# Patient Record
Sex: Male | Born: 1955 | Race: White | Marital: Married | Smoking: Former smoker
Health system: Southern US, Community
[De-identification: ages and names within clinical notes are randomized; demographics above are authoritative.]

## PROBLEM LIST (undated history)

## (undated) DIAGNOSIS — K219 Gastro-esophageal reflux disease without esophagitis: Secondary | ICD-10-CM

## (undated) DIAGNOSIS — F419 Anxiety disorder, unspecified: Secondary | ICD-10-CM

## (undated) DIAGNOSIS — I471 Supraventricular tachycardia, unspecified: Secondary | ICD-10-CM

## (undated) DIAGNOSIS — R0600 Dyspnea, unspecified: Secondary | ICD-10-CM

## (undated) DIAGNOSIS — D649 Anemia, unspecified: Secondary | ICD-10-CM

## (undated) DIAGNOSIS — I739 Peripheral vascular disease, unspecified: Secondary | ICD-10-CM

## (undated) DIAGNOSIS — I10 Essential (primary) hypertension: Secondary | ICD-10-CM

## (undated) DIAGNOSIS — D62 Acute posthemorrhagic anemia: Secondary | ICD-10-CM

## (undated) DIAGNOSIS — I509 Heart failure, unspecified: Secondary | ICD-10-CM

## (undated) DIAGNOSIS — T8611 Kidney transplant rejection: Secondary | ICD-10-CM

## (undated) DIAGNOSIS — G4730 Sleep apnea, unspecified: Secondary | ICD-10-CM

## (undated) DIAGNOSIS — Z951 Presence of aortocoronary bypass graft: Secondary | ICD-10-CM

## (undated) DIAGNOSIS — I779 Disorder of arteries and arterioles, unspecified: Secondary | ICD-10-CM

## (undated) DIAGNOSIS — N186 End stage renal disease: Secondary | ICD-10-CM

## (undated) DIAGNOSIS — G35 Multiple sclerosis: Secondary | ICD-10-CM

## (undated) DIAGNOSIS — J9610 Chronic respiratory failure, unspecified whether with hypoxia or hypercapnia: Secondary | ICD-10-CM

## (undated) DIAGNOSIS — I480 Paroxysmal atrial fibrillation: Secondary | ICD-10-CM

## (undated) DIAGNOSIS — I358 Other nonrheumatic aortic valve disorders: Secondary | ICD-10-CM

## (undated) DIAGNOSIS — I452 Bifascicular block: Secondary | ICD-10-CM

## (undated) DIAGNOSIS — C801 Malignant (primary) neoplasm, unspecified: Secondary | ICD-10-CM

## (undated) DIAGNOSIS — J449 Chronic obstructive pulmonary disease, unspecified: Secondary | ICD-10-CM

## (undated) DIAGNOSIS — J189 Pneumonia, unspecified organism: Secondary | ICD-10-CM

## (undated) DIAGNOSIS — Z9981 Dependence on supplemental oxygen: Secondary | ICD-10-CM

## (undated) DIAGNOSIS — I2510 Atherosclerotic heart disease of native coronary artery without angina pectoris: Secondary | ICD-10-CM

## (undated) DIAGNOSIS — I219 Acute myocardial infarction, unspecified: Secondary | ICD-10-CM

## (undated) DIAGNOSIS — T8612 Kidney transplant failure: Secondary | ICD-10-CM

## (undated) DIAGNOSIS — T148XXA Other injury of unspecified body region, initial encounter: Secondary | ICD-10-CM

## (undated) DIAGNOSIS — G4734 Idiopathic sleep related nonobstructive alveolar hypoventilation: Secondary | ICD-10-CM

## (undated) DIAGNOSIS — I38 Endocarditis, valve unspecified: Secondary | ICD-10-CM

## (undated) DIAGNOSIS — I4892 Unspecified atrial flutter: Secondary | ICD-10-CM

## (undated) HISTORY — DX: Peripheral vascular disease, unspecified: I73.9

## (undated) HISTORY — DX: Supraventricular tachycardia, unspecified: I47.10

## (undated) HISTORY — DX: Kidney transplant rejection: T86.11

## (undated) HISTORY — DX: Disorder of arteries and arterioles, unspecified: I77.9

## (undated) HISTORY — PX: AV FISTULA PLACEMENT: SHX1204

## (undated) HISTORY — DX: Unspecified atrial flutter: I48.92

## (undated) HISTORY — PX: KNEE SURGERY: SHX244

## (undated) HISTORY — DX: Paroxysmal atrial fibrillation: I48.0

## (undated) HISTORY — PX: COLONOSCOPY: SHX174

## (undated) HISTORY — DX: Anemia, unspecified: D64.9

## (undated) HISTORY — DX: Kidney transplant failure: T86.12

## (undated) HISTORY — PX: PARATHYROIDECTOMY: SHX19

## (undated) HISTORY — DX: Supraventricular tachycardia: I47.1

## (undated) HISTORY — PX: CORONARY ANGIOPLASTY: SHX604

## (undated) HISTORY — PX: APPENDECTOMY: SHX54

## (undated) HISTORY — PX: OTHER SURGICAL HISTORY: SHX169

## (undated) HISTORY — PX: ESOPHAGOGASTRODUODENOSCOPY ENDOSCOPY: SHX5814

## (undated) HISTORY — DX: Chronic respiratory failure, unspecified whether with hypoxia or hypercapnia: J96.10

## (undated) HISTORY — DX: Endocarditis, valve unspecified: I38

## (undated) HISTORY — PX: CORONARY ARTERY BYPASS GRAFT: SHX141

---

## 2004-09-06 HISTORY — PX: OTHER SURGICAL HISTORY: SHX169

## 2007-08-07 HISTORY — PX: KIDNEY TRANSPLANT: SHX239

## 2010-07-02 ENCOUNTER — Ambulatory Visit: Payer: Self-pay | Admitting: Vascular Surgery

## 2010-07-28 ENCOUNTER — Ambulatory Visit: Payer: Self-pay | Admitting: Vascular Surgery

## 2011-01-11 ENCOUNTER — Ambulatory Visit (HOSPITAL_COMMUNITY)
Admission: AD | Admit: 2011-01-11 | Discharge: 2011-01-11 | Disposition: A | Discharge status: Home / Self Care | Payer: Medicare Other | Source: Other Acute Inpatient Hospital | Attending: Vascular Surgery | Admitting: Vascular Surgery

## 2011-01-11 DIAGNOSIS — I509 Heart failure, unspecified: Secondary | ICD-10-CM | POA: Insufficient documentation

## 2011-01-11 DIAGNOSIS — J9600 Acute respiratory failure, unspecified whether with hypoxia or hypercapnia: Secondary | ICD-10-CM | POA: Insufficient documentation

## 2011-01-19 DIAGNOSIS — I501 Left ventricular failure: Secondary | ICD-10-CM | POA: Insufficient documentation

## 2011-01-19 NOTE — Consult Note (Signed)
NEW PATIENT CONSULTATION   [name], [name]  DOB:  09-08-1955                                       07/28/2010  IONGE#:[id number]   The patient presents today for concern regarding an open wound in his  right pretibial area.  He reports this has been present since August of  this year.  He initially struck in this pretibial area and developed an  open wound.  He subsequently has struck his shin somewhat above this  below his knee and does have a slight eschar but no open wound  currently.  He does not have any lower extremity claudication or rest  pain.  He has been treated at the [facility] for  this open area.  He.  Does have some mild stinging discomfort with this  but this is not severe.  He had been initially treated with oral  antibiotics for some surrounding erythema but is not on antibiotics  currently.  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  He dialyzes on Monday,  Wednesday, Friday via a left Cimino AV fistula.   PAST HISTORY:  Otherwise unremarkable.   SOCIAL HISTORY:  He is disabled, married with one 1 child.  He quit  smoking in 2000.  Does not drink alcohol.   FAMILY HISTORY:  Negative for premature atherosclerotic disease.   REVIEW OF SYSTEMS:  CONSTITUTIONAL:  Positive for no weight loss or  gain.  He weighs 195 pounds.  He is 5 feet 11 inches tall.  CARDIAC:  Positive for shortness breath with exertion.  PULMONARY:  He is on home oxygen.  URINARY:  For kidney failure.  His review of systems is otherwise negative except per the HPI.   PHYSICAL EXAMINATION:  A well-nourished, well-nourished white male  appearing stated age of 78, in no acute distress.  Heart rate is 53,  blood pressure is 112/53 in the right arm.  He has a fistula in his left  arm.  Temperature is 98.2, respirations 16.  HEENT:  Normal.  He has a 2+ right radial pulse.  He has an excellent  development of a fistula throughout his left forearm with no evidence of  skin breakdown.  MUSCULOSKELETAL:  No major deformities or cyanosis.  He does have  extensive surgery in his left knee.  SKIN:  Noted for hemosiderin deposits from past swelling in his left  calf.  He does have an open 2-cm ulceration the pretibial area above his  ankle with a good granulating base and no surrounding erythema.  NEUROLOGIC:  No focal weakness or paresthesias.  His pulse status is 2+  femoral, 2+ popliteal and 2+ dorsalis pedis bilaterally.   He had undergone noninvasive vascular studies in our office on July 02, 2010.  I reviewed this with the patient and his wife.  He does have  noncompressible vessels on the right with normal ABIs on the left.  He  does have triphasic waveforms bilaterally.  I explained to the patient  that he does have normal pulses and normal waveforms; therefore, I feel  that he certainly does have adequate flows for healing.  There is no  indication to proceed with arteriogram or other invasive evaluation  since he has essentially normal flow to the level of the tibial  vessels  in his foot.  I did explain with his chronic renal insufficiency that in  all likelihood has some diminished flow to the skin level and at that we  can expect slow healing for his wound but do not feel that any other  treatment is warranted.  He will continue his appropriate wound care as  is being done at [facility] and see [place] on a p.r.n. basis.     Larina Earthly, M.D.  Electronically Signed   TFE/MEDQ  D:  07/28/2010  T:  07/29/2010  Job:  [id number]   cc:   Loletha Carrow, MD  [facility]  [facility]

## 2011-07-08 HISTORY — PX: HERNIA REPAIR: SHX51

## 2012-02-12 ENCOUNTER — Emergency Department (HOSPITAL_COMMUNITY): Payer: Medicare Other

## 2012-02-12 ENCOUNTER — Encounter (HOSPITAL_COMMUNITY): Payer: Self-pay | Admitting: *Deleted

## 2012-02-12 ENCOUNTER — Inpatient Hospital Stay (HOSPITAL_COMMUNITY)
Admission: EM | Admit: 2012-02-12 | Discharge: 2012-02-15 | DRG: 871 | Disposition: A | Discharge status: Home / Self Care | Payer: Medicare Other | Attending: Family Medicine | Admitting: Family Medicine

## 2012-02-12 DIAGNOSIS — N2581 Secondary hyperparathyroidism of renal origin: Secondary | ICD-10-CM | POA: Diagnosis present

## 2012-02-12 DIAGNOSIS — I351 Nonrheumatic aortic (valve) insufficiency: Secondary | ICD-10-CM | POA: Diagnosis present

## 2012-02-12 DIAGNOSIS — N186 End stage renal disease: Secondary | ICD-10-CM

## 2012-02-12 DIAGNOSIS — I959 Hypotension, unspecified: Secondary | ICD-10-CM

## 2012-02-12 DIAGNOSIS — D696 Thrombocytopenia, unspecified: Secondary | ICD-10-CM | POA: Diagnosis present

## 2012-02-12 DIAGNOSIS — A419 Sepsis, unspecified organism: Principal | ICD-10-CM | POA: Diagnosis present

## 2012-02-12 DIAGNOSIS — E860 Dehydration: Secondary | ICD-10-CM | POA: Diagnosis present

## 2012-02-12 DIAGNOSIS — R197 Diarrhea, unspecified: Secondary | ICD-10-CM | POA: Diagnosis present

## 2012-02-12 DIAGNOSIS — T8611 Kidney transplant rejection: Secondary | ICD-10-CM

## 2012-02-12 DIAGNOSIS — R55 Syncope and collapse: Secondary | ICD-10-CM

## 2012-02-12 DIAGNOSIS — A0472 Enterocolitis due to Clostridium difficile, not specified as recurrent: Secondary | ICD-10-CM | POA: Diagnosis present

## 2012-02-12 DIAGNOSIS — G35D Multiple sclerosis, unspecified: Secondary | ICD-10-CM | POA: Diagnosis present

## 2012-02-12 DIAGNOSIS — A779 Spotted fever, unspecified: Secondary | ICD-10-CM | POA: Diagnosis present

## 2012-02-12 DIAGNOSIS — D649 Anemia, unspecified: Secondary | ICD-10-CM | POA: Diagnosis present

## 2012-02-12 DIAGNOSIS — I739 Peripheral vascular disease, unspecified: Secondary | ICD-10-CM | POA: Diagnosis present

## 2012-02-12 DIAGNOSIS — G4730 Sleep apnea, unspecified: Secondary | ICD-10-CM | POA: Diagnosis present

## 2012-02-12 DIAGNOSIS — I359 Nonrheumatic aortic valve disorder, unspecified: Secondary | ICD-10-CM

## 2012-02-12 DIAGNOSIS — Z7982 Long term (current) use of aspirin: Secondary | ICD-10-CM

## 2012-02-12 DIAGNOSIS — A413 Sepsis due to Hemophilus influenzae: Secondary | ICD-10-CM

## 2012-02-12 DIAGNOSIS — Z79899 Other long term (current) drug therapy: Secondary | ICD-10-CM

## 2012-02-12 DIAGNOSIS — R531 Weakness: Secondary | ICD-10-CM

## 2012-02-12 DIAGNOSIS — Z992 Dependence on renal dialysis: Secondary | ICD-10-CM | POA: Insufficient documentation

## 2012-02-12 DIAGNOSIS — I2510 Atherosclerotic heart disease of native coronary artery without angina pectoris: Secondary | ICD-10-CM | POA: Diagnosis present

## 2012-02-12 DIAGNOSIS — I4891 Unspecified atrial fibrillation: Secondary | ICD-10-CM | POA: Diagnosis present

## 2012-02-12 DIAGNOSIS — T8612 Kidney transplant failure: Secondary | ICD-10-CM | POA: Diagnosis present

## 2012-02-12 DIAGNOSIS — I120 Hypertensive chronic kidney disease with stage 5 chronic kidney disease or end stage renal disease: Secondary | ICD-10-CM | POA: Diagnosis present

## 2012-02-12 DIAGNOSIS — R509 Fever, unspecified: Secondary | ICD-10-CM | POA: Diagnosis present

## 2012-02-12 DIAGNOSIS — G35 Multiple sclerosis: Secondary | ICD-10-CM | POA: Diagnosis present

## 2012-02-12 HISTORY — DX: Multiple sclerosis: G35

## 2012-02-12 HISTORY — DX: Peripheral vascular disease, unspecified: I73.9

## 2012-02-12 HISTORY — DX: End stage renal disease: N18.6

## 2012-02-12 HISTORY — DX: Other nonrheumatic aortic valve disorders: I35.8

## 2012-02-12 HISTORY — DX: Sleep apnea, unspecified: G47.30

## 2012-02-12 HISTORY — DX: Atherosclerotic heart disease of native coronary artery without angina pectoris: I25.10

## 2012-02-12 LAB — COMPREHENSIVE METABOLIC PANEL
ALT: 55 U/L — ABNORMAL HIGH (ref 0–53)
AST: 50 U/L — ABNORMAL HIGH (ref 0–37)
Albumin: 2.9 g/dL — ABNORMAL LOW (ref 3.5–5.2)
Alkaline Phosphatase: 64 U/L (ref 39–117)
BUN: 58 mg/dL — ABNORMAL HIGH (ref 6–23)
Chloride: 91 mEq/L — ABNORMAL LOW (ref 96–112)
Potassium: 4 mEq/L (ref 3.5–5.1)
Sodium: 136 mEq/L (ref 135–145)
Total Bilirubin: 0.3 mg/dL (ref 0.3–1.2)

## 2012-02-12 LAB — POCT I-STAT, CHEM 8
BUN: 57 mg/dL — ABNORMAL HIGH (ref 6–23)
Calcium, Ion: 0.95 mmol/L — ABNORMAL LOW (ref 1.12–1.32)
Chloride: 98 mEq/L (ref 96–112)
Creatinine, Ser: 8.4 mg/dL — ABNORMAL HIGH (ref 0.50–1.35)
TCO2: 27 mmol/L (ref 0–100)

## 2012-02-12 LAB — DIFFERENTIAL
Basophils Absolute: 0 10*3/uL (ref 0.0–0.1)
Basophils Relative: 0 % (ref 0–1)
Monocytes Relative: 7 % (ref 3–12)
Neutro Abs: 9.7 10*3/uL — ABNORMAL HIGH (ref 1.7–7.7)
Neutrophils Relative %: 82 % — ABNORMAL HIGH (ref 43–77)

## 2012-02-12 LAB — CBC
Hemoglobin: 12.5 g/dL — ABNORMAL LOW (ref 13.0–17.0)
MCHC: 33.9 g/dL (ref 30.0–36.0)

## 2012-02-12 LAB — POCT I-STAT TROPONIN I

## 2012-02-12 MED ORDER — DEXTROSE 5 % IV SOLN
1.0000 g | Freq: Two times a day (BID) | INTRAVENOUS | Status: DC
  Administered 2012-02-12 – 2012-02-13 (×3): 1 g via INTRAVENOUS
  Filled 2012-02-12 (×6): qty 1

## 2012-02-12 MED ORDER — VANCOMYCIN HCL IN DEXTROSE 1-5 GM/200ML-% IV SOLN
1000.0000 mg | Freq: Once | INTRAVENOUS | Status: AC
  Administered 2012-02-12: 1000 mg via INTRAVENOUS
  Filled 2012-02-12: qty 200

## 2012-02-12 MED ORDER — SODIUM CHLORIDE 0.9 % IV BOLUS (SEPSIS)
1000.0000 mL | Freq: Once | INTRAVENOUS | Status: AC
  Administered 2012-02-12: 1000 mL via INTRAVENOUS

## 2012-02-12 MED ORDER — DIPHENHYDRAMINE HCL 25 MG PO CAPS
25.0000 mg | ORAL_CAPSULE | Freq: Once | ORAL | Status: AC
  Administered 2012-02-12: 25 mg via ORAL
  Filled 2012-02-12: qty 1

## 2012-02-12 NOTE — ED Provider Notes (Signed)
History     CSN: [id number]  Arrival date & time 02/12/12  1535   First MD Initiated Contact with Patient 02/12/12 [phone]      Chief Complaint  Patient presents with  . Fever  . Neck Pain     Patient is a 56 y.o. male presenting with fever and weakness. The history is provided by the patient and the spouse.  Fever Primary symptoms of the febrile illness include fever, headaches and cough. Primary symptoms do not include wheezing, shortness of breath, abdominal pain, nausea, vomiting, diarrhea, dysuria or rash. The current episode started 6 to 7 days ago. This is a new problem. The problem has not changed since onset. The fever began 6 to 7 days ago (subjective; afebrile here). The maximum temperature recorded prior to his arrival was unknown.  The headache began today. The headache is not associated with neck stiffness or weakness.  Risk factors: renal transplant; however, kidney has failed and pt is on dialysis. Weakness The primary symptoms include headaches and fever. Primary symptoms do not include seizures, dizziness, nausea or vomiting. The symptoms began 5 to 7 days ago. The symptoms are worsening. The neurological symptoms are diffuse. Context: worse since dialysis on Wednesday.  The headache is not associated with neck stiffness or weakness.  Additional symptoms do not include neck stiffness or weakness.    Past Medical History  Diagnosis Date  . Renal failure   . Multiple sclerosis   . Atrial fibrillation   . CAD (coronary artery disease)   . Aortic heart valve prolapse     Past Surgical History  Procedure Date  . Av fistula placement   . Flash     flash pulmonary edema    No family history on file.  History  Substance Use Topics  . Smoking status: Not on file  . Smokeless tobacco: Not on file  . Alcohol Use: No      Review of Systems  Constitutional: Positive for fever and chills. Negative for diaphoresis, activity change and appetite change.  HENT:  Negative for neck pain and neck stiffness.   Respiratory: Positive for cough. Negative for chest tightness, shortness of breath and wheezing.   Cardiovascular: Negative for chest pain, palpitations and leg swelling.  Gastrointestinal: Negative for nausea, vomiting, abdominal pain, diarrhea and constipation.  Genitourinary: Positive for difficulty urinating (at baseline). Negative for dysuria, frequency, hematuria and flank pain.  Skin: Negative for rash and wound.  Neurological: Positive for headaches. Negative for dizziness, seizures, syncope, weakness, light-headedness and numbness.  Psychiatric/Behavioral: Negative for behavioral problems, confusion, decreased concentration and agitation. The patient is not nervous/anxious.   All other systems reviewed and are negative.    Allergies  Penicillins and Adhesive  Home Medications   Current Outpatient Rx  Name Route Sig Dispense Refill  . ASPIRIN EC 81 MG PO TBEC Oral Take 81 mg by mouth daily.    Marland Kitchen NEPHRO-VITE 0.8 MG PO TABS Oral Take 0.8 mg by mouth at bedtime.    Marland Kitchen CALCIUM ACETATE 667 MG PO CAPS Oral Take 2,001 mg by mouth 3 (three) times daily with meals.    Marland Kitchen CITALOPRAM HYDROBROMIDE 20 MG PO TABS Oral Take 20 mg by mouth daily.    Marland Kitchen DOCUSATE SODIUM 100 MG PO CAPS Oral Take 400 mg by mouth 2 (two) times daily.    Marland Kitchen FOLIC ACID 1 MG PO TABS Oral Take 1 mg by mouth daily.    Marland Kitchen GABAPENTIN 300 MG PO CAPS Oral Take  300 mg by mouth every hemodialysis. Take on Monday, Wednesday, and Friday    . LANTHANUM CARBONATE 1000 MG PO CHEW Oral Chew 2,000 mg by mouth 2 (two) times daily with a meal.    . LEVOFLOXACIN 500 MG PO TABS Oral Take 500 mg by mouth daily.    Marland Kitchen METOPROLOL TARTRATE 50 MG PO TABS Oral Take 50 mg by mouth 2 (two) times daily.    Marland Kitchen OMEPRAZOLE MAGNESIUM 20 MG PO TBEC Oral Take 20 mg by mouth daily.    Marland Kitchen SIMVASTATIN 20 MG PO TABS Oral Take 10 mg by mouth daily.      BP 82/38  Pulse 92  Temp(Src) 99.1 F (37.3 C) (Oral)  Resp  20  SpO2 97%  Physical Exam  Nursing note and vitals reviewed. Constitutional: He appears well-developed and well-nourished.  HENT:  Head: Normocephalic and atraumatic.  Right Ear: External ear normal.  Left Ear: External ear normal.  Nose: Nose normal.  Mouth/Throat: Oropharynx is clear and moist. No oropharyngeal exudate.  Eyes: Conjunctivae and EOM are normal. Pupils are equal, round, and reactive to light.  Neck: Normal range of motion. Neck supple.       Negative Kernig Sign   Cardiovascular: Normal rate, regular rhythm, normal heart sounds and intact distal pulses.   Pulmonary/Chest: Effort normal and breath sounds normal. No respiratory distress. He has no wheezes. He has no rales. He exhibits no tenderness.  Abdominal: Soft. Bowel sounds are normal. He exhibits distension (diffusely). He exhibits no mass. There is tenderness (diffusely). There is no rebound and no guarding.  Musculoskeletal: Normal range of motion. He exhibits no edema and no tenderness.       AV fistula overlying left upper extremity   Neurological: He is alert. He displays normal reflexes. No cranial nerve deficit. He exhibits normal muscle tone. Coordination normal.  Skin: Skin is warm and dry. No rash noted. No erythema. No pallor.  Psychiatric: He has a normal mood and affect. His behavior is normal. Judgment and thought content normal.    ED Course  Procedures (including critical care time)  Labs Reviewed  CBC - Abnormal; Notable for the following:    WBC 11.7 (*)    RBC 3.76 (*)    Hemoglobin 12.5 (*)    HCT 36.9 (*)    Platelets 127 (*)    All other components within normal limits  DIFFERENTIAL - Abnormal; Notable for the following:    Neutrophils Relative 82 (*)    Neutro Abs 9.7 (*)    Lymphocytes Relative 10 (*)    All other components within normal limits  COMPREHENSIVE METABOLIC PANEL - Abnormal; Notable for the following:    Chloride 91 (*)    Glucose, Bld 108 (*)    BUN 58 (*)     Creatinine, Ser 8.53 (*)    Albumin 2.9 (*)    AST 50 (*) HEMOLYSIS AT THIS LEVEL MAY AFFECT RESULT   ALT 55 (*)    GFR calc non Af Amer 6 (*)    GFR calc Af Amer 7 (*)    All other components within normal limits  LACTIC ACID, PLASMA - Abnormal; Notable for the following:    Lactic Acid, Venous 2.9 (*)    All other components within normal limits  POCT I-STAT, CHEM 8 - Abnormal; Notable for the following:    BUN 57 (*)    Creatinine, Ser 8.40 (*)    Glucose, Bld 107 (*)    Calcium,  Ion 0.95 (*)    All other components within normal limits  PROCALCITONIN  POCT I-STAT TROPONIN I  CULTURE, BLOOD (ROUTINE X 2)  CULTURE, BLOOD (ROUTINE X 2)  URINALYSIS, ROUTINE W REFLEX MICROSCOPIC  URINE CULTURE   Ct Abdomen Pelvis Wo Contrast  02/12/2012  *RADIOLOGY REPORT*  Clinical Data: Abdominal distension, weakness, history of renal transplant  CT ABDOMEN AND PELVIS WITHOUT CONTRAST  Technique:  Multidetector CT imaging of the abdomen and pelvis was performed following the standard protocol without intravenous contrast.  Comparison: None.  Findings: Linear scarring versus atelectasis at the left lung base.  Unenhanced liver, spleen, pancreas, and adrenal glands within normal limits.  Gallbladder is underdistended.  No intrahepatic or extrahepatic ductal dilatation.  Bilateral native renal atrophy.  Multiple bilateral probable renal cysts, most of which are too small to characterize.  No hydronephrosis.  Right lower quadrant renal transplant.  Mild surrounding nonspecific perinephric stranding.  No hydronephrosis.  No evidence of bowel obstruction.  Colonic diverticulosis, without associated inflammatory changes.  Atherosclerotic calcifications of the abdominal aorta and branch vessels.  No abdominopelvic ascites.  No suspicious abdominopelvic lymphadenopathy.  Prostate is unremarkable.  No ureteral or bladder calculi.  Bladder is underdistended.  Laxity of the lower abdominal wall.  Suspected incisional  hernia defect along the right lateral abdominal wall containing a loop of nondilated colon (series 2/image 44).  Degenerative changes of the visualized thoracolumbar spine.  IMPRESSION: No evidence of bowel obstruction.  Right lower quadrant renal transplant.  No hydronephrosis.  No CT findings to account for the patient's abdominal symptoms.  Original Report Authenticated By: Charline Bills, M.D.   Dg Chest 2 View  02/12/2012  *RADIOLOGY REPORT*  Clinical Data: Fever, body aches  CHEST - 2 VIEW  Comparison: 12/06/2011  Findings: Lungs are essentially clear. No pleural effusion or pneumothorax.  The heart is top normal in size.  Degenerative changes of the visualized thoracolumbar spine.  IMPRESSION: No evidence of acute cardiopulmonary disease.  Original Report Authenticated By: Charline Bills, M.D.     1. Weakness generalized   2. Sepsis   3. End stage renal disease on dialysis      Date: 02/12/2012  Rate: 86 bpm  Rhythm: normal sinus rhythm  QRS Axis: normal  Intervals: normal  ST/T Wave abnormalities: nonspecific ST changes  Conduction Disutrbances:none  Narrative Interpretation: T wave inversions in inferior and lateral leads; no old EKG for comparison.   Old EKG Reviewed: none available     MDM  56 yo M w/hx of ESRD (on dialysis) presents for 1 week of gradually worsening fever/chills, headache, cough, abdominal pain/distention, and generalized weakness. His weakness worsened to a point in which he felt pre-syncopal today. TTP overlying abdomen diffusely. No focal neuro deficits and patient not altered; adequate range of motion of neck. Clinical picture not c/w meningitis or encephalitis; LP not indicated. Afebrile but patient hypotensive on arrival; IVF bolused with adequate response. CXR not c/w pneumonia. Pt makes little urine occasionally; abdominal/pelvic CT obtained without contrast that is negative for intra-abdominal abscess.  Labs consistent with dehydration.   Clinical  picture c/w severe sepsis; source is likely bloodstream secondary to dialysis. Blood cultures obtained and Vanc/Zosyn empirically administered. Internal medicine consulted and request evaluation by Critical Care team which evaluated patient in ED and agreed patient does not require ICU level care at this time. Hospitalist team consulted and will admit to step-down unit.         Clemetine Marker, MD 02/13/12 [phone]

## 2012-02-12 NOTE — ED Notes (Signed)
Pt complaining  of itching/rash all  over  his body(back,trunk, and upper and lower limbs).Vancomycin infusion paused and informed ED doctor.

## 2012-02-12 NOTE — H&P (Signed)
PCP:  Dina Rich in [place] Renal: Daphine Deutscher Web Cardiologist: Andrey Campanile at [facility] Complaint:   Fever fatigue  HPI: [name] is a 56 y.o. male   has a past medical history of Renal failure; Multiple sclerosis; Atrial fibrillation; CAD (coronary artery disease); and Aortic heart valve prolapse.   Presented with  Low grade fever since Wednesday 3 days ago, headache, stiff neck on left side only. Muscle aches. Fatigue no appetite. On Friday he was seen by his PCP and was started on Levaquin he was instructed to go to [facility]. Cultures were obtained at [facility].  Currently He feels better, But now started to have diarrhea and noted to have low sbp down to 90's. His regular SBP is about 110's.  Today he fell very light headed when he tried to stand up and syncopized hitting his head and chest. He has pets and [place] but no visible tick bites. No travel hx. No rashes. No nausea or vomiting. No meningismus. Of note April he was admitted and intubated for flush pulmonary edema. This was at [facility] he have undergone extensive cardiac workup there included an echo as well as catheterization showed mild coronary artery disease. He was also told that he needs at some point to replace his aortic valve.   Review of Systems:    Pertinent positives include: Fevers, chills, fatigue,weight loss   Constitutional:  No weight loss, night sweats HEENT:  No headaches, Difficulty swallowing,Tooth/dental problems,Sore throat,  No sneezing, itching, ear ache, nasal congestion, post nasal drip,  Cardio-vascular:  No chest pain, Orthopnea, PND, anasarca, dizziness, palpitations.no Bilateral lower extremity swelling  GI:  No heartburn, indigestion, abdominal pain, nausea, vomiting, diarrhea, change in bowel habits, loss of appetite, melena, blood in stool, hematemesis Resp:  no shortness of breath at rest. No dyspnea on exertion, No excess mucus, no productive cough, No  non-productive cough, No coughing up of blood.No change in color of mucus.No wheezing. Skin:  no rash or lesions. No jaundice GU:  no dysuria, change in color of urine, no urgency or frequency. No straining to urinate.  No flank pain.  Musculoskeletal:  No joint pain or no joint swelling. No decreased range of motion. No back pain.  Psych:  No change in mood or affect. No depression or anxiety. No memory loss.  Neuro: no localizing neurological complaints, no tingling, no weakness, no double vision, no gait abnormality, no slurred speech, no confusion  Otherwise ROS are negative except for above, 10 systems were reviewed  Past Medical History: Past Medical History  Diagnosis Date  . Renal failure   . Multiple sclerosis   . Atrial fibrillation   . CAD (coronary artery disease)   . Aortic heart valve prolapse    Past Surgical History  Procedure Date  . Av fistula placement   . Flash     flash pulmonary edema     Medications: Prior to Admission medications   Medication Sig Start Date End Date Taking? Authorizing Provider  aspirin EC 81 MG tablet Take 81 mg by mouth daily.   Yes Historical Provider, MD  b complex-vitamin c-folic acid (NEPHRO-VITE) 0.8 MG TABS Take 0.8 mg by mouth daily.    Yes Historical Provider, MD  calcium acetate (PHOSLO) 667 MG capsule Take 2,001 mg by mouth 3 (three) times daily with meals.   Yes Historical Provider, MD  citalopram (CELEXA) 20 MG tablet Take 20 mg by mouth daily.   Yes  Historical Provider, MD  docusate sodium (COLACE) 100 MG capsule Take 400 mg by mouth 2 (two) times daily.   Yes Historical Provider, MD  folic acid (FOLVITE) 1 MG tablet Take 1 mg by mouth daily.   Yes Historical Provider, MD  gabapentin (NEURONTIN) 300 MG capsule Take 300 mg by mouth every hemodialysis. Take on Monday, Wednesday, and Friday   Yes Historical Provider, MD  lanthanum (FOSRENOL) 1000 MG chewable tablet Chew 2,000 mg by mouth 2 (two) times daily with a meal.    Yes Historical Provider, MD  levofloxacin (LEVAQUIN) 500 MG tablet Take 500 mg by mouth daily. For 10 days. Started on Friday 6/7 02/11/12 02/21/12 Yes Historical Provider, MD  metoprolol (LOPRESSOR) 50 MG tablet Take 50 mg by mouth 2 (two) times daily.   Yes Historical Provider, MD  omeprazole (PRILOSEC OTC) 20 MG tablet Take 20 mg by mouth daily.   Yes Historical Provider, MD  simvastatin (ZOCOR) 20 MG tablet Take 10 mg by mouth daily.   Yes Historical Provider, MD    Allergies:   Allergies  Allergen Reactions  . Penicillins Other (See Comments)    migraine  . Adhesive (Tape) Rash    Please use paper tape    Social History:  Ambulatory  independently Lives at  Home with wife   reports that he has quit smoking. He does not have any smokeless tobacco history on file. He reports that he does not drink alcohol or use illicit drugs.   Family History: family history is not on file.    Physical Exam: Patient Vitals for the past 24 hrs:  BP Temp Temp src Pulse Resp SpO2  02/12/12 2130 103/35 mmHg - - 101  25  97 %  02/12/12 1908 106/59 mmHg - - 75  18  -  02/12/12 1639 85/29 mmHg 100.3 F (37.9 C) Rectal 89  18  99 %  02/12/12 1547 82/38 mmHg 99.1 F (37.3 C) Oral 92  20  97 %    1. General:  in No Acute distress 2. Psychological: Alert and  Oriented 3. Head/ENT:   Dry Mucous Membranes                          Head Non traumatic, neck supple there is an area of muscle spasm on the left which improves with manipulation                          Normal Dentition 4. SKIN:  decreased Skin turgor,  Skin clean Dry and intact no rash 5. Heart: Regular rate and rhythm mild Murmur, no Rub or gallop 6. Lungs: Clear to auscultation bilaterally, no wheezes or crackles   7. Abdomen: Soft, non-tender, Non distended large abdominal hernia noted near scar area easily reducible and nontender 8. Lower extremities: no clubbing, cyanosis, or edema evidence of chronic venous stasis 9.  Neurologically Grossly intact, moving all 4 extremities equally no meningismus 10. MSK: Normal range of motion  body mass index is unknown because there is no height or weight on file.   Labs on Admission:   [facility] 02/12/12 1642 02/12/12 1613  NA 137 136  K 3.9 4.0  CL 98 91*  CO2 -- 23  GLUCOSE 107* 108*  BUN 57* 58*  CREATININE 8.40* 8.53*  CALCIUM -- 8.4  MG -- --  PHOS -- --    Basename 02/12/12 1613  AST 50*  ALT  55*  ALKPHOS 64  BILITOT 0.3  PROT 7.6  ALBUMIN 2.9*   No results found for this basename: LIPASE:2,AMYLASE:2 in the last 72 hours  Basename 02/12/12 1642 02/12/12 1613  WBC -- 11.7*  NEUTROABS -- 9.7*  HGB 13.6 12.5*  HCT 40.0 36.9*  MCV -- 98.1  PLT -- 127*   No results found for this basename: CKTOTAL:3,CKMB:3,CKMBINDEX:3,TROPONINI:3 in the last 72 hours No results found for this basename: TSH,T4TOTAL,FREET3,T3FREE,THYROIDAB in the last 72 hours No results found for this basename: VITAMINB12:2,FOLATE:2,FERRITIN:2,TIBC:2,IRON:2,RETICCTPCT:2 in the last 72 hours No results found for this basename: HGBA1C    CrCl is unknown because there is no height on file for the current visit. ABG    Component Value Date/Time   TCO2 27 02/12/2012 1642     No results found for this basename: DDIMER     Other results:  I have pearsonaly reviewed this: ECG REPORT  Rate: 86  Rhythm: Sinus rhythm ST&T Change: Diffuse T-wave inversion. No ST elevation   Cultures: No results found for this basename: sdes, specrequest, cult, reptstatus       Radiological Exams on Admission: Ct Abdomen Pelvis Wo Contrast  02/12/2012  *RADIOLOGY REPORT*  Clinical Data: Abdominal distension, weakness, history of renal transplant  CT ABDOMEN AND PELVIS WITHOUT CONTRAST  Technique:  Multidetector CT imaging of the abdomen and pelvis was performed following the standard protocol without intravenous contrast.  Comparison: None.  Findings: Linear scarring versus atelectasis at  the left lung base.  Unenhanced liver, spleen, pancreas, and adrenal glands within normal limits.  Gallbladder is underdistended.  No intrahepatic or extrahepatic ductal dilatation.  Bilateral native renal atrophy.  Multiple bilateral probable renal cysts, most of which are too small to characterize.  No hydronephrosis.  Right lower quadrant renal transplant.  Mild surrounding nonspecific perinephric stranding.  No hydronephrosis.  No evidence of bowel obstruction.  Colonic diverticulosis, without associated inflammatory changes.  Atherosclerotic calcifications of the abdominal aorta and branch vessels.  No abdominopelvic ascites.  No suspicious abdominopelvic lymphadenopathy.  Prostate is unremarkable.  No ureteral or bladder calculi.  Bladder is underdistended.  Laxity of the lower abdominal wall.  Suspected incisional hernia defect along the right lateral abdominal wall containing a loop of nondilated colon (series 2/image 44).  Degenerative changes of the visualized thoracolumbar spine.  IMPRESSION: No evidence of bowel obstruction.  Right lower quadrant renal transplant.  No hydronephrosis.  No CT findings to account for the patient's abdominal symptoms.  Original Report Authenticated By: Charline Bills, M.D.   Dg Chest 2 View  02/12/2012  *RADIOLOGY REPORT*  Clinical Data: Fever, body aches  CHEST - 2 VIEW  Comparison: 12/06/2011  Findings: Lungs are essentially clear. No pleural effusion or pneumothorax.  The heart is top normal in size.  Degenerative changes of the visualized thoracolumbar spine.  IMPRESSION: No evidence of acute cardiopulmonary disease.  Original Report Authenticated By: Charline Bills, M.D.    Assessment/Plan  This is a 56 year old gentleman with complicated medical history including end-stage renal disease, multiple sclerosis coronary artery disease, and aortic valve disease  Present on Admission:  .ESRD (end stage renal disease) - patient last dialysis was normal and was on  Friday he is currently has no acute hemodialysis indications, would consult renal in a.m. also negative patient decompensates  .Fever - possible sepsis will start on broad-spectrum antibiotics including Zosyn and vancomycin. Given that I cannot rule out tickborne illness will also initiate doxycycline and obtain RMSF and ehrichia serologies. Give gentle IV  hydration being mindful of patient being end-stage renal watch him and step down. CCM is aware they have seen the patient in consult and continue to watch him through e-link if possible.   .Hypotension - possible sepsis we'll continue to monitor currently improved with IV fluids  .Diarrhea - obtain stool cultures C. difficile PCR  .Syncope and collapse - patient have had recent cardiac workup done at [facility] would need to obtain records to make sure we have not duplicated it. Syncope likely occurred in the setting of hypotension.  .Aortic valve disease - will need to obtain records from [facility]   Prophylaxis: SCD , Protonix  CODE STATUS: FULL CODE  Other plan as per orders.  I have spent a total of 65 min on this admission  Aribella Vavra 02/12/2012, 11:18 PM

## 2012-02-12 NOTE — ED Notes (Signed)
Pt states they do not make urine

## 2012-02-12 NOTE — ED Notes (Signed)
S/s week ago. Last Sunday: chills; better Monday and Tuesday; Wednesday - went to dialysis and s/s started back that evening. Inc. Belly distention, weakness, feels better sitting while hypotensive. When he stood today he fell.

## 2012-02-12 NOTE — Consult Note (Signed)
Name: [name] MRN: [id number] DOB: 1956/06/28    LOS: 0  Referring Provider:  Dr. Manus Gunning, Emergency Medicine Reason for Referral:  Hypotension  PULMONARY / CRITICAL CARE MEDICINE  HPI:  56 yo M with AI/AR, afib, ESRD s/p failed transplant who was brought to the ED with hypotension.  For the past week he has felt poorly with fatigue and malaise.  Several days ago he developed a fever. On Thursday he was sent to a local ED for blood cultures and yesterday his PCP started him on levaquin at home.  He was feeling better overall today however felt presyncopal when standing up and had one episode of syncope.  He has had headaches with this illness.  He has also had watery diarrhea at least several times per day for the week.  He has had poor po intake during this time.  He denies SOB, CP, cough, nausea, or vomiting.  On arrival to the ED he had SBPs in the 80s.  He was given vancomycin and cefepime.  He has received 2 L NS with improvement to SBP of ~ 100 which he reports is near his baseline.  Past Medical History  Diagnosis Date  . Renal failure   . Multiple sclerosis   . Atrial fibrillation   . CAD (coronary artery disease)   . Aortic heart valve prolapse    Past Surgical History  Procedure Date  . Av fistula placement   . Flash     flash pulmonary edema   Prior to Admission medications   Medication Sig Start Date End Date Taking? Authorizing Provider  aspirin EC 81 MG tablet Take 81 mg by mouth daily.   Yes Historical Provider, MD  b complex-vitamin c-folic acid (NEPHRO-VITE) 0.8 MG TABS Take 0.8 mg by mouth daily.    Yes Historical Provider, MD  calcium acetate (PHOSLO) 667 MG capsule Take 2,001 mg by mouth 3 (three) times daily with meals.   Yes Historical Provider, MD  citalopram (CELEXA) 20 MG tablet Take 20 mg by mouth daily.   Yes Historical Provider, MD  docusate sodium (COLACE) 100 MG capsule Take 400 mg by mouth 2 (two) times daily.   Yes Historical Provider, MD  folic  acid (FOLVITE) 1 MG tablet Take 1 mg by mouth daily.   Yes Historical Provider, MD  gabapentin (NEURONTIN) 300 MG capsule Take 300 mg by mouth every hemodialysis. Take on Monday, Wednesday, and Friday   Yes Historical Provider, MD  lanthanum (FOSRENOL) 1000 MG chewable tablet Chew 2,000 mg by mouth 2 (two) times daily with a meal.   Yes Historical Provider, MD  levofloxacin (LEVAQUIN) 500 MG tablet Take 500 mg by mouth daily. For 10 days. Started on Friday 6/7 02/11/12 02/21/12 Yes Historical Provider, MD  metoprolol (LOPRESSOR) 50 MG tablet Take 50 mg by mouth 2 (two) times daily.   Yes Historical Provider, MD  omeprazole (PRILOSEC OTC) 20 MG tablet Take 20 mg by mouth daily.   Yes Historical Provider, MD  simvastatin (ZOCOR) 20 MG tablet Take 10 mg by mouth daily.   Yes Historical Provider, MD   Allergies Allergies  Allergen Reactions  . Penicillins Other (See Comments)    migraine  . Adhesive (Tape) Rash    Please use paper tape    Family History No family history on file. Social History  does not have a smoking history on file. He does not have any smokeless tobacco history on file. He reports that he does not drink alcohol or  use illicit drugs.  Review Of Systems:  10 point ROS negative except as listed in HPI  Brief Patient Description: 56 yo M with afib, aortic insufficiency, ESRD s/p failed transplant with fever and hypotension.  Current Status: Stable  Vital Signs: Temp:  [99.1 F (37.3 C)-100.3 F (37.9 C)] 100.3 F (37.9 C) (06/08 1639) Pulse Rate:  [75-101] 101  (06/08 2130) Resp:  [18-25] 25  (06/08 2130) BP: (82-106)/(29-59) 103/35 mmHg (06/08 2130) SpO2:  [97 %-99 %] 97 % (06/08 2130)  Physical Examination: General:  NAD Neuro:  A&Ox3. No obvious focal deficits.  HEENT:  Sclera clear. MMM, EOMI. Neck: Supple, no adenopathy. Cardiovascular:  RRR, + systolic murmur Lungs:  CTAB Abdomen:  Soft Musculoskeletal:  R knee with palpable screw Skin:  Venous stasis  changes in LEs  Active Problems:  * No active hospital problems. *    ASSESSMENT AND PLAN   CARDIOVASCULAR  Lab 02/12/12 1613  TROPONINI --  LATICACIDVEN 2.9*  PROBNP --    A: Hypotension possibly 2/2 sepsis given fevers and/or hypovolemia given diarrhea and poor po intake.  Wide pulse pressure may be due to chronic aortic regurgitation. P:  - Appears fluid responsive with normalization of BP after 2 L NS. - Recommend cautious hydration given anuric state and h/o pulmonary edema.  RENAL  Lab 02/12/12 1642 02/12/12 1613  NA 137 136  K 3.9 4.0  CL 98 91*  CO2 -- 23  BUN 57* 58*  CREATININE 8.40* 8.53*  CALCIUM -- 8.4  MG -- --  PHOS -- --   Intake/Output    None     A:  ESRD with previous failed transplant P:   - No acute indication for HD - Will need nephrology consult in AM.   INFECTIOUS  Lab 02/12/12 1613  WBC 11.7*  PROCALCITON 9.80   Cultures: Bloodx2 6/8 >>> Antibiotics: Vancomycin and Cefepime in the ED  A:  Concern for sepsis with unclear source P:   - Agree with broad spectrum antimicrobials while awaiting culture results. - Would f/u cultures collected at [facility] - Given persistent diarrhea will get cdiff and stool culture/O&Ps   BEST PRACTICE / DISPOSITION Level of Care:  Floor/Stepdown would be reasonable given clinical appearance and rapid improvement with IVF.  Please contact CCM if clinic situation changes.   Rylei Masella, M.D. Pulmonary and Critical Care Medicine [facility] Pager: [phone]  CCM time 35 minutes  02/12/2012, 9:53 PM

## 2012-02-13 ENCOUNTER — Encounter (HOSPITAL_COMMUNITY): Payer: Self-pay | Admitting: *Deleted

## 2012-02-13 DIAGNOSIS — A413 Sepsis due to Hemophilus influenzae: Secondary | ICD-10-CM

## 2012-02-13 DIAGNOSIS — R55 Syncope and collapse: Secondary | ICD-10-CM

## 2012-02-13 DIAGNOSIS — N186 End stage renal disease: Secondary | ICD-10-CM

## 2012-02-13 DIAGNOSIS — I959 Hypotension, unspecified: Secondary | ICD-10-CM

## 2012-02-13 LAB — CBC
HCT: 31.5 % — ABNORMAL LOW (ref 39.0–52.0)
Hemoglobin: 10.8 g/dL — ABNORMAL LOW (ref 13.0–17.0)
MCV: 97.8 fL (ref 78.0–100.0)
RBC: 3.22 MIL/uL — ABNORMAL LOW (ref 4.22–5.81)
WBC: 10.2 10*3/uL (ref 4.0–10.5)

## 2012-02-13 LAB — COMPREHENSIVE METABOLIC PANEL
Albumin: 2.6 g/dL — ABNORMAL LOW (ref 3.5–5.2)
Alkaline Phosphatase: 56 U/L (ref 39–117)
BUN: 69 mg/dL — ABNORMAL HIGH (ref 6–23)
Calcium: 7.8 mg/dL — ABNORMAL LOW (ref 8.4–10.5)
Potassium: 4 mEq/L (ref 3.5–5.1)
Total Protein: 6.8 g/dL (ref 6.0–8.3)

## 2012-02-13 LAB — PHOSPHORUS: Phosphorus: 5.4 mg/dL — ABNORMAL HIGH (ref 2.3–4.6)

## 2012-02-13 LAB — CARDIAC PANEL(CRET KIN+CKTOT+MB+TROPI)
CK, MB: 1.6 ng/mL (ref 0.3–4.0)
CK, MB: 1.9 ng/mL (ref 0.3–4.0)
Relative Index: INVALID (ref 0.0–2.5)
Relative Index: INVALID (ref 0.0–2.5)
Total CK: 53 U/L (ref 7–232)
Troponin I: <0.3 ng/mL (ref <0.30)
Troponin I: <0.3 ng/mL (ref <0.30)

## 2012-02-13 LAB — MRSA PCR SCREENING: MRSA by PCR: NEGATIVE

## 2012-02-13 LAB — MAGNESIUM: Magnesium: 1.8 mg/dL (ref 1.5–2.5)

## 2012-02-13 MED ORDER — ALBUTEROL SULFATE (5 MG/ML) 0.5% IN NEBU: 2.5000 mg | INHALATION_SOLUTION | RESPIRATORY_TRACT | Status: DC | PRN

## 2012-02-13 MED ORDER — ACETAMINOPHEN 650 MG RE SUPP: 650.0000 mg | Freq: Four times a day (QID) | RECTAL | Status: DC | PRN

## 2012-02-13 MED ORDER — DOCUSATE SODIUM 100 MG PO CAPS: 100.0000 mg | ORAL_CAPSULE | Freq: Two times a day (BID) | ORAL | Status: DC

## 2012-02-13 MED ORDER — ASPIRIN EC 81 MG PO TBEC
81.0000 mg | DELAYED_RELEASE_TABLET | Freq: Every day | ORAL | Status: DC
Start: 2012-02-13 — End: 2012-02-15
  Administered 2012-02-13 – 2012-02-15 (×3): 81 mg via ORAL
  Filled 2012-02-13 (×3): qty 1

## 2012-02-13 MED ORDER — FOLIC ACID 1 MG PO TABS
1.0000 mg | ORAL_TABLET | Freq: Every day | ORAL | Status: DC
  Administered 2012-02-13 – 2012-02-15 (×3): 1 mg via ORAL
  Filled 2012-02-13 (×3): qty 1

## 2012-02-13 MED ORDER — ACETAMINOPHEN 325 MG PO TABS
650.0000 mg | ORAL_TABLET | Freq: Four times a day (QID) | ORAL | Status: DC | PRN
  Administered 2012-02-13 – 2012-02-14 (×3): 650 mg via ORAL
  Filled 2012-02-13 (×3): qty 2

## 2012-02-13 MED ORDER — SODIUM CHLORIDE 0.9 % IV SOLN
INTRAVENOUS | Status: DC
  Administered 2012-02-13: 06:00:00 via INTRAVENOUS

## 2012-02-13 MED ORDER — RENA-VITE PO TABS
1.0000 | ORAL_TABLET | Freq: Every day | ORAL | Status: DC
  Administered 2012-02-13 – 2012-02-14 (×2): 1 via ORAL
  Filled 2012-02-13 (×4): qty 1

## 2012-02-13 MED ORDER — VANCOMYCIN HCL 1000 MG IV SOLR
750.0000 mg | Freq: Once | INTRAVENOUS | Status: AC
  Administered 2012-02-13: 750 mg via INTRAVENOUS
  Filled 2012-02-13: qty 750

## 2012-02-13 MED ORDER — CALCIUM ACETATE 667 MG PO CAPS
2001.0000 mg | ORAL_CAPSULE | Freq: Three times a day (TID) | ORAL | Status: DC
  Administered 2012-02-13 – 2012-02-15 (×7): 2001 mg via ORAL
  Filled 2012-02-13 (×10): qty 3

## 2012-02-13 MED ORDER — SIMVASTATIN 10 MG PO TABS
10.0000 mg | ORAL_TABLET | Freq: Every day | ORAL | Status: DC
  Administered 2012-02-13 – 2012-02-14 (×2): 10 mg via ORAL
  Filled 2012-02-13 (×3): qty 1

## 2012-02-13 MED ORDER — LANTHANUM CARBONATE 500 MG PO CHEW
2000.0000 mg | CHEWABLE_TABLET | Freq: Two times a day (BID) | ORAL | Status: DC
  Administered 2012-02-13 – 2012-02-15 (×4): 2000 mg via ORAL
  Filled 2012-02-13 (×8): qty 4

## 2012-02-13 MED ORDER — PIPERACILLIN-TAZOBACTAM IN DEX 2-0.25 GM/50ML IV SOLN
2.2500 g | Freq: Three times a day (TID) | INTRAVENOUS | Status: DC
  Administered 2012-02-13 – 2012-02-14 (×4): 2.25 g via INTRAVENOUS
  Filled 2012-02-13 (×6): qty 50

## 2012-02-13 MED ORDER — CITALOPRAM HYDROBROMIDE 20 MG PO TABS
20.0000 mg | ORAL_TABLET | Freq: Every day | ORAL | Status: DC
  Administered 2012-02-13 – 2012-02-15 (×3): 20 mg via ORAL
  Filled 2012-02-13 (×3): qty 1

## 2012-02-13 MED ORDER — ONDANSETRON HCL 4 MG/2ML IJ SOLN: 4.0000 mg | Freq: Four times a day (QID) | INTRAMUSCULAR | Status: DC | PRN

## 2012-02-13 MED ORDER — ALUM & MAG HYDROXIDE-SIMETH 200-200-20 MG/5ML PO SUSP
30.0000 mL | Freq: Four times a day (QID) | ORAL | Status: DC | PRN
  Filled 2012-02-13: qty 30

## 2012-02-13 MED ORDER — ONDANSETRON HCL 4 MG PO TABS: 4.0000 mg | ORAL_TABLET | Freq: Four times a day (QID) | ORAL | Status: DC | PRN

## 2012-02-13 MED ORDER — VANCOMYCIN HCL IN DEXTROSE 1-5 GM/200ML-% IV SOLN
1000.0000 mg | INTRAVENOUS | Status: DC
  Filled 2012-02-13: qty 200

## 2012-02-13 MED ORDER — HYDROCODONE-ACETAMINOPHEN 5-325 MG PO TABS
1.0000 | ORAL_TABLET | ORAL | Status: DC | PRN
  Administered 2012-02-13: 1 via ORAL
  Administered 2012-02-14 – 2012-02-15 (×5): 2 via ORAL
  Filled 2012-02-13: qty 1
  Filled 2012-02-13 (×5): qty 2

## 2012-02-13 MED ORDER — PANTOPRAZOLE SODIUM 40 MG PO TBEC
40.0000 mg | DELAYED_RELEASE_TABLET | Freq: Every day | ORAL | Status: DC
  Administered 2012-02-13: 40 mg via ORAL
  Filled 2012-02-13: qty 1

## 2012-02-13 MED ORDER — METOPROLOL TARTRATE 1 MG/ML IV SOLN
2.5000 mg | Freq: Once | INTRAVENOUS | Status: AC
Start: 2012-02-13 — End: 2012-02-13
  Administered 2012-02-13: 2.5 mg via INTRAVENOUS
  Filled 2012-02-13: qty 5

## 2012-02-13 MED ORDER — DOCUSATE SODIUM 100 MG PO CAPS
400.0000 mg | ORAL_CAPSULE | Freq: Two times a day (BID) | ORAL | Status: DC
  Administered 2012-02-13 (×2): 400 mg via ORAL
  Filled 2012-02-13 (×6): qty 4

## 2012-02-13 MED ORDER — SODIUM CHLORIDE 0.9 % IJ SOLN
3.0000 mL | Freq: Two times a day (BID) | INTRAMUSCULAR | Status: DC
  Administered 2012-02-13 – 2012-02-15 (×4): 3 mL via INTRAVENOUS

## 2012-02-13 MED ORDER — GUAIFENESIN-DM 100-10 MG/5ML PO SYRP: 5.0000 mL | ORAL_SOLUTION | ORAL | Status: DC | PRN

## 2012-02-13 MED ORDER — DOXYCYCLINE HYCLATE 100 MG PO TABS
100.0000 mg | ORAL_TABLET | Freq: Two times a day (BID) | ORAL | Status: DC
  Administered 2012-02-13 – 2012-02-15 (×5): 100 mg via ORAL
  Filled 2012-02-13 (×7): qty 1

## 2012-02-13 NOTE — Progress Notes (Addendum)
[facility]  Interim history: 56 y/o who started developed chills and felt feverish 1 wk ago on Saturday. He on and off loose stools but mostly recovered from the chills and feverish feeling in 24 hrs, had his regular dialysis on Monday. After dialysis on Wed he developed fevers - temp 101. He had a mild cough and noted pain in his head every time he coughed. He complained of stiffness in his neck but on exam, this is left sided and muscular. He saw his doctor and received a prescription for Levaquin which he took on Friday and Saturday.  He felt dizzy after dialysis on Friday and passed out in the center just after getting up out of his chair. He feels he blacked out and fell became conscious immediately and others may have thought he simply fell.  He continued to feel dizzy every time he has stood up. He feels he is dehydrated from the diarrhea which has worsened since he started taking the Levaquin. He states stool is black liquid.  He has not had any abdominal pain or vomiting but has barely eaten this week. He has been drinking lots of liquids.  He does not make urine.    Antibiotics: Vanc, Cefepime, Zosyn and Doxycycline  Subjective: Feels better when compared to yesterday. Last BM was last night in the ER- also black and watery. No sore throat, headache, vomiting, abd pain, photophobia, rash or joints pains.  Mild neck stiffness on the left neck as mentioned above- likely muscular.   Objective: Blood pressure 123/38, pulse 93, temperature 98.3 F (36.8 C), temperature source Axillary, resp. rate 26, height 5\' 10"  (1.778 m), weight 80.7 kg (177 lb 14.6 oz), SpO2 97.00%. Weight change:   Intake/Output Summary (Last 24 hours) at 02/13/12 0951 Last data filed at 02/13/12 0855  Gross per 24 hour  Intake    680 ml  Output      0 ml  Net    680 ml    Physical Exam: Completed No significant findings.   Lab Results:  [facility] 02/13/12 0730 02/12/12 1642 02/12/12 1613  NA 136  137 --  K 4.0 3.9 --  CL 93* 98 --  CO2 21 -- 23  GLUCOSE 97 107* --  BUN 69* 57* --  CREATININE 9.79* 8.40* --  CALCIUM 7.8* -- 8.4  MG 1.8 -- --  PHOS 5.4* -- --    Basename 02/13/12 0730 02/12/12 1613  AST 32 50*  ALT 45 55*  ALKPHOS 56 64  BILITOT 0.3 0.3  PROT 6.8 7.6  ALBUMIN 2.6* 2.9*   No results found for this basename: LIPASE:2,AMYLASE:2 in the last 72 hours  Basename 02/13/12 0730 02/12/12 1642 02/12/12 1613  WBC 10.2 -- 11.7*  NEUTROABS -- -- 9.7*  HGB 10.8* 13.6 --  HCT 31.5* 40.0 --  MCV 97.8 -- 98.1  PLT 114* -- 127*    Basename 02/13/12 0730  CKTOTAL 53  CKMB 1.6  CKMBINDEX --  TROPONINI <0.30   No components found with this basename: POCBNP:3 No results found for this basename: DDIMER:2 in the last 72 hours No results found for this basename: HGBA1C:2 in the last 72 hours No results found for this basename: CHOL:2,HDL:2,LDLCALC:2,TRIG:2,CHOLHDL:2,LDLDIRECT:2 in the last 72 hours No results found for this basename: TSH,T4TOTAL,FREET3,T3FREE,THYROIDAB in the last 72 hours No results found for this basename: VITAMINB12:2,FOLATE:2,FERRITIN:2,TIBC:2,IRON:2,RETICCTPCT:2 in the last 72 hours  Micro Results: Recent Results (from the past 240 hour(s))  MRSA PCR SCREENING     Status:  Normal   Collection Time   02/13/12  3:27 AM      Component Value Range Status Comment   MRSA by PCR NEGATIVE  NEGATIVE  Final     Studies/Results: Ct Abdomen Pelvis Wo Contrast  02/12/2012  *RADIOLOGY REPORT*  Clinical Data: Abdominal distension, weakness, history of renal transplant  CT ABDOMEN AND PELVIS WITHOUT CONTRAST  Technique:  Multidetector CT imaging of the abdomen and pelvis was performed following the standard protocol without intravenous contrast.  Comparison: None.  Findings: Linear scarring versus atelectasis at the left lung base.  Unenhanced liver, spleen, pancreas, and adrenal glands within normal limits.  Gallbladder is underdistended.  No intrahepatic or  extrahepatic ductal dilatation.  Bilateral native renal atrophy.  Multiple bilateral probable renal cysts, most of which are too small to characterize.  No hydronephrosis.  Right lower quadrant renal transplant.  Mild surrounding nonspecific perinephric stranding.  No hydronephrosis.  No evidence of bowel obstruction.  Colonic diverticulosis, without associated inflammatory changes.  Atherosclerotic calcifications of the abdominal aorta and branch vessels.  No abdominopelvic ascites.  No suspicious abdominopelvic lymphadenopathy.  Prostate is unremarkable.  No ureteral or bladder calculi.  Bladder is underdistended.  Laxity of the lower abdominal wall.  Suspected incisional hernia defect along the right lateral abdominal wall containing a loop of nondilated colon (series 2/image 44).  Degenerative changes of the visualized thoracolumbar spine.  IMPRESSION: No evidence of bowel obstruction.  Right lower quadrant renal transplant.  No hydronephrosis.  No CT findings to account for the patient's abdominal symptoms.  Original Report Authenticated By: Charline Bills, M.D.   Dg Chest 2 View  02/12/2012  *RADIOLOGY REPORT*  Clinical Data: Fever, body aches  CHEST - 2 VIEW  Comparison: 12/06/2011  Findings: Lungs are essentially clear. No pleural effusion or pneumothorax.  The heart is top normal in size.  Degenerative changes of the visualized thoracolumbar spine.  IMPRESSION: No evidence of acute cardiopulmonary disease.  Original Report Authenticated By: Charline Bills, M.D.    Medications: Scheduled Meds:   . aspirin EC  81 mg Oral Daily  . calcium acetate  2,001 mg Oral TID WC  . ceFEPime (MAXIPIME) IV  1 g Intravenous Q12H  . citalopram  20 mg Oral Daily  . diphenhydrAMINE  25 mg Oral Once  . docusate sodium  400 mg Oral BID  . doxycycline  100 mg Oral Q12H  . folic acid  1 mg Oral Daily  . lanthanum  2,000 mg Oral BID WC  . multivitamin  1 tablet Oral Daily  . pantoprazole  40 mg Oral Q1200  .  piperacillin-tazobactam (ZOSYN)  IV  2.25 g Intravenous Q8H  . simvastatin  10 mg Oral q1800  . sodium chloride  1,000 mL Intravenous Once  . sodium chloride  1,000 mL Intravenous Once  . sodium chloride  3 mL Intravenous Q12H  . vancomycin  750 mg Intravenous Once  . vancomycin  1,000 mg Intravenous Once  . vancomycin  1,000 mg Intravenous Q M,W,F-HD  . DISCONTD: docusate sodium  100 mg Oral BID   Continuous Infusions:   . sodium chloride 50 mL/hr at 02/13/12 0602   PRN Meds:.acetaminophen, acetaminophen, albuterol, alum & mag hydroxide-simeth, guaiFENesin-dextromethorphan, HYDROcodone-acetaminophen, ondansetron (ZOFRAN) IV, ondansetron  Assessment/Plan: Principal Problem:  *Hypotension/ momentary syncope May be related to dehydration. Lactic acid was elevated.  He states he did have 30 min left on dialysis on Friday but his dry weight was where is was supposed to be. With slow hydration (  IVF and IV antibiotics) his BP has improved and I suspect this is why he is feeling better.  Will stop fluid for now as he is feeling better. (see Aortic valve disease below)   SIRS No obvious source found as of yet.  CT abd/ pelvis without  PO contrast was negative CXR clear He does not make urine.  His fistula is warm but non-tender or inflamed. He states it is always very warm.  Pro-calcitonin may be elevated from renal failure. Will need to repeat tomorrow.  Diarrhea has stopped without flagyl which points against c. Diff. Stool studies pending.   Active Problems:  ESRD (end stage renal disease) s/p failed renal transplant Have contacted Dr Arlean Hopping Dialysis days are M/W/F Check weights.  Stop IVF for now - may need to resume if diarrhea returns or PO intake is poor.  Change to renal diet.    Diarrhea As above under SIRS  Thrombocytopenia No old labs to compare with. If it is acute, it points toward and acute illness- either viral or bacterial.  Will follow.  Hopefully Nephrology  may have old labs that we can obtain on Monday.    Aortic valve disease Had an episode of flash pulmonary edema in April- was told his valve may need repair. He was at [facility]. I will be requesting records of this today. I hear no murmur on exam.    Code Status: full Family Communication: with wife at bedside Disposition: follow in SDU   [facility] [phone] 02/13/2012, 9:51 AM  LOS: 1 day

## 2012-02-13 NOTE — ED Provider Notes (Signed)
I saw and evaluated the patient, reviewed the resident's note and I agree with the findings and plan.  ESRD s/p failed transplant with 1 week of fatigue, fever, cough, L neck pain. Hypotensive to 80s but mentating well. Neck pain appears to be MSK and not meningismus. Responded to IVF.  Does not make urine. Cultures, abx, admit for SIRS/sepsis.  CRITICAL CARE Performed by: Glynn Octave   Total critical care time: 40  Critical care time was exclusive of separately billable procedures and treating other patients.  Critical care was necessary to treat or prevent imminent or life-threatening deterioration.  Critical care was time spent personally by me on the following activities: development of treatment plan with patient and/or surrogate as well as nursing, discussions with consultants, evaluation of patient's response to treatment, examination of patient, obtaining history from patient or surrogate, ordering and performing treatments and interventions, ordering and review of laboratory studies, ordering and review of radiographic studies, pulse oximetry and re-evaluation of patient's condition.   Glynn Octave, MD 02/13/12 1218

## 2012-02-13 NOTE — Progress Notes (Signed)
ANTIBIOTIC CONSULT NOTE - INITIAL  Pharmacy Consult for Vancocin/Zosyn Indication: rule out sepsis  Allergies  Allergen Reactions  . Penicillins Other (See Comments)    migraine  . Adhesive (Tape) Rash    Please use paper tape    Patient Measurements: Height: 5\' 10"  (177.8 cm) Weight: 177 lb 14.6 oz (80.7 kg) IBW/kg (Calculated) : 73   Vital Signs: Temp: 99.7 F (37.6 C) (06/09 0327) Temp src: Oral (06/09 0327) BP: 96/37 mmHg (06/09 0327) Pulse Rate: 99  (06/09 0327)  Labs:  Basename 02/12/12 1642 02/12/12 1613  WBC -- 11.7*  HGB 13.6 12.5*  PLT -- 127*  LABCREA -- --  CREATININE 8.40* 8.53*   Estimated Creatinine Clearance: 10.1 ml/min (by C-G formula based on Cr of 8.4).  Microbiology: No results found for this or any previous visit (from the past 720 hour(s)).  Medical History: Past Medical History  Diagnosis Date  . Renal failure   . Multiple sclerosis   . Atrial fibrillation   . CAD (coronary artery disease)   . Aortic heart valve prolapse     Medications:  Prescriptions prior to admission  Medication Sig Dispense Refill  . aspirin EC 81 MG tablet Take 81 mg by mouth daily.      Marland Kitchen b complex-vitamin c-folic acid (NEPHRO-VITE) 0.8 MG TABS Take 0.8 mg by mouth daily.       . calcium acetate (PHOSLO) 667 MG capsule Take 2,001 mg by mouth 3 (three) times daily with meals.      . citalopram (CELEXA) 20 MG tablet Take 20 mg by mouth daily.      Marland Kitchen docusate sodium (COLACE) 100 MG capsule Take 400 mg by mouth 2 (two) times daily.      . folic acid (FOLVITE) 1 MG tablet Take 1 mg by mouth daily.      Marland Kitchen gabapentin (NEURONTIN) 300 MG capsule Take 300 mg by mouth every hemodialysis. Take on Monday, Wednesday, and Friday      . lanthanum (FOSRENOL) 1000 MG chewable tablet Chew 2,000 mg by mouth 2 (two) times daily with a meal.      . levofloxacin (LEVAQUIN) 500 MG tablet Take 500 mg by mouth daily. For 10 days. Started on Friday 6/7      . metoprolol (LOPRESSOR) 50  MG tablet Take 50 mg by mouth 2 (two) times daily.      Marland Kitchen omeprazole (PRILOSEC OTC) 20 MG tablet Take 20 mg by mouth daily.      . simvastatin (ZOCOR) 20 MG tablet Take 10 mg by mouth daily.       Scheduled:    . aspirin EC  81 mg Oral Daily  . calcium acetate  2,001 mg Oral TID WC  . ceFEPime (MAXIPIME) IV  1 g Intravenous Q12H  . citalopram  20 mg Oral Daily  . diphenhydrAMINE  25 mg Oral Once  . docusate sodium  100 mg Oral BID  . docusate sodium  400 mg Oral BID  . doxycycline  100 mg Oral Q12H  . folic acid  1 mg Oral Daily  . lanthanum  2,000 mg Oral BID WC  . multivitamin  1 tablet Oral Daily  . omeprazole  20 mg Oral Daily  . piperacillin-tazobactam (ZOSYN)  IV  2.25 g Intravenous Q8H  . simvastatin  10 mg Oral Daily  . sodium chloride  1,000 mL Intravenous Once  . sodium chloride  1,000 mL Intravenous Once  . sodium chloride  3 mL Intravenous  Q12H  . vancomycin  750 mg Intravenous Once  . vancomycin  1,000 mg Intravenous Once  . vancomycin  1,000 mg Intravenous Q M,W,F-HD   Assessment: 56yo male with ESRD was tx'd as outpt for low-grade fever, now feeling better but having diarrhea and hypotension, to begin IV ABX for possible sepsis.  Goal of Therapy:  Pre-HD vanc level 15-25  Plan:  Rec'd vanc 1g last pm in ED; will give additional vancomcyin 750mg  IV x1 to complete load then begin 1g after reach HD; will also start Zosyn 2.25g IV Q8H; monitor CBC, Cx, levels prn.  Colleen Can PharmD BCPS 02/13/2012,4:02 AM

## 2012-02-14 ENCOUNTER — Inpatient Hospital Stay (HOSPITAL_COMMUNITY): Payer: Medicare Other

## 2012-02-14 DIAGNOSIS — R197 Diarrhea, unspecified: Secondary | ICD-10-CM

## 2012-02-14 DIAGNOSIS — G35 Multiple sclerosis: Secondary | ICD-10-CM | POA: Diagnosis present

## 2012-02-14 DIAGNOSIS — R55 Syncope and collapse: Secondary | ICD-10-CM

## 2012-02-14 DIAGNOSIS — N186 End stage renal disease: Secondary | ICD-10-CM

## 2012-02-14 DIAGNOSIS — T8612 Kidney transplant failure: Secondary | ICD-10-CM | POA: Diagnosis present

## 2012-02-14 DIAGNOSIS — I959 Hypotension, unspecified: Secondary | ICD-10-CM

## 2012-02-14 LAB — COMPREHENSIVE METABOLIC PANEL
Albumin: 2.2 g/dL — ABNORMAL LOW (ref 3.5–5.2)
BUN: 81 mg/dL — ABNORMAL HIGH (ref 6–23)
CO2: 21 mEq/L (ref 19–32)
Chloride: 93 mEq/L — ABNORMAL LOW (ref 96–112)
Creatinine, Ser: 11.03 mg/dL — ABNORMAL HIGH (ref 0.50–1.35)
GFR calc Af Amer: 5 mL/min — ABNORMAL LOW (ref >90)
GFR calc non Af Amer: 5 mL/min — ABNORMAL LOW (ref >90)
Glucose, Bld: 156 mg/dL — ABNORMAL HIGH (ref 70–99)
Total Bilirubin: 0.2 mg/dL — ABNORMAL LOW (ref 0.3–1.2)

## 2012-02-14 LAB — PROCALCITONIN: Procalcitonin: 7.14 ng/mL

## 2012-02-14 LAB — CBC
HCT: 30.4 % — ABNORMAL LOW (ref 39.0–52.0)
MCV: 97.1 fL (ref 78.0–100.0)
RDW: 14.9 % (ref 11.5–15.5)
WBC: 11.9 10*3/uL — ABNORMAL HIGH (ref 4.0–10.5)

## 2012-02-14 LAB — EHRLICHIA ANTIBODY PANEL
E chaffeensis (HGE) Ab, IgG: NEGATIVE
E chaffeensis (HGE) Ab, IgM: NEGATIVE

## 2012-02-14 LAB — CLOSTRIDIUM DIFFICILE BY PCR: Toxigenic C. Difficile by PCR: POSITIVE — AB

## 2012-02-14 MED ORDER — HEPARIN SODIUM (PORCINE) 1000 UNIT/ML DIALYSIS
20.0000 [IU]/kg | INTRAMUSCULAR | Status: DC | PRN
  Filled 2012-02-14: qty 2

## 2012-02-14 MED ORDER — HEPARIN SODIUM (PORCINE) 1000 UNIT/ML DIALYSIS
1000.0000 [IU] | INTRAMUSCULAR | Status: DC | PRN
  Filled 2012-02-14: qty 1

## 2012-02-14 MED ORDER — PENTAFLUOROPROP-TETRAFLUOROETH EX AERO: 1.0000 "application " | INHALATION_SPRAY | CUTANEOUS | Status: DC | PRN

## 2012-02-14 MED ORDER — ALTEPLASE 2 MG IJ SOLR
2.0000 mg | Freq: Once | INTRAMUSCULAR | Status: AC | PRN
  Filled 2012-02-14: qty 2

## 2012-02-14 MED ORDER — NEPRO/CARBSTEADY PO LIQD: 237.0000 mL | ORAL | Status: DC | PRN

## 2012-02-14 MED ORDER — LIDOCAINE HCL (PF) 1 % IJ SOLN: 5.0000 mL | INTRAMUSCULAR | Status: DC | PRN

## 2012-02-14 MED ORDER — LIDOCAINE-PRILOCAINE 2.5-2.5 % EX CREA
1.0000 "application " | TOPICAL_CREAM | CUTANEOUS | Status: DC | PRN
  Filled 2012-02-14: qty 5

## 2012-02-14 MED ORDER — DEXTROSE 5 % IV SOLN
2.0000 g | INTRAVENOUS | Status: DC
  Filled 2012-02-14: qty 2

## 2012-02-14 MED ORDER — SODIUM CHLORIDE 0.9 % IV SOLN: 100.0000 mL | INTRAVENOUS | Status: DC | PRN

## 2012-02-14 MED ORDER — METRONIDAZOLE 500 MG PO TABS
500.0000 mg | ORAL_TABLET | Freq: Three times a day (TID) | ORAL | Status: DC
  Administered 2012-02-14 – 2012-02-15 (×3): 500 mg via ORAL
  Filled 2012-02-14 (×6): qty 1

## 2012-02-14 NOTE — Progress Notes (Signed)
Positive C-diff result communicated to MD via text page.

## 2012-02-14 NOTE — Progress Notes (Signed)
At 2113, pt's heartrate accelerated up to 154 and at 2114 pt heart rate had changed from SR to AFIB.  Pt denies any activity or known precipitating event. Afib confirmed by 12 lead EKG which shows AFIBwRVR.  Rate ranging from 96 - 133 on monitor. Also denies SOB and CP.  He does say that he can feel heart "palpitations" and mild anxiety.  Elray Mcgregor NP was notified of above.  Metoprolol 2.5mg  iv was given slow iv push over 5 minutes at 2242 with good results.  BP initially dropped to 96/44 at 2300 with HR of 85, but by 2315 BP was 104/30 with HR of 84.  Pt states that he feels much better, less anxious and that the palpitations had subsided. Pt says that he has had similar events of "palpitations" when he has missed a dose of Lopressor in the past. Will continue to monitor.

## 2012-02-14 NOTE — Progress Notes (Addendum)
[facility] [place] TEAM 1 - Stepdown/ICU TEAM  PCP:  No primary provider on file.  Subjective: 56 y.o. Male w/ history of Renal failure; Multiple sclerosis; Atrial fibrillation; CAD (coronary artery disease); and Aortic heart valve prolapse who presented with a low grade fever of 72hr duration, headache, stiff neck on left side only, diffuse muscle aches, fatigue, and no appetite. He was seen by his PCP, started on Levaquin, and instructed to go to [facility]. Cultures were obtained at [facility]. Later he started to have diarrhea and was noted to have low sbp down to 90's. His regular SBP is about 110's. On the day of his admit he felt very light headed when he tried to stand up and syncopized, hitting his head and chest.  At today's visit he feels much better.  He denies dizzy spells, chest pain, photophobia, neck stiffness, or sob.   He reports continued watery stools, but states that the frequency has decreased.  He is not aware of recent tick exposure, or insect bites.    Objective:  Intake/Output Summary (Last 24 hours) at 02/14/12 1248 Last data filed at 02/14/12 0900  Gross per 24 hour  Intake   1053 ml  Output    375 ml  Net    678 ml   Blood pressure 99/31, pulse 79, temperature 98.4 F (36.9 C), temperature source Oral, resp. rate 19, height 5\' 10"  (1.778 m), weight 82 kg (180 lb 12.4 oz), SpO2 95.00%.  Physical Exam: General: No acute respiratory distress Lungs: Clear to auscultation bilaterally without wheezes or crackles Cardiovascular: Regular rate and rhythm without murmur gallop or rub normal S1 and S2 Abdomen: Nontender, nondistended, soft, bowel sounds positive, no rebound, no ascites, no appreciable mass Extremities: No significant cyanosis, clubbing, or edema bilateral lower extremities  Lab Results:  Basename 02/14/12 0415 02/13/12 0730 02/12/12 1642 02/12/12 1613  NA 134* 136 137 --  K 4.1 4.0 3.9 --  CL 93* 93* 98 --  CO2 21 21 -- 23  GLUCOSE 156* 97  107* --  BUN 81* 69* 57* --  CREATININE 11.03* 9.79* 8.40* --  CALCIUM 7.5* 7.8* -- 8.4  MG -- 1.8 -- --  PHOS -- 5.4* -- --    Basename 02/14/12 0415 02/13/12 0730  AST 24 32  ALT 37 45  ALKPHOS 59 56  BILITOT 0.2* 0.3  PROT 6.1 6.8  ALBUMIN 2.2* 2.6*    Basename 02/14/12 0415 02/13/12 0730 02/12/12 1642 02/12/12 1613  WBC 11.9* 10.2 -- 11.7*  NEUTROABS -- -- -- 9.7*  HGB 10.4* 10.8* 13.6 --  HCT 30.4* 31.5* 40.0 --  MCV 97.1 97.8 -- 98.1  PLT 118* 114* -- 127*    Basename 02/13/12 1705 02/13/12 0953 02/13/12 0730  CKTOTAL 46 46 53  CKMB 1.9 1.5 1.6  CKMBINDEX -- -- --  TROPONINI <0.30 <0.30 <0.30   Micro Results: Recent Results (from the past 240 hour(s))  CULTURE, BLOOD (ROUTINE X 2)     Status: Normal (Preliminary result)   Collection Time   02/12/12  4:20 PM      Component Value Range Status Comment   Specimen Description BLOOD RIGHT ARM   Final    Special Requests     Final    Value: BOTTLES DRAWN AEROBIC AND ANAEROBIC 10CC BLUE 8CC RED   Culture  Setup Time [id number]   Final    Culture     Final    Value:  BLOOD CULTURE RECEIVED NO GROWTH TO DATE CULTURE WILL BE HELD FOR 5 DAYS BEFORE ISSUING A FINAL NEGATIVE REPORT   Report Status PENDING   Incomplete   CULTURE, BLOOD (ROUTINE X 2)     Status: Normal (Preliminary result)   Collection Time   02/12/12  4:45 PM      Component Value Range Status Comment   Specimen Description BLOOD RIGHT ARM   Final    Special Requests BOTTLES DRAWN AEROBIC AND ANAEROBIC 10CC   Final    Culture  Setup Time [id number]   Final    Culture     Final    Value:        BLOOD CULTURE RECEIVED NO GROWTH TO DATE CULTURE WILL BE HELD FOR 5 DAYS BEFORE ISSUING A FINAL NEGATIVE REPORT   Report Status PENDING   Incomplete   MRSA PCR SCREENING     Status: Normal   Collection Time   02/13/12  3:27 AM      Component Value Range Status Comment   MRSA by PCR NEGATIVE  NEGATIVE  Final   CLOSTRIDIUM DIFFICILE BY PCR     Status:  Abnormal   Collection Time   02/14/12 12:47 AM      Component Value Range Status Comment   C difficile by pcr POSITIVE (*) NEGATIVE  Final   STOOL CULTURE     Status: Normal (Preliminary result)   Collection Time   02/14/12 12:47 AM      Component Value Range Status Comment   Specimen Description STOOL   Final    Special Requests NONE   Final    Culture Culture reincubated for better growth   Final    Report Status PENDING   Incomplete     Studies/Results: All recent x-ray/radiology reports have been reviewed in detail.   Medications: I have reviewed the patient's complete medication list.  Assessment/Plan:  Hypotension/ momentary syncope  Due to dehydration w/ associated lactic acidosis -  with slow hydration (IVF and IV antibiotics) his BP has improved - may need to adjust dry weight until GI losses have ceased  C diff colitis with SIRS   this is likely the primary event leading to his presenting symptoms - pt is much improved with just hydration - will treat with oral flagyl alone and follow for response  ?RMSF Pt is apparently being tx empirically for the possibility of RMSF - I find little convincing evidence of such clinically - will cont doxy for full course, but stop other broad abx in interest of tx C diff - RMSF IgG titer will not likely be helpful as true diagnosis is only made with a convalescent titer, not during acute infection  ESRD (end stage renal disease) s/p failed renal transplant  Nephrology is following - on a M/W/F schedule  Thrombocytopenia  Improving - no evidence of spontaneous bleeding - cont to follow trend   Aortic valve disease  Had an episode of flash pulmonary edema in April - was told his valve may need repair [facility]) - we have requested records - hemodynamics improving with simple hydration at this time   Valvular atrial fibrillation Rate controlled - awaiting records from [facility] - does not appear to be on long term anticoag as  outpt  MS No evidence of acute flair - does not appear to be on chronic immune modifying meds  Dispo Stable for transfer to 6700 bed, but need to see diarrhea resolving/greatly slowing down and need to  assure BP is stable after HD treatments  Lonia Blood, MD [facility] Office  [phone] Pager [phone]  On-Call/Text Page:      [email or web address]      password [facility]

## 2012-02-14 NOTE — Consult Note (Signed)
Referring Provider: No ref. provider found Primary Care Physician:  No primary provider on file. Primary Nephrologist:  Dr. Hyman Hopes  Reason for Consultation:  Medical management ESRD  HPI: 56 y/o who started developed chills and felt feverish 1 wk ago on Saturday, in association with a diarrhea illness. MWF dialysis in [place].Treated with Vancomycin and Zosyn.   Past Medical History  Diagnosis Date  . Renal failure   . Multiple sclerosis   . CAD (coronary artery disease)   . Aortic heart valve prolapse   . Peripheral vascular disease   . Sleep apnea     ordered bipap with 3L bled in, but pt will not wear  . Atrial fibrillation     Past Surgical History  Procedure Date  . Av fistula placement   . Flash     flash pulmonary edema  . Kidney transplant 08/2007  . Hernia repair 07/2011  . Excised squamous cells at rectum 2006    Prior to Admission medications   Medication Sig Start Date End Date Taking? Authorizing Provider  aspirin EC 81 MG tablet Take 81 mg by mouth daily.   Yes Historical Provider, MD  b complex-vitamin c-folic acid (NEPHRO-VITE) 0.8 MG TABS Take 0.8 mg by mouth daily.    Yes Historical Provider, MD  calcium acetate (PHOSLO) 667 MG capsule Take 2,001 mg by mouth 3 (three) times daily with meals.   Yes Historical Provider, MD  citalopram (CELEXA) 20 MG tablet Take 20 mg by mouth daily.   Yes Historical Provider, MD  docusate sodium (COLACE) 100 MG capsule Take 400 mg by mouth 2 (two) times daily.   Yes Historical Provider, MD  folic acid (FOLVITE) 1 MG tablet Take 1 mg by mouth daily.   Yes Historical Provider, MD  gabapentin (NEURONTIN) 300 MG capsule Take 300 mg by mouth every hemodialysis. Take on Monday, Wednesday, and Friday   Yes Historical Provider, MD  lanthanum (FOSRENOL) 1000 MG chewable tablet Chew 2,000 mg by mouth 2 (two) times daily with a meal.   Yes Historical Provider, MD  levofloxacin (LEVAQUIN) 500 MG tablet Take 500 mg by mouth daily. For 10  days. Started on Friday 6/7 02/11/12 02/21/12 Yes Historical Provider, MD  metoprolol (LOPRESSOR) 50 MG tablet Take 50 mg by mouth 2 (two) times daily.   Yes Historical Provider, MD  omeprazole (PRILOSEC OTC) 20 MG tablet Take 20 mg by mouth daily.   Yes Historical Provider, MD  simvastatin (ZOCOR) 20 MG tablet Take 10 mg by mouth daily.   Yes Historical Provider, MD    Current Facility-Administered Medications  Medication Dose Route Frequency Provider Last Rate Last Dose  . acetaminophen (TYLENOL) tablet 650 mg  650 mg Oral Q6H PRN Therisa Doyne, MD   650 mg at 02/13/12 0911   Or  . acetaminophen (TYLENOL) suppository 650 mg  650 mg Rectal Q6H PRN Therisa Doyne, MD      . albuterol (PROVENTIL) (5 MG/ML) 0.5% nebulizer solution 2.5 mg  2.5 mg Nebulization Q2H PRN Therisa Doyne, MD      . alum & mag hydroxide-simeth (MAALOX/MYLANTA) 200-200-20 MG/5ML suspension 30 mL  30 mL Oral Q6H PRN Therisa Doyne, MD      . aspirin EC tablet 81 mg  81 mg Oral Daily Therisa Doyne, MD   81 mg at 02/13/12 1050  . calcium acetate (PHOSLO) capsule 2,001 mg  2,001 mg Oral TID WC Therisa Doyne, MD   2,001 mg at 02/14/12 0841  . ceFEPIme (MAXIPIME) 1  g in dextrose 5 % 50 mL IVPB  1 g Intravenous Q12H Clemetine Marker, MD   1 g at 02/13/12 2124  . citalopram (CELEXA) tablet 20 mg  20 mg Oral Daily Therisa Doyne, MD   20 mg at 02/13/12 1050  . docusate sodium (COLACE) capsule 400 mg  400 mg Oral BID Therisa Doyne, MD   400 mg at 02/13/12 2242  . doxycycline (VIBRA-TABS) tablet 100 mg  100 mg Oral Q12H Therisa Doyne, MD   100 mg at 02/13/12 2243  . folic acid (FOLVITE) tablet 1 mg  1 mg Oral Daily Therisa Doyne, MD   1 mg at 02/13/12 1050  . guaiFENesin-dextromethorphan (ROBITUSSIN DM) 100-10 MG/5ML syrup 5 mL  5 mL Oral Q4H PRN Therisa Doyne, MD      . HYDROcodone-acetaminophen (NORCO) 5-325 MG per tablet 1-2 tablet  1-2 tablet Oral Q4H PRN Therisa Doyne, MD   2  tablet at 02/14/12 0630  . lanthanum (FOSRENOL) chewable tablet 2,000 mg  2,000 mg Oral BID WC Therisa Doyne, MD   2,000 mg at 02/14/12 0841  . metoprolol (LOPRESSOR) injection 2.5 mg  2.5 mg Intravenous Once Jinger Neighbors, NP   2.5 mg at 02/13/12 2242  . multivitamin (RENA-VIT) tablet 1 tablet  1 tablet Oral Daily Therisa Doyne, MD   1 tablet at 02/13/12 2243  . ondansetron (ZOFRAN) tablet 4 mg  4 mg Oral Q6H PRN Therisa Doyne, MD       Or  . ondansetron (ZOFRAN) injection 4 mg  4 mg Intravenous Q6H PRN Therisa Doyne, MD      . pantoprazole (PROTONIX) EC tablet 40 mg  40 mg Oral Q1200 Therisa Doyne, MD   40 mg at 02/13/12 1150  . piperacillin-tazobactam (ZOSYN) IVPB 2.25 g  2.25 g Intravenous Q8H Colleen Can, PHARMD   2.25 g at 02/14/12 0407  . simvastatin (ZOCOR) tablet 10 mg  10 mg Oral q1800 Therisa Doyne, MD   10 mg at 02/13/12 1744  . sodium chloride 0.9 % injection 3 mL  3 mL Intravenous Q12H Therisa Doyne, MD   3 mL at 02/13/12 2242  . vancomycin (VANCOCIN) IVPB 1000 mg/200 mL premix  1,000 mg Intravenous Q M,W,F-HD Colleen Can, PHARMD      . DISCONTD: 0.9 %  sodium chloride infusion   Intravenous Continuous Therisa Doyne, MD 50 mL/hr at 02/13/12 0602      Allergies as of 02/12/2012 - Review Complete 02/12/2012  Allergen Reaction Noted  . Penicillins Other (See Comments) 02/12/2012  . Adhesive (tape) Rash 02/12/2012    No family history on file.  History   Social History  . Marital Status: Single    Spouse Name: N/A    Number of Children: N/A  . Years of Education: N/A   Occupational History  . Not on file.   Social History Main Topics  . Smoking status: Former Smoker -- 2.0 packs/day for 20 years    Types: Cigarettes    Quit date: 08/06/1998  . Smokeless tobacco: Not on file  . Alcohol Use: No  . Drug Use: No  . Sexually Active:    Other Topics Concern  . Not on file   Social History Narrative  . No  narrative on file    Review of Systems: Gen: fever and chills HEENT: No visual complaints, No history of Retinopathy. Normal external appearance No Epistaxis or Sore throat. No sinusitis.   CV: Denies chest pain, angina, palpitations, syncope, orthopnea, PND,  peripheral edema, and claudication. Resp: Denies dyspnea at rest, dyspnea with exercise, cough, sputum, wheezing, coughing up blood, and pleurisy. GI: Denies vomiting blood, jaundice, and fecal incontinence.   Denies dysphagia or odynophagia. diarrhea GU : Denies urinary burning, blood in urine, urinary frequency, urinary hesitancy, nocturnal urination, and urinary incontinence.  No renal calculi. MS: Denies joint pain, limitation of movement, and swelling, stiffness, low back pain, extremity pain. Denies muscle weakness, cramps, atrophy.  No use of non steroidal antiinflammatory drugs. Derm: Denies rash, itching, dry skin, hives, moles, warts, or unhealing ulcers.  Psych: Denies depression, anxiety, memory loss, suicidal ideation, hallucinations, paranoia, and confusion. Heme: Denies bruising, bleeding, and enlarged lymph nodes. Neuro: No headache.  No diplopia. No dysarthria.  No dysphasia.  No history of CVA.  No Seizures. No paresthesias.  No weakness. Endocrine No DM.  No Thyroid disease.  No Adrenal disease.  Physical Exam: Vital signs in last 24 hours: Temp:  [97.8 F (36.6 C)-99.7 F (37.6 C)] 97.8 F (36.6 C) (06/10 0455) Pulse Rate:  [76-115] 94  (06/10 0800) Resp:  [15-27] 20  (06/10 0800) BP: (96-148)/(30-68) 102/42 mmHg (06/10 0800) SpO2:  [92 %-100 %] 92 % (06/10 0800) Weight:  [82 kg (180 lb 12.4 oz)] 82 kg (180 lb 12.4 oz) (06/10 0455) Last BM Date: 02/13/12 General:   Chronically ill Head:  Normocephalic and atraumatic. Eyes:  Sclera clear, no icterus.   Conjunctiva pink. Ears:  Normal auditory acuity. Nose:  No deformity, discharge,  or lesions. Mouth:  No deformity or lesions, dentition normal. Neck:   Supple; no masses or thyromegaly. JVP not elevated Lungs:  Clear throughout to auscultation.   No wheezes, crackles, or rhonchi. No acute distress. Heart:  Regular rate and rhythm; no murmurs, clicks, rubs,  or gallops. Abdomen:  Soft, nontender and nondistended. No masses, hepatosplenomegaly or hernias noted. Normal bowel sounds, without guarding, and without rebound.   Msk:  Symmetrical without gross deformities. Normal posture. Pulses:  No carotid, renal, femoral bruits. DP and PT symmetrical and equal Extremities:  AVF non tender no edema Neurologic:  Alert and  oriented x4;  grossly normal neurologically. Skin:  Intact without significant lesions or rashes. Cervical Nodes:  No significant cervical adenopathy. Psych:  Alert and cooperative. Normal mood and affect.  Intake/Output from previous day: 06/09 0701 - 06/10 0700 In: 1443 [P.O.:1140; I.V.:53; IV Piggyback:250] Out: 375 [Stool:375] Intake/Output this shift: Total I/O In: 480 [P.O.:480] Out: -   Lab Results:  Basename 02/14/12 0415 02/13/12 0730 02/12/12 1642 02/12/12 1613  WBC 11.9* 10.2 -- 11.7*  HGB 10.4* 10.8* 13.6 --  HCT 30.4* 31.5* 40.0 --  PLT 118* 114* -- 127*   BMET  Basename 02/14/12 0415 02/13/12 0730 02/12/12 1642 02/12/12 1613  NA 134* 136 137 --  K 4.1 4.0 3.9 --  CL 93* 93* 98 --  CO2 21 21 -- 23  GLUCOSE 156* 97 107* --  BUN 81* 69* 57* --  CREATININE 11.03* 9.79* 8.40* --  CALCIUM 7.5* 7.8* -- 8.4  PHOS -- 5.4* -- --   LFT  Basename 02/14/12 0415  PROT 6.1  ALBUMIN 2.2*  AST 24  ALT 37  ALKPHOS 59  BILITOT 0.2*  BILIDIR --  IBILI --   PT/INR No results found for this basename: LABPROT:2,INR:2 in the last 72 hours Hepatitis Panel No results found for this basename: HEPBSAG,HCVAB,HEPAIGM,HEPBIGM in the last 72 hours  Studies/Results: Ct Abdomen Pelvis Wo Contrast  02/12/2012  *RADIOLOGY REPORT*  Clinical  Data: Abdominal distension, weakness, history of renal transplant  CT ABDOMEN  AND PELVIS WITHOUT CONTRAST  Technique:  Multidetector CT imaging of the abdomen and pelvis was performed following the standard protocol without intravenous contrast.  Comparison: None.  Findings: Linear scarring versus atelectasis at the left lung base.  Unenhanced liver, spleen, pancreas, and adrenal glands within normal limits.  Gallbladder is underdistended.  No intrahepatic or extrahepatic ductal dilatation.  Bilateral native renal atrophy.  Multiple bilateral probable renal cysts, most of which are too small to characterize.  No hydronephrosis.  Right lower quadrant renal transplant.  Mild surrounding nonspecific perinephric stranding.  No hydronephrosis.  No evidence of bowel obstruction.  Colonic diverticulosis, without associated inflammatory changes.  Atherosclerotic calcifications of the abdominal aorta and branch vessels.  No abdominopelvic ascites.  No suspicious abdominopelvic lymphadenopathy.  Prostate is unremarkable.  No ureteral or bladder calculi.  Bladder is underdistended.  Laxity of the lower abdominal wall.  Suspected incisional hernia defect along the right lateral abdominal wall containing a loop of nondilated colon (series 2/image 44).  Degenerative changes of the visualized thoracolumbar spine.  IMPRESSION: No evidence of bowel obstruction.  Right lower quadrant renal transplant.  No hydronephrosis.  No CT findings to account for the patient's abdominal symptoms.  Original Report Authenticated By: Charline Bills, M.D.   Dg Chest 2 View  02/12/2012  *RADIOLOGY REPORT*  Clinical Data: Fever, body aches  CHEST - 2 VIEW  Comparison: 12/06/2011  Findings: Lungs are essentially clear. No pleural effusion or pneumothorax.  The heart is top normal in size.  Degenerative changes of the visualized thoracolumbar spine.  IMPRESSION: No evidence of acute cardiopulmonary disease.  Original Report Authenticated By: Charline Bills, M.D.    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

## 2012-02-14 NOTE — Progress Notes (Signed)
This visit was requested by pt's church body.   Pt was in dialysis.  Pt's wife, Ander Slade, was in the room.  We spoke of pt's battle with his illness, her fatigue as caregiver, and the uncertainty of the future.  I provided emotional and spiritual support.  Pt is to moving to [address], so after the visit I showed Joy how to get to the unit, introduced her to a nurse, and showed her an empty room.  Will continue to follow.  Please page if I am needed or requested. Dellie Catholic  [phone] personal pager  02/14/12 1600  Clinical Encounter Type  Visited With Patient not available;Family  Visit Type Initial;Psychological support;Spiritual support  Referral From Patient's clergy  Spiritual Encounters  Spiritual Needs Emotional  Stress Factors  Patient Stress Factors Health changes  Family Stress Factors Major life changes;Loss of control;Lack of knowledge;Exhausted

## 2012-02-14 NOTE — Progress Notes (Signed)
Utilization Review Completed.  [name]  02/14/2012  

## 2012-02-14 NOTE — Progress Notes (Addendum)
ANTIBIOTIC CONSULT NOTE - Follow-up  Pharmacy Consult for Vancmycin/Zosyn Indication: rule out sepsis  Allergies  Allergen Reactions  . Penicillins Other (See Comments)    migraine  . Adhesive (Tape) Rash    Please use paper tape    Patient Measurements: Height: 5\' 10"  (177.8 cm) Weight: 180 lb 12.4 oz (82 kg) IBW/kg (Calculated) : 73   Vital Signs: Temp: 97.8 F (36.6 C) (06/10 0455) Temp src: Oral (06/10 0455) BP: 97/39 mmHg (06/10 0455) Pulse Rate: 97  (06/10 0455)  Labs:  Basename 02/14/12 0415 02/13/12 0730 02/12/12 1642 02/12/12 1613  WBC 11.9* 10.2 -- 11.7*  HGB 10.4* 10.8* 13.6 --  PLT 118* 114* -- 127*  LABCREA -- -- -- --  CREATININE 11.03* 9.79* 8.40* --   Estimated Creatinine Clearance: 7.7 ml/min (by C-G formula based on Cr of 11.03).  Microbiology: Recent Results (from the past 720 hour(s))  CULTURE, BLOOD (ROUTINE X 2)     Status: Normal (Preliminary result)   Collection Time   02/12/12  4:20 PM      Component Value Range Status Comment   Specimen Description BLOOD RIGHT ARM   Final    Special Requests     Final    Value: BOTTLES DRAWN AEROBIC AND ANAEROBIC 10CC BLUE 8CC RED   Culture  Setup Time [id number]   Final    Culture     Final    Value:        BLOOD CULTURE RECEIVED NO GROWTH TO DATE CULTURE WILL BE HELD FOR 5 DAYS BEFORE ISSUING A FINAL NEGATIVE REPORT   Report Status PENDING   Incomplete   CULTURE, BLOOD (ROUTINE X 2)     Status: Normal (Preliminary result)   Collection Time   02/12/12  4:45 PM      Component Value Range Status Comment   Specimen Description BLOOD RIGHT ARM   Final    Special Requests BOTTLES DRAWN AEROBIC AND ANAEROBIC 10CC   Final    Culture  Setup Time [id number]   Final    Culture     Final    Value:        BLOOD CULTURE RECEIVED NO GROWTH TO DATE CULTURE WILL BE HELD FOR 5 DAYS BEFORE ISSUING A FINAL NEGATIVE REPORT   Report Status PENDING   Incomplete   MRSA PCR SCREENING     Status: Normal   Collection Time     02/13/12  3:27 AM      Component Value Range Status Comment   MRSA by PCR NEGATIVE  NEGATIVE  Final     Medical History: Past Medical History  Diagnosis Date  . Renal failure   . Multiple sclerosis   . CAD (coronary artery disease)   . Aortic heart valve prolapse   . Peripheral vascular disease   . Sleep apnea     ordered bipap with 3L bled in, but pt will not wear  . Atrial fibrillation     Medications:  Prescriptions prior to admission  Medication Sig Dispense Refill  . aspirin EC 81 MG tablet Take 81 mg by mouth daily.      Marland Kitchen b complex-vitamin c-folic acid (NEPHRO-VITE) 0.8 MG TABS Take 0.8 mg by mouth daily.       . calcium acetate (PHOSLO) 667 MG capsule Take 2,001 mg by mouth 3 (three) times daily with meals.      . citalopram (CELEXA) 20 MG tablet Take 20 mg by mouth daily.      Marland Kitchen  docusate sodium (COLACE) 100 MG capsule Take 400 mg by mouth 2 (two) times daily.      . folic acid (FOLVITE) 1 MG tablet Take 1 mg by mouth daily.      Marland Kitchen gabapentin (NEURONTIN) 300 MG capsule Take 300 mg by mouth every hemodialysis. Take on Monday, Wednesday, and Friday      . lanthanum (FOSRENOL) 1000 MG chewable tablet Chew 2,000 mg by mouth 2 (two) times daily with a meal.      . levofloxacin (LEVAQUIN) 500 MG tablet Take 500 mg by mouth daily. For 10 days. Started on Friday 6/7      . metoprolol (LOPRESSOR) 50 MG tablet Take 50 mg by mouth 2 (two) times daily.      Marland Kitchen omeprazole (PRILOSEC OTC) 20 MG tablet Take 20 mg by mouth daily.      . simvastatin (ZOCOR) 20 MG tablet Take 10 mg by mouth daily.       Scheduled:     . aspirin EC  81 mg Oral Daily  . calcium acetate  2,001 mg Oral TID WC  . ceFEPime (MAXIPIME) IV  1 g Intravenous Q12H  . citalopram  20 mg Oral Daily  . docusate sodium  400 mg Oral BID  . doxycycline  100 mg Oral Q12H  . folic acid  1 mg Oral Daily  . lanthanum  2,000 mg Oral BID WC  . metoprolol  2.5 mg Intravenous Once  . multivitamin  1 tablet Oral Daily  .  pantoprazole  40 mg Oral Q1200  . piperacillin-tazobactam (ZOSYN)  IV  2.25 g Intravenous Q8H  . simvastatin  10 mg Oral q1800  . sodium chloride  3 mL Intravenous Q12H  . vancomycin  1,000 mg Intravenous Q M,W,F-HD   Assessment: 56yo male with ESRD was tx'd as outpt for low-grade fever (given levofloxacin), now feeling better but having diarrhea and hypotension, to begin IV ABX for possible sepsis.  Currently [name] is afebrile and WBC mildly elevated at 11.9. PCT = 7.14. He is on doxycyline for possible tickborne illness. He is HD dependent.    Vancomycin 6/8 >> Pip/tazo 6/8 >> Cefepime 6/8 >> Doxycycline 6/8 >>  Blood 6/8 >> NGTD Erlichia, RMSF >> Pending 6/9: C. Diff pending  Goal of Therapy:  Pre-HD vanc level 15-25  Plan:  1. Continue vancomycin 1gm with HD MWF 2. Patient currently on two beta-lactams (Cefepime and Zosyn), suggest D/C one of these agents  [name], [name] PharmD BCPS 02/14/2012,8:10 AM  ADDENDUM: Orders to stop zosyn have been entered, renally adjust cefepime to 2gm IV MWF after HD.

## 2012-02-15 ENCOUNTER — Encounter (HOSPITAL_COMMUNITY): Payer: Self-pay | Admitting: Family Medicine

## 2012-02-15 DIAGNOSIS — A0472 Enterocolitis due to Clostridium difficile, not specified as recurrent: Secondary | ICD-10-CM | POA: Diagnosis present

## 2012-02-15 DIAGNOSIS — R55 Syncope and collapse: Secondary | ICD-10-CM

## 2012-02-15 DIAGNOSIS — R197 Diarrhea, unspecified: Secondary | ICD-10-CM

## 2012-02-15 DIAGNOSIS — I959 Hypotension, unspecified: Secondary | ICD-10-CM

## 2012-02-15 DIAGNOSIS — N186 End stage renal disease: Secondary | ICD-10-CM

## 2012-02-15 LAB — CBC
HCT: 29.5 % — ABNORMAL LOW (ref 39.0–52.0)
MCV: 98.7 fL (ref 78.0–100.0)
RBC: 2.99 MIL/uL — ABNORMAL LOW (ref 4.22–5.81)
WBC: 11.7 10*3/uL — ABNORMAL HIGH (ref 4.0–10.5)

## 2012-02-15 LAB — FECAL LACTOFERRIN, QUANT

## 2012-02-15 MED ORDER — PROBIOTIC PO CAPS: 1.0000 | ORAL_CAPSULE | Freq: Three times a day (TID) | ORAL | Status: DC

## 2012-02-15 MED ORDER — PRO-STAT SUGAR FREE PO LIQD
30.0000 mL | Freq: Two times a day (BID) | ORAL | Status: DC
  Administered 2012-02-15: 30 mL via ORAL
  Filled 2012-02-15: qty 30

## 2012-02-15 MED ORDER — DOXYCYCLINE HYCLATE 100 MG PO TABS: 100.0000 mg | ORAL_TABLET | Freq: Two times a day (BID) | ORAL | Status: AC

## 2012-02-15 MED ORDER — HEPARIN SODIUM (PORCINE) 1000 UNIT/ML DIALYSIS: 100.0000 [IU]/kg | INTRAMUSCULAR | Status: DC | PRN

## 2012-02-15 MED ORDER — BOOST / RESOURCE BREEZE PO LIQD
1.0000 | ORAL | Status: DC
  Administered 2012-02-15: 1 via ORAL

## 2012-02-15 MED ORDER — METRONIDAZOLE 500 MG PO TABS: 500.0000 mg | ORAL_TABLET | Freq: Three times a day (TID) | ORAL | Status: AC

## 2012-02-15 NOTE — Progress Notes (Signed)
INITIAL ADULT NUTRITION ASSESSMENT Date: 02/15/2012   Time: 8:40 AM  Reason for Assessment: Nutrition Risk Report  ASSESSMENT: Male 56 y.o.  Dx: Hypotension  Hx:  Past Medical History  Diagnosis Date  . Renal failure   . Multiple sclerosis   . CAD (coronary artery disease)   . Aortic heart valve prolapse   . Peripheral vascular disease   . Sleep apnea     ordered bipap with 3L bled in, but pt will not wear  . Atrial fibrillation    Past Surgical History  Procedure Date  . Av fistula placement   . Flash     flash pulmonary edema  . Kidney transplant 08/2007  . Hernia repair 07/2011  . Excised squamous cells at rectum 2006   Related Meds:     . aspirin EC  81 mg Oral Daily  . calcium acetate  2,001 mg Oral TID WC  . citalopram  20 mg Oral Daily  . docusate sodium  400 mg Oral BID  . doxycycline  100 mg Oral Q12H  . folic acid  1 mg Oral Daily  . lanthanum  2,000 mg Oral BID WC  . metroNIDAZOLE  500 mg Oral Q8H  . multivitamin  1 tablet Oral Daily  . simvastatin  10 mg Oral q1800  . sodium chloride  3 mL Intravenous Q12H  . DISCONTD: ceFEPime (MAXIPIME) IV  1 g Intravenous Q12H  . DISCONTD: ceFEPime (MAXIPIME) IV  2 g Intravenous Q M,W,F-1800  . DISCONTD: pantoprazole  40 mg Oral Q1200  . DISCONTD: piperacillin-tazobactam (ZOSYN)  IV  2.25 g Intravenous Q8H  . DISCONTD: vancomycin  1,000 mg Intravenous Q M,W,F-HD   Ht: 5\' 10"  (177.8 cm)  Wt: 180 lb 1.9 oz (81.7 kg)  Ideal Wt: 78.2 kg % Ideal Wt: 104%  Usual Wt: 78 kg (per pt) % Usual Wt: 105%  Body mass index is 25.84 kg/(m^2). Pt is overweight.  Food/Nutrition Related Hx: Renal diet PTA  Labs:  CMP     Component Value Date/Time   NA 134* 02/14/2012 0415   K 4.1 02/14/2012 0415   CL 93* 02/14/2012 0415   CO2 21 02/14/2012 0415   GLUCOSE 156* 02/14/2012 0415   BUN 81* 02/14/2012 0415   CREATININE 11.03* 02/14/2012 0415   CALCIUM 7.5* 02/14/2012 0415   PROT 6.1 02/14/2012 0415   ALBUMIN 2.2* 02/14/2012  0415   AST 24 02/14/2012 0415   ALT 37 02/14/2012 0415   ALKPHOS 59 02/14/2012 0415   BILITOT 0.2* 02/14/2012 0415   GFRNONAA 5* 02/14/2012 0415   GFRAA 5* 02/14/2012 0415   Magnesium  Date/Time Value Range Status  02/13/2012  7:30 AM 1.8  1.5-2.5 (mg/dL) Final   Phosphorus  Date/Time Value Range Status  02/13/2012  7:30 AM 5.4* 2.3-4.6 (mg/dL) Final   CBG (last 3)  No results found for this basename: GLUCAP:3 in the last 72 hours   Intake/Output Summary (Last 24 hours) at 02/15/12 0842 Last data filed at 02/15/12 0606  Gross per 24 hour  Intake    480 ml  Output   2100 ml  Net  -1620 ml    Diet Order: Renal 80 - 90   Supplements/Tube Feeding: folic acid and rena-vit  IVF:    Estimated Nutritional Needs:   Kcal:  2100 - 2300 kcal Protein:  97 - 110 grams protein Fluid:  1.2 liters daily  Admitted with fever and fatigue. C-diff positive. Receives HD MWF in [place].  Pt reports  intake was poor for 3 days when ill and having diarrhea. Pt reports intake is now improving, ate approximately 50% of breakfast. States that he sees his RD at the HD center regularly and tries to follow the renal diet to the best of his ability. States that he takes Prostat at the end of his HD treatments. Agreeable to receiving while here. Denies any significant wt or appetite changes prior to this acute illness.  NUTRITION DIAGNOSIS: -Inadequate oral intake (NI-2.1).  Status: Ongoing  RELATED TO: variable appetite and GI distress  AS EVIDENCE BY: pt report  MONITORING/EVALUATION(Goals): Goal: Pt to consume at least 75% of meals and supplements. Monitor: weights, labs, PO intake, I/O's  EDUCATION NEEDS: -No education needs identified at this time  INTERVENTION: 1. 30 ml Prostat PO BID daily to help meet protein needs 2. Resource Breeze PO daily for additional kcal and protein 3. RD to continue to follow nutrition care plan  Dietitian #: [phone]  DOCUMENTATION CODES Per approved  criteria  -Not Applicable    Adair Laundry 02/15/2012, 8:40 AM

## 2012-02-15 NOTE — Progress Notes (Signed)
Patient has had persistent neck pain through the night, with barely any relief from pain meds.  Pt and his wife are very concerned with this. Pt states it is not just a "crick" and that "there has to be something else going on." Pain meds given which only slightly help. Will continue to monitor.

## 2012-02-15 NOTE — Progress Notes (Signed)
This was a follow-up visit.  Pt,called [name],  is feeling much better.  He is excited because he is being discharged today.  His wife, Ander Slade, was at bedside.  They were very impressed with [facility] dialysis center and renal floor. We discussed pt's illness and renal disease. I provided emotional and spiritual support. Dellie Catholic  [phone] personal pager

## 2012-02-15 NOTE — Progress Notes (Signed)
I have seen and examined this patient and agree with the plan of care. .  [name] W 02/15/2012, 11:42 AM   

## 2012-02-15 NOTE — Discharge Summary (Signed)
Physician Discharge Summary  Patient ID: [name] MRN: [id number] DOB/AGE: 1956/01/04 56 y.o.  Admit date: 02/12/2012 Discharge date: 02/15/2012  Admission Diagnoses: Present on Admission:  .ESRD (end stage renal disease)  .Fever   .Hypotension  .Diarrhea  .Syncope and collapse  .Aortic valve disease   Discharge Diagnoses:  Principal Problem:  *Hypotension Active Problems:  ESRD (end stage renal disease)  Fever  Thrombocytopenia  Diarrhea  Syncope and collapse  Aortic valve disease  Renal transplant failure and rejection  Multiple sclerosis  C. difficile diarrhea  Discharged Condition:good  Hospital Course:  Hypotension/ momentary syncope  Due to dehydration w/ associated lactic acidosis - with slow hydration (IVF and IV antibiotics) his BP has improved, his BPs are good on no BP meds, he was asked to hold his BP meds until he follow up with nephrology and /or PCP and given instructions to restart metoprolol.    C diff colitis with SIRS  Improving diarrhea at discharge, this is likely the primary event leading to his presenting symptoms - pt is much improved with just hydration - will treat with oral flagyl and probiotics for a full 14 day course of treatment  ?RMSF  Pt is apparently being tx empirically for the possibility of RMSF - I find little convincing evidence of such clinically - will continue doxycycline for full course.  RMSF IgG titer will not likely be helpful as true diagnosis is only made with a convalescent titer, not during acute infection   ESRD (end stage renal disease) s/p failed renal transplant  Nephrology is following - on a M/W/F schedule   Thrombocytopenia  Improving - now 143,  no evidence of spontaneous bleeding - cont to follow trend with PCP at follow up appointment with recheck CBC  Aortic valve disease  Had an episode of flash pulmonary edema in April - was told his valve may need repair [facility]) - we have requested records -  hemodynamics improving with simple hydration at this time, Follow up with PCP.   Valvular atrial fibrillation  Rate controlled - awaiting records from [facility] - does not appear to be on long term anticoag as outpt   MS  No evidence of acute flair - does not appear to be on chronic immune modifying meds   Dispo  Stable for discharge home with close outpatient follow up  Consults:  nephrology  Discharge Exam: Pt asking to go home. Says that he has not had diarrhea since last night.  He says he feels better.  He is eating well.  He denies nausea and has no vomiting.  Tolerating oral antibiotics well.  Blood pressure 126/60, pulse 78, temperature 97.8 F (36.6 C), temperature source Oral, resp. rate 18, height 5\' 10"  (1.778 m), weight 81.7 kg (180 lb 1.9 oz), SpO2 94.00%. General: No acute respiratory distress  Lungs: Clear to auscultation bilaterally without wheezes or crackles  Cardiovascular: Regular rate and rhythm without murmur gallop or rub normal S1 and S2  Abdomen: Nontender, nondistended, soft, bowel sounds positive, no rebound, no ascites, no appreciable mass  Extremities: No significant cyanosis, clubbing, or edema bilateral lower extremities  Disposition: Home with close outpatient follow up with pcp and nephrologist  Discharge Orders    Future Orders Please Complete By Expires   Diet - low sodium heart healthy      Increase activity slowly      Discharge instructions      Comments:   DON'T TAKE YOUR METOPROLOL UNTIL YOU FOLLOW UP  WITH YOUR KIDNEY DOCTOR AND THEY TELL YOU TO START TAKING IT AGAIN PLEASE CHECK YOUR BLOOD PRESSURE AT LEAST ONCE DAILY AND WRITE DOWN AND BRING TO OFFICE VISIT WITH YOUR PRIMARY DOCTOR AND NEPHROLOGIST. TAKE THE METRONIDAZOLE FOR FULL 14 DAYS AS DIRECTED TO TREAT THE C. DIFF INFECTION TAKE A PROBIOTIC 2-3 TIMES PER DAY TO RESTORE HEALTHY BACTERIA TO YOUR INTESTINES   RETURN IF SYMPTOMS RECUR, WORSEN OR NEW PROBLEMS DEVELOP.        Medication List  As of 02/15/2012  2:28 PM   STOP taking these medications         levofloxacin 500 MG tablet      metoprolol 50 MG tablet         TAKE these medications         aspirin EC 81 MG tablet   Take 81 mg by mouth daily.      b complex-vitamin c-folic acid 0.8 MG Tabs   Take 0.8 mg by mouth daily.      calcium acetate 667 MG capsule   Commonly known as: PHOSLO   Take 2,001 mg by mouth 3 (three) times daily with meals.      citalopram 20 MG tablet   Commonly known as: CELEXA   Take 20 mg by mouth daily.      docusate sodium 100 MG capsule   Commonly known as: COLACE   Take 400 mg by mouth 2 (two) times daily.      doxycycline 100 MG tablet   Commonly known as: VIBRA-TABS   Take 1 tablet (100 mg total) by mouth every 12 (twelve) hours.      folic acid 1 MG tablet   Commonly known as: FOLVITE   Take 1 mg by mouth daily.      gabapentin 300 MG capsule   Commonly known as: NEURONTIN   Take 300 mg by mouth every hemodialysis. Take on Monday, Wednesday, and Friday      lanthanum 1000 MG chewable tablet   Commonly known as: FOSRENOL   Chew 2,000 mg by mouth 2 (two) times daily with a meal.      metroNIDAZOLE 500 MG tablet   Commonly known as: FLAGYL   Take 1 tablet (500 mg total) by mouth every 8 (eight) hours.      omeprazole 20 MG tablet   Commonly known as: PRILOSEC OTC   Take 20 mg by mouth daily.      simvastatin 20 MG tablet   Commonly known as: ZOCOR   Take 10 mg by mouth daily.           Follow-up Information    Follow up with DOUGH,ROBERT, MD. Schedule an appointment as soon as possible for a visit in 1 week. [facility] Follow Up)    Contact information:   [address] [place] [address] [phone]       Follow up with Hemodialysis MWF as scheduled. Schedule an appointment as soon as possible for a visit in 1 day. (Go in for your regularly scheduled treatment)         I spent 45 mins preparing discharge, reviewing  records, labs, consult notes, reconciling medications, arranging for follow up, etc.  Signed: Juelle Dickmann 02/15/2012, 2:28 PM Pager [phone] [facility] [place], [place]

## 2012-02-15 NOTE — Discharge Instructions (Signed)
Clostridium Difficile Infection Clostridium difficile (C. diff) is a germ found in the intestines. C. diff infection can occur after taking antibiotic medicines. C. diff infection can cause watery poop (diarrhea) or severe disease. HOME CARE   Drink enough fluids to keep your pee (urine) clear or pale yellow. Avoid milk, caffeine, and alcohol.   Ask your doctor how to replace body fluid losses (rehydrate).   Eat small meals more often rather than large meals.   Take your medicine (antibiotics) as told. Finish it even if you start to feel better.   Do not  use medicines to slow the watery poop.   Wash your hands well after using the bathroom and before preparing food.   Make sure people who live with you wash their hands often.  GET HELP RIGHT AWAY IF:   The watery poop does not stop, or it comes back after you finish your medicine.   You feel very dry or thirsty (dehydrated).   You have a fever.   You have more belly (abdominal) pain or tenderness.   There is blood in your poop (stool), or your poop is black and tar-like.   You cannot eat food or drink liquids without throwing up (vomiting).  MAKE SURE YOU:   Understand these instructions.   Will watch your condition.   Will get help right away if you are not doing well or get worse.  Document Released: 06/20/2009 Document Revised: 08/12/2011 Document Reviewed: 01/29/2011 [facility] Patient Information 2012 [place], [place].  Antibiotic-Associated Diarrhea You or your child's diarrhea is due to taking an antibiotic medicine. This is a common reaction to these drugs. It does not mean you are allergic to the medicine. The diarrhea is usually not severe. The stools will return to normal several days after completing the antibiotic treatment. If a diaper rash occurs, you can wash the irritated area carefully. Then apply a cream or an ointment to protect the skin. Normally it is best to complete the antibiotic treatment. To reduce  symptoms avoid:  Apple and grape fruit juices.   Dairy products.   Beans.  Encourage plenty of clear fluids, such as water or sports drinks. Cultured dairy products such as yogurt may help restore normal intestinal bacteria. Medicines to control the diarrhea may help reduce symptoms. But these should not be used if the stool is bloody.  SEEK IMMEDIATE MEDICAL CARE IF:   Diarrhea does not improve after completing the antibiotic medicine.   You notice a fever, bloody stools, increased pain, or vomiting.  Document Released: 09/30/2004 Document Revised: 08/12/2011 Document Reviewed: 03/28/2008 [facility] Patient Information 2012 [place], [place].   Return to ER if symptoms recur, worsen or new problems develop.

## 2012-02-15 NOTE — Progress Notes (Signed)
Subjective:  Feeling better, no dizziness, no dyspnea, but with loose stools.  Objective: Vital signs in last 24 hours: Temp:  [97.3 F (36.3 C)-100.8 F (38.2 C)] 98.8 F (37.1 C) (06/11 0549) Pulse Rate:  [74-113] 77  (06/11 0549) Resp:  [12-23] 17  (06/11 0549) BP: (99-135)/(28-57) 135/55 mmHg (06/11 0549) SpO2:  [88 %-96 %] 88 % (06/11 0549) Weight:  [81 kg (178 lb 9.2 oz)-82 kg (180 lb 12.4 oz)] 81.7 kg (180 lb 1.9 oz) (06/10 2100) Weight change: 0 kg (0 lb)  Intake/Output from previous day: 06/10 0701 - 06/11 0700 In: 720 [P.O.:720] Out: 2100    EXAM: General appearance:  Alert, in no apparent distress Resp: CTA bilaterally Cardio:  RRR without murmur GI: + BS, soft and nontender Extremities: No edema Access:  AVF @ LFA with + bruit  Lab Results:  Basename 02/15/12 0500 02/14/12 0415  WBC 11.7* 11.9*  HGB 10.0* 10.4*  HCT 29.5* 30.4*  PLT 143* 118*   BMET:  Basename 02/14/12 0415 02/13/12 0730  NA 134* 136  K 4.1 4.0  CL 93* 93*  CO2 21 21  GLUCOSE 156* 97  BUN 81* 69*  CREATININE 11.03* 9.79*  CALCIUM 7.5* 7.8*  ALBUMIN 2.2* 2.6*   No results found for this basename: PTH:2 in the last 72 hours Iron Studies: No results found for this basename: IRON,TIBC,TRANSFERRIN,FERRITIN in the last 72 hours  Assessment/Plan: 1.    C diff coliitis - with fever (now 98.8) and dizziness secondary to dehydration; improving on PO Flagyl.  2.    ESRD - HD on MWF at [place], K 4.1 on 1K bath at center.  HD tomorrow. 3.    Anemia - Hgb 10, no outpatient Epogen.  Recheck pre-HD tomorrow. 4.    HTN/Volume - BP most recently 135/55 on no meds; wt 81.7 kg post-HD yesterday s/p net UF of 2.1 L. 5.  Secondary hyperparathyroidism - Ca 7.5 (8.7 corrected), P 5.4 on Fosrenol 2 g and Phoslo 3 with meals, no Zemplar.  6.    ?RMSF - treated empirically with PO Doxycycline.   LOS: 3 days   Marcellus Pulliam 02/15/2012,8:44 AM

## 2012-02-17 ENCOUNTER — Other Ambulatory Visit: Payer: Self-pay | Admitting: Nephrology

## 2012-02-17 ENCOUNTER — Encounter: Payer: Self-pay | Admitting: Nephrology

## 2012-02-17 LAB — STOOL CULTURE

## 2012-02-18 LAB — CULTURE, BLOOD (ROUTINE X 2)
Culture  Setup Time: 201306082006
Culture  Setup Time: 201306082006
Culture: NO GROWTH
Culture: NO GROWTH

## 2012-05-18 DIAGNOSIS — M542 Cervicalgia: Secondary | ICD-10-CM | POA: Insufficient documentation

## 2012-11-12 ENCOUNTER — Inpatient Hospital Stay (HOSPITAL_COMMUNITY)
Admission: EM | Admit: 2012-11-12 | Discharge: 2012-11-16 | DRG: 871 | Disposition: A | Discharge status: Home / Self Care | Payer: Medicare Other | Attending: Internal Medicine | Admitting: Internal Medicine

## 2012-11-12 ENCOUNTER — Encounter (HOSPITAL_COMMUNITY): Payer: Self-pay | Admitting: Family Medicine

## 2012-11-12 ENCOUNTER — Emergency Department (HOSPITAL_COMMUNITY): Payer: Medicare Other

## 2012-11-12 DIAGNOSIS — G35D Multiple sclerosis, unspecified: Secondary | ICD-10-CM | POA: Diagnosis present

## 2012-11-12 DIAGNOSIS — R55 Syncope and collapse: Secondary | ICD-10-CM

## 2012-11-12 DIAGNOSIS — B9689 Other specified bacterial agents as the cause of diseases classified elsewhere: Secondary | ICD-10-CM | POA: Diagnosis present

## 2012-11-12 DIAGNOSIS — A0472 Enterocolitis due to Clostridium difficile, not specified as recurrent: Secondary | ICD-10-CM

## 2012-11-12 DIAGNOSIS — G35 Multiple sclerosis: Secondary | ICD-10-CM | POA: Diagnosis present

## 2012-11-12 DIAGNOSIS — D696 Thrombocytopenia, unspecified: Secondary | ICD-10-CM

## 2012-11-12 DIAGNOSIS — N186 End stage renal disease: Secondary | ICD-10-CM | POA: Diagnosis present

## 2012-11-12 DIAGNOSIS — D631 Anemia in chronic kidney disease: Secondary | ICD-10-CM | POA: Diagnosis present

## 2012-11-12 DIAGNOSIS — R7881 Bacteremia: Principal | ICD-10-CM | POA: Diagnosis present

## 2012-11-12 DIAGNOSIS — I120 Hypertensive chronic kidney disease with stage 5 chronic kidney disease or end stage renal disease: Secondary | ICD-10-CM | POA: Diagnosis present

## 2012-11-12 DIAGNOSIS — R197 Diarrhea, unspecified: Secondary | ICD-10-CM

## 2012-11-12 DIAGNOSIS — I359 Nonrheumatic aortic valve disorder, unspecified: Secondary | ICD-10-CM | POA: Diagnosis present

## 2012-11-12 DIAGNOSIS — T8612 Kidney transplant failure: Secondary | ICD-10-CM | POA: Diagnosis present

## 2012-11-12 DIAGNOSIS — I959 Hypotension, unspecified: Secondary | ICD-10-CM

## 2012-11-12 DIAGNOSIS — I351 Nonrheumatic aortic (valve) insufficiency: Secondary | ICD-10-CM | POA: Diagnosis present

## 2012-11-12 DIAGNOSIS — T8610 Unspecified complication of kidney transplant: Secondary | ICD-10-CM

## 2012-11-12 DIAGNOSIS — N2581 Secondary hyperparathyroidism of renal origin: Secondary | ICD-10-CM | POA: Diagnosis present

## 2012-11-12 DIAGNOSIS — T8611 Kidney transplant rejection: Secondary | ICD-10-CM

## 2012-11-12 DIAGNOSIS — Z940 Kidney transplant status: Secondary | ICD-10-CM

## 2012-11-12 DIAGNOSIS — R509 Fever, unspecified: Secondary | ICD-10-CM | POA: Diagnosis present

## 2012-11-12 HISTORY — DX: End stage renal disease: N18.6

## 2012-11-12 LAB — CBC WITH DIFFERENTIAL/PLATELET
Basophils Relative: 0 % (ref 0–1)
Eosinophils Absolute: 0 10*3/uL (ref 0.0–0.7)
HCT: 36.5 % — ABNORMAL LOW (ref 39.0–52.0)
Hemoglobin: 12.2 g/dL — ABNORMAL LOW (ref 13.0–17.0)
MCH: 33.3 pg (ref 26.0–34.0)
MCHC: 33.4 g/dL (ref 30.0–36.0)
MCV: 99.7 fL (ref 78.0–100.0)
Monocytes Absolute: 0.5 10*3/uL (ref 0.1–1.0)
Monocytes Relative: 5 % (ref 3–12)

## 2012-11-12 LAB — COMPREHENSIVE METABOLIC PANEL
Albumin: 3.1 g/dL — ABNORMAL LOW (ref 3.5–5.2)
BUN: 65 mg/dL — ABNORMAL HIGH (ref 6–23)
Chloride: 93 mEq/L — ABNORMAL LOW (ref 96–112)
Creatinine, Ser: 10.56 mg/dL — ABNORMAL HIGH (ref 0.50–1.35)
Total Bilirubin: 0.3 mg/dL (ref 0.3–1.2)
Total Protein: 7.6 g/dL (ref 6.0–8.3)

## 2012-11-12 MED ORDER — ACETAMINOPHEN 325 MG PO TABS
650.0000 mg | ORAL_TABLET | Freq: Once | ORAL | Status: AC
  Administered 2012-11-12: 650 mg via ORAL
  Filled 2012-11-12: qty 2

## 2012-11-12 MED ORDER — SODIUM CHLORIDE 0.9 % IJ SOLN
3.0000 mL | Freq: Two times a day (BID) | INTRAMUSCULAR | Status: DC
  Administered 2012-11-13 – 2012-11-16 (×5): 3 mL via INTRAVENOUS

## 2012-11-12 MED ORDER — SIMVASTATIN 10 MG PO TABS: 10.0000 mg | ORAL_TABLET | Freq: Every day | ORAL | Status: DC

## 2012-11-12 MED ORDER — ASPIRIN EC 81 MG PO TBEC
81.0000 mg | DELAYED_RELEASE_TABLET | Freq: Every day | ORAL | Status: DC
  Administered 2012-11-13 – 2012-11-16 (×5): 81 mg via ORAL
  Filled 2012-11-12 (×4): qty 1

## 2012-11-12 MED ORDER — CITALOPRAM HYDROBROMIDE 20 MG PO TABS: 20.0000 mg | ORAL_TABLET | Freq: Every day | ORAL | Status: DC

## 2012-11-12 MED ORDER — SODIUM CHLORIDE 0.9 % IV SOLN: 250.0000 mL | INTRAVENOUS | Status: DC | PRN

## 2012-11-12 MED ORDER — DOCUSATE SODIUM 100 MG PO CAPS
400.0000 mg | ORAL_CAPSULE | Freq: Two times a day (BID) | ORAL | Status: DC
  Administered 2012-11-13 – 2012-11-16 (×3): 400 mg via ORAL
  Filled 2012-11-12 (×3): qty 4
  Filled 2012-11-12: qty 1
  Filled 2012-11-12 (×3): qty 4
  Filled 2012-11-12: qty 3
  Filled 2012-11-12 (×2): qty 4

## 2012-11-12 MED ORDER — FOLIC ACID 1 MG PO TABS
1.0000 mg | ORAL_TABLET | Freq: Every day | ORAL | Status: DC
  Administered 2012-11-13 – 2012-11-16 (×4): 1 mg via ORAL
  Filled 2012-11-12 (×4): qty 1

## 2012-11-12 MED ORDER — LEVOFLOXACIN IN D5W 500 MG/100ML IV SOLN
500.0000 mg | INTRAVENOUS | Status: DC
  Administered 2012-11-14: 500 mg via INTRAVENOUS
  Filled 2012-11-12 (×2): qty 100

## 2012-11-12 MED ORDER — METOPROLOL TARTRATE 50 MG PO TABS
50.0000 mg | ORAL_TABLET | ORAL | Status: DC
  Administered 2012-11-14 – 2012-11-16 (×2): 50 mg via ORAL
  Filled 2012-11-12 (×2): qty 1

## 2012-11-12 MED ORDER — SODIUM CHLORIDE 0.9 % IJ SOLN
3.0000 mL | INTRAMUSCULAR | Status: DC | PRN
  Administered 2012-11-16: 3 mL via INTRAVENOUS

## 2012-11-12 MED ORDER — CALCIUM ACETATE 667 MG PO CAPS
2001.0000 mg | ORAL_CAPSULE | Freq: Three times a day (TID) | ORAL | Status: DC
  Administered 2012-11-13 – 2012-11-16 (×9): 2001 mg via ORAL
  Filled 2012-11-12 (×13): qty 3

## 2012-11-12 MED ORDER — LANTHANUM CARBONATE 500 MG PO CHEW
2000.0000 mg | CHEWABLE_TABLET | ORAL | Status: DC | PRN
  Filled 2012-11-12: qty 4

## 2012-11-12 MED ORDER — LANTHANUM CARBONATE 500 MG PO CHEW
2000.0000 mg | CHEWABLE_TABLET | Freq: Three times a day (TID) | ORAL | Status: DC
  Administered 2012-11-13 – 2012-11-14 (×6): 2000 mg via ORAL
  Filled 2012-11-12 (×10): qty 4

## 2012-11-12 MED ORDER — LEVOFLOXACIN IN D5W 750 MG/150ML IV SOLN: 750.0000 mg | Freq: Once | INTRAVENOUS | Status: DC

## 2012-11-12 MED ORDER — METOPROLOL TARTRATE 25 MG PO TABS
25.0000 mg | ORAL_TABLET | ORAL | Status: DC
  Administered 2012-11-13 – 2012-11-15 (×4): 25 mg via ORAL
  Filled 2012-11-12 (×7): qty 1

## 2012-11-12 MED ORDER — LEVOFLOXACIN IN D5W 750 MG/150ML IV SOLN
750.0000 mg | Freq: Once | INTRAVENOUS | Status: AC
  Administered 2012-11-12: 750 mg via INTRAVENOUS
  Filled 2012-11-12: qty 150

## 2012-11-12 MED ORDER — METOPROLOL TARTRATE 50 MG PO TABS: 50.0000 mg | ORAL_TABLET | Freq: Two times a day (BID) | ORAL | Status: DC

## 2012-11-12 NOTE — ED Notes (Addendum)
Pt had a fever earlier today and has been taking tylenol to treat. Afebrile at the moment but pt warm to touch. Denies any pain. Dr. Anitra Lauth at bedside. Pt states he had a productive cough that gets worse at night with clear sputum.

## 2012-11-12 NOTE — ED Notes (Signed)
Pt fever 102 last night. Pt is dialysis pt. Being treated with tylenol but not helping. Pt last dialysis Friday. Pt sts also cough and for 2 weeks has been fighting cold. sts also right side pain. Access in left arm.

## 2012-11-12 NOTE — H&P (Signed)
PCP:   DOUGH,ROBERT, MD   Chief Complaint:  fever  HPI: 57 yo male esrd dialysis m/w/f on diaylsis over 10 years, had membranous glomerapnephropathy s/p transfplant then subsequently developed same issue in his transplant kidney and rejected about one month postop is otherwise overall healthy comes in with fever for one day.  Coughing for about one week with some nasal congestion and body aches.  No n/v no diarrhea.  No cp/sob/abd pain.  No rashes, no le edema or swelling.  Not on abx frequently.  Review of Systems:  Positive and negative as per HPI otherwise all other systems are negative  Past Medical History: Past Medical History  Diagnosis Date  . Renal failure   . Multiple sclerosis   . CAD (coronary artery disease)   . Aortic heart valve prolapse   . Peripheral vascular disease   . Sleep apnea     ordered bipap with 3L bled in, but pt will not wear  . Atrial fibrillation   . Multiple sclerosis   . Renal transplant failure and rejection 02/14/2012   Past Surgical History  Procedure Laterality Date  . Av fistula placement    . Flash      flash pulmonary edema  . Kidney transplant  08/2007  . Hernia repair  07/2011  . Excised squamous cells at rectum  2006    Medications: Prior to Admission medications   Medication Sig Start Date End Date Taking? Authorizing Kaliyah Gladman  acetaminophen (TYLENOL) 500 MG tablet Take 1,000 mg by mouth every 6 (six) hours as needed for pain or fever.   Yes Historical Anselm Aumiller, MD  aspirin EC 81 MG tablet Take 81 mg by mouth daily.   Yes Historical Jory Welke, MD  b complex-vitamin c-folic acid (NEPHRO-VITE) 0.8 MG TABS Take 0.8 mg by mouth daily.    Yes Historical Kaye Mitro, MD  calcium acetate (PHOSLO) 667 MG capsule Take 2,001 mg by mouth 3 (three) times daily with meals.   Yes Historical Leovanni Bjorkman, MD  citalopram (CELEXA) 20 MG tablet Take 20 mg by mouth daily.   Yes Historical Markiyah Gahm, MD  docusate sodium (COLACE) 100 MG capsule Take 400 mg  by mouth 2 (two) times daily.   Yes Historical Nikisha Fleece, MD  folic acid (FOLVITE) 1 MG tablet Take 1 mg by mouth daily.   Yes Historical Malyah Ohlrich, MD  gabapentin (NEURONTIN) 300 MG capsule Take 300 mg by mouth every hemodialysis. Take on Monday, Wednesday, and Friday   Yes Historical Chayah Mckee, MD  lanthanum (FOSRENOL) 1000 MG chewable tablet Chew 2,000 mg by mouth 3 (three) times daily. Takes with meals and snacks   Yes Historical Zianne Schubring, MD  metoprolol (LOPRESSOR) 50 MG tablet Take 50 mg by mouth 2 (two) times daily. 25mg  twice daily on dialysis days (M,W,F) Non-diaylsis days: 50mg  in the morning 25mg  in the evening   Yes Historical Louvina Cleary, MD  omeprazole (PRILOSEC OTC) 20 MG tablet Take 20 mg by mouth 2 (two) times daily.    Yes Historical Gen Clagg, MD  Probiotic Product (PROBIOTIC PO) Take 1 capsule by mouth 2 (two) times daily.   Yes Historical Nina Mondor, MD  simvastatin (ZOCOR) 20 MG tablet Take 10 mg by mouth daily.   Yes Historical Chao Blazejewski, MD    Allergies:   Allergies  Allergen Reactions  . Penicillins Other (See Comments)    migraine  . Adhesive (Tape) Rash    Please use paper tape    Social History:  reports that he quit smoking about 14 years ago.  His smoking use included Cigarettes. He has a 40 pack-year smoking history. He does not have any smokeless tobacco history on file. He reports that he does not drink alcohol or use illicit drugs.  Family History: History reviewed. No pertinent family history.  Physical Exam: Filed Vitals:   11/12/12 1643  BP: 149/54  Pulse: 68  Temp: 98.4 F (36.9 C)  Resp: 18  SpO2: 97%   General appearance: alert, cooperative and no distress Eyes: negative Nose: Nares normal. Septum midline. Mucosa normal. No drainage or sinus tenderness. Neck: no carotid bruit, no JVD, supple, symmetrical, trachea midline and thyroid not enlarged, symmetric, no tenderness/mass/nodules Lungs: clear to auscultation bilaterally Chest wall: no  tenderness Heart: regular rate and rhythm, S1, S2 normal, no murmur, click, rub or gallop Abdomen: soft, non-tender; bowel sounds normal; no masses,  no organomegaly Extremities: extremities normal, atraumatic, no cyanosis or edema Pulses: 2+ and symmetric Skin: Skin color, texture, turgor normal. No rashes or lesions Neurologic: Grossly normal    Labs on Admission:   Recent Labs  11/12/12 1646  NA 137  K 5.0  CL 93*  CO2 28  GLUCOSE 83  BUN 65*  CREATININE 10.56*  CALCIUM 9.1    Recent Labs  11/12/12 1646  AST 21  ALT 16  ALKPHOS 60  BILITOT 0.3  PROT 7.6  ALBUMIN 3.1*    Recent Labs  11/12/12 1646  WBC 9.6  NEUTROABS 8.5*  HGB 12.2*  HCT 36.5*  MCV 99.7  PLT 151   Radiological Exams on Admission: Dg Chest 2 View  11/12/2012  *RADIOLOGY REPORT*  Clinical Data: Fever, cough, congestion, hypertension, atrial fibrillation, end-stage renal disease on dialysis  CHEST - 2 VIEW  Comparison: 02/12/2012  Findings: Enlargement of cardiac silhouette with pulmonary vascular congestion. Calcified tortuous aorta. Lungs clear. No pleural effusion or pneumothorax. Bones unremarkable.  IMPRESSION: Enlargement of cardiac silhouette with pulmonary vascular congestion. No definite acute infiltrate.   Original Report Authenticated By: Ulyses Southward, M.D.     Assessment/Plan  57 yo male esrd with fever unclear origin Principal Problem:   Fever Active Problems:   ESRD (end stage renal disease)   Aortic valve disease   Renal transplant failure and rejection   Multiple sclerosis  Fever likely from bronchitis, cxr neg.  Place on levaquin.  bc are pending.  obs overnight.  Ck lactic acid level.  Message left for dialysis unit for him to get dialysis in am.  Med bed full code.  DAVID,RACHAL A 11/12/2012, 7:57 PM

## 2012-11-12 NOTE — ED Provider Notes (Addendum)
History     CSN: [id number]  Arrival date & time 11/12/12  1634   First MD Initiated Contact with Patient 11/12/12 1805      Chief Complaint  Patient presents with  . Fever    (Consider location/radiation/quality/duration/timing/severity/associated sxs/prior treatment) Patient is a 57 y.o. male presenting with fever. The history is provided by the patient and the spouse.  Fever Max temp prior to arrival:  102.9 Temp source:  Oral Severity:  Moderate Onset quality:  Sudden Duration:  1 day Timing:  Constant Progression:  Unchanged Chronicity:  New Relieved by:  Acetaminophen Worsened by:  Nothing tried Ineffective treatments:  None tried Associated symptoms: congestion, cough and rhinorrhea   Associated symptoms: no chest pain, no diarrhea, no headaches, no nausea, no sore throat and no vomiting   Risk factors comment:  Dialysis patient   Past Medical History  Diagnosis Date  . Renal failure   . Multiple sclerosis   . CAD (coronary artery disease)   . Aortic heart valve prolapse   . Peripheral vascular disease   . Sleep apnea     ordered bipap with 3L bled in, but pt will not wear  . Atrial fibrillation   . Multiple sclerosis   . Renal transplant failure and rejection 02/14/2012    Past Surgical History  Procedure Laterality Date  . Av fistula placement    . Flash      flash pulmonary edema  . Kidney transplant  08/2007  . Hernia repair  07/2011  . Excised squamous cells at rectum  2006    History reviewed. No pertinent family history.  History  Substance Use Topics  . Smoking status: Former Smoker -- 2.00 packs/day for 20 years    Types: Cigarettes    Quit date: 08/06/1998  . Smokeless tobacco: Not on file  . Alcohol Use: No      Review of Systems  Constitutional: Positive for fever.  HENT: Positive for congestion and rhinorrhea. Negative for sore throat.   Respiratory: Positive for cough.   Cardiovascular: Negative for chest pain.   Gastrointestinal: Negative for nausea, vomiting and diarrhea.  Neurological: Negative for headaches.  All other systems reviewed and are negative.    Allergies  Penicillins and Adhesive  Home Medications   Current Outpatient Rx  Name  Route  Sig  Dispense  Refill  . acetaminophen (TYLENOL) 500 MG tablet   Oral   Take 1,000 mg by mouth every 6 (six) hours as needed for pain or fever.         Marland Kitchen aspirin EC 81 MG tablet   Oral   Take 81 mg by mouth daily.         Marland Kitchen b complex-vitamin c-folic acid (NEPHRO-VITE) 0.8 MG TABS   Oral   Take 0.8 mg by mouth daily.          . calcium acetate (PHOSLO) 667 MG capsule   Oral   Take 2,001 mg by mouth 3 (three) times daily with meals.         . citalopram (CELEXA) 20 MG tablet   Oral   Take 20 mg by mouth daily.         Marland Kitchen docusate sodium (COLACE) 100 MG capsule   Oral   Take 400 mg by mouth 2 (two) times daily.         . folic acid (FOLVITE) 1 MG tablet   Oral   Take 1 mg by mouth daily.         Marland Kitchen  gabapentin (NEURONTIN) 300 MG capsule   Oral   Take 300 mg by mouth every hemodialysis. Take on Monday, Wednesday, and Friday         . lanthanum (FOSRENOL) 1000 MG chewable tablet   Oral   Chew 2,000 mg by mouth 3 (three) times daily. Takes with meals and snacks         . metoprolol (LOPRESSOR) 50 MG tablet   Oral   Take 50 mg by mouth 2 (two) times daily. 25mg  twice daily on dialysis days (M,W,F) Non-diaylsis days: 50mg  in the morning 25mg  in the evening         . omeprazole (PRILOSEC OTC) 20 MG tablet   Oral   Take 20 mg by mouth 2 (two) times daily.          . Probiotic Product (PROBIOTIC PO)   Oral   Take 1 capsule by mouth 2 (two) times daily.         . simvastatin (ZOCOR) 20 MG tablet   Oral   Take 10 mg by mouth daily.           BP 149/54  Pulse 68  Temp(Src) 98.4 F (36.9 C)  Resp 18  SpO2 97%  Physical Exam  Nursing note and vitals reviewed. Constitutional: He is oriented to  person, place, and time. He appears well-developed and well-nourished. No distress.  HENT:  Head: Normocephalic and atraumatic.  Mouth/Throat: Oropharynx is clear and moist.  Eyes: Conjunctivae and EOM are normal. Pupils are equal, round, and reactive to light.  Neck: Normal range of motion. Neck supple.  Cardiovascular: Normal rate, regular rhythm and intact distal pulses.   No murmur heard. Pulmonary/Chest: Effort normal. No respiratory distress. He has wheezes in the right upper field and the left upper field. He has no rales.  Abdominal: Soft. He exhibits no distension. There is no tenderness. There is no rebound and no guarding.  Musculoskeletal: Normal range of motion. He exhibits no edema and no tenderness.  Fistula with a palpable thrill in the left upper extremity  Neurological: He is alert and oriented to person, place, and time.  Skin: Skin is warm and dry. No rash noted. No erythema.  Psychiatric: He has a normal mood and affect. His behavior is normal.    ED Course  Procedures (including critical care time)  Labs Reviewed  CBC WITH DIFFERENTIAL - Abnormal; Notable for the following:    RBC 3.66 (*)    Hemoglobin 12.2 (*)    HCT 36.5 (*)    Neutrophils Relative 88 (*)    Neutro Abs 8.5 (*)    Lymphocytes Relative 6 (*)    Lymphs Abs 0.6 (*)    All other components within normal limits  COMPREHENSIVE METABOLIC PANEL - Abnormal; Notable for the following:    Chloride 93 (*)    BUN 65 (*)    Creatinine, Ser 10.56 (*)    Albumin 3.1 (*)    GFR calc non Af Amer 5 (*)    GFR calc Af Amer 5 (*)    All other components within normal limits   Dg Chest 2 View  11/12/2012  *RADIOLOGY REPORT*  Clinical Data: Fever, cough, congestion, hypertension, atrial fibrillation, end-stage renal disease on dialysis  CHEST - 2 VIEW  Comparison: 02/12/2012  Findings: Enlargement of cardiac silhouette with pulmonary vascular congestion. Calcified tortuous aorta. Lungs clear. No pleural  effusion or pneumothorax. Bones unremarkable.  IMPRESSION: Enlargement of cardiac silhouette with pulmonary vascular congestion. No  definite acute infiltrate.   Original Report Authenticated By: Ulyses Southward, M.D.      1. Fever of unknown origin       MDM   Patient presenting with 2 days of fever, cough. He denies any shortness of breath but has no wheezing at night. He is a dialysis patient but currently is on no immunosuppressive medications. Vital signs are stable here today he has decreased breath sounds and mild wheezing in the upper lobes. CBC within normal limits with a stable hemoglobin. CMP with elevated creatinine but normal potassium. Chest x-ray is pending.  7:41 PM Chest x-ray negative for any definitive infiltrate. He states he has had a flu shot this year however a flu PCR was sent. Blood cultures were drawn the given patient is a dialysis patient has a fever of unknown origin we'll discuss with the hospitalist.  8:01 PM Due to well appearance patient was started on Levaquin per pharmacy dosing and admitted for further evaluation    Gwyneth Sprout, MD 11/12/12 2001  Gwyneth Sprout, MD 11/12/12 2002

## 2012-11-13 ENCOUNTER — Encounter (HOSPITAL_COMMUNITY): Payer: Self-pay | Admitting: *Deleted

## 2012-11-13 DIAGNOSIS — Z992 Dependence on renal dialysis: Secondary | ICD-10-CM

## 2012-11-13 DIAGNOSIS — G35 Multiple sclerosis: Secondary | ICD-10-CM

## 2012-11-13 LAB — CBC
MCH: 33.1 pg (ref 26.0–34.0)
MCV: 97.9 fL (ref 78.0–100.0)
Platelets: 137 10*3/uL — ABNORMAL LOW (ref 150–400)
RDW: 14.1 % (ref 11.5–15.5)

## 2012-11-13 LAB — BASIC METABOLIC PANEL
BUN: 82 mg/dL — ABNORMAL HIGH (ref 6–23)
CO2: 21 mEq/L (ref 19–32)
Calcium: 8.8 mg/dL (ref 8.4–10.5)
Creatinine, Ser: 11.78 mg/dL — ABNORMAL HIGH (ref 0.50–1.35)
GFR calc Af Amer: 5 mL/min — ABNORMAL LOW (ref >90)

## 2012-11-13 LAB — INFLUENZA PANEL BY PCR (TYPE A & B)
Influenza A By PCR: NEGATIVE
Influenza B By PCR: NEGATIVE

## 2012-11-13 LAB — PHOSPHORUS: Phosphorus: 5.5 mg/dL — ABNORMAL HIGH (ref 2.3–4.6)

## 2012-11-13 MED ORDER — ALTEPLASE 2 MG IJ SOLR
2.0000 mg | Freq: Once | INTRAMUSCULAR | Status: AC | PRN
  Filled 2012-11-13: qty 2

## 2012-11-13 MED ORDER — HEPARIN SODIUM (PORCINE) 1000 UNIT/ML DIALYSIS
2000.0000 [IU] | INTRAMUSCULAR | Status: DC | PRN
  Administered 2012-11-15: 2000 [IU] via INTRAVENOUS_CENTRAL
  Filled 2012-11-13: qty 2

## 2012-11-13 MED ORDER — PENTAFLUOROPROP-TETRAFLUOROETH EX AERO: 1.0000 "application " | INHALATION_SPRAY | CUTANEOUS | Status: DC | PRN

## 2012-11-13 MED ORDER — HEPARIN SODIUM (PORCINE) 1000 UNIT/ML DIALYSIS
1000.0000 [IU] | INTRAMUSCULAR | Status: DC | PRN
  Filled 2012-11-13: qty 1

## 2012-11-13 MED ORDER — SODIUM CHLORIDE 0.9 % IV SOLN: 100.0000 mL | INTRAVENOUS | Status: DC | PRN

## 2012-11-13 MED ORDER — NEPRO/CARBSTEADY PO LIQD
237.0000 mL | ORAL | Status: DC | PRN
  Filled 2012-11-13: qty 237

## 2012-11-13 MED ORDER — CITALOPRAM HYDROBROMIDE 20 MG PO TABS
20.0000 mg | ORAL_TABLET | Freq: Every day | ORAL | Status: DC
  Administered 2012-11-13 – 2012-11-15 (×4): 20 mg via ORAL
  Filled 2012-11-13 (×5): qty 1

## 2012-11-13 MED ORDER — SIMVASTATIN 10 MG PO TABS
10.0000 mg | ORAL_TABLET | Freq: Every day | ORAL | Status: DC
  Administered 2012-11-13 – 2012-11-15 (×4): 10 mg via ORAL
  Filled 2012-11-13 (×5): qty 1

## 2012-11-13 MED ORDER — ACETAMINOPHEN 325 MG PO TABS
650.0000 mg | ORAL_TABLET | Freq: Four times a day (QID) | ORAL | Status: DC | PRN
  Administered 2012-11-13: 650 mg via ORAL
  Filled 2012-11-13: qty 2

## 2012-11-13 MED ORDER — LIDOCAINE HCL (PF) 1 % IJ SOLN: 5.0000 mL | INTRAMUSCULAR | Status: DC | PRN

## 2012-11-13 MED ORDER — ASPIRIN 81 MG PO CHEW
CHEWABLE_TABLET | ORAL | Status: AC
  Administered 2012-11-13: 19:00:00
  Filled 2012-11-13: qty 1

## 2012-11-13 MED ORDER — LIDOCAINE-PRILOCAINE 2.5-2.5 % EX CREA
1.0000 "application " | TOPICAL_CREAM | CUTANEOUS | Status: DC | PRN
  Filled 2012-11-13: qty 5

## 2012-11-13 NOTE — Progress Notes (Signed)
Patient ID: [name]  male  AVW:[id number]    DOB: 20-Oct-1955    DOA: 11/12/2012  PCP: Dina Rich, MD  Assessment/Plan: Principal Problem:   Fever: On admission, Unclear etiology, chest x-ray clear, patient states he did have on-and-off cough, remote history of staph bacteremia. Flu negative. No GI symptoms - Continue empiric antibiotics, blood cultures pending so far  Active Problems:   ESRD on hemodialysis: Renal service consulted, hemodialysis today, MWF    Aortic valve disease/history of aortic stenosis    Renal transplant failure and rejection: Currently on hemodialysis    Multiple sclerosis  Hypertension: Stable  DVT Prophylaxis:  Code Status:  Disposition: Hopefully DC home tomorrow if blood cultures remain negative and okay with renal service    Subjective: Denies any specific meds at this time, sitting up in the chair awaiting hemodialysis  Objective: Weight change:   Intake/Output Summary (Last 24 hours) at 11/13/12 1546 Last data filed at 11/13/12 0538  Gross per 24 hour  Intake    120 ml  Output      0 ml  Net    120 ml   Blood pressure 138/59, pulse 66, temperature 98.1 F (36.7 C), temperature source Oral, resp. rate 16, height 5\' 10"  (1.778 m), weight 82.9 kg (182 lb 12.2 oz), SpO2 92.00%.  Physical Exam: General: Alert and awake, oriented x3, not in any acute distress. CVS: S1-S2 clear, no murmur rubs or gallops Chest: clear to auscultation bilaterally, no wheezing, rales or rhonchi Abdomen: soft nontender, nondistended, normal bowel sounds, no organomegaly Extremities: no cyanosis, clubbing or edema noted bilaterally, left arm access appears stable Neuro: Cranial nerves II-XII intact, no focal neurological deficits  Lab Results: Basic Metabolic Panel:  Recent Labs Lab 11/12/12 1646 11/13/12 1120 11/13/12 1123  NA 137 135  --   K 5.0 5.9*  --   CL 93* 93*  --   CO2 28 21  --   GLUCOSE 83 95  --   BUN 65* 82*  --   CREATININE 10.56*  11.78*  --   CALCIUM 9.1 8.8  --   PHOS  --   --  5.5*   Liver Function Tests:  Recent Labs Lab 11/12/12 1646  AST 21  ALT 16  ALKPHOS 60  BILITOT 0.3  PROT 7.6  ALBUMIN 3.1*   No results found for this basename: LIPASE, AMYLASE,  in the last 168 hours No results found for this basename: AMMONIA,  in the last 168 hours CBC:  Recent Labs Lab 11/12/12 1646 11/13/12 0752  WBC 9.6 9.3  NEUTROABS 8.5*  --   HGB 12.2* 10.8*  HCT 36.5* 31.9*  MCV 99.7 97.9  PLT 151 137*   Studies/Results: Dg Chest 2 View  11/12/2012  *RADIOLOGY REPORT*  Clinical Data: Fever, cough, congestion, hypertension, atrial fibrillation, end-stage renal disease on dialysis  CHEST - 2 VIEW  Comparison: 02/12/2012  Findings: Enlargement of cardiac silhouette with pulmonary vascular congestion. Calcified tortuous aorta. Lungs clear. No pleural effusion or pneumothorax. Bones unremarkable.  IMPRESSION: Enlargement of cardiac silhouette with pulmonary vascular congestion. No definite acute infiltrate.   Original Report Authenticated By: Ulyses Southward, M.D.     Medications: Scheduled Meds: . aspirin EC  81 mg Oral Daily  . calcium acetate  2,001 mg Oral TID WC  . citalopram  20 mg Oral QHS  . docusate sodium  400 mg Oral BID  . folic acid  1 mg Oral Daily  . lanthanum  2,000 mg Oral TID  WC  . [START ON 11/14/2012] levofloxacin (LEVAQUIN) IV  500 mg Intravenous Q48H  . metoprolol tartrate  25 mg Oral Custom   And  . [START ON 11/14/2012] metoprolol tartrate  50 mg Oral Custom  . simvastatin  10 mg Oral QHS  . sodium chloride  3 mL Intravenous Q12H      LOS: 1 day   RAI,RIPUDEEP M.D. [facility] 11/13/2012, 3:46 PM Pager: [phone]  If 7PM-7AM, please contact night-coverage [email or web address] Password [email or web address]

## 2012-11-13 NOTE — Progress Notes (Signed)
Message sent x 2 to R. Reidler MD via amion to notify of am temp.  Pts wife advised pt had drank cold water just prior to 1st temp taken of 101.2, temp rechecked 20 mins. after water intake and was 102.4.  2nd message sent notified MD of temp recheck.  No orders r'cvd.

## 2012-11-13 NOTE — Consult Note (Signed)
[name] 11/13/2012 SCHERTZ,ROBERT D Requesting Physician:  Dr Isidoro Donning  Reason for Consult:  ESRD pt with fevers HPI: The patient is a 57 y.o. year-old with ESRD on HD, MS, aortic stenosis and afib, hx failed renal transplant presenting with fevers of 48 hrs duration. Had "cold" symptoms for 2 wks, cough and congestion, but fevers just started this past weekend.  Hx of remote "staph infection in the blood", treated at [facility], per pt's wife. No other hx unusual infections. No recent access problems. CXR neg in ED. No prod cough, CP or abd pain. +mild diarrhea in hospital today. No HA, visual change or other neurological complaints.   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  He had a renal Tx from 2008 to 2011.  Gets HD in [place], [place], on MWF schedule.  He has been off of his transplant medications for over a year.  Has hx of aortic stenosis f/b cardiologist at [facility].  Is supposed to have AVR and 2vessel CABG when valve eventually fails, in a couple of years was their estimate acc to wife.    ROS  no joint pain or swelling  no skin rash  no gland swelling   Past Medical History:  Past Medical History  Diagnosis Date  . Renal failure   . Multiple sclerosis   . CAD (coronary artery disease)   . Aortic heart valve prolapse   . Peripheral vascular disease   . Sleep apnea     ordered bipap with 3L bled in, but pt will not wear  . Atrial fibrillation   . Multiple sclerosis   . Renal transplant failure and rejection 02/14/2012    Past Surgical History:  Past Surgical History  Procedure Laterality Date  . Av fistula placement    . Flash      flash pulmonary edema  . Kidney transplant  08/2007  . Hernia repair  07/2011  . Excised squamous cells at rectum  2006    Family History: History reviewed. No pertinent family history. Social History:  reports that he quit smoking about 14 years ago. His smoking use included Cigarettes. He has a 40 pack-year smoking history. He  does not have any smokeless tobacco history on file. He reports that he does not drink alcohol or use illicit drugs.  Allergies:  Allergies  Allergen Reactions  . Penicillins Other (See Comments)    migraine  . Adhesive (Tape) Rash    Please use paper tape    Home medications: Prior to Admission medications   Medication Sig Start Date End Date Taking? Authorizing Kriston Pasquarello  acetaminophen (TYLENOL) 500 MG tablet Take 1,000 mg by mouth every 6 (six) hours as needed for pain or fever.   Yes Historical Meela Wareing, MD  aspirin EC 81 MG tablet Take 81 mg by mouth daily.   Yes Historical Jerrick Farve, MD  b complex-vitamin c-folic acid (NEPHRO-VITE) 0.8 MG TABS Take 0.8 mg by mouth daily.    Yes Historical Hydie Langan, MD  calcium acetate (PHOSLO) 667 MG capsule Take 2,001 mg by mouth 3 (three) times daily with meals.   Yes Historical Shraga Custard, MD  citalopram (CELEXA) 20 MG tablet Take 20 mg by mouth daily.   Yes Historical Shalamar Crays, MD  docusate sodium (COLACE) 100 MG capsule Take 400 mg by mouth 2 (two) times daily.   Yes Historical Fabiha Rougeau, MD  folic acid (FOLVITE) 1 MG tablet Take 1 mg by mouth daily.   Yes Historical Juandaniel Manfredo, MD  gabapentin (NEURONTIN)  300 MG capsule Take 300 mg by mouth every hemodialysis. Take on Monday, Wednesday, and Friday   Yes Historical Charmelle Soh, MD  lanthanum (FOSRENOL) 1000 MG chewable tablet Chew 2,000 mg by mouth 3 (three) times daily. Takes with meals and snacks   Yes Historical Raylei Losurdo, MD  metoprolol (LOPRESSOR) 50 MG tablet Take 50 mg by mouth 2 (two) times daily. 25mg  twice daily on dialysis days (M,W,F) Non-diaylsis days: 50mg  in the morning 25mg  in the evening   Yes Historical France Noyce, MD  omeprazole (PRILOSEC OTC) 20 MG tablet Take 20 mg by mouth 2 (two) times daily.    Yes Historical Abdulrahman Bracey, MD  Probiotic Product (PROBIOTIC PO) Take 1 capsule by mouth 2 (two) times daily.   Yes Historical Seville Brick, MD  simvastatin (ZOCOR) 20 MG tablet Take 10 mg by mouth  daily.   Yes Historical Wilmot Quevedo, MD    Labs: Basic Metabolic Panel:  Recent Labs Lab 11/12/12 1646  NA 137  K 5.0  CL 93*  CO2 28  GLUCOSE 83  BUN 65*  CREATININE 10.56*  CALCIUM 9.1   Liver Function Tests:  Recent Labs Lab 11/12/12 1646  AST 21  ALT 16  ALKPHOS 60  BILITOT 0.3  PROT 7.6  ALBUMIN 3.1*   No results found for this basename: LIPASE, AMYLASE,  in the last 168 hours No results found for this basename: AMMONIA,  in the last 168 hours CBC:  Recent Labs Lab 11/12/12 1646 11/13/12 0752  WBC 9.6 9.3  NEUTROABS 8.5*  --   HGB 12.2* 10.8*  HCT 36.5* 31.9*  MCV 99.7 97.9  PLT 151 137*   PT/INR: @LABRCNTIP (inr:5) Cardiac Enzymes: )No results found for this basename: CKTOTAL, CKMB, CKMBINDEX, TROPONINI,  in the last 168 hours CBG: No results found for this basename: GLUCAP,  in the last 168 hours  Physical Exam:  Blood pressure 148/46, pulse 76, temperature 99.8 F (37.7 C), temperature source Oral, resp. rate 18, height 5\' 10"  (1.778 m), weight 81.7 kg (180 lb 1.9 oz), SpO2 92.00%.  Gen: alert, no distress HEENT:  EOMI, sclera anicteric, throat clear Neck: no JVD, no LAN Chest: clear bilat, no rales or rhonchi CV: regular, no rub or gallop, 2/6 SEM, pedal pulses intact Abdomen: soft, nontender, no HSM or ascites, no mass Ext: no LE edema, no joint effusion or deformity, no gangrene or ulceration Neuro: alert, Ox3, no focal deficit Access: L arm access patent  Outpatient HD: [place], MWF    Impression/Plan 1. Fevers- unclear etiology, clear CXR, no specific symptoms. Had remote staph "blood infection" per wife managed at [facility] I believe. Has known underlying aortc valve disease (AS).  Nothing on exam to suggest infectious source, AVF looks OK. On empiric abx and cx's pending. 2. ESRD (membranous GN, started HD 2000)- cont mwf HD 3. HTN/volume- no vol excess 4. Anemia of CKD- get EPO dose from OP records 5. Secondary HPTH- cont  Fosrenol 6. Multiple sclerosis 7. Aortic stenosis 8. Hx failed renal transplant   Vinson Moselle  MD [facility] [phone] pgr    [phone] cell 11/13/2012, 10:57 AM

## 2012-11-14 DIAGNOSIS — R55 Syncope and collapse: Secondary | ICD-10-CM

## 2012-11-14 DIAGNOSIS — I959 Hypotension, unspecified: Secondary | ICD-10-CM

## 2012-11-14 DIAGNOSIS — A0472 Enterocolitis due to Clostridium difficile, not specified as recurrent: Secondary | ICD-10-CM

## 2012-11-14 DIAGNOSIS — R197 Diarrhea, unspecified: Secondary | ICD-10-CM

## 2012-11-14 DIAGNOSIS — R7881 Bacteremia: Principal | ICD-10-CM | POA: Diagnosis present

## 2012-11-14 DIAGNOSIS — D696 Thrombocytopenia, unspecified: Secondary | ICD-10-CM

## 2012-11-14 LAB — EXPECTORATED SPUTUM ASSESSMENT W GRAM STAIN, RFLX TO RESP C

## 2012-11-14 MED ORDER — VANCOMYCIN HCL 10 G IV SOLR
1750.0000 mg | Freq: Once | INTRAVENOUS | Status: AC
  Administered 2012-11-14: 1750 mg via INTRAVENOUS
  Filled 2012-11-14: qty 1750

## 2012-11-14 MED ORDER — DIPHENHYDRAMINE HCL 25 MG PO CAPS
25.0000 mg | ORAL_CAPSULE | Freq: Once | ORAL | Status: AC
  Administered 2012-11-14: 25 mg via ORAL
  Filled 2012-11-14: qty 1

## 2012-11-14 MED ORDER — SODIUM CHLORIDE 0.9 % IV SOLN
750.0000 mg | INTRAVENOUS | Status: DC
  Administered 2012-11-15: 750 mg via INTRAVENOUS
  Filled 2012-11-14: qty 750

## 2012-11-14 NOTE — Progress Notes (Signed)
Subjective: Blood cultures are + for GPC in chains  Objective Vital signs in last 24 hours: Filed Vitals:   11/14/12 0136 11/14/12 0524 11/14/12 0638 11/14/12 1147  BP:  97/32 112/45 107/38  Pulse:  75  84  Temp: 97.9 F (36.6 C) 98.1 F (36.7 C)    TempSrc: Oral Oral    Resp:  16    Height:      Weight:      SpO2:  100%     Weight change: 1.2 kg (2 lb 10.3 oz)  Intake/Output Summary (Last 24 hours) at 11/14/12 1637 Last data filed at 11/14/12 0830  Gross per 24 hour  Intake     60 ml  Output   3500 ml  Net  -3440 ml   Labs: Basic Metabolic Panel:  Recent Labs Lab 11/12/12 1646 11/13/12 1120 11/13/12 1123  NA 137 135  --   K 5.0 5.9*  --   CL 93* 93*  --   CO2 28 21  --   GLUCOSE 83 95  --   BUN 65* 82*  --   CREATININE 10.56* 11.78*  --   CALCIUM 9.1 8.8  --   PHOS  --   --  5.5*   Liver Function Tests:  Recent Labs Lab 11/12/12 1646  AST 21  ALT 16  ALKPHOS 60  BILITOT 0.3  PROT 7.6  ALBUMIN 3.1*   No results found for this basename: LIPASE, AMYLASE,  in the last 168 hours No results found for this basename: AMMONIA,  in the last 168 hours CBC:  Recent Labs Lab 11/12/12 1646 11/13/12 0752  WBC 9.6 9.3  NEUTROABS 8.5*  --   HGB 12.2* 10.8*  HCT 36.5* 31.9*  MCV 99.7 97.9  PLT 151 137*   PT/INR: @LABRCNTIP (inr:5)   Scheduled Meds ) . aspirin EC  81 mg Oral Daily  . calcium acetate  2,001 mg Oral TID WC  . citalopram  20 mg Oral QHS  . docusate sodium  400 mg Oral BID  . folic acid  1 mg Oral Daily  . lanthanum  2,000 mg Oral TID WC  . levofloxacin (LEVAQUIN) IV  500 mg Intravenous Q48H  . metoprolol tartrate  25 mg Oral Custom   And  . metoprolol tartrate  50 mg Oral Custom  . simvastatin  10 mg Oral QHS  . sodium chloride  3 mL Intravenous Q12H  . [START ON 11/15/2012] vancomycin  750 mg Intravenous Q M,W,F-HD    Physical Exam:  Blood pressure 107/38, pulse 84, temperature 98.1 F (36.7 C), temperature source Oral, resp.  rate 16, height 5\' 10"  (1.778 m), weight 78.4 kg (172 lb 13.5 oz), SpO2 100.00%.  Gen: alert, no distress  HEENT: EOMI, sclera anicteric, throat clear  Neck: no JVD, no LAN  Chest: clear bilat, no rales or rhonchi  CV: regular, no rub or gallop, 2/6 SEM, pedal pulses intact  Abdomen: soft, nontender, no HSM or ascites, no mass  Ext: no LE edema, no joint effusion or deformity, no gangrene or ulceration  Neuro: alert, Ox3, no focal deficit  Access: L arm access patent   Outpatient HD: [place], MWF   Impression/Plan  1. Fevers / gram positive bacteremia- empiric abx, awaiting ID/sensitivities. No obvious source on exam 2. ESRD- cont mwf HD 3. HTN/volume- no vol excess 4. Anemia of CKD- get EPO dose from OP records 5. Secondary HPTH- cont Fosrenol 6. Multiple sclerosis 7. Aortic stenosis 8. Hx failed renal  transplant   Vinson Moselle  MD [phone] pgr    [phone] cell 11/14/2012, 4:37 PM

## 2012-11-14 NOTE — Progress Notes (Signed)
Patient ID: [name]  male  ZOX:[id number]    DOB: Nov 05, 1955    DOA: 11/12/2012  PCP: Dina Rich, MD  Assessment/Plan: Principal Problem:   Fevers/ GPC BACTEREMIA: Unclear etiology, chest x-ray clear, patient states he did have on-and-off cough, remote history of staph bacteremia. Flu negative.  - Placed on vancomycin, reordered 2 sets of blood cultures - Patient also started having diarrhea, ordered stool cultures, C. Difficile PCR - Sputum culture - Patient's wife states that he does not make enough urine to the urine sample. Will follow blood cultures for sensitivities - Discussed with nephrology, Dr. Arlean Hopping  Active Problems:   ESRD on hemodialysis: Renal service consulted, hemodialysis, MWF    Aortic valve disease/history of aortic stenosis    Renal transplant failure and rejection:  on hemodialysis    Multiple sclerosis  Hypertension: Stable  DVT Prophylaxis:  Code Status: Full  Disposition:     Subjective: Denies any specific complaints, sitting up in the chair, wife at the bedside, no fevers overnight. Stated that he had  itching with the vancomycin but improved with Benadryl  Objective: Weight change: 1.2 kg (2 lb 10.3 oz)  Intake/Output Summary (Last 24 hours) at 11/14/12 1249 Last data filed at 11/14/12 0830  Gross per 24 hour  Intake     60 ml  Output   3500 ml  Net  -3440 ml   Blood pressure 107/38, pulse 84, temperature 98.1 F (36.7 C), temperature source Oral, resp. rate 16, height 5\' 10"  (1.778 m), weight 78.4 kg (172 lb 13.5 oz), SpO2 100.00%.  Physical Exam: General: Alert and awake, oriented x3, not in any acute distress. CVS: S1-S2 clear, no murmur rubs or gallops Chest: clear to auscultation bilaterally, no wheezing, rales or rhonchi Abdomen: soft nontender, nondistended, normal bowel sounds, no organomegaly Extremities: no cyanosis, clubbing or edema noted bilaterally, left arm access appears stable  No rashes  Lab Results: Basic  Metabolic Panel:  Recent Labs Lab 11/12/12 1646 11/13/12 1120 11/13/12 1123  NA 137 135  --   K 5.0 5.9*  --   CL 93* 93*  --   CO2 28 21  --   GLUCOSE 83 95  --   BUN 65* 82*  --   CREATININE 10.56* 11.78*  --   CALCIUM 9.1 8.8  --   PHOS  --   --  5.5*   Liver Function Tests:  Recent Labs Lab 11/12/12 1646  AST 21  ALT 16  ALKPHOS 60  BILITOT 0.3  PROT 7.6  ALBUMIN 3.1*   No results found for this basename: LIPASE, AMYLASE,  in the last 168 hours No results found for this basename: AMMONIA,  in the last 168 hours CBC:  Recent Labs Lab 11/12/12 1646 11/13/12 0752  WBC 9.6 9.3  NEUTROABS 8.5*  --   HGB 12.2* 10.8*  HCT 36.5* 31.9*  MCV 99.7 97.9  PLT 151 137*   Studies/Results: Dg Chest 2 View  11/12/2012  *RADIOLOGY REPORT*  Clinical Data: Fever, cough, congestion, hypertension, atrial fibrillation, end-stage renal disease on dialysis  CHEST - 2 VIEW  Comparison: 02/12/2012  Findings: Enlargement of cardiac silhouette with pulmonary vascular congestion. Calcified tortuous aorta. Lungs clear. No pleural effusion or pneumothorax. Bones unremarkable.  IMPRESSION: Enlargement of cardiac silhouette with pulmonary vascular congestion. No definite acute infiltrate.   Original Report Authenticated By: Ulyses Southward, M.D.     Medications: Scheduled Meds: . aspirin EC  81 mg Oral Daily  . calcium acetate  2,001  mg Oral TID WC  . citalopram  20 mg Oral QHS  . docusate sodium  400 mg Oral BID  . folic acid  1 mg Oral Daily  . lanthanum  2,000 mg Oral TID WC  . levofloxacin (LEVAQUIN) IV  500 mg Intravenous Q48H  . metoprolol tartrate  25 mg Oral Custom   And  . metoprolol tartrate  50 mg Oral Custom  . simvastatin  10 mg Oral QHS  . sodium chloride  3 mL Intravenous Q12H  . [START ON 11/15/2012] vancomycin  750 mg Intravenous Q M,W,F-HD      LOS: 2 days   RAI,RIPUDEEP M.D. [facility] 11/14/2012, 12:49 PM Pager: [phone]  If 7PM-7AM, please  contact night-coverage [email or web address] Password [email or web address]

## 2012-11-14 NOTE — Progress Notes (Signed)
ANTIBIOTIC CONSULT NOTE - INITIAL  Pharmacy Consult for vancomycin  Indication: R/o bacteremia  Allergies  Allergen Reactions  . Penicillins Other (See Comments)    migraine  . Adhesive (Tape) Rash    Please use paper tape    Patient Measurements: Height: 5\' 10"  (177.8 cm) Weight: 172 lb 13.5 oz (78.4 kg) IBW/kg (Calculated) : 73  Vital Signs: Temp: 97.9 F (36.6 C) (03/11 0136) Temp src: Oral (03/11 0136) BP: 153/46 mmHg (03/10 2008) Pulse Rate: 102 (03/10 2008) Intake/Output from previous day: 03/10 0701 - 03/11 0700 In: -  Out: 3500  Intake/Output from this shift:    Labs:  Recent Labs  11/12/12 1646 11/13/12 0752 11/13/12 1120  WBC 9.6 9.3  --   HGB 12.2* 10.8*  --   PLT 151 137*  --   CREATININE 10.56*  --  11.78*   Estimated Creatinine Clearance: 7.1 ml/min (by C-G formula based on Cr of 11.78). No results found for this basename: VANCOTROUGH, VANCOPEAK, VANCORANDOM, GENTTROUGH, GENTPEAK, GENTRANDOM, TOBRATROUGH, TOBRAPEAK, TOBRARND, AMIKACINPEAK, AMIKACINTROU, AMIKACIN,  in the last 72 hours   Microbiology: Recent Results (from the past 720 hour(s))  CULTURE, BLOOD (ROUTINE X 2)     Status: None   Collection Time    11/12/12  8:22 PM      Result Value Range Status   Specimen Description BLOOD RIGHT ARM   Final   Special Requests BOTTLES DRAWN AEROBIC AND ANAEROBIC 5CC EA   Final   Culture  Setup Time 11/13/2012 03:23   Final   Culture     Final   Value: GRAM POSITIVE COCCI IN CHAINS     Note: Gram Stain Report Called to,Read Back By and Verified With: INDILA DULIAO ON 11/14/2012 AT 1:17A BY WILEJ   Report Status PENDING   Incomplete  CULTURE, BLOOD (ROUTINE X 2)     Status: None   Collection Time    11/12/12  8:30 PM      Result Value Range Status   Specimen Description BLOOD RIGHT HAND   Final   Special Requests BOTTLES DRAWN AEROBIC ONLY 10CC   Final   Culture  Setup Time 11/13/2012 03:23   Final   Culture     Final   Value: GRAM POSITIVE  COCCI IN CHAINS     Note: Gram Stain Report Called to,Read Back By and Verified With: CARLA DULIAO @0345  ON 11/14/12 BY MCLET   Report Status PENDING   Incomplete    Medical History: Past Medical History  Diagnosis Date  . Multiple sclerosis   . CAD (coronary artery disease)   . Aortic heart valve prolapse   . Peripheral vascular disease   . Sleep apnea     ordered bipap with 3L bled in, but pt will not wear  . Atrial fibrillation   . Multiple sclerosis   . 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  He had a renal Tx from 2008 to 2011.  Gets HD in [place], [place] on MWF schedule.      Medications:  Scheduled:  . [COMPLETED] aspirin      . aspirin EC  81 mg Oral Daily  . calcium acetate  2,001 mg Oral TID WC  . citalopram  20 mg Oral QHS  . docusate sodium  400 mg Oral BID  . folic acid  1 mg Oral  Daily  . lanthanum  2,000 mg Oral TID WC  . levofloxacin (LEVAQUIN) IV  500 mg Intravenous Q48H  . metoprolol tartrate  25 mg Oral Custom   And  . metoprolol tartrate  50 mg Oral Custom  . simvastatin  10 mg Oral QHS  . sodium chloride  3 mL Intravenous Q12H   Assessment: 57 yo male with ESRD-HD presented with fevers. Now with blood cultures with gram positive cocci in chains (2/2). Per nephrology note, patient with remote history of "staph infection in the blood." Pharmacy to manage IV vancomycin.   Goal of Therapy:  Pre-HD vancomycin level 15-25 mcg/mL  Plan:  1. Vancomycin 1750mg  IV x 1.  2. Vancomycin 750mg  IV Q-HD MWF  Emeline Gins 11/14/2012,4:04 AM

## 2012-11-15 DIAGNOSIS — R7881 Bacteremia: Secondary | ICD-10-CM

## 2012-11-15 DIAGNOSIS — B9689 Other specified bacterial agents as the cause of diseases classified elsewhere: Secondary | ICD-10-CM

## 2012-11-15 LAB — RENAL FUNCTION PANEL
CO2: 22 mEq/L (ref 19–32)
Chloride: 98 mEq/L (ref 96–112)
GFR calc Af Amer: 6 mL/min — ABNORMAL LOW (ref >90)
Glucose, Bld: 145 mg/dL — ABNORMAL HIGH (ref 70–99)
Phosphorus: 4.3 mg/dL (ref 2.3–4.6)
Potassium: 4.6 mEq/L (ref 3.5–5.1)
Sodium: 138 mEq/L (ref 135–145)

## 2012-11-15 LAB — CBC
Hemoglobin: 11.1 g/dL — ABNORMAL LOW (ref 13.0–17.0)
RBC: 3.41 MIL/uL — ABNORMAL LOW (ref 4.22–5.81)
WBC: 6.3 10*3/uL (ref 4.0–10.5)

## 2012-11-15 LAB — FECAL LACTOFERRIN, QUANT

## 2012-11-15 MED ORDER — SODIUM CHLORIDE 0.9 % IV SOLN
62.5000 mg | INTRAVENOUS | Status: DC
  Administered 2012-11-15: 62.5 mg via INTRAVENOUS
  Filled 2012-11-15: qty 5

## 2012-11-15 MED ORDER — SODIUM CHLORIDE 0.9 % IV SOLN: 100.0000 mL | INTRAVENOUS | Status: DC | PRN

## 2012-11-15 MED ORDER — DARBEPOETIN ALFA-POLYSORBATE 60 MCG/0.3ML IJ SOLN: 60.0000 ug | INTRAMUSCULAR | Status: DC

## 2012-11-15 MED ORDER — LIDOCAINE-PRILOCAINE 2.5-2.5 % EX CREA: 1.0000 "application " | TOPICAL_CREAM | CUTANEOUS | Status: DC | PRN

## 2012-11-15 MED ORDER — DARBEPOETIN ALFA-POLYSORBATE 60 MCG/0.3ML IJ SOLN
60.0000 ug | INTRAMUSCULAR | Status: DC
  Administered 2012-11-15: 60 ug via INTRAVENOUS

## 2012-11-15 MED ORDER — PENTAFLUOROPROP-TETRAFLUOROETH EX AERO: 1.0000 "application " | INHALATION_SPRAY | CUTANEOUS | Status: DC | PRN

## 2012-11-15 MED ORDER — NEPRO/CARBSTEADY PO LIQD
237.0000 mL | ORAL | Status: DC | PRN
  Filled 2012-11-15: qty 237

## 2012-11-15 MED ORDER — ALTEPLASE 2 MG IJ SOLR
2.0000 mg | Freq: Once | INTRAMUSCULAR | Status: DC | PRN
  Filled 2012-11-15: qty 2

## 2012-11-15 MED ORDER — LIDOCAINE HCL (PF) 1 % IJ SOLN: 5.0000 mL | INTRAMUSCULAR | Status: DC | PRN

## 2012-11-15 MED ORDER — HEPARIN SODIUM (PORCINE) 1000 UNIT/ML DIALYSIS
2000.0000 [IU] | Freq: Once | INTRAMUSCULAR | Status: DC
  Filled 2012-11-15: qty 2

## 2012-11-15 MED ORDER — HEPARIN SODIUM (PORCINE) 1000 UNIT/ML DIALYSIS: 1000.0000 [IU] | INTRAMUSCULAR | Status: DC | PRN

## 2012-11-15 NOTE — Progress Notes (Deleted)
Subjective:  On hd , no cos, no further loose stools Objective Vital signs in last 24 hours: Filed Vitals:   11/14/12 2150 11/15/12 0521 11/15/12 0700 11/15/12 0756  BP: 112/30 116/37 118/54 112/52  Pulse: 68 65 66 69  Temp: 98.1 F (36.7 C) 98 F (36.7 C) 97.9 F (36.6 C)   TempSrc: Oral Oral Oral   Resp: 18 18 16 18   Height:      Weight:   79.2 kg (174 lb 9.7 oz)   SpO2: 98% 98% 98%    Weight change: -3.7 kg (-8 lb 2.5 oz)  Intake/Output Summary (Last 24 hours) at 11/15/12 0821 Last data filed at 11/14/12 1630  Gross per 24 hour  Intake    300 ml  Output      0 ml  Net    300 ml   Labs: Basic Metabolic Panel:  Recent Labs Lab 11/12/12 1646 11/13/12 1120 11/13/12 1123  NA 137 135  --   K 5.0 5.9*  --   CL 93* 93*  --   CO2 28 21  --   GLUCOSE 83 95  --   BUN 65* 82*  --   CREATININE 10.56* 11.78*  --   CALCIUM 9.1 8.8  --   PHOS  --   --  5.5*   Liver Function Tests:  Recent Labs Lab 11/12/12 1646  AST 21  ALT 16  ALKPHOS 60  BILITOT 0.3  PROT 7.6  ALBUMIN 3.1*   No results found for this basename: LIPASE, AMYLASE,  in the last 168 hours No results found for this basename: AMMONIA,  in the last 168 hours CBC:  Recent Labs Lab 11/12/12 1646 11/13/12 0752  WBC 9.6 9.3  NEUTROABS 8.5*  --   HGB 12.2* 10.8*  HCT 36.5* 31.9*  MCV 99.7 97.9  PLT 151 137*   Cardiac Enzymes: No results found for this basename: CKTOTAL, CKMB, CKMBINDEX, TROPONINI,  in the last 168 hours CBG: No results found for this basename: GLUCAP,  in the last 168 hours  Iron Studies: No results found for this basename: IRON, TIBC, TRANSFERRIN, FERRITIN,  in the last 72 hours Studies/Results: No results found. Medications:   . aspirin EC  81 mg Oral Daily  . calcium acetate  2,001 mg Oral TID WC  . citalopram  20 mg Oral QHS  . docusate sodium  400 mg Oral BID  . folic acid  1 mg Oral Daily  . [START ON 11/16/2012] heparin  2,000 Units Dialysis Once in dialysis  .  lanthanum  2,000 mg Oral TID WC  . levofloxacin (LEVAQUIN) IV  500 mg Intravenous Q48H  . metoprolol tartrate  25 mg Oral Custom   And  . metoprolol tartrate  50 mg Oral Custom  . simvastatin  10 mg Oral QHS  . sodium chloride  3 mL Intravenous Q12H  . vancomycin  750 mg Intravenous Q M,W,F-HD   I  have reviewed scheduled and prn medications.  Physical Exam: General: Alert, NAD, Appropriate Heart: RRR, 1/6 sem  apex Lungs: CTA Abdomen:  Soft, nontender Extremities: Dialysis Access: No pedal edema, L FA AVF patent on hd   Outpatient HD: [place], MWF , EDW= 79 kg, Hrs= 4, bfr 400, bfr a1.5 ,  [place]= 1.0 K, CA 2.5  LLA AVF, EPO =0, Hectorol=0/ Heparin= 8,000units  Problem/Plan: 1. Fevers / gram positive bacteremia- on  empiric Vancomycin ,AND  SENSITIVITIES STILL PENDING , ?? source 2. ESRD- MWF HD [facility] and in hosp. 3. HTN/volume- no vol excess now, will be below edw at dc / low dose Metoprolol  4. Anemia of CKD- hgb 10.8<12.2 on no out pt. Epo, restart 60 mcg Aranesp today hd and on Weekly Venofer 50mg  5. Secondary HPTH-  On Fosrenol and Phoslo in hospital, 5.5 phos 3/10  Pending today , does not like Fosrenol, will dc in hospital 6. Multiple sclerosis 7. Aortic stenosis 8. Hx failed renal transplant= off meds  Lenny Pastel, PA-C [facility] [phone] 11/15/2012,8:21 AM  LOS: 3 days

## 2012-11-15 NOTE — Consult Note (Signed)
[facility]     Reason for Consult: positive blood cultures    Referring Physician: Dr. Rosie Fate  Principal Problem:   GPC Bacteremia Active Problems:   ESRD on hemodialysis   Fever   Aortic valve disease   Renal transplant failure and rejection   Multiple sclerosis   . aspirin EC  81 mg Oral Daily  . calcium acetate  2,001 mg Oral TID WC  . citalopram  20 mg Oral QHS  . darbepoetin (ARANESP) injection - DIALYSIS  60 mcg Intravenous Q Wed-HD  . docusate sodium  400 mg Oral BID  . ferric gluconate (FERRLECIT/NULECIT) IV  62.5 mg Intravenous Weekly  . folic acid  1 mg Oral Daily  . levofloxacin (LEVAQUIN) IV  500 mg Intravenous Q48H  . metoprolol tartrate  25 mg Oral Custom   And  . metoprolol tartrate  50 mg Oral Custom  . simvastatin  10 mg Oral QHS  . sodium chloride  3 mL Intravenous Q12H  . vancomycin  750 mg Intravenous Q M,W,F-HD    Recommendations: Vancomycin for a 2 week course I agree that specimen should be sent to a specialty micro lab for ID per Dr. Arlean Hopping request   Assessment: He has GPC and I discussed this with the micro tech and is growing in 2 aerobic bottles and 1 anaerobic but preliminary ID is possible Diphtheroids (though not definitive).  This is a very unusual organism and I would treat it since it has grown in more than one bottle and therefore would not be considered a contaminate.  The patient did improve with empiric therapy and if diphtheroid, vancomycin would be adequate.   He does have negative follow up blood cultures.    Antibiotics: Vancomycin levaquin  HPI: [name] is a 57 y.o. 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  He denies any other symptoms other than mild cough and body aches at the time, but none since.  He now has no fever and feels well.  No cp, no sob, no abdominal pain.     Review of  Systems: Pertinent items are noted in HPI.  Past Medical History  Diagnosis Date  . Multiple sclerosis   . CAD (coronary artery disease)   . Aortic heart valve prolapse   . Peripheral vascular disease   . Sleep apnea     ordered bipap with 3L bled in, but pt will not wear  . Atrial fibrillation   . Multiple sclerosis   . 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  He had a renal Tx from 2008 to 2011.  Gets HD in [place], [place] on MWF schedule.      History  Substance Use Topics  . Smoking status: Former Smoker -- 2.00 packs/day for 20 years    Types: Cigarettes    Quit date: 08/06/1998  . Smokeless tobacco: Not on file  . Alcohol Use: No    History reviewed. No pertinent family history. Allergies  Allergen Reactions  . Penicillins Other (See Comments)    migraine  . Adhesive (Tape) Rash    Please use paper tape    OBJECTIVE: Blood pressure 113/37, pulse 72, temperature 98.3 F (36.8 C), temperature source Oral, resp. rate 18, height 5'  10" (1.778 m), weight 169 lb 15.6 oz (77.1 kg), SpO2 99.00%. General: Awake, alert, oriented x 3, nad Skin: no rashes Lungs: CTA B Cor: RRR without m/r/g Abdomen: soft, ntnd, +bs Ext: left arm with fistula  Microbiology: Recent Results (from the past 240 hour(s))  CULTURE, BLOOD (ROUTINE X 2)     Status: None   Collection Time    11/12/12  8:22 PM      Result Value Range Status   Specimen Description BLOOD RIGHT ARM   Final   Special Requests BOTTLES DRAWN AEROBIC AND ANAEROBIC 5CC EA   Final   Culture  Setup Time 11/13/2012 03:23   Final   Culture     Final   Value: GRAM POSITIVE COCCI IN CHAINS     Note: Gram Stain Report Called to,Read Back By and Verified With: INDILA DULIAO ON 11/14/2012 AT 1:17A BY WILEJ   Report Status PENDING   Incomplete  CULTURE, BLOOD (ROUTINE X 2)     Status: None   Collection Time     11/12/12  8:30 PM      Result Value Range Status   Specimen Description BLOOD RIGHT HAND   Final   Special Requests BOTTLES DRAWN AEROBIC ONLY 10CC   Final   Culture  Setup Time 11/13/2012 03:23   Final   Culture     Final   Value: GRAM POSITIVE COCCI IN CHAINS     Note: Gram Stain Report Called to,Read Back By and Verified With: CARLA DULIAO @0345  ON 11/14/12 BY MCLET   Report Status PENDING   Incomplete  CULTURE, BLOOD (ROUTINE X 2)     Status: None   Collection Time    11/14/12  9:10 AM      Result Value Range Status   Specimen Description BLOOD RIGHT HAND   Final   Special Requests BOTTLES DRAWN AEROBIC AND ANAEROBIC 10CC   Final   Culture  Setup Time 11/14/2012 13:57   Final   Culture     Final   Value:        BLOOD CULTURE RECEIVED NO GROWTH TO DATE CULTURE WILL BE HELD FOR 5 DAYS BEFORE ISSUING A FINAL NEGATIVE REPORT   Report Status PENDING   Incomplete  CULTURE, BLOOD (ROUTINE X 2)     Status: None   Collection Time    11/14/12  9:20 AM      Result Value Range Status   Specimen Description BLOOD RIGHT ARM   Final   Special Requests BOTTLES DRAWN AEROBIC AND ANAEROBIC 10CC   Final   Culture  Setup Time 11/14/2012 13:57   Final   Culture     Final   Value:        BLOOD CULTURE RECEIVED NO GROWTH TO DATE CULTURE WILL BE HELD FOR 5 DAYS BEFORE ISSUING A FINAL NEGATIVE REPORT   Report Status PENDING   Incomplete  CLOSTRIDIUM DIFFICILE BY PCR     Status: None   Collection Time    11/14/12 10:58 AM      Result Value Range Status   C difficile by pcr NEGATIVE  NEGATIVE Final  CULTURE, EXPECTORATED SPUTUM-ASSESSMENT     Status: None   Collection Time    11/14/12  1:44 PM      Result Value Range Status   Specimen Description SPUTUM   Final   Special Requests NONE   Final   Sputum evaluation     Final   Value: THIS SPECIMEN IS ACCEPTABLE. RESPIRATORY  CULTURE REPORT TO FOLLOW.   Report Status 11/14/2012 FINAL   Final  CULTURE, RESPIRATORY (NON-EXPECTORATED)     Status: None    Collection Time    11/14/12  1:44 PM      Result Value Range Status   Specimen Description SPUTUM   Final   Special Requests NONE   Final   Gram Stain     Final   Value: FEW WBC PRESENT,BOTH PMN AND MONONUCLEAR     RARE SQUAMOUS EPITHELIAL CELLS PRESENT     FEW GRAM POSITIVE RODS     FEW GRAM POSITIVE COCCI IN CLUSTERS     IN PAIRS RARE YEAST   Culture PENDING   Incomplete   Report Status PENDING   Incomplete    Staci Righter, MD [facility] [phone] pager  [phone] cell 11/15/2012, 2:16 PM

## 2012-11-15 NOTE — Progress Notes (Deleted)
I was present at this dialysis session. I have reviewed the session itself and made appropriate changes.   Vinson Moselle, MD [employer] 11/15/2012, 9:49 AM

## 2012-11-15 NOTE — Progress Notes (Signed)
Subjective: FEels a lot better, fever curve down  Objective Vital signs in last 24 hours: Filed Vitals:   11/15/12 0930 11/15/12 1000 11/15/12 1030 11/15/12 1100  BP: 115/47 109/47 113/44 115/51  Pulse: 67 65 67 69  Temp:      TempSrc:      Resp: 18 18  16   Height:      Weight:      SpO2:       Weight change: -3.7 kg (-8 lb 2.5 oz)  Intake/Output Summary (Last 24 hours) at 11/15/12 1149 Last data filed at 11/14/12 1630  Gross per 24 hour  Intake    240 ml  Output      0 ml  Net    240 ml   Labs: Basic Metabolic Panel:  Recent Labs Lab 11/12/12 1646 11/13/12 1120 11/13/12 1123 11/15/12 0822  NA 137 135  --  138  K 5.0 5.9*  --  4.6  CL 93* 93*  --  98  CO2 28 21  --  22  GLUCOSE 83 95  --  145*  BUN 65* 82*  --  69*  CREATININE 10.56* 11.78*  --  9.97*  CALCIUM 9.1 8.8  --  8.9  PHOS  --   --  5.5* 4.3   Liver Function Tests:  Recent Labs Lab 11/12/12 1646 11/15/12 0822  AST 21  --   ALT 16  --   ALKPHOS 60  --   BILITOT 0.3  --   PROT 7.6  --   ALBUMIN 3.1* 2.9*   No results found for this basename: LIPASE, AMYLASE,  in the last 168 hours No results found for this basename: AMMONIA,  in the last 168 hours CBC:  Recent Labs Lab 11/12/12 1646 11/13/12 0752 11/15/12 0822  WBC 9.6 9.3 6.3  NEUTROABS 8.5*  --   --   HGB 12.2* 10.8* 11.1*  HCT 36.5* 31.9* 32.3*  MCV 99.7 97.9 94.7  PLT 151 137* 136*   PT/INR: @LABRCNTIP (inr:5)   Scheduled Meds ) . aspirin EC  81 mg Oral Daily  . calcium acetate  2,001 mg Oral TID WC  . citalopram  20 mg Oral QHS  . darbepoetin (ARANESP) injection - DIALYSIS  60 mcg Intravenous Q Wed-HD  . docusate sodium  400 mg Oral BID  . ferric gluconate (FERRLECIT/NULECIT) IV  62.5 mg Intravenous Weekly  . folic acid  1 mg Oral Daily  . [START ON 11/16/2012] heparin  2,000 Units Dialysis Once in dialysis  . levofloxacin (LEVAQUIN) IV  500 mg Intravenous Q48H  . metoprolol tartrate  25 mg Oral Custom   And  .  metoprolol tartrate  50 mg Oral Custom  . simvastatin  10 mg Oral QHS  . sodium chloride  3 mL Intravenous Q12H  . vancomycin  750 mg Intravenous Q M,W,F-HD    Physical Exam:  Blood pressure 115/51, pulse 69, temperature 97.9 F (36.6 C), temperature source Oral, resp. rate 16, height 5\' 10"  (1.778 m), weight 79.2 kg (174 lb 9.7 oz), SpO2 98.00%.  Gen: alert, no distress  HEENT: EOMI, sclera anicteric, throat clear  Neck: no JVD, no LAN  Chest: clear bilat, no rales or rhonchi  CV: regular, no rub or gallop, 2/6 SEM, pedal pulses intact  Abdomen: soft, nontender, no HSM or ascites, no mass  Ext: no LE edema, no joint effusion or deformity, no gangrene or ulceration  Neuro: alert, Ox3, no focal deficit  Access: L arm access  patent   Outpatient HD: [place], MWF   Impression/Plan  1. Fevers / gram positive bacteremia- empiric abx, awaiting ID/sensitivities. No obvious source on exam. Fevers down 2. ESRD- cont mwf HD 3. HTN/volume- no vol excess 4. Anemia of CKD- get EPO dose from OP records 5. Secondary HPTH- cont Fosrenol 6. Multiple sclerosis 7. Aortic stenosis 8. Hx failed renal transplant   Vinson Moselle  MD [phone] pgr    [phone] cell 11/15/2012, 11:49 AM

## 2012-11-15 NOTE — Progress Notes (Signed)
Patient ID: [name]  male  ZOX:[id number]    DOB: [birth year]    DOA: 11/12/2012  PCP: Dina Rich, MD  Assessment/Plan: Principal Problem:   Fevers/ GPC BACTEREMIA: Unclear etiology, chest x-ray clear, patient states he did have on-and-off cough, remote history of staph bacteremia. Flu negative.  - Blood cultures were positive for GPC in chains, however I was called by microbiology that it is diphtheroids, since it was 2/2 and patient had presented with fever, called ID consult, will follow recommendations. - 2 sets of blood cultures repeat negative so far - C. Difficile PCR negative - Receiving vancomycin with hemodialysis  Active Problems:   ESRD on hemodialysis: Renal service consulted, hemodialysis, MWF    Aortic valve disease/history of aortic stenosis    Renal transplant failure and rejection:  on hemodialysis    Multiple sclerosis  Hypertension: Stable  DVT Prophylaxis:  Code Status: Full  Disposition:     Subjective: Ht today, sitting up in the chair, no specific complaints, afebrile  Objective: Weight change: -3.7 kg (-8 lb 2.5 oz)  Intake/Output Summary (Last 24 hours) at 11/15/12 1432 Last data filed at 11/15/12 1130  Gross per 24 hour  Intake    240 ml  Output   2000 ml  Net  -1760 ml   Blood pressure 109/34, pulse 70, temperature 98.4 F (36.9 C), temperature source Oral, resp. rate 18, height 5\' 10"  (1.778 m), weight 77.1 kg (169 lb 15.6 oz), SpO2 95.00%.  Physical Exam: General: Alert and awake, oriented x3, not in any acute distress. CVS: S1-S2 clear, no murmur rubs or gallops Chest: CTAB Abdomen: soft NT, ND, NBS Extremities: no c/c/e bilaterally, left arm access appears stable  No rashes  Lab Results: Basic Metabolic Panel:  Recent Labs Lab 11/13/12 1120  11/15/12 0822  NA 135  --  138  K 5.9*  --  4.6  CL 93*  --  98  CO2 21  --  22  GLUCOSE 95  --  145*  BUN 82*  --  69*  CREATININE 11.78*  --  9.97*  CALCIUM 8.8  --  8.9   PHOS  --   < > 4.3  < > = values in this interval not displayed. Liver Function Tests:  Recent Labs Lab 11/12/12 1646 11/15/12 0822  AST 21  --   ALT 16  --   ALKPHOS 60  --   BILITOT 0.3  --   PROT 7.6  --   ALBUMIN 3.1* 2.9*   No results found for this basename: LIPASE, AMYLASE,  in the last 168 hours No results found for this basename: AMMONIA,  in the last 168 hours CBC:  Recent Labs Lab 11/12/12 1646 11/13/12 0752 11/15/12 0822  WBC 9.6 9.3 6.3  NEUTROABS 8.5*  --   --   HGB 12.2* 10.8* 11.1*  HCT 36.5* 31.9* 32.3*  MCV 99.7 97.9 94.7  PLT 151 137* 136*   Studies/Results: Dg Chest 2 View  11/12/2012  *RADIOLOGY REPORT*  Clinical Data: Fever, cough, congestion, hypertension, atrial fibrillation, end-stage renal disease on dialysis  CHEST - 2 VIEW  Comparison: 02/12/2012  Findings: Enlargement of cardiac silhouette with pulmonary vascular congestion. Calcified tortuous aorta. Lungs clear. No pleural effusion or pneumothorax. Bones unremarkable.  IMPRESSION: Enlargement of cardiac silhouette with pulmonary vascular congestion. No definite acute infiltrate.   Original Report Authenticated By: Ulyses Southward, M.D.     Medications: Scheduled Meds: . aspirin EC  81 mg Oral Daily  . calcium  acetate  2,001 mg Oral TID WC  . citalopram  20 mg Oral QHS  . darbepoetin (ARANESP) injection - DIALYSIS  60 mcg Intravenous Q Wed-HD  . docusate sodium  400 mg Oral BID  . ferric gluconate (FERRLECIT/NULECIT) IV  62.5 mg Intravenous Weekly  . folic acid  1 mg Oral Daily  . levofloxacin (LEVAQUIN) IV  500 mg Intravenous Q48H  . metoprolol tartrate  25 mg Oral Custom   And  . metoprolol tartrate  50 mg Oral Custom  . simvastatin  10 mg Oral QHS  . sodium chloride  3 mL Intravenous Q12H  . vancomycin  750 mg Intravenous Q M,W,F-HD      LOS: 3 days   RAI,RIPUDEEP M.D. [facility] 11/15/2012, 2:32 PM Pager: [phone]  If 7PM-7AM, please contact  night-coverage [email or web address] Password [email or web address]

## 2012-11-15 NOTE — Procedures (Signed)
I was present at this dialysis session. I have reviewed the session itself and made appropriate changes.   Vinson Moselle, MD [employer] [employer] 11/15/2012, 9:52 AM

## 2012-11-16 LAB — OVA AND PARASITE EXAMINATION: Ova and parasites: NONE SEEN

## 2012-11-16 MED ORDER — VANCOMYCIN HCL 1000 MG IV SOLR: 750.0000 mg | INTRAVENOUS | Status: DC

## 2012-11-16 NOTE — Discharge Summary (Signed)
Physician Discharge Summary  Patient ID: [name] MRN: [id number] DOB/AGE: July 03, 1956 57 y.o.  Admit date: 11/12/2012 Discharge date: 11/16/2012  Primary Care Physician:  Dina Rich, MD  Discharge Diagnoses:    . Fever . Multiple sclerosis . Renal transplant failure and rejection . Aortic valve disease . GPC Bacteremia  Consults:  Renal service, Dr. Arlean Hopping                     Infectious disease, Dr. Luciana Axe  Discharge Medications:   Medication List    TAKE these medications       acetaminophen 500 MG tablet  Commonly known as:  TYLENOL  Take 1,000 mg by mouth every 6 (six) hours as needed for pain or fever.     aspirin EC 81 MG tablet  Take 81 mg by mouth daily.     b complex-vitamin c-folic acid 0.8 MG Tabs  Take 0.8 mg by mouth daily.     calcium acetate 667 MG capsule  Commonly known as:  PHOSLO  Take 2,001 mg by mouth 3 (three) times daily with meals.     citalopram 20 MG tablet  Commonly known as:  CELEXA  Take 20 mg by mouth daily.     docusate sodium 100 MG capsule  Commonly known as:  COLACE  Take 400 mg by mouth 2 (two) times daily.     folic acid 1 MG tablet  Commonly known as:  FOLVITE  Take 1 mg by mouth daily.     gabapentin 300 MG capsule  Commonly known as:  NEURONTIN  Take 300 mg by mouth every hemodialysis. Take on Monday, Wednesday, and Friday     lanthanum 1000 MG chewable tablet  Commonly known as:  FOSRENOL  Chew 2,000 mg by mouth 3 (three) times daily. Takes with meals and snacks     metoprolol 50 MG tablet  Commonly known as:  LOPRESSOR  Take 50 mg by mouth 2 (two) times daily. 25mg  twice daily on dialysis days (M,W,F)  Non-diaylsis days:  50mg  in the morning 25mg  in the evening     omeprazole 20 MG tablet  Commonly known as:  PRILOSEC OTC  Take 20 mg by mouth 2 (two) times daily.     PROBIOTIC PO  Take 1 capsule by mouth 2 (two) times daily.     simvastatin 20 MG tablet  Commonly known as:  ZOCOR  Take 10 mg by  mouth daily.     sodium chloride 0.9 % SOLN 150 mL with vancomycin 1000 MG SOLR 750 mg  Inject 750 mg into the vein every Monday, Wednesday, and Friday with hemodialysis. For 2 weeks         Brief H and P: For complete details please refer to admission H and P, but in brief 57 yo male esrd dialysis m/w/f on diaylsis over 10 years, had membranous glomerapnephropathy s/p transfplant then subsequently developed same issue in his transplant kidney and rejected about one month postop is otherwise overall healthy comes in with fever for one day. Coughing for about one week with some nasal congestion and body aches. No n/v no diarrhea. No cp/sob/abd pain. No rashes, no le edema or swelling. Not on abx frequently.   Hospital Course:  Fevers: Patient was admitted for complete workup. Chest x-ray was clear for any pneumonia, patient had stated that he had remote history of staph bacteremia and blood cultures were obtained. Flu PCR was negative. C. difficile PCR was negative. Blood cultures initially  came back positive for GPC in chains, however related corrected by microbiology for diphtheroids. Patient was started on IV vancomycin with hemodialysis. As blood cultures were positive 2/2 and patient had presented with fever, infectious disease consultation was obtained. Patient was seen by Dr. Luciana Axe who recommended treating it with vancomycin with HD for 2 weeks. Repeat blood cultures were obtained and has been negative so far. AV fistula did not show signs of gross infection.   ESRD on hemodialysis: Renal service was consulted and patient was continued on hemodialysis, MWF  Aortic valve disease/history of aortic stenosis  Renal transplant failure and rejection: on hemodialysis      Day of Discharge BP 123/33  Pulse 69  Temp(Src) 98.1 F (36.7 C) (Oral)  Resp 16  Ht 5\' 10"  (1.778 m)  Wt 77.1 kg (169 lb 15.6 oz)  BMI 24.39 kg/m2  SpO2 92%  Physical Exam: General: Alert and awake oriented x3  not in any acute distress. HEENT: anicteric sclera, pupils reactive to light and accommodation CVS: S1-S2 clear no murmur rubs or gallops Chest: clear to auscultation bilaterally, no wheezing rales or rhonchi Abdomen: soft nontender, nondistended, normal bowel sounds, no organomegaly Extremities: no cyanosis, clubbing or edema noted bilaterally Neuro: Cranial nerves II-XII intact, no focal neurological deficits   The results of significant diagnostics from this hospitalization (including imaging, microbiology, ancillary and laboratory) are listed below for reference.    LAB RESULTS: Basic Metabolic Panel:  Recent Labs Lab 11/13/12 1120  11/15/12 0822  NA 135  --  138  K 5.9*  --  4.6  CL 93*  --  98  CO2 21  --  22  GLUCOSE 95  --  145*  BUN 82*  --  69*  CREATININE 11.78*  --  9.97*  CALCIUM 8.8  --  8.9  PHOS  --   < > 4.3  < > = values in this interval not displayed. Liver Function Tests:  Recent Labs Lab 11/12/12 1646 11/15/12 0822  AST 21  --   ALT 16  --   ALKPHOS 60  --   BILITOT 0.3  --   PROT 7.6  --   ALBUMIN 3.1* 2.9*   No results found for this basename: LIPASE, AMYLASE,  in the last 168 hours No results found for this basename: AMMONIA,  in the last 168 hours CBC:  Recent Labs Lab 11/12/12 1646 11/13/12 0752 11/15/12 0822  WBC 9.6 9.3 6.3  NEUTROABS 8.5*  --   --   HGB 12.2* 10.8* 11.1*  HCT 36.5* 31.9* 32.3*  MCV 99.7 97.9 94.7  PLT 151 137* 136*   Cardiac Enzymes: No results found for this basename: CKTOTAL, CKMB, CKMBINDEX, TROPONINI,  in the last 168 hours BNP: No components found with this basename: POCBNP,  CBG: No results found for this basename: GLUCAP,  in the last 168 hours  Significant Diagnostic Studies:  Dg Chest 2 View  11/12/2012  *RADIOLOGY REPORT*  Clinical Data: Fever, cough, congestion, hypertension, atrial fibrillation, end-stage renal disease on dialysis  CHEST - 2 VIEW  Comparison: 02/12/2012  Findings: Enlargement of  cardiac silhouette with pulmonary vascular congestion. Calcified tortuous aorta. Lungs clear. No pleural effusion or pneumothorax. Bones unremarkable.  IMPRESSION: Enlargement of cardiac silhouette with pulmonary vascular congestion. No definite acute infiltrate.   Original Report Authenticated By: Ulyses Southward, M.D.     2D ECHO:   Disposition and Follow-up:     Discharge Orders   Future Orders Complete By Expires  Diet - low sodium heart healthy  As directed     Increase activity slowly  As directed         DISPOSITION: Home DIET: Heart healthy diet ACTIVITY: As tolerated  DISCHARGE FOLLOW-UP Follow-up Information   Follow up with DOUGH,ROBERT, MD. Schedule an appointment as soon as possible for a visit in 10 days. (for hospital follow-up)    Contact information:   [address] [place] [address] [phone]       Follow up with Garnetta Buddy, MD. Schedule an appointment as soon as possible for a visit in 2 weeks. (for hospital follow-up )    Contact information:   [address] [place] [address] [phone]       Time spent on Discharge: 37 mins  Signed:   RAI,RIPUDEEP M.D. [facility] 11/16/2012, 9:55 AM Pager: [phone]

## 2012-11-16 NOTE — Progress Notes (Signed)
Subjective: Up in room, no more fevers, blood cx's + for diptheroids  Objective Vital signs in last 24 hours: Filed Vitals:   11/15/12 1423 11/15/12 2126 11/16/12 0515 11/16/12 1000  BP: 109/34 109/31 123/33 122/39  Pulse: 70 74 69 68  Temp: 98.4 F (36.9 C) 98.3 F (36.8 C) 98.1 F (36.7 C)   TempSrc:      Resp: 18 18 16    Height:      Weight:      SpO2: 95% 97% 92%    Weight change: 0 kg (0 lb)  Intake/Output Summary (Last 24 hours) at 11/16/12 1323 Last data filed at 11/16/12 1006  Gross per 24 hour  Intake      3 ml  Output      0 ml  Net      3 ml   Labs: Basic Metabolic Panel:  Recent Labs Lab 11/12/12 1646 11/13/12 1120 11/13/12 1123 11/15/12 0822  NA 137 135  --  138  K 5.0 5.9*  --  4.6  CL 93* 93*  --  98  CO2 28 21  --  22  GLUCOSE 83 95  --  145*  BUN 65* 82*  --  69*  CREATININE 10.56* 11.78*  --  9.97*  CALCIUM 9.1 8.8  --  8.9  PHOS  --   --  5.5* 4.3   Liver Function Tests:  Recent Labs Lab 11/12/12 1646 11/15/12 0822  AST 21  --   ALT 16  --   ALKPHOS 60  --   BILITOT 0.3  --   PROT 7.6  --   ALBUMIN 3.1* 2.9*   No results found for this basename: LIPASE, AMYLASE,  in the last 168 hours No results found for this basename: AMMONIA,  in the last 168 hours CBC:  Recent Labs Lab 11/12/12 1646 11/13/12 0752 11/15/12 0822  WBC 9.6 9.3 6.3  NEUTROABS 8.5*  --   --   HGB 12.2* 10.8* 11.1*  HCT 36.5* 31.9* 32.3*  MCV 99.7 97.9 94.7  PLT 151 137* 136*   PT/INR: @LABRCNTIP (inr:5)   Scheduled Meds ) . aspirin EC  81 mg Oral Daily  . calcium acetate  2,001 mg Oral TID WC  . citalopram  20 mg Oral QHS  . darbepoetin (ARANESP) injection - DIALYSIS  60 mcg Intravenous Q Wed-HD  . docusate sodium  400 mg Oral BID  . ferric gluconate (FERRLECIT/NULECIT) IV  62.5 mg Intravenous Weekly  . folic acid  1 mg Oral Daily  . levofloxacin (LEVAQUIN) IV  500 mg Intravenous Q48H  . metoprolol tartrate  25 mg Oral Custom   And  .  metoprolol tartrate  50 mg Oral Custom  . simvastatin  10 mg Oral QHS  . sodium chloride  3 mL Intravenous Q12H  . vancomycin  750 mg Intravenous Q M,W,F-HD    Physical Exam:  Blood pressure 122/39, pulse 68, temperature 98.1 F (36.7 C), temperature source Oral, resp. rate 16, height 5\' 10"  (1.778 m), weight 77.1 kg (169 lb 15.6 oz), SpO2 92.00%.  Gen: alert, no distress  HEENT: EOMI, sclera anicteric, throat clear  Neck: no JVD, no LAN  Chest: clear bilat, no rales or rhonchi  CV: regular, no rub or gallop, 2/6 SEM, pedal pulses intact  Abdomen: soft, nontender, no HSM or ascites, no mass  Ext: no LE edema, no joint effusion or deformity, no gangrene or ulceration  Neuro: alert, Ox3, no focal deficit  Access: L  arm access patent, no abcess or erythema   Outpatient HD: [place], MWF   Impression/Plan  1. Fevers / +blood cx's for diptheroids- responding to abx, cont vanc x 2 weeks per ID.  No signs of metastatic infection, and AF fistula showing no signs of gross infection. If infection recurs will need further work-up to look for deep-seated infection. I've updated patient's cardiologist (Dr Emogene Morgan, [facility]) and primary MD (Dr Sol Passer, [facility]) with details of admission per family request 2. ESRD- cont mwf HD 3. HTN/volume- no vol excess 4. Anemia of CKD- get EPO dose from OP records 5. Secondary HPTH- cont Fosrenol 6. Multiple sclerosis 7. Aortic stenosis 8. Hx failed renal transplant   Vinson Moselle  MD [phone] pgr    [phone] cell 11/16/2012, 1:23 PM

## 2012-11-17 LAB — CULTURE, RESPIRATORY W GRAM STAIN

## 2012-11-18 LAB — STOOL CULTURE

## 2012-11-20 LAB — CULTURE, BLOOD (ROUTINE X 2)
Culture: NO GROWTH
Culture: NO GROWTH

## 2012-12-04 ENCOUNTER — Observation Stay (HOSPITAL_COMMUNITY)
Admission: EM | Admit: 2012-12-04 | Discharge: 2012-12-05 | Disposition: A | Discharge status: Home / Self Care | Payer: Medicare Other | Attending: Family Medicine | Admitting: Family Medicine

## 2012-12-04 ENCOUNTER — Emergency Department (HOSPITAL_COMMUNITY): Payer: Medicare Other

## 2012-12-04 ENCOUNTER — Encounter (HOSPITAL_COMMUNITY): Payer: Self-pay | Admitting: *Deleted

## 2012-12-04 DIAGNOSIS — I351 Nonrheumatic aortic (valve) insufficiency: Secondary | ICD-10-CM | POA: Diagnosis present

## 2012-12-04 DIAGNOSIS — I120 Hypertensive chronic kidney disease with stage 5 chronic kidney disease or end stage renal disease: Secondary | ICD-10-CM | POA: Insufficient documentation

## 2012-12-04 DIAGNOSIS — R55 Syncope and collapse: Secondary | ICD-10-CM

## 2012-12-04 DIAGNOSIS — R197 Diarrhea, unspecified: Secondary | ICD-10-CM

## 2012-12-04 DIAGNOSIS — Z940 Kidney transplant status: Secondary | ICD-10-CM | POA: Insufficient documentation

## 2012-12-04 DIAGNOSIS — R079 Chest pain, unspecified: Principal | ICD-10-CM

## 2012-12-04 DIAGNOSIS — T8611 Kidney transplant rejection: Secondary | ICD-10-CM

## 2012-12-04 DIAGNOSIS — G35 Multiple sclerosis: Secondary | ICD-10-CM

## 2012-12-04 DIAGNOSIS — Z992 Dependence on renal dialysis: Secondary | ICD-10-CM | POA: Insufficient documentation

## 2012-12-04 DIAGNOSIS — Z79899 Other long term (current) drug therapy: Secondary | ICD-10-CM | POA: Insufficient documentation

## 2012-12-04 DIAGNOSIS — R0602 Shortness of breath: Secondary | ICD-10-CM

## 2012-12-04 DIAGNOSIS — R0609 Other forms of dyspnea: Secondary | ICD-10-CM

## 2012-12-04 DIAGNOSIS — I080 Rheumatic disorders of both mitral and aortic valves: Secondary | ICD-10-CM | POA: Insufficient documentation

## 2012-12-04 DIAGNOSIS — R7881 Bacteremia: Secondary | ICD-10-CM | POA: Diagnosis present

## 2012-12-04 DIAGNOSIS — A0472 Enterocolitis due to Clostridium difficile, not specified as recurrent: Secondary | ICD-10-CM

## 2012-12-04 DIAGNOSIS — I959 Hypotension, unspecified: Secondary | ICD-10-CM

## 2012-12-04 DIAGNOSIS — T8612 Kidney transplant failure: Secondary | ICD-10-CM | POA: Diagnosis present

## 2012-12-04 DIAGNOSIS — R509 Fever, unspecified: Secondary | ICD-10-CM

## 2012-12-04 DIAGNOSIS — I359 Nonrheumatic aortic valve disorder, unspecified: Secondary | ICD-10-CM

## 2012-12-04 DIAGNOSIS — N186 End stage renal disease: Secondary | ICD-10-CM | POA: Insufficient documentation

## 2012-12-04 DIAGNOSIS — I2510 Atherosclerotic heart disease of native coronary artery without angina pectoris: Secondary | ICD-10-CM | POA: Insufficient documentation

## 2012-12-04 DIAGNOSIS — D696 Thrombocytopenia, unspecified: Secondary | ICD-10-CM | POA: Diagnosis present

## 2012-12-04 DIAGNOSIS — D638 Anemia in other chronic diseases classified elsewhere: Secondary | ICD-10-CM | POA: Insufficient documentation

## 2012-12-04 LAB — CBC
MCV: 98.8 fL (ref 78.0–100.0)
Platelets: 212 10*3/uL (ref 150–400)
RDW: 15 % (ref 11.5–15.5)
WBC: 10.4 10*3/uL (ref 4.0–10.5)

## 2012-12-04 LAB — CBC WITH DIFFERENTIAL/PLATELET
Basophils Absolute: 0 10*3/uL (ref 0.0–0.1)
HCT: 33.3 % — ABNORMAL LOW (ref 39.0–52.0)
Hemoglobin: 11.1 g/dL — ABNORMAL LOW (ref 13.0–17.0)
Lymphocytes Relative: 8 % — ABNORMAL LOW (ref 12–46)
Monocytes Absolute: 1 10*3/uL (ref 0.1–1.0)
Neutro Abs: 7.1 10*3/uL (ref 1.7–7.7)
RDW: 15.2 % (ref 11.5–15.5)
WBC: 9 10*3/uL (ref 4.0–10.5)

## 2012-12-04 LAB — POCT I-STAT, CHEM 8
BUN: 81 mg/dL — ABNORMAL HIGH (ref 6–23)
Calcium, Ion: 1.09 mmol/L — ABNORMAL LOW (ref 1.12–1.23)
Chloride: 101 mEq/L (ref 96–112)

## 2012-12-04 LAB — HEPATIC FUNCTION PANEL
ALT: 15 U/L (ref 0–53)
AST: 21 U/L (ref 0–37)
Alkaline Phosphatase: 86 U/L (ref 39–117)
Bilirubin, Direct: <0.1 mg/dL (ref 0.0–0.3)
Total Bilirubin: 0.2 mg/dL — ABNORMAL LOW (ref 0.3–1.2)

## 2012-12-04 LAB — POCT I-STAT TROPONIN I: Troponin i, poc: 0.36 ng/mL (ref 0.00–0.08)

## 2012-12-04 LAB — TROPONIN I: Troponin I: 0.65 ng/mL (ref <0.30)

## 2012-12-04 LAB — CREATININE, SERUM: GFR calc Af Amer: 6 mL/min — ABNORMAL LOW (ref >90)

## 2012-12-04 MED ORDER — METOPROLOL TARTRATE 50 MG PO TABS: 50.0000 mg | ORAL_TABLET | Freq: Two times a day (BID) | ORAL | Status: DC

## 2012-12-04 MED ORDER — SODIUM CHLORIDE 0.9 % IJ SOLN
3.0000 mL | Freq: Two times a day (BID) | INTRAMUSCULAR | Status: DC
  Administered 2012-12-04 – 2012-12-05 (×3): 3 mL via INTRAVENOUS

## 2012-12-04 MED ORDER — ENOXAPARIN SODIUM 30 MG/0.3ML ~~LOC~~ SOLN
30.0000 mg | SUBCUTANEOUS | Status: DC
  Administered 2012-12-04: 30 mg via SUBCUTANEOUS
  Filled 2012-12-04 (×2): qty 0.3

## 2012-12-04 MED ORDER — SODIUM CHLORIDE 0.9 % IV SOLN: 250.0000 mL | INTRAVENOUS | Status: DC | PRN

## 2012-12-04 MED ORDER — LEVALBUTEROL HCL 0.63 MG/3ML IN NEBU
0.6300 mg | INHALATION_SOLUTION | Freq: Four times a day (QID) | RESPIRATORY_TRACT | Status: DC
  Administered 2012-12-04 – 2012-12-05 (×2): 0.63 mg via RESPIRATORY_TRACT
  Filled 2012-12-04 (×5): qty 3

## 2012-12-04 MED ORDER — CITALOPRAM HYDROBROMIDE 20 MG PO TABS
20.0000 mg | ORAL_TABLET | Freq: Every day | ORAL | Status: DC
  Filled 2012-12-04: qty 1

## 2012-12-04 MED ORDER — DOCUSATE SODIUM 100 MG PO CAPS
400.0000 mg | ORAL_CAPSULE | Freq: Two times a day (BID) | ORAL | Status: DC
  Administered 2012-12-04: 400 mg via ORAL
  Filled 2012-12-04 (×3): qty 4

## 2012-12-04 MED ORDER — CALCIUM ACETATE 667 MG PO CAPS
2001.0000 mg | ORAL_CAPSULE | Freq: Three times a day (TID) | ORAL | Status: DC
Start: 2012-12-04 — End: 2012-12-05
  Administered 2012-12-04 – 2012-12-05 (×4): 2001 mg via ORAL
  Filled 2012-12-04 (×7): qty 3

## 2012-12-04 MED ORDER — DEXTROSE 50 % IV SOLN
1.0000 | Freq: Once | INTRAVENOUS | Status: AC
  Administered 2012-12-04: 50 mL via INTRAVENOUS
  Filled 2012-12-04: qty 50

## 2012-12-04 MED ORDER — ASPIRIN EC 81 MG PO TBEC
81.0000 mg | DELAYED_RELEASE_TABLET | Freq: Every day | ORAL | Status: DC
  Administered 2012-12-04 – 2012-12-05 (×2): 81 mg via ORAL
  Filled 2012-12-04 (×2): qty 1

## 2012-12-04 MED ORDER — SODIUM CHLORIDE 0.9 % IJ SOLN: 3.0000 mL | INTRAMUSCULAR | Status: DC | PRN

## 2012-12-04 MED ORDER — OMEPRAZOLE 20 MG PO CPDR
20.0000 mg | DELAYED_RELEASE_CAPSULE | Freq: Two times a day (BID) | ORAL | Status: DC
  Administered 2012-12-04 – 2012-12-05 (×3): 20 mg via ORAL
  Filled 2012-12-04 (×4): qty 1

## 2012-12-04 MED ORDER — SIMVASTATIN 10 MG PO TABS
10.0000 mg | ORAL_TABLET | Freq: Every day | ORAL | Status: DC
  Administered 2012-12-04: 10 mg via ORAL
  Filled 2012-12-04 (×2): qty 1

## 2012-12-04 MED ORDER — ACETAMINOPHEN 325 MG PO TABS: 650.0000 mg | ORAL_TABLET | Freq: Four times a day (QID) | ORAL | Status: DC | PRN

## 2012-12-04 MED ORDER — GABAPENTIN 300 MG PO CAPS
300.0000 mg | ORAL_CAPSULE | ORAL | Status: DC
  Administered 2012-12-04: 300 mg via ORAL
  Filled 2012-12-04: qty 1

## 2012-12-04 MED ORDER — FOLIC ACID 1 MG PO TABS
1.0000 mg | ORAL_TABLET | Freq: Every day | ORAL | Status: DC
  Administered 2012-12-04 – 2012-12-05 (×2): 1 mg via ORAL
  Filled 2012-12-04 (×2): qty 1

## 2012-12-04 MED ORDER — METOPROLOL TARTRATE 25 MG PO TABS
25.0000 mg | ORAL_TABLET | ORAL | Status: DC
  Filled 2012-12-04: qty 1

## 2012-12-04 MED ORDER — INSULIN ASPART 100 UNIT/ML ~~LOC~~ SOLN
10.0000 [IU] | Freq: Once | SUBCUTANEOUS | Status: AC
  Administered 2012-12-04: 10 [IU] via INTRAVENOUS
  Filled 2012-12-04: qty 1

## 2012-12-04 MED ORDER — ACETAMINOPHEN 650 MG RE SUPP: 650.0000 mg | Freq: Four times a day (QID) | RECTAL | Status: DC | PRN

## 2012-12-04 MED ORDER — ONDANSETRON HCL 4 MG PO TABS: 4.0000 mg | ORAL_TABLET | Freq: Four times a day (QID) | ORAL | Status: DC | PRN

## 2012-12-04 MED ORDER — METOPROLOL TARTRATE 50 MG PO TABS
50.0000 mg | ORAL_TABLET | ORAL | Status: DC
  Filled 2012-12-04: qty 1

## 2012-12-04 MED ORDER — METOPROLOL TARTRATE 25 MG PO TABS
25.0000 mg | ORAL_TABLET | ORAL | Status: DC
  Administered 2012-12-04 – 2012-12-05 (×3): 25 mg via ORAL
  Filled 2012-12-04 (×2): qty 1

## 2012-12-04 MED ORDER — CITALOPRAM HYDROBROMIDE 20 MG PO TABS
20.0000 mg | ORAL_TABLET | Freq: Every day | ORAL | Status: DC
  Administered 2012-12-04: 20 mg via ORAL
  Filled 2012-12-04 (×2): qty 1

## 2012-12-04 MED ORDER — SODIUM CHLORIDE 0.9 % IJ SOLN: 3.0000 mL | Freq: Two times a day (BID) | INTRAMUSCULAR | Status: DC

## 2012-12-04 MED ORDER — ONDANSETRON HCL 4 MG/2ML IJ SOLN: 4.0000 mg | Freq: Four times a day (QID) | INTRAMUSCULAR | Status: DC | PRN

## 2012-12-04 MED ORDER — ONDANSETRON HCL 4 MG/2ML IJ SOLN: 4.0000 mg | Freq: Three times a day (TID) | INTRAMUSCULAR | Status: DC | PRN

## 2012-12-04 MED ORDER — LEVALBUTEROL HCL 0.63 MG/3ML IN NEBU
0.6300 mg | INHALATION_SOLUTION | Freq: Four times a day (QID) | RESPIRATORY_TRACT | Status: DC
  Administered 2012-12-04: 0.63 mg via RESPIRATORY_TRACT
  Filled 2012-12-04: qty 3

## 2012-12-04 NOTE — Progress Notes (Signed)
Patient seen and examined, admitted to [facility], has mild elevation of troponin 0.65. He denies any chest pain.  Filed Vitals:   12/04/12 1355  BP: 149/55  Pulse: 63  Temp: 97.6 F (36.4 C)  Resp: 18    Chest: Clear Bilaterally Heart : S1S2 RRR Abdomen: Soft, nontender Ext : No edema Neuro: Alert, oriented x 3  A/P  Chest pain Mild elevation of troponin Called Dr Donnie Aho, he will see the patient today.  ESRD Called nephrology Dr Hyman Hopes, who will get the patient dialyzed today.   Meredeth Ide [facility] [phone]

## 2012-12-04 NOTE — Consult Note (Signed)
Cardiology Consult Note  Admit date: 12/04/2012 Name: [name] 57 y.o.  male DOB:  Aug 08, 1956 MRN:  [id number]  Today's date:  12/04/2012  Referring Physician:    [facility]  Primary Physician:  Dr. Elvis Coil  Reason for Consultation:   Chest tightness, abnormal troponin  IMPRESSIONS: 1. Prolonged chest tightness associated with mildly abnormal troponin. This could have been an ischemic event or could be a manifestation of volume overload due to the long interval since his last dialysis. He has had repeated episodes of thisover the past 2 years and has his attending cardiologist in [place]. 2. End-stage renal disease 3. Multiple sclerosis 4. Aortic regurgitation 5. History of coronary artery disease that specials undetermined 6. Recent bacteremia 7. History of flash pulmonary edema 1. History of paroxysmal atrial fibrillation  RECOMMENDATION: Obtain old medical records from [place]. The present time he has low-level elevations of troponin on lab testing and his EKG is nonischemic. I would go ahead and dialyze the extra fluid off this evening.pending results of the records from [facility], I think he can be observed and if he does not have recurrent chest symptoms could be discharged and have the discontinuation of his workup at [facility]. I would wonder with aortic valve disease and recent gram-positive bacteremia whether he needs to have a TEE done. In addition he may need to have a repeat catheterization if he continues to have repeat episodes of chest tightness like he is having.  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 He normally dialyzes Monday Wednesday and Friday. He also has significant aortic valve disease and has been told he has coronary artery disease on the basis of  a previous catheterization of unknown vessels. He states that he was told he was not a candidate for stenting and that these would be bypassed at the time his aortic valve was taken care of.  He describes recurrent episodes of some tightness in his chest to occur usually in the spring and usually on a weekend. He describes a feeling of midsternal tightness or heaviness with radiation to his jaw that last up to 30 minutes. He typically will occur at rest. He has been admitted a year ago with flash pulmonary edema associated with one of these episodes that resolved with dialysis. He has had evidently several emergency room visits for this over the years. Friday he saw his cardiologist at [facility] who did an echocardiogram and told him to watch carefully for other symptoms. He had the onset of midsternal tightness the last around 30 minutes yesterday morning and had recurrent symptoms yesterday prompting his wife to drive him to the emergency room here. Initial troponins were mildly elevated and have remained mildly elevated. EKG earlier today was nonischemic. His wife noted that he had significant rapid pulse and his fistula at the time and he also has a history of atrial fibrillation evidently in the past when he has had surgery  Past Medical History  Diagnosis Date  . Multiple sclerosis   . CAD (coronary artery disease)   . Aortic heart valve prolapse   . Peripheral vascular disease   . Sleep apnea     ordered bipap with 3L bled in, but pt will not wear  . Atrial fibrillation   . Multiple sclerosis   . 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  He had a renal Tx from 2008 to 2011.  Gets HD in [place], [place] on MWF schedule.        Past Surgical History  Procedure Laterality Date  . Av fistula placement    . Flash      flash pulmonary edema  . Kidney transplant  08/2007  .  Hernia repair  07/2011  . Excised squamous cells at rectum  2006     Allergies:  is allergic to diphenhydramine; penicillins; and adhesive.   Medications: Prior to Admission medications   Medication Sig Start Date End Date Taking? Authorizing Provider  acetaminophen (TYLENOL) 500 MG tablet Take 1,000 mg by mouth every 6 (six) hours as needed for pain or fever.   Yes Historical Provider, MD  aspirin EC 81 MG tablet Take 81 mg by mouth daily.   Yes Historical Provider, MD  b complex-vitamin c-folic acid (NEPHRO-VITE) 0.8 MG TABS Take 0.8 mg by mouth daily.    Yes Historical Provider, MD  calcium acetate (PHOSLO) 667 MG capsule Take 2,001 mg by mouth 3 (three) times daily with meals.   Yes Historical Provider, MD  citalopram (CELEXA) 20 MG tablet Take 20 mg by mouth daily.   Yes Historical Provider, MD  docusate sodium (COLACE) 100 MG capsule Take 400 mg by mouth 2 (two) times daily.   Yes Historical Provider, MD  folic acid (FOLVITE) 1 MG tablet Take 1 mg by mouth daily.   Yes Historical Provider, MD  gabapentin (NEURONTIN) 300 MG capsule Take 300 mg by mouth every hemodialysis. Take on Monday, Wednesday, and Friday   Yes Historical Provider, MD  lanthanum (FOSRENOL) 1000 MG chewable tablet Chew 2,000 mg by mouth 3 (three) times daily. Takes with meals and snacks   Yes Historical Provider, MD  metoprolol (LOPRESSOR) 50 MG tablet Take 50 mg by mouth 2 (two) times daily. 25mg  twice daily on dialysis days (M,W,F) Non-diaylsis days: 50mg  in the morning 25mg  in the evening   Yes Historical Provider, MD  omeprazole (PRILOSEC OTC) 20 MG tablet Take 20 mg by mouth 2 (two) times daily.    Yes Historical Provider, MD  Probiotic Product (PROBIOTIC PO) Take 1 capsule by mouth 2 (two) times daily.   Yes Historical Provider, MD  simvastatin (ZOCOR) 20 MG tablet Take 10 mg by mouth daily.   Yes Historical Provider, MD    Family History: No family status information on file.    Social History:    reports that he quit smoking about 14 years ago. His smoking use included Cigarettes. He has a 40 pack-year smoking history. He does not have any smokeless tobacco history on file. He reports that he does not drink alcohol or use illicit drugs.   History   Social History Narrative  . No narrative on file    Review of Systems: Other than as noted above the remainder of the review of systems is normal.  Physical Exam: BP 149/55  Pulse 63  Temp(Src) 97.6 F (36.4 C) (Oral)  Resp 18  Ht 6' (1.829 m)  Wt 83 kg (182 lb 15.7 oz)  BMI 24.81 kg/m2  SpO2 98%  General appearance: alert, cooperative, appears stated age and no distress Head: Normocephalic, without obvious abnormality, atraumatic Eyes: conjunctivae/corneas clear. PERRL, EOM's intact. Fundi benign. Neck: no adenopathy, no carotid bruit,  supple, symmetrical, trachea midline and Neck veins slightly increased Lungs: Minimal crackles at base Heart: Regular rhythm, normal S1-S2, no S3, 1-2/6 systolic murmur at aortic area, 2/6 diastolic murmur at left sternal border Abdomen: soft, non-tender; bowel sounds normal; no masses,  no organomegaly Rectal: deferred Extremities: well matured fistula present in the left forearm. Pulses: 2+ and symmetric Skin: Skin color, texture, turgor normal. No rashes or lesions Neurologic: Alert and oriented X 3, normal strength and tone. Normal symmetric reflexes. Normal coordination and gait  Labs: CBC  Recent Labs  12/04/12 0048  12/04/12 0839  WBC 9.0  --  10.4  RBC 3.47*  --  3.34*  HGB 11.1*  < > 10.8*  HCT 33.3*  < > 33.0*  PLT 201  --  212  MCV 96.0  --  98.8  MCH 32.0  --  32.3  MCHC 33.3  --  32.7  RDW 15.2  --  15.0  LYMPHSABS 0.7  --   --   MONOABS 1.0  --   --   EOSABS 0.2  --   --   BASOSABS 0.0  --   --   < > = values in this interval not displayed. CMP   Recent Labs  12/04/12 0058 12/04/12 0540 12/04/12 0839  NA 141  --   --   K 5.4*  --   --   CL 101  --   --    GLUCOSE 108*  --   --   BUN 81*  --   --   CREATININE 9.80*  --  10.46*  PROT  --  7.7  --   ALBUMIN  --  3.2*  --   AST  --  21  --   ALT  --  15  --   ALKPHOS  --  86  --   BILITOT  --  0.2*  --   GFRNONAA  --   --  5*  GFRAA  --   --  6*   Cardiac Panel (last 3 results)  Recent Labs  12/04/12 0835 12/04/12 1143  TROPONINI 0.65* 0.65*    Radiology: Cardiomegaly with pulmonary congestion and interstitial edema  EKG: Normal sinus rhythm with voltage for LVH, no ischemic ST changes  Signed:  W. Ashley Royalty MD [facility]   Cardiology Consultant  12/04/2012, 6:06 PM

## 2012-12-04 NOTE — ED Provider Notes (Signed)
History     CSN: [id number]  Arrival date & time 12/04/12  0015   First MD Initiated Contact with Patient 12/04/12 0100      Chief Complaint  Patient presents with  . Chest Pain    HPI [name] is a 57 y.o. male with a history of end-stage renal disease on hemodialysis (Monday Wednesday Friday from membranous glomerulonephritis - on hemodialysis since year 2000) who presents with chest pain. Patient is had similar chest pains over the course of the last 2 years with workups that have shown coronary artery disease, but has been medically managed at this point. Patient follows with Dr. Serita Butcher who is the patient's cardiologist at [facility]. Patient was told that he had problems with his valves and some narrowing in his arteries, and that this would all be taken care of at the same time with surgery, but for the time being will be medically managed. Yesterday, patient had onset of chest pain while laying in bed and asleep at 03 100, is described as an aching pain in the center of the chest it radiates to bilateral shoulders and up to the jaw, is not associated diaphoresis, shortness of breath or nausea or vomiting. The patient's pain is typical, it is been chronic, it lasted for 30 minutes to an hour. He did not take anything for it yesterday. This occurred twice during the early morning hours yesterday and then occurred again today starting between 11 and 12:00 PM.  Again lasted between 30 minutes to an hour, patient took some aspirin on the way and his pain is gone.  Wife and patient presented today because the patient pain became more frequent.  Patient says it typically happens always at night, is not associated with burning, has no history of GERD. Patient does not lie flat, he sleeps in a recliner.   Past Medical History  Diagnosis Date  . Multiple sclerosis   . CAD (coronary artery disease)   . Aortic heart valve prolapse   . Peripheral vascular disease   . Sleep apnea    ordered bipap with 3L bled in, but pt will not wear  . Atrial fibrillation   . Multiple sclerosis   . 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  He had a renal Tx from 2008 to 2011.  Gets HD in [place], [place] on MWF schedule.      Past Surgical History  Procedure Laterality Date  . Av fistula placement    . Flash      flash pulmonary edema  . Kidney transplant  08/2007  . Hernia repair  07/2011  . Excised squamous cells at rectum  2006    No family history on file.  History  Substance Use Topics  . Smoking status: Former Smoker -- 2.00 packs/day for 20 years    Types: Cigarettes    Quit date: 08/06/1998  . Smokeless tobacco: Not on file  . Alcohol Use: No      Review of Systems At least 10pt or greater review of systems completed and are negative except where specified in the HPI.  Allergies  Penicillins and Adhesive  Home Medications   Current Outpatient Rx  Name  Route  Sig  Dispense  Refill  . acetaminophen (TYLENOL) 500 MG tablet   Oral   Take 1,000 mg by mouth every 6 (six)  hours as needed for pain or fever.         Marland Kitchen aspirin EC 81 MG tablet   Oral   Take 81 mg by mouth daily.         Marland Kitchen b complex-vitamin c-folic acid (NEPHRO-VITE) 0.8 MG TABS   Oral   Take 0.8 mg by mouth daily.          . calcium acetate (PHOSLO) 667 MG capsule   Oral   Take 2,001 mg by mouth 3 (three) times daily with meals.         . citalopram (CELEXA) 20 MG tablet   Oral   Take 20 mg by mouth daily.         Marland Kitchen docusate sodium (COLACE) 100 MG capsule   Oral   Take 400 mg by mouth 2 (two) times daily.         . folic acid (FOLVITE) 1 MG tablet   Oral   Take 1 mg by mouth daily.         Marland Kitchen gabapentin (NEURONTIN) 300 MG capsule   Oral   Take 300 mg by mouth every hemodialysis. Take on Monday, Wednesday, and Friday         . lanthanum (FOSRENOL)  1000 MG chewable tablet   Oral   Chew 2,000 mg by mouth 3 (three) times daily. Takes with meals and snacks         . metoprolol (LOPRESSOR) 50 MG tablet   Oral   Take 50 mg by mouth 2 (two) times daily. 25mg  twice daily on dialysis days (M,W,F) Non-diaylsis days: 50mg  in the morning 25mg  in the evening         . omeprazole (PRILOSEC OTC) 20 MG tablet   Oral   Take 20 mg by mouth 2 (two) times daily.          . Probiotic Product (PROBIOTIC PO)   Oral   Take 1 capsule by mouth 2 (two) times daily.         . simvastatin (ZOCOR) 20 MG tablet   Oral   Take 10 mg by mouth daily.         . sodium chloride 0.9 % SOLN 150 mL with vancomycin 1000 MG SOLR 750 mg   Intravenous   Inject 750 mg into the vein every Monday, Wednesday, and Friday with hemodialysis. For 2 weeks           BP 140/37  Pulse 64  Temp(Src) 97.5 F (36.4 C) (Oral)  Resp 18  SpO2 92%  Physical Exam  Nursing notes reviewed.  Electronic medical record reviewed. VITAL SIGNS:   Filed Vitals:   12/04/12 0018 12/04/12 0042  BP: 150/61 140/37  Pulse: 68 64  Temp: 98 F (36.7 C) 97.5 F (36.4 C)  TempSrc: Oral Oral  Resp: 18 18  SpO2: 91% 92%   CONSTITUTIONAL: Awake, oriented x4, appears non-toxic HENT: Atraumatic, normocephalic, oral mucosa pink and moist, airway patent. Nares patent without drainage. External ears normal. EYES: Conjunctiva clear, EOMI, PERRLA NECK: Trachea midline, non-tender, supple CARDIOVASCULAR: Normal heart rate, Normal rhythm, 3/6 systolic ejection murmur right upper sternal border radiating to the carotids, 2/6 diastolic murmur heard best at the apex PULMONARY/CHEST: Clear to auscultation, no rhonchi, wheezes, or rales. Symmetrical breath sounds. Non-tender. ABDOMINAL: Non-distended, soft, non-tender - no rebound or guarding.  BS normal. NEUROLOGIC: Non-focal, moving all four extremities, no gross sensory or motor deficits. EXTREMITIES: No clubbing, cyanosis. 1+ lower  extremity pitting edema.  Fistula left forearm good pulsations and thrill. SKIN: Warm, Dry, No erythema, No rash  ED Course  Procedures (including critical care time)  Date: 12/04/2012  Rate: 67  Rhythm: normal sinus rhythm  QRS Axis: normal  Intervals: normal  ST/T Wave abnormalities: Prominent T wave  Conduction Disutrbances: none  Narrative Interpretation: Possible LVH prominent T waves, T waves are positively deflected on his EKG which is different from prior EKG   Labs Reviewed  CBC WITH DIFFERENTIAL - Abnormal; Notable for the following:    RBC 3.47 (*)    Hemoglobin 11.1 (*)    HCT 33.3 (*)    Neutrophils Relative 78 (*)    Lymphocytes Relative 8 (*)    All other components within normal limits  HEPATIC FUNCTION PANEL - Abnormal; Notable for the following:    Albumin 3.2 (*)    Total Bilirubin 0.2 (*)    All other components within normal limits  CBC - Abnormal; Notable for the following:    RBC 3.34 (*)    Hemoglobin 10.8 (*)    HCT 33.0 (*)    All other components within normal limits  POCT I-STAT, CHEM 8 - Abnormal; Notable for the following:    Potassium 5.4 (*)    BUN 81 (*)    Creatinine, Ser 9.80 (*)    Glucose, Bld 108 (*)    Calcium, Ion 1.09 (*)    Hemoglobin 11.6 (*)    HCT 34.0 (*)    All other components within normal limits  POCT I-STAT TROPONIN I - Abnormal; Notable for the following:    Troponin i, poc 0.36 (*)    All other components within normal limits  CREATININE, SERUM  PHOSPHORUS  MAGNESIUM  TROPONIN I  TROPONIN I  TROPONIN I  TSH   Dg Chest 2 View  12/04/2012  *RADIOLOGY REPORT*  Clinical Data: Acute onset mid chest pain radiating down the left arm.  CHEST - 2 VIEW  Comparison: 11/12/2012  Findings: Cardiac enlargement with mild pulmonary vascular congestion.  Interval progression since previous study with development of interstitial changes suggesting early interstitial edema.  No focal consolidation in the lungs.  No blunting of  costophrenic angles.  No pneumothorax.  Mediastinal contours appear intact.  Calcification of the aorta.  IMPRESSION: Cardiac enlargement with progression of vascular congestion and early developing interstitial edema.   Original Report Authenticated By: Burman Nieves, M.D.      1. Chest pain   2. Aortic valve disease   3. ESRD on hemodialysis       MDM  [name] is a 57 y.o. male who is in ESRD patient on Monday Wednesday Friday dialysis he sees Dr. Hyman Hopes, nephrology center is in [place]. Patient's had intermittent chest pain consistent with angina, he's not taken any nitroglycerin at home because he does not have any. There are no EKG changes, that his EKG looks better than it did previously where it had flipped T waves. His T waves do appear prominent - not necessarily peaked.  Potassium is slightly elevated at 5.4 we'll elect to treat with insulin and D50. BUN and creatinine are elevated as expected as he is due for dialysis tomorrow. CBC is unremarkable showing some chronic anemia likely secondary to kidney disease. LFTs are unremarkable as well. Stat troponin is elevated at 0.36, patient's previous troponins have been negative.  With ESRD, I would expect some low-level troponins however this patient's troponins have been negative in the past which makes me question that  this is an NSTEMI.  Currently pain-free at this time.  Discussed with [facility] for admission.      Jones Skene, MD 12/04/12 [id number]

## 2012-12-04 NOTE — Progress Notes (Signed)
Utilization Review Completed.Stormey Wilborn T3/31/2014  

## 2012-12-04 NOTE — H&P (Signed)
[facility] History and Physical  [name] NWG:[id number] DOB: [birth year] DOA: 12/04/2012  Referring physician:   PCP: Dina Rich, MD  Chest pain  HPI:  57 year old male with esrd dialysis m/w/f on diaylsis over 10 years, since 2000. had membranous glomerapnephropathy s/p transfplant then subsequently , status post transplant rejection, recently admitted for [facility] bacteremia and discharged on 3/13, who presents to the ER with episodic central chest pain radiating upwards into the joint bilateral shoulders.Marland Kitchen  He recently completed a two-week course of vancomycin.  He has had chest pains over the course of the last 2 years with workups that have shown coronary artery disease, but has been medically managed at this point. Patient follows with Dr. Serita Butcher who is the patient's cardiologist at [facility]. Patient was told that he had problems with his valves and some narrowing in his arteries, and that this would all be taken care of at the same time with surgery, but for the time being will be medically managed. Yesterday, patient had onset of chest pain while laying in bed and asleep at 03 100, is described as an aching pain in the center of the chest it radiates to bilateral shoulders and up to the jaw, is not associated diaphoresis, shortness of breath or nausea or vomiting. The patient's pain is typical, it is been chronic, it lasted for 30 minutes to an hour. He did not take anything for it yesterday. This occurred twice during the early morning hours yesterday and then occurred again today starting between 11 and 12:00 PM.       Review of Systems: negative for the following  Constitutional: Denies fever, chills, diaphoresis, appetite change and fatigue.  HEENT: Denies photophobia, eye pain, redness, hearing loss, ear pain, congestion, sore throat, rhinorrhea, sneezing, mouth sores, trouble swallowing, neck pain, neck stiffness and tinnitus.  Respiratory: Denies SOB, DOE, cough,  chest tightness, and wheezing.  Cardiovascular: Denies chest pain, palpitations and leg swelling.  Gastrointestinal: Denies nausea, vomiting, abdominal pain, diarrhea, constipation, blood in stool and abdominal distention.  Genitourinary: Denies dysuria, urgency, frequency, hematuria, flank pain and difficulty urinating.  Musculoskeletal: Denies myalgias, back pain, joint swelling, arthralgias and gait problem.  Skin: Denies pallor, rash and wound.  Neurological: Denies dizziness, seizures, syncope, weakness, light-headedness, numbness and headaches.  Hematological: Denies adenopathy. Easy bruising, personal or family bleeding history  Psychiatric/Behavioral: Denies suicidal ideation, mood changes, confusion, nervousness, sleep disturbance and agitation       Past Medical History  Diagnosis Date  . Multiple sclerosis   . CAD (coronary artery disease)   . Aortic heart valve prolapse   . Peripheral vascular disease   . Sleep apnea     ordered bipap with 3L bled in, but pt will not wear  . Atrial fibrillation   . Multiple sclerosis   . 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  He had a renal Tx from 2008 to 2011.  Gets HD in [place], [place] on MWF schedule.       Past Surgical History  Procedure Laterality Date  . Av fistula placement    . Flash      flash pulmonary edema  . Kidney transplant  08/2007  . Hernia repair  07/2011  . Excised squamous cells at rectum  2006      Social History:  reports that he quit smoking about 14 years  ago. His smoking use included Cigarettes. He has a 40 pack-year smoking history. He does not have any smokeless tobacco history on file. He reports that he does not drink alcohol or use illicit drugs.    Allergies  Allergen Reactions  . Diphenhydramine Itching    Only with IV doses. Tolerates oral.  . Penicillins Other (See  Comments)    migraine  . Adhesive (Tape) Rash    Please use paper tape   Family history negative for hypertension diabetes coronary artery disease   No family history on file.   Prior to Admission medications   Medication Sig Start Date End Date Taking? Authorizing Provider  acetaminophen (TYLENOL) 500 MG tablet Take 1,000 mg by mouth every 6 (six) hours as needed for pain or fever.   Yes Historical Provider, MD  aspirin EC 81 MG tablet Take 81 mg by mouth daily.   Yes Historical Provider, MD  b complex-vitamin c-folic acid (NEPHRO-VITE) 0.8 MG TABS Take 0.8 mg by mouth daily.    Yes Historical Provider, MD  calcium acetate (PHOSLO) 667 MG capsule Take 2,001 mg by mouth 3 (three) times daily with meals.   Yes Historical Provider, MD  citalopram (CELEXA) 20 MG tablet Take 20 mg by mouth daily.   Yes Historical Provider, MD  docusate sodium (COLACE) 100 MG capsule Take 400 mg by mouth 2 (two) times daily.   Yes Historical Provider, MD  folic acid (FOLVITE) 1 MG tablet Take 1 mg by mouth daily.   Yes Historical Provider, MD  gabapentin (NEURONTIN) 300 MG capsule Take 300 mg by mouth every hemodialysis. Take on Monday, Wednesday, and Friday   Yes Historical Provider, MD  lanthanum (FOSRENOL) 1000 MG chewable tablet Chew 2,000 mg by mouth 3 (three) times daily. Takes with meals and snacks   Yes Historical Provider, MD  metoprolol (LOPRESSOR) 50 MG tablet Take 50 mg by mouth 2 (two) times daily. 25mg  twice daily on dialysis days (M,W,F) Non-diaylsis days: 50mg  in the morning 25mg  in the evening   Yes Historical Provider, MD  omeprazole (PRILOSEC OTC) 20 MG tablet Take 20 mg by mouth 2 (two) times daily.    Yes Historical Provider, MD  Probiotic Product (PROBIOTIC PO) Take 1 capsule by mouth 2 (two) times daily.   Yes Historical Provider, MD  simvastatin (ZOCOR) 20 MG tablet Take 10 mg by mouth daily.   Yes Historical Provider, MD     Physical Exam: Filed Vitals:   12/04/12 0300 12/04/12  0330 12/04/12 0400 12/04/12 0430  BP: 135/43 135/46 178/45 143/37  Pulse: 63 57 72 60  Temp:      TempSrc:      Resp: 21 20 25 23   SpO2: 96% 94% 94% 93%     Constitutional: Vital signs reviewed. Patient is a well-developed and well-nourished in no acute distress and cooperative with exam. Alert and oriented x3.  Head: Normocephalic and atraumatic  Ear: TM normal bilaterally  Mouth: no erythema or exudates, MMM  Eyes: PERRL, EOMI, conjunctivae normal, No scleral icterus.  Neck: Supple, Trachea midline normal ROM, No JVD, mass, thyromegaly, or carotid bruit present.  Cardiovascular: RRR, S1 normal, S2 normal, no MRG, pulses symmetric and intact bilaterally  Pulmonary/Chest: CTAB, no wheezes, rales, or rhonchi  Abdominal: Soft. Non-tender, non-distended, bowel sounds are normal, no masses, organomegaly, or guarding present.  GU: no CVA tenderness Musculoskeletal: No joint deformities, erythema, or stiffness, ROM full and no nontender Ext: no edema and no cyanosis, pulses palpable bilaterally (DP  and PT)  Hematology: no cervical, inginal, or axillary adenopathy.  Neurological: A&O x3, Strenght is normal and symmetric bilaterally, cranial nerve II-XII are grossly intact, no focal motor deficit, sensory intact to light touch bilaterally.  Skin: Warm, dry and intact. No rash, cyanosis, or clubbing.  Psychiatric: Normal mood and affect. speech and behavior is normal. Judgment and thought content normal. Cognition and memory are normal.       Labs on Admission:    Basic Metabolic Panel:  Recent Labs Lab 12/04/12 0058  NA 141  K 5.4*  CL 101  GLUCOSE 108*  BUN 81*  CREATININE 9.80*   Liver Function Tests: No results found for this basename: AST, ALT, ALKPHOS, BILITOT, PROT, ALBUMIN,  in the last 168 hours No results found for this basename: LIPASE, AMYLASE,  in the last 168 hours No results found for this basename: AMMONIA,  in the last 168 hours CBC:  Recent Labs Lab  12/04/12 0048 12/04/12 0058  WBC 9.0  --   NEUTROABS 7.1  --   HGB 11.1* 11.6*  HCT 33.3* 34.0*  MCV 96.0  --   PLT 201  --    Cardiac Enzymes: No results found for this basename: CKTOTAL, CKMB, CKMBINDEX, TROPONINI,  in the last 168 hours  BNP (last 3 results) No results found for this basename: PROBNP,  in the last 8760 hours    CBG: No results found for this basename: GLUCAP,  in the last 168 hours  Radiological Exams on Admission: Dg Chest 2 View  12/04/2012  *RADIOLOGY REPORT*  Clinical Data: Acute onset mid chest pain radiating down the left arm.  CHEST - 2 VIEW  Comparison: 11/12/2012  Findings: Cardiac enlargement with mild pulmonary vascular congestion.  Interval progression since previous study with development of interstitial changes suggesting early interstitial edema.  No focal consolidation in the lungs.  No blunting of costophrenic angles.  No pneumothorax.  Mediastinal contours appear intact.  Calcification of the aorta.  IMPRESSION: Cardiac enlargement with progression of vascular congestion and early developing interstitial edema.   Original Report Authenticated By: Burman Nieves, M.D.     EKG: Independently reviewed.  Rhythm: normal sinus rhythm  QRS Axis: normal  Intervals: normal  ST/T Wave abnormalities: normal  Conduction Disutrbances: none  Narrative Interpretation: unremarkable   Assessment/Plan Principal Problem:   Chest pain Active Problems:   ESRD on hemodialysis   Thrombocytopenia   Aortic valve disease   Renal transplant failure and rejection   GPC Bacteremia   1. Chest pain we'll admit the patient to telemetry, we'll cycle cardiac enzymes. Obtain a 2-D echo. The patient has had workup at [facility] will obtain records before ordering further workup. The patient is not particularly hypoxic, chest x-ray shows mild pulmonary vascular congestion and is due for dialysis today, therefore doubt pulmonary embolism, he takes aspirin and metoprolol, worse  when he is lying down, consider GERD, will add liver function tests 2. End-stage renal disease-hemodialysis Monday Wednesday Friday, please call nephrology in the morning 3. Recent GPC bacteremia, completed a two-week course of vancomycin 4. Anemia of chronic disease stable 5. Thrombocytopenia resolved  Code Status:   full Family Communication: bedside Disposition Plan: admit   Time spent: 70 mins   [facility] Pager [phone]  If 7PM-7AM, please contact night-coverage [email or web address] Password [facility] 12/04/2012, 5:47 AM

## 2012-12-04 NOTE — Consult Note (Signed)
Referring Provider: No ref. provider found Primary Care Physician:  Dina Rich, MD Primary Nephrologist:  Dr. Allena Katz   Reason for Consultation:  Medical management of end stage renal disease  HPI: 57 year old male with esrd dialysis m/w/f on diaylsis over 10 years, since 2000. had membranous glomerapnephropathy s/p transfplant then subsequently which failed. He was admitted with chest pain and increase in troponins.   Past Medical History  Diagnosis Date  . Multiple sclerosis   . CAD (coronary artery disease)   . Aortic heart valve prolapse   . Peripheral vascular disease   . Sleep apnea     ordered bipap with 3L bled in, but pt will not wear  . Atrial fibrillation   . Multiple sclerosis   . 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  He had a renal Tx from 2008 to 2011.  Gets HD in [place], [place] on MWF schedule.      Past Surgical History  Procedure Laterality Date  . Av fistula placement    . Flash      flash pulmonary edema  . Kidney transplant  08/2007  . Hernia repair  07/2011  . Excised squamous cells at rectum  2006    Prior to Admission medications   Medication Sig Start Date End Date Taking? Authorizing Provider  acetaminophen (TYLENOL) 500 MG tablet Take 1,000 mg by mouth every 6 (six) hours as needed for pain or fever.   Yes Historical Provider, MD  aspirin EC 81 MG tablet Take 81 mg by mouth daily.   Yes Historical Provider, MD  b complex-vitamin c-folic acid (NEPHRO-VITE) 0.8 MG TABS Take 0.8 mg by mouth daily.    Yes Historical Provider, MD  calcium acetate (PHOSLO) 667 MG capsule Take 2,001 mg by mouth 3 (three) times daily with meals.   Yes Historical Provider, MD  citalopram (CELEXA) 20 MG tablet Take 20 mg by mouth daily.   Yes Historical Provider, MD  docusate sodium (COLACE) 100 MG capsule Take 400 mg by mouth 2 (two) times daily.   Yes  Historical Provider, MD  folic acid (FOLVITE) 1 MG tablet Take 1 mg by mouth daily.   Yes Historical Provider, MD  gabapentin (NEURONTIN) 300 MG capsule Take 300 mg by mouth every hemodialysis. Take on Monday, Wednesday, and Friday   Yes Historical Provider, MD  lanthanum (FOSRENOL) 1000 MG chewable tablet Chew 2,000 mg by mouth 3 (three) times daily. Takes with meals and snacks   Yes Historical Provider, MD  metoprolol (LOPRESSOR) 50 MG tablet Take 50 mg by mouth 2 (two) times daily. 25mg  twice daily on dialysis days (M,W,F) Non-diaylsis days: 50mg  in the morning 25mg  in the evening   Yes Historical Provider, MD  omeprazole (PRILOSEC OTC) 20 MG tablet Take 20 mg by mouth 2 (two) times daily.    Yes Historical Provider, MD  Probiotic Product (PROBIOTIC PO) Take 1 capsule by mouth 2 (two) times daily.   Yes Historical Provider, MD  simvastatin (ZOCOR) 20 MG tablet Take 10 mg by mouth daily.   Yes Historical Provider, MD    Current Facility-Administered Medications  Medication Dose Route Frequency Provider Last Rate Last Dose  . 0.9 %  sodium chloride infusion  250 mL Intravenous PRN Richarda Overlie, MD      . acetaminophen (TYLENOL) tablet 650 mg  650 mg Oral Q6H PRN Richarda Overlie,  MD       Or  . acetaminophen (TYLENOL) suppository 650 mg  650 mg Rectal Q6H PRN Richarda Overlie, MD      . aspirin EC tablet 81 mg  81 mg Oral Daily Richarda Overlie, MD   81 mg at 12/04/12 1003  . calcium acetate (PHOSLO) capsule 2,001 mg  2,001 mg Oral TID WC Richarda Overlie, MD   2,001 mg at 12/04/12 1647  . citalopram (CELEXA) tablet 20 mg  20 mg Oral Daily Richarda Overlie, MD      . docusate sodium (COLACE) capsule 400 mg  400 mg Oral BID Richarda Overlie, MD   400 mg at 12/04/12 1002  . enoxaparin (LOVENOX) injection 30 mg  30 mg Subcutaneous Q24H Richarda Overlie, MD   30 mg at 12/04/12 1647  . folic acid (FOLVITE) tablet 1 mg  1 mg Oral Daily Richarda Overlie, MD   1 mg at 12/04/12 1003  . gabapentin (NEURONTIN) capsule 300 mg  300 mg  Oral Q M,W,F-2000 Richarda Overlie, MD      . levalbuterol (XOPENEX) nebulizer solution 0.63 mg  0.63 mg Nebulization Q6H WA Meredeth Ide, MD      . Melene Muller ON 12/05/2012] metoprolol (LOPRESSOR) tablet 50 mg  50 mg Oral Custom Richarda Overlie, MD      . metoprolol tartrate (LOPRESSOR) tablet 25 mg  25 mg Oral Custom Richarda Overlie, MD   25 mg at 12/04/12 1003  . [START ON 12/05/2012] metoprolol tartrate (LOPRESSOR) tablet 25 mg  25 mg Oral Custom Richarda Overlie, MD      . omeprazole (PRILOSEC) capsule 20 mg  20 mg Oral BID Richarda Overlie, MD   20 mg at 12/04/12 1003  . ondansetron (ZOFRAN) tablet 4 mg  4 mg Oral Q6H PRN Richarda Overlie, MD       Or  . ondansetron (ZOFRAN) injection 4 mg  4 mg Intravenous Q6H PRN Richarda Overlie, MD      . simvastatin (ZOCOR) tablet 10 mg  10 mg Oral q1800 Richarda Overlie, MD   10 mg at 12/04/12 1647  . sodium chloride 0.9 % injection 3 mL  3 mL Intravenous Q12H Nayana Abrol, MD      . sodium chloride 0.9 % injection 3 mL  3 mL Intravenous Q12H Richarda Overlie, MD   3 mL at 12/04/12 0828  . sodium chloride 0.9 % injection 3 mL  3 mL Intravenous PRN Richarda Overlie, MD        Allergies as of 12/04/2012 - Review Complete 12/04/2012  Allergen Reaction Noted  . Diphenhydramine Itching 12/04/2012  . Penicillins Other (See Comments) 02/12/2012  . Adhesive (tape) Rash 02/12/2012    No family history on file.  History   Social History  . Marital Status: Married    Spouse Name: N/A    Number of Children: N/A  . Years of Education: N/A   Occupational History  . Not on file.   Social History Main Topics  . Smoking status: Former Smoker -- 2.00 packs/day for 20 years    Types: Cigarettes    Quit date: 08/06/1998  . Smokeless tobacco: Not on file  . Alcohol Use: No  . Drug Use: No  . Sexually Active:    Other Topics Concern  . Not on file   Social History Narrative  . No narrative on file    Review of Systems: Gen: Denies any fever, chills, sweats, anorexia, fatigue, weakness,  malaise, weight loss, and sleep disorder HEENT:  No visual complaints, No history of Retinopathy. Normal external appearance No Epistaxis or Sore throat. No sinusitis.   CV: complained of onset of chest pain while laying in bed  Resp: Denies dyspnea at rest, dyspnea with exercise, cough, sputum, wheezing, coughing up blood, and pleurisy. GI: Denies vomiting blood, jaundice, and fecal incontinence.   Denies dysphagia or odynophagia. GU : Denies urinary burning, blood in urine, urinary frequency, urinary hesitancy, nocturnal urination, and urinary incontinence.  No renal calculi. MS: Denies joint pain, limitation of movement, and swelling, stiffness, low back pain, extremity pain. Denies muscle weakness, cramps, atrophy.  No use of non steroidal antiinflammatory drugs. Derm: Denies rash, itching, dry skin, hives, moles, warts, or unhealing ulcers.  Psych: Denies depression, anxiety, memory loss, suicidal ideation, hallucinations, paranoia, and confusion. Heme: Denies bruising, bleeding, and enlarged lymph nodes. Neuro: No headache.  No diplopia. No dysarthria.  No dysphasia.  No history of CVA.  No Seizures. No paresthesias.  No weakness. Endocrine No DM.  No Thyroid disease.  No Adrenal disease.  Physical Exam: Vital signs in last 24 hours: Temp:  [97.3 F (36.3 C)-98 F (36.7 C)] 97.6 F (36.4 C) (03/31 1355) Pulse Rate:  [57-72] 63 (03/31 1355) Resp:  [18-25] 18 (03/31 1355) BP: (131-178)/(37-64) 149/55 mmHg (03/31 1355) SpO2:  [88 %-98 %] 98 % (03/31 1415) Weight:  [83 kg (182 lb 15.7 oz)] 83 kg (182 lb 15.7 oz) (03/31 0630) Last BM Date: 12/03/12 General:   Alert,  Well-developed, well-nourished, pleasant and cooperative in NAD Head:  Normocephalic and atraumatic. Eyes:  Sclera clear, no icterus.   Conjunctiva pink. Ears:  Normal auditory acuity. Nose:  No deformity, discharge,  or lesions. Mouth:  No deformity or lesions, dentition normal. Neck:  Supple; no masses or thyromegaly.  JVP not elevated Lungs:  Clear throughout to auscultation.   No wheezes, crackles, or rhonchi. No acute distress. Heart:  Regular rate and rhythm; no murmurs, clicks, rubs,  or gallops. Abdomen:  Soft, nontender and nondistended. No masses, hepatosplenomegaly or hernias noted. Normal bowel sounds, without guarding, and without rebound.   Msk:  Symmetrical without gross deformities. Normal posture. Pulses:  No carotid, renal, femoral bruits. DP and PT symmetrical and equal Extremities:  Without clubbing or edema. Neurologic:  Alert and  oriented x4;  grossly normal neurologically. Skin:  Intact without significant lesions or rashes. Cervical Nodes:  No significant cervical adenopathy. Psych:  Alert and cooperative. Normal mood and affect.  Intake/Output from previous day:   Intake/Output this shift: Total I/O In: 480 [P.O.:480] Out: -   Lab Results:  Recent Labs  12/04/12 0048 12/04/12 0058 12/04/12 0839  WBC 9.0  --  10.4  HGB 11.1* 11.6* 10.8*  HCT 33.3* 34.0* 33.0*  PLT 201  --  212   BMET  Recent Labs  12/04/12 0058 12/04/12 0839  NA 141  --   K 5.4*  --   CL 101  --   GLUCOSE 108*  --   BUN 81*  --   CREATININE 9.80* 10.46*  PHOS  --  8.7*   LFT  Recent Labs  12/04/12 0540  PROT 7.7  ALBUMIN 3.2*  AST 21  ALT 15  ALKPHOS 86  BILITOT 0.2*  BILIDIR <0.1  IBILI NOT CALCULATED   PT/INR No results found for this basename: LABPROT, INR,  in the last 72 hours Hepatitis Panel No results found for this basename: HEPBSAG, HCVAB, HEPAIGM, HEPBIGM,  in the last 72 hours  Studies/Results: Dg  Chest 2 View  12/04/2012  *RADIOLOGY REPORT*  Clinical Data: Acute onset mid chest pain radiating down the left arm.  CHEST - 2 VIEW  Comparison: 11/12/2012  Findings: Cardiac enlargement with mild pulmonary vascular congestion.  Interval progression since previous study with development of interstitial changes suggesting early interstitial edema.  No focal consolidation  in the lungs.  No blunting of costophrenic angles.  No pneumothorax.  Mediastinal contours appear intact.  Calcification of the aorta.  IMPRESSION: Cardiac enlargement with progression of vascular congestion and early developing interstitial edema.   Original Report Authenticated By: Burman Nieves, M.D.     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 Evaluation for management of ESRD. Vitals are stable with no distress and well controlled volume status. Evaluation of out patient records indicates that he receives dialysis MWF at [facility]. He is dialyzed for 4 hrs  on a 180 non-reuse dialyzer with a 2.25 calcium and 2 potassium dialysate and linear sodium 138.He receives erythropoietin and iron to keep his Hb above 10 and IV Vitamin D and phosphate binders to keep his Bone mineral goals at [employer]. He has a functioning AVF with no problems during usage. We shall proceed with in hospital scheduled dialysis   LOS: 0 [name] W @TODAY @6 :47 PM

## 2012-12-04 NOTE — Progress Notes (Signed)
Per Dr. Sharl Ma will call Dr. Hyman Hopes for HD orders; pt to have HD today.

## 2012-12-04 NOTE — Progress Notes (Signed)
Spoke with HD; pt on list, but may not get dialyzed until AM; pt and wife made aware; will cont. To monitor.

## 2012-12-04 NOTE — ED Notes (Signed)
MD at bedside. 

## 2012-12-04 NOTE — ED Notes (Signed)
Admitting MD at bedside.

## 2012-12-04 NOTE — Progress Notes (Signed)
INITIAL NUTRITION ASSESSMENT  DOCUMENTATION CODES Per approved criteria  -Not Applicable   INTERVENTION:  No nutrition intervention at this time ---> patient declined RD to follow for nutrition care plan  NUTRITION DIAGNOSIS: Increased nutrient needs related to ESRD as evidenced by estimated nutrition needs  Goal: Oral intake with meals to meet >/= 90% of estimated nutrition needs  Monitor:  PO intake, weight, labs, I/O's  Reason for Assessment: Malnutrition Screening Tool Report  57 y.o. male  Admitting Dx: Chest pain  ASSESSMENT: Patient with hx of ESRD recently admitted for GPC bacteremia and discharged on 3/13; presented to the ER with episodic central chest pain radiating upwards into the joint bilateral shoulders.  Patient reports he's eating well, however, PO intake variable at 25-50% per flowsheet records; he denies any recent weight loss; would benefit from addition of nutrition supplement, however, patient declined at this time.  Height: Ht Readings from Last 1 Encounters:  12/04/12 6' (1.829 m)    Weight: Wt Readings from Last 1 Encounters:  12/04/12 182 lb 15.7 oz (83 kg)    Ideal Body Weight: 178 lb  % Ideal Body Weight: 102%  Wt Readings from Last 10 Encounters:  12/04/12 182 lb 15.7 oz (83 kg)  11/15/12 169 lb 15.6 oz (77.1 kg)  02/14/12 180 lb 1.9 oz (81.7 kg)    Usual Body Weight: ---  % Usual Body Weight: ---  BMI:  Body mass index is 24.81 kg/(m^2).  Estimated Nutritional Needs: Kcal: 2100-2300 Protein: 110-120 gm Fluid: 1200 ml  Skin: Intact  Diet Order: Cardiac  EDUCATION NEEDS: -No education needs identified at this time   Intake/Output Summary (Last 24 hours) at 12/04/12 1237 Last data filed at 12/04/12 1139  Gross per 24 hour  Intake    240 ml  Output      0 ml  Net    240 ml    Last BM: 3/30  Labs:   Recent Labs Lab 12/04/12 0058 12/04/12 0839  NA 141  --   K 5.4*  --   CL 101  --   BUN 81*  --    CREATININE 9.80* 10.46*  MG  --  2.8*  PHOS  --  8.7*  GLUCOSE 108*  --      Scheduled Meds: . aspirin EC  81 mg Oral Daily  . calcium acetate  2,001 mg Oral TID WC  . citalopram  20 mg Oral Daily  . docusate sodium  400 mg Oral BID  . enoxaparin (LOVENOX) injection  30 mg Subcutaneous Q24H  . folic acid  1 mg Oral Daily  . gabapentin  300 mg Oral Q M,W,F-2000  . levalbuterol  0.63 mg Nebulization Q6H  . [START ON 12/05/2012] metoprolol tartrate  50 mg Oral Custom  . metoprolol  25 mg Oral Custom  . [START ON 12/05/2012] metoprolol tartrate  25 mg Oral Custom  . omeprazole  20 mg Oral BID  . simvastatin  10 mg Oral q1800  . sodium chloride  3 mL Intravenous Q12H  . sodium chloride  3 mL Intravenous Q12H    Continuous Infusions:   Past Medical History  Diagnosis Date  . Multiple sclerosis   . CAD (coronary artery disease)   . Aortic heart valve prolapse   . Peripheral vascular disease   . Sleep apnea     ordered bipap with 3L bled in, but pt will not wear  . Atrial fibrillation   . Multiple sclerosis   . 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  He had a renal Tx from 2008 to 2011.  Gets HD in [place], [place] on MWF schedule.      Past Surgical History  Procedure Laterality Date  . Av fistula placement    . Flash      flash pulmonary edema  . Kidney transplant  08/2007  . Hernia repair  07/2011  . Excised squamous cells at rectum  2006    Maureen Chatters, RD, LDN Pager #: [phone] After-Hours Pager #: [phone]

## 2012-12-04 NOTE — ED Notes (Signed)
Patient presents with intermittent, episodicc/o central chest pain that radiates upwards to jaw and bilateral shoulders. Patient had similar episode early Sunday am, woke him out of sleep and most recently tonight around 1230, while having a bowel movement. Denies SOB, diaphoresis, dizziness, headache, N/V or abdominal pain.

## 2012-12-04 NOTE — Progress Notes (Signed)
Spoke with Dr. Hyman Hopes; pt for HD tonight; pt and wife made aware; will cont. To monitor.

## 2012-12-04 NOTE — Progress Notes (Signed)
Pt concerned that he has not had HD today; his normal HD days are MWF; MD paged to make aware; will cont. To monitor.

## 2012-12-04 NOTE — ED Notes (Signed)
Patient transported to X-ray 

## 2012-12-04 NOTE — Progress Notes (Signed)
  Echocardiogram 2D Echocardiogram has been performed.  [name] 12/04/2012, 4:11 PM

## 2012-12-04 NOTE — ED Notes (Addendum)
C/o CP, occurred 0300 in the morning, woke me up. Comes and goes. Occurred again tonight after having BM. Denies other sx. Takes metoprolol for HR. Took ASA 81mg  & nightly meds PTA. Has HD MWF in [place], access site L wrist fistula (+ bruit/thrill). Denies pain at this time. Family present.

## 2012-12-05 DIAGNOSIS — R079 Chest pain, unspecified: Secondary | ICD-10-CM

## 2012-12-05 DIAGNOSIS — N189 Chronic kidney disease, unspecified: Secondary | ICD-10-CM

## 2012-12-05 LAB — COMPREHENSIVE METABOLIC PANEL
ALT: 11 U/L (ref 0–53)
AST: 12 U/L (ref 0–37)
Alkaline Phosphatase: 68 U/L (ref 39–117)
CO2: 21 mEq/L (ref 19–32)
Chloride: 98 mEq/L (ref 96–112)
Creatinine, Ser: 11.92 mg/dL — ABNORMAL HIGH (ref 0.50–1.35)
GFR calc non Af Amer: 4 mL/min — ABNORMAL LOW (ref >90)
Sodium: 140 mEq/L (ref 135–145)
Total Bilirubin: 0.2 mg/dL — ABNORMAL LOW (ref 0.3–1.2)

## 2012-12-05 LAB — CBC
MCV: 96.9 fL (ref 78.0–100.0)
Platelets: 186 10*3/uL (ref 150–400)
RBC: 3.23 MIL/uL — ABNORMAL LOW (ref 4.22–5.81)
WBC: 10.8 10*3/uL — ABNORMAL HIGH (ref 4.0–10.5)

## 2012-12-05 MED ORDER — LIDOCAINE HCL (PF) 1 % IJ SOLN: 5.0000 mL | INTRAMUSCULAR | Status: DC | PRN

## 2012-12-05 MED ORDER — LIDOCAINE-PRILOCAINE 2.5-2.5 % EX CREA
1.0000 "application " | TOPICAL_CREAM | CUTANEOUS | Status: DC | PRN
  Filled 2012-12-05: qty 5

## 2012-12-05 MED ORDER — NEPRO/CARBSTEADY PO LIQD
237.0000 mL | ORAL | Status: DC | PRN
  Filled 2012-12-05: qty 237

## 2012-12-05 MED ORDER — SODIUM CHLORIDE 0.9 % IV SOLN: 100.0000 mL | INTRAVENOUS | Status: DC | PRN

## 2012-12-05 MED ORDER — HEPARIN SODIUM (PORCINE) 1000 UNIT/ML DIALYSIS
1000.0000 [IU] | INTRAMUSCULAR | Status: DC | PRN
  Filled 2012-12-05: qty 1

## 2012-12-05 MED ORDER — ALTEPLASE 2 MG IJ SOLR: 2.0000 mg | Freq: Once | INTRAMUSCULAR | Status: DC | PRN

## 2012-12-05 MED ORDER — PENTAFLUOROPROP-TETRAFLUOROETH EX AERO: 1.0000 "application " | INHALATION_SPRAY | CUTANEOUS | Status: DC | PRN

## 2012-12-05 NOTE — Progress Notes (Signed)
Pt and wife given d/c instructions; both verbalized understanding; IV and tele monitor d/c at this time; will cont. To monitor. 

## 2012-12-05 NOTE — Progress Notes (Signed)
Pt in HD at this time.

## 2012-12-05 NOTE — Procedures (Signed)
Pt seen on HD.  K 7.1 so switched to 1K bath. Ap 170  Vp 210.  SBP 131. Tolerating HD well.

## 2012-12-05 NOTE — Discharge Summary (Signed)
Physician Discharge Summary  [name] ZOX:[id number] DOB: 06-11-1956 DOA: 12/04/2012  PCP: Dina Rich, MD  Admit date: 12/04/2012 Discharge date: 12/05/2012  Time spent: 50* minutes  Recommendations for Outpatient Follow-up:  1. Follow up Cardiology at [facility] 2. Follow up nephrology for hemodialysis 3. Dr Hyman Hopes can follow the blood cultures results   Discharge Diagnoses:  Principal Problem:   Chest pain Active Problems:   ESRD on hemodialysis   Thrombocytopenia   Aortic valve disease   Renal transplant failure and rejection   GPC Bacteremia   Discharge Condition: Stable  Diet recommendation: low salt diet  Filed Weights   12/04/12 0630 12/05/12 0457 12/05/12 0635  Weight: 83 kg (182 lb 15.7 oz) 83.8 kg (184 lb 11.9 oz) 84.4 kg (186 lb 1.1 oz)    History of present illness:  57 year old male with esrd dialysis m/w/f on diaylsis over 10 years, since 2000. had membranous glomerapnephropathy s/p transfplant then subsequently , status post transplant rejection, recently admitted for [facility] bacteremia and discharged on 3/13, who presents to the ER with episodic central chest pain radiating upwards into the joint bilateral shoulders.Marland Kitchen He recently completed a two-week course of vancomycin.  He has had chest pains over the course of the last 2 years with workups that have shown coronary artery disease, but has been medically managed at this point. Patient follows with Dr. Serita Butcher who is the patient's cardiologist at [facility]. Patient was told that he had problems with his valves and some narrowing in his arteries, and that this would all be taken care of at the same time with surgery, but for the time being will be medically managed. Yesterday, patient had onset of chest pain while laying in bed and asleep at 03 100, is described as an aching pain in the center of the chest it radiates to bilateral shoulders and up to the jaw, is not associated diaphoresis, shortness of  breath or nausea or vomiting. The patient's pain is typical, it is been chronic, it lasted for 30 minutes to an hour. He did not take anything for it yesterday. This occurred twice during the early morning hours yesterday and then occurred again today starting between 11 and 12:00 PM.    Hospital Course:   Chest pain Patient was admitted with chest pain, and had mild elevation of troponin to 0.65, cardiology was consulted. Dr Donnie Aho saw the patient and offered no new intervention, as it was consistent with unstable angina due to volume overload. He received dialyses in the hospital, and at this time the pain has resolved. Follow up cardiology at [facility]  ESRD Continue with hemodialysis  Recent GPC  bacteremia Patient had bacteremia three weeks ago, which was treated with vancomycin, and we obtained blood cultures in the hospital , the blood cultures need to be followed up as outpatient. Dr Hyman Hopes to follow the cultures.  Procedures:  Hemodialysis   Consultations:  Cardiology  Nephrology   Discharge Exam: Filed Vitals:   12/05/12 1014 12/05/12 1030 12/05/12 1051 12/05/12 1400  BP: 153/57 141/56 133/38 132/45  Pulse: 72 69 66 66  Temp:  97.9 F (36.6 C) 98 F (36.7 C) 98.3 F (36.8 C)  TempSrc:  Oral Oral Oral  Resp: 18  18 18   Height:      Weight:      SpO2: 94%  94% 95%    General: Appear in no acute distress Cardiovascular: s1s2 RRR* Respiratory: Clear bilaterally Ext: No edema  Discharge Instructions  Discharge Orders   Future Orders Complete By Expires     Diet - low sodium heart healthy  As directed     Increase activity slowly  As directed         Medication List    TAKE these medications       acetaminophen 500 MG tablet  Commonly known as:  TYLENOL  Take 1,000 mg by mouth every 6 (six) hours as needed for pain or fever.     aspirin EC 81 MG tablet  Take 81 mg by mouth daily.     b complex-vitamin c-folic acid 0.8 MG Tabs  Take 0.8 mg by  mouth daily.     calcium acetate 667 MG capsule  Commonly known as:  PHOSLO  Take 2,001 mg by mouth 3 (three) times daily with meals.     citalopram 20 MG tablet  Commonly known as:  CELEXA  Take 20 mg by mouth daily.     docusate sodium 100 MG capsule  Commonly known as:  COLACE  Take 400 mg by mouth 2 (two) times daily.     folic acid 1 MG tablet  Commonly known as:  FOLVITE  Take 1 mg by mouth daily.     gabapentin 300 MG capsule  Commonly known as:  NEURONTIN  Take 300 mg by mouth every hemodialysis. Take on Monday, Wednesday, and Friday     lanthanum 1000 MG chewable tablet  Commonly known as:  FOSRENOL  Chew 2,000 mg by mouth 3 (three) times daily. Takes with meals and snacks     metoprolol 50 MG tablet  Commonly known as:  LOPRESSOR  Take 50 mg by mouth 2 (two) times daily. 25mg  twice daily on dialysis days (M,W,F)  Non-diaylsis days:  50mg  in the morning 25mg  in the evening     omeprazole 20 MG tablet  Commonly known as:  PRILOSEC OTC  Take 20 mg by mouth 2 (two) times daily.     PROBIOTIC PO  Take 1 capsule by mouth 2 (two) times daily.     simvastatin 20 MG tablet  Commonly known as:  ZOCOR  Take 10 mg by mouth daily.          The results of significant diagnostics from this hospitalization (including imaging, microbiology, ancillary and laboratory) are listed below for reference.    Significant Diagnostic Studies: Dg Chest 2 View  12/04/2012  *RADIOLOGY REPORT*  Clinical Data: Acute onset mid chest pain radiating down the left arm.  CHEST - 2 VIEW  Comparison: 11/12/2012  Findings: Cardiac enlargement with mild pulmonary vascular congestion.  Interval progression since previous study with development of interstitial changes suggesting early interstitial edema.  No focal consolidation in the lungs.  No blunting of costophrenic angles.  No pneumothorax.  Mediastinal contours appear intact.  Calcification of the aorta.  IMPRESSION: Cardiac enlargement  with progression of vascular congestion and early developing interstitial edema.   Original Report Authenticated By: Burman Nieves, M.D.    Dg Chest 2 View  11/12/2012  *RADIOLOGY REPORT*  Clinical Data: Fever, cough, congestion, hypertension, atrial fibrillation, end-stage renal disease on dialysis  CHEST - 2 VIEW  Comparison: 02/12/2012  Findings: Enlargement of cardiac silhouette with pulmonary vascular congestion. Calcified tortuous aorta. Lungs clear. No pleural effusion or pneumothorax. Bones unremarkable.  IMPRESSION: Enlargement of cardiac silhouette with pulmonary vascular congestion. No definite acute infiltrate.   Original Report Authenticated By: Ulyses Southward, M.D.     Microbiology: No results found for this  or any previous visit (from the past 240 hour(s)).   Labs: Basic Metabolic Panel:  Recent Labs Lab 12/04/12 0058 12/04/12 0839 12/05/12 0440 12/05/12 1240  NA 141  --  140  --   K 5.4*  --  7.1* 4.5  CL 101  --  98  --   CO2  --   --  21  --   GLUCOSE 108*  --  89  --   BUN 81*  --  106*  --   CREATININE 9.80* 10.46* 11.92*  --   CALCIUM  --   --  8.9  --   MG  --  2.8*  --   --   PHOS  --  8.7*  --   --    Liver Function Tests:  Recent Labs Lab 12/04/12 0540 12/05/12 0440  AST 21 12  ALT 15 11  ALKPHOS 86 68  BILITOT 0.2* 0.2*  PROT 7.7 7.1  ALBUMIN 3.2* 2.9*   CBC:  Recent Labs Lab 12/04/12 0048 12/04/12 0058 12/04/12 0839 12/05/12 0440  WBC 9.0  --  10.4 10.8*  NEUTROABS 7.1  --   --   --   HGB 11.1* 11.6* 10.8* 10.2*  HCT 33.3* 34.0* 33.0* 31.3*  MCV 96.0  --  98.8 96.9  PLT 201  --  212 186   Cardiac Enzymes:  Recent Labs Lab 12/04/12 0835 12/04/12 1143 12/04/12 1910  TROPONINI 0.65* 0.65* 0.55*   BNP: BNP (last 3 results) No results found for this basename: PROBNP,  in the last 8760 hours CBG: No results found for this basename: GLUCAP,  in the last 168 hours   Signed:  Jelisha Weed S  [facility] 12/05/2012, 3:27  PM

## 2012-12-05 NOTE — Progress Notes (Signed)
Subjective:  Currently on dialysis, no recurrent chest pain of SOB.  Objective:  Vital Signs in the last 24 hours: BP 136/50  Pulse 68  Temp(Src) 97.7 F (36.5 C) (Oral)  Resp 20  Ht 6' (1.829 m)  Wt 84.4 kg (186 lb 1.1 oz)  BMI 25.23 kg/m2  SpO2 91%  Physical Exam:  Lungs:  Clear to A&P Cardiac:  Regular rhythm, normal S1 and S2, no S3, 2/6 systolic murmur, 2/6 diastolic murmur Extremities:  No edema present  Intake/Output from previous day: 03/31 0701 - 04/01 0700 In: 720 [P.O.:720] Out: -   Weight Filed Weights   12/04/12 0630 12/05/12 0457 12/05/12 0635  Weight: 83 kg (182 lb 15.7 oz) 83.8 kg (184 lb 11.9 oz) 84.4 kg (186 lb 1.1 oz)    Lab Results: Basic Metabolic Panel:  Recent Labs  21/30/86 0058 12/04/12 0839 12/05/12 0440  NA 141  --  140  K 5.4*  --  7.1*  CL 101  --  98  CO2  --   --  21  GLUCOSE 108*  --  89  BUN 81*  --  106*  CREATININE 9.80* 10.46* 11.92*   CBC:  Recent Labs  12/04/12 0048  12/04/12 0839 12/05/12 0440  WBC 9.0  --  10.4 10.8*  NEUTROABS 7.1  --   --   --   HGB 11.1*  < > 10.8* 10.2*  HCT 33.3*  < > 33.0* 31.3*  MCV 96.0  --  98.8 96.9  PLT 201  --  212 186  < > = values in this interval not displayed. Cardiac Enzymes:  Recent Labs  12/04/12 0835 12/04/12 1143 12/04/12 1910  TROPONINI 0.65* 0.65* 0.55*    Telemetry: Sinus rhythm  Assessment/Plan: 1. Chest pain with abnormal troponin resolved - c/w unstable angina, could also have been due to volume overload. 2. Recent gram + bacteremia 3. History of a fib  Rec:  Awaiting records from [facility].  I don't think he will need further inpatient w/u here and could prob be d/c home to f/u with his cardiologist at [facility] soon.  I would check a couple of blood cultures to be sure that he has cleared his bacteremia.      Darden Palmer  MD [facility]  12/05/2012, 8:39 AM

## 2012-12-05 NOTE — Progress Notes (Signed)
CRITICAL VALUE ALERT  Critical value received: K=7.1  Date of notification:  12/05/2012  Time of notification: 0640  Critical value read back:yes  Nurse who received alert: Roosvelt Harps RN  MD notified (1st page):  Hyman Hopes  Time of first page:  [phone]  MD notified (2nd page):  Time of second page:  Responding MD: Hyman Hopes  Time MD responded:  [phone]  Order to change HD orders to run on 1K bath for entire time. Done

## 2012-12-05 NOTE — Progress Notes (Signed)
Obtain patient's consent to obtain medical records from [facility] for Dr. Donnie Aho.  Faxed document to Medical Records at [facility]  Fax confirmation and consent placed in patient chart. Will continue to monitor.

## 2012-12-08 LAB — CULTURE, BLOOD (ROUTINE X 2)

## 2012-12-11 LAB — CULTURE, BLOOD (ROUTINE X 2): Culture: NO GROWTH

## 2012-12-15 DIAGNOSIS — I359 Nonrheumatic aortic valve disorder, unspecified: Secondary | ICD-10-CM | POA: Insufficient documentation

## 2013-01-08 ENCOUNTER — Inpatient Hospital Stay (HOSPITAL_COMMUNITY)
Admission: EM | Admit: 2013-01-08 | Discharge: 2013-01-11 | DRG: 280 | Disposition: A | Discharge status: Home / Self Care | Payer: Medicare Other | Attending: Internal Medicine | Admitting: Internal Medicine

## 2013-01-08 DIAGNOSIS — G35 Multiple sclerosis: Secondary | ICD-10-CM

## 2013-01-08 DIAGNOSIS — Z87891 Personal history of nicotine dependence: Secondary | ICD-10-CM

## 2013-01-08 DIAGNOSIS — D631 Anemia in chronic kidney disease: Secondary | ICD-10-CM | POA: Diagnosis present

## 2013-01-08 DIAGNOSIS — G4733 Obstructive sleep apnea (adult) (pediatric): Secondary | ICD-10-CM

## 2013-01-08 DIAGNOSIS — R55 Syncope and collapse: Secondary | ICD-10-CM

## 2013-01-08 DIAGNOSIS — E875 Hyperkalemia: Secondary | ICD-10-CM | POA: Diagnosis present

## 2013-01-08 DIAGNOSIS — N186 End stage renal disease: Secondary | ICD-10-CM

## 2013-01-08 DIAGNOSIS — I10 Essential (primary) hypertension: Secondary | ICD-10-CM

## 2013-01-08 DIAGNOSIS — D696 Thrombocytopenia, unspecified: Secondary | ICD-10-CM

## 2013-01-08 DIAGNOSIS — I4891 Unspecified atrial fibrillation: Secondary | ICD-10-CM

## 2013-01-08 DIAGNOSIS — T8611 Kidney transplant rejection: Secondary | ICD-10-CM

## 2013-01-08 DIAGNOSIS — R079 Chest pain, unspecified: Secondary | ICD-10-CM

## 2013-01-08 DIAGNOSIS — N19 Unspecified kidney failure: Secondary | ICD-10-CM | POA: Diagnosis present

## 2013-01-08 DIAGNOSIS — I359 Nonrheumatic aortic valve disorder, unspecified: Secondary | ICD-10-CM

## 2013-01-08 DIAGNOSIS — R197 Diarrhea, unspecified: Secondary | ICD-10-CM

## 2013-01-08 DIAGNOSIS — R509 Fever, unspecified: Secondary | ICD-10-CM

## 2013-01-08 DIAGNOSIS — I214 Non-ST elevation (NSTEMI) myocardial infarction: Principal | ICD-10-CM

## 2013-01-08 DIAGNOSIS — I7389 Other specified peripheral vascular diseases: Secondary | ICD-10-CM | POA: Diagnosis present

## 2013-01-08 DIAGNOSIS — I351 Nonrheumatic aortic (valve) insufficiency: Secondary | ICD-10-CM | POA: Diagnosis present

## 2013-01-08 DIAGNOSIS — R7881 Bacteremia: Secondary | ICD-10-CM

## 2013-01-08 DIAGNOSIS — I120 Hypertensive chronic kidney disease with stage 5 chronic kidney disease or end stage renal disease: Secondary | ICD-10-CM | POA: Diagnosis present

## 2013-01-08 DIAGNOSIS — T8612 Kidney transplant failure: Secondary | ICD-10-CM | POA: Diagnosis present

## 2013-01-08 DIAGNOSIS — A0472 Enterocolitis due to Clostridium difficile, not specified as recurrent: Secondary | ICD-10-CM

## 2013-01-08 DIAGNOSIS — N2581 Secondary hyperparathyroidism of renal origin: Secondary | ICD-10-CM | POA: Diagnosis present

## 2013-01-08 DIAGNOSIS — Z992 Dependence on renal dialysis: Secondary | ICD-10-CM

## 2013-01-08 DIAGNOSIS — I2510 Atherosclerotic heart disease of native coronary artery without angina pectoris: Secondary | ICD-10-CM | POA: Diagnosis present

## 2013-01-08 DIAGNOSIS — I959 Hypotension, unspecified: Secondary | ICD-10-CM

## 2013-01-08 DIAGNOSIS — T8610 Unspecified complication of kidney transplant: Secondary | ICD-10-CM | POA: Diagnosis present

## 2013-01-09 ENCOUNTER — Emergency Department (HOSPITAL_COMMUNITY): Payer: Medicare Other

## 2013-01-09 ENCOUNTER — Encounter (HOSPITAL_COMMUNITY)
Admission: EM | Disposition: A | Discharge status: Home / Self Care | Payer: Self-pay | Source: Home / Self Care | Attending: Internal Medicine

## 2013-01-09 ENCOUNTER — Encounter (HOSPITAL_COMMUNITY): Payer: Self-pay | Admitting: Internal Medicine

## 2013-01-09 DIAGNOSIS — I214 Non-ST elevation (NSTEMI) myocardial infarction: Secondary | ICD-10-CM | POA: Diagnosis present

## 2013-01-09 DIAGNOSIS — I2510 Atherosclerotic heart disease of native coronary artery without angina pectoris: Secondary | ICD-10-CM

## 2013-01-09 DIAGNOSIS — I4891 Unspecified atrial fibrillation: Secondary | ICD-10-CM

## 2013-01-09 DIAGNOSIS — I359 Nonrheumatic aortic valve disorder, unspecified: Secondary | ICD-10-CM

## 2013-01-09 DIAGNOSIS — G4733 Obstructive sleep apnea (adult) (pediatric): Secondary | ICD-10-CM | POA: Diagnosis present

## 2013-01-09 DIAGNOSIS — I219 Acute myocardial infarction, unspecified: Secondary | ICD-10-CM

## 2013-01-09 HISTORY — PX: LEFT HEART CATHETERIZATION WITH CORONARY ANGIOGRAM: SHX5451

## 2013-01-09 LAB — POCT I-STAT, CHEM 8
BUN: 55 mg/dL — ABNORMAL HIGH (ref 6–23)
Calcium, Ion: 1.03 mmol/L — ABNORMAL LOW (ref 1.12–1.23)
Creatinine, Ser: 6.2 mg/dL — ABNORMAL HIGH (ref 0.50–1.35)
TCO2: 30 mmol/L (ref 0–100)

## 2013-01-09 LAB — BASIC METABOLIC PANEL
BUN: 59 mg/dL — ABNORMAL HIGH (ref 6–23)
CO2: 25 mEq/L (ref 19–32)
Calcium: 9.2 mg/dL (ref 8.4–10.5)
Creatinine, Ser: 7.14 mg/dL — ABNORMAL HIGH (ref 0.50–1.35)
Glucose, Bld: 97 mg/dL (ref 70–99)

## 2013-01-09 LAB — POCT ACTIVATED CLOTTING TIME
Activated Clotting Time: 115 seconds
Activated Clotting Time: 383 seconds

## 2013-01-09 LAB — CBC
HCT: 39.2 % (ref 39.0–52.0)
MCH: 33.1 pg (ref 26.0–34.0)
MCHC: 34.1 g/dL (ref 30.0–36.0)
MCV: 97.3 fL (ref 78.0–100.0)
MCV: 96.9 fL (ref 78.0–100.0)
Platelets: 155 10*3/uL (ref 150–400)
RBC: 4.03 MIL/uL — ABNORMAL LOW (ref 4.22–5.81)
RDW: 15.9 % — ABNORMAL HIGH (ref 11.5–15.5)
WBC: 10.3 10*3/uL (ref 4.0–10.5)

## 2013-01-09 LAB — HEPATIC FUNCTION PANEL
ALT: 15 U/L (ref 0–53)
AST: 25 U/L (ref 0–37)
Albumin: 3.6 g/dL (ref 3.5–5.2)
Alkaline Phosphatase: 87 U/L (ref 39–117)
Total Protein: 8.2 g/dL (ref 6.0–8.3)

## 2013-01-09 LAB — POCT I-STAT TROPONIN I: Troponin i, poc: 0.47 ng/mL (ref 0.00–0.08)

## 2013-01-09 SURGERY — LEFT HEART CATHETERIZATION WITH CORONARY ANGIOGRAM
Anesthesia: LOCAL

## 2013-01-09 MED ORDER — HYDROMORPHONE HCL PF 1 MG/ML IJ SOLN: 0.5000 mg | INTRAMUSCULAR | Status: AC | PRN

## 2013-01-09 MED ORDER — METOPROLOL TARTRATE 25 MG PO TABS
25.0000 mg | ORAL_TABLET | ORAL | Status: DC
  Administered 2013-01-09: 25 mg via ORAL
  Filled 2013-01-09: qty 1

## 2013-01-09 MED ORDER — HEPARIN BOLUS VIA INFUSION
4000.0000 [IU] | Freq: Once | INTRAVENOUS | Status: AC
  Administered 2013-01-09: 4000 [IU] via INTRAVENOUS

## 2013-01-09 MED ORDER — CITALOPRAM HYDROBROMIDE 20 MG PO TABS
20.0000 mg | ORAL_TABLET | Freq: Every day | ORAL | Status: DC
  Administered 2013-01-09 – 2013-01-11 (×3): 20 mg via ORAL
  Filled 2013-01-09 (×3): qty 1

## 2013-01-09 MED ORDER — NITROGLYCERIN 2 % TD OINT
0.5000 [in_us] | TOPICAL_OINTMENT | Freq: Four times a day (QID) | TRANSDERMAL | Status: DC
  Administered 2013-01-09 (×2): 0.5 [in_us] via TOPICAL
  Filled 2013-01-09: qty 30
  Filled 2013-01-09: qty 1

## 2013-01-09 MED ORDER — SIMVASTATIN 10 MG PO TABS
10.0000 mg | ORAL_TABLET | Freq: Two times a day (BID) | ORAL | Status: DC
Start: 2013-01-09 — End: 2013-01-11
  Administered 2013-01-09 – 2013-01-11 (×5): 10 mg via ORAL
  Filled 2013-01-09 (×6): qty 1

## 2013-01-09 MED ORDER — SODIUM CHLORIDE 0.9 % IJ SOLN
3.0000 mL | Freq: Two times a day (BID) | INTRAMUSCULAR | Status: DC
  Administered 2013-01-10: 3 mL via INTRAVENOUS

## 2013-01-09 MED ORDER — ASPIRIN 81 MG PO CHEW
324.0000 mg | CHEWABLE_TABLET | Freq: Once | ORAL | Status: AC
  Administered 2013-01-09: 324 mg via ORAL
  Filled 2013-01-09: qty 4

## 2013-01-09 MED ORDER — PNEUMOCOCCAL VAC POLYVALENT 25 MCG/0.5ML IJ INJ
0.5000 mL | INJECTION | INTRAMUSCULAR | Status: AC
  Administered 2013-01-10: 0.5 mL via INTRAMUSCULAR
  Filled 2013-01-09: qty 0.5

## 2013-01-09 MED ORDER — CLOPIDOGREL BISULFATE 75 MG PO TABS
75.0000 mg | ORAL_TABLET | Freq: Every day | ORAL | Status: DC
  Administered 2013-01-10 – 2013-01-11 (×2): 75 mg via ORAL
  Filled 2013-01-09 (×2): qty 1

## 2013-01-09 MED ORDER — HEPARIN (PORCINE) IN NACL 2-0.9 UNIT/ML-% IJ SOLN
INTRAMUSCULAR | Status: AC
  Filled 2013-01-09: qty 1000

## 2013-01-09 MED ORDER — METOPROLOL TARTRATE 25 MG PO TABS: 25.0000 mg | ORAL_TABLET | Freq: Two times a day (BID) | ORAL | Status: DC

## 2013-01-09 MED ORDER — DIAZEPAM 5 MG PO TABS
5.0000 mg | ORAL_TABLET | ORAL | Status: AC
  Administered 2013-01-09: 5 mg via ORAL
  Filled 2013-01-09: qty 1

## 2013-01-09 MED ORDER — ASPIRIN EC 325 MG PO TBEC
325.0000 mg | DELAYED_RELEASE_TABLET | Freq: Every day | ORAL | Status: DC
  Administered 2013-01-09: 325 mg via ORAL
  Filled 2013-01-09: qty 1

## 2013-01-09 MED ORDER — HEPARIN (PORCINE) IN NACL 100-0.45 UNIT/ML-% IJ SOLN
1000.0000 [IU]/h | INTRAMUSCULAR | Status: DC
  Administered 2013-01-09: 1000 [IU]/h via INTRAVENOUS
  Filled 2013-01-09: qty 250

## 2013-01-09 MED ORDER — ONDANSETRON HCL 4 MG/2ML IJ SOLN: 4.0000 mg | Freq: Three times a day (TID) | INTRAMUSCULAR | Status: DC | PRN

## 2013-01-09 MED ORDER — ASPIRIN 81 MG PO CHEW: 324.0000 mg | CHEWABLE_TABLET | ORAL | Status: DC

## 2013-01-09 MED ORDER — ACETAMINOPHEN 325 MG PO TABS: 650.0000 mg | ORAL_TABLET | Freq: Four times a day (QID) | ORAL | Status: DC | PRN

## 2013-01-09 MED ORDER — ONDANSETRON HCL 4 MG/2ML IJ SOLN
4.0000 mg | Freq: Once | INTRAMUSCULAR | Status: AC
  Administered 2013-01-09: 4 mg via INTRAVENOUS
  Filled 2013-01-09: qty 2

## 2013-01-09 MED ORDER — CLOPIDOGREL BISULFATE 300 MG PO TABS
ORAL_TABLET | ORAL | Status: AC
  Filled 2013-01-09: qty 2

## 2013-01-09 MED ORDER — SODIUM CHLORIDE 0.9 % IV SOLN
62.5000 mg | INTRAVENOUS | Status: DC
  Administered 2013-01-10: 62.5 mg via INTRAVENOUS
  Filled 2013-01-09 (×2): qty 5

## 2013-01-09 MED ORDER — ONDANSETRON HCL 4 MG/2ML IJ SOLN: 4.0000 mg | Freq: Four times a day (QID) | INTRAMUSCULAR | Status: DC | PRN

## 2013-01-09 MED ORDER — ALBUTEROL SULFATE (5 MG/ML) 0.5% IN NEBU: 2.5000 mg | INHALATION_SOLUTION | RESPIRATORY_TRACT | Status: AC | PRN

## 2013-01-09 MED ORDER — GI COCKTAIL ~~LOC~~
30.0000 mL | Freq: Once | ORAL | Status: AC
  Administered 2013-01-09: 30 mL via ORAL
  Filled 2013-01-09: qty 30

## 2013-01-09 MED ORDER — BIVALIRUDIN 250 MG IV SOLR
INTRAVENOUS | Status: AC
  Filled 2013-01-09: qty 250

## 2013-01-09 MED ORDER — DOCUSATE SODIUM 100 MG PO CAPS
400.0000 mg | ORAL_CAPSULE | Freq: Two times a day (BID) | ORAL | Status: DC
  Administered 2013-01-09 – 2013-01-11 (×5): 400 mg via ORAL
  Filled 2013-01-09 (×6): qty 4

## 2013-01-09 MED ORDER — LANTHANUM CARBONATE 500 MG PO CHEW
2000.0000 mg | CHEWABLE_TABLET | Freq: Three times a day (TID) | ORAL | Status: DC
  Filled 2013-01-09 (×7): qty 4

## 2013-01-09 MED ORDER — METOPROLOL TARTRATE 25 MG PO TABS
25.0000 mg | ORAL_TABLET | ORAL | Status: DC
  Administered 2013-01-10 (×2): 25 mg via ORAL
  Filled 2013-01-09 (×3): qty 1

## 2013-01-09 MED ORDER — SODIUM CHLORIDE 0.9 % IV SOLN
INTRAVENOUS | Status: DC
  Administered 2013-01-09: 10 mL/h via INTRAVENOUS

## 2013-01-09 MED ORDER — HEPARIN (PORCINE) IN NACL 100-0.45 UNIT/ML-% IJ SOLN
1200.0000 [IU]/h | INTRAMUSCULAR | Status: DC
  Filled 2013-01-09: qty 250

## 2013-01-09 MED ORDER — LIDOCAINE HCL (PF) 1 % IJ SOLN
INTRAMUSCULAR | Status: AC
  Filled 2013-01-09: qty 30

## 2013-01-09 MED ORDER — ACETAMINOPHEN 650 MG RE SUPP: 650.0000 mg | Freq: Four times a day (QID) | RECTAL | Status: DC | PRN

## 2013-01-09 MED ORDER — OMEPRAZOLE MAGNESIUM 20 MG PO TBEC: 20.0000 mg | DELAYED_RELEASE_TABLET | Freq: Two times a day (BID) | ORAL | Status: DC

## 2013-01-09 MED ORDER — NITROGLYCERIN 0.4 MG SL SUBL
0.4000 mg | SUBLINGUAL_TABLET | SUBLINGUAL | Status: AC | PRN
  Administered 2013-01-09 (×3): 0.4 mg via SUBLINGUAL
  Filled 2013-01-09: qty 50
  Filled 2013-01-09: qty 25

## 2013-01-09 MED ORDER — GABAPENTIN 300 MG PO CAPS
300.0000 mg | ORAL_CAPSULE | Freq: Every day | ORAL | Status: DC
Start: 2013-01-09 — End: 2013-01-11
  Administered 2013-01-09 – 2013-01-10 (×2): 300 mg via ORAL
  Filled 2013-01-09 (×3): qty 1

## 2013-01-09 MED ORDER — ISOSORBIDE MONONITRATE ER 30 MG PO TB24
30.0000 mg | ORAL_TABLET | Freq: Every day | ORAL | Status: DC
  Administered 2013-01-09 – 2013-01-11 (×3): 30 mg via ORAL
  Filled 2013-01-09 (×3): qty 1

## 2013-01-09 MED ORDER — FOLIC ACID 1 MG PO TABS
1.0000 mg | ORAL_TABLET | Freq: Every day | ORAL | Status: DC
  Administered 2013-01-09 – 2013-01-11 (×3): 1 mg via ORAL
  Filled 2013-01-09 (×3): qty 1

## 2013-01-09 MED ORDER — ASPIRIN 81 MG PO CHEW
81.0000 mg | CHEWABLE_TABLET | Freq: Every day | ORAL | Status: DC
  Administered 2013-01-10 – 2013-01-11 (×2): 81 mg via ORAL
  Filled 2013-01-09 (×2): qty 1

## 2013-01-09 MED ORDER — METOPROLOL TARTRATE 50 MG PO TABS
50.0000 mg | ORAL_TABLET | ORAL | Status: DC
  Administered 2013-01-09 – 2013-01-11 (×2): 50 mg via ORAL
  Filled 2013-01-09 (×2): qty 1

## 2013-01-09 MED ORDER — FAMOTIDINE IN NACL 20-0.9 MG/50ML-% IV SOLN
INTRAVENOUS | Status: AC
  Filled 2013-01-09: qty 50

## 2013-01-09 MED ORDER — PANTOPRAZOLE SODIUM 40 MG PO TBEC
40.0000 mg | DELAYED_RELEASE_TABLET | Freq: Two times a day (BID) | ORAL | Status: DC
  Administered 2013-01-09 – 2013-01-11 (×4): 40 mg via ORAL
  Filled 2013-01-09 (×4): qty 1

## 2013-01-09 MED ORDER — ONDANSETRON HCL 4 MG PO TABS: 4.0000 mg | ORAL_TABLET | Freq: Four times a day (QID) | ORAL | Status: DC | PRN

## 2013-01-09 MED ORDER — CALCIUM ACETATE 667 MG PO CAPS
2001.0000 mg | ORAL_CAPSULE | Freq: Three times a day (TID) | ORAL | Status: DC
  Administered 2013-01-09 – 2013-01-11 (×4): 2001 mg via ORAL
  Filled 2013-01-09 (×10): qty 3

## 2013-01-09 MED ORDER — RENA-VITE PO TABS
1.0000 | ORAL_TABLET | Freq: Every day | ORAL | Status: DC
  Administered 2013-01-09 – 2013-01-11 (×3): 1 via ORAL
  Filled 2013-01-09 (×3): qty 1

## 2013-01-09 NOTE — CV Procedure (Signed)
Cardiac Catheterization Operative Report  [name] [id number] 5/6/20142:08 PM DOUGH,ROBERT, MD  Procedure Performed:  1. Left Heart Catheterization 2. Selective Coronary Angiography 3. Left ventricular angiogram 4. IVUS proximal and mid LAD  Operator: Verne Carrow, MD  Indication:    57 yo male with history of ESRD on HD, failed kidney transplant, CAD, AI, PAD, OSA admitted with NSTEMI/UA.                                    Procedure Details: The risks, benefits, complications, treatment options, and expected outcomes were discussed with the patient. The patient and/or family concurred with the proposed plan, giving informed consent. The patient was brought to the cath lab after IV hydration was begun and oral premedication was given. No additional medications were given for sedation as he was drowsy upon arrival to the cath lab. The right groin was prepped and draped in the usual manner. Using the modified Seldinger access technique, a 5 French sheath was placed in the right femoral artery. There was a considerable amount of tortuosity in the right iliac system. Plain fluoroscopy demonstrated calcification throughout both iliacs. A 24 cm 6 French sheath was then inserted in order to better manipulate the catheters. I was able to engage the left system with a JL5 catheter and was able to visualize all vessels. I then engaged the RCA with a JR4 catheter. Selective angiography was performed. A pigtail catheter was used to perform a left ventricular angiogram.   He was noted to have severe calcification of the proximal and mid LAD with an aneurysmal segment in the proximal LAD. I elected to perform IVUS imaging of the LAD to better define the disease process given some haziness with the diffuse calcification.  The mid LAD was noted to have circumferential calcification. IVUS imaging demonstrated a minimum lumen area of 4.7-5.36mm2. This was not felt to be flow limiting. The proximal LAD  did have an aneurysmal segment but no obvious ulceration.   There were no immediate complications. The patient was taken to the recovery area in stable condition.   Hemodynamic Findings: Central aortic pressure: 117/44 Left ventricular pressure: 129/9/18  Angiographic Findings:  Left main: Short segment. No obstructive disease.   Left Anterior Descending Artery: Large caliber vessel that courses to the apex. The entire proximal and mid vessel is noted to be heavily calcified. The proximal vessel has an aneurysmal segment but no flow limiting disease. There are two diagonal branches. Just after the first diagonal branch, the LAD has diffuse 50-60% stenosis. This area is slightly hazy. IVUS imaging demonstrates a minimum lumen area of 4.7-5.18mm2. The remainder of the LAD is relatively disease free. The first diagonal branch is moderate in caliber with mild plaque. The second diagonal branch is a moderate to large caliber, bifurcating vessel with ostial 60% stenosis followed by an aneurysmal segment.    Circumflex Artery: Large caliber vessel with 50% proximal stenosis. The first two obtuse marginal branches are very small in caliber. The third obtuse marginal branch is large in caliber and has a 60% proximal stenosis.   Right Coronary Artery: Large caliber dominant vessel with eccentric 40% mid stenosis. The PDA and posterolateral branches are moderate in caliber and patent.   Left Ventricular Angiogram: LVEF 55-60%.   Impression: 1. Triple vessel CAD with no focal targets for PCI 2. Moderately severe stenosis mid LAD involving a heavily calcified segment of the  LAD at the bifurcation of the large diagonal branch. IVUS imaging demonstrates that this area is not flow limiting although there is a large plaque burden with circumferential calcium.  3. Moderate disease in the mid RCA and third obtuse marginal branch.  4. Preserved LV systolic function  Recommendations: At this time, would pursue  medical management of his CAD. I do not see a focal target for PCI. His LAD is a heavily calcified vessel and the moderately severe disease is at the bifurcation with the large diagonal branch. This does not appear to be the culprit for his acute event. His LAD does have an aneurysmal segment but there is no ulceration at this area on IVUS imaging. If he has recurrent angina that cannot be treated with medical therapy, will need to consider bypass surgery and also consider replacement of aortic valve at that time (known to have moderate AI by echo 12/04/12). He has been started on Plavix. Continue ASA. Continue beta blocker, statin. Will start long acting nitrate.        Complications:  None. The patient tolerated the procedure well.

## 2013-01-09 NOTE — Progress Notes (Signed)
CRITICAL VALUE ALERT  Critical value received:  Trop 4.4  Date of notification:  01/09/2013  Time of notification:  0555  Critical value read back:yes  Nurse who received alert:  Divante Kotch, [place]   MD notified (1st page):  Dr Toniann Fail  Time of first page:  0558  Time MD responded:  0600  Orders received and carried out

## 2013-01-09 NOTE — ED Notes (Signed)
Pt reports sudden onset of CP while trying to have a BM. Pt reports he then developed SOB, nausea, vomiting, and diaphoresis. Pt reports he tried to take a ASA but vomited it up. Pt is a dialysis pt and was dialyzed today.

## 2013-01-09 NOTE — Progress Notes (Signed)
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 Emogene Morgan at [facility]. He presented to the emergency department because of chest pain which was typical for him regarding his prior ischemic symptoms. This began while he was at home sitting on the commode. The pain was described as retrosternal pressure-like radiating to the neck and was associated with shortness of breath and nausea and vomiting and diaphoresis. In the ER his EKG demonstrated ST depression in the anterior leads and nonspecific ST-T wave changes in the inferior leads. Subsequent troponin bumped to positive and cardiology on call was consulted and recommended internal medicine admission with cardiology following. Patient had already been started on nitroglycerin patch and IV heparin infusion. Patient does endorse that sublingual nitroglycerin did minimize some of the chest discomfort. Patient had recently been admitted by his primary cardiologist last month for chest pain and had just recently followed up with the cardiologist in the past 2 weeks.  Assessment/Plan: Active Problems:   Non-ST elevation MI (NSTEMI) -TNI has increased to >14 -CP free upon our exam earlier this am -Cards following- rec cath if OK with pt's primary cardiologist- plan is to proceed with cath today -cont ASA, Heparin, topical NTG, statin and BB    CKD (chronic kidney disease) stage V requiring chronic dialysis/history of Renal transplant failure and rejection -Renal made aware of admission HD is M-W-F -pt endorses has chronic asymptomatic hyperkalemia    Aortic regurgitation -no s/s CHF    Atrial fibrillation -rate  controlled on Lopressor    HTN (hypertension) -BP controlled    Multiple sclerosis -not on meds pre admit    OSA (obstructive sleep apnea) -does not use CPAP at home    Thrombocytopenia -transient and stable   DVT prophylaxis: IV heparin Code Status: Full Family Communication: Patient Disposition Plan: Stepdown Isolation: None  Consultants: Cardiology  Procedures: Left heart catheterization pending  Antibiotics: None  HPI/Subjective: Patient alert and was without chest pain upon our examination earlier this morning. Denies shortness of breath. Endorses has issues with chronic asymptomatic hyperkalemia.   Objective: Blood pressure 136/33, pulse 62, temperature 98.8 F (37.1 C), temperature source Oral, resp. rate 22, height 5\' 10"  (1.778 m), weight 79.6 kg (175 lb 7.8 oz), SpO2 93.00%.  Intake/Output Summary (Last 24 hours) at 01/09/13 1356 Last data filed at 01/09/13 1300  Gross per 24 hour  Intake  109.5 ml  Output      0 ml  Net  109.5 ml     Exam: Followup exam completed. Patient admitted at 2:58 AM today    Data Reviewed: Basic Metabolic Panel:  Recent Labs Lab 01/09/13 0104 01/09/13 0119 01/09/13 0415  NA  --  142 142  K  --  4.5 5.8*  CL  --  104 99  CO2  --   --  25  GLUCOSE  --  107* 97  BUN  --  55* 59*  CREATININE  --  6.20* 7.14*  CALCIUM  --   --  9.2  MG 2.6*  --   --   PHOS 6.8*  --   --    Liver Function Tests:  Recent Labs Lab 01/09/13 0104  AST 25  ALT 15  ALKPHOS 87  BILITOT  0.2*  PROT 8.2  ALBUMIN 3.6   No results found for this basename: LIPASE, AMYLASE,  in the last 168 hours No results found for this basename: AMMONIA,  in the last 168 hours CBC:  Recent Labs Lab 01/09/13 0104 01/09/13 0119 01/09/13 0415  WBC 10.3  --  9.6  HGB 13.3 14.3 12.8*  HCT 39.2 42.0 37.5*  MCV 97.3  --  96.9  PLT 142*  --  155   Cardiac Enzymes:  Recent Labs Lab 01/09/13 0415 01/09/13 0943  TROPONINI 4.40* 14.66*    BNP (last 3 results)  Recent Labs  01/09/13 0100  PROBNP 10160.0*   CBG:  Recent Labs Lab 01/09/13 0720 01/09/13 1136  GLUCAP 75 78    Recent Results (from the past 240 hour(s))  MRSA PCR SCREENING     Status: None   Collection Time    01/09/13  3:22 AM      Result Value Range Status   MRSA by PCR NEGATIVE  NEGATIVE Final   Comment:            The GeneXpert MRSA Assay (FDA     approved for NASAL specimens     only), is one component of a     comprehensive MRSA colonization     surveillance program. It is not     intended to diagnose MRSA     infection nor to guide or     monitor treatment for     MRSA infections.     Studies:  Recent x-ray studies have been reviewed in detail by the Attending Physician  Scheduled Meds:  Reviewed in detail by the Attending Physician   Junious Silk, ANP [facility] Office  [phone] Pager [phone]  On-Call/Text Page:      [email or web address]      password [email or web address]  If 7PM-7AM, please contact night-coverage [email or web address] Password [email or web address] 01/09/2013, 1:56 PM   LOS: 1 day   I have examined the patient, reviewed the chart and modified the above note which I agree with.   Bailey Kolbe,MD [phone] 01/09/2013, 5:04 PM

## 2013-01-09 NOTE — Consult Note (Signed)
[facility] Renal Consultation Note  Indication for Consultation:  Management of ESRD/hemodialysis; anemia, hypertension/volume and secondary hyperparathyroidism  HPI: [name] is a 57 y.o. male admitted with NSTEMI  taken to Cath Lab, with findings Triple vessel CAD with no focal targets for PCI and Preserved LV systolic function. 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 No reported problems at op kidney center.  Followed by Cardiologist,  Dr.Hinderlighter  With known Aortic Regurgitation.Now postop cath without chest pain or sob.        Past Medical History  Diagnosis Date  . Multiple sclerosis   . CAD (coronary artery disease)   . Aortic heart valve prolapse   . Peripheral vascular disease   . Sleep apnea     ordered bipap with 3L bled in, but pt will not wear  . Atrial fibrillation   . Multiple sclerosis   . 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  He had a renal Tx from 2008 to 2011.  Gets HD in [place], [place] on MWF schedule.      Past Surgical History  Procedure Laterality Date  . Av fistula placement    . Flash      flash pulmonary edema  . Kidney transplant  08/2007  . Hernia repair  07/2011  . Excised squamous cells at rectum  2006     History reviewed. No pertinent family history.    reports that he quit smoking about 14 years ago. His smoking use included Cigarettes. He has a 40 pack-year smoking history. He does not have any smokeless tobacco history on file. He reports that he does not drink alcohol or use illicit drugs.   Allergies  Allergen Reactions  . Diphenhydramine Itching    Only with IV doses. Tolerates oral.  . Penicillins Other (See Comments)    migraine  . Adhesive (Tape) Rash    Please use paper tape     Prior to Admission medications   Medication Sig Start Date End Date Taking? Authorizing Provider  acetaminophen (TYLENOL) 500 MG tablet Take 1,000 mg by mouth every 6 (six) hours as needed for pain or fever.   Yes Historical Provider, MD  aspirin EC 81 MG tablet Take 81 mg by mouth daily.   Yes Historical Provider, MD  b complex-vitamin c-folic acid (NEPHRO-VITE) 0.8 MG TABS Take 0.8 mg by mouth daily.    Yes Historical Provider, MD  calcium acetate (PHOSLO) 667 MG capsule Take 2,001 mg by mouth 3 (three) times daily with meals.   Yes Historical Provider, MD  citalopram (CELEXA) 20 MG tablet Take 20 mg by mouth daily.   Yes Historical Provider, MD  docusate sodium (COLACE) 100 MG capsule Take 400 mg by mouth 2 (two) times daily.   Yes Historical Provider, MD  folic acid (FOLVITE) 1 MG tablet Take 1 mg by mouth daily.   Yes Historical Provider, MD  gabapentin (NEURONTIN) 300 MG capsule Take 300 mg by mouth at bedtime.    Yes Historical Provider, MD  lanthanum (FOSRENOL) 1000 MG chewable tablet Chew 2,000 mg by mouth 3 (three) times daily. Takes with meals and snacks   Yes Historical Provider, MD  metoprolol (LOPRESSOR) 50 MG tablet Take 25-50 mg by mouth 2 (two) times daily.  25mg  twice daily on dialysis days (M,W,F) Non-diaylsis days: 50mg  in the morning 25mg  in the evening   Yes Historical Provider, MD  omeprazole (PRILOSEC OTC) 20 MG tablet Take 20 mg by mouth 2 (two) times daily.    Yes Historical Provider, MD  Probiotic Product (PROBIOTIC PO) Take 1 capsule by mouth 2 (two) times daily.   Yes Historical Provider, MD  simvastatin (ZOCOR) 20 MG tablet Take 10 mg by mouth 2 (two) times daily.    Yes Historical Provider, MD    ZOX:[id number], acetaminophen, ondansetron (ZOFRAN) IV, ondansetron  Results for orders placed during the hospital encounter of 01/08/13 (from the past 48 hour(s))  PRO B NATRIURETIC PEPTIDE     Status: Abnormal   Collection Time    01/09/13  1:00 AM      Result  Value Range   Pro B Natriuretic peptide (BNP) 10160.0 (*) 0 - 125 pg/mL  CBC     Status: Abnormal   Collection Time    01/09/13  1:04 AM      Result Value Range   WBC 10.3  4.0 - 10.5 K/uL   RBC 4.03 (*) 4.22 - 5.81 MIL/uL   Hemoglobin 13.3  13.0 - 17.0 g/dL   HCT 14.7  82.9 - 56.2 %   MCV 97.3  78.0 - 100.0 fL   MCH 33.0  26.0 - 34.0 pg   MCHC 33.9  30.0 - 36.0 g/dL   RDW 13.0 (*) 86.5 - 78.4 %   Platelets 142 (*) 150 - 400 K/uL  MAGNESIUM     Status: Abnormal   Collection Time    01/09/13  1:04 AM      Result Value Range   Magnesium 2.6 (*) 1.5 - 2.5 mg/dL  PHOSPHORUS     Status: Abnormal   Collection Time    01/09/13  1:04 AM      Result Value Range   Phosphorus 6.8 (*) 2.3 - 4.6 mg/dL  HEPATIC FUNCTION PANEL     Status: Abnormal   Collection Time    01/09/13  1:04 AM      Result Value Range   Total Protein 8.2  6.0 - 8.3 g/dL   Albumin 3.6  3.5 - 5.2 g/dL   AST 25  0 - 37 U/L   ALT 15  0 - 53 U/L   Alkaline Phosphatase 87  39 - 117 U/L   Total Bilirubin 0.2 (*) 0.3 - 1.2 mg/dL   Bilirubin, Direct <6.9  0.0 - 0.3 mg/dL   Indirect Bilirubin NOT CALCULATED  0.3 - 0.9 mg/dL  POCT I-STAT TROPONIN I     Status: Abnormal   Collection Time    01/09/13  1:17 AM      Result Value Range   Troponin i, poc 0.47 (*) 0.00 - 0.08 ng/mL   Comment NOTIFIED PHYSICIAN     Comment 3            Comment: Due to the release kinetics of cTnI,     a negative result within the first hours     of the onset of symptoms does not rule out     myocardial infarction with certainty.     If myocardial infarction is still suspected,     repeat the test at appropriate intervals.  POCT I-STAT, CHEM 8     Status: Abnormal   Collection Time    01/09/13  1:19 AM      Result Value Range   Sodium  142  135 - 145 mEq/L   Potassium 4.5  3.5 - 5.1 mEq/L   Chloride 104  96 - 112 mEq/L   BUN 55 (*) 6 - 23 mg/dL   Creatinine, Ser 1.61 (*) 0.50 - 1.35 mg/dL   Glucose, Bld 096 (*) 70 - 99 mg/dL    Calcium, Ion 0.45 (*) 1.12 - 1.23 mmol/L   TCO2 30  0 - 100 mmol/L   Hemoglobin 14.3  13.0 - 17.0 g/dL   HCT 40.9  81.1 - 91.4 %  MRSA PCR SCREENING     Status: None   Collection Time    01/09/13  3:22 AM      Result Value Range   MRSA by PCR NEGATIVE  NEGATIVE   Comment:            The GeneXpert MRSA Assay (FDA     approved for NASAL specimens     only), is one component of a     comprehensive MRSA colonization     surveillance program. It is not     intended to diagnose MRSA     infection nor to guide or     monitor treatment for     MRSA infections.  TROPONIN I     Status: Abnormal   Collection Time    01/09/13  4:15 AM      Result Value Range   Troponin I 4.40 (*) <0.30 ng/mL   Comment:            Due to the release kinetics of cTnI,     a negative result within the first hours     of the onset of symptoms does not rule out     myocardial infarction with certainty.     If myocardial infarction is still suspected,     repeat the test at appropriate intervals.     CRITICAL RESULT CALLED TO, READ BACK BY AND VERIFIED WITH:     Horace Porteous RN [id number] [phone] [facility] [place], S  BASIC METABOLIC PANEL     Status: Abnormal   Collection Time    01/09/13  4:15 AM      Result Value Range   Sodium 142  135 - 145 mEq/L   Potassium 5.8 (*) 3.5 - 5.1 mEq/L   Chloride 99  96 - 112 mEq/L   CO2 25  19 - 32 mEq/L   Glucose, Bld 97  70 - 99 mg/dL   BUN 59 (*) 6 - 23 mg/dL   Creatinine, Ser 8.65 (*) 0.50 - 1.35 mg/dL   Calcium 9.2  8.4 - 78.4 mg/dL   GFR calc non Af Amer 8 (*) >90 mL/min   GFR calc Af Amer 9 (*) >90 mL/min   Comment:            The eGFR has been calculated     using the CKD EPI equation.     This calculation has not been     validated in all clinical     situations.     eGFR's persistently     <90 mL/min signify     possible Chronic Kidney Disease.  CBC     Status: Abnormal   Collection Time    01/09/13  4:15 AM      Result Value Range   WBC 9.6  4.0 - 10.5  K/uL   RBC 3.87 (*) 4.22 - 5.81 MIL/uL   Hemoglobin 12.8 (*) 13.0 - 17.0 g/dL   HCT 69.6 (*)  39.0 - 52.0 %   MCV 96.9  78.0 - 100.0 fL   MCH 33.1  26.0 - 34.0 pg   MCHC 34.1  30.0 - 36.0 g/dL   RDW 16.1 (*) 09.6 - 04.5 %   Platelets 155  150 - 400 K/uL  GLUCOSE, CAPILLARY     Status: None   Collection Time    01/09/13  7:20 AM      Result Value Range   Glucose-Capillary 75  70 - 99 mg/dL  HEPARIN LEVEL (UNFRACTIONATED)     Status: Abnormal   Collection Time    01/09/13  9:42 AM      Result Value Range   Heparin Unfractionated 0.10 (*) 0.30 - 0.70 IU/mL   Comment:            IF HEPARIN RESULTS ARE BELOW     EXPECTED VALUES, AND PATIENT     DOSAGE HAS BEEN CONFIRMED,     SUGGEST FOLLOW UP TESTING     OF ANTITHROMBIN III LEVELS.  PROTIME-INR     Status: None   Collection Time    01/09/13  9:42 AM      Result Value Range   Prothrombin Time 15.0  11.6 - 15.2 seconds   INR 1.20  0.00 - 1.49  TROPONIN I     Status: Abnormal   Collection Time    01/09/13  9:43 AM      Result Value Range   Troponin I 14.66 (*) <0.30 ng/mL   Comment:            Due to the release kinetics of cTnI,     a negative result within the first hours     of the onset of symptoms does not rule out     myocardial infarction with certainty.     If myocardial infarction is still suspected,     repeat the test at appropriate intervals.     CRITICAL VALUE NOTED.  VALUE IS CONSISTENT WITH PREVIOUSLY REPORTED AND CALLED VALUE.  GLUCOSE, CAPILLARY     Status: None   Collection Time    01/09/13 11:36 AM      Result Value Range   Glucose-Capillary 78  70 - 99 mg/dL  POCT ACTIVATED CLOTTING TIME     Status: None   Collection Time    01/09/13  1:38 PM      Result Value Range   Activated Clotting Time 115    POCT ACTIVATED CLOTTING TIME     Status: None   Collection Time    01/09/13  1:57 PM      Result Value Range   Activated Clotting Time 383    GLUCOSE, CAPILLARY     Status: None   Collection Time     01/09/13  4:21 PM      Result Value Range   Glucose-Capillary 92  70 - 99 mg/dL   .  ROS: Chest pain as noted in hpi    Physical Exam: Filed Vitals:   01/09/13 1618  BP: 138/36  Pulse: 63  Temp: 97.6 F (36.4 C)  Resp: 17     General:alert WM , NAD , appropriate HEENT: [place] , mmm Eyes:  [place] Neck:  Supple, no jvd Heart:  RRR, 2/6 sem.,  And 2/6 dm at apex and rsb , no rub Lungs: Basilar crackles Abdomen: soft, nontender Extremities: trace pedal edema Skin: no vert rash or ulcer Neuro: Alert, OX3, no acute deficits noted Dialysis Access:  Pos. Bruit  LFA AVF  Dialysis Orders: [facility] MWF  78kg  1K/2.5Ca bath   4hrs   Heparin 8000   L FA AVF  400/800    Zemplar none Epogen none  Venofer  100mg  q wed hd  Assessment/Plan 1. NSTEMI/ Triple vessel CAD= noted on card. Cath no focal targets for PCI/ preserved LV Systolic  Function, Card Recommends medical tx for now/ "If recurrent Angina  Not ablet obe tx with meds needs CABG and Av replacement with his HO  Moderate  AI"  [place]  CArd. TXing 2. ESRD -  MWF HD ([facility] ) uses 1.0 k bath as op/ do 1st shift hd as dw Dr. Arlean Hopping. 3. Moderate Aortic Reg. = followed by [facility] 4. Hypertension/volume  - edw  78.0  cxr with no excess vol  And by wt  79. 0 Hd in am 1.5 to 2 liters uf/ Need standing wts if possible. BB and Nitrates per card. Current bp 138/36 5. Anemia  - hgb =12.8/ no epo but weekly iron on hd 6. Metabolic bone disease -  Phos elevated on admit and as op / continue renal diet and Phoslo 3 with meals / 0 hectorol 7. Nutrition - renavite and renal diet/ last op alb 3.7 8. MS = on no meds 9. OS= no Cpap use 10. Ho AFIB= was on bb only and 81mg  asa before admit  Lenny Pastel, PA-C [facility] Beeper [phone] 01/09/2013, 4:26 PM   Patient seen and examined.  Agree with assessment and plan as above. Vinson Moselle  MD [phone] pgr    [phone] cell 01/09/2013, 5:43 PM

## 2013-01-09 NOTE — ED Provider Notes (Signed)
History     CSN: [id number]  Arrival date & time 01/08/13  2356   First MD Initiated Contact with Patient 01/09/13 0019      Chief Complaint  Patient presents with  . Chest Pain  . Nausea  . Shortness of Breath   HPI [name] is a 57 y.o. male whom I have seen before for chest pain in the emergency department. Patient has a history of end-stage renal disease on Monday Wednesday Friday dialysis, history of coronary artery disease, multiple sclerosis, atrial fibrillation is also status post renal transplant failure and rejection. Patient arrives today with chest pain that started at about 9:00. Chest pain was more intense than it is now, currently 7/10, it is an aching pain located substernally it radiates to both shoulders, initially was associated with diaphoresis, nausea, vomiting x1 and some mild shortness of breath. Patient attempted to take an aspirin at the time but vomited the aspirin. He says she's been having similar symptoms an hour to 2 hours after he eats all week but did not been this intense. Denies any abdominal pain, productive cough, fever, chills.   Past Medical History  Diagnosis Date  . Multiple sclerosis   . CAD (coronary artery disease)   . Aortic heart valve prolapse   . Peripheral vascular disease   . Sleep apnea     ordered bipap with 3L bled in, but pt will not wear  . Atrial fibrillation   . Multiple sclerosis   . 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  He had a renal Tx from 2008 to 2011.  Gets HD in [place], [place] on MWF schedule.      Past Surgical History  Procedure Laterality Date  . Av fistula placement    . Flash      flash pulmonary edema  . Kidney transplant  08/2007  . Hernia repair  07/2011  . Excised squamous cells at rectum  2006    No family history on file.  History  Substance Use Topics  . Smoking status:  Former Smoker -- 2.00 packs/day for 20 years    Types: Cigarettes    Quit date: 08/06/1998  . Smokeless tobacco: Not on file  . Alcohol Use: No      Review of Systems At least 10pt or greater review of systems completed and are negative except where specified in the HPI.  Allergies  Diphenhydramine; Penicillins; and Adhesive  Home Medications   Current Outpatient Rx  Name  Route  Sig  Dispense  Refill  . acetaminophen (TYLENOL) 500 MG tablet   Oral   Take 1,000 mg by mouth every 6 (six) hours as needed for pain or fever.         Marland Kitchen aspirin EC 81 MG tablet   Oral   Take 81 mg by mouth daily.         Marland Kitchen b complex-vitamin c-folic acid (NEPHRO-VITE) 0.8 MG TABS   Oral   Take 0.8 mg by mouth daily.          . calcium acetate (PHOSLO) 667 MG capsule   Oral   Take 2,001 mg by mouth 3 (three) times daily with meals.         . citalopram (CELEXA) 20 MG tablet   Oral   Take 20 mg by mouth daily.         Marland Kitchen  docusate sodium (COLACE) 100 MG capsule   Oral   Take 400 mg by mouth 2 (two) times daily.         . folic acid (FOLVITE) 1 MG tablet   Oral   Take 1 mg by mouth daily.         Marland Kitchen gabapentin (NEURONTIN) 300 MG capsule   Oral   Take 300 mg by mouth every hemodialysis. Take on Monday, Wednesday, and Friday         . lanthanum (FOSRENOL) 1000 MG chewable tablet   Oral   Chew 2,000 mg by mouth 3 (three) times daily. Takes with meals and snacks         . metoprolol (LOPRESSOR) 50 MG tablet   Oral   Take 50 mg by mouth 2 (two) times daily. 25mg  twice daily on dialysis days (M,W,F) Non-diaylsis days: 50mg  in the morning 25mg  in the evening         . omeprazole (PRILOSEC OTC) 20 MG tablet   Oral   Take 20 mg by mouth 2 (two) times daily.          . Probiotic Product (PROBIOTIC PO)   Oral   Take 1 capsule by mouth 2 (two) times daily.         . simvastatin (ZOCOR) 20 MG tablet   Oral   Take 10 mg by mouth daily.           BP 164/44   Temp(Src) 97.6 F (36.4 C) (Oral)  Resp 18  SpO2 94%  Physical Exam  Nursing notes reviewed.  Electronic medical record reviewed. VITAL SIGNS:   Filed Vitals:   01/09/13 0013 01/09/13 0030  BP: 164/44 165/55  Pulse:  72  Temp: 97.6 F (36.4 C)   TempSrc: Oral   Resp: 18 18  SpO2: 94% 97%   CONSTITUTIONAL: Awake, oriented x4, appears non-toxic HENT: Atraumatic, normocephalic, oral mucosa pink and moist, airway patent. Nares patent without drainage. External ears normal. EYES: Conjunctiva clear, EOMI, PERRLA NECK: Trachea midline, non-tender, supple CARDIOVASCULAR: Normal heart rate, Normal rhythm, 3/6 systolic murmur loudest at the right upper sternal border, 2/6 diastolic murmur PULMONARY/CHEST: Clear to auscultation, no rhonchi, wheezes, or rales. Symmetrical breath sounds. Non-tender. ABDOMINAL: Non-distended, soft, non-tender - no rebound or guarding.  Patient has a right-sided well-healed surgical scar with incisional hernia. BS normal. NEUROLOGIC: Non-focal, moving all four extremities, no gross sensory or motor deficits. EXTREMITIES: No clubbing, cyanosis, or edema. Dialysis fistula on left forearm has good thrill and bruit. Lower extremities have thickened and darkened skin consistent with venous stasis disease with trace edema SKIN: Warm, Dry, No erythema, No rash  ED Course  Procedures (including critical care time)  Date: 01/09/2013  Rate: 73  Rhythm: normal sinus rhythm  QRS Axis: normal  Intervals: normal  ST/T Wave abnormalities: Borderline ST depression V3 V4, PR depression in lead 3 and aVF  Conduction Disutrbances: none  Narrative Interpretation: Patient does have some borderline ST depression in the lateral leads this is new since prior EKG dated 12/04/12   Labs Reviewed  PRO B NATRIURETIC PEPTIDE - Abnormal; Notable for the following:    Pro B Natriuretic peptide (BNP) 10160.0 (*)    All other components within normal limits  CBC - Abnormal; Notable for  the following:    RBC 4.03 (*)    RDW 16.0 (*)    Platelets 142 (*)    All other components within normal limits  MAGNESIUM - Abnormal; Notable for  the following:    Magnesium 2.6 (*)    All other components within normal limits  PHOSPHORUS - Abnormal; Notable for the following:    Phosphorus 6.8 (*)    All other components within normal limits  HEPATIC FUNCTION PANEL - Abnormal; Notable for the following:    Total Bilirubin 0.2 (*)    All other components within normal limits  POCT I-STAT, CHEM 8 - Abnormal; Notable for the following:    BUN 55 (*)    Creatinine, Ser 6.20 (*)    Glucose, Bld 107 (*)    Calcium, Ion 1.03 (*)    All other components within normal limits  POCT I-STAT TROPONIN I - Abnormal; Notable for the following:    Troponin i, poc 0.47 (*)    All other components within normal limits   Dg Chest Port 1 View  01/09/2013  *RADIOLOGY REPORT*  Clinical Data: Sudden onset chest pain  PORTABLE CHEST - 1 VIEW  Comparison: Chest radiograph 12/04/2012  Findings: Stable enlarged heart silhouette.  There are chronic bronchitic markings.  No effusion, infiltrate, or pneumothorax.  No aggressive osseous lesions.  IMPRESSION: Cardiomegaly and chronic bronchitic change.  No acute findings.   Original Report Authenticated By: Genevive Bi, M.D.      1. Chest pain   2. NSTEMI (non-ST elevated myocardial infarction)       MDM  Patient presents with chest pain and EKG changes, ST depression anterior leads. Patient currently chest pain 7/10. Patient's chest pain decreased to 3/10 after nitroglycerin and aspirin, patient's EKG normalized.  Troponin is elevated at 0.47 suggestive of NSTEMI.  Discussed patient's case with Dr. Shirlee Latch and Dr. Modena Nunnery to be admitted to step down will heparinize, discussed plan with patient.  Patient admitted stable  Cardiologist is Dr. Berline Lopes at [facility], MD 01/09/13 [phone]

## 2013-01-09 NOTE — H&P (Signed)
[facility] History and Physical  [name] MVH:[id number] DOB: [birth year] DOA: 01/08/2013  Referring physician: Dr.Bonk. PCP: Dina Rich, MD  Specialists:Dr. Hindenlighter cardiologist at [facility].                     [facility] for dialysis.      Chief Complaint: Chest pain.   HPI: [name] is a 57 y.o. male with known history of CAD managed medically, ESRD on hemodialysis, hypertension, aortic valve regurgitation, hyperlipidemia presents to the ER because of chest pain. Patient started developing chest pain last night around 9 PM while he was at home on the commode. The chest pain was retrosternal pressure-like radiating to the neck. Had associated mild shortness of breath with nausea and vomiting. Denies any abdominal pain fever chills. Did have diaphoresis. Patient was brought to the ER. EKG shows ST depression in anterior leads and nonspecific ST-T changes in the inferior leads. Patient's troponin came positive and cardiology on call was consulted at this time patient has been admitted for further management. Patient has been started on heparin and nitroglycerin patch. Patient chest pain improved with nitroglycerin sublingual was still has mild chest pain. Patient usually follows at [facility] for his coronary disease and is being managed by Dr. Serita Butcher. Patient was admitted for chest pain last month and had followed with his cardiologist 2 weeks ago.   Review of Systems: As presented in the history of presenting illness, rest negative.  Past Medical History  Diagnosis Date  . Multiple sclerosis   . CAD (coronary artery disease)   . Aortic heart valve prolapse   . Peripheral vascular disease   . Sleep apnea     ordered bipap with 3L bled in, but pt will not wear  . Atrial fibrillation   . Multiple sclerosis   . 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 ESRD on hemodialysis 02/12/2012    Patient had ESRD from  membranous GN and started HD in 2000.  He had a renal Tx from 2008 to 2011.  Gets HD in [place], [place] on MWF schedule.     Past Surgical History  Procedure Laterality Date  . Av fistula placement    . Flash      flash pulmonary edema  . Kidney transplant  08/2007  . Hernia repair  07/2011  . Excised squamous cells at rectum  2006   Social History:  reports that he quit smoking about 14 years ago. His smoking use included Cigarettes. He has a 40 pack-year smoking history. He does not have any smokeless tobacco history on file. He reports that he does not drink alcohol or use illicit drugs. Lives at home.  where does patient live-- Can do ADLs. Can patient participate in ADLs?  Allergies  Allergen Reactions  . Diphenhydramine Itching    Only with IV doses. Tolerates oral.  . Penicillins Other (See Comments)    migraine  . Adhesive (Tape) Rash    Please use paper tape    History reviewed. No pertinent family history.    Prior to Admission medications   Medication Sig Start Date End Date Taking? Authorizing Provider  acetaminophen (TYLENOL) 500 MG tablet Take 1,000 mg by mouth every 6 (six) hours as needed for pain or fever.   Yes Historical Provider, MD  aspirin EC 81 MG tablet Take 81 mg by mouth daily.   Yes Historical Provider, MD  b complex-vitamin c-folic acid (NEPHRO-VITE)  0.8 MG TABS Take 0.8 mg by mouth daily.    Yes Historical Provider, MD  calcium acetate (PHOSLO) 667 MG capsule Take 2,001 mg by mouth 3 (three) times daily with meals.   Yes Historical Provider, MD  citalopram (CELEXA) 20 MG tablet Take 20 mg by mouth daily.   Yes Historical Provider, MD  docusate sodium (COLACE) 100 MG capsule Take 400 mg by mouth 2 (two) times daily.   Yes Historical Provider, MD  folic acid (FOLVITE) 1 MG tablet Take 1 mg by mouth daily.   Yes Historical Provider, MD  gabapentin (NEURONTIN) 300 MG capsule Take 300 mg by mouth at bedtime.    Yes Historical Provider, MD  lanthanum  (FOSRENOL) 1000 MG chewable tablet Chew 2,000 mg by mouth 3 (three) times daily. Takes with meals and snacks   Yes Historical Provider, MD  metoprolol (LOPRESSOR) 50 MG tablet Take 25-50 mg by mouth 2 (two) times daily. 25mg  twice daily on dialysis days (M,W,F) Non-diaylsis days: 50mg  in the morning 25mg  in the evening   Yes Historical Provider, MD  omeprazole (PRILOSEC OTC) 20 MG tablet Take 20 mg by mouth 2 (two) times daily.    Yes Historical Provider, MD  Probiotic Product (PROBIOTIC PO) Take 1 capsule by mouth 2 (two) times daily.   Yes Historical Provider, MD  simvastatin (ZOCOR) 20 MG tablet Take 10 mg by mouth 2 (two) times daily.    Yes Historical Provider, MD   Physical Exam: Filed Vitals:   01/09/13 0150 01/09/13 0200 01/09/13 0230 01/09/13 0240  BP: 146/50 138/45 137/46 135/42  Pulse: 70 70 64 65  Temp:      TempSrc:      Resp: 21 17 20  33  SpO2: 95% 95% 95% 95%     General:  Well-developed and nourished.   Eyes: Anicteric no pallor.  ENT: No discharge from ears eyes nose and mouth.   Neck: No mass felt.   Cardiovascular: S1-S2 heard.   Respiratory: No rhonchi or crepitations.   Abdomen: Soft nontender bowel sounds present.   Skin: No rash.   Musculoskeletal: No edema.   Psychiatric: Appears normal.   Neurologic: Alert awake oriented to time place and person. Moves all extremities.   Labs on Admission:  Basic Metabolic Panel:  Recent Labs Lab 01/09/13 0104 01/09/13 0119  NA  --  142  K  --  4.5  CL  --  104  GLUCOSE  --  107*  BUN  --  55*  CREATININE  --  6.20*  MG 2.6*  --   PHOS 6.8*  --    Liver Function Tests:  Recent Labs Lab 01/09/13 0104  AST 25  ALT 15  ALKPHOS 87  BILITOT 0.2*  PROT 8.2  ALBUMIN 3.6   No results found for this basename: LIPASE, AMYLASE,  in the last 168 hours No results found for this basename: AMMONIA,  in the last 168 hours CBC:  Recent Labs Lab 01/09/13 0104 01/09/13 0119  WBC 10.3  --   HGB 13.3  14.3  HCT 39.2 42.0  MCV 97.3  --   PLT 142*  --    Cardiac Enzymes: No results found for this basename: CKTOTAL, CKMB, CKMBINDEX, TROPONINI,  in the last 168 hours  BNP (last 3 results)  Recent Labs  01/09/13 0100  PROBNP 10160.0*   CBG: No results found for this basename: GLUCAP,  in the last 168 hours  Radiological Exams on Admission: Dg Chest Port 1  View  01/09/2013  *RADIOLOGY REPORT*  Clinical Data: Sudden onset chest pain  PORTABLE CHEST - 1 VIEW  Comparison: Chest radiograph 12/04/2012  Findings: Stable enlarged heart silhouette.  There are chronic bronchitic markings.  No effusion, infiltrate, or pneumothorax.  No aggressive osseous lesions.  IMPRESSION: Cardiomegaly and chronic bronchitic change.  No acute findings.   Original Report Authenticated By: Genevive Bi, M.D.     EKG: Independently reviewed. Normal sinus rhythm with ST depression in the anterior leads and nonspecific ST changes in the inferior leads.   Assessment/Plan Principal Problem:   Non-ST elevation MI (NSTEMI) Active Problems:   ESRD on hemodialysis   Thrombocytopenia   Aortic valve disease   1. Non-ST elevation MI  - patient will be kept in plain anticipation of possible procedure. Continue with heparin infusion nitroglycerin paste. Continue aspirin. Patient is on a beta blocker.  2. ESRD on hemodialysis Monday Wednesday and Friday  - consult nephrology for dialysis.  3. Aortic valve regurgitation  - patient states he was just recently evaluated by his cardiologist for this and was mentioned it has been stable.  4. Hyperlipidemia - continue statins. 5. History of thrombocytopenia - closely follow CBC.     Code Status: Full code.   Family Communication: Patient wife at the bedside.   Disposition Plan: Admit to inpatient.     KAKRAKANDY,ARSHAD N. [facility] Pager [phone].  If 7PM-7AM, please contact night-coverage [email or web address] Password [email or web address] 01/09/2013, 2:58 AM

## 2013-01-09 NOTE — Progress Notes (Signed)
25cm 57F sheath removed from the Rt. Femoral artery @ 1622.  Manual pressure applied to site for 20 min. Bandage applied.  Post sheath removal instructions given.  Pt acknowledges and understands.  Easily palpable DP on the Rt foot.  No hematoma.  Abdominal hernia noted pre and post sheath removal above the site.  BP 138/43. HR 60  Bedrest starts @ 1645 post sheath removal.

## 2013-01-09 NOTE — Consult Note (Addendum)
Referring Physician:  Primary Physician: Dina Rich, MD Primary Cardiologist:  Birdena Crandall. Hinderliter, MD Associate Professor, Division of Cardiology Clinical Appointment Phone: [phone] [facility] Fax: [phone] (Dr Donnie Aho saw in consult 12/04/2012 but he does not follow pt.)  Reason for Consultation: NSTEMI  HPI: [name] is a 57 y.o. male with a history of CAD. He has been having episodes of chest pain that were previously and frequent. However, he has had 3 in the last week. They'll resolved with rest in about 30 minutes. He saw his primary cardiologist on April 11 and Dr. Emogene Morgan was reluctant to cath him because of his multiple medical problems and felt there was a significant risk of cardiac catheterization causing a stroke. 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 Because of this, he has been treated medically.  At 9:30 last night, at rest, he had sudden onset of epigastric pain that radiated up into his chest, both sides of his neck and both sides of his jaw. It also went to both shoulders. It was a 9 or 10/10. It was associated with some mild shortness of breath. He became nauseated and diaphoretic. He vomited once. He does not have nitroglycerin at home. When his symptoms did not resolve, his wife drove him to [facility]. In the emergency room, he was given sublingual nitroglycerin x3, Zofran 4 mg, heparin and started on nitroglycerin paste. He was also given a GI cocktail. He feels the nitroglycerin helped more than anything else. His symptoms resolved and currently is chest pain is a 0/10.  This is the chest pain he has been having but this episode was more severe and did not resolve until he received nitroglycerin. Currently he is resting comfortably.  Review of Systems:     Cardiac Review of Systems: {Y] = yes [ ]  = no  Chest Pain [   Y ]  Resting SOB [ N  ] Exertional SOB  [ Y ]  Orthopnea Klaus.Mock  ]   Pedal Edema [  Y  ]    Palpitations Klaus.Mock  ] Syncope  Klaus.Mock  ]   Presyncope [  N ]  General Review of Systems: [Y] = yes [  ]=no Constitional: recent weight change [  ]; anorexia [  ]; fatigue [  ]; nausea [  ]; night sweats [  ]; fever [  ]; or chills [  ];                                                                                                                                          Dental: poor dentition[  ];    Eye : blurred vision [ Y ] episodic; diplopia [   ]; vision changes [  ];  Amaurosis fugax[  ]; Resp: cough [  ];  wheezing[  ];  hemoptysis[  ]; shortness  of breath[  ]; paroxysmal nocturnal dyspnea[  ]; dyspnea on exertion[  ]; or orthopnea[  ];  GI:  gallstones[  ], vomiting[  ];  dysphagia[  ]; melena[  ];  hematochezia [  ]; heartburn[  ];   Hx of  Colonoscopy[  ]; GU: kidney stones [  ]; hematuria[  ];   dysuria [  ];  nocturia[  ];  history of     obstruction [  ];                 Skin: rash, swelling[  ];, hair loss[  ];  peripheral edema[  ];  or itching[  ]; Musculosketetal: myalgias[ y ];  joint swelling[  ];  joint erythema[  ];  joint pain[  ];  back pain[  ];  Heme/Lymph: bruising[  ];  bleeding[  ];  anemia[  ];  Neuro: TIA[  ];  headaches[  ];  stroke[  ];  vertigo[  ];  seizures[  ];   paresthesias[  ];  difficulty walking[  ];  Psych:depression[  ]; anxiety[  ];  Endocrine: diabetes[  ];  thyroid dysfunction[  ];  Immunizations: Flu [  ]; Pneumococcal[  ];  Other:  Past Medical History  Diagnosis Date  . Multiple sclerosis   . CAD (coronary artery disease)   . Aortic heart valve prolapse   . Peripheral vascular disease   . Sleep apnea     ordered bipap with 3L bled in, but pt will not wear  . Atrial fibrillation   . Multiple sclerosis   . 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  He had a renal Tx from 2008 to 2011.  Gets HD in [place], [place] on MWF schedule.       Medications Prior to Admission  Medication Sig Dispense Refill  . acetaminophen (TYLENOL) 500 MG tablet Take 1,000 mg by mouth every 6 (six) hours as needed for pain or fever.      Marland Kitchen aspirin EC 81 MG tablet Take 81 mg by mouth daily.      Marland Kitchen b complex-vitamin c-folic acid (NEPHRO-VITE) 0.8 MG TABS Take 0.8 mg by mouth daily.       . calcium acetate (PHOSLO) 667 MG capsule Take 2,001 mg by mouth 3 (three) times daily with meals.      . citalopram (CELEXA) 20 MG tablet Take 20 mg by mouth daily.      Marland Kitchen docusate sodium (COLACE) 100 MG capsule Take 400 mg by mouth 2 (two) times daily.      . folic acid (FOLVITE) 1 MG tablet Take 1 mg by mouth daily.      Marland Kitchen gabapentin (NEURONTIN) 300 MG capsule Take 300 mg by mouth at bedtime.       Marland Kitchen lanthanum (FOSRENOL) 1000 MG chewable tablet Chew 2,000 mg by mouth 3 (three) times daily. Takes with meals and snacks      . metoprolol (LOPRESSOR) 50 MG tablet Take 25-50 mg by mouth 2 (two) times daily. 25mg  twice daily on dialysis days (M,W,F) Non-diaylsis days: 50mg  in the morning 25mg  in the evening      . omeprazole (PRILOSEC OTC) 20 MG tablet Take 20 mg by mouth 2 (two) times daily.       . Probiotic Product (PROBIOTIC PO) Take 1 capsule by mouth 2 (two) times  daily.      . simvastatin (ZOCOR) 20 MG tablet Take 10 mg by mouth 2 (two) times daily.         Current Meds . aspirin EC  325 mg Oral Daily  . calcium acetate  2,001 mg Oral TID WC  . citalopram  20 mg Oral Daily  . docusate sodium  400 mg Oral BID  . folic acid  1 mg Oral Daily  . gabapentin  300 mg Oral QHS  . lanthanum  2,000 mg Oral TID WC  . metoprolol  50 mg Oral Custom  . [START ON 01/10/2013] metoprolol tartrate  25 mg Oral Custom  . metoprolol tartrate  25 mg Oral Custom  . nitroGLYCERIN  0.5 inch Topical Q6H  . pantoprazole  40 mg Oral BID AC  . [START ON 01/10/2013] pneumococcal 23 valent vaccine  0.5 mL Intramuscular Tomorrow-1000  . simvastatin  10 mg Oral BID  . sodium  chloride  3 mL Intravenous Q12H  . sodium chloride  3 mL Intravenous Q12H   Infusions:  . heparin 1,000 Units/hr (01/09/13 0251)    Allergies  Allergen Reactions  . Diphenhydramine Itching    Only with IV doses. Tolerates oral.  . Penicillins Other (See Comments)    migraine  . Adhesive (Tape) Rash    Please use paper tape    History   Social History  . Marital Status: Married    Spouse Name: N/A    Number of Children: N/A  . Years of Education: N/A   Occupational History  . Disabled    Social History Main Topics  . Smoking status: Former Smoker -- 2.00 packs/day for 20 years    Types: Cigarettes    Quit date: 08/06/1998  . Smokeless tobacco: Not on file  . Alcohol Use: No  . Drug Use: No  . Sexually Active:    Other Topics Concern  . Not on file   Social History Narrative   No history of premature CAD in either parents or siblings.    History reviewed. No pertinent family history. No family status information on file.    PHYSICAL EXAM: Filed Vitals:   01/09/13 0600  BP: 147/31  Pulse: 60  Temp:   Resp: 20     Intake/Output Summary (Last 24 hours) at 01/09/13 1610 Last data filed at 01/09/13 0600  Gross per 24 hour  Intake   31.5 ml  Output      0 ml  Net   31.5 ml    General:  Well appearing. No respiratory difficulty HEENT: normal Neck: supple. Mild JVD. Carotids 2+ bilat; Bilat bruits (rad of murmur). No lymphadenopathy or thryomegaly appreciated. Cor: PMI nondisplaced. Regular rate & rhythm. No rubs, gallops, 2-3/6 SEM, 2/6 DM. Lungs: clear except for basilar rales Abdomen: soft, nontender, nondistended. No hepatosplenomegaly. No bruits or masses. Good bowel sounds. Extremities: no cyanosis, clubbing, rash, trace LLE edema; Distal pulses 2+ in all extrem except LUE where he has dialysis access Neuro: alert & oriented x 3, cranial nerves grossly intact. moves all 4 extremities w/o difficulty. Affect pleasant.  ECG: SR, minimal inferior ST  elevation, similar to an ECG dated 12/04/2012   Results for orders placed during the hospital encounter of 01/08/13 (from the past 24 hour(s))  PRO B NATRIURETIC PEPTIDE     Status: Abnormal   Collection Time    01/09/13  1:00 AM      Result Value Range   Pro B Natriuretic peptide (BNP)  10160.0 (*) 0 - 125 pg/mL  CBC     Status: Abnormal   Collection Time    01/09/13  1:04 AM      Result Value Range   WBC 10.3  4.0 - 10.5 K/uL   RBC 4.03 (*) 4.22 - 5.81 MIL/uL   Hemoglobin 13.3  13.0 - 17.0 g/dL   HCT 16.1  09.6 - 04.5 %   MCV 97.3  78.0 - 100.0 fL   MCH 33.0  26.0 - 34.0 pg   MCHC 33.9  30.0 - 36.0 g/dL   RDW 40.9 (*) 81.1 - 91.4 %   Platelets 142 (*) 150 - 400 K/uL  MAGNESIUM     Status: Abnormal   Collection Time    01/09/13  1:04 AM      Result Value Range   Magnesium 2.6 (*) 1.5 - 2.5 mg/dL  PHOSPHORUS     Status: Abnormal   Collection Time    01/09/13  1:04 AM      Result Value Range   Phosphorus 6.8 (*) 2.3 - 4.6 mg/dL  HEPATIC FUNCTION PANEL     Status: Abnormal   Collection Time    01/09/13  1:04 AM      Result Value Range   Total Protein 8.2  6.0 - 8.3 g/dL   Albumin 3.6  3.5 - 5.2 g/dL   AST 25  0 - 37 U/L   ALT 15  0 - 53 U/L   Alkaline Phosphatase 87  39 - 117 U/L   Total Bilirubin 0.2 (*) 0.3 - 1.2 mg/dL   Bilirubin, Direct <7.8  0.0 - 0.3 mg/dL   Indirect Bilirubin NOT CALCULATED  0.3 - 0.9 mg/dL  POCT I-STAT TROPONIN I     Status: Abnormal   Collection Time    01/09/13  1:17 AM      Result Value Range   Troponin i, poc 0.47 (*) 0.00 - 0.08 ng/mL   Comment NOTIFIED PHYSICIAN     Comment 3           POCT I-STAT, CHEM 8     Status: Abnormal   Collection Time    01/09/13  1:19 AM      Result Value Range   Sodium 142  135 - 145 mEq/L   Potassium 4.5  3.5 - 5.1 mEq/L   Chloride 104  96 - 112 mEq/L   BUN 55 (*) 6 - 23 mg/dL   Creatinine, Ser 2.95 (*) 0.50 - 1.35 mg/dL   Glucose, Bld 621 (*) 70 - 99 mg/dL   Calcium, Ion 3.08 (*) 1.12 - 1.23 mmol/L     TCO2 30  0 - 100 mmol/L   Hemoglobin 14.3  13.0 - 17.0 g/dL   HCT 65.7  84.6 - 96.2 %  MRSA PCR SCREENING     Status: None   Collection Time    01/09/13  3:22 AM      Result Value Range   MRSA by PCR NEGATIVE  NEGATIVE  TROPONIN I     Status: Abnormal   Collection Time    01/09/13  4:15 AM      Result Value Range   Troponin I 4.40 (*) <0.30 ng/mL  BASIC METABOLIC PANEL     Status: Abnormal   Collection Time    01/09/13  4:15 AM      Result Value Range   Sodium 142  135 - 145 mEq/L   Potassium 5.8 (*) 3.5 - 5.1 mEq/L  Chloride 99  96 - 112 mEq/L   CO2 25  19 - 32 mEq/L   Glucose, Bld 97  70 - 99 mg/dL   BUN 59 (*) 6 - 23 mg/dL   Creatinine, Ser 1.61 (*) 0.50 - 1.35 mg/dL   Calcium 9.2  8.4 - 09.6 mg/dL   GFR calc non Af Amer 8 (*) >90 mL/min   GFR calc Af Amer 9 (*) >90 mL/min  CBC     Status: Abnormal   Collection Time    01/09/13  4:15 AM      Result Value Range   WBC 9.6  4.0 - 10.5 K/uL   RBC 3.87 (*) 4.22 - 5.81 MIL/uL   Hemoglobin 12.8 (*) 13.0 - 17.0 g/dL   HCT 04.5 (*) 40.9 - 81.1 %   MCV 96.9  78.0 - 100.0 fL   MCH 33.1  26.0 - 34.0 pg   MCHC 34.1  30.0 - 36.0 g/dL   RDW 91.4 (*) 78.2 - 95.6 %   Platelets 155  150 - 400 K/uL    2D Echo: 12-04-2012 Study Conclusions - Left ventricle: The cavity size was moderately dilated. Systolic function was normal. The estimated ejection fraction was in the range of 55% to 65%. Wall motion was normal; there were no regional wall motion abnormalities. Features are consistent with a pseudonormal left ventricular filling pattern, with concomitant abnormal relaxation and increased filling pressure (grade 2 diastolic dysfunction). Doppler parameters are consistent with high ventricular filling pressure. - Aortic valve: Moderately thickened, moderately calcified leaflets. Moderate regurgitation. - Mitral valve: Severely calcified annulus. Severely thickened, moderately calcified leaflets . Mild regurgitation. Valve  area by pressure half-time: 2.34cm^2. - Left atrium: The atrium was mildly dilated. - Atrial septum: No defect or patent foramen ovale was identified. - Pulmonary arteries: Systolic pressure was mildly increased. PA peak pressure: 37mm Hg (S).  Radiology:  Dg Chest Port 1 View 01/09/2013  *RADIOLOGY REPORT*  Clinical Data: Sudden onset chest pain  PORTABLE CHEST - 1 VIEW  Comparison: Chest radiograph 12/04/2012  Findings: Stable enlarged heart silhouette.  There are chronic bronchitic markings.  No effusion, infiltrate, or pneumothorax.  No aggressive osseous lesions.  IMPRESSION: Cardiomegaly and chronic bronchitic change.  No acute findings.   Original Report Authenticated By: Genevive Bi, M.D.    ASSESSMENT: Principal Problem: 1.  Non-ST elevation MI (NSTEMI) - Mr. Hairfield is currently pain-free on heparin and nitrates. His enzymes have elevated indicating a non-ST segment elevation MI. His enzymes were elevated during his admission in March but the current values are higher. Cardiac catheterization is indicated that the patient is reluctant to have this done because the risk of CVA described by Dr. Lynnea Maizes during his recent visit. The patient would feel more comfortable if all cardiac catheterization decision were made by his primary cardiologist. As he is currently pain-free, we will contact Dr. Lynnea Maizes and explore options.  Active Problems: 2.  ESRD on hemodialysis - the patient had dialysis yesterday and currently has an elevated potassium level. He has not had any treatment. Consider Kayexalate 30 g.  3.  Thrombocytopenia - his platelets have been as low as 09/20/2011. Currently they're within normal limits.  4.  Aortic valve disease - See recent echocardiogram above.  Dr. Lynnea Maizes follows him closely for this.  Otherwise, per primary care.    PLAN/DISCUSSION:   Theodore Demark, PA-C 01/09/2013 6:52 AM Beeper [phone]  Patient seen and examined with Theodore Demark, PA-C. We discussed all aspects of  the encounter. I agree with the assessment and plan as stated above.   He has had progressive angina now culminating in NSTEMI. I feel strongly that he needs cath. He is ok with this. I will speak to Dr. Emogene Morgan to see if he prefers to have cath here or at [facility]. Mr. Ake is aware of the risks of cath and would be ok with having it here if [facility] with Dr. Emogene Morgan. AoV disease is stable.   Elisah Parmer,MD 10:43 AM   I spoke with Dr. Emogene Morgan by phone. He agrees with cath and is Ok with it being done here. I discussed this with Mr. Demeyer and he is OK with proceeding. Will arrange for today.  Milta Croson,MD 10:48 AM

## 2013-01-09 NOTE — Progress Notes (Signed)
ANTICOAGULATION CONSULT NOTE   Pharmacy Consult for heparin Indication: chest pain/ACS  Allergies  Allergen Reactions  . Diphenhydramine Itching    Only with IV doses. Tolerates oral.  . Penicillins Other (See Comments)    migraine  . Adhesive (Tape) Rash    Please use paper tape   Patient Measurements: Height: 5\' 10"  (177.8 cm) Weight: 175 lb 7.8 oz (79.6 kg) IBW/kg (Calculated) : 73 Heparin Dosing Weight: 84 kg   Vital Signs: Temp: 98.7 F (37.1 C) (05/06 0715) Temp src: Oral (05/06 0715) BP: 142/48 mmHg (05/06 1000) Pulse Rate: 64 (05/06 1000)  Labs:  Recent Labs  01/09/13 0104 01/09/13 0119 01/09/13 0415 01/09/13 0942 01/09/13 0943  HGB 13.3 14.3 12.8*  --   --   HCT 39.2 42.0 37.5*  --   --   PLT 142*  --  155  --   --   HEPARINUNFRC  --   --   --  0.10*  --   CREATININE  --  6.20* 7.14*  --   --   TROPONINI  --   --  4.40*  --  14.66*    Estimated Creatinine Clearance: 11.8 ml/min (by C-G formula based on Cr of 7.14).   Assessment: 57 yo male with h/o ESRD-HD MWF admitted with chest pain. In ED, patient received heparin 4000 unit IV bolus and was started on IV infusion at 1000 units/hr.   Initial heparin level undetectable, noted plans for cath momentarily. Will increase rate and follow up after cath.  Goal of Therapy:  Heparin level 0.3-0.7 units/ml Monitor platelets by anticoagulation protocol: Yes   Plan:  1. Increase IV heparin at 1200 units/hr. Follow after cath 2. Daily CBC, heparin level    Sheppard Coil PharmD., BCPS Clinical Pharmacist Pager [phone] 01/09/2013 11:40 AM

## 2013-01-09 NOTE — Care Management Note (Signed)
    Page 1 of 1   01/09/2013     11:36:51 AM   CARE MANAGEMENT NOTE 01/09/2013  Patient:  [name], [name]   Account Number:  [id number]  Date Initiated:  01/09/2013  Documentation initiated by:  Junius Creamer  Subjective/Objective Assessment:   adm w mi     Action/Plan:   lives w wife, pcp dr Molly Maduro dough   Anticipated DC Date:     Anticipated DC Plan:        DC Planning Services  CM consult      Choice offered to / List presented to:             Status of service:   Medicare Important Message given?   (If response is "NO", the following Medicare IM given date fields will be blank) Date Medicare IM given:   Date Additional Medicare IM given:    Discharge Disposition:    Per UR Regulation:  Reviewed for med. necessity/level of care/duration of stay  If discussed at Long Length of Stay Meetings, dates discussed:    Comments:

## 2013-01-09 NOTE — Progress Notes (Signed)
ANTICOAGULATION CONSULT NOTE - Initial Consult  Pharmacy Consult for heparin Indication: chest pain/ACS  Allergies  Allergen Reactions  . Diphenhydramine Itching    Only with IV doses. Tolerates oral.  . Penicillins Other (See Comments)    migraine  . Adhesive (Tape) Rash    Please use paper tape    Patient Measurements: Height: 6' 0.05" (183 cm) Weight: 186 lb 1.1 oz (84.4 kg) (Per 12/05/12 documentatin) IBW/kg (Calculated) : 77.71 Heparin Dosing Weight: 84 kg   Vital Signs: Temp: 97.6 F (36.4 C) (05/06 0013) Temp src: Oral (05/06 0013) BP: 142/35 mmHg (05/06 0300) Pulse Rate: 61 (05/06 0300)  Labs:  Recent Labs  01/09/13 0104 01/09/13 0119  HGB 13.3 14.3  HCT 39.2 42.0  PLT 142*  --   CREATININE  --  6.20*    Estimated Creatinine Clearance: 14.4 ml/min (by C-G formula based on Cr of 6.2).   Medical History: Past Medical History  Diagnosis Date  . Multiple sclerosis   . CAD (coronary artery disease)   . Aortic heart valve prolapse   . Peripheral vascular disease   . Sleep apnea     ordered bipap with 3L bled in, but pt will not wear  . Atrial fibrillation   . Multiple sclerosis   . 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  He had a renal Tx from 2008 to 2011.  Gets HD in [place], [place] on MWF schedule.      Medications:  Scheduled:  . [COMPLETED] aspirin  324 mg Oral Once  . aspirin EC  325 mg Oral Daily  . calcium acetate  2,001 mg Oral TID WC  . citalopram  20 mg Oral Daily  . docusate sodium  400 mg Oral BID  . folic acid  1 mg Oral Daily  . gabapentin  300 mg Oral QHS  . [COMPLETED] gi cocktail  30 mL Oral Once  . [COMPLETED] heparin  4,000 Units Intravenous Once  . lanthanum  2,000 mg Oral TID WC  . metoprolol  50 mg Oral Custom  . [START ON 01/10/2013] metoprolol tartrate  25 mg Oral Custom  . metoprolol tartrate  25 mg Oral  Custom  . nitroGLYCERIN  0.5 inch Topical Q6H  . [COMPLETED] ondansetron (ZOFRAN) IV  4 mg Intravenous Once  . pantoprazole  40 mg Oral BID AC  . [START ON 01/10/2013] pneumococcal 23 valent vaccine  0.5 mL Intramuscular Tomorrow-1000  . simvastatin  10 mg Oral BID  . sodium chloride  3 mL Intravenous Q12H  . sodium chloride  3 mL Intravenous Q12H  . [DISCONTINUED] metoprolol  25-50 mg Oral BID  . [DISCONTINUED] omeprazole  20 mg Oral BID    Assessment: 57 yo male with h/o ESRD-HD MWF admitted with chest pain. In ED, patient received heparin 4000 unit IV bolus and was started on IV infusion at 1000 units/hr.   Goal of Therapy:  Heparin level 0.3-0.7 units/ml Monitor platelets by anticoagulation protocol: Yes   Plan:  1. Continue IV heparin at 1000 units/hr.  2. Heparin level in 8 hours 3. Daily CBC, heparin level    Emeline Gins 01/09/2013,3:51 AM

## 2013-01-09 NOTE — Interval H&P Note (Signed)
History and Physical Interval Note:  01/09/2013 12:55 PM  [name]  has presented today for cardiac cath with the diagnosis of NSTEMI/UA. The various methods of treatment have been discussed with the patient and family. After consideration of risks, benefits and other options for treatment, the patient has consented to  Procedure(s): LEFT HEART CATHETERIZATION WITH CORONARY ANGIOGRAM (N/A) as a surgical intervention .  The patient's history has been reviewed, patient examined, no change in status, stable for surgery.  I have reviewed the patient's chart and labs.  Questions were answered to the patient's satisfaction.     Paighton Godette

## 2013-01-09 NOTE — H&P (View-Only) (Signed)
 Referring Physician:  Primary Physician: DOUGH,ROBERT, MD Primary Cardiologist:  Alan L. Hinderliter, MD Associate Professor, Division of Cardiology Clinical Appointment Phone: [phone] - [facility] Fax: [phone] (Dr Tilley saw in consult 12/04/2012 but he does not follow pt.)  Reason for Consultation: NSTEMI  HPI: [name] is a 56 y.o. male with a history of CAD. He has been having episodes of chest pain that were previously and frequent. However, he has had 3 in the last week. They'll resolved with rest in about 30 minutes. He saw his primary cardiologist on April 11 and Dr. Hinderliter was reluctant to cath him because of his multiple medical problems and felt there was a significant risk of cardiac catheterization causing a stroke. 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 Because of this, he has been treated medically.  At 9:30 last night, at rest, he had sudden onset of epigastric pain that radiated up into his chest, both sides of his neck and both sides of his jaw. It also went to both shoulders. It was a 9 or 10/10. It was associated with some mild shortness of breath. He became nauseated and diaphoretic. He vomited once. He does not have nitroglycerin at home. When his symptoms did not resolve, his wife drove him to [facility]. In the emergency room, he was given sublingual nitroglycerin x3, Zofran 4 mg, heparin and started on nitroglycerin paste. He was also given a GI cocktail. He feels the nitroglycerin helped more than anything else. His symptoms resolved and currently is chest pain is a 0/10.  This is the chest pain he has been having but this episode was more severe and did not resolve until he received nitroglycerin. Currently he is resting comfortably.  Review of Systems:     Cardiac Review of Systems: {Y] = yes [ ] = no  Chest Pain [   Y ]  Resting SOB [ N  ] Exertional SOB  [ Y ]  Orthopnea [N  ]   Pedal Edema [  Y  ]    Palpitations [N  ] Syncope  [N  ]   Presyncope [  N ]  General Review of Systems: [Y] = yes [  ]=no Constitional: recent weight change [  ]; anorexia [  ]; fatigue [  ]; nausea [  ]; night sweats [  ]; fever [  ]; or chills [  ];                                                                                                                                          Dental: poor dentition[  ];    Eye : blurred vision [ Y ] episodic; diplopia [   ]; vision changes [  ];  Amaurosis fugax[  ]; Resp: cough [  ];  wheezing[  ];  hemoptysis[  ]; shortness   of breath[  ]; paroxysmal nocturnal dyspnea[  ]; dyspnea on exertion[  ]; or orthopnea[  ];  GI:  gallstones[  ], vomiting[  ];  dysphagia[  ]; melena[  ];  hematochezia [  ]; heartburn[  ];   Hx of  Colonoscopy[  ]; GU: kidney stones [  ]; hematuria[  ];   dysuria [  ];  nocturia[  ];  history of     obstruction [  ];                 Skin: rash, swelling[  ];, hair loss[  ];  peripheral edema[  ];  or itching[  ]; Musculosketetal: myalgias[ y ];  joint swelling[  ];  joint erythema[  ];  joint pain[  ];  back pain[  ];  Heme/Lymph: bruising[  ];  bleeding[  ];  anemia[  ];  Neuro: TIA[  ];  headaches[  ];  stroke[  ];  vertigo[  ];  seizures[  ];   paresthesias[  ];  difficulty walking[  ];  Psych:depression[  ]; anxiety[  ];  Endocrine: diabetes[  ];  thyroid dysfunction[  ];  Immunizations: Flu [  ]; Pneumococcal[  ];  Other:  Past Medical History  Diagnosis Date  . Multiple sclerosis   . CAD (coronary artery disease)   . Aortic heart valve prolapse   . Peripheral vascular disease   . Sleep apnea     ordered bipap with 3L bled in, but pt will not wear  . Atrial fibrillation   . Multiple sclerosis   . 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  He had a renal Tx from 2008 to 2011.  Gets HD in [place], [place] on MWF schedule.       Medications Prior to Admission  Medication Sig Dispense Refill  . acetaminophen (TYLENOL) 500 MG tablet Take 1,000 mg by mouth every 6 (six) hours as needed for pain or fever.      . aspirin EC 81 MG tablet Take 81 mg by mouth daily.      . b complex-vitamin c-folic acid (NEPHRO-VITE) 0.8 MG TABS Take 0.8 mg by mouth daily.       . calcium acetate (PHOSLO) 667 MG capsule Take 2,001 mg by mouth 3 (three) times daily with meals.      . citalopram (CELEXA) 20 MG tablet Take 20 mg by mouth daily.      . docusate sodium (COLACE) 100 MG capsule Take 400 mg by mouth 2 (two) times daily.      . folic acid (FOLVITE) 1 MG tablet Take 1 mg by mouth daily.      . gabapentin (NEURONTIN) 300 MG capsule Take 300 mg by mouth at bedtime.       . lanthanum (FOSRENOL) 1000 MG chewable tablet Chew 2,000 mg by mouth 3 (three) times daily. Takes with meals and snacks      . metoprolol (LOPRESSOR) 50 MG tablet Take 25-50 mg by mouth 2 (two) times daily. 25mg twice daily on dialysis days (M,W,F) Non-diaylsis days: 50mg in the morning 25mg in the evening      . omeprazole (PRILOSEC OTC) 20 MG tablet Take 20 mg by mouth 2 (two) times daily.       . Probiotic Product (PROBIOTIC PO) Take 1 capsule by mouth 2 (two) times   daily.      . simvastatin (ZOCOR) 20 MG tablet Take 10 mg by mouth 2 (two) times daily.         Current Meds . aspirin EC  325 mg Oral Daily  . calcium acetate  2,001 mg Oral TID WC  . citalopram  20 mg Oral Daily  . docusate sodium  400 mg Oral BID  . folic acid  1 mg Oral Daily  . gabapentin  300 mg Oral QHS  . lanthanum  2,000 mg Oral TID WC  . metoprolol  50 mg Oral Custom  . [START ON 01/10/2013] metoprolol tartrate  25 mg Oral Custom  . metoprolol tartrate  25 mg Oral Custom  . nitroGLYCERIN  0.5 inch Topical Q6H  . pantoprazole  40 mg Oral BID AC  . [START ON 01/10/2013] pneumococcal 23 valent vaccine  0.5 mL Intramuscular Tomorrow-1000  . simvastatin  10 mg Oral BID  . sodium  chloride  3 mL Intravenous Q12H  . sodium chloride  3 mL Intravenous Q12H   Infusions:  . heparin 1,000 Units/hr (01/09/13 0251)    Allergies  Allergen Reactions  . Diphenhydramine Itching    Only with IV doses. Tolerates oral.  . Penicillins Other (See Comments)    migraine  . Adhesive (Tape) Rash    Please use paper tape    History   Social History  . Marital Status: Married    Spouse Name: N/A    Number of Children: N/A  . Years of Education: N/A   Occupational History  . Disabled    Social History Main Topics  . Smoking status: Former Smoker -- 2.00 packs/day for 20 years    Types: Cigarettes    Quit date: 08/06/1998  . Smokeless tobacco: Not on file  . Alcohol Use: No  . Drug Use: No  . Sexually Active:    Other Topics Concern  . Not on file   Social History Narrative   No history of premature CAD in either parents or siblings.    History reviewed. No pertinent family history. No family status information on file.    PHYSICAL EXAM: Filed Vitals:   01/09/13 0600  BP: 147/31  Pulse: 60  Temp:   Resp: 20     Intake/Output Summary (Last 24 hours) at 01/09/13 0652 Last data filed at 01/09/13 0600  Gross per 24 hour  Intake   31.5 ml  Output      0 ml  Net   31.5 ml    General:  Well appearing. No respiratory difficulty HEENT: normal Neck: supple. Mild JVD. Carotids 2+ bilat; Bilat bruits (rad of murmur). No lymphadenopathy or thryomegaly appreciated. Cor: PMI nondisplaced. Regular rate & rhythm. No rubs, gallops, 2-3/6 SEM, 2/6 DM. Lungs: clear except for basilar rales Abdomen: soft, nontender, nondistended. No hepatosplenomegaly. No bruits or masses. Good bowel sounds. Extremities: no cyanosis, clubbing, rash, trace LLE edema; Distal pulses 2+ in all extrem except LUE where he has dialysis access Neuro: alert & oriented x 3, cranial nerves grossly intact. moves all 4 extremities w/o difficulty. Affect pleasant.  ECG: SR, minimal inferior ST  elevation, similar to an ECG dated 12/04/2012   Results for orders placed during the hospital encounter of 01/08/13 (from the past 24 hour(s))  PRO B NATRIURETIC PEPTIDE     Status: Abnormal   Collection Time    01/09/13  1:00 AM      Result Value Range   Pro B Natriuretic peptide (BNP)   10160.0 (*) 0 - 125 pg/mL  CBC     Status: Abnormal   Collection Time    01/09/13  1:04 AM      Result Value Range   WBC 10.3  4.0 - 10.5 K/uL   RBC 4.03 (*) 4.22 - 5.81 MIL/uL   Hemoglobin 13.3  13.0 - 17.0 g/dL   HCT 39.2  39.0 - 52.0 %   MCV 97.3  78.0 - 100.0 fL   MCH 33.0  26.0 - 34.0 pg   MCHC 33.9  30.0 - 36.0 g/dL   RDW 16.0 (*) 11.5 - 15.5 %   Platelets 142 (*) 150 - 400 K/uL  MAGNESIUM     Status: Abnormal   Collection Time    01/09/13  1:04 AM      Result Value Range   Magnesium 2.6 (*) 1.5 - 2.5 mg/dL  PHOSPHORUS     Status: Abnormal   Collection Time    01/09/13  1:04 AM      Result Value Range   Phosphorus 6.8 (*) 2.3 - 4.6 mg/dL  HEPATIC FUNCTION PANEL     Status: Abnormal   Collection Time    01/09/13  1:04 AM      Result Value Range   Total Protein 8.2  6.0 - 8.3 g/dL   Albumin 3.6  3.5 - 5.2 g/dL   AST 25  0 - 37 U/L   ALT 15  0 - 53 U/L   Alkaline Phosphatase 87  39 - 117 U/L   Total Bilirubin 0.2 (*) 0.3 - 1.2 mg/dL   Bilirubin, Direct <0.1  0.0 - 0.3 mg/dL   Indirect Bilirubin NOT CALCULATED  0.3 - 0.9 mg/dL  POCT I-STAT TROPONIN I     Status: Abnormal   Collection Time    01/09/13  1:17 AM      Result Value Range   Troponin i, poc 0.47 (*) 0.00 - 0.08 ng/mL   Comment NOTIFIED PHYSICIAN     Comment 3           POCT I-STAT, CHEM 8     Status: Abnormal   Collection Time    01/09/13  1:19 AM      Result Value Range   Sodium 142  135 - 145 mEq/L   Potassium 4.5  3.5 - 5.1 mEq/L   Chloride 104  96 - 112 mEq/L   BUN 55 (*) 6 - 23 mg/dL   Creatinine, Ser 6.20 (*) 0.50 - 1.35 mg/dL   Glucose, Bld 107 (*) 70 - 99 mg/dL   Calcium, Ion 1.03 (*) 1.12 - 1.23 mmol/L     TCO2 30  0 - 100 mmol/L   Hemoglobin 14.3  13.0 - 17.0 g/dL   HCT 42.0  39.0 - 52.0 %  MRSA PCR SCREENING     Status: None   Collection Time    01/09/13  3:22 AM      Result Value Range   MRSA by PCR NEGATIVE  NEGATIVE  TROPONIN I     Status: Abnormal   Collection Time    01/09/13  4:15 AM      Result Value Range   Troponin I 4.40 (*) <0.30 ng/mL  BASIC METABOLIC PANEL     Status: Abnormal   Collection Time    01/09/13  4:15 AM      Result Value Range   Sodium 142  135 - 145 mEq/L   Potassium 5.8 (*) 3.5 - 5.1 mEq/L     Chloride 99  96 - 112 mEq/L   CO2 25  19 - 32 mEq/L   Glucose, Bld 97  70 - 99 mg/dL   BUN 59 (*) 6 - 23 mg/dL   Creatinine, Ser 7.14 (*) 0.50 - 1.35 mg/dL   Calcium 9.2  8.4 - 10.5 mg/dL   GFR calc non Af Amer 8 (*) >90 mL/min   GFR calc Af Amer 9 (*) >90 mL/min  CBC     Status: Abnormal   Collection Time    01/09/13  4:15 AM      Result Value Range   WBC 9.6  4.0 - 10.5 K/uL   RBC 3.87 (*) 4.22 - 5.81 MIL/uL   Hemoglobin 12.8 (*) 13.0 - 17.0 g/dL   HCT 37.5 (*) 39.0 - 52.0 %   MCV 96.9  78.0 - 100.0 fL   MCH 33.1  26.0 - 34.0 pg   MCHC 34.1  30.0 - 36.0 g/dL   RDW 15.9 (*) 11.5 - 15.5 %   Platelets 155  150 - 400 K/uL    2D Echo: 12-04-2012 Study Conclusions - Left ventricle: The cavity size was moderately dilated. Systolic function was normal. The estimated ejection fraction was in the range of 55% to 65%. Wall motion was normal; there were no regional wall motion abnormalities. Features are consistent with a pseudonormal left ventricular filling pattern, with concomitant abnormal relaxation and increased filling pressure (grade 2 diastolic dysfunction). Doppler parameters are consistent with high ventricular filling pressure. - Aortic valve: Moderately thickened, moderately calcified leaflets. Moderate regurgitation. - Mitral valve: Severely calcified annulus. Severely thickened, moderately calcified leaflets . Mild regurgitation. Valve  area by pressure half-time: 2.34cm^2. - Left atrium: The atrium was mildly dilated. - Atrial septum: No defect or patent foramen ovale was identified. - Pulmonary arteries: Systolic pressure was mildly increased. PA peak pressure: 37mm Hg (S).  Radiology:  Dg Chest Port 1 View 01/09/2013  *RADIOLOGY REPORT*  Clinical Data: Sudden onset chest pain  PORTABLE CHEST - 1 VIEW  Comparison: Chest radiograph 12/04/2012  Findings: Stable enlarged heart silhouette.  There are chronic bronchitic markings.  No effusion, infiltrate, or pneumothorax.  No aggressive osseous lesions.  IMPRESSION: Cardiomegaly and chronic bronchitic change.  No acute findings.   Original Report Authenticated By: Stewart Edmunds, M.D.    ASSESSMENT: Principal Problem: 1.  Non-ST elevation MI (NSTEMI) - [name] is currently pain-free on heparin and nitrates. His enzymes have elevated indicating a non-ST segment elevation MI. His enzymes were elevated during his admission in March but the current values are higher. Cardiac catheterization is indicated that the patient is reluctant to have this done because the risk of CVA described by Dr. Hinderlighter during his recent visit. The patient would feel more comfortable if all cardiac catheterization decision were made by his primary cardiologist. As he is currently pain-free, we will contact Dr. Hinderlighter and explore options.  Active Problems: 2.  ESRD on hemodialysis - the patient had dialysis yesterday and currently has an elevated potassium level. He has not had any treatment. Consider Kayexalate 30 g.  3.  Thrombocytopenia - his platelets have been as low as 09/20/2011. Currently they're within normal limits.  4.  Aortic valve disease - See recent echocardiogram above.  Dr. Hinderlighter follows him closely for this.  Otherwise, per primary care.    PLAN/DISCUSSION:   Rhonda Barrett, PA-C 01/09/2013 6:52 AM Beeper [phone]  Patient seen and examined with Rhonda  Barrett, PA-C. We discussed all aspects of   the encounter. I agree with the assessment and plan as stated above.   He has had progressive angina now culminating in NSTEMI. I feel strongly that he needs cath. He is ok with this. I will speak to Dr. Hinderliter to see if he prefers to have cath here or at [facility]. [name] is aware of the risks of cath and would be ok with having it here if Ok with Dr. Hinderliter. AoV disease is stable.   Daniel Bensimhon,MD 10:43 AM   I spoke with Dr. Hinderliter by phone. He agrees with cath and is Ok with it being done here. I discussed this with [name] and he is OK with proceeding. Will arrange for today.  Daniel Bensimhon,MD 10:48 AM   

## 2013-01-10 DIAGNOSIS — Z992 Dependence on renal dialysis: Secondary | ICD-10-CM

## 2013-01-10 DIAGNOSIS — R079 Chest pain, unspecified: Secondary | ICD-10-CM

## 2013-01-10 DIAGNOSIS — N186 End stage renal disease: Secondary | ICD-10-CM

## 2013-01-10 DIAGNOSIS — I214 Non-ST elevation (NSTEMI) myocardial infarction: Secondary | ICD-10-CM

## 2013-01-10 LAB — CBC
MCH: 32 pg (ref 26.0–34.0)
Platelets: 143 10*3/uL — ABNORMAL LOW (ref 150–400)
RBC: 3.87 MIL/uL — ABNORMAL LOW (ref 4.22–5.81)
WBC: 8.6 10*3/uL (ref 4.0–10.5)

## 2013-01-10 LAB — BASIC METABOLIC PANEL
Chloride: 95 mEq/L — ABNORMAL LOW (ref 96–112)
Creatinine, Ser: 9.36 mg/dL — ABNORMAL HIGH (ref 0.50–1.35)
GFR calc Af Amer: 6 mL/min — ABNORMAL LOW (ref >90)
Potassium: 5.7 mEq/L — ABNORMAL HIGH (ref 3.5–5.1)

## 2013-01-10 LAB — GLUCOSE, CAPILLARY: Glucose-Capillary: 119 mg/dL — ABNORMAL HIGH (ref 70–99)

## 2013-01-10 MED FILL — Sodium Chloride IV Soln 0.9%: INTRAVENOUS | Qty: 50 | Status: AC

## 2013-01-10 NOTE — Progress Notes (Signed)
Subjective:  On hd no cos Objective Vital signs in last 24 hours: Filed Vitals:   01/10/13 0800 01/10/13 0830 01/10/13 0900 01/10/13 0927  BP: 119/44 120/50 119/46 115/42  Pulse: 58 67 65 63  Temp:      TempSrc:      Resp: 25 20 21 18   Height:      Weight:      SpO2:       Weight change: -2.1 kg (-4 lb 10.1 oz)  Intake/Output Summary (Last 24 hours) at 01/10/13 0941 Last data filed at 01/09/13 2300  Gross per 24 hour  Intake    598 ml  Output      0 ml  Net    598 ml   Labs: Basic Metabolic Panel:  Recent Labs Lab 01/09/13 0104 01/09/13 0119 01/09/13 0415 01/10/13 0725  NA  --  142 142 134*  K  --  4.5 5.8* 5.7*  CL  --  104 99 95*  CO2  --   --  25 21  GLUCOSE  --  107* 97 85  BUN  --  55* 59* 79*  CREATININE  --  6.20* 7.14* 9.36*  CALCIUM  --   --  9.2 8.5  PHOS 6.8*  --   --   --    Liver Function Tests:  Recent Labs Lab 01/09/13 0104  AST 25  ALT 15  ALKPHOS 87  BILITOT 0.2*  PROT 8.2  ALBUMIN 3.6   No results found for this basename: LIPASE, AMYLASE,  in the last 168 hours No results found for this basename: AMMONIA,  in the last 168 hours CBC:  Recent Labs Lab 01/09/13 0104 01/09/13 0119 01/09/13 0415 01/10/13 0435  WBC 10.3  --  9.6 8.6  HGB 13.3 14.3 12.8* 12.4*  HCT 39.2 42.0 37.5* 37.2*  MCV 97.3  --  96.9 96.1  PLT 142*  --  155 143*   Cardiac Enzymes:  Recent Labs Lab 01/09/13 0415 01/09/13 0943  TROPONINI 4.40* 14.66*   CBG:  Recent Labs Lab 01/09/13 0720 01/09/13 1136 01/09/13 1621 01/09/13 2317  GLUCAP 75 78 92 119*    Iron Studies: No results found for this basename: IRON, TIBC, TRANSFERRIN, FERRITIN,  in the last 72 hours Studies/Results: Dg Chest Port 1 View  01/09/2013  *RADIOLOGY REPORT*  Clinical Data: Sudden onset chest pain  PORTABLE CHEST - 1 VIEW  Comparison: Chest radiograph 12/04/2012  Findings: Stable enlarged heart silhouette.  There are chronic bronchitic markings.  No effusion, infiltrate, or  pneumothorax.  No aggressive osseous lesions.  IMPRESSION: Cardiomegaly and chronic bronchitic change.  No acute findings.   Original Report Authenticated By: Genevive Bi, M.D.    Medications: . sodium chloride 10 mL/hr at 01/09/13 1900   . aspirin  81 mg Oral Daily  . calcium acetate  2,001 mg Oral TID WC  . citalopram  20 mg Oral Daily  . clopidogrel  75 mg Oral Q breakfast  . docusate sodium  400 mg Oral BID  . ferric gluconate (FERRLECIT/NULECIT) IV  62.5 mg Intravenous Q Wed-HD  . folic acid  1 mg Oral Daily  . gabapentin  300 mg Oral QHS  . isosorbide mononitrate  30 mg Oral Daily  . lanthanum  2,000 mg Oral TID WC  . metoprolol  50 mg Oral Custom  . metoprolol tartrate  25 mg Oral Custom  . metoprolol tartrate  25 mg Oral Custom  . multivitamin  1 tablet Oral Daily  .  pantoprazole  40 mg Oral BID AC  . pneumococcal 23 valent vaccine  0.5 mL Intramuscular Tomorrow-1000  . simvastatin  10 mg Oral BID  . sodium chloride  3 mL Intravenous Q12H  . sodium chloride  3 mL Intravenous Q12H   General:alert WM , NAD , appropriate  On HD Heart: RRR, 2/6 sem., And 2/6 dm at apex and rsb , no rub  Lungs: CTA bilaterally  Abdomen: soft, nontender  Extremities: no  pedal edema  Dialysis Access: Patent on HD LFA AVF   Dialysis Orders: [facility] MWF 78kg 1K/2.5Ca bath 4hrs Heparin 8000 L FA AVF 400/800  Zemplar none Epogen none Venofer 100mg  q wed hd   Assessment/Plan  1. NSTEMI/ Triple vessel CAD= noted on card. Cath no focal targets for PCI/ preserved LV Systolic Function, Card Recommends medical tx for now/ "If recurrent Angina Not able to be tx with meds needs CABG and Av replacement with his HO Moderate AI "  / [facility] following now/ also as out pt. At [facility] Dr. Hinderlighter follows 2. ESRD - MWF HD ([facility] ) K 5.7 uses 1.0 k bath as op/ Use 1.0 and 2.0 k today  3. Moderate Aortic Reg. = followed by [facility] 4. Hypertension/volume - edw 78.0 cxr with no  excess vol/ And Am by wt 80. 0 / 1.5 to 2 liters uf/ Need standing wts if possible. BB and Nitrates per card. Current bp 115/42 pre hd and stable so far on hd 5. Anemia - hgb =12.4/ no epo but weekly iron on hd 6. Metabolic bone disease - Phos 6.8on admit and as op=8 / continue renal diet and Phoslo 3 with meals and Fosrenol  / 0 hectorol 7. Nutrition - renavite and renal diet/ last op alb 3.7 8. MS = on no meds 9. OS= no Cpap use 10. Ho AFIB= was on bb only and 81mg  asa before admit   Lenny Pastel, PA-C [facility] Beeper [phone] 01/10/2013,9:41 AM  LOS: 2 days   Patient seen and examined.  Agree with assessment and plan as above. Vinson Moselle  MD [phone] pgr    [phone] cell 01/10/2013, 11:37 AM

## 2013-01-10 NOTE — Progress Notes (Signed)
Report from Night RN. Chart reviewed together. Handoff complete. Pt in HD. Transported by HD staff.

## 2013-01-10 NOTE — Progress Notes (Signed)
CARDIAC REHAB PHASE I   PRE:  Rate/Rhythm: 68SR  BP:  Supine: 123/23  Sitting:   Standing:    SaO2: 91-93%RA  MODE:  Ambulation: 520 ft   POST:  Rate/Rhythm: 83SR  BP:  Supine:   Sitting: 143/32  Standing:    SaO2: 96%RA 1445-1517 Pt walked 520 ft with steady gait and tolerated well. Denied CP. To recliner after walk. Call bell in reach. Gave MI booklet. Will follow up tomorrow.   Luetta Nutting, RN BSN  01/10/2013 3:14 PM

## 2013-01-10 NOTE — Progress Notes (Signed)
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 Emogene Morgan at [facility]. He presented to the emergency department because of chest pain which was typical for him regarding his prior ischemic symptoms. This began while he was at home sitting on the commode. The pain was described as retrosternal pressure-like radiating to the neck and was associated with shortness of breath and nausea and vomiting and diaphoresis. In the ER his EKG demonstrated ST depression in the anterior leads and nonspecific ST-T wave changes in the inferior leads. Subsequent troponin bumped to positive and cardiology on call was consulted and recommended internal medicine admission with cardiology following. Patient had already been started on nitroglycerin patch and IV heparin infusion. Patient did endorse that sublingual nitroglycerin did minimize some of the chest discomfort. Patient had recently been admitted by his primary cardiologist last month for chest pain and had just recently followed up with the cardiologist in the past 2 weeks.  Assessment/Plan:    Non-ST elevation MI (NSTEMI) -TNI increased to >14 -CP free upon our exam earlier this am -Cards following- cath 5/6 c/w multi-vessel dz without any taget lesions that would be amenable to PCI -Plan is to focus on aggressive medical management so will cont ASA, Imdur, statin and BB -if has recurrent angina may need to consider surgical referral- he is to FU with cardiologist at [facility] -Cards rec mobilize and consider dc in 24-48 hrs- need close monitoring for recurrent angina -EF still preserved (see cath report)    CKD (chronic kidney disease)  stage V requiring chronic dialysis/history of Renal transplant failure and rejection -Renal made aware of admission - HD is M-W-F -pt endorses has chronic asymptomatic hyperkalemia    Aortic regurgitation -no s/s CHF    Atrial fibrillation -rate controlled on Lopressor    HTN (hypertension) -BP controlled    Multiple sclerosis -not on meds pre admit    OSA (obstructive sleep apnea) -does not use CPAP at home    Thrombocytopenia -transient and stable   DVT prophylaxis: IV heparin Code Status: Full Family Communication: Patient Disposition Plan: Stepdown-telemetry if bed needed Isolation: None  Consultants: Cardiology  Procedures: Left heart catheterization - see results discussed above  Antibiotics: None  HPI/Subjective: Patient alert and was without chest pain. Verbalized concerns on what comes next- sts this is not first time "this has happened recently" Aware needs FU with primary cardiologist quickly after dc.   Objective: Blood pressure 124/59, pulse 59, temperature 97.9 F (36.6 C), temperature source Oral, resp. rate 20, height 5\' 10"  (1.778 m), weight 78.2 kg (172 lb 6.4 oz), SpO2 94.00%.  Intake/Output Summary (Last 24 hours) at 01/10/13 1409 Last data filed at 01/10/13 1134  Gross per 24 hour  Intake    550 ml  Output   2102 ml  Net  -1552 ml   Exam: General: No acute respiratory distress Lungs: Clear to auscultation bilaterally without wheezes or crackles Cardiovascular: Regular rate and rhythm without murmur gallop or rub normal S1 and S2 Abdomen: Nontender, nondistended, soft, bowel sounds positive, no rebound, no ascites, no appreciable mass Extremities: No significant cyanosis, clubbing, or edema bilateral lower extremities   Data Reviewed: Basic Metabolic Panel:  Recent Labs Lab 01/09/13 0104 01/09/13 0119 01/09/13 0415  01/10/13 0725  NA  --  142 142 134*  K  --  4.5 5.8* 5.7*  CL  --  104 99 95*  CO2  --   --  25 21  GLUCOSE   --  107* 97 85  BUN  --  55* 59* 79*  CREATININE  --  6.20* 7.14* 9.36*  CALCIUM  --   --  9.2 8.5  MG 2.6*  --   --   --   PHOS 6.8*  --   --   --    Liver Function Tests:  Recent Labs Lab 01/09/13 0104  AST 25  ALT 15  ALKPHOS 87  BILITOT 0.2*  PROT 8.2  ALBUMIN 3.6   CBC:  Recent Labs Lab 01/09/13 0104 01/09/13 0119 01/09/13 0415 01/10/13 0435  WBC 10.3  --  9.6 8.6  HGB 13.3 14.3 12.8* 12.4*  HCT 39.2 42.0 37.5* 37.2*  MCV 97.3  --  96.9 96.1  PLT 142*  --  155 143*   Cardiac Enzymes:  Recent Labs Lab 01/09/13 0415 01/09/13 0943  TROPONINI 4.40* 14.66*   BNP (last 3 results)  Recent Labs  01/09/13 0100  PROBNP 10160.0*   CBG:  Recent Labs Lab 01/09/13 0720 01/09/13 1136 01/09/13 1621 01/09/13 2317  GLUCAP 75 78 92 119*    Recent Results (from the past 240 hour(s))  MRSA PCR SCREENING     Status: None   Collection Time    01/09/13  3:22 AM      Result Value Range Status   MRSA by PCR NEGATIVE  NEGATIVE Final   Comment:            The GeneXpert MRSA Assay (FDA     approved for NASAL specimens     only), is one component of a     comprehensive MRSA colonization     surveillance program. It is not     intended to diagnose MRSA     infection nor to guide or     monitor treatment for     MRSA infections.     Studies:  Recent x-ray studies have been reviewed in detail by the Attending Physician  Scheduled Meds:  Reviewed in detail by the Attending Physician   Junious Silk, ANP [facility] Office  [phone] Pager [phone]  On-Call/Text Page:      [email or web address]      password [email or web address]  If 7PM-7AM, please contact night-coverage [email or web address] Password [email or web address] 01/10/2013, 2:09 PM   LOS: 2 days   I have personally examined this patient and reviewed the entire database. I have reviewed the above note, made any necessary editorial changes, and agree with its content.  Lonia Blood, MD [facility]

## 2013-01-10 NOTE — Procedures (Signed)
I was present at this dialysis session. I have reviewed the session itself and made appropriate changes.   Vinson Moselle, MD [employer] 01/10/2013, 11:37 AM

## 2013-01-10 NOTE — Progress Notes (Signed)
Pt returned fm HD. Report from HD RN. Chart reviewed together. Handoff complete.MAR reviewed and admin. Will continue to monitor and advise attending as needed.

## 2013-01-10 NOTE — Progress Notes (Signed)
SUBJECTIVE: Feeling well this am. He is in HD unit. NO chest pain or SOB.   BP 119/44  Pulse 58  Temp(Src) 97.5 F (36.4 C) (Oral)  Resp 25  Ht 5\' 10"  (1.778 m)  Wt 178 lb 2.1 oz (80.8 kg)  BMI 25.56 kg/m2  SpO2 94%  Intake/Output Summary (Last 24 hours) at 01/10/13 0802 Last data filed at 01/09/13 2300  Gross per 24 hour  Intake    608 ml  Output      0 ml  Net    608 ml    PHYSICAL EXAM General: Well developed, well nourished, in no acute distress. Alert and oriented x 3.  Psych:  Good affect, responds appropriately Neck: No JVD. No masses noted.  Lungs: Clear bilaterally with no wheezes or rhonci noted.  Heart: RRR with diastolic murmur noted.  Abdomen: Bowel sounds are present. Soft, non-tender.  Extremities: No lower extremity edema.   LABS: Basic Metabolic Panel:  Recent Labs  14/78/29 0104 01/09/13 0119 01/09/13 0415  NA  --  142 142  K  --  4.5 5.8*  CL  --  104 99  CO2  --   --  25  GLUCOSE  --  107* 97  BUN  --  55* 59*  CREATININE  --  6.20* 7.14*  CALCIUM  --   --  9.2  MG 2.6*  --   --   PHOS 6.8*  --   --    CBC:  Recent Labs  01/09/13 0415 01/10/13 0435  WBC 9.6 8.6  HGB 12.8* 12.4*  HCT 37.5* 37.2*  MCV 96.9 96.1  PLT 155 143*   Cardiac Enzymes:  Recent Labs  01/09/13 0415 01/09/13 0943  TROPONINI 4.40* 14.66*   Current Meds: . aspirin  81 mg Oral Daily  . calcium acetate  2,001 mg Oral TID WC  . citalopram  20 mg Oral Daily  . clopidogrel  75 mg Oral Q breakfast  . docusate sodium  400 mg Oral BID  . ferric gluconate (FERRLECIT/NULECIT) IV  62.5 mg Intravenous Q Wed-HD  . folic acid  1 mg Oral Daily  . gabapentin  300 mg Oral QHS  . isosorbide mononitrate  30 mg Oral Daily  . lanthanum  2,000 mg Oral TID WC  . metoprolol  50 mg Oral Custom  . metoprolol tartrate  25 mg Oral Custom  . metoprolol tartrate  25 mg Oral Custom  . multivitamin  1 tablet Oral Daily  . pantoprazole  40 mg Oral BID AC  . pneumococcal 23  valent vaccine  0.5 mL Intramuscular Tomorrow-1000  . simvastatin  10 mg Oral BID  . sodium chloride  3 mL Intravenous Q12H  . sodium chloride  3 mL Intravenous Q12H     ASSESSMENT AND PLAN:  1. Non-ST elevation MI: Elevated troponin on admission in setting of chest pain. Cardiac cath 01/09/13 with triple vessel CAD with moderately severe disease in the LAD but no culprit lesions noted. He also has moderate disease in the Circumflex.The proximal and mid LAD is heavily calcified and involves a large diagonal branch which also has ostial disease. This is not a favorable area for PCI. See full cath report.  NO recurrence of chest pain overnight. Plan for medical management for now with ASA/Plavix/Imdur/beta blocker. If he has recurrent angina, may have to consider surgical referral for CABG/AV replacement. His cardiac issues are followed at [facility] by Dr. Lynnea Maizes. The case was discussed pre  and post cath with Dr. Lynnea Maizes by Dr. Gala Romney.   - Would recommend 24 more hours hospitalization, transfer to telemetry unit today, ambulation.   2. Moderate aortic valve insufficiency: Stable. Preserved LV systolic function. Followed at [facility].   3. ESRD on hemodialysis: HD today.     [name]  5/7/20148:02 AM

## 2013-01-11 LAB — BASIC METABOLIC PANEL
BUN: 43 mg/dL — ABNORMAL HIGH (ref 6–23)
CO2: 28 mEq/L (ref 19–32)
Chloride: 97 mEq/L (ref 96–112)
Creatinine, Ser: 6.69 mg/dL — ABNORMAL HIGH (ref 0.50–1.35)
GFR calc Af Amer: 10 mL/min — ABNORMAL LOW (ref >90)
Glucose, Bld: 80 mg/dL (ref 70–99)
Potassium: 6.2 mEq/L — ABNORMAL HIGH (ref 3.5–5.1)

## 2013-01-11 LAB — RENAL FUNCTION PANEL
BUN: 44 mg/dL — ABNORMAL HIGH (ref 6–23)
Chloride: 97 mEq/L (ref 96–112)
GFR calc Af Amer: 9 mL/min — ABNORMAL LOW (ref >90)
Glucose, Bld: 99 mg/dL (ref 70–99)
Phosphorus: 5.3 mg/dL — ABNORMAL HIGH (ref 2.3–4.6)
Potassium: 5.2 mEq/L — ABNORMAL HIGH (ref 3.5–5.1)
Sodium: 136 mEq/L (ref 135–145)

## 2013-01-11 LAB — CBC
HCT: 38.1 % — ABNORMAL LOW (ref 39.0–52.0)
Hemoglobin: 12.9 g/dL — ABNORMAL LOW (ref 13.0–17.0)
MCV: 96.5 fL (ref 78.0–100.0)
RBC: 3.95 MIL/uL — ABNORMAL LOW (ref 4.22–5.81)
RDW: 15.8 % — ABNORMAL HIGH (ref 11.5–15.5)
WBC: 7.8 10*3/uL (ref 4.0–10.5)

## 2013-01-11 MED ORDER — RENA-VITE PO TABS: 1.0000 | ORAL_TABLET | Freq: Every day | ORAL | Status: AC

## 2013-01-11 MED ORDER — NITROGLYCERIN 0.4 MG SL SUBL: 0.4000 mg | SUBLINGUAL_TABLET | SUBLINGUAL | Status: DC | PRN

## 2013-01-11 MED ORDER — CLOPIDOGREL BISULFATE 75 MG PO TABS: 75.0000 mg | ORAL_TABLET | Freq: Every day | ORAL | Status: DC

## 2013-01-11 MED ORDER — ISOSORBIDE MONONITRATE ER 30 MG PO TB24: 30.0000 mg | ORAL_TABLET | Freq: Every day | ORAL | Status: DC

## 2013-01-11 NOTE — Discharge Summary (Signed)
Physician Discharge Summary  Ogle Hoeffner ZOX:[id number] DOB: [birth year] DOA: 01/08/2013  PCP: Dina Rich, MD  Admit date: 01/08/2013 Discharge date: 01/11/2013  Time spent: 30 minutes  Recommendations for Outpatient Follow-up:  1. Followup with your cardiologist Dr. Katherine Mantle as soon as possible after discharge-recommendation is to establish appointment within the next one to 2 weeks to discuss your recurrent chest pain and abnormal findings on recent cardiac catheterization in setting of recent non-ST segment elevated MI. 2. Use the new sublingual nitroglycerin tablets as instructed including notifying your cardiologist or primary care physician for recurrent chest pain especially if chest pain not relieved after 3 nitroglycerin tablets.  Discharge Diagnoses:  Active Problems:   Non-ST elevation MI (NSTEMI)  Triple vessel CAD without focal targets for PCI   CKD (chronic kidney disease) stage V requiring chronic dialysis   Aortic regurgitation   Atrial fibrillation   HTN (hypertension)   Renal transplant failure and rejection   Multiple sclerosis   OSA (obstructive sleep apnea)   Thrombocytopenia   Discharge Condition: stable  Diet recommendation: renal, heart healthy  Filed Weights   01/11/13 0306 01/11/13 0801 01/11/13 1057  Weight: 79.9 kg (176 lb 2.4 oz) 79.3 kg (174 lb 13.2 oz) 77.7 kg (171 lb 4.8 oz)    History of present illness:  57 year old patient with complex medical history including 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 Emogene Morgan at [facility]. He presented to the emergency department because of chest pain which was typical for him regarding his prior ischemic symptoms. This began while he was at home sitting on the commode. The pain was described as retrosternal pressure-like radiating to the neck and was associated with shortness of breath and nausea and  vomiting and diaphoresis. In the ER his EKG demonstrated ST depression in the anterior leads and nonspecific ST-T wave changes in the inferior leads. Subsequent troponin bumped to positive and cardiology on call was consulted and recommended internal medicine admission with cardiology following. Patient had already been started on nitroglycerin patch and IV heparin infusion. Patient did endorse that sublingual nitroglycerin did minimize some of the chest discomfort. Patient had recently been admitted by his primary cardiologist last month for chest pain and had just recently followed up with the cardiologist in the past 2 weeks.   Hospital Course:  Non-ST elevation MI (NSTEMI) / Triple vessel CAD -TNI increased to >14 -down to 8 on date of dc -CP free upon our exam earlier this am  - cath 5/6 c/w multi-vessel dz without any taget lesions that would be amenable to PCI  -Plan is to focus on aggressive medical management so will cont ASA, Imdur, statin and BB  -if has recurrent angina may need to consider surgical referral- he is to FU with his primary cardiologist at [facility]  -EF still preserved (see cath report) -given script for SL NTG  CKD (chronic kidney disease) stage V requiring chronic dialysis/history of Renal transplant failure and rejection  -Renal made aware of admission  - HD is M-W-F  -pt endorses has chronic asymptomatic hyperkalemia  Aortic regurgitation  -no s/s CHF  Atrial fibrillation  -rate controlled on Lopressor   HTN (hypertension)  -BP controlled   Multiple sclerosis  -not on meds pre admit   OSA (obstructive sleep apnea)  -does not use CPAP at home   Thrombocytopenia  -transient and stable   Procedures: Left heart catheterization/Dr. Verne Carrow (5/6) Impression:  1. Triple vessel CAD with no focal targets for PCI  2. Moderately severe stenosis mid LAD involving a heavily calcified segment of the LAD at the bifurcation of the large diagonal branch.  IVUS imaging demonstrates that this area is not flow limiting although there is a large plaque burden with circumferential calcium.  3. Moderate disease in the mid RCA and third obtuse marginal branch.  4. Preserved LV systolic function  Recommendations:  At this time, would pursue medical management of his CAD. I do not see a focal target for PCI. His LAD is a heavily calcified vessel and the moderately severe disease is at the bifurcation with the large diagonal branch. This does not appear to be the culprit for his acute event. His LAD does have an aneurysmal segment but there is no ulceration at this area on IVUS imaging. If he has recurrent angina that cannot be treated with medical therapy, will need to consider bypass surgery and also consider replacement of aortic valve at that time (known to have moderate AI by echo 12/04/12). He has been started on Plavix. Continue ASA. Continue beta blocker, statin. Will start long acting nitrate.   Consultations:  Cardiology/Dr. Clifton James  Nephrology/Dr. Schertz  Discharge Exam: Filed Vitals:   01/11/13 1000 01/11/13 1030 01/11/13 1057 01/11/13 1200  BP: 133/35 131/38 148/38   Pulse: 80 68 81   Temp:   98.1 F (36.7 C) 98.9 F (37.2 C)  TempSrc:   Oral Oral  Resp: 19 7 7    Height:      Weight:   77.7 kg (171 lb 4.8 oz)   SpO2:       General: No acute respiratory distress  Lungs: Clear to auscultation bilaterally without wheezes or crackles  Cardiovascular: Regular rate and rhythm without murmur gallop or rub normal S1 and S2  Abdomen: Nontender, nondistended, soft, bowel sounds positive, no rebound, no ascites, no appreciable mass  Extremities: No significant cyanosis, clubbing, or edema bilateral lower extremities   Discharge Instructions      Discharge Orders   Future Orders Complete By Expires     Call MD for:  extreme fatigue  As directed     Call MD for:  persistant dizziness or light-headedness  As directed     Call MD for:   As directed     Comments:      ONGOING CHEST PAIN    Diet - low sodium heart healthy  As directed     Diet general  As directed     Comments:      RENAL DIET    Increase activity slowly  As directed         Medication List    TAKE these medications       acetaminophen 500 MG tablet  Commonly known as:  TYLENOL  Take 1,000 mg by mouth every 6 (six) hours as needed for pain or fever.     aspirin EC 81 MG tablet  Take 81 mg by mouth daily.     calcium acetate 667 MG capsule  Commonly known as:  PHOSLO  Take 2,001 mg by mouth 3 (three) times daily with meals.     citalopram 20 MG tablet  Commonly known as:  CELEXA  Take 20 mg by mouth daily.     clopidogrel 75 MG tablet  Commonly known as:  PLAVIX  Take 1 tablet (75 mg total) by mouth daily with breakfast.     docusate sodium 100 MG capsule  Commonly known as:  COLACE  Take 400 mg by mouth 2 (two) times daily.     folic acid 1 MG tablet  Commonly known as:  FOLVITE  Take 1 mg by mouth daily.     gabapentin 300 MG capsule  Commonly known as:  NEURONTIN  Take 300 mg by mouth at bedtime.     isosorbide mononitrate 30 MG 24 hr tablet  Commonly known as:  IMDUR  Take 1 tablet (30 mg total) by mouth daily.     lanthanum 1000 MG chewable tablet  Commonly known as:  FOSRENOL  Chew 2,000 mg by mouth 3 (three) times daily. Takes with meals and snacks     metoprolol 50 MG tablet  Commonly known as:  LOPRESSOR  Take 25-50 mg by mouth 2 (two) times daily. 25mg  twice daily on dialysis days (M,W,F)  Non-diaylsis days:  50mg  in the morning 25mg  in the evening     multivitamin Tabs tablet  Take 1 tablet by mouth daily.     b complex-vitamin c-folic acid 0.8 MG Tabs  Take 0.8 mg by mouth daily.     nitroGLYCERIN 0.4 MG SL tablet  Commonly known as:  NITROSTAT  Place 1 tablet (0.4 mg total) under the tongue every 5 (five) minutes as needed for chest pain (Take one tabe every 5-10 minutes for chest pain. Once you have  taken three tabs you need to notify your MD or go to ER if chest pain not gone.).     omeprazole 20 MG tablet  Commonly known as:  PRILOSEC OTC  Take 20 mg by mouth 2 (two) times daily.     PROBIOTIC PO  Take 1 capsule by mouth 2 (two) times daily.     simvastatin 20 MG tablet  Commonly known as:  ZOCOR  Take 10 mg by mouth 2 (two) times daily.       Allergies  Allergen Reactions  . Diphenhydramine Itching    Only with IV doses. Tolerates oral.  . Penicillins Other (See Comments)    migraine  . Adhesive (Tape) Rash    Please use paper tape   Follow-up Information   Please follow up.   Contact information:   Call Dr Katherine Mantle at [facility] to arrange visit- recommend make appointment within next 1-2 weeks regarding your chest pain and recent cardiac catheterization results.      Follow up with Dina Rich, MD. (keep previous appointment)    Contact information:   [address] [place] [address] [phone]       Please follow up.   Contact information:   Keep usual dialysis appointments       The results of significant diagnostics from this hospitalization (including imaging, microbiology, ancillary and laboratory) are listed below for reference.    Significant Diagnostic Studies: Dg Chest Port 1 View  01/09/2013  *RADIOLOGY REPORT*  Clinical Data: Sudden onset chest pain  PORTABLE CHEST - 1 VIEW  Comparison: Chest radiograph 12/04/2012  Findings: Stable enlarged heart silhouette.  There are chronic bronchitic markings.  No effusion, infiltrate, or pneumothorax.  No aggressive osseous lesions.  IMPRESSION: Cardiomegaly and chronic bronchitic change.  No acute findings.   Original Report Authenticated By: Genevive Bi, M.D.     Microbiology: Recent Results (from the past 240 hour(s))  MRSA PCR SCREENING     Status: None   Collection Time    01/09/13  3:22 AM      Result Value Range Status   MRSA by  PCR NEGATIVE  NEGATIVE Final   Comment:            The  GeneXpert MRSA Assay (FDA     approved for NASAL specimens     only), is one component of a     comprehensive MRSA colonization     surveillance program. It is not     intended to diagnose MRSA     infection nor to guide or     monitor treatment for     MRSA infections.     Labs: Basic Metabolic Panel:  Recent Labs Lab 01/09/13 0104 01/09/13 0119 01/09/13 0415 01/10/13 0725 01/11/13 0500 01/11/13 0841  NA  --  142 142 134* 136 136  K  --  4.5 5.8* 5.7* 6.2* 5.2*  CL  --  104 99 95* 97 97  CO2  --   --  25 21 28 26   GLUCOSE  --  107* 97 85 80 99  BUN  --  55* 59* 79* 43* 44*  CREATININE  --  6.20* 7.14* 9.36* 6.69* 6.89*  CALCIUM  --   --  9.2 8.5 8.6 8.4  MG 2.6*  --   --   --   --   --   PHOS 6.8*  --   --   --   --  5.3*   Liver Function Tests:  Recent Labs Lab 01/09/13 0104 01/11/13 0841  AST 25  --   ALT 15  --   ALKPHOS 87  --   BILITOT 0.2*  --   PROT 8.2  --   ALBUMIN 3.6 3.2*   No results found for this basename: LIPASE, AMYLASE,  in the last 168 hours No results found for this basename: AMMONIA,  in the last 168 hours CBC:  Recent Labs Lab 01/09/13 0104 01/09/13 0119 01/09/13 0415 01/10/13 0435 01/11/13 0500  WBC 10.3  --  9.6 8.6 7.8  HGB 13.3 14.3 12.8* 12.4* 12.9*  HCT 39.2 42.0 37.5* 37.2* 38.1*  MCV 97.3  --  96.9 96.1 96.5  PLT 142*  --  155 143* 149*   Cardiac Enzymes:  Recent Labs Lab 01/09/13 0415 01/09/13 0943 01/11/13 0500  TROPONINI 4.40* 14.66* 8.09*   BNP: BNP (last 3 results)  Recent Labs  01/09/13 0100  PROBNP 10160.0*   CBG:  Recent Labs Lab 01/09/13 0720 01/09/13 1136 01/09/13 1621 01/09/13 2317 01/10/13 0559  GLUCAP 75 78 92 119* 86       Signed:  ELLIS,ALLISON L.ANP  [facility] [phone] 01/11/2013, 2:20 PM   I have examined the patient, reviewed the chart and modified the above note which I agree with.   Dream Nodal,MD [phone] 01/11/2013, 4:55 PM

## 2013-01-11 NOTE — Procedures (Signed)
I was present at this dialysis session. I have reviewed the session itself and made appropriate changes.   Vinson Moselle, MD [employer] 01/11/2013, 11:39 AM

## 2013-01-11 NOTE — Progress Notes (Signed)
[id number] Pt just back from dialysis. MI education completed with pt. Referred [facility] to [facility] as [facility] on renal diet. Discussed CRP 2 and pt gave permission to send letter of interest to [facility]. If they have program on nondialysis days he may consider going. He will discuss with Dr. Serita Butcher. Luetta Nutting RNBSN

## 2013-01-11 NOTE — Progress Notes (Signed)
Reviewed discharge instructions with patient and family. Verbally understood. Got the release form sent to our medical records to send information to the cardiologist in [facility]. Patient escorted via wheelchair.   Iam Lipson, Charlaine Dalton RN

## 2013-01-11 NOTE — Progress Notes (Signed)
Subjective:  On hd no cos Objective Vital signs in last 24 hours: Filed Vitals:   01/10/13 2100 01/10/13 2108 01/10/13 2311 01/11/13 0306  BP: 123/39 123/39 131/34 130/51  Pulse:  76 68 65  Temp:    98.1 F (36.7 C)  TempSrc:   Oral Oral  Resp: 18  23 24   Height:      Weight:    79.9 kg (176 lb 2.4 oz)  SpO2:   95% 94%   Weight change: -4.1 kg (-9 lb 0.6 oz)  Intake/Output Summary (Last 24 hours) at 01/11/13 0751 Last data filed at 01/10/13 2100  Gross per 24 hour  Intake    440 ml  Output   2102 ml  Net  -1662 ml   Labs: Basic Metabolic Panel:  Recent Labs Lab 01/09/13 0104  01/09/13 0415 01/10/13 0725 01/11/13 0500  NA  --   < > 142 134* 136  K  --   < > 5.8* 5.7* 6.2*  CL  --   < > 99 95* 97  CO2  --   --  25 21 28   GLUCOSE  --   < > 97 85 80  BUN  --   < > 59* 79* 43*  CREATININE  --   < > 7.14* 9.36* 6.69*  CALCIUM  --   --  9.2 8.5 8.6  PHOS 6.8*  --   --   --   --   < > = values in this interval not displayed. Liver Function Tests:  Recent Labs Lab 01/09/13 0104  AST 25  ALT 15  ALKPHOS 87  BILITOT 0.2*  PROT 8.2  ALBUMIN 3.6   No results found for this basename: LIPASE, AMYLASE,  in the last 168 hours No results found for this basename: AMMONIA,  in the last 168 hours CBC:  Recent Labs Lab 01/09/13 0104  01/09/13 0415 01/10/13 0435 01/11/13 0500  WBC 10.3  --  9.6 8.6 7.8  HGB 13.3  < > 12.8* 12.4* 12.9*  HCT 39.2  < > 37.5* 37.2* 38.1*  MCV 97.3  --  96.9 96.1 96.5  PLT 142*  --  155 143* 149*  < > = values in this interval not displayed. Cardiac Enzymes:  Recent Labs Lab 01/09/13 0415 01/09/13 0943 01/11/13 0500  TROPONINI 4.40* 14.66* 8.09*   CBG:  Recent Labs Lab 01/09/13 0720 01/09/13 1136 01/09/13 1621 01/09/13 2317 01/10/13 0559  GLUCAP 75 78 92 119* 86    Iron Studies: No results found for this basename: IRON, TIBC, TRANSFERRIN, FERRITIN,  in the last 72 hours Studies/Results: No results  found. Medications: . sodium chloride 10 mL/hr at 01/09/13 1900   . aspirin  81 mg Oral Daily  . calcium acetate  2,001 mg Oral TID WC  . citalopram  20 mg Oral Daily  . clopidogrel  75 mg Oral Q breakfast  . docusate sodium  400 mg Oral BID  . ferric gluconate (FERRLECIT/NULECIT) IV  62.5 mg Intravenous Q Wed-HD  . folic acid  1 mg Oral Daily  . gabapentin  300 mg Oral QHS  . isosorbide mononitrate  30 mg Oral Daily  . metoprolol  50 mg Oral Custom  . metoprolol tartrate  25 mg Oral Custom  . metoprolol tartrate  25 mg Oral Custom  . multivitamin  1 tablet Oral Daily  . pantoprazole  40 mg Oral BID AC  . simvastatin  10 mg Oral BID  . sodium chloride  3 mL Intravenous Q12H  . sodium chloride  3 mL Intravenous Q12H   General:alert WM , NAD , appropriate  On HD Heart: RRR, 2/6 sem., And 2/6 dm at apex and rsb , no rub  Lungs: CTA bilaterally  Abdomen: soft, nontender  Extremities: no  pedal edema  Dialysis Access: Patent on HD LFA AVF, bruit is short, discontinuous   Dialysis Orders: [facility] MWF 78kg 1K/2.5Ca bath 4hrs Heparin 8000 L FA AVF 400/800  Zemplar none Epogen none Venofer 100mg  q wed hd   Assessment/Plan  1. Hyperkalemia- did not improve with HD yest. Plan HD this am, repeat K going on, ?access stenosis on exam. Check post HD 2 hr K, if OK may be OK to discharge home. Will arrange for outpt fistulogram next week 2. NSTEMI/ Triple vessel CAD= noted on card. Cath no focal targets for PCI/ preserved LV Systolic Function, Card Recommends medical tx for now/ "If recurrent Angina Not able to be tx with meds needs CABG and Av replacement with his HO Moderate AI "  /  Cardiology following now/ also as out pt. At [facility] Dr. Hinderlighter follows 3. ESRD, usual HD MWF  4. Moderate Aortic Reg. = followed by [facility] 5. Hypertension/volume - edw 78.0 cxr with no excess vol/ And Am by wt 80. 0 / 1.5 to 2 liters uf/ Need standing wts if possible. BB and Nitrates  per card. Current bp 115/42 pre hd and stable so far on hd 6. Anemia - hgb =12.4/ no epo but weekly iron on hd 7. Metabolic bone disease - Phos 6.8on admit and as op=8 / continue renal diet and Phoslo 3 with meals and Fosrenol  / 0 hectorol 8. Nutrition - renavite and renal diet/ last op alb 3.7 9. MS = on no meds 10. OS= no Cpap use 11. Ho AFIB= was on bb only and 81mg  asa before admit  Cameron Moselle  MD [phone] pgr    [phone] cell 01/11/2013, 7:54 AM

## 2013-01-11 NOTE — Progress Notes (Signed)
Patient ID: [name], male   DOB: Apr 12, 1956, 57 y.o.   MRN: [id number] Cardiologist: [facility] and [facility]    SUBJECTIVE: Feeling well this am. He is in HD unit. NO chest pain or SOB.  Discussed care with wife  BP 133/35  Pulse 80  Temp(Src) 98.1 F (36.7 C) (Oral)  Resp 19  Ht 5\' 10"  (1.778 m)  Wt 174 lb 13.2 oz (79.3 kg)  BMI 25.08 kg/m2  SpO2 96%  Intake/Output Summary (Last 24 hours) at 01/11/13 1024 Last data filed at 01/10/13 2100  Gross per 24 hour  Intake    440 ml  Output   2102 ml  Net  -1662 ml    PHYSICAL EXAM General: Well developed, well nourished, in no acute distress. Alert and oriented x 3.  Psych:  Good affect, responds appropriately Neck: No JVD. No masses noted.  Lungs: Clear bilaterally with no wheezes or rhonci noted.  Heart: RRR with diastolic murmur noted.  Abdomen: Bowel sounds are present. Soft, non-tender.  Extremities: No lower extremity edema.   LABS: Basic Metabolic Panel:  Recent Labs  40/98/11 0104  01/11/13 0500 01/11/13 0841  NA  --   < > 136 136  K  --   < > 6.2* 5.2*  CL  --   < > 97 97  CO2  --   < > 28 26  GLUCOSE  --   < > 80 99  BUN  --   < > 43* 44*  CREATININE  --   < > 6.69* 6.89*  CALCIUM  --   < > 8.6 8.4  MG 2.6*  --   --   --   PHOS 6.8*  --   --  5.3*  < > = values in this interval not displayed. CBC:  Recent Labs  01/10/13 0435 01/11/13 0500  WBC 8.6 7.8  HGB 12.4* 12.9*  HCT 37.2* 38.1*  MCV 96.1 96.5  PLT 143* 149*   Cardiac Enzymes:  Recent Labs  01/09/13 0415 01/09/13 0943 01/11/13 0500  TROPONINI 4.40* 14.66* 8.09*   Current Meds: . aspirin  81 mg Oral Daily  . calcium acetate  2,001 mg Oral TID WC  . citalopram  20 mg Oral Daily  . clopidogrel  75 mg Oral Q breakfast  . docusate sodium  400 mg Oral BID  . ferric gluconate (FERRLECIT/NULECIT) IV  62.5 mg Intravenous Q Wed-HD  . folic acid  1 mg Oral Daily  . gabapentin  300 mg Oral QHS  . isosorbide mononitrate  30 mg Oral  Daily  . metoprolol  50 mg Oral Custom  . metoprolol tartrate  25 mg Oral Custom  . metoprolol tartrate  25 mg Oral Custom  . multivitamin  1 tablet Oral Daily  . pantoprazole  40 mg Oral BID AC  . simvastatin  10 mg Oral BID  . sodium chloride  3 mL Intravenous Q12H  . sodium chloride  3 mL Intravenous Q12H     ASSESSMENT AND PLAN:  1. Non-ST elevation MI: Elevated troponin on admission in setting of chest pain. Cardiac cath 01/09/13 with triple vessel CAD with moderately severe disease in the LAD but no culprit lesions noted. He also has moderate disease in the Circumflex.The proximal and mid LAD is heavily calcified and involves a large diagonal branch which also has ostial disease. This is not a favorable area for PCI. See full cath report.  Plan for medical management for now with ASA/Plavix/Imdur/beta blocker.  If he has recurrent angina, may have to consider surgical referral for CABG/AV replacement. His cardiac issues are followed at [facility] by Dr. Lynnea Maizes who has been informed of cath results and issues   Ok to discharge with f/u at [facility]  2. Moderate aortic valve insufficiency: Stable. Preserved LV systolic function. Followed at [facility].   3. ESRD on hemodialysis: HD today.  K was elevated  Will sign of    [name]  5/8/201410:24 AM

## 2013-01-11 NOTE — Progress Notes (Signed)
Repeat K+ preHD was only 5.2, it was 6.2 earlier this am  .  I am shortening HD to 2.5 hours and cancelling post HD K+ level. He may go home after HD.   Cameron Moselle  MD [phone] pgr    [phone] cell 01/11/2013, 11:31 AM

## 2013-01-11 NOTE — Progress Notes (Signed)
Md notified about pt's potassium 6.2.  No new orders at this time.  [name]

## 2013-01-15 ENCOUNTER — Other Ambulatory Visit: Payer: Self-pay | Admitting: *Deleted

## 2013-01-15 MED ORDER — NITROGLYCERIN 0.4 MG SL SUBL: 0.4000 mg | SUBLINGUAL_TABLET | SUBLINGUAL | Status: DC | PRN

## 2013-01-15 MED ORDER — CLOPIDOGREL BISULFATE 75 MG PO TABS: 75.0000 mg | ORAL_TABLET | Freq: Every day | ORAL | Status: DC

## 2013-01-15 MED ORDER — ISOSORBIDE MONONITRATE ER 30 MG PO TB24: 30.0000 mg | ORAL_TABLET | Freq: Every day | ORAL | Status: DC

## 2013-03-12 ENCOUNTER — Telehealth: Payer: Self-pay | Admitting: Cardiovascular Disease

## 2013-03-12 NOTE — Telephone Encounter (Signed)
Walk In pt Form " Insurance Reimbursement" paper Dropped Off gave to Pat/McAlhany 03/12/13/KM

## 2013-04-06 DIAGNOSIS — I358 Other nonrheumatic aortic valve disorders: Secondary | ICD-10-CM

## 2013-04-06 DIAGNOSIS — Z951 Presence of aortocoronary bypass graft: Secondary | ICD-10-CM

## 2013-04-06 DIAGNOSIS — I2510 Atherosclerotic heart disease of native coronary artery without angina pectoris: Secondary | ICD-10-CM

## 2013-04-06 HISTORY — DX: Presence of aortocoronary bypass graft: Z95.1

## 2013-04-06 HISTORY — DX: Atherosclerotic heart disease of native coronary artery without angina pectoris: I25.10

## 2013-04-06 HISTORY — DX: Other nonrheumatic aortic valve disorders: I35.8

## 2013-04-13 DIAGNOSIS — I330 Acute and subacute infective endocarditis: Secondary | ICD-10-CM | POA: Insufficient documentation

## 2013-04-19 DIAGNOSIS — I5189 Other ill-defined heart diseases: Secondary | ICD-10-CM | POA: Insufficient documentation

## 2013-04-20 DIAGNOSIS — K056 Periodontal disease, unspecified: Secondary | ICD-10-CM | POA: Insufficient documentation

## 2013-04-20 DIAGNOSIS — N032 Chronic nephritic syndrome with diffuse membranous glomerulonephritis: Secondary | ICD-10-CM | POA: Insufficient documentation

## 2013-04-20 DIAGNOSIS — N052 Unspecified nephritic syndrome with diffuse membranous glomerulonephritis: Secondary | ICD-10-CM | POA: Insufficient documentation

## 2013-04-20 DIAGNOSIS — K5909 Other constipation: Secondary | ICD-10-CM | POA: Insufficient documentation

## 2013-05-01 DIAGNOSIS — N179 Acute kidney failure, unspecified: Secondary | ICD-10-CM | POA: Insufficient documentation

## 2013-05-01 DIAGNOSIS — E872 Acidosis: Secondary | ICD-10-CM | POA: Insufficient documentation

## 2013-08-10 DIAGNOSIS — Z8679 Personal history of other diseases of the circulatory system: Secondary | ICD-10-CM | POA: Insufficient documentation

## 2013-09-07 DIAGNOSIS — N186 End stage renal disease: Secondary | ICD-10-CM | POA: Diagnosis not present

## 2013-09-07 DIAGNOSIS — E875 Hyperkalemia: Secondary | ICD-10-CM | POA: Diagnosis not present

## 2013-09-20 DIAGNOSIS — B354 Tinea corporis: Secondary | ICD-10-CM | POA: Diagnosis not present

## 2013-09-20 DIAGNOSIS — L570 Actinic keratosis: Secondary | ICD-10-CM | POA: Diagnosis not present

## 2013-09-20 DIAGNOSIS — B353 Tinea pedis: Secondary | ICD-10-CM | POA: Diagnosis not present

## 2013-09-20 DIAGNOSIS — L981 Factitial dermatitis: Secondary | ICD-10-CM | POA: Diagnosis not present

## 2013-09-25 DIAGNOSIS — G4733 Obstructive sleep apnea (adult) (pediatric): Secondary | ICD-10-CM | POA: Diagnosis not present

## 2013-10-01 DIAGNOSIS — E875 Hyperkalemia: Secondary | ICD-10-CM | POA: Diagnosis not present

## 2013-10-06 DIAGNOSIS — N186 End stage renal disease: Secondary | ICD-10-CM | POA: Diagnosis not present

## 2013-10-08 DIAGNOSIS — E875 Hyperkalemia: Secondary | ICD-10-CM | POA: Diagnosis not present

## 2013-10-08 DIAGNOSIS — N186 End stage renal disease: Secondary | ICD-10-CM | POA: Diagnosis not present

## 2013-11-03 DIAGNOSIS — N186 End stage renal disease: Secondary | ICD-10-CM | POA: Diagnosis not present

## 2013-11-05 DIAGNOSIS — N186 End stage renal disease: Secondary | ICD-10-CM | POA: Diagnosis not present

## 2013-11-05 DIAGNOSIS — E875 Hyperkalemia: Secondary | ICD-10-CM | POA: Diagnosis not present

## 2013-11-09 DIAGNOSIS — I4891 Unspecified atrial fibrillation: Secondary | ICD-10-CM | POA: Diagnosis not present

## 2013-11-09 DIAGNOSIS — I2510 Atherosclerotic heart disease of native coronary artery without angina pectoris: Secondary | ICD-10-CM | POA: Diagnosis not present

## 2013-11-09 DIAGNOSIS — Z954 Presence of other heart-valve replacement: Secondary | ICD-10-CM | POA: Diagnosis not present

## 2013-11-26 DIAGNOSIS — L820 Inflamed seborrheic keratosis: Secondary | ICD-10-CM | POA: Diagnosis not present

## 2013-11-26 DIAGNOSIS — L578 Other skin changes due to chronic exposure to nonionizing radiation: Secondary | ICD-10-CM | POA: Diagnosis not present

## 2013-11-26 DIAGNOSIS — D235 Other benign neoplasm of skin of trunk: Secondary | ICD-10-CM | POA: Diagnosis not present

## 2013-11-26 DIAGNOSIS — L821 Other seborrheic keratosis: Secondary | ICD-10-CM | POA: Diagnosis not present

## 2013-12-04 DIAGNOSIS — N186 End stage renal disease: Secondary | ICD-10-CM | POA: Diagnosis not present

## 2013-12-05 DIAGNOSIS — N186 End stage renal disease: Secondary | ICD-10-CM | POA: Diagnosis not present

## 2013-12-05 DIAGNOSIS — D509 Iron deficiency anemia, unspecified: Secondary | ICD-10-CM | POA: Diagnosis not present

## 2013-12-05 DIAGNOSIS — E875 Hyperkalemia: Secondary | ICD-10-CM | POA: Diagnosis not present

## 2014-01-03 DIAGNOSIS — N186 End stage renal disease: Secondary | ICD-10-CM | POA: Diagnosis not present

## 2014-01-04 DIAGNOSIS — D509 Iron deficiency anemia, unspecified: Secondary | ICD-10-CM | POA: Diagnosis not present

## 2014-01-04 DIAGNOSIS — N186 End stage renal disease: Secondary | ICD-10-CM | POA: Diagnosis not present

## 2014-01-04 DIAGNOSIS — E875 Hyperkalemia: Secondary | ICD-10-CM | POA: Diagnosis not present

## 2014-01-31 DIAGNOSIS — R059 Cough, unspecified: Secondary | ICD-10-CM | POA: Diagnosis not present

## 2014-01-31 DIAGNOSIS — I359 Nonrheumatic aortic valve disorder, unspecified: Secondary | ICD-10-CM | POA: Diagnosis not present

## 2014-01-31 DIAGNOSIS — Z951 Presence of aortocoronary bypass graft: Secondary | ICD-10-CM | POA: Diagnosis not present

## 2014-01-31 DIAGNOSIS — I059 Rheumatic mitral valve disease, unspecified: Secondary | ICD-10-CM | POA: Diagnosis not present

## 2014-01-31 DIAGNOSIS — N189 Chronic kidney disease, unspecified: Secondary | ICD-10-CM | POA: Diagnosis not present

## 2014-01-31 DIAGNOSIS — Z09 Encounter for follow-up examination after completed treatment for conditions other than malignant neoplasm: Secondary | ICD-10-CM | POA: Diagnosis not present

## 2014-01-31 DIAGNOSIS — I2789 Other specified pulmonary heart diseases: Secondary | ICD-10-CM | POA: Diagnosis not present

## 2014-01-31 DIAGNOSIS — I517 Cardiomegaly: Secondary | ICD-10-CM | POA: Diagnosis not present

## 2014-01-31 DIAGNOSIS — Z952 Presence of prosthetic heart valve: Secondary | ICD-10-CM | POA: Diagnosis not present

## 2014-02-03 DIAGNOSIS — N186 End stage renal disease: Secondary | ICD-10-CM | POA: Diagnosis not present

## 2014-02-04 DIAGNOSIS — N186 End stage renal disease: Secondary | ICD-10-CM | POA: Diagnosis not present

## 2014-02-04 DIAGNOSIS — E875 Hyperkalemia: Secondary | ICD-10-CM | POA: Diagnosis not present

## 2014-02-04 DIAGNOSIS — M6281 Muscle weakness (generalized): Secondary | ICD-10-CM | POA: Diagnosis not present

## 2014-02-04 DIAGNOSIS — D509 Iron deficiency anemia, unspecified: Secondary | ICD-10-CM | POA: Diagnosis not present

## 2014-02-12 DIAGNOSIS — J029 Acute pharyngitis, unspecified: Secondary | ICD-10-CM | POA: Diagnosis not present

## 2014-02-12 DIAGNOSIS — T148 Other injury of unspecified body region: Secondary | ICD-10-CM | POA: Diagnosis not present

## 2014-02-12 DIAGNOSIS — W57XXXA Bitten or stung by nonvenomous insect and other nonvenomous arthropods, initial encounter: Secondary | ICD-10-CM | POA: Diagnosis not present

## 2014-03-05 DIAGNOSIS — N186 End stage renal disease: Secondary | ICD-10-CM | POA: Diagnosis not present

## 2014-03-06 DIAGNOSIS — D509 Iron deficiency anemia, unspecified: Secondary | ICD-10-CM | POA: Diagnosis not present

## 2014-03-06 DIAGNOSIS — N186 End stage renal disease: Secondary | ICD-10-CM | POA: Diagnosis not present

## 2014-03-06 DIAGNOSIS — E875 Hyperkalemia: Secondary | ICD-10-CM | POA: Diagnosis not present

## 2014-03-22 DIAGNOSIS — I4891 Unspecified atrial fibrillation: Secondary | ICD-10-CM | POA: Diagnosis not present

## 2014-03-22 DIAGNOSIS — I2510 Atherosclerotic heart disease of native coronary artery without angina pectoris: Secondary | ICD-10-CM | POA: Diagnosis not present

## 2014-03-22 DIAGNOSIS — Z954 Presence of other heart-valve replacement: Secondary | ICD-10-CM | POA: Diagnosis not present

## 2014-04-05 DIAGNOSIS — N186 End stage renal disease: Secondary | ICD-10-CM | POA: Diagnosis not present

## 2014-04-08 DIAGNOSIS — D509 Iron deficiency anemia, unspecified: Secondary | ICD-10-CM | POA: Diagnosis not present

## 2014-04-08 DIAGNOSIS — N186 End stage renal disease: Secondary | ICD-10-CM | POA: Diagnosis not present

## 2014-04-08 DIAGNOSIS — E875 Hyperkalemia: Secondary | ICD-10-CM | POA: Diagnosis not present

## 2014-04-10 DIAGNOSIS — N186 End stage renal disease: Secondary | ICD-10-CM | POA: Diagnosis not present

## 2014-04-10 DIAGNOSIS — D509 Iron deficiency anemia, unspecified: Secondary | ICD-10-CM | POA: Diagnosis not present

## 2014-04-10 DIAGNOSIS — E875 Hyperkalemia: Secondary | ICD-10-CM | POA: Diagnosis not present

## 2014-04-12 DIAGNOSIS — D509 Iron deficiency anemia, unspecified: Secondary | ICD-10-CM | POA: Diagnosis not present

## 2014-04-12 DIAGNOSIS — E875 Hyperkalemia: Secondary | ICD-10-CM | POA: Diagnosis not present

## 2014-04-12 DIAGNOSIS — N186 End stage renal disease: Secondary | ICD-10-CM | POA: Diagnosis not present

## 2014-04-15 DIAGNOSIS — E875 Hyperkalemia: Secondary | ICD-10-CM | POA: Diagnosis not present

## 2014-04-15 DIAGNOSIS — N186 End stage renal disease: Secondary | ICD-10-CM | POA: Diagnosis not present

## 2014-04-15 DIAGNOSIS — D509 Iron deficiency anemia, unspecified: Secondary | ICD-10-CM | POA: Diagnosis not present

## 2014-04-17 DIAGNOSIS — D509 Iron deficiency anemia, unspecified: Secondary | ICD-10-CM | POA: Diagnosis not present

## 2014-04-17 DIAGNOSIS — E875 Hyperkalemia: Secondary | ICD-10-CM | POA: Diagnosis not present

## 2014-04-17 DIAGNOSIS — N186 End stage renal disease: Secondary | ICD-10-CM | POA: Diagnosis not present

## 2014-04-19 DIAGNOSIS — N186 End stage renal disease: Secondary | ICD-10-CM | POA: Diagnosis not present

## 2014-04-19 DIAGNOSIS — E875 Hyperkalemia: Secondary | ICD-10-CM | POA: Diagnosis not present

## 2014-04-19 DIAGNOSIS — D509 Iron deficiency anemia, unspecified: Secondary | ICD-10-CM | POA: Diagnosis not present

## 2014-04-22 DIAGNOSIS — E875 Hyperkalemia: Secondary | ICD-10-CM | POA: Diagnosis not present

## 2014-04-22 DIAGNOSIS — N186 End stage renal disease: Secondary | ICD-10-CM | POA: Diagnosis not present

## 2014-04-22 DIAGNOSIS — D509 Iron deficiency anemia, unspecified: Secondary | ICD-10-CM | POA: Diagnosis not present

## 2014-04-24 DIAGNOSIS — D509 Iron deficiency anemia, unspecified: Secondary | ICD-10-CM | POA: Diagnosis not present

## 2014-04-24 DIAGNOSIS — E875 Hyperkalemia: Secondary | ICD-10-CM | POA: Diagnosis not present

## 2014-04-24 DIAGNOSIS — N186 End stage renal disease: Secondary | ICD-10-CM | POA: Diagnosis not present

## 2014-04-26 DIAGNOSIS — E875 Hyperkalemia: Secondary | ICD-10-CM | POA: Diagnosis not present

## 2014-04-26 DIAGNOSIS — D509 Iron deficiency anemia, unspecified: Secondary | ICD-10-CM | POA: Diagnosis not present

## 2014-04-26 DIAGNOSIS — N186 End stage renal disease: Secondary | ICD-10-CM | POA: Diagnosis not present

## 2014-04-29 DIAGNOSIS — E875 Hyperkalemia: Secondary | ICD-10-CM | POA: Diagnosis not present

## 2014-04-29 DIAGNOSIS — D509 Iron deficiency anemia, unspecified: Secondary | ICD-10-CM | POA: Diagnosis not present

## 2014-04-29 DIAGNOSIS — N186 End stage renal disease: Secondary | ICD-10-CM | POA: Diagnosis not present

## 2014-05-01 DIAGNOSIS — N186 End stage renal disease: Secondary | ICD-10-CM | POA: Diagnosis not present

## 2014-05-01 DIAGNOSIS — D509 Iron deficiency anemia, unspecified: Secondary | ICD-10-CM | POA: Diagnosis not present

## 2014-05-01 DIAGNOSIS — E875 Hyperkalemia: Secondary | ICD-10-CM | POA: Diagnosis not present

## 2014-05-03 DIAGNOSIS — N186 End stage renal disease: Secondary | ICD-10-CM | POA: Diagnosis not present

## 2014-05-03 DIAGNOSIS — E875 Hyperkalemia: Secondary | ICD-10-CM | POA: Diagnosis not present

## 2014-05-03 DIAGNOSIS — D509 Iron deficiency anemia, unspecified: Secondary | ICD-10-CM | POA: Diagnosis not present

## 2014-05-06 DIAGNOSIS — N186 End stage renal disease: Secondary | ICD-10-CM | POA: Diagnosis not present

## 2014-05-06 DIAGNOSIS — D509 Iron deficiency anemia, unspecified: Secondary | ICD-10-CM | POA: Diagnosis not present

## 2014-05-06 DIAGNOSIS — E875 Hyperkalemia: Secondary | ICD-10-CM | POA: Diagnosis not present

## 2014-05-08 DIAGNOSIS — D631 Anemia in chronic kidney disease: Secondary | ICD-10-CM | POA: Diagnosis not present

## 2014-05-08 DIAGNOSIS — N186 End stage renal disease: Secondary | ICD-10-CM | POA: Diagnosis not present

## 2014-05-08 DIAGNOSIS — D509 Iron deficiency anemia, unspecified: Secondary | ICD-10-CM | POA: Diagnosis not present

## 2014-05-08 DIAGNOSIS — E875 Hyperkalemia: Secondary | ICD-10-CM | POA: Diagnosis not present

## 2014-05-10 DIAGNOSIS — D509 Iron deficiency anemia, unspecified: Secondary | ICD-10-CM | POA: Diagnosis not present

## 2014-05-10 DIAGNOSIS — N039 Chronic nephritic syndrome with unspecified morphologic changes: Secondary | ICD-10-CM | POA: Diagnosis not present

## 2014-05-10 DIAGNOSIS — N186 End stage renal disease: Secondary | ICD-10-CM | POA: Diagnosis not present

## 2014-05-10 DIAGNOSIS — E875 Hyperkalemia: Secondary | ICD-10-CM | POA: Diagnosis not present

## 2014-05-10 DIAGNOSIS — D631 Anemia in chronic kidney disease: Secondary | ICD-10-CM | POA: Diagnosis not present

## 2014-05-13 DIAGNOSIS — D631 Anemia in chronic kidney disease: Secondary | ICD-10-CM | POA: Diagnosis not present

## 2014-05-13 DIAGNOSIS — E875 Hyperkalemia: Secondary | ICD-10-CM | POA: Diagnosis not present

## 2014-05-13 DIAGNOSIS — N186 End stage renal disease: Secondary | ICD-10-CM | POA: Diagnosis not present

## 2014-05-13 DIAGNOSIS — D509 Iron deficiency anemia, unspecified: Secondary | ICD-10-CM | POA: Diagnosis not present

## 2014-05-15 DIAGNOSIS — E875 Hyperkalemia: Secondary | ICD-10-CM | POA: Diagnosis not present

## 2014-05-15 DIAGNOSIS — N186 End stage renal disease: Secondary | ICD-10-CM | POA: Diagnosis not present

## 2014-05-15 DIAGNOSIS — D631 Anemia in chronic kidney disease: Secondary | ICD-10-CM | POA: Diagnosis not present

## 2014-05-15 DIAGNOSIS — N039 Chronic nephritic syndrome with unspecified morphologic changes: Secondary | ICD-10-CM | POA: Diagnosis not present

## 2014-05-15 DIAGNOSIS — D509 Iron deficiency anemia, unspecified: Secondary | ICD-10-CM | POA: Diagnosis not present

## 2014-05-17 DIAGNOSIS — E875 Hyperkalemia: Secondary | ICD-10-CM | POA: Diagnosis not present

## 2014-05-17 DIAGNOSIS — D631 Anemia in chronic kidney disease: Secondary | ICD-10-CM | POA: Diagnosis not present

## 2014-05-17 DIAGNOSIS — D509 Iron deficiency anemia, unspecified: Secondary | ICD-10-CM | POA: Diagnosis not present

## 2014-05-17 DIAGNOSIS — N186 End stage renal disease: Secondary | ICD-10-CM | POA: Diagnosis not present

## 2014-05-20 DIAGNOSIS — D509 Iron deficiency anemia, unspecified: Secondary | ICD-10-CM | POA: Diagnosis not present

## 2014-05-20 DIAGNOSIS — E875 Hyperkalemia: Secondary | ICD-10-CM | POA: Diagnosis not present

## 2014-05-20 DIAGNOSIS — D631 Anemia in chronic kidney disease: Secondary | ICD-10-CM | POA: Diagnosis not present

## 2014-05-20 DIAGNOSIS — N186 End stage renal disease: Secondary | ICD-10-CM | POA: Diagnosis not present

## 2014-05-22 DIAGNOSIS — N039 Chronic nephritic syndrome with unspecified morphologic changes: Secondary | ICD-10-CM | POA: Diagnosis not present

## 2014-05-22 DIAGNOSIS — D631 Anemia in chronic kidney disease: Secondary | ICD-10-CM | POA: Diagnosis not present

## 2014-05-22 DIAGNOSIS — E875 Hyperkalemia: Secondary | ICD-10-CM | POA: Diagnosis not present

## 2014-05-22 DIAGNOSIS — D509 Iron deficiency anemia, unspecified: Secondary | ICD-10-CM | POA: Diagnosis not present

## 2014-05-22 DIAGNOSIS — N186 End stage renal disease: Secondary | ICD-10-CM | POA: Diagnosis not present

## 2014-05-24 DIAGNOSIS — D509 Iron deficiency anemia, unspecified: Secondary | ICD-10-CM | POA: Diagnosis not present

## 2014-05-24 DIAGNOSIS — E875 Hyperkalemia: Secondary | ICD-10-CM | POA: Diagnosis not present

## 2014-05-24 DIAGNOSIS — D631 Anemia in chronic kidney disease: Secondary | ICD-10-CM | POA: Diagnosis not present

## 2014-05-24 DIAGNOSIS — N186 End stage renal disease: Secondary | ICD-10-CM | POA: Diagnosis not present

## 2014-05-27 DIAGNOSIS — D631 Anemia in chronic kidney disease: Secondary | ICD-10-CM | POA: Diagnosis not present

## 2014-05-27 DIAGNOSIS — D509 Iron deficiency anemia, unspecified: Secondary | ICD-10-CM | POA: Diagnosis not present

## 2014-05-27 DIAGNOSIS — N186 End stage renal disease: Secondary | ICD-10-CM | POA: Diagnosis not present

## 2014-05-27 DIAGNOSIS — E875 Hyperkalemia: Secondary | ICD-10-CM | POA: Diagnosis not present

## 2014-05-29 DIAGNOSIS — D631 Anemia in chronic kidney disease: Secondary | ICD-10-CM | POA: Diagnosis not present

## 2014-05-29 DIAGNOSIS — E875 Hyperkalemia: Secondary | ICD-10-CM | POA: Diagnosis not present

## 2014-05-29 DIAGNOSIS — D509 Iron deficiency anemia, unspecified: Secondary | ICD-10-CM | POA: Diagnosis not present

## 2014-05-29 DIAGNOSIS — N186 End stage renal disease: Secondary | ICD-10-CM | POA: Diagnosis not present

## 2014-05-31 DIAGNOSIS — D631 Anemia in chronic kidney disease: Secondary | ICD-10-CM | POA: Diagnosis not present

## 2014-05-31 DIAGNOSIS — D509 Iron deficiency anemia, unspecified: Secondary | ICD-10-CM | POA: Diagnosis not present

## 2014-05-31 DIAGNOSIS — N186 End stage renal disease: Secondary | ICD-10-CM | POA: Diagnosis not present

## 2014-05-31 DIAGNOSIS — E875 Hyperkalemia: Secondary | ICD-10-CM | POA: Diagnosis not present

## 2014-06-03 DIAGNOSIS — D631 Anemia in chronic kidney disease: Secondary | ICD-10-CM | POA: Diagnosis not present

## 2014-06-03 DIAGNOSIS — E875 Hyperkalemia: Secondary | ICD-10-CM | POA: Diagnosis not present

## 2014-06-03 DIAGNOSIS — N186 End stage renal disease: Secondary | ICD-10-CM | POA: Diagnosis not present

## 2014-06-03 DIAGNOSIS — D509 Iron deficiency anemia, unspecified: Secondary | ICD-10-CM | POA: Diagnosis not present

## 2014-06-04 DIAGNOSIS — L570 Actinic keratosis: Secondary | ICD-10-CM | POA: Diagnosis not present

## 2014-06-04 DIAGNOSIS — L03317 Cellulitis of buttock: Secondary | ICD-10-CM | POA: Diagnosis not present

## 2014-06-04 DIAGNOSIS — L820 Inflamed seborrheic keratosis: Secondary | ICD-10-CM | POA: Diagnosis not present

## 2014-06-04 DIAGNOSIS — L301 Dyshidrosis [pompholyx]: Secondary | ICD-10-CM | POA: Diagnosis not present

## 2014-06-04 DIAGNOSIS — L0231 Cutaneous abscess of buttock: Secondary | ICD-10-CM | POA: Diagnosis not present

## 2014-06-05 DIAGNOSIS — D631 Anemia in chronic kidney disease: Secondary | ICD-10-CM | POA: Diagnosis not present

## 2014-06-05 DIAGNOSIS — E875 Hyperkalemia: Secondary | ICD-10-CM | POA: Diagnosis not present

## 2014-06-05 DIAGNOSIS — D509 Iron deficiency anemia, unspecified: Secondary | ICD-10-CM | POA: Diagnosis not present

## 2014-06-05 DIAGNOSIS — N186 End stage renal disease: Secondary | ICD-10-CM | POA: Diagnosis not present

## 2014-06-07 DIAGNOSIS — Z23 Encounter for immunization: Secondary | ICD-10-CM | POA: Diagnosis not present

## 2014-06-07 DIAGNOSIS — E875 Hyperkalemia: Secondary | ICD-10-CM | POA: Diagnosis not present

## 2014-06-07 DIAGNOSIS — D631 Anemia in chronic kidney disease: Secondary | ICD-10-CM | POA: Diagnosis not present

## 2014-06-07 DIAGNOSIS — D509 Iron deficiency anemia, unspecified: Secondary | ICD-10-CM | POA: Diagnosis not present

## 2014-06-07 DIAGNOSIS — N186 End stage renal disease: Secondary | ICD-10-CM | POA: Diagnosis not present

## 2014-06-27 DIAGNOSIS — H2513 Age-related nuclear cataract, bilateral: Secondary | ICD-10-CM | POA: Diagnosis not present

## 2014-07-04 DIAGNOSIS — Z0000 Encounter for general adult medical examination without abnormal findings: Secondary | ICD-10-CM | POA: Diagnosis not present

## 2014-07-06 DIAGNOSIS — N186 End stage renal disease: Secondary | ICD-10-CM | POA: Diagnosis not present

## 2014-07-06 DIAGNOSIS — Z992 Dependence on renal dialysis: Secondary | ICD-10-CM | POA: Diagnosis not present

## 2014-07-08 DIAGNOSIS — E875 Hyperkalemia: Secondary | ICD-10-CM | POA: Diagnosis not present

## 2014-07-08 DIAGNOSIS — D509 Iron deficiency anemia, unspecified: Secondary | ICD-10-CM | POA: Diagnosis not present

## 2014-07-08 DIAGNOSIS — N186 End stage renal disease: Secondary | ICD-10-CM | POA: Diagnosis not present

## 2014-07-16 DIAGNOSIS — L0231 Cutaneous abscess of buttock: Secondary | ICD-10-CM | POA: Diagnosis not present

## 2014-08-05 DIAGNOSIS — Z992 Dependence on renal dialysis: Secondary | ICD-10-CM | POA: Diagnosis not present

## 2014-08-05 DIAGNOSIS — N186 End stage renal disease: Secondary | ICD-10-CM | POA: Diagnosis not present

## 2014-08-07 DIAGNOSIS — E875 Hyperkalemia: Secondary | ICD-10-CM | POA: Diagnosis not present

## 2014-08-07 DIAGNOSIS — D509 Iron deficiency anemia, unspecified: Secondary | ICD-10-CM | POA: Diagnosis not present

## 2014-08-07 DIAGNOSIS — N186 End stage renal disease: Secondary | ICD-10-CM | POA: Diagnosis not present

## 2014-08-15 ENCOUNTER — Encounter (HOSPITAL_COMMUNITY): Payer: Self-pay | Admitting: Cardiovascular Disease

## 2014-09-05 DIAGNOSIS — Z992 Dependence on renal dialysis: Secondary | ICD-10-CM | POA: Diagnosis not present

## 2014-09-05 DIAGNOSIS — N186 End stage renal disease: Secondary | ICD-10-CM | POA: Diagnosis not present

## 2014-09-06 DIAGNOSIS — E875 Hyperkalemia: Secondary | ICD-10-CM | POA: Diagnosis not present

## 2014-09-06 DIAGNOSIS — N186 End stage renal disease: Secondary | ICD-10-CM | POA: Diagnosis not present

## 2014-09-17 DIAGNOSIS — R5383 Other fatigue: Secondary | ICD-10-CM | POA: Diagnosis not present

## 2014-09-17 DIAGNOSIS — G4737 Central sleep apnea in conditions classified elsewhere: Secondary | ICD-10-CM | POA: Diagnosis not present

## 2014-09-17 DIAGNOSIS — J449 Chronic obstructive pulmonary disease, unspecified: Secondary | ICD-10-CM | POA: Diagnosis not present

## 2014-09-17 DIAGNOSIS — J310 Chronic rhinitis: Secondary | ICD-10-CM | POA: Diagnosis not present

## 2014-10-01 DIAGNOSIS — G4737 Central sleep apnea in conditions classified elsewhere: Secondary | ICD-10-CM | POA: Diagnosis not present

## 2014-10-01 DIAGNOSIS — R5383 Other fatigue: Secondary | ICD-10-CM | POA: Diagnosis not present

## 2014-10-01 DIAGNOSIS — J310 Chronic rhinitis: Secondary | ICD-10-CM | POA: Diagnosis not present

## 2014-10-01 DIAGNOSIS — J449 Chronic obstructive pulmonary disease, unspecified: Secondary | ICD-10-CM | POA: Diagnosis not present

## 2014-10-06 DIAGNOSIS — N186 End stage renal disease: Secondary | ICD-10-CM | POA: Diagnosis not present

## 2014-10-06 DIAGNOSIS — Z992 Dependence on renal dialysis: Secondary | ICD-10-CM | POA: Diagnosis not present

## 2014-10-07 DIAGNOSIS — N2581 Secondary hyperparathyroidism of renal origin: Secondary | ICD-10-CM | POA: Diagnosis not present

## 2014-10-07 DIAGNOSIS — D509 Iron deficiency anemia, unspecified: Secondary | ICD-10-CM | POA: Diagnosis not present

## 2014-10-07 DIAGNOSIS — N186 End stage renal disease: Secondary | ICD-10-CM | POA: Diagnosis not present

## 2014-10-07 DIAGNOSIS — E875 Hyperkalemia: Secondary | ICD-10-CM | POA: Diagnosis not present

## 2014-10-08 DIAGNOSIS — N186 End stage renal disease: Secondary | ICD-10-CM | POA: Diagnosis not present

## 2014-10-08 DIAGNOSIS — Z992 Dependence on renal dialysis: Secondary | ICD-10-CM | POA: Diagnosis not present

## 2014-10-08 DIAGNOSIS — Z452 Encounter for adjustment and management of vascular access device: Secondary | ICD-10-CM | POA: Diagnosis not present

## 2014-10-08 DIAGNOSIS — Z4901 Encounter for fitting and adjustment of extracorporeal dialysis catheter: Secondary | ICD-10-CM | POA: Diagnosis not present

## 2014-10-08 DIAGNOSIS — T82898A Other specified complication of vascular prosthetic devices, implants and grafts, initial encounter: Secondary | ICD-10-CM | POA: Diagnosis not present

## 2014-10-22 DIAGNOSIS — J189 Pneumonia, unspecified organism: Secondary | ICD-10-CM | POA: Diagnosis not present

## 2014-10-22 DIAGNOSIS — J441 Chronic obstructive pulmonary disease with (acute) exacerbation: Secondary | ICD-10-CM | POA: Diagnosis not present

## 2014-10-22 DIAGNOSIS — R5383 Other fatigue: Secondary | ICD-10-CM | POA: Diagnosis not present

## 2014-10-22 DIAGNOSIS — G4737 Central sleep apnea in conditions classified elsewhere: Secondary | ICD-10-CM | POA: Diagnosis not present

## 2014-11-04 DIAGNOSIS — Z992 Dependence on renal dialysis: Secondary | ICD-10-CM | POA: Diagnosis not present

## 2014-11-04 DIAGNOSIS — N186 End stage renal disease: Secondary | ICD-10-CM | POA: Diagnosis not present

## 2014-11-06 DIAGNOSIS — N186 End stage renal disease: Secondary | ICD-10-CM | POA: Diagnosis not present

## 2014-11-06 DIAGNOSIS — E875 Hyperkalemia: Secondary | ICD-10-CM | POA: Diagnosis not present

## 2014-11-06 DIAGNOSIS — Z23 Encounter for immunization: Secondary | ICD-10-CM | POA: Diagnosis not present

## 2014-11-06 DIAGNOSIS — D509 Iron deficiency anemia, unspecified: Secondary | ICD-10-CM | POA: Diagnosis not present

## 2014-11-06 DIAGNOSIS — N2581 Secondary hyperparathyroidism of renal origin: Secondary | ICD-10-CM | POA: Diagnosis not present

## 2014-11-07 DIAGNOSIS — I517 Cardiomegaly: Secondary | ICD-10-CM | POA: Diagnosis not present

## 2014-11-07 DIAGNOSIS — R911 Solitary pulmonary nodule: Secondary | ICD-10-CM | POA: Diagnosis not present

## 2014-11-07 DIAGNOSIS — N62 Hypertrophy of breast: Secondary | ICD-10-CM | POA: Diagnosis not present

## 2014-11-07 DIAGNOSIS — Z85828 Personal history of other malignant neoplasm of skin: Secondary | ICD-10-CM | POA: Diagnosis not present

## 2014-11-07 DIAGNOSIS — J189 Pneumonia, unspecified organism: Secondary | ICD-10-CM | POA: Diagnosis not present

## 2014-11-07 DIAGNOSIS — J432 Centrilobular emphysema: Secondary | ICD-10-CM | POA: Diagnosis not present

## 2014-12-05 DIAGNOSIS — Z992 Dependence on renal dialysis: Secondary | ICD-10-CM | POA: Diagnosis not present

## 2014-12-05 DIAGNOSIS — T8612 Kidney transplant failure: Secondary | ICD-10-CM | POA: Diagnosis not present

## 2014-12-05 DIAGNOSIS — N186 End stage renal disease: Secondary | ICD-10-CM | POA: Diagnosis not present

## 2014-12-06 DIAGNOSIS — N2581 Secondary hyperparathyroidism of renal origin: Secondary | ICD-10-CM | POA: Diagnosis not present

## 2014-12-06 DIAGNOSIS — N186 End stage renal disease: Secondary | ICD-10-CM | POA: Diagnosis not present

## 2014-12-06 DIAGNOSIS — E875 Hyperkalemia: Secondary | ICD-10-CM | POA: Diagnosis not present

## 2014-12-13 DIAGNOSIS — Z952 Presence of prosthetic heart valve: Secondary | ICD-10-CM | POA: Diagnosis not present

## 2014-12-13 DIAGNOSIS — I151 Hypertension secondary to other renal disorders: Secondary | ICD-10-CM | POA: Diagnosis not present

## 2014-12-13 DIAGNOSIS — N289 Disorder of kidney and ureter, unspecified: Secondary | ICD-10-CM | POA: Diagnosis not present

## 2014-12-13 DIAGNOSIS — Z951 Presence of aortocoronary bypass graft: Secondary | ICD-10-CM | POA: Diagnosis not present

## 2014-12-13 DIAGNOSIS — Z992 Dependence on renal dialysis: Secondary | ICD-10-CM | POA: Diagnosis not present

## 2014-12-13 DIAGNOSIS — Z79899 Other long term (current) drug therapy: Secondary | ICD-10-CM | POA: Diagnosis not present

## 2014-12-13 DIAGNOSIS — I480 Paroxysmal atrial fibrillation: Secondary | ICD-10-CM | POA: Diagnosis not present

## 2014-12-13 DIAGNOSIS — I2510 Atherosclerotic heart disease of native coronary artery without angina pectoris: Secondary | ICD-10-CM | POA: Diagnosis not present

## 2014-12-13 DIAGNOSIS — R5383 Other fatigue: Secondary | ICD-10-CM | POA: Diagnosis not present

## 2014-12-13 DIAGNOSIS — Z953 Presence of xenogenic heart valve: Secondary | ICD-10-CM | POA: Diagnosis not present

## 2014-12-13 DIAGNOSIS — R079 Chest pain, unspecified: Secondary | ICD-10-CM | POA: Diagnosis not present

## 2014-12-31 DIAGNOSIS — Z01810 Encounter for preprocedural cardiovascular examination: Secondary | ICD-10-CM | POA: Diagnosis not present

## 2014-12-31 DIAGNOSIS — I2510 Atherosclerotic heart disease of native coronary artery without angina pectoris: Secondary | ICD-10-CM | POA: Diagnosis not present

## 2015-01-04 DIAGNOSIS — N186 End stage renal disease: Secondary | ICD-10-CM | POA: Diagnosis not present

## 2015-01-04 DIAGNOSIS — Z992 Dependence on renal dialysis: Secondary | ICD-10-CM | POA: Diagnosis not present

## 2015-01-04 DIAGNOSIS — T8612 Kidney transplant failure: Secondary | ICD-10-CM | POA: Diagnosis not present

## 2015-01-06 DIAGNOSIS — J209 Acute bronchitis, unspecified: Secondary | ICD-10-CM | POA: Diagnosis not present

## 2015-01-06 DIAGNOSIS — N2581 Secondary hyperparathyroidism of renal origin: Secondary | ICD-10-CM | POA: Diagnosis not present

## 2015-01-06 DIAGNOSIS — N186 End stage renal disease: Secondary | ICD-10-CM | POA: Diagnosis not present

## 2015-01-06 DIAGNOSIS — E875 Hyperkalemia: Secondary | ICD-10-CM | POA: Diagnosis not present

## 2015-01-08 ENCOUNTER — Emergency Department (HOSPITAL_COMMUNITY)
Admission: EM | Admit: 2015-01-08 | Discharge: 2015-01-09 | Disposition: A | Discharge status: Other Acute Inpatient Hospital | Payer: Medicare Other | Attending: Emergency Medicine | Admitting: Emergency Medicine

## 2015-01-08 ENCOUNTER — Emergency Department (HOSPITAL_COMMUNITY): Payer: Medicare Other

## 2015-01-08 ENCOUNTER — Encounter (HOSPITAL_COMMUNITY): Payer: Self-pay | Admitting: *Deleted

## 2015-01-08 DIAGNOSIS — Z940 Kidney transplant status: Secondary | ICD-10-CM | POA: Diagnosis not present

## 2015-01-08 DIAGNOSIS — Z8669 Personal history of other diseases of the nervous system and sense organs: Secondary | ICD-10-CM | POA: Diagnosis not present

## 2015-01-08 DIAGNOSIS — Z7982 Long term (current) use of aspirin: Secondary | ICD-10-CM | POA: Diagnosis not present

## 2015-01-08 DIAGNOSIS — Z880 Allergy status to penicillin: Secondary | ICD-10-CM | POA: Diagnosis not present

## 2015-01-08 DIAGNOSIS — Z992 Dependence on renal dialysis: Secondary | ICD-10-CM | POA: Diagnosis not present

## 2015-01-08 DIAGNOSIS — Z87891 Personal history of nicotine dependence: Secondary | ICD-10-CM | POA: Insufficient documentation

## 2015-01-08 DIAGNOSIS — Z7902 Long term (current) use of antithrombotics/antiplatelets: Secondary | ICD-10-CM | POA: Insufficient documentation

## 2015-01-08 DIAGNOSIS — I120 Hypertensive chronic kidney disease with stage 5 chronic kidney disease or end stage renal disease: Secondary | ICD-10-CM | POA: Insufficient documentation

## 2015-01-08 DIAGNOSIS — N186 End stage renal disease: Secondary | ICD-10-CM | POA: Insufficient documentation

## 2015-01-08 DIAGNOSIS — I4891 Unspecified atrial fibrillation: Secondary | ICD-10-CM | POA: Diagnosis not present

## 2015-01-08 DIAGNOSIS — Z79899 Other long term (current) drug therapy: Secondary | ICD-10-CM | POA: Insufficient documentation

## 2015-01-08 DIAGNOSIS — R05 Cough: Secondary | ICD-10-CM | POA: Diagnosis not present

## 2015-01-08 DIAGNOSIS — N2581 Secondary hyperparathyroidism of renal origin: Secondary | ICD-10-CM | POA: Diagnosis not present

## 2015-01-08 DIAGNOSIS — E875 Hyperkalemia: Secondary | ICD-10-CM | POA: Diagnosis not present

## 2015-01-08 DIAGNOSIS — I214 Non-ST elevation (NSTEMI) myocardial infarction: Secondary | ICD-10-CM | POA: Diagnosis not present

## 2015-01-08 DIAGNOSIS — R0602 Shortness of breath: Secondary | ICD-10-CM | POA: Diagnosis present

## 2015-01-08 LAB — CBC WITH DIFFERENTIAL/PLATELET
Basophils Absolute: 0 10*3/uL (ref 0.0–0.1)
Basophils Relative: 0 % (ref 0–1)
Eosinophils Absolute: 0.2 10*3/uL (ref 0.0–0.7)
Eosinophils Relative: 2 % (ref 0–5)
HEMATOCRIT: 40.7 % (ref 39.0–52.0)
Hemoglobin: 13.4 g/dL (ref 13.0–17.0)
LYMPHS PCT: 12 % (ref 12–46)
Lymphs Abs: 1 10*3/uL (ref 0.7–4.0)
MCH: 33.8 pg (ref 26.0–34.0)
MCHC: 32.9 g/dL (ref 30.0–36.0)
MCV: 102.5 fL — ABNORMAL HIGH (ref 78.0–100.0)
MONOS PCT: 12 % (ref 3–12)
Monocytes Absolute: 1 10*3/uL (ref 0.1–1.0)
NEUTROS PCT: 74 % (ref 43–77)
Neutro Abs: 6 10*3/uL (ref 1.7–7.7)
Platelets: 147 10*3/uL — ABNORMAL LOW (ref 150–400)
RBC: 3.97 MIL/uL — AB (ref 4.22–5.81)
RDW: 14.7 % (ref 11.5–15.5)
WBC: 8.3 10*3/uL (ref 4.0–10.5)

## 2015-01-08 LAB — BASIC METABOLIC PANEL
Anion gap: 16 — ABNORMAL HIGH (ref 5–15)
BUN: 35 mg/dL — AB (ref 6–20)
CO2: 24 mmol/L (ref 22–32)
Calcium: 9.3 mg/dL (ref 8.9–10.3)
Chloride: 98 mmol/L — ABNORMAL LOW (ref 101–111)
Creatinine, Ser: 6.62 mg/dL — ABNORMAL HIGH (ref 0.61–1.24)
GFR calc Af Amer: 9 mL/min — ABNORMAL LOW (ref >60)
GFR, EST NON AFRICAN AMERICAN: 8 mL/min — AB (ref >60)
GLUCOSE: 115 mg/dL — AB (ref 70–99)
POTASSIUM: 4.7 mmol/L (ref 3.5–5.1)
Sodium: 138 mmol/L (ref 135–145)

## 2015-01-08 LAB — TROPONIN I: Troponin I: 19.71 ng/mL (ref <0.031)

## 2015-01-08 LAB — I-STAT TROPONIN, ED: TROPONIN I, POC: 10.74 ng/mL — AB (ref 0.00–0.08)

## 2015-01-08 LAB — BRAIN NATRIURETIC PEPTIDE: B Natriuretic Peptide: 1085 pg/mL — ABNORMAL HIGH (ref 0.0–100.0)

## 2015-01-08 MED ORDER — ASPIRIN 325 MG PO TABS
325.0000 mg | ORAL_TABLET | Freq: Once | ORAL | Status: AC
  Administered 2015-01-08: 325 mg via ORAL
  Filled 2015-01-08: qty 1

## 2015-01-08 NOTE — ED Notes (Signed)
Reported troponin result of 19.71 to Dr. Venora Maples.

## 2015-01-08 NOTE — ED Provider Notes (Signed)
CSN: [id number]     Arrival date & time 01/08/15  2202 History   First MD Initiated Contact with Patient 01/08/15 2302     Chief Complaint  Patient presents with  . Shortness of Breath      HPI Patient has a known history of coronary artery disease.  He is currently a dialysis patient.  2 days ago he had severe chest pressure and pain in his neck and bilateral trapezius region towards the end of dialysis.  He became short of breath at that time as well.  After that time he's continued having exertional shortness of breath without significant orthopnea through the rest of the week.  He no longer his had any chest discomfort or chest pressure.  He reports to me he had a recent outpatient stress which demonstrated an area of reversible ischemia.  This was done 2 weeks ago at [facility] by his cardiologist.  He was recently taken off the renal transplant list for this.  No active chest pain or shortness of breath at this time at rest.  Elevated troponin noted prior to my evaluation at the bedside.   Past Medical History  Diagnosis Date  . Multiple sclerosis   . CAD (coronary artery disease)   . Aortic heart valve prolapse   . Peripheral vascular disease   . Sleep apnea     ordered bipap with 3L bled in, but pt will not wear  . Atrial fibrillation   . Multiple sclerosis   . 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  He had a renal Tx from 2008 to 2011.  Gets HD in [place], [place] on MWF schedule.     Past Surgical History  Procedure Laterality Date  . Av fistula placement    . Flash      flash pulmonary edema  . Kidney transplant  08/2007  . Hernia repair  07/2011  . Excised squamous cells at rectum  2006  . Left heart catheterization with coronary angiogram N/A 01/09/2013    Procedure: LEFT HEART CATHETERIZATION WITH CORONARY ANGIOGRAM;  Surgeon: Burnell Blanks,  MD;  Location: [facility] CATH LAB;  Service: Cardiovascular;  Laterality: N/A;   No family history on file. History  Substance Use Topics  . Smoking status: Former Smoker -- 2.00 packs/day for 20 years    Types: Cigarettes    Quit date: 08/06/1998  . Smokeless tobacco: Not on file  . Alcohol Use: No    Review of Systems  All other systems reviewed and are negative.     Allergies  Diphenhydramine; Penicillins; and Adhesive  Home Medications   Prior to Admission medications   Medication Sig Start Date End Date Taking? Authorizing Provider  acetaminophen (TYLENOL) 500 MG tablet Take 1,000 mg by mouth every 6 (six) hours as needed for pain or fever.    Historical Provider, MD  aspirin EC 81 MG tablet Take 81 mg by mouth daily.    Historical Provider, MD  b complex-vitamin c-folic acid (NEPHRO-VITE) 0.8 MG TABS Take 0.8 mg by mouth daily.     Historical Provider, MD  calcium acetate (PHOSLO) 667 MG capsule Take 2,001 mg by mouth 3 (three) times daily with meals.    Historical Provider, MD  citalopram (CELEXA) 20 MG tablet Take 20 mg by mouth daily.    Historical Provider, MD  clopidogrel (  PLAVIX) 75 MG tablet Take 1 tablet (75 mg total) by mouth daily with breakfast. 01/15/13   Burnell Blanks, MD  docusate sodium (COLACE) 100 MG capsule Take 400 mg by mouth 2 (two) times daily.    Historical Provider, MD  folic acid (FOLVITE) 1 MG tablet Take 1 mg by mouth daily.    Historical Provider, MD  gabapentin (NEURONTIN) 300 MG capsule Take 300 mg by mouth at bedtime.     Historical Provider, MD  isosorbide mononitrate (IMDUR) 30 MG 24 hr tablet Take 1 tablet (30 mg total) by mouth daily. 01/15/13   Burnell Blanks, MD  lanthanum (FOSRENOL) 1000 MG chewable tablet Chew 2,000 mg by mouth 3 (three) times daily. Takes with meals and snacks    Historical Provider, MD  metoprolol (LOPRESSOR) 50 MG tablet Take 25-50 mg by mouth 2 (two) times daily. '25mg'$  twice daily on dialysis days  (M,W,F) Non-diaylsis days: '50mg'$  in the morning '25mg'$  in the evening    Historical Provider, MD  multivitamin (RENA-VIT) TABS tablet Take 1 tablet by mouth daily. 01/11/13   Samella Parr, NP  nitroGLYCERIN (NITROSTAT) 0.4 MG SL tablet Place 1 tablet (0.4 mg total) under the tongue every 5 (five) minutes as needed for chest pain (Take one tabe every 5-10 minutes for chest pain. Once you have taken three tabs you need to notify your MD or go to ER if chest pain not gone.). 01/15/13   Burnell Blanks, MD  omeprazole (PRILOSEC OTC) 20 MG tablet Take 20 mg by mouth 2 (two) times daily.     Historical Provider, MD  Probiotic Product (PROBIOTIC PO) Take 1 capsule by mouth 2 (two) times daily.    Historical Provider, MD  simvastatin (ZOCOR) 20 MG tablet Take 10 mg by mouth 2 (two) times daily.     Historical Provider, MD   BP 148/86 mmHg  Pulse 73  Temp(Src) 98.9 F (37.2 C) (Oral)  Resp 18  SpO2 91% Physical Exam  Constitutional: He is oriented to person, place, and time. He appears well-developed and well-nourished.  HENT:  Head: Normocephalic and atraumatic.  Eyes: EOM are normal.  Neck: Normal range of motion.  Cardiovascular: Normal rate, regular rhythm, normal heart sounds and intact distal pulses.   Pulmonary/Chest: Effort normal and breath sounds normal. No respiratory distress.  Abdominal: Soft. He exhibits no distension. There is no tenderness.  Musculoskeletal: Normal range of motion.  Neurological: He is alert and oriented to person, place, and time.  Skin: Skin is warm and dry.  Psychiatric: He has a normal mood and affect. Judgment normal.  Nursing note and vitals reviewed.   ED Course  Procedures (including critical care time)  CRITICAL CARE Performed by: Hoy Morn Total critical care time: 40 Critical care time was exclusive of separately billable procedures and treating other patients. Critical care was necessary to treat or prevent imminent or  life-threatening deterioration. Critical care was time spent personally by me on the following activities: development of treatment plan with patient and/or surrogate as well as nursing, discussions with consultants, evaluation of patient's response to treatment, examination of patient, obtaining history from patient or surrogate, ordering and performing treatments and interventions, ordering and review of laboratory studies, ordering and review of radiographic studies, pulse oximetry and re-evaluation of patient's condition.    Labs Review Labs Reviewed  BASIC METABOLIC PANEL - Abnormal; Notable for the following:    Chloride 98 (*)    Glucose, Bld 115 (*)  BUN 35 (*)    Creatinine, Ser 6.62 (*)    GFR calc non Af Amer 8 (*)    GFR calc Af Amer 9 (*)    Anion gap 16 (*)    All other components within normal limits  BRAIN NATRIURETIC PEPTIDE - Abnormal; Notable for the following:    B Natriuretic Peptide 1085.0 (*)    All other components within normal limits  TROPONIN I - Abnormal; Notable for the following:    Troponin I 19.71 (*)    All other components within normal limits  CBC WITH DIFFERENTIAL/PLATELET - Abnormal; Notable for the following:    RBC 3.97 (*)    MCV 102.5 (*)    Platelets 147 (*)    All other components within normal limits  I-STAT TROPOININ, ED - Abnormal; Notable for the following:    Troponin i, poc 10.74 (*)    All other components within normal limits    Imaging Review Dg Chest 2 View  01/08/2015   CLINICAL DATA:  Short of breath and cough for 1 week. History of CABG in dialysis.  EXAM: CHEST  2 VIEW  COMPARISON:  CT 11/07/2014  FINDINGS: Sternotomy wires overlie normal cardiac silhouette. Trace right pleural effusion. There is chronic bronchitic markings. There is a fine interstitial pattern. No focal consolidation. No pneumothorax.  IMPRESSION: Mild interstitial edema and small effusion. No evidence of pneumonia.   Electronically Signed   By: Suzy Bouchard M.D.   On: 01/08/2015 22:38  I personally reviewed the imaging tests through PACS system I reviewed available ER/hospitalization records through the EMR    EKG Interpretation   Date/Time:  Wednesday Jan 08 2015 22:55:35 EDT Ventricular Rate:  83 PR Interval:  266 QRS Duration: 108 QT Interval:  355 QTC Calculation: 417 R Axis:   75 Text Interpretation:  Sinus rhythm Prolonged PR interval RSR' in V1 or V2,  right VCD or RVH nonspecific st changes Confirmed by Aynsley Fleet  MD, Aryanne Gilleland  ([id number]) on 01/08/2015 11:26:59 PM      MDM   Final diagnoses:  NSTEMI (non-ST elevated myocardial infarction)    I suspect the patient had his MI 2 days ago.  His troponin is elevated here.  He is currently without chest pain or shortness of breath at rest.  He does appear to have exertional shortness of breath.  I think the patient is best managed at [facility] by his cardiologist.  His renal transplant team as there as well.  Given his recent outpatient testing demonstrating reversible ischemia the patient definitely will benefit from heart catheterization.  No indication for emergent heart catheterization tonight.  Patient will be admitted and transferred to [facility] to a stepdown bed.  Patient will be initiated on heparin at this time.  I spoke with Dr. Oleta Mouse who accepts the patient in transfer.  Patient was continued to be observed closely while in the emergency department.  He had no recurrence of chest pain shortness of breath.  Transferred in stable condition.    Jola Schmidt, MD 01/10/15 [phone]

## 2015-01-08 NOTE — ED Notes (Signed)
Troponin level of 19.71 called in from main lab.

## 2015-01-08 NOTE — ED Notes (Signed)
Called pharmacy to inform of plan to transfer, inquired about heparin drip.

## 2015-01-08 NOTE — ED Notes (Signed)
The pt is c/o sob since Saturday getting worse.  He was seen by his doctor on Monday and he was placed on a z-pak.  Cough clear sputum.  Dialysis pt dialyzed  Last this am.  Fistula lt arm

## 2015-01-08 NOTE — Progress Notes (Signed)
ANTICOAGULATION CONSULT NOTE - Initial Consult  Pharmacy Consult for Heparin Indication: chest pain/ACS /STEMI  Allergies  Allergen Reactions  . Diphenhydramine Itching    Only with IV doses. Tolerates oral.  . Penicillins Other (See Comments)    migraine  . Adhesive [Tape] Rash    Please use paper tape    Patient Measurements: Height: '5\' 10"'$  (177.8 cm) Weight: 177 lb 4 oz (80.4 kg) (standing scale used) IBW/kg (Calculated) : 73 Heparin Dosing Weight: 80.4 kg  Vital Signs: Temp: 98.9 F (37.2 C) (05/04 2222) Temp Source: Oral (05/04 2222) BP: 127/80 mmHg (05/04 2315) Pulse Rate: 79 (05/04 2315)  Labs:  Recent Labs  01/08/15 2217  HGB 13.4  HCT 40.7  PLT 147*  CREATININE 6.62*  TROPONINI 19.71*    Estimated Creatinine Clearance: 12.4 mL/min (by C-G formula based on Cr of 6.62).   Medical History: Past Medical History  Diagnosis Date  . Multiple sclerosis   . CAD (coronary artery disease)   . Aortic heart valve prolapse   . Peripheral vascular disease   . Sleep apnea     ordered bipap with 3L bled in, but pt will not wear  . Atrial fibrillation   . Multiple sclerosis   . 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  He had a renal Tx from 2008 to 2011.  Gets HD in [place], [place] on MWF schedule.       Assessment: 59 y.o male with multiple sclerosis, ESRD c/o SOB since Saturday and getting worse.  H/o ESRD,  Last dialyzed this AM 01/08/15. Troponin level = 19.71.  Pharmacy consulted to dose heparin infusion for ACS/STEMI.   Not on anticoagulation prior to admission.  PLTC 147K, H/H 13.4/40.7. No bleeding reported.  Goal of Therapy:  Heparin level 0.3-0.7 units/ml Monitor platelets by anticoagulation protocol: Yes   Plan:  Heparin 4000 units IV bolus x 1 now and heparin drip at 950 units/hr.  RN reports that patient is to transfer to [facility]  hospital- awaiting bed. Will need to f/u 6 hour heparin level then daily heparin level and CBC.   Nicole Cella, RPh Clinical Pharmacist Pager: [phone] 01/08/2015,11:52 PM

## 2015-01-09 DIAGNOSIS — Z9109 Other allergy status, other than to drugs and biological substances: Secondary | ICD-10-CM | POA: Diagnosis not present

## 2015-01-09 DIAGNOSIS — I517 Cardiomegaly: Secondary | ICD-10-CM | POA: Diagnosis not present

## 2015-01-09 DIAGNOSIS — R0600 Dyspnea, unspecified: Secondary | ICD-10-CM | POA: Diagnosis not present

## 2015-01-09 DIAGNOSIS — I351 Nonrheumatic aortic (valve) insufficiency: Secondary | ICD-10-CM | POA: Diagnosis not present

## 2015-01-09 DIAGNOSIS — Z8669 Personal history of other diseases of the nervous system and sense organs: Secondary | ICD-10-CM | POA: Diagnosis not present

## 2015-01-09 DIAGNOSIS — N186 End stage renal disease: Secondary | ICD-10-CM | POA: Diagnosis present

## 2015-01-09 DIAGNOSIS — G4733 Obstructive sleep apnea (adult) (pediatric): Secondary | ICD-10-CM | POA: Diagnosis present

## 2015-01-09 DIAGNOSIS — R0902 Hypoxemia: Secondary | ICD-10-CM | POA: Diagnosis not present

## 2015-01-09 DIAGNOSIS — Z79899 Other long term (current) drug therapy: Secondary | ICD-10-CM | POA: Diagnosis not present

## 2015-01-09 DIAGNOSIS — Z888 Allergy status to other drugs, medicaments and biological substances status: Secondary | ICD-10-CM | POA: Diagnosis not present

## 2015-01-09 DIAGNOSIS — Z992 Dependence on renal dialysis: Secondary | ICD-10-CM | POA: Diagnosis not present

## 2015-01-09 DIAGNOSIS — I214 Non-ST elevation (NSTEMI) myocardial infarction: Secondary | ICD-10-CM | POA: Diagnosis present

## 2015-01-09 DIAGNOSIS — I120 Hypertensive chronic kidney disease with stage 5 chronic kidney disease or end stage renal disease: Secondary | ICD-10-CM | POA: Diagnosis not present

## 2015-01-09 DIAGNOSIS — J206 Acute bronchitis due to rhinovirus: Secondary | ICD-10-CM | POA: Diagnosis present

## 2015-01-09 DIAGNOSIS — D631 Anemia in chronic kidney disease: Secondary | ICD-10-CM | POA: Diagnosis not present

## 2015-01-09 DIAGNOSIS — Z952 Presence of prosthetic heart valve: Secondary | ICD-10-CM | POA: Diagnosis not present

## 2015-01-09 DIAGNOSIS — T82857A Stenosis of cardiac prosthetic devices, implants and grafts, initial encounter: Secondary | ICD-10-CM | POA: Diagnosis present

## 2015-01-09 DIAGNOSIS — T8612 Kidney transplant failure: Secondary | ICD-10-CM | POA: Diagnosis present

## 2015-01-09 DIAGNOSIS — Z7982 Long term (current) use of aspirin: Secondary | ICD-10-CM | POA: Diagnosis not present

## 2015-01-09 DIAGNOSIS — I4892 Unspecified atrial flutter: Secondary | ICD-10-CM | POA: Diagnosis not present

## 2015-01-09 DIAGNOSIS — I2510 Atherosclerotic heart disease of native coronary artery without angina pectoris: Secondary | ICD-10-CM | POA: Diagnosis present

## 2015-01-09 DIAGNOSIS — Z87891 Personal history of nicotine dependence: Secondary | ICD-10-CM | POA: Diagnosis not present

## 2015-01-09 DIAGNOSIS — Z880 Allergy status to penicillin: Secondary | ICD-10-CM | POA: Diagnosis not present

## 2015-01-09 DIAGNOSIS — I480 Paroxysmal atrial fibrillation: Secondary | ICD-10-CM | POA: Diagnosis not present

## 2015-01-09 DIAGNOSIS — I4891 Unspecified atrial fibrillation: Secondary | ICD-10-CM | POA: Diagnosis not present

## 2015-01-09 DIAGNOSIS — Z7902 Long term (current) use of antithrombotics/antiplatelets: Secondary | ICD-10-CM | POA: Diagnosis not present

## 2015-01-09 DIAGNOSIS — Z940 Kidney transplant status: Secondary | ICD-10-CM | POA: Diagnosis not present

## 2015-01-09 DIAGNOSIS — I340 Nonrheumatic mitral (valve) insufficiency: Secondary | ICD-10-CM | POA: Diagnosis not present

## 2015-01-09 DIAGNOSIS — G35 Multiple sclerosis: Secondary | ICD-10-CM | POA: Diagnosis present

## 2015-01-09 DIAGNOSIS — Z881 Allergy status to other antibiotic agents status: Secondary | ICD-10-CM | POA: Diagnosis not present

## 2015-01-09 DIAGNOSIS — I252 Old myocardial infarction: Secondary | ICD-10-CM | POA: Diagnosis not present

## 2015-01-09 DIAGNOSIS — I499 Cardiac arrhythmia, unspecified: Secondary | ICD-10-CM | POA: Diagnosis not present

## 2015-01-09 DIAGNOSIS — R0602 Shortness of breath: Secondary | ICD-10-CM | POA: Diagnosis present

## 2015-01-09 MED ORDER — HEPARIN BOLUS VIA INFUSION
4000.0000 [IU] | INTRAVENOUS | Status: AC
  Filled 2015-01-09: qty 4000

## 2015-01-09 MED ORDER — HEPARIN (PORCINE) IN NACL 100-0.45 UNIT/ML-% IJ SOLN
950.0000 [IU]/h | INTRAMUSCULAR | Status: DC
  Administered 2015-01-09: 950 [IU]/h via INTRAVENOUS
  Filled 2015-01-09: qty 250

## 2015-01-09 NOTE — ED Notes (Signed)
New room assignment obtained, [id number]. ([id number]).

## 2015-01-09 NOTE — ED Notes (Signed)
Pt Wife number [phone]

## 2015-01-09 NOTE — ED Notes (Signed)
Secretary called for transport team.

## 2015-01-09 NOTE — ED Notes (Signed)
[facility] at bedside 

## 2015-01-09 NOTE — ED Notes (Signed)
Received call from [facility]. Patient requires step down bed, bed change in process. Discussed transport from [facility] not arrived yet.

## 2015-01-09 NOTE — ED Notes (Signed)
Spoke to [facility] in coordination, inquired if CCU bed or ICU bed is available as [facility] is here for transport. They report currently looking for stepdown bed assignment as appropriate, but transport must be placed on hold. Informed charge and Dr. Venora Maples, patient is to stay in this ER until bed assignment.

## 2015-01-09 NOTE — ED Notes (Signed)
Called main pharmacy, spoke to Santiago Glad in regards to heparin drip. Being sent now.

## 2015-01-09 NOTE — ED Notes (Signed)
Left room assignment number at [facility] on answering machine as patient and spouse identified themselves on the recording. Bed Assignment at [facility] is [phone], receiving nurse is Amy, and her direct line is [phone]. [facility] present and aware of new room assignment. Informed receiving RN Amy that [facility] expects ETA of 1 hour.

## 2015-01-13 DIAGNOSIS — E875 Hyperkalemia: Secondary | ICD-10-CM | POA: Diagnosis not present

## 2015-01-13 DIAGNOSIS — N186 End stage renal disease: Secondary | ICD-10-CM | POA: Diagnosis not present

## 2015-01-13 DIAGNOSIS — N2581 Secondary hyperparathyroidism of renal origin: Secondary | ICD-10-CM | POA: Diagnosis not present

## 2015-01-14 DIAGNOSIS — L821 Other seborrheic keratosis: Secondary | ICD-10-CM | POA: Diagnosis not present

## 2015-01-14 DIAGNOSIS — L570 Actinic keratosis: Secondary | ICD-10-CM | POA: Diagnosis not present

## 2015-01-14 DIAGNOSIS — L853 Xerosis cutis: Secondary | ICD-10-CM | POA: Diagnosis not present

## 2015-01-15 DIAGNOSIS — N186 End stage renal disease: Secondary | ICD-10-CM | POA: Diagnosis not present

## 2015-01-15 DIAGNOSIS — N2581 Secondary hyperparathyroidism of renal origin: Secondary | ICD-10-CM | POA: Diagnosis not present

## 2015-01-15 DIAGNOSIS — E875 Hyperkalemia: Secondary | ICD-10-CM | POA: Diagnosis not present

## 2015-01-17 DIAGNOSIS — Z952 Presence of prosthetic heart valve: Secondary | ICD-10-CM | POA: Diagnosis not present

## 2015-01-17 DIAGNOSIS — N186 End stage renal disease: Secondary | ICD-10-CM | POA: Diagnosis not present

## 2015-01-17 DIAGNOSIS — I2510 Atherosclerotic heart disease of native coronary artery without angina pectoris: Secondary | ICD-10-CM | POA: Diagnosis not present

## 2015-01-17 DIAGNOSIS — Z6825 Body mass index (BMI) 25.0-25.9, adult: Secondary | ICD-10-CM | POA: Diagnosis not present

## 2015-01-17 DIAGNOSIS — I480 Paroxysmal atrial fibrillation: Secondary | ICD-10-CM | POA: Diagnosis not present

## 2015-01-17 DIAGNOSIS — N2581 Secondary hyperparathyroidism of renal origin: Secondary | ICD-10-CM | POA: Diagnosis not present

## 2015-01-17 DIAGNOSIS — E875 Hyperkalemia: Secondary | ICD-10-CM | POA: Diagnosis not present

## 2015-01-20 DIAGNOSIS — E875 Hyperkalemia: Secondary | ICD-10-CM | POA: Diagnosis not present

## 2015-01-20 DIAGNOSIS — N2581 Secondary hyperparathyroidism of renal origin: Secondary | ICD-10-CM | POA: Diagnosis not present

## 2015-01-20 DIAGNOSIS — N186 End stage renal disease: Secondary | ICD-10-CM | POA: Diagnosis not present

## 2015-01-22 DIAGNOSIS — E875 Hyperkalemia: Secondary | ICD-10-CM | POA: Diagnosis not present

## 2015-01-22 DIAGNOSIS — N2581 Secondary hyperparathyroidism of renal origin: Secondary | ICD-10-CM | POA: Diagnosis not present

## 2015-01-22 DIAGNOSIS — N186 End stage renal disease: Secondary | ICD-10-CM | POA: Diagnosis not present

## 2015-01-24 DIAGNOSIS — N186 End stage renal disease: Secondary | ICD-10-CM | POA: Diagnosis not present

## 2015-01-24 DIAGNOSIS — E875 Hyperkalemia: Secondary | ICD-10-CM | POA: Diagnosis not present

## 2015-01-24 DIAGNOSIS — N2581 Secondary hyperparathyroidism of renal origin: Secondary | ICD-10-CM | POA: Diagnosis not present

## 2015-01-27 DIAGNOSIS — N186 End stage renal disease: Secondary | ICD-10-CM | POA: Diagnosis not present

## 2015-01-27 DIAGNOSIS — E875 Hyperkalemia: Secondary | ICD-10-CM | POA: Diagnosis not present

## 2015-01-27 DIAGNOSIS — N2581 Secondary hyperparathyroidism of renal origin: Secondary | ICD-10-CM | POA: Diagnosis not present

## 2015-01-29 DIAGNOSIS — N186 End stage renal disease: Secondary | ICD-10-CM | POA: Diagnosis not present

## 2015-01-29 DIAGNOSIS — E875 Hyperkalemia: Secondary | ICD-10-CM | POA: Diagnosis not present

## 2015-01-29 DIAGNOSIS — N2581 Secondary hyperparathyroidism of renal origin: Secondary | ICD-10-CM | POA: Diagnosis not present

## 2015-01-31 DIAGNOSIS — N186 End stage renal disease: Secondary | ICD-10-CM | POA: Diagnosis not present

## 2015-01-31 DIAGNOSIS — N2581 Secondary hyperparathyroidism of renal origin: Secondary | ICD-10-CM | POA: Diagnosis not present

## 2015-01-31 DIAGNOSIS — E875 Hyperkalemia: Secondary | ICD-10-CM | POA: Diagnosis not present

## 2015-01-31 DIAGNOSIS — I2510 Atherosclerotic heart disease of native coronary artery without angina pectoris: Secondary | ICD-10-CM | POA: Diagnosis not present

## 2015-02-03 DIAGNOSIS — E875 Hyperkalemia: Secondary | ICD-10-CM | POA: Diagnosis not present

## 2015-02-03 DIAGNOSIS — N186 End stage renal disease: Secondary | ICD-10-CM | POA: Diagnosis not present

## 2015-02-03 DIAGNOSIS — N2581 Secondary hyperparathyroidism of renal origin: Secondary | ICD-10-CM | POA: Diagnosis not present

## 2015-02-04 DIAGNOSIS — Z992 Dependence on renal dialysis: Secondary | ICD-10-CM | POA: Diagnosis not present

## 2015-02-04 DIAGNOSIS — T8612 Kidney transplant failure: Secondary | ICD-10-CM | POA: Diagnosis not present

## 2015-02-04 DIAGNOSIS — N186 End stage renal disease: Secondary | ICD-10-CM | POA: Diagnosis not present

## 2015-02-05 DIAGNOSIS — N186 End stage renal disease: Secondary | ICD-10-CM | POA: Diagnosis not present

## 2015-02-05 DIAGNOSIS — E875 Hyperkalemia: Secondary | ICD-10-CM | POA: Diagnosis not present

## 2015-02-05 DIAGNOSIS — N2581 Secondary hyperparathyroidism of renal origin: Secondary | ICD-10-CM | POA: Diagnosis not present

## 2015-02-05 DIAGNOSIS — D509 Iron deficiency anemia, unspecified: Secondary | ICD-10-CM | POA: Diagnosis not present

## 2015-02-05 DIAGNOSIS — D631 Anemia in chronic kidney disease: Secondary | ICD-10-CM | POA: Diagnosis not present

## 2015-02-11 DIAGNOSIS — Z952 Presence of prosthetic heart valve: Secondary | ICD-10-CM | POA: Diagnosis not present

## 2015-02-11 DIAGNOSIS — Z9861 Coronary angioplasty status: Secondary | ICD-10-CM | POA: Diagnosis not present

## 2015-02-11 DIAGNOSIS — I2510 Atherosclerotic heart disease of native coronary artery without angina pectoris: Secondary | ICD-10-CM | POA: Diagnosis not present

## 2015-02-11 DIAGNOSIS — I222 Subsequent non-ST elevation (NSTEMI) myocardial infarction: Secondary | ICD-10-CM | POA: Diagnosis not present

## 2015-02-11 DIAGNOSIS — Z951 Presence of aortocoronary bypass graft: Secondary | ICD-10-CM | POA: Diagnosis not present

## 2015-02-12 DIAGNOSIS — I2510 Atherosclerotic heart disease of native coronary artery without angina pectoris: Secondary | ICD-10-CM | POA: Diagnosis not present

## 2015-02-12 DIAGNOSIS — Z952 Presence of prosthetic heart valve: Secondary | ICD-10-CM | POA: Diagnosis not present

## 2015-02-12 DIAGNOSIS — Z9861 Coronary angioplasty status: Secondary | ICD-10-CM | POA: Diagnosis not present

## 2015-02-12 DIAGNOSIS — I222 Subsequent non-ST elevation (NSTEMI) myocardial infarction: Secondary | ICD-10-CM | POA: Diagnosis not present

## 2015-02-12 DIAGNOSIS — Z951 Presence of aortocoronary bypass graft: Secondary | ICD-10-CM | POA: Diagnosis not present

## 2015-02-14 DIAGNOSIS — I222 Subsequent non-ST elevation (NSTEMI) myocardial infarction: Secondary | ICD-10-CM | POA: Diagnosis not present

## 2015-02-14 DIAGNOSIS — Z952 Presence of prosthetic heart valve: Secondary | ICD-10-CM | POA: Diagnosis not present

## 2015-02-14 DIAGNOSIS — I2510 Atherosclerotic heart disease of native coronary artery without angina pectoris: Secondary | ICD-10-CM | POA: Diagnosis not present

## 2015-02-14 DIAGNOSIS — Z9861 Coronary angioplasty status: Secondary | ICD-10-CM | POA: Diagnosis not present

## 2015-02-14 DIAGNOSIS — Z951 Presence of aortocoronary bypass graft: Secondary | ICD-10-CM | POA: Diagnosis not present

## 2015-02-19 ENCOUNTER — Encounter (HOSPITAL_COMMUNITY): Payer: Self-pay

## 2015-02-19 ENCOUNTER — Emergency Department (HOSPITAL_COMMUNITY)
Admission: EM | Admit: 2015-02-19 | Discharge: 2015-02-19 | Disposition: A | Discharge status: Home / Self Care | Payer: Medicare Other | Attending: Emergency Medicine | Admitting: Emergency Medicine

## 2015-02-19 DIAGNOSIS — N186 End stage renal disease: Secondary | ICD-10-CM | POA: Insufficient documentation

## 2015-02-19 DIAGNOSIS — I2510 Atherosclerotic heart disease of native coronary artery without angina pectoris: Secondary | ICD-10-CM | POA: Diagnosis not present

## 2015-02-19 DIAGNOSIS — R079 Chest pain, unspecified: Secondary | ICD-10-CM | POA: Diagnosis not present

## 2015-02-19 DIAGNOSIS — Z9981 Dependence on supplemental oxygen: Secondary | ICD-10-CM | POA: Insufficient documentation

## 2015-02-19 DIAGNOSIS — I4891 Unspecified atrial fibrillation: Secondary | ICD-10-CM | POA: Insufficient documentation

## 2015-02-19 DIAGNOSIS — Z7982 Long term (current) use of aspirin: Secondary | ICD-10-CM | POA: Insufficient documentation

## 2015-02-19 DIAGNOSIS — Z792 Long term (current) use of antibiotics: Secondary | ICD-10-CM | POA: Diagnosis not present

## 2015-02-19 DIAGNOSIS — G4730 Sleep apnea, unspecified: Secondary | ICD-10-CM | POA: Insufficient documentation

## 2015-02-19 DIAGNOSIS — Z9889 Other specified postprocedural states: Secondary | ICD-10-CM | POA: Diagnosis not present

## 2015-02-19 DIAGNOSIS — Z79899 Other long term (current) drug therapy: Secondary | ICD-10-CM | POA: Insufficient documentation

## 2015-02-19 DIAGNOSIS — F419 Anxiety disorder, unspecified: Secondary | ICD-10-CM | POA: Insufficient documentation

## 2015-02-19 DIAGNOSIS — Z880 Allergy status to penicillin: Secondary | ICD-10-CM | POA: Insufficient documentation

## 2015-02-19 DIAGNOSIS — Z87891 Personal history of nicotine dependence: Secondary | ICD-10-CM | POA: Insufficient documentation

## 2015-02-19 DIAGNOSIS — Z992 Dependence on renal dialysis: Secondary | ICD-10-CM | POA: Diagnosis not present

## 2015-02-19 DIAGNOSIS — R000 Tachycardia, unspecified: Secondary | ICD-10-CM | POA: Diagnosis present

## 2015-02-19 NOTE — ED Provider Notes (Signed)
  Face-to-face evaluation   History: Here for evaluation of palpitations, while at dialysis. He is reported to have been in atrial fibrillation with rate of 130. His discomfort improved after they took him off dialysis. He did not have chest pain or problems breathing at the time.  Physical exam: Alert, calm, cooperative. Heart regular rate and rhythm with murmurs at left and right sternal borders. Lungs clear. No peripheral edema. Fistula left arm has normal thrill.    Medical screening examination/treatment/procedure(s) were conducted as a shared visit with non-physician practitioner(s) and myself.  I personally evaluated the patient during the encounter  Daleen Bo, MD 02/20/15 [phone]

## 2015-02-19 NOTE — ED Notes (Signed)
PA at bedside.

## 2015-02-19 NOTE — ED Notes (Signed)
PER EMS: pt from dialysis center, around 0930 to began to feel anxious and his HR increased to 120s. EMS arrived and found to be in afib, hx of afib. A&OX4. BP-148/110. Pt had 3217 of 3400 of his dialysis.

## 2015-02-19 NOTE — ED Provider Notes (Signed)
CSN: [id number]     Arrival date & time 02/19/15  1107 History   First MD Initiated Contact with Patient 02/19/15 1108     Chief Complaint  Patient presents with  . Atrial Fibrillation     (Consider location/radiation/quality/duration/timing/severity/associated sxs/prior Treatment) Patient is a 59 y.o. male presenting with atrial fibrillation. The history is provided by the patient and the EMS personnel. No language interpreter was used.  Atrial Fibrillation Pertinent negatives include no abdominal pain, chest pain, coughing, headaches, nausea, vomiting or weakness.   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 He states he was at dialysis this morning and one of his nurses noted that his heart rate was in the 120s. He states he felt slightly anxious but had no other symptoms. He states he's felt this way several times while at dialysis in the past. He was able to finish dialysis. He denies any recent illness, fever, chills, cough, chest pain, shortness of breath, abdominal pain, nausea, vomiting, diarrhea,, leg swelling.  Past Medical History  Diagnosis Date  . Multiple sclerosis   . CAD (coronary artery disease)   . Aortic heart valve prolapse   . Peripheral vascular disease   . Sleep apnea     ordered bipap with 3L bled in, but pt will not wear  . Atrial fibrillation   . Multiple sclerosis   . 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  He had a renal Tx from 2008 to 2011.  Gets HD in [place], [place] on MWF schedule.     Past Surgical History  Procedure Laterality Date  . Av fistula placement    . Flash      flash pulmonary edema  . Kidney transplant  08/2007  . Hernia repair  07/2011  . Excised squamous cells at  rectum  2006  . Left heart catheterization with coronary angiogram N/A 01/09/2013    Procedure: LEFT HEART CATHETERIZATION WITH CORONARY ANGIOGRAM;  Surgeon: Burnell Blanks, MD;  Location: [facility] CATH LAB;  Service: Cardiovascular;  Laterality: N/A;   No family history on file. History  Substance Use Topics  . Smoking status: Former Smoker -- 2.00 packs/day for 20 years    Types: Cigarettes    Quit date: 08/06/1998  . Smokeless tobacco: Not on file  . Alcohol Use: No    Review of Systems  Respiratory: Negative for cough, chest tightness, shortness of breath and wheezing.   Cardiovascular: Negative for chest pain, palpitations and leg swelling.  Gastrointestinal: Negative for nausea, vomiting and abdominal pain.  Neurological: Negative for dizziness, syncope, weakness, light-headedness and headaches.  All other systems reviewed and are negative.     Allergies  Diphenhydramine; Penicillins; and Adhesive  Home Medications   Prior to Admission medications   Medication Sig Start Date End Date Taking? Authorizing Provider  acetaminophen (TYLENOL) 500 MG tablet Take 1,000 mg by mouth every 6 (six) hours as needed for pain or fever.    Historical Provider, MD  aspirin EC 81 MG tablet Take 81 mg by mouth every other day.     Historical Provider, MD  azithromycin (ZITHROMAX) 250 MG tablet Take 250-500 mg by mouth daily.    Historical Provider, MD  b complex-vitamin c-folic  acid (NEPHRO-VITE) 0.8 MG TABS Take 0.8 mg by mouth daily.     Historical Provider, MD  calcium acetate (PHOSLO) 667 MG capsule Take 4,002 mg by mouth 3 (three) times daily with meals.     Historical Provider, MD  citalopram (CELEXA) 20 MG tablet Take 20 mg by mouth daily.    Historical Provider, MD  clopidogrel (PLAVIX) 75 MG tablet Take 1 tablet (75 mg total) by mouth daily with breakfast. Patient not taking: Reported on 01/08/2015 01/15/13   Burnell Blanks, MD  docusate sodium (COLACE) 100 MG capsule Take 400  mg by mouth 2 (two) times daily.    Historical Provider, MD  folic acid (FOLVITE) 1 MG tablet Take 1 mg by mouth daily.    Historical Provider, MD  gabapentin (NEURONTIN) 300 MG capsule Take 300 mg by mouth at bedtime.     Historical Provider, MD  isosorbide mononitrate (IMDUR) 30 MG 24 hr tablet Take 1 tablet (30 mg total) by mouth daily. Patient not taking: Reported on 01/08/2015 01/15/13   Burnell Blanks, MD  lisinopril (PRINIVIL,ZESTRIL) 20 MG tablet Take 20 mg by mouth daily.    Historical Provider, MD  metoprolol (LOPRESSOR) 50 MG tablet Take 25 mg by mouth 2 (two) times daily.     Historical Provider, MD  multivitamin (RENA-VIT) TABS tablet Take 1 tablet by mouth daily. 01/11/13   Samella Parr, NP  nitroGLYCERIN (NITROSTAT) 0.4 MG SL tablet Place 1 tablet (0.4 mg total) under the tongue every 5 (five) minutes as needed for chest pain (Take one tabe every 5-10 minutes for chest pain. Once you have taken three tabs you need to notify your MD or go to ER if chest pain not gone.). 01/15/13   Burnell Blanks, MD  ranitidine (ZANTAC) 150 MG tablet Take 150 mg by mouth daily.    Historical Provider, MD  simvastatin (ZOCOR) 20 MG tablet Take 10 mg by mouth See admin instructions. Only take '10mg'$  on Sun / Tues / Thurs / Sat (non dialysis days)    Historical Provider, MD  sodium polystyrene (KAYEXALATE) 15 GM/60ML suspension Take 15 g by mouth See admin instructions. Only take 15 g on Sun / Tues / Thurs / Sat (non dialysis days)    Historical Provider, MD   BP 158/91 mmHg  Pulse 73  Temp(Src) 98.3 F (36.8 C) (Oral)  Resp 18  SpO2 93% Physical Exam  Constitutional: He is oriented to person, place, and time. He appears well-developed and well-nourished.  HENT:  Head: Normocephalic and atraumatic.  Eyes: Conjunctivae are normal.  Neck: Normal range of motion. Neck supple.  Cardiovascular: Normal rate, regular rhythm and normal heart sounds.   Pulmonary/Chest: Effort normal and breath  sounds normal. No respiratory distress. He has no wheezes. He has no rales.  Abdominal: Soft. He exhibits no distension. There is no tenderness. There is no rebound and no guarding.  Musculoskeletal: Normal range of motion.  Left arm fistula.   Neurological: He is alert and oriented to person, place, and time.  Skin: Skin is warm and dry.    ED Course  Procedures (including critical care time) Labs Review Labs Reviewed - No data to display  Imaging Review No results found.   EKG Interpretation   Date/Time:  Wednesday February 19 2015 11:14:23 EDT Ventricular Rate:  74 PR Interval:  219 QRS Duration: 114 QT Interval:  423 QTC Calculation: 469 R Axis:   67 Text Interpretation:  Sinus rhythm Prolonged PR interval  Probable left  atrial enlargement Borderline intraventricular conduction delay ST  depression, consider ischemia, lateral lds since last tracing no  significant change Confirmed by Cameron Foster  MD, ELLIOTT [phone]) on 02/19/2015  11:17:44 AM      MDM   Final diagnoses:  Anxiety   Patient presents for feeling anxious while at dialysis today. He states that the nursing staff sent him to the ED due to tachycardia. He states he has had this several times in the past and that his symptoms have resolved on its own and usually occur when he is going through dialysis. His anxiety has resolved and he has no other complaints. No chest pain, no shortness of breath, no abdominal pain, no nausea, no vomiting. He is well appearing, in no acute distress, and his vital signs are stable. He has just been dialyzed. I do not believe he needs any additional labs or imaging at this time. He has no crackles on exam. EKG tracing shows that he was in A. fib when EMS arrived at dialysis but is since converted and is in normal sinus rhythm. He takes Plavix. I discussed this patient with Dr. Eulis Weiss who agrees that the patient does not need a further workup. He can follow up with his primary care  physician.    Cameron Glazier, PA-C 02/19/15 1842  Daleen Bo, MD 02/20/15 [phone]

## 2015-02-21 ENCOUNTER — Emergency Department (HOSPITAL_COMMUNITY)
Admission: EM | Admit: 2015-02-21 | Discharge: 2015-02-21 | Disposition: A | Discharge status: Home / Self Care | Payer: Medicare Other | Attending: Emergency Medicine | Admitting: Emergency Medicine

## 2015-02-21 ENCOUNTER — Emergency Department (HOSPITAL_COMMUNITY): Payer: Medicare Other

## 2015-02-21 ENCOUNTER — Encounter (HOSPITAL_COMMUNITY): Payer: Self-pay

## 2015-02-21 DIAGNOSIS — I480 Paroxysmal atrial fibrillation: Secondary | ICD-10-CM | POA: Diagnosis not present

## 2015-02-21 DIAGNOSIS — Z7982 Long term (current) use of aspirin: Secondary | ICD-10-CM | POA: Diagnosis not present

## 2015-02-21 DIAGNOSIS — Z9889 Other specified postprocedural states: Secondary | ICD-10-CM | POA: Insufficient documentation

## 2015-02-21 DIAGNOSIS — R002 Palpitations: Secondary | ICD-10-CM | POA: Diagnosis present

## 2015-02-21 DIAGNOSIS — Z8669 Personal history of other diseases of the nervous system and sense organs: Secondary | ICD-10-CM | POA: Insufficient documentation

## 2015-02-21 DIAGNOSIS — Z940 Kidney transplant status: Secondary | ICD-10-CM | POA: Diagnosis not present

## 2015-02-21 DIAGNOSIS — I10 Essential (primary) hypertension: Secondary | ICD-10-CM | POA: Insufficient documentation

## 2015-02-21 DIAGNOSIS — Z79899 Other long term (current) drug therapy: Secondary | ICD-10-CM | POA: Diagnosis not present

## 2015-02-21 DIAGNOSIS — R0602 Shortness of breath: Secondary | ICD-10-CM | POA: Diagnosis not present

## 2015-02-21 DIAGNOSIS — Z7951 Long term (current) use of inhaled steroids: Secondary | ICD-10-CM | POA: Insufficient documentation

## 2015-02-21 DIAGNOSIS — I739 Peripheral vascular disease, unspecified: Secondary | ICD-10-CM | POA: Diagnosis not present

## 2015-02-21 DIAGNOSIS — Z7902 Long term (current) use of antithrombotics/antiplatelets: Secondary | ICD-10-CM | POA: Insufficient documentation

## 2015-02-21 DIAGNOSIS — Z992 Dependence on renal dialysis: Secondary | ICD-10-CM | POA: Insufficient documentation

## 2015-02-21 DIAGNOSIS — I2510 Atherosclerotic heart disease of native coronary artery without angina pectoris: Secondary | ICD-10-CM | POA: Diagnosis not present

## 2015-02-21 DIAGNOSIS — N186 End stage renal disease: Secondary | ICD-10-CM | POA: Insufficient documentation

## 2015-02-21 DIAGNOSIS — I25810 Atherosclerosis of coronary artery bypass graft(s) without angina pectoris: Secondary | ICD-10-CM | POA: Insufficient documentation

## 2015-02-21 DIAGNOSIS — Z87891 Personal history of nicotine dependence: Secondary | ICD-10-CM | POA: Diagnosis not present

## 2015-02-21 DIAGNOSIS — Z880 Allergy status to penicillin: Secondary | ICD-10-CM | POA: Diagnosis not present

## 2015-02-21 LAB — CBC
HCT: 32.8 % — ABNORMAL LOW (ref 39.0–52.0)
Hemoglobin: 10.7 g/dL — ABNORMAL LOW (ref 13.0–17.0)
MCH: 32.5 pg (ref 26.0–34.0)
MCHC: 32.6 g/dL (ref 30.0–36.0)
MCV: 99.7 fL (ref 78.0–100.0)
PLATELETS: 174 10*3/uL (ref 150–400)
RBC: 3.29 MIL/uL — ABNORMAL LOW (ref 4.22–5.81)
RDW: 14.4 % (ref 11.5–15.5)
WBC: 6.4 10*3/uL (ref 4.0–10.5)

## 2015-02-21 LAB — BASIC METABOLIC PANEL
ANION GAP: 12 (ref 5–15)
BUN: 17 mg/dL (ref 6–20)
CO2: 32 mmol/L (ref 22–32)
Calcium: 9.8 mg/dL (ref 8.9–10.3)
Chloride: 95 mmol/L — ABNORMAL LOW (ref 101–111)
Creatinine, Ser: 4.32 mg/dL — ABNORMAL HIGH (ref 0.61–1.24)
GFR calc Af Amer: 16 mL/min — ABNORMAL LOW (ref >60)
GFR calc non Af Amer: 14 mL/min — ABNORMAL LOW (ref >60)
Glucose, Bld: 126 mg/dL — ABNORMAL HIGH (ref 65–99)
POTASSIUM: 3.4 mmol/L — AB (ref 3.5–5.1)
SODIUM: 139 mmol/L (ref 135–145)

## 2015-02-21 LAB — BRAIN NATRIURETIC PEPTIDE: B Natriuretic Peptide: 2505.1 pg/mL — ABNORMAL HIGH (ref 0.0–100.0)

## 2015-02-21 LAB — I-STAT TROPONIN, ED: Troponin i, poc: 0.05 ng/mL (ref 0.00–0.08)

## 2015-02-21 NOTE — ED Notes (Signed)
Pt requesting referral to cardiologist in [place] as he normally goes to [place].

## 2015-02-21 NOTE — ED Notes (Signed)
CARDIOLOGY CONSULT NOTE      Patient ID: [name] MRN: [id number] DOB/AGE: Jan 27, 1956 59 y.o.  Admit date: 02/21/2015 Referring PhysicianDavid Tamsen Meek, MD Primary PhysicianDOUGH,ROBERT, MD Primary Cardiologist Dr. 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 He has had a kidney transplant in the past.  He underwent bypass surgery and aortic valve replacement in 2014 at [facility]. He recently underwent cardiac cath showing both his bypass grafts and closed. A stent was placed and he was started on Plavix.  During his past to dialysis treatments, 2 days ago and today, he had paroxysmal atrial fibrillation. This occurred while he was hooked to the machine. After they took him off the machine, he immediately went back to normal sinus rhythm, even before leaving dialysis. Currently, he feels fine. No chest pain or shortness of breath.  Of note, he takes metoprolol twice a day. However, on his dialysis days, he typically waits to after dialysis to take the morning dose of metoprolol. He goes to dialysis very early in the morning at 6 AM.  Review of systems complete and found to be negative unless listed above   Past Medical History  Diagnosis Date  . Multiple sclerosis   . CAD (coronary artery disease)   . Aortic heart valve prolapse   . Peripheral vascular disease   . Sleep apnea     ordered bipap with 3L bled in, but pt will not wear  . Atrial fibrillation   . Multiple sclerosis   . 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  He had a renal Tx from 2008 to 2011.  Gets HD in [place], [place] on MWF schedule.      No family history on file.  History   Social History  . Marital Status: Married    Spouse Name: N/A  .  Number of Children: N/A  . Years of Education: N/A   Occupational History  . Disabled    Social History Main Topics  . Smoking status: Former Smoker -- 2.00 packs/day for 20 years    Types: Cigarettes    Quit date: 08/06/1998  . Smokeless tobacco: Not on file  . Alcohol Use: No  . Drug Use: No  . Sexual Activity: Not on file   Other Topics Concern  . Not on file   Social History Narrative   No history of premature CAD in either parents or siblings. However, his maternal grandfather and 2 maternal uncles had coronary artery disease.    Past Surgical History  Procedure Laterality Date  . Av fistula placement    . Flash      flash pulmonary edema  . Kidney transplant  08/2007  . Hernia repair  07/2011  . Excised squamous cells at rectum  2006  . Left heart catheterization with coronary angiogram N/A 01/09/2013    Procedure: LEFT HEART CATHETERIZATION WITH CORONARY ANGIOGRAM;  Surgeon: Burnell Blanks, MD;  Location: [facility] CATH LAB;  Service: Cardiovascular;  Laterality: N/A;      (Not in a hospital admission)  Physical Exam: Vitals:   Filed Vitals:   02/21/15 1301 02/21/15 1647 02/21/15 1648 02/21/15 1730  BP: 133/82 164/84  157/82  Pulse: 70  111 55  Temp: 98 F (36.7 C)  Resp: '16 21 16 16  '$ SpO2: 96%  98% 96%   I&O's:  No intake or output data in the 24 hours ending 02/21/15 1759 Physical exam: Mildly Chronically ill-appearing [place]/AT EOMI No JVD, No carotid bruit RRR Q8G5 3/6 systolic murmur Mild wheezing Soft. NT, mildly distended No edema. 2+ right radial pulse No focal motor or sensory deficits Normal affect No rash  Labs:   Lab Results  Component Value Date   WBC 6.4 02/21/2015   HGB 10.7* 02/21/2015   HCT 32.8* 02/21/2015   MCV 99.7 02/21/2015   PLT 174 02/21/2015    Recent Labs Lab 02/21/15 1315  NA 139  K 3.4*  CL 95*  CO2 32  BUN 17  CREATININE 4.32*  CALCIUM 9.8  GLUCOSE 126*   Lab Results  Component Value Date   CKTOTAL  46 02/13/2012   CKMB 1.9 02/13/2012   TROPONINI 19.71* 01/08/2015   No results found for: CHOL No results found for: HDL No results found for: LDLCALC No results found for: TRIG No results found for: CHOLHDL No results found for: LDLDIRECT    Radiology: Mild cardiomegaly with interstitial edema EKG: Normal sinus rhythm, lateral ST segment depressions less prominent than prior ECG  ASSESSMENT AND PLAN:  Active Problems:   * No active hospital problems. *  Atrial fibrillation, paroxysmal: We'll have the patient take his metoprolol dose before dialysis. Hopefully, this will prevent any atrial fibrillation during dialysis. If he does have that happen again, he can take an extra half tablet of metoprolol, 25 mg during dialysis.  If this recurs, we'll have to consider increasing the dose of metoprolol before dialysis. Due to his structural heart disease, I suspect his choice of antiarrhythmics would be limited. He'll need an echocardiogram at some point if this has not been done. He would like to be seen here in [place] for convenience. We'll try to obtain records from [facility].  Coronary artery disease: Recent cath with stent placement. Continue Plavix.   This patients CHA2DS2-VASc Score and unadjusted Ischemic Stroke Rate (% per year) is equal to 0.6 % stroke rate/year from a score of 1.  For now, continue Plavix. If his echo shows significant structural heart disease, may need to reconsider for stronger anticoagulation.  He will follow-up in our office.  Above score calculated as 1 point each if present [CHF, HTN, DM, Vascular=MI/PAD/Aortic Plaque, Age if 65-74, or Male] Above score calculated as 2 points each if present [Age > 75, or Stroke/TIA/TE]    Signed:   Mina Marble, MD, [facility] 02/21/2015, 5:59 PM

## 2015-02-21 NOTE — ED Provider Notes (Signed)
CSN: [id number]     Arrival date & time 02/21/15  1253 History   First MD Initiated Contact with Patient 02/21/15 1633     Chief Complaint  Patient presents with  . Irregular Heart Beat     (Consider location/radiation/quality/duration/timing/severity/associated sxs/prior Treatment) The history is provided by the patient.  [name] is a 59 y.o. male hx of CAD, afib on plavix, multiple sclerosis, ESRD on HD (last HD was today), here with palpitations, chest pressure. Patient has been having palpitations during dialysis today. States that his heart rate went up to the 130s. He usually doesn't take metoprolol prior to his dialysis. Has some chest pressure associated with it that went away. He was recently admitted several months ago and had stent placed in [facility].   Past Medical History  Diagnosis Date  . Multiple sclerosis   . CAD (coronary artery disease)   . Aortic heart valve prolapse   . Peripheral vascular disease   . Sleep apnea     ordered bipap with 3L bled in, but pt will not wear  . Atrial fibrillation   . Multiple sclerosis   . 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  He had a renal Tx from 2008 to 2011.  Gets HD in [place], [place] on MWF schedule.     Past Surgical History  Procedure Laterality Date  . Av fistula placement    . Flash      flash pulmonary edema  . Kidney transplant  08/2007  . Hernia repair  07/2011  . Excised squamous cells at rectum  2006  . Left heart catheterization with coronary angiogram N/A 01/09/2013    Procedure: LEFT HEART CATHETERIZATION WITH CORONARY ANGIOGRAM;  Surgeon: Burnell Blanks, MD;  Location: [facility] CATH LAB;  Service: Cardiovascular;  Laterality: N/A;   No family history on file. History  Substance Use Topics  . Smoking status: Former Smoker -- 2.00 packs/day for 20 years    Types: Cigarettes    Quit date:  08/06/1998  . Smokeless tobacco: Not on file  . Alcohol Use: No    Review of Systems  Cardiovascular: Positive for chest pain and palpitations.  All other systems reviewed and are negative.     Allergies  Diphenhydramine; Penicillins; and Adhesive  Home Medications   Prior to Admission medications   Medication Sig Start Date End Date Taking? Authorizing Provider  acetaminophen (TYLENOL) 500 MG tablet Take 1,000 mg by mouth every 6 (six) hours as needed for pain or fever.   Yes Historical Provider, MD  albuterol (PROAIR HFA) 108 (90 BASE) MCG/ACT inhaler Inhale 1-2 puffs into the lungs every 6 (six) hours as needed. 07/19/11  Yes Historical Provider, MD  aspirin EC 81 MG tablet Take 81 mg by mouth daily.    Yes Historical Provider, MD  atorvastatin (LIPITOR) 40 MG tablet Take 40 mg by mouth at bedtime. 01/10/15  Yes Historical Provider, MD  calcium acetate (PHOSLO) 667 MG capsule Take 4,002 mg by mouth 3 (three) times daily with meals.    Yes Historical Provider, MD  citalopram (CELEXA) 20 MG tablet Take 20 mg by mouth daily.   Yes Historical Provider, MD  clopidogrel (PLAVIX) 75 MG tablet Take 1 tablet (75 mg total) by mouth daily with breakfast. 01/15/13  Yes Burnell Blanks, MD  docusate sodium (COLACE) 100 MG capsule Take  400 mg by mouth 2 (two) times daily.   Yes Historical Provider, MD  Fluticasone Furoate-Vilanterol (BREO ELLIPTA) 200-25 MCG/INH AEPB Inhale 1 puff into the lungs daily.   Yes Historical Provider, MD  folic acid (FOLVITE) 1 MG tablet Take 1 mg by mouth daily.   Yes Historical Provider, MD  gabapentin (NEURONTIN) 300 MG capsule Take 300 mg by mouth at bedtime.    Yes Historical Provider, MD  lisinopril (PRINIVIL,ZESTRIL) 20 MG tablet Take 20 mg by mouth daily.   Yes Historical Provider, MD  metoprolol succinate (TOPROL-XL) 50 MG 24 hr tablet Take 50 mg by mouth 2 (two) times daily. 01/10/15  Yes Historical Provider, MD  multivitamin (RENA-VIT) TABS tablet Take 1  tablet by mouth daily. 01/11/13  Yes Samella Parr, NP  nitroGLYCERIN (NITROSTAT) 0.4 MG SL tablet Place 1 tablet (0.4 mg total) under the tongue every 5 (five) minutes as needed for chest pain (Take one tabe every 5-10 minutes for chest pain. Once you have taken three tabs you need to notify your MD or go to ER if chest pain not gone.). 01/15/13  Yes Burnell Blanks, MD  ranitidine (ZANTAC) 150 MG tablet Take 150 mg by mouth daily.   Yes Historical Provider, MD  sodium polystyrene (KAYEXALATE) 15 GM/60ML suspension Take 15 g by mouth See admin instructions. Only take 15 g on Sun / Tues / Thurs / Sat (non dialysis days)   Yes Historical Provider, MD  isosorbide mononitrate (IMDUR) 30 MG 24 hr tablet Take 1 tablet (30 mg total) by mouth daily. Patient not taking: Reported on 02/21/2015 01/15/13   Burnell Blanks, MD   BP 157/82 mmHg  Pulse 55  Temp(Src) 98 F (36.7 C)  Resp 16  SpO2 96% Physical Exam  Constitutional: He is oriented to person, place, and time. He appears well-nourished.  Chronically ill   HENT:  Head: Normocephalic.  Mouth/Throat: Oropharynx is clear and moist.  Eyes: Conjunctivae and EOM are normal. Pupils are equal, round, and reactive to light.  Neck: Normal range of motion. Neck supple.  Cardiovascular: Normal rate, regular rhythm and normal heart sounds.   Pulmonary/Chest: Effort normal and breath sounds normal. No respiratory distress. He has no wheezes. He has no rales.  Abdominal: Soft. Bowel sounds are normal. He exhibits no distension. There is no tenderness. There is no rebound.  Musculoskeletal: Normal range of motion.  Neurological: He is oriented to person, place, and time.  Skin: Skin is warm and dry.  Psychiatric: He has a normal mood and affect. His behavior is normal. Judgment and thought content normal.  Nursing note and vitals reviewed.   ED Course  Procedures (including critical care time) Labs Review Labs Reviewed  CBC - Abnormal;  Notable for the following:    RBC 3.29 (*)    Hemoglobin 10.7 (*)    HCT 32.8 (*)    All other components within normal limits  BASIC METABOLIC PANEL - Abnormal; Notable for the following:    Potassium 3.4 (*)    Chloride 95 (*)    Glucose, Bld 126 (*)    Creatinine, Ser 4.32 (*)    GFR calc non Af Amer 14 (*)    GFR calc Af Amer 16 (*)    All other components within normal limits  BRAIN NATRIURETIC PEPTIDE - Abnormal; Notable for the following:    B Natriuretic Peptide 2505.1 (*)    All other components within normal limits  I-STAT TROPOININ, ED  Imaging Review Dg Chest 2 View  02/21/2015   CLINICAL DATA:  Atrial fibrillation during dialysis. Chest pressure. Mild shortness of breath.  EXAM: CHEST  2 VIEW  COMPARISON:  01/08/2015  FINDINGS: Prior CABG.  Aortic valve prosthesis.  Mild blunting of the right costophrenic angle, stable. Mild enlargement of the cardiopericardial silhouette with interstitial accentuation bilaterally which is mildly increased from prior.  Atherosclerotic and tortuous aortic arch.  Tapering of the peripheral pulmonary vasculature favors emphysema.  Coronary stents noted.  IMPRESSION: 1. Cardiomegaly and interstitial pulmonary edema. 2. Emphysema. 3. Small right pleural effusion.   Electronically Signed   By: Van Clines M.D.   On: 02/21/2015 13:48     EKG Interpretation   Date/Time:  Friday February 21 2015 12:57:56 EDT Ventricular Rate:  68 PR Interval:  186 QRS Duration: 126 QT Interval:  444 QTC Calculation: 472 R Axis:   64 Text Interpretation:  Normal sinus rhythm Possible Left atrial enlargement  Non-specific intra-ventricular conduction block Cannot rule out Anterior  infarct , age undetermined Abnormal ECG No significant change since last  tracing Confirmed by YAO  MD, DAVID ([id number]) on 02/21/2015 4:36:21 PM      MDM   Final diagnoses:  ESRD on hemodialysis  Coronary artery disease involving native coronary artery of native heart  without angina pectoris   [name] is a 59 y.o. male here with chest pressure, afib. Now back in sinus. High risk for ACS. Will consult cardiology regarding management for afib and r/o ACS.    6:35 PM Dr. Irish Lack saw patient and recommend that he takes metoprolol prior to dialysis and can take extra dose if necessary. Cardiology doesn't think he has ACS as he has mostly palpitations instead of chest pressure and had recent cath. Recommend dc with f/u outpatient.    Wandra Arthurs, MD 02/21/15 [phone]

## 2015-02-21 NOTE — ED Notes (Addendum)
Pt reports during his last two dialysis sessions he has been noted to go into A. Fib. Pt reports that during dialysis today he had some mild chest pressure that radiated to neck with mild SOB. Denies complaint currently. States he was instructed by dialysis to be seen here. Pt currently in NSR. NAD.

## 2015-02-21 NOTE — Discharge Instructions (Signed)
Take metoprolol before you go to dialysis. Take extra half pill during dialysis if you have palpitations.   Follow up with Dr. Irish Lack.   Return to ER if you have worse palpitations, chest pain.

## 2015-02-24 DIAGNOSIS — Z951 Presence of aortocoronary bypass graft: Secondary | ICD-10-CM | POA: Diagnosis not present

## 2015-02-24 DIAGNOSIS — Z9861 Coronary angioplasty status: Secondary | ICD-10-CM | POA: Diagnosis not present

## 2015-02-24 DIAGNOSIS — I2510 Atherosclerotic heart disease of native coronary artery without angina pectoris: Secondary | ICD-10-CM | POA: Diagnosis not present

## 2015-02-24 DIAGNOSIS — Z952 Presence of prosthetic heart valve: Secondary | ICD-10-CM | POA: Diagnosis not present

## 2015-02-24 DIAGNOSIS — I222 Subsequent non-ST elevation (NSTEMI) myocardial infarction: Secondary | ICD-10-CM | POA: Diagnosis not present

## 2015-02-28 DIAGNOSIS — I2510 Atherosclerotic heart disease of native coronary artery without angina pectoris: Secondary | ICD-10-CM | POA: Diagnosis not present

## 2015-02-28 DIAGNOSIS — Z952 Presence of prosthetic heart valve: Secondary | ICD-10-CM | POA: Diagnosis not present

## 2015-02-28 DIAGNOSIS — I222 Subsequent non-ST elevation (NSTEMI) myocardial infarction: Secondary | ICD-10-CM | POA: Diagnosis not present

## 2015-02-28 DIAGNOSIS — Z9861 Coronary angioplasty status: Secondary | ICD-10-CM | POA: Diagnosis not present

## 2015-02-28 DIAGNOSIS — Z951 Presence of aortocoronary bypass graft: Secondary | ICD-10-CM | POA: Diagnosis not present

## 2015-03-03 DIAGNOSIS — Z951 Presence of aortocoronary bypass graft: Secondary | ICD-10-CM | POA: Diagnosis not present

## 2015-03-03 DIAGNOSIS — I222 Subsequent non-ST elevation (NSTEMI) myocardial infarction: Secondary | ICD-10-CM | POA: Diagnosis not present

## 2015-03-03 DIAGNOSIS — I2510 Atherosclerotic heart disease of native coronary artery without angina pectoris: Secondary | ICD-10-CM | POA: Diagnosis not present

## 2015-03-03 DIAGNOSIS — Z952 Presence of prosthetic heart valve: Secondary | ICD-10-CM | POA: Diagnosis not present

## 2015-03-03 DIAGNOSIS — Z9861 Coronary angioplasty status: Secondary | ICD-10-CM | POA: Diagnosis not present

## 2015-03-05 DIAGNOSIS — Z9861 Coronary angioplasty status: Secondary | ICD-10-CM | POA: Diagnosis not present

## 2015-03-05 DIAGNOSIS — I222 Subsequent non-ST elevation (NSTEMI) myocardial infarction: Secondary | ICD-10-CM | POA: Diagnosis not present

## 2015-03-05 DIAGNOSIS — I2510 Atherosclerotic heart disease of native coronary artery without angina pectoris: Secondary | ICD-10-CM | POA: Diagnosis not present

## 2015-03-05 DIAGNOSIS — Z951 Presence of aortocoronary bypass graft: Secondary | ICD-10-CM | POA: Diagnosis not present

## 2015-03-05 DIAGNOSIS — Z952 Presence of prosthetic heart valve: Secondary | ICD-10-CM | POA: Diagnosis not present

## 2015-03-06 DIAGNOSIS — N186 End stage renal disease: Secondary | ICD-10-CM | POA: Diagnosis not present

## 2015-03-06 DIAGNOSIS — Z992 Dependence on renal dialysis: Secondary | ICD-10-CM | POA: Diagnosis not present

## 2015-03-06 DIAGNOSIS — T8612 Kidney transplant failure: Secondary | ICD-10-CM | POA: Diagnosis not present

## 2015-03-07 DIAGNOSIS — N2581 Secondary hyperparathyroidism of renal origin: Secondary | ICD-10-CM | POA: Diagnosis not present

## 2015-03-07 DIAGNOSIS — D631 Anemia in chronic kidney disease: Secondary | ICD-10-CM | POA: Diagnosis not present

## 2015-03-07 DIAGNOSIS — E875 Hyperkalemia: Secondary | ICD-10-CM | POA: Diagnosis not present

## 2015-03-07 DIAGNOSIS — D509 Iron deficiency anemia, unspecified: Secondary | ICD-10-CM | POA: Diagnosis not present

## 2015-03-07 DIAGNOSIS — N186 End stage renal disease: Secondary | ICD-10-CM | POA: Diagnosis not present

## 2015-03-10 DIAGNOSIS — D631 Anemia in chronic kidney disease: Secondary | ICD-10-CM | POA: Diagnosis not present

## 2015-03-10 DIAGNOSIS — N2581 Secondary hyperparathyroidism of renal origin: Secondary | ICD-10-CM | POA: Diagnosis not present

## 2015-03-10 DIAGNOSIS — D509 Iron deficiency anemia, unspecified: Secondary | ICD-10-CM | POA: Diagnosis not present

## 2015-03-10 DIAGNOSIS — E875 Hyperkalemia: Secondary | ICD-10-CM | POA: Diagnosis not present

## 2015-03-10 DIAGNOSIS — N186 End stage renal disease: Secondary | ICD-10-CM | POA: Diagnosis not present

## 2015-03-12 DIAGNOSIS — Z9861 Coronary angioplasty status: Secondary | ICD-10-CM | POA: Diagnosis not present

## 2015-03-12 DIAGNOSIS — D631 Anemia in chronic kidney disease: Secondary | ICD-10-CM | POA: Diagnosis not present

## 2015-03-12 DIAGNOSIS — N2581 Secondary hyperparathyroidism of renal origin: Secondary | ICD-10-CM | POA: Diagnosis not present

## 2015-03-12 DIAGNOSIS — E875 Hyperkalemia: Secondary | ICD-10-CM | POA: Diagnosis not present

## 2015-03-12 DIAGNOSIS — I2510 Atherosclerotic heart disease of native coronary artery without angina pectoris: Secondary | ICD-10-CM | POA: Diagnosis not present

## 2015-03-12 DIAGNOSIS — Z951 Presence of aortocoronary bypass graft: Secondary | ICD-10-CM | POA: Diagnosis not present

## 2015-03-12 DIAGNOSIS — D509 Iron deficiency anemia, unspecified: Secondary | ICD-10-CM | POA: Diagnosis not present

## 2015-03-12 DIAGNOSIS — Z952 Presence of prosthetic heart valve: Secondary | ICD-10-CM | POA: Diagnosis not present

## 2015-03-12 DIAGNOSIS — N186 End stage renal disease: Secondary | ICD-10-CM | POA: Diagnosis not present

## 2015-03-12 DIAGNOSIS — I252 Old myocardial infarction: Secondary | ICD-10-CM | POA: Diagnosis not present

## 2015-03-14 DIAGNOSIS — I2510 Atherosclerotic heart disease of native coronary artery without angina pectoris: Secondary | ICD-10-CM | POA: Diagnosis not present

## 2015-03-14 DIAGNOSIS — D509 Iron deficiency anemia, unspecified: Secondary | ICD-10-CM | POA: Diagnosis not present

## 2015-03-14 DIAGNOSIS — E875 Hyperkalemia: Secondary | ICD-10-CM | POA: Diagnosis not present

## 2015-03-14 DIAGNOSIS — D631 Anemia in chronic kidney disease: Secondary | ICD-10-CM | POA: Diagnosis not present

## 2015-03-14 DIAGNOSIS — Z951 Presence of aortocoronary bypass graft: Secondary | ICD-10-CM | POA: Diagnosis not present

## 2015-03-14 DIAGNOSIS — I252 Old myocardial infarction: Secondary | ICD-10-CM | POA: Diagnosis not present

## 2015-03-14 DIAGNOSIS — N2581 Secondary hyperparathyroidism of renal origin: Secondary | ICD-10-CM | POA: Diagnosis not present

## 2015-03-14 DIAGNOSIS — Z952 Presence of prosthetic heart valve: Secondary | ICD-10-CM | POA: Diagnosis not present

## 2015-03-14 DIAGNOSIS — N186 End stage renal disease: Secondary | ICD-10-CM | POA: Diagnosis not present

## 2015-03-14 DIAGNOSIS — Z9861 Coronary angioplasty status: Secondary | ICD-10-CM | POA: Diagnosis not present

## 2015-03-17 DIAGNOSIS — D509 Iron deficiency anemia, unspecified: Secondary | ICD-10-CM | POA: Diagnosis not present

## 2015-03-17 DIAGNOSIS — D631 Anemia in chronic kidney disease: Secondary | ICD-10-CM | POA: Diagnosis not present

## 2015-03-17 DIAGNOSIS — N186 End stage renal disease: Secondary | ICD-10-CM | POA: Diagnosis not present

## 2015-03-17 DIAGNOSIS — N2581 Secondary hyperparathyroidism of renal origin: Secondary | ICD-10-CM | POA: Diagnosis not present

## 2015-03-17 DIAGNOSIS — E875 Hyperkalemia: Secondary | ICD-10-CM | POA: Diagnosis not present

## 2015-03-19 DIAGNOSIS — D509 Iron deficiency anemia, unspecified: Secondary | ICD-10-CM | POA: Diagnosis not present

## 2015-03-19 DIAGNOSIS — I252 Old myocardial infarction: Secondary | ICD-10-CM | POA: Diagnosis not present

## 2015-03-19 DIAGNOSIS — I2510 Atherosclerotic heart disease of native coronary artery without angina pectoris: Secondary | ICD-10-CM | POA: Diagnosis not present

## 2015-03-19 DIAGNOSIS — N186 End stage renal disease: Secondary | ICD-10-CM | POA: Diagnosis not present

## 2015-03-19 DIAGNOSIS — D631 Anemia in chronic kidney disease: Secondary | ICD-10-CM | POA: Diagnosis not present

## 2015-03-19 DIAGNOSIS — Z952 Presence of prosthetic heart valve: Secondary | ICD-10-CM | POA: Diagnosis not present

## 2015-03-19 DIAGNOSIS — E875 Hyperkalemia: Secondary | ICD-10-CM | POA: Diagnosis not present

## 2015-03-19 DIAGNOSIS — Z9861 Coronary angioplasty status: Secondary | ICD-10-CM | POA: Diagnosis not present

## 2015-03-19 DIAGNOSIS — Z951 Presence of aortocoronary bypass graft: Secondary | ICD-10-CM | POA: Diagnosis not present

## 2015-03-19 DIAGNOSIS — N2581 Secondary hyperparathyroidism of renal origin: Secondary | ICD-10-CM | POA: Diagnosis not present

## 2015-03-21 DIAGNOSIS — I252 Old myocardial infarction: Secondary | ICD-10-CM | POA: Diagnosis not present

## 2015-03-21 DIAGNOSIS — D631 Anemia in chronic kidney disease: Secondary | ICD-10-CM | POA: Diagnosis not present

## 2015-03-21 DIAGNOSIS — N186 End stage renal disease: Secondary | ICD-10-CM | POA: Diagnosis not present

## 2015-03-21 DIAGNOSIS — N2581 Secondary hyperparathyroidism of renal origin: Secondary | ICD-10-CM | POA: Diagnosis not present

## 2015-03-21 DIAGNOSIS — E875 Hyperkalemia: Secondary | ICD-10-CM | POA: Diagnosis not present

## 2015-03-21 DIAGNOSIS — Z9861 Coronary angioplasty status: Secondary | ICD-10-CM | POA: Diagnosis not present

## 2015-03-21 DIAGNOSIS — Z951 Presence of aortocoronary bypass graft: Secondary | ICD-10-CM | POA: Diagnosis not present

## 2015-03-21 DIAGNOSIS — Z952 Presence of prosthetic heart valve: Secondary | ICD-10-CM | POA: Diagnosis not present

## 2015-03-21 DIAGNOSIS — D509 Iron deficiency anemia, unspecified: Secondary | ICD-10-CM | POA: Diagnosis not present

## 2015-03-21 DIAGNOSIS — I2510 Atherosclerotic heart disease of native coronary artery without angina pectoris: Secondary | ICD-10-CM | POA: Diagnosis not present

## 2015-03-24 DIAGNOSIS — Z951 Presence of aortocoronary bypass graft: Secondary | ICD-10-CM | POA: Diagnosis not present

## 2015-03-24 DIAGNOSIS — Z9861 Coronary angioplasty status: Secondary | ICD-10-CM | POA: Diagnosis not present

## 2015-03-24 DIAGNOSIS — N2581 Secondary hyperparathyroidism of renal origin: Secondary | ICD-10-CM | POA: Diagnosis not present

## 2015-03-24 DIAGNOSIS — E875 Hyperkalemia: Secondary | ICD-10-CM | POA: Diagnosis not present

## 2015-03-24 DIAGNOSIS — I2510 Atherosclerotic heart disease of native coronary artery without angina pectoris: Secondary | ICD-10-CM | POA: Diagnosis not present

## 2015-03-24 DIAGNOSIS — D509 Iron deficiency anemia, unspecified: Secondary | ICD-10-CM | POA: Diagnosis not present

## 2015-03-24 DIAGNOSIS — N186 End stage renal disease: Secondary | ICD-10-CM | POA: Diagnosis not present

## 2015-03-24 DIAGNOSIS — I252 Old myocardial infarction: Secondary | ICD-10-CM | POA: Diagnosis not present

## 2015-03-24 DIAGNOSIS — D631 Anemia in chronic kidney disease: Secondary | ICD-10-CM | POA: Diagnosis not present

## 2015-03-24 DIAGNOSIS — Z952 Presence of prosthetic heart valve: Secondary | ICD-10-CM | POA: Diagnosis not present

## 2015-03-26 DIAGNOSIS — D631 Anemia in chronic kidney disease: Secondary | ICD-10-CM | POA: Diagnosis not present

## 2015-03-26 DIAGNOSIS — Z952 Presence of prosthetic heart valve: Secondary | ICD-10-CM | POA: Diagnosis not present

## 2015-03-26 DIAGNOSIS — E875 Hyperkalemia: Secondary | ICD-10-CM | POA: Diagnosis not present

## 2015-03-26 DIAGNOSIS — Z9861 Coronary angioplasty status: Secondary | ICD-10-CM | POA: Diagnosis not present

## 2015-03-26 DIAGNOSIS — I2510 Atherosclerotic heart disease of native coronary artery without angina pectoris: Secondary | ICD-10-CM | POA: Diagnosis not present

## 2015-03-26 DIAGNOSIS — N2581 Secondary hyperparathyroidism of renal origin: Secondary | ICD-10-CM | POA: Diagnosis not present

## 2015-03-26 DIAGNOSIS — N186 End stage renal disease: Secondary | ICD-10-CM | POA: Diagnosis not present

## 2015-03-26 DIAGNOSIS — Z951 Presence of aortocoronary bypass graft: Secondary | ICD-10-CM | POA: Diagnosis not present

## 2015-03-26 DIAGNOSIS — I252 Old myocardial infarction: Secondary | ICD-10-CM | POA: Diagnosis not present

## 2015-03-26 DIAGNOSIS — D509 Iron deficiency anemia, unspecified: Secondary | ICD-10-CM | POA: Diagnosis not present

## 2015-03-27 DIAGNOSIS — J441 Chronic obstructive pulmonary disease with (acute) exacerbation: Secondary | ICD-10-CM | POA: Diagnosis not present

## 2015-03-27 DIAGNOSIS — G4737 Central sleep apnea in conditions classified elsewhere: Secondary | ICD-10-CM | POA: Diagnosis not present

## 2015-03-28 DIAGNOSIS — N186 End stage renal disease: Secondary | ICD-10-CM | POA: Diagnosis not present

## 2015-03-28 DIAGNOSIS — D631 Anemia in chronic kidney disease: Secondary | ICD-10-CM | POA: Diagnosis not present

## 2015-03-28 DIAGNOSIS — N2581 Secondary hyperparathyroidism of renal origin: Secondary | ICD-10-CM | POA: Diagnosis not present

## 2015-03-28 DIAGNOSIS — Z952 Presence of prosthetic heart valve: Secondary | ICD-10-CM | POA: Diagnosis not present

## 2015-03-28 DIAGNOSIS — Z9861 Coronary angioplasty status: Secondary | ICD-10-CM | POA: Diagnosis not present

## 2015-03-28 DIAGNOSIS — I252 Old myocardial infarction: Secondary | ICD-10-CM | POA: Diagnosis not present

## 2015-03-28 DIAGNOSIS — Z951 Presence of aortocoronary bypass graft: Secondary | ICD-10-CM | POA: Diagnosis not present

## 2015-03-28 DIAGNOSIS — I2510 Atherosclerotic heart disease of native coronary artery without angina pectoris: Secondary | ICD-10-CM | POA: Diagnosis not present

## 2015-03-28 DIAGNOSIS — D509 Iron deficiency anemia, unspecified: Secondary | ICD-10-CM | POA: Diagnosis not present

## 2015-03-28 DIAGNOSIS — E875 Hyperkalemia: Secondary | ICD-10-CM | POA: Diagnosis not present

## 2015-03-31 DIAGNOSIS — Z951 Presence of aortocoronary bypass graft: Secondary | ICD-10-CM | POA: Diagnosis not present

## 2015-03-31 DIAGNOSIS — N186 End stage renal disease: Secondary | ICD-10-CM | POA: Diagnosis not present

## 2015-03-31 DIAGNOSIS — Z952 Presence of prosthetic heart valve: Secondary | ICD-10-CM | POA: Diagnosis not present

## 2015-03-31 DIAGNOSIS — Z9861 Coronary angioplasty status: Secondary | ICD-10-CM | POA: Diagnosis not present

## 2015-03-31 DIAGNOSIS — N2581 Secondary hyperparathyroidism of renal origin: Secondary | ICD-10-CM | POA: Diagnosis not present

## 2015-03-31 DIAGNOSIS — E875 Hyperkalemia: Secondary | ICD-10-CM | POA: Diagnosis not present

## 2015-03-31 DIAGNOSIS — I2510 Atherosclerotic heart disease of native coronary artery without angina pectoris: Secondary | ICD-10-CM | POA: Diagnosis not present

## 2015-03-31 DIAGNOSIS — I252 Old myocardial infarction: Secondary | ICD-10-CM | POA: Diagnosis not present

## 2015-03-31 DIAGNOSIS — D631 Anemia in chronic kidney disease: Secondary | ICD-10-CM | POA: Diagnosis not present

## 2015-03-31 DIAGNOSIS — D509 Iron deficiency anemia, unspecified: Secondary | ICD-10-CM | POA: Diagnosis not present

## 2015-04-02 DIAGNOSIS — E875 Hyperkalemia: Secondary | ICD-10-CM | POA: Diagnosis not present

## 2015-04-02 DIAGNOSIS — Z9861 Coronary angioplasty status: Secondary | ICD-10-CM | POA: Diagnosis not present

## 2015-04-02 DIAGNOSIS — Z951 Presence of aortocoronary bypass graft: Secondary | ICD-10-CM | POA: Diagnosis not present

## 2015-04-02 DIAGNOSIS — I252 Old myocardial infarction: Secondary | ICD-10-CM | POA: Diagnosis not present

## 2015-04-02 DIAGNOSIS — N186 End stage renal disease: Secondary | ICD-10-CM | POA: Diagnosis not present

## 2015-04-02 DIAGNOSIS — I2510 Atherosclerotic heart disease of native coronary artery without angina pectoris: Secondary | ICD-10-CM | POA: Diagnosis not present

## 2015-04-02 DIAGNOSIS — N2581 Secondary hyperparathyroidism of renal origin: Secondary | ICD-10-CM | POA: Diagnosis not present

## 2015-04-02 DIAGNOSIS — D631 Anemia in chronic kidney disease: Secondary | ICD-10-CM | POA: Diagnosis not present

## 2015-04-02 DIAGNOSIS — D509 Iron deficiency anemia, unspecified: Secondary | ICD-10-CM | POA: Diagnosis not present

## 2015-04-02 DIAGNOSIS — Z952 Presence of prosthetic heart valve: Secondary | ICD-10-CM | POA: Diagnosis not present

## 2015-04-04 DIAGNOSIS — D509 Iron deficiency anemia, unspecified: Secondary | ICD-10-CM | POA: Diagnosis not present

## 2015-04-04 DIAGNOSIS — E875 Hyperkalemia: Secondary | ICD-10-CM | POA: Diagnosis not present

## 2015-04-04 DIAGNOSIS — N2581 Secondary hyperparathyroidism of renal origin: Secondary | ICD-10-CM | POA: Diagnosis not present

## 2015-04-04 DIAGNOSIS — D631 Anemia in chronic kidney disease: Secondary | ICD-10-CM | POA: Diagnosis not present

## 2015-04-04 DIAGNOSIS — N186 End stage renal disease: Secondary | ICD-10-CM | POA: Diagnosis not present

## 2015-04-06 DIAGNOSIS — Z992 Dependence on renal dialysis: Secondary | ICD-10-CM | POA: Diagnosis not present

## 2015-04-06 DIAGNOSIS — N186 End stage renal disease: Secondary | ICD-10-CM | POA: Diagnosis not present

## 2015-04-06 DIAGNOSIS — T8612 Kidney transplant failure: Secondary | ICD-10-CM | POA: Diagnosis not present

## 2015-04-07 DIAGNOSIS — F329 Major depressive disorder, single episode, unspecified: Secondary | ICD-10-CM | POA: Diagnosis not present

## 2015-04-07 DIAGNOSIS — N186 End stage renal disease: Secondary | ICD-10-CM | POA: Diagnosis not present

## 2015-04-07 DIAGNOSIS — E784 Other hyperlipidemia: Secondary | ICD-10-CM | POA: Diagnosis not present

## 2015-04-07 DIAGNOSIS — D509 Iron deficiency anemia, unspecified: Secondary | ICD-10-CM | POA: Diagnosis not present

## 2015-04-07 DIAGNOSIS — E875 Hyperkalemia: Secondary | ICD-10-CM | POA: Diagnosis not present

## 2015-04-07 DIAGNOSIS — I2510 Atherosclerotic heart disease of native coronary artery without angina pectoris: Secondary | ICD-10-CM | POA: Diagnosis not present

## 2015-04-07 DIAGNOSIS — G2581 Restless legs syndrome: Secondary | ICD-10-CM | POA: Diagnosis not present

## 2015-04-07 DIAGNOSIS — N2581 Secondary hyperparathyroidism of renal origin: Secondary | ICD-10-CM | POA: Diagnosis not present

## 2015-04-07 DIAGNOSIS — I10 Essential (primary) hypertension: Secondary | ICD-10-CM | POA: Diagnosis not present

## 2015-04-08 ENCOUNTER — Encounter: Payer: Self-pay | Admitting: Physician Assistant

## 2015-04-08 ENCOUNTER — Ambulatory Visit (INDEPENDENT_AMBULATORY_CARE_PROVIDER_SITE_OTHER): Payer: Medicare Other | Admitting: Physician Assistant

## 2015-04-08 VITALS — BP 180/82 | HR 54 | Ht 70.0 in | Wt 182.8 lb

## 2015-04-08 DIAGNOSIS — N186 End stage renal disease: Secondary | ICD-10-CM

## 2015-04-08 DIAGNOSIS — I25810 Atherosclerosis of coronary artery bypass graft(s) without angina pectoris: Secondary | ICD-10-CM | POA: Diagnosis not present

## 2015-04-08 DIAGNOSIS — I10 Essential (primary) hypertension: Secondary | ICD-10-CM

## 2015-04-08 DIAGNOSIS — R0602 Shortness of breath: Secondary | ICD-10-CM

## 2015-04-08 DIAGNOSIS — I480 Paroxysmal atrial fibrillation: Secondary | ICD-10-CM

## 2015-04-08 DIAGNOSIS — G4733 Obstructive sleep apnea (adult) (pediatric): Secondary | ICD-10-CM

## 2015-04-08 MED ORDER — METOPROLOL SUCCINATE ER 50 MG PO TB24: 50.0000 mg | ORAL_TABLET | Freq: Two times a day (BID) | ORAL | Status: DC

## 2015-04-08 MED ORDER — ATORVASTATIN CALCIUM 40 MG PO TABS: 40.0000 mg | ORAL_TABLET | Freq: Every day | ORAL | Status: DC

## 2015-04-08 MED ORDER — LISINOPRIL 20 MG PO TABS: 20.0000 mg | ORAL_TABLET | Freq: Every day | ORAL | Status: DC

## 2015-04-08 MED ORDER — CLOPIDOGREL BISULFATE 75 MG PO TABS: 75.0000 mg | ORAL_TABLET | Freq: Every day | ORAL | Status: DC

## 2015-04-08 NOTE — Assessment & Plan Note (Signed)
Blood pressure is elevated today. The patient did not take his lisinopril last night because he slept through it. He will take it when he gets home.

## 2015-04-08 NOTE — Assessment & Plan Note (Signed)
No complaints of angina.  On aspirin, Plavix, metoprolol, Lipitor

## 2015-04-08 NOTE — Progress Notes (Signed)
Patient ID: [name], male   DOB: 07-Feb-1956, 59 y.o.   MRN: [id number]    Date:  04/08/2015   ID:  [name], DOB [birth year], MRN [id number]  PCP:  Teressa Lower, MD  Primary Cardiologist: Angelena Form, [place]([facility])    Chief Complaint  Patient presents with  . follow up/ establish care     History of Present Illness: [name] is a 59 y.o. male history of coronary artery disease, coronary artery bypass grafting August 2014 SVG to the left anterior descending, aortic valve replacement-Edwards 23 mm bioprosthetic valve August 2014 paroxysmal atrial fibrillation, end-stage renal disease, renal transplant and failure, multiple sclerosis, sleep apnea (does not use his BiPAP).   He was hospitalized in May 2016 with an acute coronary syndrome, and underwent percutaneous revascularization of the circumflex and of the saphenous vein graft to the left anterior descending coronary artery. He has generally felt well since his hospital discharge.  She has had approximate 3 recurrences of atrial fibrillation which typically occur during dialysis sessions and only last a short period of time. He has discussed anticoagulation with Dr. Delight Hoh and was seen in the emergency room by Dr. Irish Lack on 03/06/2015.  CHADSVASC = 2  She presented today to reestablish care [facility] heart [facility].  He originally had a left heart catheterization by Dr. Angelena Form May 2014.  Patient has no particular complaints today. He did miss his lisinopril last night which likely explains his elevated blood pressure.  The patient currently denies nausea, vomiting, fever, chest pain, shortness of breath, orthopnea, dizziness, PND, cough, congestion, abdominal pain, hematochezia, melena, lower extremity edema, claudication.    PMH Aortic valve disease with infective endocarditis  Aortic valve replacement with a 23 mm stented Edwards bioprosthetic valve on 04/30/2013  Coronary heart disease  Coronary bypass graft surgery with  saphenous vein graft to the left anterior descending and a diagonal branch on 04/30/2013  Percutaneous revascularization of a mid circumflex lesion with a resolute drug-eluting stent on 5/5/2 to his 2016 Percutaneous revascularization of the saphenous vein grafts to LAD with a Herculink bare metal stent on 01/09/2015 Paroxysmal atrial fibrillation    Wt Readings from Last 3 Encounters:  04/08/15 182 lb 12.8 oz (82.918 kg)  01/08/15 177 lb 4 oz (80.4 kg)  01/11/13 171 lb 4.8 oz (77.7 kg)     Past Medical History  Diagnosis Date  . Multiple sclerosis   . CAD (coronary artery disease)   . Aortic heart valve prolapse   . Peripheral vascular disease   . Sleep apnea     ordered bipap with 3L bled in, but pt will not wear  . Atrial fibrillation   . Multiple sclerosis   . 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  He had a renal Tx from 2008 to 2011.  Gets HD in [place], [place] on MWF schedule.      Current Outpatient Prescriptions  Medication Sig Dispense Refill  . acetaminophen (TYLENOL) 500 MG tablet Take 1,000 mg by mouth every 6 (six) hours as needed for pain or fever.    Marland Kitchen albuterol (PROAIR HFA) 108 (90 BASE) MCG/ACT inhaler Inhale 1-2 puffs into the lungs every 6 (six) hours as needed.    Marland Kitchen aspirin EC 81 MG tablet Take 81 mg by mouth daily.     Marland Kitchen atorvastatin (LIPITOR) 40 MG tablet Take 1 tablet (40  mg total) by mouth at bedtime. 90 tablet 3  . B Complex-C-Folic Acid (RENA-VITE RX) 1 MG TABS Take 1 tablet by mouth.  3  . calcium acetate (PHOSLO) 667 MG capsule Take 4,002 mg by mouth 3 (three) times daily with meals.     . citalopram (CELEXA) 20 MG tablet Take 20 mg by mouth daily.    . clopidogrel (PLAVIX) 75 MG tablet Take 1 tablet (75 mg total) by mouth daily with breakfast. 90 tablet 3  . docusate sodium (COLACE) 100 MG capsule Take 400 mg by mouth 2 (two) times  daily.    . Fluticasone Furoate-Vilanterol (BREO ELLIPTA) 200-25 MCG/INH AEPB Inhale 1 puff into the lungs daily.    . folic acid (FOLVITE) 1 MG tablet Take 1 mg by mouth daily.    Marland Kitchen gabapentin (NEURONTIN) 300 MG capsule Take 300 mg by mouth at bedtime.     Marland Kitchen lisinopril (PRINIVIL,ZESTRIL) 20 MG tablet Take 1 tablet (20 mg total) by mouth daily. 90 tablet 3  . metoprolol succinate (TOPROL-XL) 50 MG 24 hr tablet Take 1 tablet (50 mg total) by mouth 2 (two) times daily. 180 tablet 3  . multivitamin (RENA-VIT) TABS tablet Take 1 tablet by mouth daily. 30 tablet 1  . nitroGLYCERIN (NITROSTAT) 0.4 MG SL tablet Place 1 tablet (0.4 mg total) under the tongue every 5 (five) minutes as needed for chest pain (Take one tabe every 5-10 minutes for chest pain. Once you have taken three tabs you need to notify your MD or go to ER if chest pain not gone.). 30 tablet 0  . ranitidine (ZANTAC) 150 MG tablet Take 150 mg by mouth daily.    . sodium polystyrene (KAYEXALATE) 15 GM/60ML suspension Take 15 g by mouth See admin instructions. Only take 15 g on Sun / Tues / Thurs / Sat (non dialysis days)     No current facility-administered medications for this visit.    Allergies:    Allergies  Allergen Reactions  . Diphenhydramine Itching    Only with IV doses. Tolerates oral.  . Penicillins Other (See Comments)    migraine  . Adhesive [Tape] Rash    Please use paper tape    Social History:  The patient  reports that he quit smoking about 16 years ago. His smoking use included Cigarettes. He has a 40 pack-year smoking history. He does not have any smokeless tobacco history on file. He reports that he does not drink alcohol or use illicit drugs.   Family history:  No family history on file.  ROS:  Please see the history of present illness.  All other systems reviewed and negative.   PHYSICAL EXAM: VS:  BP 180/82 mmHg  Pulse 54  Ht '5\' 10"'$  (1.778 m)  Wt 182 lb 12.8 oz (82.918 kg)  BMI 26.23 kg/m2 Well  nourished, well developed, in no acute distress HEENT: Pupils are equal round react to light accommodation extraocular movements are intact.  Neck: no JVDNo cervical lymphadenopathy. Cardiac: Regular rate and rhythm with 1/6 systolic murmur RSB radiates to the carotid arteries. Lungs:  clear to auscultation bilaterally, no wheezing, rhonchi or rales Abd: soft, nontender, positive bowel sounds all quadrants, no hepatosplenomegaly Ext: Trace lower extremity edema.  2+ radial and dorsalis pedis pulses. Skin: warm and dry Neuro:  Grossly normal   ASSESSMENT AND PLAN:  Problem List Items Addressed This Visit    PAF (paroxysmal atrial fibrillation)    Patient appears to be in sinus rhythm/bradycardia rate 54  bpm. Stent anticoagulation again. His episodes seem to be better controlled now that he's change the time of day or he takes his Toprol. He is also taking it twice a day which hopefully will even out its effects. His untreated sleep apnea does not help the situation. In May of this year he underwent stenting and is on aspirin Plavix. He begins to have more frequent episodes likely need to drop the aspirin and add Coumadin.  CHADSVASC= 2.  Going to check a 2-D echocardiogram.       Relevant Medications   atorvastatin (LIPITOR) 40 MG tablet   lisinopril (PRINIVIL,ZESTRIL) 20 MG tablet   metoprolol succinate (TOPROL-XL) 50 MG 24 hr tablet   OSA (obstructive sleep apnea)    Patient does not use his BiPAP device. We discussed the importance of using this machine when he sleeping. He had only been using his oxygen concentrator which I'm sure is not enough. Is also likely putting undue stress on his heart which may be triggering atrial fibrillation.  His EKG shows signs of atrial enlargement.  We did ambulatory O2 saturations on by walking him around the nurse's station several times. His oxygen dropped to 89%. This does not qualify him for home oxygen. His wife stated that his oxygen saturations  really dropped when he had his sleep study. Again, all the more reason to be using his BiPAP device.      HTN (hypertension)    Blood pressure is elevated today. The patient did not take his lisinopril last night because he slept through it. He will take it when he gets home.      Relevant Medications   atorvastatin (LIPITOR) 40 MG tablet   lisinopril (PRINIVIL,ZESTRIL) 20 MG tablet   metoprolol succinate (TOPROL-XL) 50 MG 24 hr tablet   ESRD on hemodialysis   CAD (coronary artery disease)    No complaints of angina.  On aspirin, Plavix, metoprolol, Lipitor      Relevant Medications   atorvastatin (LIPITOR) 40 MG tablet   lisinopril (PRINIVIL,ZESTRIL) 20 MG tablet   metoprolol succinate (TOPROL-XL) 50 MG 24 hr tablet   Other Relevant Orders   ECHOCARDIOGRAM COMPLETE   Atrial fibrillation - Primary   Relevant Medications   atorvastatin (LIPITOR) 40 MG tablet   lisinopril (PRINIVIL,ZESTRIL) 20 MG tablet   metoprolol succinate (TOPROL-XL) 50 MG 24 hr tablet   Other Relevant Orders   ECHOCARDIOGRAM COMPLETE    Other Visit Diagnoses    SOB (shortness of breath)        Relevant Orders    ECHOCARDIOGRAM COMPLETE

## 2015-04-08 NOTE — Assessment & Plan Note (Addendum)
Patient appears to be in sinus rhythm/bradycardia rate 54 bpm. Stent anticoagulation again. His episodes seem to be better controlled now that he's change the time of day or he takes his Toprol. He is also taking it twice a day which hopefully will even out its effects. His untreated sleep apnea does not help the situation. In May of this year he underwent stenting and is on aspirin Plavix. He begins to have more frequent episodes likely need to drop the aspirin and add Coumadin.  CHADSVASC= 2.  Going to check a 2-D echocardiogram.

## 2015-04-08 NOTE — Assessment & Plan Note (Signed)
Patient does not use his BiPAP device. We discussed the importance of using this machine when he sleeping. He had only been using his oxygen concentrator which I'm sure is not enough. Is also likely putting undue stress on his heart which may be triggering atrial fibrillation.  His EKG shows signs of atrial enlargement.  We did ambulatory O2 saturations on by walking him around the nurse's station several times. His oxygen dropped to 89%. This does not qualify him for home oxygen. His wife stated that his oxygen saturations really dropped when he had his sleep study. Again, all the more reason to be using his BiPAP device.

## 2015-04-08 NOTE — Patient Instructions (Signed)
Your physician recommends that you schedule a follow-up appointment in: 3 months with Dr. Angelena Form.  Your physician has requested that you have an echocardiogram. Echocardiography is a painless test that uses sound waves to create images of your heart. It provides your doctor with information about the size and shape of your heart and how well your heart's chambers and valves are working. This procedure takes approximately one hour. There are no restrictions for this procedure.

## 2015-04-09 DIAGNOSIS — Z9861 Coronary angioplasty status: Secondary | ICD-10-CM | POA: Diagnosis not present

## 2015-04-09 DIAGNOSIS — I252 Old myocardial infarction: Secondary | ICD-10-CM | POA: Diagnosis not present

## 2015-04-11 ENCOUNTER — Telehealth: Payer: Self-pay

## 2015-04-11 DIAGNOSIS — I252 Old myocardial infarction: Secondary | ICD-10-CM | POA: Diagnosis not present

## 2015-04-11 DIAGNOSIS — Z9861 Coronary angioplasty status: Secondary | ICD-10-CM | POA: Diagnosis not present

## 2015-04-11 NOTE — Telephone Encounter (Signed)
Prior auth for Clopidogrel 75 mg sent to [facility] via Cover My meds.

## 2015-04-14 ENCOUNTER — Other Ambulatory Visit: Payer: Self-pay

## 2015-04-14 ENCOUNTER — Ambulatory Visit (HOSPITAL_COMMUNITY): Payer: Medicare Other | Attending: Physician Assistant

## 2015-04-14 DIAGNOSIS — R0602 Shortness of breath: Secondary | ICD-10-CM

## 2015-04-14 DIAGNOSIS — I517 Cardiomegaly: Secondary | ICD-10-CM | POA: Insufficient documentation

## 2015-04-14 DIAGNOSIS — I25810 Atherosclerosis of coronary artery bypass graft(s) without angina pectoris: Secondary | ICD-10-CM

## 2015-04-14 DIAGNOSIS — I351 Nonrheumatic aortic (valve) insufficiency: Secondary | ICD-10-CM | POA: Diagnosis not present

## 2015-04-14 DIAGNOSIS — I4891 Unspecified atrial fibrillation: Secondary | ICD-10-CM | POA: Diagnosis present

## 2015-04-14 DIAGNOSIS — I340 Nonrheumatic mitral (valve) insufficiency: Secondary | ICD-10-CM | POA: Diagnosis not present

## 2015-04-14 DIAGNOSIS — I480 Paroxysmal atrial fibrillation: Secondary | ICD-10-CM | POA: Diagnosis not present

## 2015-04-16 DIAGNOSIS — Z9861 Coronary angioplasty status: Secondary | ICD-10-CM | POA: Diagnosis not present

## 2015-04-16 DIAGNOSIS — I252 Old myocardial infarction: Secondary | ICD-10-CM | POA: Diagnosis not present

## 2015-04-18 DIAGNOSIS — Z9861 Coronary angioplasty status: Secondary | ICD-10-CM | POA: Diagnosis not present

## 2015-04-18 DIAGNOSIS — I252 Old myocardial infarction: Secondary | ICD-10-CM | POA: Diagnosis not present

## 2015-04-21 DIAGNOSIS — I252 Old myocardial infarction: Secondary | ICD-10-CM | POA: Diagnosis not present

## 2015-04-21 DIAGNOSIS — Z9861 Coronary angioplasty status: Secondary | ICD-10-CM | POA: Diagnosis not present

## 2015-04-23 DIAGNOSIS — I252 Old myocardial infarction: Secondary | ICD-10-CM | POA: Diagnosis not present

## 2015-04-23 DIAGNOSIS — Z9861 Coronary angioplasty status: Secondary | ICD-10-CM | POA: Diagnosis not present

## 2015-04-25 DIAGNOSIS — Z9861 Coronary angioplasty status: Secondary | ICD-10-CM | POA: Diagnosis not present

## 2015-04-25 DIAGNOSIS — I252 Old myocardial infarction: Secondary | ICD-10-CM | POA: Diagnosis not present

## 2015-04-28 DIAGNOSIS — I252 Old myocardial infarction: Secondary | ICD-10-CM | POA: Diagnosis not present

## 2015-04-28 DIAGNOSIS — Z9861 Coronary angioplasty status: Secondary | ICD-10-CM | POA: Diagnosis not present

## 2015-04-30 DIAGNOSIS — I252 Old myocardial infarction: Secondary | ICD-10-CM | POA: Diagnosis not present

## 2015-04-30 DIAGNOSIS — Z9861 Coronary angioplasty status: Secondary | ICD-10-CM | POA: Diagnosis not present

## 2015-05-02 DIAGNOSIS — I252 Old myocardial infarction: Secondary | ICD-10-CM | POA: Diagnosis not present

## 2015-05-02 DIAGNOSIS — Z9861 Coronary angioplasty status: Secondary | ICD-10-CM | POA: Diagnosis not present

## 2015-05-05 DIAGNOSIS — Z9861 Coronary angioplasty status: Secondary | ICD-10-CM | POA: Diagnosis not present

## 2015-05-05 DIAGNOSIS — I252 Old myocardial infarction: Secondary | ICD-10-CM | POA: Diagnosis not present

## 2015-05-07 DIAGNOSIS — N186 End stage renal disease: Secondary | ICD-10-CM | POA: Diagnosis not present

## 2015-05-07 DIAGNOSIS — Z992 Dependence on renal dialysis: Secondary | ICD-10-CM | POA: Diagnosis not present

## 2015-05-07 DIAGNOSIS — T8612 Kidney transplant failure: Secondary | ICD-10-CM | POA: Diagnosis not present

## 2015-05-09 DIAGNOSIS — Z9861 Coronary angioplasty status: Secondary | ICD-10-CM | POA: Diagnosis not present

## 2015-05-09 DIAGNOSIS — N2581 Secondary hyperparathyroidism of renal origin: Secondary | ICD-10-CM | POA: Diagnosis not present

## 2015-05-09 DIAGNOSIS — E875 Hyperkalemia: Secondary | ICD-10-CM | POA: Diagnosis not present

## 2015-05-09 DIAGNOSIS — D509 Iron deficiency anemia, unspecified: Secondary | ICD-10-CM | POA: Diagnosis not present

## 2015-05-09 DIAGNOSIS — I252 Old myocardial infarction: Secondary | ICD-10-CM | POA: Diagnosis not present

## 2015-05-09 DIAGNOSIS — N186 End stage renal disease: Secondary | ICD-10-CM | POA: Diagnosis not present

## 2015-05-13 DIAGNOSIS — J441 Chronic obstructive pulmonary disease with (acute) exacerbation: Secondary | ICD-10-CM | POA: Diagnosis not present

## 2015-05-19 ENCOUNTER — Telehealth: Payer: Self-pay | Admitting: Cardiovascular Disease

## 2015-05-19 MED ORDER — METOPROLOL SUCCINATE ER 25 MG PO TB24: 25.0000 mg | ORAL_TABLET | Freq: Two times a day (BID) | ORAL | Status: DC

## 2015-05-19 NOTE — Telephone Encounter (Signed)
I would cut the Toprol back to 25 mg po BID. chris

## 2015-05-19 NOTE — Telephone Encounter (Signed)
Spoke with pt.  He reports blood pressure is always low after dialysis but recently has also been low on non dialysis days.  Attends cardiac rehab in [place] and BP has been low there also at times.  Kidney doctor and rehab wanted him to contact Dr. Angelena Form about possibly lowering metoprolol dose.  Pt reports blood pressure has been running 90-110/60-70.  He has some dizziness when standing but his resolves after about 15 seconds.  Pt reports heart rate has been running 60-70. No recent episodes of atrial fib that he is aware of.  Pt is no longer following with cardiologist in [facility].

## 2015-05-19 NOTE — Telephone Encounter (Signed)
New message      Wife states that Dr Edrick Oh want [place] to consider dropping one dosage of metoprolol because of low bp.  He currently takes '50mg'$  in am and '50mg'$  in pm.  Dialysis center number is [phone] to get bp readings---per wife.

## 2015-05-19 NOTE — Telephone Encounter (Signed)
Spoke with pt and gave him instructions from Dr. McAlhany.   

## 2015-05-20 DIAGNOSIS — R911 Solitary pulmonary nodule: Secondary | ICD-10-CM | POA: Diagnosis not present

## 2015-05-20 DIAGNOSIS — R918 Other nonspecific abnormal finding of lung field: Secondary | ICD-10-CM | POA: Diagnosis not present

## 2015-05-21 ENCOUNTER — Encounter (HOSPITAL_COMMUNITY): Payer: Self-pay | Admitting: Emergency Medicine

## 2015-05-21 ENCOUNTER — Inpatient Hospital Stay (HOSPITAL_COMMUNITY)
Admission: EM | Admit: 2015-05-21 | Discharge: 2015-05-27 | DRG: 286 | Disposition: A | Discharge status: Home / Self Care | Payer: Medicare Other | Attending: Cardiology | Admitting: Cardiology

## 2015-05-21 DIAGNOSIS — I10 Essential (primary) hypertension: Secondary | ICD-10-CM | POA: Diagnosis not present

## 2015-05-21 DIAGNOSIS — Z953 Presence of xenogenic heart valve: Secondary | ICD-10-CM

## 2015-05-21 DIAGNOSIS — G35 Multiple sclerosis: Secondary | ICD-10-CM | POA: Diagnosis present

## 2015-05-21 DIAGNOSIS — N2581 Secondary hyperparathyroidism of renal origin: Secondary | ICD-10-CM | POA: Diagnosis not present

## 2015-05-21 DIAGNOSIS — R079 Chest pain, unspecified: Secondary | ICD-10-CM

## 2015-05-21 DIAGNOSIS — I252 Old myocardial infarction: Secondary | ICD-10-CM

## 2015-05-21 DIAGNOSIS — E875 Hyperkalemia: Secondary | ICD-10-CM | POA: Diagnosis present

## 2015-05-21 DIAGNOSIS — I951 Orthostatic hypotension: Secondary | ICD-10-CM | POA: Diagnosis not present

## 2015-05-21 DIAGNOSIS — N186 End stage renal disease: Secondary | ICD-10-CM | POA: Diagnosis not present

## 2015-05-21 DIAGNOSIS — I4892 Unspecified atrial flutter: Secondary | ICD-10-CM

## 2015-05-21 DIAGNOSIS — I480 Paroxysmal atrial fibrillation: Secondary | ICD-10-CM | POA: Diagnosis not present

## 2015-05-21 DIAGNOSIS — I4891 Unspecified atrial fibrillation: Secondary | ICD-10-CM | POA: Insufficient documentation

## 2015-05-21 DIAGNOSIS — I483 Typical atrial flutter: Secondary | ICD-10-CM | POA: Diagnosis present

## 2015-05-21 DIAGNOSIS — I484 Atypical atrial flutter: Secondary | ICD-10-CM | POA: Diagnosis not present

## 2015-05-21 DIAGNOSIS — Z992 Dependence on renal dialysis: Secondary | ICD-10-CM | POA: Diagnosis not present

## 2015-05-21 DIAGNOSIS — I2510 Atherosclerotic heart disease of native coronary artery without angina pectoris: Secondary | ICD-10-CM | POA: Diagnosis present

## 2015-05-21 DIAGNOSIS — I739 Peripheral vascular disease, unspecified: Secondary | ICD-10-CM | POA: Diagnosis present

## 2015-05-21 DIAGNOSIS — Z9861 Coronary angioplasty status: Secondary | ICD-10-CM

## 2015-05-21 DIAGNOSIS — Z87891 Personal history of nicotine dependence: Secondary | ICD-10-CM

## 2015-05-21 DIAGNOSIS — R404 Transient alteration of awareness: Secondary | ICD-10-CM | POA: Diagnosis not present

## 2015-05-21 DIAGNOSIS — R9439 Abnormal result of other cardiovascular function study: Secondary | ICD-10-CM

## 2015-05-21 DIAGNOSIS — Z955 Presence of coronary angioplasty implant and graft: Secondary | ICD-10-CM

## 2015-05-21 DIAGNOSIS — I120 Hypertensive chronic kidney disease with stage 5 chronic kidney disease or end stage renal disease: Secondary | ICD-10-CM | POA: Diagnosis present

## 2015-05-21 DIAGNOSIS — Z7902 Long term (current) use of antithrombotics/antiplatelets: Secondary | ICD-10-CM

## 2015-05-21 DIAGNOSIS — T8612 Kidney transplant failure: Secondary | ICD-10-CM | POA: Diagnosis present

## 2015-05-21 DIAGNOSIS — R42 Dizziness and giddiness: Secondary | ICD-10-CM | POA: Diagnosis not present

## 2015-05-21 DIAGNOSIS — G4733 Obstructive sleep apnea (adult) (pediatric): Secondary | ICD-10-CM | POA: Diagnosis present

## 2015-05-21 DIAGNOSIS — Z951 Presence of aortocoronary bypass graft: Secondary | ICD-10-CM

## 2015-05-21 DIAGNOSIS — Z7982 Long term (current) use of aspirin: Secondary | ICD-10-CM

## 2015-05-21 HISTORY — DX: Essential (primary) hypertension: I10

## 2015-05-21 HISTORY — DX: Presence of aortocoronary bypass graft: Z95.1

## 2015-05-21 LAB — BASIC METABOLIC PANEL
Anion gap: 13 (ref 5–15)
BUN: 18 mg/dL (ref 6–20)
CALCIUM: 9.5 mg/dL (ref 8.9–10.3)
CO2: 28 mmol/L (ref 22–32)
Chloride: 95 mmol/L — ABNORMAL LOW (ref 101–111)
Creatinine, Ser: 4.86 mg/dL — ABNORMAL HIGH (ref 0.61–1.24)
GFR calc Af Amer: 14 mL/min — ABNORMAL LOW (ref >60)
GFR, EST NON AFRICAN AMERICAN: 12 mL/min — AB (ref >60)
GLUCOSE: 99 mg/dL (ref 65–99)
POTASSIUM: 4.3 mmol/L (ref 3.5–5.1)
Sodium: 136 mmol/L (ref 135–145)

## 2015-05-21 LAB — I-STAT TROPONIN, ED: Troponin i, poc: 0.03 ng/mL (ref 0.00–0.08)

## 2015-05-21 LAB — TROPONIN I: TROPONIN I: 0.58 ng/mL — AB (ref <0.031)

## 2015-05-21 LAB — CBC WITH DIFFERENTIAL/PLATELET
BASOS ABS: 0 10*3/uL (ref 0.0–0.1)
Basophils Relative: 0 %
EOS PCT: 3 %
Eosinophils Absolute: 0.2 10*3/uL (ref 0.0–0.7)
HCT: 41.8 % (ref 39.0–52.0)
Hemoglobin: 13.7 g/dL (ref 13.0–17.0)
Lymphocytes Relative: 15 %
Lymphs Abs: 0.9 10*3/uL (ref 0.7–4.0)
MCH: 32.3 pg (ref 26.0–34.0)
MCHC: 32.8 g/dL (ref 30.0–36.0)
MCV: 98.6 fL (ref 78.0–100.0)
MONO ABS: 0.7 10*3/uL (ref 0.1–1.0)
Monocytes Relative: 11 %
Neutro Abs: 4.5 10*3/uL (ref 1.7–7.7)
Neutrophils Relative %: 71 %
Platelets: 156 10*3/uL (ref 150–400)
RBC: 4.24 MIL/uL (ref 4.22–5.81)
RDW: 14.6 % (ref 11.5–15.5)
WBC: 6.3 10*3/uL (ref 4.0–10.5)

## 2015-05-21 LAB — PROTIME-INR
INR: 1.21 (ref 0.00–1.49)
PROTHROMBIN TIME: 15.4 s — AB (ref 11.6–15.2)

## 2015-05-21 MED ORDER — FAMOTIDINE 20 MG PO TABS
20.0000 mg | ORAL_TABLET | Freq: Every day | ORAL | Status: DC
  Administered 2015-05-22 – 2015-05-27 (×6): 20 mg via ORAL
  Filled 2015-05-21 (×6): qty 1

## 2015-05-21 MED ORDER — HEPARIN (PORCINE) IN NACL 100-0.45 UNIT/ML-% IJ SOLN
1500.0000 [IU]/h | INTRAMUSCULAR | Status: DC
  Administered 2015-05-21: 1150 [IU]/h via INTRAVENOUS
  Administered 2015-05-22: 1400 [IU]/h via INTRAVENOUS
  Filled 2015-05-21 (×4): qty 250

## 2015-05-21 MED ORDER — ACETAMINOPHEN 325 MG PO TABS
650.0000 mg | ORAL_TABLET | ORAL | Status: DC | PRN
  Administered 2015-05-21 – 2015-05-26 (×2): 650 mg via ORAL
  Filled 2015-05-21 (×2): qty 2

## 2015-05-21 MED ORDER — SODIUM CHLORIDE 0.9 % IV BOLUS (SEPSIS)
500.0000 mL | Freq: Once | INTRAVENOUS | Status: AC
  Administered 2015-05-21: 500 mL via INTRAVENOUS

## 2015-05-21 MED ORDER — CITALOPRAM HYDROBROMIDE 20 MG PO TABS
20.0000 mg | ORAL_TABLET | Freq: Every day | ORAL | Status: DC
  Administered 2015-05-21 – 2015-05-27 (×7): 20 mg via ORAL
  Filled 2015-05-21 (×7): qty 1

## 2015-05-21 MED ORDER — HEPARIN BOLUS VIA INFUSION
4000.0000 [IU] | Freq: Once | INTRAVENOUS | Status: AC
  Administered 2015-05-21: 4000 [IU] via INTRAVENOUS
  Filled 2015-05-21: qty 4000

## 2015-05-21 MED ORDER — ALBUTEROL SULFATE (2.5 MG/3ML) 0.083% IN NEBU: 2.5000 mg | INHALATION_SOLUTION | Freq: Four times a day (QID) | RESPIRATORY_TRACT | Status: DC | PRN

## 2015-05-21 MED ORDER — ATORVASTATIN CALCIUM 40 MG PO TABS
40.0000 mg | ORAL_TABLET | Freq: Every day | ORAL | Status: DC
  Administered 2015-05-21 – 2015-05-26 (×6): 40 mg via ORAL
  Filled 2015-05-21 (×6): qty 1

## 2015-05-21 MED ORDER — DOCUSATE SODIUM 100 MG PO CAPS
400.0000 mg | ORAL_CAPSULE | Freq: Two times a day (BID) | ORAL | Status: DC
  Administered 2015-05-21 – 2015-05-26 (×8): 400 mg via ORAL
  Filled 2015-05-21 (×8): qty 4

## 2015-05-21 MED ORDER — ACETAMINOPHEN 500 MG PO TABS
1000.0000 mg | ORAL_TABLET | Freq: Four times a day (QID) | ORAL | Status: DC | PRN
  Administered 2015-05-22 – 2015-05-24 (×4): 1000 mg via ORAL
  Filled 2015-05-21 (×4): qty 2

## 2015-05-21 MED ORDER — CLOPIDOGREL BISULFATE 75 MG PO TABS
75.0000 mg | ORAL_TABLET | Freq: Every day | ORAL | Status: DC
  Administered 2015-05-22 – 2015-05-27 (×6): 75 mg via ORAL
  Filled 2015-05-21 (×6): qty 1

## 2015-05-21 MED ORDER — PATIENT'S GUIDE TO USING COUMADIN BOOK
Freq: Once | Status: AC
  Administered 2015-05-22: 1
  Filled 2015-05-21 (×2): qty 1

## 2015-05-21 MED ORDER — WARFARIN - PHARMACIST DOSING INPATIENT
Freq: Every day | Status: DC
Start: 2015-05-21 — End: 2015-05-24
  Administered 2015-05-23: 18:00:00

## 2015-05-21 MED ORDER — ONDANSETRON HCL 4 MG/2ML IJ SOLN: 4.0000 mg | Freq: Four times a day (QID) | INTRAMUSCULAR | Status: DC | PRN

## 2015-05-21 MED ORDER — ALBUTEROL SULFATE HFA 108 (90 BASE) MCG/ACT IN AERS
1.0000 | INHALATION_SPRAY | Freq: Four times a day (QID) | RESPIRATORY_TRACT | Status: DC | PRN
Start: 2015-05-21 — End: 2015-05-21

## 2015-05-21 MED ORDER — RENA-VITE PO TABS
1.0000 | ORAL_TABLET | Freq: Every day | ORAL | Status: DC
  Administered 2015-05-21 – 2015-05-27 (×7): 1 via ORAL
  Filled 2015-05-21 (×7): qty 1

## 2015-05-21 MED ORDER — NITROGLYCERIN 0.4 MG SL SUBL: 0.4000 mg | SUBLINGUAL_TABLET | SUBLINGUAL | Status: DC | PRN

## 2015-05-21 MED ORDER — WARFARIN SODIUM 7.5 MG PO TABS
7.5000 mg | ORAL_TABLET | Freq: Once | ORAL | Status: AC
  Administered 2015-05-21: 7.5 mg via ORAL
  Filled 2015-05-21: qty 1

## 2015-05-21 MED ORDER — FOLIC ACID 1 MG PO TABS
1.0000 mg | ORAL_TABLET | Freq: Every day | ORAL | Status: DC
  Administered 2015-05-21 – 2015-05-27 (×7): 1 mg via ORAL
  Filled 2015-05-21 (×7): qty 1

## 2015-05-21 MED ORDER — SODIUM CHLORIDE 0.9 % IV BOLUS (SEPSIS): 250.0000 mL | Freq: Once | INTRAVENOUS | Status: DC

## 2015-05-21 MED ORDER — WARFARIN VIDEO
Freq: Once | Status: AC
  Administered 2015-05-22: 12:00:00

## 2015-05-21 MED ORDER — CALCIUM ACETATE (PHOS BINDER) 667 MG PO CAPS
4002.0000 mg | ORAL_CAPSULE | Freq: Three times a day (TID) | ORAL | Status: DC
  Administered 2015-05-21 – 2015-05-27 (×13): 4002 mg via ORAL
  Filled 2015-05-21 (×15): qty 6

## 2015-05-21 MED ORDER — GABAPENTIN 300 MG PO CAPS
300.0000 mg | ORAL_CAPSULE | Freq: Every day | ORAL | Status: DC
  Administered 2015-05-21 – 2015-05-25 (×5): 300 mg via ORAL
  Filled 2015-05-21 (×6): qty 1

## 2015-05-21 MED ORDER — SODIUM POLYSTYRENE SULFONATE 15 GM/60ML PO SUSP: 15.0000 g | ORAL | Status: DC

## 2015-05-21 NOTE — Progress Notes (Signed)
ANTICOAGULATION CONSULT NOTE - Initial Consult  Pharmacy Consult for Heparin and Coumadin Indication: chest pain/ACS and atrial fibrillation  Allergies  Allergen Reactions  . Diphenhydramine Itching    Only with IV doses. Tolerates oral.  . Penicillins Other (See Comments)    migraine  . Adhesive [Tape] Rash    Please use paper tape    Patient Measurements: Height: '5\' 10"'$  (177.8 cm) Weight: 181 lb 6.4 oz (82.283 kg) IBW/kg (Calculated) : 73 Heparin Dosing Weight: 82.3 kg  Vital Signs: Temp: 97.8 F (36.6 C) (09/14 1640) Temp Source: Oral (09/14 1214) BP: 109/69 mmHg (09/14 1640) Pulse Rate: 64 (09/14 1600)  Labs:  Recent Labs  05/21/15 1215  HGB 13.7  HCT 41.8  PLT 156  CREATININE 4.86*   ESRD  Medical History: Past Medical History  Diagnosis Date  . Multiple sclerosis   . CAD (coronary artery disease)   . Aortic heart valve prolapse   . Peripheral vascular disease   . Sleep apnea     ordered bipap with 3L bled in, but pt will not wear  . Atrial fibrillation   . Multiple sclerosis   . 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  He had a renal Tx from 2008 to 2011.  Gets HD in [place], [place] on MWF schedule.    Marland Kitchen Hypertension     Per Renal MD   Assessment:   59 yr old male to begin IV heparin and Coumadin for PAF, noted spontaneously converted.  Patient reports taking Coumadin for a short time years ago, but cannot recall reason.  Baseline PT/INR in process. Platelet count 156.  Takes Plavix daily, no aspirin.  Goal of Therapy:  INR 2-3 Heparin level 0.3-0.7 units/ml Monitor platelets by anticoagulation protocol: Yes   Plan:   Heparin 4000 units IV x 1.  Heparin drip to begin at 1150 units/hr.  Heparin level ~8 hrs after drip begins.  Daily heparin level, CBC and PT/INR.  Coumadin 7.5 mg x 1 tonight.  Coumadin book/video; education  prior to discharge.    Arty Baumgartner, [place] Pager: [phone] 05/21/2015,5:40 PM

## 2015-05-21 NOTE — Progress Notes (Signed)
Patient has overdue 250 mL Normal Saline bolus on MAR. Paged MD to let him know patient did not receive bolus. Will continue to monitor.

## 2015-05-21 NOTE — ED Notes (Signed)
Per EMS- pt was at dialysis where he completed his whole treatment. Pt has hx of afib. Pt started a half dose of Metoprolol today for the first time. Pt took '25mg'$  today instead of his usual 50 per MD Macklehany. Pt running in afib from 100s-150s. PIV 18G placed to RAC. 324 ASA given PTA. BP 90s. Pt reports that before dialysis was over he began to have jaw pressure with nausea and dizziness. Denies dizziness and nausea at present but still has jaw pressure.

## 2015-05-21 NOTE — Consult Note (Signed)
59 year old male with history of ESRD on HD at [facility] MWF, s/p failed renal transplant, CAD s/p CABG & aortic valve replacement with bioprosthetic valve, paroxysmal atrial fibrillation, multiple sclerosis and obstructive sleep apnea.  He was admitted after onset of  aflutter with RVR during HD and dialysis associated hypotension.  He recently has been taking a lower dose of metoprolol.  He also has been taking the beta blocker predialysis.  He has been noting orthostatic dizziness on and off dialysis.  He feels he has gained weight. EDW is 80kg.  He left at 80.4kg today.  Past Medical History  Diagnosis Date  . Multiple sclerosis   . CAD (coronary artery disease)   . Aortic heart valve prolapse   . Peripheral vascular disease   . Sleep apnea     ordered bipap with 3L bled in, but pt will not wear  . Atrial fibrillation   . Multiple sclerosis   . 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  He had a renal Tx from 2008 to 2011.  Gets HD in [place], [place] on MWF schedule.    Marland Kitchen Hypertension     Per Renal MD   Past Surgical History  Procedure Laterality Date  . Av fistula placement    . Flash      flash pulmonary edema  . Kidney transplant  08/2007  . Hernia repair  07/2011  . Excised squamous cells at rectum  2006  . Left heart catheterization with coronary angiogram N/A 01/09/2013    Procedure: LEFT HEART CATHETERIZATION WITH CORONARY ANGIOGRAM;  Surgeon: Cameron Blanks, MD;  Location: [facility] CATH LAB;  Service: Cardiovascular;  Laterality: N/A;   Social History:  reports that he quit smoking about 16 years ago. His smoking use included Cigarettes. He has a 40 pack-year smoking history. He does not have any smokeless tobacco history on file. He reports that he does not drink alcohol or use illicit drugs. Allergies:  Allergies  Allergen Reactions  . Diphenhydramine Itching   Only with IV doses. Tolerates oral.  . Penicillins Other (See Comments)    migraine  . Adhesive [Tape] Rash    Please use paper tape   Family History  Problem Relation Age of Onset  . Diabetes Father   . Heart failure Mother     started in 37s  . Lupus Sister     Medications:  Scheduled: . atorvastatin  40 mg Oral QHS  . calcium acetate  4,002 mg Oral TID WC  . citalopram  20 mg Oral Daily  . [START ON 05/22/2015] clopidogrel  75 mg Oral Q breakfast  . docusate sodium  400 mg Oral BID  . [START ON 05/22/2015] famotidine  20 mg Oral Daily  . folic acid  1 mg Oral Daily  . gabapentin  300 mg Oral QHS  . multivitamin  1 tablet Oral Daily  . sodium chloride  250 mL Intravenous Once   ROS: as per HPI  Blood pressure 109/69, pulse 64, temperature 97.8 F (36.6 C), temperature source Oral, resp. rate 20, height _0  (1.778 m), weight 82.283 kg (181 lb 6.4 oz), SpO2 100 %.  General appearance: alert and cooperative Head: Normocephalic, without obvious abnormality, atraumatic Eyes: conjunctivae/corneas clear. PERrl Nose: Nares normal. Septum midline. Mucosa normal. No drainage or sinus tenderness. Resp: clear to auscultation bilaterally Chest wall: no  tenderness Cardio: regular rate and rhythm, S1, S2 normal, no murmur, click, rub or gallop GI: soft, non-tender; bowel sounds normal; no masses,  no organomegaly Extremities: extremities normal, atraumatic, no cyanosis or edema Skin: Skin color, texture, turgor normal. No rashes or lesions Neurologic: Grossly normal   Results for orders placed or performed during the hospital encounter of 05/21/15 (from the past 48 hour(s))  CBC with Differential     Status: None   Collection Time: 05/21/15 12:15 PM  Result Value Ref Range   WBC 6.3 4.0 - 10.5 K/uL   RBC 4.24 4.22 - 5.81 MIL/uL   Hemoglobin 13.7 13.0 - 17.0 g/dL   HCT 41.8 39.0 - 52.0 %   MCV 98.6 78.0 - 100.0 fL   MCH 32.3 26.0 - 34.0 pg   MCHC 32.8 30.0 - 36.0 g/dL   RDW  14.6 11.5 - 15.5 %   Platelets 156 150 - 400 K/uL   Neutrophils Relative % 71 %   Neutro Abs 4.5 1.7 - 7.7 K/uL   Lymphocytes Relative 15 %   Lymphs Abs 0.9 0.7 - 4.0 K/uL   Monocytes Relative 11 %   Monocytes Absolute 0.7 0.1 - 1.0 K/uL   Eosinophils Relative 3 %   Eosinophils Absolute 0.2 0.0 - 0.7 K/uL   Basophils Relative 0 %   Basophils Absolute 0.0 0.0 - 0.1 K/uL  Basic metabolic panel     Status: Abnormal   Collection Time: 05/21/15 12:15 PM  Result Value Ref Range   Sodium 136 135 - 145 mmol/L   Potassium 4.3 3.5 - 5.1 mmol/L   Chloride 95 (L) 101 - 111 mmol/L   CO2 28 22 - 32 mmol/L   Glucose, Bld 99 65 - 99 mg/dL   BUN 18 6 - 20 mg/dL   Creatinine, Ser 4.86 (H) 0.61 - 1.24 mg/dL   Calcium 9.5 8.9 - 10.3 mg/dL   GFR calc non Af Amer 12 (L) >60 mL/min   GFR calc Af Amer 14 (L) >60 mL/min    Comment: (NOTE) The eGFR has been calculated using the CKD EPI equation. This calculation has not been validated in all clinical situations. eGFR's persistently <60 mL/min signify possible Chronic Kidney Disease.    Anion gap 13 5 - 15  I-Stat Troponin, ED (not at [facility])     Status: None   Collection Time: 05/21/15 12:32 PM  Result Value Ref Range   Troponin i, poc 0.03 0.00 - 0.08 ng/mL   Comment 3            Comment: Due to the release kinetics of cTnI, a negative result within the first hours of the onset of symptoms does not rule out myocardial infarction with certainty. If myocardial infarction is still suspected, repeat the test at appropriate intervals.    No results found.  Assessment:  1 A flutter, Afib, now NSR 2 ESRD 3 Dialysis associated hypotension Plan: 1 Will increase EDW 1 kg to 81kg 2 Agree with reducing or stopping lisinopril 3 Would increase metoprolol(LA) dosage, and give several hours after dialysis to avoid hypotension during dialysis; or use short acting dosages except before dialysis.  [name] C 05/21/2015, 5:08 PM

## 2015-05-21 NOTE — Progress Notes (Signed)
Checked with [facility]. Patient's HR was regular and normal on arrival, only near the end of dialysis around 10:45AM when he went into fast rhythm. Also he has documented HTN  Signed, Almyra Deforest PA Pager: [phone]

## 2015-05-21 NOTE — ED Provider Notes (Signed)
CSN: [id number]     Arrival date & time 05/21/15  1208 History   First MD Initiated Contact with Patient 05/21/15 1224     Chief Complaint  Patient presents with  . Atrial Fibrillation     (Consider location/radiation/quality/duration/timing/severity/associated sxs/prior Treatment) HPI Patient complains of vague pressure sensation at submandibular area onset 9:30 AM today. He was noted and hemodialysis to be in atrial fib with RVR. Other associated symptoms include mild lightheadedness. No treatment prior to coming here. He feels improved since being here, without treatment. Denies chest pain denies shortness of breath. He reports he had received full dialysis treatment today. No other associated symptoms .denies chest pain denies shortness of breath  Past Medical History  Diagnosis Date  . Multiple sclerosis   . CAD (coronary artery disease)   . Aortic heart valve prolapse   . Peripheral vascular disease   . Sleep apnea     ordered bipap with 3L bled in, but pt will not wear  . Atrial fibrillation   . Multiple sclerosis   . 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  He had a renal Tx from 2008 to 2011.  Gets HD in [place], [place] on MWF schedule.     Past Surgical History  Procedure Laterality Date  . Av fistula placement    . Flash      flash pulmonary edema  . Kidney transplant  08/2007  . Hernia repair  07/2011  . Excised squamous cells at rectum  2006  . Left heart catheterization with coronary angiogram N/A 01/09/2013    Procedure: LEFT HEART CATHETERIZATION WITH CORONARY ANGIOGRAM;  Surgeon: Burnell Blanks, MD;  Location: [facility] CATH LAB;  Service: Cardiovascular;  Laterality: N/A;   History reviewed. No pertinent family history. Social History  Substance Use Topics  . Smoking status: Former Smoker -- 2.00 packs/day for 20 years    Types: Cigarettes     Quit date: 08/06/1998  . Smokeless tobacco: None  . Alcohol Use: No    Review of Systems  Neurological: Positive for light-headedness.  All other systems reviewed and are negative.     Allergies  Diphenhydramine; Penicillins; and Adhesive  Home Medications   Prior to Admission medications   Medication Sig Start Date End Date Taking? Authorizing Provider  acetaminophen (TYLENOL) 500 MG tablet Take 1,000 mg by mouth every 6 (six) hours as needed for pain or fever.    Historical Provider, MD  albuterol (PROAIR HFA) 108 (90 BASE) MCG/ACT inhaler Inhale 1-2 puffs into the lungs every 6 (six) hours as needed. 07/19/11   Historical Provider, MD  aspirin EC 81 MG tablet Take 81 mg by mouth daily.     Historical Provider, MD  atorvastatin (LIPITOR) 40 MG tablet Take 1 tablet (40 mg total) by mouth at bedtime. 04/08/15   Brett Canales, PA-C  B Complex-C-Folic Acid (RENA-VITE RX) 1 MG TABS Take 1 tablet by mouth. 01/25/15   Historical Provider, MD  calcium acetate (PHOSLO) 667 MG capsule Take 4,002 mg by mouth 3 (three) times daily with meals.     Historical Provider, MD  citalopram (CELEXA) 20 MG tablet Take 20 mg by mouth daily.    Historical Provider, MD  clopidogrel (PLAVIX) 75 MG tablet Take 1 tablet (75 mg total) by mouth daily with breakfast. 04/08/15   Brett Canales, PA-C  docusate sodium (COLACE) 100 MG capsule Take 400 mg by mouth 2 (two) times daily.    Historical Provider, MD  Fluticasone Furoate-Vilanterol (BREO ELLIPTA) 200-25 MCG/INH AEPB Inhale 1 puff into the lungs daily.    Historical Provider, MD  folic acid (FOLVITE) 1 MG tablet Take 1 mg by mouth daily.    Historical Provider, MD  gabapentin (NEURONTIN) 300 MG capsule Take 300 mg by mouth at bedtime.     Historical Provider, MD  lisinopril (PRINIVIL,ZESTRIL) 20 MG tablet Take 1 tablet (20 mg total) by mouth daily. 04/08/15   Brett Canales, PA-C  metoprolol succinate (TOPROL-XL) 25 MG 24 hr tablet Take 1 tablet (25 mg total) by  mouth 2 (two) times daily. 05/19/15   Burnell Blanks, MD  multivitamin (RENA-VIT) TABS tablet Take 1 tablet by mouth daily. 01/11/13   Samella Parr, NP  nitroGLYCERIN (NITROSTAT) 0.4 MG SL tablet Place 1 tablet (0.4 mg total) under the tongue every 5 (five) minutes as needed for chest pain (Take one tabe every 5-10 minutes for chest pain. Once you have taken three tabs you need to notify your MD or go to ER if chest pain not gone.). 01/15/13   Burnell Blanks, MD  ranitidine (ZANTAC) 150 MG tablet Take 150 mg by mouth daily.    Historical Provider, MD  sodium polystyrene (KAYEXALATE) 15 GM/60ML suspension Take 15 g by mouth See admin instructions. Only take 15 g on Sun / Tues / Thurs / Sat (non dialysis days)    Historical Provider, MD   BP 100/69 mmHg  Pulse 122  Temp(Src) 97.8 F (36.6 C) (Oral)  Resp 19  Ht '5\' 10"'$  (1.778 m)  Wt 183 lb (83.008 kg)  BMI 26.26 kg/m2  SpO2 95% Physical Exam  Constitutional: He appears well-developed and well-nourished.  HENT:  Head: Normocephalic and atraumatic.  Eyes: Conjunctivae are normal. Pupils are equal, round, and reactive to light.  Neck: Neck supple. No tracheal deviation present. No thyromegaly present.  Cardiovascular:  No murmur heard. Tachycardic irregularly irregular  Pulmonary/Chest: Effort normal and breath sounds normal.  Abdominal: Soft. Bowel sounds are normal. He exhibits no distension. There is no tenderness.  Musculoskeletal: Normal range of motion. He exhibits no edema or tenderness.  Dialysis fistula at left upper extremity with good thrill  Neurological: He is alert. Coordination normal.  Skin: Skin is warm and dry. No rash noted.  Psychiatric: He has a normal mood and affect.  Nursing note and vitals reviewed.   ED Course  Procedures (including critical care time) Labs Review Labs Reviewed  CBC WITH DIFFERENTIAL/PLATELET  BASIC METABOLIC PANEL  I-STAT [place], ED    Imaging Review No results  found. I have personally reviewed and evaluated these images and lab results as part of my medical decision-making.   EKG Interpretation   Date/Time:  Wednesday May 21 2015 12:10:35 EDT Ventricular Rate:  130 PR Interval:    QRS Duration: 111 QT Interval:  365 QTC Calculation: 537 R Axis:   68 Text Interpretation:  Atrial flutter with varied AV block, Anteroseptal  infarct, old Repol abnrm suggests ischemia, diffuse leads Prolonged QT  interval Confirmed by Winfred Leeds  MD, Jakeel Starliper [phone]) on 05/21/2015 12:25:30 PM     1300 p.m. patient asymptomatic and in normal sinus rhythm after treatment with 500 mL intravenous normal saline bolus Results for orders placed or performed during the hospital encounter of 05/21/15  CBC with Differential  Result Value Ref Range   WBC 6.3  4.0 - 10.5 K/uL   RBC 4.24 4.22 - 5.81 MIL/uL   Hemoglobin 13.7 13.0 - 17.0 g/dL   HCT 41.8 39.0 - 52.0 %   MCV 98.6 78.0 - 100.0 fL   MCH 32.3 26.0 - 34.0 pg   MCHC 32.8 30.0 - 36.0 g/dL   RDW 14.6 11.5 - 15.5 %   Platelets 156 150 - 400 K/uL   Neutrophils Relative % 71 %   Neutro Abs 4.5 1.7 - 7.7 K/uL   Lymphocytes Relative 15 %   Lymphs Abs 0.9 0.7 - 4.0 K/uL   Monocytes Relative 11 %   Monocytes Absolute 0.7 0.1 - 1.0 K/uL   Eosinophils Relative 3 %   Eosinophils Absolute 0.2 0.0 - 0.7 K/uL   Basophils Relative 0 %   Basophils Absolute 0.0 0.0 - 0.1 K/uL  Basic metabolic panel  Result Value Ref Range   Sodium 136 135 - 145 mmol/L   Potassium 4.3 3.5 - 5.1 mmol/L   Chloride 95 (L) 101 - 111 mmol/L   CO2 28 22 - 32 mmol/L   Glucose, Bld 99 65 - 99 mg/dL   BUN 18 6 - 20 mg/dL   Creatinine, Ser 4.86 (H) 0.61 - 1.24 mg/dL   Calcium 9.5 8.9 - 10.3 mg/dL   GFR calc non Af Amer 12 (L) >60 mL/min   GFR calc Af Amer 14 (L) >60 mL/min   Anion gap 13 5 - 15  I-Stat Troponin, ED (not at [facility])  Result Value Ref Range   Troponin i, poc 0.03 0.00 - 0.08 ng/mL   Comment 3           No results  found.  MDM  I've asked cardiology service to evaluate patient in ED. Patient is intermittently in atrial fibrillation. He may need further medication adjustment Final diagnoses:  None   Dx atrial fibrillation with rapid ventricular response     Orlie Dakin, MD 05/21/15 1350

## 2015-05-21 NOTE — H&P (Signed)
CARDIOLOGY CONSULT NOTE   Patient ID: [name] MRN: [id number], DOB/AGE: 11-08-1955, 59   Admit date: 05/21/2015 Date of Consult: 05/21/2015   Primary Physician: Teressa Lower, MD Primary Cardiologist: Dr. Angelena Form, [place]([facility])    Pt. Profile  59 year old Caucasian male with past medical history of CAD s/p CABG in Aug 2014 with SVG to LAD, aortic valve replacement with Edwards 23 mm bioprosthetic valve during CABG in August 2014, paroxysmal atrial fibrillation, ESRD with renal transplant with subsequent rejection on HD MWF, multiple sclerosis and obstructive sleep apnea not compliant on BiPAP presented with aflutter with RVR during HD  Problem List  Past Medical History  Diagnosis Date  . Multiple sclerosis   . CAD (coronary artery disease)   . Aortic heart valve prolapse   . Peripheral vascular disease   . Sleep apnea     ordered bipap with 3L bled in, but pt will not wear  . Atrial fibrillation   . Multiple sclerosis   . 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  He had a renal Tx from 2008 to 2011.  Gets HD in [place], [place] on MWF schedule.      Past Surgical History  Procedure Laterality Date  . Av fistula placement    . Flash      flash pulmonary edema  . Kidney transplant  08/2007  . Hernia repair  07/2011  . Excised squamous cells at rectum  2006  . Left heart catheterization with coronary angiogram N/A 01/09/2013    Procedure: LEFT HEART CATHETERIZATION WITH CORONARY ANGIOGRAM;  Surgeon: Burnell Blanks, MD;  Location: [facility] CATH LAB;  Service: Cardiovascular;  Laterality: N/A;     Allergies  Allergies  Allergen Reactions  . Diphenhydramine Itching    Only with IV doses. Tolerates oral.  . Penicillins Other (See Comments)    migraine  . Adhesive [Tape] Rash    Please use paper tape    HPI   The patient is a 59 year old  Caucasian male with past medical history of CAD s/p CABG in Aug 2014 with SVG to LAD, aortic valve replacement with Edwards 23 mm bioprosthetic valve during CABG in August 2014, paroxysmal atrial fibrillation, ESRD with renal transplant with subsequent rejection on HD MWF, multiple sclerosis and obstructive sleep apnea not compliant on BiPAP. 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 Unfortunately, his transplanted kidney subsequently failed and he was restarted on dialysis. His last cardiac catheterization was in 01/2015 and underwent PCI of left circumflex (resolute drug-eluting stent) and SVG to LAD (Herculink bare metal stent) on 5/5. He was reestablished without office on 04/08/2015, during the office visit, it was noted he was noncompliant with BiPAP despite his obstructive sleep apnea, he also had several episode of recurrent atrial fibrillation which typically occur during dialysis sessions and only last a short period of time. It was felt that if further recurrence of atrial fibrillation recur, he may need to come off of aspirin and started on Coumadin and Plavix. Per patient, he wished to hold off on starting Coumadin until talk with Dr. Angelena Form. Echocardiogram was repeated on 04/14/2015 which showed EF 63-89%, grade 1 diastolic dysfunction, mild to moderate aortic regurg, mild MS/MR.  According to the patient, he has been  having some low blood pressure for the past several weeks which persists after dialysis. He was previously on 50 mg twice a day of Toprol XL and was doing quite well on it for a long time. His nephrologist recommended him to cut back to 50 mg once a day Toprol-XL. After checking with Dr. Camillia Herter office, he said he was told to change it to 25 mg twice a day to avoid significant hypotension. Otherwise he has been in  his usual state of health. He goes golfing once a week and has cardiac rehabilitation 3 times a week, he denies any significant chest discomfort that's reminiscent of his previous angina.   This morning he was doing his dialysis, roughly 15-20 minutes prior to finishing up a full course, he had some jaw discomfort and went into atrial fibrillation with RVR. He denies any chest discomfort or shortness of breath. He was subsequently sent to [facility] for further evaluation, while in the ED, he spontaneously converted to normal sinus rhythm at around 12:50 PM. On arrival to [facility], significant laboratory finding include creatinine of 4.86, negative troponin, normal CBC.   No current facility-administered medications on file prior to encounter.   Current Outpatient Prescriptions on File Prior to Encounter  Medication Sig Dispense Refill  . acetaminophen (TYLENOL) 500 MG tablet Take 1,000 mg by mouth every 6 (six) hours as needed for pain or fever.    Marland Kitchen albuterol (PROAIR HFA) 108 (90 BASE) MCG/ACT inhaler Inhale 1-2 puffs into the lungs every 6 (six) hours as needed.    Marland Kitchen aspirin EC 81 MG tablet Take 81 mg by mouth daily.     Marland Kitchen atorvastatin (LIPITOR) 40 MG tablet Take 1 tablet (40 mg total) by mouth at bedtime. 90 tablet 3  . B Complex-C-Folic Acid (RENA-VITE RX) 1 MG TABS Take 1 tablet by mouth.  3  . calcium acetate (PHOSLO) 667 MG capsule Take 4,002 mg by mouth 3 (three) times daily with meals.     . citalopram (CELEXA) 20 MG tablet Take 20 mg by mouth daily.    . clopidogrel (PLAVIX) 75 MG tablet Take 1 tablet (75 mg total) by mouth daily with breakfast. 90 tablet 3  . docusate sodium (COLACE) 100 MG capsule Take 400 mg by mouth 2 (two) times daily.    . Fluticasone Furoate-Vilanterol (BREO ELLIPTA) 200-25 MCG/INH AEPB Inhale 1 puff into the lungs daily.    . folic acid (FOLVITE) 1 MG tablet Take 1 mg by mouth daily.    Marland Kitchen gabapentin (NEURONTIN) 300 MG capsule Take 300 mg by  mouth at bedtime.     Marland Kitchen lisinopril (PRINIVIL,ZESTRIL) 20 MG tablet Take 1 tablet (20 mg total) by mouth daily. 90 tablet 3  . metoprolol succinate (TOPROL-XL) 25 MG 24 hr tablet Take 1 tablet (25 mg total) by mouth 2 (two) times daily. 180 tablet 0  . multivitamin (RENA-VIT) TABS tablet Take 1 tablet by mouth daily. 30 tablet 1  . nitroGLYCERIN (NITROSTAT) 0.4 MG SL tablet Place 1 tablet (0.4 mg total) under the tongue every 5 (five) minutes as needed for chest pain (Take one tabe every 5-10 minutes for chest pain. Once you have taken three tabs you need to notify your MD or go to ER if chest pain not gone.). 30 tablet 0  . ranitidine (ZANTAC) 150 MG tablet Take 150 mg by mouth daily.    . sodium polystyrene (KAYEXALATE) 15 GM/60ML suspension Take 15 g by mouth See admin instructions.  Only take 15 g on Sun / Tues / Thurs / Sat (non dialysis days)        Family History Family History  Problem Relation Age of Onset  . Diabetes Father   . Heart failure Mother     started in 46s  . Lupus Sister      Social History Social History   Social History  . Marital Status: Married    Spouse Name: N/A  . Number of Children: N/A  . Years of Education: N/A   Occupational History  . Disabled    Social History Main Topics  . Smoking status: Former Smoker -- 2.00 packs/day for 20 years    Types: Cigarettes    Quit date: 08/06/1998  . Smokeless tobacco: Not on file  . Alcohol Use: No  . Drug Use: No  . Sexual Activity: Not on file   Other Topics Concern  . Not on file   Social History Narrative   No history of premature CAD in either parents or siblings. However, his maternal grandfather and 2 maternal uncles had coronary artery disease.     Review of Systems  General:  No chills, fever, night sweats or weight changes.  Cardiovascular:  No chest pain, dyspnea on exertion, edema, orthopnea, paroxysmal nocturnal dyspnea. +Jaw pain Dermatological: No rash, lesions/masses Respiratory: No  cough, dyspnea Urologic: No hematuria, dysuria Abdominal:   No nausea, vomiting, diarrhea, bright red blood per rectum, melena, or hematemesis Neurologic:  No visual changes, wkns, changes in mental status. All other systems reviewed and are otherwise negative except as noted above.  Physical Exam  Blood pressure 95/65, pulse 73, temperature 97.8 F (36.6 C), temperature source Oral, resp. rate 16, height '5\' 10"'$  (1.778 m), weight 183 lb (83.008 kg), SpO2 94 %.  General: Pleasant, NAD Psych: Normal affect. Neuro: Alert and oriented X 3. Moves all extremities spontaneously. HEENT: Normal  Neck: Supple without bruits or JVD. Lungs:  Resp regular and unlabored, CTA. Heart: RRR no s3, s4, or murmurs. Left forearm AV fistula noted. Abdomen: Soft, non-tender, non-distended, BS + x 4.  Extremities: No clubbing, cyanosis or edema. DP/PT/Radials 2+ and equal bilaterally. Brown discoloration of lower extremity skin.  Labs  No results for input(s): CKTOTAL, CKMB, TROPONINI in the last 72 hours. Lab Results  Component Value Date   WBC 6.3 05/21/2015   HGB 13.7 05/21/2015   HCT 41.8 05/21/2015   MCV 98.6 05/21/2015   PLT 156 05/21/2015     Recent Labs Lab 05/21/15 1215  NA 136  K 4.3  CL 95*  CO2 28  BUN 18  CREATININE 4.86*  CALCIUM 9.5  GLUCOSE 99   Radiology/Studies  No results found.  ECG  atrial flutter with variable conduction and RVR, diffuse ST depression.  ASSESSMENT AND PLAN  1. Paroxysmal atrial flutter/Fibrillation spontaneously converted in the ED at 12:50 PM  - CHA2DS2-VASC score 1-2 (CAD +/- HTN), occurs in the setting of dialysis  - Toprol XL '50mg'$  BID recently cut back to '25mg'$  BID due to hypotension  - Antiarrhythmic therapy limited by structural heart disease and end-stage renal disease.  - SBP now in 90-100s.   Addendum: discussed with Dr. Radford Pax, will call his dialysis center and figure out onset of a-fib this morning and whether or not he truly carry a  diagnosis of HTN, if CHA2DS2-VASC score 2, will consider coumadin to replace ASA. Also consult nephrology and potentially increase his dry target weight as over aggressive dialysis is causing hypotension.  Will D/C his lisinoril since his EF is normal and has ESRD. Will hold Toprol XL for now, hopefully add back Toprol XL '50mg'$  daily since majority of his afib/aflutter occur in AM during HD. Wife did not want any antiarrhythmic medication  2. Jaw pain but no CP  - Diffuse ST depression noted during tachycardia. ST depression resolved after convert to NSR  3. CAD s/p CABG in Aug 2014 with SVG to LAD  4. aortic valve replacement with Edwards 23 mm bioprosthetic valve during CABG in August 2014  5. ESRD with renal transplant with subsequent rejection on HD MWF 6. multiple sclerosis  7. obstructive sleep apnea not compliant on BiPAP   Signed, Almyra Deforest, PA-C 05/21/2015, 2:15 PM

## 2015-05-22 DIAGNOSIS — I25700 Atherosclerosis of coronary artery bypass graft(s), unspecified, with unstable angina pectoris: Secondary | ICD-10-CM | POA: Diagnosis not present

## 2015-05-22 DIAGNOSIS — N186 End stage renal disease: Secondary | ICD-10-CM

## 2015-05-22 DIAGNOSIS — T8612 Kidney transplant failure: Secondary | ICD-10-CM | POA: Diagnosis not present

## 2015-05-22 DIAGNOSIS — Z992 Dependence on renal dialysis: Secondary | ICD-10-CM | POA: Diagnosis not present

## 2015-05-22 DIAGNOSIS — I10 Essential (primary) hypertension: Secondary | ICD-10-CM | POA: Diagnosis not present

## 2015-05-22 DIAGNOSIS — N2581 Secondary hyperparathyroidism of renal origin: Secondary | ICD-10-CM | POA: Diagnosis not present

## 2015-05-22 DIAGNOSIS — I4892 Unspecified atrial flutter: Secondary | ICD-10-CM | POA: Diagnosis not present

## 2015-05-22 DIAGNOSIS — I480 Paroxysmal atrial fibrillation: Secondary | ICD-10-CM | POA: Diagnosis not present

## 2015-05-22 DIAGNOSIS — I4891 Unspecified atrial fibrillation: Secondary | ICD-10-CM | POA: Diagnosis not present

## 2015-05-22 DIAGNOSIS — I951 Orthostatic hypotension: Secondary | ICD-10-CM | POA: Diagnosis not present

## 2015-05-22 LAB — BASIC METABOLIC PANEL
ANION GAP: 13 (ref 5–15)
BUN: 35 mg/dL — ABNORMAL HIGH (ref 6–20)
CALCIUM: 9.3 mg/dL (ref 8.9–10.3)
CO2: 26 mmol/L (ref 22–32)
Chloride: 99 mmol/L — ABNORMAL LOW (ref 101–111)
Creatinine, Ser: 6.82 mg/dL — ABNORMAL HIGH (ref 0.61–1.24)
GFR, EST AFRICAN AMERICAN: 9 mL/min — AB (ref >60)
GFR, EST NON AFRICAN AMERICAN: 8 mL/min — AB (ref >60)
GLUCOSE: 91 mg/dL (ref 65–99)
POTASSIUM: 4.7 mmol/L (ref 3.5–5.1)
Sodium: 138 mmol/L (ref 135–145)

## 2015-05-22 LAB — CBC
HEMATOCRIT: 38.1 % — AB (ref 39.0–52.0)
HEMOGLOBIN: 12.6 g/dL — AB (ref 13.0–17.0)
MCH: 32.8 pg (ref 26.0–34.0)
MCHC: 33.1 g/dL (ref 30.0–36.0)
MCV: 99.2 fL (ref 78.0–100.0)
Platelets: 157 10*3/uL (ref 150–400)
RBC: 3.84 MIL/uL — ABNORMAL LOW (ref 4.22–5.81)
RDW: 14.7 % (ref 11.5–15.5)
WBC: 7 10*3/uL (ref 4.0–10.5)

## 2015-05-22 LAB — PROTIME-INR
INR: 1.26 (ref 0.00–1.49)
Prothrombin Time: 15.9 seconds — ABNORMAL HIGH (ref 11.6–15.2)

## 2015-05-22 LAB — TROPONIN I
Troponin I: 1.37 ng/mL (ref <0.031)
Troponin I: 1.54 ng/mL (ref <0.031)
Troponin I: 1.24 ng/mL (ref <0.031)
Troponin I: 1.13 ng/mL (ref <0.031)

## 2015-05-22 LAB — HEPARIN LEVEL (UNFRACTIONATED)
HEPARIN UNFRACTIONATED: 0.39 [IU]/mL (ref 0.30–0.70)
Heparin Unfractionated: 0.2 IU/mL — ABNORMAL LOW (ref 0.30–0.70)

## 2015-05-22 MED ORDER — METOPROLOL TARTRATE 25 MG PO TABS
25.0000 mg | ORAL_TABLET | Freq: Two times a day (BID) | ORAL | Status: DC
  Administered 2015-05-22 – 2015-05-27 (×9): 25 mg via ORAL
  Filled 2015-05-22 (×10): qty 1

## 2015-05-22 MED ORDER — FLUTICASONE PROPIONATE 50 MCG/ACT NA SUSP
1.0000 | Freq: Four times a day (QID) | NASAL | Status: DC | PRN
  Administered 2015-05-22: 1 via NASAL
  Filled 2015-05-22: qty 16

## 2015-05-22 MED ORDER — WARFARIN SODIUM 7.5 MG PO TABS
7.5000 mg | ORAL_TABLET | Freq: Once | ORAL | Status: AC
  Administered 2015-05-22: 7.5 mg via ORAL
  Filled 2015-05-22: qty 1

## 2015-05-22 MED ORDER — HEPARIN BOLUS VIA INFUSION
2000.0000 [IU] | Freq: Once | INTRAVENOUS | Status: AC
  Administered 2015-05-22: 2000 [IU] via INTRAVENOUS
  Filled 2015-05-22: qty 2000

## 2015-05-22 NOTE — Progress Notes (Signed)
[facility] for Heparin and Coumadin Indication: chest pain/ACS and atrial fibrillation  Allergies  Allergen Reactions  . Diphenhydramine Itching    Only with IV doses. Tolerates oral.  . Penicillins Other (See Comments)    migraine  . Adhesive [Tape] Rash    Please use paper tape    Patient Measurements: Height: '5\' 10"'$  (177.8 cm) Weight: 180 lb 3.3 oz (81.74 kg) IBW/kg (Calculated) : 73 Heparin Dosing Weight: 82.3 kg  Vital Signs: Temp: 97.8 F (36.6 C) (09/15 0421) Temp Source: Oral (09/15 0421) BP: 108/76 mmHg (09/15 0421)  Labs:  Recent Labs  05/21/15 1215 05/21/15 1718 05/21/15 2222 05/22/15 0140 05/22/15 0510 05/22/15 0957  HGB 13.7  --   --   --  12.6*  --   HCT 41.8  --   --   --  38.1*  --   PLT 156  --   --   --  157  --   LABPROT  --  15.4*  --   --  15.9*  --   INR  --  1.21  --   --  1.26  --   HEPARINUNFRC  --   --   --  0.20*  --  0.39  CREATININE 4.86*  --   --   --  6.82*  --   TROPONINI  --  0.58* 1.37*  --  1.54*  --    ESRD  Assessment:   59 yr old male to begin IV heparin and Coumadin for PAF, noted spontaneously converted.  Patient reports taking Coumadin for a short time years ago, but cannot recall reason.  Baseline PT/INR in process. Platelet count 156.  Takes Plavix daily, no aspirin.  Heparin level now therapeutic, CBC ok, INR slowly trending up.  Goal of Therapy:  INR 2-3 Heparin level 0.3-0.7 units/ml Monitor platelets by anticoagulation protocol: Yes   Plan:  Heparin to 1500 units / hr to prevent drop to less than 0.3 Coumadin 7.5 mg po x 1 today Daily HL/CBC/INR  Thank you Anette Guarneri, PharmD [phone]  05/22/2015,11:12 AM

## 2015-05-22 NOTE — Progress Notes (Signed)
Patient Name: [name] Date of Encounter: 05/22/2015  Primary Cardiologist: Dr. Angelena Form, [place]([facility])    Active Problems:   Atrial flutter with rapid ventricular response   Hypertension    SUBJECTIVE  Denies any CP or jaw pain. Feeling great overnight.   CURRENT MEDS . atorvastatin  40 mg Oral QHS  . calcium acetate  4,002 mg Oral TID WC  . citalopram  20 mg Oral Daily  . clopidogrel  75 mg Oral Q breakfast  . docusate sodium  400 mg Oral BID  . famotidine  20 mg Oral Daily  . folic acid  1 mg Oral Daily  . gabapentin  300 mg Oral QHS  . multivitamin  1 tablet Oral Daily  . patient's guide to using coumadin book   Does not apply Once  . sodium chloride  250 mL Intravenous Once  . warfarin  7.5 mg Oral ONCE-1800  . warfarin   Does not apply Once  . Warfarin - Pharmacist Dosing Inpatient   Does not apply q1800    OBJECTIVE  Filed Vitals:   05/21/15 1600 05/21/15 1640 05/21/15 2059 05/22/15 0421  BP: 114/69 109/69 100/67 108/76  Pulse: 64  75   Temp:  97.8 F (36.6 C) 98.1 F (36.7 C) 97.8 F (36.6 C)  TempSrc:   Oral Oral  Resp: '20  18 19  '$ Height:  '5\' 10"'$  (1.778 m)    Weight:  181 lb 6.4 oz (82.283 kg)  180 lb 3.3 oz (81.74 kg)  SpO2: 95% 100% 97% 99%    Intake/Output Summary (Last 24 hours) at 05/22/15 1034 Last data filed at 05/22/15 0840  Gross per 24 hour  Intake 464.21 ml  Output      0 ml  Net 464.21 ml   Filed Weights   05/21/15 1214 05/21/15 1640 05/22/15 0421  Weight: 183 lb (83.008 kg) 181 lb 6.4 oz (82.283 kg) 180 lb 3.3 oz (81.74 kg)    PHYSICAL EXAM  General: Pleasant, NAD. Neuro: Alert and oriented X 3. Moves all extremities spontaneously. Psych: Normal affect. HEENT:  Normal  Neck: Supple without bruits or JVD. Lungs:  Resp regular and unlabored, CTA. Heart: RRR no s3, s4, or murmurs. L forearm AVF Abdomen: Soft, non-tender, non-distended, BS + x 4.  Extremities: No clubbing, cyanosis or edema. DP/PT/Radials 2+ and  equal bilaterally.  Accessory Clinical Findings  CBC  Recent Labs  05/21/15 1215 05/22/15 0510  WBC 6.3 7.0  NEUTROABS 4.5  --   HGB 13.7 12.6*  HCT 41.8 38.1*  MCV 98.6 99.2  PLT 156 814   Basic Metabolic Panel  Recent Labs  05/21/15 1215 05/22/15 0510  NA 136 138  K 4.3 4.7  CL 95* 99*  CO2 28 26  GLUCOSE 99 91  BUN 18 35*  CREATININE 4.86* 6.82*  CALCIUM 9.5 9.3   Cardiac Enzymes  Recent Labs  05/21/15 1718 05/21/15 2222 05/22/15 0510  TROPONINI 0.58* 1.37* 1.54*   TELE NSR without recurrent aflutter    ECG  NSR without significant ST-T wave changes  Echocardiogram 04/14/2015  LV EF: 65% -  70%  ------------------------------------------------------------------- Indications:   427.31 Atrial Fibrillation.  ------------------------------------------------------------------- History:  PMH: AVR /23 mm stented Edwards.  ------------------------------------------------------------------- Study Conclusions  - Left ventricle: Systolic function was vigorous. The estimated ejection fraction was in the range of 65% to 70%. Doppler parameters are consistent with abnormal left ventricular relaxation (grade 1 diastolic dysfunction). Doppler parameters are consistent with elevated ventricular end-diastolic filling  pressure. - Aortic valve: There was mild to moderate regurgitation. Mean gradient (S): 11 mm Hg. Peak gradient (S): 22 mm Hg. Valve area (VTI): 1.8 cm^2. Valve area (Vmax): 1.6 cm^2. Valve area (Vmean): 1.64 cm^2. - Mitral valve: Calcified annulus. Severely thickened, moderately calcified leaflets . The findings are consistent with mild stenosis. There was mild regurgitation. Valve area by pressure half-time: 1.28 cm^2. Valve area by continuity equation (using LVOT flow): 1.23 cm^2. - Left atrium: The atrium was moderately dilated. - Right ventricle: The cavity size was mildly dilated. - Pulmonary arteries:  Systolic pressure was within the normal range. - Inferior vena cava: The vessel was normal in size. The respirophasic diameter changes were in the normal range (= 50%), consistent with normal central venous pressure.  Impressions:  - There are normal gradients across the aortic bioprosthesis. There is mild to moderate aortic regurgitation with excentric jet, possibly paravalvular.     ASSESSMENT AND PLAN  1. Paroxysmal atrial flutter/Fibrillation spontaneously converted in the ED at 12:50 PM - CHA2DS2-VASC score 2 (CAD, HTN), occurs in the setting of dialysis - Toprol XL '50mg'$  BID recently cut back to '25mg'$  BID due to hypotension - Antiarrhythmic therapy limited by structural heart disease and end-stage renal disease. - started on coumadin, dropped his ASA. Continue on plavix. Discussed with nephrology team, he has recurrent hypotension during dialysis making rate control therapy with Toprol XL difficult, he also has been having recurrent a-fib mainly during dialysis. Per Dr. Florene Glen, either consider given '50mg'$  daily Toprol XL severe hrs after dialysis to avoid hypotension or use short acting dosage except before dialysis. However this method runs into the issue with recurrent afib/aflutter during dialysis. Home lisinopril discontinued. Home toprol XL currently on hold. Although he does have ESRD, discussed with Dr. Florene Glen, may still consider digoxin three times a week, combination of this digoxin on dialysis days and conversion to short acting metoprolol while holding the AM dose on dialysis days maybe what he needs.   2. Jaw pain but no CP - Diffuse ST depression noted during tachycardia. ST depression resolved after convert to NSR  - serial trop trended up from POC 0.03 to trop I 0.58 --> 1.37 --> 1.54  - will discuss with MD regarding ischemic workup, pain occurred in the setting of ESRD, aflutter with RVR and hypotension  with SBP 80-90s  - per pt had both chest pain and jaw pain during last PCI, only had jaw pain yesterday without CP. If need ischemic workup, may need to hold loading coumadin. May consider myoview in AM.   3. Hypotension with h/o HTN  - nephrology increased EDW by 1kg to 81kg. Will avoid over aggressive dialysis next time.   4. CAD s/p CABG in Aug 2014 with SVG to LAD  5. aortic valve replacement with Edwards 23 mm bioprosthetic valve during CABG in August 2014  6. 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 multiple sclerosis  8. obstructive sleep apnea not compliant on BiPAP  Signed, Woodward Ku Pager: [phone]  The patient was seen, examined and discussed with Almyra Deforest, PA-C and I agree with the above.   59 year old male with ESRD on HD with paroxysmal atrial flutter, started on Metoprolol 50 mg po BID in Jun, that was controlling his rate for the last 3 months. However he developed hypotension with dizziness and his metoprolol was cut in half. He developed another episode of atypical atrial flutter with RVR yesterday, associated with significant  ST depressions in the lateral leads, that resolved with restoration of SR this am. There was also a troponin elevation 0.58 --> 1.54. We will continue to monitor until downtrending. This was associated with jaw pain. We will schedule a Lexiscan nuclear stress test in the am unless his troponin continues to rise and we will perform a LHC. For now continue metoprolol 25 mg po BID as the patient wants to think about starting amiodarone. We will re-discuss tomorrow. This might be his only option for optimal rate control. His left atrial size is 51 mm.  Dorothy Spark 05/22/2015

## 2015-05-22 NOTE — Progress Notes (Signed)
ANTICOAGULATION CONSULT NOTE - Initial Consult  Pharmacy Consult for Heparin and Coumadin Indication: chest pain/ACS and atrial fibrillation  Allergies  Allergen Reactions  . Diphenhydramine Itching    Only with IV doses. Tolerates oral.  . Penicillins Other (See Comments)    migraine  . Adhesive [Tape] Rash    Please use paper tape    Patient Measurements: Height: '5\' 10"'$  (177.8 cm) Weight: 181 lb 6.4 oz (82.283 kg) IBW/kg (Calculated) : 73 Heparin Dosing Weight: 82.3 kg  Vital Signs: Temp: 98.1 F (36.7 C) (09/14 2059) Temp Source: Oral (09/14 2059) BP: 100/67 mmHg (09/14 2059) Pulse Rate: 75 (09/14 2059)  Labs:  Recent Labs  05/21/15 1215 05/21/15 1718 05/22/15 0140  HGB 13.7  --   --   HCT 41.8  --   --   PLT 156  --   --   LABPROT  --  15.4*  --   INR  --  1.21  --   HEPARINUNFRC  --   --  0.20*  CREATININE 4.86*  --   --   TROPONINI  --  0.58*  --    ESRD  Medical History: Past Medical History  Diagnosis Date  . Multiple sclerosis   . CAD (coronary artery disease)   . Aortic heart valve prolapse   . Peripheral vascular disease   . Sleep apnea     ordered bipap with 3L bled in, but pt will not wear  . Atrial fibrillation   . Multiple sclerosis   . 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  He had a renal Tx from 2008 to 2011.  Gets HD in [place], [place] on MWF schedule.    Marland Kitchen Hypertension     Per Renal MD   Assessment:   59 yr old male to begin IV heparin and Coumadin for PAF, noted spontaneously converted.  Patient reports taking Coumadin for a short time years ago, but cannot recall reason.  Baseline PT/INR in process. Platelet count 156.  Takes Plavix daily, no aspirin.  Initial HL is subtherapeutic at 0.20 on heparin 1150 units/hr. Nurse reports no issues with infusion or bleeding.  Goal of Therapy:  INR 2-3 Heparin level  0.3-0.7 units/ml Monitor platelets by anticoagulation protocol: Yes   Plan:  Heparin bolus 2000 units and increase drip to 1400 units/hr. 8h HL Daily HL/CBC   Andrey Cota. Diona Foley, PharmD Clinical Pharmacist Pager [phone] 05/22/2015,2:49 AM

## 2015-05-22 NOTE — Progress Notes (Signed)
Assessment:  1 A flutter, Afib, now NSR, pos troponin now on heparin 2 ESRD 3 Dialysis associated hypotension   Plan: Dialysis in AM Friday with increased EDW.  Subjective: Interval History:   Objective: Vital signs in last 24 hours: Temp:  [97.8 F (36.6 C)-98.1 F (36.7 C)] 97.8 F (36.6 C) (09/15 0421) Pulse Rate:  [64-127] 75 (09/14 2059) Resp:  [14-20] 19 (09/15 0421) BP: (85-114)/(63-76) 108/76 mmHg (09/15 0421) SpO2:  [90 %-100 %] 99 % (09/15 0421) Weight:  [81.74 kg (180 lb 3.3 oz)-83.008 kg (183 lb)] 81.74 kg (180 lb 3.3 oz) (09/15 0421) Weight change:   Intake/Output from previous day: 09/14 0701 - 09/15 0700 In: 224.2 [P.O.:120; I.V.:104.2] Out: -  Intake/Output this shift: Total I/O In: 240 [P.O.:240] Out: -   General appearance: alert and cooperative Resp: clear to auscultation bilaterally Cardio: regular rate and rhythm, S1, S2 normal, no murmur, click, rub or gallop Extremities: extremities normal, atraumatic, no cyanosis, edema    Lab Results:  Recent Labs  05/21/15 1215 05/22/15 0510  WBC 6.3 7.0  HGB 13.7 12.6*  HCT 41.8 38.1*  PLT 156 157   BMET:  Recent Labs  05/21/15 1215 05/22/15 0510  NA 136 138  K 4.3 4.7  CL 95* 99*  CO2 28 26  GLUCOSE 99 91  BUN 18 35*  CREATININE 4.86* 6.82*  CALCIUM 9.5 9.3   No results for input(s): PTH in the last 72 hours. Iron Studies: No results for input(s): IRON, TIBC, TRANSFERRIN, FERRITIN in the last 72 hours. Studies/Results: No results found.  Scheduled: . atorvastatin  40 mg Oral QHS  . calcium acetate  4,002 mg Oral TID WC  . citalopram  20 mg Oral Daily  . clopidogrel  75 mg Oral Q breakfast  . docusate sodium  400 mg Oral BID  . famotidine  20 mg Oral Daily  . folic acid  1 mg Oral Daily  . gabapentin  300 mg Oral QHS  . multivitamin  1 tablet Oral Daily  . patient's guide to using coumadin book   Does not apply Once  . sodium chloride  250 mL Intravenous Once  . warfarin  7.5  mg Oral ONCE-1800  . warfarin   Does not apply Once  . Warfarin - Pharmacist Dosing Inpatient   Does not apply q1800     Deanette Tullius C 05/22/2015,10:47 AM

## 2015-05-22 NOTE — Discharge Instructions (Signed)
Information on my medicine - Coumadin   (Warfarin)  This medication education was reviewed with me or my healthcare representative as part of my discharge preparation.  The pharmacist that spoke with me during my hospital stay was:  Tad Moore, [facility]  Why was Coumadin prescribed for you? Coumadin was prescribed for you because you have a blood clot or a medical condition that can cause an increased risk of forming blood clots. Blood clots can cause serious health problems by blocking the flow of blood to the heart, lung, or brain. Coumadin can prevent harmful blood clots from forming. As a reminder your indication for Coumadin is:   Stroke Prevention Because Of Atrial Fibrillation  What test will check on my response to Coumadin? While on Coumadin (warfarin) you will need to have an INR test regularly to ensure that your dose is keeping you in the desired range. The INR (international normalized ratio) number is calculated from the result of the laboratory test called prothrombin time (PT).  If an INR APPOINTMENT HAS NOT ALREADY BEEN MADE FOR YOU please schedule an appointment to have this lab work done by your health care provider within 7 days. Your INR goal is usually a number between:  2 to 3 or your provider may give you a more narrow range like 2-2.5.  Ask your health care provider during an office visit what your goal INR is.  What  do you need to  know  About  COUMADIN? Take Coumadin (warfarin) exactly as prescribed by your healthcare provider about the same time each day.  DO NOT stop taking without talking to the doctor who prescribed the medication.  Stopping without other blood clot prevention medication to take the place of Coumadin may increase your risk of developing a new clot or stroke.  Get refills before you run out.  What do you do if you miss a dose? If you miss a dose, take it as soon as you remember on the same day then continue your regularly scheduled regimen the next  day.  Do not take two doses of Coumadin at the same time.  Important Safety Information A possible side effect of Coumadin (Warfarin) is an increased risk of bleeding. You should call your healthcare provider right away if you experience any of the following: ? Bleeding from an injury or your nose that does not stop. ? Unusual colored urine (red or dark brown) or unusual colored stools (red or black). ? Unusual bruising for unknown reasons. ? A serious fall or if you hit your head (even if there is no bleeding).  Some foods or medicines interact with Coumadin (warfarin) and might alter your response to warfarin. To help avoid this: ? Eat a balanced diet, maintaining a consistent amount of Vitamin K. ? Notify your provider about major diet changes you plan to make. ? Avoid alcohol or limit your intake to 1 drink for women and 2 drinks for men per day. (1 drink is 5 oz. wine, 12 oz. beer, or 1.5 oz. liquor.)  Make sure that ANY health care provider who prescribes medication for you knows that you are taking Coumadin (warfarin).  Also make sure the healthcare provider who is monitoring your Coumadin knows when you have started a new medication including herbals and non-prescription products.  Coumadin (Warfarin)  Major Drug Interactions  Increased Warfarin Effect Decreased Warfarin Effect  Alcohol (large quantities) Antibiotics (esp. Septra/Bactrim, Flagyl, Cipro) Amiodarone (Cordarone) Aspirin (ASA) Cimetidine (Tagamet) Megestrol (Megace) NSAIDs (ibuprofen,  naproxen, etc.) °Piroxicam (Feldene) °Propafenone (Rythmol SR) °Propranolol (Inderal) °Isoniazid (INH) °Posaconazole (Noxafil) Barbiturates (Phenobarbital) °Carbamazepine (Tegretol) °Chlordiazepoxide (Librium) °Cholestyramine (Questran) °Griseofulvin °Oral Contraceptives °Rifampin °Sucralfate (Carafate) °Vitamin K  ° °Coumadin® (Warfarin) Major Herbal Interactions  °Increased Warfarin Effect Decreased Warfarin Effect   °Garlic °Ginseng °Ginkgo biloba Coenzyme Q10 °Green tea °St. John’s wort   ° °Coumadin® (Warfarin) FOOD Interactions  °Eat a consistent number of servings per week of foods HIGH in Vitamin K °(1 serving = ½ cup)  °Collards (cooked, or boiled & drained) °Kale (cooked, or boiled & drained) °Mustard greens (cooked, or boiled & drained) °Parsley *serving size only = ¼ cup °Spinach (cooked, or boiled & drained) °Swiss chard (cooked, or boiled & drained) °Turnip greens (cooked, or boiled & drained)  °Eat a consistent number of servings per week of foods MEDIUM-HIGH in Vitamin K °(1 serving = 1 cup)  °Asparagus (cooked, or boiled & drained) °Broccoli (cooked, boiled & drained, or raw & chopped) °Brussel sprouts (cooked, or boiled & drained) *serving size only = ½ cup °Lettuce, raw (green leaf, endive, romaine) °Spinach, raw °Turnip greens, raw & chopped  ° °These websites have more information on Coumadin (warfarin):  www.coumadin.com; °www.ahrq.gov/consumer/coumadin.htm; ° ° °

## 2015-05-23 ENCOUNTER — Encounter (HOSPITAL_COMMUNITY): Payer: Medicare Other

## 2015-05-23 ENCOUNTER — Observation Stay (HOSPITAL_COMMUNITY): Payer: Medicare Other

## 2015-05-23 DIAGNOSIS — T8612 Kidney transplant failure: Secondary | ICD-10-CM | POA: Diagnosis present

## 2015-05-23 DIAGNOSIS — I480 Paroxysmal atrial fibrillation: Secondary | ICD-10-CM | POA: Diagnosis not present

## 2015-05-23 DIAGNOSIS — Z955 Presence of coronary angioplasty implant and graft: Secondary | ICD-10-CM | POA: Diagnosis not present

## 2015-05-23 DIAGNOSIS — Z7982 Long term (current) use of aspirin: Secondary | ICD-10-CM | POA: Diagnosis not present

## 2015-05-23 DIAGNOSIS — I2510 Atherosclerotic heart disease of native coronary artery without angina pectoris: Secondary | ICD-10-CM | POA: Diagnosis not present

## 2015-05-23 DIAGNOSIS — E785 Hyperlipidemia, unspecified: Secondary | ICD-10-CM

## 2015-05-23 DIAGNOSIS — R9439 Abnormal result of other cardiovascular function study: Secondary | ICD-10-CM

## 2015-05-23 DIAGNOSIS — N186 End stage renal disease: Secondary | ICD-10-CM | POA: Diagnosis not present

## 2015-05-23 DIAGNOSIS — R931 Abnormal findings on diagnostic imaging of heart and coronary circulation: Secondary | ICD-10-CM | POA: Diagnosis not present

## 2015-05-23 DIAGNOSIS — I739 Peripheral vascular disease, unspecified: Secondary | ICD-10-CM | POA: Diagnosis present

## 2015-05-23 DIAGNOSIS — R079 Chest pain, unspecified: Secondary | ICD-10-CM | POA: Diagnosis not present

## 2015-05-23 DIAGNOSIS — I4892 Unspecified atrial flutter: Secondary | ICD-10-CM | POA: Diagnosis not present

## 2015-05-23 DIAGNOSIS — Z992 Dependence on renal dialysis: Secondary | ICD-10-CM | POA: Diagnosis not present

## 2015-05-23 DIAGNOSIS — Z951 Presence of aortocoronary bypass graft: Secondary | ICD-10-CM | POA: Diagnosis not present

## 2015-05-23 DIAGNOSIS — Z87891 Personal history of nicotine dependence: Secondary | ICD-10-CM | POA: Diagnosis not present

## 2015-05-23 DIAGNOSIS — I951 Orthostatic hypotension: Secondary | ICD-10-CM | POA: Diagnosis not present

## 2015-05-23 DIAGNOSIS — I10 Essential (primary) hypertension: Secondary | ICD-10-CM | POA: Diagnosis not present

## 2015-05-23 DIAGNOSIS — I252 Old myocardial infarction: Secondary | ICD-10-CM | POA: Diagnosis not present

## 2015-05-23 DIAGNOSIS — I4891 Unspecified atrial fibrillation: Secondary | ICD-10-CM | POA: Diagnosis not present

## 2015-05-23 DIAGNOSIS — G35 Multiple sclerosis: Secondary | ICD-10-CM | POA: Diagnosis present

## 2015-05-23 DIAGNOSIS — I259 Chronic ischemic heart disease, unspecified: Secondary | ICD-10-CM | POA: Diagnosis not present

## 2015-05-23 DIAGNOSIS — I484 Atypical atrial flutter: Secondary | ICD-10-CM | POA: Diagnosis present

## 2015-05-23 DIAGNOSIS — E875 Hyperkalemia: Secondary | ICD-10-CM | POA: Diagnosis present

## 2015-05-23 DIAGNOSIS — I5041 Acute combined systolic (congestive) and diastolic (congestive) heart failure: Secondary | ICD-10-CM | POA: Diagnosis not present

## 2015-05-23 DIAGNOSIS — I25118 Atherosclerotic heart disease of native coronary artery with other forms of angina pectoris: Secondary | ICD-10-CM | POA: Diagnosis not present

## 2015-05-23 DIAGNOSIS — G4733 Obstructive sleep apnea (adult) (pediatric): Secondary | ICD-10-CM | POA: Diagnosis present

## 2015-05-23 DIAGNOSIS — I120 Hypertensive chronic kidney disease with stage 5 chronic kidney disease or end stage renal disease: Secondary | ICD-10-CM | POA: Diagnosis present

## 2015-05-23 DIAGNOSIS — N2581 Secondary hyperparathyroidism of renal origin: Secondary | ICD-10-CM | POA: Diagnosis present

## 2015-05-23 DIAGNOSIS — Z953 Presence of xenogenic heart valve: Secondary | ICD-10-CM | POA: Diagnosis not present

## 2015-05-23 DIAGNOSIS — Z7902 Long term (current) use of antithrombotics/antiplatelets: Secondary | ICD-10-CM | POA: Diagnosis not present

## 2015-05-23 LAB — NM MYOCAR MULTI W/SPECT W/WALL MOTION / EF
Estimated workload: 1 METS
LV dias vol: 129 mL
LV sys vol: 100 mL
MPHR: 160 {beats}/min
Peak HR: 81 {beats}/min
Percent HR: 50 %
RATE: 0.26
Rest HR: 61 {beats}/min
SDS: 6
SRS: 10
SSS: 12
TID: 1.1

## 2015-05-23 LAB — RENAL FUNCTION PANEL
ALBUMIN: 3.1 g/dL — AB (ref 3.5–5.0)
Anion gap: 15 (ref 5–15)
BUN: 59 mg/dL — AB (ref 6–20)
CALCIUM: 9.4 mg/dL (ref 8.9–10.3)
CO2: 23 mmol/L (ref 22–32)
Chloride: 99 mmol/L — ABNORMAL LOW (ref 101–111)
Creatinine, Ser: 9.4 mg/dL — ABNORMAL HIGH (ref 0.61–1.24)
GFR calc Af Amer: 6 mL/min — ABNORMAL LOW (ref >60)
GFR, EST NON AFRICAN AMERICAN: 5 mL/min — AB (ref >60)
Glucose, Bld: 119 mg/dL — ABNORMAL HIGH (ref 65–99)
PHOSPHORUS: 8.6 mg/dL — AB (ref 2.5–4.6)
POTASSIUM: 5.5 mmol/L — AB (ref 3.5–5.1)
Sodium: 137 mmol/L (ref 135–145)

## 2015-05-23 LAB — CBC
HEMATOCRIT: 37.3 % — AB (ref 39.0–52.0)
HEMATOCRIT: 37.3 % — AB (ref 39.0–52.0)
HEMOGLOBIN: 12.2 g/dL — AB (ref 13.0–17.0)
Hemoglobin: 12.1 g/dL — ABNORMAL LOW (ref 13.0–17.0)
MCH: 32.4 pg (ref 26.0–34.0)
MCH: 31.8 pg (ref 26.0–34.0)
MCHC: 32.7 g/dL (ref 30.0–36.0)
MCHC: 32.4 g/dL (ref 30.0–36.0)
MCV: 98.9 fL (ref 78.0–100.0)
MCV: 97.9 fL (ref 78.0–100.0)
PLATELETS: 156 10*3/uL (ref 150–400)
Platelets: 159 10*3/uL (ref 150–400)
RBC: 3.77 MIL/uL — AB (ref 4.22–5.81)
RBC: 3.81 MIL/uL — ABNORMAL LOW (ref 4.22–5.81)
RDW: 14.7 % (ref 11.5–15.5)
RDW: 14.6 % (ref 11.5–15.5)
WBC: 6.3 10*3/uL (ref 4.0–10.5)
WBC: 8.3 10*3/uL (ref 4.0–10.5)

## 2015-05-23 LAB — PROTIME-INR
INR: 1.33 (ref 0.00–1.49)
PROTHROMBIN TIME: 16.6 s — AB (ref 11.6–15.2)

## 2015-05-23 LAB — TROPONIN I: Troponin I: 1.09 ng/mL (ref <0.031)

## 2015-05-23 LAB — HEPARIN LEVEL (UNFRACTIONATED): Heparin Unfractionated: 0.32 IU/mL (ref 0.30–0.70)

## 2015-05-23 MED ORDER — REGADENOSON 0.4 MG/5ML IV SOLN
INTRAVENOUS | Status: AC
  Administered 2015-05-23: 0.4 mg
  Filled 2015-05-23: qty 5

## 2015-05-23 MED ORDER — LIDOCAINE HCL (PF) 1 % IJ SOLN: 5.0000 mL | INTRAMUSCULAR | Status: DC | PRN

## 2015-05-23 MED ORDER — SODIUM CHLORIDE 0.9 % IV SOLN: 100.0000 mL | INTRAVENOUS | Status: DC | PRN

## 2015-05-23 MED ORDER — ALTEPLASE 2 MG IJ SOLR
2.0000 mg | Freq: Once | INTRAMUSCULAR | Status: DC | PRN
  Filled 2015-05-23: qty 2

## 2015-05-23 MED ORDER — PENTAFLUOROPROP-TETRAFLUOROETH EX AERO: 1.0000 "application " | INHALATION_SPRAY | CUTANEOUS | Status: DC | PRN

## 2015-05-23 MED ORDER — HEPARIN SODIUM (PORCINE) 1000 UNIT/ML DIALYSIS: 1000.0000 [IU] | INTRAMUSCULAR | Status: DC | PRN

## 2015-05-23 MED ORDER — TECHNETIUM TC 99M SESTAMIBI GENERIC - CARDIOLITE
10.0000 | Freq: Once | INTRAVENOUS | Status: AC | PRN
  Administered 2015-05-23: 10 via INTRAVENOUS
  Filled 2015-05-23: qty 10

## 2015-05-23 MED ORDER — TECHNETIUM TC 99M SESTAMIBI GENERIC - CARDIOLITE
30.0000 | Freq: Once | INTRAVENOUS | Status: AC | PRN
  Administered 2015-05-23: 30 via INTRAVENOUS
  Filled 2015-05-23: qty 30

## 2015-05-23 MED ORDER — LIDOCAINE-PRILOCAINE 2.5-2.5 % EX CREA
1.0000 | TOPICAL_CREAM | CUTANEOUS | Status: DC | PRN
Start: 2015-05-23 — End: 2015-05-23

## 2015-05-23 MED ORDER — WARFARIN SODIUM 10 MG PO TABS
10.0000 mg | ORAL_TABLET | Freq: Once | ORAL | Status: AC
  Administered 2015-05-23: 10 mg via ORAL
  Filled 2015-05-23: qty 1

## 2015-05-23 NOTE — Progress Notes (Signed)
Patient Name: [name] Date of Encounter: 05/23/2015  Primary Cardiologist: Dr. Angelena Form, [place]([facility])    Active Problems:   Atrial flutter with rapid ventricular response   Hypertension    SUBJECTIVE  Seen in nuc med for nuclear stress test. He tolerated the procedure well. He has no complaints.   CURRENT MEDS . atorvastatin  40 mg Oral QHS  . calcium acetate  4,002 mg Oral TID WC  . citalopram  20 mg Oral Daily  . clopidogrel  75 mg Oral Q breakfast  . docusate sodium  400 mg Oral BID  . famotidine  20 mg Oral Daily  . folic acid  1 mg Oral Daily  . gabapentin  300 mg Oral QHS  . metoprolol tartrate  25 mg Oral BID  . multivitamin  1 tablet Oral Daily  . regadenoson      . sodium chloride  250 mL Intravenous Once  . Warfarin - Pharmacist Dosing Inpatient   Does not apply q1800    OBJECTIVE  Filed Vitals:   05/22/15 0421 05/22/15 1353 05/22/15 2132 05/23/15 0505  BP: 108/76 105/62 140/80 155/76  Pulse:  76 73 61  Temp: 97.8 F (36.6 C) 98 F (36.7 C) 98.7 F (37.1 C) 97.8 F (36.6 C)  TempSrc: Oral Oral Oral Oral  Resp: '19 18 18 18  '$ Height:      Weight: 180 lb 3.3 oz (81.74 kg)   179 lb 10.8 oz (81.5 kg)  SpO2: 99% 98% 98% 100%    Intake/Output Summary (Last 24 hours) at 05/23/15 0905 Last data filed at 05/22/15 1800  Gross per 24 hour  Intake    600 ml  Output      0 ml  Net    600 ml   Filed Weights   05/21/15 1640 05/22/15 0421 05/23/15 0505  Weight: 181 lb 6.4 oz (82.283 kg) 180 lb 3.3 oz (81.74 kg) 179 lb 10.8 oz (81.5 kg)    PHYSICAL EXAM  General: Pleasant, NAD. Neuro: Alert and oriented X 3. Moves all extremities spontaneously. Psych: Normal affect. HEENT:  Normal  Neck: Supple without bruits or JVD. Lungs:  Resp regular and unlabored, CTA. Heart: RRR no s3, s4, or murmurs. L forearm AVF Abdomen: Soft, non-tender, non-distended, BS + x 4.  Extremities: No clubbing, cyanosis or edema. DP/PT/Radials 2+ and equal  bilaterally.  Accessory Clinical Findings  CBC  Recent Labs  05/21/15 1215 05/22/15 0510 05/23/15 0132  WBC 6.3 7.0 6.3  NEUTROABS 4.5  --   --   HGB 13.7 12.6* 12.2*  HCT 41.8 38.1* 37.3*  MCV 98.6 99.2 98.9  PLT 156 157 174   Basic Metabolic Panel  Recent Labs  05/21/15 1215 05/22/15 0510  NA 136 138  K 4.3 4.7  CL 95* 99*  CO2 28 26  GLUCOSE 99 91  BUN 18 35*  CREATININE 4.86* 6.82*  CALCIUM 9.5 9.3   Cardiac Enzymes  Recent Labs  05/22/15 1512 05/22/15 2116 05/23/15 0132  TROPONINI 1.24* 1.13* 1.09*   TELE NSR without recurrent aflutter    ECG  NSR without significant ST-T wave changes  Echocardiogram 04/14/2015  LV EF: 65% -  70%  ------------------------------------------------------------------- Indications:   427.31 Atrial Fibrillation.  ------------------------------------------------------------------- History:  PMH: AVR /23 mm stented Edwards.  ------------------------------------------------------------------- Study Conclusions  - Left ventricle: Systolic function was vigorous. The estimated ejection fraction was in the range of 65% to 70%. Doppler parameters are consistent with abnormal left ventricular relaxation (grade 1  diastolic dysfunction). Doppler parameters are consistent with elevated ventricular end-diastolic filling pressure. - Aortic valve: There was mild to moderate regurgitation. Mean gradient (S): 11 mm Hg. Peak gradient (S): 22 mm Hg. Valve area (VTI): 1.8 cm^2. Valve area (Vmax): 1.6 cm^2. Valve area (Vmean): 1.64 cm^2. - Mitral valve: Calcified annulus. Severely thickened, moderately calcified leaflets . The findings are consistent with mild stenosis. There was mild regurgitation. Valve area by pressure half-time: 1.28 cm^2. Valve area by continuity equation (using LVOT flow): 1.23 cm^2. - Left atrium: The atrium was moderately dilated. - Right ventricle: The cavity size was  mildly dilated. - Pulmonary arteries: Systolic pressure was within the normal range. - Inferior vena cava: The vessel was normal in size. The respirophasic diameter changes were in the normal range (= 50%), consistent with normal central venous pressure.  Impressions:  - There are normal gradients across the aortic bioprosthesis. There is mild to moderate aortic regurgitation with excentric jet, possibly paravalvular.     ASSESSMENT AND PLAN  1. Paroxysmal atrial flutter/Fibrillation spontaneously converted in the ED at 12:50 PM - CHA2DS2-VASC score 2 (CAD, HTN), occurs in the setting of dialysis - Toprol XL '50mg'$  BID recently cut back to '25mg'$  BID due to hypotension - Antiarrhythmic therapy limited by structural heart disease and end-stage renal disease. - started on coumadin, dropped his ASA. Continue on plavix. Discussed with nephrology team, he has recurrent hypotension during dialysis making rate control therapy difficult.  -For now continue metoprolol 25 mg po BID as the patient wants to think about starting amiodarone. We will re-discuss tomorrow. This might be his only option for optimal rate control. His left atrial size is 51 mm.  2. Jaw pain but no CP - Diffuse ST depression noted during tachycardia. ST depression resolved after convert to NSR  - serial trop trended up from POC 0.03 to trop I 0.58 --> 1.37 --> 1.54--> 1.09  - undergoing nuclear stress test today to r/o ischemia. Pending nuclear images.   3. Hypotension with h/o HTN  - nephrology increased EDW by 1kg to 81kg. Will avoid over aggressive dialysis next time.   4. CAD s/p CABG in Aug 2014 with SVG to LAD  5. Aortic valve replacement with Edwards 23 mm bioprosthetic valve during CABG in August 2014  6. 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 multiple sclerosis  8. obstructive sleep apnea not compliant on  BiPAP  Signed, THOMPSON, KATHRYN R PA-C   The patient was seen, examined and discussed with Nell Range, PA-C and I agree with the above.   59 year old male with h/o CAD, CABG, AVR in 2014, PAF, ESRD on HD who presented with a paroxysm of PAF and spontaneously cardioverted to SR in the ED. He also complained of chest pain A nuclear stress test today showed new low LV 22% (65-70% on echo on 04/14/15) and apical ischemia. We will schedule a left cardiac cath for Monday.   Dorothy Spark 05/23/2015

## 2015-05-23 NOTE — Progress Notes (Signed)
[place] for Heparin and Coumadin Indication: chest pain/ACS and atrial fibrillation  Allergies  Allergen Reactions  . Diphenhydramine Itching    Only with IV doses. Tolerates oral.  . Penicillins Other (See Comments)    migraine  . Adhesive [Tape] Rash    Please use paper tape    Patient Measurements: Height: '5\' 10"'$  (177.8 cm) Weight: 179 lb 10.8 oz (81.5 kg) IBW/kg (Calculated) : 73 Heparin Dosing Weight: 82.3 kg  Vital Signs: Temp: 97.8 F (36.6 C) (09/16 0505) Temp Source: Oral (09/16 0505) BP: 118/90 mmHg (09/16 0913) Pulse Rate: 70 (09/16 0913)  Labs:  Recent Labs  05/21/15 1215 05/21/15 1718  05/22/15 0140 05/22/15 0510 05/22/15 0957 05/22/15 1512 05/22/15 2116 05/23/15 0132  HGB 13.7  --   --   --  12.6*  --   --   --  12.2*  HCT 41.8  --   --   --  38.1*  --   --   --  37.3*  PLT 156  --   --   --  157  --   --   --  159  LABPROT  --  15.4*  --   --  15.9*  --   --   --  16.6*  INR  --  1.21  --   --  1.26  --   --   --  1.33  HEPARINUNFRC  --   --   --  0.20*  --  0.39  --   --  0.32  CREATININE 4.86*  --   --   --  6.82*  --   --   --   --   TROPONINI  --  0.58*  < >  --  1.54*  --  1.24* 1.13* 1.09*  < > = values in this interval not displayed. ESRD  Assessment: 59 yr old male to begin IV heparin and Coumadin for PAF, noted spontaneously converted.  Patient reports taking Coumadin for a short time years ago, but cannot recall reason.   Heparin level now therapeutic, CBC ok, INR slowly trending up.  Goal of Therapy:  INR 2-3 Heparin level 0.3-0.7 units/ml Monitor platelets by anticoagulation protocol: Yes   Plan:  Heparin to 1500 units / hr to prevent drop to less than 0.3 Coumadin 10 mg po x 1 Daily HL/CBC/INR  Thank you Anette Guarneri, PharmD [phone]  05/23/2015,10:04 AM

## 2015-05-23 NOTE — Procedures (Signed)
S/P Stress test today. Initiating hemodialysis. Stable hemodynamics. Anticoagulant management will need to be a non renal physician.  POWELL,ALVIN C

## 2015-05-24 DIAGNOSIS — I5041 Acute combined systolic (congestive) and diastolic (congestive) heart failure: Secondary | ICD-10-CM

## 2015-05-24 DIAGNOSIS — I420 Dilated cardiomyopathy: Secondary | ICD-10-CM

## 2015-05-24 LAB — CBC
HCT: 36.7 % — ABNORMAL LOW (ref 39.0–52.0)
Hemoglobin: 11.7 g/dL — ABNORMAL LOW (ref 13.0–17.0)
MCH: 31.8 pg (ref 26.0–34.0)
MCHC: 31.9 g/dL (ref 30.0–36.0)
MCV: 99.7 fL (ref 78.0–100.0)
Platelets: 141 10*3/uL — ABNORMAL LOW (ref 150–400)
RBC: 3.68 MIL/uL — ABNORMAL LOW (ref 4.22–5.81)
RDW: 14.5 % (ref 11.5–15.5)
WBC: 6.1 10*3/uL (ref 4.0–10.5)

## 2015-05-24 LAB — HEPARIN LEVEL (UNFRACTIONATED): Heparin Unfractionated: 0.45 IU/mL (ref 0.30–0.70)

## 2015-05-24 LAB — PROTIME-INR
INR: 1.78 — AB (ref 0.00–1.49)
Prothrombin Time: 20.7 seconds — ABNORMAL HIGH (ref 11.6–15.2)

## 2015-05-24 MED ORDER — WARFARIN SODIUM 10 MG PO TABS: 10.0000 mg | ORAL_TABLET | Freq: Once | ORAL | Status: DC

## 2015-05-24 NOTE — Progress Notes (Addendum)
ANTICOAGULATION CONSULT NOTE - Follow Up Consult  Pharmacy Consult for Heparin and Coumadin Indication: chest pain/ACS and atrial fibrillation  Allergies  Allergen Reactions  . Diphenhydramine Itching    Only with IV doses. Tolerates oral.  . Penicillins Other (See Comments)    migraine  . Adhesive [Tape] Rash    Please use paper tape    Patient Measurements: Height: '5\' 10"'$  (177.8 cm) Weight: 182 lb 4.8 oz (82.691 kg) IBW/kg (Calculated) : 73 Heparin Dosing Weight: 82.3 kg  Vital Signs: Temp: 98.6 F (37 C) (09/17 0500) BP: 147/71 mmHg (09/17 0500) Pulse Rate: 60 (09/17 0500)  Labs:  Recent Labs  05/21/15 1215 05/21/15 1718  05/22/15 0140 05/22/15 0510 05/22/15 0957 05/22/15 1512 05/22/15 2116 05/23/15 0132 05/23/15 1100 05/24/15 0810  HGB 13.7  --   --   --  12.6*  --   --   --  12.2* 12.1* 11.7*  HCT 41.8  --   --   --  38.1*  --   --   --  37.3* 37.3* 36.7*  PLT 156  --   --   --  157  --   --   --  159 156 141*  LABPROT  --  15.4*  --   --  15.9*  --   --   --  16.6*  --   --   INR  --  1.21  --   --  1.26  --   --   --  1.33  --   --   HEPARINUNFRC  --   --   --  0.20*  --  0.39  --   --  0.32  --   --   CREATININE 4.86*  --   --   --  6.82*  --   --   --   --  9.40*  --   TROPONINI  --  0.58*  < >  --  1.54*  --  1.24* 1.13* 1.09*  --   --   < > = values in this interval not displayed.  Estimated Creatinine Clearance: 8.6 mL/min (by C-G formula based on Cr of 9.4).   Medications:  Scheduled:  . atorvastatin  40 mg Oral QHS  . calcium acetate  4,002 mg Oral TID WC  . citalopram  20 mg Oral Daily  . clopidogrel  75 mg Oral Q breakfast  . docusate sodium  400 mg Oral BID  . famotidine  20 mg Oral Daily  . folic acid  1 mg Oral Daily  . gabapentin  300 mg Oral QHS  . metoprolol tartrate  25 mg Oral BID  . multivitamin  1 tablet Oral Daily  . sodium chloride  250 mL Intravenous Once  . Warfarin - Pharmacist Dosing Inpatient   Does not apply q1800    Infusions:  . heparin 1,500 Units/hr (05/23/15 1117)   PRN: acetaminophen, acetaminophen, albuterol, fluticasone, nitroGLYCERIN, ondansetron (ZOFRAN) IV  Assessment: 59 YOM to begin IV heparin and warfarin for PAF, noted spontaneously converted.  Pt reports taking warfarin for a short time years ago but cannot recall reason.  On Plavix 75 mg and d/c ASA per cardiology.  CHA2DS2-VASC = 2.   Heparin therapeutic with HL of 0.45, INR remains subtherapeutic at 1.78 following  7.5 mg x2 then 10 mg x1. INR: 1.21>1.26> 1.33>1.78 , Hgb stable 11.7, Plts 141.   9/17 1200: RN called and pt is bleeding from fistula.  Called Dr Marlou Porch and  will hold Heparin for now.   MD will also hold coumadin 2/2 upcoming heart cath on Monday.   Goal of Therapy:  INR 2-3  Heparin level 0.3-0.7 units/mL Monitor platelets by anticoagulation protocol: Yes   Plan:  Hold Heparin for bleeding.  Hold warfarin for upcoming heart cath on Monday.  Daily HL/CBC/INR Monitor for s/sx of bleeding   Bennye Alm, PharmD Pharmacy Resident [phone]

## 2015-05-24 NOTE — Progress Notes (Signed)
Expand All Collapse All   Assessment: 1 A flutter, Afib, now NSR, pos troponin now on heparin; for cath Monday 2 ESRD 3 Dialysis associated hypotension  Plan: Dialysis post cath Monday with new increased EDW.     Subjective: Interval History: Had good dialysis yesterday with no adverse events  Objective: Vital signs in last 24 hours: Temp:  [98 F (36.7 C)-98.6 F (37 C)] 98.6 F (37 C) (09/17 0500) Pulse Rate:  [57-88] 57 (09/17 1022) Resp:  [13-18] 13 (09/17 0500) BP: (98-156)/(68-91) 156/72 mmHg (09/17 1022) SpO2:  [94 %-100 %] 97 % (09/17 0500) Weight:  [82.1 kg (181 lb)-82.691 kg (182 lb 4.8 oz)] 82.691 kg (182 lb 4.8 oz) (09/17 0500) Weight change: 2.2 kg (4 lb 13.6 oz)  Intake/Output from previous day: 09/16 0701 - 09/17 0700 In: 120 [P.O.:120] Out: 1000  Intake/Output this shift:    General appearance: alert and cooperative Resp: clear to auscultation bilaterally Chest wall: no tenderness Cardio: regular rate and rhythm, S1, S2 normal, no murmur, click, rub or gallop Extremities: extremities normal, atraumatic, no cyanosis or edema  AVF LUE  Lab Results:  Recent Labs  05/23/15 1100 05/24/15 0810  WBC 8.3 6.1  HGB 12.1* 11.7*  HCT 37.3* 36.7*  PLT 156 141*   BMET:  Recent Labs  05/22/15 0510 05/23/15 1100  NA 138 137  K 4.7 5.5*  CL 99* 99*  CO2 26 23  GLUCOSE 91 119*  BUN 35* 59*  CREATININE 6.82* 9.40*  CALCIUM 9.3 9.4   No results for input(s): PTH in the last 72 hours. Iron Studies: No results for input(s): IRON, TIBC, TRANSFERRIN, FERRITIN in the last 72 hours. Studies/Results: Nm Myocar Multi W/spect W/wall Motion / Ef  05/23/2015    There was no ST segment deviation noted during stress.  Findings consistent with ischemia and prior myocardial infarction.  This is a high risk study.  The left ventricular ejection fraction is severely decreased (<30%).   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  Apical ischemia EF 22%     Scheduled: . atorvastatin  40 mg Oral QHS  . calcium acetate  4,002 mg Oral TID WC  . citalopram  20 mg Oral Daily  . clopidogrel  75 mg Oral Q breakfast  . docusate sodium  400 mg Oral BID  . famotidine  20 mg Oral Daily  . folic acid  1 mg Oral Daily  . gabapentin  300 mg Oral QHS  . metoprolol tartrate  25 mg Oral BID  . multivitamin  1 tablet Oral Daily  . sodium chloride  250 mL Intravenous Once    LOS: 1 day   POWELL,ALVIN C 05/24/2015,12:37 PM

## 2015-05-24 NOTE — Progress Notes (Signed)
Patient Name: [name] Date of Encounter: 05/24/2015  Primary Cardiologist: Dr. Angelena Form, [place]([facility])    Active Problems:   Atrial flutter with rapid ventricular response   Hypertension    SUBJECTIVE   seems a doing fairly well currently. No active chest pain. He did however have a markedly abnormal nuclear stress test with ejection fraction now 20% with apical ischemia. Because of this, heart catheterization as planned on Monday. Currently holding Coumadin.  CURRENT MEDS . atorvastatin  40 mg Oral QHS  . calcium acetate  4,002 mg Oral TID WC  . citalopram  20 mg Oral Daily  . clopidogrel  75 mg Oral Q breakfast  . docusate sodium  400 mg Oral BID  . famotidine  20 mg Oral Daily  . folic acid  1 mg Oral Daily  . gabapentin  300 mg Oral QHS  . metoprolol tartrate  25 mg Oral BID  . multivitamin  1 tablet Oral Daily  . sodium chloride  250 mL Intravenous Once  . warfarin  10 mg Oral ONCE-1800  . Warfarin - Pharmacist Dosing Inpatient   Does not apply q1800    OBJECTIVE  Filed Vitals:   05/23/15 1555 05/23/15 1739 05/23/15 2100 05/24/15 0500  BP: 134/90 131/75 147/91 147/71  Pulse: 84 88 66 60  Temp: 98 F (36.7 C) 98.6 F (37 C) 98.5 F (36.9 C) 98.6 F (37 C)  TempSrc: Oral Oral    Resp: '18 18 18 13  '$ Height:      Weight: 181 lb (82.1 kg)   182 lb 4.8 oz (82.691 kg)  SpO2: 100% 96% 94% 97%    Intake/Output Summary (Last 24 hours) at 05/24/15 1000 Last data filed at 05/24/15 0500  Gross per 24 hour  Intake    120 ml  Output   1000 ml  Net   -880 ml   Filed Weights   05/23/15 1143 05/23/15 1555 05/24/15 0500  Weight: 184 lb 8.4 oz (83.7 kg) 181 lb (82.1 kg) 182 lb 4.8 oz (82.691 kg)    PHYSICAL EXAM  General: Pleasant, NAD. Neuro: Alert and oriented X 3. Moves all extremities spontaneously. Psych: Normal affect. HEENT:  Normal  Neck: Supple without bruits or JVD. Lungs:  Resp regular and unlabored, CTA. Heart: RRR no s3, s4, soft  systolic murmur (bioprosthetic AV). L forearm AVF Abdomen: Soft, non-tender, non-distended, BS + x 4.  Extremities: No clubbing, cyanosis or edema. DP/PT/Radials 2+ and equal bilaterally.  Accessory Clinical Findings  CBC  Recent Labs  05/21/15 1215  05/23/15 1100 05/24/15 0810  WBC 6.3  < > 8.3 6.1  NEUTROABS 4.5  --   --   --   HGB 13.7  < > 12.1* 11.7*  HCT 41.8  < > 37.3* 36.7*  MCV 98.6  < > 97.9 99.7  PLT 156  < > 156 141*  < > = values in this interval not displayed. Basic Metabolic Panel  Recent Labs  05/22/15 0510 05/23/15 1100  NA 138 137  K 4.7 5.5*  CL 99* 99*  CO2 26 23  GLUCOSE 91 119*  BUN 35* 59*  CREATININE 6.82* 9.40*  CALCIUM 9.3 9.4  PHOS  --  8.6*   Cardiac Enzymes  Recent Labs  05/22/15 1512 05/22/15 2116 05/23/15 0132  TROPONINI 1.24* 1.13* 1.09*   TELE NSR without recurrent aflutter    ECG  NSR without significant ST-T wave changes  Echocardiogram 04/14/2015  LV EF: 65% -  70%  -------------------------------------------------------------------  Indications:   427.31 Atrial Fibrillation.  ------------------------------------------------------------------- History:  PMH: AVR /23 mm stented Edwards.  ------------------------------------------------------------------- Study Conclusions  - Left ventricle: Systolic function was vigorous. The estimated ejection fraction was in the range of 65% to 70%. Doppler parameters are consistent with abnormal left ventricular relaxation (grade 1 diastolic dysfunction). Doppler parameters are consistent with elevated ventricular end-diastolic filling pressure. - Aortic valve: There was mild to moderate regurgitation. Mean gradient (S): 11 mm Hg. Peak gradient (S): 22 mm Hg. Valve area (VTI): 1.8 cm^2. Valve area (Vmax): 1.6 cm^2. Valve area (Vmean): 1.64 cm^2. - Mitral valve: Calcified annulus. Severely thickened, moderately calcified leaflets . The findings are  consistent with mild stenosis. There was mild regurgitation. Valve area by pressure half-time: 1.28 cm^2. Valve area by continuity equation (using LVOT flow): 1.23 cm^2. - Left atrium: The atrium was moderately dilated. - Right ventricle: The cavity size was mildly dilated. - Pulmonary arteries: Systolic pressure was within the normal range. - Inferior vena cava: The vessel was normal in size. The respirophasic diameter changes were in the normal range (= 50%), consistent with normal central venous pressure.  Impressions:  - There are normal gradients across the aortic bioprosthesis. There is mild to moderate aortic regurgitation with excentric jet, possibly paravalvular.     ASSESSMENT AND PLAN  1. Paroxysmal atrial flutter/Fibrillation spontaneously converted in the ED at 12:50 PM - CHA2DS2-VASC score 2 (CAD, HTN), occurs in the setting of dialysis - Toprol XL '50mg'$  BID recently cut back to '25mg'$  BID due to hypotension - Antiarrhythmic therapy limited by structural heart disease and end-stage renal disease. - Holding new start Coumadin secondary to upcoming heart catheterization on Monday. Continue on plavix. Discussed with nephrology team, he has recurrent hypotension during dialysis making rate control therapy difficult.  -For now continue metoprolol 25 mg po BID as the patient wants to think about starting amiodarone. Right now, we will continue with metoprolol. This might be his only option for optimal rate control. His left atrial size is 51 mm.  2. Abnormal nuclear stress test-associated jaw pain but no chest pain - Diffuse ST depression noted during tachycardia. ST depression resolved after convert to NSR  - serial trop trended up from POC 0.03 to trop I 0.58 --> 1.37 --> 1.54--> 1.09  - Nuclear stress test demonstrated EF of 22% and apical ischemia. Plan is for left heart catheterization Monday. (65-70%  on echo on 04/14/15). If INR is greater than 1.8, we may need to hold his catheterization until less than this value. He understands this.  3. Hypotension with h/o HTN  - nephrology increased EDW by 1kg to 81kg. Will avoid over aggressive dialysis next time.   4. CAD s/p CABG in Aug 2014 with SVG to LAD, we will evaluate with heart catheterization Monday.  5. Aortic valve replacement with Edwards 23 mm bioprosthetic valve during CABG in August 2014  6. 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 multiple sclerosis   8. obstructive sleep apnea not compliant on BiPAP   SKAINS, MARK 05/24/2015

## 2015-05-25 DIAGNOSIS — R931 Abnormal findings on diagnostic imaging of heart and coronary circulation: Secondary | ICD-10-CM

## 2015-05-25 LAB — CBC
HEMATOCRIT: 35.5 % — AB (ref 39.0–52.0)
HEMOGLOBIN: 11.7 g/dL — AB (ref 13.0–17.0)
MCH: 32.9 pg (ref 26.0–34.0)
MCHC: 33 g/dL (ref 30.0–36.0)
MCV: 99.7 fL (ref 78.0–100.0)
Platelets: 133 10*3/uL — ABNORMAL LOW (ref 150–400)
RBC: 3.56 MIL/uL — ABNORMAL LOW (ref 4.22–5.81)
RDW: 14.5 % (ref 11.5–15.5)
WBC: 6.6 10*3/uL (ref 4.0–10.5)

## 2015-05-25 LAB — HEPARIN LEVEL (UNFRACTIONATED): Heparin Unfractionated: <0.1 IU/mL — ABNORMAL LOW (ref 0.30–0.70)

## 2015-05-25 LAB — PROTIME-INR
INR: 2.2 — AB (ref 0.00–1.49)
PROTHROMBIN TIME: 24.3 s — AB (ref 11.6–15.2)

## 2015-05-25 MED ORDER — ZOLPIDEM TARTRATE 5 MG PO TABS
5.0000 mg | ORAL_TABLET | Freq: Every evening | ORAL | Status: DC | PRN
  Administered 2015-05-25 – 2015-05-26 (×2): 5 mg via ORAL
  Filled 2015-05-25 (×2): qty 1

## 2015-05-25 NOTE — Progress Notes (Signed)
Patient Name: [name] Date of Encounter: 05/25/2015  Primary Cardiologist: Dr. Angelena Form, [place]([facility])    Active Problems:   Atrial flutter with rapid ventricular response   Hypertension    SUBJECTIVE   no new complaints. No active chest pain. He did however have a markedly abnormal nuclear stress test with ejection fraction now 20% with apical ischemia. Because of this, heart catheterization as planned on Monday. Currently holding Coumadin. Also holding heparin currently because of bleeding around fistula.  CURRENT MEDS . atorvastatin  40 mg Oral QHS  . calcium acetate  4,002 mg Oral TID WC  . citalopram  20 mg Oral Daily  . clopidogrel  75 mg Oral Q breakfast  . docusate sodium  400 mg Oral BID  . famotidine  20 mg Oral Daily  . folic acid  1 mg Oral Daily  . gabapentin  300 mg Oral QHS  . metoprolol tartrate  25 mg Oral BID  . multivitamin  1 tablet Oral Daily  . sodium chloride  250 mL Intravenous Once    OBJECTIVE  Filed Vitals:   05/24/15 2053 05/25/15 0022 05/25/15 0500 05/25/15 0946  BP: 183/76 157/78 146/73 168/87  Pulse: 58 58 58 55  Temp:  98.2 F (36.8 C) 98 F (36.7 C)   TempSrc:  Oral Oral   Resp: 20     Height:      Weight:   184 lb 8 oz (83.689 kg)   SpO2: 96% 95% 95%     Intake/Output Summary (Last 24 hours) at 05/25/15 0955 Last data filed at 05/24/15 2000  Gross per 24 hour  Intake    120 ml  Output      0 ml  Net    120 ml   Filed Weights   05/23/15 1555 05/24/15 0500 05/25/15 0500  Weight: 181 lb (82.1 kg) 182 lb 4.8 oz (82.691 kg) 184 lb 8 oz (83.689 kg)    PHYSICAL EXAM  General: Pleasant, NAD. Neuro: Alert and oriented X 3. Moves all extremities spontaneously. Psych: Normal affect. HEENT:  Normal  Neck: Supple without bruits or JVD. Lungs:  Resp regular and unlabored, CTA. Heart: RRR no s3, s4, soft systolic murmur (bioprosthetic AV). L forearm AVF Abdomen: Soft, non-tender, non-distended, BS + x 4.    Extremities: No clubbing, cyanosis or edema. DP/PT/Radials 2+ and equal bilaterally.  Accessory Clinical Findings  CBC  Recent Labs  05/24/15 0810 05/25/15 0330  WBC 6.1 6.6  HGB 11.7* 11.7*  HCT 36.7* 35.5*  MCV 99.7 99.7  PLT 141* 500*   Basic Metabolic Panel  Recent Labs  05/23/15 1100  NA 137  K 5.5*  CL 99*  CO2 23  GLUCOSE 119*  BUN 59*  CREATININE 9.40*  CALCIUM 9.4  PHOS 8.6*   Cardiac Enzymes  Recent Labs  05/22/15 1512 05/22/15 2116 05/23/15 0132  TROPONINI 1.24* 1.13* 1.09*   TELE NSR without recurrent aflutter    ECG  NSR without significant ST-T wave changes  Echocardiogram 04/14/2015  LV EF: 65% -  70%  ------------------------------------------------------------------- Indications:   427.31 Atrial Fibrillation.  ------------------------------------------------------------------- History:  PMH: AVR /23 mm stented Edwards.  ------------------------------------------------------------------- Study Conclusions  - Left ventricle: Systolic function was vigorous. The estimated ejection fraction was in the range of 65% to 70%. Doppler parameters are consistent with abnormal left ventricular relaxation (grade 1 diastolic dysfunction). Doppler parameters are consistent with elevated ventricular end-diastolic filling pressure. - Aortic valve: There was mild to moderate regurgitation. Mean gradient (  S): 11 mm Hg. Peak gradient (S): 22 mm Hg. Valve area (VTI): 1.8 cm^2. Valve area (Vmax): 1.6 cm^2. Valve area (Vmean): 1.64 cm^2. - Mitral valve: Calcified annulus. Severely thickened, moderately calcified leaflets . The findings are consistent with mild stenosis. There was mild regurgitation. Valve area by pressure half-time: 1.28 cm^2. Valve area by continuity equation (using LVOT flow): 1.23 cm^2. - Left atrium: The atrium was moderately dilated. - Right ventricle: The cavity size was mildly dilated. -  Pulmonary arteries: Systolic pressure was within the normal range. - Inferior vena cava: The vessel was normal in size. The respirophasic diameter changes were in the normal range (= 50%), consistent with normal central venous pressure.  Impressions:  - There are normal gradients across the aortic bioprosthesis. There is mild to moderate aortic regurgitation with excentric jet, possibly paravalvular.     ASSESSMENT AND PLAN  1. Paroxysmal atrial flutter/Fibrillation spontaneously converted in the ED at 12:50 PM - CHA2DS2-VASC score 2 (CAD, HTN), occurs in the setting of dialysis - Toprol XL '50mg'$  BID recently cut back to '25mg'$  BID due to hypotension - Antiarrhythmic therapy limited by structural heart disease and end-stage renal disease. - Holding new start Coumadin secondary to upcoming heart catheterization on Monday. Also holding heparin given minor bleeding around fistula site. Continue on plavix. Discussed with nephrology team, he has recurrent hypotension during dialysis making rate control therapy difficult.  -For now continue metoprolol 25 mg po BID as the patient wants to think about starting amiodarone. Right now, we will continue with metoprolol. This might be his only option for optimal rate control. His left atrial size is 51 mm.  2. Abnormal nuclear stress test-associated jaw pain but no chest pain - Diffuse ST depression noted during tachycardia. ST depression resolved after convert to NSR  - serial trop trended up from POC 0.03 to trop I 0.58 --> 1.37 --> 1.54--> 1.09  - Nuclear stress test demonstrated EF of 22% and apical ischemia. Plan is for left heart catheterization Monday. (65-70% on echo on 04/14/15). If INR is greater than 1.8, we Zacariah Belue need to hold his catheterization until less than this value. He understands this.  3. Hypotension with h/o HTN  - nephrology increased EDW by 1kg to 81kg. Will avoid  over aggressive dialysis next time.   4. CAD s/p CABG in Aug 2014 with SVG to LAD, we will evaluate with heart catheterization Monday.  5. Aortic valve replacement with Edwards 23 mm bioprosthetic valve during CABG in August 2014  6. 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 Dr. Abel Presto note reviewed.  7. multiple sclerosis   8. obstructive sleep apnea not compliant on BiPAP   SKAINS, MARK 05/25/2015

## 2015-05-25 NOTE — Progress Notes (Signed)
ANTICOAGULATION CONSULT NOTE - Follow Up Consult  Pharmacy Consult for Heparin and Coumadin Indication: chest pain/ACS and atrial fibrillation  Allergies  Allergen Reactions  . Diphenhydramine Itching    Only with IV doses. Tolerates oral.  . Penicillins Other (See Comments)    migraine  . Adhesive [Tape] Rash    Please use paper tape    Patient Measurements: Height: '5\' 10"'$  (177.8 cm) Weight: 184 lb 8 oz (83.689 kg) IBW/kg (Calculated) : 73   Vital Signs: Temp: 98 F (36.7 C) (09/18 0500) Temp Source: Oral (09/18 0500) BP: 146/73 mmHg (09/18 0500) Pulse Rate: 58 (09/18 0500)  Labs:  Recent Labs  05/22/15 1512 05/22/15 2116  05/23/15 0132 05/23/15 1100 05/24/15 0810 05/25/15 0330  HGB  --   --   < > 12.2* 12.1* 11.7* 11.7*  HCT  --   --   < > 37.3* 37.3* 36.7* 35.5*  PLT  --   --   < > 159 156 141* 133*  LABPROT  --   --   --  16.6*  --  20.7* 24.3*  INR  --   --   --  1.33  --  1.78* 2.20*  HEPARINUNFRC  --   --   --  0.32  --  0.45 <0.10*  CREATININE  --   --   --   --  9.40*  --   --   TROPONINI 1.24* 1.13*  --  1.09*  --   --   --   < > = values in this interval not displayed.  Estimated Creatinine Clearance: 8.6 mL/min (by C-G formula based on Cr of 9.4).   Medications:  Scheduled:  . atorvastatin  40 mg Oral QHS  . calcium acetate  4,002 mg Oral TID WC  . citalopram  20 mg Oral Daily  . clopidogrel  75 mg Oral Q breakfast  . docusate sodium  400 mg Oral BID  . famotidine  20 mg Oral Daily  . folic acid  1 mg Oral Daily  . gabapentin  300 mg Oral QHS  . metoprolol tartrate  25 mg Oral BID  . multivitamin  1 tablet Oral Daily  . sodium chloride  250 mL Intravenous Once   Infusions:   PRN: acetaminophen, acetaminophen, albuterol, fluticasone, nitroGLYCERIN, ondansetron (ZOFRAN) IV  Assessment: 59 YOM to begin IV heparin and warfarin for PAF, noted spontaneously converted. Pt reports taking warfarin for a short time years ago but cannot recall  reason. On Plavix 75 mg and d/c ASA per cardiology. CHA2DS2-VASC = 2. On 9/17 pt was bleeding from fistula.  Discussed with cardiology MD and will hold Heparin for now.  MD will also hold coumadin 2/2 upcoming heart cath on Monday. Today per nursing pt has no s/sx of bleeding.  Per cardiology will continue to hold heparin. MD DID NOT want to start heparin when INR <2.    INR now therapeutic 1.21>1.26> 1.33>1.78>2.20 , Hgb stable 11.7, Plts 133 HL<0.10 after d/c heparin for fistula bleeding.    Goal of Therapy:  INR 2-3 Heparin level 0.3-0.7 units/ml Monitor platelets by anticoagulation protocol: Yes   Plan:  Hold Heparin   Hold warfarin for upcoming heart cath on Monday.  Daily HL/CBC/INR Monitor for s/sx of bleeding  Bennye Alm, PharmD Pharmacy Resident [phone]

## 2015-05-25 NOTE — Progress Notes (Signed)
Assessment: 1 A flutter, Afib, now NSR, pos troponin; for cath Monday(heparin held after AV access bleeding) 2 ESRD 3 Dialysis associated hypotension  Plan: Dialysis post cath Monday with new increased EDW.  Subjective: Interval History: none.  Objective: Vital signs in last 24 hours: Temp:  [98 F (36.7 C)-98.5 F (36.9 C)] 98 F (36.7 C) (09/18 0500) Pulse Rate:  [55-58] 55 (09/18 0946) Resp:  [18-20] 20 (09/17 2053) BP: (146-183)/(73-87) 168/87 mmHg (09/18 0946) SpO2:  [95 %-96 %] 95 % (09/18 0500) Weight:  [83.689 kg (184 lb 8 oz)] 83.689 kg (184 lb 8 oz) (09/18 0500) Weight change: -0.011 kg (-0.4 oz)  Intake/Output from previous day: 09/17 0701 - 09/18 0700 In: 120 [P.O.:120] Out: -  Intake/Output this shift:    General appearance: alert and cooperative Resp: clear to auscultation bilaterally Chest wall: no tenderness Cardio: regular rate and rhythm, S1, S2 normal, no murmur, click, rub or gallop Extremities: extremities normal, atraumatic, no cyanosis or edema AV access Ok  Lab Results:  Recent Labs  05/24/15 0810 05/25/15 0330  WBC 6.1 6.6  HGB 11.7* 11.7*  HCT 36.7* 35.5*  PLT 141* 133*   BMET:  Recent Labs  05/23/15 1100  NA 137  K 5.5*  CL 99*  CO2 23  GLUCOSE 119*  BUN 59*  CREATININE 9.40*  CALCIUM 9.4   No results for input(s): PTH in the last 72 hours. Iron Studies: No results for input(s): IRON, TIBC, TRANSFERRIN, FERRITIN in the last 72 hours. Studies/Results: Nm Myocar Multi W/spect W/wall Motion / Ef  05/23/2015    There was no ST segment deviation noted during stress.  Findings consistent with ischemia and prior myocardial infarction.  This is a high risk study.  The left ventricular ejection fraction is severely decreased (<30%).   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  Apical ischemia EF 22%    Scheduled: . atorvastatin  40 mg Oral QHS  . calcium acetate  4,002 mg Oral TID WC  . citalopram  20 mg Oral Daily  .  clopidogrel  75 mg Oral Q breakfast  . docusate sodium  400 mg Oral BID  . famotidine  20 mg Oral Daily  . folic acid  1 mg Oral Daily  . gabapentin  300 mg Oral QHS  . metoprolol tartrate  25 mg Oral BID  . multivitamin  1 tablet Oral Daily  . sodium chloride  250 mL Intravenous Once    LOS: 2 days   Shaquayla Klimas C 05/25/2015,11:43 AM

## 2015-05-26 ENCOUNTER — Encounter (HOSPITAL_COMMUNITY): Payer: Self-pay | Admitting: Cardiology

## 2015-05-26 ENCOUNTER — Encounter (HOSPITAL_COMMUNITY)
Admission: EM | Disposition: A | Discharge status: Home / Self Care | Payer: Self-pay | Source: Home / Self Care | Attending: Cardiology

## 2015-05-26 DIAGNOSIS — R9439 Abnormal result of other cardiovascular function study: Secondary | ICD-10-CM

## 2015-05-26 DIAGNOSIS — I2510 Atherosclerotic heart disease of native coronary artery without angina pectoris: Secondary | ICD-10-CM | POA: Insufficient documentation

## 2015-05-26 DIAGNOSIS — Z9861 Coronary angioplasty status: Secondary | ICD-10-CM

## 2015-05-26 DIAGNOSIS — I4891 Unspecified atrial fibrillation: Secondary | ICD-10-CM | POA: Insufficient documentation

## 2015-05-26 HISTORY — PX: CARDIAC CATHETERIZATION: SHX172

## 2015-05-26 LAB — CBC
HCT: 36.2 % — ABNORMAL LOW (ref 39.0–52.0)
HEMATOCRIT: 36.2 % — AB (ref 39.0–52.0)
HEMOGLOBIN: 11.8 g/dL — AB (ref 13.0–17.0)
Hemoglobin: 11.7 g/dL — ABNORMAL LOW (ref 13.0–17.0)
MCH: 31.9 pg (ref 26.0–34.0)
MCH: 32.1 pg (ref 26.0–34.0)
MCHC: 32.3 g/dL (ref 30.0–36.0)
MCHC: 32.6 g/dL (ref 30.0–36.0)
MCV: 98.6 fL (ref 78.0–100.0)
MCV: 98.4 fL (ref 78.0–100.0)
PLATELETS: 136 10*3/uL — AB (ref 150–400)
Platelets: 122 10*3/uL — ABNORMAL LOW (ref 150–400)
RBC: 3.67 MIL/uL — AB (ref 4.22–5.81)
RBC: 3.68 MIL/uL — ABNORMAL LOW (ref 4.22–5.81)
RDW: 14.4 % (ref 11.5–15.5)
RDW: 14.3 % (ref 11.5–15.5)
WBC: 7 10*3/uL (ref 4.0–10.5)
WBC: 5.9 10*3/uL (ref 4.0–10.5)

## 2015-05-26 LAB — BASIC METABOLIC PANEL
ANION GAP: 13 (ref 5–15)
BUN: 65 mg/dL — AB (ref 6–20)
CALCIUM: 10.7 mg/dL — AB (ref 8.9–10.3)
CO2: 26 mmol/L (ref 22–32)
Chloride: 98 mmol/L — ABNORMAL LOW (ref 101–111)
Creatinine, Ser: 10.3 mg/dL — ABNORMAL HIGH (ref 0.61–1.24)
GFR calc Af Amer: 6 mL/min — ABNORMAL LOW (ref >60)
GFR, EST NON AFRICAN AMERICAN: 5 mL/min — AB (ref >60)
GLUCOSE: 91 mg/dL (ref 65–99)
Potassium: 6.1 mmol/L (ref 3.5–5.1)
Sodium: 137 mmol/L (ref 135–145)

## 2015-05-26 LAB — RENAL FUNCTION PANEL
ALBUMIN: 3.1 g/dL — AB (ref 3.5–5.0)
ANION GAP: 7 (ref 5–15)
BUN: 22 mg/dL — ABNORMAL HIGH (ref 6–20)
CHLORIDE: 98 mmol/L — AB (ref 101–111)
CO2: 31 mmol/L (ref 22–32)
Calcium: 9.2 mg/dL (ref 8.9–10.3)
Creatinine, Ser: 5.49 mg/dL — ABNORMAL HIGH (ref 0.61–1.24)
GFR calc Af Amer: 12 mL/min — ABNORMAL LOW (ref >60)
GFR calc non Af Amer: 10 mL/min — ABNORMAL LOW (ref >60)
GLUCOSE: 86 mg/dL (ref 65–99)
PHOSPHORUS: 3.9 mg/dL (ref 2.5–4.6)
POTASSIUM: 4.6 mmol/L (ref 3.5–5.1)
Sodium: 136 mmol/L (ref 135–145)

## 2015-05-26 LAB — POCT ACTIVATED CLOTTING TIME
ACTIVATED CLOTTING TIME: 331 s
ACTIVATED CLOTTING TIME: 190 s
ACTIVATED CLOTTING TIME: 202 s
Activated Clotting Time: 288 seconds
Activated Clotting Time: 220 seconds

## 2015-05-26 LAB — PROTIME-INR
INR: 1.6 — ABNORMAL HIGH (ref 0.00–1.49)
Prothrombin Time: 19.1 seconds — ABNORMAL HIGH (ref 11.6–15.2)

## 2015-05-26 LAB — POTASSIUM: Potassium: 3.9 mmol/L (ref 3.5–5.1)

## 2015-05-26 SURGERY — LEFT HEART CATH AND CORONARY ANGIOGRAPHY
Anesthesia: LOCAL

## 2015-05-26 MED ORDER — ADENOSINE 12 MG/4ML IV SOLN
16.0000 mL | Freq: Once | INTRAVENOUS | Status: DC
  Filled 2015-05-26: qty 16

## 2015-05-26 MED ORDER — SODIUM CHLORIDE 0.9 % IJ SOLN
3.0000 mL | Freq: Two times a day (BID) | INTRAMUSCULAR | Status: DC
  Administered 2015-05-26: 3 mL via INTRAVENOUS

## 2015-05-26 MED ORDER — SODIUM CHLORIDE 0.9 % IV SOLN: 250.0000 mL | INTRAVENOUS | Status: DC | PRN

## 2015-05-26 MED ORDER — FENTANYL CITRATE (PF) 100 MCG/2ML IJ SOLN
INTRAMUSCULAR | Status: AC
  Filled 2015-05-26: qty 4

## 2015-05-26 MED ORDER — WARFARIN - PHARMACIST DOSING INPATIENT: Freq: Every day | Status: DC

## 2015-05-26 MED ORDER — HEPARIN SODIUM (PORCINE) 1000 UNIT/ML IJ SOLN
INTRAMUSCULAR | Status: AC
  Filled 2015-05-26: qty 1

## 2015-05-26 MED ORDER — ALTEPLASE 2 MG IJ SOLR
2.0000 mg | Freq: Once | INTRAMUSCULAR | Status: DC | PRN
  Filled 2015-05-26: qty 2

## 2015-05-26 MED ORDER — HEPARIN (PORCINE) IN NACL 2-0.9 UNIT/ML-% IJ SOLN
INTRAMUSCULAR | Status: AC
  Filled 2015-05-26: qty 1000

## 2015-05-26 MED ORDER — HEPARIN SODIUM (PORCINE) 1000 UNIT/ML DIALYSIS
20.0000 [IU]/kg | INTRAMUSCULAR | Status: DC | PRN
  Filled 2015-05-26: qty 2

## 2015-05-26 MED ORDER — IOHEXOL 350 MG/ML SOLN
INTRAVENOUS | Status: DC | PRN
  Administered 2015-05-26: 135 mL via INTRA_ARTERIAL

## 2015-05-26 MED ORDER — DEXTROSE 50 % IV SOLN: 25.0000 g | Freq: Once | INTRAVENOUS | Status: DC

## 2015-05-26 MED ORDER — PENTAFLUOROPROP-TETRAFLUOROETH EX AERO: 1.0000 "application " | INHALATION_SPRAY | CUTANEOUS | Status: DC | PRN

## 2015-05-26 MED ORDER — LIDOCAINE HCL (PF) 1 % IJ SOLN
INTRAMUSCULAR | Status: AC
  Filled 2015-05-26: qty 30

## 2015-05-26 MED ORDER — LIDOCAINE HCL (PF) 1 % IJ SOLN
INTRAMUSCULAR | Status: DC | PRN
  Administered 2015-05-26: 16:00:00

## 2015-05-26 MED ORDER — SODIUM CHLORIDE 0.9 % IJ SOLN: 3.0000 mL | INTRAMUSCULAR | Status: DC | PRN

## 2015-05-26 MED ORDER — SODIUM CHLORIDE 0.9 % IV SOLN: 62.5000 mg | INTRAVENOUS | Status: DC

## 2015-05-26 MED ORDER — NITROGLYCERIN 1 MG/10 ML FOR IR/CATH LAB
INTRA_ARTERIAL | Status: AC
  Filled 2015-05-26: qty 10

## 2015-05-26 MED ORDER — FENTANYL CITRATE (PF) 100 MCG/2ML IJ SOLN
INTRAMUSCULAR | Status: DC | PRN
  Administered 2015-05-26 (×2): 25 ug via INTRAVENOUS

## 2015-05-26 MED ORDER — MIDAZOLAM HCL 2 MG/2ML IJ SOLN
INTRAMUSCULAR | Status: AC
  Filled 2015-05-26: qty 4

## 2015-05-26 MED ORDER — HEPARIN SODIUM (PORCINE) 1000 UNIT/ML DIALYSIS
1000.0000 [IU] | INTRAMUSCULAR | Status: DC | PRN
  Filled 2015-05-26: qty 1

## 2015-05-26 MED ORDER — LIDOCAINE HCL (PF) 1 % IJ SOLN
5.0000 mL | INTRAMUSCULAR | Status: DC | PRN
Start: 2015-05-26 — End: 2015-05-27

## 2015-05-26 MED ORDER — HYDRALAZINE HCL 20 MG/ML IJ SOLN
INTRAMUSCULAR | Status: DC | PRN
  Administered 2015-05-26: 10 mg via INTRAVENOUS

## 2015-05-26 MED ORDER — SODIUM POLYSTYRENE SULFONATE 15 GM/60ML PO SUSP: 30.0000 g | Freq: Once | ORAL | Status: DC

## 2015-05-26 MED ORDER — ASPIRIN 81 MG PO CHEW
81.0000 mg | CHEWABLE_TABLET | ORAL | Status: AC
Start: 2015-05-27 — End: 2015-05-26
  Administered 2015-05-26: 81 mg via ORAL
  Filled 2015-05-26: qty 1

## 2015-05-26 MED ORDER — SODIUM CHLORIDE 0.9 % IV SOLN: INTRAVENOUS | Status: DC

## 2015-05-26 MED ORDER — INSULIN ASPART 100 UNIT/ML IV SOLN: 10.0000 [IU] | Freq: Once | INTRAVENOUS | Status: DC

## 2015-05-26 MED ORDER — MIDAZOLAM HCL 2 MG/2ML IJ SOLN
INTRAMUSCULAR | Status: DC | PRN
  Administered 2015-05-26 (×2): 1 mg via INTRAVENOUS

## 2015-05-26 MED ORDER — SODIUM CHLORIDE 0.9 % IV SOLN: 100.0000 mL | INTRAVENOUS | Status: DC | PRN

## 2015-05-26 MED ORDER — HEPARIN (PORCINE) IN NACL 100-0.45 UNIT/ML-% IJ SOLN
1500.0000 [IU]/h | INTRAMUSCULAR | Status: DC
  Administered 2015-05-27: 1250 [IU]/h via INTRAVENOUS
  Filled 2015-05-26 (×2): qty 250

## 2015-05-26 MED ORDER — WARFARIN SODIUM 7.5 MG PO TABS
7.5000 mg | ORAL_TABLET | Freq: Once | ORAL | Status: AC
  Administered 2015-05-26: 7.5 mg via ORAL
  Filled 2015-05-26: qty 1

## 2015-05-26 MED ORDER — ADENOSINE (DIAGNOSTIC) 140MCG/KG/MIN
INTRAVENOUS | Status: DC | PRN
  Administered 2015-05-26: 137.725 ug/kg/min via INTRAVENOUS

## 2015-05-26 MED ORDER — HYDRALAZINE HCL 20 MG/ML IJ SOLN
INTRAMUSCULAR | Status: AC
  Filled 2015-05-26: qty 1

## 2015-05-26 MED ORDER — LIDOCAINE-PRILOCAINE 2.5-2.5 % EX CREA
1.0000 | TOPICAL_CREAM | CUTANEOUS | Status: DC | PRN
Start: 2015-05-26 — End: 2015-05-27

## 2015-05-26 MED ORDER — SODIUM CHLORIDE 0.9 % IJ SOLN: 3.0000 mL | Freq: Two times a day (BID) | INTRAMUSCULAR | Status: DC

## 2015-05-26 MED ORDER — HEPARIN SODIUM (PORCINE) 1000 UNIT/ML IJ SOLN
INTRAMUSCULAR | Status: DC | PRN
  Administered 2015-05-26: 6500 [IU] via INTRAVENOUS
  Administered 2015-05-26: 1500 [IU] via INTRAVENOUS

## 2015-05-26 SURGICAL SUPPLY — 13 items
CATH INFINITI 5FR MULTPACK ANG (CATHETERS) ×1 IMPLANT
GUIDE CATH RUNWAY 6FR AL 1 (CATHETERS) ×1 IMPLANT
GUIDEWIRE PRESSURE COMET II (WIRE) ×1 IMPLANT
KIT ESSENTIALS PG (KITS) ×1 IMPLANT
KIT HEART LEFT (KITS) ×2 IMPLANT
PACK CARDIAC CATHETERIZATION (CUSTOM PROCEDURE TRAY) ×2 IMPLANT
SHEATH PINNACLE 6F 10CM (SHEATH) ×1 IMPLANT
SHEATH PINNACLE ST 6F 45CM (SHEATH) ×1 IMPLANT
SYR MEDRAD MARK V 150ML (SYRINGE) ×2 IMPLANT
TRANSDUCER W/STOPCOCK (MISCELLANEOUS) ×2 IMPLANT
TUBING CIL FLEX 10 FLL-RA (TUBING) ×1 IMPLANT
WIRE EMERALD 3MM-J .035X150CM (WIRE) ×1 IMPLANT
WIRE EMERALD ST .035X150CM (WIRE) ×1 IMPLANT

## 2015-05-26 NOTE — Progress Notes (Signed)
I have reviewed the patient's chart - cardiac cath scheduled for today was delayed due to elevated K (~6.0).  His Cardiac Cath /PCI report from [facility] was reviewed on Care Everywhere: I have copied & pasted this report as it has significant bearing for his planned cardiac catheterization.  Diagnostic and Interventional Cardiac Catheterization Report Procedure Date: 01/09/2015 Patient Name: [name] MRN: [id number] Account No: [id number] Patient Status: DOB: 04/27/1956 Age/Sex: 59/Male  Diagnostic Physician: Alison Stalling Interventional Physician: Alison Stalling Diagnostic Fellow: End, Harrell Gave Interventional Fellow: Lyda Jester Telford Nab  Referring Physician: Manus Gunning Primary Cardiologist: Marca Ancona PROCEDURES PERFORMED: BYPASS GRAFT ANGIOGRAPHY CORONARY ANGIOGRAPHY Coronary Bare Metal Stent Coronary Drug Eluting Stent  PRE-PROCEDURAL DIAGNOSIS: Non ST Elevated MI  HISTORY: 59 year old man with history of coronary artery disease status post CABG in 2014 (SVG Y graft to mid LAD and D2), 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  He reports having left-sided chest pain and shortness of breath for 3 days prior to his presentation, which began shortly after hemodialysis. The pain has improved somewhat over that time, he has experienced progressive dyspnea that prompted him to seek care at the outside hospital.  There, he was noted to have a rising troponin that peaked at 20. He was therefore transferred to [facility] for further evaluation, as his cardiology and kidney care has been performed here. At this time, he is without chest pain. He has only mild dyspnea at this time. EKG demonstrates atrial fibrillation with rapid ventricular response and nonspecific ST segment changes.   Of note, the patient underwent myocardial perfusion stress test last week as part of a kidney transplant evaluation, which revealed a moderately size, nearlycompletely reversible defect in the inferolateral territory. Based on these findings and his symptoms, the patient has been referred for left heart catheterization with possible percutaneous coronary intervention.  PROCEDURE TECHNIQUE: Using 2% lidocaine for local anesthesia and micropuncture technique, a 6 French Terumo sheath was placed in the left common femoral artery. Because of significant calcification and tortuousity of the iliac system, the sheath was exchanged for a 6 French x 45 cm Destination sheath over a 0.035 Wholey wire. Selective left and right coronary angiography was then performed by advancing 6 [place] JL4 and JR4 diagnostic catheters, respectively, over a 0.35 J wire. Bypass graft angiography was performed in similar fashion with a diagnostic JR4.  Following coronary angiography, and attempt was made to cross the aortic valve with a JR4 catheter, but this was not successful. The patient was given 6000 units IV heparin and intermittent boluses throughout the case to maintain ACT > 250 seconds.   A 6 [place] guide catheter was advanced over a wire and used to cannulate the saphenous vein graft. The lesion was crossed with a 0.014 Runhthrough wire, which was advanced into the distal left anterior descending. The lesion was predilated with a 2.5 x 12 mm Euphora balllon to 12 atm for 30 seconds, then with a 3.5 x 15 mm Euphora balloon to 10 atm for 30 seconds. A 6.0 x 15 mm Herculink bare metal stent was delivered to the lesion. Position was confirmed via angiography, and the stent was deployed to 14 atm for 20 seconds, then post dilated with a 6.0 x 15 mm Aviator balllon to 12 atm for 40 seconds. Final angiography revealed jailing of the diagonal sequential  graft but no significant residual stenosis and TIMI III flow into both the  left anterior descending and diagonal.  The guide catheter was then exchanged for a 6 French EBU 4.0 guide, which was used to cannulate the left main. The same Runthrough wire was used to cross the circumflex lesion and advanced into a distal obtuse marginal branch. The lesion was predilated with a 2.25 x 12 mm Euphora balloon to 12 atm for 30 seconds. A 2.75 x 26 mm Resolute Integrity drug eluting stent was delivered to the lesion.  Position was confirmed angiographically, and the stent was deployed to 12 atm for 55 seconds, then post dilated with a [place] Euphora 3.0 x 15 mm balloon to 20 atm for 45 seconds. Final angiography revealed TIMI III flow into the distal vessel with no residual stenosis.  Following the procedure, the destination sheath was exchanged over a wire for a 7 [place] Terumo sheath. The patient began to develop a hematoma, so the sheath was removed and manual pressure held to obtain hemostasis. The patient tolerated the procedure well with only a small hematoma and no further apparent complications.  Fluoro Time (diagnostic and intervention): 30.2 minutes Contrast: Omnipaque Total Contrast (diagnostic and intervention): 255 ml  Closure Device Type: Manual Compression  HEMODYNAMICS:  Pre-PCI Ejection Fraction: 55%  Hemodynamic Summary:  State: AIR REST  State: OXYGEN REST  CORONARY ANGIOGRAPHY  The left main coronary artery (LMCA) is a large-caliber vessel arising from the left coronary sinus. The LMCA bifurcates into the left anterior descending (LAD) and left circumflex (LCx) arteries.  There are mild luminal irregularities in the LMCA.  The LAD is a large-caliber vessel that gives rise to three diagonal (D) branches before wrapping around the apex. There is diffuse disease of the proximal LAD up to 30%. The mid LAD is occluded just beyond the origin of D1. The mid/distal LAD fills via a branch of the SVG Y graft. D1 is a medium to large-caliber branch without significant  disease. D2 is a large-caliber branch that is subtotally occluded at its ostium and fills via a branch of the SVG Y graft. D3 is a small branch.  The LCx is a large-caliber vessel that gives rise to three obtuse marginal (OM) branches and then terminates in the AV groove. There is a 99% stenosis in the mid LCx at the origin of OM2. Following PCI, there is 0% residual stenosis and TIMI-3 flow. OM1 is a small branch. OM2 is a medium-caliber branch. OM3 is a medium to large- caliber branch.  The right coronary artery is a large-caliber vessel arising from the right coronary cusp. The distal RCA gives rise to a large-caliber posterior descending artery and several medium to small-caliber posterolateral (PL) branches, consistent with a right-dominant system. There is diffuse disease of the mid RCA up to 50%.   Device(s) used for intervention: Vessel Type Name Diameter Length Segment Proximal Drug Eluting Resolute Integrity 2.84m x  26 mm -- Circumfl Stent Rx   Cardiogenic Shock: No Grafts SVG Y-graft to LAD and D2: 95% stenosis in branch to LAD. Following PCI, there is 0% residual stenosis with TIMI 3 flow. Branch to D2 is widely patent though jailed by stent to the LAD branch of the graft.   COMPLICATIONS: Hematoma - Small  SUMMARY FINDINGS: Two Vessel CAD Successful PCI  Significant native coronary artery disease (as detailed above), including occluded mid LAD, 99% mid LCx stenosis, and 50% mid RCA disease. Significant disease of the saphenous vein graft  to the left anterior descending. Successful PCI to the saphenous vein graft to the left anterior descending with bare metal stent. Successful PCI to the left circumflex with drug eluting stent.  PLAN/DISPOSITION AND FOLLOWUP: Return to telemetry for recovery with careful monitoring of access site. Continue lifelong aspirin therapy. Continue plavix for at least one year. These findings and recommendations were discussed with the patient  and referring physician.   The current History and Physical was reviewed and no changes were noted within 24 hours. 01/09/2015 07:54 AM I was present during the entire procedure electronically signed on 02/06/2015 6:59:57

## 2015-05-26 NOTE — H&P (View-Only) (Signed)
Patient Name: [name] Date of Encounter: 05/26/2015  Active Problems:   CKD (chronic kidney disease) stage V requiring chronic dialysis   Atrial flutter with rapid ventricular response   Hypertension   Abnormal stress test     Primary Cardiologist: Dr. Angelena Form Dr. Delight Hoh [facility]) Patient Profile: 59 yo male w/PMH of CAD (s/p CABG in Aug 2014 with SVG to LAD), aortic valve replacement with Edwards 23 mm bioprosthetic valve during CABG in August 2014, PAF, ESRD (w/renal transplant with subsequent rejection on HD MWF), multiple sclerosis and OSA (not compliant on BiPAP) presented with aflutter with RVR during HD on 05/21/2015.  SUBJECTIVE: Feeling well. Denies any chest pain or palpitations. Reports some orthopnea. Underwent HD this morning. LHC planned for this AM but delayed due to hyperkalemia.  OBJECTIVE Filed Vitals:   05/26/15 1007 05/26/15 1037 05/26/15 1045 05/26/15 1143  BP: 95/64 97/70 108/70 125/78  Pulse: 75 80 75 68  Temp:   97.7 F (36.5 C) 98.5 F (36.9 C)  TempSrc:   Oral Oral  Resp: '18 18 18 20  '$ Height:      Weight:   184 lb 1.4 oz (83.5 kg)   SpO2:   91% 95%    Intake/Output Summary (Last 24 hours) at 05/26/15 1224 Last data filed at 05/26/15 1045  Gross per 24 hour  Intake      0 ml  Output   2525 ml  Net  -2525 ml   Filed Weights   05/25/15 0500 05/26/15 0706 05/26/15 1045  Weight: 184 lb 8 oz (83.689 kg) 188 lb 15 oz (85.7 kg) 184 lb 1.4 oz (83.5 kg)    PHYSICAL EXAM General: Well developed, well nourished, male in no acute distress. Head: Normocephalic, atraumatic.  Neck: Supple without bruits, JVD not elevated. Lungs:  Resp regular and unlabored, CTA. Heart: RRR, S1, S2, no S3, S4, or murmur; no rub. Abdomen: Soft, non-tender, non-distended with normoactive bowel sounds. No hepatomegaly. No rebound/guarding. No obvious abdominal masses. Extremities: No clubbing, cyanosis, or edema. Distal pedal pulses are 2+ bilaterally. Left  forearm fistula noted. Neuro: Alert and oriented X 3. Moves all extremities spontaneously. Psych: Normal affect.  LABS: CBC:  Recent Labs  05/25/15 0330 05/26/15 0318  WBC 6.6 7.0  HGB 11.7* 11.7*  HCT 35.5* 36.2*  MCV 99.7 98.6  PLT 133* 136*   INR:  Recent Labs  05/26/15 0318  INR 9.92*   Basic Metabolic Panel:  Recent Labs  05/26/15 0449 05/26/15 1100  NA 137  --   K 6.1* 3.9  CL 98*  --   CO2 26  --   GLUCOSE 91  --   BUN 65*  --   CREATININE 10.30*  --   CALCIUM 10.7*  --   BNP:  B NATRIURETIC PEPTIDE  Date/Time Value Ref Range Status  02/21/2015 01:15 PM 2505.1* 0.0 - 100.0 pg/mL Final  01/08/2015 10:17 PM 1085.0* 0.0 - 100.0 pg/mL Final   TELE: Normal sinus rhythm with rate in 60's - 80's with frequent PVC's.     Current Medications:  . [START ON 05/27/2015] aspirin  81 mg Oral Pre-Cath  . atorvastatin  40 mg Oral QHS  . calcium acetate  4,002 mg Oral TID WC  . citalopram  20 mg Oral Daily  . clopidogrel  75 mg Oral Q breakfast  . dextrose  25 g Intravenous Once  . docusate sodium  400 mg Oral BID  . famotidine  20 mg Oral Daily  . [  START ON 05/28/2015] ferric gluconate (FERRLECIT/NULECIT) IV  62.5 mg Intravenous Q Wed-HD  . folic acid  1 mg Oral Daily  . gabapentin  300 mg Oral QHS  . insulin aspart  10 Units Intravenous Once  . metoprolol tartrate  25 mg Oral BID  . multivitamin  1 tablet Oral Daily  . sodium chloride  250 mL Intravenous Once  . sodium chloride  3 mL Intravenous Q12H   . sodium chloride      ASSESSMENT AND PLAN: 1. Paroxysmal atrial flutter/Fibrillation  - spontaneously converted in the ED to NSR on 05/21/2015 - CHA2DS2-VASC score 2 (CAD, HTN), occurs in the setting of dialysis - Toprol XL '50mg'$  BID recently cut back to '25mg'$  BID due to hypotension - New start Coumadin has been held since 05/24/2015 secondary to upcoming heart catheterization on 05/26/2015.   2. Abnormal nuclear stress test-associated jaw pain  but no chest pain - Diffuse ST depression noted during tachycardia. ST depression resolved after convert to NSR. - serial trop trended up from POC 0.03 to trop I 0.58 --> 1.37 --> 1.54--> 1.09 - Nuclear stress test demonstrated EF of 22% and apical ischemia on 05/23/2015. Plan is for left heart catheterization today. (65-70% on echo on 04/14/15).   3. Hypotension with h/o HTN - nephrology increased EDW by 1kg to 81kg. Will avoid over aggressive dialysis next time.   4. CAD s/p CABG - Aug 2014 with SVG to LAD, we will evaluate with heart catheterization Monday.  5. Aortic valve replacement  - with Edwards 23 mm bioprosthetic valve during CABG in August 2014  6. ESRD  - with renal transplant in 02/2012 with subsequent rejection - HD MWF  7. Multiple Sclerosis   8. OSA  - not compliant on BiPAP  9. Hyperkalemia - Potassium elevated to 6.1 on 05/26/2015. Resolved to 3.9 following HD.  Arna Medici , PA-C 12:24 PM 05/26/2015 Pager: [phone]  I have seen and examined the patient along with Erma Heritage , PA-C.  I have reviewed the chart, notes and new data.  I agree with PA's note.  PLAN: Cardiac cath +/- PCI today. This procedure has been fully reviewed with the patient and written informed consent has been obtained.   Sanda Klein, MD, [place] [phone] 05/26/2015, 1:16 PM

## 2015-05-26 NOTE — Care Management Note (Addendum)
Case Management Note  Patient Details  Name: [name] MRN: [id number] Date of Birth: 1956/09/02  Subjective/Objective:  Pt admitted for A Flutter RVR. Hx ESRD on HD. Cath planned for 05-26-15                  Action/Plan: No needs identified by CM at this time. Will continue to monitor.    Expected Discharge Date:                  Expected Discharge Plan:  Home/Self Care  In-House Referral:     Discharge planning Services  CM Consult  Post Acute Care Choice:    Choice offered to:     DME Arranged:    DME Agency:     HH Arranged:    HH Agency:     Status of Service:  In process, will continue to follow  Medicare Important Message Given:    Date Medicare IM Given:    Medicare IM give by:    Date Additional Medicare IM Given:    Additional Medicare Important Message give by:     If discussed at [place] of Stay Meetings, dates discussed:    Additional Comments: Pt planned for d/c no needs identified.   Bethena Roys, RN 05/26/2015, 3:47 PM

## 2015-05-26 NOTE — Interval H&P Note (Signed)
History and Physical Interval Note:  05/26/2015 2:35 PM  [name]  has presented today for surgery, with the diagnosis of abnormal stress test, known CAD-CABG & PCI with Possible Ischemic CM - EF ~20-25% on Myoview.  The various methods of treatment have been discussed with the patient and family. After consideration of risks, benefits and other options for treatment, the patient has consented to  Procedure(s): Left Heart Cath and Coronary Angiography (N/A) With Possible Percutaneous Coronary Intervention as a surgical intervention .    The patient's history has been reviewed, patient examined, no change in status, stable for surgery.  I have reviewed the patient's chart and labs.  Questions were answered to the patient's satisfaction.    Cath Lab Visit (complete for each Cath Lab visit)  Clinical Evaluation Leading to the Procedure:   ACS: No.  Non-ACS:    Anginal Classification: CCS III  Anti-ischemic medical therapy: Minimal Therapy (1 class of medications)  Non-Invasive Test Results: High-risk stress test findings: cardiac mortality >3%/year  Prior CABG: Previous CABG   AUC for Cath:  CAD Assessment (Coronary Angiography With or Without Left Heart Catheterization and/or Left Ventriculography)  Patient Information:    Patients With Known Obstructive CAD (e.g., Prior MI, Prior PCI, Prior CABG, or Obstructive Disease on Invasive Angiography)   Post Revascularization (PCI or CABG)   High-risk noninvasive findings   Worsening or limiting symptoms  AUC Score:   A (8)   Indication:   55  AUC for PCI: Ischemic Symptoms? CCS III (Marked limitation of ordinary activity) Anti-ischemic Medical Therapy? Minimal Therapy (1 class of medications) Non-invasive Test Results? High-risk stress test findings: cardiac mortality >3%/yr Prior CABG? Previous CABG   Patient Information:   >=1 SVG stenosis  A (7)  Indication: 54; Score: 7   Patient Information:   All bypass  grafts patent, >=1 lesion(s) in native coronaries without bypass grafts  A (7)  Indication: 54; Score: 7   Patient Information:   Native 3V-CAD, failure of multiple grafts, depressed LVEF, patent LIMA graft PCI  U (6)  Indication: 68; Score: 6   Patient Information:   Native 3V-CAD, failure of multiple grafts, depressed LVEF, patent LIMA graft CABG  A (7)  Indication: 68; Score: 7   Patient Information:   Native 3V-CAD, failure of multiple grafts, depressed LVEF, nonfunctional LIMA graft PCI  A (8)  Indication: 69; Score: 8   Patient Information:   Native 3V-CAD, failure of multiple grafts, depressed LVEF, nonfunctional LIMA graft CABG  U (6)  Indication: 69; Score: 6   HARDING, DAVID W

## 2015-05-26 NOTE — Progress Notes (Signed)
[facility] Progress Note  Assessment/Plan: 1. Afib/flutter- holding new start to coumadin due to heart cath today; BB dose reduced due to low BP 2. ESRD - MWF - K 6.1 - ordered kayexalate 30 this am- but said he didn't take - will d/c, now on HD- normally on 1 K bath as outpt 3. Anemia - Hgb 11.7 - no ESA, resume weekly Fe 4. Secondary hyperparathyroidism -  Ca 10.7 - calcitriol on hold - had been on 3 Ca bath as an outpt with variability from Ca's 7s - 10s  And ^^ P as outpt; last iPTH <7 (parathyroidectomy 2008);  Consistent compliance an issue which makes it hard to regulate bath- change to 2 Ca bath for the remainder of his treatment- Ca content of bath needs re-evaluation at d/c 5. HTN/volume - 4.7 above EDW, goal set for 3 L - BP into 70s with only 1.9 off- goal appropriate - lower temp, UF as tolerated. 6. Nutrition - alb 3.1 7. CAD s/p CABG with Abnormal stress test EF 22% With apical ischemia - for heart cath today 8. Hx bioprosthetic AVR 2014   Myriam Jacobson, PA-C [place] [phone] 05/26/2015,9:34 AM  LOS: 3 days   Pt seen, examined and agree w A/P as above. For heart cath today after dialysis.  Kelly Splinter MD pager [phone]    cell [phone] 05/26/2015, 11:20 AM    Subjective:   Feeling a little woozy - BP 77 systolic  Objective Filed Vitals:   05/26/15 0706 05/26/15 0707 05/26/15 0733 05/26/15 0737  BP: 161/94 173/94 137/84 144/87  Pulse: 58 58 58 61  Temp: 97.3 F (36.3 C)     TempSrc: Oral     Resp: '16 17 18 18  '$ Height:      Weight: 85.7 kg (188 lb 15 oz)     SpO2: 91%      Physical Exam pre HD weight standing 85.7- goal 3 L General: NAD Heart: RRR  Lungs: crackles at bases Abdomen: soft NT Extremities:no edema Dialysis Access: left lower AVF   Dialysis Orders: [facility] MWF left lower AVF 4 hr 160 450/A 1.5 4000 heparin EDW 81 (just increased upon admission) 1 K 3 Ca profile 2 calcitriol 0.25, venofer 50 no ESA -  last Hgb 12.9 Ca levels have been elevated the past two months at his HD unit while on a 3 Ca bath-needs to be changed at d/c  Additional Objective Labs: Basic Metabolic Panel:  Recent Labs Lab 05/22/15 0510 05/23/15 1100 05/26/15 0449  NA 138 137 137  K 4.7 5.5* 6.1*  CL 99* 99* 98*  CO2 '26 23 26  '$ GLUCOSE 91 119* 91  BUN 35* 59* 65*  CREATININE 6.82* 9.40* 10.30*  CALCIUM 9.3 9.4 10.7*  PHOS  --  8.6*  --    Liver Function Tests:  Recent Labs Lab 05/23/15 1100  ALBUMIN 3.1*   CBC:  Recent Labs Lab 05/21/15 1215  05/23/15 0132 05/23/15 1100 05/24/15 0810 05/25/15 0330 05/26/15 0318  WBC 6.3  < > 6.3 8.3 6.1 6.6 7.0  NEUTROABS 4.5  --   --   --   --   --   --   HGB 13.7  < > 12.2* 12.1* 11.7* 11.7* 11.7*  HCT 41.8  < > 37.3* 37.3* 36.7* 35.5* 36.2*  MCV 98.6  < > 98.9 97.9 99.7 99.7 98.6  PLT 156  < > 159 156 141* 133* 136*  < > = values  in this interval not displayed.   Cardiac Enzymes:  Recent Labs Lab 05/21/15 2222 05/22/15 0510 05/22/15 1512 05/22/15 2116 05/23/15 0132  TROPONINI 1.37* 1.54* 1.24* 1.13* 1.09*  Medications:   . atorvastatin  40 mg Oral QHS  . calcium acetate  4,002 mg Oral TID WC  . citalopram  20 mg Oral Daily  . clopidogrel  75 mg Oral Q breakfast  . dextrose  25 g Intravenous Once  . docusate sodium  400 mg Oral BID  . famotidine  20 mg Oral Daily  . folic acid  1 mg Oral Daily  . gabapentin  300 mg Oral QHS  . insulin aspart  10 Units Intravenous Once  . metoprolol tartrate  25 mg Oral BID  . multivitamin  1 tablet Oral Daily  . sodium chloride  250 mL Intravenous Once  . sodium polystyrene  30 g Oral Once

## 2015-05-26 NOTE — Progress Notes (Addendum)
ANTICOAGULATION CONSULT NOTE - Follow Up Consult  Pharmacy Consult for Coumadin, heparin  Indication: chest pain/ACS and atrial fibrillation  Allergies  Allergen Reactions  . Diphenhydramine Itching    Only with IV doses. Tolerates oral.  . Penicillins Other (See Comments)    migraine  . Adhesive [Tape] Rash    Please use paper tape    Patient Measurements: Height: '5\' 10"'$  (177.8 cm) Weight: 184 lb 1.4 oz (83.5 kg) IBW/kg (Calculated) : 73   Vital Signs: Temp: 98.5 F (36.9 C) (09/19 1143) Temp Source: Oral (09/19 1143) BP: 167/89 mmHg (09/19 1750) Pulse Rate: 67 (09/19 1750)  Labs:  Recent Labs  05/24/15 0810 05/25/15 0330 05/26/15 0318 05/26/15 0449 05/26/15 1330  HGB 11.7* 11.7* 11.7*  --  11.8*  HCT 36.7* 35.5* 36.2*  --  36.2*  PLT 141* 133* 136*  --  122*  LABPROT 20.7* 24.3* 19.1*  --   --   INR 1.78* 2.20* 1.60*  --   --   HEPARINUNFRC 0.45 <0.10*  --   --   --   CREATININE  --   --   --  10.30* 5.49*    Estimated Creatinine Clearance: 14.8 mL/min (by C-G formula based on Cr of 5.49).   Medications:  Scheduled:  . adenosine  16 mL Intravenous Once  . [MAR Hold] atorvastatin  40 mg Oral QHS  . [MAR Hold] calcium acetate  4,002 mg Oral TID WC  . [MAR Hold] citalopram  20 mg Oral Daily  . [MAR Hold] clopidogrel  75 mg Oral Q breakfast  . dextrose  25 g Intravenous Once  . [MAR Hold] docusate sodium  400 mg Oral BID  . [MAR Hold] famotidine  20 mg Oral Daily  . [MAR Hold] ferric gluconate (FERRLECIT/NULECIT) IV  62.5 mg Intravenous Q Wed-HD  . [MAR Hold] folic acid  1 mg Oral Daily  . [MAR Hold] gabapentin  300 mg Oral QHS  . insulin aspart  10 Units Intravenous Once  . [MAR Hold] metoprolol tartrate  25 mg Oral BID  . [MAR Hold] multivitamin  1 tablet Oral Daily  . [MAR Hold] sodium chloride  250 mL Intravenous Once  . sodium chloride  3 mL Intravenous Q12H   Infusions:  . sodium chloride      Assessment: [name] with PAF on coumadin PTA and  now s/p cath. Pharmacy has been consulted to resume coumadin and heparin  Coumadin was started on 9/14 with last dose of '10mg'$  on 9/16 (INR noted 2.2 on 9/18).  Patient noted with recent bleeding from fistula on 9/17. Pharmacy also to restart heparin 8 hrs post femoral sheath removal (removed at about 4pm). Last heparin rate was 1500 units/hr with heparin level of 0.45. INR today= 1.6   Goal of Therapy:  INR 2-3 Heparin level 0.3-0.7 units/ml Monitor platelets by anticoagulation protocol: Yes   Plan:  -Coumadin 7.'5mg'$  po tonight -Daily PT/INR -Begin heparin 8 hrs post sheath removal at 1250 units/hr and titrate up with recent bleed -Heparin in 8 hrs and daily with CBC daily  Hildred Laser, Pharm D 05/26/2015 6:21 PM

## 2015-05-26 NOTE — Progress Notes (Signed)
Anderson Malta from Cath lab made aware of Critical lab value K 6.1 Dr. Marval Regal New orders received

## 2015-05-26 NOTE — Progress Notes (Signed)
Patient Name: [name] Date of Encounter: 05/26/2015  Active Problems:   CKD (chronic kidney disease) stage V requiring chronic dialysis   Atrial flutter with rapid ventricular response   Hypertension   Abnormal stress test     Primary Cardiologist: Dr. Angelena Form Dr. Delight Hoh [facility]) Patient Profile: 59 yo male w/PMH of CAD (s/p CABG in Aug 2014 with SVG to LAD), aortic valve replacement with Edwards 23 mm bioprosthetic valve during CABG in August 2014, PAF, ESRD (w/renal transplant with subsequent rejection on HD MWF), multiple sclerosis and OSA (not compliant on BiPAP) presented with aflutter with RVR during HD on 05/21/2015.  SUBJECTIVE: Feeling well. Denies any chest pain or palpitations. Reports some orthopnea. Underwent HD this morning. LHC planned for this AM but delayed due to hyperkalemia.  OBJECTIVE Filed Vitals:   05/26/15 1007 05/26/15 1037 05/26/15 1045 05/26/15 1143  BP: 95/64 97/70 108/70 125/78  Pulse: 75 80 75 68  Temp:   97.7 F (36.5 C) 98.5 F (36.9 C)  TempSrc:   Oral Oral  Resp: '18 18 18 20  '$ Height:      Weight:   184 lb 1.4 oz (83.5 kg)   SpO2:   91% 95%    Intake/Output Summary (Last 24 hours) at 05/26/15 1224 Last data filed at 05/26/15 1045  Gross per 24 hour  Intake      0 ml  Output   2525 ml  Net  -2525 ml   Filed Weights   05/25/15 0500 05/26/15 0706 05/26/15 1045  Weight: 184 lb 8 oz (83.689 kg) 188 lb 15 oz (85.7 kg) 184 lb 1.4 oz (83.5 kg)    PHYSICAL EXAM General: Well developed, well nourished, male in no acute distress. Head: Normocephalic, atraumatic.  Neck: Supple without bruits, JVD not elevated. Lungs:  Resp regular and unlabored, CTA. Heart: RRR, S1, S2, no S3, S4, or murmur; no rub. Abdomen: Soft, non-tender, non-distended with normoactive bowel sounds. No hepatomegaly. No rebound/guarding. No obvious abdominal masses. Extremities: No clubbing, cyanosis, or edema. Distal pedal pulses are 2+ bilaterally. Left  forearm fistula noted. Neuro: Alert and oriented X 3. Moves all extremities spontaneously. Psych: Normal affect.  LABS: CBC:  Recent Labs  05/25/15 0330 05/26/15 0318  WBC 6.6 7.0  HGB 11.7* 11.7*  HCT 35.5* 36.2*  MCV 99.7 98.6  PLT 133* 136*   INR:  Recent Labs  05/26/15 0318  INR 2.37*   Basic Metabolic Panel:  Recent Labs  05/26/15 0449 05/26/15 1100  NA 137  --   K 6.1* 3.9  CL 98*  --   CO2 26  --   GLUCOSE 91  --   BUN 65*  --   CREATININE 10.30*  --   CALCIUM 10.7*  --   BNP:  B NATRIURETIC PEPTIDE  Date/Time Value Ref Range Status  02/21/2015 01:15 PM 2505.1* 0.0 - 100.0 pg/mL Final  01/08/2015 10:17 PM 1085.0* 0.0 - 100.0 pg/mL Final   TELE: Normal sinus rhythm with rate in 60's - 80's with frequent PVC's.     Current Medications:  . [START ON 05/27/2015] aspirin  81 mg Oral Pre-Cath  . atorvastatin  40 mg Oral QHS  . calcium acetate  4,002 mg Oral TID WC  . citalopram  20 mg Oral Daily  . clopidogrel  75 mg Oral Q breakfast  . dextrose  25 g Intravenous Once  . docusate sodium  400 mg Oral BID  . famotidine  20 mg Oral Daily  . [  START ON 05/28/2015] ferric gluconate (FERRLECIT/NULECIT) IV  62.5 mg Intravenous Q Wed-HD  . folic acid  1 mg Oral Daily  . gabapentin  300 mg Oral QHS  . insulin aspart  10 Units Intravenous Once  . metoprolol tartrate  25 mg Oral BID  . multivitamin  1 tablet Oral Daily  . sodium chloride  250 mL Intravenous Once  . sodium chloride  3 mL Intravenous Q12H   . sodium chloride      ASSESSMENT AND PLAN: 1. Paroxysmal atrial flutter/Fibrillation  - spontaneously converted in the ED to NSR on 05/21/2015 - CHA2DS2-VASC score 2 (CAD, HTN), occurs in the setting of dialysis - Toprol XL '50mg'$  BID recently cut back to '25mg'$  BID due to hypotension - New start Coumadin has been held since 05/24/2015 secondary to upcoming heart catheterization on 05/26/2015.   2. Abnormal nuclear stress test-associated jaw pain  but no chest pain - Diffuse ST depression noted during tachycardia. ST depression resolved after convert to NSR. - serial trop trended up from POC 0.03 to trop I 0.58 --> 1.37 --> 1.54--> 1.09 - Nuclear stress test demonstrated EF of 22% and apical ischemia on 05/23/2015. Plan is for left heart catheterization today. (65-70% on echo on 04/14/15).   3. Hypotension with h/o HTN - nephrology increased EDW by 1kg to 81kg. Will avoid over aggressive dialysis next time.   4. CAD s/p CABG - Aug 2014 with SVG to LAD, we will evaluate with heart catheterization Monday.  5. Aortic valve replacement  - with Edwards 23 mm bioprosthetic valve during CABG in August 2014  6. ESRD  - with renal transplant in 02/2012 with subsequent rejection - HD MWF  7. Multiple Sclerosis   8. OSA  - not compliant on BiPAP  9. Hyperkalemia - Potassium elevated to 6.1 on 05/26/2015. Resolved to 3.9 following HD.  Arna Medici , PA-C 12:24 PM 05/26/2015 Pager: [phone]  I have seen and examined the patient along with Erma Heritage , PA-C.  I have reviewed the chart, notes and new data.  I agree with PA's note.  PLAN: Cardiac cath +/- PCI today. This procedure has been fully reviewed with the patient and written informed consent has been obtained.   Sanda Klein, MD, [place] [phone] 05/26/2015, 1:16 PM

## 2015-05-27 ENCOUNTER — Encounter (HOSPITAL_COMMUNITY): Payer: Self-pay | Admitting: Cardiology

## 2015-05-27 ENCOUNTER — Ambulatory Visit (HOSPITAL_COMMUNITY): Payer: Medicare Other

## 2015-05-27 ENCOUNTER — Telehealth: Payer: Self-pay | Admitting: Cardiovascular Disease

## 2015-05-27 DIAGNOSIS — I25118 Atherosclerotic heart disease of native coronary artery with other forms of angina pectoris: Secondary | ICD-10-CM

## 2015-05-27 DIAGNOSIS — Z9861 Coronary angioplasty status: Secondary | ICD-10-CM

## 2015-05-27 DIAGNOSIS — I259 Chronic ischemic heart disease, unspecified: Secondary | ICD-10-CM

## 2015-05-27 DIAGNOSIS — Z992 Dependence on renal dialysis: Secondary | ICD-10-CM

## 2015-05-27 LAB — BASIC METABOLIC PANEL
ANION GAP: 12 (ref 5–15)
BUN: 37 mg/dL — AB (ref 6–20)
CHLORIDE: 95 mmol/L — AB (ref 101–111)
CO2: 28 mmol/L (ref 22–32)
Calcium: 9 mg/dL (ref 8.9–10.3)
Creatinine, Ser: 7.24 mg/dL — ABNORMAL HIGH (ref 0.61–1.24)
GFR, EST AFRICAN AMERICAN: 8 mL/min — AB (ref >60)
GFR, EST NON AFRICAN AMERICAN: 7 mL/min — AB (ref >60)
Glucose, Bld: 76 mg/dL (ref 65–99)
POTASSIUM: 5.4 mmol/L — AB (ref 3.5–5.1)
SODIUM: 135 mmol/L (ref 135–145)

## 2015-05-27 LAB — CBC
HCT: 35.9 % — ABNORMAL LOW (ref 39.0–52.0)
HEMOGLOBIN: 11.6 g/dL — AB (ref 13.0–17.0)
MCH: 31.9 pg (ref 26.0–34.0)
MCHC: 32.3 g/dL (ref 30.0–36.0)
MCV: 98.6 fL (ref 78.0–100.0)
PLATELETS: 139 10*3/uL — AB (ref 150–400)
RBC: 3.64 MIL/uL — AB (ref 4.22–5.81)
RDW: 14.5 % (ref 11.5–15.5)
WBC: 6.6 10*3/uL (ref 4.0–10.5)

## 2015-05-27 LAB — HEPARIN LEVEL (UNFRACTIONATED): HEPARIN UNFRACTIONATED: 0.17 [IU]/mL — AB (ref 0.30–0.70)

## 2015-05-27 LAB — PROTIME-INR
INR: 1.42 (ref 0.00–1.49)
PROTHROMBIN TIME: 17.4 s — AB (ref 11.6–15.2)

## 2015-05-27 MED ORDER — WARFARIN SODIUM 7.5 MG PO TABS: 7.5000 mg | ORAL_TABLET | Freq: Every day | ORAL | Status: DC

## 2015-05-27 MED ORDER — METOPROLOL TARTRATE 25 MG PO TABS: 25.0000 mg | ORAL_TABLET | Freq: Two times a day (BID) | ORAL | Status: DC

## 2015-05-27 MED ORDER — METOPROLOL TARTRATE 50 MG PO TABS: 50.0000 mg | ORAL_TABLET | Freq: Every morning | ORAL | Status: DC

## 2015-05-27 MED ORDER — METOPROLOL TARTRATE 25 MG PO TABS: 25.0000 mg | ORAL_TABLET | Freq: Every day | ORAL | Status: DC

## 2015-05-27 MED ORDER — WARFARIN SODIUM 7.5 MG PO TABS: 7.5000 mg | ORAL_TABLET | Freq: Once | ORAL | Status: DC

## 2015-05-27 NOTE — Care Management Important Message (Signed)
Important Message  Patient Details  Name: [name] MRN: [id number] Date of Birth: 1955-09-11   Medicare Important Message Given:  Yes-second notification given    Nathen May 05/27/2015, 11:36 AM

## 2015-05-27 NOTE — Progress Notes (Signed)
  Echocardiogram 2D Echocardiogram has been performed.  [name] 05/27/2015, 11:52 AM

## 2015-05-27 NOTE — Discharge Summary (Signed)
Physician Discharge Summary  Patient ID: [name] MRN: [id number] DOB/AGE: 1956-03-01 59 y.o.   Primary Cardiologist: Dr. Angelena Form  Admit date: 05/21/2015 Discharge date: 05/27/2015  Admission Diagnoses: Atrial Fibrillation w/ RVR  Discharge Diagnoses:  Active Problems:   CKD (chronic kidney disease) stage V requiring chronic dialysis   Atrial flutter with rapid ventricular response   Hypertension   Abnormal stress test   Atrial fibrillation with rapid ventricular response   Coronary artery disease involving native coronary artery of native heart with other form of angina pectoris   CAD S/P percutaneous coronary angioplasty   Discharged Condition: stable  Past Medical History: The patient is a 59 year old Caucasian male, followed by Dr. Angelena Form, with a past medical history of CAD s/p CABG in Aug 2014 with SVG to LAD, aortic valve replacement with Edwards 23 mm bioprosthetic valve at time of CABG, paroxysmal atrial fibrillation, ESRD with renal transplant with subsequent rejection on HD MWF, multiple sclerosis and obstructive sleep apnea non compliant with BiPAP. 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 Unfortunately, his transplanted kidney subsequently failed and he was restarted on dialysis.  In May 2015, he was admitted for a NSTEMI and underwent a repeat LHC and required PCI of the left circumflex (resolute drug-eluting stent) and SVG to LAD (Herculink bare metal stent). His most recent echocardiogram, prior to admission, was in 04/2015 which showed an EF of 81-19%, grade 1 diastolic dysfunction, mild to moderate aortic regurg and mild MS/MR.  Hospital Course: The patient presented to [facility] on 05/21/15 with symptomatic atrial arrthymais after undergoing a hemodialysis session. Patient developed SVT during HD and  on arrival to ER was in atrial flutter with variable block (appeared to be atypical flutter) with RVR and converted spontaneously to NSR.  Diffuse ST depressions were noted during tachycardia and resolved once converted to NSR. Cardiac enzymes were cycled and were elevated  0.58 --> 1.37 --> 1.54--> 1.09. Subsequent NST demonstrated an EF of 22% and apical ischemia.  This lead to a LHC by Dr. Ellyn Hack on 05/26/15. This showed the following:  1. Mid LAD lesion, 100% stenosed. Ost 2nd Diag lesion, 99% stenosed. 2. Prox RCA lesion, 50% stenosed - as described by [facility] Cath Report. 3. Y graft SVG-LAD-D2 is a Very Large graft with Ostial ~40% stenosis, widely patent stent into the LAD segment with jailed Y segment to D2 that has ~50% ostial stenosis large. 4. Mid Cx lesion, 65% stenosed -- In-stent stenosis of Resolute DES stent was placed in May 2016 -- FFR 0.87, Not Physiologically Significant.  With the nonischemic FFR of the circumflex lesion, it was felt that this was not a flow-limiting lesion that could explain his high risk stress test. This is the distribution that his stress test showed positivity during his non-STEMI in May. It is possible that this is simply infarct with peri-infarct ischemia in the setting of A. Fib. It was also felt that his low EF, by stress test, was due to possible nuclear gating error.  No coronary intervention was required. Continued medical therapy was recommended. He was continued on Lipitor, Plavix and metoprolol. ASA was discontinued and Coumadin was initiated for his PAF.  He had no further arrhthymias and no post cath complications. Nephrology assisted with HD during his hospitalization. He did have issues with hypotension, but this resolved after medication adjustments. His lisinopril was discontinued and  metoprolol was reduced to 25 mg BID.   Given the fact that it was felt there was a possible gating error during his stress test, which interpreted a low EF, decision was  made to assess LVF with a 2D echo (results pending and will f/u in the office). However, he had no signs of acute HF. He was last seen and examined by Dr. Sallyanne Kuster, who determined he was stable for discharge home. Post hospital follow-up has been scheduled with Melina Copa, PA-C. He is also scheduled for a new coumadin appointment in our office in 1 week. He is scheduled to follow-up with Dr. Angelena Form on 08/06/15.     Consults: nephrology  Significant Diagnostic Studies:  NST 05/23/15 Interpretation Summary     5. There was no ST segment deviation noted during stress. 6. Findings consistent with ischemia and prior myocardial infarction. 7. This is a high risk study. 8. The left ventricular ejection fraction is severely decreased (<30%).  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 Apical ischemia EF 22%   LHC 05/26/15 Conclusion    9. Mid LAD lesion, 100% stenosed. Ost 2nd Diag lesion, 99% stenosed. 10. Prox RCA lesion, 50% stenosed - as described by [facility] Cath Report. 82. Y graft SVG-LAD-D2 is a Very Large graft with Ostial ~40% stenosis, widely patent stent into the LAD segment with jailed Y segment to D2 that has ~50% ostial stenosis large. 12. Mid Cx lesion, 65% stenosed -- In-stent stenosis of Resolute DES stent was placed in May 2016 -- FFR 0.87, Not Physiologically Significant. 13. Unable to cross prosthetic valve.  With the nonischemic FFR of the circumflex lesion, the does not appear to be a flow-limiting lesion that could explain the patient's high risk stress test. This is the distribution that is stress test showed positivity during his non-STEMI in May. It is possible that this is simply infarct with peri-infarct ischemia in the setting of A. fib.  There is moderate disease in the RCA that was described by the [facility] report. The stent in the graft is widely patent with non-flow-limiting jailed ostial stenosis of the segment to the D2.   Recommendations:  Standard post  cath care.  Would consider rhythm control as the patient has significant issues with recurrent PAF.  Would order 2-D echocardiogram to get a new assessment of the patient's valve and EF to compare with Myoview  Continue risk factor modification.  Restart warfarin with IV heparin bridging 8 hours post sheath removal.     Treatments: See Hospital Course  Discharge Exam: Blood pressure 120/70, pulse 80, temperature 98.1 F (36.7 C), temperature source Oral, resp. rate 19, height '5\' 10"'$  (1.778 m), weight 184 lb 1.4 oz (83.5 kg), SpO2 94 %.  Disposition: 01-Home or Self Care     Medication List    STOP taking these medications        aspirin EC 81 MG tablet     lisinopril 20 MG tablet  Commonly known as:  PRINIVIL,ZESTRIL     metoprolol succinate 25 MG 24 hr tablet  Commonly known as:  TOPROL-XL      TAKE these medications        acetaminophen 500 MG tablet  Commonly known as:  TYLENOL  Take 1,000 mg by mouth every 6 (six) hours as needed for pain or fever.     atorvastatin 40 MG tablet  Commonly known as:  LIPITOR  Take 1 tablet (40 mg total) by mouth at bedtime.  calcium acetate 667 MG capsule  Commonly known as:  PHOSLO  Take 4,002 mg by mouth 3 (three) times daily with meals.     citalopram 20 MG tablet  Commonly known as:  CELEXA  Take 20 mg by mouth daily.     clopidogrel 75 MG tablet  Commonly known as:  PLAVIX  Take 1 tablet (75 mg total) by mouth daily with breakfast.     docusate sodium 100 MG capsule  Commonly known as:  COLACE  Take 400 mg by mouth 2 (two) times daily.     folic acid 1 MG tablet  Commonly known as:  FOLVITE  Take 1 mg by mouth daily.     gabapentin 300 MG capsule  Commonly known as:  NEURONTIN  Take 300 mg by mouth at bedtime.     metoprolol tartrate 25 MG tablet  Commonly known as:  LOPRESSOR  Take 1 tablet (25 mg total) by mouth 2 (two) times daily.     multivitamin Tabs tablet  Take 1 tablet by mouth daily.      nitroGLYCERIN 0.4 MG SL tablet  Commonly known as:  NITROSTAT  Place 1 tablet (0.4 mg total) under the tongue every 5 (five) minutes as needed for chest pain (Take one tabe every 5-10 minutes for chest pain. Once you have taken three tabs you need to notify your MD or go to ER if chest pain not gone.).     PROAIR HFA 108 (90 BASE) MCG/ACT inhaler  Generic drug:  albuterol  Inhale 1-2 puffs into the lungs every 6 (six) hours as needed.     ranitidine 150 MG tablet  Commonly known as:  ZANTAC  Take 150 mg by mouth daily.     sodium polystyrene 15 GM/60ML suspension  Commonly known as:  KAYEXALATE  Take 15 g by mouth See admin instructions. Only take 15 g on Sun / Tues / Thurs / Sat (non dialysis days)     warfarin 7.5 MG tablet  Commonly known as:  COUMADIN  Take 1 tablet (7.5 mg total) by mouth daily at 6 PM.       Follow-up Information    Follow up with Melina Copa, PA-C On 06/05/2015.   Specialties:  Cardiology, Radiology   Why:  10:00 am    Contact information:   [address] [place] [address] [phone]       Follow up with [facility] On 06/05/2015.   Why:  10:30 AM (Coumadin Check, same place as doctor's appointment)   Contact information:   [address] [place] [phone]      TIME SPENT ON DISCHARGE, INCLUDING PHYSICIAN TIME: >30 MINUTES.   SignedLyda Jester 05/27/2015, 11:30 AM

## 2015-05-27 NOTE — Progress Notes (Addendum)
ANTICOAGULATION CONSULT NOTE - Follow Up Consult  Pharmacy Consult for Coumadin, heparin  Indication: afib  Allergies  Allergen Reactions  . Diphenhydramine Itching    Only with IV doses. Tolerates oral.  . Penicillins Other (See Comments)    migraine  . Adhesive [Tape] Rash    Please use paper tape    Patient Measurements: Height: '5\' 10"'$  (177.8 cm) Weight: 184 lb 1.4 oz (83.5 kg) IBW/kg (Calculated) : 73  Heparin dosing weight ~83kg   Vital Signs: Temp: 98.1 F (36.7 C) (09/20 0500) Temp Source: Oral (09/20 0500) BP: 120/70 mmHg (09/20 0834) Pulse Rate: 80 (09/20 0834)  Labs:  Recent Labs  05/25/15 0330 05/26/15 0318 05/26/15 0449 05/26/15 1330 05/27/15 0443 05/27/15 0950  HGB 11.7* 11.7*  --  11.8* 11.6*  --   HCT 35.5* 36.2*  --  36.2* 35.9*  --   PLT 133* 136*  --  122* 139*  --   LABPROT 24.3* 19.1*  --   --  17.4*  --   INR 2.20* 1.60*  --   --  1.42  --   HEPARINUNFRC <0.10*  --   --   --   --  0.17*  CREATININE  --   --  10.30* 5.49* 7.24*  --     Estimated Creatinine Clearance: 11.2 mL/min (by C-G formula based on Cr of 7.24).   Medications:  Scheduled:  . adenosine  16 mL Intravenous Once  . atorvastatin  40 mg Oral QHS  . calcium acetate  4,002 mg Oral TID WC  . citalopram  20 mg Oral Daily  . clopidogrel  75 mg Oral Q breakfast  . dextrose  25 g Intravenous Once  . docusate sodium  400 mg Oral BID  . famotidine  20 mg Oral Daily  . [START ON 05/28/2015] ferric gluconate (FERRLECIT/NULECIT) IV  62.5 mg Intravenous Q Wed-HD  . folic acid  1 mg Oral Daily  . gabapentin  300 mg Oral QHS  . insulin aspart  10 Units Intravenous Once  . [START ON 05/28/2015] metoprolol tartrate  50 mg Oral q morning - 10a  . metoprolol tartrate  25 mg Oral QHS  . multivitamin  1 tablet Oral Daily  . sodium chloride  250 mL Intravenous Once  . sodium chloride  3 mL Intravenous Q12H  . sodium chloride  3 mL Intravenous Q12H  . Warfarin - Pharmacist Dosing  Inpatient   Does not apply q1800   Infusions:  . heparin 1,250 Units/hr (05/27/15 0600)    Assessment: 74 YOM with PAF now s/p cath. Pharmacy has been consulted to resume coumadin and heparin for afib.  Coumadin was started on 9/14 with last dose of '10mg'$  on 9/16 (INR noted 2.2 on 9/18).  Patient noted with recent bleeding from fistula on 9/17. Pharmacy also to restart heparin 8 hrs post femoral sheath removal on 9/19.  Coumadin score = 5, CHADS2VASc = 2  Some bleeding from fistula site on 9/17 - heparin was held prior to cath on 9/19, no further bleed/IV line issues per RN  HL subtherapeutic 0.17 after restart 8h post-sheath removal INR subtherapeutic, decreased 1.6>>1.42 (not critical to be fully therapeutic on dc per cards)  Goal of Therapy:  INR 2-3 Heparin level 0.3-0.7 units/ml Monitor platelets by anticoagulation protocol: Yes   Plan:  -Coumadin 7.'5mg'$  po x 1 dose tonight -Daily PT/INR -Increase heparin to 1500 units/hr - d/c when INR therapeutic -8h HL, daily HL with CBC daily -Monitor s/sx  bleeding - previously at fistula site  Elicia Lamp, PharmD Clinical Pharmacist Pager [phone] 05/27/2015 10:55 AM

## 2015-05-27 NOTE — Progress Notes (Addendum)
[facility] Progress Note  Assessment/Plan: 1. Afib/flutter- holding new start to coumadin due to heart cath; BB dose reduced due to low BP. Possible DC home today. Heparin restarted post cath.  2. ESRD - MWF - K 6.1 yesterday. K+ 5.4 today. For HD tomorrow. For possible DC today. Will hold off orders until confirmed whether pt will be dcd. 3. Anemia - Hgb 11.7 - no ESA, resume weekly Fe 4. Secondary hyperparathyroidism - Ca 10.7 - calcitriol on hold - had been on 3 Ca bath as an outpt with variability from Ca's 7s - 10s And ^^ P as outpt; last iPTH <7 (parathyroidectomy 2008); Consistent compliance an issue which makes it hard to regulate bath- change to 2 Ca bath for the remainder of his treatment- Ca content of bath needs re-evaluation at d/c 5. HTN/volume - HD yesterday. Net UF 2525 Post Wt 83.5 kg. BP Stable.  6. Nutrition - alb 3.1 renal diet/renal vit 7. CAD s/p CABG with Abnormal stress test EF 22% Cardiac cath yesterday per Dr. Ellyn Hack. No interventions.  8. Hx bioprosthetic AVR 2014   Rita H. Brown NP-C 05/27/2015, 9:14 AM  [facility] [phone]  Pt seen, examined and agree w A/P as above.  Kelly Splinter MD pager [phone]    cell [phone] 05/27/2015, 12:49 PM    Subjective:  "I feel just fine".   Objective Filed Vitals:   05/26/15 2055 05/26/15 2058 05/27/15 0500 05/27/15 0834  BP: 128/74 128/74 126/74 120/70  Pulse: 84 84  80  Temp: 98 F (36.7 C)  98.1 F (36.7 C)   TempSrc: Oral  Oral   Resp: 20  19   Height:      Weight:      SpO2: 95%  94%    Physical Exam General: NAD Heart: S1, S2. III/VI Systolic M. Lungs:Bilaleral breath sounds CTA Abdomen: active BS. No tenderness/distention Extremities:no edema Dialysis Access: left lower AVF   Dialysis Orders: [facility] MWF left lower AVF 4 hr 160 450/A 1.5 4000 heparin EDW 81 (just increased upon admission)profile 2 calcitriol 0.25, venofer 50 no ESA - last Hgb 12.9 Additional  Objective Labs: Basic Metabolic Panel:  Recent Labs Lab 05/23/15 1100 05/26/15 0449 05/26/15 1100 05/26/15 1330 05/27/15 0443  NA 137 137  --  136 135  K 5.5* 6.1* 3.9 4.6 5.4*  CL 99* 98*  --  98* 95*  CO2 23 26  --  31 28  GLUCOSE 119* 91  --  86 76  BUN 59* 65*  --  22* 37*  CREATININE 9.40* 10.30*  --  5.49* 7.24*  CALCIUM 9.4 10.7*  --  9.2 9.0  PHOS 8.6*  --   --  3.9  --    Liver Function Tests:  Recent Labs Lab 05/23/15 1100 05/26/15 1330  ALBUMIN 3.1* 3.1*   No results for input(s): LIPASE, AMYLASE in the last 168 hours. CBC:  Recent Labs Lab 05/21/15 1215  05/24/15 0810 05/25/15 0330 05/26/15 0318 05/26/15 1330 05/27/15 0443  WBC 6.3  < > 6.1 6.6 7.0 5.9 6.6  NEUTROABS 4.5  --   --   --   --   --   --   HGB 13.7  < > 11.7* 11.7* 11.7* 11.8* 11.6*  HCT 41.8  < > 36.7* 35.5* 36.2* 36.2* 35.9*  MCV 98.6  < > 99.7 99.7 98.6 98.4 98.6  PLT 156  < > 141* 133* 136* 122* 139*  < > = values in this  interval not displayed. Blood Culture    Component Value Date/Time   SDES BLOOD RIGHT ARM 12/05/2012 1408   SPECREQUEST BOTTLES DRAWN AEROBIC AND ANAEROBIC 10CC 12/05/2012 1408   CULT NO GROWTH 5 DAYS 12/05/2012 1408   REPTSTATUS 12/11/2012 FINAL 12/05/2012 1408    Cardiac Enzymes:  Recent Labs Lab 05/21/15 2222 05/22/15 0510 05/22/15 1512 05/22/15 2116 05/23/15 0132  TROPONINI 1.37* 1.54* 1.24* 1.13* 1.09*   CBG: No results for input(s): GLUCAP in the last 168 hours. Iron Studies: No results for input(s): IRON, TIBC, TRANSFERRIN, FERRITIN in the last 72 hours. '@lablastinr3'$ @ Studies/Results: No results found. Medications: . heparin 1,250 Units/hr (05/27/15 0600)   . adenosine  16 mL Intravenous Once  . atorvastatin  40 mg Oral QHS  . calcium acetate  4,002 mg Oral TID WC  . citalopram  20 mg Oral Daily  . clopidogrel  75 mg Oral Q breakfast  . dextrose  25 g Intravenous Once  . docusate sodium  400 mg Oral BID  . famotidine  20 mg Oral  Daily  . [START ON 05/28/2015] ferric gluconate (FERRLECIT/NULECIT) IV  62.5 mg Intravenous Q Wed-HD  . folic acid  1 mg Oral Daily  . gabapentin  300 mg Oral QHS  . insulin aspart  10 Units Intravenous Once  . metoprolol tartrate  25 mg Oral BID  . multivitamin  1 tablet Oral Daily  . sodium chloride  250 mL Intravenous Once  . sodium chloride  3 mL Intravenous Q12H  . sodium chloride  3 mL Intravenous Q12H  . Warfarin - Pharmacist Dosing Inpatient   Does not apply [phone]

## 2015-05-27 NOTE — Telephone Encounter (Signed)
TCM per  B Simmons  9/29 @ 10am w/ Lisbeth Renshaw

## 2015-05-27 NOTE — Progress Notes (Signed)
Patient Name: [name] Date of Encounter: 05/27/2015  Active Problems:   CKD (chronic kidney disease) stage V requiring chronic dialysis   Atrial flutter with rapid ventricular response   Hypertension   Abnormal stress test   Atrial fibrillation with rapid ventricular response   Coronary artery disease involving native coronary artery of native heart with other form of angina pectoris   CAD S/P percutaneous coronary angioplasty   Length of Stay: 4  SUBJECTIVE  Feels well and wants to go home. Cath showed low risk anatomy. Several known moderate stenoses are present. None appear to be responsible for ischemia by angiography and/or FFR analysis. Medical therapy recommended. Coumadin has been restarted.  CURRENT MEDS . adenosine  16 mL Intravenous Once  . atorvastatin  40 mg Oral QHS  . calcium acetate  4,002 mg Oral TID WC  . citalopram  20 mg Oral Daily  . clopidogrel  75 mg Oral Q breakfast  . dextrose  25 g Intravenous Once  . docusate sodium  400 mg Oral BID  . famotidine  20 mg Oral Daily  . [START ON 05/28/2015] ferric gluconate (FERRLECIT/NULECIT) IV  62.5 mg Intravenous Q Wed-HD  . folic acid  1 mg Oral Daily  . gabapentin  300 mg Oral QHS  . insulin aspart  10 Units Intravenous Once  . metoprolol tartrate  25 mg Oral BID  . multivitamin  1 tablet Oral Daily  . sodium chloride  250 mL Intravenous Once  . sodium chloride  3 mL Intravenous Q12H  . sodium chloride  3 mL Intravenous Q12H  . Warfarin - Pharmacist Dosing Inpatient   Does not apply q1800    OBJECTIVE   Intake/Output Summary (Last 24 hours) at 05/27/15 0856 Last data filed at 05/27/15 0005  Gross per 24 hour  Intake    483 ml  Output   2525 ml  Net  -2042 ml   Filed Weights   05/25/15 0500 05/26/15 0706 05/26/15 1045  Weight: 184 lb 8 oz (83.689 kg) 188 lb 15 oz (85.7 kg) 184 lb 1.4 oz (83.5 kg)    PHYSICAL EXAM Filed Vitals:   05/26/15 2055 05/26/15 2058 05/27/15 0500 05/27/15 0834  BP:  128/74 128/74 126/74 120/70  Pulse: 84 84  80  Temp: 98 F (36.7 C)  98.1 F (36.7 C)   TempSrc: Oral  Oral   Resp: 20  19   Height:      Weight:      SpO2: 95%  94%    General: Alert, oriented x3, no distress Head: no evidence of trauma, PERRL, EOMI, no exophtalmos or lid lag, no myxedema, no xanthelasma; normal ears, nose and oropharynx Neck: normal jugular venous pulsations and no hepatojugular reflux; brisk carotid pulses without delay and no carotid bruits Chest: clear to auscultation, no signs of consolidation by percussion or palpation, normal fremitus, symmetrical and full respiratory excursions Cardiovascular: normal position and quality of the apical impulse, regular rhythm, normal first and second heart sounds, no rubs or gallops, 3/6 early peaking aortic ejection murmur, no diastolic murmur Abdomen: no tenderness or distention, no masses by palpation, no abnormal pulsatility or arterial bruits, normal bowel sounds, no hepatosplenomegaly Extremities: no clubbing, cyanosis or edema; very large AV fistula left forearm, excellent thrill and bruit, no bleeding; 2+ radial, ulnar and brachial pulses bilaterally; 2+ right femoral, posterior tibial and dorsalis pedis pulses; 2+ left femoral, posterior tibial and dorsalis pedis pulses; no subclavian or femoral bruits Neurological: grossly nonfocal  LABS  CBC  Recent Labs  05/26/15 1330 05/27/15 0443  WBC 5.9 6.6  HGB 11.8* 11.6*  HCT 36.2* 35.9*  MCV 98.4 98.6  PLT 122* 485*   Basic Metabolic Panel  Recent Labs  05/26/15 1330 05/27/15 0443  NA 136 135  K 4.6 5.4*  CL 98* 95*  CO2 31 28  GLUCOSE 86 76  BUN 22* 37*  CREATININE 5.49* 7.24*  CALCIUM 9.2 9.0  PHOS 3.9  --    Liver Function Tests  Recent Labs  05/26/15 1330  ALBUMIN 3.1*    Radiology Studies Imaging results have been reviewed and No results found.  TELE NSR   ASSESSMENT AND PLAN  1. Paroxysmal atrial flutter/Fibrillation  -  spontaneously converted in the ED to NSR on 05/21/2015 - CHA2DS2-VASC score 2 (CAD, HTN), occurs in the setting of dialysis - Toprol XL '50mg'$  BID recently cut back to '25mg'$  BID due to hypotension - New start Coumadin . I do not think it is critical that he be fully therapeutic by discharge, will arrange coumadin clinic f/u this week  2. Abnormal nuclear stress test-associated jaw pain but no chest pain - Diffuse ST depression noted during tachycardia. ST depression resolved after convert to NSR. - serial trop trended up from POC 0.03 to trop I 0.58 --> 1.37 --> 1.54--> 1.09 - Nuclear stress test demonstrated EF of 22% and apical ischemia on 05/23/2015.  - Suspect nuclear gating error explains the low EF. Echo ordered an will try to do before DC, but can do as an outpt if there is delay  3. Hypotension with h/o HTN - nephrology increased EDW by 1kg to 81kg.   4. CAD s/p CABG CATH 9/19 1. Mid LAD lesion, 100% stenosed. Ost 2nd Diag lesion, 99% stenosed. 2. Prox RCA lesion, 50% stenosed - as described by [facility] Cath Report. 3. Y graft SVG-LAD-D2 is a Very Large graft with Ostial ~40% stenosis, widely patent stent into the LAD segment with jailed Y segment to D2 that has ~50% ostial stenosis large. 4. Mid Cx lesion, 65% stenosed -- In-stent stenosis of Resolute DES stent was placed in May 2016 -- FFR 0.87, Not Physiologically Significant. 5. Unable to cross prosthetic valve.  With the nonischemic FFR of the circumflex lesion, the does not appear to be a flow-limiting lesion that could explain the patient's high risk stress test. This is the distribution that is stress test showed positivity during his non-STEMI in May. It is possible that this is simply infarct with peri-infarct ischemia in the setting of A. fib.  There is moderate disease in the RCA that was described by the [facility] report. The stent in the graft is widely patent with non-flow-limiting jailed ostial stenosis of the segment to  the D2.  5. Aortic valve replacement  - with Edwards 23 mm bioprosthetic valve during CABG in August 2014  6. ESRD  - with renal transplant in 02/2012 with subsequent rejection - HD MWF  7. Multiple Sclerosis   8. OSA  - not compliant on BiPAP  Sanda Klein, MD, [facility] [phone] office [phone] pager 05/27/2015 8:56 AM

## 2015-05-28 NOTE — Telephone Encounter (Signed)
Left message with wife to have pt call back. Wife states pt is at dialysis at this time and agrees to have pt call back upon return.

## 2015-05-28 NOTE — Telephone Encounter (Signed)
New Message  Dr. Garlon Hatchet office is calling from [place]   Pt c/o medication issue:  1. Name of Medication: Plavix '75mg'$  and Coumadian 7.'5mg'$   2. How are you currently taking this medication (dosage and times per day)? Both pt takes 1xday  3. Are you having a reaction (difficulty breathing--STAT)? No    4. What is your medication issue?pt on two high blood thiners at the same time, is this a correct order?

## 2015-05-28 NOTE — Telephone Encounter (Signed)
I left a message for [place] at Dr Arna Medici office that the pt's medications are correct and he should be taking Plavix and Warfarin, Aspirin was stopped.      Patient contacted regarding discharge from [facility] on 05/27/15.  Patient understands to follow up with provider Melina Copa PA on 06/05/2015 at 10:00 AM  at [facility] office (coumadin clinic visit same day). Patient understands discharge instructions? yes Patient understands medications and regiment? yes Patient understands to bring all medications to this visit? yes  I made the pt aware that his PCP office did contact our office in regards to Plavix and Warfarin and these medications are correct. The pt will contact our office with any other questions or concerns.

## 2015-06-02 DIAGNOSIS — I4892 Unspecified atrial flutter: Secondary | ICD-10-CM | POA: Diagnosis not present

## 2015-06-02 LAB — PROTIME-INR: INR: 3.3 — AB (ref 0.9–1.1)

## 2015-06-04 ENCOUNTER — Ambulatory Visit (INDEPENDENT_AMBULATORY_CARE_PROVIDER_SITE_OTHER): Payer: Medicare Other | Admitting: Internal Medicine

## 2015-06-04 ENCOUNTER — Encounter: Payer: Self-pay | Admitting: Physician Assistant

## 2015-06-04 DIAGNOSIS — I471 Supraventricular tachycardia: Secondary | ICD-10-CM | POA: Insufficient documentation

## 2015-06-04 DIAGNOSIS — D649 Anemia, unspecified: Secondary | ICD-10-CM | POA: Insufficient documentation

## 2015-06-04 DIAGNOSIS — I4892 Unspecified atrial flutter: Secondary | ICD-10-CM

## 2015-06-04 DIAGNOSIS — I4891 Unspecified atrial fibrillation: Secondary | ICD-10-CM

## 2015-06-04 DIAGNOSIS — I214 Non-ST elevation (NSTEMI) myocardial infarction: Secondary | ICD-10-CM

## 2015-06-04 DIAGNOSIS — Z5181 Encounter for therapeutic drug level monitoring: Secondary | ICD-10-CM | POA: Insufficient documentation

## 2015-06-04 NOTE — Progress Notes (Signed)
Cardiology Office Note Date:  06/05/2015  Patient ID:  [name] 08-22-1956, MRN [id number] PCP:  Teressa Lower, MD  Cardiologist:  Angelena Form - due to establish care with him in 07/2015 (previously followed at [facility])  Chief Complaint: f/u hospitalization for atrial fib/flutter/SVT  History of Present Illness: [name] is a 59 y.o. 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 There was question of possible LV dysfunction by nuc but normal EF by f/u echo 05/27/15.  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 He had renal transplant between 2008-2011 and was temporarily off hemodialysis during that period. Unfortunately, his transplanted kidney subsequently failed and he was restarted on dialysis. In May 2016, he was admitted for a NSTEMI at [facility] and underwent PCI of the left circumflex (Resolute drug-eluting stent) and SVG to LAD (Herculink bare metal stent). Echocardiogram done prior to recent admission in 04/2015 showed an EF of 94-49%, grade 1 diastolic dysfunction, mild to moderate aortic regurg and mild MS/MR. Per notes he has prior history of PAF during HD but very short lived.  He is here to f/u recent hospitalization 9/14-9/20/16, prompted by development of recurrent symptomatic atrial arrhythmias during HD session. This admission, he developed SVT during HD and on arrival to ER was in atrial flutter with variable block (appeared to be atypical flutter) with RVR and converted  spontaneously to NSR.He apparently also had paroxysms of atrial fib during dialysis sessions as well. He ruled in for NSTEMI with peak troponin 1.54 and underwent a nuclear stress test that was abnormal with apical ischemia. He underwent LHC 05/26/15 showing:  1.   Mid LAD lesion, 100% stenosed. Ost 2nd Diag lesion, 99% stenosed. 2.   Prox RCA lesion, 50% stenosed - as described by [facility] Cath Report. 3.   Y graft SVG-LAD-D2 is a Very Large graft with Ostial ~40% stenosis, widely patent stent into the LAD segment with jailed Y segment to D2 that has ~50% ostial stenosis large. 4.   Mid Cx lesion, 65% stenosed -- In-stent stenosis of Resolute DES stent was placed in May 2016 -- FFR 0.87, Not Physiologically Significant.  With the nonischemic FFR of the circumflex lesion, it was felt that this was not a flow-limiting lesion that could explain his high risk stress test. This is the distribution that his stress test showed positivity during his non-STEMI in May. It was felt possible that this is simply infarct with peri-infarct ischemia in the setting of atrial fibrillation. It was also felt that his low EF by stress test was due to possible nuclear gating error. No coronary intervention was required. Continued medical therapy was recommended. He was continued on Lipitor, Plavix and metoprolol. ASA was discontinued and Coumadin was initiated for his PAF.He had no further arrhthymias. He did have issues with hypotension during his admission, but this resolved after medication adjustments (lisinopril was discontinued and metoprolol was reduced to 25 mg BID). Close outpatient follow-up was arranged with echocardiogram in the office (note no signs of acute CHF during recent adimssion). Labwork otherwise notable  for Hgb 11.6 which remained stable during hospitalization. The possibility of starting amiodarone was discussed with the patient but he preferred to think about this for now. 2D Echo 05/27/15: EF 55-60%, no  RWMA, mild AS/mild AI, mod MS, severely dilated LA.  The patient presents for post-hospital follow-up today and is doing well. He denies any recurrent CP, SOB, or palpitations. He reports they raised his dry weight at HD and he has done better since that time. No further issues with hypotension. He is not sure who is following his lipids. Denies any bleeding issues.   Past Medical History  Diagnosis Date  . 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 04/2013: s/p CABG x 2 (Y SVG -LAD & D2). b. 01/2015: NSTEMI s/p DES to LCx and BMS to SVG-LAD. c. NSTEMI 05/2015 in setting of atrial arrhythmias - cath without indication for PCI.  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 Peripheral vascular disease     Cath in 01/2015 required 45 cm Destination Sheath  . Sleep apnea     a. intolerant of bipap.  Marland Kitchen PAF (paroxysmal atrial fibrillation)   . Multiple sclerosis   . Renal transplant failure and rejection   . 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 b. He had a renal Tx from 2008 to 2011, but subsequent rejection - Gets HD in [place], [place] on MWF schedule.    Marland Kitchen Hypertension   . Multiple sclerosis   . SVT (supraventricular tachycardia)   . Paroxysmal atrial flutter     a. During 05/2015 admission - SVT, atrial flutter, and PAF.  Marland Kitchen Anemia   . Valvular heart disease     a. 2D echo 05/2015: EF 55-60%, no RWMA, mild AS/mild AI, mod MS, severely dilated LA.    Past Surgical History  Procedure Laterality Date  . Av fistula placement    . Flash      flash pulmonary edema  . Kidney transplant  08/2007  . Hernia repair  07/2011  . Excised squamous cells at rectum  2006  . Left heart catheterization with coronary angiogram N/A 01/09/2013    Procedure: LEFT HEART CATHETERIZATION WITH CORONARY ANGIOGRAM;  Surgeon: Burnell Blanks, MD;   Location: [facility] CATH LAB;  Service: Cardiovascular;  Laterality: N/A;  . Cardiac catheterization N/A 05/26/2015    Procedure: Left Heart Cath and Coronary Angiography;  Surgeon: Leonie Man, MD;  Location: [facility] CV LAB;  Service: Cardiovascular;  Laterality: N/A;  . Cardiac catheterization N/A 05/26/2015    Procedure: Intravascular Pressure Wire/FFR Study;  Surgeon: Leonie Man, MD;  Location: [place] [facility] CV LAB;  Service: Cardiovascular;  Laterality: N/A;    Current Outpatient Prescriptions  Medication Sig Dispense Refill  . acetaminophen (TYLENOL) 500 MG tablet Take 1,000 mg by mouth every 6 (six) hours as needed for pain or fever.    Marland Kitchen albuterol (PROAIR HFA) 108 (90 BASE) MCG/ACT inhaler Inhale 1-2 puffs into the lungs every 6 (six) hours as needed for shortness of breath.     Marland Kitchen atorvastatin (LIPITOR) 40 MG tablet Take 1 tablet (40 mg total) by mouth at bedtime. 90 tablet 3  . calcium acetate (PHOSLO) 667 MG capsule Take 4,002 mg by mouth 3 (three) times daily with meals.     Marland Kitchen  citalopram (CELEXA) 20 MG tablet Take 20 mg by mouth daily.    . clopidogrel (PLAVIX) 75 MG tablet Take 1 tablet (75 mg total) by mouth daily with breakfast. 90 tablet 3  . docusate sodium (COLACE) 100 MG capsule Take 400 mg by mouth 2 (two) times daily.    . folic acid (FOLVITE) 1 MG tablet Take 1 mg by mouth daily.    Marland Kitchen gabapentin (NEURONTIN) 300 MG capsule Take 300 mg by mouth at bedtime.     . metoprolol tartrate (LOPRESSOR) 25 MG tablet Take 1 tablet (25 mg total) by mouth 2 (two) times daily. 60 tablet 5  . multivitamin (RENA-VIT) TABS tablet Take 1 tablet by mouth daily. 30 tablet 1  . nitroGLYCERIN (NITROSTAT) 0.4 MG SL tablet Place 1 tablet (0.4 mg total) under the tongue every 5 (five) minutes as needed for chest pain (Take one tabe every 5-10 minutes for chest pain. Once you have taken three tabs you need to notify your MD or go to ER if chest pain not gone.). 30 tablet 0  . ranitidine (ZANTAC) 150  MG tablet Take 150 mg by mouth daily.    . sodium polystyrene (KAYEXALATE) 15 GM/60ML suspension Take 15 g by mouth See admin instructions. Only take 15 g on Sun / Tues / Thurs / Sat (non dialysis days)    . warfarin (COUMADIN) 7.5 MG tablet Take 1 tablet (7.5 mg total) by mouth daily at 6 PM. 30 tablet 5   No current facility-administered medications for this visit.    Allergies:   Diphenhydramine; Penicillins; and Adhesive   Social History:  The patient  reports that he quit smoking about 16 years ago. His smoking use included Cigarettes. He has a 40 pack-year smoking history. He does not have any smokeless tobacco history on file. He reports that he does not drink alcohol or use illicit drugs.   Family History:  The patient's family history includes Diabetes in his father; Heart attack in his maternal aunt, maternal grandfather, and maternal uncle; Heart failure in his mother; Hypertension in his maternal aunt; Lupus in his sister. There is no history of Stroke.  ROS:  Please see the history of present illness. All other systems are reviewed and otherwise negative.   PHYSICAL EXAM:  VS:  BP 140/80 mmHg  Pulse 61  Ht '5\' 10"'$  (1.778 m)  Wt 186 lb (84.369 kg)  BMI 26.69 kg/m2 BMI: Body mass index is 26.69 kg/(m^2). Well nourished, well developed WM, in no acute distress HEENT: normocephalic, atraumatic Neck: no JVD or masses, + bilateral carotid bruits Cardiac:  normal S1, S2; RRR; crisp valve click, soft SEM at RUSB and apex, no rubs or gallops Lungs:  clear to auscultation bilaterally, no wheezing, rhonchi or rales Abd: soft, nontender, no hepatomegaly, + BS MS: no deformity or atrophy Ext: no edema, + LUE fistula in place, left groin cath site without hematoma, ecchymosis or bruit Skin: warm and dry, no rash Neuro:  moves all extremities spontaneously, no focal abnormalities noted, follows commands Psych: euthymic mood, full affect   EKG:  Done today shows NSR 61bpm, RSR pattern  in V1/incomplete RBBB, prior anteroseptal infarct. No acute change from previous.  Recent Labs: 02/21/2015: B Natriuretic Peptide 2505.1* 05/27/2015: BUN 37*; Creatinine, Ser 7.24*; Hemoglobin 11.6*; Platelets 139*; Potassium 5.4*; Sodium 135  No results found for requested labs within last 365 days.   Estimated Creatinine Clearance: 11.2 mL/min (by C-G formula based on Cr of 7.24).   Wt  Readings from Last 3 Encounters:  06/05/15 186 lb (84.369 kg)  05/26/15 184 lb 1.4 oz (83.5 kg)  04/08/15 182 lb 12.8 oz (82.918 kg)     Other studies reviewed: Additional studies/records reviewed today include: summarized above  ASSESSMENT AND PLAN:  1. Paroxysmal atrial fibrillation/flutter/SVT - no further recurrence. The patient reports he's feeling better ever since they raised his dry weight at dialysis. If he does have recurrent AF, would probably refer to EP to discuss antiarrhythmic options. He is now on Coumadin and being followed in our Coumadin clinic. As he is also on Plavix, we discussed careful observation for bleeding complications. He was also instructed to follow with his PCP for monitoring of mild anemia. I do feel he would benefit from updated TSH - he lives in [place] and prefers to have this drawn at PCP. I wrote down the names of the lab tests he should have so that he can discuss with PCP (namely TSH, lipids, LFTs). 2. CAD as above with PCI 01/2015 and recent NSTEMI - continue Plavix. Not on ASA due to concomitant Coumadin. Ultimate duration of Plavix will be at discretion of primary cardiologist. Continue BB and statin. He is not really sure who is following his lipids. As above, he would like to have these drawn at PCP due to proximity to his home - I recommended they draw a fasting lipid panel along with LFTs. They plan to send a copy to our office. 3. Valvular heart disease s/p prior bioprosthetic AVR - recent echo as above. Follow clinically. Note EF was normal on recent  echo. 4. Essential HTN - generally controlled. Per patient, no further episodes of hypotension at HD. 5. ESRD on HD - per renal.  6. Carotid bruits - may be 2/2 HD fistula but given significant CAD, will obtain carotid dopplers.  Disposition: F/u with Dr. Angelena Form as scheduled 07/2015.  Current medicines are reviewed at length with the patient today.  The patient did not have any concerns regarding medicines.  Raechel Ache PA-C 06/05/2015 10:04 AM     [facility] [address] [place] [id number] [phone] (office)  [phone] (fax)

## 2015-06-05 ENCOUNTER — Encounter: Payer: Self-pay | Admitting: Physician Assistant

## 2015-06-05 ENCOUNTER — Ambulatory Visit (INDEPENDENT_AMBULATORY_CARE_PROVIDER_SITE_OTHER): Payer: Medicare Other | Admitting: Physician Assistant

## 2015-06-05 ENCOUNTER — Ambulatory Visit (INDEPENDENT_AMBULATORY_CARE_PROVIDER_SITE_OTHER): Payer: Medicare Other

## 2015-06-05 VITALS — BP 140/80 | HR 61 | Ht 70.0 in | Wt 186.0 lb

## 2015-06-05 DIAGNOSIS — I10 Essential (primary) hypertension: Secondary | ICD-10-CM

## 2015-06-05 DIAGNOSIS — I214 Non-ST elevation (NSTEMI) myocardial infarction: Secondary | ICD-10-CM

## 2015-06-05 DIAGNOSIS — Z5181 Encounter for therapeutic drug level monitoring: Secondary | ICD-10-CM | POA: Diagnosis not present

## 2015-06-05 DIAGNOSIS — I429 Cardiomyopathy, unspecified: Secondary | ICD-10-CM | POA: Diagnosis not present

## 2015-06-05 DIAGNOSIS — N186 End stage renal disease: Secondary | ICD-10-CM

## 2015-06-05 DIAGNOSIS — I222 Subsequent non-ST elevation (NSTEMI) myocardial infarction: Secondary | ICD-10-CM

## 2015-06-05 DIAGNOSIS — I4891 Unspecified atrial fibrillation: Secondary | ICD-10-CM

## 2015-06-05 DIAGNOSIS — I480 Paroxysmal atrial fibrillation: Secondary | ICD-10-CM

## 2015-06-05 DIAGNOSIS — I471 Supraventricular tachycardia, unspecified: Secondary | ICD-10-CM

## 2015-06-05 DIAGNOSIS — Z992 Dependence on renal dialysis: Secondary | ICD-10-CM

## 2015-06-05 DIAGNOSIS — Z952 Presence of prosthetic heart valve: Secondary | ICD-10-CM

## 2015-06-05 DIAGNOSIS — I25118 Atherosclerotic heart disease of native coronary artery with other forms of angina pectoris: Secondary | ICD-10-CM | POA: Diagnosis not present

## 2015-06-05 DIAGNOSIS — I4892 Unspecified atrial flutter: Secondary | ICD-10-CM

## 2015-06-05 DIAGNOSIS — Z954 Presence of other heart-valve replacement: Secondary | ICD-10-CM

## 2015-06-05 DIAGNOSIS — R0989 Other specified symptoms and signs involving the circulatory and respiratory systems: Secondary | ICD-10-CM

## 2015-06-05 LAB — POCT INR: INR: 2.9

## 2015-06-05 NOTE — Patient Instructions (Addendum)
Medication Instructions:  Your physician recommends that you continue on your current medications as directed. Please refer to the Current Medication list given to you today.   Labwork: None ordered  Testing/Procedures: Your physician has requested that you have a carotid duplex. This test is an ultrasound of the carotid arteries in your neck. It looks at blood flow through these arteries that supply the brain with blood. Allow one hour for this exam. There are no restrictions or special instructions.    Follow-Up: Your physician wants you to follow-up in:  [place] 08-06-15 WITH DR. Angelena Form.  Any Other Special Instructions Will Be Listed Below (If Applicable). Please have your Primary Care Physician draw the following labs: FASTING LIPID PANEL LIVER FUNCTION TEST THYROID FUNCTION

## 2015-06-05 NOTE — Patient Instructions (Signed)

## 2015-06-06 DIAGNOSIS — Z992 Dependence on renal dialysis: Secondary | ICD-10-CM | POA: Diagnosis not present

## 2015-06-06 DIAGNOSIS — N186 End stage renal disease: Secondary | ICD-10-CM | POA: Diagnosis not present

## 2015-06-06 DIAGNOSIS — T8612 Kidney transplant failure: Secondary | ICD-10-CM | POA: Diagnosis not present

## 2015-06-09 DIAGNOSIS — Z23 Encounter for immunization: Secondary | ICD-10-CM | POA: Diagnosis not present

## 2015-06-09 DIAGNOSIS — D509 Iron deficiency anemia, unspecified: Secondary | ICD-10-CM | POA: Diagnosis not present

## 2015-06-09 DIAGNOSIS — D631 Anemia in chronic kidney disease: Secondary | ICD-10-CM | POA: Diagnosis not present

## 2015-06-09 DIAGNOSIS — N186 End stage renal disease: Secondary | ICD-10-CM | POA: Diagnosis not present

## 2015-06-09 DIAGNOSIS — E875 Hyperkalemia: Secondary | ICD-10-CM | POA: Diagnosis not present

## 2015-06-11 DIAGNOSIS — D631 Anemia in chronic kidney disease: Secondary | ICD-10-CM | POA: Diagnosis not present

## 2015-06-11 DIAGNOSIS — E875 Hyperkalemia: Secondary | ICD-10-CM | POA: Diagnosis not present

## 2015-06-11 DIAGNOSIS — N186 End stage renal disease: Secondary | ICD-10-CM | POA: Diagnosis not present

## 2015-06-11 DIAGNOSIS — Z23 Encounter for immunization: Secondary | ICD-10-CM | POA: Diagnosis not present

## 2015-06-11 DIAGNOSIS — D509 Iron deficiency anemia, unspecified: Secondary | ICD-10-CM | POA: Diagnosis not present

## 2015-06-13 ENCOUNTER — Ambulatory Visit (INDEPENDENT_AMBULATORY_CARE_PROVIDER_SITE_OTHER): Payer: Medicare Other | Admitting: Pharmacist Clinician (PhC)/ Clinical Pharmacy Specialist

## 2015-06-13 ENCOUNTER — Ambulatory Visit (HOSPITAL_COMMUNITY)
Admission: RE | Admit: 2015-06-13 | Discharge: 2015-06-13 | Disposition: A | Discharge status: Home / Self Care | Payer: Medicare Other | Source: Ambulatory Visit | Attending: Cardiovascular Disease | Admitting: Cardiovascular Disease

## 2015-06-13 DIAGNOSIS — I4892 Unspecified atrial flutter: Secondary | ICD-10-CM

## 2015-06-13 DIAGNOSIS — Z992 Dependence on renal dialysis: Secondary | ICD-10-CM | POA: Diagnosis not present

## 2015-06-13 DIAGNOSIS — I4891 Unspecified atrial fibrillation: Secondary | ICD-10-CM | POA: Diagnosis not present

## 2015-06-13 DIAGNOSIS — I120 Hypertensive chronic kidney disease with stage 5 chronic kidney disease or end stage renal disease: Secondary | ICD-10-CM | POA: Insufficient documentation

## 2015-06-13 DIAGNOSIS — Z5181 Encounter for therapeutic drug level monitoring: Secondary | ICD-10-CM | POA: Diagnosis not present

## 2015-06-13 DIAGNOSIS — N186 End stage renal disease: Secondary | ICD-10-CM | POA: Insufficient documentation

## 2015-06-13 DIAGNOSIS — R0989 Other specified symptoms and signs involving the circulatory and respiratory systems: Secondary | ICD-10-CM | POA: Insufficient documentation

## 2015-06-13 DIAGNOSIS — Z23 Encounter for immunization: Secondary | ICD-10-CM | POA: Diagnosis not present

## 2015-06-13 DIAGNOSIS — I6523 Occlusion and stenosis of bilateral carotid arteries: Secondary | ICD-10-CM | POA: Diagnosis not present

## 2015-06-13 DIAGNOSIS — E875 Hyperkalemia: Secondary | ICD-10-CM | POA: Diagnosis not present

## 2015-06-13 DIAGNOSIS — D509 Iron deficiency anemia, unspecified: Secondary | ICD-10-CM | POA: Diagnosis not present

## 2015-06-13 DIAGNOSIS — D631 Anemia in chronic kidney disease: Secondary | ICD-10-CM | POA: Diagnosis not present

## 2015-06-13 DIAGNOSIS — I214 Non-ST elevation (NSTEMI) myocardial infarction: Secondary | ICD-10-CM | POA: Diagnosis not present

## 2015-06-13 LAB — POCT INR: INR: 2.4

## 2015-06-16 DIAGNOSIS — D509 Iron deficiency anemia, unspecified: Secondary | ICD-10-CM | POA: Diagnosis not present

## 2015-06-16 DIAGNOSIS — Z23 Encounter for immunization: Secondary | ICD-10-CM | POA: Diagnosis not present

## 2015-06-16 DIAGNOSIS — N186 End stage renal disease: Secondary | ICD-10-CM | POA: Diagnosis not present

## 2015-06-16 DIAGNOSIS — E875 Hyperkalemia: Secondary | ICD-10-CM | POA: Diagnosis not present

## 2015-06-16 DIAGNOSIS — D631 Anemia in chronic kidney disease: Secondary | ICD-10-CM | POA: Diagnosis not present

## 2015-06-17 ENCOUNTER — Encounter: Payer: Self-pay | Admitting: Physician Assistant

## 2015-06-18 DIAGNOSIS — N186 End stage renal disease: Secondary | ICD-10-CM | POA: Diagnosis not present

## 2015-06-18 DIAGNOSIS — E875 Hyperkalemia: Secondary | ICD-10-CM | POA: Diagnosis not present

## 2015-06-18 DIAGNOSIS — D631 Anemia in chronic kidney disease: Secondary | ICD-10-CM | POA: Diagnosis not present

## 2015-06-18 DIAGNOSIS — D509 Iron deficiency anemia, unspecified: Secondary | ICD-10-CM | POA: Diagnosis not present

## 2015-06-18 DIAGNOSIS — Z23 Encounter for immunization: Secondary | ICD-10-CM | POA: Diagnosis not present

## 2015-06-20 DIAGNOSIS — D631 Anemia in chronic kidney disease: Secondary | ICD-10-CM | POA: Diagnosis not present

## 2015-06-20 DIAGNOSIS — Z23 Encounter for immunization: Secondary | ICD-10-CM | POA: Diagnosis not present

## 2015-06-20 DIAGNOSIS — E875 Hyperkalemia: Secondary | ICD-10-CM | POA: Diagnosis not present

## 2015-06-20 DIAGNOSIS — N186 End stage renal disease: Secondary | ICD-10-CM | POA: Diagnosis not present

## 2015-06-20 DIAGNOSIS — D509 Iron deficiency anemia, unspecified: Secondary | ICD-10-CM | POA: Diagnosis not present

## 2015-06-23 ENCOUNTER — Ambulatory Visit (INDEPENDENT_AMBULATORY_CARE_PROVIDER_SITE_OTHER): Payer: Medicare Other | Admitting: *Deleted

## 2015-06-23 DIAGNOSIS — I4891 Unspecified atrial fibrillation: Secondary | ICD-10-CM

## 2015-06-23 DIAGNOSIS — I4892 Unspecified atrial flutter: Secondary | ICD-10-CM

## 2015-06-23 DIAGNOSIS — E875 Hyperkalemia: Secondary | ICD-10-CM | POA: Diagnosis not present

## 2015-06-23 DIAGNOSIS — D631 Anemia in chronic kidney disease: Secondary | ICD-10-CM | POA: Diagnosis not present

## 2015-06-23 DIAGNOSIS — D509 Iron deficiency anemia, unspecified: Secondary | ICD-10-CM | POA: Diagnosis not present

## 2015-06-23 DIAGNOSIS — Z23 Encounter for immunization: Secondary | ICD-10-CM | POA: Diagnosis not present

## 2015-06-23 DIAGNOSIS — I214 Non-ST elevation (NSTEMI) myocardial infarction: Secondary | ICD-10-CM | POA: Diagnosis not present

## 2015-06-23 DIAGNOSIS — Z5181 Encounter for therapeutic drug level monitoring: Secondary | ICD-10-CM | POA: Diagnosis not present

## 2015-06-23 DIAGNOSIS — N186 End stage renal disease: Secondary | ICD-10-CM | POA: Diagnosis not present

## 2015-06-23 LAB — POCT INR: INR: 2.4

## 2015-06-25 DIAGNOSIS — D509 Iron deficiency anemia, unspecified: Secondary | ICD-10-CM | POA: Diagnosis not present

## 2015-06-25 DIAGNOSIS — Z23 Encounter for immunization: Secondary | ICD-10-CM | POA: Diagnosis not present

## 2015-06-25 DIAGNOSIS — D631 Anemia in chronic kidney disease: Secondary | ICD-10-CM | POA: Diagnosis not present

## 2015-06-25 DIAGNOSIS — E875 Hyperkalemia: Secondary | ICD-10-CM | POA: Diagnosis not present

## 2015-06-25 DIAGNOSIS — N186 End stage renal disease: Secondary | ICD-10-CM | POA: Diagnosis not present

## 2015-06-27 DIAGNOSIS — Z23 Encounter for immunization: Secondary | ICD-10-CM | POA: Diagnosis not present

## 2015-06-27 DIAGNOSIS — D509 Iron deficiency anemia, unspecified: Secondary | ICD-10-CM | POA: Diagnosis not present

## 2015-06-27 DIAGNOSIS — E875 Hyperkalemia: Secondary | ICD-10-CM | POA: Diagnosis not present

## 2015-06-27 DIAGNOSIS — D631 Anemia in chronic kidney disease: Secondary | ICD-10-CM | POA: Diagnosis not present

## 2015-06-27 DIAGNOSIS — N186 End stage renal disease: Secondary | ICD-10-CM | POA: Diagnosis not present

## 2015-06-30 DIAGNOSIS — E875 Hyperkalemia: Secondary | ICD-10-CM | POA: Diagnosis not present

## 2015-06-30 DIAGNOSIS — Z23 Encounter for immunization: Secondary | ICD-10-CM | POA: Diagnosis not present

## 2015-06-30 DIAGNOSIS — N186 End stage renal disease: Secondary | ICD-10-CM | POA: Diagnosis not present

## 2015-06-30 DIAGNOSIS — D631 Anemia in chronic kidney disease: Secondary | ICD-10-CM | POA: Diagnosis not present

## 2015-06-30 DIAGNOSIS — D509 Iron deficiency anemia, unspecified: Secondary | ICD-10-CM | POA: Diagnosis not present

## 2015-07-02 DIAGNOSIS — D631 Anemia in chronic kidney disease: Secondary | ICD-10-CM | POA: Diagnosis not present

## 2015-07-02 DIAGNOSIS — Z23 Encounter for immunization: Secondary | ICD-10-CM | POA: Diagnosis not present

## 2015-07-02 DIAGNOSIS — I4892 Unspecified atrial flutter: Secondary | ICD-10-CM | POA: Diagnosis not present

## 2015-07-02 DIAGNOSIS — E875 Hyperkalemia: Secondary | ICD-10-CM | POA: Diagnosis not present

## 2015-07-02 DIAGNOSIS — D509 Iron deficiency anemia, unspecified: Secondary | ICD-10-CM | POA: Diagnosis not present

## 2015-07-02 DIAGNOSIS — N186 End stage renal disease: Secondary | ICD-10-CM | POA: Diagnosis not present

## 2015-07-04 DIAGNOSIS — Z23 Encounter for immunization: Secondary | ICD-10-CM | POA: Diagnosis not present

## 2015-07-04 DIAGNOSIS — N186 End stage renal disease: Secondary | ICD-10-CM | POA: Diagnosis not present

## 2015-07-04 DIAGNOSIS — D631 Anemia in chronic kidney disease: Secondary | ICD-10-CM | POA: Diagnosis not present

## 2015-07-04 DIAGNOSIS — D509 Iron deficiency anemia, unspecified: Secondary | ICD-10-CM | POA: Diagnosis not present

## 2015-07-04 DIAGNOSIS — E875 Hyperkalemia: Secondary | ICD-10-CM | POA: Diagnosis not present

## 2015-07-07 ENCOUNTER — Ambulatory Visit (INDEPENDENT_AMBULATORY_CARE_PROVIDER_SITE_OTHER): Payer: Medicare Other | Admitting: *Deleted

## 2015-07-07 DIAGNOSIS — E875 Hyperkalemia: Secondary | ICD-10-CM | POA: Diagnosis not present

## 2015-07-07 DIAGNOSIS — D631 Anemia in chronic kidney disease: Secondary | ICD-10-CM | POA: Diagnosis not present

## 2015-07-07 DIAGNOSIS — I4892 Unspecified atrial flutter: Secondary | ICD-10-CM | POA: Diagnosis not present

## 2015-07-07 DIAGNOSIS — Z5181 Encounter for therapeutic drug level monitoring: Secondary | ICD-10-CM | POA: Diagnosis not present

## 2015-07-07 DIAGNOSIS — I214 Non-ST elevation (NSTEMI) myocardial infarction: Secondary | ICD-10-CM

## 2015-07-07 DIAGNOSIS — I4891 Unspecified atrial fibrillation: Secondary | ICD-10-CM

## 2015-07-07 DIAGNOSIS — D509 Iron deficiency anemia, unspecified: Secondary | ICD-10-CM | POA: Diagnosis not present

## 2015-07-07 DIAGNOSIS — Z992 Dependence on renal dialysis: Secondary | ICD-10-CM | POA: Diagnosis not present

## 2015-07-07 DIAGNOSIS — N186 End stage renal disease: Secondary | ICD-10-CM | POA: Diagnosis not present

## 2015-07-07 DIAGNOSIS — T8612 Kidney transplant failure: Secondary | ICD-10-CM | POA: Diagnosis not present

## 2015-07-07 DIAGNOSIS — Z23 Encounter for immunization: Secondary | ICD-10-CM | POA: Diagnosis not present

## 2015-07-07 LAB — POCT INR: INR: 2.4

## 2015-07-09 DIAGNOSIS — N2581 Secondary hyperparathyroidism of renal origin: Secondary | ICD-10-CM | POA: Diagnosis not present

## 2015-07-09 DIAGNOSIS — D631 Anemia in chronic kidney disease: Secondary | ICD-10-CM | POA: Diagnosis not present

## 2015-07-09 DIAGNOSIS — D509 Iron deficiency anemia, unspecified: Secondary | ICD-10-CM | POA: Diagnosis not present

## 2015-07-09 DIAGNOSIS — N186 End stage renal disease: Secondary | ICD-10-CM | POA: Diagnosis not present

## 2015-07-09 DIAGNOSIS — E875 Hyperkalemia: Secondary | ICD-10-CM | POA: Diagnosis not present

## 2015-07-11 DIAGNOSIS — D509 Iron deficiency anemia, unspecified: Secondary | ICD-10-CM | POA: Diagnosis not present

## 2015-07-11 DIAGNOSIS — N2581 Secondary hyperparathyroidism of renal origin: Secondary | ICD-10-CM | POA: Diagnosis not present

## 2015-07-11 DIAGNOSIS — E875 Hyperkalemia: Secondary | ICD-10-CM | POA: Diagnosis not present

## 2015-07-11 DIAGNOSIS — N186 End stage renal disease: Secondary | ICD-10-CM | POA: Diagnosis not present

## 2015-07-11 DIAGNOSIS — D631 Anemia in chronic kidney disease: Secondary | ICD-10-CM | POA: Diagnosis not present

## 2015-07-14 ENCOUNTER — Telehealth: Payer: Self-pay | Admitting: *Deleted

## 2015-07-14 DIAGNOSIS — D509 Iron deficiency anemia, unspecified: Secondary | ICD-10-CM | POA: Diagnosis not present

## 2015-07-14 DIAGNOSIS — N2581 Secondary hyperparathyroidism of renal origin: Secondary | ICD-10-CM | POA: Diagnosis not present

## 2015-07-14 DIAGNOSIS — N186 End stage renal disease: Secondary | ICD-10-CM | POA: Diagnosis not present

## 2015-07-14 DIAGNOSIS — E875 Hyperkalemia: Secondary | ICD-10-CM | POA: Diagnosis not present

## 2015-07-14 DIAGNOSIS — D631 Anemia in chronic kidney disease: Secondary | ICD-10-CM | POA: Diagnosis not present

## 2015-07-14 NOTE — Telephone Encounter (Signed)
Received message from coumadin clinic that pt's wife was requesting we send order for lab work to Dr. Arna Medici office.  I spoke with pt's wife and told her I would send orders for TSH, LFT's and lipid profiles.  Wife aware blood work is fasting. Orders faxed to [phone]

## 2015-07-15 DIAGNOSIS — E784 Other hyperlipidemia: Secondary | ICD-10-CM | POA: Diagnosis not present

## 2015-07-16 ENCOUNTER — Encounter: Payer: Self-pay | Admitting: Cardiovascular Disease

## 2015-07-16 DIAGNOSIS — D631 Anemia in chronic kidney disease: Secondary | ICD-10-CM | POA: Diagnosis not present

## 2015-07-16 DIAGNOSIS — N186 End stage renal disease: Secondary | ICD-10-CM | POA: Diagnosis not present

## 2015-07-16 DIAGNOSIS — D509 Iron deficiency anemia, unspecified: Secondary | ICD-10-CM | POA: Diagnosis not present

## 2015-07-16 DIAGNOSIS — E875 Hyperkalemia: Secondary | ICD-10-CM | POA: Diagnosis not present

## 2015-07-16 DIAGNOSIS — N2581 Secondary hyperparathyroidism of renal origin: Secondary | ICD-10-CM | POA: Diagnosis not present

## 2015-07-18 DIAGNOSIS — N2581 Secondary hyperparathyroidism of renal origin: Secondary | ICD-10-CM | POA: Diagnosis not present

## 2015-07-18 DIAGNOSIS — E875 Hyperkalemia: Secondary | ICD-10-CM | POA: Diagnosis not present

## 2015-07-18 DIAGNOSIS — D631 Anemia in chronic kidney disease: Secondary | ICD-10-CM | POA: Diagnosis not present

## 2015-07-18 DIAGNOSIS — D509 Iron deficiency anemia, unspecified: Secondary | ICD-10-CM | POA: Diagnosis not present

## 2015-07-18 DIAGNOSIS — N186 End stage renal disease: Secondary | ICD-10-CM | POA: Diagnosis not present

## 2015-07-21 DIAGNOSIS — D509 Iron deficiency anemia, unspecified: Secondary | ICD-10-CM | POA: Diagnosis not present

## 2015-07-21 DIAGNOSIS — D631 Anemia in chronic kidney disease: Secondary | ICD-10-CM | POA: Diagnosis not present

## 2015-07-21 DIAGNOSIS — E875 Hyperkalemia: Secondary | ICD-10-CM | POA: Diagnosis not present

## 2015-07-21 DIAGNOSIS — N186 End stage renal disease: Secondary | ICD-10-CM | POA: Diagnosis not present

## 2015-07-21 DIAGNOSIS — N2581 Secondary hyperparathyroidism of renal origin: Secondary | ICD-10-CM | POA: Diagnosis not present

## 2015-07-23 DIAGNOSIS — D631 Anemia in chronic kidney disease: Secondary | ICD-10-CM | POA: Diagnosis not present

## 2015-07-23 DIAGNOSIS — E875 Hyperkalemia: Secondary | ICD-10-CM | POA: Diagnosis not present

## 2015-07-23 DIAGNOSIS — N186 End stage renal disease: Secondary | ICD-10-CM | POA: Diagnosis not present

## 2015-07-23 DIAGNOSIS — D509 Iron deficiency anemia, unspecified: Secondary | ICD-10-CM | POA: Diagnosis not present

## 2015-07-23 DIAGNOSIS — N2581 Secondary hyperparathyroidism of renal origin: Secondary | ICD-10-CM | POA: Diagnosis not present

## 2015-07-25 DIAGNOSIS — D509 Iron deficiency anemia, unspecified: Secondary | ICD-10-CM | POA: Diagnosis not present

## 2015-07-25 DIAGNOSIS — N186 End stage renal disease: Secondary | ICD-10-CM | POA: Diagnosis not present

## 2015-07-25 DIAGNOSIS — N2581 Secondary hyperparathyroidism of renal origin: Secondary | ICD-10-CM | POA: Diagnosis not present

## 2015-07-25 DIAGNOSIS — E875 Hyperkalemia: Secondary | ICD-10-CM | POA: Diagnosis not present

## 2015-07-25 DIAGNOSIS — D631 Anemia in chronic kidney disease: Secondary | ICD-10-CM | POA: Diagnosis not present

## 2015-07-28 ENCOUNTER — Ambulatory Visit (INDEPENDENT_AMBULATORY_CARE_PROVIDER_SITE_OTHER): Payer: Medicare Other | Admitting: *Deleted

## 2015-07-28 DIAGNOSIS — D509 Iron deficiency anemia, unspecified: Secondary | ICD-10-CM | POA: Diagnosis not present

## 2015-07-28 DIAGNOSIS — E875 Hyperkalemia: Secondary | ICD-10-CM | POA: Diagnosis not present

## 2015-07-28 DIAGNOSIS — Z5181 Encounter for therapeutic drug level monitoring: Secondary | ICD-10-CM

## 2015-07-28 DIAGNOSIS — N2581 Secondary hyperparathyroidism of renal origin: Secondary | ICD-10-CM | POA: Diagnosis not present

## 2015-07-28 DIAGNOSIS — I4892 Unspecified atrial flutter: Secondary | ICD-10-CM

## 2015-07-28 DIAGNOSIS — N186 End stage renal disease: Secondary | ICD-10-CM | POA: Diagnosis not present

## 2015-07-28 DIAGNOSIS — I214 Non-ST elevation (NSTEMI) myocardial infarction: Secondary | ICD-10-CM | POA: Diagnosis not present

## 2015-07-28 DIAGNOSIS — D631 Anemia in chronic kidney disease: Secondary | ICD-10-CM | POA: Diagnosis not present

## 2015-07-28 DIAGNOSIS — I4891 Unspecified atrial fibrillation: Secondary | ICD-10-CM

## 2015-07-28 LAB — POCT INR: INR: 2.1

## 2015-07-30 DIAGNOSIS — N186 End stage renal disease: Secondary | ICD-10-CM | POA: Diagnosis not present

## 2015-07-30 DIAGNOSIS — I4892 Unspecified atrial flutter: Secondary | ICD-10-CM | POA: Diagnosis not present

## 2015-07-30 DIAGNOSIS — D509 Iron deficiency anemia, unspecified: Secondary | ICD-10-CM | POA: Diagnosis not present

## 2015-07-30 DIAGNOSIS — N2581 Secondary hyperparathyroidism of renal origin: Secondary | ICD-10-CM | POA: Diagnosis not present

## 2015-07-30 DIAGNOSIS — E875 Hyperkalemia: Secondary | ICD-10-CM | POA: Diagnosis not present

## 2015-07-30 DIAGNOSIS — D631 Anemia in chronic kidney disease: Secondary | ICD-10-CM | POA: Diagnosis not present

## 2015-08-01 DIAGNOSIS — E875 Hyperkalemia: Secondary | ICD-10-CM | POA: Diagnosis not present

## 2015-08-01 DIAGNOSIS — D631 Anemia in chronic kidney disease: Secondary | ICD-10-CM | POA: Diagnosis not present

## 2015-08-01 DIAGNOSIS — N2581 Secondary hyperparathyroidism of renal origin: Secondary | ICD-10-CM | POA: Diagnosis not present

## 2015-08-01 DIAGNOSIS — N186 End stage renal disease: Secondary | ICD-10-CM | POA: Diagnosis not present

## 2015-08-01 DIAGNOSIS — D509 Iron deficiency anemia, unspecified: Secondary | ICD-10-CM | POA: Diagnosis not present

## 2015-08-04 DIAGNOSIS — N2581 Secondary hyperparathyroidism of renal origin: Secondary | ICD-10-CM | POA: Diagnosis not present

## 2015-08-04 DIAGNOSIS — D509 Iron deficiency anemia, unspecified: Secondary | ICD-10-CM | POA: Diagnosis not present

## 2015-08-04 DIAGNOSIS — E875 Hyperkalemia: Secondary | ICD-10-CM | POA: Diagnosis not present

## 2015-08-04 DIAGNOSIS — N186 End stage renal disease: Secondary | ICD-10-CM | POA: Diagnosis not present

## 2015-08-04 DIAGNOSIS — D631 Anemia in chronic kidney disease: Secondary | ICD-10-CM | POA: Diagnosis not present

## 2015-08-05 NOTE — Progress Notes (Signed)
Chief Complaint  Patient presents with  . 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 I met him in 2014 when I performed his heart cath at [facility] following cardiac arrest after a VATS for lung cancer. He was found to have moderate CAD at that time. He failed medical management and then underwent CABG and AVR at [facility]. He had been followed at [facility] since then. In May 2016, he was admitted for a NSTEMI at [facility] and underwent PCI of the left circumflex (Resolute drug-eluting stent) and SVG to LAD (Herculink bare metal stent). Echocardiogram done prior to recent admission in 04/2015 showed an EF of 62-94%, grade 1 diastolic dysfunction, mild to moderate aortic regurg and mild MS/MR. Per notes he has prior history of PAF during HD but very short lived. He was admitted to [facility] 9/14-9/20/16, prompted by development of recurrent symptomatic atrial arrhythmias during HD session. During that admission, he developed SVT during HD and on arrival to ER was in atrial flutter with variable block (appeared to be atypical flutter) with RVR and converted spontaneously to NSR.He apparently also had paroxysms of atrial fib during dialysis sessions as well. He ruled in for NSTEMI with peak troponin 1.54 and underwent a nuclear stress test that was abnormal with apical ischemia. He underwent LHC 05/26/15 which showed 100% mid LAD stenosis, 99% Diagonal stenosis, patent SVG to LAD with ostial 40% stenosis, 65% mid Circumflex stent restenosis with FFR of 0.87 in the Circumflex. With the nonischemic FFR of the circumflex lesion, it was felt that this  was not a flow-limiting lesion that could explain his high risk stress test. This is the distribution that his stress test showed positivity during his non-STEMI in May. It was felt possible that this is simply infarct with peri-infarct ischemia in the setting of atrial fibrillation. It was also felt that his low EF by stress test was due to possible nuclear gating error. No coronary intervention was required. Continued medical therapy was recommended. Echo 05/27/15: EF 55-60%, no RWMA, mild AS/mild AI, mod MS, severely dilated LA.  The patient presents for follow-up today and is doing well. He denies any recurrent CP, SOB, or palpitations. His energy level is normal.   Primary Care Physician: Teressa Lower   Past Medical History  Diagnosis Date  . 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 04/2013: s/p CABG x 2 (Y SVG -LAD & D2). b. 01/2015: NSTEMI s/p DES to LCx and BMS to SVG-LAD. c. NSTEMI 05/2015 in setting of atrial arrhythmias - cath without indication for PCI.  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 Peripheral vascular disease ([place])     Cath in 01/2015 required 45 cm Dest[place]  . Sleep apnea     a. intolerant of bipap.  Marland Kitchen PAF (paroxysmal atrial fibrillation) ([place])   . Multiple sclerosis ([place])   . Renal transplant failure and rejection   .  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 b. He had a renal Tx from 2008 to 2011, but subsequent rejection - Gets HD in [place], [place] on MWF schedule.    Marland Kitchen Hypertension   . Multiple sclerosis ([place])   . SVT (supraventricular tachycardia) ([place])   . Paroxysmal atrial flutter ([place])     a. During 05/2015 admission - SVT, atrial flutter, and PAF.  Marland Kitchen Anemia   . Valvular heart disease     a. 2D echo 05/2015: EF 55-60%, no RWMA, mild AS/mild AI, mod MS, severely dilated LA.  . Carotid artery  disease ([place])     a. 1-39% stenosis bilaterally in 06/2015.     Past Surgical History  Procedure Laterality Date  . Av fistula placement    . Flash      flash pulmonary edema  . Kidney transplant  08/2007  . Hernia repair  07/2011  . Excised squamous cells at rectum  2006  . Left heart catheterization with coronary angiogram N/A 01/09/2013    Procedure: LEFT HEART CATHETERIZATION WITH CORONARY ANGIOGRAM;  Surgeon: Burnell Blanks, MD;  Location: [facility] CATH LAB;  Service: Cardiovascular;  Laterality: N/A;  . Cardiac catheterization N/A 05/26/2015    Procedure: Left Heart Cath and Coronary Angiography;  Surgeon: Leonie Man, MD;  Location: [facility] CV LAB;  Service: Cardiovascular;  Laterality: N/A;  . Cardiac catheterization N/A 05/26/2015    Procedure: Intravascular Pressure Wire/FFR Study;  Surgeon: Leonie Man, MD;  Location: [place] CV LAB;  Service: Cardiovascular;  Laterality: N/A;    Current Outpatient Prescriptions  Medication Sig Dispense Refill  . acetaminophen (TYLENOL) 500 MG tablet Take 1,000 mg by mouth every 6 (six) hours as needed for pain or fever.    Marland Kitchen albuterol (PROAIR HFA) 108 (90 BASE) MCG/ACT inhaler Inhale 1-2 puffs into the lungs every 6 (six) hours as needed for shortness of breath.     Marland Kitchen atorvastatin (LIPITOR) 40 MG tablet Take 1 tablet (40 mg total) by mouth at bedtime. 90 tablet 3  . azithromycin (ZITHROMAX) 250 MG tablet Take 2 tablets on day 1, then take one tablet for 4 days  0  . calcium acetate (PHOSLO) 667 MG capsule Take 4,002 mg by mouth 3 (three) times daily with meals.     . citalopram (CELEXA) 20 MG tablet Take 20 mg by mouth daily.    . clopidogrel (PLAVIX) 75 MG tablet Take 1 tablet (75 mg total) by mouth daily with breakfast. 90 tablet 3  . docusate sodium (COLACE) 100 MG capsule Take 400 mg by mouth 2 (two) times daily.    . folic acid (FOLVITE) 1 MG tablet Take 1 mg by mouth daily.    Marland Kitchen gabapentin (NEURONTIN) 300 MG capsule Take  300 mg by mouth at bedtime.     . metoprolol tartrate (LOPRESSOR) 25 MG tablet Take 25 mg by mouth 2 (two) times daily.     . multivitamin (RENA-VIT) TABS tablet Take 1 tablet by mouth daily. 30 tablet 1  . nitroGLYCERIN (NITROSTAT) 0.4 MG SL tablet Place 1 tablet (0.4 mg total) under the tongue every 5 (five) minutes as needed for chest pain (Take one tabe every 5-10 minutes for chest pain. Once you have taken three tabs you need to notify your MD or go to ER if chest pain not gone.). 30 tablet 0  . ranitidine (ZANTAC) 150 MG tablet Take 150 mg by mouth  daily.    . sodium polystyrene (KAYEXALATE) 15 GM/60ML suspension Take 15 g by mouth See admin instructions. Only take 15 g on Sun / Tues / Thurs / Sat (non dialysis days)    . warfarin (COUMADIN) 7.5 MG tablet Take 1 tablet (7.5 mg total) by mouth daily at 6 PM. 30 tablet 5   No current facility-administered medications for this visit.    Allergies  Allergen Reactions  . Diphenhydramine Itching    Only with IV doses. Tolerates oral.  . Penicillins Other (See Comments)    migraine  . Adhesive [Tape] Rash    Please use paper tape    Social History   Social History  . Marital Status: Married    Spouse Name: N/A  . Number of Children: N/A  . Years of Education: N/A   Occupational History  . Disabled    Social History Main Topics  . Smoking status: Former Smoker -- 2.00 packs/day for 20 years    Types: Cigarettes    Quit date: 08/06/1998  . Smokeless tobacco: Not on file  . Alcohol Use: No  . Drug Use: No  . Sexual Activity: Not on file   Other Topics Concern  . Not on file   Social History Narrative   No history of premature CAD in either parents or siblings. However, his maternal grandfather and 2 maternal uncles had coronary artery disease.    Family History  Problem Relation Age of Onset  . Diabetes Father   . Heart failure Mother     started in 85s  . Lupus Sister   . Heart attack Maternal Grandfather   . Heart  attack Maternal Uncle   . Heart attack Maternal Aunt   . Hypertension Maternal Aunt   . Stroke Neg Hx     Review of Systems:  As stated in the HPI and otherwise negative.   BP 110/62 mmHg  Pulse 64  Ht _0  (1.778 m)  Wt 185 lb (83.915 kg)  BMI 26.54 kg/m2  SpO2 96%  Physical Examination: General: Well developed, well nourished, NAD HEENT: OP clear, mucus membranes moist SKIN: warm, dry. No rashes. Neuro: No focal deficits Musculoskeletal: Muscle strength 5/5 all ext Psychiatric: Mood and affect normal Neck: No JVD, no carotid bruits, no thyromegaly, no lymphadenopathy. Lungs:Clear bilaterally, no wheezes, rhonci, crackles Cardiovascular: Regular rate and rhythm. No murmurs, gallops or rubs. Abdomen:Soft. Bowel sounds present. Non-tender.  Extremities: No lower extremity edema. Pulses are 2 + in the bilateral DP/PT.  EKG:  EKG is not ordered today. The ekg ordered today demonstrates   Recent Labs: 02/21/2015: B Natriuretic Peptide 2505.1* 05/27/2015: BUN 37*; Creatinine, Ser 7.24*; Hemoglobin 11.6*; Platelets 139*; Potassium 5.4*; Sodium 135   Lipid Panel No results found for: CHOL, TRIG, HDL, CHOLHDL, VLDL, LDLCALC, LDLDIRECT   Wt Readings from Last 3 Encounters:  08/06/15 185 lb (83.915 kg)  06/05/15 186 lb (84.369 kg)  05/26/15 184 lb 1.4 oz (83.5 kg)     Other studies Reviewed: Additional studies/ records that were reviewed today include: . Review of the above records demonstrates:    Assessment and Plan:   1. Atrial fibrillation: Sinus today. Continue coumadin for anti-coagulation. Continue Lopressor for now.   2. CAD: Stable. He is s/p CABG and recent PCI may 2016 at [facility]. Most recent cath September 2016 at [facility] with stable CAD. Continue Plavix, beta blocker and statin. He is now using Lipitor 20 mg daily due to muscle stiffness. No ASA due to  use of coumadin along with Plavix.   3. Aortic valve disease: s/p bioprosthetic AVR. Mild AI/AS by echo September  2016. He will continue to use antibiotic prophylaxis before dental visits.   4. Mitral stenosis: moderate by echo September 2016. Follow  5. HTN: BP well controlled. No changes.   6. ESRD: on HD  7. Carotid artery disease: Mild bilateral plaque by dopplers October 2016.   Current medicines are reviewed at length with the patient today.  The patient does not have concerns regarding medicines.  The following changes have been made:  no change  Labs/ tests ordered today include:  No orders of the defined types were placed in this encounter.    Disposition:   FU with me in 6 months  Signed, Lauree Chandler, MD 08/06/2015 4:41 PM    [facility] [address], [place], [place]  [address] Phone: [phone]; Fax: [phone]

## 2015-08-06 ENCOUNTER — Ambulatory Visit (INDEPENDENT_AMBULATORY_CARE_PROVIDER_SITE_OTHER): Payer: Medicare Other | Admitting: Cardiovascular Disease

## 2015-08-06 ENCOUNTER — Encounter: Payer: Self-pay | Admitting: Cardiovascular Disease

## 2015-08-06 VITALS — BP 110/62 | HR 64 | Ht 70.0 in | Wt 185.0 lb

## 2015-08-06 DIAGNOSIS — I25810 Atherosclerosis of coronary artery bypass graft(s) without angina pectoris: Secondary | ICD-10-CM | POA: Diagnosis not present

## 2015-08-06 DIAGNOSIS — D509 Iron deficiency anemia, unspecified: Secondary | ICD-10-CM | POA: Diagnosis not present

## 2015-08-06 DIAGNOSIS — Z954 Presence of other heart-valve replacement: Secondary | ICD-10-CM

## 2015-08-06 DIAGNOSIS — N2581 Secondary hyperparathyroidism of renal origin: Secondary | ICD-10-CM | POA: Diagnosis not present

## 2015-08-06 DIAGNOSIS — T8612 Kidney transplant failure: Secondary | ICD-10-CM | POA: Diagnosis not present

## 2015-08-06 DIAGNOSIS — Z992 Dependence on renal dialysis: Secondary | ICD-10-CM

## 2015-08-06 DIAGNOSIS — I480 Paroxysmal atrial fibrillation: Secondary | ICD-10-CM

## 2015-08-06 DIAGNOSIS — N186 End stage renal disease: Secondary | ICD-10-CM | POA: Diagnosis not present

## 2015-08-06 DIAGNOSIS — D631 Anemia in chronic kidney disease: Secondary | ICD-10-CM | POA: Diagnosis not present

## 2015-08-06 DIAGNOSIS — I779 Disorder of arteries and arterioles, unspecified: Secondary | ICD-10-CM

## 2015-08-06 DIAGNOSIS — I739 Peripheral vascular disease, unspecified: Secondary | ICD-10-CM

## 2015-08-06 DIAGNOSIS — Z952 Presence of prosthetic heart valve: Secondary | ICD-10-CM

## 2015-08-06 DIAGNOSIS — I10 Essential (primary) hypertension: Secondary | ICD-10-CM

## 2015-08-06 DIAGNOSIS — E875 Hyperkalemia: Secondary | ICD-10-CM | POA: Diagnosis not present

## 2015-08-06 NOTE — Patient Instructions (Signed)

## 2015-08-08 DIAGNOSIS — N2581 Secondary hyperparathyroidism of renal origin: Secondary | ICD-10-CM | POA: Diagnosis not present

## 2015-08-08 DIAGNOSIS — E875 Hyperkalemia: Secondary | ICD-10-CM | POA: Diagnosis not present

## 2015-08-08 DIAGNOSIS — N186 End stage renal disease: Secondary | ICD-10-CM | POA: Diagnosis not present

## 2015-08-08 DIAGNOSIS — D509 Iron deficiency anemia, unspecified: Secondary | ICD-10-CM | POA: Diagnosis not present

## 2015-08-22 ENCOUNTER — Ambulatory Visit (INDEPENDENT_AMBULATORY_CARE_PROVIDER_SITE_OTHER): Payer: Medicare Other | Admitting: *Deleted

## 2015-08-22 DIAGNOSIS — I4892 Unspecified atrial flutter: Secondary | ICD-10-CM | POA: Diagnosis not present

## 2015-08-22 DIAGNOSIS — I214 Non-ST elevation (NSTEMI) myocardial infarction: Secondary | ICD-10-CM

## 2015-08-22 DIAGNOSIS — I4891 Unspecified atrial fibrillation: Secondary | ICD-10-CM | POA: Diagnosis not present

## 2015-08-22 DIAGNOSIS — Z5181 Encounter for therapeutic drug level monitoring: Secondary | ICD-10-CM | POA: Diagnosis not present

## 2015-08-22 LAB — POCT INR: INR: 3.7

## 2015-08-27 DIAGNOSIS — I4892 Unspecified atrial flutter: Secondary | ICD-10-CM | POA: Diagnosis not present

## 2015-09-05 ENCOUNTER — Ambulatory Visit (INDEPENDENT_AMBULATORY_CARE_PROVIDER_SITE_OTHER): Payer: Medicare Other | Admitting: Pharmacist

## 2015-09-05 DIAGNOSIS — I4891 Unspecified atrial fibrillation: Secondary | ICD-10-CM | POA: Diagnosis not present

## 2015-09-05 DIAGNOSIS — I214 Non-ST elevation (NSTEMI) myocardial infarction: Secondary | ICD-10-CM | POA: Diagnosis not present

## 2015-09-05 DIAGNOSIS — Z5181 Encounter for therapeutic drug level monitoring: Secondary | ICD-10-CM | POA: Diagnosis not present

## 2015-09-05 DIAGNOSIS — I4892 Unspecified atrial flutter: Secondary | ICD-10-CM

## 2015-09-05 LAB — POCT INR: INR: 2.1

## 2015-09-06 DIAGNOSIS — T8612 Kidney transplant failure: Secondary | ICD-10-CM | POA: Diagnosis not present

## 2015-09-06 DIAGNOSIS — Z992 Dependence on renal dialysis: Secondary | ICD-10-CM | POA: Diagnosis not present

## 2015-09-06 DIAGNOSIS — N186 End stage renal disease: Secondary | ICD-10-CM | POA: Diagnosis not present

## 2015-09-08 DIAGNOSIS — N186 End stage renal disease: Secondary | ICD-10-CM | POA: Diagnosis not present

## 2015-09-08 DIAGNOSIS — E875 Hyperkalemia: Secondary | ICD-10-CM | POA: Diagnosis not present

## 2015-09-08 DIAGNOSIS — N2581 Secondary hyperparathyroidism of renal origin: Secondary | ICD-10-CM | POA: Diagnosis not present

## 2015-09-08 DIAGNOSIS — D509 Iron deficiency anemia, unspecified: Secondary | ICD-10-CM | POA: Diagnosis not present

## 2015-10-01 DIAGNOSIS — I4892 Unspecified atrial flutter: Secondary | ICD-10-CM | POA: Diagnosis not present

## 2015-10-03 ENCOUNTER — Ambulatory Visit (INDEPENDENT_AMBULATORY_CARE_PROVIDER_SITE_OTHER): Payer: Medicare Other | Admitting: *Deleted

## 2015-10-03 DIAGNOSIS — I4892 Unspecified atrial flutter: Secondary | ICD-10-CM | POA: Diagnosis not present

## 2015-10-03 DIAGNOSIS — I4891 Unspecified atrial fibrillation: Secondary | ICD-10-CM | POA: Diagnosis not present

## 2015-10-03 DIAGNOSIS — Z5181 Encounter for therapeutic drug level monitoring: Secondary | ICD-10-CM

## 2015-10-03 DIAGNOSIS — I214 Non-ST elevation (NSTEMI) myocardial infarction: Secondary | ICD-10-CM

## 2015-10-03 LAB — POCT INR: INR: 1.9

## 2015-10-07 DIAGNOSIS — N186 End stage renal disease: Secondary | ICD-10-CM | POA: Diagnosis not present

## 2015-10-07 DIAGNOSIS — T8612 Kidney transplant failure: Secondary | ICD-10-CM | POA: Diagnosis not present

## 2015-10-07 DIAGNOSIS — Z992 Dependence on renal dialysis: Secondary | ICD-10-CM | POA: Diagnosis not present

## 2015-10-08 DIAGNOSIS — E875 Hyperkalemia: Secondary | ICD-10-CM | POA: Diagnosis not present

## 2015-10-08 DIAGNOSIS — N186 End stage renal disease: Secondary | ICD-10-CM | POA: Diagnosis not present

## 2015-10-08 DIAGNOSIS — D509 Iron deficiency anemia, unspecified: Secondary | ICD-10-CM | POA: Diagnosis not present

## 2015-10-08 DIAGNOSIS — N2581 Secondary hyperparathyroidism of renal origin: Secondary | ICD-10-CM | POA: Diagnosis not present

## 2015-10-09 DIAGNOSIS — K649 Unspecified hemorrhoids: Secondary | ICD-10-CM | POA: Diagnosis not present

## 2015-10-09 DIAGNOSIS — F3342 Major depressive disorder, recurrent, in full remission: Secondary | ICD-10-CM | POA: Insufficient documentation

## 2015-10-10 DIAGNOSIS — E875 Hyperkalemia: Secondary | ICD-10-CM | POA: Diagnosis not present

## 2015-10-10 DIAGNOSIS — D509 Iron deficiency anemia, unspecified: Secondary | ICD-10-CM | POA: Diagnosis not present

## 2015-10-10 DIAGNOSIS — N2581 Secondary hyperparathyroidism of renal origin: Secondary | ICD-10-CM | POA: Diagnosis not present

## 2015-10-10 DIAGNOSIS — N186 End stage renal disease: Secondary | ICD-10-CM | POA: Diagnosis not present

## 2015-10-13 DIAGNOSIS — D509 Iron deficiency anemia, unspecified: Secondary | ICD-10-CM | POA: Diagnosis not present

## 2015-10-13 DIAGNOSIS — E875 Hyperkalemia: Secondary | ICD-10-CM | POA: Diagnosis not present

## 2015-10-13 DIAGNOSIS — N186 End stage renal disease: Secondary | ICD-10-CM | POA: Diagnosis not present

## 2015-10-13 DIAGNOSIS — N2581 Secondary hyperparathyroidism of renal origin: Secondary | ICD-10-CM | POA: Diagnosis not present

## 2015-10-15 DIAGNOSIS — N186 End stage renal disease: Secondary | ICD-10-CM | POA: Diagnosis not present

## 2015-10-15 DIAGNOSIS — N2581 Secondary hyperparathyroidism of renal origin: Secondary | ICD-10-CM | POA: Diagnosis not present

## 2015-10-15 DIAGNOSIS — E875 Hyperkalemia: Secondary | ICD-10-CM | POA: Diagnosis not present

## 2015-10-15 DIAGNOSIS — D509 Iron deficiency anemia, unspecified: Secondary | ICD-10-CM | POA: Diagnosis not present

## 2015-10-17 DIAGNOSIS — N186 End stage renal disease: Secondary | ICD-10-CM | POA: Diagnosis not present

## 2015-10-17 DIAGNOSIS — D509 Iron deficiency anemia, unspecified: Secondary | ICD-10-CM | POA: Diagnosis not present

## 2015-10-17 DIAGNOSIS — N2581 Secondary hyperparathyroidism of renal origin: Secondary | ICD-10-CM | POA: Diagnosis not present

## 2015-10-17 DIAGNOSIS — E875 Hyperkalemia: Secondary | ICD-10-CM | POA: Diagnosis not present

## 2015-10-20 DIAGNOSIS — N186 End stage renal disease: Secondary | ICD-10-CM | POA: Diagnosis not present

## 2015-10-20 DIAGNOSIS — N2581 Secondary hyperparathyroidism of renal origin: Secondary | ICD-10-CM | POA: Diagnosis not present

## 2015-10-20 DIAGNOSIS — E875 Hyperkalemia: Secondary | ICD-10-CM | POA: Diagnosis not present

## 2015-10-20 DIAGNOSIS — D509 Iron deficiency anemia, unspecified: Secondary | ICD-10-CM | POA: Diagnosis not present

## 2015-10-22 DIAGNOSIS — N186 End stage renal disease: Secondary | ICD-10-CM | POA: Diagnosis not present

## 2015-10-22 DIAGNOSIS — E875 Hyperkalemia: Secondary | ICD-10-CM | POA: Diagnosis not present

## 2015-10-22 DIAGNOSIS — N2581 Secondary hyperparathyroidism of renal origin: Secondary | ICD-10-CM | POA: Diagnosis not present

## 2015-10-22 DIAGNOSIS — D509 Iron deficiency anemia, unspecified: Secondary | ICD-10-CM | POA: Diagnosis not present

## 2015-10-24 ENCOUNTER — Ambulatory Visit (INDEPENDENT_AMBULATORY_CARE_PROVIDER_SITE_OTHER): Payer: Medicare Other | Admitting: *Deleted

## 2015-10-24 DIAGNOSIS — I214 Non-ST elevation (NSTEMI) myocardial infarction: Secondary | ICD-10-CM

## 2015-10-24 DIAGNOSIS — I4891 Unspecified atrial fibrillation: Secondary | ICD-10-CM | POA: Diagnosis not present

## 2015-10-24 DIAGNOSIS — E875 Hyperkalemia: Secondary | ICD-10-CM | POA: Diagnosis not present

## 2015-10-24 DIAGNOSIS — D509 Iron deficiency anemia, unspecified: Secondary | ICD-10-CM | POA: Diagnosis not present

## 2015-10-24 DIAGNOSIS — N2581 Secondary hyperparathyroidism of renal origin: Secondary | ICD-10-CM | POA: Diagnosis not present

## 2015-10-24 DIAGNOSIS — N186 End stage renal disease: Secondary | ICD-10-CM | POA: Diagnosis not present

## 2015-10-24 DIAGNOSIS — I4892 Unspecified atrial flutter: Secondary | ICD-10-CM | POA: Diagnosis not present

## 2015-10-24 DIAGNOSIS — Z5181 Encounter for therapeutic drug level monitoring: Secondary | ICD-10-CM

## 2015-10-24 LAB — POCT INR: INR: 2.2

## 2015-10-27 ENCOUNTER — Other Ambulatory Visit: Payer: Self-pay | Admitting: Cardiology

## 2015-10-27 DIAGNOSIS — N2581 Secondary hyperparathyroidism of renal origin: Secondary | ICD-10-CM | POA: Diagnosis not present

## 2015-10-27 DIAGNOSIS — D509 Iron deficiency anemia, unspecified: Secondary | ICD-10-CM | POA: Diagnosis not present

## 2015-10-27 DIAGNOSIS — E875 Hyperkalemia: Secondary | ICD-10-CM | POA: Diagnosis not present

## 2015-10-27 DIAGNOSIS — N186 End stage renal disease: Secondary | ICD-10-CM | POA: Diagnosis not present

## 2015-10-29 DIAGNOSIS — I4892 Unspecified atrial flutter: Secondary | ICD-10-CM | POA: Diagnosis not present

## 2015-10-29 DIAGNOSIS — D509 Iron deficiency anemia, unspecified: Secondary | ICD-10-CM | POA: Diagnosis not present

## 2015-10-29 DIAGNOSIS — N2581 Secondary hyperparathyroidism of renal origin: Secondary | ICD-10-CM | POA: Diagnosis not present

## 2015-10-29 DIAGNOSIS — N186 End stage renal disease: Secondary | ICD-10-CM | POA: Diagnosis not present

## 2015-10-29 DIAGNOSIS — E875 Hyperkalemia: Secondary | ICD-10-CM | POA: Diagnosis not present

## 2015-10-31 DIAGNOSIS — N2581 Secondary hyperparathyroidism of renal origin: Secondary | ICD-10-CM | POA: Diagnosis not present

## 2015-10-31 DIAGNOSIS — D509 Iron deficiency anemia, unspecified: Secondary | ICD-10-CM | POA: Diagnosis not present

## 2015-10-31 DIAGNOSIS — N186 End stage renal disease: Secondary | ICD-10-CM | POA: Diagnosis not present

## 2015-10-31 DIAGNOSIS — E875 Hyperkalemia: Secondary | ICD-10-CM | POA: Diagnosis not present

## 2015-11-03 DIAGNOSIS — N186 End stage renal disease: Secondary | ICD-10-CM | POA: Diagnosis not present

## 2015-11-03 DIAGNOSIS — E875 Hyperkalemia: Secondary | ICD-10-CM | POA: Diagnosis not present

## 2015-11-03 DIAGNOSIS — D509 Iron deficiency anemia, unspecified: Secondary | ICD-10-CM | POA: Diagnosis not present

## 2015-11-03 DIAGNOSIS — N2581 Secondary hyperparathyroidism of renal origin: Secondary | ICD-10-CM | POA: Diagnosis not present

## 2015-11-04 DIAGNOSIS — T8612 Kidney transplant failure: Secondary | ICD-10-CM | POA: Diagnosis not present

## 2015-11-04 DIAGNOSIS — Z992 Dependence on renal dialysis: Secondary | ICD-10-CM | POA: Diagnosis not present

## 2015-11-04 DIAGNOSIS — N186 End stage renal disease: Secondary | ICD-10-CM | POA: Diagnosis not present

## 2015-11-05 DIAGNOSIS — E875 Hyperkalemia: Secondary | ICD-10-CM | POA: Diagnosis not present

## 2015-11-05 DIAGNOSIS — R6883 Chills (without fever): Secondary | ICD-10-CM | POA: Diagnosis not present

## 2015-11-05 DIAGNOSIS — N2581 Secondary hyperparathyroidism of renal origin: Secondary | ICD-10-CM | POA: Diagnosis not present

## 2015-11-05 DIAGNOSIS — N186 End stage renal disease: Secondary | ICD-10-CM | POA: Diagnosis not present

## 2015-11-05 DIAGNOSIS — D509 Iron deficiency anemia, unspecified: Secondary | ICD-10-CM | POA: Diagnosis not present

## 2015-11-07 DIAGNOSIS — N186 End stage renal disease: Secondary | ICD-10-CM | POA: Diagnosis not present

## 2015-11-07 DIAGNOSIS — N2581 Secondary hyperparathyroidism of renal origin: Secondary | ICD-10-CM | POA: Diagnosis not present

## 2015-11-07 DIAGNOSIS — E875 Hyperkalemia: Secondary | ICD-10-CM | POA: Diagnosis not present

## 2015-11-07 DIAGNOSIS — D509 Iron deficiency anemia, unspecified: Secondary | ICD-10-CM | POA: Diagnosis not present

## 2015-11-07 DIAGNOSIS — R6883 Chills (without fever): Secondary | ICD-10-CM | POA: Diagnosis not present

## 2015-11-10 DIAGNOSIS — R6883 Chills (without fever): Secondary | ICD-10-CM | POA: Diagnosis not present

## 2015-11-10 DIAGNOSIS — D509 Iron deficiency anemia, unspecified: Secondary | ICD-10-CM | POA: Diagnosis not present

## 2015-11-10 DIAGNOSIS — N2581 Secondary hyperparathyroidism of renal origin: Secondary | ICD-10-CM | POA: Diagnosis not present

## 2015-11-10 DIAGNOSIS — E875 Hyperkalemia: Secondary | ICD-10-CM | POA: Diagnosis not present

## 2015-11-10 DIAGNOSIS — N186 End stage renal disease: Secondary | ICD-10-CM | POA: Diagnosis not present

## 2015-11-12 DIAGNOSIS — N186 End stage renal disease: Secondary | ICD-10-CM | POA: Diagnosis not present

## 2015-11-12 DIAGNOSIS — D509 Iron deficiency anemia, unspecified: Secondary | ICD-10-CM | POA: Diagnosis not present

## 2015-11-12 DIAGNOSIS — E875 Hyperkalemia: Secondary | ICD-10-CM | POA: Diagnosis not present

## 2015-11-12 DIAGNOSIS — N2581 Secondary hyperparathyroidism of renal origin: Secondary | ICD-10-CM | POA: Diagnosis not present

## 2015-11-12 DIAGNOSIS — R6883 Chills (without fever): Secondary | ICD-10-CM | POA: Diagnosis not present

## 2015-11-14 DIAGNOSIS — N186 End stage renal disease: Secondary | ICD-10-CM | POA: Diagnosis not present

## 2015-11-14 DIAGNOSIS — R6883 Chills (without fever): Secondary | ICD-10-CM | POA: Diagnosis not present

## 2015-11-14 DIAGNOSIS — D509 Iron deficiency anemia, unspecified: Secondary | ICD-10-CM | POA: Diagnosis not present

## 2015-11-14 DIAGNOSIS — N2581 Secondary hyperparathyroidism of renal origin: Secondary | ICD-10-CM | POA: Diagnosis not present

## 2015-11-14 DIAGNOSIS — E875 Hyperkalemia: Secondary | ICD-10-CM | POA: Diagnosis not present

## 2015-11-17 DIAGNOSIS — R6883 Chills (without fever): Secondary | ICD-10-CM | POA: Diagnosis not present

## 2015-11-17 DIAGNOSIS — E875 Hyperkalemia: Secondary | ICD-10-CM | POA: Diagnosis not present

## 2015-11-17 DIAGNOSIS — D509 Iron deficiency anemia, unspecified: Secondary | ICD-10-CM | POA: Diagnosis not present

## 2015-11-17 DIAGNOSIS — N186 End stage renal disease: Secondary | ICD-10-CM | POA: Diagnosis not present

## 2015-11-17 DIAGNOSIS — N2581 Secondary hyperparathyroidism of renal origin: Secondary | ICD-10-CM | POA: Diagnosis not present

## 2015-11-19 DIAGNOSIS — R6883 Chills (without fever): Secondary | ICD-10-CM | POA: Diagnosis not present

## 2015-11-19 DIAGNOSIS — E875 Hyperkalemia: Secondary | ICD-10-CM | POA: Diagnosis not present

## 2015-11-19 DIAGNOSIS — N2581 Secondary hyperparathyroidism of renal origin: Secondary | ICD-10-CM | POA: Diagnosis not present

## 2015-11-19 DIAGNOSIS — N186 End stage renal disease: Secondary | ICD-10-CM | POA: Diagnosis not present

## 2015-11-19 DIAGNOSIS — D509 Iron deficiency anemia, unspecified: Secondary | ICD-10-CM | POA: Diagnosis not present

## 2015-11-21 ENCOUNTER — Ambulatory Visit (INDEPENDENT_AMBULATORY_CARE_PROVIDER_SITE_OTHER): Payer: Medicare Other

## 2015-11-21 DIAGNOSIS — I4892 Unspecified atrial flutter: Secondary | ICD-10-CM

## 2015-11-21 DIAGNOSIS — Z5181 Encounter for therapeutic drug level monitoring: Secondary | ICD-10-CM | POA: Diagnosis not present

## 2015-11-21 DIAGNOSIS — R6883 Chills (without fever): Secondary | ICD-10-CM | POA: Diagnosis not present

## 2015-11-21 DIAGNOSIS — I4891 Unspecified atrial fibrillation: Secondary | ICD-10-CM | POA: Diagnosis not present

## 2015-11-21 DIAGNOSIS — I214 Non-ST elevation (NSTEMI) myocardial infarction: Secondary | ICD-10-CM | POA: Diagnosis not present

## 2015-11-21 DIAGNOSIS — N186 End stage renal disease: Secondary | ICD-10-CM | POA: Diagnosis not present

## 2015-11-21 DIAGNOSIS — N2581 Secondary hyperparathyroidism of renal origin: Secondary | ICD-10-CM | POA: Diagnosis not present

## 2015-11-21 DIAGNOSIS — E875 Hyperkalemia: Secondary | ICD-10-CM | POA: Diagnosis not present

## 2015-11-21 DIAGNOSIS — D509 Iron deficiency anemia, unspecified: Secondary | ICD-10-CM | POA: Diagnosis not present

## 2015-11-21 LAB — POCT INR: INR: 2.6

## 2015-11-24 DIAGNOSIS — N186 End stage renal disease: Secondary | ICD-10-CM | POA: Diagnosis not present

## 2015-11-24 DIAGNOSIS — R6883 Chills (without fever): Secondary | ICD-10-CM | POA: Diagnosis not present

## 2015-11-24 DIAGNOSIS — E875 Hyperkalemia: Secondary | ICD-10-CM | POA: Diagnosis not present

## 2015-11-24 DIAGNOSIS — D509 Iron deficiency anemia, unspecified: Secondary | ICD-10-CM | POA: Diagnosis not present

## 2015-11-24 DIAGNOSIS — N2581 Secondary hyperparathyroidism of renal origin: Secondary | ICD-10-CM | POA: Diagnosis not present

## 2015-11-26 DIAGNOSIS — D509 Iron deficiency anemia, unspecified: Secondary | ICD-10-CM | POA: Diagnosis not present

## 2015-11-26 DIAGNOSIS — I4892 Unspecified atrial flutter: Secondary | ICD-10-CM | POA: Diagnosis not present

## 2015-11-26 DIAGNOSIS — E875 Hyperkalemia: Secondary | ICD-10-CM | POA: Diagnosis not present

## 2015-11-26 DIAGNOSIS — N186 End stage renal disease: Secondary | ICD-10-CM | POA: Diagnosis not present

## 2015-11-26 DIAGNOSIS — N2581 Secondary hyperparathyroidism of renal origin: Secondary | ICD-10-CM | POA: Diagnosis not present

## 2015-11-26 DIAGNOSIS — R6883 Chills (without fever): Secondary | ICD-10-CM | POA: Diagnosis not present

## 2015-11-28 DIAGNOSIS — D509 Iron deficiency anemia, unspecified: Secondary | ICD-10-CM | POA: Diagnosis not present

## 2015-11-28 DIAGNOSIS — E875 Hyperkalemia: Secondary | ICD-10-CM | POA: Diagnosis not present

## 2015-11-28 DIAGNOSIS — N2581 Secondary hyperparathyroidism of renal origin: Secondary | ICD-10-CM | POA: Diagnosis not present

## 2015-11-28 DIAGNOSIS — R6883 Chills (without fever): Secondary | ICD-10-CM | POA: Diagnosis not present

## 2015-11-28 DIAGNOSIS — N186 End stage renal disease: Secondary | ICD-10-CM | POA: Diagnosis not present

## 2015-12-01 DIAGNOSIS — N2581 Secondary hyperparathyroidism of renal origin: Secondary | ICD-10-CM | POA: Diagnosis not present

## 2015-12-01 DIAGNOSIS — R6883 Chills (without fever): Secondary | ICD-10-CM | POA: Diagnosis not present

## 2015-12-01 DIAGNOSIS — E875 Hyperkalemia: Secondary | ICD-10-CM | POA: Diagnosis not present

## 2015-12-01 DIAGNOSIS — D509 Iron deficiency anemia, unspecified: Secondary | ICD-10-CM | POA: Diagnosis not present

## 2015-12-01 DIAGNOSIS — N186 End stage renal disease: Secondary | ICD-10-CM | POA: Diagnosis not present

## 2015-12-03 DIAGNOSIS — E875 Hyperkalemia: Secondary | ICD-10-CM | POA: Diagnosis not present

## 2015-12-03 DIAGNOSIS — N2581 Secondary hyperparathyroidism of renal origin: Secondary | ICD-10-CM | POA: Diagnosis not present

## 2015-12-03 DIAGNOSIS — N186 End stage renal disease: Secondary | ICD-10-CM | POA: Diagnosis not present

## 2015-12-03 DIAGNOSIS — R6883 Chills (without fever): Secondary | ICD-10-CM | POA: Diagnosis not present

## 2015-12-03 DIAGNOSIS — D509 Iron deficiency anemia, unspecified: Secondary | ICD-10-CM | POA: Diagnosis not present

## 2015-12-05 DIAGNOSIS — T8612 Kidney transplant failure: Secondary | ICD-10-CM | POA: Diagnosis not present

## 2015-12-05 DIAGNOSIS — Z992 Dependence on renal dialysis: Secondary | ICD-10-CM | POA: Diagnosis not present

## 2015-12-05 DIAGNOSIS — D509 Iron deficiency anemia, unspecified: Secondary | ICD-10-CM | POA: Diagnosis not present

## 2015-12-05 DIAGNOSIS — N2581 Secondary hyperparathyroidism of renal origin: Secondary | ICD-10-CM | POA: Diagnosis not present

## 2015-12-05 DIAGNOSIS — E875 Hyperkalemia: Secondary | ICD-10-CM | POA: Diagnosis not present

## 2015-12-05 DIAGNOSIS — N186 End stage renal disease: Secondary | ICD-10-CM | POA: Diagnosis not present

## 2015-12-05 DIAGNOSIS — R6883 Chills (without fever): Secondary | ICD-10-CM | POA: Diagnosis not present

## 2015-12-08 DIAGNOSIS — N2581 Secondary hyperparathyroidism of renal origin: Secondary | ICD-10-CM | POA: Diagnosis not present

## 2015-12-08 DIAGNOSIS — N186 End stage renal disease: Secondary | ICD-10-CM | POA: Diagnosis not present

## 2015-12-08 DIAGNOSIS — D509 Iron deficiency anemia, unspecified: Secondary | ICD-10-CM | POA: Diagnosis not present

## 2015-12-08 DIAGNOSIS — E875 Hyperkalemia: Secondary | ICD-10-CM | POA: Diagnosis not present

## 2015-12-08 DIAGNOSIS — D631 Anemia in chronic kidney disease: Secondary | ICD-10-CM | POA: Diagnosis not present

## 2015-12-10 DIAGNOSIS — E875 Hyperkalemia: Secondary | ICD-10-CM | POA: Diagnosis not present

## 2015-12-10 DIAGNOSIS — D631 Anemia in chronic kidney disease: Secondary | ICD-10-CM | POA: Diagnosis not present

## 2015-12-10 DIAGNOSIS — N2581 Secondary hyperparathyroidism of renal origin: Secondary | ICD-10-CM | POA: Diagnosis not present

## 2015-12-10 DIAGNOSIS — N186 End stage renal disease: Secondary | ICD-10-CM | POA: Diagnosis not present

## 2015-12-10 DIAGNOSIS — D509 Iron deficiency anemia, unspecified: Secondary | ICD-10-CM | POA: Diagnosis not present

## 2015-12-12 DIAGNOSIS — E875 Hyperkalemia: Secondary | ICD-10-CM | POA: Diagnosis not present

## 2015-12-12 DIAGNOSIS — N186 End stage renal disease: Secondary | ICD-10-CM | POA: Diagnosis not present

## 2015-12-12 DIAGNOSIS — D631 Anemia in chronic kidney disease: Secondary | ICD-10-CM | POA: Diagnosis not present

## 2015-12-12 DIAGNOSIS — D509 Iron deficiency anemia, unspecified: Secondary | ICD-10-CM | POA: Diagnosis not present

## 2015-12-12 DIAGNOSIS — N2581 Secondary hyperparathyroidism of renal origin: Secondary | ICD-10-CM | POA: Diagnosis not present

## 2015-12-15 DIAGNOSIS — N186 End stage renal disease: Secondary | ICD-10-CM | POA: Diagnosis not present

## 2015-12-15 DIAGNOSIS — E875 Hyperkalemia: Secondary | ICD-10-CM | POA: Diagnosis not present

## 2015-12-15 DIAGNOSIS — D631 Anemia in chronic kidney disease: Secondary | ICD-10-CM | POA: Diagnosis not present

## 2015-12-15 DIAGNOSIS — N2581 Secondary hyperparathyroidism of renal origin: Secondary | ICD-10-CM | POA: Diagnosis not present

## 2015-12-15 DIAGNOSIS — D509 Iron deficiency anemia, unspecified: Secondary | ICD-10-CM | POA: Diagnosis not present

## 2015-12-17 DIAGNOSIS — D509 Iron deficiency anemia, unspecified: Secondary | ICD-10-CM | POA: Diagnosis not present

## 2015-12-17 DIAGNOSIS — D631 Anemia in chronic kidney disease: Secondary | ICD-10-CM | POA: Diagnosis not present

## 2015-12-17 DIAGNOSIS — N186 End stage renal disease: Secondary | ICD-10-CM | POA: Diagnosis not present

## 2015-12-17 DIAGNOSIS — E875 Hyperkalemia: Secondary | ICD-10-CM | POA: Diagnosis not present

## 2015-12-17 DIAGNOSIS — N2581 Secondary hyperparathyroidism of renal origin: Secondary | ICD-10-CM | POA: Diagnosis not present

## 2015-12-19 ENCOUNTER — Ambulatory Visit (INDEPENDENT_AMBULATORY_CARE_PROVIDER_SITE_OTHER): Payer: Medicare Other | Admitting: *Deleted

## 2015-12-19 DIAGNOSIS — N186 End stage renal disease: Secondary | ICD-10-CM | POA: Diagnosis not present

## 2015-12-19 DIAGNOSIS — I214 Non-ST elevation (NSTEMI) myocardial infarction: Secondary | ICD-10-CM

## 2015-12-19 DIAGNOSIS — I4892 Unspecified atrial flutter: Secondary | ICD-10-CM | POA: Diagnosis not present

## 2015-12-19 DIAGNOSIS — Z5181 Encounter for therapeutic drug level monitoring: Secondary | ICD-10-CM

## 2015-12-19 DIAGNOSIS — E875 Hyperkalemia: Secondary | ICD-10-CM | POA: Diagnosis not present

## 2015-12-19 DIAGNOSIS — D631 Anemia in chronic kidney disease: Secondary | ICD-10-CM | POA: Diagnosis not present

## 2015-12-19 DIAGNOSIS — I4891 Unspecified atrial fibrillation: Secondary | ICD-10-CM | POA: Diagnosis not present

## 2015-12-19 DIAGNOSIS — N2581 Secondary hyperparathyroidism of renal origin: Secondary | ICD-10-CM | POA: Diagnosis not present

## 2015-12-19 DIAGNOSIS — D509 Iron deficiency anemia, unspecified: Secondary | ICD-10-CM | POA: Diagnosis not present

## 2015-12-19 LAB — POCT INR: INR: 2.7

## 2015-12-22 DIAGNOSIS — D631 Anemia in chronic kidney disease: Secondary | ICD-10-CM | POA: Diagnosis not present

## 2015-12-22 DIAGNOSIS — D509 Iron deficiency anemia, unspecified: Secondary | ICD-10-CM | POA: Diagnosis not present

## 2015-12-22 DIAGNOSIS — N186 End stage renal disease: Secondary | ICD-10-CM | POA: Diagnosis not present

## 2015-12-22 DIAGNOSIS — E875 Hyperkalemia: Secondary | ICD-10-CM | POA: Diagnosis not present

## 2015-12-22 DIAGNOSIS — N2581 Secondary hyperparathyroidism of renal origin: Secondary | ICD-10-CM | POA: Diagnosis not present

## 2015-12-24 DIAGNOSIS — D509 Iron deficiency anemia, unspecified: Secondary | ICD-10-CM | POA: Diagnosis not present

## 2015-12-24 DIAGNOSIS — N186 End stage renal disease: Secondary | ICD-10-CM | POA: Diagnosis not present

## 2015-12-24 DIAGNOSIS — E875 Hyperkalemia: Secondary | ICD-10-CM | POA: Diagnosis not present

## 2015-12-24 DIAGNOSIS — D631 Anemia in chronic kidney disease: Secondary | ICD-10-CM | POA: Diagnosis not present

## 2015-12-24 DIAGNOSIS — N2581 Secondary hyperparathyroidism of renal origin: Secondary | ICD-10-CM | POA: Diagnosis not present

## 2015-12-25 DIAGNOSIS — Z01818 Encounter for other preprocedural examination: Secondary | ICD-10-CM | POA: Diagnosis not present

## 2015-12-25 DIAGNOSIS — J9811 Atelectasis: Secondary | ICD-10-CM | POA: Diagnosis not present

## 2015-12-26 DIAGNOSIS — N2581 Secondary hyperparathyroidism of renal origin: Secondary | ICD-10-CM | POA: Diagnosis not present

## 2015-12-26 DIAGNOSIS — D509 Iron deficiency anemia, unspecified: Secondary | ICD-10-CM | POA: Diagnosis not present

## 2015-12-26 DIAGNOSIS — E875 Hyperkalemia: Secondary | ICD-10-CM | POA: Diagnosis not present

## 2015-12-26 DIAGNOSIS — D631 Anemia in chronic kidney disease: Secondary | ICD-10-CM | POA: Diagnosis not present

## 2015-12-26 DIAGNOSIS — N186 End stage renal disease: Secondary | ICD-10-CM | POA: Diagnosis not present

## 2015-12-29 ENCOUNTER — Telehealth: Payer: Self-pay | Admitting: Cardiovascular Disease

## 2015-12-29 DIAGNOSIS — D509 Iron deficiency anemia, unspecified: Secondary | ICD-10-CM | POA: Diagnosis not present

## 2015-12-29 DIAGNOSIS — E875 Hyperkalemia: Secondary | ICD-10-CM | POA: Diagnosis not present

## 2015-12-29 DIAGNOSIS — D631 Anemia in chronic kidney disease: Secondary | ICD-10-CM | POA: Diagnosis not present

## 2015-12-29 DIAGNOSIS — N186 End stage renal disease: Secondary | ICD-10-CM | POA: Diagnosis not present

## 2015-12-29 DIAGNOSIS — N2581 Secondary hyperparathyroidism of renal origin: Secondary | ICD-10-CM | POA: Diagnosis not present

## 2015-12-29 NOTE — Telephone Encounter (Signed)
Spoke with pt's wife. She reports pt is on transplant list at [facility]. She states transplant coordinator has a question regarding Plavix and needs to discuss with Dr. Angelena Form.  Wife would like me to contact coordinator--Brandy Bobbye [place] -phone [phone].  Wife reports pt has had recent testing including a stress test at [facility].  Will contact coordinator.  Pt is due for 6 month follow up at end of May.  Appt made for him to see Richardson Dopp, PA on May 25,2017 at 8:30.  I called and spoke with Maryann Alar. She is asking about plan for Plavix.  Pt has been seen by cardiologist at [facility] but this is for transplant list. Dr. Angelena Form is primary cardiologist. I told Theadora Rama I have scheduled pt for follow up appt on May 25,2017. She states decision about Plavix does not need to be made prior to this and will fax information regarding Plavix questions to our office prior to appt on 01/29/16

## 2015-12-29 NOTE — Telephone Encounter (Signed)
Dr Camillia Herter patient, will forward

## 2015-12-29 NOTE — Telephone Encounter (Signed)
Per wife's called and  Renal Transplant [facility]  coordinator Miss Venetia Night [phone] need a call back please.  In regards to the pt's medications.  Miss Thedore Mins will be give this office a call

## 2015-12-31 DIAGNOSIS — N186 End stage renal disease: Secondary | ICD-10-CM | POA: Diagnosis not present

## 2015-12-31 DIAGNOSIS — D631 Anemia in chronic kidney disease: Secondary | ICD-10-CM | POA: Diagnosis not present

## 2015-12-31 DIAGNOSIS — D509 Iron deficiency anemia, unspecified: Secondary | ICD-10-CM | POA: Diagnosis not present

## 2015-12-31 DIAGNOSIS — N2581 Secondary hyperparathyroidism of renal origin: Secondary | ICD-10-CM | POA: Diagnosis not present

## 2015-12-31 DIAGNOSIS — E875 Hyperkalemia: Secondary | ICD-10-CM | POA: Diagnosis not present

## 2015-12-31 DIAGNOSIS — I4892 Unspecified atrial flutter: Secondary | ICD-10-CM | POA: Diagnosis not present

## 2016-01-02 DIAGNOSIS — N2581 Secondary hyperparathyroidism of renal origin: Secondary | ICD-10-CM | POA: Diagnosis not present

## 2016-01-02 DIAGNOSIS — E875 Hyperkalemia: Secondary | ICD-10-CM | POA: Diagnosis not present

## 2016-01-02 DIAGNOSIS — N186 End stage renal disease: Secondary | ICD-10-CM | POA: Diagnosis not present

## 2016-01-02 DIAGNOSIS — D631 Anemia in chronic kidney disease: Secondary | ICD-10-CM | POA: Diagnosis not present

## 2016-01-02 DIAGNOSIS — D509 Iron deficiency anemia, unspecified: Secondary | ICD-10-CM | POA: Diagnosis not present

## 2016-01-04 DIAGNOSIS — Z992 Dependence on renal dialysis: Secondary | ICD-10-CM | POA: Diagnosis not present

## 2016-01-04 DIAGNOSIS — T8612 Kidney transplant failure: Secondary | ICD-10-CM | POA: Diagnosis not present

## 2016-01-04 DIAGNOSIS — N186 End stage renal disease: Secondary | ICD-10-CM | POA: Diagnosis not present

## 2016-01-05 ENCOUNTER — Other Ambulatory Visit: Payer: Self-pay | Admitting: Cardiology

## 2016-01-05 DIAGNOSIS — D509 Iron deficiency anemia, unspecified: Secondary | ICD-10-CM | POA: Diagnosis not present

## 2016-01-05 DIAGNOSIS — E875 Hyperkalemia: Secondary | ICD-10-CM | POA: Diagnosis not present

## 2016-01-05 DIAGNOSIS — N2581 Secondary hyperparathyroidism of renal origin: Secondary | ICD-10-CM | POA: Diagnosis not present

## 2016-01-05 DIAGNOSIS — D631 Anemia in chronic kidney disease: Secondary | ICD-10-CM | POA: Diagnosis not present

## 2016-01-05 DIAGNOSIS — N186 End stage renal disease: Secondary | ICD-10-CM | POA: Diagnosis not present

## 2016-01-06 ENCOUNTER — Telehealth: Payer: Self-pay | Admitting: *Deleted

## 2016-01-06 MED ORDER — WARFARIN SODIUM 7.5 MG PO TABS: ORAL_TABLET | ORAL | Status: DC

## 2016-01-07 DIAGNOSIS — D631 Anemia in chronic kidney disease: Secondary | ICD-10-CM | POA: Diagnosis not present

## 2016-01-07 DIAGNOSIS — E875 Hyperkalemia: Secondary | ICD-10-CM | POA: Diagnosis not present

## 2016-01-07 DIAGNOSIS — D509 Iron deficiency anemia, unspecified: Secondary | ICD-10-CM | POA: Diagnosis not present

## 2016-01-07 DIAGNOSIS — N2581 Secondary hyperparathyroidism of renal origin: Secondary | ICD-10-CM | POA: Diagnosis not present

## 2016-01-07 DIAGNOSIS — N186 End stage renal disease: Secondary | ICD-10-CM | POA: Diagnosis not present

## 2016-01-09 DIAGNOSIS — D509 Iron deficiency anemia, unspecified: Secondary | ICD-10-CM | POA: Diagnosis not present

## 2016-01-09 DIAGNOSIS — D631 Anemia in chronic kidney disease: Secondary | ICD-10-CM | POA: Diagnosis not present

## 2016-01-09 DIAGNOSIS — E875 Hyperkalemia: Secondary | ICD-10-CM | POA: Diagnosis not present

## 2016-01-09 DIAGNOSIS — N2581 Secondary hyperparathyroidism of renal origin: Secondary | ICD-10-CM | POA: Diagnosis not present

## 2016-01-09 DIAGNOSIS — N186 End stage renal disease: Secondary | ICD-10-CM | POA: Diagnosis not present

## 2016-01-12 DIAGNOSIS — D631 Anemia in chronic kidney disease: Secondary | ICD-10-CM | POA: Diagnosis not present

## 2016-01-12 DIAGNOSIS — D509 Iron deficiency anemia, unspecified: Secondary | ICD-10-CM | POA: Diagnosis not present

## 2016-01-12 DIAGNOSIS — N2581 Secondary hyperparathyroidism of renal origin: Secondary | ICD-10-CM | POA: Diagnosis not present

## 2016-01-12 DIAGNOSIS — E875 Hyperkalemia: Secondary | ICD-10-CM | POA: Diagnosis not present

## 2016-01-12 DIAGNOSIS — N186 End stage renal disease: Secondary | ICD-10-CM | POA: Diagnosis not present

## 2016-01-14 DIAGNOSIS — N2581 Secondary hyperparathyroidism of renal origin: Secondary | ICD-10-CM | POA: Diagnosis not present

## 2016-01-14 DIAGNOSIS — E875 Hyperkalemia: Secondary | ICD-10-CM | POA: Diagnosis not present

## 2016-01-14 DIAGNOSIS — D509 Iron deficiency anemia, unspecified: Secondary | ICD-10-CM | POA: Diagnosis not present

## 2016-01-14 DIAGNOSIS — N186 End stage renal disease: Secondary | ICD-10-CM | POA: Diagnosis not present

## 2016-01-14 DIAGNOSIS — D631 Anemia in chronic kidney disease: Secondary | ICD-10-CM | POA: Diagnosis not present

## 2016-01-16 DIAGNOSIS — N2581 Secondary hyperparathyroidism of renal origin: Secondary | ICD-10-CM | POA: Diagnosis not present

## 2016-01-16 DIAGNOSIS — D631 Anemia in chronic kidney disease: Secondary | ICD-10-CM | POA: Diagnosis not present

## 2016-01-16 DIAGNOSIS — N186 End stage renal disease: Secondary | ICD-10-CM | POA: Diagnosis not present

## 2016-01-16 DIAGNOSIS — E875 Hyperkalemia: Secondary | ICD-10-CM | POA: Diagnosis not present

## 2016-01-16 DIAGNOSIS — D509 Iron deficiency anemia, unspecified: Secondary | ICD-10-CM | POA: Diagnosis not present

## 2016-01-19 DIAGNOSIS — D631 Anemia in chronic kidney disease: Secondary | ICD-10-CM | POA: Diagnosis not present

## 2016-01-19 DIAGNOSIS — N186 End stage renal disease: Secondary | ICD-10-CM | POA: Diagnosis not present

## 2016-01-19 DIAGNOSIS — D509 Iron deficiency anemia, unspecified: Secondary | ICD-10-CM | POA: Diagnosis not present

## 2016-01-19 DIAGNOSIS — N2581 Secondary hyperparathyroidism of renal origin: Secondary | ICD-10-CM | POA: Diagnosis not present

## 2016-01-19 DIAGNOSIS — E875 Hyperkalemia: Secondary | ICD-10-CM | POA: Diagnosis not present

## 2016-01-21 DIAGNOSIS — N2581 Secondary hyperparathyroidism of renal origin: Secondary | ICD-10-CM | POA: Diagnosis not present

## 2016-01-21 DIAGNOSIS — E875 Hyperkalemia: Secondary | ICD-10-CM | POA: Diagnosis not present

## 2016-01-21 DIAGNOSIS — D631 Anemia in chronic kidney disease: Secondary | ICD-10-CM | POA: Diagnosis not present

## 2016-01-21 DIAGNOSIS — D509 Iron deficiency anemia, unspecified: Secondary | ICD-10-CM | POA: Diagnosis not present

## 2016-01-21 DIAGNOSIS — N186 End stage renal disease: Secondary | ICD-10-CM | POA: Diagnosis not present

## 2016-01-23 DIAGNOSIS — N186 End stage renal disease: Secondary | ICD-10-CM | POA: Diagnosis not present

## 2016-01-23 DIAGNOSIS — N2581 Secondary hyperparathyroidism of renal origin: Secondary | ICD-10-CM | POA: Diagnosis not present

## 2016-01-23 DIAGNOSIS — D509 Iron deficiency anemia, unspecified: Secondary | ICD-10-CM | POA: Diagnosis not present

## 2016-01-23 DIAGNOSIS — E875 Hyperkalemia: Secondary | ICD-10-CM | POA: Diagnosis not present

## 2016-01-23 DIAGNOSIS — D631 Anemia in chronic kidney disease: Secondary | ICD-10-CM | POA: Diagnosis not present

## 2016-01-26 DIAGNOSIS — D509 Iron deficiency anemia, unspecified: Secondary | ICD-10-CM | POA: Diagnosis not present

## 2016-01-26 DIAGNOSIS — D631 Anemia in chronic kidney disease: Secondary | ICD-10-CM | POA: Diagnosis not present

## 2016-01-26 DIAGNOSIS — E875 Hyperkalemia: Secondary | ICD-10-CM | POA: Diagnosis not present

## 2016-01-26 DIAGNOSIS — N2581 Secondary hyperparathyroidism of renal origin: Secondary | ICD-10-CM | POA: Diagnosis not present

## 2016-01-26 DIAGNOSIS — N186 End stage renal disease: Secondary | ICD-10-CM | POA: Diagnosis not present

## 2016-01-28 DIAGNOSIS — D509 Iron deficiency anemia, unspecified: Secondary | ICD-10-CM | POA: Diagnosis not present

## 2016-01-28 DIAGNOSIS — I4892 Unspecified atrial flutter: Secondary | ICD-10-CM | POA: Diagnosis not present

## 2016-01-28 DIAGNOSIS — E875 Hyperkalemia: Secondary | ICD-10-CM | POA: Diagnosis not present

## 2016-01-28 DIAGNOSIS — N186 End stage renal disease: Secondary | ICD-10-CM | POA: Diagnosis not present

## 2016-01-28 DIAGNOSIS — N2581 Secondary hyperparathyroidism of renal origin: Secondary | ICD-10-CM | POA: Diagnosis not present

## 2016-01-28 DIAGNOSIS — D631 Anemia in chronic kidney disease: Secondary | ICD-10-CM | POA: Diagnosis not present

## 2016-01-28 NOTE — Progress Notes (Addendum)
Cardiology Office Note:    Date:  01/29/2016   ID:  [name], DOB [birth year], MRN [id number]  PCP:  Teressa Lower, MD  Cardiologist:  Dr. Lauree Chandler   Electrophysiologist:  n/a  Referring MD: Algis Greenhouse, MD   No chief complaint on file.   History of Present Illness:     [name] is a 60 y.o. 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  Patient was followed at [facility] after he underwent CABG plus bioprosthetic AVR at that institution. He suffered a non-STEMI in 5/16 and underwent PCI of the LCx with resolute DES and SVG-LAD with BMS (at [facility]). He also has a history of PAF during dialysis. He was admitted in 9/16 with SVT during dialysis and he was noted to be in atrial flutter upon arrival to the ED. He ruled in for non-STEMI. Myoview was high risk. LHC demonstrated patent bypass graft and mid LCx in-stent restenosis 65% that was not hemodynamically significant by FFR. He was treated medically. Last echo 9/16 with mild aortic stenosis and moderate mitral stenosis and normal LV function. Last seen by Dr. Angelena Form 11/16.   I received notes today that the patient is being evaluated for repeat renal transplant at [facility]. He had a nuclear stress test at [facility] in 3/17. This was low risk with normal perfusion. Echocardiogram demonstrated normal LV function with stable AVR. Transplant coordinator has asked if the patient can come off of Plavix.  Returns for FU.  Here with his wife. He is doing well. Denies chest discomfort. He does get short of breath with more extreme activities. Denies orthopnea, PND. He has chronic left  ankle edema without significant change. Denies syncope. Denies bleeding issues.   Past Medical History  Diagnosis Date  . 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 04/2013: s/p CABG x 2 (Y SVG -LAD & D2). b. 01/2015: NSTEMI s/p DES to LCx and BMS to SVG-LAD. c. NSTEMI 05/2015 in setting of atrial arrhythmias - cath without indication for PCI.  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 Peripheral vascular disease ([place])     Cath in 01/2015 required 45 cm Destination [place]  . Sleep apnea     a. intolerant of bipap.  Marland Kitchen PAF (paroxysmal atrial fibrillation) ([place])   . Multiple sclerosis ([place])   . Renal transplant failure and rejection   . 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 b. He had a renal Tx from 2008 to 2011, but subsequent rejection - Gets HD in [place], [place] on MWF schedule.    Marland Kitchen Hypertension   . Multiple sclerosis ([place])   . SVT (supraventricular tachycardia) ([place])   . Paroxysmal atrial flutter ([place])     a. During 05/2015 admission - SVT, atrial flutter, and PAF.  Marland Kitchen Anemia   . Valvular heart disease     a. 2D echo 05/2015: EF  55-60%, no RWMA, mild AS/mild AI, mod MS, severely dilated LA.  . Carotid artery disease ([place])     a. 1-39% stenosis bilaterally in 06/2015.     Past Surgical History  Procedure Laterality Date  . Av fistula placement    . Flash      flash pulmonary edema  . Kidney transplant  08/2007  . Hernia repair  07/2011  . Excised squamous cells at rectum  2006  . Left heart catheterization with coronary angiogram N/A 01/09/2013    Procedure: LEFT HEART CATHETERIZATION WITH CORONARY ANGIOGRAM;  Surgeon: Burnell Blanks, MD;  Location: [facility] CATH LAB;  Service: Cardiovascular;  Laterality: N/A;  . Cardiac catheterization N/A 05/26/2015    Procedure: Left Heart Cath and Coronary Angiography;  Surgeon: Leonie Man, MD;  Location: [place] CV LAB;  Service: Cardiovascular;  Laterality: N/A;  . Cardiac catheterization N/A 05/26/2015    Procedure: Intravascular Pressure Wire/FFR Study;  Surgeon: Leonie Man, MD;  Location: [place] CV LAB;  Service: Cardiovascular;  Laterality: N/A;    Current Medications: Outpatient Prescriptions Prior to Visit  Medication Sig Dispense Refill  . acetaminophen (TYLENOL) 500 MG tablet Take 1,000 mg by mouth every 6 (six) hours as needed for pain or fever.    Marland Kitchen albuterol (PROAIR HFA) 108 (90 BASE) MCG/ACT inhaler Inhale 1-2 puffs into the lungs every 6 (six) hours as needed for shortness of breath.     Marland Kitchen atorvastatin (LIPITOR) 40 MG tablet Take 1 tablet (40 mg total) by mouth at bedtime. 90 tablet 3  . calcium acetate (PHOSLO) 667 MG capsule Take 4,002 mg by mouth 3 (three) times daily with meals.     . citalopram (CELEXA) 20 MG tablet Take 20 mg by mouth daily.    . clopidogrel (PLAVIX) 75 MG tablet Take 1 tablet (75 mg total) by mouth daily with breakfast. 90 tablet 3  . docusate sodium (COLACE) 100 MG capsule Take 400 mg by mouth 2 (two) times daily.    . folic acid (FOLVITE) 1 MG tablet Take 1 mg by mouth daily.    Marland Kitchen gabapentin (NEURONTIN) 300 MG capsule Take 300 mg by mouth at bedtime.     . metoprolol tartrate (LOPRESSOR) 25 MG tablet Take 25 mg by mouth 2 (two) times daily.     . multivitamin (RENA-VIT) TABS tablet Take 1 tablet by mouth daily. 30 tablet 1  . nitroGLYCERIN (NITROSTAT) 0.4 MG SL tablet Place 1 tablet (0.4 mg total) under the tongue every 5 (five) minutes as needed for chest pain (Take one tabe every 5-10 minutes for chest pain. Once you have taken three tabs you need to notify your MD or go to ER if chest pain not gone.). 30 tablet 0  . ranitidine (ZANTAC) 150 MG tablet Take 150 mg by mouth daily.    . sodium polystyrene (KAYEXALATE) 15 GM/60ML suspension Take 15 g by mouth See admin instructions. Only take 15 g on Sun / Tues / Thurs /  Sat (non dialysis days)    . warfarin (COUMADIN) 7.5 MG tablet Take 1/2-1 tablet by mouth daily as directed by coumadin clinic 30 tablet 0  . azithromycin (ZITHROMAX) 250 MG tablet Reported on 01/29/2016  0   No facility-administered medications prior to visit.      Allergies:   Diphenhydramine; Penicillins; Adhesive; Amoxicillin; Other; and Penicillin g   Social History   Social History  . Marital Status: Married    Spouse Name: N/A  .  Number of Children: N/A  . Years of Education: N/A   Occupational History  . Disabled    Social History Main Topics  . Smoking status: Former Smoker -- 2.00 packs/day for 20 years    Types: Cigarettes    Quit date: 08/06/1998  . Smokeless tobacco: None  . Alcohol Use: No  . Drug Use: No  . Sexual Activity: Not Asked   Other Topics Concern  . None   Social History Narrative   No history of premature CAD in either parents or siblings. However, his maternal grandfather and 2 maternal uncles had coronary artery disease.     Family History:  The patient's family history includes Diabetes in his father; Heart attack in his maternal aunt, maternal grandfather, and maternal uncle; Heart failure in his mother; Hypertension in his maternal aunt; Lupus in his sister. There is no history of Stroke.   ROS:   Please see the history of present illness.    Review of Systems  Neurological: Positive for loss of balance.   All other systems reviewed and are negative.   Physical Exam:    VS:  BP 170/72 mmHg  Pulse 58  Ht '5\' 10"'$  (1.778 m)  Wt 188 lb 6.4 oz (85.458 kg)  BMI 27.03 kg/m2   GEN: Well nourished, well developed, in no acute distress HEENT: normal Neck: no JVD, no masses Cardiac: Normal Z6/X0, RRR; 2/6 systolic  Murmur RUSB, no rubs, or gallops, trace L ankle edema;     Respiratory:  Decreased breath sounds bilaterally; no wheezing, rhonchi or rales GI: soft, nontender, nondistended MS: no deformity or atrophy Skin: warm and dry Neuro:  No focal deficits  Psych: Alert and oriented x 3, normal affect  Wt Readings from Last 3 Encounters:  01/29/16 188 lb 6.4 oz (85.458 kg)  08/06/15 185 lb (83.915 kg)  06/05/15 186 lb (84.369 kg)      Studies/Labs Reviewed:     EKG:  EKG is  ordered today.  The ekg ordered today demonstrates Sinus bradycardia, HR 57, nonspecific ST-T wave changes, QTc 451 ms, no change since prior tracing  Recent Labs: 02/21/2015: B Natriuretic Peptide 2505.1* 05/27/2015: BUN 37*; Creatinine, Ser 7.24*; Hemoglobin 11.6*; Platelets 139*; Potassium 5.4*; Sodium 135   Recent Lipid Panel No results found for: CHOL, TRIG, HDL, CHOLHDL, VLDL, LDLCALC, LDLDIRECT  Labs (4/17): Trig 134, HDL 33, LDL 61, TC 121, ALT 16  Additional studies/ records that were reviewed today include:   Echo 12/04/15 (at [facility]) Mild LVH, EF >55%, AVR okay (mean gradient 9 mmHg). MAC, mild MR, mild MS (mean 6 mmHg), mild LAE, normal RVSF  Myoview 12/04/15 (at [facility]) Probable normal perfusion, no significant ischemia, apical scar versus artifact, EF 65%  Carotid US 10/16 Bilateral ICA 1-39%  Echo 9/16 Moderate concentric LVH, EF 55-60%, normal wall motion, mild aortic stenosis (mean gradient 17 mmHg), mild AI, severe MAC, moderate mitral stenosis (mean gradient 7 mmHg), mild to moderate MR, severe LAE  LHC 9/16 LM mild disease LAD ostial 40%, mid 100%-CTO, ostial D2 99% LCx mid stent 65% ISR FFR 0.87 (not hemodynamically significant)>>  RCA proximal 50% Y graft SVG-D2/distal LAD with ostial 40%, mid graft 50%, limb to LAD with stent patent with 10% ISR  Myoview 9/16 Large inferior wall infarct from apex to base. Apical ischemia EF 22%   ASSESSMENT:     1. Coronary artery disease involving native coronary artery of native heart without angina pectoris   2. PAF (paroxysmal atrial fibrillation) (  [place])   3. S/P aortic valve replacement   4. Mitral valve stenosis, unspecified etiology   5. Essential hypertension   6.  ESRD on hemodialysis ([place])   7. Bilateral carotid artery disease ([place])     PLAN:     In order of problems listed above:  1. CAD - s/p CABG in 2014 and PCI with DES to LCx and BMS to S-LAD in 5/16. Last cath in 9/16 with stable anatomy.  He remains on Plavix plus Coumadin. He is currently on the active list for renal transplant at [facility]. Transplant team would like him to come off Plavix. It has been 1 year since his PCI. I will review this further with Dr. Angelena Form regarding whether or not we should change him from Plavix to aspirin or continue him on Coumadin alone. Continue statin, beta blocker.  -  Addendum 01/30/2016 2:04 PM:  Reviewed with Dr. Angelena Form.  Ok to DC Plavix but he will need to start ASA 81 mg QD and remain on ASA 81 mg QD + Coumadin given prior hx of multivessel PCI.   2. PAF - Maintaining normal sinus rhythm. He has remained on Coumadin for anticoagulation. CHADS2-VASc=2 (HTN, vascular dz).     3. S/p AVR - Stable findings by echocardiogram at [facility] in 3/17. Continue SBE prophylaxis.    4. Mitral stenosis - Mild by echo in 3/17.    5. HTN - Blood pressure elevated today. I have asked him to continue to monitor this and call me with readings after couple of weeks. Consider adding amlodipine if blood pressure remains above target.  6. ESRD -   Remains on hemodialysis. Currently being considered for renal transplant at [facility].  7. Carotid artery disease -   Mild plaque by carotid US 10/16. Continue statin.   Medication Adjustments/Labs and Tests Ordered: Current medicines are reviewed at length with the patient today.  Concerns regarding medicines are outlined above.  Medication changes, Labs and Tests ordered today are outlined in the Patient Instructions noted below. Patient Instructions  Medication Instructions:  Your physician recommends that you continue on your current medications as directed. Please refer to the Current Medication list given to you  today. Labwork: NONE Testing/Procedures: NONE Follow-Up: Your physician wants you to follow-up in: [place] will receive a reminder letter in the mail two months in advance. If you don't receive a letter, please call our office to schedule the follow-up appointment. Any Other Special Instructions Will Be Listed Below (If Applicable). MONITOR BP DAILY AND CALL WITH YOUR BP READINGS AFTER 2 WEEKS; GOAL IS TO HAVE BP <140/90 If you need a refill on your cardiac medications before your next appointment, please call your pharmacy.    Signed, Richardson Dopp, PA-C  01/29/2016 [facility] [place], [place], [place]  [address] Phone: [phone]; Fax: [phone]

## 2016-01-29 ENCOUNTER — Ambulatory Visit (INDEPENDENT_AMBULATORY_CARE_PROVIDER_SITE_OTHER): Payer: Medicare Other | Admitting: Physician Assistant

## 2016-01-29 ENCOUNTER — Encounter: Payer: Self-pay | Admitting: Physician Assistant

## 2016-01-29 ENCOUNTER — Ambulatory Visit (INDEPENDENT_AMBULATORY_CARE_PROVIDER_SITE_OTHER): Payer: Medicare Other | Admitting: Pharmacist

## 2016-01-29 VITALS — BP 170/72 | HR 58 | Ht 70.0 in | Wt 188.4 lb

## 2016-01-29 DIAGNOSIS — I214 Non-ST elevation (NSTEMI) myocardial infarction: Secondary | ICD-10-CM

## 2016-01-29 DIAGNOSIS — Z992 Dependence on renal dialysis: Secondary | ICD-10-CM

## 2016-01-29 DIAGNOSIS — I4892 Unspecified atrial flutter: Secondary | ICD-10-CM | POA: Diagnosis not present

## 2016-01-29 DIAGNOSIS — I10 Essential (primary) hypertension: Secondary | ICD-10-CM

## 2016-01-29 DIAGNOSIS — I2510 Atherosclerotic heart disease of native coronary artery without angina pectoris: Secondary | ICD-10-CM

## 2016-01-29 DIAGNOSIS — N186 End stage renal disease: Secondary | ICD-10-CM

## 2016-01-29 DIAGNOSIS — Z5181 Encounter for therapeutic drug level monitoring: Secondary | ICD-10-CM | POA: Diagnosis not present

## 2016-01-29 DIAGNOSIS — Z954 Presence of other heart-valve replacement: Secondary | ICD-10-CM

## 2016-01-29 DIAGNOSIS — I480 Paroxysmal atrial fibrillation: Secondary | ICD-10-CM

## 2016-01-29 DIAGNOSIS — I4891 Unspecified atrial fibrillation: Secondary | ICD-10-CM | POA: Diagnosis not present

## 2016-01-29 DIAGNOSIS — Z952 Presence of prosthetic heart valve: Secondary | ICD-10-CM

## 2016-01-29 DIAGNOSIS — I739 Peripheral vascular disease, unspecified: Secondary | ICD-10-CM

## 2016-01-29 DIAGNOSIS — I050 Rheumatic mitral stenosis: Secondary | ICD-10-CM | POA: Diagnosis not present

## 2016-01-29 DIAGNOSIS — I779 Disorder of arteries and arterioles, unspecified: Secondary | ICD-10-CM

## 2016-01-29 LAB — POCT INR: INR: 3

## 2016-01-29 NOTE — Patient Instructions (Addendum)
Medication Instructions:  Your physician recommends that you continue on your current medications as directed. Please refer to the Current Medication list given to you today. Labwork: NONE Testing/Procedures: NONE Follow-Up: Your physician wants you to follow-up in: [name] will receive a reminder letter in the mail two months in advance. If you don't receive a letter, please call our office to schedule the follow-up appointment. Any Other Special Instructions Will Be Listed Below (If Applicable). MONITOR BP DAILY AND CALL WITH YOUR BP READINGS AFTER 2 WEEKS; GOAL IS TO HAVE BP <140/90 If you need a refill on your cardiac medications before your next appointment, please call your pharmacy.

## 2016-01-30 ENCOUNTER — Telehealth: Payer: Self-pay | Admitting: Physician Assistant

## 2016-01-30 DIAGNOSIS — D509 Iron deficiency anemia, unspecified: Secondary | ICD-10-CM | POA: Diagnosis not present

## 2016-01-30 DIAGNOSIS — E875 Hyperkalemia: Secondary | ICD-10-CM | POA: Diagnosis not present

## 2016-01-30 DIAGNOSIS — D631 Anemia in chronic kidney disease: Secondary | ICD-10-CM | POA: Diagnosis not present

## 2016-01-30 DIAGNOSIS — N186 End stage renal disease: Secondary | ICD-10-CM | POA: Diagnosis not present

## 2016-01-30 DIAGNOSIS — N2581 Secondary hyperparathyroidism of renal origin: Secondary | ICD-10-CM | POA: Diagnosis not present

## 2016-01-30 NOTE — Telephone Encounter (Signed)
Please tell patient I reviewed with Dr. Lauree Chandler  It is ok to DC Plavix. He needs to start ASA 81 mg QD Continue Coumadin.  Please fax my OV note to: Maryann Alar, RN, MSN Pre-Kidney Transplant Coordinator [facility] [address] [place], [place] [address] Phone # [phone] Fax # [phone]  Richardson Dopp, PA-C   01/30/2016 2:10 PM

## 2016-02-02 DIAGNOSIS — E875 Hyperkalemia: Secondary | ICD-10-CM | POA: Diagnosis not present

## 2016-02-02 DIAGNOSIS — D509 Iron deficiency anemia, unspecified: Secondary | ICD-10-CM | POA: Diagnosis not present

## 2016-02-02 DIAGNOSIS — D631 Anemia in chronic kidney disease: Secondary | ICD-10-CM | POA: Diagnosis not present

## 2016-02-02 DIAGNOSIS — N2581 Secondary hyperparathyroidism of renal origin: Secondary | ICD-10-CM | POA: Diagnosis not present

## 2016-02-02 DIAGNOSIS — N186 End stage renal disease: Secondary | ICD-10-CM | POA: Diagnosis not present

## 2016-02-03 MED ORDER — ASPIRIN EC 81 MG PO TBEC: 81.0000 mg | DELAYED_RELEASE_TABLET | Freq: Every day | ORAL | Status: DC

## 2016-02-03 NOTE — Telephone Encounter (Signed)
Pt has been notified per Dr. Angelena Form and Brynda Rim. PA to stop Plavix, start ASA 81 mg daily and continue Coumadin. Pt verbalized understanding. I will also fax to [place], [place] said thank you.

## 2016-02-03 NOTE — Addendum Note (Signed)
Addended by: Michae Kava on: 02/03/2016 09:43 AM   Modules accepted: Orders

## 2016-02-04 DIAGNOSIS — N186 End stage renal disease: Secondary | ICD-10-CM | POA: Diagnosis not present

## 2016-02-04 DIAGNOSIS — N2581 Secondary hyperparathyroidism of renal origin: Secondary | ICD-10-CM | POA: Diagnosis not present

## 2016-02-04 DIAGNOSIS — D631 Anemia in chronic kidney disease: Secondary | ICD-10-CM | POA: Diagnosis not present

## 2016-02-04 DIAGNOSIS — T8612 Kidney transplant failure: Secondary | ICD-10-CM | POA: Diagnosis not present

## 2016-02-04 DIAGNOSIS — E875 Hyperkalemia: Secondary | ICD-10-CM | POA: Diagnosis not present

## 2016-02-04 DIAGNOSIS — Z992 Dependence on renal dialysis: Secondary | ICD-10-CM | POA: Diagnosis not present

## 2016-02-04 DIAGNOSIS — D509 Iron deficiency anemia, unspecified: Secondary | ICD-10-CM | POA: Diagnosis not present

## 2016-02-06 DIAGNOSIS — D631 Anemia in chronic kidney disease: Secondary | ICD-10-CM | POA: Diagnosis not present

## 2016-02-06 DIAGNOSIS — N186 End stage renal disease: Secondary | ICD-10-CM | POA: Diagnosis not present

## 2016-02-06 DIAGNOSIS — N2581 Secondary hyperparathyroidism of renal origin: Secondary | ICD-10-CM | POA: Diagnosis not present

## 2016-02-06 DIAGNOSIS — D509 Iron deficiency anemia, unspecified: Secondary | ICD-10-CM | POA: Diagnosis not present

## 2016-02-06 DIAGNOSIS — E875 Hyperkalemia: Secondary | ICD-10-CM | POA: Diagnosis not present

## 2016-02-13 ENCOUNTER — Ambulatory Visit (INDEPENDENT_AMBULATORY_CARE_PROVIDER_SITE_OTHER): Payer: Medicare Other | Admitting: Cardiology

## 2016-02-13 ENCOUNTER — Telehealth: Payer: Self-pay | Admitting: Cardiovascular Disease

## 2016-02-13 ENCOUNTER — Encounter: Payer: Self-pay | Admitting: Cardiology

## 2016-02-13 VITALS — BP 164/86 | HR 59 | Ht 70.0 in | Wt 185.6 lb

## 2016-02-13 DIAGNOSIS — I2510 Atherosclerotic heart disease of native coronary artery without angina pectoris: Secondary | ICD-10-CM

## 2016-02-13 DIAGNOSIS — I480 Paroxysmal atrial fibrillation: Secondary | ICD-10-CM

## 2016-02-13 DIAGNOSIS — I10 Essential (primary) hypertension: Secondary | ICD-10-CM

## 2016-02-13 DIAGNOSIS — Z952 Presence of prosthetic heart valve: Secondary | ICD-10-CM

## 2016-02-13 DIAGNOSIS — Z954 Presence of other heart-valve replacement: Secondary | ICD-10-CM | POA: Diagnosis not present

## 2016-02-13 DIAGNOSIS — I4891 Unspecified atrial fibrillation: Secondary | ICD-10-CM

## 2016-02-13 DIAGNOSIS — N186 End stage renal disease: Secondary | ICD-10-CM

## 2016-02-13 DIAGNOSIS — Z992 Dependence on renal dialysis: Secondary | ICD-10-CM

## 2016-02-13 NOTE — Telephone Encounter (Signed)
Spoke with pt wife, the pt felt fine this morning and took his metoprolol. At dialysis he felt anxious and felt like his heart was racing and took another metoprolol. His bp dropped and they gave him oxygen. He currently feels anxious and her bp is 119/79 and his pulse is 104. The folks at dialysis told him he needed to be seen today. Will discuss with laura ingold np

## 2016-02-13 NOTE — Progress Notes (Signed)
Cardiology Office Note   Date:  02/13/2016   ID:  [name], DOB 06/28/1956, MRN [id number]  PCP:  Teressa Lower, MD  Cardiologist:  Dr. Maisie Fus     Chief Complaint  Patient presents with  . Tachycardia      History of Present Illness: [name] is a 60 y.o. male who presents for tachycardia and with and after dialysis. He called and asked to be seen.  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 Patient was followed at [facility] after he underwent CABG plus bioprosthetic AVR at that institution. He suffered a non-STEMI in 5/16 and underwent PCI of the LCx with resolute DES and SVG-LAD with BMS (at [facility]). He also has a history of PAF during dialysis. He was admitted in 9/16 with SVT during dialysis and he was noted to be in atrial flutter upon arrival to the ED. He ruled in for non-STEMI. Myoview was high risk. LHC demonstrated patent bypass graft and mid LCx in-stent restenosis 65% that was not hemodynamically significant by FFR. He was treated medically. Last echo 9/16 with mild aortic stenosis and moderate mitral stenosis and normal LV function. Last seen by Dr. Angelena Form 11/16.   The patient has been evaluated for repeat renal transplant at [facility]. He had a nuclear stress test at [facility] in 3/17. This was low risk with normal perfusion. Echocardiogram demonstrated normal LV function with stable AVR. Transplant coordinator had asked if the patient could stop Plavix.  Per Dr. Angelena Form "Ok to DC Plavix but he will need to start ASA 81 mg QD and remain on ASA 81 mg QD + Coumadin given prior hx of multivessel PCI. " Pt is high on the  list and could go at any time.  Today presents after episode of what he believes to be a fib with dialysis.  HR to 120, he took extra metoprolol and his HR slowed back to 70.  He had no chest pain just felt tense all over his body.   He also had another episode last week and took half of lopressor and no changes then repeated and converted to SR.  He is on anticoagulation. No chest pain with any episode but felt tense all over.   He and his wife were concerned that it was related to CAD as he had PAF prior to stent and they stopped the plavix.     Past Medical History  Diagnosis Date  . 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 04/2013: s/p CABG x 2 (Y SVG -LAD & D2). b. 01/2015: NSTEMI s/p DES to LCx and BMS to SVG-LAD. c. NSTEMI 05/2015 in setting of atrial arrhythmias - cath without indication for PCI.  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 Peripheral vascular disease ([place])     Cath in 01/2015 required 45 cm [place]  . Sleep apnea     a. intolerant of bipap.  Marland Kitchen PAF (paroxysmal atrial  fibrillation) ([place])   . Multiple sclerosis ([place])   . Renal transplant failure and rejection   . 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 b. He had a renal Tx from 2008 to 2011, but subsequent rejection - Gets HD in [place], [place] on MWF schedule.    Marland Kitchen Hypertension   . Multiple sclerosis ([place])   . SVT (supraventricular tachycardia) ([place])   . Paroxysmal atrial flutter ([place])     a. During 05/2015 admission - SVT, atrial flutter, and PAF.  Marland Kitchen Anemia   . Valvular heart disease     a. 2D echo 05/2015: EF 55-60%, no RWMA, mild AS/mild AI, mod MS, severely dilated LA.  . Carotid artery disease ([place])     a. 1-39% stenosis bilaterally in 06/2015.     Past Surgical History  Procedure Laterality Date  . Av fistula placement    . Flash      flash  pulmonary edema  . Kidney transplant  08/2007  . Hernia repair  07/2011  . Excised squamous cells at rectum  2006  . Left heart catheterization with coronary angiogram N/A 01/09/2013    Procedure: LEFT HEART CATHETERIZATION WITH CORONARY ANGIOGRAM;  Surgeon: Burnell Blanks, MD;  Location: [facility] CATH LAB;  Service: Cardiovascular;  Laterality: N/A;  . Cardiac catheterization N/A 05/26/2015    Procedure: Left Heart Cath and Coronary Angiography;  Surgeon: Leonie Man, MD;  Location: [place] CV LAB;  Service: Cardiovascular;  Laterality: N/A;  . Cardiac catheterization N/A 05/26/2015    Procedure: Intravascular Pressure Wire/FFR Study;  Surgeon: Leonie Man, MD;  Location: [place] CV LAB;  Service: Cardiovascular;  Laterality: N/A;     Current Outpatient Prescriptions  Medication Sig Dispense Refill  . acetaminophen (TYLENOL) 500 MG tablet Take 1,000 mg by mouth every 6 (six) hours as needed for pain or fever.    Marland Kitchen albuterol (PROAIR HFA) 108 (90 BASE) MCG/ACT inhaler Inhale 1-2 puffs into the lungs every 6 (six) hours as needed for shortness of breath.     Marland Kitchen aspirin EC 81 MG tablet Take 1 tablet (81 mg total) by mouth daily.    Marland Kitchen atorvastatin (LIPITOR) 40 MG tablet Take 1 tablet (40 mg total) by mouth at bedtime. 90 tablet 3  . calcium acetate (PHOSLO) 667 MG capsule Take 4,002 mg by mouth 3 (three) times daily with meals.     . citalopram (CELEXA) 20 MG tablet Take 20 mg by mouth daily.    . clopidogrel (PLAVIX) 75 MG tablet Take 1 tablet (75 mg total) by mouth daily with breakfast. 90 tablet 3  . docusate sodium (COLACE) 100 MG capsule Take 400 mg by mouth 2 (two) times daily.    . folic acid (FOLVITE) 1 MG tablet Take 1 mg by mouth daily.    Marland Kitchen gabapentin (NEURONTIN) 300 MG capsule Take 300 mg by mouth at bedtime.     . metoprolol tartrate (LOPRESSOR) 25 MG tablet Take 25 mg by mouth 2 (two) times daily.     . multivitamin (RENA-VIT) TABS tablet Take 1 tablet by mouth daily.  30 tablet 1  . nitroGLYCERIN (NITROSTAT) 0.4 MG SL tablet Place 0.4 mg under the tongue every 5 (five) minutes as needed for chest pain (Take one tabe every 5-10 minutes for chest pain. Once you have taken three tabs you need to notify your MD or go to ER if chest pain not gone.).    Marland Kitchen  ranitidine (ZANTAC) 150 MG tablet Take 150 mg by mouth daily.    . sodium polystyrene (KAYEXALATE) 15 GM/60ML suspension Take 15 g by mouth See admin instructions. Only take 15 g on Sun / Tues / Thurs / Sat (non dialysis days)    . warfarin (COUMADIN) 7.5 MG tablet Take 1/2-1 tablet by mouth daily as directed by coumadin clinic 30 tablet 0   No current facility-administered medications for this visit.    Allergies:   Diphenhydramine; Penicillins; Adhesive; Amoxicillin; Other; and Penicillin g    Social History:  The patient  reports that he quit smoking about 17 years ago. His smoking use included Cigarettes. He has a 40 pack-year smoking history. He does not have any smokeless tobacco history on file. He reports that he does not drink alcohol or use illicit drugs.   Family History:  The patient's family history includes Diabetes in his father; Heart attack in his maternal aunt, maternal grandfather, and maternal uncle; Heart failure in his mother; Hypertension in his maternal aunt; Lupus in his sister. There is no history of Stroke.    ROS:  General:no colds or fevers, no weight changes Skin:no rashes or ulcers HEENT:no blurred vision, no congestion CV:see HPI PUL:see HPI GI:no diarrhea constipation or melena, no indigestion GU:no hematuria, no dysuria MS:no joint pain, no claudication Neuro:no syncope, no lightheadedness Endo:no diabetes, no thyroid disease  Wt Readings from Last 3 Encounters:  02/13/16 185 lb 9.6 oz (84.188 kg)  01/29/16 188 lb 6.4 oz (85.458 kg)  08/06/15 185 lb (83.915 kg)     PHYSICAL EXAM: VS:  BP 164/86 mmHg  Pulse 59  Ht '5\' 10"'$  (1.778 m)  Wt 185 lb 9.6 oz (84.188 kg)   BMI 26.63 kg/m2 , BMI Body mass index is 26.63 kg/(m^2). General:Pleasant affect, NAD Skin:Warm and dry, brisk capillary refill HEENT:normocephalic, sclera clear, mucus membranes moist Neck:supple, no JVD, + vibration of graft  Heart:S1S2 RRR with soft systolic murmur, gallup, rub or click Lungs:clear without rales, + rhonchi, no wheezes QQV:ZDGL, non tender, + BS, do not palpate liver spleen or masses Ext:no to tr lower ext edema, 2+ radial pulses, Lt forearm graft with strong thrill Neuro:alert and oriented X 3, MAE, follows commands, + facial symmetry    EKG:  EKG is ordered today. The ekg ordered today demonstrates SB at 50 no acute ST changes, + PACs. - stable.    Recent Labs: 02/21/2015: B Natriuretic Peptide 2505.1* 05/27/2015: BUN 37*; Creatinine, Ser 7.24*; Hemoglobin 11.6*; Platelets 139*; Potassium 5.4*; Sodium 135    Lipid Panel No results found for: CHOL, TRIG, HDL, CHOLHDL, VLDL, LDLCALC, LDLDIRECT     Other studies Reviewed: Additional studies/ records that were reviewed today include: . Echo 12/04/15 (at [facility]) Mild LVH, EF >55%, AVR okay (mean gradient 9 mmHg). MAC, mild MR, mild MS (mean 6 mmHg), mild LAE, normal RVSF  Myoview 12/04/15 (at [facility]) Probable normal perfusion, no significant ischemia, apical scar versus artifact, EF 65%  Carotid US 10/16 Bilateral ICA 1-39%  Echo 9/16 Moderate concentric LVH, EF 55-60%, normal wall motion, mild aortic stenosis (mean gradient 17 mmHg), mild AI, severe MAC, moderate mitral stenosis (mean gradient 7 mmHg), mild to moderate MR, severe LAE  LHC 9/16 LM mild disease LAD ostial 40%, mid 100%-CTO, ostial D2 99% LCx mid stent 65% ISR FFR 0.87 (not hemodynamically significant)>>  RCA proximal 50% Y graft SVG-D2/distal LAD with ostial 40%, mid graft 50%, limb to LAD with stent patent with 10% ISR  Myoview  9/16 Large inferior wall infarct from apex to base. Apical ischemia EF 22%  ASSESSMENT AND PLAN:  1.  PAF  - Maintaining normal sinus rhythm most of the time. He has remained on Coumadin for anticoagulation. CHADS2-VASc=2 (HTN, vascular dz). 2 episode of PAF - if continue to occur place event monitor to evaluate burden - his HR could not tolerate higher dose of routine metoprolol     2.  CAD - s/p CABG in 2014 and PCI with DES to LCx and BMS to S-LAD in 5/16. Last cath in 9/16 with stable anatomy. Now off Plavix -  He is currently on the active list for renal transplant at [facility]. Transplant team would like him to come off Plavix. It has been 1 year since his PCI. I will review this further with Dr. Angelena Form regarding whether or not we should change him from Plavix to aspirin or continue him on Coumadin alone. Continue statin, beta blocker. -  Dr. Angelena Form. Ok to DC Plavix but he will need to start ASA 81 mg QD and remain on ASA 81 mg QD + Coumadin given prior hx of multivessel PCI.       Do not think PAF related to CAD recent normal nuc. No chest pain.    Quick follow up in 4 weeks to make sure stable.  3. S/p AVR - Stable findings by echocardiogram at [facility] in 3/17. Continue SBE prophylaxis.   4. Mitral stenosis - Mild by echo in 3/17.   5. HTN - Blood pressure elevated today. I have asked him to continue to monitor this and call me with readings after couple of weeks. Consider adding amlodipine if blood pressure remains above target.  6. ESRD - Remains on hemodialysis. Currently on top of list for renal transplant at [facility].  7. Carotid artery disease - Mild plaque by carotid US 10/16. Continue statin.  Current medicines are reviewed with the patient today.  The patient Has no concerns regarding medicines.  The following changes have been made:  See above Labs/ tests ordered today include:see above  Disposition:   FU:  see above  Signed, Cecilie Kicks, NP  02/13/2016 4:31 PM    [facility] [place], [place], [place] Phone: [phone]; Fax: [phone]

## 2016-02-13 NOTE — Patient Instructions (Signed)
Medication Instructions:  Your physician recommends that you continue on your current medications as directed. Please refer to the Current Medication list given to you today.   Labwork: None ordered  Testing/Procedures: None ordered  Follow-Up: Your physician recommends that you schedule a follow-up appointment in: [place] [facility]  Any Other Special Instructions Will Be Listed Below (If Applicable).     If you need a refill on your cardiac medications before your next appointment, please call your pharmacy.

## 2016-02-13 NOTE — Telephone Encounter (Signed)
Spoke with pt wife, after discussing with laura ingold np, pt scheduled to be seen today.

## 2016-02-13 NOTE — Telephone Encounter (Signed)
Patient c/o Palpitations:  High priority if patient c/o lightheadedness and shortness of breath.  1. How long have you been having palpitations? Today- at dialysis  2. Are you currently experiencing lightheadedness and shortness of breath?not as of now- was lightheaded this am @ dialysis   3. Have you checked your BP and heart rate? (document readings)- pt stated- checked BP twice at dialysis was 98/"something low" and waited checked again and it went back up to normal. HR was fluctuating from 60-123  4. Are you experiencing any other symptoms? Feeling anxious   Pt wife stated that RN @ dialysis wanted pt to be seen today. Please call back and discuss.

## 2016-02-15 ENCOUNTER — Inpatient Hospital Stay (HOSPITAL_COMMUNITY): Payer: Medicare Other

## 2016-02-15 ENCOUNTER — Inpatient Hospital Stay (HOSPITAL_COMMUNITY)
Admission: EM | Admit: 2016-02-15 | Discharge: 2016-02-19 | DRG: 246 | Disposition: A | Discharge status: Home / Self Care | Payer: Medicare Other | Attending: Cardiovascular Disease | Admitting: Cardiovascular Disease

## 2016-02-15 ENCOUNTER — Encounter (HOSPITAL_COMMUNITY)
Admission: EM | Disposition: A | Discharge status: Home / Self Care | Payer: Self-pay | Source: Home / Self Care | Attending: Cardiovascular Disease

## 2016-02-15 ENCOUNTER — Emergency Department (HOSPITAL_COMMUNITY): Payer: Medicare Other

## 2016-02-15 DIAGNOSIS — Y838 Other surgical procedures as the cause of abnormal reaction of the patient, or of later complication, without mention of misadventure at the time of the procedure: Secondary | ICD-10-CM | POA: Diagnosis not present

## 2016-02-15 DIAGNOSIS — R0902 Hypoxemia: Secondary | ICD-10-CM | POA: Diagnosis present

## 2016-02-15 DIAGNOSIS — R079 Chest pain, unspecified: Secondary | ICD-10-CM

## 2016-02-15 DIAGNOSIS — Z87891 Personal history of nicotine dependence: Secondary | ICD-10-CM | POA: Diagnosis not present

## 2016-02-15 DIAGNOSIS — I252 Old myocardial infarction: Secondary | ICD-10-CM

## 2016-02-15 DIAGNOSIS — I25810 Atherosclerosis of coronary artery bypass graft(s) without angina pectoris: Secondary | ICD-10-CM | POA: Diagnosis present

## 2016-02-15 DIAGNOSIS — I97630 Postprocedural hematoma of a circulatory system organ or structure following a cardiac catheterization: Secondary | ICD-10-CM | POA: Diagnosis not present

## 2016-02-15 DIAGNOSIS — I2102 ST elevation (STEMI) myocardial infarction involving left anterior descending coronary artery: Secondary | ICD-10-CM

## 2016-02-15 DIAGNOSIS — Z79899 Other long term (current) drug therapy: Secondary | ICD-10-CM | POA: Diagnosis not present

## 2016-02-15 DIAGNOSIS — Z9981 Dependence on supplemental oxygen: Secondary | ICD-10-CM

## 2016-02-15 DIAGNOSIS — I452 Bifascicular block: Secondary | ICD-10-CM | POA: Diagnosis present

## 2016-02-15 DIAGNOSIS — Z91048 Other nonmedicinal substance allergy status: Secondary | ICD-10-CM

## 2016-02-15 DIAGNOSIS — Z8674 Personal history of sudden cardiac arrest: Secondary | ICD-10-CM

## 2016-02-15 DIAGNOSIS — E211 Secondary hyperparathyroidism, not elsewhere classified: Secondary | ICD-10-CM | POA: Diagnosis not present

## 2016-02-15 DIAGNOSIS — Z952 Presence of prosthetic heart valve: Secondary | ICD-10-CM

## 2016-02-15 DIAGNOSIS — I213 ST elevation (STEMI) myocardial infarction of unspecified site: Secondary | ICD-10-CM | POA: Diagnosis not present

## 2016-02-15 DIAGNOSIS — Z8249 Family history of ischemic heart disease and other diseases of the circulatory system: Secondary | ICD-10-CM | POA: Diagnosis not present

## 2016-02-15 DIAGNOSIS — Z880 Allergy status to penicillin: Secondary | ICD-10-CM

## 2016-02-15 DIAGNOSIS — Z888 Allergy status to other drugs, medicaments and biological substances status: Secondary | ICD-10-CM | POA: Diagnosis not present

## 2016-02-15 DIAGNOSIS — I2129 ST elevation (STEMI) myocardial infarction involving other sites: Secondary | ICD-10-CM | POA: Diagnosis not present

## 2016-02-15 DIAGNOSIS — I2510 Atherosclerotic heart disease of native coronary artery without angina pectoris: Secondary | ICD-10-CM

## 2016-02-15 DIAGNOSIS — I480 Paroxysmal atrial fibrillation: Secondary | ICD-10-CM | POA: Diagnosis present

## 2016-02-15 DIAGNOSIS — I959 Hypotension, unspecified: Secondary | ICD-10-CM | POA: Diagnosis not present

## 2016-02-15 DIAGNOSIS — N2581 Secondary hyperparathyroidism of renal origin: Secondary | ICD-10-CM | POA: Diagnosis present

## 2016-02-15 DIAGNOSIS — I2109 ST elevation (STEMI) myocardial infarction involving other coronary artery of anterior wall: Secondary | ICD-10-CM | POA: Diagnosis present

## 2016-02-15 DIAGNOSIS — D62 Acute posthemorrhagic anemia: Secondary | ICD-10-CM

## 2016-02-15 DIAGNOSIS — Z992 Dependence on renal dialysis: Secondary | ICD-10-CM

## 2016-02-15 DIAGNOSIS — Z940 Kidney transplant status: Secondary | ICD-10-CM

## 2016-02-15 DIAGNOSIS — N186 End stage renal disease: Secondary | ICD-10-CM | POA: Diagnosis present

## 2016-02-15 DIAGNOSIS — Z7901 Long term (current) use of anticoagulants: Secondary | ICD-10-CM | POA: Diagnosis not present

## 2016-02-15 DIAGNOSIS — S301XXA Contusion of abdominal wall, initial encounter: Secondary | ICD-10-CM

## 2016-02-15 DIAGNOSIS — Z955 Presence of coronary angioplasty implant and graft: Secondary | ICD-10-CM

## 2016-02-15 DIAGNOSIS — T82858A Stenosis of vascular prosthetic devices, implants and grafts, initial encounter: Principal | ICD-10-CM | POA: Diagnosis present

## 2016-02-15 DIAGNOSIS — I120 Hypertensive chronic kidney disease with stage 5 chronic kidney disease or end stage renal disease: Secondary | ICD-10-CM | POA: Diagnosis present

## 2016-02-15 DIAGNOSIS — R791 Abnormal coagulation profile: Secondary | ICD-10-CM | POA: Diagnosis present

## 2016-02-15 DIAGNOSIS — Z85118 Personal history of other malignant neoplasm of bronchus and lung: Secondary | ICD-10-CM

## 2016-02-15 DIAGNOSIS — I25709 Atherosclerosis of coronary artery bypass graft(s), unspecified, with unspecified angina pectoris: Secondary | ICD-10-CM | POA: Diagnosis not present

## 2016-02-15 DIAGNOSIS — T8611 Kidney transplant rejection: Secondary | ICD-10-CM | POA: Diagnosis present

## 2016-02-15 DIAGNOSIS — G35 Multiple sclerosis: Secondary | ICD-10-CM | POA: Diagnosis present

## 2016-02-15 DIAGNOSIS — I4729 Other ventricular tachycardia: Secondary | ICD-10-CM

## 2016-02-15 DIAGNOSIS — I739 Peripheral vascular disease, unspecified: Secondary | ICD-10-CM | POA: Diagnosis present

## 2016-02-15 DIAGNOSIS — T8612 Kidney transplant failure: Secondary | ICD-10-CM | POA: Diagnosis present

## 2016-02-15 DIAGNOSIS — G4733 Obstructive sleep apnea (adult) (pediatric): Secondary | ICD-10-CM | POA: Diagnosis present

## 2016-02-15 DIAGNOSIS — Z881 Allergy status to other antibiotic agents status: Secondary | ICD-10-CM | POA: Diagnosis not present

## 2016-02-15 DIAGNOSIS — I4892 Unspecified atrial flutter: Secondary | ICD-10-CM | POA: Diagnosis present

## 2016-02-15 DIAGNOSIS — I10 Essential (primary) hypertension: Secondary | ICD-10-CM | POA: Diagnosis present

## 2016-02-15 DIAGNOSIS — D649 Anemia, unspecified: Secondary | ICD-10-CM

## 2016-02-15 DIAGNOSIS — I25700 Atherosclerosis of coronary artery bypass graft(s), unspecified, with unstable angina pectoris: Secondary | ICD-10-CM | POA: Diagnosis not present

## 2016-02-15 DIAGNOSIS — Z9861 Coronary angioplasty status: Secondary | ICD-10-CM

## 2016-02-15 DIAGNOSIS — D631 Anemia in chronic kidney disease: Secondary | ICD-10-CM | POA: Diagnosis not present

## 2016-02-15 DIAGNOSIS — I472 Ventricular tachycardia: Secondary | ICD-10-CM | POA: Diagnosis not present

## 2016-02-15 DIAGNOSIS — Z7982 Long term (current) use of aspirin: Secondary | ICD-10-CM

## 2016-02-15 DIAGNOSIS — Z951 Presence of aortocoronary bypass graft: Secondary | ICD-10-CM

## 2016-02-15 HISTORY — DX: Bifascicular block: I45.2

## 2016-02-15 HISTORY — DX: Other injury of unspecified body region, initial encounter: T14.8XXA

## 2016-02-15 HISTORY — PX: CARDIAC CATHETERIZATION: SHX172

## 2016-02-15 HISTORY — DX: Idiopathic sleep related nonobstructive alveolar hypoventilation: G47.34

## 2016-02-15 HISTORY — DX: Acute posthemorrhagic anemia: D62

## 2016-02-15 HISTORY — DX: Dependence on supplemental oxygen: Z99.81

## 2016-02-15 LAB — COMPREHENSIVE METABOLIC PANEL
ALT: 17 U/L (ref 17–63)
AST: 20 U/L (ref 15–41)
Albumin: 3.5 g/dL (ref 3.5–5.0)
Alkaline Phosphatase: 68 U/L (ref 38–126)
Anion gap: 17 — ABNORMAL HIGH (ref 5–15)
BILIRUBIN TOTAL: 0.7 mg/dL (ref 0.3–1.2)
BUN: 52 mg/dL — ABNORMAL HIGH (ref 6–20)
CHLORIDE: 99 mmol/L — AB (ref 101–111)
CO2: 24 mmol/L (ref 22–32)
CREATININE: 8.66 mg/dL — AB (ref 0.61–1.24)
Calcium: 9.5 mg/dL (ref 8.9–10.3)
GFR, EST AFRICAN AMERICAN: 7 mL/min — AB (ref >60)
GFR, EST NON AFRICAN AMERICAN: 6 mL/min — AB (ref >60)
Glucose, Bld: 111 mg/dL — ABNORMAL HIGH (ref 65–99)
POTASSIUM: 4.4 mmol/L (ref 3.5–5.1)
Sodium: 140 mmol/L (ref 135–145)
TOTAL PROTEIN: 7.6 g/dL (ref 6.5–8.1)

## 2016-02-15 LAB — ECHOCARDIOGRAM COMPLETE
AO mean calculated velocity dopler: 170 cm/s
AOVTI: 47.5 cm
AV Peak grad: 24 mmHg
AV VEL mean LVOT/AV: 0.42
AV area mean vel ind: 0.65 cm2/m2
AV peak Index: 0.71
AVAREAMEANV: 1.33 cm2
AVAREAVTI: 1.45 cm2
AVAREAVTIIND: 0.74 cm2/m2
AVG: 13 mmHg
AVLVOTPG: 5 mmHg
AVPHT: 596 ms
AVPKVEL: 243 cm/s
Ao pk vel: 0.46 m/s
CHL CUP AV VEL: 1.51
CHL CUP MV DEC (S): 335
EWDT: 335 ms
FS: 35 % (ref 28–44)
Height: 70 in
IVS/LV PW RATIO, ED: 1
LA ID, A-P, ES: 44 mm
LA diam index: 2.16 cm/m2
LA vol A4C: 90.4 ml
LA vol index: 47 mL/m2
LA vol: 95.9 mL
LDCA: 3.14 cm2
LEFT ATRIUM END SYS DIAM: 44 mm
LVOT VTI: 22.9 cm
LVOT diameter: 20 mm
LVOT peak VTI: 0.48 cm
LVOT peak vel: 112 cm/s
LVOTSV: 72 mL
MV VTI: 174 cm
MV pk A vel: 161 m/s
MV pk E vel: 163 m/s
MVPG: 11 mmHg
PW: 11.2 mm — AB (ref 0.6–1.1)
TAPSE: 24.7 mm
Valve area index: 0.74
Valve area: 1.51 cm2
WEIGHTICAEL: 3040.58 [oz_av]

## 2016-02-15 LAB — DIFFERENTIAL
BASOS ABS: 0 10*3/uL (ref 0.0–0.1)
BASOS PCT: 0 %
EOS ABS: 0.2 10*3/uL (ref 0.0–0.7)
Eosinophils Relative: 2 %
Lymphocytes Relative: 9 %
Lymphs Abs: 0.8 10*3/uL (ref 0.7–4.0)
Monocytes Absolute: 0.7 10*3/uL (ref 0.1–1.0)
Monocytes Relative: 8 %
NEUTROS PCT: 81 %
Neutro Abs: 7.2 10*3/uL (ref 1.7–7.7)

## 2016-02-15 LAB — POCT ACTIVATED CLOTTING TIME
ACTIVATED CLOTTING TIME: 224 s
ACTIVATED CLOTTING TIME: 202 s
ACTIVATED CLOTTING TIME: 202 s
Activated Clotting Time: 340 seconds
Activated Clotting Time: 301 seconds
Activated Clotting Time: 577 seconds
Activated Clotting Time: 219 seconds

## 2016-02-15 LAB — TROPONIN I
TROPONIN I: 0.15 ng/mL — AB (ref <0.031)
Troponin I: 14.2 ng/mL (ref <0.031)
Troponin I: >65 ng/mL (ref <0.031)
Troponin I: >65 ng/mL (ref <0.031)

## 2016-02-15 LAB — LIPID PANEL
CHOL/HDL RATIO: 3.9 ratio
CHOLESTEROL: 105 mg/dL (ref 0–200)
HDL: 27 mg/dL — ABNORMAL LOW (ref >40)
LDL Cholesterol: 47 mg/dL (ref 0–99)
Triglycerides: 156 mg/dL — ABNORMAL HIGH (ref <150)
VLDL: 31 mg/dL (ref 0–40)

## 2016-02-15 LAB — PROTIME-INR
INR: 3.14 — AB (ref 0.00–1.49)
Prothrombin Time: 31.7 seconds — ABNORMAL HIGH (ref 11.6–15.2)

## 2016-02-15 LAB — PHOSPHORUS: Phosphorus: 7.9 mg/dL — ABNORMAL HIGH (ref 2.5–4.6)

## 2016-02-15 LAB — CBC
HCT: 37.3 % — ABNORMAL LOW (ref 39.0–52.0)
Hemoglobin: 11.7 g/dL — ABNORMAL LOW (ref 13.0–17.0)
MCH: 31.3 pg (ref 26.0–34.0)
MCHC: 31.4 g/dL (ref 30.0–36.0)
MCV: 99.7 fL (ref 78.0–100.0)
Platelets: 165 10*3/uL (ref 150–400)
RBC: 3.74 MIL/uL — ABNORMAL LOW (ref 4.22–5.81)
RDW: 14.5 % (ref 11.5–15.5)
WBC: 8.9 10*3/uL (ref 4.0–10.5)

## 2016-02-15 LAB — APTT: APTT: 59 s — AB (ref 24–37)

## 2016-02-15 LAB — MRSA PCR SCREENING: MRSA BY PCR: NEGATIVE

## 2016-02-15 SURGERY — LEFT HEART CATH AND CORONARY ANGIOGRAPHY
Anesthesia: LOCAL

## 2016-02-15 MED ORDER — GABAPENTIN 300 MG PO CAPS
300.0000 mg | ORAL_CAPSULE | Freq: Every day | ORAL | Status: DC
  Administered 2016-02-15 – 2016-02-18 (×4): 300 mg via ORAL
  Filled 2016-02-15 (×4): qty 1

## 2016-02-15 MED ORDER — ONDANSETRON HCL 4 MG/2ML IJ SOLN
4.0000 mg | Freq: Four times a day (QID) | INTRAMUSCULAR | Status: DC | PRN
  Administered 2016-02-16: 4 mg via INTRAVENOUS
  Filled 2016-02-15: qty 2

## 2016-02-15 MED ORDER — DOCUSATE SODIUM 100 MG PO CAPS
200.0000 mg | ORAL_CAPSULE | Freq: Two times a day (BID) | ORAL | Status: DC
  Administered 2016-02-15 – 2016-02-18 (×5): 200 mg via ORAL
  Filled 2016-02-15 (×8): qty 2

## 2016-02-15 MED ORDER — ALBUTEROL SULFATE (2.5 MG/3ML) 0.083% IN NEBU: 2.5000 mg | INHALATION_SOLUTION | Freq: Four times a day (QID) | RESPIRATORY_TRACT | Status: DC | PRN

## 2016-02-15 MED ORDER — LIDOCAINE HCL (PF) 1 % IJ SOLN
INTRAMUSCULAR | Status: AC
  Filled 2016-02-15: qty 30

## 2016-02-15 MED ORDER — ASPIRIN 81 MG PO CHEW
324.0000 mg | CHEWABLE_TABLET | Freq: Once | ORAL | Status: AC
  Administered 2016-02-15: 324 mg via ORAL
  Filled 2016-02-15: qty 4

## 2016-02-15 MED ORDER — SODIUM CHLORIDE 0.9% FLUSH: 3.0000 mL | INTRAVENOUS | Status: DC | PRN

## 2016-02-15 MED ORDER — IOPAMIDOL (ISOVUE-370) INJECTION 76%
INTRAVENOUS | Status: AC
  Filled 2016-02-15: qty 125

## 2016-02-15 MED ORDER — METOPROLOL TARTRATE 25 MG PO TABS
25.0000 mg | ORAL_TABLET | Freq: Two times a day (BID) | ORAL | Status: DC
  Administered 2016-02-15 – 2016-02-19 (×7): 25 mg via ORAL
  Filled 2016-02-15 (×7): qty 1

## 2016-02-15 MED ORDER — SODIUM CHLORIDE 0.9 % IV SOLN: INTRAVENOUS | Status: AC

## 2016-02-15 MED ORDER — HEPARIN (PORCINE) IN NACL 2-0.9 UNIT/ML-% IJ SOLN
INTRAMUSCULAR | Status: AC
  Filled 2016-02-15: qty 1000

## 2016-02-15 MED ORDER — HEPARIN SODIUM (PORCINE) 1000 UNIT/ML IJ SOLN
INTRAMUSCULAR | Status: DC | PRN
  Administered 2016-02-15: 8000 [IU] via INTRAVENOUS

## 2016-02-15 MED ORDER — CALCIUM ACETATE (PHOS BINDER) 667 MG PO CAPS
4002.0000 mg | ORAL_CAPSULE | Freq: Three times a day (TID) | ORAL | Status: DC
  Administered 2016-02-15 – 2016-02-19 (×10): 4002 mg via ORAL
  Filled 2016-02-15 (×10): qty 6

## 2016-02-15 MED ORDER — ALBUTEROL SULFATE HFA 108 (90 BASE) MCG/ACT IN AERS: 1.0000 | INHALATION_SPRAY | Freq: Four times a day (QID) | RESPIRATORY_TRACT | Status: DC | PRN

## 2016-02-15 MED ORDER — SODIUM CHLORIDE 0.9 % IV SOLN: 250.0000 mL | INTRAVENOUS | Status: DC | PRN

## 2016-02-15 MED ORDER — NITROGLYCERIN 1 MG/10 ML FOR IR/CATH LAB
INTRA_ARTERIAL | Status: AC
Start: 2016-02-15 — End: 2016-02-15
  Filled 2016-02-15: qty 10

## 2016-02-15 MED ORDER — HEPARIN SODIUM (PORCINE) 5000 UNIT/ML IJ SOLN
4000.0000 [IU] | INTRAMUSCULAR | Status: AC
  Administered 2016-02-15: 4000 [IU] via INTRAVENOUS
  Filled 2016-02-15: qty 1

## 2016-02-15 MED ORDER — SODIUM POLYSTYRENE SULFONATE 15 GM/60ML PO SUSP
15.0000 g | ORAL | Status: DC
  Filled 2016-02-15: qty 60

## 2016-02-15 MED ORDER — VERAPAMIL HCL 2.5 MG/ML IV SOLN
INTRAVENOUS | Status: AC
  Filled 2016-02-15: qty 2

## 2016-02-15 MED ORDER — NITROGLYCERIN 0.4 MG SL SUBL: 0.4000 mg | SUBLINGUAL_TABLET | SUBLINGUAL | Status: DC | PRN

## 2016-02-15 MED ORDER — NITROGLYCERIN 1 MG/10 ML FOR IR/CATH LAB
INTRA_ARTERIAL | Status: DC | PRN
  Administered 2016-02-15 (×2): 200 ug via INTRACORONARY

## 2016-02-15 MED ORDER — ZOLPIDEM TARTRATE 5 MG PO TABS: 5.0000 mg | ORAL_TABLET | Freq: Every evening | ORAL | Status: DC | PRN

## 2016-02-15 MED ORDER — SODIUM CHLORIDE 0.9 % IV SOLN
10.0000 mL/h | INTRAVENOUS | Status: DC
  Administered 2016-02-15: 10 mL/h via INTRAVENOUS

## 2016-02-15 MED ORDER — SODIUM CHLORIDE 0.9% FLUSH
3.0000 mL | Freq: Two times a day (BID) | INTRAVENOUS | Status: DC
  Administered 2016-02-16 – 2016-02-18 (×4): 3 mL via INTRAVENOUS

## 2016-02-15 MED ORDER — ACETAMINOPHEN 325 MG PO TABS: 650.0000 mg | ORAL_TABLET | ORAL | Status: DC | PRN

## 2016-02-15 MED ORDER — ATORVASTATIN CALCIUM 40 MG PO TABS
40.0000 mg | ORAL_TABLET | Freq: Every day | ORAL | Status: DC
  Administered 2016-02-15 – 2016-02-18 (×4): 40 mg via ORAL
  Filled 2016-02-15 (×4): qty 1

## 2016-02-15 MED ORDER — CLOPIDOGREL BISULFATE 75 MG PO TABS
75.0000 mg | ORAL_TABLET | Freq: Every day | ORAL | Status: DC
  Administered 2016-02-16 – 2016-02-19 (×4): 75 mg via ORAL
  Filled 2016-02-15 (×3): qty 1

## 2016-02-15 MED ORDER — IOPAMIDOL (ISOVUE-370) INJECTION 76%
INTRAVENOUS | Status: AC
  Filled 2016-02-15: qty 100

## 2016-02-15 MED ORDER — LIDOCAINE HCL (PF) 1 % IJ SOLN
INTRAMUSCULAR | Status: DC | PRN
  Administered 2016-02-15: 08:00:00

## 2016-02-15 MED ORDER — CLOPIDOGREL BISULFATE 300 MG PO TABS
ORAL_TABLET | ORAL | Status: AC
  Filled 2016-02-15: qty 1

## 2016-02-15 MED ORDER — ASPIRIN EC 81 MG PO TBEC
81.0000 mg | DELAYED_RELEASE_TABLET | Freq: Every day | ORAL | Status: DC
  Administered 2016-02-16 – 2016-02-19 (×4): 81 mg via ORAL
  Filled 2016-02-15 (×4): qty 1

## 2016-02-15 MED ORDER — CLOPIDOGREL BISULFATE 300 MG PO TABS
ORAL_TABLET | ORAL | Status: DC | PRN
  Administered 2016-02-15: 600 mg via ORAL

## 2016-02-15 MED ORDER — SODIUM CHLORIDE 0.9% FLUSH: 3.0000 mL | Freq: Two times a day (BID) | INTRAVENOUS | Status: DC

## 2016-02-15 MED ORDER — HEPARIN SODIUM (PORCINE) 1000 UNIT/ML IJ SOLN
INTRAMUSCULAR | Status: AC
  Filled 2016-02-15: qty 1

## 2016-02-15 MED ORDER — SODIUM CHLORIDE 0.9% FLUSH
3.0000 mL | Freq: Two times a day (BID) | INTRAVENOUS | Status: DC
  Administered 2016-02-17 (×2): 3 mL via INTRAVENOUS

## 2016-02-15 MED ORDER — ONDANSETRON HCL 4 MG/2ML IJ SOLN: 4.0000 mg | Freq: Four times a day (QID) | INTRAMUSCULAR | Status: DC | PRN

## 2016-02-15 MED ORDER — RENA-VITE PO TABS
1.0000 | ORAL_TABLET | Freq: Every day | ORAL | Status: DC
Start: 2016-02-15 — End: 2016-02-19
  Administered 2016-02-15 – 2016-02-19 (×5): 1 via ORAL
  Filled 2016-02-15 (×5): qty 1

## 2016-02-15 MED ORDER — CITALOPRAM HYDROBROMIDE 20 MG PO TABS
20.0000 mg | ORAL_TABLET | Freq: Every day | ORAL | Status: DC
  Administered 2016-02-15 – 2016-02-19 (×5): 20 mg via ORAL
  Filled 2016-02-15 (×5): qty 1

## 2016-02-15 MED ORDER — FENTANYL CITRATE (PF) 100 MCG/2ML IJ SOLN
INTRAMUSCULAR | Status: AC
  Filled 2016-02-15: qty 2

## 2016-02-15 MED ORDER — MORPHINE SULFATE (PF) 4 MG/ML IV SOLN
4.0000 mg | INTRAVENOUS | Status: DC | PRN
Start: 2016-02-15 — End: 2016-02-19
  Administered 2016-02-15 – 2016-02-16 (×2): 4 mg via INTRAVENOUS
  Filled 2016-02-15 (×2): qty 1

## 2016-02-15 MED ORDER — IOPAMIDOL (ISOVUE-370) INJECTION 76%
INTRAVENOUS | Status: DC | PRN
  Administered 2016-02-15: 255 mL via INTRA_ARTERIAL

## 2016-02-15 MED ORDER — CALCIUM ACETATE 667 MG PO CAPS: 4002.0000 mg | ORAL_CAPSULE | Freq: Three times a day (TID) | ORAL | Status: DC

## 2016-02-15 MED ORDER — FOLIC ACID 1 MG PO TABS
1.0000 mg | ORAL_TABLET | Freq: Every day | ORAL | Status: DC
  Administered 2016-02-16 – 2016-02-19 (×4): 1 mg via ORAL
  Filled 2016-02-15 (×4): qty 1

## 2016-02-15 MED ORDER — ATROPINE SULFATE 1 MG/10ML IJ SOSY
PREFILLED_SYRINGE | INTRAMUSCULAR | Status: DC
Start: 2016-02-15 — End: 2016-02-16
  Filled 2016-02-15: qty 10

## 2016-02-15 MED ORDER — FAMOTIDINE 20 MG PO TABS
20.0000 mg | ORAL_TABLET | Freq: Every day | ORAL | Status: DC
  Administered 2016-02-15 – 2016-02-19 (×5): 20 mg via ORAL
  Filled 2016-02-15 (×5): qty 1

## 2016-02-15 MED ORDER — FENTANYL CITRATE (PF) 100 MCG/2ML IJ SOLN
INTRAMUSCULAR | Status: DC | PRN
  Administered 2016-02-15 (×2): 50 ug via INTRAVENOUS

## 2016-02-15 MED ORDER — ACETAMINOPHEN 325 MG PO TABS
650.0000 mg | ORAL_TABLET | ORAL | Status: DC | PRN
  Filled 2016-02-15: qty 2

## 2016-02-15 MED ORDER — ALPRAZOLAM 0.25 MG PO TABS: 0.2500 mg | ORAL_TABLET | Freq: Two times a day (BID) | ORAL | Status: DC | PRN

## 2016-02-15 SURGICAL SUPPLY — 28 items
BALLN EMERGE MR 3.5X12 (BALLOONS) ×2
BALLN EUPHORA RX 2.75X15 (BALLOONS) ×2
BALLN ~~LOC~~ EUPHORA RX 2.5X20 (BALLOONS) ×2
BALLN ~~LOC~~ EUPHORA RX 3.0X15 (BALLOONS) ×2
BALLOON EMERGE MR 3.5X12 (BALLOONS) IMPLANT
BALLOON EUPHORA RX 2.75X15 (BALLOONS) IMPLANT
BALLOON ~~LOC~~ EUPHORA RX 2.5X20 (BALLOONS) IMPLANT
BALLOON ~~LOC~~ EUPHORA RX 3.0X15 (BALLOONS) IMPLANT
CATH EXTRAC PRONTO 5.5F 138CM (CATHETERS) ×1 IMPLANT
CATH INFINITI 5FR AL1 (CATHETERS) ×1 IMPLANT
CATH INFINITI 5FR JL5 (CATHETERS) ×1 IMPLANT
CATH INFINITI 5FR MULTPACK ANG (CATHETERS) ×1 IMPLANT
CATH VISTA GUIDE 6FR LCB (CATHETERS) ×1 IMPLANT
KIT ENCORE 26 ADVANTAGE (KITS) ×1 IMPLANT
KIT HEART LEFT (KITS) ×2 IMPLANT
PACK CARDIAC CATHETERIZATION (CUSTOM PROCEDURE TRAY) ×2 IMPLANT
SET INTRODUCER MICROPUNCT 5F (INTRODUCER) ×1 IMPLANT
SHEATH PINNACLE 6F 10CM (SHEATH) ×1 IMPLANT
SHEATH PINNACLE 7F 10CM (SHEATH) ×1 IMPLANT
SHEATH PINNACLE MP 6F 45CM (SHEATH) ×1 IMPLANT
STENT PROMUS PREM MR 2.5X20 (Permanent Stent) ×1 IMPLANT
SYR MEDRAD MARK V 150ML (SYRINGE) ×2 IMPLANT
TRANSDUCER W/STOPCOCK (MISCELLANEOUS) ×2 IMPLANT
TUBING CIL FLEX 10 FLL-RA (TUBING) ×2 IMPLANT
WIRE EMERALD 3MM-J .035X150CM (WIRE) ×1 IMPLANT
WIRE HI TORQ VERSACORE-J 145CM (WIRE) ×1 IMPLANT
WIRE INTUITION HYDRO ST. 180CM (WIRE) ×1 IMPLANT
WIRE RUNTHROUGH .014X180CM (WIRE) ×1 IMPLANT

## 2016-02-15 NOTE — ED Notes (Signed)
Placed pt. On oxygen 2l [place] sats were 89-90%

## 2016-02-15 NOTE — Progress Notes (Signed)
Utilization review completed.  

## 2016-02-15 NOTE — ED Notes (Signed)
Pt c/o center chest pain onset 0330 today. Pain is in center and radiates into neck and teeth.

## 2016-02-15 NOTE — Progress Notes (Signed)
Dr.  Garlon Hatchet

## 2016-02-15 NOTE — ED Notes (Signed)
Cardiology paged to Dr. Tomi Bamberger to [phone] @ [phone].

## 2016-02-15 NOTE — ED Notes (Signed)
Page on code stemi went out @ 0707.

## 2016-02-15 NOTE — H&P (Signed)
Patient ID: [name] MRN: [id number], DOB/AGE: 1956/06/03   Admit date: 02/15/2016   Primary Physician: Teressa Lower, MD Primary Cardiologist: Dr Fransisco Hertz  HPI: 60 y.o. 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 He is s/p 2V CABG in 2014 (Y SVG to LAD & D2) with a tissue AVR after the patient suffered a cardiac arrest s/p a VATS procedure (for lung CA). He then had a NSTEMI 01/2015- s/p DES to LCx and BMS to SVG-LAD at [facility]. He was admitted again in 9/16 with atrial flutter during dialysis. He ruled in for non-STEMI. Myoview was high risk. LHC demonstrated patent bypass graft and mid LCx ISR of 65% that was not hemodynamically significant by FFR. He was treated medically.   The patient has been evaluated for repeat renal transplant at [facility]. He had a nuclear stress test at [facility] in 3/17. This was low risk with normal perfusion. Echocardiogram demonstrated normal LV function with stable AVR. Transplant coordinator had asked if the patient could stop Plavix. Per Dr. Angelena Form (see 02/13/16 OV) the pt was told he could stop Plavix but he was to take ASA 81 mg as well as Coumadin. Early this AM (4am) he developed mid sternal chest pain with radiation to his jaw and teeth. He presented with an anterior STEMI and was taken to the cath lab urgently by Dr Fletcher Anon.      Problem List: Past Medical History  Diagnosis Date  . 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 04/2013: s/p CABG x 2 (Y SVG -LAD & D2). b. 01/2015: NSTEMI s/p DES to LCx and BMS to SVG-LAD. c. NSTEMI 05/2015 in setting of atrial arrhythmias - cath without indication for PCI.  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 Peripheral vascular disease ([place])     Cath in 01/2015 required 45 cm  Destination [place]  . Sleep apnea     a. intolerant of bipap.  Marland Kitchen PAF (paroxysmal atrial fibrillation) ([place])   . Multiple sclerosis ([place])   . Renal transplant failure and rejection   . 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 b. He had a renal Tx from 2008 to 2011, but subsequent rejection - Gets HD in [place], [place] on MWF schedule.    Marland Kitchen Hypertension   . Multiple sclerosis ([place])   . SVT (supraventricular tachycardia) ([place])   . Paroxysmal atrial flutter ([place])     a. During 05/2015 admission - SVT, atrial flutter, and PAF.  Marland Kitchen Anemia   . Valvular heart disease     a. 2D echo 05/2015: EF 55-60%, no RWMA, mild AS/mild AI, mod MS, severely dilated LA.  . Carotid artery disease ([place])     a. 1-39% stenosis bilaterally in 06/2015.     Past Surgical History  Procedure Laterality Date  . Av fistula placement    . Flash      flash pulmonary edema  . Kidney transplant  08/2007  . Hernia repair  07/2011  . Excised squamous cells at rectum  2006  . Left heart catheterization with coronary angiogram N/A 01/09/2013    Procedure: LEFT HEART CATHETERIZATION WITH CORONARY ANGIOGRAM;  Surgeon: Burnell Blanks, MD;  Location: [facility] CATH LAB;  Service: Cardiovascular;  Laterality: N/A;  . Cardiac catheterization N/A 05/26/2015    Procedure: Left Heart Cath and Coronary Angiography;  Surgeon: Leonie Man, MD;  Location: [place] CV LAB;  Service: Cardiovascular;  Laterality: N/A;  . Cardiac catheterization N/A 05/26/2015    Procedure: Intravascular Pressure Wire/FFR Study;  Surgeon: Leonie Man, MD;  Location: [place] CV LAB;  Service: Cardiovascular;  Laterality: N/A;     Allergies:  Allergies  Allergen Reactions  . Diphenhydramine Itching    Only with IV doses. Tolerates oral.  . Penicillins Other (See Comments)    migraine  . Adhesive [Tape] Rash    Please use paper tape  . Amoxicillin Rash    migraine  . Other Rash    Please use paper  tape  . Penicillin G Rash    headache     Home Medications Prior to Admission medications   Medication Sig Start Date End Date Taking? Authorizing Provider  acetaminophen (TYLENOL) 500 MG tablet Take 1,000 mg by mouth every 6 (six) hours as needed for pain or fever.    Historical Provider, MD  albuterol (PROAIR HFA) 108 (90 BASE) MCG/ACT inhaler Inhale 1-2 puffs into the lungs every 6 (six) hours as needed for shortness of breath.  07/19/11   Historical Provider, MD  aspirin EC 81 MG tablet Take 1 tablet (81 mg total) by mouth daily. 02/03/16   Liliane Shi, PA-C  atorvastatin (LIPITOR) 40 MG tablet Take 1 tablet (40 mg total) by mouth at bedtime. 04/08/15   Brett Canales, PA-C  calcium acetate (PHOSLO) 667 MG capsule Take 4,002 mg by mouth 3 (three) times daily with meals.     Historical Provider, MD  citalopram (CELEXA) 20 MG tablet Take 20 mg by mouth daily.    Historical Provider, MD  clopidogrel (PLAVIX) 75 MG tablet Take 1 tablet (75 mg total) by mouth daily with breakfast. 04/08/15   Brett Canales, PA-C  docusate sodium (COLACE) 100 MG capsule Take 400 mg by mouth 2 (two) times daily.    Historical Provider, MD  folic acid (FOLVITE) 1 MG tablet Take 1 mg by mouth daily.    Historical Provider, MD  gabapentin (NEURONTIN) 300 MG capsule Take 300 mg by mouth at bedtime.     Historical Provider, MD  metoprolol tartrate (LOPRESSOR) 25 MG tablet Take 25 mg by mouth 2 (two) times daily.     Historical Provider, MD  multivitamin (RENA-VIT) TABS tablet Take 1 tablet by mouth daily. 01/11/13   Samella Parr, NP  nitroGLYCERIN (NITROSTAT) 0.4 MG SL tablet Place 0.4 mg under the tongue every 5 (five) minutes as needed for chest pain (Take one tabe every 5-10 minutes for chest pain. Once you have taken three tabs you need to notify your MD or go to ER if chest pain not gone.).    Historical Provider, MD  ranitidine (ZANTAC) 150 MG tablet Take 150 mg by mouth daily.    Historical Provider, MD  sodium  polystyrene (KAYEXALATE) 15 GM/60ML suspension Take 15 g by mouth See admin instructions. Only take 15 g on Sun / Tues / Thurs / Sat (non dialysis days)    Historical Provider, MD  warfarin (COUMADIN) 7.5 MG tablet Take 1/2-1 tablet by mouth daily as directed by coumadin clinic 01/06/16   Brittainy Erie Noe, PA-C     Family History  Problem Relation Age of Onset  .  Diabetes Father   . Heart failure Mother     started in 57s  . Lupus Sister   . Heart attack Maternal Grandfather   . Heart attack Maternal Uncle   . Heart attack Maternal Aunt   . Hypertension Maternal Aunt   . Stroke Neg Hx      Social History   Social History  . Marital Status: Married    Spouse Name: N/A  . Number of Children: N/A  . Years of Education: N/A   Occupational History  . Disabled    Social History Main Topics  . Smoking status: Former Smoker -- 2.00 packs/day for 20 years    Types: Cigarettes    Quit date: 08/06/1998  . Smokeless tobacco: Not on file  . Alcohol Use: No  . Drug Use: No  . Sexual Activity: Not on file   Other Topics Concern  . Not on file   Social History Narrative   No history of premature CAD in either parents or siblings. However, his maternal grandfather and 2 maternal uncles had coronary artery disease.     Review of Systems: General: negative for chills, fever, night sweats or weight changes.  Cardiovascular: negative for edema, orthopnea, paroxysmal nocturnal dyspnea  HEENT: negative for any visual disturbances, blindness, glaucoma Dermatological: negative for rash Respiratory: negative for cough, hemoptysis, or wheezing Urologic: negative for hematuria or dysuria Abdominal: negative for nausea, vomiting, diarrhea, bright red blood per rectum, melena, or hematemesis Neurologic: negative for visual changes, syncope, or dizziness Musculoskeletal: negative for back pain, joint pain, or swelling Psych: cooperative and appropriate All other systems reviewed and are  otherwise negative except as noted above.  Physical Exam: Blood pressure 140/83, pulse 80, temperature 97.9 F (36.6 C), temperature source Oral, resp. rate 14, height '5\' 10"'$  (1.778 m), weight 185 lb (83.915 kg), SpO2 88 %.  General appearance: alert, cooperative and no distress Neck: no carotid bruit and no JVD Lungs: few expiratory wheezes Heart: regular rate and rhythm and soft systolic murmur LSB Abdomen: non tender, surgical scar from previous renal transplant with surgical hernia noted Extremities: no edema, AVF LUE Pulses: 2+ and symmetric Skin: Skin color, texture, turgor normal. No rashes or lesions Neurologic: Grossly normal    Labs:   Results for orders placed or performed during the hospital encounter of 02/15/16 (from the past 24 hour(s))  CBC     Status: Abnormal   Collection Time: 02/15/16  7:08 AM  Result Value Ref Range   WBC 8.9 4.0 - 10.5 K/uL   RBC 3.74 (L) 4.22 - 5.81 MIL/uL   Hemoglobin 11.7 (L) 13.0 - 17.0 g/dL   HCT 37.3 (L) 39.0 - 52.0 %   MCV 99.7 78.0 - 100.0 fL   MCH 31.3 26.0 - 34.0 pg   MCHC 31.4 30.0 - 36.0 g/dL   RDW 14.5 11.5 - 15.5 %   Platelets 165 150 - 400 K/uL  Differential     Status: None   Collection Time: 02/15/16  7:08 AM  Result Value Ref Range   Neutrophils Relative % 81 %   Neutro Abs 7.2 1.7 - 7.7 K/uL   Lymphocytes Relative 9 %   Lymphs Abs 0.8 0.7 - 4.0 K/uL   Monocytes Relative 8 %   Monocytes Absolute 0.7 0.1 - 1.0 K/uL   Eosinophils Relative 2 %   Eosinophils Absolute 0.2 0.0 - 0.7 K/uL   Basophils Relative 0 %   Basophils Absolute 0.0 0.0 - 0.1 K/uL  Protime-INR  Status: Abnormal   Collection Time: 02/15/16  7:08 AM  Result Value Ref Range   Prothrombin Time 31.7 (H) 11.6 - 15.2 seconds   INR 3.14 (H) 0.00 - 1.49  APTT     Status: Abnormal   Collection Time: 02/15/16  7:08 AM  Result Value Ref Range   aPTT 59 (H) 24 - 37 seconds  Comprehensive metabolic panel     Status: Abnormal   Collection Time: 02/15/16   7:08 AM  Result Value Ref Range   Sodium 140 135 - 145 mmol/L   Potassium 4.4 3.5 - 5.1 mmol/L   Chloride 99 (L) 101 - 111 mmol/L   CO2 24 22 - 32 mmol/L   Glucose, Bld 111 (H) 65 - 99 mg/dL   BUN 52 (H) 6 - 20 mg/dL   Creatinine, Ser 8.66 (H) 0.61 - 1.24 mg/dL   Calcium 9.5 8.9 - 10.3 mg/dL   Total Protein 7.6 6.5 - 8.1 g/dL   Albumin 3.5 3.5 - 5.0 g/dL   AST 20 15 - 41 U/L   ALT 17 17 - 63 U/L   Alkaline Phosphatase 68 38 - 126 U/L   Total Bilirubin 0.7 0.3 - 1.2 mg/dL   GFR calc non Af Amer 6 (L) >60 mL/min   GFR calc Af Amer 7 (L) >60 mL/min   Anion gap 17 (H) 5 - 15  Troponin I     Status: Abnormal   Collection Time: 02/15/16  7:08 AM  Result Value Ref Range   Troponin I 0.15 (H) <0.031 ng/mL  Lipid panel     Status: Abnormal   Collection Time: 02/15/16  7:08 AM  Result Value Ref Range   Cholesterol 105 0 - 200 mg/dL   Triglycerides 156 (H) <150 mg/dL   HDL 27 (L) >40 mg/dL   Total CHOL/HDL Ratio 3.9 RATIO   VLDL 31 0 - 40 mg/dL   LDL Cholesterol 47 0 - 99 mg/dL     Radiology/Studies: No results found.  EKG: Post PCI- NSR, anterior ST elevation improved  ASSESSMENT AND PLAN:  Principal Problem:   ST elevation myocardial infarction (STEMI) ([place]) Active Problems:   CAD of artery bypass graft-occluded SVG-LAD 02/15/16   CAD- S/P PCI/DES to SVG-LAD 02/15/16   PAF (paroxysmal atrial fibrillation) ([facility])   ESRD on hemodialysis   Essential (primary) hypertension   Chronic anticoagulation-Coumadin   Hx of CABG x 2 2014   Renal transplant failure and rejection   Multiple sclerosis ([place])   OSA -not compliant with C-pap   History of tissue AVR 2014   PLAN: Pt seen post PCI-currently pain free and comfortable. Coumadin to be held for now, difficult cath/acsess with high risk for groin hematoma, continue ASA Plavix. I will notify renal service of pt's admission.    Angelena Form, PA-C 02/15/2016, 10:14 AM [phone]

## 2016-02-15 NOTE — Progress Notes (Signed)
Echocardiogram 2D Echocardiogram has been performed.  [name] 02/15/2016, 3:55 PM

## 2016-02-15 NOTE — ED Provider Notes (Signed)
CSN: [id number]     Arrival date & time 02/15/16  9211 History   First MD Initiated Contact with Patient 02/15/16 [phone]     Chief Complaint  Patient presents with  . Chest Pain   HPI Comments: Pt has history of CAD, a fib, valvular disease s/p CABG and stents.  Chest pain started around 4 am.  Pressure in his chest going to his teeth.  Hx of dialysis.   Goes MWF.  Last went on Friday.  Patient is a 60 y.o. male presenting with chest pain. The history is provided by the patient.  Chest Pain Pain location:  Substernal area Pain quality: pressure   Radiates to: jaw and teeth. Pain severity:  Moderate Onset quality:  Gradual Duration:  3 hours Timing:  Constant Chronicity:  New Relieved by:  Nothing Ineffective treatments:  Nitroglycerin Associated symptoms: no abdominal pain, no fatigue, no fever, no nausea, no shortness of breath and not vomiting   Risk factors: coronary artery disease     Past Medical History  Diagnosis Date  . 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 04/2013: s/p CABG x 2 (Y SVG -LAD & D2). b. 01/2015: NSTEMI s/p DES to LCx and BMS to SVG-LAD. c. NSTEMI 05/2015 in setting of atrial arrhythmias - cath without indication for PCI.  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 Peripheral vascular disease ([place])     Cath in 01/2015 required 45 cm Destination Sheath  . Sleep apnea     a. intolerant of bipap.  Marland Kitchen PAF (paroxysmal atrial fibrillation) ([place])   . Multiple sclerosis ([place])   . Renal transplant failure and rejection   . 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 b. He had a renal Tx from 2008 to 2011, but subsequent rejection - Gets HD in [place], [place] on MWF schedule.    Marland Kitchen Hypertension   . Multiple sclerosis ([place])   . SVT (supraventricular tachycardia) ([place])   . Paroxysmal atrial flutter ([place])     a.  During 05/2015 admission - SVT, atrial flutter, and PAF.  Marland Kitchen Anemia   . Valvular heart disease     a. 2D echo 05/2015: EF 55-60%, no RWMA, mild AS/mild AI, mod MS, severely dilated LA.  . Carotid artery disease ([place])     a. 1-39% stenosis bilaterally in 06/2015.    Past Surgical History  Procedure Laterality Date  . Av fistula placement    . Flash      flash pulmonary edema  . Kidney transplant  08/2007  . Hernia repair  07/2011  . Excised squamous cells at rectum  2006  . Left heart catheterization with coronary angiogram N/A 01/09/2013    Procedure: LEFT HEART CATHETERIZATION WITH CORONARY ANGIOGRAM;  Surgeon: Burnell Blanks, MD;  Location: [facility] CATH LAB;  Service: Cardiovascular;  Laterality: N/A;  . Cardiac catheterization N/A 05/26/2015    Procedure: Left Heart Cath and Coronary Angiography;  Surgeon: Leonie Man, MD;  Location: [place] CV LAB;  Service: Cardiovascular;  Laterality: N/A;  . Cardiac catheterization N/A 05/26/2015    Procedure: Intravascular Pressure Wire/FFR Study;  Surgeon: Leonie Man, MD;  Location: [place] CV LAB;  Service: Cardiovascular;  Laterality: N/A;   Family History  Problem  Relation Age of Onset  . Diabetes Father   . Heart failure Mother     started in 27s  . Lupus Sister   . Heart attack Maternal Grandfather   . Heart attack Maternal Uncle   . Heart attack Maternal Aunt   . Hypertension Maternal Aunt   . Stroke Neg Hx    Social History  Substance Use Topics  . Smoking status: Former Smoker -- 2.00 packs/day for 20 years    Types: Cigarettes    Quit date: 08/06/1998  . Smokeless tobacco: Not on file  . Alcohol Use: No    Review of Systems  Constitutional: Negative for fever and fatigue.  Respiratory: Negative for shortness of breath.   Cardiovascular: Positive for chest pain.  Gastrointestinal: Negative for nausea, vomiting and abdominal pain.  All other systems reviewed and are negative.     Allergies   Diphenhydramine; Penicillins; Adhesive; Amoxicillin; Other; and Penicillin g  Home Medications   Prior to Admission medications   Medication Sig Start Date End Date Taking? Authorizing Provider  acetaminophen (TYLENOL) 500 MG tablet Take 1,000 mg by mouth every 6 (six) hours as needed for pain or fever.   Yes Historical Provider, MD  albuterol (PROAIR HFA) 108 (90 BASE) MCG/ACT inhaler Inhale 1-2 puffs into the lungs every 6 (six) hours as needed for shortness of breath.  07/19/11  Yes Historical Provider, MD  aspirin EC 81 MG tablet Take 1 tablet (81 mg total) by mouth daily. 02/03/16  Yes Liliane Shi, PA-C  atorvastatin (LIPITOR) 20 MG tablet Take 10 mg by mouth daily.   Yes Historical Provider, MD  calcium acetate (PHOSLO) 667 MG capsule Take 4,002 mg by mouth 3 (three) times daily with meals.    Yes Historical Provider, MD  citalopram (CELEXA) 20 MG tablet Take 20 mg by mouth daily.   Yes Historical Provider, MD  docusate sodium (COLACE) 100 MG capsule Take 400 mg by mouth 2 (two) times daily.   Yes Historical Provider, MD  folic acid (FOLVITE) 1 MG tablet Take 1 mg by mouth daily.   Yes Historical Provider, MD  gabapentin (NEURONTIN) 300 MG capsule Take 300 mg by mouth at bedtime.    Yes Historical Provider, MD  metoprolol tartrate (LOPRESSOR) 25 MG tablet Take 25 mg by mouth 2 (two) times daily.    Yes Historical Provider, MD  multivitamin (RENA-VIT) TABS tablet Take 1 tablet by mouth daily. 01/11/13  Yes Samella Parr, NP  nitroGLYCERIN (NITROSTAT) 0.4 MG SL tablet Place 0.4 mg under the tongue every 5 (five) minutes as needed for chest pain (Take one tabe every 5-10 minutes for chest pain. Once you have taken three tabs you need to notify your MD or go to ER if chest pain not gone.).   Yes Historical Provider, MD  ranitidine (ZANTAC) 150 MG tablet Take 150 mg by mouth daily.   Yes Historical Provider, MD  sodium polystyrene (KAYEXALATE) 15 GM/60ML suspension Take 15 g by mouth See  admin instructions. Only take 15 g on Sun / Tues / Thurs / Sat (non dialysis days)   Yes Historical Provider, MD  warfarin (COUMADIN) 7.5 MG tablet Take 1/2-1 tablet by mouth daily as directed by coumadin clinic Patient taking differently: Take 3.75 mg by mouth See admin instructions. Pt take 3.'75mg'$  on Tu, Th - pt take 7.'5mg'$  all other days 01/06/16  Yes Brittainy Erie Noe, PA-C   BP 164/89 mmHg  Pulse 69  Temp(Src) 97.9 F (36.6 C) (  Oral)  Resp 17  Ht '5\' 10"'$  (1.778 m)  Wt 86.2 kg  BMI 27.27 kg/m2  SpO2 95% Physical Exam  Constitutional: He appears well-developed and well-nourished.  HENT:  Head: Normocephalic and atraumatic.  Right Ear: External ear normal.  Left Ear: External ear normal.  Eyes: Conjunctivae are normal. Right eye exhibits no discharge. Left eye exhibits no discharge. No scleral icterus.  Neck: Neck supple. No tracheal deviation present.  Cardiovascular: Normal rate, regular rhythm and intact distal pulses.   Pulmonary/Chest: Effort normal and breath sounds normal. No stridor. No respiratory distress. He has no wheezes. He has no rales.  Abdominal: Soft. Bowel sounds are normal. He exhibits no distension. There is no tenderness. There is no rebound and no guarding.  Musculoskeletal: He exhibits no edema or tenderness.  Av fistula lue, normal perfusion distally  Neurological: He is alert. He has normal strength. No cranial nerve deficit (no facial droop, extraocular movements intact, no slurred speech) or sensory deficit. He exhibits normal muscle tone. He displays no seizure activity. Coordination normal.  Skin: Skin is warm and dry. No rash noted.  Psychiatric: He has a normal mood and affect.  Nursing note and vitals reviewed.   ED Course  .Critical Care Performed by: Dorie Rank Authorized by: Dorie Rank Total critical care time: 30 minutes Critical care was necessary to treat or prevent imminent or life-threatening deterioration of the following conditions:  stemi. Critical care was time spent personally by me on the following activities: development of treatment plan with patient or surrogate, discussions with consultants, discussions with primary provider, evaluation of patient's response to treatment, examination of patient, obtaining history from patient or surrogate, ordering and performing treatments and interventions, ordering and review of laboratory studies, pulse oximetry, re-evaluation of patient's condition and review of old charts.    Code stemi activated immediately upon review of EKG at approx 0705. Discussed case with Dr Fletcher Anon.    Pt still having pain.  Will give one dose of morphine, 4 mg while waiting for cath lab.  Otherwise vitals stable at 0732. 02 sat at 89% .  PCXR added on.  Will hold off on heparin infusion, while waiting for coags considering his coumadin use.  Coags are pending.    ED Meds 02/15/16 0729 02/15/16 0729  morphine 4 MG/ML injection 4 mg Every 30 min PRN Discontinue  Route: Intravenous Ordered Dose: 4 mg    Last MAR action: MAR Unhold Rosevelt Luu    02/15/16 0715 02/15/16 0706  0.9 % sodium chloride infusion Continuous Discontinue  Route: Intravenous Ordered Dose: 10-20 mL/hr    Last MAR action: New Bag/Given Alzina Golda   02/15/16 0715 02/15/16 0706  aspirin chewable tablet 324 mg Once   Route: Oral Ordered Dose: 324 mg    Last MAR action: Given Dayanira Giovannetti   02/15/16 0715 02/15/16 0706  heparin injection 4,000 Units STAT   Route: Intravenous Ordered Dose: 4,000 Units    Last MAR action: Given Wilford Merryfield      Labs Review  Labs Reviewed  CBC - Abnormal; Notable for the following:    RBC 3.74 (*)    Hemoglobin 11.7 (*)    HCT 37.3 (*)    All other components within normal limits  PROTIME-INR - Abnormal; Notable for the following:    Prothrombin Time 31.7 (*)    INR 3.14 (*)    All other components within normal limits  APTT - Abnormal; Notable for the following:    aPTT  59 (*)    All  other components within normal limits  COMPREHENSIVE METABOLIC PANEL - Abnormal; Notable for the following:    Chloride 99 (*)    Glucose, Bld 111 (*)    BUN 52 (*)    Creatinine, Ser 8.66 (*)    GFR calc non Af Amer 6 (*)    GFR calc Af Amer 7 (*)    Anion gap 17 (*)    All other components within normal limits  TROPONIN I - Abnormal; Notable for the following:    Troponin I 0.15 (*)    All other components within normal limits  LIPID PANEL - Abnormal; Notable for the following:    Triglycerides 156 (*)    HDL 27 (*)    All other components within normal limits  TROPONIN I - Abnormal; Notable for the following:    Troponin I 14.20 (*)    All other components within normal limits  MRSA PCR SCREENING  DIFFERENTIAL  TROPONIN I  TROPONIN I  PHOSPHORUS  POCT ACTIVATED CLOTTING TIME  POCT ACTIVATED CLOTTING TIME  POCT ACTIVATED CLOTTING TIME    Imaging Review Dg Chest Portable 1 View  02/15/2016  CLINICAL DATA:  Chest pain, post cardiac stent placement EXAM: PORTABLE CHEST 1 VIEW COMPARISON:  05/20/2015 and 02/21/2015 FINDINGS: Borderline cardiomegaly. Status post median sternotomy. No acute infiltrate or pulmonary edema. Stable right perihilar scarring. A broken sternotomy wire is noted in right apical chest wall. No pneumothorax. IMPRESSION: No active disease. Status post median sternotomy. Stable right perihilar scarring. Electronically Signed   By: Lahoma Crocker M.D.   On: 02/15/2016 11:50   I have personally reviewed and evaluated these images and lab results as part of my medical decision-making.   EKG Interpretation   Date/Time:  Sunday February 15 2016 06:55:28 EDT Ventricular Rate:  68 PR Interval:  200 QRS Duration: 106 QT Interval:  392 QTC Calculation: 417 R Axis:   25 Text Interpretation:  Sinus rhythm Consider left atrial enlargement  Anterior infarct, acute (LAD) ** ** ACUTE MI / STEMI ** ** Confirmed by  Lexis Potenza  MD-J, Flecia Shutter ([id number]) on 02/15/2016 7:12:51 AM      MDM    Final diagnoses:  ST elevation myocardial infarction (STEMI), unspecified artery ([place])   Pt presented to the ED with STEMI.  Remained stable in the ED.  Code STEMI activated for direct PCI.  Pt transported to the cath lab in stable condition.      Dorie Rank, MD 02/15/16 1620

## 2016-02-15 NOTE — Consult Note (Signed)
[name] is an 60 y.o. male referred by Dr Angelena Form   Chief Complaint: ESRD HPI: 60yo WM with ESRD admitted for CP and taken to cath lab where he underwent PCI/DES of SVG-LAD.  He has ESRD sec membranous and started HD in 2000, was tx in 2008 and went back on HD in 2011.  Says he dialyses for 4hr, MWF, in , and Dw is 83kg.  Currently he feels well and has no CP.  Past Medical History  Diagnosis Date  . 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 04/2013: s/p CABG x 2 (Y SVG -LAD & D2). b. 01/2015: NSTEMI s/p DES to LCx and BMS to SVG-LAD. c. NSTEMI 05/2015 in setting of atrial arrhythmias - cath without indication for PCI.  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 Peripheral vascular disease ([place])     Cath in 01/2015 required 45 cm Destination Sheath  . Sleep apnea     a. intolerant of bipap.  Marland Kitchen PAF (paroxysmal atrial fibrillation) ([place])   . Multiple sclerosis ([place])   . Renal transplant failure and rejection   . 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 b. He had a renal Tx from 2008 to 2011, but subsequent rejection - Gets HD in [place], [place] on MWF schedule.    Marland Kitchen Hypertension   . Multiple sclerosis ([place])   . SVT (supraventricular tachycardia) ([place])   . Paroxysmal atrial flutter ([place])     a. During 05/2015 admission - SVT, atrial flutter, and PAF.  Marland Kitchen Anemia   . Valvular heart disease     a. 2D echo 05/2015: EF 55-60%, no RWMA, mild AS/mild AI, mod MS, severely dilated LA.  . Carotid artery disease ([place])     a. 1-39% stenosis bilaterally in 06/2015.     Past Surgical History  Procedure Laterality Date  . Av fistula placement    . Flash      flash pulmonary edema  . Kidney transplant  08/2007  . Hernia repair  07/2011  . Excised squamous cells at rectum  2006  . Left heart catheterization with coronary angiogram N/A  01/09/2013    Procedure: LEFT HEART CATHETERIZATION WITH CORONARY ANGIOGRAM;  Surgeon: Burnell Blanks, MD;  Location: [facility] CATH LAB;  Service: Cardiovascular;  Laterality: N/A;  . Cardiac catheterization N/A 05/26/2015    Procedure: Left Heart Cath and Coronary Angiography;  Surgeon: Leonie Man, MD;  Location: [place] CV LAB;  Service: Cardiovascular;  Laterality: N/A;  . Cardiac catheterization N/A 05/26/2015    Procedure: Intravascular Pressure Wire/FFR Study;  Surgeon: Leonie Man, MD;  Location: [place] CV LAB;  Service: Cardiovascular;  Laterality: N/A;    Family History  Problem Relation Age of Onset  . Diabetes Father   . Heart failure Mother     started in 43s  . Lupus Sister   . Heart attack Maternal Grandfather   . Heart attack Maternal Uncle   . Heart attack Maternal Aunt   . Hypertension Maternal Aunt   . Stroke Neg Hx    Social History:  reports that he quit smoking about 17 years ago. His smoking use included Cigarettes. He has a 40 pack-year smoking history. He does not have any smokeless tobacco history  on file. He reports that he does not drink alcohol or use illicit drugs.  Allergies:  Allergies  Allergen Reactions  . Diphenhydramine Itching    Only with IV doses. Tolerates oral.  . Penicillins Other (See Comments)    migraine  . Adhesive [Tape] Rash    Please use paper tape  . Amoxicillin Rash    migraine  . Other Rash    Please use paper tape  . Penicillin G Rash    headache    Medications Prior to Admission  Medication Sig Dispense Refill  . acetaminophen (TYLENOL) 500 MG tablet Take 1,000 mg by mouth every 6 (six) hours as needed for pain or fever.    Marland Kitchen albuterol (PROAIR HFA) 108 (90 BASE) MCG/ACT inhaler Inhale 1-2 puffs into the lungs every 6 (six) hours as needed for shortness of breath.     Marland Kitchen aspirin EC 81 MG tablet Take 1 tablet (81 mg total) by mouth daily.    Marland Kitchen atorvastatin (LIPITOR) 20 MG tablet Take 10 mg by mouth daily.     . calcium acetate (PHOSLO) 667 MG capsule Take 4,002 mg by mouth 3 (three) times daily with meals.     . citalopram (CELEXA) 20 MG tablet Take 20 mg by mouth daily.    Marland Kitchen docusate sodium (COLACE) 100 MG capsule Take 400 mg by mouth 2 (two) times daily.    . folic acid (FOLVITE) 1 MG tablet Take 1 mg by mouth daily.    Marland Kitchen gabapentin (NEURONTIN) 300 MG capsule Take 300 mg by mouth at bedtime.     . metoprolol tartrate (LOPRESSOR) 25 MG tablet Take 25 mg by mouth 2 (two) times daily.     . multivitamin (RENA-VIT) TABS tablet Take 1 tablet by mouth daily. 30 tablet 1  . nitroGLYCERIN (NITROSTAT) 0.4 MG SL tablet Place 0.4 mg under the tongue every 5 (five) minutes as needed for chest pain (Take one tabe every 5-10 minutes for chest pain. Once you have taken three tabs you need to notify your MD or go to ER if chest pain not gone.).    Marland Kitchen ranitidine (ZANTAC) 150 MG tablet Take 150 mg by mouth daily.    . sodium polystyrene (KAYEXALATE) 15 GM/60ML suspension Take 15 g by mouth See admin instructions. Only take 15 g on Sun / Tues / Thurs / Sat (non dialysis days)    . warfarin (COUMADIN) 7.5 MG tablet Take 1/2-1 tablet by mouth daily as directed by coumadin clinic (Patient taking differently: Take 3.75 mg by mouth See admin instructions. Pt take 3.'75mg'$  on Tu, Th - pt take 7.'5mg'$  all other days) 30 tablet 0     Lab Results: UA: ND   Recent Labs  02/15/16 0708  WBC 8.9  HGB 11.7*  HCT 37.3*  PLT 165   BMET  Recent Labs  02/15/16 0708  NA 140  K 4.4  CL 99*  CO2 24  GLUCOSE 111*  BUN 52*  CREATININE 8.66*  CALCIUM 9.5   LFT  Recent Labs  02/15/16 0708  PROT 7.6  ALBUMIN 3.5  AST 20  ALT 17  ALKPHOS 68  BILITOT 0.7   Dg Chest Portable 1 View  02/15/2016  CLINICAL DATA:  Chest pain, post cardiac stent placement EXAM: PORTABLE CHEST 1 VIEW COMPARISON:  05/20/2015 and 02/21/2015 FINDINGS: Borderline cardiomegaly. Status post median sternotomy. No acute infiltrate or pulmonary  edema. Stable right perihilar scarring. A broken sternotomy wire is noted in right apical chest wall.  No pneumothorax. IMPRESSION: No active disease. Status post median sternotomy. Stable right perihilar scarring. Electronically Signed   By: Lahoma Crocker M.D.   On: 02/15/2016 11:50    ROS: Appetite good No SOB No cp No ABd pain, no change in bowel habits No dysuria Chronic pain hips and knees   PHYSICAL EXAM: Blood pressure 164/89, pulse 69, temperature 97.9 F (36.6 C), temperature source Oral, resp. rate 17, height '5\' 10"'$  (1.778 m), weight 86.2 kg (190 lb 0.6 oz), SpO2 95 %. HEENT: PERRLA EOMI NECK:No JVD LUNGS:Clear CARDIAC:RRR 1/6 systolic M ABD:+ BS NTND No HSM  Kidney palpable RLQ EXT:tr edema.  Discoloration LLE.  AVF Lt arm + bruit NEURO:CNI M&SI Ox3 no asterixis  Assessment: 1. CP SP PCI/DES SVG-LAD 2. ESRD 3. HTN 4. Anemia 5. Sec HPTH PLAN: 1. HD tomorrow 2. Will need to get info from [facility] center 3. Check PO4   [name] T 02/15/2016, 2:53 PM

## 2016-02-16 ENCOUNTER — Encounter (HOSPITAL_COMMUNITY): Payer: Self-pay | Admitting: Cardiovascular Disease

## 2016-02-16 DIAGNOSIS — Z7901 Long term (current) use of anticoagulants: Secondary | ICD-10-CM

## 2016-02-16 DIAGNOSIS — Z9861 Coronary angioplasty status: Secondary | ICD-10-CM

## 2016-02-16 DIAGNOSIS — I2510 Atherosclerotic heart disease of native coronary artery without angina pectoris: Secondary | ICD-10-CM

## 2016-02-16 DIAGNOSIS — Z951 Presence of aortocoronary bypass graft: Secondary | ICD-10-CM

## 2016-02-16 DIAGNOSIS — I25709 Atherosclerosis of coronary artery bypass graft(s), unspecified, with unspecified angina pectoris: Secondary | ICD-10-CM

## 2016-02-16 DIAGNOSIS — N186 End stage renal disease: Secondary | ICD-10-CM

## 2016-02-16 DIAGNOSIS — Z954 Presence of other heart-valve replacement: Secondary | ICD-10-CM

## 2016-02-16 DIAGNOSIS — I480 Paroxysmal atrial fibrillation: Secondary | ICD-10-CM

## 2016-02-16 LAB — PROTIME-INR
INR: 4.62 — ABNORMAL HIGH (ref 0.00–1.49)
INR: 4.28 — ABNORMAL HIGH (ref 0.00–1.49)
Prothrombin Time: 42.3 seconds — ABNORMAL HIGH (ref 11.6–15.2)
Prothrombin Time: 40 seconds — ABNORMAL HIGH (ref 11.6–15.2)

## 2016-02-16 LAB — BASIC METABOLIC PANEL
ANION GAP: 13 (ref 5–15)
BUN: 62 mg/dL — AB (ref 6–20)
CHLORIDE: 98 mmol/L — AB (ref 101–111)
CO2: 24 mmol/L (ref 22–32)
Calcium: 8.4 mg/dL — ABNORMAL LOW (ref 8.9–10.3)
Creatinine, Ser: 9.89 mg/dL — ABNORMAL HIGH (ref 0.61–1.24)
GFR, EST AFRICAN AMERICAN: 6 mL/min — AB (ref >60)
GFR, EST NON AFRICAN AMERICAN: 5 mL/min — AB (ref >60)
Glucose, Bld: 84 mg/dL (ref 65–99)
POTASSIUM: 5.4 mmol/L — AB (ref 3.5–5.1)
SODIUM: 135 mmol/L (ref 135–145)

## 2016-02-16 LAB — POCT ACTIVATED CLOTTING TIME
ACTIVATED CLOTTING TIME: 224 s
Activated Clotting Time: 213 seconds
Activated Clotting Time: 213 seconds
Activated Clotting Time: 208 seconds
Activated Clotting Time: 202 seconds
Activated Clotting Time: 208 seconds

## 2016-02-16 LAB — CBC
HCT: 28.7 % — ABNORMAL LOW (ref 39.0–52.0)
HEMATOCRIT: 28.5 % — AB (ref 39.0–52.0)
HEMATOCRIT: 28.8 % — AB (ref 39.0–52.0)
HEMOGLOBIN: 9.4 g/dL — AB (ref 13.0–17.0)
Hemoglobin: 9.2 g/dL — ABNORMAL LOW (ref 13.0–17.0)
Hemoglobin: 9.1 g/dL — ABNORMAL LOW (ref 13.0–17.0)
MCH: 32.4 pg (ref 26.0–34.0)
MCH: 31.5 pg (ref 26.0–34.0)
MCH: 31.3 pg (ref 26.0–34.0)
MCHC: 33 g/dL (ref 30.0–36.0)
MCHC: 31.9 g/dL (ref 30.0–36.0)
MCHC: 31.7 g/dL (ref 30.0–36.0)
MCV: 98.3 fL (ref 78.0–100.0)
MCV: 98.6 fL (ref 78.0–100.0)
MCV: 98.6 fL (ref 78.0–100.0)
Platelets: 156 10*3/uL (ref 150–400)
Platelets: 148 10*3/uL — ABNORMAL LOW (ref 150–400)
Platelets: 159 10*3/uL (ref 150–400)
RBC: 2.9 MIL/uL — AB (ref 4.22–5.81)
RBC: 2.92 MIL/uL — ABNORMAL LOW (ref 4.22–5.81)
RBC: 2.91 MIL/uL — ABNORMAL LOW (ref 4.22–5.81)
RDW: 14.5 % (ref 11.5–15.5)
RDW: 14.7 % (ref 11.5–15.5)
RDW: 14.8 % (ref 11.5–15.5)
WBC: 7.6 10*3/uL (ref 4.0–10.5)
WBC: 8.8 10*3/uL (ref 4.0–10.5)
WBC: 7.1 10*3/uL (ref 4.0–10.5)

## 2016-02-16 MED ORDER — PHYTONADIONE 5 MG PO TABS
5.0000 mg | ORAL_TABLET | Freq: Once | ORAL | Status: AC
  Administered 2016-02-16: 5 mg via ORAL
  Filled 2016-02-16: qty 1

## 2016-02-16 MED FILL — Verapamil HCl IV Soln 2.5 MG/ML: INTRAVENOUS | Qty: 2 | Status: AC

## 2016-02-16 NOTE — Progress Notes (Signed)
Spoke with Rosaria Ferries, P.A. regarding BPs in 80s.  No new orders given since Nephrololgy MD is also aware.  Will continue to maintain MAP >65 and patient alert, oriented and not symptomatic.  Willcontinue to monitor for any changes.

## 2016-02-16 NOTE — Progress Notes (Signed)
ACT remains greater than 200 at 04:00, hematoma increased more than double in size despite manual pressure held >1 hr.   Per MD at bedside sheath removed 06:20. Atropine at bedside. Manual pressure held for 30 minutes with minimal oozing at site, pedal pulses 1+. Right thigh still appears taut and edematous but nontender, MD aware of appearance at that time. Very large hematoma present and marked.   Post removal site is soft with large marked bruising. Cardiology MD aware of bruising and edematous right thigh.  Verbal orders received to administer 10 mg protamine if site begins to ooze or bleeding is not controlled.

## 2016-02-16 NOTE — Progress Notes (Signed)
Arrived at patient room 2H-06 at 1415.  Reviewed treatment plan and this RN agrees.  Report received from bedside RN, Lavella Lemons.  Consent obatined.  Patient A & o X 4. Lung sounds clear to ausculation in all fields. generalized edema. Cardiac: NSR.  Prepped LLAVF with alcohol and cannulated with two 15 gauge needles.  Pulsation of blood noted.  Flushed access well with saline per protocol.  Connected and secured lines and initiated tx at 1437.  UF goal of 5000 mL and net fluid removal of 4500 mL.  Will continue to monitor.

## 2016-02-16 NOTE — Progress Notes (Signed)
Dialysis treatment completed.  2200 mL ultrafiltrated and net fluid removal 1500 mL.    Patient status unchanged. Lung sounds clear to ausculation in all fields. Generalized edema. Cardiac: NSR.  Disconnected lines and removed needles.  Pressure held for 10 minutes and band aid/gauze dressing applied.  Report given to bedside RN, Lavella Lemons.

## 2016-02-16 NOTE — Progress Notes (Signed)
Sheath still in place, most recent ACT 203 at 03:15. Hematoma at site has doubled in size since assessment at 02:00. Cardiology MD aware and given verbal order to pull sheath if ACT <185 on recheck.   Potassium 5.4 on morning labs, MD notified and no new orders at this time.

## 2016-02-16 NOTE — Progress Notes (Signed)
ACT remains elevated on multiple checks, most recent value 223 at 00:30. Cardiology MD paged and advised to defer sheath removal until ACT <175.  Site remains a level 1 with a marked stable hematoma. Will continue to monitor.

## 2016-02-16 NOTE — Progress Notes (Signed)
Per Cardiology- hold morning dose of plavix due to Large right groin hematoma. Will continue to monitor closely.

## 2016-02-16 NOTE — Progress Notes (Signed)
Per Cardiology give patient Plavix and vitamin K, Right groin hematoma marked and looks the same as this morning. Right pedal pulse +1. Will continue to monitor closely.

## 2016-02-16 NOTE — Progress Notes (Signed)
Called by nurse because of increasing hematoma.  According to chart patient had oozing post cath and moderate hematoma with upgrade to sheath to 41F. ACT still elevated greater than 200.  Manual pressure held with sheath in but still with hematoma and some edema in upper leg.  Due for dialysis today.   Asked for nurse to check another ACT still 208.  Spoke to Dr. Fletcher Anon who said to remove sheath and hold pressure.   Kerry Hough MD [facility] 6:12 AM

## 2016-02-16 NOTE — Progress Notes (Signed)
Pt still on bedrest and planning HD in a few minutes. Began ed with pt and wife including MI, stent, antiplatelet tx, restrictions. Voiced understanding. Will f/u tomorrow for ambulation. Pt sts he "never needed CRPII" although he was referred last year. Will discuss more. [place] CES, ACSM 2:18 PM 02/16/2016

## 2016-02-16 NOTE — Progress Notes (Addendum)
Patient Name: [name] Date of Encounter: 02/16/2016   Principal Problem:   ST elevation myocardial infarction (STEMI) [facility]) Active Problems:   Renal transplant failure and rejection   Multiple sclerosis ([place])   OSA -not compliant with C-pap   PAF (paroxysmal atrial fibrillation) ([place])   ESRD on hemodialysis   CAD of artery bypass graft-occluded SVG-LAD 02/15/16   Essential (primary) hypertension   CAD- S/P PCI/DES to SVG-LAD 02/15/16   Chronic anticoagulation-Coumadin   History of tissue AVR 2014   Hx of CABG x 2 2014   Acute ST elevation myocardial infarction (STEMI) ([place])   STEMI (ST elevation myocardial infarction) ([place])   SUBJECTIVE  No more CP, no abdominal pain, denies any back pain. Groin sore, but not severely tender. Denies any numbness or loss of sensation in RLE  CURRENT MEDS . aspirin EC  81 mg Oral Daily  . atorvastatin  40 mg Oral QHS  . calcium acetate  4,002 mg Oral TID WC  . citalopram  20 mg Oral Daily  . clopidogrel  75 mg Oral Q breakfast  . docusate sodium  200 mg Oral BID  . famotidine  20 mg Oral Daily  . folic acid  1 mg Oral Daily  . gabapentin  300 mg Oral QHS  . metoprolol tartrate  25 mg Oral BID  . multivitamin  1 tablet Oral Daily  . sodium chloride flush  3 mL Intravenous Q12H  . sodium chloride flush  3 mL Intravenous Q12H  . sodium chloride flush  3 mL Intravenous Q12H  . [START ON 02/17/2016] sodium polystyrene  15 g Oral Q T,Th,Sat-1800    OBJECTIVE  Filed Vitals:   02/15/16 2300 02/16/16 0000 02/16/16 0300 02/16/16 0732  BP:      Pulse: 69 70    Temp: 97.7 F (36.5 C)  97.8 F (36.6 C) 97.6 F (36.4 C)  TempSrc: Oral  Oral Oral  Resp: 18 16    Height:      Weight:      SpO2: 96% 96%      Intake/Output Summary (Last 24 hours) at 02/16/16 0802 Last data filed at 02/16/16 0000  Gross per 24 hour  Intake    150 ml  Output      0 ml  Net    150 ml   Filed Weights   02/15/16 0658 02/15/16 1015  Weight: 185 lb  (83.915 kg) 190 lb 0.6 oz (86.2 kg)    PHYSICAL EXAM  General: Pleasant, NAD. Neuro: Alert and oriented X 3. Moves all extremities spontaneously. Psych: Normal affect. HEENT:  Normal  Neck: Supple without bruits or JVD. Lungs:  Resp regular and unlabored, CTA. Heart: RRR no s3, s4, or murmurs. Abdomen: Soft, non-tender, non-distended, BS + x 4. Large hematoma noted in R femoral area, skin in R flank taunt, weak 1+ pulse in RLE Extremities: No clubbing, cyanosis or edema.   Accessory Clinical Findings  CBC  Recent Labs  02/15/16 0708 02/16/16 0315  WBC 8.9 7.6  NEUTROABS 7.2  --   HGB 11.7* 9.4*  HCT 37.3* 28.5*  MCV 99.7 98.3  PLT 165 465   Basic Metabolic Panel  Recent Labs  02/15/16 0708 02/15/16 1630 02/16/16 0315  NA 140  --  135  K 4.4  --  5.4*  CL 99*  --  98*  CO2 24  --  24  GLUCOSE 111*  --  84  BUN 52*  --  62*  CREATININE  8.66*  --  9.89*  CALCIUM 9.5  --  8.4*  PHOS  --  7.9*  --    Liver Function Tests  Recent Labs  02/15/16 0708  AST 20  ALT 17  ALKPHOS 68  BILITOT 0.7  PROT 7.6  ALBUMIN 3.5   Cardiac Enzymes  Recent Labs  02/15/16 1100 02/15/16 1630 02/15/16 2220  TROPONINI 14.20* >65.00* >65.00*   Fasting Lipid Panel  Recent Labs  02/15/16 0708  CHOL 105  HDL 27*  LDLCALC 47  TRIG 156*  CHOLHDL 3.9    TELE NSR    ECG  NSR with bifascicular block  Echocardiogram 02/15/2016  LV EF: 55% - 60%  ------------------------------------------------------------------- Indications: Chest pain 786.51.  ------------------------------------------------------------------- History: PMH: CABG, PAF, SVT. Coronary artery disease.  ------------------------------------------------------------------- Study Conclusions  - Left ventricle: The cavity size was normal. Systolic function was  normal. The estimated ejection fraction was in the range of 55%  to 60%. There is hypokinesis of the  basal-midanteroseptal  myocardium. There is hypokinesis of the apicalanterior  myocardium. - Aortic valve: Severely calcified annulus. Trileaflet. Mild  diffuse thickening and calcification. There was very mild  stenosis. There was mild regurgitation. Valve area (VTI): 1.51  cm^2. Valve area (Vmax): 1.45 cm^2. Valve area (Vmean): 1.33  cm^2. - Mitral valve: Severely calcified annulus. Severe diffuse  thickening and calcification of the posterior leaflet, with  moderate involvement of chords. Mild diffuse calcification of the  anterior leaflet, with moderate involvement of chords. Mobility  of the posterior leaflet was restricted to the point of  immobility. There was mild regurgitation. - Left atrium: The atrium was moderately dilated. - Pulmonary arteries: PA peak pressure: 51 mm Hg (S).  Impressions:  - The right ventricular systolic pressure was increased consistent  with moderate pulmonary hypertension.    Radiology/Studies  Dg Chest Portable 1 View  02/15/2016  CLINICAL DATA:  Chest pain, post cardiac stent placement EXAM: PORTABLE CHEST 1 VIEW COMPARISON:  05/20/2015 and 02/21/2015 FINDINGS: Borderline cardiomegaly. Status post median sternotomy. No acute infiltrate or pulmonary edema. Stable right perihilar scarring. A broken sternotomy wire is noted in right apical chest wall. No pneumothorax. IMPRESSION: No active disease. Status post median sternotomy. Stable right perihilar scarring. Electronically Signed   By: Lahoma Crocker M.D.   On: 02/15/2016 11:50    ASSESSMENT AND PLAN  60 yo male with PMH of PAF on coumadin, CAD s/p 2V CABG, ESRD with h/o rejected transplant current being evaluated for repeat transplant who stopped his plavix a week ago pending renal eval presented with anterior STEMI  1. Anterior STEMI  - significant myocardial injury with post cath trop > 65 and new prominent bifascicular block post cath  - cath 02/15/2016 100% distal SVG to LAD  stenosis treated with 2.5x 32m Promus DES. Recommend DAPT for at least 1 year and ideally indefinitely given multiple stents.   - per cath report, "patient can ultimately be treated with Plavix and warfarin without aspirin after at least 1-2 weeks of triple therapy."  - echo 02/15/2016 EF 55-60%, hypokinesis of basal-midanteroseptal myocardium, mild AR/AS, severe diffuse thickening of mitral valve with immobile posterior leaflet, PA peak pressure 540mg  - currently on ASA only, holding coumadin given hematoma, will discuss with MD, likely still given plavix  2. Large R femoral hematoma post cath  - in the setting of supratherapeutic INR, INR went up to 4.62 this morning, but maybe result of intracath IV medication. Pre cath INR 3.14. Will recheck  INR, hold of restarting coumadin until hematoma stabilize. Hgb dropped by 2 units, will repeat CBC around noon as well.   - if has worsening pain, continuous drop in hgb or loss of pulse in RLE, may need to call vascular surgery  3. CAD s/p 2v CABG 2014 (Y SVG to LAD and D2) with tissue AVR  - NSTEMI 01/2015- s/p DES to LCx and BMS to SVG-LAD at [facility]  - 05/2015 Myoview was high risk. LHC demonstrated patent bypass graft and mid LCx ISR of 65% that was not hemodynamically significant by FFR. Treated medically.  4. ESRD on HD MWF (previous s/p renal transplant with subsequent rejection)  - pending evaluation for repeat renal transplant  - pending HD today  5. PAF on coumadin  6. OSA   Signed, Almyra Deforest PA-C Pager: [phone]  The patient was seen, examined and discussed with Almyra Deforest, PA-C and I agree with the above.   60 year old male post anterior STEMI on  02/15/2016 100% distal SVG to LAD stenosis treated with 2.5x 26m Promus DES. Troponin > 65.  Recommend DAPT for at least 1 year and ideally indefinitely given multiple stents. He was on Coumadin for PAF, INR yesterday 3.1 today 4.6, he developed a large right groin hematoma with Hb 11.7 --> 9.4,  vitals are stable, soft BP, he absolutely needs ASA/Plavix, we will give 5 mg of vitamin today to reverse INR and follow closely. He had some reperfusion nsVTs yesterday - at most 3 beats, none today.  KEna Dawley6/08/2016

## 2016-02-16 NOTE — Progress Notes (Signed)
  [facility] Progress Note   Subjective: no c/o  Filed Vitals:   02/16/16 0900 02/16/16 1000 02/16/16 1100 02/16/16 1224  BP: 101/74 111/77 103/64   Pulse: 74 70 71   Temp:    97.5 F (36.4 C)  TempSrc:    Oral  Resp: '17 17 17   '$ Height:      Weight:      SpO2: 89% 94% 94%     Inpatient medications: . aspirin EC  81 mg Oral Daily  . atorvastatin  40 mg Oral QHS  . calcium acetate  4,002 mg Oral TID WC  . citalopram  20 mg Oral Daily  . clopidogrel  75 mg Oral Q breakfast  . docusate sodium  200 mg Oral BID  . famotidine  20 mg Oral Daily  . folic acid  1 mg Oral Daily  . gabapentin  300 mg Oral QHS  . metoprolol tartrate  25 mg Oral BID  . multivitamin  1 tablet Oral Daily  . sodium chloride flush  3 mL Intravenous Q12H  . sodium chloride flush  3 mL Intravenous Q12H  . sodium chloride flush  3 mL Intravenous Q12H  . [START ON 02/17/2016] sodium polystyrene  15 g Oral Q T,Th,Sat-1800   . sodium chloride Stopped (02/16/16 0900)   sodium chloride, sodium chloride, sodium chloride, acetaminophen, albuterol, ALPRAZolam, morphine injection, nitroGLYCERIN, ondansetron (ZOFRAN) IV, sodium chloride flush, sodium chloride flush, sodium chloride flush, zolpidem  Exam: Alert, no distress NECK:No JVD LUNGS:Clear CARDIAC:RRR 1/6 systolic M ABD:+ BS NTND No HSM Kidney palpable RLQ EXT:tr edema. Discoloration LLE. AVF Lt arm + bruit NEURO:CNI M&SI Ox3 no asterixis   Dialysis: MWF  Dry wt 83kg  Assessment: 1. CP SP PCI/DES SVG-LAD 2. ESRD 3. HTN 4. Anemia 5. Sec [place]  Plan - HD today   Kelly Splinter MD [facility] pager [phone]    cell [phone] 02/16/2016, 1:30 PM    Recent Labs Lab 02/15/16 0708 02/15/16 1630 02/16/16 0315  NA 140  --  135  K 4.4  --  5.4*  CL 99*  --  98*  CO2 24  --  24  GLUCOSE 111*  --  84  BUN 52*  --  62*  CREATININE 8.66*  --  9.89*  CALCIUM 9.5  --  8.4*  PHOS  --  7.9*  --     Recent  Labs Lab 02/15/16 0708  AST 20  ALT 17  ALKPHOS 68  BILITOT 0.7  PROT 7.6  ALBUMIN 3.5    Recent Labs Lab 02/15/16 0708 02/16/16 0315 02/16/16 1134  WBC 8.9 7.6 8.8  NEUTROABS 7.2  --   --   HGB 11.7* 9.4* 9.2*  HCT 37.3* 28.5* 28.8*  MCV 99.7 98.3 98.6  PLT 165 156 148*   Iron/TIBC/Ferritin/ %Sat No results found for: IRON, TIBC, FERRITIN, IRONPCTSAT

## 2016-02-16 NOTE — Progress Notes (Signed)
Nephrologist notified of BP intolerance to UFR.  Per nephrologist, OK to run patient with nonsymptomatic BP in 80's.  Will continue to monitor.

## 2016-02-17 ENCOUNTER — Encounter (HOSPITAL_COMMUNITY): Payer: Self-pay | Admitting: *Deleted

## 2016-02-17 DIAGNOSIS — I25700 Atherosclerosis of coronary artery bypass graft(s), unspecified, with unstable angina pectoris: Secondary | ICD-10-CM

## 2016-02-17 DIAGNOSIS — T8611 Kidney transplant rejection: Secondary | ICD-10-CM

## 2016-02-17 DIAGNOSIS — T8612 Kidney transplant failure: Secondary | ICD-10-CM

## 2016-02-17 DIAGNOSIS — I10 Essential (primary) hypertension: Secondary | ICD-10-CM

## 2016-02-17 LAB — CBC WITH DIFFERENTIAL/PLATELET
BASOS ABS: 0 10*3/uL (ref 0.0–0.1)
Basophils Relative: 0 %
EOS ABS: 0.2 10*3/uL (ref 0.0–0.7)
Eosinophils Relative: 3 %
HEMATOCRIT: 27 % — AB (ref 39.0–52.0)
HEMOGLOBIN: 8.5 g/dL — AB (ref 13.0–17.0)
LYMPHS ABS: 0.7 10*3/uL (ref 0.7–4.0)
Lymphocytes Relative: 8 %
MCH: 31.1 pg (ref 26.0–34.0)
MCHC: 31.5 g/dL (ref 30.0–36.0)
MCV: 98.9 fL (ref 78.0–100.0)
Monocytes Absolute: 1 10*3/uL (ref 0.1–1.0)
Monocytes Relative: 11 %
NEUTROS PCT: 78 %
Neutro Abs: 7.1 10*3/uL (ref 1.7–7.7)
Platelets: 145 10*3/uL — ABNORMAL LOW (ref 150–400)
RBC: 2.73 MIL/uL — ABNORMAL LOW (ref 4.22–5.81)
RDW: 14.7 % (ref 11.5–15.5)
WBC: 9 10*3/uL (ref 4.0–10.5)

## 2016-02-17 LAB — GLUCOSE, CAPILLARY: Glucose-Capillary: 152 mg/dL — ABNORMAL HIGH (ref 65–99)

## 2016-02-17 LAB — COMPREHENSIVE METABOLIC PANEL
ALT: 47 U/L (ref 17–63)
AST: 170 U/L — ABNORMAL HIGH (ref 15–41)
Albumin: 2.6 g/dL — ABNORMAL LOW (ref 3.5–5.0)
Alkaline Phosphatase: 50 U/L (ref 38–126)
Anion gap: 9 (ref 5–15)
BUN: 25 mg/dL — ABNORMAL HIGH (ref 6–20)
CHLORIDE: 97 mmol/L — AB (ref 101–111)
CO2: 29 mmol/L (ref 22–32)
CREATININE: 5.69 mg/dL — AB (ref 0.61–1.24)
Calcium: 8.2 mg/dL — ABNORMAL LOW (ref 8.9–10.3)
GFR, EST AFRICAN AMERICAN: 11 mL/min — AB (ref >60)
GFR, EST NON AFRICAN AMERICAN: 10 mL/min — AB (ref >60)
Glucose, Bld: 74 mg/dL (ref 65–99)
POTASSIUM: 4.2 mmol/L (ref 3.5–5.1)
Sodium: 135 mmol/L (ref 135–145)
Total Bilirubin: 0.5 mg/dL (ref 0.3–1.2)
Total Protein: 5.9 g/dL — ABNORMAL LOW (ref 6.5–8.1)

## 2016-02-17 LAB — PREPARE RBC (CROSSMATCH)

## 2016-02-17 LAB — PROTIME-INR
INR: 1.98 — ABNORMAL HIGH (ref 0.00–1.49)
Prothrombin Time: 22.4 seconds — ABNORMAL HIGH (ref 11.6–15.2)

## 2016-02-17 LAB — ABO/RH: ABO/RH(D): O POS

## 2016-02-17 MED ORDER — SODIUM CHLORIDE 0.9 % IV SOLN: Freq: Once | INTRAVENOUS | Status: DC

## 2016-02-17 NOTE — Care Management Important Message (Signed)
Important Message  Patient Details  Name: [name] MRN: [id number] Date of Birth: 1955-10-29   Medicare Important Message Given:  Yes    Loann Quill 02/17/2016, 8:35 AM

## 2016-02-17 NOTE — Progress Notes (Addendum)
  [facility] Progress Note   Subjective: no c/o  Filed Vitals:   02/17/16 0851 02/17/16 0900 02/17/16 1000 02/17/16 1250  BP: '93/62 93/61 95/65 '$ 105/69  Pulse: 77 80 74 79  Temp:    98.1 F (36.7 C)  TempSrc:    Oral  Resp: '20 16 18 15  '$ Height:      Weight:      SpO2: 97% 94% 95% 96%    Inpatient medications: . sodium chloride   Intravenous Once  . aspirin EC  81 mg Oral Daily  . atorvastatin  40 mg Oral QHS  . calcium acetate  4,002 mg Oral TID WC  . citalopram  20 mg Oral Daily  . clopidogrel  75 mg Oral Q breakfast  . docusate sodium  200 mg Oral BID  . famotidine  20 mg Oral Daily  . folic acid  1 mg Oral Daily  . gabapentin  300 mg Oral QHS  . metoprolol tartrate  25 mg Oral BID  . multivitamin  1 tablet Oral Daily  . sodium chloride flush  3 mL Intravenous Q12H  . sodium chloride flush  3 mL Intravenous Q12H  . sodium chloride flush  3 mL Intravenous Q12H  . sodium polystyrene  15 g Oral Q T,Th,Sat-1800   . sodium chloride Stopped (02/16/16 0900)   sodium chloride, sodium chloride, sodium chloride, acetaminophen, albuterol, ALPRAZolam, morphine injection, nitroGLYCERIN, ondansetron (ZOFRAN) IV, sodium chloride flush, sodium chloride flush, sodium chloride flush, zolpidem  Exam: Alert, no distress NECK:No JVD LUNGS:Clear CARDIAC:RRR 1/6 systolic M ABD:+ BS NTND No HSM Kidney palpable RLQ R groin hematoma substantial size, no chg from yest EXT:tr edema. AVF Lt arm + bruit NEURO:CNI M&SI Ox3 no asterixis  CXR 6/11 no active disease  Dialysis: MWF [facility]   4h  83kg  1K/ 2.25 bath  P2  Hep 2000   AVF L arm   Assessment: 1. CP SP PCI/DES to LAD SVG 2. ESRD 3. HTN 4. Anemia 5. Sec [place]  Plan - HD Wed, transfusion today   Kelly Splinter MD [facility] pager [phone]    cell [phone] 02/17/2016, 1:04 PM    Recent Labs Lab 02/15/16 0708 02/15/16 1630 02/16/16 0315 02/17/16 0154  NA 140  --  135 135  K 4.4  --   5.4* 4.2  CL 99*  --  98* 97*  CO2 24  --  24 29  GLUCOSE 111*  --  84 74  BUN 52*  --  62* 25*  CREATININE 8.66*  --  9.89* 5.69*  CALCIUM 9.5  --  8.4* 8.2*  PHOS  --  7.9*  --   --     Recent Labs Lab 02/15/16 0708 02/17/16 0154  AST 20 170*  ALT 17 47  ALKPHOS 68 50  BILITOT 0.7 0.5  PROT 7.6 5.9*  ALBUMIN 3.5 2.6*    Recent Labs Lab 02/15/16 0708  02/16/16 1134 02/16/16 1455 02/17/16 0154  WBC 8.9  < > 8.8 7.1 9.0  NEUTROABS 7.2  --   --   --  7.1  HGB 11.7*  < > 9.2* 9.1* 8.5*  HCT 37.3*  < > 28.8* 28.7* 27.0*  MCV 99.7  < > 98.6 98.6 98.9  PLT 165  < > 148* 159 145*  < > = values in this interval not displayed. Iron/TIBC/Ferritin/ %Sat No results found for: IRON, TIBC, FERRITIN, IRONPCTSAT

## 2016-02-17 NOTE — Progress Notes (Signed)
EKG CRITICAL VALUE     12 lead EKG performed.  Critical value noted.  Beatrix Shipper, RN notified.   Martie Lee, CCT 02/17/2016 6:46 AM

## 2016-02-17 NOTE — Progress Notes (Signed)
Patient Name: [name] Date of Encounter: 02/17/2016   Principal Problem:   ST elevation myocardial infarction (STEMI) [facility]) Active Problems:   Renal transplant failure and rejection   Multiple sclerosis ([place])   OSA -not compliant with C-pap   PAF (paroxysmal atrial fibrillation) ([place])   ESRD on hemodialysis   CAD of artery bypass graft-occluded SVG-LAD 02/15/16   Essential (primary) hypertension   CAD- S/P PCI/DES to SVG-LAD 02/15/16   Chronic anticoagulation-Coumadin   History of tissue AVR 2014   Hx of CABG x 2 2014   Acute ST elevation myocardial infarction (STEMI) ([place])   STEMI (ST elevation myocardial infarction) ([place])   SUBJECTIVE  No more CP, no abdominal pain, denies any back pain. Groin sore. Some numbness in RLE, worse at night. Per nurse gets hypoxic down to 85% at night.   CURRENT MEDS . sodium chloride   Intravenous Once  . aspirin EC  81 mg Oral Daily  . atorvastatin  40 mg Oral QHS  . calcium acetate  4,002 mg Oral TID WC  . citalopram  20 mg Oral Daily  . clopidogrel  75 mg Oral Q breakfast  . docusate sodium  200 mg Oral BID  . famotidine  20 mg Oral Daily  . folic acid  1 mg Oral Daily  . gabapentin  300 mg Oral QHS  . metoprolol tartrate  25 mg Oral BID  . multivitamin  1 tablet Oral Daily  . sodium chloride flush  3 mL Intravenous Q12H  . sodium chloride flush  3 mL Intravenous Q12H  . sodium chloride flush  3 mL Intravenous Q12H  . sodium polystyrene  15 g Oral Q T,Th,Sat-1800   . sodium chloride Stopped (02/16/16 0900)   OBJECTIVE  Filed Vitals:   02/17/16 0848 02/17/16 0851 02/17/16 0900 02/17/16 1000  BP:  '93/62 93/61 95/65 '$  Pulse: 77 77 80 74  Temp:      TempSrc:      Resp: '14 20 16 18  '$ Height:      Weight:      SpO2: 92% 97% 94% 95%    Intake/Output Summary (Last 24 hours) at 02/17/16 1209 Last data filed at 02/17/16 0900  Gross per 24 hour  Intake    480 ml  Output   1500 ml  Net  -1020 ml   Filed Weights   02/16/16  1415 02/16/16 1837 02/17/16 0319  Weight: 192 lb 10.9 oz (87.4 kg) 189 lb 6 oz (85.9 kg) 187 lb 2.7 oz (84.9 kg)    PHYSICAL EXAM  General: Pleasant, NAD. Neuro: Alert and oriented X 3. Moves all extremities spontaneously. Psych: Normal affect. HEENT:  Normal  Neck: Supple without bruits or JVD. Lungs:  Resp regular and unlabored, CTA. Heart: RRR no s3, s4, or murmurs. Abdomen: Soft, non-tender, non-distended, BS + x 4. Large hematoma noted in R femoral area, skin in R flank taunt, weak 1+ pulse in RLE Extremities: No clubbing, cyanosis or edema.   Accessory Clinical Findings  CBC  Recent Labs  02/15/16 0708  02/16/16 1455 02/17/16 0154  WBC 8.9  < > 7.1 9.0  NEUTROABS 7.2  --   --  7.1  HGB 11.7*  < > 9.1* 8.5*  HCT 37.3*  < > 28.7* 27.0*  MCV 99.7  < > 98.6 98.9  PLT 165  < > 159 145*  < > = values in this interval not displayed. Basic Metabolic Panel  Recent Labs  02/15/16 1630 02/16/16  0315 02/17/16 0154  NA  --  135 135  K  --  5.4* 4.2  CL  --  98* 97*  CO2  --  24 29  GLUCOSE  --  84 74  BUN  --  62* 25*  CREATININE  --  9.89* 5.69*  CALCIUM  --  8.4* 8.2*  PHOS 7.9*  --   --    Liver Function Tests  Recent Labs  02/15/16 0708 02/17/16 0154  AST 20 170*  ALT 17 47  ALKPHOS 68 50  BILITOT 0.7 0.5  PROT 7.6 5.9*  ALBUMIN 3.5 2.6*   Cardiac Enzymes  Recent Labs  02/15/16 1100 02/15/16 1630 02/15/16 2220  TROPONINI 14.20* >65.00* >65.00*   Fasting Lipid Panel  Recent Labs  02/15/16 0708  CHOL 105  HDL 27*  LDLCALC 47  TRIG 156*  CHOLHDL 3.9    TELE NSR    ECG  NSR with bifascicular block  Echocardiogram 02/15/2016  LV EF: 55% - 60%  ------------------------------------------------------------------- Indications: Chest pain 786.51.  ------------------------------------------------------------------- History: PMH: CABG, PAF, SVT. Coronary artery  disease.  ------------------------------------------------------------------- Study Conclusions  - Left ventricle: The cavity size was normal. Systolic function was  normal. The estimated ejection fraction was in the range of 55%  to 60%. There is hypokinesis of the basal-midanteroseptal  myocardium. There is hypokinesis of the apicalanterior  myocardium. - Aortic valve: Severely calcified annulus. Trileaflet. Mild  diffuse thickening and calcification. There was very mild  stenosis. There was mild regurgitation. Valve area (VTI): 1.51  cm^2. Valve area (Vmax): 1.45 cm^2. Valve area (Vmean): 1.33  cm^2. - Mitral valve: Severely calcified annulus. Severe diffuse  thickening and calcification of the posterior leaflet, with  moderate involvement of chords. Mild diffuse calcification of the  anterior leaflet, with moderate involvement of chords. Mobility  of the posterior leaflet was restricted to the point of  immobility. There was mild regurgitation. - Left atrium: The atrium was moderately dilated. - Pulmonary arteries: PA peak pressure: 51 mm Hg (S).  Impressions:  - The right ventricular systolic pressure was increased consistent  with moderate pulmonary hypertension.    Radiology/Studies  Dg Chest Portable 1 View  02/15/2016  CLINICAL DATA:  Chest pain, post cardiac stent placement EXAM: PORTABLE CHEST 1 VIEW COMPARISON:  05/20/2015 and 02/21/2015 FINDINGS: Borderline cardiomegaly. Status post median sternotomy. No acute infiltrate or pulmonary edema. Stable right perihilar scarring. A broken sternotomy wire is noted in right apical chest wall. No pneumothorax. IMPRESSION: No active disease. Status post median sternotomy. Stable right perihilar scarring. Electronically Signed   By: Lahoma Crocker M.D.   On: 02/15/2016 11:50    ASSESSMENT AND PLAN  60 yo male with PMH of PAF on coumadin, CAD s/p 2V CABG, ESRD with h/o rejected transplant current being evaluated  for repeat transplant who stopped his plavix a week ago pending renal eval presented with anterior STEMI  1. Anterior STEMI  - significant myocardial injury with post cath trop > 65 and new prominent bifascicular block post cath  - cath 02/15/2016 100% distal SVG to LAD stenosis treated with 2.5x 14m Promus DES. Recommend DAPT for at least 1 year and ideally indefinitely given multiple stents.   - per cath report, "patient can ultimately be treated with Plavix and warfarin without aspirin after at least 1-2 weeks of triple therapy."  - echo 02/15/2016 EF 55-60%, hypokinesis of basal-midanteroseptal myocardium, mild AR/AS, severe diffuse thickening of mitral valve with immobile posterior leaflet, PA peak pressure  31mHg  - holding coumadin given hematoma, Hb 7.8 today with desaturation at night, we will give 2 units of PRBC and order home O 2 evaluation  2. Large R femoral hematoma post cath  - in the setting of supratherapeutic INR, INR went up to 4.62 yesterday, 1.98 today, worsening anemia, we will oredr 2 units of PRBC, coumadin on hold for now  3. CAD s/p 2v CABG 2014 (Y SVG to LAD and D2) with tissue AVR  - NSTEMI 01/2015- [facility]2015 Myoview was high risk. LHC demonstrated patent bypass graft and mid LCx ISR of 65% that was not hemodynamically significant by FFR. Treated medically.  4. ESRD on HD MWF (previous s/p renal transplant with subsequent rejection  - pending evaluation for repeat renal transplant, we will need to decide when he can stop taking plavix, ideally at least a year, however he is concerned that this time delay would exclude him from a kidney transplant (currently on the list). - our concern is that he developed STEMI only a week after discontinuation or plavix, the plan would be to evaluate him in 6 months and decide if it is safe to stop plavix.   - HD tomorrow  5. PAF on coumadin  6. OSA   KEna Dawley6/13/2017

## 2016-02-17 NOTE — Progress Notes (Signed)
CARDIAC REHAB PHASE I   PRE:  Rate/Rhythm: 82 SR  BP:  Sitting: 96/67        SaO2: 95 2L  MODE:  Ambulation: 190 ft   POST:  Rate/Rhythm: 102 ST  BP:  Sitting: 110/67         SaO2: 93 2L  Pt in bed, agreeable to walk. Pt c/o mild dizziness upon standing, improved with distance. Pt ambulated 190 ft on 2L O2, rolling Hansman, gait belt, assist x2, mildly unsteady gait, tolerated fairly well, no real complaints other than tightness in his leg. Needed cues to stay within [place]. VSS. Pt to recliner after walk, feet elevated, call bell within reach. Will follow as x1.    [phone] Lenna Sciara, RN, BSN 02/17/2016 3:14 PM

## 2016-02-18 ENCOUNTER — Encounter (HOSPITAL_COMMUNITY): Payer: Self-pay | Admitting: Physician Assistant

## 2016-02-18 ENCOUNTER — Inpatient Hospital Stay (HOSPITAL_COMMUNITY): Payer: Medicare Other

## 2016-02-18 DIAGNOSIS — S301XXA Contusion of abdominal wall, initial encounter: Secondary | ICD-10-CM

## 2016-02-18 DIAGNOSIS — I213 ST elevation (STEMI) myocardial infarction of unspecified site: Secondary | ICD-10-CM

## 2016-02-18 LAB — HEMOGLOBIN AND HEMATOCRIT, BLOOD
HCT: 21 % — ABNORMAL LOW (ref 39.0–52.0)
HEMOGLOBIN: 7 g/dL — AB (ref 13.0–17.0)

## 2016-02-18 LAB — RENAL FUNCTION PANEL
ALBUMIN: 2.8 g/dL — AB (ref 3.5–5.0)
ANION GAP: 12 (ref 5–15)
BUN: 50 mg/dL — ABNORMAL HIGH (ref 6–20)
CHLORIDE: 98 mmol/L — AB (ref 101–111)
CO2: 25 mmol/L (ref 22–32)
Calcium: 9 mg/dL (ref 8.9–10.3)
Creatinine, Ser: 8.99 mg/dL — ABNORMAL HIGH (ref 0.61–1.24)
GFR calc Af Amer: 7 mL/min — ABNORMAL LOW (ref >60)
GFR, EST NON AFRICAN AMERICAN: 6 mL/min — AB (ref >60)
GLUCOSE: 101 mg/dL — AB (ref 65–99)
PHOSPHORUS: 7.1 mg/dL — AB (ref 2.5–4.6)
POTASSIUM: 4.6 mmol/L (ref 3.5–5.1)
Sodium: 135 mmol/L (ref 135–145)

## 2016-02-18 LAB — CBC
HEMATOCRIT: 25 % — AB (ref 39.0–52.0)
HEMOGLOBIN: 7.9 g/dL — AB (ref 13.0–17.0)
MCH: 30.7 pg (ref 26.0–34.0)
MCHC: 31.6 g/dL (ref 30.0–36.0)
MCV: 97.3 fL (ref 78.0–100.0)
Platelets: 166 10*3/uL (ref 150–400)
RBC: 2.57 MIL/uL — ABNORMAL LOW (ref 4.22–5.81)
RDW: 15.4 % (ref 11.5–15.5)
WBC: 7.8 10*3/uL (ref 4.0–10.5)

## 2016-02-18 LAB — PREPARE RBC (CROSSMATCH)

## 2016-02-18 LAB — PROTIME-INR
INR: 1.37 (ref 0.00–1.49)
Prothrombin Time: 17 seconds — ABNORMAL HIGH (ref 11.6–15.2)

## 2016-02-18 MED ORDER — SODIUM CHLORIDE 0.9 % IV SOLN: 100.0000 mL | INTRAVENOUS | Status: DC | PRN

## 2016-02-18 MED ORDER — LIDOCAINE HCL (PF) 1 % IJ SOLN: 5.0000 mL | INTRAMUSCULAR | Status: DC | PRN

## 2016-02-18 MED ORDER — HEPARIN SODIUM (PORCINE) 1000 UNIT/ML DIALYSIS: 1000.0000 [IU] | INTRAMUSCULAR | Status: DC | PRN

## 2016-02-18 MED ORDER — ALTEPLASE 2 MG IJ SOLR: 2.0000 mg | Freq: Once | INTRAMUSCULAR | Status: DC | PRN

## 2016-02-18 MED ORDER — LIDOCAINE-PRILOCAINE 2.5-2.5 % EX CREA: 1.0000 "application " | TOPICAL_CREAM | CUTANEOUS | Status: DC | PRN

## 2016-02-18 MED ORDER — PENTAFLUOROPROP-TETRAFLUOROETH EX AERO: 1.0000 "application " | INHALATION_SPRAY | CUTANEOUS | Status: DC | PRN

## 2016-02-18 MED ORDER — SODIUM CHLORIDE 0.9 % IV SOLN: Freq: Once | INTRAVENOUS | Status: DC

## 2016-02-18 NOTE — Progress Notes (Signed)
[facility] Progress Note   Subjective: no c/o  Filed Vitals:   02/18/16 0930 02/18/16 1000 02/18/16 1030 02/18/16 1100  BP: 96/66 94/67 104/70 96/71  Pulse: 74 76 76 79  Temp:      TempSrc:      Resp: '20 15 14 19  '$ Height:      Weight:      SpO2: 97% 99% 98% 100%    Inpatient medications: . sodium chloride   Intravenous Once  . aspirin EC  81 mg Oral Daily  . atorvastatin  40 mg Oral QHS  . calcium acetate  4,002 mg Oral TID WC  . citalopram  20 mg Oral Daily  . clopidogrel  75 mg Oral Q breakfast  . docusate sodium  200 mg Oral BID  . famotidine  20 mg Oral Daily  . folic acid  1 mg Oral Daily  . gabapentin  300 mg Oral QHS  . metoprolol tartrate  25 mg Oral BID  . multivitamin  1 tablet Oral Daily  . sodium chloride flush  3 mL Intravenous Q12H  . sodium chloride flush  3 mL Intravenous Q12H  . sodium chloride flush  3 mL Intravenous Q12H  . sodium polystyrene  15 g Oral Q T,Th,Sat-1800   . sodium chloride Stopped (02/16/16 0900)   sodium chloride, sodium chloride, sodium chloride, sodium chloride, sodium chloride, acetaminophen, albuterol, ALPRAZolam, alteplase, heparin, lidocaine (PF), lidocaine-prilocaine, morphine injection, nitroGLYCERIN, ondansetron (ZOFRAN) IV, pentafluoroprop-tetrafluoroeth, sodium chloride flush, sodium chloride flush, sodium chloride flush, zolpidem  Exam: Alert, no distress NECK:No JVD LUNGS:Clear CARDIAC:RRR 1/6 systolic M ABD:+ BS NTND No HSM Kidney palpable RLQ R groin hematoma substantial size, no chg EXT:tr edema. AVF Lt arm + bruit NEURO:CNI M&SI Ox3 no asterixis  CXR 6/11 no active disease  Dialysis: MWF [facility]   4h  83kg  1K/ 2.25 bath  P2  Hep 2000   AVF L arm   Assessment: 1. CP SP PCI/DES to LAD SVG 2. R groin hematoma  3. Anemia d/t ABL, sp prbc's 4. ESRD 5. HTN 6. Vol up 2-3kg  Plan - HD today, UF to dry wt, no hep   Kelly Splinter MD [facility] pager [phone]    cell  [phone] 02/18/2016, 11:22 AM    Recent Labs Lab 02/15/16 1630 02/16/16 0315 02/17/16 0154 02/18/16 0840  NA  --  135 135 135  K  --  5.4* 4.2 4.6  CL  --  98* 97* 98*  CO2  --  '24 29 25  '$ GLUCOSE  --  84 74 101*  BUN  --  62* 25* 50*  CREATININE  --  9.89* 5.69* 8.99*  CALCIUM  --  8.4* 8.2* 9.0  PHOS 7.9*  --   --  7.1*    Recent Labs Lab 02/15/16 0708 02/17/16 0154 02/18/16 0840  AST 20 170*  --   ALT 17 47  --   ALKPHOS 68 50  --   BILITOT 0.7 0.5  --   PROT 7.6 5.9*  --   ALBUMIN 3.5 2.6* 2.8*    Recent Labs Lab 02/15/16 0708  02/16/16 1455 02/17/16 0154 02/18/16 0840  WBC 8.9  < > 7.1 9.0 7.8  NEUTROABS 7.2  --   --  7.1  --   HGB 11.7*  < > 9.1* 8.5* 7.9*  HCT 37.3*  < > 28.7* 27.0* 25.0*  MCV 99.7  < > 98.6 98.9 97.3  PLT 165  < >  159 145* 166  < > = values in this interval not displayed. Iron/TIBC/Ferritin/ %Sat No results found for: IRON, TIBC, FERRITIN, IRONPCTSAT

## 2016-02-18 NOTE — Progress Notes (Signed)
Hemoglobin continues to fall, even after dialysis. 7.9 this AM->7.0 this afternoon. (Admit Hgb 11.7.) Right groin appears with large firm hematoma as before, still within limits of prior circumferential outline but patient does note that down towards his thigh it looks like it's closer to the line than before. No active bleeding from the cath site itself. D/w Dr. Meda Coffee. Will proceed with CT abd pelvis wo contrast to rule out retroperitoneal hemorrhage as well as groin duplex to exclude pseudoaneurysm or fistula formuation, and transfuse 2 U PRBC out of caution given brisk drop in blood count and underlying cardiac disease. I discussed this with the patient and his wife who are agreeable to the plan and expressed understanding of why he will not be discharged today. Will not yet resume Coumadin.  If results of above studies are not yet back by the end of my shift I will sign out to [facility] PA-C to look out for the results, who is on call til 8pm. Melina Copa PA-C

## 2016-02-18 NOTE — Progress Notes (Signed)
Patient: [name] / Admit Date: 02/15/2016 / Date of Encounter: 02/18/2016, 9:03 AM   Subjective: He feels well, no chest pain or SOB.  Objective: Physical Exam: Blood pressure 114/71, pulse 73, temperature 97.9 F (36.6 C), temperature source Oral, resp. rate 18, height '5\' 10"'$  (1.778 m), weight 188 lb 11.2 oz (85.594 kg), SpO2 96 %. General: Well developed, well nourished, in no acute distress. Head: Normocephalic, atraumatic, sclera non-icteric, no xanthomas, nares are without discharge. Neck: Negative for carotid bruits. JVP not elevated. Lungs: Clear bilaterally to auscultation without wheezes, rales, or rhonchi. Breathing is unlabored. Heart: RRR S1 S2 without murmurs, rubs, or gallops.  Abdomen: Soft, non-tender, non-distended with normoactive bowel sounds. No rebound/guarding. Extremities: No clubbing or cyanosis. No edema. Distal pedal pulses are 2+ and equal bilaterally. Neuro: Alert and oriented X 3. Moves all extremities spontaneously. Psych:  Responds to questions appropriately with a normal affect.   Intake/Output Summary (Last 24 hours) at 02/18/16 0903 Last data filed at 02/18/16 8413  Gross per 24 hour  Intake    755 ml  Output      0 ml  Net    755 ml    Inpatient Medications:  . sodium chloride   Intravenous Once  . aspirin EC  81 mg Oral Daily  . atorvastatin  40 mg Oral QHS  . calcium acetate  4,002 mg Oral TID WC  . citalopram  20 mg Oral Daily  . clopidogrel  75 mg Oral Q breakfast  . docusate sodium  200 mg Oral BID  . famotidine  20 mg Oral Daily  . folic acid  1 mg Oral Daily  . gabapentin  300 mg Oral QHS  . metoprolol tartrate  25 mg Oral BID  . multivitamin  1 tablet Oral Daily  . sodium chloride flush  3 mL Intravenous Q12H  . sodium chloride flush  3 mL Intravenous Q12H  . sodium chloride flush  3 mL Intravenous Q12H  . sodium polystyrene  15 g Oral Q T,Th,Sat-1800   Infusions:  . sodium chloride Stopped (02/16/16 0900)     Labs:  Recent Labs  02/15/16 1630 02/16/16 0315 02/17/16 0154  NA  --  135 135  K  --  5.4* 4.2  CL  --  98* 97*  CO2  --  24 29  GLUCOSE  --  84 74  BUN  --  62* 25*  CREATININE  --  9.89* 5.69*  CALCIUM  --  8.4* 8.2*  PHOS 7.9*  --   --     Recent Labs  02/17/16 0154  AST 170*  ALT 47  ALKPHOS 50  BILITOT 0.5  PROT 5.9*  ALBUMIN 2.6*    Recent Labs  02/17/16 0154 02/18/16 0840  WBC 9.0 7.8  NEUTROABS 7.1  --   HGB 8.5* 7.9*  HCT 27.0* 25.0*  MCV 98.9 97.3  PLT 145* 166    Recent Labs  02/15/16 1100 02/15/16 1630 02/15/16 2220  TROPONINI 14.20* >65.00* >65.00*   Invalid input(s): POCBNP No results for input(s): HGBA1C in the last 72 hours.   Radiology/Studies:  Dg Chest Portable 1 View  02/15/2016  CLINICAL DATA:  Chest pain, post cardiac stent placement EXAM: PORTABLE CHEST 1 VIEW COMPARISON:  05/20/2015 and 02/21/2015 FINDINGS: Borderline cardiomegaly. Status post median sternotomy. No acute infiltrate or pulmonary edema. Stable right perihilar scarring. A broken sternotomy wire is noted in right apical chest wall. No pneumothorax. IMPRESSION: No active disease. Status post median  sternotomy. Stable right perihilar scarring. Electronically Signed   By: Lahoma Crocker M.D.   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 Recent nuc 11/2015 in prep for repeat transplant eval was ow risk at [facility]. In June 2017 he was told he could stop Plavix but  remained on ASA + Coumadin. He was admitted 02/16/16 with anterior STEMI. LHC 02/15/16: acute occlusion of SVG-LAD at anastamosis, patent LCx with moderate restenosis, moderate RCA disease, patent Y graft to diagonal with moderate ostial stenosis, s/p angioplasty/DES to SVG-LAD extending into the native vessel - difficult procedure c/b hematoma. 2D 02/15/16: EF 55-60%, HK basal mid ateroseptal myocardium and HK of apical anterior myocardium, calcified AV with mild AS, mild AI, calcified MV with mobility restriction of posterior leaflet with mild MR, mod dilated LAE, PASP 43mHg. Post-hospital course notable for NSVT, prominent bifascicular block on EKG post-cath, ABL anemia, nocturnal hypoxia, hypotension.  1. Anterior STEMI/CAD - initial plan was to treat with Plavix and Warfarin without ASA after 1-2 weeks of triple therapy but patient developed #2, requiring holding of Coumadin. Continue ASA, Plavix, atorvastatin, and BB, following HR on telemetry given bifasicular block.  2. Large right femoral hematoma post-cath with acute blood loss anemia - Hgb down to 7.9. I see order for 1 U PRBC administered yesterday. Will discuss repeat transfusion with MD.  3. ESRD on HD (previous s/p renal transplant with subsequent rejection) - pending evaluation for repeat renal transplant, we will need to decide when he can stop taking plavix, ideally at least a year, however he is concerned that this time delay would exclude him from a kidney transplant (currently on the list). Our concern is that he developed STEMI only a week after discontinuation or plavix. The plan would be to evaluate him in 6 months and decide if it is safe to stop. Renal following.  4. PAF - will need to follow Hgb for stability before resuming Coumadin.  5. Nocturnal hypoxia - will need outpatient overnight oximetry.  Signed, DMelina CopaPA-C Pager: [phone] The patient was seen, examined and discussed with DMelina Copa PA-C and I agree with the  above.   60year old male with ESRD on HD on kidney transplant waiting listsignificant myocardial injury with post cath trop > 65 and new prominent bifascicular block post cath, cath 02/15/2016 100% distal SVG to LAD stenosis treated with 2.5x 227mPromus DES. Recommend DAPT for at least 1 year and ideally indefinitely given multiple stents. Per cath report, "patient can ultimately be treated with Plavix and warfarin without aspirin after at least 1-2 weeks of triple therapy.  He developed a large right groin hematoma post cath, Hb 7.9 post 1 unit of PRBC yesterday, HD today, still off coumadin. He is insisting on going home, I would recheck his H post HB and if stable start coumadin and send home with very close follow up in our clinic, ideally this Friday with repeat CBC.  We will discontinue ASA on 6/24.  Long term regarding his kidney transplant, we will need to decide when he can stop taking plavix, ideally at least a year, however he is concerned that this time delay would exclude him from a kidney transplant (currently on the list). Our concern is that he developed STEMI only a week after discontinuation or plavix, the plan would be to evaluate him in 6 months and decide if it is safe to stop plavix.   Ena Dawley 02/18/2016

## 2016-02-18 NOTE — Progress Notes (Signed)
CARDIAC REHAB PHASE I   PRE:  Rate/Rhythm: 81  SR  BP:  Sitting: 103/73        SaO2: 91 RA  MODE:  Ambulation: 350 ft   POST:  Rate/Rhythm: 78 SR  BP:  Sitting: 99/73         SaO2: 94 RA  Pt ambulated 350 ft on RA, rolling Guerrier, handheld assist, fairly steady steady gait, tolerated well, no complaints, brief standing rest x1. Sats good on RA. Pt states he has oxygen at home for use at night as needed. Completed MI/stent education. Reviewed anti-platelet therapy, stent card, activity restrictions, ntg, exercise, sodium restrictions (pt follows renal diet-will defer to renal diet), and phase 2 cardiac rehab. Pt verbalized understanding. Pt agrees to phase 2 cardiac rehab referral, will send to [place] per pt request. Pt to bed per pt request after walk, call bell within reach.   Cobb, RN, BSN 02/18/2016 1:48 PM

## 2016-02-18 NOTE — Progress Notes (Signed)
VASCULAR LAB PRELIMINARY  PRELIMINARY  PRELIMINARY  PRELIMINARY  Right groin evaluation for pseudoaneurysm completed.    Preliminary report:  No evidence of right groin pseudoaneurysm. Enlargement of the inguinal lymph node noted as well as a hematoma and edema.  Janifer Adie, RVT,RDMS 02/18/2016, 5:20 PM

## 2016-02-19 ENCOUNTER — Encounter (HOSPITAL_COMMUNITY): Payer: Self-pay | Admitting: Physician Assistant

## 2016-02-19 ENCOUNTER — Telehealth: Payer: Self-pay | Admitting: Physician Assistant

## 2016-02-19 ENCOUNTER — Other Ambulatory Visit: Payer: Self-pay | Admitting: Physician Assistant

## 2016-02-19 DIAGNOSIS — I4729 Other ventricular tachycardia: Secondary | ICD-10-CM

## 2016-02-19 DIAGNOSIS — R0902 Hypoxemia: Secondary | ICD-10-CM | POA: Diagnosis present

## 2016-02-19 DIAGNOSIS — I472 Ventricular tachycardia: Secondary | ICD-10-CM

## 2016-02-19 DIAGNOSIS — I452 Bifascicular block: Secondary | ICD-10-CM | POA: Diagnosis present

## 2016-02-19 DIAGNOSIS — S301XXA Contusion of abdominal wall, initial encounter: Secondary | ICD-10-CM

## 2016-02-19 DIAGNOSIS — D62 Acute posthemorrhagic anemia: Secondary | ICD-10-CM

## 2016-02-19 DIAGNOSIS — S301XXD Contusion of abdominal wall, subsequent encounter: Secondary | ICD-10-CM

## 2016-02-19 LAB — CBC
HEMATOCRIT: 32.4 % — AB (ref 39.0–52.0)
Hemoglobin: 10.3 g/dL — ABNORMAL LOW (ref 13.0–17.0)
MCH: 30.6 pg (ref 26.0–34.0)
MCHC: 31.8 g/dL (ref 30.0–36.0)
MCV: 96.1 fL (ref 78.0–100.0)
Platelets: 167 10*3/uL (ref 150–400)
RBC: 3.37 MIL/uL — AB (ref 4.22–5.81)
RDW: 16.9 % — ABNORMAL HIGH (ref 11.5–15.5)
WBC: 7.6 10*3/uL (ref 4.0–10.5)

## 2016-02-19 LAB — BASIC METABOLIC PANEL
ANION GAP: 9 (ref 5–15)
BUN: 37 mg/dL — ABNORMAL HIGH (ref 6–20)
CO2: 30 mmol/L (ref 22–32)
Calcium: 9.3 mg/dL (ref 8.9–10.3)
Chloride: 98 mmol/L — ABNORMAL LOW (ref 101–111)
Creatinine, Ser: 6.89 mg/dL — ABNORMAL HIGH (ref 0.61–1.24)
GFR, EST AFRICAN AMERICAN: 9 mL/min — AB (ref >60)
GFR, EST NON AFRICAN AMERICAN: 8 mL/min — AB (ref >60)
GLUCOSE: 94 mg/dL (ref 65–99)
POTASSIUM: 5 mmol/L (ref 3.5–5.1)
Sodium: 137 mmol/L (ref 135–145)

## 2016-02-19 LAB — PROTIME-INR
INR: 1.25 (ref 0.00–1.49)
Prothrombin Time: 15.9 seconds — ABNORMAL HIGH (ref 11.6–15.2)

## 2016-02-19 MED ORDER — ATORVASTATIN CALCIUM 40 MG PO TABS: 40.0000 mg | ORAL_TABLET | Freq: Every evening | ORAL | Status: DC

## 2016-02-19 MED ORDER — CLOPIDOGREL BISULFATE 75 MG PO TABS: 75.0000 mg | ORAL_TABLET | Freq: Every day | ORAL | Status: DC

## 2016-02-19 NOTE — Discharge Summary (Signed)
Discharge Summary    Patient ID: [name],  MRN: [id number], DOB/AGE: Sep 11, 1955 60 y.o.  Admit date: 02/15/2016 Discharge date: 02/19/2016  Primary Care Provider: [place] Primary Cardiologist: [facility]  Discharge Diagnoses    Principal Problem:   Acute ST elevation myocardial infarction (STEMI) [facility]) Active Problems:   Hypotension   Renal transplant failure and rejection   Multiple sclerosis ([place])   OSA -not compliant with C-pap   PAF (paroxysmal atrial fibrillation) ([place])   ESRD on hemodialysis   CAD of artery bypass graft-occluded SVG-LAD 02/15/16   Essential (primary) hypertension   CAD- s/p CABG in Aug 2014 with Y SVG to LAD & D2; NSTEMI 01/2015 [facility] s/p DES to LCx and BMS to SVG-LAD, NSTEMI 05/2015 non-flow limiting FFR of Cx, STEMI 02/2016 s/p angioplasty/DES to SVG-LAD   Paroxysmal atrial flutter (H[facility])   Chronic anticoagulation-Coumadin   History of tissue AVR 2014   Hx of CABG x 2 2014   Acute blood loss anemia   Groin hematoma   NSVT (nonsustained ventricular tachycardia) (H[facility])   Bifascicular block   Nocturnal hypoxemia    Diagnostic Studies/Procedures    Cath and echo this admission, see below _____________   History of Present Illness & Hospital Course    Mr. 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 Recent nuc 11/2015 in prep for repeat transplant eval was low risk at [facility]. In June 2017 he was cleared to stop Plavix but remained on  ASA + Coumadin - per notes, the transplant team wanted him to come off Plavix.    He was admitted 02/16/16 with midsternal chest pain with radiation to his jaw an teeth. EKG showed anterior STEMI. LHC 02/15/16: acute occlusion of SVG-LAD at anastamosis, patent LCx with moderate restenosis, moderate RCA disease, patent Y graft to diagonal with moderate ostial stenosis, s/p angioplasty/DES to SVG-LAD extending into the native vessel - this was a difficult procedure c/b large right groin hematoma. 2D 02/15/16: EF 55-60%, HK basal mid ateroseptal myocardium and HK of apical anterior myocardium, calcified AV with mild AS, mild AI, calcified MV with mobility restriction of posterior leaflet with mild MR, mod dilated LAE, PASP 50mHg. His post-hospital course notable for NSVT, prominent bifascicular block on EKG post-cath, ABL anemia, nocturnal hypoxia, and episodic hypotension. Coumadin was held for several days and actually reversed due to supratherapeutic INR and progressive anemia. Renal followed for provision of HD. Hgb was 11.7 on admission and slowly drifted down - he received 1 U PRBC on 6/13 for Hgb of 8.5 but f/u Hgb yesterday AM was 7.9. He was feeling better with possible plans for discharge but repeat Hgb in the afternoon was 7.0 thus he stayed for additional monitoring and imaging. CT abd pelvis ruled out retroperitoneal hemorrhage. Groin [place] without pseudoaneurysm. He received an additional 2 U PRBC with subsequent improvement in Hgb to 10.3. Atorvastatin was increased from 1/2 tablet daily (wife was giving actually every other day due to prior myalgias) to full  tablet daily. He has tolerated in hospital and will see how things go at home and will let us know. He is feeling well today. Regarding his nocturnal hypoxia he has home O2 that he is supposed to wear QHS already at home. Dr. Meda Coffee has seen and examined the patient today and feels he is stable for discharge. She has recommended to resume  Coumadin at home dose without bridge. He was advised to monitor for worsening bleeding.  With regard to follow-up, - The plan is for triple therapy initially and stopping aspirin 1-2 weeks after PCI, which Dr. Meda Coffee calculated 2 weeks at 02/28/16. However, if his Hgb has fallen again in follow-up on Monday 6/19, we would consider stopping this earlier. He will come in for INR check and CBC on Monday. - He will have TOC visit in 1 week. - Long term decisions will need to be made in 6 months to decide if it is safe to stop Plavix at some point - the patient is concerned that this time delay would exclude him from a kidney transplant (currently on the list), but our concern is that he developed STEMI only a week after discontinuation of Plavix.   Consultants: N/A _____________  Discharge Vitals Blood pressure 118/70, pulse 66, temperature 98.1 F (36.7 C), temperature source Oral, resp. rate 16, height '5\' 10"'$  (1.778 m), weight 185 lb 1.6 oz (83.961 kg), SpO2 96 %.  Filed Weights   02/18/16 0800 02/18/16 1145 02/19/16 0500  Weight: 189 lb 9.5 oz (86 kg) 182 lb 15.7 oz (83 kg) 185 lb 1.6 oz (83.961 kg)    Labs & Radiologic Studies    CBC  Recent Labs  02/17/16 0154 02/18/16 0840 02/18/16 1356 02/19/16 0431  WBC 9.0 7.8  --  7.6  NEUTROABS 7.1  --   --   --   HGB 8.5* 7.9* 7.0* 10.3*  HCT 27.0* 25.0* 21.0* 32.4*  MCV 98.9 97.3  --  96.1  PLT 145* 166  --  626   Basic Metabolic Panel  Recent Labs  02/18/16 0840 02/19/16 0431  NA 135 137  K 4.6 5.0  CL 98* 98*  CO2 25 30  GLUCOSE 101* 94  BUN 50* 37*  CREATININE 8.99* 6.89*  CALCIUM 9.0 9.3  PHOS 7.1*  --    Liver Function Tests  Recent Labs  02/17/16 0154 02/18/16 0840  AST 170*  --   ALT 47  --   ALKPHOS 50  --   BILITOT 0.5  --   PROT 5.9*  --   ALBUMIN 2.6* 2.8*   _____________  Ct Abdomen Pelvis Wo Contrast  02/18/2016  CLINICAL DATA:  Anemia.  Evaluate for retroperitoneal hematoma. EXAM: CT ABDOMEN AND  PELVIS WITHOUT CONTRAST TECHNIQUE: Multidetector CT imaging of the abdomen and pelvis was performed following the standard protocol without IV contrast. COMPARISON:  02/12/2012 FINDINGS: Lower chest and abdominal wall: 65 x 53 x 36 mm hematoma superficial to the right common femoral artery, known catheterization site hematoma. There is more ill-defined hemorrhage tracking into the right thigh. No proximal continuation into the retroperitoneum. Hematoma is not in direct continuity with the vessel. There is planned Doppler assessment to exclude pseudoaneurysm or fistula. Stable broad necked shallow lower abdominal hernia which contains bowel and the bladder apex. There is extensive scarring of the abdominal wall. There is a discrete right flank hernia which is wide necked and contains ascending colon and cecum. Mild basilar atelectasis or scarring. Coronary atherosclerosis. Prominent mitral  annular calcification for age, likely accelerated by end-stage renal disease. Anasarca. Hepatobiliary: No focal liver abnormality.No evidence of biliary obstruction or stone. Pancreas: Unremarkable. Spleen: Unremarkable. Adrenals/Urinary Tract: Negative adrenals. 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 No hydronephrosis. There is a transplant kidney in the right lower quadrant which is now atrophic and presumably failed. Collapsed bladder, again with apex directed towards a lower abdominal hernia. Reproductive:No pathologic findings. Stomach/Bowel:  No obstruction. No inflammation. Vascular/Lymphatic: Extensive atherosclerotic calcification of the aorta and branch vessels. No acute vascular abnormality. No mass or adenopathy. Peritoneal: No ascites or pneumoperitoneum. Musculoskeletal: Endplate and subchondral facet erosive changes, also present around the sacroiliac joints. The bones are osteopenic with coarsened trabeculation. 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 IMPRESSION: 1. Known right groin  hematoma, up to 65 mm in diameter, tracking into the thigh. No retroperitoneal component. 2. Chronic findings are described above. Electronically Signed   By: Monte Fantasia M.D.   On: 02/18/2016 16:51   Dg Chest Portable 1 View  02/15/2016  CLINICAL DATA:  Chest pain, post cardiac stent placement EXAM: PORTABLE CHEST 1 VIEW COMPARISON:  05/20/2015 and 02/21/2015 FINDINGS: Borderline cardiomegaly. Status post median sternotomy. No acute infiltrate or pulmonary edema. Stable right perihilar scarring. A broken sternotomy wire is noted in right apical chest wall. No pneumothorax. IMPRESSION: No active disease. Status post median sternotomy. Stable right perihilar scarring. Electronically Signed   By: Lahoma Crocker M.D.   On: 02/15/2016 11:50   Disposition   Pt is being discharged home today in good condition.  Follow-up Plans & Appointments    Follow-up Information    Follow up with [facility].   Specialty:  Cardiology   Why:  [facility] - Monday 02/23/16 - Come in at 11:30am for labwork to check hemoglobin and Coumadin Clinic appointment at 12   Contact information:   [address], [place] [phone]      Follow up with Charlie Pitter, PA-C.   Specialties:  Cardiology, Radiology   Why:  [facility] - 02/26/16 at 2:30pm. Lisbeth Renshaw is one of the PAs with our group.   Contact information:   [address] [place] [address] [phone]      Discharge Instructions    AMB Referral to Cardiac Rehabilitation - Phase II    Complete by:  As directed   Diagnosis:   STEMI PTCA       AMB Referral to Cardiac Rehabilitation - Phase II    Complete by:  As directed   Diagnosis:   Coronary Stents STEMI       Amb Referral to Cardiac Rehabilitation    Complete by:  As directed   To [place]  Diagnosis:   STEMI Coronary Stents       Diet - low sodium heart healthy    Complete by:  As directed       Increase activity slowly    Complete by:  As directed   No driving for 2 weeks. No lifting over 10 lbs for 4 weeks. No sexual activity for 4 weeks. You may not return to work until cleared by your cardiologist if applicable. Keep procedure site clean & dry. If you notice increased pain, swelling, bleeding or pus, call/return!  You may shower, but no soaking baths/hot tubs/pools for 1 week.   We will recheck your blood counts on Monday. Based on this level and how you are doing at your follow-up appointment,  we will plan to stop aspirin soon. (The projected stop date is 02/28/16, but we will discuss the exact timing at your follow-up on 02/26/16.)  Your clopidogrel/Plavix has been restarted. Your atorvastatin was increased. If you develop muscle aches again please call our office.  If you notice any bleeding such as blood in stool, black tarry stools, blood in urine, nosebleeds or any other unusual bleeding, call your doctor immediately.           Discharge Medications   Current Discharge Medication List    CONTINUE these medications which have CHANGED   Details  atorvastatin (LIPITOR) 40 MG tablet Take 1 tablet (40 mg total) by mouth every evening. Qty: 30 tablet, Refills: 6    clopidogrel (PLAVIX) 75 MG tablet Take 1 tablet (75 mg total) by mouth daily. Qty: 90 tablet, Refills: 3      CONTINUE these medications which have NOT CHANGED   Details  acetaminophen (TYLENOL) 500 MG tablet Take 1,000 mg by mouth every 6 (six) hours as needed for pain or fever.    albuterol (PROAIR HFA) 108 (90 BASE) MCG/ACT inhaler Inhale 1-2 puffs into the lungs every 6 (six) hours as needed for shortness of breath.     aspirin EC 81 MG tablet Take 1 tablet (81 mg total) by mouth daily.    calcium acetate (PHOSLO) 667 MG capsule Take 4,002 mg by mouth 3 (three) times daily with meals.     citalopram (CELEXA) 20 MG tablet Take 20 mg by mouth daily.    docusate sodium (COLACE) 100 MG capsule Take 300 mg  by mouth 2 (two) times daily.    folic acid (FOLVITE) 1 MG tablet Take 1 mg by mouth daily.    gabapentin (NEURONTIN) 300 MG capsule Take 300 mg by mouth at bedtime.     metoprolol tartrate (LOPRESSOR) 25 MG tablet Take 25 mg by mouth 2 (two) times daily.     multivitamin (RENA-VIT) TABS tablet Take 1 tablet by mouth daily.     nitroGLYCERIN (NITROSTAT) 0.4 MG SL tablet Place 0.4 mg under the tongue every 5 (five) minutes as needed for chest pain (Take one tabe every 5-10 minutes for chest pain. Once you have taken three tabs you need to notify your MD or go to ER if chest pain not gone.).    ranitidine (ZANTAC) 150 MG tablet Take 150 mg by mouth daily.    sodium polystyrene (KAYEXALATE) 15 GM/60ML suspension Take 15 g by mouth See admin instructions. Only take 15 g on Sun / Tues / Thurs / Sat (non dialysis days)    warfarin (COUMADIN) 7.5 MG tablet Take 1/2-1 tablet by mouth daily as directed by coumadin clinic. Continue taking 1 tablet daily except 1/2 tablet on Tuesdays and Saturdays.          Allergies:  Allergies  Allergen Reactions  . Diphenhydramine Itching    Only with IV doses. Tolerates oral.  . Penicillins Other (See Comments)    migraine  . Adhesive [Tape] Rash    Please use paper tape  . Amoxicillin Rash    migraine  . Other Rash    Please use paper tape  . Penicillin G Rash    headache    Aspirin prescribed at discharge?  Yes High Intensity Statin Prescribed? (Lipitor 40-'80mg'$  or Crestor 20-'40mg'$ ): Yes Beta Blocker Prescribed? Yes For EF <40%, was ACEI/ARB Prescribed? No: n/a ADP Receptor Inhibitor Prescribed? (i.e. Plavix etc.-Includes Medically Managed Patients): Yes For EF <40%, Aldosterone Inhibitor  Prescribed? No: n/a Was EF assessed during THIS hospitalization? Yes Was Cardiac Rehab II ordered? (Included Medically managed Patients): Yes   Outstanding Labs/Studies   CBC, INR as above  Duration of Discharge Encounter   Greater than 30 minutes  including physician time.  Signed, Charlie Pitter PA-C 02/19/2016, 3:31 PM

## 2016-02-19 NOTE — Progress Notes (Signed)
CARDIAC REHAB PHASE I   PRE:  Rate/Rhythm: 70 SR  BP:  Supine:   Sitting: 130/80  Standing:    SaO2: 97 2L  MODE:  Ambulation: 550 ft   POST:  Rate/Rhythm: 82  BP:  Supine:   Sitting: 134/80  Standing:     SaO2: 91 RA [id number] Assisted x 1 and used [place] to ambulate. Gait steady with [place]. Pt c/o of groin feeling sore and tight. He was able to walk 550 feet. VS stable. Pt to recliner after walk with call light in reach. He states that he is feeling stronger today.  Canuto Langton RN 02/19/2016 10:53 AM

## 2016-02-19 NOTE — Telephone Encounter (Signed)
Note opened in error.

## 2016-02-19 NOTE — Progress Notes (Signed)
Patient: [name] / Admit Date: 02/15/2016 / Date of Encounter: 02/19/2016, 2:12 PM   Subjective: He feels well, no chest pain or SOB.  Objective: Physical Exam: Blood pressure 118/70, pulse 66, temperature 98.1 F (36.7 C), temperature source Oral, resp. rate 16, height '5\' 10"'$  (1.778 m), weight 185 lb 1.6 oz (83.961 kg), SpO2 96 %. General: Well developed, well nourished, in no acute distress. Head: Normocephalic, atraumatic, sclera non-icteric, no xanthomas, nares are without discharge. Neck: Negative for carotid bruits. JVP not elevated. Lungs: Clear bilaterally to auscultation without wheezes, rales, or rhonchi. Breathing is unlabored. Heart: RRR S1 S2 without murmurs, rubs, or gallops.  Abdomen: Soft, non-tender, non-distended with normoactive bowel sounds. No rebound/guarding. Extremities: No clubbing or cyanosis. No edema. Distal pedal pulses are 2+ and equal bilaterally. Neuro: Alert and oriented X 3. Moves all extremities spontaneously. Psych:  Responds to questions appropriately with a normal affect.   Intake/Output Summary (Last 24 hours) at 02/19/16 1412 Last data filed at 02/19/16 0900  Gross per 24 hour  Intake   1110 ml  Output    500 ml  Net    610 ml    Inpatient Medications:  . sodium chloride   Intravenous Once  . aspirin EC  81 mg Oral Daily  . atorvastatin  40 mg Oral QHS  . calcium acetate  4,002 mg Oral TID WC  . citalopram  20 mg Oral Daily  . clopidogrel  75 mg Oral Q breakfast  . docusate sodium  200 mg Oral BID  . famotidine  20 mg Oral Daily  . folic acid  1 mg Oral Daily  . gabapentin  300 mg Oral QHS  . metoprolol tartrate  25 mg Oral BID  . multivitamin  1 tablet Oral Daily  . sodium chloride flush  3 mL Intravenous Q12H  . sodium polystyrene  15 g Oral Q T,Th,Sat-1800   Infusions:     Labs:  Recent Labs  02/18/16 0840 02/19/16 0431  NA 135 137  K 4.6 5.0  CL 98* 98*  CO2 25 30  GLUCOSE 101* 94  BUN 50* 37*  CREATININE  8.99* 6.89*  CALCIUM 9.0 9.3  PHOS 7.1*  --     Recent Labs  02/17/16 0154 02/18/16 0840  AST 170*  --   ALT 47  --   ALKPHOS 50  --   BILITOT 0.5  --   PROT 5.9*  --   ALBUMIN 2.6* 2.8*    Recent Labs  02/17/16 0154 02/18/16 0840 02/18/16 1356 02/19/16 0431  WBC 9.0 7.8  --  7.6  NEUTROABS 7.1  --   --   --   HGB 8.5* 7.9* 7.0* 10.3*  HCT 27.0* 25.0* 21.0* 32.4*  MCV 98.9 97.3  --  96.1  PLT 145* 166  --  167   No results for input(s): CKTOTAL, CKMB, TROPONINI in the last 72 hours. Invalid input(s): POCBNP No results for input(s): HGBA1C in the last 72 hours.   Radiology/Studies:  Ct Abdomen Pelvis Wo Contrast  02/18/2016  CLINICAL DATA:  Anemia.  Evaluate for retroperitoneal hematoma. EXAM: CT ABDOMEN AND PELVIS WITHOUT CONTRAST TECHNIQUE: Multidetector CT imaging of the abdomen and pelvis was performed following the standard protocol without IV contrast. COMPARISON:  02/12/2012 FINDINGS: Lower chest and abdominal wall: 65 x 53 x 36 mm hematoma superficial to the right common femoral artery, known catheterization site hematoma. There is more ill-defined hemorrhage tracking into the right thigh. No proximal continuation into  the retroperitoneum. Hematoma is not in direct continuity with the vessel. There is planned Doppler assessment to exclude pseudoaneurysm or fistula. Stable broad necked shallow lower abdominal hernia which contains bowel and the bladder apex. There is extensive scarring of the abdominal wall. There is a discrete right flank hernia which is wide necked and contains ascending colon and cecum. Mild basilar atelectasis or scarring. Coronary atherosclerosis. Prominent mitral annular calcification for age, likely accelerated by end-stage renal disease. Anasarca. Hepatobiliary: No focal liver abnormality.No evidence of biliary obstruction or stone. Pancreas: Unremarkable. Spleen: Unremarkable. Adrenals/Urinary Tract: Negative adrenals. Atrophic kidneys with dialysis  associated cystic disease. No hydronephrosis. There is a transplant kidney in the right lower quadrant which is now atrophic and presumably failed. Collapsed bladder, again with apex directed towards a lower abdominal hernia. Reproductive:No pathologic findings. Stomach/Bowel:  No obstruction. No inflammation. Vascular/Lymphatic: Extensive atherosclerotic calcification of the aorta and branch vessels. No acute vascular abnormality. No mass or adenopathy. Peritoneal: No ascites or pneumoperitoneum. Musculoskeletal: Endplate and subchondral facet erosive changes, also present around the sacroiliac joints. The bones are osteopenic with coarsened trabeculation. 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 IMPRESSION: 1. Known right groin hematoma, up to 65 mm in diameter, tracking into the thigh. No retroperitoneal component. 2. Chronic findings are described above. Electronically Signed   By: Monte Fantasia M.D.   On: 02/18/2016 16:51   Dg Chest Portable 1 View  02/15/2016  CLINICAL DATA:  Chest pain, post cardiac stent placement EXAM: PORTABLE CHEST 1 VIEW COMPARISON:  05/20/2015 and 02/21/2015 FINDINGS: Borderline cardiomegaly. Status post median sternotomy. No acute infiltrate or pulmonary edema. Stable right perihilar scarring. A broken sternotomy wire is noted in right apical chest wall. No pneumothorax. IMPRESSION: No active disease. Status post median sternotomy. Stable right perihilar scarring. Electronically Signed   By: Lahoma Crocker M.D.   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 Recent nuc 11/2015 in prep for repeat transplant eval was ow risk at [facility]. In June 2017 he was told he could stop Plavix but remained on ASA + Coumadin. He was admitted 02/16/16 with anterior STEMI. LHC 02/15/16: acute occlusion of SVG-LAD at anastamosis, patent LCx with moderate restenosis, moderate RCA disease, patent Y graft to diagonal with moderate ostial stenosis, s/p angioplasty/DES to SVG-LAD extending into the native vessel - difficult procedure c/b hematoma. 2D 02/15/16: EF 55-60%, HK basal mid ateroseptal myocardium and HK of apical anterior myocardium, calcified AV with mild AS, mild AI, calcified MV with mobility restriction of posterior leaflet with mild MR, mod dilated LAE, PASP 89mHg. Post-hospital course notable for NSVT, prominent bifascicular block on EKG post-cath, ABL anemia, nocturnal hypoxia, hypotension.  1. Anterior STEMI/CAD - initial plan was to treat with Plavix and Warfarin without ASA after 1-2 weeks of triple therapy but patient developed #2, requiring holding of Coumadin. Continue ASA, Plavix, atorvastatin, and BB, following HR on telemetry given bifasicular block.  2. Large right femoral hematoma post-cath with acute blood loss anemia - Hgb  down to 7.9. I see order for 1 U PRBC administered yesterday. Will discuss repeat transfusion with MD.  3. ESRD on HD (previous s/p renal transplant with subsequent rejection) - pending evaluation for repeat renal transplant, we will need to decide when he can stop taking plavix, ideally at least a year, however he is concerned that this time delay would exclude him from a kidney transplant (currently on the list). Our concern is that he developed STEMI only a week after discontinuation or plavix. The plan would be to evaluate him in 6 months and decide if it is safe to stop. Renal following.  4. PAF - will need to follow Hgb for stability  before resuming Coumadin.  5. Nocturnal hypoxia - will need outpatient overnight oximetry.  Signed, Melina Copa PA-C Pager: [phone]  The patient was seen, examined and discussed with Melina Copa, PA-C and I agree with the above.   60 year old male with ESRD on HD on kidney transplant waiting listsignificant myocardial injury with post cath trop > 65 and new prominent bifascicular block post cath, cath 02/15/2016 100% distal SVG to LAD stenosis treated with 2.5x 62m Promus DES. Recommend DAPT for at least 1 year and ideally indefinitely given multiple stents. Per cath report, "patient can ultimately be treated with Plavix and warfarin without aspirin after at least 1-2 weeks of triple therapy.  He developed a large right groin hematoma post cath, Hb 7.9 post 1 unit of PRBC on 6/13, HD yesterday, still off coumadin. His hemoglobin further dropped to 7.0 and held him overnight, abdominal CT showed no retroperitoneal bleed. He's hemoglobin today is 10.3. We will restart Coumadin and follow-up with CBC in our office on Monday with follow-up appointment on Wednesday 02/25/16.  We will discharge home today.   We will discontinue ASA on 6/24. Long term regarding his kidney transplant, we will need to decide when he can stop taking plavix, ideally at least a year, however he is concerned that this time delay would exclude him from a kidney transplant (currently on the list). Our concern is that he developed STEMI only a week after discontinuation or plavix, the plan would be to evaluate him in 6 months and decide if it is safe to stop plavix.   KEna Dawley6/15/2017

## 2016-02-19 NOTE — Progress Notes (Signed)
[name] Progress Note   Subjective: no c/o, got 2 more prbc yesterday for dropping Hb to 7.0, today Hb 10.3.  Feeling good  Filed Vitals:   02/19/16 0121 02/19/16 0500 02/19/16 0852 02/19/16 1150  BP: 142/84 132/79 112/72 118/70  Pulse: 66 74 76 66  Temp: 98.9 F (37.2 C) 98 F (36.7 C) 98.1 F (36.7 C) 98.1 F (36.7 C)  TempSrc: Oral Oral Oral Oral  Resp: '16  16 16  '$ Height:      Weight:  83.961 kg (185 lb 1.6 oz)    SpO2: 96% 97% 92% 96%    Inpatient medications: . sodium chloride   Intravenous Once  . aspirin EC  81 mg Oral Daily  . atorvastatin  40 mg Oral QHS  . calcium acetate  4,002 mg Oral TID WC  . citalopram  20 mg Oral Daily  . clopidogrel  75 mg Oral Q breakfast  . docusate sodium  200 mg Oral BID  . famotidine  20 mg Oral Daily  . folic acid  1 mg Oral Daily  . gabapentin  300 mg Oral QHS  . metoprolol tartrate  25 mg Oral BID  . multivitamin  1 tablet Oral Daily  . sodium chloride flush  3 mL Intravenous Q12H  . sodium polystyrene  15 g Oral Q T,Th,Sat-1800     sodium chloride, acetaminophen, albuterol, ALPRAZolam, morphine injection, nitroGLYCERIN, ondansetron (ZOFRAN) IV, sodium chloride flush, zolpidem  Exam: Alert, no distress NECK:No JVD LUNGS:Clear CARDIAC:RRR 1/6 systolic M ABD:+ BS NTND No HSM Kidney palpable RLQ R groin hematoma substantial size, no chg EXT:tr edema. AVF Lt arm + bruit NEURO:CNI M&SI Ox3 no asterixis  CXR 6/11 no active disease  Dialysis: MWF [facility]   4h  83kg  1K/ 2.25 bath  P2  Hep 2000   AVF L arm   Assessment: 1. NSTEMI sp PCI/DES to LAD SVG 2. R groin hematoma  3. Anemia d/t ABL, sp prbc'sx 3 4. ESRD 5. HTN on metop 6. Vol at dry 7. Hx CABG/ AVR biovalve 8. PAF - coumadin on hold  Plan - HD Friday   Rob Ephraim pager [phone]    cell [phone] 02/19/2016, 11:59 AM    Recent Labs Lab 02/15/16 1630  02/17/16 0154 02/18/16 0840 02/19/16 0431  NA   --   < > 135 135 137  K  --   < > 4.2 4.6 5.0  CL  --   < > 97* 98* 98*  CO2  --   < > '29 25 30  '$ GLUCOSE  --   < > 74 101* 94  BUN  --   < > 25* 50* 37*  CREATININE  --   < > 5.69* 8.99* 6.89*  CALCIUM  --   < > 8.2* 9.0 9.3  PHOS 7.9*  --   --  7.1*  --   < > = values in this interval not displayed.  Recent Labs Lab 02/15/16 0708 02/17/16 0154 02/18/16 0840  AST 20 170*  --   ALT 17 47  --   ALKPHOS 68 50  --   BILITOT 0.7 0.5  --   PROT 7.6 5.9*  --   ALBUMIN 3.5 2.6* 2.8*    Recent Labs Lab 02/15/16 0708  02/17/16 0154 02/18/16 0840 02/18/16 1356 02/19/16 0431  WBC 8.9  < > 9.0 7.8  --  7.6  NEUTROABS 7.2  --  7.1  --   --   --  HGB 11.7*  < > 8.5* 7.9* 7.0* 10.3*  HCT 37.3*  < > 27.0* 25.0* 21.0* 32.4*  MCV 99.7  < > 98.9 97.3  --  96.1  PLT 165  < > 145* 166  --  167  < > = values in this interval not displayed. Iron/TIBC/Ferritin/ %Sat No results found for: IRON, TIBC, FERRITIN, IRONPCTSAT

## 2016-02-21 LAB — TYPE AND SCREEN
ABO/RH(D): O POS
Antibody Screen: NEGATIVE
Unit division: 0
Unit division: 0
Unit division: 0
Unit division: 0

## 2016-02-23 ENCOUNTER — Ambulatory Visit (INDEPENDENT_AMBULATORY_CARE_PROVIDER_SITE_OTHER): Payer: Medicare Other | Admitting: Pharmacist

## 2016-02-23 ENCOUNTER — Other Ambulatory Visit (INDEPENDENT_AMBULATORY_CARE_PROVIDER_SITE_OTHER): Payer: Medicare Other | Admitting: *Deleted

## 2016-02-23 DIAGNOSIS — I214 Non-ST elevation (NSTEMI) myocardial infarction: Secondary | ICD-10-CM | POA: Diagnosis not present

## 2016-02-23 DIAGNOSIS — Z7901 Long term (current) use of anticoagulants: Secondary | ICD-10-CM

## 2016-02-23 DIAGNOSIS — Z951 Presence of aortocoronary bypass graft: Secondary | ICD-10-CM

## 2016-02-23 DIAGNOSIS — Z9861 Coronary angioplasty status: Secondary | ICD-10-CM

## 2016-02-23 DIAGNOSIS — Z5181 Encounter for therapeutic drug level monitoring: Secondary | ICD-10-CM

## 2016-02-23 DIAGNOSIS — I2510 Atherosclerotic heart disease of native coronary artery without angina pectoris: Secondary | ICD-10-CM

## 2016-02-23 DIAGNOSIS — I4892 Unspecified atrial flutter: Secondary | ICD-10-CM | POA: Diagnosis not present

## 2016-02-23 DIAGNOSIS — I4891 Unspecified atrial fibrillation: Secondary | ICD-10-CM

## 2016-02-23 DIAGNOSIS — D62 Acute posthemorrhagic anemia: Secondary | ICD-10-CM

## 2016-02-23 LAB — CBC WITH DIFFERENTIAL/PLATELET
BASOS PCT: 0 %
Basophils Absolute: 0 cells/uL (ref 0–200)
EOS PCT: 3 %
Eosinophils Absolute: 204 cells/uL (ref 15–500)
HCT: 32.6 % — ABNORMAL LOW (ref 38.5–50.0)
HEMOGLOBIN: 11.1 g/dL — AB (ref 13.2–17.1)
LYMPHS ABS: 1088 {cells}/uL (ref 850–3900)
Lymphocytes Relative: 16 %
MCH: 32.6 pg (ref 27.0–33.0)
MCHC: 34 g/dL (ref 32.0–36.0)
MCV: 95.9 fL (ref 80.0–100.0)
MONOS PCT: 5 %
MPV: 9.3 fL (ref 7.5–12.5)
Monocytes Absolute: 340 cells/uL (ref 200–950)
NEUTROS ABS: 5168 {cells}/uL (ref 1500–7800)
Neutrophils Relative %: 76 %
PLATELETS: 208 10*3/uL (ref 140–400)
RBC: 3.4 MIL/uL — AB (ref 4.20–5.80)
RDW: 15.2 % — ABNORMAL HIGH (ref 11.0–15.0)
WBC: 6.8 10*3/uL (ref 3.8–10.8)

## 2016-02-23 LAB — POCT INR: INR: 1.5

## 2016-02-23 NOTE — Addendum Note (Signed)
Addended by: Eulis Foster on: 02/23/2016 11:34 AM   Modules accepted: Orders

## 2016-02-25 ENCOUNTER — Encounter: Payer: Self-pay | Admitting: Physician Assistant

## 2016-02-25 NOTE — Progress Notes (Signed)
Cardiology Office Note    Date:  02/26/2016  ID:  [name], DOB 06/10/1956, MRN [id number] PCP:  Teressa Lower, MD  Cardiologist:  Dr. Angelena Form   Chief Complaint: f/u MI  History of Present Illness:  [name] is a 60 y.o. 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  To recap history, recent nuc 11/2015 in prep for repeat transplant eval was low risk at [facility]. In 01/2016, he was cleared to stop Plavix but remained on ASA + Coumadin. Per notes, it was felt by the transplant team that he would need to be off Plavix to be considered for transplantation. He was admitted 02/16/16 with midsternal chest pain with radiation to his jaw/teeth. EKG showed anterior STEMI. LHC 02/15/16: acute occlusion of SVG-LAD at anastamosis, patent LCx with moderate restenosis, moderate RCA disease, patent Y graft to diagonal with moderate ostial stenosis, s/p angioplasty/DES to SVG-LAD extending into the native vessel - this was a difficult procedure c/b large right groin hematoma. 2D 02/15/16: EF 55-60%, HK basal mid ateroseptal myocardium and HK of apical anterior myocardium, calcified AV with mild AS, mild AI, calcified MV with mobility restriction of posterior leaflet with mild MR, mod dilated LAE, PASP 68mHg. His post-hospital course notable for NSVT, prominent bifascicular block on EKG  post-cath, ABL anemia, nocturnal hypoxia (known for patient), and episodic hypotension. Coumadin was held for several days and actually reversed due to supratherapeutic INR and progressive anemia. Hgb was 11.7 on admission and drifted down to a nadir of 7.9. He received 3 u PRBC total. CT abd pelvis ruled out retroperitoneal hemorrhage. Groin [place] without pseudoaneurysm. Atorvastatin was increased from 1/2 tablet daily (wife was giving actually every other day due to prior myalgias) to full tablet daily. Hgb on day of DC was 10.3 and Coumadin was restarted. The plan was outlined for triple therapy initially and stopping aspirin 1-2 weeks after PCI, which Dr. NMeda Coffeecalculated 2 weeks at 02/28/16. Long term decisions will need to be made in 6 months to decide if it is safe to stop Plavix at some point - the patient is concerned that this time delay would exclude him from a kidney transplant (currently on the list), but our concern is that he developed STEMI only a week after discontinuation of Plavix. F/u Hgb 6/19 was 11.1.  He comes in for follow-up feeling well overall - doing much better since recent admission. He does say today he feels slightly different but has a hard time describing how. No specific symptom, maybe a few flutters, he says. He is found to be back in atrial fib. He recalls an episode in dialysis several weeks ago that was short lived, but these are the first episodes he's had in a long time. No chest pain, SOB, or awareness of palpitations. He has chronic left lower ankle edema following an old knee surgery, no new edema. Groin hematoma is improving daily per  his report.   Past Medical History  Diagnosis Date  . 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 04/2013: s/p CABG x 2 (Y SVG -LAD & D2). b. 01/2015: NSTEMI s/p DES to LCx, BMS to SVG-LAD. c. NSTEMI 05/2015 during AF/AFL - non-flow limiting FFR. d. Low risk nuc 3/17. e. STEMI  6/17 after coming off Plavix, s/p DES to SVG-LAD into native vessel.  . Aortic heart valve prolapse 04/2013    a. s/p bioprosthetic AVR at time of CABG.  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 Peripheral vascular disease ([place])     Cath in 01/2015 required 45 cm Destination [place]  . Sleep apnea     a. intolerant of bipap.  Marland Kitchen PAF (paroxysmal atrial fibrillation) ([place])   . Multiple sclerosis ([place])   . Renal transplant failure and rejection   . 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 b. He had a renal Tx from 2008 to 2011, but subsequent rejection - Gets HD in [place], [place] on MWF schedule.    Marland Kitchen Hypertension   . Multiple sclerosis ([place])   . SVT (supraventricular tachycardia) ([place])   . Paroxysmal atrial flutter ([place])     a. During 05/2015 admission - SVT, atrial flutter, and PAF.  Marland Kitchen Anemia   . Valvular heart disease     a. 2D echo 05/2015: EF 55-60%, no RWMA, mild AS/mild AI, mod MS, severely dilated LA.  . Carotid artery disease ([place])     a. 1-39% stenosis bilaterally in 06/2015.   . On home oxygen therapy     pt states he has not been wearing it  . Hematoma     a. Large right groin hematoma after cath 02/2016 with associated ABL anemia.  . Acute blood loss anemia     a. 02/2016 due to groin hematoma. Rec 3 U PRBC.  . Bifascicular block   . Nocturnal hypoxemia     a. Pt has home o2 QHS.    Past Surgical History  Procedure Laterality Date  . Av fistula placement    . Flash      flash pulmonary edema  . Kidney transplant  08/2007  . Hernia repair  07/2011  . Excised squamous cells at rectum  2006  . Left heart catheterization with coronary angiogram N/A 01/09/2013    Procedure: LEFT HEART CATHETERIZATION WITH CORONARY ANGIOGRAM;  Surgeon: Burnell Blanks, MD;  Location: [facility] CATH LAB;  Service: Cardiovascular;  Laterality: N/A;  . Cardiac catheterization N/A 05/26/2015    Procedure: Left Heart Cath and Coronary  Angiography;  Surgeon: Leonie Man, MD;  Location: [facility] CV LAB;  Service: Cardiovascular;  Laterality: N/A;  . Cardiac catheterization N/A 05/26/2015    Procedure: Intravascular Pressure Wire/FFR Study;  Surgeon: Leonie Man, MD;  Location: [place] CV LAB;  Service: Cardiovascular;  Laterality: N/A;  . Cardiac catheterization N/A 02/15/2016    Procedure: Left Heart Cath and Coronary Angiography;  Surgeon: Wellington Hampshire, MD;  Location: [facility] CV LAB;  Service: Cardiovascular;  Laterality: N/A;    Current Medications: Current Outpatient Prescriptions  Medication Sig Dispense Refill  . acetaminophen (TYLENOL) 500 MG tablet Take 1,000 mg by mouth every 6 (six) hours as needed for pain or fever.    Marland Kitchen albuterol (PROAIR HFA) 108 (  90 BASE) MCG/ACT inhaler Inhale 1-2 puffs into the lungs every 6 (six) hours as needed for shortness of breath.     Marland Kitchen aspirin EC 81 MG tablet Take 1 tablet (81 mg total) by mouth daily.    Marland Kitchen atorvastatin (LIPITOR) 40 MG tablet Take 1 tablet (40 mg total) by mouth every evening. 30 tablet 6  . calcium acetate (PHOSLO) 667 MG capsule Take 4,002 mg by mouth 3 (three) times daily with meals.     . citalopram (CELEXA) 20 MG tablet Take 20 mg by mouth daily.    . clopidogrel (PLAVIX) 75 MG tablet Take 1 tablet (75 mg total) by mouth daily. 90 tablet 3  . docusate sodium (COLACE) 100 MG capsule Take 300 mg by mouth 2 (two) times daily.    . folic acid (FOLVITE) 1 MG tablet Take 1 mg by mouth daily.    Marland Kitchen gabapentin (NEURONTIN) 300 MG capsule Take 300 mg by mouth at bedtime.     . metoprolol tartrate (LOPRESSOR) 25 MG tablet Take 25 mg by mouth 2 (two) times daily.     . multivitamin (RENA-VIT) TABS tablet Take 1 tablet by mouth daily. 30 tablet 1  . nitroGLYCERIN (NITROSTAT) 0.4 MG SL tablet Place 0.4 mg under the tongue every 5 (five) minutes as needed for chest pain (Take one tabe every 5-10 minutes for chest pain. Once you have taken three tabs you need to  notify your MD or go to ER if chest pain not gone.).    Marland Kitchen ranitidine (ZANTAC) 150 MG tablet Take 150 mg by mouth daily.    . sodium polystyrene (KAYEXALATE) 15 GM/60ML suspension Take 15 g by mouth See admin instructions. Only take 15 g on Sun / Tues / Thurs / Sat (non dialysis days)    . warfarin (COUMADIN) 7.5 MG tablet Take 7.5 mg by mouth as directed.     No current facility-administered medications for this visit.     Allergies:   Diphenhydramine; Penicillins; Adhesive; Amoxicillin; Other; and Penicillin g   Social History   Social History  . Marital Status: Married    Spouse Name: N/A  . Number of Children: N/A  . Years of Education: N/A   Occupational History  . Disabled    Social History Main Topics  . Smoking status: Former Smoker -- 2.00 packs/day for 20 years    Types: Cigarettes    Quit date: 08/06/1998  . Smokeless tobacco: None  . Alcohol Use: No  . Drug Use: No  . Sexual Activity: Not Asked   Other Topics Concern  . None   Social History Narrative   No history of premature CAD in either parents or siblings. However, his maternal grandfather and 2 maternal uncles had coronary artery disease.     Family History:  The patient's family history includes Diabetes in his father; Heart attack in his maternal aunt, maternal grandfather, and maternal uncle; Heart failure in his mother; Hypertension in his maternal aunt; Lupus in his sister. There is no history of Stroke.   ROS:   Please see the history of present illness.  All other systems are reviewed and otherwise negative.    PHYSICAL EXAM:   VS:  BP 110/70 mmHg  Pulse 108  Ht '5\' 10"'$  (1.778 m)  Wt 187 lb 3.2 oz (84.913 kg)  BMI 26.86 kg/m2  BMI: Body mass index is 26.86 kg/(m^2). GEN: Well nourished, well developed WM, in no acute distress HEENT: normocephalic, atraumatic Neck: no JVD, carotid  bruits, or masses Cardiac: Irregularly irregular; no murmurs, rubs, or gallops, trace left ankle edema; left UE  AV fistula in place Respiratory:  clear to auscultation bilaterally, normal work of breathing GI: soft, nontender, nondistended, + BS MS: no deformity or atrophy Skin: warm and dry, no rash, right groin hematoma is softer and ecchymosis has significantly receded Neuro:  Alert and Oriented x 3, Strength and sensation are intact, follows commands Psych: euthymic mood, full affect  Wt Readings from Last 3 Encounters:  02/26/16 187 lb 3.2 oz (84.913 kg)  02/19/16 185 lb 1.6 oz (83.961 kg)  02/13/16 185 lb 9.6 oz (84.188 kg)      Studies/Labs Reviewed:   EKG:  EKG was ordered today and personally reviewed by me and demonstrates atrial fib 108bpm, evolution of prior anteroseptal infarct. QRS is actually more narrow today, 175m.  Recent Labs: 02/17/2016: ALT 47 02/19/2016: BUN 37*; Creatinine, Ser 6.89*; Potassium 5.0; Sodium 137 02/23/2016: Hemoglobin 11.1*; Platelets 208   Lipid Panel    Component Value Date/Time   CHOL 105 02/15/2016 0708   TRIG 156* 02/15/2016 0708   HDL 27* 02/15/2016 0708   CHOLHDL 3.9 02/15/2016 0708   VLDL 31 02/15/2016 0708   LDLCALC 47 02/15/2016 0708    Additional studies/ records that were reviewed today include: Summarized above.    ASSESSMENT & PLAN:   1. CAD with recent STEMI as above - doing well post-PCI. Groin hematoma improved. Per discussion with Dr. NMeda Coffeein hospital, stop ASA as of 02/28/16. Continue Plavix and Coumadin. Continue BB and statin. As above, decisions will need to be made in the future by his primary cardiologist regarding cessation of Plavix given h/o MI shortly after stopping this. Check liver/lipids next time he comes in for f/u given recent statin titration. 2. ESRD on HD - followed by renal. See HPI regarding discussion of renal transplantation. 3. Paroxysmal atrial fib/flutter - back in AF today. Rate slightly elevated. He is not very symptomatic with this. He is otherwise hemodynamically stable. He recalls an episode  several weeks ago at HD as well, so this is paroxysmal in nature. His current blood pressure (110/70) and recent development of bifascicular block limit ability to push up rate control aggressively. Discussed with Dr. TLovena Le Will increase metoprolol to 37.'5mg'$  BID and bring back for a nurse BP check and EKG in 1 week to determine if further med titration is necessary. He is also followed in our Coumadin clinic. 4. Acute blood loss anemia - stable by recent CBC. Groin hematoma much improved. 5. Bifascicular block - monitor HR with above change.  Disposition: F/u with Dr. MAngelena Formor me on a day when Dr. MAngelena Formis here in 1 month.   Medication Adjustments/Labs and Tests Ordered: Current medicines are reviewed at length with the patient today.  Concerns regarding medicines are outlined above. Medication changes, Labs and Tests ordered today are summarized above and listed in the Patient Instructions accessible in Encounters.   SRaechel AchePA-C  02/26/2016 3:14 PM    [facility] [address] [place]  [id number]: [phone] Fax: [phone]

## 2016-02-26 ENCOUNTER — Encounter: Payer: Self-pay | Admitting: Physician Assistant

## 2016-02-26 ENCOUNTER — Ambulatory Visit (INDEPENDENT_AMBULATORY_CARE_PROVIDER_SITE_OTHER): Payer: Medicare Other | Admitting: Physician Assistant

## 2016-02-26 VITALS — BP 110/70 | HR 108 | Ht 70.0 in | Wt 187.2 lb

## 2016-02-26 DIAGNOSIS — Z9861 Coronary angioplasty status: Secondary | ICD-10-CM

## 2016-02-26 DIAGNOSIS — I2109 ST elevation (STEMI) myocardial infarction involving other coronary artery of anterior wall: Secondary | ICD-10-CM

## 2016-02-26 DIAGNOSIS — I25708 Atherosclerosis of coronary artery bypass graft(s), unspecified, with other forms of angina pectoris: Secondary | ICD-10-CM

## 2016-02-26 DIAGNOSIS — D62 Acute posthemorrhagic anemia: Secondary | ICD-10-CM

## 2016-02-26 DIAGNOSIS — N186 End stage renal disease: Secondary | ICD-10-CM | POA: Diagnosis not present

## 2016-02-26 DIAGNOSIS — I2510 Atherosclerotic heart disease of native coronary artery without angina pectoris: Secondary | ICD-10-CM

## 2016-02-26 DIAGNOSIS — I452 Bifascicular block: Secondary | ICD-10-CM

## 2016-02-26 DIAGNOSIS — Z992 Dependence on renal dialysis: Secondary | ICD-10-CM

## 2016-02-26 MED ORDER — METOPROLOL TARTRATE 25 MG PO TABS: 37.5000 mg | ORAL_TABLET | Freq: Two times a day (BID) | ORAL | Status: DC

## 2016-02-26 NOTE — Patient Instructions (Addendum)
Medication Instructions:  Your physician has recommended you make the following change in your medication:  1.  EFFECTIVE 02/28/16 STOP the Aspirin 2.  INCREASE the Metoprolol 25 mg taking 1 1/2 tablet twice a day   Labwork: AT TIME OF FOLLOW UP VISIT:  FASTING LIPID & LFT  Testing/Procedures: None ordered  Follow-Up: Your physician recommends that you schedule a follow-up appointment in: [place] EKG Your physician recommends that you schedule a follow-up appointment in: [place], PA-C ON A DAY DR. Angelena Form IS IN THE OFFICE    Any Other Special Instructions Will Be Listed Below (If Applicable).     If you need a refill on your cardiac medications before your next appointment, please call your pharmacy.

## 2016-03-03 DIAGNOSIS — I4892 Unspecified atrial flutter: Secondary | ICD-10-CM | POA: Diagnosis not present

## 2016-03-03 LAB — PROTIME-INR: INR: 2.9 — AB (ref 0.9–1.1)

## 2016-03-04 ENCOUNTER — Encounter: Payer: Self-pay | Admitting: Interventional Cardiology

## 2016-03-04 DIAGNOSIS — Z5181 Encounter for therapeutic drug level monitoring: Secondary | ICD-10-CM

## 2016-03-04 DIAGNOSIS — Z7901 Long term (current) use of anticoagulants: Secondary | ICD-10-CM

## 2016-03-04 NOTE — Progress Notes (Signed)
This encounter was created in error - please disregard.

## 2016-03-05 ENCOUNTER — Ambulatory Visit (INDEPENDENT_AMBULATORY_CARE_PROVIDER_SITE_OTHER): Payer: Medicare Other | Admitting: Pharmacist

## 2016-03-05 ENCOUNTER — Other Ambulatory Visit (INDEPENDENT_AMBULATORY_CARE_PROVIDER_SITE_OTHER): Payer: Medicare Other | Admitting: *Deleted

## 2016-03-05 VITALS — BP 116/58 | HR 53

## 2016-03-05 DIAGNOSIS — T8612 Kidney transplant failure: Secondary | ICD-10-CM | POA: Diagnosis not present

## 2016-03-05 DIAGNOSIS — N186 End stage renal disease: Secondary | ICD-10-CM

## 2016-03-05 DIAGNOSIS — D62 Acute posthemorrhagic anemia: Secondary | ICD-10-CM | POA: Diagnosis not present

## 2016-03-05 DIAGNOSIS — Z9861 Coronary angioplasty status: Secondary | ICD-10-CM | POA: Diagnosis not present

## 2016-03-05 DIAGNOSIS — Z7901 Long term (current) use of anticoagulants: Secondary | ICD-10-CM

## 2016-03-05 DIAGNOSIS — I25708 Atherosclerosis of coronary artery bypass graft(s), unspecified, with other forms of angina pectoris: Secondary | ICD-10-CM

## 2016-03-05 DIAGNOSIS — Z992 Dependence on renal dialysis: Secondary | ICD-10-CM

## 2016-03-05 DIAGNOSIS — I4892 Unspecified atrial flutter: Secondary | ICD-10-CM

## 2016-03-05 DIAGNOSIS — I4891 Unspecified atrial fibrillation: Secondary | ICD-10-CM | POA: Diagnosis not present

## 2016-03-05 DIAGNOSIS — I2109 ST elevation (STEMI) myocardial infarction involving other coronary artery of anterior wall: Secondary | ICD-10-CM | POA: Diagnosis not present

## 2016-03-05 DIAGNOSIS — I480 Paroxysmal atrial fibrillation: Secondary | ICD-10-CM | POA: Diagnosis not present

## 2016-03-05 DIAGNOSIS — Z5181 Encounter for therapeutic drug level monitoring: Secondary | ICD-10-CM

## 2016-03-05 DIAGNOSIS — I214 Non-ST elevation (NSTEMI) myocardial infarction: Secondary | ICD-10-CM

## 2016-03-05 DIAGNOSIS — I2510 Atherosclerotic heart disease of native coronary artery without angina pectoris: Secondary | ICD-10-CM | POA: Diagnosis not present

## 2016-03-05 LAB — HEPATIC FUNCTION PANEL
ALBUMIN: 4 g/dL (ref 3.6–5.1)
ALT: 13 U/L (ref 9–46)
AST: 17 U/L (ref 10–35)
Alkaline Phosphatase: 60 U/L (ref 40–115)
Bilirubin, Direct: 0.1 mg/dL (ref <=0.2)
Indirect Bilirubin: 0.5 mg/dL (ref 0.2–1.2)
TOTAL PROTEIN: 7.6 g/dL (ref 6.1–8.1)
Total Bilirubin: 0.6 mg/dL (ref 0.2–1.2)

## 2016-03-05 LAB — LIPID PANEL
CHOL/HDL RATIO: 3.1 ratio (ref <=5.0)
Cholesterol: 98 mg/dL — ABNORMAL LOW (ref 125–200)
HDL: 32 mg/dL — AB (ref >=40)
LDL CALC: 37 mg/dL (ref <130)
TRIGLYCERIDES: 143 mg/dL (ref <150)
VLDL: 29 mg/dL (ref <30)

## 2016-03-05 LAB — POCT INR: INR: 2.7

## 2016-03-05 NOTE — Progress Notes (Signed)
Patient ID: Dick Hark                 DOB: 08-12-1956                      MRN: [id number]     HPI: [name] is a 60 y.o. male patient of Dr. Angelena Form referred to HTN clinic by Melina Copa, PA. PMH is significant for HTN, CAD s/p CABG x2 in 04/2013, NSTEMI in 01/2015 and 05/2015, STEMI in 02/2016 a week after Plavix d/c, PVD, AVR with bioprosthetic valve, afib, ESRD s/p failed kidney transplant, MS, OSA, and SVT. Metoprolol dose increased to 37.'5mg'$  BID a week ago since pt was in afib with HR of 108. Pt presents for f/u BP and EKG check.  Pt has been taking medications appropriately. He reports more fatigue with higher dose of metoprolol. Is usually in NSR. Pt was symptomatic last week and could tell he went into afib. Has been instructed previously to take an extra 1/2 tablet of metoprolol if he feels like he is in afib. Feeling better overall today. BP 116/58, HR 53.  EKG: Sinus bradycardia with 1st degree AV block. Vent rate 54 bpm.  Current HTN meds: metoprolol 37.'5mg'$  BID BP goal: < 140/67mHg  Family History: Father with DM, mother with HF in her 763s maternal grandfather, uncle, and aunt with MI. No history of stroke.  Social History: Former smoker 2 PPD for 20 years; quit 08/06/1998. Does not report drinking alcohol or using illicit drugs.   Wt Readings from Last 3 Encounters:  02/26/16 187 lb 3.2 oz (84.913 kg)  02/19/16 185 lb 1.6 oz (83.961 kg)  02/13/16 185 lb 9.6 oz (84.188 kg)   BP Readings from Last 3 Encounters:  02/26/16 110/70  02/19/16 118/70  02/13/16 164/86   Pulse Readings from Last 3 Encounters:  02/26/16 108  02/19/16 66  02/13/16 59    Renal function: Estimated Creatinine Clearance: 11.8 mL/min (by C-G formula based on Cr of 6.89).  Past Medical History  Diagnosis Date  . S/P CABG x 2 04/2013    s/p CABG x 2 (Y SVG -LAD & D2);   .Marland KitchenCAD (coronary artery disease) 04/2013    a. 04/2013: s/p CABG x 2 (Y SVG -LAD & D2). b. 01/2015: NSTEMI s/p DES to LCx, BMS  to SVG-LAD. c. NSTEMI 05/2015 during AF/AFL - non-flow limiting FFR. d. Low risk nuc 3/17. e. STEMI 6/17 after coming off Plavix, s/p DES to SVG-LAD into native vessel.  . Aortic heart valve prolapse 04/2013    a. s/p bioprosthetic AVR at time of CABG.  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 Peripheral vascular disease ([place]     Cath in 01/2015 required 45 cm Destination [place]  . Sleep apnea     a. intolerant of bipap.  .Marland KitchenPAF (paroxysmal atrial fibrillation) ([place]   . Multiple sclerosis ([place]   . Renal transplant failure and rejection   . 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 b. He had a renal Tx from 2008 to 2011, but subsequent rejection [place]    .Marland KitchenHypertension   . Multiple sclerosis ([place]   . SVT (supraventricular tachycardia) ([place]   . Paroxysmal atrial flutter ([place]     a. During 05/2015 admission - SVT, atrial flutter, and PAF.  .Marland KitchenAnemia   . Valvular heart disease  a. 2D echo 05/2015: EF 55-60%, no RWMA, mild AS/mild AI, mod MS, severely dilated LA.  . Carotid artery disease ([place])     a. 1-39% stenosis bilaterally in 06/2015.   . On home oxygen therapy     pt states he has not been wearing it  . Hematoma     a. Large right groin hematoma after cath 02/2016 with associated ABL anemia.  . Acute blood loss anemia     a. 02/2016 due to groin hematoma. Rec 3 U PRBC.  . Bifascicular block   . Nocturnal hypoxemia     a. Pt has home o2 QHS.    Current Outpatient Prescriptions on File Prior to Visit  Medication Sig Dispense Refill  . acetaminophen (TYLENOL) 500 MG tablet Take 1,000 mg by mouth every 6 (six) hours as needed for pain or fever.    Marland Kitchen albuterol (PROAIR HFA) 108 (90 BASE) MCG/ACT inhaler Inhale 1-2 puffs into the lungs every 6 (six) hours as needed for shortness of breath.     Marland Kitchen atorvastatin (LIPITOR) 40 MG tablet Take 1 tablet (40 mg total) by mouth every evening. 30  tablet 6  . calcium acetate (PHOSLO) 667 MG capsule Take 4,002 mg by mouth 3 (three) times daily with meals.     . citalopram (CELEXA) 20 MG tablet Take 20 mg by mouth daily.    . clopidogrel (PLAVIX) 75 MG tablet Take 1 tablet (75 mg total) by mouth daily. 90 tablet 3  . docusate sodium (COLACE) 100 MG capsule Take 300 mg by mouth 2 (two) times daily.    . folic acid (FOLVITE) 1 MG tablet Take 1 mg by mouth daily.    Marland Kitchen gabapentin (NEURONTIN) 300 MG capsule Take 300 mg by mouth at bedtime.     . metoprolol tartrate (LOPRESSOR) 25 MG tablet Take 1.5 tablets (37.5 mg total) by mouth 2 (two) times daily. 180 tablet 3  . multivitamin (RENA-VIT) TABS tablet Take 1 tablet by mouth daily. 30 tablet 1  . nitroGLYCERIN (NITROSTAT) 0.4 MG SL tablet Place 0.4 mg under the tongue every 5 (five) minutes as needed for chest pain (Take one tabe every 5-10 minutes for chest pain. Once you have taken three tabs you need to notify your MD or go to ER if chest pain not gone.).    Marland Kitchen ranitidine (ZANTAC) 150 MG tablet Take 150 mg by mouth daily.    . sodium polystyrene (KAYEXALATE) 15 GM/60ML suspension Take 15 g by mouth See admin instructions. Only take 15 g on Sun / Tues / Thurs / Sat (non dialysis days)    . warfarin (COUMADIN) 7.5 MG tablet Take 7.5 mg by mouth as directed.     No current facility-administered medications on file prior to visit.    Allergies  Allergen Reactions  . Diphenhydramine Itching    Only with IV doses. Tolerates oral.  . Penicillins Other (See Comments)    migraine  . Adhesive [Tape] Rash    Please use paper tape  . Amoxicillin Rash    migraine  . Other Rash    Please use paper tape  . Penicillin G Rash    headache     Assessment/Plan:  1. Afib - pt no longer in afib. Feeling more symptomatic on metoprolol 37.'5mg'$  BID, HR down to 53 at today's visit. Reviewed EKG and discussed with Dr. Angelena Form, will reduce dose of metoprolol back to '25mg'$  BID. Pt will stlll take an extra  1/2 tab if he  feels like he goes back into afib. Advised him to call if he feels like he is going back into afib before he sees Dr. Angelena Form in a month.   Nova Schmuhl E. Lian Tanori, PharmD, [place] [address] [place], [place] [address] Phone: [phone]; Fax: [phone] 03/05/2016 12:50 PM

## 2016-03-08 DIAGNOSIS — D631 Anemia in chronic kidney disease: Secondary | ICD-10-CM | POA: Diagnosis not present

## 2016-03-08 DIAGNOSIS — E875 Hyperkalemia: Secondary | ICD-10-CM | POA: Diagnosis not present

## 2016-03-08 DIAGNOSIS — N186 End stage renal disease: Secondary | ICD-10-CM | POA: Diagnosis not present

## 2016-03-08 DIAGNOSIS — N2581 Secondary hyperparathyroidism of renal origin: Secondary | ICD-10-CM | POA: Diagnosis not present

## 2016-03-08 DIAGNOSIS — D509 Iron deficiency anemia, unspecified: Secondary | ICD-10-CM | POA: Diagnosis not present

## 2016-03-11 ENCOUNTER — Ambulatory Visit: Payer: Medicare Other | Admitting: Cardiology

## 2016-03-24 ENCOUNTER — Encounter (HOSPITAL_COMMUNITY): Payer: Self-pay | Admitting: Emergency Medicine

## 2016-03-24 ENCOUNTER — Telehealth: Payer: Self-pay | Admitting: Cardiovascular Disease

## 2016-03-24 ENCOUNTER — Observation Stay (HOSPITAL_COMMUNITY)
Admission: EM | Admit: 2016-03-24 | Discharge: 2016-03-27 | Disposition: A | Discharge status: Home / Self Care | Payer: Medicare Other | Attending: Internal Medicine | Admitting: Internal Medicine

## 2016-03-24 ENCOUNTER — Emergency Department (HOSPITAL_COMMUNITY): Payer: Medicare Other

## 2016-03-24 DIAGNOSIS — D649 Anemia, unspecified: Secondary | ICD-10-CM | POA: Diagnosis present

## 2016-03-24 DIAGNOSIS — Z9119 Patient's noncompliance with other medical treatment and regimen: Secondary | ICD-10-CM | POA: Insufficient documentation

## 2016-03-24 DIAGNOSIS — G4733 Obstructive sleep apnea (adult) (pediatric): Secondary | ICD-10-CM | POA: Diagnosis not present

## 2016-03-24 DIAGNOSIS — J449 Chronic obstructive pulmonary disease, unspecified: Secondary | ICD-10-CM | POA: Diagnosis present

## 2016-03-24 DIAGNOSIS — Z955 Presence of coronary angioplasty implant and graft: Secondary | ICD-10-CM | POA: Diagnosis not present

## 2016-03-24 DIAGNOSIS — Z9889 Other specified postprocedural states: Secondary | ICD-10-CM | POA: Insufficient documentation

## 2016-03-24 DIAGNOSIS — I10 Essential (primary) hypertension: Secondary | ICD-10-CM | POA: Diagnosis present

## 2016-03-24 DIAGNOSIS — Z951 Presence of aortocoronary bypass graft: Secondary | ICD-10-CM | POA: Diagnosis not present

## 2016-03-24 DIAGNOSIS — J439 Emphysema, unspecified: Secondary | ICD-10-CM | POA: Insufficient documentation

## 2016-03-24 DIAGNOSIS — D638 Anemia in other chronic diseases classified elsewhere: Secondary | ICD-10-CM | POA: Diagnosis not present

## 2016-03-24 DIAGNOSIS — Z7902 Long term (current) use of antithrombotics/antiplatelets: Secondary | ICD-10-CM | POA: Insufficient documentation

## 2016-03-24 DIAGNOSIS — Z940 Kidney transplant status: Secondary | ICD-10-CM | POA: Insufficient documentation

## 2016-03-24 DIAGNOSIS — Z9981 Dependence on supplemental oxygen: Secondary | ICD-10-CM | POA: Insufficient documentation

## 2016-03-24 DIAGNOSIS — I5033 Acute on chronic diastolic (congestive) heart failure: Secondary | ICD-10-CM | POA: Insufficient documentation

## 2016-03-24 DIAGNOSIS — M7989 Other specified soft tissue disorders: Secondary | ICD-10-CM | POA: Insufficient documentation

## 2016-03-24 DIAGNOSIS — Z7682 Awaiting organ transplant status: Secondary | ICD-10-CM | POA: Insufficient documentation

## 2016-03-24 DIAGNOSIS — I252 Old myocardial infarction: Secondary | ICD-10-CM | POA: Insufficient documentation

## 2016-03-24 DIAGNOSIS — Z953 Presence of xenogenic heart valve: Secondary | ICD-10-CM | POA: Insufficient documentation

## 2016-03-24 DIAGNOSIS — Z992 Dependence on renal dialysis: Secondary | ICD-10-CM | POA: Diagnosis not present

## 2016-03-24 DIAGNOSIS — I132 Hypertensive heart and chronic kidney disease with heart failure and with stage 5 chronic kidney disease, or end stage renal disease: Principal | ICD-10-CM | POA: Insufficient documentation

## 2016-03-24 DIAGNOSIS — Z7901 Long term (current) use of anticoagulants: Secondary | ICD-10-CM | POA: Diagnosis not present

## 2016-03-24 DIAGNOSIS — E785 Hyperlipidemia, unspecified: Secondary | ICD-10-CM | POA: Insufficient documentation

## 2016-03-24 DIAGNOSIS — I480 Paroxysmal atrial fibrillation: Secondary | ICD-10-CM | POA: Diagnosis present

## 2016-03-24 DIAGNOSIS — G35 Multiple sclerosis: Secondary | ICD-10-CM | POA: Diagnosis present

## 2016-03-24 DIAGNOSIS — Z880 Allergy status to penicillin: Secondary | ICD-10-CM | POA: Diagnosis not present

## 2016-03-24 DIAGNOSIS — I2510 Atherosclerotic heart disease of native coronary artery without angina pectoris: Secondary | ICD-10-CM | POA: Diagnosis not present

## 2016-03-24 DIAGNOSIS — R7989 Other specified abnormal findings of blood chemistry: Secondary | ICD-10-CM | POA: Diagnosis not present

## 2016-03-24 DIAGNOSIS — R0602 Shortness of breath: Secondary | ICD-10-CM | POA: Diagnosis present

## 2016-03-24 DIAGNOSIS — I5032 Chronic diastolic (congestive) heart failure: Secondary | ICD-10-CM | POA: Diagnosis present

## 2016-03-24 DIAGNOSIS — Z87891 Personal history of nicotine dependence: Secondary | ICD-10-CM | POA: Diagnosis not present

## 2016-03-24 DIAGNOSIS — N186 End stage renal disease: Secondary | ICD-10-CM | POA: Diagnosis not present

## 2016-03-24 DIAGNOSIS — D539 Nutritional anemia, unspecified: Secondary | ICD-10-CM | POA: Insufficient documentation

## 2016-03-24 DIAGNOSIS — R778 Other specified abnormalities of plasma proteins: Secondary | ICD-10-CM | POA: Diagnosis present

## 2016-03-24 DIAGNOSIS — Z9861 Coronary angioplasty status: Secondary | ICD-10-CM

## 2016-03-24 LAB — BASIC METABOLIC PANEL
ANION GAP: 9 (ref 5–15)
BUN: 26 mg/dL — AB (ref 6–20)
CALCIUM: 9.3 mg/dL (ref 8.9–10.3)
CO2: 31 mmol/L (ref 22–32)
CREATININE: 4.61 mg/dL — AB (ref 0.61–1.24)
Chloride: 97 mmol/L — ABNORMAL LOW (ref 101–111)
GFR calc Af Amer: 15 mL/min — ABNORMAL LOW (ref >60)
GFR, EST NON AFRICAN AMERICAN: 13 mL/min — AB (ref >60)
GLUCOSE: 92 mg/dL (ref 65–99)
Potassium: 5 mmol/L (ref 3.5–5.1)
Sodium: 137 mmol/L (ref 135–145)

## 2016-03-24 LAB — TROPONIN I
TROPONIN I: 0.07 ng/mL — AB (ref <0.03)
Troponin I: 0.07 ng/mL (ref <0.03)

## 2016-03-24 LAB — HEPATIC FUNCTION PANEL
ALT: 24 U/L (ref 17–63)
AST: 30 U/L (ref 15–41)
Albumin: 3.4 g/dL — ABNORMAL LOW (ref 3.5–5.0)
Alkaline Phosphatase: 78 U/L (ref 38–126)
BILIRUBIN DIRECT: 0.2 mg/dL (ref 0.1–0.5)
BILIRUBIN INDIRECT: 0.5 mg/dL (ref 0.3–0.9)
BILIRUBIN TOTAL: 0.7 mg/dL (ref 0.3–1.2)
Total Protein: 7.4 g/dL (ref 6.5–8.1)

## 2016-03-24 LAB — CBC
HCT: 33.6 % — ABNORMAL LOW (ref 39.0–52.0)
Hemoglobin: 10.5 g/dL — ABNORMAL LOW (ref 13.0–17.0)
MCH: 31.4 pg (ref 26.0–34.0)
MCHC: 31.3 g/dL (ref 30.0–36.0)
MCV: 100.6 fL — ABNORMAL HIGH (ref 78.0–100.0)
Platelets: 157 10*3/uL (ref 150–400)
RBC: 3.34 MIL/uL — ABNORMAL LOW (ref 4.22–5.81)
RDW: 15.8 % — AB (ref 11.5–15.5)
WBC: 5.1 10*3/uL (ref 4.0–10.5)

## 2016-03-24 LAB — PROTIME-INR
INR: 2.15 — AB (ref 0.00–1.49)
Prothrombin Time: 23.8 seconds — ABNORMAL HIGH (ref 11.6–15.2)

## 2016-03-24 LAB — BRAIN NATRIURETIC PEPTIDE: B NATRIURETIC PEPTIDE 5: 1785.3 pg/mL — AB (ref 0.0–100.0)

## 2016-03-24 MED ORDER — FOLIC ACID 1 MG PO TABS
1.0000 mg | ORAL_TABLET | Freq: Every day | ORAL | Status: DC
  Administered 2016-03-25 – 2016-03-27 (×3): 1 mg via ORAL
  Filled 2016-03-24 (×3): qty 1

## 2016-03-24 MED ORDER — IPRATROPIUM-ALBUTEROL 0.5-2.5 (3) MG/3ML IN SOLN: 3.0000 mL | RESPIRATORY_TRACT | Status: DC | PRN

## 2016-03-24 MED ORDER — GABAPENTIN 300 MG PO CAPS
300.0000 mg | ORAL_CAPSULE | Freq: Every day | ORAL | Status: DC
Start: 2016-03-24 — End: 2016-03-27
  Administered 2016-03-24 – 2016-03-26 (×3): 300 mg via ORAL
  Filled 2016-03-24 (×3): qty 1

## 2016-03-24 MED ORDER — NITROGLYCERIN 0.4 MG SL SUBL: 0.4000 mg | SUBLINGUAL_TABLET | SUBLINGUAL | Status: DC | PRN

## 2016-03-24 MED ORDER — CALCIUM ACETATE (PHOS BINDER) 667 MG PO CAPS
4002.0000 mg | ORAL_CAPSULE | Freq: Three times a day (TID) | ORAL | Status: DC
  Administered 2016-03-25 – 2016-03-27 (×7): 4002 mg via ORAL
  Filled 2016-03-24 (×7): qty 6

## 2016-03-24 MED ORDER — DOCUSATE SODIUM 100 MG PO CAPS
300.0000 mg | ORAL_CAPSULE | Freq: Two times a day (BID) | ORAL | Status: DC
  Administered 2016-03-24 – 2016-03-27 (×5): 300 mg via ORAL
  Filled 2016-03-24 (×5): qty 3

## 2016-03-24 MED ORDER — ATORVASTATIN CALCIUM 20 MG PO TABS
20.0000 mg | ORAL_TABLET | Freq: Every day | ORAL | Status: DC
  Administered 2016-03-25 – 2016-03-26 (×2): 20 mg via ORAL
  Filled 2016-03-24 (×2): qty 1

## 2016-03-24 MED ORDER — FAMOTIDINE 20 MG PO TABS
20.0000 mg | ORAL_TABLET | Freq: Every day | ORAL | Status: DC
  Administered 2016-03-25 – 2016-03-27 (×3): 20 mg via ORAL
  Filled 2016-03-24 (×3): qty 1

## 2016-03-24 MED ORDER — ONDANSETRON HCL 4 MG/2ML IJ SOLN: 4.0000 mg | Freq: Four times a day (QID) | INTRAMUSCULAR | Status: DC | PRN

## 2016-03-24 MED ORDER — HYDRALAZINE HCL 20 MG/ML IJ SOLN: 10.0000 mg | INTRAMUSCULAR | Status: DC | PRN

## 2016-03-24 MED ORDER — ACETAMINOPHEN 325 MG PO TABS: 650.0000 mg | ORAL_TABLET | ORAL | Status: DC | PRN

## 2016-03-24 MED ORDER — CITALOPRAM HYDROBROMIDE 20 MG PO TABS
20.0000 mg | ORAL_TABLET | Freq: Every day | ORAL | Status: DC
  Administered 2016-03-24 – 2016-03-26 (×3): 20 mg via ORAL
  Filled 2016-03-24 (×3): qty 1

## 2016-03-24 MED ORDER — METOPROLOL TARTRATE 25 MG PO TABS
25.0000 mg | ORAL_TABLET | Freq: Two times a day (BID) | ORAL | Status: DC
  Administered 2016-03-24 – 2016-03-27 (×5): 25 mg via ORAL
  Filled 2016-03-24 (×7): qty 1

## 2016-03-24 MED ORDER — WARFARIN SODIUM 7.5 MG PO TABS: 3.7500 mg | ORAL_TABLET | ORAL | Status: DC

## 2016-03-24 MED ORDER — CLOPIDOGREL BISULFATE 75 MG PO TABS
75.0000 mg | ORAL_TABLET | Freq: Every day | ORAL | Status: DC
  Administered 2016-03-25 – 2016-03-27 (×3): 75 mg via ORAL
  Filled 2016-03-24 (×3): qty 1

## 2016-03-24 MED ORDER — SODIUM CHLORIDE 0.9% FLUSH: 3.0000 mL | INTRAVENOUS | Status: DC | PRN

## 2016-03-24 MED ORDER — WARFARIN SODIUM 7.5 MG PO TABS
7.5000 mg | ORAL_TABLET | ORAL | Status: DC
  Administered 2016-03-25 – 2016-03-26 (×3): 7.5 mg via ORAL
  Filled 2016-03-24 (×4): qty 1

## 2016-03-24 MED ORDER — RENA-VITE PO TABS
1.0000 | ORAL_TABLET | Freq: Every day | ORAL | Status: DC
  Administered 2016-03-25 – 2016-03-27 (×3): 1 via ORAL
  Filled 2016-03-24 (×3): qty 1

## 2016-03-24 MED ORDER — WARFARIN - PHARMACIST DOSING INPATIENT
Freq: Every day | Status: DC
  Administered 2016-03-25: 18:00:00

## 2016-03-24 MED ORDER — SODIUM CHLORIDE 0.9% FLUSH
3.0000 mL | Freq: Two times a day (BID) | INTRAVENOUS | Status: DC
  Administered 2016-03-24 – 2016-03-27 (×6): 3 mL via INTRAVENOUS

## 2016-03-24 MED ORDER — SODIUM CHLORIDE 0.9 % IV SOLN: 250.0000 mL | INTRAVENOUS | Status: DC | PRN

## 2016-03-24 NOTE — Telephone Encounter (Signed)
Reviewed with Truitt Merle, NP who recommends pt go to ED for evaluation.  I spoke with pt's wife and gave her these instructions.

## 2016-03-24 NOTE — ED Notes (Signed)
Pt states that he does not urinate.

## 2016-03-24 NOTE — ED Notes (Signed)
Called lab to add on hepatic function panel, bnp and troponin I.

## 2016-03-24 NOTE — ED Notes (Signed)
Pt here with worsening exertional SOB for a week. Pt is dialysis pt and had a full treatment today. Pt speaking in full sentences.Pt denies fever/cough. Pt also denies CP.

## 2016-03-24 NOTE — ED Provider Notes (Signed)
CSN: [id number]     Arrival date & time 03/24/16  1520 History   First MD Initiated Contact with Patient 03/24/16 1654     Chief Complaint  Patient presents with  . Shortness of Breath     (Consider location/radiation/quality/duration/timing/severity/associated sxs/prior Treatment) Patient is a 60 y.o. male presenting with shortness of breath. The history is provided by the patient (Patient complains of shortness of breath for a number weeks. He had dialysis today.).  Shortness of Breath Severity:  Moderate Onset quality:  Gradual Timing:  Constant Progression:  Waxing and waning Chronicity:  New Context: activity   Associated symptoms: no abdominal pain, no chest pain, no cough, no headaches and no rash     Past Medical History  Diagnosis Date  . 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 04/2013: s/p CABG x 2 (Y SVG -LAD & D2). b. 01/2015: NSTEMI s/p DES to LCx, BMS to SVG-LAD. c. NSTEMI 05/2015 during AF/AFL - non-flow limiting FFR. d. Low risk nuc 3/17. e. STEMI 6/17 after coming off Plavix, s/p DES to SVG-LAD into native vessel.  . Aortic heart valve prolapse 04/2013    a. s/p bioprosthetic AVR at time of CABG.  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 Peripheral vascular disease ([place])     Cath in 01/2015 required 45 cm Destination [place]  . Sleep apnea     a. intolerant of bipap.  Marland Kitchen PAF (paroxysmal atrial fibrillation) ([place])   . Multiple sclerosis ([place])   . Renal transplant failure and rejection   . 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 b. He had a renal Tx from 2008 to 2011, but subsequent rejection - Gets HD in [place], [place] on MWF schedule.    Marland Kitchen Hypertension   . Multiple sclerosis ([place])   . SVT (supraventricular tachycardia) ([place])   . Paroxysmal atrial flutter ([place])     a. During 05/2015 admission - SVT, atrial flutter, and PAF.  Marland Kitchen Anemia   .  Valvular heart disease     a. 2D echo 05/2015: EF 55-60%, no RWMA, mild AS/mild AI, mod MS, severely dilated LA.  . Carotid artery disease ([place])     a. 1-39% stenosis bilaterally in 06/2015.   . On home oxygen therapy     pt states he has not been wearing it  . Hematoma     a. Large right groin hematoma after cath 02/2016 with associated ABL anemia.  . Acute blood loss anemia     a. 02/2016 due to groin hematoma. Rec 3 U PRBC.  . Bifascicular block   . Nocturnal hypoxemia     a. Pt has home o2 QHS.   Past Surgical History  Procedure Laterality Date  . Av fistula placement    . Flash      flash pulmonary edema  . Kidney transplant  08/2007  . Hernia repair  07/2011  . Excised squamous cells at rectum  2006  . Left heart catheterization with coronary angiogram N/A 01/09/2013    Procedure: LEFT HEART CATHETERIZATION WITH CORONARY ANGIOGRAM;  Surgeon: Burnell Blanks, MD;  Location: [facility] CATH LAB;  Service: Cardiovascular;  Laterality: N/A;  . Cardiac catheterization N/A 05/26/2015    Procedure: Left Heart Cath and Coronary Angiography;  Surgeon: Leonie Man, MD;  Location: [facility]  INVASIVE CV LAB;  Service: Cardiovascular;  Laterality: N/A;  . Cardiac catheterization N/A 05/26/2015    Procedure: Intravascular Pressure Wire/FFR Study;  Surgeon: Leonie Man, MD;  Location: [place] CV LAB;  Service: Cardiovascular;  Laterality: N/A;  . Cardiac catheterization N/A 02/15/2016    Procedure: Left Heart Cath and Coronary Angiography;  Surgeon: Wellington Hampshire, MD;  Location: [place] CV LAB;  Service: Cardiovascular;  Laterality: N/A;   Family History  Problem Relation Age of Onset  . Diabetes Father   . Heart failure Mother     started in 75s  . Lupus Sister   . Heart attack Maternal Grandfather   . Heart attack Maternal Uncle   . Heart attack Maternal Aunt   . Hypertension Maternal Aunt   . Stroke Neg Hx    Social History  Substance Use Topics  . Smoking status: Former  Smoker -- 2.00 packs/day for 20 years    Types: Cigarettes    Quit date: 08/06/1998  . Smokeless tobacco: None  . Alcohol Use: No    Review of Systems  Constitutional: Negative for appetite change and fatigue.  HENT: Negative for congestion, ear discharge and sinus pressure.   Eyes: Negative for discharge.  Respiratory: Positive for shortness of breath. Negative for cough.   Cardiovascular: Negative for chest pain.  Gastrointestinal: Negative for abdominal pain and diarrhea.  Genitourinary: Negative for frequency and hematuria.  Musculoskeletal: Negative for back pain.  Skin: Negative for rash.  Neurological: Negative for seizures and headaches.  Psychiatric/Behavioral: Negative for hallucinations.      Allergies  Diphenhydramine; Penicillins; Adhesive; Amoxicillin; Other; and Penicillin g  Home Medications   Prior to Admission medications   Medication Sig Start Date End Date Taking? Authorizing Provider  acetaminophen (TYLENOL) 500 MG tablet Take 1,000 mg by mouth every 6 (six) hours as needed for pain or fever.   Yes Historical Provider, MD  atorvastatin (LIPITOR) 40 MG tablet Take 1 tablet (40 mg total) by mouth every evening. Patient taking differently: Take 20 mg by mouth daily at 6 PM.  02/19/16  Yes Dayna N Dunn, PA-C  calcium acetate (PHOSLO) 667 MG capsule Take 4,002 mg by mouth 3 (three) times daily with meals.    Yes Historical Provider, MD  citalopram (CELEXA) 20 MG tablet Take 20 mg by mouth at bedtime.    Yes Historical Provider, MD  clopidogrel (PLAVIX) 75 MG tablet Take 1 tablet (75 mg total) by mouth daily. 02/19/16  Yes Dayna N Dunn, PA-C  docusate sodium (COLACE) 100 MG capsule Take 300 mg by mouth 2 (two) times daily.   Yes Historical Provider, MD  folic acid (FOLVITE) 1 MG tablet Take 1 mg by mouth daily.   Yes Historical Provider, MD  gabapentin (NEURONTIN) 300 MG capsule Take 300 mg by mouth at bedtime.    Yes Historical Provider, MD  metoprolol tartrate  (LOPRESSOR) 25 MG tablet Take 1 tablet (25 mg total) by mouth 2 (two) times daily. 03/05/16  Yes Burnell Blanks, MD  multivitamin (RENA-VIT) TABS tablet Take 1 tablet by mouth daily. 01/11/13  Yes Samella Parr, NP  nitroGLYCERIN (NITROSTAT) 0.4 MG SL tablet Place 0.4 mg under the tongue every 5 (five) minutes as needed for chest pain (Take one tabe every 5-10 minutes for chest pain. Once you have taken three tabs you need to notify your MD or go to ER if chest pain not gone.).   Yes Historical Provider, MD  ranitidine (ZANTAC) 150  MG tablet Take 150 mg by mouth daily.   Yes Historical Provider, MD  sodium polystyrene (KAYEXALATE) 15 GM/60ML suspension Take 15 g by mouth See admin instructions. Only take 15 g on Sun / Tues / Thurs / Sat (non dialysis days)   Yes Historical Provider, MD  warfarin (COUMADIN) 7.5 MG tablet Take 3.75-7.5 mg by mouth as directed. Takes 1/2 tab on Tues, Sat 3.'75mg'$  Takes 1 tab all other days   Yes Historical Provider, MD  albuterol (PROAIR HFA) 108 (90 BASE) MCG/ACT inhaler Inhale 1-2 puffs into the lungs every 6 (six) hours as needed for shortness of breath.  07/19/11   Historical Provider, MD   BP 143/90 mmHg  Pulse 59  Temp(Src) 98 F (36.7 C) (Oral)  Resp 18  Ht '5\' 10"'$  (1.778 m)  Wt 185 lb (83.915 kg)  BMI 26.54 kg/m2  SpO2 94% Physical Exam  Constitutional: He is oriented to person, place, and time. He appears well-developed.  HENT:  Head: Normocephalic.  Eyes: Conjunctivae and EOM are normal. No scleral icterus.  Neck: Neck supple. No thyromegaly present.  Cardiovascular: Normal rate and regular rhythm.  Exam reveals no gallop and no friction rub.   No murmur heard. Pulmonary/Chest: No stridor. He has no wheezes. He has no rales. He exhibits no tenderness.  Abdominal: He exhibits no distension. There is no tenderness. There is no rebound.  Musculoskeletal: Normal range of motion. He exhibits no edema.  Lymphadenopathy:    He has no cervical  adenopathy.  Neurological: He is oriented to person, place, and time. He exhibits normal muscle tone. Coordination normal.  Skin: No rash noted. No erythema.  Psychiatric: He has a normal mood and affect. His behavior is normal.    ED Course  Procedures (including critical care time) Labs Review Labs Reviewed  BASIC METABOLIC PANEL - Abnormal; Notable for the following:    Chloride 97 (*)    BUN 26 (*)    Creatinine, Ser 4.61 (*)    GFR calc non Af Amer 13 (*)    GFR calc Af Amer 15 (*)    All other components within normal limits  CBC - Abnormal; Notable for the following:    RBC 3.34 (*)    Hemoglobin 10.5 (*)    HCT 33.6 (*)    MCV 100.6 (*)    RDW 15.8 (*)    All other components within normal limits  TROPONIN I - Abnormal; Notable for the following:    Troponin I 0.07 (*)    All other components within normal limits  BRAIN NATRIURETIC PEPTIDE - Abnormal; Notable for the following:    B Natriuretic Peptide 1785.3 (*)    All other components within normal limits  HEPATIC FUNCTION PANEL - Abnormal; Notable for the following:    Albumin 3.4 (*)    All other components within normal limits  PROTIME-INR - Abnormal; Notable for the following:    Prothrombin Time 23.8 (*)    INR 2.15 (*)    All other components within normal limits    Imaging Review Dg Chest 2 View  03/24/2016  CLINICAL DATA:  Shortness of breath with exertion for 2 weeks. Previous coronary bypass. Recent coronary stent 1 month ago. EXAM: CHEST  2 VIEW COMPARISON:  02/15/2016 FINDINGS: Previous median sternotomy. Upper sternotomy wires are broken with a similar appearance. cardiomegaly with central vascular congestion. No definite CHF pattern or focal pneumonia. Mild background COPD/ emphysema pattern with parenchymal scarring, similar to prior study. No  effusion or pneumothorax. Trachea midline. Previous aortic valve replacement present. 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  Shick M.D.   On: 03/24/2016 16:38   I have personally reviewed and evaluated these images and lab results as part of my medical decision-making.   EKG Interpretation   Date/Time:  Wednesday March 24 2016 15:27:39 EDT Ventricular Rate:  64 PR Interval:  206 QRS Duration: 100 QT Interval:  406 QTC Calculation: 418 R Axis:   71 Text Interpretation:  Normal sinus rhythm Low voltage QRS Incomplete right  bundle branch block Cannot rule out Anteroseptal infarct , age  undetermined Abnormal ECG Confirmed by Armoni Kludt  MD, Kiandria Clum [id number]) on  03/24/2016 5:28:11 PM      MDM   Final diagnoses:  SOB (shortness of breath)    Patient with congestive heart failure and shortness of breath. I spoke with cardiology who recommends medicine admission    Milton Ferguson, MD 03/24/16 2051

## 2016-03-24 NOTE — Telephone Encounter (Signed)
Spoke with pt's wife. Pt in background. She reports pt has shortness of breath with any exertion. Is OK at rest but develops shortness of breath when walking outside to deck.  Sunday was worst day and he had to use his inhaler. For last 2 weeks has been fatigued and wife reports "he doesn't feel good".  No chest pain. Last week at dialysis had episode of atrial fib and took extra metoprolol. Does not think he has had another episode of Afib since then. Dry weight was changed at dialysis last week to 82.5 kg. Was previously 83 kg.  Before dialysis today he weighed 86.3 kg and after dialysis today he weighed 82.7 kg.  Wife reports pt also has dizziness.  BP today is 129/70 and heart rate 65-72.  He is using oxygen at night.

## 2016-03-24 NOTE — H&P (Signed)
History and Physical    [name] QIH:[id number] DOB: 02-Jan-1956 DOA: 03/24/2016  PCP: Teressa Lower, MD   Patient coming from: Home   Chief Complaint: Exertional dyspnea   HPI: [name] is a 60 y.o. male with medical history significant for end-stage renal disease with failed transplant now on HD, paroxysmal atrial fibrillation, chronic diastolic CHF, aortic disease status post AVR, and CAD with STEMI last month attributed to acute occlusion of SVG-LAD treated with DES, now presenting to the emergency department for evaluation of exertional dyspnea of 2 weeks' duration. Patient was being considered for a second renal transplant, but the transplant team requested that he be taken off Plavix prior to this. Shortly after the patient was taken off Plavix, he unfortunately developed acute chest pain with radiation into the jaw and teeth and was found to have STEMI managed with DES. He was discharge back on Plavix in addition to aspirin and warfarin which she had been maintained on. Aspirin was discontinued 2 weeks later and he now continues warfarin and Plavix. He had initially been doing quite well after the hospitalization, but over the past 2 weeks, he has noted the insidious development of exertional dyspnea and some malaise. Patient denies any significant cough, chest pain, or palpitations. He denies fevers or chills. He reports swelling of the left lower extremity, but states that this has been a chronic problem. The patient completed his regularly scheduled dialysis session today, with UF briefly held for hypotension before completing the session.    ED Course: Upon arrival to the ED, patient is found to be afebrile, saturating low 90s on room air, and with vitals otherwise stable. EKG demonstrates a normal sinus rhythm with low-voltage QRS and incomplete right bundle branch block. Chest x-ray demonstrates stable cardiomegaly with vascular congestion on a background of chronic emphysema and  parenchymal scarring. Chemistry panel features a BUN of 26 and serum creatinine of 4.61. CBC is notable for a hemoglobin of 10.5 with MCV 100.6. INR is therapeutic at 2.15, troponin is elevated mildly to 0.07, and there is a marked BNP elevation 1785. ED physician discussed the case with cardiology who advised admission to the medicine service with consultation to be re-requested in the morning as needed. O2 saturations hovered in the 90-93% range in the emergency department on room air and he was placed on 2 L/m supplemental oxygen. He remained hemodynamically stable in the ED and will be observed on the telemetry unit for ongoing evaluation and management of exertional dyspnea suspected to be multifactorial with contributions from acute on chronic diastolic CHF and emphysema, with ACS less likely.  Review of Systems:  All other systems reviewed and apart from HPI, are negative.  Past Medical History  Diagnosis Date  . 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 04/2013: s/p CABG x 2 (Y SVG -LAD & D2). b. 01/2015: NSTEMI s/p DES to LCx, BMS to SVG-LAD. c. NSTEMI 05/2015 during AF/AFL - non-flow limiting FFR. d. Low risk nuc 3/17. e. STEMI 6/17 after coming off Plavix, s/p DES to SVG-LAD into native vessel.  . Aortic heart valve prolapse 04/2013    a. s/p bioprosthetic AVR at time of CABG.  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 Peripheral vascular disease ([place])     Cath in 01/2015 required 45 cm Destination Sheath  . Sleep apnea     a.  intolerant of bipap.  Marland Kitchen PAF (paroxysmal atrial fibrillation) ([place])   . Multiple sclerosis ([place])   . Renal transplant failure and rejection   . 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 b. He had a renal Tx from 2008 to 2011, but subsequent rejection - Gets HD in [place], [place] on MWF schedule.    Marland Kitchen Hypertension   . Multiple sclerosis ([place])   . SVT  (supraventricular tachycardia) ([place])   . Paroxysmal atrial flutter ([place])     a. During 05/2015 admission - SVT, atrial flutter, and PAF.  Marland Kitchen Anemia   . Valvular heart disease     a. 2D echo 05/2015: EF 55-60%, no RWMA, mild AS/mild AI, mod MS, severely dilated LA.  . Carotid artery disease ([place])     a. 1-39% stenosis bilaterally in 06/2015.   . On home oxygen therapy     pt states he has not been wearing it  . Hematoma     a. Large right groin hematoma after cath 02/2016 with associated ABL anemia.  . Acute blood loss anemia     a. 02/2016 due to groin hematoma. Rec 3 U PRBC.  . Bifascicular block   . Nocturnal hypoxemia     a. Pt has home o2 QHS.    Past Surgical History  Procedure Laterality Date  . Av fistula placement    . Flash      flash pulmonary edema  . Kidney transplant  08/2007  . Hernia repair  07/2011  . Excised squamous cells at rectum  2006  . Left heart catheterization with coronary angiogram N/A 01/09/2013    Procedure: LEFT HEART CATHETERIZATION WITH CORONARY ANGIOGRAM;  Surgeon: Burnell Blanks, MD;  Location: [facility] CATH LAB;  Service: Cardiovascular;  Laterality: N/A;  . Cardiac catheterization N/A 05/26/2015    Procedure: Left Heart Cath and Coronary Angiography;  Surgeon: Leonie Man, MD;  Location: [facility] CV LAB;  Service: Cardiovascular;  Laterality: N/A;  . Cardiac catheterization N/A 05/26/2015    Procedure: Intravascular Pressure Wire/FFR Study;  Surgeon: Leonie Man, MD;  Location: [facility] CV LAB;  Service: Cardiovascular;  Laterality: N/A;  . Cardiac catheterization N/A 02/15/2016    Procedure: Left Heart Cath and Coronary Angiography;  Surgeon: Wellington Hampshire, MD;  Location: [facility] CV LAB;  Service: Cardiovascular;  Laterality: N/A;     reports that he quit smoking about 17 years ago. His smoking use included Cigarettes. He has a 40 pack-year smoking history. He does not have any smokeless tobacco history on file. He reports that he  does not drink alcohol or use illicit drugs.  Allergies  Allergen Reactions  . Diphenhydramine Itching    Only with IV doses. Tolerates oral.  . Penicillins Other (See Comments)    migraine  . Adhesive [Tape] Rash    Please use paper tape  . Amoxicillin Rash    migraine  . Other Rash    Please use paper tape  . Penicillin G Rash    headache    Family History  Problem Relation Age of Onset  . Diabetes Father   . Heart failure Mother     started in 40s  . Lupus Sister   . Heart attack Maternal Grandfather   . Heart attack Maternal Uncle   . Heart attack Maternal Aunt   . Hypertension Maternal Aunt   . Stroke Neg Hx  Prior to Admission medications   Medication Sig Start Date End Date Taking? Authorizing Provider  acetaminophen (TYLENOL) 500 MG tablet Take 1,000 mg by mouth every 6 (six) hours as needed for pain or fever.   Yes Historical Provider, MD  atorvastatin (LIPITOR) 40 MG tablet Take 1 tablet (40 mg total) by mouth every evening. Patient taking differently: Take 20 mg by mouth daily at 6 PM.  02/19/16  Yes Dayna N Dunn, PA-C  calcium acetate (PHOSLO) 667 MG capsule Take 4,002 mg by mouth 3 (three) times daily with meals.    Yes Historical Provider, MD  citalopram (CELEXA) 20 MG tablet Take 20 mg by mouth at bedtime.    Yes Historical Provider, MD  clopidogrel (PLAVIX) 75 MG tablet Take 1 tablet (75 mg total) by mouth daily. 02/19/16  Yes Dayna N Dunn, PA-C  docusate sodium (COLACE) 100 MG capsule Take 300 mg by mouth 2 (two) times daily.   Yes Historical Provider, MD  folic acid (FOLVITE) 1 MG tablet Take 1 mg by mouth daily.   Yes Historical Provider, MD  gabapentin (NEURONTIN) 300 MG capsule Take 300 mg by mouth at bedtime.    Yes Historical Provider, MD  metoprolol tartrate (LOPRESSOR) 25 MG tablet Take 1 tablet (25 mg total) by mouth 2 (two) times daily. 03/05/16  Yes Burnell Blanks, MD  multivitamin (RENA-VIT) TABS tablet Take 1 tablet by mouth daily.  01/11/13  Yes Samella Parr, NP  nitroGLYCERIN (NITROSTAT) 0.4 MG SL tablet Place 0.4 mg under the tongue every 5 (five) minutes as needed for chest pain (Take one tabe every 5-10 minutes for chest pain. Once you have taken three tabs you need to notify your MD or go to ER if chest pain not gone.).   Yes Historical Provider, MD  ranitidine (ZANTAC) 150 MG tablet Take 150 mg by mouth daily.   Yes Historical Provider, MD  sodium polystyrene (KAYEXALATE) 15 GM/60ML suspension Take 15 g by mouth See admin instructions. Only take 15 g on Sun / Tues / Thurs / Sat (non dialysis days)   Yes Historical Provider, MD  warfarin (COUMADIN) 7.5 MG tablet Take 3.75-7.5 mg by mouth as directed. Takes 1/2 tab on Tues, Sat 3.'75mg'$  Takes 1 tab all other days   Yes Historical Provider, MD  albuterol (PROAIR HFA) 108 (90 BASE) MCG/ACT inhaler Inhale 1-2 puffs into the lungs every 6 (six) hours as needed for shortness of breath.  07/19/11   Historical Provider, MD    Physical Exam: Filed Vitals:   03/24/16 2030 03/24/16 2045 03/24/16 2100 03/24/16 2115  BP: 136/84 126/75 147/83 146/88  Pulse: 69 67 78 72  Temp:      TempSrc:      Resp: '23 11 20 16  '$ Height:      Weight:      SpO2: 92% 92%  90%      Constitutional: NAD, calm, comfortable, eating a sandwich, chronically-ill appearing Eyes: PERTLA, lids and conjunctivae normal ENMT: Mucous membranes are moist. Posterior pharynx clear of any exudate or lesions.   Neck: normal, supple, no masses, no thyromegaly Respiratory: Scattered wheezes, no crackles. Normal respiratory effort.   Cardiovascular: S1 & S2 heard, regular rate and rhythm, soft systolic murmurs throughout precordium. 2+ edema and hyperpigmentation to LLE.  Abdomen: No distension, no tenderness, Large excisional hernias are non-tender, not warm or red. Bowel sounds normal.  Musculoskeletal: no clubbing / cyanosis. No joint deformity upper and lower extremities. Normal muscle tone.  Skin:  no  significant rashes, lesions, ulcers. Warm, dry, well-perfused. Neurologic: CN 2-12 grossly intact. Sensation intact, DTR normal. Strength 5/5 in all 4 limbs.  Psychiatric: Normal judgment and insight. Alert and oriented x 3. Normal mood and affect.     Labs on Admission: I have personally reviewed following labs and imaging studies  CBC:  Recent Labs Lab 03/24/16 1526  WBC 5.1  HGB 10.5*  HCT 33.6*  MCV 100.6*  PLT 119   Basic Metabolic Panel:  Recent Labs Lab 03/24/16 1526  NA 137  K 5.0  CL 97*  CO2 31  GLUCOSE 92  BUN 26*  CREATININE 4.61*  CALCIUM 9.3   GFR: Estimated Creatinine Clearance: 17.6 mL/min (by C-G formula based on Cr of 4.61). Liver Function Tests:  Recent Labs Lab 03/24/16 1526  AST 30  ALT 24  ALKPHOS 78  BILITOT 0.7  PROT 7.4  ALBUMIN 3.4*   No results for input(s): LIPASE, AMYLASE in the last 168 hours. No results for input(s): AMMONIA in the last 168 hours. Coagulation Profile:  Recent Labs Lab 03/24/16 1858  INR 2.15*   Cardiac Enzymes:  Recent Labs Lab 03/24/16 1526  TROPONINI 0.07*   BNP (last 3 results) No results for input(s): PROBNP in the last 8760 hours. HbA1C: No results for input(s): HGBA1C in the last 72 hours. CBG: No results for input(s): GLUCAP in the last 168 hours. Lipid Profile: No results for input(s): CHOL, HDL, LDLCALC, TRIG, CHOLHDL, LDLDIRECT in the last 72 hours. Thyroid Function Tests: No results for input(s): TSH, T4TOTAL, FREET4, T3FREE, THYROIDAB in the last 72 hours. Anemia Panel: No results for input(s): VITAMINB12, FOLATE, FERRITIN, TIBC, IRON, RETICCTPCT in the last 72 hours. Urine analysis: No results found for: COLORURINE, APPEARANCEUR, LABSPEC, PHURINE, GLUCOSEU, HGBUR, BILIRUBINUR, KETONESUR, PROTEINUR, UROBILINOGEN, NITRITE, LEUKOCYTESUR Sepsis Labs: '@LABRCNTIP'$ (procalcitonin:4,lacticidven:4) )No results found for this or any previous visit (from the past 240 hour(s)).    Radiological Exams on Admission: Dg Chest 2 View  03/24/2016  CLINICAL DATA:  Shortness of breath with exertion for 2 weeks. Previous coronary bypass. Recent coronary stent 1 month ago. EXAM: CHEST  2 VIEW COMPARISON:  02/15/2016 FINDINGS: Previous median sternotomy. Upper sternotomy wires are broken with a similar appearance. cardiomegaly with central vascular congestion. No definite CHF pattern or focal pneumonia. Mild background COPD/ emphysema pattern with parenchymal scarring, similar to prior study. No effusion or pneumothorax. Trachea midline. Previous aortic valve replacement present. IMPRESSION: Stable cardiomegaly with vascular congestion Chronic background COPD/ emphysema and parenchymal scarring Thoracic aortic atherosclerosis No superimposed definite acute process by plain radiography Electronically Signed   By: Jerilynn Mages.  Shick M.D.   On: 03/24/2016 16:38    EKG: Independently reviewed. NSR, low-voltage QRS, incomplete RBBB  Assessment/Plan  1. Exertional dyspnea  - Saturating low 90's on rm air at rest; CXR with vasc congestion on background of emphysema; BNP 1785, troponin 0.07  - Suspect this may be secondary to acute on chronic diastolic CHF, will managed as below  - ACS unlikely without CP or ischemic features on CXR; just had LHC last month; monitor on telemetry and trend troponin  - PE unlikely on coumadin and without tachycardia, CP, or cough; will eval LLE swelling with doppler  - Plan to repeat echo given recent MI and concern for acute on chronic CHF  - Given occasional wheeze on exam and hx of emphysema, will treat with DuoNebs; recommended systemic steroid as well, but will avoid this at pt request d/t prior bad experiences with mood  change and swelling  - Monitor on continuous pulse oximetry and titrate FiO2 to maintain sats >92%   2. Acute on chronic diastolic CHF  - BNP elevated to 1785 on arrival and CXR with vasc congestion  - TTE (02/15/16) with EF 55-60%, mild AR,  mild AS, mild MR, moderate LAE, hypokinesis of basal-midanteroseptal and apical-anterior myocardium   - Pt does not make urine; completed full HD session on day of admission - Nephrology will be asked to evaluate  - Repeat TTE given recent MI and concern for worsening CO  - SLIV, fluid-restrict diet, dialy wts, strict I/O's  - Continue Lopressor   3. COPD - Remote smoking history - Uses albuterol MDI occasionally, no scheduled treatments  - There may be a component of COPD exacerbation contributing to primary complaint  - Treat with DuoNeb q4h prn SOB or wheezing; pt asks to avoid systemic steroids d/t bad experiences in past  - Monitor on continuous pulse oximetry and titrate FiO2 to maintain sats >92%    4. ESRD on HD - Underwent renal transplant in 2008 that has failed  - Currently on transplant list while undergoing HD on MWF via LUE AVF  - Pt completed HD on day of admission  - No indication for emergent dialysis at time of admission, but may benefit from extra session for fluid removal  - Nephrology asked to evaluate    5. CAD with elevated troponin  - Status post CABG with STEMI in June 2017 after coming off Plavix  - There is no CP on admission and no acute ischemic features to initial EKG - Troponin mildly elevated to 0.07 on presentation, will trend   - Monitor on telemetry for ischemic changes  - Continue current-management with Lopressor, Lipitor, Plavix  - Repeat EKG in am, sooner for any angina    6. Atrial fibrillation, paroxysmal  - In sinus rhythm on admission   - CHADS-VASc is at least 3 (HTN, CHF, CAD) - Continue AC with warfarin, rate-control with Lopressor  - Monitoring on telemetry    7. Hypertension - BP mildly elevated in ED  - Continue current management with Lopressor  - Hydralazine IVP's prn    8. Macrocytic anemia  - Hgb appears to be stable at 10.5 with no s/s of active blood-loss  - MCV elevated to 100.6  - Continue folate supplementation     DVT prophylaxis: Continue warfarin  Code Status: Full  Family Communication: Wife updated at bedside  Disposition Plan: Observe on telemetry  Consults called: Cardiology discussed case with EDP  Admission status: Observation     Vianne Bulls, MD [facility] Pager [phone]  If 7PM-7AM, please contact night-coverage [email or web address] Password [facility]  03/24/2016, 9:27 PM

## 2016-03-24 NOTE — ED Notes (Signed)
Attempted to call report

## 2016-03-24 NOTE — ED Notes (Signed)
Pt states he had his full dialysis treatment this morning (Wednesday) and that he goes every Monday, Wednesday, Friday.

## 2016-03-24 NOTE — ED Notes (Signed)
Pt eating meal, admitting at bedside

## 2016-03-24 NOTE — Progress Notes (Signed)
ANTICOAGULATION CONSULT NOTE - Initial Consult  Pharmacy Consult for Coumadin Indication: atrial fibrillation  Allergies  Allergen Reactions  . Diphenhydramine Itching    Only with IV doses. Tolerates oral.  . Penicillins Other (See Comments)    migraine  . Adhesive [Tape] Rash    Please use paper tape  . Amoxicillin Rash    migraine  . Other Rash    Please use paper tape  . Penicillin G Rash    headache    Patient Measurements: Height: '5\' 10"'$  (177.8 cm) Weight: 184 lb 4.8 oz (83.598 kg) (scale b) IBW/kg (Calculated) : 73  Vital Signs: Temp: 98.5 F (36.9 C) (07/19 2205) Temp Source: Oral (07/19 2205) BP: 144/80 mmHg (07/19 2205) Pulse Rate: 68 (07/19 2205)  Labs:  Recent Labs  03/24/16 1526 03/24/16 1858 03/24/16 2136  HGB 10.5*  --   --   HCT 33.6*  --   --   PLT 157  --   --   LABPROT  --  23.8*  --   INR  --  2.15*  --   CREATININE 4.61*  --   --   TROPONINI 0.07*  --  0.07*    Estimated Creatinine Clearance: 17.6 mL/min (by C-G formula based on Cr of 4.61).   Medical History: Past Medical History  Diagnosis Date  . 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 04/2013: s/p CABG x 2 (Y SVG -LAD & D2). b. 01/2015: NSTEMI s/p DES to LCx, BMS to SVG-LAD. c. NSTEMI 05/2015 during AF/AFL - non-flow limiting FFR. d. Low risk nuc 3/17. e. STEMI 6/17 after coming off Plavix, s/p DES to SVG-LAD into native vessel.  . Aortic heart valve prolapse 04/2013    a. s/p bioprosthetic AVR at time of CABG.  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 Peripheral vascular disease ([place])     Cath in 01/2015 required 45 cm Destination Sheath  . Sleep apnea     a. intolerant of bipap.  Marland Kitchen PAF (paroxysmal atrial fibrillation) ([place])   . Multiple sclerosis ([place])   . Renal transplant failure and rejection   . 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 b. He had a renal  Tx from 2008 to 2011, but subsequent rejection - Gets HD in [place], [place] on MWF schedule.    Marland Kitchen Hypertension   . Multiple sclerosis ([place])   . SVT (supraventricular tachycardia) ([place])   . Paroxysmal atrial flutter ([place])     a. During 05/2015 admission - SVT, atrial flutter, and PAF.  Marland Kitchen Anemia   . Valvular heart disease     a. 2D echo 05/2015: EF 55-60%, no RWMA, mild AS/mild AI, mod MS, severely dilated LA.  . Carotid artery disease ([place])     a. 1-39% stenosis bilaterally in 06/2015.   . On home oxygen therapy     pt states he has not been wearing it  . Hematoma     a. Large right groin hematoma after cath 02/2016 with associated ABL anemia.  . Acute blood loss anemia     a. 02/2016 due to groin hematoma. Rec 3 U PRBC.  . Bifascicular block   . Nocturnal hypoxemia     a. Pt has home o2 QHS.    Medications:  Prescriptions prior to admission  Medication  Sig Dispense Refill Last Dose  . acetaminophen (TYLENOL) 500 MG tablet Take 1,000 mg by mouth every 6 (six) hours as needed for pain or fever.   03/23/2016 at Unknown time  . atorvastatin (LIPITOR) 40 MG tablet Take 1 tablet (40 mg total) by mouth every evening. (Patient taking differently: Take 20 mg by mouth daily at 6 PM. ) 30 tablet 6 03/23/2016 at Unknown time  . calcium acetate (PHOSLO) 667 MG capsule Take 4,002 mg by mouth 3 (three) times daily with meals.    03/24/2016 at Unknown time  . citalopram (CELEXA) 20 MG tablet Take 20 mg by mouth at bedtime.    03/23/2016 at Unknown time  . clopidogrel (PLAVIX) 75 MG tablet Take 1 tablet (75 mg total) by mouth daily. 90 tablet 3 03/24/2016 at Unknown time  . docusate sodium (COLACE) 100 MG capsule Take 300 mg by mouth 2 (two) times daily.   1/75/1025 at am  . folic acid (FOLVITE) 1 MG tablet Take 1 mg by mouth daily.   03/24/2016 at Unknown time  . gabapentin (NEURONTIN) 300 MG capsule Take 300 mg by mouth at bedtime.    03/23/2016 at Unknown time  . metoprolol tartrate (LOPRESSOR) 25 MG tablet  Take 1 tablet (25 mg total) by mouth 2 (two) times daily. 180 tablet 3 03/24/2016 at 0430  . multivitamin (RENA-VIT) TABS tablet Take 1 tablet by mouth daily. 30 tablet 1 03/24/2016 at Unknown time  . nitroGLYCERIN (NITROSTAT) 0.4 MG SL tablet Place 0.4 mg under the tongue every 5 (five) minutes as needed for chest pain (Take one tabe every 5-10 minutes for chest pain. Once you have taken three tabs you need to notify your MD or go to ER if chest pain not gone.).   prn  . ranitidine (ZANTAC) 150 MG tablet Take 150 mg by mouth daily.   03/24/2016 at Unknown time  . sodium polystyrene (KAYEXALATE) 15 GM/60ML suspension Take 15 g by mouth See admin instructions. Only take 15 g on Sun / Tues / Thurs / Sat (non dialysis days)   prn  . warfarin (COUMADIN) 7.5 MG tablet Take 3.75-7.5 mg by mouth as directed. Takes 1/2 tab on Tues, Sat 3.'75mg'$  Takes 1 tab all other days   03/23/2016 at Unknown time  . albuterol (PROAIR HFA) 108 (90 BASE) MCG/ACT inhaler Inhale 1-2 puffs into the lungs every 6 (six) hours as needed for shortness of breath.    prn   Scheduled:  . [START ON 03/25/2016] atorvastatin  20 mg Oral q1800  . [START ON 03/25/2016] calcium acetate  4,002 mg Oral TID WC  . citalopram  20 mg Oral QHS  . [START ON 03/25/2016] clopidogrel  75 mg Oral Daily  . docusate sodium  300 mg Oral BID  . [START ON 03/25/2016] famotidine  20 mg Oral Daily  . [START ON 8/52/7782] folic acid  1 mg Oral Daily  . gabapentin  300 mg Oral QHS  . metoprolol tartrate  25 mg Oral BID  . [START ON 03/25/2016] multivitamin  1 tablet Oral Daily  . sodium chloride flush  3 mL Intravenous Q12H    Assessment: 60yo male c/o worsening exertional SOB, to continue Coumadin for Afib during admission; current INR within goal w/ last dose of Coumadin taken 7/18.  Goal of Therapy:  INR 2-3   Plan:  Will continue home Coumadin dose of 7.'5mg'$  daily except 3.'75mg'$  Tue and Sat and monitor INR.  Wynona Neat, PharmD, BCPS  03/24/2016,10:46  PM   

## 2016-03-24 NOTE — Telephone Encounter (Signed)
New message    Pt c/o Shortness Of Breath: STAT if SOB developed within the last 24 hours or pt is noticeably SOB on the phone  1. Are you currently SOB (can you hear that pt is SOB on the phone)? Not as long as he is sitting but when he is moving  2. How long have you been experiencing SOB? 2 weeks  3. Are you SOB when sitting or when up moving around? Moving around  4. Are you currently experiencing any other symptoms? dizzy

## 2016-03-25 ENCOUNTER — Observation Stay (HOSPITAL_BASED_OUTPATIENT_CLINIC_OR_DEPARTMENT_OTHER): Payer: Medicare Other

## 2016-03-25 ENCOUNTER — Observation Stay (HOSPITAL_COMMUNITY): Payer: Medicare Other

## 2016-03-25 ENCOUNTER — Encounter (HOSPITAL_COMMUNITY): Payer: Self-pay | Admitting: Cardiology

## 2016-03-25 DIAGNOSIS — M7989 Other specified soft tissue disorders: Secondary | ICD-10-CM

## 2016-03-25 DIAGNOSIS — I5033 Acute on chronic diastolic (congestive) heart failure: Secondary | ICD-10-CM | POA: Diagnosis not present

## 2016-03-25 DIAGNOSIS — R0600 Dyspnea, unspecified: Secondary | ICD-10-CM

## 2016-03-25 DIAGNOSIS — I132 Hypertensive heart and chronic kidney disease with heart failure and with stage 5 chronic kidney disease, or end stage renal disease: Secondary | ICD-10-CM | POA: Diagnosis not present

## 2016-03-25 DIAGNOSIS — J438 Other emphysema: Secondary | ICD-10-CM | POA: Diagnosis not present

## 2016-03-25 DIAGNOSIS — R7989 Other specified abnormal findings of blood chemistry: Secondary | ICD-10-CM | POA: Diagnosis not present

## 2016-03-25 DIAGNOSIS — I2510 Atherosclerotic heart disease of native coronary artery without angina pectoris: Secondary | ICD-10-CM

## 2016-03-25 DIAGNOSIS — N186 End stage renal disease: Secondary | ICD-10-CM

## 2016-03-25 DIAGNOSIS — Z9861 Coronary angioplasty status: Secondary | ICD-10-CM

## 2016-03-25 DIAGNOSIS — I480 Paroxysmal atrial fibrillation: Secondary | ICD-10-CM

## 2016-03-25 LAB — BASIC METABOLIC PANEL
Anion gap: 9 (ref 5–15)
BUN: 43 mg/dL — AB (ref 6–20)
CHLORIDE: 98 mmol/L — AB (ref 101–111)
CO2: 30 mmol/L (ref 22–32)
CREATININE: 6.11 mg/dL — AB (ref 0.61–1.24)
Calcium: 9.3 mg/dL (ref 8.9–10.3)
GFR, EST AFRICAN AMERICAN: 10 mL/min — AB (ref >60)
GFR, EST NON AFRICAN AMERICAN: 9 mL/min — AB (ref >60)
Glucose, Bld: 83 mg/dL (ref 65–99)
Potassium: 4.9 mmol/L (ref 3.5–5.1)
SODIUM: 137 mmol/L (ref 135–145)

## 2016-03-25 LAB — TROPONIN I
TROPONIN I: 0.08 ng/mL — AB (ref <0.03)
TROPONIN I: 0.07 ng/mL — AB (ref <0.03)

## 2016-03-25 LAB — PROTIME-INR
INR: 1.91 — AB (ref 0.00–1.49)
Prothrombin Time: 21.8 seconds — ABNORMAL HIGH (ref 11.6–15.2)

## 2016-03-25 LAB — ECHOCARDIOGRAM COMPLETE
HEIGHTINCHES: 70 in
WEIGHTICAEL: 2942.4 [oz_av]

## 2016-03-25 NOTE — Progress Notes (Signed)
*  PRELIMINARY RESULTS* Echocardiogram 2D Echocardiogram has been performed.  Leavy Cella 03/25/2016, 4:54 PM

## 2016-03-25 NOTE — Consult Note (Signed)
Reason for Consult: CHF and elevated troponin  Referring Physician: Dr. Charlies Silvers   PCP:  Teressa Lower, MD  Primary Cardiologist: Dr. Gerda Diss Wehner is an 60 y.o. male.    Chief Complaint: DOE   HPI:   Asked to  See 60 y.o. 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  Recent hx with nuc 11/2015 in prep for repeat transplant eval was low risk at [facility]. In 01/2016, he was cleared to stop Plavix but remained on ASA + Coumadin. Per notes, it was felt by the transplant team that he would need to be off Plavix to be considered for transplantation. He was admitted 02/16/16 with midsternal chest pain with radiation to his jaw/teeth. EKG showed anterior STEMI. LHC 02/15/16: acute occlusion of SVG-LAD at anastamosis, patent LCx with moderate restenosis, moderate RCA disease, patent Y graft to diagonal with moderate ostial stenosis, s/p angioplasty/DES to SVG-LAD extending into the native vessel - this was a difficult procedure c/b large right groin hematoma. 2D 02/15/16: EF 55-60%, HK basal mid ateroseptal myocardium and HK of apical anterior myocardium, calcified AV with mild AS, mild AI, calcified MV with mobility restriction of posterior leaflet with mild MR, mod dilated LAE, PASP 25mHg. His post-hospital course notable for NSVT, prominent bifascicular block on EKG post-cath, ABL anemia, nocturnal hypoxia (known for  patient), and episodic hypotension. Coumadin was held for several days and actually reversed due to supratherapeutic INR and progressive anemia. Hgb was 11.7 on admission and drifted down to a nadir of 7.9. He received 3 u PRBC total. CT abd pelvis ruled out retroperitoneal hemorrhage. Groin [place] without pseudoaneurysm. Atorvastatin was increased from 1/2 tablet daily (wife was giving actually every other day due to prior myalgias) to full tablet daily. Hgb on day of DC was 10.3 and Coumadin was restarted. The plan was outlined for triple therapy initially and stopping aspirin 1-2 weeks after PCI, which Dr. NMeda Coffeecalculated 2 weeks at 02/28/16. Long term decisions will need to be made in 6 months to decide if it is safe to stop Plavix at some point - the patient is concerned that this time delay would exclude him from a kidney transplant (currently on the list), but our concern is that he developed STEMI only a week after discontinuation of Plavix. F/u Hgb 6/19 was 11.1.  Yesterday pt developed SOB with any exertion.  None at rest.  With dialysis last week pt had PAF and took extra dose metoprolol.  He had called our office and was recommended to go to ER.   In ER yesterday his EKG SR with incomplete RBBB, but no acute changes.  He was saturating low 90s on room air.  BNP 1785, troponin mildly elevated at 0.07 - 0.08 flat.  Mild antmia at hgb 10.5, hct 33.6  - INR on admit 2.15.  CXR  Stable cardiomegaly with vascular congestion, chronic background COPD/ emphysema and parenchymal  scarring, thoracic aortic atherosclerosis.  No superimposed definite acute process by plain radiography.  He was admitted and home meds continued.  Currently in bed without complaints.  He states the DOE began maybe 2 weeks ago.  No wheezing with the SOB. No chest pain.  With dialysis they took his dry wt down half a Kg.  No fevers and he has been exposed to ticks this summer, last one 1 week ago.              Past Medical  History  Diagnosis Date  . 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 04/2013: s/p CABG x 2 (Y SVG -LAD & D2). b. 01/2015: NSTEMI s/p DES to LCx, BMS to SVG-LAD. c. NSTEMI 05/2015 during AF/AFL - non-flow limiting FFR. d. Low risk nuc 3/17. e. STEMI 6/17 after coming off Plavix, s/p DES to SVG-LAD into native vessel.  . Aortic heart valve prolapse 04/2013    a. s/p bioprosthetic AVR at time of CABG.  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 Peripheral vascular disease ([place])     Cath in 01/2015 required 45 cm [place]  . Sleep apnea     a. intolerant of bipap.  Marland Kitchen PAF (paroxysmal atrial fibrillation) ([place])   . Multiple sclerosis ([place])   . Renal transplant failure and rejection   . 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 b. He had a renal Tx from 2008 to 2011, but subsequent rejection - Gets HD in [place], [place] on MWF schedule.    Marland Kitchen Hypertension   . Multiple sclerosis ([place])   . SVT (supraventricular tachycardia) ([place])   . Paroxysmal atrial flutter ([place])     a. During 05/2015 admission - SVT, atrial flutter, and PAF.  Marland Kitchen Anemia   . Valvular heart disease     a. 2D echo 05/2015: EF 55-60%, no RWMA, mild AS/mild AI, mod MS, severely dilated LA.  . Carotid artery disease ([place])     a. 1-39% stenosis bilaterally in 06/2015.   . On home oxygen therapy     pt states he has not been wearing it  . Hematoma     a. Large right groin hematoma after cath 02/2016 with associated ABL anemia.  . Acute blood loss anemia     a. 02/2016 due to groin hematoma. Rec 3 U PRBC.  . Bifascicular block   . Nocturnal hypoxemia     a. Pt has home o2 QHS.    Past Surgical History  Procedure Laterality Date  . Av fistula placement    . Flash      flash pulmonary edema  . Kidney transplant  08/2007  . Hernia repair  07/2011  . Excised squamous cells at rectum  2006  . Left heart  catheterization with coronary angiogram N/A 01/09/2013    Procedure: LEFT HEART CATHETERIZATION WITH CORONARY ANGIOGRAM;  Surgeon: Burnell Blanks, MD;  Location: [facility] CATH LAB;  Service: Cardiovascular;  Laterality: N/A;  . Cardiac catheterization N/A 05/26/2015    Procedure: Left Heart Cath and Coronary Angiography;  Surgeon: Leonie Man, MD;  Location: [facility] CV LAB;  Service: Cardiovascular;  Laterality: N/A;  . Cardiac catheterization N/A 05/26/2015    Procedure: Intravascular Pressure Wire/FFR Study;  Surgeon: Leonie Man, MD;  Location: [place] CV  LAB;  Service: Cardiovascular;  Laterality: N/A;  . Cardiac catheterization N/A 02/15/2016    Procedure: Left Heart Cath and Coronary Angiography;  Surgeon: Wellington Hampshire, MD;  Location: [facility] CV LAB;  Service: Cardiovascular;  Laterality: N/A;    Family History  Problem Relation Age of Onset  . Diabetes Father   . Heart failure Mother     started in 44s  . Lupus Sister   . Heart attack Maternal Grandfather   . Heart attack Maternal Uncle   . Heart attack Maternal Aunt   . Hypertension Maternal Aunt   . Stroke Neg Hx    Social History:  reports that he quit smoking about 17 years ago. His smoking use included Cigarettes. He has a 40 pack-year smoking history. He does not have any smokeless tobacco history on file. He reports that he does not drink alcohol or use illicit drugs.  Allergies:  Allergies  Allergen Reactions  . Diphenhydramine Itching    Only with IV doses. Tolerates oral.  . Penicillins Other (See Comments)    migraine  . Adhesive [Tape] Rash    Please use paper tape  . Amoxicillin Rash    migraine  . Other Rash    Please use paper tape  . Penicillin G Rash    headache    OUTPATIENT MEDICATIONS: No current facility-administered medications on file prior to encounter.   Current Outpatient Prescriptions on File Prior to Encounter  Medication Sig Dispense Refill  . acetaminophen  (TYLENOL) 500 MG tablet Take 1,000 mg by mouth every 6 (six) hours as needed for pain or fever.    Marland Kitchen atorvastatin (LIPITOR) 40 MG tablet Take 1 tablet (40 mg total) by mouth every evening. (Patient taking differently: Take 20 mg by mouth daily at 6 PM. ) 30 tablet 6  . calcium acetate (PHOSLO) 667 MG capsule Take 4,002 mg by mouth 3 (three) times daily with meals.     . citalopram (CELEXA) 20 MG tablet Take 20 mg by mouth at bedtime.     . clopidogrel (PLAVIX) 75 MG tablet Take 1 tablet (75 mg total) by mouth daily. 90 tablet 3  . docusate sodium (COLACE) 100 MG capsule Take 300 mg by mouth 2 (two) times daily.    . folic acid (FOLVITE) 1 MG tablet Take 1 mg by mouth daily.    Marland Kitchen gabapentin (NEURONTIN) 300 MG capsule Take 300 mg by mouth at bedtime.     . metoprolol tartrate (LOPRESSOR) 25 MG tablet Take 1 tablet (25 mg total) by mouth 2 (two) times daily. 180 tablet 3  . multivitamin (RENA-VIT) TABS tablet Take 1 tablet by mouth daily. 30 tablet 1  . nitroGLYCERIN (NITROSTAT) 0.4 MG SL tablet Place 0.4 mg under the tongue every 5 (five) minutes as needed for chest pain (Take one tabe every 5-10 minutes for chest pain. Once you have taken three tabs you need to notify your MD or go to ER if chest pain not gone.).    Marland Kitchen ranitidine (ZANTAC) 150 MG tablet Take 150 mg by mouth daily.    . sodium polystyrene (KAYEXALATE) 15 GM/60ML suspension Take 15 g by mouth See admin instructions. Only take 15 g on Sun / Tues / Thurs / Sat (non dialysis days)    . warfarin (COUMADIN) 7.5 MG tablet Take 3.75-7.5 mg by mouth as directed. Takes 1/2 tab on Tues, Sat 3.68m Takes 1 tab all other days    . albuterol (PROAIR HFA) 108 (90 BASE)  MCG/ACT inhaler Inhale 1-2 puffs into the lungs every 6 (six) hours as needed for shortness of breath.      CURRENT MEDICATIONS: Scheduled Meds: . atorvastatin  20 mg Oral q1800  . calcium acetate  4,002 mg Oral TID WC  . citalopram  20 mg Oral QHS  . clopidogrel  75 mg Oral Daily    . docusate sodium  300 mg Oral BID  . famotidine  20 mg Oral Daily  . folic acid  1 mg Oral Daily  . gabapentin  300 mg Oral QHS  . metoprolol tartrate  25 mg Oral BID  . multivitamin  1 tablet Oral Daily  . sodium chloride flush  3 mL Intravenous Q12H  . [START ON 03/27/2016] warfarin  3.75 mg Oral Once per day on Tue Sat  . warfarin  7.5 mg Oral Once per day on Sun Mon Wed Thu Fri  . Warfarin - Pharmacist Dosing Inpatient   Does not apply q1800   Continuous Infusions:  PRN Meds:.sodium chloride, acetaminophen, hydrALAZINE, ipratropium-albuterol, nitroGLYCERIN, ondansetron (ZOFRAN) IV, sodium chloride flush   Results for orders placed or performed during the hospital encounter of 03/24/16 (from the past 48 hour(s))  Basic metabolic panel     Status: Abnormal   Collection Time: 03/24/16  3:26 PM  Result Value Ref Range   Sodium 137 135 - 145 mmol/L   Potassium 5.0 3.5 - 5.1 mmol/L   Chloride 97 (L) 101 - 111 mmol/L   CO2 31 22 - 32 mmol/L   Glucose, Bld 92 65 - 99 mg/dL   BUN 26 (H) 6 - 20 mg/dL   Creatinine, Ser 4.61 (H) 0.61 - 1.24 mg/dL   Calcium 9.3 8.9 - 10.3 mg/dL   GFR calc non Af Amer 13 (L) >60 mL/min   GFR calc Af Amer 15 (L) >60 mL/min    Comment: (NOTE) The eGFR has been calculated using the CKD EPI equation. This calculation has not been validated in all clinical situations. eGFR's persistently <60 mL/min signify possible Chronic Kidney Disease.    Anion gap 9 5 - 15  CBC     Status: Abnormal   Collection Time: 03/24/16  3:26 PM  Result Value Ref Range   WBC 5.1 4.0 - 10.5 K/uL   RBC 3.34 (L) 4.22 - 5.81 MIL/uL   Hemoglobin 10.5 (L) 13.0 - 17.0 g/dL   HCT 33.6 (L) 39.0 - 52.0 %   MCV 100.6 (H) 78.0 - 100.0 fL   MCH 31.4 26.0 - 34.0 pg   MCHC 31.3 30.0 - 36.0 g/dL   RDW 15.8 (H) 11.5 - 15.5 %   Platelets 157 150 - 400 K/uL  Troponin I     Status: Abnormal   Collection Time: 03/24/16  3:26 PM  Result Value Ref Range   Troponin I 0.07 (HH) <0.03 ng/mL     Comment: CRITICAL RESULT CALLED TO, READ BACK BY AND VERIFIED WITH: C CARRAN,RN 1803 03/24/2016 WBOND   Brain natriuretic peptide     Status: Abnormal   Collection Time: 03/24/16  3:26 PM  Result Value Ref Range   B Natriuretic Peptide 1785.3 (H) 0.0 - 100.0 pg/mL  Hepatic function panel     Status: Abnormal   Collection Time: 03/24/16  3:26 PM  Result Value Ref Range   Total Protein 7.4 6.5 - 8.1 g/dL   Albumin 3.4 (L) 3.5 - 5.0 g/dL   AST 30 15 - 41 U/L   ALT 24 17 - 63  U/L   Alkaline Phosphatase 78 38 - 126 U/L   Total Bilirubin 0.7 0.3 - 1.2 mg/dL   Bilirubin, Direct 0.2 0.1 - 0.5 mg/dL   Indirect Bilirubin 0.5 0.3 - 0.9 mg/dL  Protime-INR     Status: Abnormal   Collection Time: 03/24/16  6:58 PM  Result Value Ref Range   Prothrombin Time 23.8 (H) 11.6 - 15.2 seconds   INR 2.15 (H) 0.00 - 1.49  Troponin I     Status: Abnormal   Collection Time: 03/24/16  9:36 PM  Result Value Ref Range   Troponin I 0.07 (HH) <0.03 ng/mL    Comment: CRITICAL VALUE NOTED.  VALUE IS CONSISTENT WITH PREVIOUSLY REPORTED AND CALLED VALUE.  Basic metabolic panel     Status: Abnormal   Collection Time: 03/25/16  4:12 AM  Result Value Ref Range   Sodium 137 135 - 145 mmol/L   Potassium 4.9 3.5 - 5.1 mmol/L   Chloride 98 (L) 101 - 111 mmol/L   CO2 30 22 - 32 mmol/L   Glucose, Bld 83 65 - 99 mg/dL   BUN 43 (H) 6 - 20 mg/dL   Creatinine, Ser 6.11 (H) 0.61 - 1.24 mg/dL   Calcium 9.3 8.9 - 10.3 mg/dL   GFR calc non Af Amer 9 (L) >60 mL/min   GFR calc Af Amer 10 (L) >60 mL/min    Comment: (NOTE) The eGFR has been calculated using the CKD EPI equation. This calculation has not been validated in all clinical situations. eGFR's persistently <60 mL/min signify possible Chronic Kidney Disease.    Anion gap 9 5 - 15  Troponin I     Status: Abnormal   Collection Time: 03/25/16  4:12 AM  Result Value Ref Range   Troponin I 0.08 (HH) <0.03 ng/mL    Comment: CRITICAL VALUE NOTED.  VALUE IS CONSISTENT  WITH PREVIOUSLY REPORTED AND CALLED VALUE.  Protime-INR     Status: Abnormal   Collection Time: 03/25/16  4:12 AM  Result Value Ref Range   Prothrombin Time 21.8 (H) 11.6 - 15.2 seconds   INR 1.91 (H) 0.00 - 1.49  Troponin I     Status: Abnormal   Collection Time: 03/25/16 10:07 AM  Result Value Ref Range   Troponin I 0.07 (HH) <0.03 ng/mL    Comment: CRITICAL VALUE NOTED.  VALUE IS CONSISTENT WITH PREVIOUSLY REPORTED AND CALLED VALUE.   Dg Chest 2 View  03/24/2016  CLINICAL DATA:  Shortness of breath with exertion for 2 weeks. Previous coronary bypass. Recent coronary stent 1 month ago. EXAM: CHEST  2 VIEW COMPARISON:  02/15/2016 FINDINGS: Previous median sternotomy. Upper sternotomy wires are broken with a similar appearance. cardiomegaly with central vascular congestion. No definite CHF pattern or focal pneumonia. Mild background COPD/ emphysema pattern with parenchymal scarring, similar to prior study. No effusion or pneumothorax. Trachea midline. Previous aortic valve replacement present. IMPRESSION: Stable cardiomegaly with vascular congestion Chronic background COPD/ emphysema and parenchymal scarring Thoracic aortic atherosclerosis No superimposed definite acute process by plain radiography Electronically Signed   By: Jerilynn Mages.  Shick M.D.   On: 03/24/2016 16:38    ROS: General:no colds or fevers,  weight down from 6/22 by 5 lbs. Skin:no rashes or ulcers HEENT:no blurred vision, no congestion CV:see HPI PUL:see HPI GI:no diarrhea constipation or melena, no indigestion, states when SOB his abd pushes out and feels tight.  He does have hernia. GU:no hematuria, no dysuria MS:no joint pain, no claudication Neuro:no syncope, no lightheadedness Endo:no  diabetes, no thyroid disease  Blood pressure 155/83, pulse 64, temperature 98.3 F (36.8 C), temperature source Oral, resp. rate 18, height 5' 10"  (1.778 m), weight 183 lb 14.4 oz (83.416 kg), SpO2 95 %.  Wt Readings from Last 3 Encounters:    03/25/16 183 lb 14.4 oz (83.416 kg)  02/26/16 187 lb 3.2 oz (84.913 kg)  02/19/16 185 lb 1.6 oz (83.961 kg)    PE: General:Pleasant affect, NAD Skin:Warm and dry, brisk capillary refill HEENT:normocephalic, sclera clear, mucus membranes moist Neck:supple, + JVD, + bruits maybe from AV graft or radiation of murmur  Heart:S1S2 RRR with 7-0/6 systolic murmur, no gallup, rub or click Lungs:somewhat diminished. without rales, rhonchi, or wheezes CBJ:SEGB, non tender, + BS, do not palpate liver spleen or masses Ext:no lower ext edema, 2+ pedal pulses, 2+ radial pulses Neuro:alert and oriented X 3, MAE, follows commands, + facial symmetry   Assessment/Plan Principal Problem:   Diastolic CHF, acute on chronic (HCC) Active Problems:   Multiple sclerosis (HCC)   OSA -not compliant with C-pap   PAF (paroxysmal atrial fibrillation) (HCC)   ESRD on hemodialysis   Essential (primary) hypertension   CAD- s/p CABG in Aug 2014 with Y SVG to LAD & D2; NSTEMI 01/2015 [facility] s/p DES to LCx and BMS to SVG-LAD, NSTEMI 05/2015 non-flow limiting FFR of Cx, STEMI 02/2016 s/p angioplasty/DES to SVG-LAD   Anemia   Hx of CABG x 2 2014   Emphysema/COPD (HCC)   Elevated troponin   SOB (shortness of breath)   Left leg swelling   Macrocytic anemia  1. DOE, pt fine in bed.  BNP was elevated but pt is dialysis pt.  His troponin is flat in dialyisi pt and minimally elevated.  His murmur is harsh and last 2D 02/15/16: EF 55-60%, HK basal mid ateroseptal myocardium and HK of apical anterior myocardium, calcified AV with mild AS, mild AI, calcified MV with mobility restriction to immobility of posterior leaflet with mild MR, mod dilated LAE, PASP 2mHg. Repeat echo has been ordered.  BNP is elevated but has been higher.  - ambulate to see if arrhthymias causing the DOE.   2.  CAD with hx CABG and graft dysfunction with recent Hx of STEMI 02/2016 with DES to VG-LAD. Now off ASA on plavix and Coumadin.   3. ESRD has had  renal transplant that failed and now on wait list for renal transplant once off plavix.   4.  Hx of AVR with Edwards 23 mm bioprosthetic valve at time of CABG, stable on last Echo.  5. PAF, he did have at dialysis but none here on tele. On coumadin CHA2DS2VASc score of 2   In June his BB was increased to 37.5 BID and pt felt more symptomatic on follow up visit.  HR was 53.  Metoprolol decreased to 25 BID.    6. Hx of recent STEMI with new stent.      LCecilie Kicks Nurse Practitioner Certified CSan AugustinePager [phone] after 5pm or weekends call [phone] 03/25/2016, 1:22 PM  Patient seen and examined  I agree with findingas as noted above by L INgold   Pt is a 60yo who is followed by CEstevan Ryder Had recent intervention for AMI.  After the procedure he said he felt great  Hewas seen in clinic in June  Found to be in atrial fib  Still good  Since then though he has become more SOB   He deneis  CP  Diet same  He underwent dialysis yeasterday   On exm :  Lungs CTA  Cardiac RRR  No S3  Ext 1+ LLE    Echo pending    I am not convinced of an active cardiac ischemia   I also am not convinced it is related to afib   His labs today BNP is signif elevated  ? If he needs to go to a lower dry weight  Due for dialysis tomorrow   Keep on telemetry.   Will continue to follow.  Dorris Carnes

## 2016-03-25 NOTE — Progress Notes (Signed)
Preliminary results by tech - Left Lower Ext. Venous Duplex Completed. Negative for deep and superficial vein thrombosis in the left leg. A  Baker's Cyst was noted behind the left knee. Oda Cogan, BS, RDMS, RVT

## 2016-03-25 NOTE — Progress Notes (Signed)
ANTICOAGULATION CONSULT NOTE - Follow Up Consult  Pharmacy Consult for warfarin Indication: atrial fibrillation  Allergies  Allergen Reactions  . Diphenhydramine Itching    Only with IV doses. Tolerates oral.  . Penicillins Other (See Comments)    migraine  . Adhesive [Tape] Rash    Please use paper tape  . Amoxicillin Rash    migraine  . Other Rash    Please use paper tape  . Penicillin G Rash    headache    Patient Measurements: Height: '5\' 10"'$  (177.8 cm) Weight: 183 lb 14.4 oz (83.416 kg) (scale b) IBW/kg (Calculated) : 73  Vital Signs: Temp: 97.9 F (36.6 C) (07/20 0802) Temp Source: Oral (07/20 0802) BP: 134/77 mmHg (07/20 0802) Pulse Rate: 62 (07/20 0802)  Labs:  Recent Labs  03/24/16 1526 03/24/16 1858 03/24/16 2136 03/25/16 0412  HGB 10.5*  --   --   --   HCT 33.6*  --   --   --   PLT 157  --   --   --   LABPROT  --  23.8*  --  21.8*  INR  --  2.15*  --  1.91*  CREATININE 4.61*  --   --  6.11*  TROPONINI 0.07*  --  0.07* 0.08*    Estimated Creatinine Clearance: 13.3 mL/min (by C-G formula based on Cr of 6.11).  Assessment: - On warfarin for Afib - Home Dose: 7.'5mg'$  daily except 3.'75mg'$  Tues and Saturday - INR today 1.91 - H/H 10.5/33.6, Plts 157 - Recent hematoma in 02/2016 after cath procedure leading to acute anemia, no signs of bleeding today    INR History: -7/19: 2.15 -7/20: 1.91  Goal of Therapy:  INR 2-3 Monitor platelets by anticoagulation protocol: Yes   Plan:  - Continue home warfarin regimen and give 7.'5mg'$  tonight x1 - Monitor INR, H/H/Plts   Myer Peer Grayland Ormond), PharmD  PGY1 Pharmacy Resident Pager: [phone] 03/25/2016 10:45 AM

## 2016-03-25 NOTE — Progress Notes (Signed)
Patient ID: [name], male   DOB: [birth year], 60 y.o.   MRN: [id number]  PROGRESS NOTE    Hosey Burmester  NTI:[id number] DOB: 30-May-1956 DOA: 03/24/2016  PCP: Teressa Lower, MD   Brief Narrative:  60 y.o. male with past medical history significant for end-stage renal disease with failed transplant now on HD MWF, paroxysmal atrial fibrillation on anticoagulation with Coumadin, chronic diastolic CHF with last 2-D echo June 2017 with preserved ejection fraction, aortic disease status post AVR, and CAD with STEMI attributed to acute occlusion of SVG-LAD treated with DES. Patient presented to [facility] for worsening shortness of breath for past 2 weeks prior to this admission.  Patient apparently being considered for second renal transplant however he had to be taken off of Plavix for this to happen. When patient was taken off of Plavix he unfortunately developed acute chest pain and was found to have STEMI, managed with drug-eluting stent. He was then discharged back on Plavix and aspirin and Coumadin. Aspirin was subsequently discontinued 2 weeks later and currently he is only on Plavix and Coumadin.  Patient has been doing well after that hospitalization but over past 2 weeks he noticed progressive shortness of breath. No significant leg swelling and he reports his left leg has chronically been bigger than the right leg. No reports of cough or fevers or chest pains or palpitations.  Patient was hemodynamically stable in ED. His oxygen saturation was in the low 90s on room air. EKG showed normal sinus rhythm with low voltage QRS and incomplete right bundle branch block. Chest x-ray showed stable cardiomegaly with vascular congestion with chronic emphysema and parenchymal scarring. His blood work was notable for creatinine of 4.61, hemoglobin 10.5. INR was 2.15. Troponin level mildly elevated at 0.07. BNP was elevated at 1785. Cardiology will see the patient in consultation.   Assessment &  Plan:   Principal Problem:   Diastolic CHF, acute on chronic [facility]) - Last 2-D echo in June 2017 showed preserved ejection fraction - Appreciate cardiology input - BNP is 1785 on this admission. Patient is not on Lasix. - Continue daily weight and strict intake and output. - His current weight is 83.4 kg. Weight on the admission was 83.9 kg.  Active Problems:   PAF (paroxysmal atrial fibrillation) (HCC) - CHA2DS2 vasc score 3 (htn, CHF, vascular history) - On AC with coumadin - Rate controlled with metoprolol    ESRD on hemodialysis - ON MWF - Management per renal    Essential  hypertension - Continue metoprolol 25 mg twice daily    CAD- s/p CABG in Aug 2014 with Y SVG to LAD & D2; NSTEMI 01/2015 [facility] s/p DES to LCx and BMS to SVG-LAD, NSTEMI 05/2015 non-flow limiting FFR of Cx, STEMI 02/2016 s/p angioplasty/DES to SVG-LAD / Mild troponin elevation  - Mild troponin elevation likely secondary to demand ischemia from acute decompensated CHF as well as end-stage renal disease  - Continue plavix  - Appreciate cardio consult and recommendations     Anemia of chronic disease - Related to history of fifth H renal disease - Hemoglobin stable    Emphysema/COPD ([place]) - Stable respiratory status    Dyslipidemia - Continue statin therapy   DVT prophylaxis: On anticoagulation with Coumadin Code Status: full code  Family Communication: no family at the bedside this am Disposition Plan: home by 7/22 if stable in regards to cardiovascular status    Consultants:   Cardio   Procedures:   None   Antimicrobials:  None    Subjective: No overnight events.   Objective: Filed Vitals:   03/25/16 0018 03/25/16 3212 03/25/16 0802 03/25/16 0846  BP: 136/81 126/77 134/77   Pulse: 66 65 62   Temp: 98.4 F (36.9 C) 97.3 F (36.3 C) 97.9 F (36.6 C)   TempSrc: Oral Oral Oral   Resp: '20 20 18   '$ Height:      Weight:  83.416 kg (183 lb 14.4 oz)    SpO2: 97% 92% 98% 93%     Intake/Output Summary (Last 24 hours) at 03/25/16 0929 Last data filed at 03/25/16 0841  Gross per 24 hour  Intake    243 ml  Output      0 ml  Net    243 ml   Filed Weights   03/24/16 1526 03/24/16 2205 03/25/16 0613  Weight: 83.915 kg (185 lb) 83.598 kg (184 lb 4.8 oz) 83.416 kg (183 lb 14.4 oz)    Examination:  General exam: Appears calm and comfortable  Respiratory system: No wheezing, no rhonchi  Cardiovascular system: S1 & S2 heard, Rate controlled  Gastrointestinal system: Abdomen is nondistended, soft and nontender. No organomegaly or masses felt. Normal bowel sounds heard. Central nervous system: Alert and oriented. No focal neurological deficits. Extremities: Left leg more swollen than the right but per patient chronic Skin: No rashes, lesions or ulcers Psychiatry: Judgement and insight appear normal. Mood & affect appropriate.   Data Reviewed: I have personally reviewed following labs and imaging studies  CBC:  Recent Labs Lab 03/24/16 1526  WBC 5.1  HGB 10.5*  HCT 33.6*  MCV 100.6*  PLT 248   Basic Metabolic Panel:  Recent Labs Lab 03/24/16 1526 03/25/16 0412  NA 137 137  K 5.0 4.9  CL 97* 98*  CO2 31 30  GLUCOSE 92 83  BUN 26* 43*  CREATININE 4.61* 6.11*  CALCIUM 9.3 9.3   GFR: Estimated Creatinine Clearance: 13.3 mL/min (by C-G formula based on Cr of 6.11). Liver Function Tests:  Recent Labs Lab 03/24/16 1526  AST 30  ALT 24  ALKPHOS 78  BILITOT 0.7  PROT 7.4  ALBUMIN 3.4*   No results for input(s): LIPASE, AMYLASE in the last 168 hours. No results for input(s): AMMONIA in the last 168 hours. Coagulation Profile:  Recent Labs Lab 03/24/16 1858 03/25/16 0412  INR 2.15* 1.91*   Cardiac Enzymes:  Recent Labs Lab 03/24/16 1526 03/24/16 2136 03/25/16 0412  TROPONINI 0.07* 0.07* 0.08*   BNP (last 3 results) No results for input(s): PROBNP in the last 8760 hours. HbA1C: No results for input(s): HGBA1C in the last 72  hours. CBG: No results for input(s): GLUCAP in the last 168 hours. Lipid Profile: No results for input(s): CHOL, HDL, LDLCALC, TRIG, CHOLHDL, LDLDIRECT in the last 72 hours. Thyroid Function Tests: No results for input(s): TSH, T4TOTAL, FREET4, T3FREE, THYROIDAB in the last 72 hours. Anemia Panel: No results for input(s): VITAMINB12, FOLATE, FERRITIN, TIBC, IRON, RETICCTPCT in the last 72 hours. Urine analysis: No results found for: COLORURINE, APPEARANCEUR, LABSPEC, PHURINE, GLUCOSEU, HGBUR, BILIRUBINUR, KETONESUR, PROTEINUR, UROBILINOGEN, NITRITE, LEUKOCYTESUR Sepsis Labs: '@LABRCNTIP'$ (procalcitonin:4,lacticidven:4)   )No results found for this or any previous visit (from the past 240 hour(s)).    Radiology Studies: Dg Chest 2 View 03/24/2016  Stable cardiomegaly with vascular congestion Chronic background COPD/ emphysema and parenchymal scarring Thoracic aortic atherosclerosis No superimposed definite acute process by plain radiography.   Scheduled Meds: . atorvastatin  20 mg Oral q1800  . calcium acetate  4,002 mg Oral TID WC  . citalopram  20 mg Oral QHS  . clopidogrel  75 mg Oral Daily  . docusate sodium  300 mg Oral BID  . famotidine  20 mg Oral Daily  . folic acid  1 mg Oral Daily  . gabapentin  300 mg Oral QHS  . metoprolol tartrate  25 mg Oral BID  . multivitamin  1 tablet Oral Daily  . sodium chloride flush  3 mL Intravenous Q12H  . [START ON 03/27/2016] warfarin  3.75 mg Oral Once per day on Tue Sat  . warfarin  7.5 mg Oral Once per day on Sun Mon Wed Thu Fri  . Warfarin - Pharmacist Dosing Inpatient   Does not apply q1800   Continuous Infusions:      Time spent: 25 minutes  Greater than 50% of the time spent on counseling and coordinating the care.   Leisa Lenz, MD [facility] Pager [phone]  If 7PM-7AM, please contact night-coverage [email or web address] Password [facility] 03/25/2016, 9:29 AM

## 2016-03-25 NOTE — Care Management Obs Status (Signed)
[place] NOTIFICATION   Patient Details  Name: [name] MRN: [id number] Date of Birth: Aug 17, 1956   Medicare Observation Status Notification Given:  Yes    Said Rueb, Rory Percy, RN 03/25/2016, 3:18 PM

## 2016-03-25 NOTE — Progress Notes (Signed)
Pt refused CPAP and would notify RN if he needed it on this night.

## 2016-03-26 DIAGNOSIS — R7989 Other specified abnormal findings of blood chemistry: Secondary | ICD-10-CM | POA: Diagnosis not present

## 2016-03-26 DIAGNOSIS — I10 Essential (primary) hypertension: Secondary | ICD-10-CM

## 2016-03-26 DIAGNOSIS — I132 Hypertensive heart and chronic kidney disease with heart failure and with stage 5 chronic kidney disease, or end stage renal disease: Secondary | ICD-10-CM | POA: Diagnosis not present

## 2016-03-26 DIAGNOSIS — I5033 Acute on chronic diastolic (congestive) heart failure: Secondary | ICD-10-CM | POA: Diagnosis not present

## 2016-03-26 DIAGNOSIS — N186 End stage renal disease: Secondary | ICD-10-CM | POA: Diagnosis not present

## 2016-03-26 DIAGNOSIS — I2510 Atherosclerotic heart disease of native coronary artery without angina pectoris: Secondary | ICD-10-CM | POA: Diagnosis not present

## 2016-03-26 LAB — RENAL FUNCTION PANEL
ALBUMIN: 3.3 g/dL — AB (ref 3.5–5.0)
ANION GAP: 10 (ref 5–15)
BUN: 69 mg/dL — ABNORMAL HIGH (ref 6–20)
CALCIUM: 10.5 mg/dL — AB (ref 8.9–10.3)
CHLORIDE: 99 mmol/L — AB (ref 101–111)
CO2: 26 mmol/L (ref 22–32)
Creatinine, Ser: 8.88 mg/dL — ABNORMAL HIGH (ref 0.61–1.24)
GFR calc non Af Amer: 6 mL/min — ABNORMAL LOW (ref >60)
GFR, EST AFRICAN AMERICAN: 7 mL/min — AB (ref >60)
Glucose, Bld: 97 mg/dL (ref 65–99)
POTASSIUM: 5 mmol/L (ref 3.5–5.1)
Phosphorus: 6.5 mg/dL — ABNORMAL HIGH (ref 2.5–4.6)
Sodium: 135 mmol/L (ref 135–145)

## 2016-03-26 LAB — BASIC METABOLIC PANEL
Anion gap: 11 (ref 5–15)
BUN: 65 mg/dL — AB (ref 6–20)
CHLORIDE: 98 mmol/L — AB (ref 101–111)
CO2: 29 mmol/L (ref 22–32)
CREATININE: 8.37 mg/dL — AB (ref 0.61–1.24)
Calcium: 10.3 mg/dL (ref 8.9–10.3)
GFR calc non Af Amer: 6 mL/min — ABNORMAL LOW (ref >60)
GFR, EST AFRICAN AMERICAN: 7 mL/min — AB (ref >60)
Glucose, Bld: 120 mg/dL — ABNORMAL HIGH (ref 65–99)
Potassium: 5.3 mmol/L — ABNORMAL HIGH (ref 3.5–5.1)
Sodium: 138 mmol/L (ref 135–145)

## 2016-03-26 LAB — CBC
HEMATOCRIT: 30 % — AB (ref 39.0–52.0)
HEMOGLOBIN: 9.6 g/dL — AB (ref 13.0–17.0)
MCH: 32.1 pg (ref 26.0–34.0)
MCHC: 32 g/dL (ref 30.0–36.0)
MCV: 100.3 fL — ABNORMAL HIGH (ref 78.0–100.0)
Platelets: 147 10*3/uL — ABNORMAL LOW (ref 150–400)
RBC: 2.99 MIL/uL — AB (ref 4.22–5.81)
RDW: 16 % — ABNORMAL HIGH (ref 11.5–15.5)
WBC: 6.2 10*3/uL (ref 4.0–10.5)

## 2016-03-26 LAB — PROTIME-INR
INR: 2.26 — AB (ref 0.00–1.49)
Prothrombin Time: 24.7 seconds — ABNORMAL HIGH (ref 11.6–15.2)

## 2016-03-26 MED ORDER — DARBEPOETIN ALFA 100 MCG/0.5ML IJ SOSY
PREFILLED_SYRINGE | INTRAMUSCULAR | Status: AC
  Filled 2016-03-26: qty 0.5

## 2016-03-26 MED ORDER — CALCITRIOL 0.25 MCG PO CAPS
0.2500 ug | ORAL_CAPSULE | Freq: Every day | ORAL | Status: DC
  Administered 2016-03-26 – 2016-03-27 (×2): 0.25 ug via ORAL
  Filled 2016-03-26: qty 1

## 2016-03-26 MED ORDER — DARBEPOETIN ALFA 100 MCG/0.5ML IJ SOSY
100.0000 ug | PREFILLED_SYRINGE | INTRAMUSCULAR | Status: DC
  Administered 2016-03-26: 100 ug via INTRAVENOUS

## 2016-03-26 MED ORDER — CALCITRIOL 0.25 MCG PO CAPS
ORAL_CAPSULE | ORAL | Status: AC
  Filled 2016-03-26: qty 1

## 2016-03-26 MED ORDER — SODIUM CHLORIDE 0.9 % IV SOLN: 62.5000 mg | INTRAVENOUS | Status: DC

## 2016-03-26 NOTE — Progress Notes (Signed)
Patient ID: [name], male   DOB: 12/06/1955, 60 y.o.   MRN: [id number]  PROGRESS NOTE    Jachob Mcclean  XBJ:[id number] DOB: 1956/07/11 DOA: 03/24/2016  PCP: Teressa Lower, MD   Brief Narrative:  60 y.o. male with past medical history significant for end-stage renal disease with failed transplant now on HD MWF, paroxysmal atrial fibrillation on anticoagulation with Coumadin, chronic diastolic CHF with last 2-D echo June 2017 with preserved ejection fraction, aortic disease status post AVR, and CAD with STEMI attributed to acute occlusion of SVG-LAD treated with DES. Patient presented to [facility] for worsening shortness of breath for past 2 weeks prior to this admission.  Patient apparently being considered for second renal transplant however he had to be taken off of Plavix for this to happen. When patient was taken off of Plavix he unfortunately developed acute chest pain and was found to have STEMI, managed with drug-eluting stent. He was then discharged back on Plavix and aspirin and Coumadin. Aspirin was subsequently discontinued 2 weeks later and currently he is only on Plavix and Coumadin.  Patient has been doing well after that hospitalization but over past 2 weeks he noticed progressive shortness of breath. No significant leg swelling and he reports his left leg has chronically been bigger than the right leg. No reports of cough or fevers or chest pains or palpitations.  Patient was hemodynamically stable in ED. His oxygen saturation was in the low 90s on room air. EKG showed normal sinus rhythm with low voltage QRS and incomplete right bundle branch block. Chest x-ray showed stable cardiomegaly with vascular congestion with chronic emphysema and parenchymal scarring. His blood work was notable for creatinine of 4.61, hemoglobin 10.5. INR was 2.15. Troponin level mildly elevated at 0.07. BNP was elevated at 1785. Cardiology has seen the patient in consultation.   Assessment &  Plan:   Principal Problem:   Diastolic CHF, acute on chronic (HCC) - Last 2-D echo in June 2017 showed preserved ejection fraction - BNP is 1785 on this admission - His 2 D ECHO on this admission showed EF 50% with grade 2 diastolic dysfunction  - Weight since admission 7/19 83.5 kg --> 84.2 kg  - LE doppler negative for DVT - Continue daily weight and strict intake and output. - Appreciate cardio following and their recommendation   Active Problems:   PAF (paroxysmal atrial fibrillation) (HCC) - CHA2DS2 vasc score 3 (htn, CHF, vascular history) - Continue coumadin - Rate controlled with metoprolol    ESRD on hemodialysis - ON MWF - Management per renal    Essential  hypertension - Continue metoprolol 25 mg twice daily    CAD- s/p CABG in Aug 2014 with Y SVG to LAD & D2; NSTEMI 01/2015 [facility] s/p DES to LCx and BMS to SVG-LAD, NSTEMI 05/2015 non-flow limiting FFR of Cx, STEMI 02/2016 s/p angioplasty/DES to SVG-LAD / Mild troponin elevation  - Mild troponin elevation likely secondary to demand ischemia from acute decompensated CHF as well as end-stage renal disease  - Continue plavix     Anemia of chronic disease - Related to history of fifth H renal disease - Hemoglobin stable at 9.6    Emphysema/COPD ([place]) - Stable respiratory status    Dyslipidemia - Continue statin therapy   DVT prophylaxis: On anticoagulation with Coumadin Code Status: full code  Family Communication: no family at the bedside this am Disposition Plan: home today possible after HD if cleared by cardio    Consultants:  Cardio   Procedures:   LLE doppler 03/25/2016 - No DVT  2 D EHCO 03/25/2016 - EF 55% with grade 2 diastolic dysfunction   Antimicrobials:   None    Subjective: No overnight events. No reports of shortness of breath.   Objective: Filed Vitals:   03/25/16 1205 03/25/16 1945 03/26/16 0026 03/26/16 0608  BP: 155/83 174/88 143/80 139/79  Pulse: 64 65 60 62  Temp: 98.3 F (36.8  C) 98.1 F (36.7 C) 97.8 F (36.6 C) 97.7 F (36.5 C)  TempSrc: Oral Oral Oral Oral  Resp: '18 18 18 18  '$ Height:      Weight:    84.278 kg (185 lb 12.8 oz)  SpO2: 95% 94% 94% 95%    Intake/Output Summary (Last 24 hours) at 03/26/16 0852 Last data filed at 03/26/16 0700  Gross per 24 hour  Intake    600 ml  Output      1 ml  Net    599 ml   Filed Weights   03/24/16 2205 03/25/16 0613 03/26/16 0608  Weight: 83.598 kg (184 lb 4.8 oz) 83.416 kg (183 lb 14.4 oz) 84.278 kg (185 lb 12.8 oz)    Examination:  General exam: No acute distress  Respiratory system: Bilateral air entry, no wheezing  Cardiovascular system: S1 & S2 (+), RRR Gastrointestinal system: (+) BS, non tender  Central nervous system: No focal neurological deficits. Extremities: Left leg more swollen than the right, palpable pulses  Skin: Warm, dry  Psychiatry: Mood & affect appropriate.   Data Reviewed: I have personally reviewed following labs and imaging studies  CBC:  Recent Labs Lab 03/24/16 1526 03/26/16 0326  WBC 5.1 6.2  HGB 10.5* 9.6*  HCT 33.6* 30.0*  MCV 100.6* 100.3*  PLT 157 270*   Basic Metabolic Panel:  Recent Labs Lab 03/24/16 1526 03/25/16 0412 03/26/16 0326  NA 137 137 138  K 5.0 4.9 5.3*  CL 97* 98* 98*  CO2 '31 30 29  '$ GLUCOSE 92 83 120*  BUN 26* 43* 65*  CREATININE 4.61* 6.11* 8.37*  CALCIUM 9.3 9.3 10.3   GFR: Estimated Creatinine Clearance: 9.7 mL/min (by C-G formula based on Cr of 8.37). Liver Function Tests:  Recent Labs Lab 03/24/16 1526  AST 30  ALT 24  ALKPHOS 78  BILITOT 0.7  PROT 7.4  ALBUMIN 3.4*   No results for input(s): LIPASE, AMYLASE in the last 168 hours. No results for input(s): AMMONIA in the last 168 hours. Coagulation Profile:  Recent Labs Lab 03/24/16 1858 03/25/16 0412 03/26/16 0326  INR 2.15* 1.91* 2.26*   Cardiac Enzymes:  Recent Labs Lab 03/24/16 1526 03/24/16 2136 03/25/16 0412 03/25/16 1007  TROPONINI 0.07* 0.07*  0.08* 0.07*   BNP (last 3 results) No results for input(s): PROBNP in the last 8760 hours. HbA1C: No results for input(s): HGBA1C in the last 72 hours. CBG: No results for input(s): GLUCAP in the last 168 hours. Lipid Profile: No results for input(s): CHOL, HDL, LDLCALC, TRIG, CHOLHDL, LDLDIRECT in the last 72 hours. Thyroid Function Tests: No results for input(s): TSH, T4TOTAL, FREET4, T3FREE, THYROIDAB in the last 72 hours. Anemia Panel: No results for input(s): VITAMINB12, FOLATE, FERRITIN, TIBC, IRON, RETICCTPCT in the last 72 hours. Urine analysis: No results found for: COLORURINE, APPEARANCEUR, LABSPEC, PHURINE, GLUCOSEU, HGBUR, BILIRUBINUR, KETONESUR, PROTEINUR, UROBILINOGEN, NITRITE, LEUKOCYTESUR Sepsis Labs: '@LABRCNTIP'$ (procalcitonin:4,lacticidven:4)   )No results found for this or any previous visit (from the past 240 hour(s)).    Radiology Studies: Dg Chest 2  View 03/24/2016  Stable cardiomegaly with vascular congestion Chronic background COPD/ emphysema and parenchymal scarring Thoracic aortic atherosclerosis No superimposed definite acute process by plain radiography.   Scheduled Meds: . atorvastatin  20 mg Oral q1800  . calcium acetate  4,002 mg Oral TID WC  . citalopram  20 mg Oral QHS  . clopidogrel  75 mg Oral Daily  . docusate sodium  300 mg Oral BID  . famotidine  20 mg Oral Daily  . folic acid  1 mg Oral Daily  . gabapentin  300 mg Oral QHS  . metoprolol tartrate  25 mg Oral BID  . multivitamin  1 tablet Oral Daily  . sodium chloride flush  3 mL Intravenous Q12H  . [START ON 03/27/2016] warfarin  3.75 mg Oral Once per day on Tue Sat  . warfarin  7.5 mg Oral Once per day on Sun Mon Wed Thu Fri  . Warfarin - Pharmacist Dosing Inpatient   Does not apply q1800   Continuous Infusions:      Time spent: 25 minutes  Greater than 50% of the time spent on counseling and coordinating the care.   Leisa Lenz, MD [facility] Pager [phone]  If  7PM-7AM, please contact night-coverage [email or web address] Password [facility] 03/26/2016, 8:52 AM

## 2016-03-26 NOTE — Progress Notes (Signed)
RT NOTE: ? ?Pt refuses CPAP.  ?

## 2016-03-26 NOTE — Procedures (Signed)
I was present at this dialysis session. I have reviewed the session itself and made appropriate changes.   Filed Weights   03/24/16 2205 03/25/16 0613 03/26/16 0608  Weight: 83.598 kg (184 lb 4.8 oz) 83.416 kg (183 lb 14.4 oz) 84.278 kg (185 lb 12.8 oz)     Recent Labs Lab 03/26/16 0326  NA 138  K 5.3*  CL 98*  CO2 29  GLUCOSE 120*  BUN 65*  CREATININE 8.37*  CALCIUM 10.3     Recent Labs Lab 03/24/16 1526 03/26/16 0326  WBC 5.1 6.2  HGB 10.5* 9.6*  HCT 33.6* 30.0*  MCV 100.6* 100.3*  PLT 157 147*    Scheduled Meds: . atorvastatin  20 mg Oral q1800  . calcitRIOL  0.25 mcg Oral Daily  . calcium acetate  4,002 mg Oral TID WC  . citalopram  20 mg Oral QHS  . clopidogrel  75 mg Oral Daily  . darbepoetin (ARANESP) injection - DIALYSIS  100 mcg Intravenous Q Fri-HD  . docusate sodium  300 mg Oral BID  . famotidine  20 mg Oral Daily  . [START ON 03/31/2016] ferric gluconate (FERRLECIT/NULECIT) IV  62.5 mg Intravenous Q Wed-HD  . folic acid  1 mg Oral Daily  . gabapentin  300 mg Oral QHS  . metoprolol tartrate  25 mg Oral BID  . multivitamin  1 tablet Oral Daily  . sodium chloride flush  3 mL Intravenous Q12H  . [START ON 03/27/2016] warfarin  3.75 mg Oral Once per day on Tue Sat  . warfarin  7.5 mg Oral Once per day on Sun Mon Wed Thu Fri  . Warfarin - Pharmacist Dosing Inpatient   Does not apply q1800   Continuous Infusions:  PRN Meds:.sodium chloride, acetaminophen, hydrALAZINE, ipratropium-albuterol, nitroGLYCERIN, ondansetron (ZOFRAN) IV, sodium chloride flush   Donetta Potts,  MD 03/26/2016, 12:46 PM

## 2016-03-26 NOTE — Progress Notes (Signed)
1000 Metropolol was not given  Pre hemodialysis as part of pre dialysis protocol.Had the pt understood . Verified with Hinton Dyer , HD  She refused to had Metropolol be given that may cause BP to drop down .  1700 Pt came back from dialysis with reported BPS 110 BPS insisted that metropolol will be next given at 1000 pm as ordered   1900 report given to RN for tonight . And be reminded that metropolol needs to be as scheduled if the vs warrants

## 2016-03-26 NOTE — Consult Note (Signed)
      ESRD Pt On HD MWF ( [facility]) in Observation  needing HD / cos SOB with exertion / attempt UF to lower EDW/ Full Consult Note If changed from Obs to [place].   Ernest Haber, PA-C [place] [phone] 03/26/2016,11:54 AM       I have seen and examined this patient and agree with plan as outlined by Ernest Haber, PA-C.  Has had some dizziness as an outpatient with challenging EDW so will see how he responds. Broadus John A Rhylee Nunn,MD 03/26/2016 12:16 PM

## 2016-03-26 NOTE — Progress Notes (Signed)
ANTICOAGULATION CONSULT NOTE - Follow Up Consult  Pharmacy Consult for warfarin Indication: atrial fibrillation  Allergies  Allergen Reactions  . Diphenhydramine Itching    Only with IV doses. Tolerates oral.  . Penicillins Other (See Comments)    migraine  . Adhesive [Tape] Rash    Please use paper tape  . Amoxicillin Rash    migraine  . Other Rash    Please use paper tape  . Penicillin G Rash    headache    Patient Measurements: Height: '5\' 10"'$  (177.8 cm) Weight: 186 lb 4.6 oz (84.5 kg) (standing weight) IBW/kg (Calculated) : 73  Vital Signs: Temp: 97.4 F (36.3 C) (07/21 1229) Temp Source: Oral (07/21 1229) BP: 155/93 mmHg (07/21 1300) Pulse Rate: 60 (07/21 1300)  Labs:  Recent Labs  03/24/16 1526 03/24/16 1858 03/24/16 2136 03/25/16 0412 03/25/16 1007 03/26/16 0326  HGB 10.5*  --   --   --   --  9.6*  HCT 33.6*  --   --   --   --  30.0*  PLT 157  --   --   --   --  147*  LABPROT  --  23.8*  --  21.8*  --  24.7*  INR  --  2.15*  --  1.91*  --  2.26*  CREATININE 4.61*  --   --  6.11*  --  8.37*  TROPONINI 0.07*  --  0.07* 0.08* 0.07*  --     Estimated Creatinine Clearance: 9.7 mL/min (by C-G formula based on Cr of 8.37).  Assessment: 60 year old male on Coumadin PTA for Afib INR therapeutic  Goal of Therapy:  INR 2-3 Monitor platelets by anticoagulation protocol: Yes   Plan:  - Continue home warfarin regimen and give 7.'5mg'$  tonight x1 - Monitor INR, H/H/Plts   Thank you Anette Guarneri, PharmD [phone] 03/26/2016 1:06 PM

## 2016-03-27 DIAGNOSIS — Z951 Presence of aortocoronary bypass graft: Secondary | ICD-10-CM | POA: Diagnosis not present

## 2016-03-27 DIAGNOSIS — J439 Emphysema, unspecified: Secondary | ICD-10-CM | POA: Diagnosis not present

## 2016-03-27 DIAGNOSIS — I132 Hypertensive heart and chronic kidney disease with heart failure and with stage 5 chronic kidney disease, or end stage renal disease: Secondary | ICD-10-CM | POA: Diagnosis not present

## 2016-03-27 DIAGNOSIS — I2510 Atherosclerotic heart disease of native coronary artery without angina pectoris: Secondary | ICD-10-CM | POA: Diagnosis not present

## 2016-03-27 DIAGNOSIS — R7989 Other specified abnormal findings of blood chemistry: Secondary | ICD-10-CM | POA: Diagnosis not present

## 2016-03-27 LAB — CBC
HCT: 32.9 % — ABNORMAL LOW (ref 39.0–52.0)
Hemoglobin: 10.4 g/dL — ABNORMAL LOW (ref 13.0–17.0)
MCH: 31.7 pg (ref 26.0–34.0)
MCHC: 31.6 g/dL (ref 30.0–36.0)
MCV: 100.3 fL — ABNORMAL HIGH (ref 78.0–100.0)
PLATELETS: 153 10*3/uL (ref 150–400)
RBC: 3.28 MIL/uL — ABNORMAL LOW (ref 4.22–5.81)
RDW: 15.7 % — AB (ref 11.5–15.5)
WBC: 6.4 10*3/uL (ref 4.0–10.5)

## 2016-03-27 LAB — BASIC METABOLIC PANEL
Anion gap: 10 (ref 5–15)
BUN: 31 mg/dL — AB (ref 6–20)
CALCIUM: 9.2 mg/dL (ref 8.9–10.3)
CHLORIDE: 96 mmol/L — AB (ref 101–111)
CO2: 31 mmol/L (ref 22–32)
CREATININE: 5.65 mg/dL — AB (ref 0.61–1.24)
GFR, EST AFRICAN AMERICAN: 11 mL/min — AB (ref >60)
GFR, EST NON AFRICAN AMERICAN: 10 mL/min — AB (ref >60)
Glucose, Bld: 87 mg/dL (ref 65–99)
Potassium: 4.6 mmol/L (ref 3.5–5.1)
SODIUM: 137 mmol/L (ref 135–145)

## 2016-03-27 LAB — PROTIME-INR
INR: 2.29 — AB (ref 0.00–1.49)
PROTHROMBIN TIME: 25 s — AB (ref 11.6–15.2)

## 2016-03-27 MED ORDER — CALCITRIOL 0.25 MCG PO CAPS: 0.2500 ug | ORAL_CAPSULE | Freq: Every day | ORAL | Status: DC

## 2016-03-27 NOTE — Discharge Instructions (Signed)
Hypertension Hypertension, commonly called high blood pressure, is when the force of blood pumping through your arteries is too strong. Your arteries are the blood vessels that carry blood from your heart throughout your body. A blood pressure reading consists of a higher number over a lower number, such as 110/72. The higher number (systolic) is the pressure inside your arteries when your heart pumps. The lower number (diastolic) is the pressure inside your arteries when your heart relaxes. Ideally you want your blood pressure below 120/80. Hypertension forces your heart to work harder to pump blood. Your arteries may become narrow or stiff. Having untreated or uncontrolled hypertension can cause heart attack, stroke, kidney disease, and other problems. RISK FACTORS Some risk factors for high blood pressure are controllable. Others are not.  Risk factors you cannot control include:   Race. You may be at higher risk if you are African American.  Age. Risk increases with age.  Gender. Men are at higher risk than women before age 45 years. After age 65, women are at higher risk than men. Risk factors you can control include:  Not getting enough exercise or physical activity.  Being overweight.  Getting too much fat, sugar, calories, or salt in your diet.  Drinking too much alcohol. SIGNS AND SYMPTOMS Hypertension does not usually cause signs or symptoms. Extremely high blood pressure (hypertensive crisis) may cause headache, anxiety, shortness of breath, and nosebleed. DIAGNOSIS To check if you have hypertension, your health care provider will measure your blood pressure while you are seated, with your arm held at the level of your heart. It should be measured at least twice using the same arm. Certain conditions can cause a difference in blood pressure between your right and left arms. A blood pressure reading that is higher than normal on one occasion does not mean that you need treatment. If  it is not clear whether you have high blood pressure, you may be asked to return on a different day to have your blood pressure checked again. Or, you may be asked to monitor your blood pressure at home for 1 or more weeks. TREATMENT Treating high blood pressure includes making lifestyle changes and possibly taking medicine. Living a healthy lifestyle can help lower high blood pressure. You may need to change some of your habits. Lifestyle changes may include:  Following the DASH diet. This diet is high in fruits, vegetables, and whole grains. It is low in salt, red meat, and added sugars.  Keep your sodium intake below 2,300 mg per day.  Getting at least 30-45 minutes of aerobic exercise at least 4 times per week.  Losing weight if necessary.  Not smoking.  Limiting alcoholic beverages.  Learning ways to reduce stress. Your health care provider may prescribe medicine if lifestyle changes are not enough to get your blood pressure under control, and if one of the following is true:  You are 18-59 years of age and your systolic blood pressure is above 140.  You are 60 years of age or older, and your systolic blood pressure is above 150.  Your diastolic blood pressure is above 90.  You have diabetes, and your systolic blood pressure is over 140 or your diastolic blood pressure is over 90.  You have kidney disease and your blood pressure is above 140/90.  You have heart disease and your blood pressure is above 140/90. Your personal target blood pressure may vary depending on your medical conditions, your age, and other factors. HOME CARE INSTRUCTIONS    Have your blood pressure rechecked as directed by your health care provider.   Take medicines only as directed by your health care provider. Follow the directions carefully. Blood pressure medicines must be taken as prescribed. The medicine does not work as well when you skip doses. Skipping doses also puts you at risk for  problems.  Do not smoke.   Monitor your blood pressure at home as directed by your health care provider. SEEK MEDICAL CARE IF:   You think you are having a reaction to medicines taken.  You have recurrent headaches or feel dizzy.  You have swelling in your ankles.  You have trouble with your vision. SEEK IMMEDIATE MEDICAL CARE IF:  You develop a severe headache or confusion.  You have unusual weakness, numbness, or feel faint.  You have severe chest or abdominal pain.  You vomit repeatedly.  You have trouble breathing. MAKE SURE YOU:   Understand these instructions.  Will watch your condition.  Will get help right away if you are not doing well or get worse.   This information is not intended to replace advice given to you by your health care provider. Make sure you discuss any questions you have with your health care provider.   Document Released: 08/23/2005 Document Revised: 01/07/2015 Document Reviewed: 06/15/2013 Elsevier Interactive Patient Education 2016 [employer]  

## 2016-03-27 NOTE — Discharge Summary (Signed)
Physician Discharge Summary  [name] EYC:[id number] DOB: [birth year] DOA: 03/24/2016  PCP: Teressa Lower, MD  Admit date: 03/24/2016 Discharge date: 03/27/2016  Recommendations for Outpatient Follow-up:  1. No changes in medications on discharge.   Discharge Diagnoses:  Principal Problem:   Diastolic CHF, acute on chronic (HCC) Active Problems:   Multiple sclerosis (HCC)   OSA -not compliant with C-pap   PAF (paroxysmal atrial fibrillation) (HCC)   ESRD on hemodialysis   Essential (primary) hypertension   CAD- s/p CABG in Aug 2014 with Y SVG to LAD & D2; NSTEMI 01/2015 [facility] s/p DES to LCx and BMS to SVG-LAD, NSTEMI 05/2015 non-flow limiting FFR of Cx, STEMI 02/2016 s/p angioplasty/DES to SVG-LAD   Anemia   Hx of CABG x 2 2014   Emphysema/COPD ([place])   Elevated troponin   SOB (shortness of breath)   Left leg swelling   Macrocytic anemia    Discharge Condition: stable   Diet recommendation: as tolerated   History of present illness:  60 y.o. male with past medical history significant for end-stage renal disease with failed transplant now on HD MWF, paroxysmal atrial fibrillation on anticoagulation with Coumadin, chronic diastolic CHF with last 2-D echo June 2017 with preserved ejection fraction, aortic disease status post AVR, and CAD with STEMI attributed to acute occlusion of SVG-LAD treated with DES. Patient presented to [name] for worsening shortness of breath for past 2 weeks prior to this admission.  Patient apparently being considered for second renal transplant however he had to be taken off of Plavix for this to happen. When patient was taken off of Plavix he unfortunately developed acute chest pain and was found to have STEMI, managed with drug-eluting stent. He was then discharged back on Plavix and aspirin and Coumadin. Aspirin was subsequently discontinued 2 weeks later and currently he is only on Plavix and Coumadin.  Patient has been doing well after that  hospitalization but over past 2 weeks he noticed progressive shortness of breath. No significant leg swelling and he reports his left leg has chronically been bigger than the right leg. No reports of cough or fevers or chest pains or palpitations.  Patient was hemodynamically stable in ED. His oxygen saturation was in the low 90s on room air. EKG showed normal sinus rhythm with low voltage QRS and incomplete right bundle branch block. Chest x-ray showed stable cardiomegaly with vascular congestion with chronic emphysema and parenchymal scarring. His blood work was notable for creatinine of 4.61, hemoglobin 10.5. INR was 2.15. Troponin level mildly elevated at 0.07. BNP was elevated at 1785. Cardiology has seen the patient in consultation.  Hospital Course:    Assessment & Plan:  Principal Problem:  Diastolic CHF, acute on chronic (HCC) - Last 2-D echo in June 2017 showed preserved ejection fraction - BNP is 1785 on this admission - 2 D ECHO on this admission showed EF 50% with grade 2 diastolic dysfunction  - LE doppler negative for DVT - Appreciate cardio input; manage weight per renal  Active Problems:  PAF (paroxysmal atrial fibrillation) (HCC) - CHA2DS2 vasc score 3 (htn, CHF, vascular history) - Continue coumadin - Rate controlled with metoprolol   ESRD on hemodialysis - ON MWF - Management per renal   Essential hypertension - Continue metoprolol 25 mg twice daily   CAD- s/p CABG in Aug 2014 with Y SVG to LAD & D2; NSTEMI 01/2015 [facility] s/p DES to LCx and BMS to SVG-LAD, NSTEMI 05/2015 non-flow limiting FFR of Cx, STEMI  02/2016 s/p angioplasty/DES to SVG-LAD / Mild troponin elevation  - Mild troponin elevation likely secondary to demand ischemia from acute decompensated CHF as well as end-stage renal disease  - Continue plavix    Anemia of chronic disease - Related to history of fifth H renal disease - Hemoglobin stable   Emphysema/COPD (HCC) - Stable respiratory  status   Dyslipidemia - Continue statin therapy   DVT prophylaxis: On anticoagulation with Coumadin Code Status: full code  Family Communication: no family at the bedside this am   Consultants:   Cardio  Procedures:   LLE doppler 03/25/2016 - No DVT  2 D EHCO 03/25/2016 - EF 55% with grade 2 diastolic dysfunction  Antimicrobials:   None   Signed:  Leisa Lenz, MD  [facility] 03/27/2016, 12:38 PM  Pager #: [phone]  Time spent in minutes: less than 30 minutes   Discharge Exam: Filed Vitals:   03/26/16 2151 03/27/16 0608  BP: 124/83 121/82  Pulse: 72 67  Temp: 98.3 F (36.8 C) 97.8 F (36.6 C)  Resp: 16 17   Filed Vitals:   03/26/16 1630 03/26/16 1639 03/26/16 2151 03/27/16 0608  BP: 105/71 111/53 124/83 121/82  Pulse: 71 72 72 67  Temp:  97.8 F (36.6 C) 98.3 F (36.8 C) 97.8 F (36.6 C)  TempSrc:  Oral Oral Oral  Resp: '17 18 16 17  '$ Height:      Weight:  81 kg (178 lb 9.2 oz)  81.874 kg (180 lb 8 oz)  SpO2:   96% 95%    General: Pt is alert, follows commands appropriately, not in acute distress Cardiovascular: Regular rate and rhythm, S1/S2 + Respiratory: Clear to auscultation bilaterally, no wheezing, no crackles, no rhonchi Abdominal: Soft, non tender, non distended, bowel sounds +, no guarding Extremities: no cyanosis, pulses palpable bilaterally DP and PT Neuro: Grossly nonfocal  Discharge Instructions  Discharge Instructions    Call MD for:  difficulty breathing, headache or visual disturbances    Complete by:  As directed      Call MD for:  persistant dizziness or light-headedness    Complete by:  As directed      Call MD for:  persistant nausea and vomiting    Complete by:  As directed      Call MD for:  severe uncontrolled pain    Complete by:  As directed      Diet - low sodium heart healthy    Complete by:  As directed      Increase activity slowly    Complete by:  As directed             Medication List     TAKE these medications        acetaminophen 500 MG tablet  Commonly known as:  TYLENOL  Take 1,000 mg by mouth every 6 (six) hours as needed for pain or fever.     atorvastatin 40 MG tablet  Commonly known as:  LIPITOR  Take 1 tablet (40 mg total) by mouth every evening.     calcitRIOL 0.25 MCG capsule  Commonly known as:  ROCALTROL  Take 1 capsule (0.25 mcg total) by mouth daily.     calcium acetate 667 MG capsule  Commonly known as:  PHOSLO  Take 4,002 mg by mouth 3 (three) times daily with meals.     citalopram 20 MG tablet  Commonly known as:  CELEXA  Take 20 mg by mouth at bedtime.  clopidogrel 75 MG tablet  Commonly known as:  PLAVIX  Take 1 tablet (75 mg total) by mouth daily.     docusate sodium 100 MG capsule  Commonly known as:  COLACE  Take 300 mg by mouth 2 (two) times daily.     folic acid 1 MG tablet  Commonly known as:  FOLVITE  Take 1 mg by mouth daily.     gabapentin 300 MG capsule  Commonly known as:  NEURONTIN  Take 300 mg by mouth at bedtime.     metoprolol tartrate 25 MG tablet  Commonly known as:  LOPRESSOR  Take 1 tablet (25 mg total) by mouth 2 (two) times daily.     multivitamin Tabs tablet  Take 1 tablet by mouth daily.     nitroGLYCERIN 0.4 MG SL tablet  Commonly known as:  NITROSTAT  Place 0.4 mg under the tongue every 5 (five) minutes as needed for chest pain (Take one tabe every 5-10 minutes for chest pain. Once you have taken three tabs you need to notify your MD or go to ER if chest pain not gone.).     PROAIR HFA 108 (90 Base) MCG/ACT inhaler  Generic drug:  albuterol  Inhale 1-2 puffs into the lungs every 6 (six) hours as needed for shortness of breath.     ranitidine 150 MG tablet  Commonly known as:  ZANTAC  Take 150 mg by mouth daily.     sodium polystyrene 15 GM/60ML suspension  Commonly known as:  KAYEXALATE  Take 15 g by mouth See admin instructions. Only take 15 g on Sun / Tues / Thurs / Sat (non dialysis days)      warfarin 7.5 MG tablet  Commonly known as:  COUMADIN  Take 3.75-7.5 mg by mouth as directed. Takes 1/2 tab on Tues, Sat 3.'75mg'$  Takes 1 tab all other days           Follow-up Information    Follow up with DOUGH,ROBERT, MD. Schedule an appointment as soon as possible for a visit in 1 week.   Specialty:  Family Medicine   Why:  Follow up appt after recent hospitalization   Contact information:   [place] [id number] [phone]        The results of significant diagnostics from this hospitalization (including imaging, microbiology, ancillary and laboratory) are listed below for reference.    Significant Diagnostic Studies: Dg Chest 2 View  03/24/2016  CLINICAL DATA:  Shortness of breath with exertion for 2 weeks. Previous coronary bypass. Recent coronary stent 1 month ago. EXAM: CHEST  2 VIEW COMPARISON:  02/15/2016 FINDINGS: Previous median sternotomy. Upper sternotomy wires are broken with a similar appearance. cardiomegaly with central vascular congestion. No definite CHF pattern or focal pneumonia. Mild background COPD/ emphysema pattern with parenchymal scarring, similar to prior study. No effusion or pneumothorax. Trachea midline. Previous aortic valve replacement present. IMPRESSION: Stable cardiomegaly with vascular congestion Chronic background COPD/ emphysema and parenchymal scarring Thoracic aortic atherosclerosis No superimposed definite acute process by plain radiography Electronically Signed   By: Jerilynn Mages.  Shick M.D.   On: 03/24/2016 16:38    Microbiology: No results found for this or any previous visit (from the past 240 hour(s)).   Labs: Basic Metabolic Panel:  Recent Labs Lab 03/24/16 1526 03/25/16 0412 03/26/16 0326 03/26/16 1252 03/27/16 0234  NA 137 137 138 135 137  K 5.0 4.9 5.3* 5.0 4.6  CL 97* 98* 98* 99* 96*  CO2 31 30 29  26 31  GLUCOSE 92 83 120* 97 87  BUN 26* 43* 65* 69* 31*  CREATININE 4.61* 6.11* 8.37* 8.88* 5.65*  CALCIUM 9.3  9.3 10.3 10.5* 9.2  PHOS  --   --   --  6.5*  --    Liver Function Tests:  Recent Labs Lab 03/24/16 1526 03/26/16 1252  AST 30  --   ALT 24  --   ALKPHOS 78  --   BILITOT 0.7  --   PROT 7.4  --   ALBUMIN 3.4* 3.3*   No results for input(s): LIPASE, AMYLASE in the last 168 hours. No results for input(s): AMMONIA in the last 168 hours. CBC:  Recent Labs Lab 03/24/16 1526 03/26/16 0326 03/27/16 0234  WBC 5.1 6.2 6.4  HGB 10.5* 9.6* 10.4*  HCT 33.6* 30.0* 32.9*  MCV 100.6* 100.3* 100.3*  PLT 157 147* 153   Cardiac Enzymes:  Recent Labs Lab 03/24/16 1526 03/24/16 2136 03/25/16 0412 03/25/16 1007  TROPONINI 0.07* 0.07* 0.08* 0.07*   BNP: BNP (last 3 results)  Recent Labs  03/24/16 1526  BNP 1785.3*    ProBNP (last 3 results) No results for input(s): PROBNP in the last 8760 hours.  CBG: No results for input(s): GLUCAP in the last 168 hours.

## 2016-03-27 NOTE — Progress Notes (Signed)
Pt d/c home per MD order, pt VSS, pt and family verbalized understanding of d/c instructions, prescriptions given, all questions answered

## 2016-03-27 NOTE — Progress Notes (Signed)
ANTICOAGULATION CONSULT NOTE - Follow Up Consult  Pharmacy Consult for warfarin Indication: atrial fibrillation  Allergies  Allergen Reactions  . Diphenhydramine Itching    Only with IV doses. Tolerates oral.  . Penicillins Other (See Comments)    migraine  . Adhesive [Tape] Rash    Please use paper tape  . Amoxicillin Rash    migraine  . Other Rash    Please use paper tape  . Penicillin G Rash    headache    Patient Measurements: Height: '5\' 10"'$  (177.8 cm) Weight: 180 lb 8 oz (81.874 kg) (b scale) IBW/kg (Calculated) : 73  Vital Signs: Temp: 97.8 F (36.6 C) (07/22 0608) Temp Source: Oral (07/22 0608) BP: 121/82 mmHg (07/22 0608) Pulse Rate: 67 (07/22 0608)  Labs:  Recent Labs  03/24/16 1526  03/24/16 2136 03/25/16 0412 03/25/16 1007 03/26/16 0326 03/26/16 1252 03/27/16 0234  HGB 10.5*  --   --   --   --  9.6*  --  10.4*  HCT 33.6*  --   --   --   --  30.0*  --  32.9*  PLT 157  --   --   --   --  147*  --  153  LABPROT  --   < >  --  21.8*  --  24.7*  --  25.0*  INR  --   < >  --  1.91*  --  2.26*  --  2.29*  CREATININE 4.61*  --   --  6.11*  --  8.37* 8.88* 5.65*  TROPONINI 0.07*  --  0.07* 0.08* 0.07*  --   --   --   < > = values in this interval not displayed.  Estimated Creatinine Clearance: 14.4 mL/min (by C-G formula based on Cr of 5.65).  Assessment: 60 year old male on Coumadin PTA for Afib INR therapeutic  Goal of Therapy:  INR 2-3 Monitor platelets by anticoagulation protocol: Yes   Plan:  - Continue home warfarin regimen and give 3.'5mg'$  tonight x1 - Daily INR  Continue home regimen at discharge 7.5 mg po daily excpet 3.75 mg  Thank you Anette Guarneri, PharmD [phone] 03/27/2016 12:10 PM

## 2016-03-31 ENCOUNTER — Ambulatory Visit (INDEPENDENT_AMBULATORY_CARE_PROVIDER_SITE_OTHER): Payer: Medicare Other | Admitting: Cardiovascular Disease

## 2016-03-31 ENCOUNTER — Encounter: Payer: Self-pay | Admitting: Cardiovascular Disease

## 2016-03-31 ENCOUNTER — Ambulatory Visit (INDEPENDENT_AMBULATORY_CARE_PROVIDER_SITE_OTHER): Payer: Medicare Other | Admitting: *Deleted

## 2016-03-31 VITALS — BP 102/64 | HR 56 | Ht 70.0 in | Wt 182.8 lb

## 2016-03-31 DIAGNOSIS — I214 Non-ST elevation (NSTEMI) myocardial infarction: Secondary | ICD-10-CM | POA: Diagnosis not present

## 2016-03-31 DIAGNOSIS — I480 Paroxysmal atrial fibrillation: Secondary | ICD-10-CM

## 2016-03-31 DIAGNOSIS — I4891 Unspecified atrial fibrillation: Secondary | ICD-10-CM

## 2016-03-31 DIAGNOSIS — Z7901 Long term (current) use of anticoagulants: Secondary | ICD-10-CM | POA: Diagnosis not present

## 2016-03-31 DIAGNOSIS — I10 Essential (primary) hypertension: Secondary | ICD-10-CM

## 2016-03-31 DIAGNOSIS — Z992 Dependence on renal dialysis: Secondary | ICD-10-CM

## 2016-03-31 DIAGNOSIS — I2510 Atherosclerotic heart disease of native coronary artery without angina pectoris: Secondary | ICD-10-CM | POA: Diagnosis not present

## 2016-03-31 DIAGNOSIS — Z5181 Encounter for therapeutic drug level monitoring: Secondary | ICD-10-CM

## 2016-03-31 DIAGNOSIS — N186 End stage renal disease: Secondary | ICD-10-CM

## 2016-03-31 DIAGNOSIS — I4892 Unspecified atrial flutter: Secondary | ICD-10-CM | POA: Diagnosis not present

## 2016-03-31 DIAGNOSIS — I359 Nonrheumatic aortic valve disorder, unspecified: Secondary | ICD-10-CM

## 2016-03-31 DIAGNOSIS — Z9861 Coronary angioplasty status: Secondary | ICD-10-CM

## 2016-03-31 DIAGNOSIS — I340 Nonrheumatic mitral (valve) insufficiency: Secondary | ICD-10-CM

## 2016-03-31 LAB — POCT INR: INR: 2.4

## 2016-03-31 NOTE — Progress Notes (Signed)
Chief Complaint  Patient presents with  . Follow-up  . 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 I met him in 2014 when I performed his heart cath at [facility] following cardiac arrest after a VATS for lung cancer. He was found to have moderate CAD at that time. He failed medical management and then underwent CABG and AVR at [facility]. He had been followed at [facility] since then. In May 2016, he was admitted for a NSTEMI at [facility] and underwent PCI of the left circumflex (Resolute drug-eluting stent) and SVG to LAD (Herculink bare metal stent). Echocardiogram done prior to admission in 04/2015 showed an EF of 84-16%, grade 1 diastolic dysfunction, mild to moderate aortic regurgitation and mild MS/MR. Per notes he has prior history of PAF during HD but very short lived. He was admitted to [facility] 9/14-9/20/16, prompted by development of recurrent symptomatic atrial arrhythmias during HD session. During that admission, he developed SVT during HD and on arrival to ER was in atrial flutter with variable block (appeared to be atypical flutter) with RVR and converted spontaneously to NSR.He apparently also had paroxysms of atrial fib during dialysis sessions as well. He ruled in for NSTEMI with peak troponin 1.54 and underwent a nuclear stress test that was abnormal with apical ischemia. He underwent LHC 05/26/15 which showed 100% mid LAD stenosis, 99% Diagonal stenosis, patent SVG to LAD with ostial 40% stenosis, 65% mid Circumflex stent restenosis with FFR of 0.87 in the Circumflex. With the nonischemic FFR of the circumflex  lesion, it was felt that this was not a flow-limiting lesion that could explain his high risk stress test. This is the distribution that his stress test showed positivity during his non-STEMI in May. It was felt possible that this is simply infarct with peri-infarct ischemia in the setting of atrial fibrillation. It was also felt that his low EF by stress test was due to possible nuclear gating error. No coronary intervention was required. Continued medical therapy was recommended. Echo 05/27/15: EF 55-60%, no RWMA, mild AS/mild AI, mod MS, severely dilated LA.  Nuclear stress test 11/2015 in prep for repeat transplant was low risk at [facility]. In 01/2016, he was cleared to stop Plavix but remained on ASA + Coumadin. Per notes, it was felt by the transplant team that he would need to be off Plavix to be considered for transplantation. He was admitted 02/16/16 with midsternal chest pain with radiation to his jaw/teeth. EKG showed anterior STEMI. LHC 02/15/16: acute occlusion of SVG-LAD at anastamosis, patent LCx with moderate restenosis, moderate RCA disease, patent Y graft to diagonal with moderate ostial stenosis, s/p angioplasty/DES to SVG-LAD extending into the native vessel - this was a difficult procedure c/b large right groin hematoma. 2D 02/15/16: EF 55-60%, HK basal mid ateroseptal myocardium and HK of apical anterior myocardium, calcified AV with mild AS, mild AI, calcified MV with mobility restriction of posterior leaflet with mild MR, mod dilated LAE, PASP 49mHg. His post-hospital course notable for NSVT, prominent bifascicular block on EKG post-cath, ABL anemia,  nocturnal hypoxia (known for patient), and episodic hypotension. Coumadin was held for several days and actually reversed due to supratherapeutic INR and progressive anemia. Hgb was 11.7 on admission and drifted down to a nadir of 7.9. He received 3 u PRBC total. CT abd pelvis ruled out retroperitoneal hemorrhage. Groin [place] was without pseudoaneurysm.  Hgb on day of DC was 10.3 and Coumadin was restarted. The plan was outlined for triple therapy initially and stopping aspirin 1-2 weeks after PCI, which Dr. Meda Coffee calculated 2 weeks at 02/28/16. Long term decisions will need to be made in 6 months to decide if it is safe to stop Plavix at some point - the patient is concerned that this time delay would exclude him from a kidney transplant (currently on the list), but our concern is that he developed STEMI only a week after discontinuation of Plavix. F/u Hgb 6/19 was 11.1.  He was admitted to [facility] 03/24/16 with volume overload. Echo 03/25/16 with preserved LVEF.moderate MR, bioprosthetic AVR. Grade 2 diastolic dysfunction.   The patient presents for follow-up today and is doing well. He denies any recurrent CP, SOB, or palpitations. His energy level is normal.   Primary Care Physician: Teressa Lower, MD   Past Medical History:  Diagnosis Date  . Acute blood loss anemia    a. 02/2016 due to groin hematoma. Rec 3 U PRBC.  Marland Kitchen Anemia   . Aortic heart valve prolapse 04/2013   a. s/p bioprosthetic AVR at time of CABG.  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 Bifascicular block   . CAD (coronary artery disease) 04/2013   a. 04/2013: s/p CABG x 2 (Y SVG -LAD & D2). b. 01/2015: NSTEMI s/p DES to LCx, BMS to SVG-LAD. c. NSTEMI 05/2015 during AF/AFL - non-flow limiting FFR. d. Low risk nuc 3/17. e. STEMI 6/17 after coming off Plavix, s/p DES to SVG-LAD into native vessel.  . Carotid artery disease ([place])    a. 1-39% stenosis bilaterally in 06/2015.   Marland Kitchen ESRD on hemodialysis 02/12/2012   a. 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 b. He had a renal Tx from 2008 to 2011, but subsequent rejection - Gets HD in [place], [place] on MWF schedule.    . Hematoma    a. Large right groin hematoma after cath 02/2016 with associated ABL anemia.  . Hypertension   . Multiple sclerosis ([place])   . Multiple sclerosis ([place])   . Nocturnal hypoxemia    a. Pt has  home o2 QHS.  . On home oxygen therapy    pt states he has not been wearing it  . PAF (paroxysmal atrial fibrillation) ([place])   . Paroxysmal atrial flutter ([place])    a. During 05/2015 admission - SVT, atrial flutter, and PAF.  Marland Kitchen Peripheral vascular disease ([place])    Cath in 01/2015 required 45 cm [place]  . Renal transplant failure and rejection   . 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 intolerant of bipap.  Marland Kitchen SVT (supraventricular tachycardia) ([place])   . Valvular heart disease    a. 2D echo 05/2015: EF 55-60%, no RWMA, mild AS/mild AI, mod MS, severely dilated LA.    Past Surgical History:  Procedure Laterality Date  . AV FISTULA PLACEMENT    . CARDIAC CATHETERIZATION N/A 05/26/2015   Procedure: Left Heart Cath and Coronary Angiography;  Surgeon: Leonie Man, MD;  Location: [facility];  Service: Cardiovascular;  Laterality: N/A;  . CARDIAC CATHETERIZATION N/A 05/26/2015   Procedure: Intravascular Pressure Wire/FFR Study;  Surgeon: Leonie Man, MD;  Location: [facility];  Service: Cardiovascular;  Laterality: N/A;  . CARDIAC CATHETERIZATION N/A 02/15/2016   Procedure: Left Heart Cath and Coronary Angiography;  Surgeon: Wellington Hampshire, MD;  Location: [facility];  Service: Cardiovascular;  Laterality: N/A;  . excised squamous cells at rectum  2006  . flash     flash pulmonary edema  . HERNIA REPAIR  07/2011  . KIDNEY TRANSPLANT  08/2007  . LEFT HEART CATHETERIZATION WITH CORONARY ANGIOGRAM N/A 01/09/2013   Procedure: LEFT HEART CATHETERIZATION WITH CORONARY ANGIOGRAM;  Surgeon: Burnell Blanks, MD;  Location: [facility] CATH LAB;  Service: Cardiovascular;  Laterality: N/A;    Current Outpatient Prescriptions  Medication Sig Dispense Refill  . acetaminophen (TYLENOL) 500 MG tablet Take 1,000 mg by mouth every 6 (six) hours as needed for pain or fever.    Marland Kitchen albuterol (PROAIR HFA) 108 (90 BASE) MCG/ACT inhaler  Inhale 1-2 puffs into the lungs every 6 (six) hours as needed for shortness of breath.     Marland Kitchen atorvastatin (LIPITOR) 40 MG tablet Take 40 mg by mouth daily. Pt takes 20 mg daily    . calcitRIOL (ROCALTROL) 0.25 MCG capsule Take 1 capsule (0.25 mcg total) by mouth daily. 30 capsule 0  . calcium acetate (PHOSLO) 667 MG capsule Take 4,002 mg by mouth 3 (three) times daily with meals.     . citalopram (CELEXA) 20 MG tablet Take 20 mg by mouth at bedtime.     . clopidogrel (PLAVIX) 75 MG tablet Take 1 tablet (75 mg total) by mouth daily. 90 tablet 3  . docusate sodium (COLACE) 100 MG capsule Take 300 mg by mouth 2 (two) times daily.    . folic acid (FOLVITE) 1 MG tablet Take 1 mg by mouth daily.    Marland Kitchen gabapentin (NEURONTIN) 300 MG capsule Take 300 mg by mouth at bedtime.     . metoprolol tartrate (LOPRESSOR) 25 MG tablet Take 1 tablet (25 mg total) by mouth 2 (two) times daily. 180 tablet 3  . multivitamin (RENA-VIT) TABS tablet Take 1 tablet by mouth daily. 30 tablet 1  . nitroGLYCERIN (NITROSTAT) 0.4 MG SL tablet Place 0.4 mg under the tongue every 5 (five) minutes as needed for chest pain (Take one tabe every 5-10 minutes for chest pain. Once you have taken three tabs you need to notify your MD or go to ER if chest pain not gone.).    Marland Kitchen ranitidine (ZANTAC) 150 MG tablet Take 150 mg by mouth daily.    . sodium polystyrene (KAYEXALATE) 15 GM/60ML suspension Take 15 g by mouth See admin instructions. Only take 15 g on Sun / Tues / Thurs / Sat (non dialysis days)    . warfarin (COUMADIN) 7.5 MG tablet Take 3.75-7.5 mg by mouth as directed. Takes 1/2 tab on Tues, Sat 3.68m Takes 1 tab all other days     No current facility-administered medications for this visit.     Allergies  Allergen Reactions  . Diphenhydramine Itching    Only with IV doses. Tolerates oral.  . Penicillins Other (See Comments)    migraine  . Adhesive [Tape] Rash    Please use paper tape  . Amoxicillin Rash    migraine  .  Other Rash    Please use paper tape  .  Penicillin G Rash    headache    Social History   Social History  . Marital status: Married    Spouse name: N/A  . Number of children: N/A  . Years of education: N/A   Occupational History  . Disabled    Social History Main Topics  . Smoking status: Former Smoker    Packs/day: 2.00    Years: 20.00    Types: Cigarettes    Quit date: 08/06/1998  . Smokeless tobacco: Not on file  . Alcohol use No  . Drug use: No  . Sexual activity: Not on file   Other Topics Concern  . Not on file   Social History Narrative   No history of premature CAD in either parents or siblings. However, his maternal grandfather and 2 maternal uncles had coronary artery disease.    Family History  Problem Relation Age of Onset  . Diabetes Father   . Heart failure Mother     started in 14s  . Lupus Sister   . Heart attack Maternal Grandfather   . Heart attack Maternal Uncle   . Heart attack Maternal Aunt   . Hypertension Maternal Aunt   . Stroke Neg Hx     Review of Systems:  As stated in the HPI and otherwise negative.   BP 102/64 (BP Location: Right Arm, Patient Position: Sitting, Cuff Size: Normal)   Pulse (!) 56   Ht 5' 10"  (1.778 m)   Wt 182 lb 12.8 oz (82.9 kg)   SpO2 97%   BMI 26.23 kg/m   Physical Examination: General: Well developed, well nourished, NAD  HEENT: OP clear, mucus membranes moist  SKIN: warm, dry. No rashes. Neuro: No focal deficits  Musculoskeletal: Muscle strength 5/5 all ext  Psychiatric: Mood and affect normal  Neck: No JVD, no carotid bruits, no thyromegaly, no lymphadenopathy.  Lungs:Clear bilaterally, no wheezes, rhonci, crackles Cardiovascular: Regular rate and rhythm. No murmurs, gallops or rubs. Abdomen:Soft. Bowel sounds present. Non-tender.  Extremities: No lower extremity edema. Pulses are 2 + in the bilateral DP/PT.  Echo 03/25/16: Left ventricle: The cavity size was normal. Wall thickness was   increased  in a pattern of mild LVH. Systolic function was normal.   The estimated ejection fraction was in the range of 50% to 55%.   Wall motion was normal; there were no regional wall motion   abnormalities. Features are consistent with a pseudonormal left   ventricular filling pattern, with concomitant abnormal relaxation   and increased filling pressure (grade 2 diastolic dysfunction).   Doppler parameters are consistent with high ventricular filling   pressure. - Aortic valve: A bioprosthesis was present. There was mild   regurgitation. - Mitral valve: Severely calcified annulus. There was moderate   regurgitation. - Left atrium: The atrium was severely dilated. - Right ventricle: The cavity size was mildly dilated. - Right atrium: The atrium was moderately dilated. - Pulmonary arteries: Systolic pressure was severely increased. PA   peak pressure: 72 mm Hg (S).  Impressions:  - Low normal LV systolic function; grade 2 diastolic dysfunction   with elevated LV filling pressure; biatrial enlargement; s/p AVR   with normal gradients and mild AI; severe MAC with moderate,   eccentric MR (may be underestimated; suggest TEE to further   assess if clinically indicated); mild RVE; mild TR with severely   elevated pulmonary pressure.   EKG:  EKG is not ordered today. The ekg ordered today demonstrates   Recent Labs: 03/24/2016:  ALT 24; B Natriuretic Peptide 1,785.3 03/27/2016: BUN 31; Creatinine, Ser 5.65; Hemoglobin 10.4; Platelets 153; Potassium 4.6; Sodium 137   Lipid Panel    Component Value Date/Time   CHOL 98 (L) 03/05/2016 1125   TRIG 143 03/05/2016 1125   HDL 32 (L) 03/05/2016 1125   CHOLHDL 3.1 03/05/2016 1125   VLDL 29 03/05/2016 1125   LDLCALC 37 03/05/2016 1125     Wt Readings from Last 3 Encounters:  03/31/16 182 lb 12.8 oz (82.9 kg)  03/27/16 180 lb 8 oz (81.9 kg)  02/26/16 187 lb 3.2 oz (84.9 kg)     Other studies Reviewed: Additional studies/ records that were  reviewed today include: . Review of the above records demonstrates:    Assessment and Plan:   1. Atrial fibrillation: Sinus today. Reports atrial fib at HD this am. Continue coumadin for anti-coagulation. Continue Lopressor for now. He can use an extra Lopressor if he has recurrent episodes.   2. CAD: Stable. He is s/p CABG and  PCI May 2016 at [facility] and most recently anterior STEMI after stopping Plavix due to occlusion of SVG to LAD. Plavix stopped so he could be placed on the renal transplant list. Continue Plavix, beta blocker and statin. No ASA due to use of coumadin along with Plavix.   3. Aortic valve disease: s/p bioprosthetic AVR. Mild AI by echo July 2017. He will continue to use antibiotic prophylaxis before dental visits.   4. Mitral stenosis: moderate by echo July 2017. Follow  5. HTN: BP well controlled. No changes.   6. ESRD: on HD  7. Carotid artery disease: Mild bilateral plaque by dopplers October 2016.   Extensive review of records today from hospital stay and visits last year.   Current medicines are reviewed at length with the patient today.  The patient does not have concerns regarding medicines.  The following changes have been made:  no change  Labs/ tests ordered today include:  No orders of the defined types were placed in this encounter.   Disposition:   FU with me in 6 months  Signed, Lauree Chandler, MD 03/31/2016 4:25 PM    [facility] [address], [place], [place]  [address] Phone: [phone]; Fax: [phone]

## 2016-03-31 NOTE — Patient Instructions (Signed)

## 2016-04-01 ENCOUNTER — Other Ambulatory Visit: Payer: Self-pay | Admitting: Cardiology

## 2016-04-01 DIAGNOSIS — B359 Dermatophytosis, unspecified: Secondary | ICD-10-CM | POA: Diagnosis not present

## 2016-04-05 DIAGNOSIS — T8612 Kidney transplant failure: Secondary | ICD-10-CM | POA: Diagnosis not present

## 2016-04-05 DIAGNOSIS — N186 End stage renal disease: Secondary | ICD-10-CM | POA: Diagnosis not present

## 2016-04-05 DIAGNOSIS — Z992 Dependence on renal dialysis: Secondary | ICD-10-CM | POA: Diagnosis not present

## 2016-04-07 DIAGNOSIS — N2581 Secondary hyperparathyroidism of renal origin: Secondary | ICD-10-CM | POA: Diagnosis not present

## 2016-04-07 DIAGNOSIS — N186 End stage renal disease: Secondary | ICD-10-CM | POA: Diagnosis not present

## 2016-04-07 DIAGNOSIS — E875 Hyperkalemia: Secondary | ICD-10-CM | POA: Diagnosis not present

## 2016-04-07 DIAGNOSIS — D631 Anemia in chronic kidney disease: Secondary | ICD-10-CM | POA: Diagnosis not present

## 2016-04-07 DIAGNOSIS — D509 Iron deficiency anemia, unspecified: Secondary | ICD-10-CM | POA: Diagnosis not present

## 2016-04-09 DIAGNOSIS — N186 End stage renal disease: Secondary | ICD-10-CM | POA: Diagnosis not present

## 2016-04-09 DIAGNOSIS — D509 Iron deficiency anemia, unspecified: Secondary | ICD-10-CM | POA: Diagnosis not present

## 2016-04-09 DIAGNOSIS — D631 Anemia in chronic kidney disease: Secondary | ICD-10-CM | POA: Diagnosis not present

## 2016-04-09 DIAGNOSIS — N2581 Secondary hyperparathyroidism of renal origin: Secondary | ICD-10-CM | POA: Diagnosis not present

## 2016-04-09 DIAGNOSIS — E875 Hyperkalemia: Secondary | ICD-10-CM | POA: Diagnosis not present

## 2016-04-12 DIAGNOSIS — E875 Hyperkalemia: Secondary | ICD-10-CM | POA: Diagnosis not present

## 2016-04-12 DIAGNOSIS — N2581 Secondary hyperparathyroidism of renal origin: Secondary | ICD-10-CM | POA: Diagnosis not present

## 2016-04-12 DIAGNOSIS — D631 Anemia in chronic kidney disease: Secondary | ICD-10-CM | POA: Diagnosis not present

## 2016-04-12 DIAGNOSIS — N186 End stage renal disease: Secondary | ICD-10-CM | POA: Diagnosis not present

## 2016-04-12 DIAGNOSIS — D509 Iron deficiency anemia, unspecified: Secondary | ICD-10-CM | POA: Diagnosis not present

## 2016-04-14 DIAGNOSIS — D509 Iron deficiency anemia, unspecified: Secondary | ICD-10-CM | POA: Diagnosis not present

## 2016-04-14 DIAGNOSIS — N2581 Secondary hyperparathyroidism of renal origin: Secondary | ICD-10-CM | POA: Diagnosis not present

## 2016-04-14 DIAGNOSIS — N186 End stage renal disease: Secondary | ICD-10-CM | POA: Diagnosis not present

## 2016-04-14 DIAGNOSIS — D631 Anemia in chronic kidney disease: Secondary | ICD-10-CM | POA: Diagnosis not present

## 2016-04-14 DIAGNOSIS — E875 Hyperkalemia: Secondary | ICD-10-CM | POA: Diagnosis not present

## 2016-04-16 DIAGNOSIS — D631 Anemia in chronic kidney disease: Secondary | ICD-10-CM | POA: Diagnosis not present

## 2016-04-16 DIAGNOSIS — N186 End stage renal disease: Secondary | ICD-10-CM | POA: Diagnosis not present

## 2016-04-16 DIAGNOSIS — E875 Hyperkalemia: Secondary | ICD-10-CM | POA: Diagnosis not present

## 2016-04-16 DIAGNOSIS — D509 Iron deficiency anemia, unspecified: Secondary | ICD-10-CM | POA: Diagnosis not present

## 2016-04-16 DIAGNOSIS — N2581 Secondary hyperparathyroidism of renal origin: Secondary | ICD-10-CM | POA: Diagnosis not present

## 2016-04-19 DIAGNOSIS — E875 Hyperkalemia: Secondary | ICD-10-CM | POA: Diagnosis not present

## 2016-04-19 DIAGNOSIS — D509 Iron deficiency anemia, unspecified: Secondary | ICD-10-CM | POA: Diagnosis not present

## 2016-04-19 DIAGNOSIS — N2581 Secondary hyperparathyroidism of renal origin: Secondary | ICD-10-CM | POA: Diagnosis not present

## 2016-04-19 DIAGNOSIS — N186 End stage renal disease: Secondary | ICD-10-CM | POA: Diagnosis not present

## 2016-04-19 DIAGNOSIS — D631 Anemia in chronic kidney disease: Secondary | ICD-10-CM | POA: Diagnosis not present

## 2016-04-21 DIAGNOSIS — N186 End stage renal disease: Secondary | ICD-10-CM | POA: Diagnosis not present

## 2016-04-21 DIAGNOSIS — D509 Iron deficiency anemia, unspecified: Secondary | ICD-10-CM | POA: Diagnosis not present

## 2016-04-21 DIAGNOSIS — N2581 Secondary hyperparathyroidism of renal origin: Secondary | ICD-10-CM | POA: Diagnosis not present

## 2016-04-21 DIAGNOSIS — E875 Hyperkalemia: Secondary | ICD-10-CM | POA: Diagnosis not present

## 2016-04-21 DIAGNOSIS — D631 Anemia in chronic kidney disease: Secondary | ICD-10-CM | POA: Diagnosis not present

## 2016-04-23 DIAGNOSIS — N186 End stage renal disease: Secondary | ICD-10-CM | POA: Diagnosis not present

## 2016-04-23 DIAGNOSIS — E875 Hyperkalemia: Secondary | ICD-10-CM | POA: Diagnosis not present

## 2016-04-23 DIAGNOSIS — D509 Iron deficiency anemia, unspecified: Secondary | ICD-10-CM | POA: Diagnosis not present

## 2016-04-23 DIAGNOSIS — D631 Anemia in chronic kidney disease: Secondary | ICD-10-CM | POA: Diagnosis not present

## 2016-04-23 DIAGNOSIS — N2581 Secondary hyperparathyroidism of renal origin: Secondary | ICD-10-CM | POA: Diagnosis not present

## 2016-04-26 DIAGNOSIS — D631 Anemia in chronic kidney disease: Secondary | ICD-10-CM | POA: Diagnosis not present

## 2016-04-26 DIAGNOSIS — N2581 Secondary hyperparathyroidism of renal origin: Secondary | ICD-10-CM | POA: Diagnosis not present

## 2016-04-26 DIAGNOSIS — N186 End stage renal disease: Secondary | ICD-10-CM | POA: Diagnosis not present

## 2016-04-26 DIAGNOSIS — E875 Hyperkalemia: Secondary | ICD-10-CM | POA: Diagnosis not present

## 2016-04-26 DIAGNOSIS — D509 Iron deficiency anemia, unspecified: Secondary | ICD-10-CM | POA: Diagnosis not present

## 2016-04-28 DIAGNOSIS — D509 Iron deficiency anemia, unspecified: Secondary | ICD-10-CM | POA: Diagnosis not present

## 2016-04-28 DIAGNOSIS — I4892 Unspecified atrial flutter: Secondary | ICD-10-CM | POA: Diagnosis not present

## 2016-04-28 DIAGNOSIS — D631 Anemia in chronic kidney disease: Secondary | ICD-10-CM | POA: Diagnosis not present

## 2016-04-28 DIAGNOSIS — N186 End stage renal disease: Secondary | ICD-10-CM | POA: Diagnosis not present

## 2016-04-28 DIAGNOSIS — N2581 Secondary hyperparathyroidism of renal origin: Secondary | ICD-10-CM | POA: Diagnosis not present

## 2016-04-28 DIAGNOSIS — E875 Hyperkalemia: Secondary | ICD-10-CM | POA: Diagnosis not present

## 2016-04-30 ENCOUNTER — Ambulatory Visit (INDEPENDENT_AMBULATORY_CARE_PROVIDER_SITE_OTHER): Payer: Medicare Other | Admitting: *Deleted

## 2016-04-30 DIAGNOSIS — Z7901 Long term (current) use of anticoagulants: Secondary | ICD-10-CM

## 2016-04-30 DIAGNOSIS — I214 Non-ST elevation (NSTEMI) myocardial infarction: Secondary | ICD-10-CM

## 2016-04-30 DIAGNOSIS — Z5181 Encounter for therapeutic drug level monitoring: Secondary | ICD-10-CM

## 2016-04-30 DIAGNOSIS — N2581 Secondary hyperparathyroidism of renal origin: Secondary | ICD-10-CM | POA: Diagnosis not present

## 2016-04-30 DIAGNOSIS — D509 Iron deficiency anemia, unspecified: Secondary | ICD-10-CM | POA: Diagnosis not present

## 2016-04-30 DIAGNOSIS — I4891 Unspecified atrial fibrillation: Secondary | ICD-10-CM

## 2016-04-30 DIAGNOSIS — N186 End stage renal disease: Secondary | ICD-10-CM | POA: Diagnosis not present

## 2016-04-30 DIAGNOSIS — I4892 Unspecified atrial flutter: Secondary | ICD-10-CM | POA: Diagnosis not present

## 2016-04-30 DIAGNOSIS — E875 Hyperkalemia: Secondary | ICD-10-CM | POA: Diagnosis not present

## 2016-04-30 DIAGNOSIS — D631 Anemia in chronic kidney disease: Secondary | ICD-10-CM | POA: Diagnosis not present

## 2016-04-30 LAB — POCT INR: INR: 2.4

## 2016-05-03 DIAGNOSIS — D509 Iron deficiency anemia, unspecified: Secondary | ICD-10-CM | POA: Diagnosis not present

## 2016-05-03 DIAGNOSIS — N186 End stage renal disease: Secondary | ICD-10-CM | POA: Diagnosis not present

## 2016-05-03 DIAGNOSIS — N2581 Secondary hyperparathyroidism of renal origin: Secondary | ICD-10-CM | POA: Diagnosis not present

## 2016-05-03 DIAGNOSIS — D631 Anemia in chronic kidney disease: Secondary | ICD-10-CM | POA: Diagnosis not present

## 2016-05-03 DIAGNOSIS — E875 Hyperkalemia: Secondary | ICD-10-CM | POA: Diagnosis not present

## 2016-05-05 DIAGNOSIS — D631 Anemia in chronic kidney disease: Secondary | ICD-10-CM | POA: Diagnosis not present

## 2016-05-05 DIAGNOSIS — D509 Iron deficiency anemia, unspecified: Secondary | ICD-10-CM | POA: Diagnosis not present

## 2016-05-05 DIAGNOSIS — E875 Hyperkalemia: Secondary | ICD-10-CM | POA: Diagnosis not present

## 2016-05-05 DIAGNOSIS — N186 End stage renal disease: Secondary | ICD-10-CM | POA: Diagnosis not present

## 2016-05-05 DIAGNOSIS — N2581 Secondary hyperparathyroidism of renal origin: Secondary | ICD-10-CM | POA: Diagnosis not present

## 2016-05-06 DIAGNOSIS — Z992 Dependence on renal dialysis: Secondary | ICD-10-CM | POA: Diagnosis not present

## 2016-05-06 DIAGNOSIS — N186 End stage renal disease: Secondary | ICD-10-CM | POA: Diagnosis not present

## 2016-05-06 DIAGNOSIS — T8612 Kidney transplant failure: Secondary | ICD-10-CM | POA: Diagnosis not present

## 2016-05-07 DIAGNOSIS — D509 Iron deficiency anemia, unspecified: Secondary | ICD-10-CM | POA: Diagnosis not present

## 2016-05-07 DIAGNOSIS — N186 End stage renal disease: Secondary | ICD-10-CM | POA: Diagnosis not present

## 2016-05-07 DIAGNOSIS — E875 Hyperkalemia: Secondary | ICD-10-CM | POA: Diagnosis not present

## 2016-05-07 DIAGNOSIS — D631 Anemia in chronic kidney disease: Secondary | ICD-10-CM | POA: Diagnosis not present

## 2016-05-24 ENCOUNTER — Encounter (HOSPITAL_COMMUNITY): Payer: Self-pay | Admitting: *Deleted

## 2016-05-28 ENCOUNTER — Ambulatory Visit (INDEPENDENT_AMBULATORY_CARE_PROVIDER_SITE_OTHER): Payer: Medicare Other | Admitting: *Deleted

## 2016-05-28 DIAGNOSIS — I4892 Unspecified atrial flutter: Secondary | ICD-10-CM

## 2016-05-28 DIAGNOSIS — Z7901 Long term (current) use of anticoagulants: Secondary | ICD-10-CM

## 2016-05-28 DIAGNOSIS — Z5181 Encounter for therapeutic drug level monitoring: Secondary | ICD-10-CM | POA: Diagnosis not present

## 2016-05-28 DIAGNOSIS — I214 Non-ST elevation (NSTEMI) myocardial infarction: Secondary | ICD-10-CM

## 2016-05-28 DIAGNOSIS — I4891 Unspecified atrial fibrillation: Secondary | ICD-10-CM | POA: Diagnosis not present

## 2016-05-28 LAB — POCT INR: INR: 2.5

## 2016-06-05 DIAGNOSIS — N186 End stage renal disease: Secondary | ICD-10-CM | POA: Diagnosis not present

## 2016-06-05 DIAGNOSIS — Z992 Dependence on renal dialysis: Secondary | ICD-10-CM | POA: Diagnosis not present

## 2016-06-05 DIAGNOSIS — T8612 Kidney transplant failure: Secondary | ICD-10-CM | POA: Diagnosis not present

## 2016-06-07 DIAGNOSIS — E875 Hyperkalemia: Secondary | ICD-10-CM | POA: Diagnosis not present

## 2016-06-07 DIAGNOSIS — D509 Iron deficiency anemia, unspecified: Secondary | ICD-10-CM | POA: Diagnosis not present

## 2016-06-07 DIAGNOSIS — D631 Anemia in chronic kidney disease: Secondary | ICD-10-CM | POA: Diagnosis not present

## 2016-06-07 DIAGNOSIS — N186 End stage renal disease: Secondary | ICD-10-CM | POA: Diagnosis not present

## 2016-06-07 DIAGNOSIS — Z23 Encounter for immunization: Secondary | ICD-10-CM | POA: Diagnosis not present

## 2016-06-09 DIAGNOSIS — N186 End stage renal disease: Secondary | ICD-10-CM | POA: Diagnosis not present

## 2016-06-09 DIAGNOSIS — D631 Anemia in chronic kidney disease: Secondary | ICD-10-CM | POA: Diagnosis not present

## 2016-06-09 DIAGNOSIS — D509 Iron deficiency anemia, unspecified: Secondary | ICD-10-CM | POA: Diagnosis not present

## 2016-06-09 DIAGNOSIS — Z23 Encounter for immunization: Secondary | ICD-10-CM | POA: Diagnosis not present

## 2016-06-09 DIAGNOSIS — E875 Hyperkalemia: Secondary | ICD-10-CM | POA: Diagnosis not present

## 2016-06-11 DIAGNOSIS — Z23 Encounter for immunization: Secondary | ICD-10-CM | POA: Diagnosis not present

## 2016-06-11 DIAGNOSIS — D509 Iron deficiency anemia, unspecified: Secondary | ICD-10-CM | POA: Diagnosis not present

## 2016-06-11 DIAGNOSIS — N186 End stage renal disease: Secondary | ICD-10-CM | POA: Diagnosis not present

## 2016-06-11 DIAGNOSIS — E875 Hyperkalemia: Secondary | ICD-10-CM | POA: Diagnosis not present

## 2016-06-11 DIAGNOSIS — D631 Anemia in chronic kidney disease: Secondary | ICD-10-CM | POA: Diagnosis not present

## 2016-06-14 DIAGNOSIS — D509 Iron deficiency anemia, unspecified: Secondary | ICD-10-CM | POA: Diagnosis not present

## 2016-06-14 DIAGNOSIS — N186 End stage renal disease: Secondary | ICD-10-CM | POA: Diagnosis not present

## 2016-06-14 DIAGNOSIS — D631 Anemia in chronic kidney disease: Secondary | ICD-10-CM | POA: Diagnosis not present

## 2016-06-14 DIAGNOSIS — E875 Hyperkalemia: Secondary | ICD-10-CM | POA: Diagnosis not present

## 2016-06-14 DIAGNOSIS — Z23 Encounter for immunization: Secondary | ICD-10-CM | POA: Diagnosis not present

## 2016-06-16 DIAGNOSIS — D631 Anemia in chronic kidney disease: Secondary | ICD-10-CM | POA: Diagnosis not present

## 2016-06-16 DIAGNOSIS — N186 End stage renal disease: Secondary | ICD-10-CM | POA: Diagnosis not present

## 2016-06-16 DIAGNOSIS — E875 Hyperkalemia: Secondary | ICD-10-CM | POA: Diagnosis not present

## 2016-06-16 DIAGNOSIS — Z23 Encounter for immunization: Secondary | ICD-10-CM | POA: Diagnosis not present

## 2016-06-16 DIAGNOSIS — D509 Iron deficiency anemia, unspecified: Secondary | ICD-10-CM | POA: Diagnosis not present

## 2016-06-16 DIAGNOSIS — K922 Gastrointestinal hemorrhage, unspecified: Secondary | ICD-10-CM | POA: Insufficient documentation

## 2016-06-18 DIAGNOSIS — D631 Anemia in chronic kidney disease: Secondary | ICD-10-CM | POA: Diagnosis not present

## 2016-06-18 DIAGNOSIS — E875 Hyperkalemia: Secondary | ICD-10-CM | POA: Diagnosis not present

## 2016-06-18 DIAGNOSIS — D509 Iron deficiency anemia, unspecified: Secondary | ICD-10-CM | POA: Diagnosis not present

## 2016-06-18 DIAGNOSIS — Z23 Encounter for immunization: Secondary | ICD-10-CM | POA: Diagnosis not present

## 2016-06-18 DIAGNOSIS — N186 End stage renal disease: Secondary | ICD-10-CM | POA: Diagnosis not present

## 2016-06-21 DIAGNOSIS — D631 Anemia in chronic kidney disease: Secondary | ICD-10-CM | POA: Diagnosis not present

## 2016-06-21 DIAGNOSIS — E875 Hyperkalemia: Secondary | ICD-10-CM | POA: Diagnosis not present

## 2016-06-21 DIAGNOSIS — D509 Iron deficiency anemia, unspecified: Secondary | ICD-10-CM | POA: Diagnosis not present

## 2016-06-21 DIAGNOSIS — N186 End stage renal disease: Secondary | ICD-10-CM | POA: Diagnosis not present

## 2016-06-21 DIAGNOSIS — Z23 Encounter for immunization: Secondary | ICD-10-CM | POA: Diagnosis not present

## 2016-06-23 DIAGNOSIS — Z23 Encounter for immunization: Secondary | ICD-10-CM | POA: Diagnosis not present

## 2016-06-23 DIAGNOSIS — D631 Anemia in chronic kidney disease: Secondary | ICD-10-CM | POA: Diagnosis not present

## 2016-06-23 DIAGNOSIS — N186 End stage renal disease: Secondary | ICD-10-CM | POA: Diagnosis not present

## 2016-06-23 DIAGNOSIS — E875 Hyperkalemia: Secondary | ICD-10-CM | POA: Diagnosis not present

## 2016-06-23 DIAGNOSIS — D509 Iron deficiency anemia, unspecified: Secondary | ICD-10-CM | POA: Diagnosis not present

## 2016-06-24 DIAGNOSIS — H2513 Age-related nuclear cataract, bilateral: Secondary | ICD-10-CM | POA: Diagnosis not present

## 2016-06-25 DIAGNOSIS — N186 End stage renal disease: Secondary | ICD-10-CM | POA: Diagnosis not present

## 2016-06-25 DIAGNOSIS — E875 Hyperkalemia: Secondary | ICD-10-CM | POA: Diagnosis not present

## 2016-06-25 DIAGNOSIS — D509 Iron deficiency anemia, unspecified: Secondary | ICD-10-CM | POA: Diagnosis not present

## 2016-06-25 DIAGNOSIS — Z23 Encounter for immunization: Secondary | ICD-10-CM | POA: Diagnosis not present

## 2016-06-25 DIAGNOSIS — D631 Anemia in chronic kidney disease: Secondary | ICD-10-CM | POA: Diagnosis not present

## 2016-06-28 DIAGNOSIS — D509 Iron deficiency anemia, unspecified: Secondary | ICD-10-CM | POA: Diagnosis not present

## 2016-06-28 DIAGNOSIS — N186 End stage renal disease: Secondary | ICD-10-CM | POA: Diagnosis not present

## 2016-06-28 DIAGNOSIS — E875 Hyperkalemia: Secondary | ICD-10-CM | POA: Diagnosis not present

## 2016-06-28 DIAGNOSIS — D631 Anemia in chronic kidney disease: Secondary | ICD-10-CM | POA: Diagnosis not present

## 2016-06-28 DIAGNOSIS — Z23 Encounter for immunization: Secondary | ICD-10-CM | POA: Diagnosis not present

## 2016-06-30 DIAGNOSIS — N186 End stage renal disease: Secondary | ICD-10-CM | POA: Diagnosis not present

## 2016-06-30 DIAGNOSIS — D509 Iron deficiency anemia, unspecified: Secondary | ICD-10-CM | POA: Diagnosis not present

## 2016-06-30 DIAGNOSIS — I4892 Unspecified atrial flutter: Secondary | ICD-10-CM | POA: Diagnosis not present

## 2016-06-30 DIAGNOSIS — E875 Hyperkalemia: Secondary | ICD-10-CM | POA: Diagnosis not present

## 2016-06-30 DIAGNOSIS — Z23 Encounter for immunization: Secondary | ICD-10-CM | POA: Diagnosis not present

## 2016-06-30 DIAGNOSIS — D631 Anemia in chronic kidney disease: Secondary | ICD-10-CM | POA: Diagnosis not present

## 2016-07-02 DIAGNOSIS — D509 Iron deficiency anemia, unspecified: Secondary | ICD-10-CM | POA: Diagnosis not present

## 2016-07-02 DIAGNOSIS — D631 Anemia in chronic kidney disease: Secondary | ICD-10-CM | POA: Diagnosis not present

## 2016-07-02 DIAGNOSIS — E875 Hyperkalemia: Secondary | ICD-10-CM | POA: Diagnosis not present

## 2016-07-02 DIAGNOSIS — N186 End stage renal disease: Secondary | ICD-10-CM | POA: Diagnosis not present

## 2016-07-02 DIAGNOSIS — Z23 Encounter for immunization: Secondary | ICD-10-CM | POA: Diagnosis not present

## 2016-07-05 DIAGNOSIS — N186 End stage renal disease: Secondary | ICD-10-CM | POA: Diagnosis not present

## 2016-07-05 DIAGNOSIS — D509 Iron deficiency anemia, unspecified: Secondary | ICD-10-CM | POA: Diagnosis not present

## 2016-07-05 DIAGNOSIS — D631 Anemia in chronic kidney disease: Secondary | ICD-10-CM | POA: Diagnosis not present

## 2016-07-05 DIAGNOSIS — Z23 Encounter for immunization: Secondary | ICD-10-CM | POA: Diagnosis not present

## 2016-07-05 DIAGNOSIS — E875 Hyperkalemia: Secondary | ICD-10-CM | POA: Diagnosis not present

## 2016-07-06 DIAGNOSIS — T8612 Kidney transplant failure: Secondary | ICD-10-CM | POA: Diagnosis not present

## 2016-07-06 DIAGNOSIS — N186 End stage renal disease: Secondary | ICD-10-CM | POA: Diagnosis not present

## 2016-07-06 DIAGNOSIS — Z992 Dependence on renal dialysis: Secondary | ICD-10-CM | POA: Diagnosis not present

## 2016-07-06 DIAGNOSIS — R195 Other fecal abnormalities: Secondary | ICD-10-CM | POA: Diagnosis not present

## 2016-07-07 DIAGNOSIS — E875 Hyperkalemia: Secondary | ICD-10-CM | POA: Diagnosis not present

## 2016-07-07 DIAGNOSIS — N186 End stage renal disease: Secondary | ICD-10-CM | POA: Diagnosis not present

## 2016-07-07 DIAGNOSIS — Z23 Encounter for immunization: Secondary | ICD-10-CM | POA: Diagnosis not present

## 2016-07-07 DIAGNOSIS — D509 Iron deficiency anemia, unspecified: Secondary | ICD-10-CM | POA: Diagnosis not present

## 2016-07-08 ENCOUNTER — Telehealth: Payer: Self-pay | Admitting: Cardiovascular Disease

## 2016-07-08 NOTE — Telephone Encounter (Signed)
New message   Pt wife verbalized that she wants to have an EKG done on her husband 07-09-16 when he comes in to have his coum checked

## 2016-07-08 NOTE — Telephone Encounter (Signed)
Spoke with pt's wife. She reports pt has been short of breath and tired lately. Was in atrial fib at dialysis yesterday.Heart rate earlier today around 120 and pt was short of breath when going out to yard. BP was 95/70.  Pt is feeling better now. Wife is asking if pt can have EKG done when here for coumadin appt tomorrow.  I told pt's wife with symptoms he has been having pt should be seen by a provider. Pt has dialysis tomorrow morning but can be here in the afternoon. Appt made for him to see Melina Copa, PA tomorrow at 1:30.  I told wife pt should go to ED if symptoms worsen prior to appt.

## 2016-07-09 ENCOUNTER — Ambulatory Visit: Payer: Medicare Other | Admitting: Physician Assistant

## 2016-07-09 ENCOUNTER — Emergency Department (HOSPITAL_COMMUNITY): Payer: Medicare Other

## 2016-07-09 ENCOUNTER — Inpatient Hospital Stay (HOSPITAL_COMMUNITY)
Admission: EM | Admit: 2016-07-09 | Discharge: 2016-07-13 | DRG: 291 | Disposition: A | Discharge status: Home / Self Care | Payer: Medicare Other | Attending: Oncology | Admitting: Oncology

## 2016-07-09 ENCOUNTER — Encounter (HOSPITAL_COMMUNITY): Payer: Self-pay | Admitting: Emergency Medicine

## 2016-07-09 DIAGNOSIS — Z833 Family history of diabetes mellitus: Secondary | ICD-10-CM

## 2016-07-09 DIAGNOSIS — Z953 Presence of xenogenic heart valve: Secondary | ICD-10-CM

## 2016-07-09 DIAGNOSIS — E875 Hyperkalemia: Secondary | ICD-10-CM | POA: Diagnosis not present

## 2016-07-09 DIAGNOSIS — I700 Atherosclerosis of aorta: Secondary | ICD-10-CM | POA: Diagnosis present

## 2016-07-09 DIAGNOSIS — Z9114 Patient's other noncompliance with medication regimen: Secondary | ICD-10-CM

## 2016-07-09 DIAGNOSIS — Z87891 Personal history of nicotine dependence: Secondary | ICD-10-CM

## 2016-07-09 DIAGNOSIS — Z7902 Long term (current) use of antithrombotics/antiplatelets: Secondary | ICD-10-CM

## 2016-07-09 DIAGNOSIS — N052 Unspecified nephritic syndrome with diffuse membranous glomerulonephritis: Secondary | ICD-10-CM | POA: Diagnosis present

## 2016-07-09 DIAGNOSIS — R0902 Hypoxemia: Secondary | ICD-10-CM | POA: Diagnosis present

## 2016-07-09 DIAGNOSIS — I5033 Acute on chronic diastolic (congestive) heart failure: Secondary | ICD-10-CM | POA: Diagnosis not present

## 2016-07-09 DIAGNOSIS — I2510 Atherosclerotic heart disease of native coronary artery without angina pectoris: Secondary | ICD-10-CM | POA: Diagnosis not present

## 2016-07-09 DIAGNOSIS — Z9109 Other allergy status, other than to drugs and biological substances: Secondary | ICD-10-CM

## 2016-07-09 DIAGNOSIS — E8779 Other fluid overload: Secondary | ICD-10-CM

## 2016-07-09 DIAGNOSIS — I472 Ventricular tachycardia: Secondary | ICD-10-CM | POA: Diagnosis present

## 2016-07-09 DIAGNOSIS — Z951 Presence of aortocoronary bypass graft: Secondary | ICD-10-CM

## 2016-07-09 DIAGNOSIS — R531 Weakness: Secondary | ICD-10-CM | POA: Diagnosis not present

## 2016-07-09 DIAGNOSIS — Z955 Presence of coronary angioplasty implant and graft: Secondary | ICD-10-CM

## 2016-07-09 DIAGNOSIS — I471 Supraventricular tachycardia: Secondary | ICD-10-CM | POA: Diagnosis present

## 2016-07-09 DIAGNOSIS — T8612 Kidney transplant failure: Secondary | ICD-10-CM | POA: Diagnosis present

## 2016-07-09 DIAGNOSIS — T8611 Kidney transplant rejection: Secondary | ICD-10-CM | POA: Diagnosis present

## 2016-07-09 DIAGNOSIS — Z888 Allergy status to other drugs, medicaments and biological substances status: Secondary | ICD-10-CM

## 2016-07-09 DIAGNOSIS — J449 Chronic obstructive pulmonary disease, unspecified: Secondary | ICD-10-CM | POA: Diagnosis present

## 2016-07-09 DIAGNOSIS — G35 Multiple sclerosis: Secondary | ICD-10-CM | POA: Diagnosis present

## 2016-07-09 DIAGNOSIS — R000 Tachycardia, unspecified: Secondary | ICD-10-CM

## 2016-07-09 DIAGNOSIS — Z992 Dependence on renal dialysis: Secondary | ICD-10-CM

## 2016-07-09 DIAGNOSIS — D631 Anemia in chronic kidney disease: Secondary | ICD-10-CM | POA: Diagnosis not present

## 2016-07-09 DIAGNOSIS — I5041 Acute combined systolic (congestive) and diastolic (congestive) heart failure: Secondary | ICD-10-CM | POA: Diagnosis not present

## 2016-07-09 DIAGNOSIS — E8889 Other specified metabolic disorders: Secondary | ICD-10-CM | POA: Diagnosis present

## 2016-07-09 DIAGNOSIS — I480 Paroxysmal atrial fibrillation: Secondary | ICD-10-CM | POA: Diagnosis not present

## 2016-07-09 DIAGNOSIS — I509 Heart failure, unspecified: Secondary | ICD-10-CM

## 2016-07-09 DIAGNOSIS — N2581 Secondary hyperparathyroidism of renal origin: Secondary | ICD-10-CM | POA: Diagnosis present

## 2016-07-09 DIAGNOSIS — I050 Rheumatic mitral stenosis: Secondary | ICD-10-CM | POA: Diagnosis present

## 2016-07-09 DIAGNOSIS — Z880 Allergy status to penicillin: Secondary | ICD-10-CM

## 2016-07-09 DIAGNOSIS — G4733 Obstructive sleep apnea (adult) (pediatric): Secondary | ICD-10-CM | POA: Diagnosis present

## 2016-07-09 DIAGNOSIS — N186 End stage renal disease: Secondary | ICD-10-CM | POA: Diagnosis not present

## 2016-07-09 DIAGNOSIS — I110 Hypertensive heart disease with heart failure: Secondary | ICD-10-CM | POA: Diagnosis not present

## 2016-07-09 DIAGNOSIS — I739 Peripheral vascular disease, unspecified: Secondary | ICD-10-CM | POA: Diagnosis present

## 2016-07-09 DIAGNOSIS — I252 Old myocardial infarction: Secondary | ICD-10-CM

## 2016-07-09 DIAGNOSIS — Z9981 Dependence on supplemental oxygen: Secondary | ICD-10-CM | POA: Diagnosis not present

## 2016-07-09 DIAGNOSIS — D509 Iron deficiency anemia, unspecified: Secondary | ICD-10-CM | POA: Diagnosis not present

## 2016-07-09 DIAGNOSIS — D62 Acute posthemorrhagic anemia: Secondary | ICD-10-CM | POA: Diagnosis not present

## 2016-07-09 DIAGNOSIS — Z23 Encounter for immunization: Secondary | ICD-10-CM | POA: Diagnosis not present

## 2016-07-09 DIAGNOSIS — I452 Bifascicular block: Secondary | ICD-10-CM | POA: Diagnosis present

## 2016-07-09 DIAGNOSIS — I4892 Unspecified atrial flutter: Secondary | ICD-10-CM | POA: Diagnosis present

## 2016-07-09 DIAGNOSIS — I959 Hypotension, unspecified: Secondary | ICD-10-CM | POA: Diagnosis present

## 2016-07-09 DIAGNOSIS — Y830 Surgical operation with transplant of whole organ as the cause of abnormal reaction of the patient, or of later complication, without mention of misadventure at the time of the procedure: Secondary | ICD-10-CM | POA: Diagnosis present

## 2016-07-09 DIAGNOSIS — I132 Hypertensive heart and chronic kidney disease with heart failure and with stage 5 chronic kidney disease, or end stage renal disease: Principal | ICD-10-CM | POA: Diagnosis present

## 2016-07-09 DIAGNOSIS — I25810 Atherosclerosis of coronary artery bypass graft(s) without angina pectoris: Secondary | ICD-10-CM | POA: Diagnosis not present

## 2016-07-09 DIAGNOSIS — I4891 Unspecified atrial fibrillation: Secondary | ICD-10-CM | POA: Diagnosis not present

## 2016-07-09 DIAGNOSIS — R404 Transient alteration of awareness: Secondary | ICD-10-CM | POA: Diagnosis not present

## 2016-07-09 DIAGNOSIS — Z79899 Other long term (current) drug therapy: Secondary | ICD-10-CM

## 2016-07-09 DIAGNOSIS — Z7901 Long term (current) use of anticoagulants: Secondary | ICD-10-CM

## 2016-07-09 DIAGNOSIS — Z8249 Family history of ischemic heart disease and other diseases of the circulatory system: Secondary | ICD-10-CM

## 2016-07-09 LAB — CBC WITH DIFFERENTIAL/PLATELET
BASOS ABS: 0 10*3/uL (ref 0.0–0.1)
BASOS PCT: 0 %
EOS ABS: 0.1 10*3/uL (ref 0.0–0.7)
Eosinophils Relative: 2 %
HEMATOCRIT: 37.7 % — AB (ref 39.0–52.0)
HEMOGLOBIN: 12.4 g/dL — AB (ref 13.0–17.0)
Lymphocytes Relative: 7 %
Lymphs Abs: 0.5 10*3/uL — ABNORMAL LOW (ref 0.7–4.0)
MCH: 33.5 pg (ref 26.0–34.0)
MCHC: 32.9 g/dL (ref 30.0–36.0)
MCV: 101.9 fL — ABNORMAL HIGH (ref 78.0–100.0)
Monocytes Absolute: 0.8 10*3/uL (ref 0.1–1.0)
Monocytes Relative: 11 %
NEUTROS ABS: 6 10*3/uL (ref 1.7–7.7)
NEUTROS PCT: 80 %
Platelets: 150 10*3/uL (ref 150–400)
RBC: 3.7 MIL/uL — AB (ref 4.22–5.81)
RDW: 16 % — ABNORMAL HIGH (ref 11.5–15.5)
WBC: 7.5 10*3/uL (ref 4.0–10.5)

## 2016-07-09 LAB — COMPREHENSIVE METABOLIC PANEL
ALBUMIN: 3.5 g/dL (ref 3.5–5.0)
ALK PHOS: 128 U/L — AB (ref 38–126)
ALT: 26 U/L (ref 17–63)
ANION GAP: 13 (ref 5–15)
AST: 29 U/L (ref 15–41)
BUN: 43 mg/dL — AB (ref 6–20)
CALCIUM: 7.3 mg/dL — AB (ref 8.9–10.3)
CO2: 26 mmol/L (ref 22–32)
Chloride: 98 mmol/L — ABNORMAL LOW (ref 101–111)
Creatinine, Ser: 7.22 mg/dL — ABNORMAL HIGH (ref 0.61–1.24)
GFR calc Af Amer: 8 mL/min — ABNORMAL LOW (ref >60)
GFR calc non Af Amer: 7 mL/min — ABNORMAL LOW (ref >60)
GLUCOSE: 81 mg/dL (ref 65–99)
Potassium: 4.8 mmol/L (ref 3.5–5.1)
SODIUM: 137 mmol/L (ref 135–145)
Total Bilirubin: 0.5 mg/dL (ref 0.3–1.2)
Total Protein: 7.5 g/dL (ref 6.5–8.1)

## 2016-07-09 LAB — BLOOD GAS, ARTERIAL
Acid-Base Excess: 2.2 mmol/L — ABNORMAL HIGH (ref 0.0–2.0)
Bicarbonate: 27.2 mmol/L (ref 20.0–28.0)
Delivery systems: POSITIVE
Drawn by: 275531
Expiratory PAP: 8
FIO2: 0.4
Inspiratory PAP: 16
O2 Saturation: 94.5 %
Patient temperature: 98.6
pCO2 arterial: 49.8 mmHg — ABNORMAL HIGH (ref 32.0–48.0)
pH, Arterial: 7.356 (ref 7.350–7.450)
pO2, Arterial: 104 mmHg (ref 83.0–108.0)

## 2016-07-09 LAB — I-STAT TROPONIN, ED: Troponin i, poc: 0.03 ng/mL (ref 0.00–0.08)

## 2016-07-09 LAB — CBG MONITORING, ED: Glucose-Capillary: 100 mg/dL — ABNORMAL HIGH (ref 65–99)

## 2016-07-09 LAB — PROTIME-INR
INR: 2.63
PROTHROMBIN TIME: 28.6 s — AB (ref 11.4–15.2)

## 2016-07-09 LAB — MRSA PCR SCREENING: MRSA by PCR: NEGATIVE

## 2016-07-09 LAB — BRAIN NATRIURETIC PEPTIDE: B NATRIURETIC PEPTIDE 5: 2414.3 pg/mL — AB (ref 0.0–100.0)

## 2016-07-09 MED ORDER — METOPROLOL TARTRATE 12.5 MG HALF TABLET
12.5000 mg | ORAL_TABLET | Freq: Two times a day (BID) | ORAL | Status: DC
  Administered 2016-07-09 – 2016-07-13 (×8): 12.5 mg via ORAL
  Filled 2016-07-09 (×8): qty 1

## 2016-07-09 MED ORDER — ALBUMIN HUMAN 25 % IV SOLN
25.0000 g | Freq: Once | INTRAVENOUS | Status: AC
  Administered 2016-07-09: 25 g via INTRAVENOUS

## 2016-07-09 MED ORDER — FOLIC ACID 0.5 MG HALF TAB
500.0000 ug | ORAL_TABLET | Freq: Every day | ORAL | Status: DC
  Administered 2016-07-10 – 2016-07-13 (×4): 0.5 mg via ORAL
  Filled 2016-07-09 (×6): qty 1

## 2016-07-09 MED ORDER — LIDOCAINE HCL (PF) 1 % IJ SOLN: 5.0000 mL | INTRAMUSCULAR | Status: DC | PRN

## 2016-07-09 MED ORDER — METOPROLOL TARTRATE 5 MG/5ML IV SOLN
10.0000 mg | Freq: Once | INTRAVENOUS | Status: AC
  Administered 2016-07-09: 10 mg via INTRAVENOUS
  Filled 2016-07-09: qty 10

## 2016-07-09 MED ORDER — AMIODARONE HCL IN DEXTROSE 360-4.14 MG/200ML-% IV SOLN
30.0000 mg/h | INTRAVENOUS | Status: DC
  Administered 2016-07-10 – 2016-07-12 (×6): 30 mg/h via INTRAVENOUS
  Filled 2016-07-09 (×4): qty 200

## 2016-07-09 MED ORDER — LEVALBUTEROL HCL 0.63 MG/3ML IN NEBU
INHALATION_SOLUTION | RESPIRATORY_TRACT | Status: AC
  Administered 2016-07-09: 0.63 mg
  Filled 2016-07-09: qty 3

## 2016-07-09 MED ORDER — HEPARIN SODIUM (PORCINE) 1000 UNIT/ML DIALYSIS: 1000.0000 [IU] | INTRAMUSCULAR | Status: DC | PRN

## 2016-07-09 MED ORDER — ACETAMINOPHEN 325 MG PO TABS: 650.0000 mg | ORAL_TABLET | Freq: Four times a day (QID) | ORAL | Status: DC | PRN

## 2016-07-09 MED ORDER — ACETAMINOPHEN 650 MG RE SUPP: 650.0000 mg | Freq: Four times a day (QID) | RECTAL | Status: DC | PRN

## 2016-07-09 MED ORDER — PENTAFLUOROPROP-TETRAFLUOROETH EX AERO: 1.0000 "application " | INHALATION_SPRAY | CUTANEOUS | Status: DC | PRN

## 2016-07-09 MED ORDER — WARFARIN SODIUM 7.5 MG PO TABS
7.5000 mg | ORAL_TABLET | Freq: Once | ORAL | Status: AC
  Administered 2016-07-09: 7.5 mg via ORAL
  Filled 2016-07-09 (×2): qty 1

## 2016-07-09 MED ORDER — SODIUM CHLORIDE 0.9 % IV SOLN: 100.0000 mL | INTRAVENOUS | Status: DC | PRN

## 2016-07-09 MED ORDER — ALBUTEROL SULFATE (2.5 MG/3ML) 0.083% IN NEBU: 2.5000 mg | INHALATION_SOLUTION | Freq: Four times a day (QID) | RESPIRATORY_TRACT | Status: DC | PRN

## 2016-07-09 MED ORDER — SODIUM CHLORIDE 0.9 % IV SOLN
125.0000 mg | INTRAVENOUS | Status: DC
  Filled 2016-07-09: qty 10

## 2016-07-09 MED ORDER — CALCIUM ACETATE (PHOS BINDER) 667 MG PO CAPS
4002.0000 mg | ORAL_CAPSULE | Freq: Three times a day (TID) | ORAL | Status: DC
  Administered 2016-07-09 – 2016-07-13 (×10): 4002 mg via ORAL
  Filled 2016-07-09 (×13): qty 6

## 2016-07-09 MED ORDER — GABAPENTIN 300 MG PO CAPS
300.0000 mg | ORAL_CAPSULE | Freq: Every day | ORAL | Status: DC
Start: 2016-07-09 — End: 2016-07-13
  Administered 2016-07-09 – 2016-07-12 (×4): 300 mg via ORAL
  Filled 2016-07-09 (×4): qty 1

## 2016-07-09 MED ORDER — ALBUMIN HUMAN 25 % IV SOLN
INTRAVENOUS | Status: AC
  Administered 2016-07-09: 25 g via INTRAVENOUS
  Filled 2016-07-09: qty 100

## 2016-07-09 MED ORDER — WARFARIN - PHARMACIST DOSING INPATIENT
Freq: Every day | Status: DC
  Administered 2016-07-10 – 2016-07-11 (×2)

## 2016-07-09 MED ORDER — CLOPIDOGREL BISULFATE 75 MG PO TABS
75.0000 mg | ORAL_TABLET | Freq: Every day | ORAL | Status: DC
  Administered 2016-07-10 – 2016-07-13 (×4): 75 mg via ORAL
  Filled 2016-07-09 (×4): qty 1

## 2016-07-09 MED ORDER — MIDODRINE HCL 5 MG PO TABS
ORAL_TABLET | ORAL | Status: AC
  Administered 2016-07-09: 10 mg via ORAL
  Filled 2016-07-09: qty 2

## 2016-07-09 MED ORDER — MIDODRINE HCL 5 MG PO TABS
10.0000 mg | ORAL_TABLET | Freq: Once | ORAL | Status: AC
  Administered 2016-07-09: 10 mg via ORAL

## 2016-07-09 MED ORDER — ATORVASTATIN CALCIUM 40 MG PO TABS
40.0000 mg | ORAL_TABLET | Freq: Every day | ORAL | Status: DC
  Administered 2016-07-09 – 2016-07-12 (×4): 40 mg via ORAL
  Filled 2016-07-09 (×4): qty 1

## 2016-07-09 MED ORDER — METOPROLOL TARTRATE 5 MG/5ML IV SOLN
2.5000 mg | INTRAVENOUS | Status: AC | PRN
  Administered 2016-07-09 (×2): 2.5 mg via INTRAVENOUS

## 2016-07-09 MED ORDER — METOPROLOL TARTRATE 5 MG/5ML IV SOLN
INTRAVENOUS | Status: AC
  Administered 2016-07-09: 2.5 mg via INTRAVENOUS
  Filled 2016-07-09: qty 5

## 2016-07-09 MED ORDER — DOCUSATE SODIUM 100 MG PO CAPS
300.0000 mg | ORAL_CAPSULE | Freq: Two times a day (BID) | ORAL | Status: DC
  Administered 2016-07-09 – 2016-07-11 (×4): 300 mg via ORAL
  Filled 2016-07-09 (×7): qty 3

## 2016-07-09 MED ORDER — CITALOPRAM HYDROBROMIDE 20 MG PO TABS
20.0000 mg | ORAL_TABLET | Freq: Every day | ORAL | Status: DC
  Administered 2016-07-09 – 2016-07-12 (×4): 20 mg via ORAL
  Filled 2016-07-09: qty 2
  Filled 2016-07-09 (×4): qty 1

## 2016-07-09 MED ORDER — AMIODARONE HCL IN DEXTROSE 360-4.14 MG/200ML-% IV SOLN
60.0000 mg/h | INTRAVENOUS | Status: AC
  Administered 2016-07-09 (×2): 60 mg/h via INTRAVENOUS
  Filled 2016-07-09 (×3): qty 200

## 2016-07-09 MED ORDER — LIDOCAINE-PRILOCAINE 2.5-2.5 % EX CREA: 1.0000 "application " | TOPICAL_CREAM | CUTANEOUS | Status: DC | PRN

## 2016-07-09 NOTE — Telephone Encounter (Signed)
Noted thanks °

## 2016-07-09 NOTE — ED Notes (Signed)
Pt CBG, 100. Nurse was notified.

## 2016-07-09 NOTE — Progress Notes (Signed)
Report called to Judeen Hammans, Agricultural consultant on [place].  Patient transferring to 2H-24.

## 2016-07-09 NOTE — Consult Note (Signed)
CARDIOLOGY CONSULT NOTE   Patient ID: [name] MRN: [id number] DOB/AGE: 10-Oct-1955 60 y.o.  Admit date: 07/09/2016  Requesting Physician: Dr. Beryle Beams Primary Physician:   Teressa Lower, MD Primary Cardiologist:   Dr. Malcolm Metro Reason for Consultation:  Atrial flutter with RVR  HPI: [name] is a 60 y.o. 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   He has an extensive hx of CAD s/p 2V CABG in 2014 (Y SVG to LAD & D2), NSTEMI 01/2015 s/p DES to LCx and BMS to SVG-LAD at [facility], STEMI 02/2016 s/p PTCA/DES to SVG-LAD extending in native vessel. 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   To recap history, recent nuc 11/2015 in prep for repeat transplant eval was low risk at [facility]. In 01/2016, he was cleared to stop Plavix but remained on ASA + Coumadin. Per notes, it was felt by the transplant team that he would need to be off Plavix to be considered for transplantation. He was admitted 02/16/16 with midsternal chest pain with radiation to his jaw/teeth. EKG showed anterior STEMI. LHC 02/15/16: acute occlusion of SVG-LAD at anastamosis, patent LCx with moderate restenosis, moderate RCA disease, patent Y graft to diagonal with moderate ostial stenosis, s/p angioplasty/DES to SVG-LAD extending into the native vessel - this was a difficult procedure c/b large right groin hematoma. 2D 02/15/16: EF 55-60%, HK basal mid ateroseptal myocardium and HK of apical anterior myocardium, calcified AV with mild AS, mild AI, calcified MV with mobility restriction of posterior leaflet with mild MR, mod dilated LAE,  PASP 37mHg. His post-hospital course notable for NSVT, prominent bifascicular block on EKG post-cath, ABL anemia, nocturnal hypoxia (known for patient), and episodic hypotension. Coumadin was held for several days and actually reversed due to supratherapeutic INR and progressive anemia. Hgb was 11.7 on admission and drifted down to a nadir of 7.9. He received 3 u PRBC total. CT abd pelvis ruled out retroperitoneal hemorrhage. Groin [place] without pseudoaneurysm. Atorvastatin was increased from 1/2 tablet daily (wife was giving actually every other day due to prior myalgias) to full tablet daily. Hgb on day of DC was 10.3 and Coumadin was restarted. The plan was outlined for triple therapy initially and stopping aspirin 1-2 weeks after PCI, which Dr. NMeda Coffeecalculated 2 weeks at 02/28/16. Long term decisions will need to be made in 6 months to decide if it is safe to stop Plavix at some point - the patient is concerned that this time delay would exclude him from a kidney transplant (currently on the list), but our concern is that he developed STEMI only a week after discontinuation of Plavix. F/u Hgb 6/19 was 11.1  He was found to be back in afib in 02/2016 at outpatient follow up. Week later at pharm D visit back in sinus. Admitted 03/2016 for SOB. Felt like he was volume overloaded. Fluid managed with HD.  Echo 03/25/16 with preserved LVEF.moderate MR, bioprosthetic AVR. Grade 2 diastolic dysfunction.  Seen for post hosp follow by Dr. MJulianne Handleron 03/31/16 and back in sinus.  He had been feeling SOB and increased fatigue x3 days.  Tele at HD on 07/08/16 showed back in afib. He came into the ER and found to have acute pulmonary edema. Remains in afib. Taken to HD today for extra volume removal.   Seen in HD. He currently feels a little better but still SOB. No chest pain. + orthopnea and PND. NO LE edema, dizziness or syncope. Has felt more SOB since his MI last year but worse over the past two weeks and even  worse over the past 3 days.    Past Medical History:  Diagnosis Date  . Acute blood loss anemia    a. 02/2016 due to groin hematoma. Rec 3 U PRBC.  Marland Kitchen Anemia   . Aortic heart valve prolapse 04/2013   a. s/p bioprosthetic AVR at time of CABG.  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 Bifascicular block   . CAD (coronary artery disease) 04/2013   a. 04/2013: s/p CABG x 2 (Y SVG -LAD & D2). b. 01/2015: NSTEMI s/p DES to LCx, BMS to SVG-LAD. c. NSTEMI 05/2015 during AF/AFL - non-flow limiting FFR. d. Low risk nuc 3/17. e. STEMI 6/17 after coming off Plavix, s/p DES to SVG-LAD into native vessel.  . Carotid artery disease ([place])    a. 1-39% stenosis bilaterally in 06/2015.   Marland Kitchen ESRD on hemodialysis 02/12/2012   a. 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 b. He had a renal Tx from 2008 to 2011, but subsequent rejection - Gets HD in [place], [place] on MWF schedule.    . Hematoma    a. Large right groin hematoma after cath 02/2016 with associated ABL anemia.  . Hypertension   . Multiple sclerosis ([place])   . Multiple sclerosis ([employer])   . Nocturnal hypoxemia    a. Pt has home o2 QHS.  . On home oxygen therapy    pt states he has not been wearing it  . PAF (paroxysmal atrial fibrillation) ([place])   . Paroxysmal atrial flutter ([place])    a. During 05/2015 admission - SVT, atrial flutter, and PAF.  Marland Kitchen Peripheral vascular disease ([place])    Cath in 01/2015 required 45 cm Destination Sheath  . Renal transplant failure and rejection   . 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 intolerant of bipap.  Marland Kitchen SVT (supraventricular tachycardia) ([place])   . Valvular heart disease    a. 2D echo 05/2015: EF 55-60%, no RWMA, mild AS/mild AI, mod MS, severely dilated LA.     Past Surgical History:  Procedure Laterality Date  . AV FISTULA PLACEMENT    . CARDIAC CATHETERIZATION N/A 05/26/2015   Procedure: Left Heart Cath and Coronary Angiography;  Surgeon: Leonie Man,  MD;  Location: [place] CV LAB;  Service: Cardiovascular;  Laterality: N/A;  . CARDIAC CATHETERIZATION N/A 05/26/2015   Procedure: Intravascular Pressure Wire/FFR Study;  Surgeon: Leonie Man, MD;  Location: [place] CV LAB;  Service: Cardiovascular;  Laterality: N/A;  . CARDIAC CATHETERIZATION N/A 02/15/2016   Procedure: Left Heart Cath and Coronary Angiography;  Surgeon: Wellington Hampshire, MD;  Location: [place] CV LAB;  Service: Cardiovascular;  Laterality: N/A;  . excised squamous cells at rectum  2006  . flash     flash pulmonary edema  . HERNIA REPAIR  07/2011  . KIDNEY TRANSPLANT  08/2007  . LEFT HEART CATHETERIZATION WITH CORONARY ANGIOGRAM N/A 01/09/2013   Procedure: LEFT  HEART CATHETERIZATION WITH CORONARY ANGIOGRAM;  Surgeon: Burnell Blanks, MD;  Location: [facility] CATH LAB;  Service: Cardiovascular;  Laterality: N/A;    Allergies  Allergen Reactions  . Diphenhydramine Itching    Only with IV doses. Tolerates oral.  . Penicillins Other (See Comments)    migraine  . Adhesive [Tape] Rash    Please use paper tape  . Amoxicillin Rash    migraine  . Other Rash    Please use paper tape  . Penicillin G Rash    headache    I have reviewed the patient's current medications . atorvastatin  40 mg Oral Daily  . calcium acetate  4,002 mg Oral TID WC  . citalopram  20 mg Oral QHS  . clopidogrel  75 mg Oral Daily  . docusate sodium  300 mg Oral BID  . [START ON 07/12/2016] ferric gluconate (FERRLECIT/NULECIT) IV  125 mg Intravenous Q M,W,F-HD  . folic acid  299 mcg Oral Daily  . gabapentin  300 mg Oral QHS  . metoprolol tartrate  12.5 mg Oral BID  . warfarin  7.5 mg Oral ONCE-1800  . Warfarin - Pharmacist Dosing Inpatient   Does not apply q1800     sodium chloride, sodium chloride, acetaminophen **OR** acetaminophen, albuterol, heparin, lidocaine (PF), lidocaine-prilocaine, pentafluoroprop-tetrafluoroeth  Prior to Admission medications   Medication Sig Start Date  End Date Taking? Authorizing Provider  acetaminophen (TYLENOL) 500 MG tablet Take 1,000 mg by mouth every 6 (six) hours as needed for pain or fever.   Yes Historical Provider, MD  atorvastatin (LIPITOR) 40 MG tablet Take 40 mg by mouth daily. Pt takes 20 mg daily   Yes Historical Provider, MD  calcium acetate (PHOSLO) 667 MG capsule Take 4,002 mg by mouth 3 (three) times daily with meals.    Yes Historical Provider, MD  citalopram (CELEXA) 20 MG tablet Take 20 mg by mouth at bedtime.    Yes Historical Provider, MD  clopidogrel (PLAVIX) 75 MG tablet Take 1 tablet (75 mg total) by mouth daily. 02/19/16  Yes Dayna N Dunn, PA-C  docusate sodium (COLACE) 100 MG capsule Take 300 mg by mouth 2 (two) times daily.   Yes Historical Provider, MD  folic acid (FOLVITE) 1 MG tablet Take 400 mcg by mouth daily.    Yes Historical Provider, MD  gabapentin (NEURONTIN) 300 MG capsule Take 300 mg by mouth at bedtime.    Yes Historical Provider, MD  metoprolol tartrate (LOPRESSOR) 25 MG tablet Take 1 tablet (25 mg total) by mouth 2 (two) times daily. 03/05/16  Yes Burnell Blanks, MD  multivitamin (RENA-VIT) TABS tablet Take 1 tablet by mouth daily. 01/11/13  Yes Samella Parr, NP  ranitidine (ZANTAC) 150 MG tablet Take 150 mg by mouth daily.   Yes Historical Provider, MD  sodium polystyrene (KAYEXALATE) 15 GM/60ML suspension Take 15 g by mouth See admin instructions. Only take 15 g on Sun / Tues / Thurs / Sat (non dialysis days)   Yes Historical Provider, MD  warfarin (COUMADIN) 7.5 MG tablet Take 3.75-7.5 mg by mouth as directed. Takes 1/2 tab on Tues, Sat 3.'75mg'$  Takes 1 tab all other days   Yes Historical Provider, MD  albuterol (PROAIR HFA) 108 (90 BASE) MCG/ACT inhaler Inhale 1-2 puffs into the lungs every 6 (six) hours as needed for shortness of breath.  07/19/11   Historical Provider, MD  calcitRIOL (ROCALTROL) 0.25 MCG capsule Take 1 capsule (0.25 mcg total) by mouth daily. 03/27/16   Alma  Vivi Ferns, MD    nitroGLYCERIN (NITROSTAT) 0.4 MG SL tablet Place 0.4 mg under the tongue every 5 (five) minutes as needed for chest pain (Take one tabe every 5-10 minutes for chest pain. Once you have taken three tabs you need to notify your MD or go to ER if chest pain not gone.).    Historical Provider, MD     Social History   Social History  . Marital status: Married    Spouse name: N/A  . Number of children: N/A  . Years of education: N/A   Occupational History  . Disabled    Social History Main Topics  . Smoking status: Former Smoker    Packs/day: 2.00    Years: 20.00    Types: Cigarettes    Quit date: 08/06/1998  . Smokeless tobacco: Never Used  . Alcohol use No  . Drug use: No  . Sexual activity: Not on file   Other Topics Concern  . Not on file   Social History Narrative   No history of premature CAD in either parents or siblings. However, his maternal grandfather and 2 maternal uncles had coronary artery disease.    Family Status  Relation Status  . Mother Deceased  . Father Deceased  . Sister   . Maternal Grandfather   . Maternal Uncle   . Maternal Aunt   . Maternal Aunt   . Neg Hx    Family History  Problem Relation Age of Onset  . Heart failure Mother     started in 23s  . Diabetes Father   . Lupus Sister   . Heart attack Maternal Grandfather   . Heart attack Maternal Uncle   . Heart attack Maternal Aunt   . Hypertension Maternal Aunt   . Stroke Neg Hx        ROS:  Full 14 point review of systems complete and found to be negative unless listed above.  Physical Exam: Blood pressure 94/65, pulse (!) 132, temperature 97.6 F (36.4 C), resp. rate (!) 22, weight 179 lb 3.7 oz (81.3 kg), SpO2 96 %.  General: Well developed, well nourished, male in no acute distress. Chronically ill appearing Head: Eyes PERRLA, No xanthomas.   Normocephalic and atraumatic, oropharynx without edema or exudate. Dentition:  Lungs: crackles at bases Heart: HRRR S1 S2, no rub/gallop,  Heart irregular rate and rhythm with S1, S2  murmur. pulses are 2+ extrem.   Neck: No carotid bruits. No lymphadenopathy.  No JVD. Abdomen: Bowel sounds present, abdomen soft and non-tender without masses or hernias noted. Msk:  No spine or cva tenderness. No weakness, no joint deformities or effusions. Extremities: No clubbing or cyanosis. No LE  edema.  Neuro: Alert and oriented X 3. No focal deficits noted. Psych:  Good affect, responds appropriately Skin: No rashes or lesions noted.  Labs:   Lab Results  Component Value Date   WBC 7.5 07/09/2016   HGB 12.4 (L) 07/09/2016   HCT 37.7 (L) 07/09/2016   MCV 101.9 (H) 07/09/2016   PLT 150 07/09/2016    Recent Labs  07/09/16 0759  INR 2.63    Recent Labs Lab 07/09/16 0759  NA 137  K 4.8  CL 98*  CO2 26  BUN 43*  CREATININE 7.22*  CALCIUM 7.3*  PROT 7.5  BILITOT 0.5  ALKPHOS 128*  ALT 26  AST 29  GLUCOSE 81  ALBUMIN 3.5   Magnesium  Date Value Ref Range Status  01/09/2013 2.6 (H) 1.5 -  2.5 mg/dL Final   No results for input(s): CKTOTAL, CKMB, TROPONINI in the last 72 hours.  Recent Labs  07/09/16 0805  TROPIPOC 0.03   Pro B Natriuretic peptide (BNP)  Date/Time Value Ref Range Status  01/09/2013 01:00 AM 10,160.0 (H) 0 - 125 pg/mL Final   Lab Results  Component Value Date   CHOL 98 (L) 03/05/2016   HDL 32 (L) 03/05/2016   LDLCALC 37 03/05/2016   TRIG 143 03/05/2016    Echo: Echo 03/25/16: Left ventricle: The cavity size was normal. Wall thickness was increased in a pattern of mild LVH. Systolic function was normal. The estimated ejection fraction was in the range of 50% to 55%. Wall motion was normal; there were no regional wall motion abnormalities. Features are consistent with a pseudonormal left ventricular filling pattern, with concomitant abnormal relaxation and increased filling pressure (grade 2 diastolic dysfunction). Doppler parameters are consistent with high ventricular  filling pressure. - Aortic valve: A bioprosthesis was present. There was mild regurgitation. - Mitral valve: Severely calcified annulus. There was moderate regurgitation. - Left atrium: The atrium was severely dilated. - Right ventricle: The cavity size was mildly dilated. - Right atrium: The atrium was moderately dilated. - Pulmonary arteries: Systolic pressure was severely increased. PA peak pressure: 72 mm Hg (S).  Impressions:  - Low normal LV systolic function; grade 2 diastolic dysfunction with elevated LV filling pressure; biatrial enlargement; s/p AVR with normal gradients and mild AI; severe MAC with moderate, eccentric MR (may be underestimated; suggest TEE to further assess if clinically indicated); mild RVE; mild TR with severely elevated pulmonary pressure.   ECG:  Atrial flutter with var block. Inferior q waves.   Radiology:  Dg Chest Port 1 View  Result Date: 07/09/2016 CLINICAL DATA:  Dialysis dependent renal failure, transplant failure, recent episode of tachycardia, increased fatigue, history of coronary artery disease with stent placement EXAM: PORTABLE CHEST 1 VIEW COMPARISON:  PA and lateral chest x-ray of March 24, 2016 FINDINGS: The lungs are well-expanded. The interstitial markings are increased bilaterally and slightly more conspicuous than in the past. There is no alveolar infiltrate. A trace of pleural fluid blunts the right lateral costophrenic angle. There is no pneumothorax. The cardiac silhouette is enlarged. There are sternal wires present. The uppermost sternal wire is chronically broken. There is calcification in the wall of the aortic arch. IMPRESSION: Findings consistent with low-grade CHF superimposed upon COPD. No alveolar pneumonia. Aortic atherosclerosis. Electronically Signed   By: David  Martinique M.D.   On: 07/09/2016 08:32    ASSESSMENT AND PLAN:    Principal Problem:   CHF (congestive heart failure) (HCC) Active Problems:    Hypotension   Renal transplant failure and rejection   PAF (paroxysmal atrial fibrillation) (HCC)   ESRD on hemodialysis   SVT (supraventricular tachycardia) (HCC)   Hx of CABG x 2 2014   CAD (coronary artery disease)   Acute CHF ([place])   Paroxysmal atrial fibrillation: goes in an out. No role of DCCV. He may need AAD therapy at this point as this seem to contribute to his CHF and SOB. Continue coumadin for anti-coagulation. Continue Lopressor for now. BP is soft, we could consider loading him with IV amiodarone with no bolus due to hypotension  CAD: troponin neg x1. ECG with atrial flutter with inferior Q waves. He is s/p CABG and  PCI May 2016 at [facility] and most recently anterior STEMI after stopping Plavix due to occlusion of SVG to LAD. Plavix stopped  so he could be placed on the renal transplant list. Continue Plavix, beta blocker and statin. No ASA due to use of coumadin along with Plavix.   Acute on chronic diastolic CHF: volume management per nephrology. We may need to consider AAD for afib/flutter as above.  Aortic valve disease: s/p bioprosthetic AVR. Mild AI by echo July 2017.   Mitral stenosis: moderate by echo July 2017. Follow  HTN: BP has been soft. He has been continued on Lopresor 12.'5mg'$  BID.   ESRD: on HD. Getting HD now.  Carotid artery disease: Mild bilateral plaque by dopplers October 2016.    Signed: Angelena Form, PA-C 07/09/2016 4:05 PM  Pager [phone]  Co-Sign MD  Patient seen and examined  He is a 60 yo with CAD, ESRD Presents feeling SOB and with tachycardia  EKG and tele consistent with atrial flutter   On exam post dialysis he is comfortable  Lungs CTA  Cardiac exam RRR   No murmurs  Ext without edema   He has had tachycardia before  Will review with EP For now I would recomm IV amiodarone for rate control  He is anticoagulated The patient says since since surgery he has had dyspnea with activity (golfing)  Not sure if related  Clearly worse  onw  Would treat rhythm first before further evaluate  Dorris Carnes

## 2016-07-09 NOTE — Progress Notes (Signed)
RRT called for severe dyspnea.  Patient given 10 mg of midodrine and 25 g of albumin, approximately 5 minutes after med admin, patient abruptly sat up and stated he could not breath.  RRT called and patient placed on non re breather.  HR noted to be sustaining in 130's.  Breathing treatment administered and patient placed on bipap.  Nephrology at bedside.  Internal Med notified, coming to assess at bedside.  HD treatment running for duration of RRT.  Total of 5 mg lopressor given with no response in HR.  Will continue to monitor.

## 2016-07-09 NOTE — ED Notes (Signed)
IV team at bedside to de-access fistula.

## 2016-07-09 NOTE — Progress Notes (Signed)
Left  Forearm graft needle deaccessed, pressure applied for 20 min. No bleeding noted. RN made aware.

## 2016-07-09 NOTE — ED Notes (Signed)
Pt no longer has placement on 3E. Matt RN in HD notified of change in assignment.

## 2016-07-09 NOTE — Progress Notes (Signed)
1545:  Attempted to call report to [place].  Room dirty and RN off floor. 1600:  Attempted to call report to [place].  Room clean, RN off floor.  Will continue to monitor.

## 2016-07-09 NOTE — Progress Notes (Signed)
ANTICOAGULATION CONSULT NOTE - Initial Consult  Pharmacy Consult for Coumadin Indication: atrial fibrillation  Allergies  Allergen Reactions  . Diphenhydramine Itching    Only with IV doses. Tolerates oral.  . Penicillins Other (See Comments)    migraine  . Adhesive [Tape] Rash    Please use paper tape  . Amoxicillin Rash    migraine  . Other Rash    Please use paper tape  . Penicillin G Rash    headache     Vital Signs: Temp: 98.2 F (36.8 C) (11/03 0744) Temp Source: Oral (11/03 0744) BP: 103/80 (11/03 0744) Pulse Rate: 124 (11/03 0744)  Labs:  Recent Labs  07/09/16 0759  HGB 12.4*  HCT 37.7*  PLT 150  LABPROT 28.6*  INR 2.63  CREATININE 7.22*    CrCl cannot be calculated (Unknown ideal weight.).   Medical History: Past Medical History:  Diagnosis Date  . Acute blood loss anemia    a. 02/2016 due to groin hematoma. Rec 3 U PRBC.  Marland Kitchen Anemia   . Aortic heart valve prolapse 04/2013   a. s/p bioprosthetic AVR at time of CABG.  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 Bifascicular block   . CAD (coronary artery disease) 04/2013   a. 04/2013: s/p CABG x 2 (Y SVG -LAD & D2). b. 01/2015: NSTEMI s/p DES to LCx, BMS to SVG-LAD. c. NSTEMI 05/2015 during AF/AFL - non-flow limiting FFR. d. Low risk nuc 3/17. e. STEMI 6/17 after coming off Plavix, s/p DES to SVG-LAD into native vessel.  . Carotid artery disease ([place])    a. 1-39% stenosis bilaterally in 06/2015.   Marland Kitchen ESRD on hemodialysis 02/12/2012   a. 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 b. He had a renal Tx from 2008 to 2011, but subsequent rejection - Gets HD in [place], [place] on MWF schedule.    . Hematoma    a. Large right groin hematoma after cath 02/2016 with associated ABL anemia.  . Hypertension   . Multiple sclerosis ([place])   . Multiple sclerosis ([place])   . Nocturnal hypoxemia    a. Pt has home o2 QHS.  . On home oxygen therapy    pt states he has not been wearing it  . PAF (paroxysmal  atrial fibrillation) ([place])   . Paroxysmal atrial flutter ([place])    a. During 05/2015 admission - SVT, atrial flutter, and PAF.  Marland Kitchen Peripheral vascular disease ([place])    Cath in 01/2015 required 45 cm Destination Sheath  . Renal transplant failure and rejection   . 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 intolerant of bipap.  Marland Kitchen SVT (supraventricular tachycardia) ([place])   . Valvular heart disease    a. 2D echo 05/2015: EF 55-60%, no RWMA, mild AS/mild AI, mod MS, severely dilated LA.    Assessment: 60yom on coumadin pta for afib, being admitted with CHF exacerbation. Admit INR therapeutic at 2.63. CBC wnl.  Home dose: 7.'5mg'$  daily except 3.'75mg'$  on Tues/Sat - last taken 11/2  Goal of Therapy:  INR 2-3 Monitor platelets by anticoagulation protocol: Yes   Plan:  1) Coumadin 7.'5mg'$  x 1 per home regimen 2) Daily INR  Deboraha Sprang 07/09/2016,10:20 AM

## 2016-07-09 NOTE — H&P (Signed)
Date: 07/09/2016               Patient Name:  [name] MRN: [id number]  DOB: Sep 16, 1955 Age / Sex: 60 y.o., male   PCP: Algis Greenhouse, MD         Medical Service: Internal Medicine Teaching Service         Attending Physician: Dr. Annia Belt, MD    First Contact: Dr. Reesa Chew Pager: [phone]  Second Contact: Dr. Benjamine Mola Pager: [phone]       After Hours (After 5p/  First Contact Pager: [phone]  weekends / holidays): Second Contact Pager: [phone]   Chief Complaint: tachycardia and hypotension  History of Present Illness: 60 year old male with past medical history of end-stage renal disease, history of failed renal transplant, paroxysmal A. fib, diastolic CHF, History of aortic valve endocarditis, and coronary artery disease who presents from hemodialysis with tachycardia and hypotension. Patient was at dialysis on Wednesday which was stopped early due to hypotension and tachycardia. He states he left dialysis on Wednesday 2 kg heavier than his dry weight. He then presented to dialysis today which was stopped early secondary to tachycardia and hypotension again. He was then sent to the ED for further evaluation he states prior to HD he was 5 kg over his dry weight. He's had dyspnea on exertion since his last coronary stent in 2016 that was done in [facility]. Over the past 3 days he's noticed dyspnea on exertion when walking to the bathroom. He sleeps in a recliner which is unchanged since 2014. He denies weight gain which he monitors daily but has noticed that his hands have become tighter. He denies any chest pain. He had a cardiac cath in June 2017 with a drug-eluting stent placed that SVG-LAD. Denies nausea, vomiting, fever, chills.   In the emergency department his BNP was 2414.3 and chest x-ray revealing low-grade CHF. He was given 1 dose of Lopressor 10 mg IV. I-STAT troponin was within normal limits  Meds:  Current Meds  Medication Sig  . acetaminophen (TYLENOL) 500 MG tablet  Take 1,000 mg by mouth every 6 (six) hours as needed for pain or fever.  Marland Kitchen atorvastatin (LIPITOR) 40 MG tablet Take 40 mg by mouth daily. Pt takes 20 mg daily  . calcium acetate (PHOSLO) 667 MG capsule Take 4,002 mg by mouth 3 (three) times daily with meals.   . citalopram (CELEXA) 20 MG tablet Take 20 mg by mouth at bedtime.   . clopidogrel (PLAVIX) 75 MG tablet Take 1 tablet (75 mg total) by mouth daily.  Marland Kitchen docusate sodium (COLACE) 100 MG capsule Take 300 mg by mouth 2 (two) times daily.  . folic acid (FOLVITE) 1 MG tablet Take 400 mcg by mouth daily.   Marland Kitchen gabapentin (NEURONTIN) 300 MG capsule Take 300 mg by mouth at bedtime.   . metoprolol tartrate (LOPRESSOR) 25 MG tablet Take 1 tablet (25 mg total) by mouth 2 (two) times daily.  . multivitamin (RENA-VIT) TABS tablet Take 1 tablet by mouth daily.  . ranitidine (ZANTAC) 150 MG tablet Take 150 mg by mouth daily.  . sodium polystyrene (KAYEXALATE) 15 GM/60ML suspension Take 15 g by mouth See admin instructions. Only take 15 g on Sun / Tues / Thurs / Sat (non dialysis days)  . warfarin (COUMADIN) 7.5 MG tablet Take 3.75-7.5 mg by mouth as directed. Takes 1/2 tab on Tues, Sat 3.'75mg'$  Takes 1 tab all other days  . [DISCONTINUED] warfarin (COUMADIN) 7.5 MG  tablet Take 1/2-1 tablet by mouth daily as directed by coumadin clinic     Allergies: Allergies as of 07/09/2016 - Review Complete 07/09/2016  Allergen Reaction Noted  . Diphenhydramine Itching 12/04/2012  . Penicillins Other (See Comments) 02/12/2012  . Adhesive [tape] Rash 02/12/2012  . Amoxicillin Rash 01/29/2016  . Other Rash 01/29/2016  . Penicillin g Rash 01/29/2016   Past Medical History:  Diagnosis Date  . Acute blood loss anemia    a. 02/2016 due to groin hematoma. Rec 3 U PRBC.  Marland Kitchen Anemia   . Aortic heart valve prolapse 04/2013   a. s/p bioprosthetic AVR at time of CABG.  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 Bifascicular block   . CAD (coronary artery  disease) 04/2013   a. 04/2013: s/p CABG x 2 (Y SVG -LAD & D2). b. 01/2015: NSTEMI s/p DES to LCx, BMS to SVG-LAD. c. NSTEMI 05/2015 during AF/AFL - non-flow limiting FFR. d. Low risk nuc 3/17. e. STEMI 6/17 after coming off Plavix, s/p DES to SVG-LAD into native vessel.  . Carotid artery disease ([place])    a. 1-39% stenosis bilaterally in 06/2015.   Marland Kitchen ESRD on hemodialysis 02/12/2012   a. 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 b. He had a renal Tx from 2008 to 2011, but subsequent rejection - Gets HD in [place], [place] on MWF schedule.    . Hematoma    a. Large right groin hematoma after cath 02/2016 with associated ABL anemia.  . Hypertension   . Multiple sclerosis ([place])   . Multiple sclerosis ([place])   . Nocturnal hypoxemia    a. Pt has home o2 QHS.  . On home oxygen therapy    pt states he has not been wearing it  . PAF (paroxysmal atrial fibrillation) ([place])   . Paroxysmal atrial flutter ([place])    a. During 05/2015 admission - SVT, atrial flutter, and PAF.  Marland Kitchen Peripheral vascular disease ([place])    Cath in 01/2015 required 45 cm Destination Sheath  . Renal transplant failure and rejection   . 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 intolerant of bipap.  Marland Kitchen SVT (supraventricular tachycardia) ([place])   . Valvular heart disease    a. 2D echo 05/2015: EF 55-60%, no RWMA, mild AS/mild AI, mod MS, severely dilated LA.    Family History  Problem Relation Age of Onset  . Heart failure Mother     started in 30s  . Diabetes Father   . Lupus Sister   . Heart attack Maternal Grandfather   . Heart attack Maternal Uncle   . Heart attack Maternal Aunt   . Hypertension Maternal Aunt   . Stroke Neg Hx     Social History   Social History  . Marital status: Married    Spouse name: N/A  . Number of children: N/A  . Years of education: N/A   Occupational History  . Disabled    Social History Main Topics  . Smoking status: Former Smoker    Packs/day: 2.00     Years: 20.00    Types: Cigarettes    Quit date: 08/06/1998  . Smokeless tobacco: Never Used  . Alcohol use No  . Drug use: No  . Sexual activity: Not on file   Other Topics Concern  . Not on file   Social History Narrative   No history  of premature CAD in either parents or siblings. However, his maternal grandfather and 2 maternal uncles had coronary artery disease.    Review of Systems: A complete ROS was negative except as per HPI.   Physical Exam: Blood pressure 103/80, pulse (!) 124, temperature 98.2 F (36.8 C), temperature source Oral, resp. rate 22, SpO2 97 %. Physical Exam  Constitutional: He appears well-developed and well-nourished. No distress.  HENT:  Head: Normocephalic and atraumatic.  Nose: Nose normal.  Cardiovascular: Normal rate, regular rhythm and normal heart sounds.  Exam reveals no gallop and no friction rub.   No murmur heard. Pulmonary/Chest: Effort normal. No respiratory distress. He has wheezes. He has no rales.  Abdominal: Soft. Bowel sounds are normal. He exhibits no distension and no mass. There is no tenderness. There is no rebound and no guarding.  Skin: Skin is warm and dry. He is not diaphoretic. No erythema.  Left arm fistula with good flow and w/o signs of infection    EKG Interpretation  Date/Time:  Friday July 09 2016 08:32:08 EDT Ventricular Rate:  123 PR Interval:    QRS Duration: 110 QT Interval:  370 QTC Calculation: 530 R Axis:   93 Text Interpretation:  Sinus tachycardia Anterior infarct, old Nonspecific T abnormalities, inferior leads Prolonged QT interval No significant change since last tracing Confirmed by YAO  MD, DAVID ([id number]) on 07/09/2016 8:34:01 AM  CXR: Low-grade CHF  Assessment & Plan by Problem: Principal Problem:   CHF (congestive heart failure) ([place]) Active Problems:   Hypotension   Renal transplant failure and rejection   PAF (paroxysmal atrial fibrillation) ([place])   ESRD on hemodialysis   SVT  (supraventricular tachycardia) ([facility])   Hx of CABG x 2 2014   CAD (coronary artery disease)  Acute congestive heart failure exacerbation: Echo in July 2017 with EF of 50-55% and grade 2 diastolic dysfunction. BNP 2414.3. Likely secondary to incomplete hemodialysis sessions. He did present in July with acute shortness of breath and BNP of 1785 and elevated troponin. Cardiology was consulted and felt that elevated troponins were from demand ischemia. -Admit to telemetry -Continue beta blocker at halfdose- metoprolol 12.5 mg twice a day -Nephrology taking patient to hemodialysis today  --daily weights   End-stage renal disease on hemodialysis: 2/2 to membranoproliferative glomerular nephritis. nephrology consulted in the ED -- blood cultures x 2 obtained in ED   Coronary artery disease: Continue Lipitor 40 mg daily and Plavix  Paroxysmal atrial fibrillation: CHA2DS2/VASc score 3 (Congestive Heart Failure, Vascular Disease and Hypertension) INR 2.63. -Coumadin per pharmacy  Code: full        Dispo: Admit patient to Inpatient with expected length of stay greater than 2 midnights.  Signed: Norman Herrlich, MD 07/09/2016, 9:57 AM  Pager: [phone]

## 2016-07-09 NOTE — Consult Note (Signed)
[facility] Renal Consultation Note    Indication for Consultation:  Management of ESRD/hemodialysis; anemia, hypertension/volume and secondary hyperparathyroidism PCP:  HPI: [name] is a 60 y.o. male with ESRD on hemodialysis MWF at [facility]. PMH significant for failed renal tranplant, grade 2 diastolic dysfunction, H/O CABG/AVR with bioprosthetic valve, H/O PAF/Aflutter, CAD with multiple stents, STEMI 06/17 after stopping plavix DEX to SVG to LAD, valvular heart disease,carotid artery disease, hypertension, multiple sclerosis, OSA, S/P parathyroidectomy.   Patient presented to ED today after 40 minutes of hemodialysis during which he developed severe SOB. Patient states he has had worsening SOB, fatigue and DOE this week. Symptoms had been mitigated by HD. He left  however today he says he "just couldn't breath". CXR shows low-grade CHF superimposed upon COPD. Na 137 K+ 4.8 Cl 98 CO2 26 Ca 7.3 BS 81 Albumin 3.5 HGB 12.4 WBC 7.5 Plt 150 PT/INR 28.6/2.63. EKG on arrival to ED showed ST 120s. He received Metoprolol in ED without change in HR. He was brought to unit for urgent hemodialysis. Upon arrival to unit, EKG showed HR 130s. Stat EKG revealed Aflutter. SBP was in high 70's. He received midodrine 10 mg PO and albumin 25 G IV for hypotension. His SOB progressed and rapid response was called. He was placed on BIPAP. Patient had few bibasilar crackles, very tight breath sounds with very little air exchange in lower lobes bilaterally. He rec'd Xopenix with improvement of symptoms. He rec'd an additional 5 mg Metoprolol IV without change in HR. Currently he is back on O2 via nasal cannula @ 4L/M and feels better. He remains awake, alert, oriented and is now resting comfortably.   Patient has history of high IDWG, medical noncompliance with binders.   Past Medical History:  Diagnosis Date  . Acute blood loss anemia    a. 02/2016 due to groin hematoma. Rec 3 U PRBC.  Marland Kitchen  Anemia   . Aortic heart valve prolapse 04/2013   a. s/p bioprosthetic AVR at time of CABG.  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 Bifascicular block   . CAD (coronary artery disease) 04/2013   a. 04/2013: s/p CABG x 2 (Y SVG -LAD & D2). b. 01/2015: NSTEMI s/p DES to LCx, BMS to SVG-LAD. c. NSTEMI 05/2015 during AF/AFL - non-flow limiting FFR. d. Low risk nuc 3/17. e. STEMI 6/17 after coming off Plavix, s/p DES to SVG-LAD into native vessel.  . Carotid artery disease ([place])    a. 1-39% stenosis bilaterally in 06/2015.   Marland Kitchen ESRD on hemodialysis 02/12/2012   a. 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 b. He had a renal Tx from 2008 to 2011, but subsequent rejection - Gets HD in [place], [place] on MWF schedule.    . Hematoma    a. Large right groin hematoma after cath 02/2016 with associated ABL anemia.  . Hypertension   . Multiple sclerosis ([place])   . Multiple sclerosis ([place])   . Nocturnal hypoxemia    a. Pt has home o2 QHS.  . On home oxygen therapy    pt states he has not been wearing it  . PAF (paroxysmal atrial fibrillation) ([place])   . Paroxysmal atrial flutter ([place])    a. During 05/2015 admission - SVT, atrial flutter, and PAF.  Marland Kitchen Peripheral vascular disease ([place])    Cath in 01/2015 required 45 cm Destination Sheath  . Renal transplant failure and rejection   . S/P CABG x 2 04/2013  s/p CABG x 2 (Y SVG -LAD & D2);   Marland Kitchen Sleep apnea    a. intolerant of bipap.  Marland Kitchen SVT (supraventricular tachycardia) ([place])   . Valvular heart disease    a. 2D echo 05/2015: EF 55-60%, no RWMA, mild AS/mild AI, mod MS, severely dilated LA.   Past Surgical History:  Procedure Laterality Date  . AV FISTULA PLACEMENT    . CARDIAC CATHETERIZATION N/A 05/26/2015   Procedure: Left Heart Cath and Coronary Angiography;  Surgeon: Leonie Man, MD;  Location: [place] [facility];  Service: Cardiovascular;  Laterality: N/A;  . CARDIAC CATHETERIZATION N/A 05/26/2015   Procedure: Intravascular Pressure  Wire/FFR Study;  Surgeon: Leonie Man, MD;  Location: [place] [facility];  Service: Cardiovascular;  Laterality: N/A;  . CARDIAC CATHETERIZATION N/A 02/15/2016   Procedure: Left Heart Cath and Coronary Angiography;  Surgeon: Wellington Hampshire, MD;  Location: [place] [facility];  Service: Cardiovascular;  Laterality: N/A;  . excised squamous cells at rectum  2006  . flash     flash pulmonary edema  . HERNIA REPAIR  07/2011  . KIDNEY TRANSPLANT  08/2007  . LEFT HEART CATHETERIZATION WITH CORONARY ANGIOGRAM N/A 01/09/2013   Procedure: LEFT HEART CATHETERIZATION WITH CORONARY ANGIOGRAM;  Surgeon: Burnell Blanks, MD;  Location: [facility] CATH LAB;  Service: Cardiovascular;  Laterality: N/A;   Family History  Problem Relation Age of Onset  . Heart failure Mother     started in 41s  . Diabetes Father   . Lupus Sister   . Heart attack Maternal Grandfather   . Heart attack Maternal Uncle   . Heart attack Maternal Aunt   . Hypertension Maternal Aunt   . Stroke Neg Hx    Social History:  reports that he quit smoking about 17 years ago. His smoking use included Cigarettes. He has a 40.00 pack-year smoking history. He has never used smokeless tobacco. He reports that he does not drink alcohol or use drugs. Allergies  Allergen Reactions  . Diphenhydramine Itching    Only with IV doses. Tolerates oral.  . Penicillins Other (See Comments)    migraine  . Adhesive [Tape] Rash    Please use paper tape  . Amoxicillin Rash    migraine  . Other Rash    Please use paper tape  . Penicillin G Rash    headache   Prior to Admission medications   Medication Sig Start Date End Date Taking? Authorizing Provider  acetaminophen (TYLENOL) 500 MG tablet Take 1,000 mg by mouth every 6 (six) hours as needed for pain or fever.   Yes Historical Provider, MD  atorvastatin (LIPITOR) 40 MG tablet Take 40 mg by mouth daily. Pt takes 20 mg daily   Yes Historical Provider, MD  calcium acetate (PHOSLO) 667 MG  capsule Take 4,002 mg by mouth 3 (three) times daily with meals.    Yes Historical Provider, MD  citalopram (CELEXA) 20 MG tablet Take 20 mg by mouth at bedtime.    Yes Historical Provider, MD  clopidogrel (PLAVIX) 75 MG tablet Take 1 tablet (75 mg total) by mouth daily. 02/19/16  Yes Dayna N Dunn, PA-C  docusate sodium (COLACE) 100 MG capsule Take 300 mg by mouth 2 (two) times daily.   Yes Historical Provider, MD  folic acid (FOLVITE) 1 MG tablet Take 400 mcg by mouth daily.    Yes Historical Provider, MD  gabapentin (NEURONTIN) 300 MG capsule Take 300 mg by mouth at bedtime.  Yes Historical Provider, MD  metoprolol tartrate (LOPRESSOR) 25 MG tablet Take 1 tablet (25 mg total) by mouth 2 (two) times daily. 03/05/16  Yes Burnell Blanks, MD  multivitamin (RENA-VIT) TABS tablet Take 1 tablet by mouth daily. 01/11/13  Yes Samella Parr, NP  ranitidine (ZANTAC) 150 MG tablet Take 150 mg by mouth daily.   Yes Historical Provider, MD  sodium polystyrene (KAYEXALATE) 15 GM/60ML suspension Take 15 g by mouth See admin instructions. Only take 15 g on Sun / Tues / Thurs / Sat (non dialysis days)   Yes Historical Provider, MD  warfarin (COUMADIN) 7.5 MG tablet Take 3.75-7.5 mg by mouth as directed. Takes 1/2 tab on Tues, Sat 3.'75mg'$  Takes 1 tab all other days   Yes Historical Provider, MD  albuterol (PROAIR HFA) 108 (90 BASE) MCG/ACT inhaler Inhale 1-2 puffs into the lungs every 6 (six) hours as needed for shortness of breath.  07/19/11   Historical Provider, MD  calcitRIOL (ROCALTROL) 0.25 MCG capsule Take 1 capsule (0.25 mcg total) by mouth daily. 03/27/16   Robbie Lis, MD  nitroGLYCERIN (NITROSTAT) 0.4 MG SL tablet Place 0.4 mg under the tongue every 5 (five) minutes as needed for chest pain (Take one tabe every 5-10 minutes for chest pain. Once you have taken three tabs you need to notify your MD or go to ER if chest pain not gone.).    Historical Provider, MD   Current Facility-Administered  Medications  Medication Dose Route Frequency Provider Last Rate Last Dose  . acetaminophen (TYLENOL) tablet 650 mg  650 mg Oral Q6H PRN Norman Herrlich, MD       Or  . acetaminophen (TYLENOL) suppository 650 mg  650 mg Rectal Q6H PRN Norman Herrlich, MD      . albuterol (PROVENTIL HFA;VENTOLIN HFA) 108 (90 Base) MCG/ACT inhaler 1-2 puff  1-2 puff Inhalation Q6H PRN Norman Herrlich, MD      . atorvastatin (LIPITOR) tablet 40 mg  40 mg Oral Daily Norman Herrlich, MD      . calcium acetate (PHOSLO) capsule 4,002 mg  4,002 mg Oral TID WC Norman Herrlich, MD      . citalopram (CELEXA) tablet 20 mg  20 mg Oral QHS Norman Herrlich, MD      . clopidogrel (PLAVIX) tablet 75 mg  75 mg Oral Daily Norman Herrlich, MD      . docusate sodium (COLACE) capsule 300 mg  300 mg Oral BID Norman Herrlich, MD      . folic acid (FOLVITE) tablet 0.5 mg  400 mcg Oral Daily Norman Herrlich, MD      . gabapentin (NEURONTIN) capsule 300 mg  300 mg Oral QHS Norman Herrlich, MD      . metoprolol tartrate (LOPRESSOR) tablet 12.5 mg  12.5 mg Oral BID Norman Herrlich, MD      . warfarin (COUMADIN) tablet 7.5 mg  7.5 mg Oral ONCE-1800 Clent Ridges [place], [facility]      . Warfarin - Pharmacist Dosing Inpatient   Does not apply Protection, [facility]       Labs: Basic Metabolic Panel:  Recent Labs Lab 07/09/16 0759  NA 137  K 4.8  CL 98*  CO2 26  GLUCOSE 81  BUN 43*  CREATININE 7.22*  CALCIUM 7.3*   Liver Function Tests:  Recent Labs Lab 07/09/16 0759  AST 29  ALT 26  ALKPHOS 128*  BILITOT 0.5  PROT 7.5  ALBUMIN 3.5   CBC:  Recent Labs Lab 07/09/16 0759  WBC 7.5  NEUTROABS 6.0  HGB 12.4*  HCT 37.7*  MCV 101.9*  PLT 150   CBG:  Recent Labs Lab 07/09/16 1108  GLUCAP 100*   Studies/Results: Dg Chest Port 1 View  Result Date: 07/09/2016 CLINICAL DATA:  Dialysis dependent renal failure, transplant failure, recent episode of tachycardia, increased fatigue, history of coronary artery disease with stent  placement EXAM: PORTABLE CHEST 1 VIEW COMPARISON:  PA and lateral chest x-ray of March 24, 2016 FINDINGS: The lungs are well-expanded. The interstitial markings are increased bilaterally and slightly more conspicuous than in the past. There is no alveolar infiltrate. A trace of pleural fluid blunts the right lateral costophrenic angle. There is no pneumothorax. The cardiac silhouette is enlarged. There are sternal wires present. The uppermost sternal wire is chronically broken. There is calcification in the wall of the aortic arch. IMPRESSION: Findings consistent with low-grade CHF superimposed upon COPD. No alveolar pneumonia. Aortic atherosclerosis. Electronically Signed   By: David  Martinique M.D.   On: 07/09/2016 08:32    ROS: As per HPI otherwise negative.   Physical Exam: Vitals:   07/09/16 1310 07/09/16 1330 07/09/16 1331 07/09/16 1340  BP: 110/78 92/67  (!) 72/48  Pulse: (!) 130 (!) 132 (!) 131 (!) 131  Resp:   16   Temp:      TempSrc:      SpO2:   97%   Weight:         General: Pleasant, cooperative in moderate respiratory distress.  Head: Normocephalic, atraumatic, sclera non-icteric, mucus membranes are moist Neck: Supple. JVD mid way to mandible at 30 degrees.  Lungs: Bilateral breath sounds very decreased in bases-with fine bibasilar rales. + WOB present. No retractions.  Heart: X4,J2 2/6 systolic M. Aflutter on monitor. HR 130s.  Abdomen: Soft, non-tender, non-distended with normoactive bowel sounds. Old nephrectomy scar present.  M-S:  Strength and tone appear normal for age. Lower extremities: trace lower extremity edema. Old cellulitis scarring BLE.  Neuro: Alert and oriented X 3. Moves all extremities spontaneously. Psych:  Responds to questions appropriately with a normal affect. Dialysis Access: LFA AVF cannulated at present  Dialysis Orders: MWF [place] 4 hours 81 kg 1.0K/2.25 Ca 450/Auto 1.5  Linear Sodium UF profile 2 Heparin: 2000 units IV q Tx Mircera 50 mcg IV  q 2 weeks (last HGB 12.2 07/07/16) Venofer: 100 mg IV X 3 doses (1/3 given) then 50 mg IV weekly. (Ferritin 584 Fe 61 Tsat 26 06/30/16)  Assessment/Plan: 1.  CHF exacerbation. H/O grade 2 diastolic dysfunction. Urgent HD for volume removal. Fluid removal limited by hypotension. Initially unstable, now much improved.   2. H/O PAF/ Now AFlutter: HR 130 Aflutter per 12 lead. Gave 2 doses of metoprolol 2.5 mg IV without change in HR. Notified primary-probably needs cards consult. On coumadin per pharmacy.  3.  ESRD -  MWF [place]. Had 40 minutes of HD prior to coming to hospital. K+4.8 use 2.0 K bath.  4.  Hypertension/volume  - As noted above. BP now ^ 96/60.  Attempting UFG 5000. On metoprolol 25 mg PO BID for hx of PAF 5.  Anemia  - HGB 12.4. Cont Fe load.  6.  Metabolic bone disease -  Non-compliant with binders. He is prescribed Ca Acetate 667 mg 6 capsules PO TID! Last phos-9.0 7.  Nutrition -Albumin 3.5  Rita H. Owens Shark, NP-C 07/09/2016, 1:51 PM  [employer]  Beeper [phone]  Pt seen, examined and agree w A/P as above.  ESRD pt with CM/ hx CABG, aflutter here with hypotension, tachy/ aflutter and pulm edema.  Doing a little better on HD now.  Cont HD , max UF as tolerated.  Kelly Splinter MD [employer] pager [phone]   07/09/2016, 3:55 PM

## 2016-07-09 NOTE — ED Notes (Signed)
Attempted to call report to 3E, RN not ready. RN to call back.

## 2016-07-09 NOTE — ED Notes (Signed)
Pt fistula to left upper arm accessed PTA by dialysis. IV team consult has been placed.

## 2016-07-09 NOTE — ED Triage Notes (Signed)
Pt to ER BIB [facility] from [facility], pt received 40 minutes of dialysis today when staff noticed patient to be tachycardic and hypotensive. Pt reports exertional dyspnea for 2-3 days as well as increased fatigue. Pt is a/o x4. HR 120 ST at this time, BP 103/80, 95% on RA. Pertinent cardiac hx noted.

## 2016-07-09 NOTE — Procedures (Signed)
  I was present at this dialysis session, have reviewed the session itself and made  appropriate changes Kelly Splinter MD [place] pager [phone]   07/09/2016, 3:56 PM

## 2016-07-09 NOTE — Telephone Encounter (Signed)
Chart reviewed. Pt was transported to ED from dialysis

## 2016-07-09 NOTE — Progress Notes (Signed)
Pt stated he could not breath and ripped BIPAP mask off face.  Refuses to allow me to place back on. Placed pt on 4L [place].  SPO2 96%.

## 2016-07-09 NOTE — Progress Notes (Signed)
Patient arrived to unit per ED stretcher.  Reviewed treatment plan and this RN agrees.  Report received from bedside RN, Earnest Bailey.  Consent obtained.  Patient A & O X 4. Lung sounds diminished and coarse to ausculation in all fields. BLE 1+ pitting edema. Cardiac: ST to afib.  Prepped LLAVF with alcohol and cannulated with two 15 gauge needles.  Pulsation of blood noted.  Flushed access well with saline per protocol.  Connected and secured lines and initiated tx at 1154.  UF goal of 5000 mL and net fluid removal of 4500 mL.  Will continue to monitor.

## 2016-07-09 NOTE — Significant Event (Signed)
Rapid Response Event Note  Overview: Time Called: 5830 Arrival Time: 1318 Event Type: Respiratory  Initial Focused Assessment: Patient in acute respiratory distress, increased wob and increased RR. Patient currently receiving HD treatment, came in today via ED. Per patient he suddenly felt that he couldn't breath.   Lung sounds tight, mid to lower lobes, expiratory wheezes upper lobes. Heart tones regular.  2:1 AF 130s Hypotensive 80-90s Rita and Dr Jonnie Finner at bedside  Interventions: Xopenex treatment given.  Some improvement. Placed on Bipap. Patient completely comfortable Lung sounds still sound tight. Residents at bedside to assess patient AF 130  BP 89/55  RR  18  O2 sat 98% on Bipap Patient looked comfortable on bipap, ABG done Then he suddenly ripped off the mask stating he could not tolerate it any more. He states he feels much improved. Placed on 4l Noel O2 sats 96% Plan admit: SDU  Plan of Care (if not transferred): [facility] HD treatment. Call if patient has additional respiratory distress  Event Summary: Name of Physician Notified: Hulen Luster at 1330  Name of Consulting Physician Notified: Velva Harman NP and Dr Jonnie Finner at bedside at    Outcome: Other (Comment) (plan admit SDU)     Raliegh Ip

## 2016-07-09 NOTE — Progress Notes (Signed)
Dialysis treatment completed.  5000 mL ultrafiltrated and net fluid removal 4500 mL.    Patient sleepy, oriented X 4. Lung sounds diminished, coarse to ausculation in all fields. BLE 1+ edema. Cardiac: A flutter, rate of 130's sustaining.  Disconnected lines and removed needles.  Pressure held for 10 minutes and band aid/gauze dressing applied.  Report given to bedside RN, Junie Panning.    See prior nephrology note for RRT during HD treatment.

## 2016-07-09 NOTE — ED Notes (Signed)
Pt transported to Hemodialysis for treatment, bay 7. This RN agreed to call report as patient has bed on 3E.

## 2016-07-09 NOTE — ED Provider Notes (Signed)
[facility] DEPT Provider Note   CSN: [id number] Arrival date & time: 07/09/16  0736     History   Chief Complaint Chief Complaint  Patient presents with  . Tachycardia  . Hypotension    HPI [name] is a 60 y.o. male.  The history is provided by the patient and medical records.    60 year old male with history of anemia, prosthetic aortic valve, coronary artery disease status post stenting and CABG x2, end-stage renal disease on hemodialysis, MS, paroxysmal A. fib, sleep apnea, presenting to the ED for tachycardia and hypotension.  Patient states over the past few weeks he has become increasingly more SOB.  He states he has been feeling very short of breath with exertion and feels fatigued. He denies any exertional chest pain. He has been sleeping in a recliner for the past several months as he always feels short of breath when he lays flat on his back. States on Wednesday he went to dialysis and left 2 kg from his dry weight. States he was tachycardic around 120 during that session. States when he went in today he was up 5 kg from his dry weight (up 3kg from Wednesday).  States he got through approximately 40 minutes of his dialysis session today and noted that his blood pressure was low and his heart rate was in the 120s. Patient sent here for further evaluation.  States overall he feels fine here. He has no chest pain or shortness of breath at rest. Has no history of DVT or PE. He is anticoagulated with Coumadin. He has been compliant with his dialysis sessions. He is followed by cardiology, Dr. Angelena Form.  Last 2-D echo in July 2017, estimated EF 50-55%.  Past Medical History:  Diagnosis Date  . Acute blood loss anemia    a. 02/2016 due to groin hematoma. Rec 3 U PRBC.  Marland Kitchen Anemia   . Aortic heart valve prolapse 04/2013   a. s/p bioprosthetic AVR at time of CABG.  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 Bifascicular block   . CAD (coronary artery disease)  04/2013   a. 04/2013: s/p CABG x 2 (Y SVG -LAD & D2). b. 01/2015: NSTEMI s/p DES to LCx, BMS to SVG-LAD. c. NSTEMI 05/2015 during AF/AFL - non-flow limiting FFR. d. Low risk nuc 3/17. e. STEMI 6/17 after coming off Plavix, s/p DES to SVG-LAD into native vessel.  . Carotid artery disease ([place])    a. 1-39% stenosis bilaterally in 06/2015.   Marland Kitchen ESRD on hemodialysis 02/12/2012   a. 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 b. He had a renal Tx from 2008 to 2011, but subsequent rejection - Gets HD in [place], [place] on MWF schedule.    . Hematoma    a. Large right groin hematoma after cath 02/2016 with associated ABL anemia.  . Hypertension   . Multiple sclerosis ([place])   . Multiple sclerosis ([place])   . Nocturnal hypoxemia    a. Pt has home o2 QHS.  . On home oxygen therapy    pt states he has not been wearing it  . PAF (paroxysmal atrial fibrillation) ([place])   . Paroxysmal atrial flutter ([place])    a. During 05/2015 admission - SVT, atrial flutter, and PAF.  Marland Kitchen Peripheral vascular disease ([place])    Cath in 01/2015 required 45 cm Destination [place]  . Renal transplant failure and rejection   . S/P CABG x 2 04/2013   s/p CABG x 2 (Y  SVG -LAD & D2);   Marland Kitchen Sleep apnea    a. intolerant of bipap.  Marland Kitchen SVT (supraventricular tachycardia) ([place])   . Valvular heart disease    a. 2D echo 05/2015: EF 55-60%, no RWMA, mild AS/mild AI, mod MS, severely dilated LA.    Patient Active Problem List   Diagnosis Date Noted  . Emphysema/COPD ([place]) 03/24/2016  . Elevated troponin 03/24/2016  . Diastolic CHF, acute on chronic (H[facility]) 03/24/2016  . SOB (shortness of breath) 03/24/2016  . Left leg swelling   . Macrocytic anemia   . Acute blood loss anemia 02/19/2016  . Groin hematoma 02/19/2016  . NSVT (nonsustained ventricular tachycardia) ([place]) 02/19/2016  . Bifascicular block   . Nocturnal hypoxemia   . Chronic anticoagulation-Coumadin 02/15/2016  . History of tissue AVR 2014 02/15/2016  . Hx of CABG x 2 2014 02/15/2016   . Acute ST elevation myocardial infarction (STEMI) ([place]) 02/15/2016  . Encounter for therapeutic drug monitoring 06/04/2015  . Paroxysmal atrial flutter ([place]) 06/04/2015  . SVT (supraventricular tachycardia) ([place]) 06/04/2015  . Anemia   . CAD- s/p CABG in Aug 2014 with Y SVG to LAD & D2; NSTEMI 01/2015 [facility] s/p DES to LCx and BMS to SVG-LAD, NSTEMI 05/2015 non-flow limiting FFR of Cx, STEMI 02/2016 s/p angioplasty/DES to SVG-LAD   . PAF (paroxysmal atrial fibrillation) ([place]) 02/21/2015  . CAD of artery bypass graft-occluded SVG-LAD 02/15/16   . Essential (primary) hypertension   . Non-ST elevation MI (NSTEMI) ([place]) 01/09/2013  . OSA -not compliant with C-pap 01/09/2013  . GPC Bacteremia 11/14/2012  . Renal transplant failure and rejection 02/14/2012  . Multiple sclerosis ([place]) 02/14/2012  . Thrombocytopenia ([place]) 02/12/2012  . Hypotension 02/12/2012  . Syncope and collapse 02/12/2012  . Aortic regurgitation 02/12/2012  . ESRD on hemodialysis 02/12/2012    Past Surgical History:  Procedure Laterality Date  . AV FISTULA PLACEMENT    . CARDIAC CATHETERIZATION N/A 05/26/2015   Procedure: Left Heart Cath and Coronary Angiography;  Surgeon: Leonie Man, MD;  Location: [facility];  Service: Cardiovascular;  Laterality: N/A;  . CARDIAC CATHETERIZATION N/A 05/26/2015   Procedure: Intravascular Pressure Wire/FFR Study;  Surgeon: Leonie Man, MD;  Location: [facility];  Service: Cardiovascular;  Laterality: N/A;  . CARDIAC CATHETERIZATION N/A 02/15/2016   Procedure: Left Heart Cath and Coronary Angiography;  Surgeon: Wellington Hampshire, MD;  Location: [facility];  Service: Cardiovascular;  Laterality: N/A;  . excised squamous cells at rectum  2006  . flash     flash pulmonary edema  . HERNIA REPAIR  07/2011  . KIDNEY TRANSPLANT  08/2007  . LEFT HEART CATHETERIZATION WITH CORONARY ANGIOGRAM N/A 01/09/2013   Procedure: LEFT HEART CATHETERIZATION WITH CORONARY ANGIOGRAM;   Surgeon: Burnell Blanks, MD;  Location[facility] CATH LAB;  Service: Cardiovascular;  Laterality: N/A;       Home Medications    Prior to Admission medications   Medication Sig Start Date End Date Taking? Authorizing Provider  acetaminophen (TYLENOL) 500 MG tablet Take 1,000 mg by mouth every 6 (six) hours as needed for pain or fever.    Historical Provider, MD  albuterol (PROAIR HFA) 108 (90 BASE) MCG/ACT inhaler Inhale 1-2 puffs into the lungs every 6 (six) hours as needed for shortness of breath.  07/19/11   Historical Provider, MD  atorvastatin (LIPITOR) 40 MG tablet Take 40 mg by mouth daily. Pt takes 20 mg daily    Historical Provider, MD  calcitRIOL (ROCALTROL) 0.25 MCG capsule Take 1 capsule (0.25 mcg total) by mouth daily. 03/27/16   Robbie Lis, MD  calcium acetate (PHOSLO) 667 MG capsule Take 4,002 mg by mouth 3 (three) times daily with meals.     Historical Provider, MD  citalopram (CELEXA) 20 MG tablet Take 20 mg by mouth at bedtime.     Historical Provider, MD  clopidogrel (PLAVIX) 75 MG tablet Take 1 tablet (75 mg total) by mouth daily. 02/19/16   Dayna N Dunn, PA-C  docusate sodium (COLACE) 100 MG capsule Take 300 mg by mouth 2 (two) times daily.    Historical Provider, MD  folic acid (FOLVITE) 1 MG tablet Take 1 mg by mouth daily.    Historical Provider, MD  gabapentin (NEURONTIN) 300 MG capsule Take 300 mg by mouth at bedtime.     Historical Provider, MD  metoprolol tartrate (LOPRESSOR) 25 MG tablet Take 1 tablet (25 mg total) by mouth 2 (two) times daily. 03/05/16   Burnell Blanks, MD  multivitamin (RENA-VIT) TABS tablet Take 1 tablet by mouth daily. 01/11/13   Samella Parr, NP  nitroGLYCERIN (NITROSTAT) 0.4 MG SL tablet Place 0.4 mg under the tongue every 5 (five) minutes as needed for chest pain (Take one tabe every 5-10 minutes for chest pain. Once you have taken three tabs you need to notify your MD or go to ER if chest pain not gone.).    Historical Provider,  MD  ranitidine (ZANTAC) 150 MG tablet Take 150 mg by mouth daily.    Historical Provider, MD  sodium polystyrene (KAYEXALATE) 15 GM/60ML suspension Take 15 g by mouth See admin instructions. Only take 15 g on Sun / Tues / Thurs / Sat (non dialysis days)    Historical Provider, MD  warfarin (COUMADIN) 7.5 MG tablet Take 3.75-7.5 mg by mouth as directed. Takes 1/2 tab on Tues, Sat 3.'75mg'$  Takes 1 tab all other days    Historical Provider, MD  warfarin (COUMADIN) 7.5 MG tablet Take 1/2-1 tablet by mouth daily as directed by coumadin clinic 04/01/16   Consuelo Pandy, PA-C    Family History Family History  Problem Relation Age of Onset  . Heart failure Mother     started in 70s  . Diabetes Father   . Lupus Sister   . Heart attack Maternal Grandfather   . Heart attack Maternal Uncle   . Heart attack Maternal Aunt   . Hypertension Maternal Aunt   . Stroke Neg Hx     Social History Social History  Substance Use Topics  . Smoking status: Former Smoker    Packs/day: 2.00    Years: 20.00    Types: Cigarettes    Quit date: 08/06/1998  . Smokeless tobacco: Never Used  . Alcohol use No     Allergies   Diphenhydramine; Penicillins; Adhesive [tape]; Amoxicillin; Other; and Penicillin g   Review of Systems Review of Systems  Respiratory: Positive for shortness of breath.   All other systems reviewed and are negative.    Physical Exam Updated Vital Signs BP 103/80 (BP Location: Right Arm)   Pulse (!) 124   Temp 98.2 F (36.8 C) (Oral)   Resp 22   SpO2 97%   Physical Exam  Constitutional: He is oriented to person, place, and time. He appears well-developed and well-nourished.  HENT:  Head: Normocephalic and atraumatic.  Mouth/Throat: Oropharynx is clear and moist.  Eyes: Conjunctivae and EOM are normal. Pupils are equal, round, and reactive  to light.  Neck: Normal range of motion.  Cardiovascular: Normal rate, regular rhythm and normal heart sounds.   Pulmonary/Chest:  Effort normal. He has rales.  Rales noted bilaterally, worse at bases; speaking in full sentences without difficulty  Abdominal: Soft. Bowel sounds are normal.  Musculoskeletal: Normal range of motion.  Fistula left forearm which is currently accessed, thrill present 1+ edema of the ankles; no calf pain or tenderness  Neurological: He is alert and oriented to person, place, and time.  Skin: Skin is warm and dry.  Psychiatric: He has a normal mood and affect.  Nursing note and vitals reviewed.    ED Treatments / Results  Labs (all labs ordered are listed, but only abnormal results are displayed) Labs Reviewed  CBC WITH DIFFERENTIAL/PLATELET - Abnormal; Notable for the following:       Result Value   RBC 3.70 (*)    Hemoglobin 12.4 (*)    HCT 37.7 (*)    MCV 101.9 (*)    RDW 16.0 (*)    Lymphs Abs 0.5 (*)    All other components within normal limits  COMPREHENSIVE METABOLIC PANEL - Abnormal; Notable for the following:    Chloride 98 (*)    BUN 43 (*)    Creatinine, Ser 7.22 (*)    Calcium 7.3 (*)    Alkaline Phosphatase 128 (*)    GFR calc non Af Amer 7 (*)    GFR calc Af Amer 8 (*)    All other components within normal limits  PROTIME-INR - Abnormal; Notable for the following:    Prothrombin Time 28.6 (*)    All other components within normal limits  BRAIN NATRIURETIC PEPTIDE - Abnormal; Notable for the following:    B Natriuretic Peptide 2,414.3 (*)    All other components within normal limits  I-STAT TROPOININ, ED    EKG  EKG Interpretation  Date/Time:  Friday July 09 2016 07:41:39 EDT Ventricular Rate:  123 PR Interval:    QRS Duration: 114 QT Interval:  377 QTC Calculation: 538 R Axis:   -20 Text Interpretation:  Sinus tachycardia Incomplete right bundle branch block Anterior infarct, old Minimal ST elevation, inferior leads No significant change since last tracing Confirmed by YAO  MD, DAVID ([id number]) on 07/09/2016 7:44:38 AM       Radiology Dg Chest  Port 1 View  Result Date: 07/09/2016 CLINICAL DATA:  Dialysis dependent renal failure, transplant failure, recent episode of tachycardia, increased fatigue, history of coronary artery disease with stent placement EXAM: PORTABLE CHEST 1 VIEW COMPARISON:  PA and lateral chest x-ray of March 24, 2016 FINDINGS: The lungs are well-expanded. The interstitial markings are increased bilaterally and slightly more conspicuous than in the past. There is no alveolar infiltrate. A trace of pleural fluid blunts the right lateral costophrenic angle. There is no pneumothorax. The cardiac silhouette is enlarged. There are sternal wires present. The uppermost sternal wire is chronically broken. There is calcification in the wall of the aortic arch. IMPRESSION: Findings consistent with low-grade CHF superimposed upon COPD. No alveolar pneumonia. Aortic atherosclerosis. Electronically Signed   By: David  [place] M.D.   On: 07/09/2016 08:32    Procedures Procedures (including critical care time)  Medications Ordered in ED Medications - No data to display   Initial Impression / Assessment and Plan / ED Course  I have reviewed the triage vital signs and the nursing notes.  Pertinent labs & imaging results that were available during my care of the patient  were reviewed by me and considered in my medical decision making (see chart for details).  Clinical Course   60 year old male sent here from dialysis for tachycardia and hypotension. Patient's heart rate has been in the 120s for the past week. He reports progressive shortness of breath over the past several weeks. Here he is afebrile and nontoxic. He is in no acute respiratory distress. He is tachycardic around 125. He does have Rales throughout, worse at the bases. He reports he is 5kg from his dry weight. Suspect CHF/hypervolemia. Workup is consistent with such. His troponin is negative. His INR is therapeutic, feel PE less likely given this. Patient was given dose of  Lopressor here without change in heart rate. His tachycardia may be due to his fluid overload. Patient will likely need admission and dialysis here for close monitoring.  Discuss with nephrology, Dr. Jonnie Finner-- will arrange for dialysis today.  Discussed with IM teaching service, will admit for ongoing care.  Request to evaluate patient in ED prior to bed placement.  Final Clinical Impressions(s) / ED Diagnoses   Final diagnoses:  Other hypervolemia  Tachycardia    New Prescriptions New Prescriptions   No medications on file     Larene Pickett, Hershal Coria 07/09/16 [place] Yao, MD 07/12/16 1054

## 2016-07-09 NOTE — Consult Note (Deleted)
Renal Service Consult Note [facility]  [name] 07/09/2016 [name] Requesting Physician:  [name]  Reason for Consult:  ESRD pt w pulm edema HPI: The patient is a 60 y.o. year-old with history of CAD/ CABG '14, PAF, OSA / home O2, and ESRD on HD since 2011-11-21. Sent to ED from HD today for acute SOB.  Pt reports DOE for the last several days and increased fatigue.  CXR in ED shows diffuse IS pulm edema.  Asked to see for HD.    Patient says he doesn't miss dialysis, always goes to treatments, no recent wt loss/ illness, etc..  On transplant list.  Has substernal CP today, radiation, no n/v/diaphoresis.  7/10. Denies hx of MI.  On MTP I believe for afib.  Has hx CABG '14.  In June this year had heart cath showing acute occlusion of the graft to the LAD, this was stented and was a difficult procedure; also had patent LCX stent w moderate ISR, moderate RCA disease and patent Y graft to moderate OM stenosis.    EKG no ischemic changes, sinus tach 123, old anterior MI. troponin < 0.03.  BNP 2414.   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  Moved to [place] in 11-21-06 when his mother died.  Was married, has 4 grown sons who live in [place], is separated from his wife.  Lives in [place], active, drives himself to dialysis.     ROS  no joint pain   no HA  no blurry vision  no rash  no diarrhea  no nausea/ vomiting  no dysuria  no difficulty voiding  no change in urine color    Past Medical History  Past Medical History:  Diagnosis Date  . Acute blood loss anemia    a. 02/2016 due to groin hematoma. Rec 3 U PRBC.  Marland Kitchen Anemia   . Aortic heart valve prolapse 04/2013   a. s/p bioprosthetic AVR at time of CABG.  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 Bifascicular block   . CAD (coronary artery disease) 04/2013   a. 04/2013: s/p CABG x 2 (Y SVG -LAD &  D2). b. 01/2015: NSTEMI s/p DES to LCx, BMS to SVG-LAD. c. NSTEMI 05/2015 during AF/AFL - non-flow limiting FFR. d. Low risk nuc 3/17. e. STEMI 6/17 after coming off Plavix, s/p DES to SVG-LAD into native vessel.  . Carotid artery disease ([place])    a. 1-39% stenosis bilaterally in 06/2015.   Marland Kitchen ESRD on hemodialysis 02/12/2012   a. 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 b. He had a renal Tx from 11-21-06 to 11-20-2009, but subsequent rejection - Gets HD in [place], [place] on MWF schedule.    . Hematoma    a. Large right groin hematoma after cath 02/2016 with associated ABL anemia.  . Hypertension   . Multiple sclerosis ([place])   . Multiple sclerosis ([place])   . Nocturnal hypoxemia    a. Pt has home o2 QHS.  . On home oxygen therapy    pt states he has not been wearing it  . PAF (paroxysmal atrial fibrillation) ([place])   . Paroxysmal atrial flutter ([place])    a. During 05/2015 admission - SVT, atrial flutter, and PAF.  Marland Kitchen Peripheral vascular disease ([place])    Cath in 01/2015 required 45 cm Destination [place]  . Renal transplant failure and  rejection   . 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 intolerant of bipap.  Marland Kitchen SVT (supraventricular tachycardia) ([place])   . Valvular heart disease    a. 2D echo 05/2015: EF 55-60%, no RWMA, mild AS/mild AI, mod MS, severely dilated LA.   Past Surgical History  Past Surgical History:  Procedure Laterality Date  . AV FISTULA PLACEMENT    . CARDIAC CATHETERIZATION N/A 05/26/2015   Procedure: Left Heart Cath and Coronary Angiography;  Surgeon: Cameron Man, MD;  Location: [facility];  Service: Cardiovascular;  Laterality: N/A;  . CARDIAC CATHETERIZATION N/A 05/26/2015   Procedure: Intravascular Pressure Wire/FFR Study;  Surgeon: Cameron Man, MD;  Location: [facility];  Service: Cardiovascular;  Laterality: N/A;  . CARDIAC CATHETERIZATION N/A 02/15/2016   Procedure: Left Heart Cath and Coronary Angiography;  Surgeon:  Cameron Hampshire, MD;  Location: [facility];  Service: Cardiovascular;  Laterality: N/A;  . excised squamous cells at rectum  2006  . flash     flash pulmonary edema  . HERNIA REPAIR  07/2011  . KIDNEY TRANSPLANT  08/2007  . LEFT HEART CATHETERIZATION WITH CORONARY ANGIOGRAM N/A 01/09/2013   Procedure: LEFT HEART CATHETERIZATION WITH CORONARY ANGIOGRAM;  Surgeon: Burnell Blanks, MD;  Location: [facility] CATH LAB;  Service: Cardiovascular;  Laterality: N/A;   Family History  Family History  Problem Relation Age of Onset  . Heart failure Mother     started in 41s  . Diabetes Father   . Lupus Sister   . Heart attack Maternal Grandfather   . Heart attack Maternal Uncle   . Heart attack Maternal Aunt   . Hypertension Maternal Aunt   . Stroke Neg Hx    Social History  reports that he quit smoking about 17 years ago. His smoking use included Cigarettes. He has a 40.00 pack-year smoking history. He has never used smokeless tobacco. He reports that he does not drink alcohol or use drugs. Allergies  Allergies  Allergen Reactions  . Diphenhydramine Itching    Only with IV doses. Tolerates oral.  . Penicillins Other (See Comments)    migraine  . Adhesive [Tape] Rash    Please use paper tape  . Amoxicillin Rash    migraine  . Other Rash    Please use paper tape  . Penicillin G Rash    headache   Home medications Prior to Admission medications   Medication Sig Start Date End Date Taking? Authorizing Provider  acetaminophen (TYLENOL) 500 MG tablet Take 1,000 mg by mouth every 6 (six) hours as needed for pain or fever.   Yes Historical Provider, MD  atorvastatin (LIPITOR) 40 MG tablet Take 40 mg by mouth daily. Pt takes 20 mg daily   Yes Historical Provider, MD  calcium acetate (PHOSLO) 667 MG capsule Take 4,002 mg by mouth 3 (three) times daily with meals.    Yes Historical Provider, MD  citalopram (CELEXA) 20 MG tablet Take 20 mg by mouth at bedtime.    Yes Historical Provider,  MD  clopidogrel (PLAVIX) 75 MG tablet Take 1 tablet (75 mg total) by mouth daily. 02/19/16  Yes Dayna N Dunn, PA-C  docusate sodium (COLACE) 100 MG capsule Take 300 mg by mouth 2 (two) times daily.   Yes Historical Provider, MD  folic acid (FOLVITE) 1 MG tablet Take 400 mcg by mouth daily.  Yes Historical Provider, MD  gabapentin (NEURONTIN) 300 MG capsule Take 300 mg by mouth at bedtime.    Yes Historical Provider, MD  metoprolol tartrate (LOPRESSOR) 25 MG tablet Take 1 tablet (25 mg total) by mouth 2 (two) times daily. 03/05/16  Yes Burnell Blanks, MD  multivitamin (RENA-VIT) TABS tablet Take 1 tablet by mouth daily. 01/11/13  Yes Samella Parr, NP  ranitidine (ZANTAC) 150 MG tablet Take 150 mg by mouth daily.   Yes Historical Provider, MD  sodium polystyrene (KAYEXALATE) 15 GM/60ML suspension Take 15 g by mouth See admin instructions. Only take 15 g on Sun / Tues / Thurs / Sat (non dialysis days)   Yes Historical Provider, MD  warfarin (COUMADIN) 7.5 MG tablet Take 3.75-7.5 mg by mouth as directed. Takes 1/2 tab on Tues, Sat 3.'75mg'$  Takes 1 tab all other days   Yes Historical Provider, MD  albuterol (PROAIR HFA) 108 (90 BASE) MCG/ACT inhaler Inhale 1-2 puffs into the lungs every 6 (six) hours as needed for shortness of breath.  07/19/11   Historical Provider, MD  calcitRIOL (ROCALTROL) 0.25 MCG capsule Take 1 capsule (0.25 mcg total) by mouth daily. 03/27/16   Robbie Lis, MD  nitroGLYCERIN (NITROSTAT) 0.4 MG SL tablet Place 0.4 mg under the tongue every 5 (five) minutes as needed for chest pain (Take one tabe every 5-10 minutes for chest pain. Once you have taken three tabs you need to notify your MD or go to ER if chest pain not gone.).    Historical Provider, MD   Liver Function Tests  Recent Labs Lab 07/09/16 0759  AST 29  ALT 26  ALKPHOS 128*  BILITOT 0.5  PROT 7.5  ALBUMIN 3.5   No results for input(s): LIPASE, AMYLASE in the last 168 hours. CBC  Recent Labs Lab  07/09/16 0759  WBC 7.5  NEUTROABS 6.0  HGB 12.4*  HCT 37.7*  MCV 101.9*  PLT 601   Basic Metabolic Panel  Recent Labs Lab 07/09/16 0759  NA 137  K 4.8  CL 98*  CO2 26  GLUCOSE 81  BUN 43*  CREATININE 7.22*  CALCIUM 7.3*   Iron/TIBC/Ferritin/ %Sat No results found for: IRON, TIBC, FERRITIN, IRONPCTSAT  Vitals:   07/09/16 0744  BP: 103/80  Pulse: (!) 124  Resp: 22  Temp: 98.2 F (36.8 C)  TempSrc: Oral  SpO2: 97%   Exam Gen awake, alert, no distress No rash, cyanosis or gangrene Sclera anicteric, throat clear  No jvd or bruits Chest bibasilar rales RRR 2/6 sem , no RG Abd soft ntnd no mass or ascites +bs GU normal male MS no joint effusions or deformity Ext no LE edema / no wounds or ulcers Neuro is alert, Ox 3 , nf L arm AVF +bruit    Dialysis: MWF  4h   81kg  Hep 2000 L arm AVF Mircera 50 ug q 4wks, last 10/25 Venofer 100/hd x 10, started 11/1 Hb 12.2    Ca 7.7  P 9  pth 9  Assessment: 1. SOB/ pulm edema - not sure vol overload, cardiac ischemia.  Having CP but neg trop. EKG suggests old ant MI.  Unusual for him to have high wt gains.  Need proper weight pre HD. HD asap today, get vol down.   2.  Hypotension - not sure cause, did get MTP IV this am for high heart rate 3. PAF - sinus rhythm now, sinus tach 4. CAD sp CABG '14 - sp PCI this  summer w stent to acutely occluded SVG to LAD 5. ESRD on HD MWF 6. Anemia getting low dose esa and IV Fe load, Hb up  Plan - acute HD this am, UF as tolerated.   Kelly Splinter MD [employer] pager [phone]   07/09/2016, 9:38 AM

## 2016-07-10 DIAGNOSIS — Z7901 Long term (current) use of anticoagulants: Secondary | ICD-10-CM

## 2016-07-10 DIAGNOSIS — Z87891 Personal history of nicotine dependence: Secondary | ICD-10-CM

## 2016-07-10 DIAGNOSIS — Z833 Family history of diabetes mellitus: Secondary | ICD-10-CM

## 2016-07-10 DIAGNOSIS — Z79899 Other long term (current) drug therapy: Secondary | ICD-10-CM

## 2016-07-10 DIAGNOSIS — Z880 Allergy status to penicillin: Secondary | ICD-10-CM

## 2016-07-10 DIAGNOSIS — I5033 Acute on chronic diastolic (congestive) heart failure: Secondary | ICD-10-CM

## 2016-07-10 DIAGNOSIS — Z881 Allergy status to other antibiotic agents status: Secondary | ICD-10-CM

## 2016-07-10 DIAGNOSIS — Z888 Allergy status to other drugs, medicaments and biological substances status: Secondary | ICD-10-CM

## 2016-07-10 DIAGNOSIS — Z8249 Family history of ischemic heart disease and other diseases of the circulatory system: Secondary | ICD-10-CM

## 2016-07-10 DIAGNOSIS — Z91048 Other nonmedicinal substance allergy status: Secondary | ICD-10-CM

## 2016-07-10 DIAGNOSIS — I4891 Unspecified atrial fibrillation: Secondary | ICD-10-CM

## 2016-07-10 DIAGNOSIS — Z823 Family history of stroke: Secondary | ICD-10-CM

## 2016-07-10 DIAGNOSIS — Z8269 Family history of other diseases of the musculoskeletal system and connective tissue: Secondary | ICD-10-CM

## 2016-07-10 DIAGNOSIS — Z7902 Long term (current) use of antithrombotics/antiplatelets: Secondary | ICD-10-CM

## 2016-07-10 LAB — RENAL FUNCTION PANEL
Albumin: 3.1 g/dL — ABNORMAL LOW (ref 3.5–5.0)
Anion gap: 13 (ref 5–15)
BUN: 28 mg/dL — AB (ref 6–20)
CHLORIDE: 96 mmol/L — AB (ref 101–111)
CO2: 28 mmol/L (ref 22–32)
Calcium: 7.6 mg/dL — ABNORMAL LOW (ref 8.9–10.3)
Creatinine, Ser: 5.56 mg/dL — ABNORMAL HIGH (ref 0.61–1.24)
GFR calc Af Amer: 12 mL/min — ABNORMAL LOW (ref >60)
GFR, EST NON AFRICAN AMERICAN: 10 mL/min — AB (ref >60)
Glucose, Bld: 82 mg/dL (ref 65–99)
POTASSIUM: 4.1 mmol/L (ref 3.5–5.1)
Phosphorus: 5.8 mg/dL — ABNORMAL HIGH (ref 2.5–4.6)
Sodium: 137 mmol/L (ref 135–145)

## 2016-07-10 LAB — PROTIME-INR
INR: 2.76
PROTHROMBIN TIME: 29.8 s — AB (ref 11.4–15.2)

## 2016-07-10 LAB — CBC
HEMATOCRIT: 36.2 % — AB (ref 39.0–52.0)
Hemoglobin: 11.6 g/dL — ABNORMAL LOW (ref 13.0–17.0)
MCH: 33 pg (ref 26.0–34.0)
MCHC: 32 g/dL (ref 30.0–36.0)
MCV: 103.1 fL — AB (ref 78.0–100.0)
Platelets: 152 10*3/uL (ref 150–400)
RBC: 3.51 MIL/uL — ABNORMAL LOW (ref 4.22–5.81)
RDW: 15.9 % — AB (ref 11.5–15.5)
WBC: 7.4 10*3/uL (ref 4.0–10.5)

## 2016-07-10 MED ORDER — WARFARIN SODIUM 7.5 MG PO TABS
3.7500 mg | ORAL_TABLET | Freq: Once | ORAL | Status: AC
  Administered 2016-07-10: 3.75 mg via ORAL
  Filled 2016-07-10: qty 0.5

## 2016-07-10 NOTE — Progress Notes (Signed)
Patient Name: [name] Date of Encounter: 07/10/2016  Hospital Problem List     Principal Problem:   CHF (congestive heart failure) ([place]) Active Problems:   Hypotension   Renal transplant failure and rejection   PAF (paroxysmal atrial fibrillation) (H[facility])   ESRD on hemodialysis   SVT (supraventricular tachycardia) ([facility])   Hx of CABG x 2 2014   CAD (coronary artery disease)   Acute CHF [facility])    Patient Profile     [name] is a 60 y.o. male with an extensive hx of CAD s/p 2V CABG in 2014 (Y SVG to LAD & D2), NSTEMI 01/2015 s/p DES to LCx and BMS to SVG-LAD at [facility], STEMI 02/2016 s/p PTCA/DES to SVG-LAD extending in native vessel. He has ESRD on HD, atrial fib, bioprosthetic AVR, moderate MR.  He was admitted with increased SOB.   Subjective   He feels OK .  No acute distress.    Inpatient Medications    . atorvastatin  40 mg Oral q1800  . calcium acetate  4,002 mg Oral TID WC  . citalopram  20 mg Oral QHS  . clopidogrel  75 mg Oral Daily  . docusate sodium  300 mg Oral BID  . [START ON 07/12/2016] ferric gluconate (FERRLECIT/NULECIT) IV  125 mg Intravenous Q M,W,F-HD  . folic acid  035 mcg Oral Daily  . gabapentin  300 mg Oral QHS  . metoprolol tartrate  12.5 mg Oral BID  . Warfarin - Pharmacist Dosing Inpatient   Does not apply q1800    Vital Signs    Vitals:   07/10/16 0300 07/10/16 0400 07/10/16 0500 07/10/16 0718  BP: 93/78 94/79 (!) 85/66 94/70  Pulse: 88     Resp: 19 (!) '22 18 16  '$ Temp: 98.2 F (36.8 C) 98 F (36.7 C)  97.9 F (36.6 C)  TempSrc: Oral Oral  Oral  SpO2: 95% 92% 93% 95%  Weight: 177 lb 7.5 oz (80.5 kg)     Height:        Intake/Output Summary (Last 24 hours) at 07/10/16 0916 Last data filed at 07/10/16 0600  Gross per 24 hour  Intake           390.25 ml  Output             4500 ml  Net         -4109.75 ml   Filed Weights   07/09/16 1530 07/09/16 1703 07/10/16 0300  Weight: 179 lb 3.7 oz (81.3 kg) 176 lb 12.9 oz (80.2 kg) 177  lb 7.5 oz (80.5 kg)    Physical Exam    GEN: Well nourished, well developed, in  no acute distress.  Neck: Supple, no JVD, carotid bruits, or masses. Cardiac: Irregular, no rubs, or gallops. No clubbing, cyanosis, no edema.  Radials/DP/PT 2+ and equal bilaterally.  Respiratory:  Respirations  regular and unlabored, clear to auscultation bilaterally. GI: Soft, nontender, nondistended, BS + x 4. Neuro:  Strength and sensation are intact.   Labs    CBC  Recent Labs  07/09/16 0759 07/10/16 0305  WBC 7.5 7.4  NEUTROABS 6.0  --   HGB 12.4* 11.6*  HCT 37.7* 36.2*  MCV 101.9* 103.1*  PLT 150 597   Basic Metabolic Panel  Recent Labs  07/09/16 0759 07/10/16 0305  NA 137 137  K 4.8 4.1  CL 98* 96*  CO2 26 28  GLUCOSE 81 82  BUN 43* 28*  CREATININE 7.22* 5.56*  CALCIUM  7.3* 7.6*  PHOS  --  5.8*   Liver Function Tests  Recent Labs  07/09/16 0759 07/10/16 0305  AST 29  --   ALT 26  --   ALKPHOS 128*  --   BILITOT 0.5  --   PROT 7.5  --   ALBUMIN 3.5 3.1*   No results for input(s): LIPASE, AMYLASE in the last 72 hours. Cardiac Enzymes No results for input(s): CKTOTAL, CKMB, CKMBINDEX, TROPONINI in the last 72 hours. BNP Invalid input(s): POCBNP D-Dimer No results for input(s): DDIMER in the last 72 hours. Hemoglobin A1C No results for input(s): HGBA1C in the last 72 hours. Fasting Lipid Panel No results for input(s): CHOL, HDL, LDLCALC, TRIG, CHOLHDL, LDLDIRECT in the last 72 hours. Thyroid Function Tests No results for input(s): TSH, T4TOTAL, T3FREE, THYROIDAB in the last 72 hours.  Invalid input(s): FREET3  Telemetry    Atrial fib with RVR  ECG    NA  Radiology    Dg Chest Port 1 View  Result Date: 07/09/2016 CLINICAL DATA:  Dialysis dependent renal failure, transplant failure, recent episode of tachycardia, increased fatigue, history of coronary artery disease with stent placement EXAM: PORTABLE CHEST 1 VIEW COMPARISON:  PA and lateral chest  x-ray of March 24, 2016 FINDINGS: The lungs are well-expanded. The interstitial markings are increased bilaterally and slightly more conspicuous than in the past. There is no alveolar infiltrate. A trace of pleural fluid blunts the right lateral costophrenic angle. There is no pneumothorax. The cardiac silhouette is enlarged. There are sternal wires present. The uppermost sternal wire is chronically broken. There is calcification in the wall of the aortic arch. IMPRESSION: Findings consistent with low-grade CHF superimposed upon COPD. No alveolar pneumonia. Aortic atherosclerosis. Electronically Signed   By: David  Martinique M.D.   On: 07/09/2016 08:32    Assessment & Plan    ATRIAL FIB:  RVR still on IV amiodarone.  If he is still in this on Monday I would plan DCCV.  He has been on therapeutic warfarin.  CAD:  No evidence of ischemia.  Continue current therapy.  ACUTE ON CHRONIC DIASTOLIC HF:  Volume management per HD.   AVR:  Echo was OK in June. Continue to follow.      Signed, Minus Breeding, MD  07/10/2016, 9:16 AM

## 2016-07-10 NOTE — Progress Notes (Deleted)
ANTICOAGULATION CONSULT NOTE - Follow-up Consult  Pharmacy Consult for Coumadin Indication: atrial fibrillation  Patient Measurements: Height: '5\' 10"'$  (177.8 cm) Weight: 177 lb 7.5 oz (80.5 kg) IBW/kg (Calculated) : 73  Vital Signs: Temp: 97.6 F (36.4 C) (11/04 1134) Temp Source: Oral (11/04 1134) BP: 85/75 (11/04 1134) Pulse Rate: 88 (11/04 0300)  Labs:  Recent Labs  07/09/16 0759 07/10/16 0305  HGB 12.4* 11.6*  HCT 37.7* 36.2*  PLT 150 152  LABPROT 28.6* 29.8*  INR 2.63 2.76  CREATININE 7.22* 5.56*    Estimated Creatinine Clearance: 14.4 mL/min (by C-G formula based on SCr of 5.56 mg/dL (H)).  Assessment: [name] w/ESRD-HD MWF and Hx of afib on Coumadin. Remains therapeutic on home dose (2.76). CBC wnl.  PTA: 7.'5mg'$  daily except 3.'75mg'$  on Tues/Sat - last taken 11/2  Goal of Therapy:  INR 2-3 Monitor platelets by anticoagulation protocol: Yes   Plan:  Coumadin 3.'75mg'$ *1 tonight Daily INR CBC at least every 72 hours  Melburn Popper, PharmD Clinical Pharmacy Resident Pager: [phone] 07/10/16 11:54 AM

## 2016-07-10 NOTE — Progress Notes (Signed)
Subjective: Patient was feeling better when seen this morning, denies any chest pain or shortness of breath. Still in A. fib/atrial flutter. Rate controlled.  Objective:  Vital signs in last 24 hours: Vitals:   07/10/16 0400 07/10/16 0500 07/10/16 0718 07/10/16 1134  BP: 94/79 (!) 85/66 94/70 (!) 85/75  Pulse:      Resp: (!) '22 18 16 '$ (!) 23  Temp: 98 F (36.7 C)  97.9 F (36.6 C) 97.6 F (36.4 C)  TempSrc: Oral  Oral Oral  SpO2: 92% 93% 95% 94%  Weight:      Height:       Telemetry review. Still in atrial flutter with rate control.  Gen. well-built, well-nourished man, sitting comfortably in his bed. Lungs. Mild scattered wheezing. CV. Irregular rhythm with normal rate, Abdomen. Soft, nontender, nondistended, bowel sounds positive. Extremities. Dry lower extremities, left upper extremity fistula in placed, no edema or cyanosis, pulses 1+ bilaterally.  Labs. CBC Latest Ref Rng & Units 07/10/2016 07/09/2016 03/27/2016  WBC 4.0 - 10.5 K/uL 7.4 7.5 6.4  Hemoglobin 13.0 - 17.0 g/dL 11.6(L) 12.4(L) 10.4(L)  Hematocrit 39.0 - 52.0 % 36.2(L) 37.7(L) 32.9(L)  Platelets 150 - 400 K/uL 152 150 153   BMP Latest Ref Rng & Units 07/10/2016 07/09/2016 03/27/2016  Glucose 65 - 99 mg/dL 82 81 87  BUN 6 - 20 mg/dL 28(H) 43(H) 31(H)  Creatinine 0.61 - 1.24 mg/dL 5.56(H) 7.22(H) 5.65(H)  Sodium 135 - 145 mmol/L 137 137 137  Potassium 3.5 - 5.1 mmol/L 4.1 4.8 4.6  Chloride 101 - 111 mmol/L 96(L) 98(L) 96(L)  CO2 22 - 32 mmol/L '28 26 31  '$ Calcium 8.9 - 10.3 mg/dL 7.6(L) 7.3(L) 9.2   Corrected calcium. 8.32   Assessment/Plan:  60 year old male with past medical history of end-stage renal disease, history of failed renal transplant, paroxysmal A. fib, diastolic CHF, History of aortic valve endocarditis, and coronary artery disease who presents from hemodialysis with tachycardia and hypotension.  A. fib/A flutter. His tachycardia with atrial flutter was the most probable cause of his  hypotension and shortness of breath. He does not appear volume overload, although his BNP was raised. He is still in A flutter/A. Fib, rate control on amiodarone drip. Cardiology is following, they plan to do cardioversion if remain in this rhythm until Monday.  ESRD. He has his dialysis yesterday, he was able to complete his session with the help of BiPAP for short interval and was given midodrine and albumin for hypertension. There was a net UF of 4500 with post dialysis weight was 80.2 KG, predialysis weight was 85.8 KG. -Continue Monday Wednesday Friday dialysis. -Continue phos. Binders. current phosphate was 5.8   Anemia. most probably due to chronic kidney disease. current hemoglobin is 11.6 . -Monitor hemoglobin.  Hypotension. his blood pressure is still on softer side. -Continue monitoring.  CHF. he has grade 2 diastolic dysfunction and ejection fraction of 50-55%. He became volume overload because of his previous to dialysis session were not completed because of tachycardia and hypotension. His shortness of breath was improved with that loss of fluid of about 4.5 L and weight was decreased more than 5 KG in one day.  CAD. s/p 2V CABG in 2014 (Y SVG to LAD & D2), NSTEMI 01/2015 s/p DES to LCx and BMS to SVG-LAD at [facility], STEMI 02/2016 s/p PTCA/DES to SVG-LAD extending in native vessel.  No acute coronary syndrome during this admission.   Dispo: Anticipated discharge in approximately 1-2 day(s).   [name]  Reesa Chew, MD 07/10/2016, 11:56 AM Pager: [phone]

## 2016-07-10 NOTE — Progress Notes (Signed)
[facility] Progress Note   Subjective: "I feel just fine today but I haven't walked or anything".  Denies chest pain/SOB. Still in Afib/Aflutter. Rate controlled.   Objective Vitals:   07/10/16 0300 07/10/16 0400 07/10/16 0500 07/10/16 0718  BP: 93/78 94/79 (!) 85/66 94/70  Pulse: 88     Resp: 19 (!) '22 18 16  '$ Temp: 98.2 F (36.8 C) 98 F (36.7 C)  97.9 F (36.6 C)  TempSrc: Oral Oral  Oral  SpO2: 95% 92% 93% 95%  Weight: 80.5 kg (177 lb 7.5 oz)     Height:       Physical Exam General: Pleasant, NAD Heart: S1, S2 2/6 systolic M. Afib/Aflutter on monitor.  Lungs: BBS CTA A/P Still sl dec in bases.  Abdomen: soft, nontender Extremities: No LE edema Dialysis Access: LFA AVF + bruit-aneurysmal.   Dialysis Orders: MWF [place] 4 hours 81 kg 1.0K/2.25 Ca 450/Auto 1.5  Linear Sodium UF profile 2 Heparin: 2000 units IV q Tx Mircera 50 mcg IV q 2 weeks (last HGB 12.2 07/07/16) Venofer: 100 mg IV X 3 doses (1/3 given) then 50 mg IV weekly. (Ferritin 584 Fe 61 Tsat 26 06/30/16)   Additional Objective Labs: Basic Metabolic Panel:  Recent Labs Lab 07/09/16 0759 07/10/16 0305  NA 137 137  K 4.8 4.1  CL 98* 96*  CO2 26 28  GLUCOSE 81 82  BUN 43* 28*  CREATININE 7.22* 5.56*  CALCIUM 7.3* 7.6*  PHOS  --  5.8*   Liver Function Tests:  Recent Labs Lab 07/09/16 0759 07/10/16 0305  AST 29  --   ALT 26  --   ALKPHOS 128*  --   BILITOT 0.5  --   PROT 7.5  --   ALBUMIN 3.5 3.1*   No results for input(s): LIPASE, AMYLASE in the last 168 hours. CBC:  Recent Labs Lab 07/09/16 0759 07/10/16 0305  WBC 7.5 7.4  NEUTROABS 6.0  --   HGB 12.4* 11.6*  HCT 37.7* 36.2*  MCV 101.9* 103.1*  PLT 150 152   Blood Culture    Component Value Date/Time   SDES BLOOD RIGHT ARM 12/05/2012 1408   SPECREQUEST BOTTLES DRAWN AEROBIC AND ANAEROBIC 10CC 12/05/2012 1408   CULT NO GROWTH 5 DAYS 12/05/2012 1408   REPTSTATUS 12/11/2012 FINAL 12/05/2012 1408     Cardiac Enzymes: No results for input(s): CKTOTAL, CKMB, CKMBINDEX, TROPONINI in the last 168 hours. CBG:  Recent Labs Lab 07/09/16 1108  GLUCAP 100*   Iron Studies: No results for input(s): IRON, TIBC, TRANSFERRIN, FERRITIN in the last 72 hours. '@lablastinr3'$ @ Studies/Results: Dg Chest Port 1 View  Result Date: 07/09/2016 CLINICAL DATA:  Dialysis dependent renal failure, transplant failure, recent episode of tachycardia, increased fatigue, history of coronary artery disease with stent placement EXAM: PORTABLE CHEST 1 VIEW COMPARISON:  PA and lateral chest x-ray of March 24, 2016 FINDINGS: The lungs are well-expanded. The interstitial markings are increased bilaterally and slightly more conspicuous than in the past. There is no alveolar infiltrate. A trace of pleural fluid blunts the right lateral costophrenic angle. There is no pneumothorax. The cardiac silhouette is enlarged. There are sternal wires present. The uppermost sternal wire is chronically broken. There is calcification in the wall of the aortic arch. IMPRESSION: Findings consistent with low-grade CHF superimposed upon COPD. No alveolar pneumonia. Aortic atherosclerosis. Electronically Signed   By: David  Martinique M.D.   On: 07/09/2016 08:32   Medications: . amiodarone 30 mg/hr (07/10/16 0123)   .  atorvastatin  40 mg Oral q1800  . calcium acetate  4,002 mg Oral TID WC  . citalopram  20 mg Oral QHS  . clopidogrel  75 mg Oral Daily  . docusate sodium  300 mg Oral BID  . [START ON 07/12/2016] ferric gluconate (FERRLECIT/NULECIT) IV  125 mg Intravenous Q M,W,F-HD  . folic acid  722 mcg Oral Daily  . gabapentin  300 mg Oral QHS  . metoprolol tartrate  12.5 mg Oral BID  . Warfarin - Pharmacist Dosing Inpatient   Does not apply q1800     Assessment/Plan: 1. CHF exacerbation. H/O grade 2 diastolic dysfunction. EF 50-55%.  Urgent HD for volume removal yesterday. Required BIPAP for short interval now on [place]. Much improved today.    2. Afib/Aflutter RVR: Cardiology following. Started on amiodarone. Possible DC cardioversion Monday if still in [place].  2. ESRD -MWF [place]. Next HD Monday.  3. Anemia - HGB 11.6 No ESA. Follow HGB.  4. Secondary hyperparathyroidism - Ca 7.6 C Ca 8.32 (S/P parathyroidectomy) Phos 5.8. Continue binders.  5. HTN/volume -BP still soft today. Cont. Metoprolol. HD yesterday-day issues with hypotension-required midodrine and albumin to complete tx. Pre wt 85.8 kg Net UF 4500 Post wt 80.2 kg. Will need lower EDW on DC. Cautioned fld restrictions as he historically has high IDWG.  6. Nutrition - Albumin 3.1. Start renal vit/prostat. Renal diet with fld restrictions. Spoke with nursing staff to enforce fld restrictions.   Rita H. Brown NP-C 07/10/2016, 10:29 AM  [facility] [phone]  Pt seen, examined and agree w A/P as above.  Kelly Splinter MD [employer] pager [phone]   07/10/2016, 11:37 AM

## 2016-07-10 NOTE — Progress Notes (Signed)
ANTICOAGULATION CONSULT NOTE - Follow-up Consult  Pharmacy Consult for Coumadin Indication: atrial fibrillation  Patient Measurements: Height: '5\' 10"'$  (177.8 cm) Weight: 177 lb 7.5 oz (80.5 kg) IBW/kg (Calculated) : 73  Vital Signs: Temp: 97.6 F (36.4 C) (11/04 1134) Temp Source: Oral (11/04 1134) BP: 85/75 (11/04 1134) Pulse Rate: 88 (11/04 0300)  Labs:  Recent Labs  07/09/16 0759 07/10/16 0305  HGB 12.4* 11.6*  HCT 37.7* 36.2*  PLT 150 152  LABPROT 28.6* 29.8*  INR 2.63 2.76  CREATININE 7.22* 5.56*    Estimated Creatinine Clearance: 14.4 mL/min (by C-G formula based on SCr of 5.56 mg/dL (H)).  Assessment: 54 yoM w/ESRD-HD MWF and Hx of afib on Coumadin. Remains therapeutic on home dose (2.76). Of note, patient now on amio drip as of 11/4. CBC wnl.  PTA: 7.'5mg'$  daily except 3.'75mg'$  on Tues/Sat - last taken 11/2  Goal of Therapy:  INR 2-3 Monitor platelets by anticoagulation protocol: Yes   Plan:  Coumadin 3.'75mg'$ *1 tonight Daily INR CBC at least every 72 hours  Melburn Popper, PharmD Clinical Pharmacy Resident Pager: [phone] 07/10/16 11:57 AM

## 2016-07-11 ENCOUNTER — Ambulatory Visit (HOSPITAL_COMMUNITY)
Admit: 2016-07-11 | Discharge: 2016-07-11 | Disposition: A | Discharge status: Home / Self Care | Payer: Medicare Other | Attending: Family Medicine | Admitting: Family Medicine

## 2016-07-11 LAB — BASIC METABOLIC PANEL
Anion gap: 14 (ref 5–15)
BUN: 46 mg/dL — AB (ref 6–20)
CALCIUM: 8.4 mg/dL — AB (ref 8.9–10.3)
CO2: 28 mmol/L (ref 22–32)
CREATININE: 7.64 mg/dL — AB (ref 0.61–1.24)
Chloride: 95 mmol/L — ABNORMAL LOW (ref 101–111)
GFR calc Af Amer: 8 mL/min — ABNORMAL LOW (ref >60)
GFR calc non Af Amer: 7 mL/min — ABNORMAL LOW (ref >60)
GLUCOSE: 101 mg/dL — AB (ref 65–99)
Potassium: 4.4 mmol/L (ref 3.5–5.1)
Sodium: 137 mmol/L (ref 135–145)

## 2016-07-11 LAB — CBC
HEMATOCRIT: 37.5 % — AB (ref 39.0–52.0)
Hemoglobin: 12 g/dL — ABNORMAL LOW (ref 13.0–17.0)
MCH: 32.7 pg (ref 26.0–34.0)
MCHC: 32 g/dL (ref 30.0–36.0)
MCV: 102.2 fL — AB (ref 78.0–100.0)
Platelets: 161 10*3/uL (ref 150–400)
RBC: 3.67 MIL/uL — ABNORMAL LOW (ref 4.22–5.81)
RDW: 15.7 % — AB (ref 11.5–15.5)
WBC: 7.8 10*3/uL (ref 4.0–10.5)

## 2016-07-11 LAB — MAGNESIUM: MAGNESIUM: 2.2 mg/dL (ref 1.7–2.4)

## 2016-07-11 LAB — PROTIME-INR
INR: 2.63
Prothrombin Time: 28.6 seconds — ABNORMAL HIGH (ref 11.4–15.2)

## 2016-07-11 MED ORDER — SODIUM CHLORIDE 0.9% FLUSH: 3.0000 mL | INTRAVENOUS | Status: DC | PRN

## 2016-07-11 MED ORDER — SODIUM CHLORIDE 0.9% FLUSH
3.0000 mL | Freq: Two times a day (BID) | INTRAVENOUS | Status: DC
  Administered 2016-07-11 – 2016-07-13 (×4): 3 mL via INTRAVENOUS

## 2016-07-11 MED ORDER — WARFARIN SODIUM 7.5 MG PO TABS
7.5000 mg | ORAL_TABLET | Freq: Once | ORAL | Status: AC
  Administered 2016-07-11: 7.5 mg via ORAL
  Filled 2016-07-11: qty 1

## 2016-07-11 MED ORDER — HYDROCORTISONE 1 % EX CREA
1.0000 "application " | TOPICAL_CREAM | Freq: Three times a day (TID) | CUTANEOUS | Status: DC | PRN
  Filled 2016-07-11: qty 28

## 2016-07-11 MED ORDER — PRO-STAT SUGAR FREE PO LIQD
30.0000 mL | Freq: Two times a day (BID) | ORAL | Status: DC
  Administered 2016-07-11 – 2016-07-13 (×3): 30 mL via ORAL
  Filled 2016-07-11 (×3): qty 30

## 2016-07-11 MED ORDER — SODIUM CHLORIDE 0.9 % IV SOLN
250.0000 mL | INTRAVENOUS | Status: DC
  Administered 2016-07-12: 11:00:00 via INTRAVENOUS

## 2016-07-11 MED ORDER — MIDODRINE HCL 5 MG PO TABS: 10.0000 mg | ORAL_TABLET | ORAL | Status: DC

## 2016-07-11 NOTE — Progress Notes (Signed)
Patient Name: [name] Date of Encounter: 07/11/2016  Hospital Problem List     Principal Problem:   CHF (congestive heart failure) ([place]) Active Problems:   Hypotension   Renal transplant failure and rejection   PAF (paroxysmal atrial fibrillation) ([place])   ESRD on hemodialysis   SVT (supraventricular tachycardia) ([facility])   Hx of CABG x 2 2014   CAD (coronary artery disease)   Acute CHF [facility])    Patient Profile     Kristian Mogg is a 60 y.o. male with an extensive hx of CAD s/p 2V CABG in 2014 (Y SVG to LAD & D2), NSTEMI 01/2015 s/p DES to LCx and BMS to SVG-LAD at [facility], STEMI 02/2016 s/p PTCA/DES to SVG-LAD extending in native vessel. He has ESRD on HD, atrial fib, bioprosthetic AVR, moderate MR.  He was admitted with increased SOB.   Subjective   He feels OK .  No acute distress.  No SOB or pain.   Inpatient Medications    . atorvastatin  40 mg Oral q1800  . calcium acetate  4,002 mg Oral TID WC  . citalopram  20 mg Oral QHS  . clopidogrel  75 mg Oral Daily  . docusate sodium  300 mg Oral BID  . [START ON 07/12/2016] ferric gluconate (FERRLECIT/NULECIT) IV  125 mg Intravenous Q M,W,F-HD  . folic acid  244 mcg Oral Daily  . gabapentin  300 mg Oral QHS  . metoprolol tartrate  12.5 mg Oral BID  . Warfarin - Pharmacist Dosing Inpatient   Does not apply q1800    Vital Signs    Vitals:   07/11/16 0000 07/11/16 0418 07/11/16 0600 07/11/16 0808  BP: 100/86 95/75  97/85  Pulse:    (!) 119  Resp: 20 (!) 22  17  Temp: 97.8 F (36.6 C) 97.6 F (36.4 C)  97.3 F (36.3 C)  TempSrc: Oral Oral  Oral  SpO2: 93% 93%  99%  Weight:   181 lb 10.5 oz (82.4 kg)   Height:        Intake/Output Summary (Last 24 hours) at 07/11/16 0852 Last data filed at 07/11/16 0800  Gross per 24 hour  Intake           1467.2 ml  Output                0 ml  Net           1467.2 ml   Filed Weights   07/09/16 1703 07/10/16 0300 07/11/16 0600  Weight: 176 lb 12.9 oz (80.2 kg) 177 lb 7.5 oz  (80.5 kg) 181 lb 10.5 oz (82.4 kg)    Physical Exam    GEN: Well nourished, well developed, in  no acute distress.  Neck: Supple, no JVD, carotid bruits, or masses. Cardiac: Irregular, no rubs, or gallops. No clubbing, cyanosis, no edema.  Radials/DP/PT 2+ and equal bilaterally.  Respiratory:  Respirations  regular and unlabored, clear to auscultation bilaterally. GI: Soft, nontender, nondistended, BS + x 4. Neuro:  Strength and sensation are intact.   Labs    CBC  Recent Labs  07/09/16 0759 07/10/16 0305 07/11/16 0251  WBC 7.5 7.4 7.8  NEUTROABS 6.0  --   --   HGB 12.4* 11.6* 12.0*  HCT 37.7* 36.2* 37.5*  MCV 101.9* 103.1* 102.2*  PLT 150 152 010   Basic Metabolic Panel  Recent Labs  07/10/16 0305 07/11/16 0251  NA 137 137  K 4.1 4.4  CL 96*  95*  CO2 28 28  GLUCOSE 82 101*  BUN 28* 46*  CREATININE 5.56* 7.64*  CALCIUM 7.6* 8.4*  MG  --  2.2  PHOS 5.8*  --    Liver Function Tests  Recent Labs  07/09/16 0759 07/10/16 0305  AST 29  --   ALT 26  --   ALKPHOS 128*  --   BILITOT 0.5  --   PROT 7.5  --   ALBUMIN 3.5 3.1*   No results for input(s): LIPASE, AMYLASE in the last 72 hours. Cardiac Enzymes No results for input(s): CKTOTAL, CKMB, CKMBINDEX, TROPONINI in the last 72 hours. BNP Invalid input(s): POCBNP D-Dimer No results for input(s): DDIMER in the last 72 hours. Hemoglobin A1C No results for input(s): HGBA1C in the last 72 hours. Fasting Lipid Panel No results for input(s): CHOL, HDL, LDLCALC, TRIG, CHOLHDL, LDLDIRECT in the last 72 hours. Thyroid Function Tests No results for input(s): TSH, T4TOTAL, T3FREE, THYROIDAB in the last 72 hours.  Invalid input(s): FREET3  Telemetry    Atrial fib with RVR  ECG    NA  Radiology    Dg Chest Port 1 View  Result Date: 07/09/2016 CLINICAL DATA:  Dialysis dependent renal failure, transplant failure, recent episode of tachycardia, increased fatigue, history of coronary artery disease with  stent placement EXAM: PORTABLE CHEST 1 VIEW COMPARISON:  PA and lateral chest x-ray of March 24, 2016 FINDINGS: The lungs are well-expanded. The interstitial markings are increased bilaterally and slightly more conspicuous than in the past. There is no alveolar infiltrate. A trace of pleural fluid blunts the right lateral costophrenic angle. There is no pneumothorax. The cardiac silhouette is enlarged. There are sternal wires present. The uppermost sternal wire is chronically broken. There is calcification in the wall of the aortic arch. IMPRESSION: Findings consistent with low-grade CHF superimposed upon COPD. No alveolar pneumonia. Aortic atherosclerosis. Electronically Signed   By: David  Martinique M.D.   On: 07/09/2016 08:32    Assessment & Plan    ATRIAL FIB:  RVR still on IV amiodarone.  Keep NPO after MN and plan DCCV in the AM.   CAD:  No evidence of ischemia.  Continue current therapy.  ACUTE ON CHRONIC DIASTOLIC HF:  Volume management per HD.   AVR:  Echo was OK in June. Continue to follow.      Signed, Minus Breeding, MD  07/11/2016, 8:52 AM

## 2016-07-11 NOTE — Progress Notes (Signed)
[facility] Progress Note   Subjective: "I'm doing OK". Sitting up at bedside with wife getting bath. Denies Chest pain/SOB at present. Cont. In Afib/Aflutter rate 120s.    Objective Vitals:   07/11/16 0000 07/11/16 0418 07/11/16 0600 07/11/16 0808  BP: 100/86 95/75  97/85  Pulse:    (!) 119  Resp: 20 (!) 22  17  Temp: 97.8 F (36.6 C) 97.6 F (36.4 C)  97.3 F (36.3 C)  TempSrc: Oral Oral  Oral  SpO2: 93% 93%  99%  Weight:   82.4 kg (181 lb 10.5 oz)   Height:       Physical Exam General: Pleasant, NAD Heart: irregular, S1, S2 2/6 systolic M. Afib/Aflutter on monitor.  Lungs: BBS CTA A/P Still sl dec in bases R > L.  Abdomen: soft, nontender Extremities: No LE edema Dialysis Access: LFA AVF + bruit-aneurysmal.   Dialysis Orders: MWF [place] 4 hours 81 kg 1.0K/2.25 Ca 450/Auto 1.5  Linear Sodium UF profile 2 Heparin: 2000 units IV q Tx Mircera 50 mcg IV q 2 weeks (last HGB 12.2 07/07/16) Venofer: 100 mg IV X 3 doses (1/3 given) then 50 mg IV weekly. (Ferritin 584 Fe 61 Tsat 26 06/30/16)  Additional Objective Labs: Basic Metabolic Panel:  Recent Labs Lab 07/09/16 0759 07/10/16 0305 07/11/16 0251  NA 137 137 137  K 4.8 4.1 4.4  CL 98* 96* 95*  CO2 '26 28 28  '$ GLUCOSE 81 82 101*  BUN 43* 28* 46*  CREATININE 7.22* 5.56* 7.64*  CALCIUM 7.3* 7.6* 8.4*  PHOS  --  5.8*  --    Liver Function Tests:  Recent Labs Lab 07/09/16 0759 07/10/16 0305  AST 29  --   ALT 26  --   ALKPHOS 128*  --   BILITOT 0.5  --   PROT 7.5  --   ALBUMIN 3.5 3.1*   No results for input(s): LIPASE, AMYLASE in the last 168 hours. CBC:  Recent Labs Lab 07/09/16 0759 07/10/16 0305 07/11/16 0251  WBC 7.5 7.4 7.8  NEUTROABS 6.0  --   --   HGB 12.4* 11.6* 12.0*  HCT 37.7* 36.2* 37.5*  MCV 101.9* 103.1* 102.2*  PLT 150 152 161   Blood Culture    Component Value Date/Time   SDES BLOOD RIGHT WRIST 07/09/2016 1800   SPECREQUEST BOTTLES DRAWN AEROBIC AND  ANAEROBIC 5CC 07/09/2016 1800   CULT NO GROWTH 2 DAYS 07/09/2016 1800   REPTSTATUS PENDING 07/09/2016 1800    Cardiac Enzymes: No results for input(s): CKTOTAL, CKMB, CKMBINDEX, TROPONINI in the last 168 hours. CBG:  Recent Labs Lab 07/09/16 1108  GLUCAP 100*   Iron Studies: No results for input(s): IRON, TIBC, TRANSFERRIN, FERRITIN in the last 72 hours. '@lablastinr3'$ @ Studies/Results: No results found. Medications: . sodium chloride    . amiodarone 30 mg/hr (07/11/16 0842)   . atorvastatin  40 mg Oral q1800  . calcium acetate  4,002 mg Oral TID WC  . citalopram  20 mg Oral QHS  . clopidogrel  75 mg Oral Daily  . docusate sodium  300 mg Oral BID  . [START ON 07/12/2016] ferric gluconate (FERRLECIT/NULECIT) IV  125 mg Intravenous Q M,W,F-HD  . folic acid  824 mcg Oral Daily  . gabapentin  300 mg Oral QHS  . metoprolol tartrate  12.5 mg Oral BID  . sodium chloride flush  3 mL Intravenous Q12H  . warfarin  7.5 mg Oral ONCE-1800  . Warfarin - Pharmacist Dosing Inpatient  Does not apply q1800     Assessment/Plan: 1. CHF exacerbation.H/O grade 2 diastolic dysfunction. EF 50-55%.  Urgent HD for volume removal 07/10/16. Required BIPAP for short interval. Currently stable.  2. Afib/Aflutter RVR: Cardiology following. Started on amiodarone. Plan DC cardioversion Monday.  2. ESRD -MWF [place]. Next HD Tomorrow. K+ 4.4.  3. Anemia - HGB 12.0 No ESA. Follow HGB.  4. Secondary hyperparathyroidism - Ca 7.6 C Ca 8.32 (S/P parathyroidectomy) Phos 5.8. Continue binders.  5. HTN/volume -BP still soft today. Cont. Metoprolol. Having issues with hypotension since he has been in Afib/Aflutter. Will use midodrine short term for BP support MWF with HD until cardiac rhythm stable. DC midodrine if he converts to SR.  6. Nutrition - Albumin 3.1. Start renal vit/prostat. Renal diet with fld restrictions. Spoke with nursing staff to enforce fld restrictions.   Rita H. Brown NP-C 07/11/2016, 11:11  AM  [facility] [phone]  Pt seen, examined and agree w A/P as above. HD tomorrow after his cardioversion procedure.  Kelly Splinter MD [employer] pager [phone]   07/11/2016, 12:29 PM

## 2016-07-11 NOTE — Progress Notes (Signed)
ANTICOAGULATION CONSULT NOTE - Follow-up Consult  Pharmacy Consult for Coumadin Indication: atrial fibrillation  Patient Measurements: Height: '5\' 10"'$  (177.8 cm) Weight: 181 lb 10.5 oz (82.4 kg) IBW/kg (Calculated) : 73  Vital Signs: Temp: 97.3 F (36.3 C) (11/05 0808) Temp Source: Oral (11/05 0808) BP: 97/85 (11/05 0808) Pulse Rate: 119 (11/05 0808)  Labs:  Recent Labs  07/09/16 0759 07/10/16 0305 07/11/16 0251  HGB 12.4* 11.6* 12.0*  HCT 37.7* 36.2* 37.5*  PLT 150 152 161  LABPROT 28.6* 29.8* 28.6*  INR 2.63 2.76 2.63  CREATININE 7.22* 5.56* 7.64*    Estimated Creatinine Clearance: 10.5 mL/min (by C-G formula based on SCr of 7.64 mg/dL (H)).  Assessment: 62 yoM w/ESRD-HD MWF and Hx of afib on Coumadin. Remains therapeutic on home dose (2.6). Of note, patient now on amio drip as of 11/4. CBC stable.  PTA: 7.'5mg'$  daily except 3.'75mg'$  on Tues/Sat - last taken 11/2  Goal of Therapy:  INR 2-3 Monitor platelets by anticoagulation protocol: Yes   Plan:  Coumadin 7.'5mg'$ *1 tonight Daily INR CBC at least every 72 hours  Melburn Popper, PharmD Clinical Pharmacy Resident Pager: [phone] 07/11/16 9:57 AM

## 2016-07-11 NOTE — Progress Notes (Signed)
Medicine attending: We appreciate ongoing assistance from cardiology. Clinical status and database reviewed with resident physician Dr. Vernelle Emerald and I concur with his evaluation and management plan. Patient remains in atrial fibrillation/ flutter on amiodarone infusion. DC cardioversion planned for tomorrow morning. INR therapeutic on current dose of Coumadin at 2.6.

## 2016-07-11 NOTE — Progress Notes (Addendum)
Subjective: He feels his breathing is not the best but is in no distress. Pleasant demeanorHis tachycardia persists with aflutter that is rate controlled below 120s on medication.  Objective:  Vital signs in last 24 hours: Vitals:   07/11/16 0000 07/11/16 0418 07/11/16 0600 07/11/16 0808  BP: 100/86 95/75  97/85  Pulse:    (!) 119  Resp: 20 (!) 22  17  Temp: 97.8 F (36.6 C) 97.6 F (36.4 C)  97.3 F (36.3 C)  TempSrc: Oral Oral  Oral  SpO2: 93% 93%  99%  Weight:   181 lb 10.5 oz (82.4 kg)   Height:       Telemetry review. Still in afib/atrial flutter with rate control.  GENERAL- alert, co-operative, NAD HEENT- Atraumatic, PERRL, EOMI, oral mucosa appears moist, good and intact dentition, no carotid bruit, no cervical LN enlargement. CARDIAC- Irregular, no murmurs, rubs or gallops. RESP- Bilateral end expiratory wheezes ABDOMEN- Soft, nontender EXTREMITIES- LUE AVF w/ palpable thrill,  no pedal edema, chronic stasis skin changes on b/l legs SKIN- Warm, dry, No rash or lesion. PSYCH- Normal mood and affect, appropriate thought content and speech.   Labs. CBC Latest Ref Rng & Units 07/11/2016 07/10/2016 07/09/2016  WBC 4.0 - 10.5 K/uL 7.8 7.4 7.5  Hemoglobin 13.0 - 17.0 g/dL 12.0(L) 11.6(L) 12.4(L)  Hematocrit 39.0 - 52.0 % 37.5(L) 36.2(L) 37.7(L)  Platelets 150 - 400 K/uL 161 152 150   BMP Latest Ref Rng & Units 07/11/2016 07/10/2016 07/09/2016  Glucose 65 - 99 mg/dL 101(H) 82 81  BUN 6 - 20 mg/dL 46(H) 28(H) 43(H)  Creatinine 0.61 - 1.24 mg/dL 7.64(H) 5.56(H) 7.22(H)  Sodium 135 - 145 mmol/L 137 137 137  Potassium 3.5 - 5.1 mmol/L 4.4 4.1 4.8  Chloride 101 - 111 mmol/L 95(L) 96(L) 98(L)  CO2 22 - 32 mmol/L '28 28 26  '$ Calcium 8.9 - 10.3 mg/dL 8.4(L) 7.6(L) 7.3(L)    Assessment/Plan:  60 year old male with past medical history of end-stage renal disease, history of failed renal transplant, paroxysmal A. fib, diastolic CHF, History of aortic valve endocarditis s/p  bioprosthetic replacement, and coronary artery disease who presents from hemodialysis with tachycardia and hypotension.  Atrial flutter: He continues to be tachycardic with a variable 2 or 3 or 4-to-1 block. This is controlled to a rate of 120s on continued amiodarone infusion but has not converted to a normal sinus rhythm. He is likely to remain intolerant of dialysis or exertion while in this rhythm due to chronic diastolic dysfunction. Cardiology recommendations are appreciated and following, possibly to pursue DCCV tomorrow if this remains persistent. -Already chronically anticoagulated for bioprosthetic aortic valve, coumadin -metoprolol at reduced dose -Continue amiodarine gtt  ESRD: Normally on MWF dialysis. He completed full session with net -4566m on Friday -Continue Monday Wednesday Friday HD -Hyperphosphatemia, MBD management per nephrology recs  Hypotension: SBPs remain low, likely due to decreased output from his ongoing arrhythmia.  HFpEF: Moderate volume overload on presentation which improved after hemodialysis. Known grade 2 diastolic dysfunction with 55% LVEF. Clinically he does not appear grossly volume overloaded at this time.  CAD: s/p 2V CABG in 2014 (Y SVG to LAD & D2), NSTEMI 01/2015 s/p DES to LCx and BMS to SVG-LAD at [facility] STEMI 02/2016 s/p PTCA/DES to SVG-LAD extending in native vessel. Not in acute coronary syndrome.  Anemia: Chronic and near baseline value  Dispo: Anticipated discharge in approximately 1-2 day(s).   CCollier Salina MD PGY-II Internal Medicine Resident Pager# [phone]/01/2016, 9:01  AM

## 2016-07-12 ENCOUNTER — Inpatient Hospital Stay (HOSPITAL_COMMUNITY): Payer: Medicare Other | Admitting: Anesthesiology

## 2016-07-12 ENCOUNTER — Encounter (HOSPITAL_COMMUNITY)
Admission: EM | Disposition: A | Discharge status: Home / Self Care | Payer: Self-pay | Source: Home / Self Care | Attending: Oncology

## 2016-07-12 ENCOUNTER — Encounter (HOSPITAL_COMMUNITY): Payer: Self-pay | Admitting: Anesthesiology

## 2016-07-12 DIAGNOSIS — Y849 Medical procedure, unspecified as the cause of abnormal reaction of the patient, or of later complication, without mention of misadventure at the time of the procedure: Secondary | ICD-10-CM

## 2016-07-12 DIAGNOSIS — E782 Mixed hyperlipidemia: Secondary | ICD-10-CM | POA: Insufficient documentation

## 2016-07-12 DIAGNOSIS — I4892 Unspecified atrial flutter: Secondary | ICD-10-CM

## 2016-07-12 DIAGNOSIS — D649 Anemia, unspecified: Secondary | ICD-10-CM

## 2016-07-12 DIAGNOSIS — Z992 Dependence on renal dialysis: Secondary | ICD-10-CM

## 2016-07-12 DIAGNOSIS — Z955 Presence of coronary angioplasty implant and graft: Secondary | ICD-10-CM

## 2016-07-12 DIAGNOSIS — Z952 Presence of prosthetic heart valve: Secondary | ICD-10-CM

## 2016-07-12 DIAGNOSIS — I5032 Chronic diastolic (congestive) heart failure: Secondary | ICD-10-CM

## 2016-07-12 DIAGNOSIS — I959 Hypotension, unspecified: Secondary | ICD-10-CM

## 2016-07-12 DIAGNOSIS — I252 Old myocardial infarction: Secondary | ICD-10-CM

## 2016-07-12 DIAGNOSIS — G2581 Restless legs syndrome: Secondary | ICD-10-CM | POA: Insufficient documentation

## 2016-07-12 DIAGNOSIS — T8612 Kidney transplant failure: Secondary | ICD-10-CM

## 2016-07-12 DIAGNOSIS — N186 End stage renal disease: Secondary | ICD-10-CM

## 2016-07-12 HISTORY — PX: CARDIOVERSION: SHX1299

## 2016-07-12 LAB — RENAL FUNCTION PANEL
Albumin: 3.1 g/dL — ABNORMAL LOW (ref 3.5–5.0)
Anion gap: 17 — ABNORMAL HIGH (ref 5–15)
BUN: 66 mg/dL — AB (ref 6–20)
CALCIUM: 8.5 mg/dL — AB (ref 8.9–10.3)
CO2: 27 mmol/L (ref 22–32)
CREATININE: 9.26 mg/dL — AB (ref 0.61–1.24)
Chloride: 92 mmol/L — ABNORMAL LOW (ref 101–111)
GFR, EST AFRICAN AMERICAN: 6 mL/min — AB (ref >60)
GFR, EST NON AFRICAN AMERICAN: 5 mL/min — AB (ref >60)
Glucose, Bld: 109 mg/dL — ABNORMAL HIGH (ref 65–99)
Phosphorus: 7.4 mg/dL — ABNORMAL HIGH (ref 2.5–4.6)
Potassium: 5 mmol/L (ref 3.5–5.1)
SODIUM: 136 mmol/L (ref 135–145)

## 2016-07-12 LAB — CBC
HCT: 35.1 % — ABNORMAL LOW (ref 39.0–52.0)
Hemoglobin: 11.2 g/dL — ABNORMAL LOW (ref 13.0–17.0)
MCH: 32.6 pg (ref 26.0–34.0)
MCHC: 31.9 g/dL (ref 30.0–36.0)
MCV: 102 fL — AB (ref 78.0–100.0)
PLATELETS: 147 10*3/uL — AB (ref 150–400)
RBC: 3.44 MIL/uL — AB (ref 4.22–5.81)
RDW: 15.7 % — AB (ref 11.5–15.5)
WBC: 7 10*3/uL (ref 4.0–10.5)

## 2016-07-12 LAB — PROTIME-INR
INR: 2.38
PROTHROMBIN TIME: 26.4 s — AB (ref 11.4–15.2)

## 2016-07-12 SURGERY — CARDIOVERSION
Anesthesia: General

## 2016-07-12 MED ORDER — PHENYLEPHRINE HCL 10 MG/ML IJ SOLN
INTRAMUSCULAR | Status: DC | PRN
  Administered 2016-07-12: 80 ug via INTRAVENOUS

## 2016-07-12 MED ORDER — EPHEDRINE SULFATE 50 MG/ML IJ SOLN
INTRAMUSCULAR | Status: DC | PRN
  Administered 2016-07-12: 10 mg via INTRAVENOUS

## 2016-07-12 MED ORDER — LIDOCAINE HCL (CARDIAC) 20 MG/ML IV SOLN
INTRAVENOUS | Status: DC | PRN
  Administered 2016-07-12: 60 mg via INTRAVENOUS

## 2016-07-12 MED ORDER — AMIODARONE HCL 200 MG PO TABS
400.0000 mg | ORAL_TABLET | Freq: Two times a day (BID) | ORAL | Status: DC
  Administered 2016-07-12 – 2016-07-13 (×3): 400 mg via ORAL
  Filled 2016-07-12 (×3): qty 2

## 2016-07-12 MED ORDER — RAMELTEON 8 MG PO TABS
8.0000 mg | ORAL_TABLET | Freq: Every day | ORAL | Status: DC
  Administered 2016-07-12 (×2): 8 mg via ORAL
  Filled 2016-07-12 (×3): qty 1

## 2016-07-12 MED ORDER — WARFARIN SODIUM 7.5 MG PO TABS
7.5000 mg | ORAL_TABLET | Freq: Once | ORAL | Status: AC
  Administered 2016-07-12: 7.5 mg via ORAL
  Filled 2016-07-12: qty 1

## 2016-07-12 MED ORDER — PROPOFOL 10 MG/ML IV BOLUS
INTRAVENOUS | Status: DC | PRN
  Administered 2016-07-12: 50 mg via INTRAVENOUS

## 2016-07-12 NOTE — Addendum Note (Signed)
Addendum  created 07/12/16 1255 by Jenne Campus, CRNA   Sign clinical note

## 2016-07-12 NOTE — Anesthesia Postprocedure Evaluation (Signed)
Anesthesia Post Note  Patient: [name]  Procedure(s) Performed: Procedure(s) (LRB): CARDIOVERSION (N/A)  Patient location during evaluation: PACU Anesthesia Type: General Level of consciousness: awake and alert Pain management: pain level controlled Vital Signs Assessment: post-procedure vital signs reviewed and stable Respiratory status: spontaneous breathing, nonlabored ventilation, respiratory function stable and patient connected to nasal cannula oxygen Cardiovascular status: blood pressure returned to baseline and stable Postop Assessment: no signs of nausea or vomiting Anesthetic complications: no    Last Vitals:  Vitals:   07/12/16 1230 07/12/16 1235  BP: (!) 86/56 (!) 80/56  Pulse: (!) 56 (!) 54  Resp: 18 (!) 22  Temp:      Last Pain:  Vitals:   07/12/16 1224  TempSrc: Oral  PainSc:                  Yvone Slape,W. EDMOND

## 2016-07-12 NOTE — Transfer of Care (Addendum)
Immediate Anesthesia Transfer of Care Note  Patient: [name]  Procedure(s) Performed: Procedure(s): CARDIOVERSION (N/A)  Patient Location: Endoscopy Unit  Anesthesia Type:General  Level of Consciousness: awake, alert , oriented and patient cooperative  Airway & Oxygen Therapy: Patient Spontanous Breathing and Patient connected to nasal cannula oxygen  Post-op Assessment: Report given to RN and Post -op Vital signs reviewed and stable  Post vital signs: Reviewed  Last Vitals:  Vitals:   07/12/16 1116 07/12/16 1224  BP: (!) 88/68 (!) 96/57  Pulse: 78 (!) 57  Resp:  18  Temp: 36.4 C     Last Pain:  Vitals:   07/12/16 1116  TempSrc: Oral  PainSc:          Complications: No apparent anesthesia complications

## 2016-07-12 NOTE — Progress Notes (Signed)
   Subjective: Patient was feeling better when seen this morning, denies any chest pain, palpitations or shortness of breath.  Objective:  Vital signs in last 24 hours: Vitals:   07/12/16 0500 07/12/16 0600 07/12/16 0753 07/12/16 0754  BP: (!) 87/62 (!) 89/70  (!) 89/67  Pulse:   92   Resp: '20 18  19  '$ Temp:    97.2 F (36.2 C)  TempSrc:    Oral  SpO2: 92% 94%  91%  Weight:      Height:       Telemetry review. Atrial flutter/fibrillation with ventricular rate within normal limits.  Gen. well-built, well-developed man, alert and oriented, in no acute distress. Lungs. Bilateral basal crackles with few scattered expiratory wheezes. CV. If regular rhythm with  Rate within normal limit. Abdomen. Soft, nontender, bowel sounds positive Extremities. 1+ pedal edema, dry skin with chronic venous stasis changes bilaterally, left upper extremity forearm fistula with palpable thrill.  Labs. CBC Latest Ref Rng & Units 07/12/2016 07/11/2016 07/10/2016  WBC 4.0 - 10.5 K/uL 7.0 7.8 7.4  Hemoglobin 13.0 - 17.0 g/dL 11.2(L) 12.0(L) 11.6(L)  Hematocrit 39.0 - 52.0 % 35.1(L) 37.5(L) 36.2(L)  Platelets 150 - 400 K/uL 147(L) 161 152   BMP Latest Ref Rng & Units 07/12/2016 07/11/2016 07/10/2016  Glucose 65 - 99 mg/dL 109(H) 101(H) 82  BUN 6 - 20 mg/dL 66(H) 46(H) 28(H)  Creatinine 0.61 - 1.24 mg/dL 9.26(H) 7.64(H) 5.56(H)  Sodium 135 - 145 mmol/L 136 137 137  Potassium 3.5 - 5.1 mmol/L 5.0 4.4 4.1  Chloride 101 - 111 mmol/L 92(L) 95(L) 96(L)  CO2 22 - 32 mmol/L '27 28 28  '$ Calcium 8.9 - 10.3 mg/dL 8.5(L) 8.4(L) 7.6(L)    Assessment/Plan:  60 year old male withpast medical history of end-stage renal disease, history of failed renal transplant, paroxysmal A. fib, diastolic CHF, History of aortic valve endocarditis s/p bioprosthetic replacement, and coronary artery disease who presents from hemodialysis with tachycardia and hypotension.  Atrial flutter/fibrillation. He is still having atrial  flutter/fibrillation with ventricular rate within normal limit. Amiodarone infusion was converted to by mouth  400 mg twice daily by cardiology today. -Plan is to cardiovert him today. -We will follow cardiology recommendations. -Continue Coumadin -Continue metoprolol at 12.5 mg twice daily.  ESRD. For dialysis today after cardioversion. If his pressure remains on softer side will need help with BP support. -Nephrology is following.  Hypotension: SBPs remain low, likely due to decreased output from his ongoing arrhythmia. Hopefully it will improve after cardioversion.  HFpEF: Known grade 2 diastolic dysfunction with 55% LVEF. He was somewhat volume overload today. Bilateral basal crackles and 1+ pedal edema, due for dialysis today.  CAD: s/p 2V CABG in 2014 (Y SVG to LAD & D2), NSTEMI 01/2015 s/p DES to LCx and BMS to SVG-LAD at [facility], STEMI 02/2016 s/p PTCA/DES to SVG-LAD extending in native vessel. Not in acute coronary syndrome.  Anemia: Chronic and near baseline value.  Dispo: Anticipated discharge in approximately 1 day(s), if remains stable after cardioversion and dialysis.   [name], MD 07/12/2016, 10:43 AM Pager: [phone]

## 2016-07-12 NOTE — Anesthesia Preprocedure Evaluation (Addendum)
Anesthesia Evaluation  Patient identified by MRN, date of birth, ID band Patient awake    Reviewed: Allergy & Precautions, H&P , NPO status , Patient's Chart, lab work & pertinent test results, reviewed documented beta blocker date and time   Airway Mallampati: II   Neck ROM: Full    Dental no notable dental hx. (+) Teeth Intact, Poor Dentition, Dental Advisory Given   Pulmonary sleep apnea , COPD,  COPD inhaler, former smoker,    Pulmonary exam normal breath sounds clear to auscultation       Cardiovascular hypertension, Pt. on medications and Pt. on home beta blockers + CAD, + CABG, + Peripheral Vascular Disease and +CHF  + dysrhythmias Atrial Fibrillation + Valvular Problems/Murmurs MR  Rhythm:Irregular Rate:Normal     Neuro/Psych negative neurological ROS  negative psych ROS   GI/Hepatic negative GI ROS, Neg liver ROS,   Endo/Other  negative endocrine ROS  Renal/GU ESRF and DialysisRenal disease  negative genitourinary   Musculoskeletal   Abdominal   Peds  Hematology negative hematology ROS (+) anemia ,   Anesthesia Other Findings   Reproductive/Obstetrics negative OB ROS                           Anesthesia Physical Anesthesia Plan  ASA: IV  Anesthesia Plan: General   Post-op Pain Management:    Induction: Intravenous  Airway Management Planned: Mask  Additional Equipment:   Intra-op Plan:   Post-operative Plan:   Informed Consent: I have reviewed the patients History and Physical, chart, labs and discussed the procedure including the risks, benefits and alternatives for the proposed anesthesia with the patient or authorized representative who has indicated his/her understanding and acceptance.   Dental advisory given  Plan Discussed with: CRNA  Anesthesia Plan Comments:         Anesthesia Quick Evaluation

## 2016-07-12 NOTE — Progress Notes (Signed)
Patient ID: [name], male   DOB: [birth year], 60 y.o.   MRN: [id number]  [facility] Progress Note    Subjective:   Feeling better.   Objective:   BP (!) 89/67 (BP Location: Left Arm)   Pulse 92   Temp 97.2 F (36.2 C) (Oral)   Resp 19   Ht '5\' 10"'$  (1.778 m)   Wt 82.4 kg (181 lb 10.5 oz)   SpO2 91%   BMI 26.07 kg/m   Intake/Output: I/O last 3 completed shifts: In: 1874.5 [P.O.:1290; I.V.:584.5] Out: -    Intake/Output this shift:  No intake/output data recorded. Weight change:   Physical Exam: Gen:WD WN WM in NAD CVS:IRR IRR Resp:bibasilar crackles QMG:QQPYPP Ext:+pretibial edema, L forearm AVF, +T/B, aneurysmal changes. No ulcerations.  Labs: BMET  Recent Labs Lab 07/09/16 0759 07/10/16 0305 07/11/16 0251 07/12/16 0237  NA 137 137 137 136  K 4.8 4.1 4.4 5.0  CL 98* 96* 95* 92*  CO2 '26 28 28 27  '$ GLUCOSE 81 82 101* 109*  BUN 43* 28* 46* 66*  CREATININE 7.22* 5.56* 7.64* 9.26*  ALBUMIN 3.5 3.1*  --  3.1*  CALCIUM 7.3* 7.6* 8.4* 8.5*  PHOS  --  5.8*  --  7.4*   CBC  Recent Labs Lab 07/09/16 0759 07/10/16 0305 07/11/16 0251 07/12/16 0237  WBC 7.5 7.4 7.8 7.0  NEUTROABS 6.0  --   --   --   HGB 12.4* 11.6* 12.0* 11.2*  HCT 37.7* 36.2* 37.5* 35.1*  MCV 101.9* 103.1* 102.2* 102.0*  PLT 150 152 161 147*    '@IMGRELPRIORS'$ @ Medications:    . amiodarone  400 mg Oral BID  . atorvastatin  40 mg Oral q1800  . calcium acetate  4,002 mg Oral TID WC  . citalopram  20 mg Oral QHS  . clopidogrel  75 mg Oral Daily  . docusate sodium  300 mg Oral BID  . feeding supplement (PRO-STAT SUGAR FREE 64)  30 mL Oral BID  . ferric gluconate (FERRLECIT/NULECIT) IV  125 mg Intravenous Q M,W,F-HD  . folic acid  509 mcg Oral Daily  . gabapentin  300 mg Oral QHS  . metoprolol tartrate  12.5 mg Oral BID  . midodrine  10 mg Oral Q M,W,F-HD  . ramelteon  8 mg Oral QHS  . sodium chloride flush  3 mL Intravenous Q12H  . Warfarin - Pharmacist Dosing  Inpatient   Does not apply q1800   Dialysis Orders: MWF [place] 4 hours 81 kg 1.0K/2.25 Ca 450/Auto 1.5  Linear Sodium UF profile 2 Heparin: 2000 units IV q Tx Mircera 50 mcg IV q 2 weeks (last HGB 12.2 07/07/16) Venofer: 100 mg IV X 3 doses (1/3 given) then 50 mg IV weekly. (Ferritin 584 Fe 61 Tsat 26 06/30/16)  Assessment/ Plan:   1. A fib/flutter with RVR- for cardioversion today.  On amiodarone with rate control but remains hypotensive 2. Decompensated CHF- s/p urgent HD with volume removal 07/10/16.  Grade 2 diastolic dysfunction EF 32%, strict fluid restriction 3. Hypotension- awaiting cardioversion later today.  Hopefully BP will improve, if not will likely require neo for HD and BP support. 4. ESRD MWF, for HD today after [place] 5. Anemia:no ESA due to Hgb above 12 PTA but now down to 11. will follow 6. CKD-MBD: continue with binders 7. Nutrition:renal diet 8. Vascular access- L forearm AVF +T/B  Donetta Potts, MD [place] Pager [phone] 07/12/2016, 9:35 AM

## 2016-07-12 NOTE — CV Procedure (Signed)
   Procedure:   DCCV  Indication:  Symptomatic atrial fib.  Procedure Note:  The patient signed informed consent.  He has had had therapeutic anticoagulation with warfarin greater than 3 weeks.  Anesthesia was administered by Dr. Ola Spurr.  Adequate airway was maintained throughout and vital followed per protocol.  He was cardioverted x 1 with 120J of biphasic synchronized energy.  He converted to NSR.  There were no apparent complications.  The patient had normal neuro status and respiratory status post procedure with vitals stable as recorded elsewhere.

## 2016-07-12 NOTE — Progress Notes (Signed)
  Date: 07/12/2016  Patient name: [name]  Medical record number: [id number]  Date of birth: September 28, 1955   This patient's plan of care was discussed with the house staff. Please see their note for complete details. I concur with their findings. Pt is without complaints.  Tele shows irregular rhythm, flutter.   Vitals:   07/12/16 0753 07/12/16 0754  BP:  (!) 89/67  Pulse: 92   Resp:  19  Temp:  97.2 F (36.2 C)   CV- irregular Chest- rales Ab- bs+, soft, non-tender. Ext- + bruit at AVF  Labs of note- K+ 5.0 TTE (03-25-16) EF 50-55%  A/P Aflutter- for cardioversion today.  Rate has improved.  Appreciate CV dosing of amio.  F/u TSH  Hypotension For HD possibly with pharmacologic assistance. .  Hopefully getting into regular rhythm will improve EF.   ESRD For HD today.   Campbell Riches, MD 07/12/2016, 9:20 AM

## 2016-07-12 NOTE — Progress Notes (Signed)
Patient Name: [name] Date of Encounter: 07/12/2016  Hospital Problem List     Principal Problem:   CHF (congestive heart failure) ([place]) Active Problems:   Hypotension   Renal transplant failure and rejection   PAF (paroxysmal atrial fibrillation) (H[facility])   ESRD on hemodialysis   SVT (supraventricular tachycardia) (H[facility])   Hx of CABG x 2 2014   CAD (coronary artery disease)   Acute CHF [facility])    Patient Profile     [name] is a 60 y.o. male with an extensive hx of CAD s/p 2V CABG in 2014 (Y SVG to LAD & D2), NSTEMI 01/2015 s/p DES to LCx and BMS to SVG-LAD at [facility], STEMI 02/2016 s/p PTCA/DES to SVG-LAD extending in native vessel. He has ESRD on HD, atrial fib, bioprosthetic AVR, moderate MR.  He was admitted with increased SOB.   Subjective   He feels OK .  No acute distress.  No SOB or pain.   Inpatient Medications    . atorvastatin  40 mg Oral q1800  . calcium acetate  4,002 mg Oral TID WC  . citalopram  20 mg Oral QHS  . clopidogrel  75 mg Oral Daily  . docusate sodium  300 mg Oral BID  . feeding supplement (PRO-STAT SUGAR FREE 64)  30 mL Oral BID  . ferric gluconate (FERRLECIT/NULECIT) IV  125 mg Intravenous Q M,W,F-HD  . folic acid  053 mcg Oral Daily  . gabapentin  300 mg Oral QHS  . metoprolol tartrate  12.5 mg Oral BID  . midodrine  10 mg Oral Q M,W,F-HD  . ramelteon  8 mg Oral QHS  . sodium chloride flush  3 mL Intravenous Q12H  . Warfarin - Pharmacist Dosing Inpatient   Does not apply q1800    Vital Signs    Vitals:   07/12/16 0500 07/12/16 0600 07/12/16 0753 07/12/16 0754  BP: (!) 87/62 (!) 89/70  (!) 89/67  Pulse:   92   Resp: '20 18  19  '$ Temp:    97.2 F (36.2 C)  TempSrc:    Oral  SpO2: 92% 94%  91%  Weight:      Height:        Intake/Output Summary (Last 24 hours) at 07/12/16 0820 Last data filed at 07/12/16 0600  Gross per 24 hour  Intake           1314.1 ml  Output                0 ml  Net           1314.1 ml   Filed Weights   07/09/16 1703 07/10/16 0300 07/11/16 0600  Weight: 176 lb 12.9 oz (80.2 kg) 177 lb 7.5 oz (80.5 kg) 181 lb 10.5 oz (82.4 kg)    Physical Exam    GEN: Well nourished, well developed, in  no acute distress.  Neck: Supple, no JVD, carotid bruits, or masses. Cardiac: Irregular, no rubs, or gallops. No clubbing, cyanosis, no edema.  Radials/DP/PT 2+ and equal bilaterally.  Respiratory:  Respirations  regular and unlabored, clear to auscultation bilaterally. GI: Soft, nontender, nondistended, BS + x 4. Neuro:  Strength and sensation are intact.   Labs    CBC  Recent Labs  07/11/16 0251 07/12/16 0237  WBC 7.8 7.0  HGB 12.0* 11.2*  HCT 37.5* 35.1*  MCV 102.2* 102.0*  PLT 161 976*   Basic Metabolic Panel  Recent Labs  07/10/16 0305 07/11/16 0251 07/12/16  0237  NA 137 137 136  K 4.1 4.4 5.0  CL 96* 95* 92*  CO2 '28 28 27  '$ GLUCOSE 82 101* 109*  BUN 28* 46* 66*  CREATININE 5.56* 7.64* 9.26*  CALCIUM 7.6* 8.4* 8.5*  MG  --  2.2  --   PHOS 5.8*  --  7.4*   Liver Function Tests  Recent Labs  07/10/16 0305 07/12/16 0237  ALBUMIN 3.1* 3.1*   No results for input(s): LIPASE, AMYLASE in the last 72 hours. Cardiac Enzymes No results for input(s): CKTOTAL, CKMB, CKMBINDEX, TROPONINI in the last 72 hours. BNP Invalid input(s): POCBNP D-Dimer No results for input(s): DDIMER in the last 72 hours. Hemoglobin A1C No results for input(s): HGBA1C in the last 72 hours. Fasting Lipid Panel No results for input(s): CHOL, HDL, LDLCALC, TRIG, CHOLHDL, LDLDIRECT in the last 72 hours. Thyroid Function Tests No results for input(s): TSH, T4TOTAL, T3FREE, THYROIDAB in the last 72 hours.  Invalid input(s): FREET3  Telemetry    Atrial fib with improved ventricular rate.   ECG    NA  Radiology    Dg Chest Port 1 View  Result Date: 07/09/2016 CLINICAL DATA:  Dialysis dependent renal failure, transplant failure, recent episode of tachycardia, increased fatigue, history of  coronary artery disease with stent placement EXAM: PORTABLE CHEST 1 VIEW COMPARISON:  PA and lateral chest x-ray of March 24, 2016 FINDINGS: The lungs are well-expanded. The interstitial markings are increased bilaterally and slightly more conspicuous than in the past. There is no alveolar infiltrate. A trace of pleural fluid blunts the right lateral costophrenic angle. There is no pneumothorax. The cardiac silhouette is enlarged. There are sternal wires present. The uppermost sternal wire is chronically broken. There is calcification in the wall of the aortic arch. IMPRESSION: Findings consistent with low-grade CHF superimposed upon COPD. No alveolar pneumonia. Aortic atherosclerosis. Electronically Signed   By: David  Martinique M.D.   On: 07/09/2016 08:32    Assessment & Plan    ATRIAL FIB:  Rate is better.  I think it would be reasonable to try to cardiovert.  I will check a TSH and I will switch to PO amiodarone.  Liver enzymes OK.    CAD:  No evidence of ischemia.  Continue current therapy.  ACUTE ON CHRONIC DIASTOLIC HF:  Volume management per HD.   AVR:  Echo was OK in June. Continue to follow.      Signed, Minus Breeding, MD  07/12/2016, 8:20 AM

## 2016-07-12 NOTE — Progress Notes (Signed)
ANTICOAGULATION CONSULT NOTE - Follow-up Consult  Pharmacy Consult for Coumadin Indication: atrial fibrillation  Patient Measurements: Height: '5\' 10"'$  (177.8 cm) Weight: 181 lb 10.5 oz (82.4 kg) IBW/kg (Calculated) : 73  Vital Signs: Temp: 97.2 F (36.2 C) (11/06 0754) Temp Source: Oral (11/06 0754) BP: 89/67 (11/06 0754) Pulse Rate: 92 (11/06 0753)  Labs:  Recent Labs  07/10/16 0305 07/11/16 0251 07/12/16 0237  HGB 11.6* 12.0* 11.2*  HCT 36.2* 37.5* 35.1*  PLT 152 161 147*  LABPROT 29.8* 28.6* 26.4*  INR 2.76 2.63 2.38  CREATININE 5.56* 7.64* 9.26*    Estimated Creatinine Clearance: 8.6 mL/min (by C-G formula based on SCr of 9.26 mg/dL (H)).  Assessment: 54 yoM w/ESRD-HD MWF and Hx of afib on Coumadin. Remains therapeutic on home dose (2.38). Of note, patient now on amio  as of 11/4 which can increase warfarin levels. Amio transitioned to oral today (11/6). Likely cardioversion today/tomorrow.   CBC stable. No signs of bleeding noted.   PTA: 7.'5mg'$  daily except 3.'75mg'$  on Tues/Sat - last taken 11/2  Goal of Therapy:  INR 2-3 Monitor platelets by anticoagulation protocol: Yes   Plan:  Repeat Coumadin 7.'5mg'$  tonight Daily INR CBC at least every 72 hours  Uvaldo Bristle, PharmD PGY1 Pharmacy Resident  Pager: [phone] 07/12/16 10:16 AM

## 2016-07-13 ENCOUNTER — Telehealth: Payer: Self-pay | Admitting: *Deleted

## 2016-07-13 DIAGNOSIS — I471 Supraventricular tachycardia: Secondary | ICD-10-CM

## 2016-07-13 DIAGNOSIS — I5041 Acute combined systolic (congestive) and diastolic (congestive) heart failure: Secondary | ICD-10-CM

## 2016-07-13 LAB — BASIC METABOLIC PANEL
Anion gap: 15 (ref 5–15)
BUN: 28 mg/dL — ABNORMAL HIGH (ref 6–20)
CALCIUM: 8.4 mg/dL — AB (ref 8.9–10.3)
CO2: 29 mmol/L (ref 22–32)
CREATININE: 5.27 mg/dL — AB (ref 0.61–1.24)
Chloride: 93 mmol/L — ABNORMAL LOW (ref 101–111)
GFR calc Af Amer: 12 mL/min — ABNORMAL LOW (ref >60)
GFR calc non Af Amer: 11 mL/min — ABNORMAL LOW (ref >60)
GLUCOSE: 91 mg/dL (ref 65–99)
Potassium: 4.1 mmol/L (ref 3.5–5.1)
Sodium: 137 mmol/L (ref 135–145)

## 2016-07-13 LAB — PROTIME-INR
INR: 2.68
PROTHROMBIN TIME: 29.1 s — AB (ref 11.4–15.2)

## 2016-07-13 LAB — TSH: TSH: 1.8 u[IU]/mL (ref 0.350–4.500)

## 2016-07-13 MED ORDER — MIDODRINE HCL 10 MG PO TABS: 10.0000 mg | ORAL_TABLET | ORAL | Status: DC

## 2016-07-13 MED ORDER — WARFARIN SODIUM 7.5 MG PO TABS: 3.7500 mg | ORAL_TABLET | Freq: Once | ORAL | Status: DC

## 2016-07-13 MED ORDER — METOPROLOL TARTRATE 25 MG PO TABS: 12.5000 mg | ORAL_TABLET | Freq: Two times a day (BID) | ORAL | 3 refills | Status: DC

## 2016-07-13 MED ORDER — AMIODARONE HCL 400 MG PO TABS: 400.0000 mg | ORAL_TABLET | Freq: Two times a day (BID) | ORAL | 0 refills | Status: DC

## 2016-07-13 NOTE — Progress Notes (Addendum)
Spoke w pt. He has home concentrator and only uses home o2 at [employer]. He now needs oxygen around  the clock. They want to use [facility] in [place] for home oxygen. md to place order and will fax in and get portable o2 del to room. Per nse patty [place] room air sats run 86-87%.  When patient ambulated they placed him on oxygen at 2lit and sats dec to 83% ambulating with oxygen on. sats 86-87% at rest on room air. Pt recovered w 3lit oxygen per nasal canula to 90%.

## 2016-07-13 NOTE — Progress Notes (Signed)
ANTICOAGULATION CONSULT NOTE - Follow-up Consult  Pharmacy Consult for Coumadin Indication: atrial fibrillation  Patient Measurements: Height: '5\' 10"'$  (177.8 cm) Weight: 182 lb 8.7 oz (82.8 kg) IBW/kg (Calculated) : 73  Vital Signs: Temp: 97.4 F (36.3 C) (11/07 0757) Temp Source: Oral (11/07 0757) BP: 98/72 (11/07 0951) Pulse Rate: 62 (11/07 1100)  Labs:  Recent Labs  07/11/16 0251 07/12/16 0237 07/13/16 0351  HGB 12.0* 11.2*  --   HCT 37.5* 35.1*  --   PLT 161 147*  --   LABPROT 28.6* 26.4* 29.1*  INR 2.63 2.38 2.68  CREATININE 7.64* 9.26* 5.27*    Estimated Creatinine Clearance: 15.2 mL/min (by C-G formula based on SCr of 5.27 mg/dL (H)).  Assessment: 60 yoM w/ESRD-HD MWF and Hx of afib on Coumadin. Remains therapeutic on home dose (2.68). Of note, patient now on amio  as of 11/4 which can increase warfarin levels. Amio transitioned to oral 11/6. Patient successfully cardioverted 11/6. Planned amiodarone 400 mg twice a day for 2 weeks, then 200 mg twice a day for 2 weeks, then 200 mg once a day.  CBC stable. No signs of bleeding noted.   PTA: 7.'5mg'$  daily except 3.'75mg'$  on Tues/Sat - last taken 11/2  Goal of Therapy:  INR 2-3 Monitor platelets by anticoagulation protocol: Yes   Plan:  Warfarin 3.75 mg X 1 tonight Daily INR CBC at least every 72 hours Recommend that patient follow-up in the coumadin clinic within a week of discharge.  Could consider warfarin 7.5 mg on Tuesday and Saturday, and 3.75 mg all other days for amiodarone interaction Will follow-up with coumadin clinic  Uvaldo Bristle, PharmD PGY1 Pharmacy Resident  Pager: [phone] 07/13/16 11:25 AM

## 2016-07-13 NOTE — Care Management Note (Signed)
Case Management Note  Patient Details  Name: Vinnie Gombert MRN: [id number] Date of Birth: [birth year]  Subjective/Objective:     Adm w chf               Action/Plan: lives at home w family. Has home oxygen but switching to [place] [facility] in [place].   Expected Discharge Date:                  Expected Discharge Plan:     In-House Referral:     Discharge planning Services     Post Acute Care Choice:    Choice offered to:     DME Arranged:    DME Agency:     HH Arranged:    HH Agency:     Status of Service:     If discussed at [employer], dates discussed:    Additional Comments: will fax over home oxygen orders to [facility] and req port o2 to be del to room.  Lacretia Leigh, RN 07/13/2016, 11:42 AM

## 2016-07-13 NOTE — Progress Notes (Signed)
PT Cancellation Note  Patient Details Name: [name] MRN: [id number] DOB: 1956/05/03   Cancelled Treatment:    Reason Eval/Treat Not Completed: PT screened, no needs identified, will sign off; spoke with RN and pt and no PT needs identified (walked earlier with RN for O2 saturation for home O2.)  Will sign off.   Reginia Naas 07/13/2016, 11:44 AM Magda Kiel, PT [phone] 07/13/2016

## 2016-07-13 NOTE — Telephone Encounter (Signed)
Pharmacy called with clarification of metoprolol, # was not = to dose, he is to take 1/2 tablet twice daily, changed to #90 w/ 3 refills from 180 w/ 3 refills, do you agree? If so please change in medlist, thanks

## 2016-07-13 NOTE — Progress Notes (Signed)
  Date: 07/13/2016  Patient name: Tajee Savant  Medical record number: [id number]  Date of birth: 07/21/1956   This patient's plan of care was discussed with the house staff. Please see their note for complete details. I concur with their findings. Pt without complaints. Upon ambulating to bathroom had no SOB or palpitations.  Tele notes NSR with BBB  Vitals:   07/13/16 1036 07/13/16 1100  BP:    Pulse: 60 62  Resp: 19 18  Temp:     cv- rrr Chest- cta abd- bs+, soft, nontender Extr- + bruit at HD site.   Labs-  TSH normal   A/P Afib with RVR Will discuss with CV regarding long term anticoagulation (has bioprosthetic AVR as well).  May need home O2, noted results of his walk test (desat) PT/OT  ESRD Will get next HD on 11-8 BP have been soft but stable.   Campbell Riches, MD 07/13/2016, 10:59 AM

## 2016-07-13 NOTE — Plan of Care (Signed)
Patient ambulated in hall, steady ofn feet,  Oxygen at 2L/min, tolerated well after walk sat down to 83%.

## 2016-07-13 NOTE — Progress Notes (Signed)
   Subjective: Patient was feeling better this morning, denies any chest pain and shortness of breath while walking to bathroom. He had his cardioversion done yesterday, tolerated the procedure very well. He also had his routine hemodialysis session yesterday, his blood pressure did drop to 78/58 for a short period of time during dialysis, he does not required any pressor and was able to complete his session.  Objective:  Vital signs in last 24 hours: Vitals:   07/13/16 0900 07/13/16 0951 07/13/16 1000 07/13/16 1004  BP:  98/72    Pulse: 65 63 63 65  Resp: '16 19 19 19  '$ Temp:      TempSrc:      SpO2: 93% 91% (!) 87% 91%  Weight:      Height:       Telemetry review. Normal sinus rhythm with a few PVCs.  Gen. well-built, well-developed man, alert and oriented, in no acute distress. Lungs. few faint  Bilateral basal crackles. CV. Regular rate and rhythm. Abdomen. Soft, nontender, bowel sounds positive Extremities. No edema, dry skin with chronic venous stasis changes bilaterally, left upper extremity forearm fistula with palpable thrill.  Assessment/Plan:  60 year old male withpast medical history of end-stage renal disease, history of failed renal transplant, paroxysmal A. fib, diastolic CHF, History of aortic valve endocarditis s/p bioprosthetic replacement, and coronary artery disease who presents from hemodialysis with tachycardia and hypotension.  Atrial flutter/fibrillation. He had his cardioversion done yesterday. He was converted to normal sinus rhythm with 1 shock of 120 J. tolerated the procedure very well. He remained in sinus rhythm since then. His TSH is within normal limit. Cardiology is recommending amiodarone 400 mg twice daily for 2 weeks, then 200 mg twice daily for another 2 weeks, followed by 200 mg daily. -Continue Coumadin. -He will follow-up with cardiology as directed.  ESRD. He had his dialysis yesterday after cardioversion. He did had a drop in his blood  pressure for a short period of time up to 78/58, but never required any pressor. He denies any shortness of breath or chest pain during dialysis. He was able to complete 4 hour session with them at loss of fluid 1345 mL. -Continue his routine dialysis at Monday Wednesday and Friday.  HFpEF: Known grade 2 diastolic dysfunction with 55% LVEF. He is still having faint bibasilar crackles. Denies any shortness of breath.  Hypoxia on room air. Patient hasn't history of COPD, he was on 2-3 L of oxygen at night. Today he desaturated to 87% on room air while sitting in his bed. He will need reevaluation of his home oxygen use with and without ambulation. Might need continuous oxygen  at home. -PT and OT evaluation.  Hypotension:. His blood pressure improved and he was normotensive today.  CAD:s/p 2V CABG in 2014 (Y SVG to LAD & D2), NSTEMI 01/2015 s/p DES to LCx and BMS to SVG-LAD at [facility], STEMI 02/2016 s/p PTCA/DES to SVG-LAD extending in native vessel. Not in acute coronary syndrome.  Anemia:Chronic and near baseline value.  Dispo: Will be discharged today after PT and OT evaluation.  [name], MD 07/13/2016, 10:25 AM Pager: [phone]

## 2016-07-13 NOTE — Progress Notes (Signed)
Patient ID: [name], male   DOB: March 04, 1956, 60 y.o.   MRN: [id number]  [facility] Progress Note    Subjective:   Mr. Slape underwent successful DC cardioversion yesterday and was able to tolerate HD without issue.   Objective:   BP 106/80   Pulse (!) 57   Temp 97.4 F (36.3 C) (Oral)   Resp 16   Ht '5\' 10"'$  (1.778 m)   Wt 82.8 kg (182 lb 8.7 oz)   SpO2 95%   BMI 26.19 kg/m   Intake/Output: I/O last 3 completed shifts: In: 863.7 [P.O.:480; I.V.:383.7] Out: 9379 [Other:1345]   Intake/Output this shift:  Total I/O In: 120 [P.O.:120] Out: -  Weight change:   Physical Exam: Gen:WD WN WM in NAD CVS:RRR no rub Resp:cta KWI:OXBDZH Ext:no edema LAVF +T/B  Labs: BMET  Recent Labs Lab 07/09/16 0759 07/10/16 0305 07/11/16 0251 07/12/16 0237 07/13/16 0351  NA 137 137 137 136 137  K 4.8 4.1 4.4 5.0 4.1  CL 98* 96* 95* 92* 93*  CO2 '26 28 28 27 29  '$ GLUCOSE 81 82 101* 109* 91  BUN 43* 28* 46* 66* 28*  CREATININE 7.22* 5.56* 7.64* 9.26* 5.27*  ALBUMIN 3.5 3.1*  --  3.1*  --   CALCIUM 7.3* 7.6* 8.4* 8.5* 8.4*  PHOS  --  5.8*  --  7.4*  --    CBC  Recent Labs Lab 07/09/16 0759 07/10/16 0305 07/11/16 0251 07/12/16 0237  WBC 7.5 7.4 7.8 7.0  NEUTROABS 6.0  --   --   --   HGB 12.4* 11.6* 12.0* 11.2*  HCT 37.7* 36.2* 37.5* 35.1*  MCV 101.9* 103.1* 102.2* 102.0*  PLT 150 152 161 147*    '@IMGRELPRIORS'$ @ Medications:    . amiodarone  400 mg Oral BID  . atorvastatin  40 mg Oral q1800  . calcium acetate  4,002 mg Oral TID WC  . citalopram  20 mg Oral QHS  . clopidogrel  75 mg Oral Daily  . docusate sodium  300 mg Oral BID  . feeding supplement (PRO-STAT SUGAR FREE 64)  30 mL Oral BID  . ferric gluconate (FERRLECIT/NULECIT) IV  125 mg Intravenous Q M,W,F-HD  . folic acid  299 mcg Oral Daily  . gabapentin  300 mg Oral QHS  . metoprolol tartrate  12.5 mg Oral BID  . midodrine  10 mg Oral Q M,W,F-HD  . ramelteon  8 mg Oral QHS  . sodium  chloride flush  3 mL Intravenous Q12H  . Warfarin - Pharmacist Dosing Inpatient   Does not apply q1800   Dialysis Orders: MWF [place] 4 hours 81 kg 1.0K/2.25 Ca 450/Auto 1.5  Linear Sodium UF profile 2 Heparin: 2000 units IV q Tx Mircera 50 mcg IV q 2 weeks (last HGB 12.2 07/07/16) Venofer: 100 mg IV X 3 doses (1/3 given) then 50 mg IV weekly. (Ferritin 584 Fe 61 Tsat 26 06/30/16)  Assessment/ Plan:   1. A fib/flutter with RVR- for cardioversion today.  On amiodarone with rate control but remains hypotensive 2. Decompensated CHF- s/p urgent HD with volume removal 07/10/16.  Grade 2 diastolic dysfunction EF 24%, strict fluid restriction 3. Hypotension- s/p cardioversion 07/12/16 and in NSR.  BP did well with HD yesterday and he remained in NSR.   4. ESRD MWF, continue with schedule at his outpatient unit tomorrow. 5. Anemia:no ESA due to Hgb above 12 PTA but now down to 11. will follow 6. CKD-MBD: continue with binders 7. Nutrition:renal  diet 8. Vascular access- L forearm AVF +T/B 9. Disposition- stable for discharge to home per Cardiology and f/u at [facility] kidney center for HD tomorrow.   Donetta Potts, MD [place] Pager [phone] 07/13/2016, 8:48 AM

## 2016-07-13 NOTE — Progress Notes (Signed)
Patient Name: [name] Date of Encounter: 07/13/2016  Hospital Problem List     Principal Problem:   CHF (congestive heart failure) ([place]) Active Problems:   Hypotension   Renal transplant failure and rejection   PAF (paroxysmal atrial fibrillation) (HCC)   ESRD on hemodialysis   SVT (supraventricular tachycardia) (HCC)   Hx of CABG x 2 2014   CAD (coronary artery disease)   Acute CHF [facility])    Patient Profile     [name] is a 60 y.o. male with an extensive hx of CAD s/p 2V CABG in 2014 (Y SVG to LAD & D2), NSTEMI 01/2015 s/p DES to LCx and BMS to SVG-LAD at [facility], STEMI 02/2016 s/p PTCA/DES to SVG-LAD extending in native vessel. He has ESRD on HD, atrial fib, bioprosthetic AVR, moderate MR.  He was admitted with increased SOB.   Subjective   He feels OK .DCCV yesterday. No SOB.  No pain.   Inpatient Medications    . amiodarone  400 mg Oral BID  . atorvastatin  40 mg Oral q1800  . calcium acetate  4,002 mg Oral TID WC  . citalopram  20 mg Oral QHS  . clopidogrel  75 mg Oral Daily  . docusate sodium  300 mg Oral BID  . feeding supplement (PRO-STAT SUGAR FREE 64)  30 mL Oral BID  . ferric gluconate (FERRLECIT/NULECIT) IV  125 mg Intravenous Q M,W,F-HD  . folic acid  833 mcg Oral Daily  . gabapentin  300 mg Oral QHS  . metoprolol tartrate  12.5 mg Oral BID  . midodrine  10 mg Oral Q M,W,F-HD  . ramelteon  8 mg Oral QHS  . sodium chloride flush  3 mL Intravenous Q12H  . Warfarin - Pharmacist Dosing Inpatient   Does not apply q1800    Vital Signs    Vitals:   07/12/16 2326 07/13/16 0308 07/13/16 0757 07/13/16 0800  BP: 96/78 97/81 106/80 106/80  Pulse: 62 62 61 (!) 57  Resp: 16 (!) '23 20 16  '$ Temp: 97.5 F (36.4 C) 97.6 F (36.4 C) 97.4 F (36.3 C)   TempSrc: Oral Oral Oral   SpO2: 94% 91% 96% 95%  Weight:  182 lb 8.7 oz (82.8 kg)    Height:        Intake/Output Summary (Last 24 hours) at 07/13/16 0838 Last data filed at 07/13/16 0600  Gross per 24  hour  Intake              680 ml  Output             1345 ml  Net             -665 ml   Filed Weights   07/12/16 1430 07/12/16 1851 07/13/16 0308  Weight: 187 lb 2.7 oz (84.9 kg) 181 lb 3.5 oz (82.2 kg) 182 lb 8.7 oz (82.8 kg)    Physical Exam    GEN: Well nourished, well developed, in  no acute distress.  Neck: Supple, no JVD, carotid bruits, or masses. Cardiac: RRR, no rubs, or gallops. No clubbing, cyanosis, no edema.  Radials/DP/PT 2+ and equal bilaterally.  Respiratory:  Respirations  regular and unlabored, clear to auscultation bilaterally. GI: Soft, nontender, nondistended, BS + x 4. Neuro:  Strength and sensation are intact.   Labs    CBC  Recent Labs  07/11/16 0251 07/12/16 0237  WBC 7.8 7.0  HGB 12.0* 11.2*  HCT 37.5* 35.1*  MCV 102.2* 102.0*  PLT 161 818*   Basic Metabolic Panel  Recent Labs  07/11/16 0251 07/12/16 0237 07/13/16 0351  NA 137 136 137  K 4.4 5.0 4.1  CL 95* 92* 93*  CO2 '28 27 29  '$ GLUCOSE 101* 109* 91  BUN 46* 66* 28*  CREATININE 7.64* 9.26* 5.27*  CALCIUM 8.4* 8.5* 8.4*  MG 2.2  --   --   PHOS  --  7.4*  --    Liver Function Tests  Recent Labs  07/12/16 0237  ALBUMIN 3.1*   No results for input(s): LIPASE, AMYLASE in the last 72 hours. Cardiac Enzymes No results for input(s): CKTOTAL, CKMB, CKMBINDEX, TROPONINI in the last 72 hours. BNP Invalid input(s): POCBNP D-Dimer No results for input(s): DDIMER in the last 72 hours. Hemoglobin A1C No results for input(s): HGBA1C in the last 72 hours. Fasting Lipid Panel No results for input(s): CHOL, HDL, LDLCALC, TRIG, CHOLHDL, LDLDIRECT in the last 72 hours. Thyroid Function Tests  Recent Labs  07/13/16 0351  TSH 1.800    Telemetry    NSR.    ECG    NA  Radiology    Dg Chest Port 1 View  Result Date: 07/09/2016 CLINICAL DATA:  Dialysis dependent renal failure, transplant failure, recent episode of tachycardia, increased fatigue, history of coronary artery  disease with stent placement EXAM: PORTABLE CHEST 1 VIEW COMPARISON:  PA and lateral chest x-ray of March 24, 2016 FINDINGS: The lungs are well-expanded. The interstitial markings are increased bilaterally and slightly more conspicuous than in the past. There is no alveolar infiltrate. A trace of pleural fluid blunts the right lateral costophrenic angle. There is no pneumothorax. The cardiac silhouette is enlarged. There are sternal wires present. The uppermost sternal wire is chronically broken. There is calcification in the wall of the aortic arch. IMPRESSION: Findings consistent with low-grade CHF superimposed upon COPD. No alveolar pneumonia. Aortic atherosclerosis. Electronically Signed   By: David  Martinique M.D.   On: 07/09/2016 08:32    Assessment & Plan    ATRIAL FIB:  NSR, on warfarin.  On PO amiodarone.  No change in therapy.  OK to discharge.  Continue amiodarone 400 bid x 2 weeks. And then 200 mg bid x 2 weeks.  Then 200 mg dialy.  We will arrange follow up.  Warfarin follow up per our office.    CAD:  No evidence of ischemia.  Continue current therapy.  ACUTE ON CHRONIC DIASTOLIC HF:  Volume management per HD.   AVR:  Echo was OK in June. Continue to follow.      Signed, Minus Breeding, MD  07/13/2016, 8:38 AM

## 2016-07-13 NOTE — Discharge Summary (Signed)
Name: [name] MRN: [id number] DOB: 08-09-1956 60 y.o. PCP: Cameron Greenhouse, MD  Date of Admission: 07/09/2016  7:36 AM Date of Discharge: 07/13/2016 Attending Physician: Cameron Belt, MD  Discharge Diagnosis: 1. Atrial fibrillation/flutter 2. CHF exacerbation 3. ESRD on hemodialysis 4. CAD 5. COPD  Discharge Medications:   Medication List    TAKE these medications   acetaminophen 500 MG tablet Commonly known as:  TYLENOL Take 1,000 mg by mouth every 6 (six) hours as needed for pain or fever.   amiodarone 400 MG tablet Commonly known as:  PACERONE Take 1 tablet (400 mg total) by mouth 2 (two) times daily. Please take 400 mg twice daily for 2 weeks, then decrease your dose to 200 mg twice daily for another 2 weeks, then take 200 mg just once daily.   atorvastatin 40 MG tablet Commonly known as:  LIPITOR Take 40 mg by mouth daily.   calcitRIOL 0.25 MCG capsule Commonly known as:  ROCALTROL Take 1 capsule (0.25 mcg total) by mouth daily.   calcium acetate 667 MG capsule Commonly known as:  PHOSLO Take 4,002 mg by mouth 3 (three) times daily with meals.   citalopram 20 MG tablet Commonly known as:  CELEXA Take 20 mg by mouth at bedtime.   clopidogrel 75 MG tablet Commonly known as:  PLAVIX Take 1 tablet (75 mg total) by mouth daily.   docusate sodium 100 MG capsule Commonly known as:  COLACE Take 300 mg by mouth 2 (two) times daily.   folic acid 1 MG tablet Commonly known as:  FOLVITE Take 400 mcg by mouth daily.   gabapentin 300 MG capsule Commonly known as:  NEURONTIN Take 300 mg by mouth at bedtime.   metoprolol tartrate 25 MG tablet Commonly known as:  LOPRESSOR Take 0.5 tablets (12.5 mg total) by mouth 2 (two) times daily. What changed:  how much to take   midodrine 10 MG tablet Commonly known as:  PROAMATINE Take 1 tablet (10 mg total) by mouth every Monday, Wednesday, and Friday with hemodialysis. Start taking on:  07/14/2016     multivitamin Tabs tablet Take 1 tablet by mouth daily.   nitroGLYCERIN 0.4 MG SL tablet Commonly known as:  NITROSTAT Place 0.4 mg under the tongue every 5 (five) minutes as needed for chest pain (Take one tabe every 5-10 minutes for chest pain. Once you have taken three tabs you need to notify your MD or go to ER if chest pain not gone.).   PROAIR HFA 108 (90 Base) MCG/ACT inhaler Generic drug:  albuterol Inhale 1-2 puffs into the lungs every 6 (six) hours as needed for shortness of breath.   ranitidine 150 MG tablet Commonly known as:  ZANTAC Take 150 mg by mouth daily.   sodium polystyrene 15 GM/60ML suspension Commonly known as:  KAYEXALATE Take 15 g by mouth See admin instructions. Only take 15 g on Sun / Tues / Thurs / Sat (non dialysis days)   warfarin 7.5 MG tablet Commonly known as:  COUMADIN Take 3.75-7.5 mg by mouth as directed. Takes 1/2 tab on Tues, Sat 3.'75mg'$  Takes 1 tab all other days            Durable Medical Equipment        Start     Ordered   07/13/16 1150  For home use only DME oxygen  Once    Comments:  He will need 3 L of oxygen with ambulation, he can have 2 L with  the rest and night.  Question Answer Comment  Mode or (Route) Nasal cannula   Liters per Minute 3   Frequency Continuous (stationary and portable oxygen unit needed)   Oxygen delivery system Gas      07/13/16 1151      Disposition and follow-up:   Mr.[name] was discharged from [facility] in Good condition.  At the hospital follow up visit please address:  1. His home oxygen requirement. His heart rate and rhythm.  2.  Labs / imaging needed at time of follow-up: Renal Function  3.  Pending labs/ test needing follow-up: None  Follow-up Appointments: Follow-up Information    Weiss,ROBERT, MD. Schedule an appointment as soon as possible for a visit.   Specialty:  Family Medicine Why:  Please make a follow-up appointment with your primary care within 1  week. Contact information: [place] [address] [phone]           Hospital Course by problem list:  60 year old male withpast medical history of end-stage renal disease, history of failed renal transplant, paroxysmal A. fib, diastolic CHF, History of aortic valve endocarditis s/p bioprosthetic replacement, and coronary artery disease who presents from hemodialysis with tachycardia and hypotension.  Atrial flutter/fibrillation. He was found to be in A. fib with RVR. He was managed initially with IV amiodarone, which did control his rate but he stays in A. fib/flutter for 2 days. He was cardioverted on Monday, 07/12/2016, he resume his sinus rhythm and remained in sinus rhythm with good rate control after more than 24 hour. His hypotension improved. He was discharged home on oral amiodarone of 400 mg twice daily for 2 weeks, then 200 mg twice daily for another 2 weeks, and then 200 mg once daily. He needs to follow up with cardiology for A. fib and Coumadin use.  ESRD. He was volume overload on admission because of incomplete 2 dialysis sessions. He was dialyzed once with the help of pressors to maintain his blood pressure. His shortness of breath improved after dialysis. Second dialysis session was done after cardioversion, he was able to tolerate it well without the help of any pressors. -He should continue his scheduled dialysis on Monday Wednesday and Friday.  HFpEF:Known grade 2 diastolic dysfunction with 55% LVEF. He is still having faint bibasilar crackles. Denies any shortness of breath.  Hypoxia on room air. According to patient he was on home oxygen 2 L at night, he was told recently that he needs oxygen continuously by his PCP. He was in the process of switching his home oxygen supply companies. He was desaturated during to 87% on room air while resting requiring 2 L of oxygen all the time. He was desaturating to 83% with 2 L of oxygen with ambulation. He  will need at least 3 L of oxygen with ambulation at this point and 2 L at rest and during sleep. He will need a close follow-up with PCP to monitor and adjust his oxygen requirement.  Hypotension:.  He was hypotensive on admission and remained hypotensive during most of his stay. Most probably  due to atrial fibrillation/flutter causing decreased cardiac output. His blood pressure improved after cardioversion to sinus rhythm.  CAD:s/p 2V CABG in 2014 (Y SVG to LAD & D2), NSTEMI 01/2015 s/p DES to LCx and BMS to SVG-LAD at [facility], STEMI 02/2016 s/p PTCA/DES to SVG-LAD extending in native vessel. Not in acute coronary syndrome during this hospital admission, which was ruled out with EKG and  trending negative troponin.  Discharge Vitals:   BP 92/70 (BP Location: Right Arm)   Pulse 62   Temp 97.9 F (36.6 C) (Oral)   Resp 15   Ht '5\' 10"'$  (1.778 m)   Wt 182 lb 8.7 oz (82.8 kg)   SpO2 94%   BMI 26.19 kg/m   Gen. well-built, well-developed man, alert and oriented, in no acute distress. Lungs. few faint Bilateral basal crackles. CV. Regular rate and rhythm. Abdomen. Soft, nontender, bowel sounds positive Extremities.No edema,dry skin with chronic venous stasis changes bilaterally,left upper extremity forearm fistula with palpable thrill.  Pertinent Labs, Studies, and Procedures:   BMP Latest Ref Rng & Units 07/13/2016 07/12/2016 07/11/2016  Glucose 65 - 99 mg/dL 91 109(H) 101(H)  BUN 6 - 20 mg/dL 28(H) 66(H) 46(H)  Creatinine 0.61 - 1.24 mg/dL 5.27(H) 9.26(H) 7.64(H)  Sodium 135 - 145 mmol/L 137 136 137  Potassium 3.5 - 5.1 mmol/L 4.1 5.0 4.4  Chloride 101 - 111 mmol/L 93(L) 92(L) 95(L)  CO2 22 - 32 mmol/L '29 27 28  '$ Calcium 8.9 - 10.3 mg/dL 8.4(L) 8.5(L) 8.4(L)   CBC Latest Ref Rng & Units 07/12/2016 07/11/2016 07/10/2016  WBC 4.0 - 10.5 K/uL 7.0 7.8 7.4  Hemoglobin 13.0 - 17.0 g/dL 11.2(L) 12.0(L) 11.6(L)  Hematocrit 39.0 - 52.0 % 35.1(L) 37.5(L) 36.2(L)  Platelets 150 - 400 K/uL 147(L)  161 152   BNP.2414.3 INR. 2.68 on discharge TSH. 1.80  ECG. ( On Admission): Initially showing sinus tachycardia, later developed atrial fibrillation with flutter waves, with RVR. On discharge. Normal sinus rhythm with 1st degree A-V block with Premature atrial complexes with Abberant conduction.  CXR. FINDINGS: The lungs are well-expanded. The interstitial markings are increased bilaterally and slightly more conspicuous than in the past. There is no alveolar infiltrate. A trace of pleural fluid blunts the right lateral costophrenic angle. There is no pneumothorax. The cardiac silhouette is enlarged. There are sternal wires present. The uppermost sternal wire is chronically broken. There is calcification in the wall of the aortic arch.  IMPRESSION: Findings consistent with low-grade CHF superimposed upon COPD. No alveolar pneumonia.  Aortic atherosclerosis.  Discharge Instructions: Discharge Instructions    Diet - low sodium heart healthy    Complete by:  As directed    Discharge instructions    Complete by:  As directed    It was pleasure taking care of you. Please make an follow-up appointment with your primary care with the next one week, to monitor and adjust your oxygen requirement. Please use 2 L of oxygen while resting and sleeping. You will need 3 L of oxygen with ambulation/when moving around. Please go for your dialysis as scheduled. Please follow-up with your cardiologist as directed.   Increase activity slowly    Complete by:  As directed       Signed: [name], MD 07/13/2016, 1:50 PM   Pager: [phone]

## 2016-07-14 ENCOUNTER — Telehealth: Payer: Self-pay | Admitting: *Deleted

## 2016-07-14 DIAGNOSIS — N186 End stage renal disease: Secondary | ICD-10-CM | POA: Diagnosis not present

## 2016-07-14 DIAGNOSIS — E875 Hyperkalemia: Secondary | ICD-10-CM | POA: Diagnosis not present

## 2016-07-14 DIAGNOSIS — Z23 Encounter for immunization: Secondary | ICD-10-CM | POA: Diagnosis not present

## 2016-07-14 DIAGNOSIS — D509 Iron deficiency anemia, unspecified: Secondary | ICD-10-CM | POA: Diagnosis not present

## 2016-07-14 LAB — CULTURE, BLOOD (ROUTINE X 2)
CULTURE: NO GROWTH
CULTURE: NO GROWTH

## 2016-07-14 NOTE — Telephone Encounter (Signed)
[place] calls with another clarification of a prescribed med,  Amiodarone '400mg'$  1 tablet twice daily for 2 weeks = 14 then '200mg'$  daily for 2 weeks = 7 then '200mg'$  daily thereafter Month 1 would have been 15 BUT The pharmacy only had '200mg'$  tablets so I changed the # to 42 for the first month and 1 refill of #30 Are you ok with this?

## 2016-07-14 NOTE — Telephone Encounter (Signed)
That's fine, as far as he gets right dose.

## 2016-07-14 NOTE — Telephone Encounter (Signed)
We decreased his metoprolol because he was started on amiodaron, 90 tablets were his current dose with 90 days supply.His cardiologist can titrate his dose later on.

## 2016-07-16 ENCOUNTER — Telehealth: Payer: Self-pay | Admitting: Cardiovascular Disease

## 2016-07-16 DIAGNOSIS — N186 End stage renal disease: Secondary | ICD-10-CM | POA: Diagnosis not present

## 2016-07-16 DIAGNOSIS — E875 Hyperkalemia: Secondary | ICD-10-CM | POA: Diagnosis not present

## 2016-07-16 DIAGNOSIS — D509 Iron deficiency anemia, unspecified: Secondary | ICD-10-CM | POA: Diagnosis not present

## 2016-07-16 DIAGNOSIS — Z23 Encounter for immunization: Secondary | ICD-10-CM | POA: Diagnosis not present

## 2016-07-16 NOTE — Telephone Encounter (Signed)
New Message:    Pt have taken too much of his medicine,please call to advise.

## 2016-07-16 NOTE — Telephone Encounter (Signed)
I called and spoke with Mrs. Sick (DPR).  She states the patient took his regular scheduled night time meds last night around 7:30 pm. He fell asleep - when he woke up he took his night time meds again around 12am. He is currently on amiodarone 200 mg BID, warfarin- 7.5 mg dose for yesterday, metoprolol 12.5 mg BID, citolapram at night, gabapentin at night.  I advised her that I am mainly concerned about the double dose on the warfarin (15 mg- total) and amiodarone (400 mg- total) last night. The patient had HD this morning and has not taken any medications at this time. I reviewed the above with Dr. Lovena Le & Jinny Blossom- pharm D.  Orders received to have the patient take only 200 mg of amiodarone tonight, he is also instructed to hold his warfarin today and resume at normal dose and schedule tomorrow.   Mrs. Hehir verbalizes understanding.

## 2016-07-19 ENCOUNTER — Ambulatory Visit (INDEPENDENT_AMBULATORY_CARE_PROVIDER_SITE_OTHER): Payer: Medicare Other | Admitting: *Deleted

## 2016-07-19 DIAGNOSIS — Z7901 Long term (current) use of anticoagulants: Secondary | ICD-10-CM

## 2016-07-19 DIAGNOSIS — I4892 Unspecified atrial flutter: Secondary | ICD-10-CM

## 2016-07-19 DIAGNOSIS — Z9861 Coronary angioplasty status: Secondary | ICD-10-CM

## 2016-07-19 DIAGNOSIS — Z5181 Encounter for therapeutic drug level monitoring: Secondary | ICD-10-CM

## 2016-07-19 DIAGNOSIS — I2510 Atherosclerotic heart disease of native coronary artery without angina pectoris: Secondary | ICD-10-CM

## 2016-07-19 DIAGNOSIS — I214 Non-ST elevation (NSTEMI) myocardial infarction: Secondary | ICD-10-CM

## 2016-07-19 DIAGNOSIS — I4891 Unspecified atrial fibrillation: Secondary | ICD-10-CM

## 2016-07-19 DIAGNOSIS — E875 Hyperkalemia: Secondary | ICD-10-CM | POA: Diagnosis not present

## 2016-07-19 DIAGNOSIS — I483 Typical atrial flutter: Secondary | ICD-10-CM

## 2016-07-19 DIAGNOSIS — D509 Iron deficiency anemia, unspecified: Secondary | ICD-10-CM | POA: Diagnosis not present

## 2016-07-19 DIAGNOSIS — N186 End stage renal disease: Secondary | ICD-10-CM | POA: Diagnosis not present

## 2016-07-19 DIAGNOSIS — Z23 Encounter for immunization: Secondary | ICD-10-CM | POA: Diagnosis not present

## 2016-07-19 LAB — PROTIME-INR
INR: 4.57 — AB
Prothrombin Time: 44.5 seconds — ABNORMAL HIGH (ref 11.4–15.2)

## 2016-07-19 LAB — POCT INR: INR: 6.5

## 2016-07-21 DIAGNOSIS — E875 Hyperkalemia: Secondary | ICD-10-CM | POA: Diagnosis not present

## 2016-07-21 DIAGNOSIS — Z23 Encounter for immunization: Secondary | ICD-10-CM | POA: Diagnosis not present

## 2016-07-21 DIAGNOSIS — D509 Iron deficiency anemia, unspecified: Secondary | ICD-10-CM | POA: Diagnosis not present

## 2016-07-21 DIAGNOSIS — N186 End stage renal disease: Secondary | ICD-10-CM | POA: Diagnosis not present

## 2016-07-23 DIAGNOSIS — Z23 Encounter for immunization: Secondary | ICD-10-CM | POA: Diagnosis not present

## 2016-07-23 DIAGNOSIS — N186 End stage renal disease: Secondary | ICD-10-CM | POA: Diagnosis not present

## 2016-07-23 DIAGNOSIS — E875 Hyperkalemia: Secondary | ICD-10-CM | POA: Diagnosis not present

## 2016-07-23 DIAGNOSIS — D509 Iron deficiency anemia, unspecified: Secondary | ICD-10-CM | POA: Diagnosis not present

## 2016-07-26 DIAGNOSIS — Z23 Encounter for immunization: Secondary | ICD-10-CM | POA: Diagnosis not present

## 2016-07-26 DIAGNOSIS — N186 End stage renal disease: Secondary | ICD-10-CM | POA: Diagnosis not present

## 2016-07-26 DIAGNOSIS — D509 Iron deficiency anemia, unspecified: Secondary | ICD-10-CM | POA: Diagnosis not present

## 2016-07-26 DIAGNOSIS — E875 Hyperkalemia: Secondary | ICD-10-CM | POA: Diagnosis not present

## 2016-07-28 ENCOUNTER — Ambulatory Visit (INDEPENDENT_AMBULATORY_CARE_PROVIDER_SITE_OTHER): Payer: Medicare Other | Admitting: Pharmacist

## 2016-07-28 DIAGNOSIS — I4891 Unspecified atrial fibrillation: Secondary | ICD-10-CM | POA: Diagnosis not present

## 2016-07-28 DIAGNOSIS — D509 Iron deficiency anemia, unspecified: Secondary | ICD-10-CM | POA: Diagnosis not present

## 2016-07-28 DIAGNOSIS — Z5181 Encounter for therapeutic drug level monitoring: Secondary | ICD-10-CM | POA: Diagnosis not present

## 2016-07-28 DIAGNOSIS — Z9861 Coronary angioplasty status: Secondary | ICD-10-CM

## 2016-07-28 DIAGNOSIS — Z7901 Long term (current) use of anticoagulants: Secondary | ICD-10-CM

## 2016-07-28 DIAGNOSIS — E875 Hyperkalemia: Secondary | ICD-10-CM | POA: Diagnosis not present

## 2016-07-28 DIAGNOSIS — N186 End stage renal disease: Secondary | ICD-10-CM | POA: Diagnosis not present

## 2016-07-28 DIAGNOSIS — I4892 Unspecified atrial flutter: Secondary | ICD-10-CM | POA: Diagnosis not present

## 2016-07-28 DIAGNOSIS — I214 Non-ST elevation (NSTEMI) myocardial infarction: Secondary | ICD-10-CM | POA: Diagnosis not present

## 2016-07-28 DIAGNOSIS — Z23 Encounter for immunization: Secondary | ICD-10-CM | POA: Diagnosis not present

## 2016-07-28 DIAGNOSIS — I2510 Atherosclerotic heart disease of native coronary artery without angina pectoris: Secondary | ICD-10-CM

## 2016-07-28 DIAGNOSIS — I483 Typical atrial flutter: Secondary | ICD-10-CM

## 2016-07-28 LAB — POCT INR: INR: 3.4

## 2016-07-30 DIAGNOSIS — E875 Hyperkalemia: Secondary | ICD-10-CM | POA: Diagnosis not present

## 2016-07-30 DIAGNOSIS — Z23 Encounter for immunization: Secondary | ICD-10-CM | POA: Diagnosis not present

## 2016-07-30 DIAGNOSIS — N186 End stage renal disease: Secondary | ICD-10-CM | POA: Diagnosis not present

## 2016-07-30 DIAGNOSIS — D509 Iron deficiency anemia, unspecified: Secondary | ICD-10-CM | POA: Diagnosis not present

## 2016-08-02 DIAGNOSIS — D509 Iron deficiency anemia, unspecified: Secondary | ICD-10-CM | POA: Diagnosis not present

## 2016-08-02 DIAGNOSIS — N186 End stage renal disease: Secondary | ICD-10-CM | POA: Diagnosis not present

## 2016-08-02 DIAGNOSIS — E875 Hyperkalemia: Secondary | ICD-10-CM | POA: Diagnosis not present

## 2016-08-02 DIAGNOSIS — Z23 Encounter for immunization: Secondary | ICD-10-CM | POA: Diagnosis not present

## 2016-08-04 ENCOUNTER — Ambulatory Visit
Admission: RE | Admit: 2016-08-04 | Discharge: 2016-08-04 | Disposition: A | Discharge status: Home / Self Care | Payer: Medicare Other | Source: Ambulatory Visit | Attending: Physician Assistant | Admitting: Physician Assistant

## 2016-08-04 ENCOUNTER — Ambulatory Visit (INDEPENDENT_AMBULATORY_CARE_PROVIDER_SITE_OTHER): Payer: Medicare Other | Admitting: Physician Assistant

## 2016-08-04 ENCOUNTER — Encounter: Payer: Self-pay | Admitting: Physician Assistant

## 2016-08-04 VITALS — BP 110/48 | HR 60 | Ht 70.0 in | Wt 177.8 lb

## 2016-08-04 DIAGNOSIS — N186 End stage renal disease: Secondary | ICD-10-CM | POA: Diagnosis not present

## 2016-08-04 DIAGNOSIS — Z23 Encounter for immunization: Secondary | ICD-10-CM | POA: Diagnosis not present

## 2016-08-04 DIAGNOSIS — I2510 Atherosclerotic heart disease of native coronary artery without angina pectoris: Secondary | ICD-10-CM

## 2016-08-04 DIAGNOSIS — Z9861 Coronary angioplasty status: Secondary | ICD-10-CM

## 2016-08-04 DIAGNOSIS — R0902 Hypoxemia: Secondary | ICD-10-CM

## 2016-08-04 DIAGNOSIS — I452 Bifascicular block: Secondary | ICD-10-CM

## 2016-08-04 DIAGNOSIS — I5032 Chronic diastolic (congestive) heart failure: Secondary | ICD-10-CM | POA: Diagnosis not present

## 2016-08-04 DIAGNOSIS — I480 Paroxysmal atrial fibrillation: Secondary | ICD-10-CM | POA: Diagnosis not present

## 2016-08-04 DIAGNOSIS — R0602 Shortness of breath: Secondary | ICD-10-CM

## 2016-08-04 DIAGNOSIS — I4892 Unspecified atrial flutter: Secondary | ICD-10-CM

## 2016-08-04 DIAGNOSIS — D509 Iron deficiency anemia, unspecified: Secondary | ICD-10-CM | POA: Diagnosis not present

## 2016-08-04 DIAGNOSIS — R05 Cough: Secondary | ICD-10-CM | POA: Diagnosis not present

## 2016-08-04 DIAGNOSIS — E875 Hyperkalemia: Secondary | ICD-10-CM | POA: Diagnosis not present

## 2016-08-04 LAB — CBC
HCT: 37.1 % — ABNORMAL LOW (ref 38.5–50.0)
HEMOGLOBIN: 12 g/dL — AB (ref 13.2–17.1)
MCH: 32.5 pg (ref 27.0–33.0)
MCHC: 32.3 g/dL (ref 32.0–36.0)
MCV: 100.5 fL — ABNORMAL HIGH (ref 80.0–100.0)
MPV: 9.7 fL (ref 7.5–12.5)
PLATELETS: 140 10*3/uL (ref 140–400)
RBC: 3.69 MIL/uL — ABNORMAL LOW (ref 4.20–5.80)
RDW: 15.8 % — AB (ref 11.0–15.0)
WBC: 6.4 10*3/uL (ref 3.8–10.8)

## 2016-08-04 MED ORDER — AMIODARONE HCL 200 MG PO TABS: ORAL_TABLET | ORAL | 3 refills | Status: DC

## 2016-08-04 NOTE — Progress Notes (Signed)
Cardiology Office Note    Date:  08/04/2016  ID:  [name], DOB 07/25/1956, MRN [id number] PCP:  Teressa Lower, MD  Cardiologist:  Angelena Form   Chief Complaint: post-hospital f/u  History of Present Illness:  [name] is a 60 y.o. 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  To recap history, nuc 11/2015 in prep for repeat transplant eval was low risk at [facility]. In 01/2016, he was cleared to stop Plavix but remained on ASA + Coumadin. (Per notes, it was felt by the transplant team that he would need to be off Plavix to be considered for transplantation.) He was admitted 02/16/16 with midsternal chest pain with radiation to his jaw/teeth and anterior STEMI. LHC 02/15/16: acute occlusion of SVG-LAD at anastamosis, patent LCx with moderate restenosis, moderate RCA disease, patent Y graft to diagonal with moderate ostial stenosis, s/p angioplasty/DES to SVG-LAD extending into the native vessel - this was a difficult procedure c/b large right groin hematoma and ABL anemia. His post-hospital course notable for NSVT, prominent bifascicular block on EKG post-cath, ABL anemia, nocturnal hypoxia (known for patient), and episodic hypotension. Coumadin was held for several days and actually reversed due to supratherapeutic INR and  progressive anemia. Hgb was 11.7 on admission and drifted down to a nadir of 7.9. He received 3 u PRBC total. CT abd pelvis ruled out retroperitoneal hemorrhage. Groin [place] was without pseudoaneurysm. Coumadin was resumed once Hgb was stable. The plan was outlined for triple therapy initially and stopping aspirin 2 weeks after PCI, with plans to revisit long-term decision regarding Plavix as outpatient - remaining on Plavix would exclude him from a kidney transplant, but concern is that he developed STEMI only a week after discontinuation of Plavix. He was back in afib in followup in 02/2016 but then in sinus when returning for follow-up wiht pharmD a week later. 2D Echo 03/25/16: mild LVH, EF 50-55%, grade 2 Dd, AVR present with mild AI, mod MR, severely dilated LA, mildly dilated RV, mod RAE, PASP 72.  More recently he was admitted 11/3-11/7 with SOB and return to atrial fib/flutter. He was placed on amiodarone and ultimately underwent DCCV on 07/12/16 with return to NSR. It was recommended he continue amiodarone '400mg'$  BID x 2 weeks, then '200mg'$  BID x 2 weeks, then '200mg'$  daily. He also went home requiring home oxygen at rest as well as with ambulation and at night.Marland Kitchen Hospital course otherwise notable for hypotension during most of his stay which improved post-DCCV. Labs notable for normal TSH, Hgb 11.2, LFTs OK.  He returns for follow-up today with his wife. Overall he is feeling better. However, over the last week he has noticed some return of dyspnea with exertion. He is not wearing his O2 today. He states he only wears it at home at rest and at night but has not been wearing it with exertion as previously recommended.  HR has remained controlled. He also wonders if he cracked a rib - he leaned far over a chair the other day and since that time has noticed some tenderness to the area with palpation and movement. He has not had any chest pain. No LEE. Weight and BP are stable.   Past Medical History:  Diagnosis  Date  . Acute blood loss anemia    a. 02/2016 due to groin hematoma. Rec 3 U PRBC.  Marland Kitchen Anemia   . Aortic heart valve prolapse 04/2013   a. s/p bioprosthetic AVR at time of CABG.  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 Bifascicular block   . CAD (coronary artery disease) 04/2013   a. 04/2013: s/p CABG x 2 (Y SVG -LAD & D2). b. 01/2015: NSTEMI s/p DES to LCx, BMS to SVG-LAD. c. NSTEMI 05/2015 during AF/AFL - non-flow limiting FFR. d. Low risk nuc 3/17. e. STEMI 6/17 after coming off Plavix, s/p DES to SVG-LAD into native vessel.  . Carotid artery disease ([place])    a. 1-39% stenosis bilaterally in 06/2015.   Marland Kitchen Chronic respiratory failure ([place])   . 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 b. He had a renal Tx from 2008 to 2011, but subsequent rejection - Gets HD in [place], [place] on MWF schedule.    . Hematoma    a. Large right groin hematoma after cath 02/2016 with associated ABL anemia.  . Hypertension   . Multiple sclerosis ([place])   . Nocturnal hypoxemia   . On home oxygen therapy    pt states he has not been wearing it  . PAF (paroxysmal atrial fibrillation) ([place])    a. h/o, placed on amiodarone 07/2016 due to recurrence.  . Paroxysmal atrial flutter ([place])    a. During 05/2015 admission - SVT, atrial flutter, and PAF.  Marland Kitchen Peripheral vascular disease ([place])    Cath in 01/2015 required 45 cm [place]  . Renal transplant failure and rejection   . 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 intolerant of bipap.  Marland Kitchen SVT (supraventricular tachycardia) ([place])   . Valvular heart disease    a. 2D Echo 03/25/16: mild LVH, EF 50-55%, grade 2 Dd, AVR present with mild AI, mod MR, severely dilated LA, mildly dilated RV, mod RAE, PASP 72.    Past Surgical History:  Procedure Laterality Date  . AV FISTULA PLACEMENT    . CARDIAC CATHETERIZATION N/A 05/26/2015   Procedure: Left Heart Cath and Coronary  Angiography;  Surgeon: Leonie Man, MD;  Location: [place] [facility];  Service: Cardiovascular;  Laterality: N/A;  . CARDIAC CATHETERIZATION N/A 05/26/2015   Procedure: Intravascular Pressure Wire/FFR Study;  Surgeon: Leonie Man, MD;  Location: [place] [facility];  Service: Cardiovascular;  Laterality: N/A;  . CARDIAC CATHETERIZATION N/A 02/15/2016   Procedure: Left Heart Cath and Coronary Angiography;  Surgeon: Wellington Hampshire, MD;  Location: [place] [facility];  Service: Cardiovascular;  Laterality: N/A;  . CARDIOVERSION N/A 07/12/2016   Procedure: CARDIOVERSION;  Surgeon: Minus Breeding, MD;  Location: [facility];  Service: Cardiovascular;  Laterality: N/A;  . CORONARY ARTERY BYPASS GRAFT    . excised squamous cells at rectum  2006  . flash     flash pulmonary edema  . HERNIA REPAIR  07/2011  . KIDNEY TRANSPLANT  08/2007  .  LEFT HEART CATHETERIZATION WITH CORONARY ANGIOGRAM N/A 01/09/2013   Procedure: LEFT HEART CATHETERIZATION WITH CORONARY ANGIOGRAM;  Surgeon: Burnell Blanks, MD;  Location: [facility] CATH LAB;  Service: Cardiovascular;  Laterality: N/A;    Current Medications: Current Outpatient Prescriptions  Medication Sig Dispense Refill  . acetaminophen (TYLENOL) 500 MG tablet Take 1,000 mg by mouth every 6 (six) hours as needed for pain or fever.    Marland Kitchen albuterol (PROAIR HFA) 108 (90 BASE) MCG/ACT inhaler Inhale 1-2 puffs into the lungs every 6 (six) hours as needed for shortness of breath.     Marland Kitchen atorvastatin (LIPITOR) 40 MG tablet Take 40 mg by mouth daily.     . calcitRIOL (ROCALTROL) 0.25 MCG capsule Take 1 capsule (0.25 mcg total) by mouth daily. 30 capsule 0  . calcium acetate (PHOSLO) 667 MG capsule Take 4,002 mg by mouth 3 (three) times daily with meals.     . clopidogrel (PLAVIX) 75 MG tablet Take 1 tablet (75 mg total) by mouth daily. 90 tablet 3  . docusate sodium (COLACE) 100 MG capsule Take 300 mg by mouth 2 (two) times daily.    . folic acid (FOLVITE) 1 MG  tablet Take 400 mcg by mouth daily.     Marland Kitchen gabapentin (NEURONTIN) 300 MG capsule Take 300 mg by mouth at bedtime.     . metoprolol tartrate (LOPRESSOR) 25 MG tablet Take 0.5 tablets (12.5 mg total) by mouth 2 (two) times daily. 180 tablet 3  . midodrine (PROAMATINE) 10 MG tablet Take 1 tablet (10 mg total) by mouth every Monday, Wednesday, and Friday with hemodialysis.    Marland Kitchen multivitamin (RENA-VIT) TABS tablet Take 1 tablet by mouth daily. 30 tablet 1  . nitroGLYCERIN (NITROSTAT) 0.4 MG SL tablet Place 0.4 mg under the tongue every 5 (five) minutes as needed for chest pain (Take one tabe every 5-10 minutes for chest pain. Once you have taken three tabs you need to notify your MD or go to ER if chest pain not gone.).    Marland Kitchen ranitidine (ZANTAC) 150 MG tablet Take 150 mg by mouth daily.    . sodium polystyrene (KAYEXALATE) 15 GM/60ML suspension Take 15 g by mouth See admin instructions. Only take 15 g on Sun / Tues / Thurs / Sat (non dialysis days)    . warfarin (COUMADIN) 7.5 MG tablet Take 3.75-7.5 mg by mouth as directed. Takes 1/2 tab on Tues, Sat 3.'75mg'$  Takes 1 tab all other days     No current facility-administered medications for this visit.      Allergies:   Diphenhydramine; Penicillins; Adhesive [tape]; Amoxicillin; and Other   Social History   Social History  . Marital status: Married    Spouse name: N/A  . Number of children: N/A  . Years of education: N/A   Occupational History  . Disabled    Social History Main Topics  . Smoking status: Former Smoker    Packs/day: 2.00    Years: 20.00    Types: Cigarettes    Quit date: 08/06/1998  . Smokeless tobacco: Never Used  . Alcohol use No  . Drug use: No  . Sexual activity: Not Asked   Other Topics Concern  . None   Social History Narrative   No history of premature CAD in either parents or siblings. However, his maternal grandfather and 2 maternal uncles had coronary artery disease.     Family History:  The patient's family  history includes Diabetes in his father; Heart attack in his  maternal aunt, maternal grandfather, and maternal uncle; Heart failure in his mother; Hypertension in his maternal aunt; Lupus in his sister.   ROS:   Please see the history of present illness.   All other systems are reviewed and otherwise negative.    PHYSICAL EXAM:   VS:  BP (!) 110/48   Pulse 60   Ht '5\' 10"'$  (1.778 m)   Wt 177 lb 12 oz (80.6 kg)   SpO2 94%   BMI 25.50 kg/m   BMI: Body mass index is 25.5 kg/m. GEN: Chronically ill appearing WM in no acute distress  HEENT: normocephalic, atraumatic Neck: no JVD, carotid bruits, or masses Cardiac: RRR; systolic murmur noted LSB with ebb/flow pattern -- I asked Dr. Marlou Porch to assess who feels this is stable and likely physiologic from valve replacement. No rubs or gallops, no edema  Respiratory:  Diminished BS throughout, no wheezes, rales or rhonchi, normal work of breathing GI: soft, nontender, nondistended, + BS MS: no deformity or atrophy, no acute injury noted Skin: warm and dry, no rash Neuro:  Alert and Oriented x 3, Strength and sensation are intact, follows commands Psych: euthymic mood, full affect  Wt Readings from Last 3 Encounters:  08/04/16 177 lb 12 oz (80.6 kg)  07/13/16 182 lb 8.7 oz (82.8 kg)  03/31/16 182 lb 12.8 oz (82.9 kg)      Studies/Labs Reviewed:   EKG:  EKG was ordered today and personally reviewed by me and demonstrates sinus bradycardia 57bpm with 1st degree AVB, RBBB  Recent Labs: 07/09/2016: ALT 26; B Natriuretic Peptide 2,414.3 07/11/2016: Magnesium 2.2 07/12/2016: Hemoglobin 11.2; Platelets 147 07/13/2016: BUN 28; Creatinine, Ser 5.27; Potassium 4.1; Sodium 137; TSH 1.800   Lipid Panel    Component Value Date/Time   CHOL 98 (L) 03/05/2016 1125   TRIG 143 03/05/2016 1125   HDL 32 (L) 03/05/2016 1125   CHOLHDL 3.1 03/05/2016 1125   VLDL 29 03/05/2016 1125   LDLCALC 37 03/05/2016 1125    Additional studies/ records that were  reviewed today include: Summarized above.    ASSESSMENT & PLAN:   1. Shortness of breath - no acute pathology noted on exam from cardiac perspective. Appears euvolemic and maintaining NSR. He wonders if he broke a rib the other day. I will check a 2V CXR and CBC to make sure these are stable. Ultimately this may be due to his recent progression to chronic respiratory failure with desaturations upon ambulation. I advised that he wear his oxygen as previously recommended. I also think given our use of amiodarone in this patient with COPD and hypoxia, he should also establish care with a pulmonologist. Will refer.  2. Paroxysmal atrial fib/flutter - maintaining NSR on amiodarone. He does not have any more tablets available. Will refill - per prior outlined treatment plan he will continue 1 tablet ('200mg'$ ) BID through 12/4 then reduce to 1 tablet daily from 08/10/16 onward. INR followed in Coumadin clinic. Refer to pulm as above. He inquired about his citalopram - was informed by his PCP's office that he should stop this due to interaction with amiodarone. I asked that he call his PCP to discuss alternatives to citalopram as SSRIs are not typically abruptly stopped. 3. CAD - he is not describing any chest pain. Continue Plavix, BB, statin. Will need to f/u Dr. Angelena Form as previously recommended in 09/2016 to review plan for Plavix (see HPI). 4. Bifascicular block - stable, no evidence of bradycardia. Monitor closely. 5. Chronic diastolic CHF -  volume managed with dialysis. Does not appear volume overloaded. 6. ESRD on HD - managed by nephrology. He inquired about refill of calcitriol as it was on his hospital list but they do not have this at home. I advised he contact his nephrology to further discuss. 7. H/o AVR - stable by echo in 03/2016. As above I also had Dr. Marlou Porch auscultate the patient today.  Disposition: F/u with Dr. Angelena Form in 09/2016.   Medication Adjustments/Labs and Tests Ordered: Current  medicines are reviewed at length with the patient today.  Concerns regarding medicines are outlined above. Medication changes, Labs and Tests ordered today are summarized above and listed in the Patient Instructions accessible in Encounters.   Raechel Ache PA-C  08/04/2016 1:48 PM    [facility] [address], [place], [place]  [address] Phone: [phone]; Fax: [phone]

## 2016-08-04 NOTE — Patient Instructions (Addendum)
Medication Instructions:  Your physician has recommended you make the following change in your medication:  1.  STARTING 08/10/16 start taking Amiodarone 200 mg 1 tablet daily  Labwork: TODAY:  CBC  Testing/Procedures: A chest x-ray takes a picture of the organs and structures inside the chest, including the heart, lungs, and blood vessels. This test can show several things, including, whether the heart is enlarges; whether fluid is building up in the lungs; and whether pacemaker / defibrillator leads are still in place.   Follow-Up: Your physician recommends that you schedule a follow-up appointment in: 3 MONTHS WITH DR. Angelena Form   You have been referred to a Pulmonologist.  Their office will contact you with that appointment.  If you haven't heard anything in 1-2 weeks, please call our office.   Any Other Special Instructions Will Be Listed Below (If Applicable). 1.  Wear your Oxygen as instructed 2.  Please discuss the Calcitrol with your Nephrologist 3.  Please discuss the Citalopram (Celexa) with your PCP about an alternative medication   If you need a refill on your cardiac medications before your next appointment, please call your pharmacy.

## 2016-08-05 DIAGNOSIS — Z992 Dependence on renal dialysis: Secondary | ICD-10-CM | POA: Diagnosis not present

## 2016-08-05 DIAGNOSIS — T8612 Kidney transplant failure: Secondary | ICD-10-CM | POA: Diagnosis not present

## 2016-08-05 DIAGNOSIS — N186 End stage renal disease: Secondary | ICD-10-CM | POA: Diagnosis not present

## 2016-08-06 DIAGNOSIS — N186 End stage renal disease: Secondary | ICD-10-CM | POA: Diagnosis not present

## 2016-08-06 DIAGNOSIS — D509 Iron deficiency anemia, unspecified: Secondary | ICD-10-CM | POA: Diagnosis not present

## 2016-08-06 DIAGNOSIS — E875 Hyperkalemia: Secondary | ICD-10-CM | POA: Diagnosis not present

## 2016-08-09 DIAGNOSIS — E875 Hyperkalemia: Secondary | ICD-10-CM | POA: Diagnosis not present

## 2016-08-09 DIAGNOSIS — D509 Iron deficiency anemia, unspecified: Secondary | ICD-10-CM | POA: Diagnosis not present

## 2016-08-09 DIAGNOSIS — N186 End stage renal disease: Secondary | ICD-10-CM | POA: Diagnosis not present

## 2016-08-11 DIAGNOSIS — N186 End stage renal disease: Secondary | ICD-10-CM | POA: Diagnosis not present

## 2016-08-11 DIAGNOSIS — D509 Iron deficiency anemia, unspecified: Secondary | ICD-10-CM | POA: Diagnosis not present

## 2016-08-11 DIAGNOSIS — E875 Hyperkalemia: Secondary | ICD-10-CM | POA: Diagnosis not present

## 2016-08-13 DIAGNOSIS — N186 End stage renal disease: Secondary | ICD-10-CM | POA: Diagnosis not present

## 2016-08-13 DIAGNOSIS — D509 Iron deficiency anemia, unspecified: Secondary | ICD-10-CM | POA: Diagnosis not present

## 2016-08-13 DIAGNOSIS — J439 Emphysema, unspecified: Secondary | ICD-10-CM | POA: Diagnosis not present

## 2016-08-13 DIAGNOSIS — I4891 Unspecified atrial fibrillation: Secondary | ICD-10-CM | POA: Diagnosis not present

## 2016-08-13 DIAGNOSIS — E875 Hyperkalemia: Secondary | ICD-10-CM | POA: Diagnosis not present

## 2016-08-16 DIAGNOSIS — D509 Iron deficiency anemia, unspecified: Secondary | ICD-10-CM | POA: Diagnosis not present

## 2016-08-16 DIAGNOSIS — E875 Hyperkalemia: Secondary | ICD-10-CM | POA: Diagnosis not present

## 2016-08-16 DIAGNOSIS — N186 End stage renal disease: Secondary | ICD-10-CM | POA: Diagnosis not present

## 2016-08-17 ENCOUNTER — Telehealth: Payer: Self-pay | Admitting: Cardiology

## 2016-08-17 ENCOUNTER — Ambulatory Visit (INDEPENDENT_AMBULATORY_CARE_PROVIDER_SITE_OTHER): Payer: Medicare Other | Admitting: *Deleted

## 2016-08-17 DIAGNOSIS — I214 Non-ST elevation (NSTEMI) myocardial infarction: Secondary | ICD-10-CM | POA: Diagnosis not present

## 2016-08-17 DIAGNOSIS — I2510 Atherosclerotic heart disease of native coronary artery without angina pectoris: Secondary | ICD-10-CM | POA: Diagnosis not present

## 2016-08-17 DIAGNOSIS — I4891 Unspecified atrial fibrillation: Secondary | ICD-10-CM

## 2016-08-17 DIAGNOSIS — I483 Typical atrial flutter: Secondary | ICD-10-CM

## 2016-08-17 DIAGNOSIS — Z9861 Coronary angioplasty status: Secondary | ICD-10-CM | POA: Diagnosis not present

## 2016-08-17 DIAGNOSIS — Z5181 Encounter for therapeutic drug level monitoring: Secondary | ICD-10-CM

## 2016-08-17 DIAGNOSIS — I4892 Unspecified atrial flutter: Secondary | ICD-10-CM | POA: Diagnosis not present

## 2016-08-17 DIAGNOSIS — Z7901 Long term (current) use of anticoagulants: Secondary | ICD-10-CM

## 2016-08-17 LAB — PROTIME-INR
INR: 4.27 — AB
PROTHROMBIN TIME: 42.2 s — AB (ref 11.4–15.2)

## 2016-08-17 LAB — POCT INR: INR: 6.5

## 2016-08-17 NOTE — Telephone Encounter (Signed)
S/w Candance in [place] who states she will take care of pt's INR. 12/12.Marland Kitchencmf

## 2016-08-17 NOTE — Telephone Encounter (Signed)
New Message:    [facility] have a critical lab results. I even called Freeman Caldron was in with a patient.P

## 2016-08-18 DIAGNOSIS — D509 Iron deficiency anemia, unspecified: Secondary | ICD-10-CM | POA: Diagnosis not present

## 2016-08-18 DIAGNOSIS — N186 End stage renal disease: Secondary | ICD-10-CM | POA: Diagnosis not present

## 2016-08-18 DIAGNOSIS — E875 Hyperkalemia: Secondary | ICD-10-CM | POA: Diagnosis not present

## 2016-08-20 DIAGNOSIS — E875 Hyperkalemia: Secondary | ICD-10-CM | POA: Diagnosis not present

## 2016-08-20 DIAGNOSIS — D509 Iron deficiency anemia, unspecified: Secondary | ICD-10-CM | POA: Diagnosis not present

## 2016-08-20 DIAGNOSIS — N186 End stage renal disease: Secondary | ICD-10-CM | POA: Diagnosis not present

## 2016-08-23 DIAGNOSIS — E875 Hyperkalemia: Secondary | ICD-10-CM | POA: Diagnosis not present

## 2016-08-23 DIAGNOSIS — D509 Iron deficiency anemia, unspecified: Secondary | ICD-10-CM | POA: Diagnosis not present

## 2016-08-23 DIAGNOSIS — N186 End stage renal disease: Secondary | ICD-10-CM | POA: Diagnosis not present

## 2016-08-24 ENCOUNTER — Ambulatory Visit (INDEPENDENT_AMBULATORY_CARE_PROVIDER_SITE_OTHER): Payer: Medicare Other | Admitting: Internal Medicine

## 2016-08-24 ENCOUNTER — Encounter: Payer: Self-pay | Admitting: Internal Medicine

## 2016-08-24 VITALS — BP 106/60 | HR 58 | Ht 70.0 in | Wt 184.4 lb

## 2016-08-24 DIAGNOSIS — Z9861 Coronary angioplasty status: Secondary | ICD-10-CM

## 2016-08-24 DIAGNOSIS — R0602 Shortness of breath: Secondary | ICD-10-CM

## 2016-08-24 DIAGNOSIS — I5020 Unspecified systolic (congestive) heart failure: Secondary | ICD-10-CM

## 2016-08-24 DIAGNOSIS — I2510 Atherosclerotic heart disease of native coronary artery without angina pectoris: Secondary | ICD-10-CM | POA: Diagnosis not present

## 2016-08-24 DIAGNOSIS — J449 Chronic obstructive pulmonary disease, unspecified: Secondary | ICD-10-CM

## 2016-08-24 MED ORDER — UMECLIDINIUM-VILANTEROL 62.5-25 MCG/INH IN AEPB: 1.0000 | INHALATION_SPRAY | Freq: Every day | RESPIRATORY_TRACT | 0 refills | Status: DC

## 2016-08-24 NOTE — Patient Instructions (Addendum)
Try anoro one click each am take two deep drags to see if helps   Please schedule a follow up office visit in 4 weeks, sooner if needed with full pfts on return

## 2016-08-24 NOTE — Progress Notes (Signed)
Subjective:    Patient ID: [name], male    DOB: [birth year],    MRN: [id number]  HPI  22 yowm with multiple sclerosis quit smoking 1999 with new doe x 2015 and using 02 during the day x 07/2016 and referred to pulmonary clinic 08/24/2016 by Dr Julianne Handler    08/24/2016 1st [facility] office visit/ Wlliam Grosso   Chief Complaint  Patient presents with  . Pulmonary Consult    Referred by Foy Guadalajara, PA for eval of COPD.  Pt c/o SOB since 2016. He gets winded walking to the mailbox and back and walking up an incline.    sleeps in recliner since 2008 transplant Breathing always worse the day before HD [facility] = can't walk 100 yards even at a slow pace at a flat grade s stopping due to sob   eg Can do HT but not [employer]  Some nasal congestion but no chest congestion/ no cough except on Sunday evenings   No obvious other patterns in  day to day or daytime variability or assoc excess/ purulent sputum or mucus plugs or hemoptysis or cp or chest tightness, subjective wheeze or overt   hb symptoms. No unusual exp hx or h/o childhood pna/ asthma or knowledge of premature birth.  Sleeping ok  At 30 degrees without nocturnal  or early am exacerbation  of respiratory  c/o's or need for noct saba. Also denies any obvious fluctuation of symptoms with weather or environmental changes or other aggravating or alleviating factors except as outlined above   Current Medications, Allergies, Complete Past Medical History, Past Surgical History, Family History, and Social History were reviewed in [employer] record.      Review of Systems  Constitutional: Negative for activity change, appetite change, chills, fever and unexpected weight change.  HENT: Positive for congestion. Negative for dental problem, postnasal drip, rhinorrhea, sneezing, sore throat, trouble swallowing and voice change.   Eyes: Negative for visual disturbance.  Respiratory: Positive for shortness of breath.  Negative for cough and choking.   Cardiovascular: Negative for chest pain and leg swelling.  Gastrointestinal: Negative for abdominal pain, nausea and vomiting.  Genitourinary: Negative for difficulty urinating.  Musculoskeletal: Negative for arthralgias.  Skin: Negative for rash.  Psychiatric/Behavioral: Negative for behavioral problems and confusion.       Objective:   Physical Exam  amb chronically ill appearing wm nad struggled to get up on table due awkward gait/ balance issues / weak legs, not sob   Wt Readings from Last 3 Encounters:  08/24/16 184 lb 6.4 oz (83.6 kg)  08/04/16 177 lb 12 oz (80.6 kg)  07/13/16 182 lb 8.7 oz (82.8 kg)    Vital signs reviewed - Note on arrival 02 sats  97% on RA    HEENT: poor dentition, nl turbinates, and oropharynx. Nl external ear canals without cough reflex   NECK :  without JVD/Nodes/TM/ nl carotid upstrokes bilaterally   LUNGS: no acc muscle use,  slt barrel contour chest with distant bs bilaterally without cough on insp or exp maneuvers   CV:  RRR  no s3 or murmur or increase in P2,  Trace pitting edema L > R   ABD:  soft and nontender with  End insp hoover's in the supine position. No bruits or organomegaly appreciated, bowel sounds nl  MS:  Awkward  gait/ ext warm without deformities, calf tenderness, cyanosis or clubbing No obvious joint restrictions   SKIN: warm and dry without lesions  NEURO:  alert, approp, nl sensorium with  no motor or cerebellar deficits apparent.     I personally reviewed images and agree with radiology impression as follows:  CXR:   08/04/16 Mild CHF superimposed upon COPD. The CHF has improved since the previous study. No pneumonia nor pneumothorax nor large pleural effusion. No definite rib abnormality.     Assessment & Plan:

## 2016-08-25 ENCOUNTER — Encounter: Payer: Self-pay | Admitting: Internal Medicine

## 2016-08-25 DIAGNOSIS — D509 Iron deficiency anemia, unspecified: Secondary | ICD-10-CM | POA: Diagnosis not present

## 2016-08-25 DIAGNOSIS — N186 End stage renal disease: Secondary | ICD-10-CM | POA: Diagnosis not present

## 2016-08-25 DIAGNOSIS — E875 Hyperkalemia: Secondary | ICD-10-CM | POA: Diagnosis not present

## 2016-08-25 NOTE — Assessment & Plan Note (Signed)
Placed on amiodarone 07/13/2016   He has enough chf/copd to explain his sob but now also on amiodarone.   Patients typically have been on amiodarone for 6-12 months before this complication manifests.  Of note, serial clinical evaluation for symptoms such as cough dyspnea or fevers is  the preferred method of monitoring for pulmonary toxicity because a decrease in DLCO or lung volumes is a nonspecific for toxicity. Pathologically amiodarone pulmonary toxicity may appear as interstitial pneumonitis, eosinophilic pneumonia, organizing pneumonia, pulmonary fibrosis or less commonly as diffuse alveolar hemorrhage, pulmonary nodules or pleural effusions.  Risk factors for pulmonary toxicity include age greater than 88, daily dose greater than equal to 400 mg, a high cumulative dose, or pre-existing lung disease    Will do pfts now and again at 6 months, sooner if breathing worsens at dry wt

## 2016-08-25 NOTE — Assessment & Plan Note (Signed)
Echo 03/25/16 Low normal LV systolic function; grade 2 diastolic dysfunction   with elevated LV filling pressure; biatrial enlargement; s/p AVR   with normal gradients and mild AI; severe MAC with moderate,   eccentric MR (may be underestimated; suggest TEE to further   assess if clinically indicated); mild RVE; mild TR with severely   elevated pulmonary pressure.  Clearly the variable part he describes with coughing only on Sunday evenings is chf/ vol overload/cardiac asthma and not copd exac pattern > rx per Cards/Renal

## 2016-08-25 NOTE — Assessment & Plan Note (Signed)
Spirometry 08/24/2016  FEV1 0.48 (22%)  Ratio 49 > trial of anoro one click each am     When respiratory symptoms begin or become refractory well after a patient reports complete smoking cessation,  Especially when this wasn't the case while they were smoking, a red flag is raised based on the work of Dr Kris Mouton which states:  if you quit smoking when your best day FEV1 is still well preserved it is highly unlikely you will progress to severe disease.  That is to say, once the smoking stops,  the symptoms should not suddenly erupt or markedly worsen.  If so, the differential diagnosis should include  obesity/deconditioning,  LPR/Reflux/Aspiration syndromes,  occult CHF, or  especially side effect of medications commonly used in this population.    I doubt he's really a GOLD IV and need full pfts to confirm it but more likely his recent worsening is chf/vol overload  For now rec trial of anoro one clickeach am   - The proper method of use, as well as anticipated side effects, of a dry powdered inhaler are discussed and demonstrated to the patient. Improved effectiveness after extensive coaching during this visit to a level of approximately 90 % from a baseline of 75 % > ok for dpi use  Total time devoted to counseling  > 50 % of a 60 min office visit:  review case with pt/ discussion of options/alternatives/ personally creating written customized instructions  in presence of pt  then going over those specific  Instructions directly with the pt including how to use all of the meds but in particular covering each new medication in detail and the difference between the maintenance/automatic meds and the prns using an action plan format for the latter.  Please see AVS from this visit for a full list of these instructions

## 2016-08-27 ENCOUNTER — Ambulatory Visit (INDEPENDENT_AMBULATORY_CARE_PROVIDER_SITE_OTHER): Payer: Medicare Other | Admitting: *Deleted

## 2016-08-27 DIAGNOSIS — I2510 Atherosclerotic heart disease of native coronary artery without angina pectoris: Secondary | ICD-10-CM

## 2016-08-27 DIAGNOSIS — I4892 Unspecified atrial flutter: Secondary | ICD-10-CM

## 2016-08-27 DIAGNOSIS — D509 Iron deficiency anemia, unspecified: Secondary | ICD-10-CM | POA: Diagnosis not present

## 2016-08-27 DIAGNOSIS — I4891 Unspecified atrial fibrillation: Secondary | ICD-10-CM | POA: Diagnosis not present

## 2016-08-27 DIAGNOSIS — I214 Non-ST elevation (NSTEMI) myocardial infarction: Secondary | ICD-10-CM | POA: Diagnosis not present

## 2016-08-27 DIAGNOSIS — N186 End stage renal disease: Secondary | ICD-10-CM | POA: Diagnosis not present

## 2016-08-27 DIAGNOSIS — E875 Hyperkalemia: Secondary | ICD-10-CM | POA: Diagnosis not present

## 2016-08-27 DIAGNOSIS — Z9861 Coronary angioplasty status: Secondary | ICD-10-CM

## 2016-08-27 DIAGNOSIS — Z5181 Encounter for therapeutic drug level monitoring: Secondary | ICD-10-CM

## 2016-08-27 DIAGNOSIS — I483 Typical atrial flutter: Secondary | ICD-10-CM

## 2016-08-27 DIAGNOSIS — Z7901 Long term (current) use of anticoagulants: Secondary | ICD-10-CM

## 2016-08-27 LAB — POCT INR: INR: 3.2

## 2016-08-29 DIAGNOSIS — D509 Iron deficiency anemia, unspecified: Secondary | ICD-10-CM | POA: Diagnosis not present

## 2016-08-29 DIAGNOSIS — E875 Hyperkalemia: Secondary | ICD-10-CM | POA: Diagnosis not present

## 2016-08-29 DIAGNOSIS — N186 End stage renal disease: Secondary | ICD-10-CM | POA: Diagnosis not present

## 2016-09-01 DIAGNOSIS — N186 End stage renal disease: Secondary | ICD-10-CM | POA: Diagnosis not present

## 2016-09-01 DIAGNOSIS — E875 Hyperkalemia: Secondary | ICD-10-CM | POA: Diagnosis not present

## 2016-09-01 DIAGNOSIS — D509 Iron deficiency anemia, unspecified: Secondary | ICD-10-CM | POA: Diagnosis not present

## 2016-09-03 DIAGNOSIS — E875 Hyperkalemia: Secondary | ICD-10-CM | POA: Diagnosis not present

## 2016-09-03 DIAGNOSIS — D509 Iron deficiency anemia, unspecified: Secondary | ICD-10-CM | POA: Diagnosis not present

## 2016-09-03 DIAGNOSIS — N186 End stage renal disease: Secondary | ICD-10-CM | POA: Diagnosis not present

## 2016-09-05 DIAGNOSIS — T8612 Kidney transplant failure: Secondary | ICD-10-CM | POA: Diagnosis not present

## 2016-09-05 DIAGNOSIS — E875 Hyperkalemia: Secondary | ICD-10-CM | POA: Diagnosis not present

## 2016-09-05 DIAGNOSIS — N186 End stage renal disease: Secondary | ICD-10-CM | POA: Diagnosis not present

## 2016-09-05 DIAGNOSIS — Z992 Dependence on renal dialysis: Secondary | ICD-10-CM | POA: Diagnosis not present

## 2016-09-05 DIAGNOSIS — D509 Iron deficiency anemia, unspecified: Secondary | ICD-10-CM | POA: Diagnosis not present

## 2016-09-08 DIAGNOSIS — N186 End stage renal disease: Secondary | ICD-10-CM | POA: Diagnosis not present

## 2016-09-08 DIAGNOSIS — D509 Iron deficiency anemia, unspecified: Secondary | ICD-10-CM | POA: Diagnosis not present

## 2016-09-08 DIAGNOSIS — E875 Hyperkalemia: Secondary | ICD-10-CM | POA: Diagnosis not present

## 2016-09-08 DIAGNOSIS — N2581 Secondary hyperparathyroidism of renal origin: Secondary | ICD-10-CM | POA: Diagnosis not present

## 2016-09-10 ENCOUNTER — Telehealth: Payer: Self-pay | Admitting: Cardiovascular Disease

## 2016-09-10 ENCOUNTER — Ambulatory Visit (INDEPENDENT_AMBULATORY_CARE_PROVIDER_SITE_OTHER): Payer: Medicare Other | Admitting: *Deleted

## 2016-09-10 ENCOUNTER — Encounter: Payer: Self-pay | Admitting: Cardiology

## 2016-09-10 ENCOUNTER — Ambulatory Visit (INDEPENDENT_AMBULATORY_CARE_PROVIDER_SITE_OTHER): Payer: Medicare Other | Admitting: Cardiology

## 2016-09-10 VITALS — BP 104/66 | HR 112 | Ht 70.0 in | Wt 178.8 lb

## 2016-09-10 DIAGNOSIS — I4892 Unspecified atrial flutter: Secondary | ICD-10-CM | POA: Diagnosis not present

## 2016-09-10 DIAGNOSIS — I359 Nonrheumatic aortic valve disorder, unspecified: Secondary | ICD-10-CM

## 2016-09-10 DIAGNOSIS — I5032 Chronic diastolic (congestive) heart failure: Secondary | ICD-10-CM

## 2016-09-10 DIAGNOSIS — N2581 Secondary hyperparathyroidism of renal origin: Secondary | ICD-10-CM | POA: Diagnosis not present

## 2016-09-10 DIAGNOSIS — D509 Iron deficiency anemia, unspecified: Secondary | ICD-10-CM | POA: Diagnosis not present

## 2016-09-10 DIAGNOSIS — I480 Paroxysmal atrial fibrillation: Secondary | ICD-10-CM

## 2016-09-10 DIAGNOSIS — I483 Typical atrial flutter: Secondary | ICD-10-CM

## 2016-09-10 DIAGNOSIS — I4891 Unspecified atrial fibrillation: Secondary | ICD-10-CM

## 2016-09-10 DIAGNOSIS — I050 Rheumatic mitral stenosis: Secondary | ICD-10-CM

## 2016-09-10 DIAGNOSIS — Z9861 Coronary angioplasty status: Secondary | ICD-10-CM

## 2016-09-10 DIAGNOSIS — Z952 Presence of prosthetic heart valve: Secondary | ICD-10-CM

## 2016-09-10 DIAGNOSIS — I2510 Atherosclerotic heart disease of native coronary artery without angina pectoris: Secondary | ICD-10-CM

## 2016-09-10 DIAGNOSIS — Z7901 Long term (current) use of anticoagulants: Secondary | ICD-10-CM

## 2016-09-10 DIAGNOSIS — Z5181 Encounter for therapeutic drug level monitoring: Secondary | ICD-10-CM

## 2016-09-10 DIAGNOSIS — N186 End stage renal disease: Secondary | ICD-10-CM

## 2016-09-10 DIAGNOSIS — I214 Non-ST elevation (NSTEMI) myocardial infarction: Secondary | ICD-10-CM | POA: Diagnosis not present

## 2016-09-10 DIAGNOSIS — E875 Hyperkalemia: Secondary | ICD-10-CM | POA: Diagnosis not present

## 2016-09-10 DIAGNOSIS — I10 Essential (primary) hypertension: Secondary | ICD-10-CM

## 2016-09-10 LAB — POCT INR: INR: 4.1

## 2016-09-10 MED ORDER — AMIODARONE HCL 200 MG PO TABS: 200.0000 mg | ORAL_TABLET | Freq: Two times a day (BID) | ORAL | 3 refills | Status: DC

## 2016-09-10 MED ORDER — BUPROPION HCL ER (SR) 150 MG PO TB12: 150.0000 mg | ORAL_TABLET | Freq: Two times a day (BID) | ORAL | 3 refills | Status: DC

## 2016-09-10 NOTE — Telephone Encounter (Signed)
Thanks

## 2016-09-10 NOTE — Telephone Encounter (Signed)
Pt of Dr. Angelena Form Spoke to wife, who is on patient's scanned DPR (02/13/16). This was routed to NL triage - he is seen at [facility] office for PT INR monitoring.  He has appt today. Wife notes he will be there today, she isn't cancelling the appt. She wanted this communicated as FYI, clinical staff to be aware that he felt poorly yesterday (dialysis day) and had some symptoms of "just feeling bad", low BPs. Identified no specific symptoms other than patient feeling "wiped out", "tired", "bloated". Symptoms have persisted today but he reports some improvement. Advised to keep appt but if urgent symptoms or needing to cancel, to notify so that clinical staff can address further.

## 2016-09-10 NOTE — Telephone Encounter (Signed)
I spoke with pt. He reports heart rate was elevated (90's to 108) at dialysis today. States he was told it was regular.  He reports he is feeling fine but is asking if he could see a provider in office and have EKG done.  He is coming in for Coumadin appt today.  I scheduled him to see Cecilie Kicks, NP today at 2:00

## 2016-09-10 NOTE — Telephone Encounter (Signed)
Patient has a coumadin appt today only. No visit scheduled with provider. Note routed to Dr. Camillia Herter nurse to be addressed.

## 2016-09-10 NOTE — Telephone Encounter (Signed)
New message      Pt c/o BP issue: STAT if pt c/o blurred vision, one-sided weakness or slurred speech  1. What are your last 5 BP readings? Yesterday 95/72 HR 108; today at dialysis 121/72; 79 resting, 102/80 standing HR 79 both predialysis;  During dialysis 119/85 HR 106, 113/75 HR 89, 101/78 HR 112, 91/61 HR 115; post dialysis 104/71 HR 108 2. Are you having any other symptoms (ex. Dizziness, headache, blurred vision, passed out)?  Felt bad yesterday 3. What is your BP issue?  Pt has an appt today at 3pm. Wife want to know if pt should be seen today?

## 2016-09-10 NOTE — Patient Instructions (Addendum)
Medication Instructions:  Please increase your Amiodarone to 200 mg twice a day. Please start Wellbutrin 150 mg twice a day. Stop Celexa. Continue all other medications as listed.  Follow-Up: Follow up in the At Fib clinic in 1 week.  If you need a refill on your cardiac medications before your next appointment, please call your pharmacy.  Thank you for choosing [facility]!!

## 2016-09-10 NOTE — Progress Notes (Signed)
Cardiology Office Note   Date:  09/10/2016   ID:  [name], DOB August 03, 1956, MRN [id number]  PCP:  Teressa Lower, MD  Cardiologist:  Dr. Angelena Form    No chief complaint on file.     History of Present Illness: [name] is a 61 y.o. male who presents for return of atrial fib.  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 In 01/2016, he was cleared to stop Plavix but remained on ASA + Coumadin. Per notes, it was felt by the transplant team that he would need to be off Plavix to be considered for transplantation. He was admitted 02/16/16 with midsternal chest pain with radiation to his jaw/teeth. EKG showed anterior STEMI. LHC 02/15/16: acute occlusion of SVG-LAD at anastamosis, patent LCx with moderate restenosis, moderate RCA disease, patent Y graft to diagonal with moderate ostial stenosis, s/p angioplasty/DES to SVG-LAD extending into the native vessel - this was a difficult procedure c/b large right groin hematoma. 2D 02/15/16: EF 55-60%, HK basal mid ateroseptal myocardium and HK of apical anterior myocardium, calcified AV with mild AS, mild AI, calcified MV with mobility restriction of posterior leaflet with mild MR, mod dilated LAE, PASP 46mHg. His post-hospital course notable for NSVT,  prominent bifascicular block on EKG post-cath, ABL anemia, nocturnal hypoxia (known for patient), and episodic hypotension. Coumadin was held for several days and actually reversed due to supratherapeutic INR and progressive anemia. Hgb was 11.7 on admission and drifted down to a nadir of 7.9. He received 3 u PRBC total. CT abd pelvis ruled out retroperitoneal hemorrhage. Groin [place] without pseudoaneurysm. Atorvastatin was increased from 1/2 tablet daily (wife was giving actually every other day due to prior myalgias) to full tablet daily. Hgb on day of DC was 10.3 and Coumadin was restarted. The plan was outlined for triple therapy initially and stopping aspirin 1-2 weeks after PCI, which Dr. NMeda Coffeecalculated 2 weeks at 02/28/16. Long term decisions will need to be made in 6 months to decide if it is safe to stop Plavix at some point - the patient is concerned that this time delay would exclude him from a kidney transplant (currently on the list), but our concern is that he developed STEMI only a week after discontinuation of Plavix. F/u Hgb 6/19 was 11.1  He was found to be back in afib in 02/2016 at outpatient follow up. Week later at pharm D visit back in sinus. Admitted 03/2016 for SOB. Felt like he was volume overloaded. Fluid managed with HD. Echo 03/25/16 with preserved LVEF.moderate MR, bioprosthetic AVR. Grade 2 diastolic dysfunction.    Hospitalized 11/3-11/7 with SOB and return to atrial fib/flutter. He was placed on amiodarone and ultimately underwent DCCV on 07/12/16 with return to NSR. It was recommended he continue amiodarone 4054mBID x 2 weeks, then 20061mID x  2 weeks, then 278m daily. He also went home requiring home oxygen at rest as well as with ambulation and at night..Marland KitchenHospital course otherwise notable for hypotension during most of his stay which improved post-DCCV. Labs notable for normal TSH, Hgb 11.2, LFTs OK.   Pt is on coumadin and for INR eval today.  Today he called in  with faster HR and he had not been feeling well yesterday.   His HR was elevated in dialysis and BP stable.   Currently feeling well.  But was concerned about being out of rhythm.  EKG with RBBB QTC 513.  Also please note his celexa was stopped on amiodarone due to prolonging QTC but they have been concerned that his anxiety would return so taking 10 mg daily.   I explained he should not take.I did talk with pharmacy here and we will add welbutrin 150 BID.   I have asked them to ask PCP to order if he agrees.    Past Medical History:  Diagnosis Date  . Acute blood loss anemia    a. 02/2016 due to groin hematoma. Rec 3 U PRBC.  .Marland KitchenAnemia   . Aortic heart valve prolapse 04/2013   a. s/p bioprosthetic AVR at time of CABG.  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 Bifascicular block   . CAD (coronary artery disease) 04/2013   a. 04/2013: s/p CABG x 2 (Y SVG -LAD & D2). b. 01/2015: NSTEMI s/p DES to LCx, BMS to SVG-LAD. c. NSTEMI 05/2015 during AF/AFL - non-flow limiting FFR. d. Low risk nuc 3/17. e. STEMI 6/17 after coming off Plavix, s/p DES to SVG-LAD into native vessel.  . Carotid artery disease ([place]    a. 1-39% stenosis bilaterally in 06/2015.   .Marland KitchenChronic respiratory failure (HFostoria   . 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 b. He had a renal Tx from 2008 to 2011, but subsequent rejection [place]    . Hematoma    a. Large right groin hematoma after cath 02/2016 with associated ABL anemia.  . Hypertension   . Multiple sclerosis ([place]ver[place]   . Nocturnal hypoxemia   . On home oxygen therapy    pt states he has not been wearing it  . PAF (paroxysmal atrial fibrillation) (HH[place]    a. h/o, placed on amiodarone 07/2016 due to recurrence.  . Paroxysmal atrial flutter (HM[place]    a. During 05/2015 admission - SVT, atrial flutter, and PAF.  .Marland KitchenPeripheral vascular disease (HLivingston Wheeler    Cath in 01/2015 required 45 cm Destination  [place]  . Renal transplant failure and rejection   . S/P CABG x 2 04/2013   s/p CABG x 2 (Y SVG -LAD & D2);   .Marland KitchenSleep apnea    a. intolerant of bipap.  .Marland KitchenSVT (supraventricular tachycardia) (HMex[place]   . Valvular heart disease    a. 2D Echo 03/25/16: mild LVH, EF 50-55%, grade 2 Dd, AVR present with mild AI, mod MR, severely dilated LA, mildly dilated RV, mod RAE, PASP 72.    Past Surgical History:  Procedure Laterality Date  . AV FISTULA PLACEMENT    . CARDIAC CATHETERIZATION N/A 05/26/2015   Procedure: Left Heart Cath and Coronary Angiography;  Surgeon: DLeonie Man MD;  [facility];  Service: Cardiovascular;  Laterality: N/A;  . CARDIAC CATHETERIZATION N/A 05/26/2015   Procedure: Intravascular Pressure  Wire/FFR Study;  Surgeon: Leonie Man, MD;  Location: [facility] CV LAB;  Service: Cardiovascular;  Laterality: N/A;  . CARDIAC CATHETERIZATION N/A 02/15/2016   Procedure: Left Heart Cath and Coronary Angiography;  Surgeon: Wellington Hampshire, MD;  Location: [facility] CV LAB;  Service: Cardiovascular;  Laterality: N/A;  . CARDIOVERSION N/A 07/12/2016   Procedure: CARDIOVERSION;  Surgeon: Minus Breeding, MD;  Location: [facility] ENDOSCOPY;  Service: Cardiovascular;  Laterality: N/A;  . CORONARY ARTERY BYPASS GRAFT    . excised squamous cells at rectum  2006  . flash     flash pulmonary edema  . HERNIA REPAIR  07/2011  . KIDNEY TRANSPLANT  08/2007  . LEFT HEART CATHETERIZATION WITH CORONARY ANGIOGRAM N/A 01/09/2013   Procedure: LEFT HEART CATHETERIZATION WITH CORONARY ANGIOGRAM;  Surgeon: Burnell Blanks, MD;  Location: [facility] CATH LAB;  Service: Cardiovascular;  Laterality: N/A;     Current Outpatient Prescriptions  Medication Sig Dispense Refill  . acetaminophen (TYLENOL) 500 MG tablet Take 1,000 mg by mouth every 6 (six) hours as needed for pain or fever.    Marland Kitchen albuterol (PROAIR HFA) 108 (90 BASE) MCG/ACT inhaler Inhale 1-2 puffs into the lungs every 6 (six) hours as  needed for shortness of breath.     Marland Kitchen amiodarone (PACERONE) 200 MG tablet Take 1 tablet (200 mg total) by mouth 2 (two) times daily. 180 tablet 3  . atorvastatin (LIPITOR) 40 MG tablet Take 20 mg by mouth daily.     . calcium acetate (PHOSLO) 667 MG capsule Take 4,002 mg by mouth 3 (three) times daily with meals.     . clopidogrel (PLAVIX) 75 MG tablet Take 1 tablet (75 mg total) by mouth daily. 90 tablet 3  . docusate sodium (COLACE) 100 MG capsule Take 300 mg by mouth 2 (two) times daily.    . folic acid (FOLVITE) 1 MG tablet Take 400 mcg by mouth daily.     Marland Kitchen gabapentin (NEURONTIN) 300 MG capsule Take 300 mg by mouth at bedtime.     . metoprolol tartrate (LOPRESSOR) 25 MG tablet Take 0.5 tablets (12.5 mg total) by mouth 2 (two) times daily. 180 tablet 3  . midodrine (PROAMATINE) 10 MG tablet Take 1 tablet (10 mg total) by mouth every Monday, Wednesday, and Friday with hemodialysis.    Marland Kitchen multivitamin (RENA-VIT) TABS tablet Take 1 tablet by mouth daily. 30 tablet 1  . nitroGLYCERIN (NITROSTAT) 0.4 MG SL tablet Place 0.4 mg under the tongue every 5 (five) minutes as needed for chest pain (Take one tabe every 5-10 minutes for chest pain. Once you have taken three tabs you need to notify your MD or go to ER if chest pain not gone.).    Marland Kitchen OXYGEN 2lpm as needed    . ranitidine (ZANTAC) 150 MG tablet Take 150 mg by mouth daily.    . sodium polystyrene (KAYEXALATE) 15 GM/60ML suspension Take 15 g by mouth See admin instructions. Only take 15 g on Sun / Tues / Thurs / Sat (non dialysis days)    . umeclidinium-vilanterol (ANORO ELLIPTA) 62.5-25 MCG/INH AEPB Inhale 1 puff into the lungs daily. 28 each 0  . UNABLE TO FIND Med Name: BIPAP    . warfarin (COUMADIN) 7.5 MG tablet Take 3.75-7.5 mg by mouth as directed. Takes 1/2 tab on Tues, Sat 3.47m Takes 1 tab all other days    . buPROPion (WELLBUTRIN SR) 150 MG 12 hr tablet Take 1 tablet (150 mg total) by mouth 2 (two)  times daily. 180 tablet 3   No  current facility-administered medications for this visit.     Allergies:   Diphenhydramine; Penicillins; Adhesive [tape]; Amoxicillin; Ativan [lorazepam]; and Other    Social History:  The patient  reports that he quit smoking about 18 years ago. His smoking use included Cigarettes. He has a 40.00 pack-year smoking history. He has never used smokeless tobacco. He reports that he does not drink alcohol or use drugs.   Family History:  The patient's family history includes Diabetes in his father; Emphysema in his mother; Heart attack in his maternal aunt, maternal grandfather, and maternal uncle; Heart failure in his mother; Hypertension in his maternal aunt; Lupus in his sister.    ROS:  General:no colds or fevers, + weight loss Skin:no rashes or ulcers HEENT:no blurred vision, no congestion CV:see HPI PUL:see HPI GI:no diarrhea constipation or melena, no indigestion GU:no hematuria, no dysuria MS:no joint pain, no claudication Neuro:no syncope, no lightheadedness Endo:no diabetes, no thyroid disease Renal ESRD on HD  MWF  Wt Readings from Last 3 Encounters:  09/10/16 178 lb 12.8 oz (81.1 kg)  08/24/16 184 lb 6.4 oz (83.6 kg)  08/04/16 177 lb 12 oz (80.6 kg)     PHYSICAL EXAM: VS:  BP 104/66   Pulse (!) 112   Ht _0  (1.778 m)   Wt 178 lb 12.8 oz (81.1 kg)   BMI 25.66 kg/m  , BMI Body mass index is 25.66 kg/m. General:Pleasant affect, NAD Skin:Warm and dry, brisk capillary refill HEENT:normocephalic, sclera clear, mucus membranes moist Neck:supple, no JVD, no bruits  Heart:S1S2 RRR without murmur, gallup, rub or click, AV graft Lt arm with good thrill  Lungs:clear without rales, + rhonchi, no wheezes MWU:XLKG, non tender, + BS, do not palpate liver spleen or masses Ext:no lower ext edema, 2+ pedal pulses, 2+ radial pulses Neuro:alert and oriented X 3, MAE, follows commands, + facial symmetry    EKG:  EKG is ordered today. The ekg ordered today demonstrates a flutter  RBBB, QTC 513 ms.  Rate 112.   Recent Labs: 07/09/2016: ALT 26; B Natriuretic Peptide 2,414.3 07/11/2016: Magnesium 2.2 07/13/2016: BUN 28; Creatinine, Ser 5.27; Potassium 4.1; Sodium 137; TSH 1.800 08/04/2016: Hemoglobin 12.0; Platelets 140    Lipid Panel    Component Value Date/Time   CHOL 98 (L) 03/05/2016 1125   TRIG 143 03/05/2016 1125   HDL 32 (L) 03/05/2016 1125   CHOLHDL 3.1 03/05/2016 1125   VLDL 29 03/05/2016 1125   LDLCALC 37 03/05/2016 1125       Other studies Reviewed: Additional studies/ records that were reviewed today include: old EKGs .  ECHO 03/25/16  Study Conclusions  - Left ventricle: The cavity size was normal. Wall thickness was   increased in a pattern of mild LVH. Systolic function was normal.   The estimated ejection fraction was in the range of 50% to 55%.   Wall motion was normal; there were no regional wall motion   abnormalities. Features are consistent with a pseudonormal left   ventricular filling pattern, with concomitant abnormal relaxation   and increased filling pressure (grade 2 diastolic dysfunction).   Doppler parameters are consistent with high ventricular filling   pressure. - Aortic valve: A bioprosthesis was present. There was mild   regurgitation. - Mitral valve: Severely calcified annulus. There was moderate   regurgitation. - Left atrium: The atrium was severely dilated. - Right ventricle: The cavity size was mildly dilated. - Right atrium: The atrium  was moderately dilated. - Pulmonary arteries: Systolic pressure was severely increased. PA   peak pressure: 72 mm Hg (S).  Impressions:  - Low normal LV systolic function; grade 2 diastolic dysfunction   with elevated LV filling pressure; biatrial enlargement; s/p AVR   with normal gradients and mild AI; severe MAC with moderate,   eccentric MR (may be underestimated; suggest TEE to further   assess if clinically indicated); mild RVE; mild TR with severely   elevated  pulmonary pressure.   Left Heart Cath and Coronary Angiography  Conclusion    Mid LAD lesion, 100% stenosed.  Ost 2nd Diag lesion, 99% stenosed.  Ost LAD lesion, 40% stenosed.  Mid Cx lesion, 65% stenosed. The lesion was previously treated with a drug-eluting stent between one and five months ago. May 2016  Sequential SVG was injected is large. Very large (greater than 6 mm)  This is a Y graft that goes to D2 and LAD.  Sequential SVG was injected is large. Very large  Y graft to both D2 and LAD  Mid Graft lesion, 50% stenosed.  Prox Graft to Mid Graft lesion, 10% stenosed. The lesion was previously treated with a bare metal stent between one and five months ago. May 2016  Origin lesion, 40% stenosed.  Prox RCA lesion, 60% stenosed.  Dist Graft to Insertion lesion, 100% stenosed. Post intervention, there is a 0% residual stenosis.   1. Severe one-vessel coronary artery disease with acute occlusion of SVG to LAD at the anastomosis. Patent left circumflex stent with moderate restenosis. Moderate RCA disease. Patent Y graft to diagonal part with moderate ostial stenosis.  2. Successful angioplasty and drug-eluting stent placement to the anastomosis of SVG to LAD extending into the native vessel.  Recommendations: This was a very difficult procedure due to excessive tortuosity of the right artery in spite of using a long sheath. There was difficulty engaging the coronary arteries and advancing equipment. The patient developed moderate size hematoma which improved with manual compression. Recommend dual antiplatelet therapy for at least one year and ideally indefinitely given multiple stents. Warfarin can be resumed tomorrow if no significant groin hematoma. The patient can ultimately be treated with Plavix and warfarin without aspirin after at least 1-2 weeks of triple therapy. Obtain an echo. The aortic valve was not crossed.      ASSESSMENT AND PLAN:  1.  PA flutter,  discussed with Dr. Angelena Form and we increased his amiodarone back to 200 mg BID and will have him follow up with A fib clinic.  Continue coumadin.   currenlty asymptomatic.  2. medicatrion interaction with celexa and amiodarone.  He will stop Celexa and go to welbutrin.  3. CAD:  ECG with atrial flutter with inferior Q waves. He is s/p CABG and PCI May 2016 at [facility] and most recently anterior STEMI after stopping Plavix due to occlusion of SVG to LAD. Plavix stopped so he could be placed on the renal transplant list. Continue Plavix, beta blocker and statin. No ASA due to use of coumadin along with Plavix.   4. Aortic valve disease:s/p bioprosthetic AVR. Mild AI by echo July 2017.   5.  HTN:BP has been soft. He has been continued on Lopresor 12.39m BID. Unable to increase due to soft BP  6. ESRD on HD MWF  7.  Mitral stenosis moderate by echo 03/2016  8.  Multiple sclerosis   9. Anxiety now on welbutrin    Current medicines are reviewed with the patient today.  The patient Has no concerns regarding medicines.  The following changes have been made:  See above Labs/ tests ordered today include:see above  Disposition:   FU:  see above  Signed, Cecilie Kicks, NP  09/10/2016 3:20 PM    [facility] [place], [place], [place], [place] Phone: [phone]; Fax: [phone]

## 2016-09-13 ENCOUNTER — Telehealth: Payer: Self-pay | Admitting: Cardiovascular Disease

## 2016-09-13 DIAGNOSIS — E875 Hyperkalemia: Secondary | ICD-10-CM | POA: Diagnosis not present

## 2016-09-13 DIAGNOSIS — D509 Iron deficiency anemia, unspecified: Secondary | ICD-10-CM | POA: Diagnosis not present

## 2016-09-13 DIAGNOSIS — N186 End stage renal disease: Secondary | ICD-10-CM | POA: Diagnosis not present

## 2016-09-13 DIAGNOSIS — N2581 Secondary hyperparathyroidism of renal origin: Secondary | ICD-10-CM | POA: Diagnosis not present

## 2016-09-13 MED ORDER — AMIODARONE HCL 200 MG PO TABS: 200.0000 mg | ORAL_TABLET | Freq: Every day | ORAL | 3 refills | Status: DC

## 2016-09-13 NOTE — Telephone Encounter (Signed)
I rechecked appt with afib clinic and it is 09/16/16.  OK to decrease back to once daily until then?

## 2016-09-13 NOTE — Telephone Encounter (Signed)
Yes

## 2016-09-13 NOTE — Telephone Encounter (Signed)
[name] is calling because she states that [name] heart rate dropped down to the 40's and also his BP was low this weekend. Recently his prescription changed, Amiodarone he has to take 200 mg in AM and 200 mg in Pm. Please call, thanks.

## 2016-09-13 NOTE — Telephone Encounter (Signed)
I would have him go back to once daily amiodarone until he is seen tomorrow. [name]

## 2016-09-13 NOTE — Telephone Encounter (Signed)
I spoke with pt's wife and gave her instructions for pt to decrease amiodarone back to once daily.  She reports pt's heart rate is now 68.  Was also changed to wellbutrin lately and wife is wondering if this could have contributed to pt feeling poorly.  He has not taken it for 2 days.  He will discuss this at appt on 1/11

## 2016-09-13 NOTE — Telephone Encounter (Signed)
I spoke with pt. Amiodarone was increased to 200 mg twice daily at office visit with Cecilie Kicks, NP on 09/10/16. Pt reports he felt bad all weekend. Felt lightheaded and like he may pass out when he was up walking around. Heart rate on Sunday was in the mid to upper 40's and BP was 97/65.  He did not take amiodarone and wellbutrin last night.  Felt bad this morning and had to rest on way into dialysis. Initial heart rate at dialysis was in the 40's.  After dialysis today his heart rate was 66 and BP was 130/70.  He is feeling much better now.  Has not taken amiodarone today.  I today him to continue to hold amiodarone for now and I would forward to Dr. Angelena Form for review/recommendations.  Pt has appt with afib clinic on 09/14/16.

## 2016-09-15 DIAGNOSIS — N186 End stage renal disease: Secondary | ICD-10-CM | POA: Diagnosis not present

## 2016-09-15 DIAGNOSIS — N2581 Secondary hyperparathyroidism of renal origin: Secondary | ICD-10-CM | POA: Diagnosis not present

## 2016-09-15 DIAGNOSIS — D509 Iron deficiency anemia, unspecified: Secondary | ICD-10-CM | POA: Diagnosis not present

## 2016-09-15 DIAGNOSIS — E875 Hyperkalemia: Secondary | ICD-10-CM | POA: Diagnosis not present

## 2016-09-16 ENCOUNTER — Encounter (HOSPITAL_COMMUNITY): Payer: Self-pay | Admitting: Nurse Practitioner

## 2016-09-16 ENCOUNTER — Ambulatory Visit (HOSPITAL_COMMUNITY)
Admission: RE | Admit: 2016-09-16 | Discharge: 2016-09-16 | Disposition: A | Discharge status: Home / Self Care | Payer: Medicare Other | Source: Ambulatory Visit | Attending: Nurse Practitioner | Admitting: Nurse Practitioner

## 2016-09-16 VITALS — BP 140/82 | HR 58 | Ht 70.0 in | Wt 184.8 lb

## 2016-09-16 DIAGNOSIS — Z992 Dependence on renal dialysis: Secondary | ICD-10-CM | POA: Diagnosis not present

## 2016-09-16 DIAGNOSIS — I480 Paroxysmal atrial fibrillation: Secondary | ICD-10-CM

## 2016-09-16 DIAGNOSIS — Z79899 Other long term (current) drug therapy: Secondary | ICD-10-CM | POA: Diagnosis not present

## 2016-09-16 DIAGNOSIS — N186 End stage renal disease: Secondary | ICD-10-CM | POA: Insufficient documentation

## 2016-09-16 DIAGNOSIS — I120 Hypertensive chronic kidney disease with stage 5 chronic kidney disease or end stage renal disease: Secondary | ICD-10-CM | POA: Insufficient documentation

## 2016-09-16 DIAGNOSIS — I739 Peripheral vascular disease, unspecified: Secondary | ICD-10-CM | POA: Diagnosis not present

## 2016-09-16 DIAGNOSIS — Z880 Allergy status to penicillin: Secondary | ICD-10-CM | POA: Diagnosis not present

## 2016-09-16 DIAGNOSIS — G4730 Sleep apnea, unspecified: Secondary | ICD-10-CM | POA: Diagnosis not present

## 2016-09-16 DIAGNOSIS — Z87891 Personal history of nicotine dependence: Secondary | ICD-10-CM | POA: Diagnosis not present

## 2016-09-16 DIAGNOSIS — I2510 Atherosclerotic heart disease of native coronary artery without angina pectoris: Secondary | ICD-10-CM | POA: Diagnosis not present

## 2016-09-16 DIAGNOSIS — I471 Supraventricular tachycardia: Secondary | ICD-10-CM | POA: Diagnosis not present

## 2016-09-16 DIAGNOSIS — Z951 Presence of aortocoronary bypass graft: Secondary | ICD-10-CM | POA: Diagnosis not present

## 2016-09-16 DIAGNOSIS — Z9981 Dependence on supplemental oxygen: Secondary | ICD-10-CM | POA: Diagnosis not present

## 2016-09-16 DIAGNOSIS — G35 Multiple sclerosis: Secondary | ICD-10-CM | POA: Diagnosis not present

## 2016-09-16 DIAGNOSIS — Z953 Presence of xenogenic heart valve: Secondary | ICD-10-CM | POA: Insufficient documentation

## 2016-09-16 DIAGNOSIS — Z7901 Long term (current) use of anticoagulants: Secondary | ICD-10-CM | POA: Insufficient documentation

## 2016-09-16 NOTE — Progress Notes (Signed)
Primary Care Physician: Teressa Lower, MD Referring Physician: Cecilie Kicks, NP  Cardiologist: Dr. Julianne Handler, MD   [name] is a 61 y.o. 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 He was placed on amiodarone and ultimately underwent DCCV on 07/12/16 with return to NSR. It was recommended he continue amiodarone '400mg'$  BID x 2 weeks, then '200mg'$  BID x 2 weeks, then '200mg'$  daily. He also went home requiring home oxygen at rest as well as with ambulation and at night.Marland Kitchen Hospital course otherwise notable for hypotension during most of his stay which improved post-DCCV. Labs notable for normal TSH, Hgb 11.2, LFTs OK  He was seen by Cecilie Kicks 1/5 for c/o  faster HR and he had not been feeling well from the day before. His HR was elevated in dialysis and BP stable. Currently feeling well.  But was concerned about being out of rhythm. EKG with RBBB QTC 513 ms in afib with RVR.  Also please note his celexa was stopped on amiodarone due to prolonging QTC but they have been concerned that his anxiety would return so taking 10 mg daily.Welbutrin 150 mg BID was added. Amiodarone was increased to 200 mg bid.  He is in the afib clinic for f/u. He is in SR. He states that he felt terrible over the weekend just being placed on wellbutrin and  coming off celexa, and havind amiodarone increased.Marland Kitchen He did not know what was making him feel bad so he stopped wellbutrin and decreased amiodarone back to 200 mg a day. He really would like to be back on clelexa. I reviewed his ekg's and when he is in SR he qtc is in range. With his BBB, and in afib his QTC is prolonged which not a true reading. Can tolerate a longer qtc on amiodarone up to 600 ms. I also discussed with pharmacist and Dr. Rayann Heman and is ok for pt to resume celexa but will restart at 10 mg and check EKG in one week .  Today, he denies symptoms of palpitations, chest pain, shortness of breath, orthopnea, PND, lower extremity edema, dizziness, presyncope, syncope, or neurologic sequela. The patient is tolerating medications without difficulties and is otherwise without complaint today.   Past Medical History:  Diagnosis Date  . Acute blood loss anemia    a. 02/2016 due to groin hematoma. Rec 3 U PRBC.  Marland Kitchen Anemia   . Aortic heart valve prolapse 04/2013   a. s/p bioprosthetic AVR at time of CABG.  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 Bifascicular block   . CAD (coronary artery disease) 04/2013   a. 04/2013: s/p CABG x 2 (Y SVG -LAD & D2). b. 01/2015: NSTEMI s/p DES to LCx, BMS to SVG-LAD. c. NSTEMI 05/2015 during AF/AFL - non-flow limiting FFR. d. Low risk nuc 3/17. e. STEMI 6/17 after coming off Plavix, s/p DES  to SVG-LAD into native vessel.  . Carotid artery disease ([place])    a. 1-39% stenosis bilaterally in 06/2015.   Marland Kitchen Chronic respiratory failure ([place])   . 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 b. He had a renal Tx from 2008 to 2011, but subsequent rejection - Gets HD in [place], [place] on MWF schedule.    . Hematoma    a. Large right groin hematoma after cath 02/2016 with associated ABL anemia.  . Hypertension   . Multiple sclerosis ([place])   . Nocturnal hypoxemia   . On home oxygen therapy    pt states he has not been  wearing it  . PAF (paroxysmal atrial fibrillation) ([place])    a. h/o, placed on amiodarone 07/2016 due to recurrence.  . Paroxysmal atrial flutter ([place])    a. During 05/2015 admission - SVT, atrial flutter, and PAF.  Marland Kitchen Peripheral vascular disease ([place])    Cath in 01/2015 required 45 cm Dest[place]  . Renal transplant failure and rejection   . 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 intolerant of bipap.  Marland Kitchen SVT (supraventricular tachycardia) ([place])   . Valvular heart disease    a. 2D Echo 03/25/16: mild LVH, EF 50-55%, grade 2 Dd, AVR present with mild AI, mod MR, severely dilated LA, mildly dilated RV, mod RAE, PASP 72.   Past Surgical History:  Procedure Laterality Date  . AV FISTULA PLACEMENT    . CARDIAC CATHETERIZATION N/A 05/26/2015   Procedure: Left Heart Cath and Coronary Angiography;  Surgeon: Leonie Man, MD;  Location: [facility];  Service: Cardiovascular;  Laterality: N/A;  . CARDIAC CATHETERIZATION N/A 05/26/2015   Procedure: Intravascular Pressure Wire/FFR Study;  Surgeon: Leonie Man, MD;  Location: [facility];  Service: Cardiovascular;  Laterality: N/A;  . CARDIAC CATHETERIZATION N/A 02/15/2016   Procedure: Left Heart Cath and Coronary Angiography;  Surgeon: Wellington Hampshire, MD;  Location: [facility];  Service: Cardiovascular;  Laterality: N/A;  . CARDIOVERSION N/A 07/12/2016   Procedure: CARDIOVERSION;  Surgeon: Minus Breeding, MD;  Location: [facility] ENDOSCOPY;  Service: Cardiovascular;  Laterality: N/A;  . CORONARY ARTERY BYPASS GRAFT    . excised squamous cells at rectum  2006  . flash     flash pulmonary edema  . HERNIA REPAIR  07/2011  . KIDNEY TRANSPLANT  08/2007  . LEFT HEART CATHETERIZATION WITH CORONARY ANGIOGRAM N/A 01/09/2013   Procedure: LEFT HEART CATHETERIZATION WITH CORONARY ANGIOGRAM;  Surgeon: Burnell Blanks, MD;  Location: [facility] CATH LAB;  Service: Cardiovascular;  Laterality: N/A;     Current Outpatient Prescriptions  Medication Sig Dispense Refill  . acetaminophen (TYLENOL) 500 MG tablet Take 1,000 mg by mouth every 6 (six) hours as needed for pain or fever.    Marland Kitchen albuterol (PROAIR HFA) 108 (90 BASE) MCG/ACT inhaler Inhale 1-2 puffs into the lungs every 6 (six) hours as needed for shortness of breath.     Marland Kitchen amiodarone (PACERONE) 200 MG tablet Take 1 tablet (200 mg total) by mouth daily. 90 tablet 3  . atorvastatin (LIPITOR) 40 MG tablet Take 20 mg by mouth daily.     . calcium acetate (PHOSLO) 667 MG capsule Take 4,002 mg by mouth 3 (three) times daily with meals.     . clopidogrel (PLAVIX) 75 MG tablet Take 1  tablet (75 mg total) by mouth daily. 90 tablet 3  . docusate sodium (COLACE) 100 MG capsule Take 300 mg by mouth 2 (two) times daily.    . folic acid (FOLVITE) 1 MG tablet Take 400 mcg by mouth daily.     Marland Kitchen gabapentin (NEURONTIN) 300 MG capsule Take 300 mg by mouth at bedtime.     . metoprolol tartrate (LOPRESSOR) 25 MG tablet Take 0.5 tablets (12.5 mg total) by mouth 2 (two) times daily. 180 tablet 3  . midodrine (PROAMATINE) 10 MG tablet Take 1 tablet (10 mg total) by mouth every Monday, Wednesday, and Friday with hemodialysis.    Marland Kitchen multivitamin (RENA-VIT) TABS tablet Take 1 tablet by mouth daily. 30 tablet 1  . nitroGLYCERIN (NITROSTAT) 0.4 MG SL tablet Place 0.4 mg under the tongue every 5 (five) minutes as needed for chest pain (Take one tabe every 5-10 minutes for chest pain. Once you have taken three tabs you need to notify your MD or go to ER if chest pain not gone.).    Marland Kitchen OXYGEN 2lpm as needed    . ranitidine (ZANTAC) 150 MG tablet Take 150 mg by mouth daily.    . sodium polystyrene (KAYEXALATE) 15 GM/60ML suspension Take 15 g by mouth See admin instructions. Only take 15 g on Sun / Tues / Thurs / Sat (non dialysis days)    . umeclidinium-vilanterol (ANORO ELLIPTA) 62.5-25 MCG/INH AEPB Inhale 1 puff into the lungs daily. 28 each 0  . UNABLE TO FIND Med  Name: BIPAP    . warfarin (COUMADIN) 7.5 MG tablet Take 3.75-7.5 mg by mouth as directed. Takes 1/2 tab on Tues, Sat 3.'75mg'$  Takes 1 tab all other days    . buPROPion (WELLBUTRIN SR) 150 MG 12 hr tablet Take 1 tablet (150 mg total) by mouth 2 (two) times daily. (Patient not taking: Reported on 09/16/2016) 180 tablet 3   No current facility-administered medications for this encounter.     Allergies  Allergen Reactions  . Diphenhydramine Itching    Only with IV doses. Tolerates oral.  . Penicillins Other (See Comments)    migraine  . Adhesive [Tape] Rash    Please use paper tape  . Amoxicillin Rash    migraine  . Ativan [Lorazepam] Anxiety  . Other Rash    Please use paper tape    Social History   Social History  . Marital status: Married    Spouse name: N/A  . Number of children: N/A  . Years of education: N/A   Occupational History  . Disabled    Social History Main Topics  . Smoking status: Former Smoker    Packs/day: 2.00    Years: 20.00    Types: Cigarettes    Quit date: 08/06/1998  . Smokeless tobacco: Never Used  . Alcohol use No  . Drug use: No  . Sexual activity: Not on file   Other Topics Concern  . Not on file   Social History Narrative   No history of premature CAD in either parents or siblings. However, his maternal grandfather and 2 maternal uncles had coronary artery disease.    Family History  Problem Relation Age of Onset  . Heart failure Mother     started in 26s  . Emphysema Mother     smoked  . Diabetes Father   . Lupus Sister   . Heart attack Maternal Grandfather   . Heart attack Maternal Uncle   . Heart attack Maternal Aunt   .  Hypertension Maternal Aunt   . Stroke Neg Hx     ROS- All systems are reviewed and negative except as per the HPI above  Physical Exam: Vitals:   09/16/16 1110  BP: 140/82  Pulse: (!) 58  Weight: 184 lb 12.8 oz (83.8 kg)  Height: '5\' 10"'$  (1.778 m)   Wt Readings from Last 3 Encounters:  09/16/16 184  lb 12.8 oz (83.8 kg)  09/10/16 178 lb 12.8 oz (81.1 kg)  08/24/16 184 lb 6.4 oz (83.6 kg)    Labs: Lab Results  Component Value Date   NA 137 07/13/2016   K 4.1 07/13/2016   CL 93 (L) 07/13/2016   CO2 29 07/13/2016   GLUCOSE 91 07/13/2016   BUN 28 (H) 07/13/2016   CREATININE 5.27 (H) 07/13/2016   CALCIUM 8.4 (L) 07/13/2016   PHOS 7.4 (H) 07/12/2016   MG 2.2 07/11/2016   Lab Results  Component Value Date   INR 4.1 09/10/2016   Lab Results  Component Value Date   CHOL 98 (L) 03/05/2016   HDL 32 (L) 03/05/2016   LDLCALC 37 03/05/2016   TRIG 143 03/05/2016     GEN- The patient is well appearing, alert and oriented x 3 today.   Head- normocephalic, atraumatic Eyes-  Sclera clear, conjunctiva pink Ears- hearing intact Oropharynx- clear Neck- supple, no JVP Lymph- no cervical lymphadenopathy Lungs- Clear to ausculation bilaterally, normal work of breathing Heart- Regular rate and rhythm, no murmurs, rubs or gallops, PMI not laterally displaced GI- soft, NT, ND, + BS Extremities- no clubbing, cyanosis, or edema MS- no significant deformity or atrophy Skin- no rash or lesion Psych- euthymic mood, full affect Neuro- strength and sensation are intact  EKG-sinus brady with 1st degree av block at 58 bpm, LAD, RBBB, pr int 234 ms, qrs int 120 ms, QTc 455 ms Epic records reviewed    Assessment and Plan: 1. Paroxysmal afib Currently back in SR Continue amiodarone at 200 mg a day Continue metoprolol and discussed how he could use an extra tablet if goes into afib He really wants to be back on celexa and amiodarone is much more forgiving with qtc. He has has normal qt intervals in SR and  rapid afib/BBB are common to make qtc look prolonged but is not accurate in that situation. Will restart celexa 10 mg and he will come in next week for EKG for qt interval, if ok can go back to usual dose at 20 mg qd Will recheck tsh/liver at that time since he has been on amiodarone going  on 3 months  Continue warfarin Continue HD   Butch Penny C. Carroll, [facility] [address] [place], [place] [address] [phone]

## 2016-09-17 DIAGNOSIS — N186 End stage renal disease: Secondary | ICD-10-CM | POA: Diagnosis not present

## 2016-09-17 DIAGNOSIS — N2581 Secondary hyperparathyroidism of renal origin: Secondary | ICD-10-CM | POA: Diagnosis not present

## 2016-09-17 DIAGNOSIS — E875 Hyperkalemia: Secondary | ICD-10-CM | POA: Diagnosis not present

## 2016-09-17 DIAGNOSIS — D509 Iron deficiency anemia, unspecified: Secondary | ICD-10-CM | POA: Diagnosis not present

## 2016-09-17 NOTE — Addendum Note (Signed)
Encounter addended by: Sherran Needs, NP on: 09/17/2016 11:25 AM<BR>    Actions taken: LOS modified

## 2016-09-20 DIAGNOSIS — E875 Hyperkalemia: Secondary | ICD-10-CM | POA: Diagnosis not present

## 2016-09-20 DIAGNOSIS — N186 End stage renal disease: Secondary | ICD-10-CM | POA: Diagnosis not present

## 2016-09-20 DIAGNOSIS — N2581 Secondary hyperparathyroidism of renal origin: Secondary | ICD-10-CM | POA: Diagnosis not present

## 2016-09-20 DIAGNOSIS — D509 Iron deficiency anemia, unspecified: Secondary | ICD-10-CM | POA: Diagnosis not present

## 2016-09-22 DIAGNOSIS — D509 Iron deficiency anemia, unspecified: Secondary | ICD-10-CM | POA: Diagnosis not present

## 2016-09-22 DIAGNOSIS — N186 End stage renal disease: Secondary | ICD-10-CM | POA: Diagnosis not present

## 2016-09-22 DIAGNOSIS — N2581 Secondary hyperparathyroidism of renal origin: Secondary | ICD-10-CM | POA: Diagnosis not present

## 2016-09-22 DIAGNOSIS — E875 Hyperkalemia: Secondary | ICD-10-CM | POA: Diagnosis not present

## 2016-09-24 ENCOUNTER — Ambulatory Visit (INDEPENDENT_AMBULATORY_CARE_PROVIDER_SITE_OTHER): Payer: Medicare Other | Admitting: Pharmacist

## 2016-09-24 DIAGNOSIS — I2510 Atherosclerotic heart disease of native coronary artery without angina pectoris: Secondary | ICD-10-CM

## 2016-09-24 DIAGNOSIS — I4891 Unspecified atrial fibrillation: Secondary | ICD-10-CM

## 2016-09-24 DIAGNOSIS — I4892 Unspecified atrial flutter: Secondary | ICD-10-CM | POA: Diagnosis not present

## 2016-09-24 DIAGNOSIS — Z7901 Long term (current) use of anticoagulants: Secondary | ICD-10-CM

## 2016-09-24 DIAGNOSIS — I483 Typical atrial flutter: Secondary | ICD-10-CM | POA: Diagnosis not present

## 2016-09-24 DIAGNOSIS — D509 Iron deficiency anemia, unspecified: Secondary | ICD-10-CM | POA: Diagnosis not present

## 2016-09-24 DIAGNOSIS — E875 Hyperkalemia: Secondary | ICD-10-CM | POA: Diagnosis not present

## 2016-09-24 DIAGNOSIS — Z5181 Encounter for therapeutic drug level monitoring: Secondary | ICD-10-CM | POA: Diagnosis not present

## 2016-09-24 DIAGNOSIS — I214 Non-ST elevation (NSTEMI) myocardial infarction: Secondary | ICD-10-CM | POA: Diagnosis not present

## 2016-09-24 DIAGNOSIS — Z9861 Coronary angioplasty status: Secondary | ICD-10-CM | POA: Diagnosis not present

## 2016-09-24 DIAGNOSIS — N186 End stage renal disease: Secondary | ICD-10-CM | POA: Diagnosis not present

## 2016-09-24 DIAGNOSIS — N2581 Secondary hyperparathyroidism of renal origin: Secondary | ICD-10-CM | POA: Diagnosis not present

## 2016-09-24 LAB — POCT INR: INR: 2.6

## 2016-09-27 ENCOUNTER — Ambulatory Visit (HOSPITAL_COMMUNITY)
Admission: RE | Admit: 2016-09-27 | Discharge: 2016-09-27 | Disposition: A | Discharge status: Home / Self Care | Payer: Medicare Other | Source: Ambulatory Visit | Attending: Nurse Practitioner | Admitting: Nurse Practitioner

## 2016-09-27 ENCOUNTER — Ambulatory Visit (INDEPENDENT_AMBULATORY_CARE_PROVIDER_SITE_OTHER): Payer: Medicare Other | Admitting: Internal Medicine

## 2016-09-27 ENCOUNTER — Encounter: Payer: Self-pay | Admitting: Internal Medicine

## 2016-09-27 ENCOUNTER — Encounter (HOSPITAL_COMMUNITY): Payer: Self-pay | Admitting: Nurse Practitioner

## 2016-09-27 VITALS — BP 106/66 | HR 62 | Ht 69.0 in | Wt 181.0 lb

## 2016-09-27 DIAGNOSIS — Z9861 Coronary angioplasty status: Secondary | ICD-10-CM | POA: Diagnosis not present

## 2016-09-27 DIAGNOSIS — I480 Paroxysmal atrial fibrillation: Secondary | ICD-10-CM | POA: Diagnosis not present

## 2016-09-27 DIAGNOSIS — Z79899 Other long term (current) drug therapy: Secondary | ICD-10-CM | POA: Diagnosis not present

## 2016-09-27 DIAGNOSIS — J449 Chronic obstructive pulmonary disease, unspecified: Secondary | ICD-10-CM | POA: Diagnosis not present

## 2016-09-27 DIAGNOSIS — I2510 Atherosclerotic heart disease of native coronary artery without angina pectoris: Secondary | ICD-10-CM | POA: Diagnosis not present

## 2016-09-27 DIAGNOSIS — N2581 Secondary hyperparathyroidism of renal origin: Secondary | ICD-10-CM | POA: Diagnosis not present

## 2016-09-27 DIAGNOSIS — D509 Iron deficiency anemia, unspecified: Secondary | ICD-10-CM | POA: Diagnosis not present

## 2016-09-27 DIAGNOSIS — I4891 Unspecified atrial fibrillation: Secondary | ICD-10-CM | POA: Insufficient documentation

## 2016-09-27 DIAGNOSIS — N186 End stage renal disease: Secondary | ICD-10-CM | POA: Diagnosis not present

## 2016-09-27 DIAGNOSIS — E875 Hyperkalemia: Secondary | ICD-10-CM | POA: Diagnosis not present

## 2016-09-27 LAB — COMPREHENSIVE METABOLIC PANEL
ALBUMIN: 3.3 g/dL — AB (ref 3.5–5.0)
ALT: 21 U/L (ref 17–63)
AST: 22 U/L (ref 15–41)
Alkaline Phosphatase: 101 U/L (ref 38–126)
Anion gap: 10 (ref 5–15)
BUN: 23 mg/dL — AB (ref 6–20)
CHLORIDE: 100 mmol/L — AB (ref 101–111)
CO2: 30 mmol/L (ref 22–32)
CREATININE: 5.67 mg/dL — AB (ref 0.61–1.24)
Calcium: 7.6 mg/dL — ABNORMAL LOW (ref 8.9–10.3)
GFR calc non Af Amer: 10 mL/min — ABNORMAL LOW (ref >60)
GFR, EST AFRICAN AMERICAN: 11 mL/min — AB (ref >60)
Glucose, Bld: 95 mg/dL (ref 65–99)
Potassium: 3.7 mmol/L (ref 3.5–5.1)
SODIUM: 140 mmol/L (ref 135–145)
Total Bilirubin: 0.6 mg/dL (ref 0.3–1.2)
Total Protein: 7.9 g/dL (ref 6.5–8.1)

## 2016-09-27 LAB — PULMONARY FUNCTION TEST
DL/VA % pred: 65 %
DL/VA: 2.98 ml/min/mmHg/L
DLCO cor % pred: 47 %
DLCO cor: 14.61 ml/min/mmHg
DLCO unc % pred: 46 %
DLCO unc: 14.31 ml/min/mmHg
FEF 25-75 Post: 0.5 L/sec
FEF 25-75 Pre: 0.41 L/sec
FEF2575-%Change-Post: 19 %
FEF2575-%PRED-PRE: 14 %
FEF2575-%Pred-Post: 17 %
FEV1-%Change-Post: 0 %
FEV1-%PRED-PRE: 32 %
FEV1-%Pred-Post: 32 %
FEV1-POST: 1.12 L
FEV1-PRE: 1.13 L
FEV1FVC-%Change-Post: -5 %
FEV1FVC-%Pred-Pre: 71 %
FEV6-%Change-Post: 5 %
FEV6-%PRED-PRE: 46 %
FEV6-%Pred-Post: 49 %
FEV6-POST: 2.15 L
FEV6-Pre: 2.04 L
FEV6FVC-%Change-Post: 0 %
FEV6FVC-%PRED-POST: 101 %
FEV6FVC-%Pred-Pre: 101 %
FVC-%CHANGE-POST: 5 %
FVC-%PRED-POST: 48 %
FVC-%PRED-PRE: 46 %
FVC-PRE: 2.11 L
FVC-Post: 2.23 L
POST FEV6/FVC RATIO: 96 %
PRE FEV1/FVC RATIO: 53 %
PRE FEV6/FVC RATIO: 96 %
Post FEV1/FVC ratio: 50 %
RV % PRED: 179 %
RV: 3.99 L
TLC % PRED: 96 %
TLC: 6.59 L

## 2016-09-27 LAB — TSH: TSH: 3.605 u[IU]/mL (ref 0.350–4.500)

## 2016-09-27 NOTE — Patient Instructions (Addendum)
You have moderately severe copd but no need for treatment at this point other than to keep your rescue inhaler handy  Plan A = Breathe clean air    Plan B = Backup Only use your albuterol as a rescue medication to be used if you can't catch your breath by resting or doing a relaxed purse lip breathing pattern.  - The less you use it, the better it will work when you need it. - Ok to use the inhaler up to 2 puffs  every 4 hours if you must but call for appointment if use goes up over your usual need - Don't leave home without it !!  (think of it like the spare tire for your car)   Plan C = Crisis - only use your albuterol nebulizer if you first try Plan B and it fails to help > ok to use the nebulizer up to every 4 hours but if start needing it regularly call for immediate appointment    If you are satisfied with your treatment plan,  let your doctor know and he/she can either refill your medications or you can return here when your prescription runs out.     If in any way you are not 100% satisfied,  please tell us.  If 100% better, tell your friends!  Pulmonary follow up is as needed

## 2016-09-27 NOTE — Progress Notes (Signed)
Subjective:    Patient ID: [name], male    DOB: Feb 15, 1956,    MRN: [id number]   Brief patient profile:  36 yowm with multiple sclerosis quit smoking 1999 with new doe x 2015 and using 02 during the day x 07/2016 and referred to pulmonary clinic 08/24/2016 by Dr Julianne Handler with GOLD III copd 09/27/2016   08/24/2016 1st [place] [place] [facility] office visit/ [name]   Chief Complaint  Patient presents with  . Pulmonary Consult    Referred by Foy Guadalajara, PA for eval of COPD.  Pt c/o SOB since 2016. He gets winded walking to the mailbox and back and walking up an incline.    sleeps in 45 degrees recliner since 2008 kidney  transplant Breathing always worse the day before HD [facility] = can't walk 100 yards even at a slow pace at a flat grade s stopping due to sob   eg Can do HT but not [employer]  Some nasal congestion but no chest congestion/ no cough except on "Sunday evenings  rec Try anoro one click each am take two deep drags to see if helps     09/27/2016  f/u ov/[name] re:  COPD III  Chief Complaint  Patient presents with  . Follow-up    PFT's done today. Breathing is unchanged. No new co's today.   no change doe on Anoro vs off  = MMRC3 as above/ continues better p HD and the worst on Sunday evenings p misses HD over w/e   No obvious day to day or daytime variability or assoc excess/ purulent sputum or mucus plugs or hemoptysis or cp or chest tightness, subjective wheeze or overt sinus or hb symptoms. No unusual exp hx or h/o childhood pna/ asthma or knowledge of premature birth.  Sleeping ok without nocturnal  or early am exacerbation  of respiratory  c/o's or need for noct saba. Also denies any obvious fluctuation of symptoms with weather or environmental changes or other aggravating or alleviating factors except as outlined above   Current Medications, Allergies, Complete Past Medical History, Past Surgical History, Family History, and Social History were reviewed in [facility]  electronic medical record.  ROS  The following are not active complaints unless bolded sore throat, dysphagia, dental problems, itching, sneezing,  nasal congestion or excess/ purulent secretions, ear ache,   fever, chills, sweats, unintended wt loss, classically pleuritic or exertional cp,  orthopnea pnd or leg swelling, presyncope, palpitations, abdominal pain, anorexia, nausea, vomiting, diarrhea  or change in bowel or bladder habits, change in stools or urine, dysuria,hematuria,  rash, arthralgias, visual complaints, headache, numbness, weakness or ataxia or problems with walking or coordination,  change in mood/affect or memory.                 Objective:   Physical Exam  amb chronically ill appearing wm nad   09/27/2016        18"$ 1    08/24/16 184 lb 6.4 oz (83.6 kg)  08/04/16 177 lb 12 oz (80.6 kg)  07/13/16 182 lb 8.7 oz (82.8 kg)    Vital signs reviewed - Note on arrival 02 sats  94% on RA    HEENT: poor dentition, nl turbinates, and oropharynx. Nl external ear canals without cough reflex   NECK :  without JVD/Nodes/TM/ nl carotid upstrokes bilaterally   LUNGS: no acc muscle use,     CV:  RRR  no s3 or murmur or increase in P2,  Min  pitting edema L >  R   ABD:  soft and nontender with  End insp hoover's in the supine position. No bruits or organomegaly appreciated, bowel sounds nl  MS:  Awkward  gait/ ext warm without deformities, calf tenderness, cyanosis or clubbing No obvious joint restrictions   SKIN: warm and dry without lesions    NEURO:  alert, approp, nl sensorium with  no motor or cerebellar deficits apparent.     I personally reviewed images and agree with radiology impression as follows:  CXR:   08/04/16 Mild CHF superimposed upon COPD. The CHF has improved since the previous study. No pneumonia nor pneumothorax nor large pleural effusion. No definite rib abnormality.     Assessment & Plan:

## 2016-09-27 NOTE — Progress Notes (Signed)
EKG reviewed. QTc stable. Advised ok to increase Celexa to '20mg'$  daily. Follow up with Dr Angelena Form as scheduled.  Would repeat EKG at that visit  Chanetta Marshall, NP 09/27/2016 2:23 PM

## 2016-09-27 NOTE — Progress Notes (Signed)
Patient in for repeat ekg after resuming celexa '10mg'$ . Chanetta Marshall NP to review EKG.

## 2016-09-28 DIAGNOSIS — Z79899 Other long term (current) drug therapy: Secondary | ICD-10-CM | POA: Insufficient documentation

## 2016-09-28 NOTE — Assessment & Plan Note (Signed)
Quit smoking 1999 Spirometry 08/24/2016  FEV1 0.48 (22%)  Ratio 49 > trial of anoro one click each am  > no better doe - PFT's  09/27/2016  FEV1 1.12 (32 % ) ratio 50  p 0 % improvement from saba p  nothing prior to study with DLCO  46/47c % corrects to 65  % for alv volume  > rec d/c anoro  I had an extended final summary discussion with the patient reviewing all relevant studies completed to date and  lasting 15 to 20 minutes of a 25 minute visit on the following issues:   When respiratory symptoms begin or become refractory well after a patient reports complete smoking cessation,  Especially when this wasn't the case while they were smoking, a red flag is raised based on the work of Dr Kris Mouton which states:  if you quit smoking when your best day FEV1 is still well preserved it is highly unlikely you will progress to severe disease.  That is to say, once the smoking stops,  the symptoms should not suddenly erupt or markedly worsen.  If so, the differential diagnosis should include  obesity/deconditioning,  LPR/Reflux/Aspiration syndromes,  occult CHF, or  especially side effect of medications commonly used in this population - especially amiodarone  (see separate a/p)   Since no better on lama/laba ok to leave off and if worse try different rx eg bevespi or stiolto  Pulmonary f/u is prn unless needs for studies for amiodarone rx

## 2016-09-28 NOTE — Assessment & Plan Note (Addendum)
?   First ordered 07/2016 See baseline pfts 09/27/16   Patients typically have been on amiodarone for 6-12 months before this complication manifests.  Of note, serial clinical evaluation for symptoms such as cough dyspnea or fevers is  the preferred method of monitoring for pulmonary toxicity because a decrease in DLCO or lung volumes is a nonspecific for toxicity. Pathologically amiodarone pulmonary toxicity may appear as interstitial pneumonitis, eosinophilic pneumonia, organizing pneumonia, pulmonary fibrosis or less commonly as diffuse alveolar hemorrhage, pulmonary nodules or pleural effusions.  Risk factors for pulmonary toxicity include age greater than 68, daily dose greater than equal to 400 mg, a high cumulative dose, or pre-existing lung disease    So he has 2/4 risk factors > most important aspect of care is serial eval of ex tolerance, advised   I had an extended final summary discussion with the patient reviewing all relevant studies completed to date and  lasting 15 to 20 minutes of a 25 minute visit    Each maintenance medication was reviewed in detail including most importantly the difference between maintenance and as needed and under what circumstances the prns are to be used.  Please see AVS for specific  Instructions which are unique to this visit and I personally typed out  which were reviewed in detail in writing with the patient and a copy provided.    Pulmonary f/u can be prn unless serial pfts requested by cards

## 2016-09-29 DIAGNOSIS — N2581 Secondary hyperparathyroidism of renal origin: Secondary | ICD-10-CM | POA: Diagnosis not present

## 2016-09-29 DIAGNOSIS — D509 Iron deficiency anemia, unspecified: Secondary | ICD-10-CM | POA: Diagnosis not present

## 2016-09-29 DIAGNOSIS — N186 End stage renal disease: Secondary | ICD-10-CM | POA: Diagnosis not present

## 2016-09-29 DIAGNOSIS — E875 Hyperkalemia: Secondary | ICD-10-CM | POA: Diagnosis not present

## 2016-09-30 ENCOUNTER — Ambulatory Visit (INDEPENDENT_AMBULATORY_CARE_PROVIDER_SITE_OTHER): Payer: Medicare Other | Admitting: Cardiovascular Disease

## 2016-09-30 ENCOUNTER — Encounter: Payer: Self-pay | Admitting: Cardiovascular Disease

## 2016-09-30 VITALS — BP 118/72 | HR 61 | Ht 69.0 in | Wt 182.8 lb

## 2016-09-30 DIAGNOSIS — I10 Essential (primary) hypertension: Secondary | ICD-10-CM | POA: Diagnosis not present

## 2016-09-30 DIAGNOSIS — I359 Nonrheumatic aortic valve disorder, unspecified: Secondary | ICD-10-CM

## 2016-09-30 DIAGNOSIS — I480 Paroxysmal atrial fibrillation: Secondary | ICD-10-CM | POA: Diagnosis not present

## 2016-09-30 DIAGNOSIS — I25810 Atherosclerosis of coronary artery bypass graft(s) without angina pectoris: Secondary | ICD-10-CM

## 2016-09-30 DIAGNOSIS — Z9861 Coronary angioplasty status: Secondary | ICD-10-CM | POA: Diagnosis not present

## 2016-09-30 DIAGNOSIS — I340 Nonrheumatic mitral (valve) insufficiency: Secondary | ICD-10-CM

## 2016-09-30 DIAGNOSIS — I2510 Atherosclerotic heart disease of native coronary artery without angina pectoris: Secondary | ICD-10-CM

## 2016-09-30 NOTE — Progress Notes (Signed)
Chief Complaint  Patient presents with  . 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 I met him in 2014 when I performed his heart cath at [facility] following cardiac arrest after a VATS for lung cancer. He was found to have moderate CAD at that time. He failed medical management and then underwent CABG and AVR at [facility]. He had been followed at [facility] since then. In May 2016, he was admitted for a NSTEMI at [facility] and underwent PCI of the left circumflex (Resolute drug-eluting stent) and SVG to LAD (Herculink bare metal stent). Echocardiogram done prior to admission in 04/2015 showed an EF of 37-04%, grade 1 diastolic dysfunction, mild to moderate aortic regurgitation and mild MS/MR. Per notes he has prior history of PAF during HD but very short lived. He was admitted to [facility] 9/14-9/20/16, prompted by development of recurrent symptomatic atrial arrhythmias during HD session. During that admission, he developed SVT during HD and on arrival to ER was in atrial flutter. He ruled in for NSTEMI with peak troponin 1.54 and underwent a nuclear stress test that was abnormal with apical ischemia. He underwent LHC 05/26/15 which showed 100% mid LAD stenosis, 99% Diagonal stenosis, patent SVG to LAD with ostial 40% stenosis, 65% mid Circumflex stent restenosis with FFR of 0.87 in the Circumflex. With the nonischemic FFR of the circumflex lesion, it was felt that this was not a flow-limiting lesion that could explain his high risk stress test. This is the distribution that his stress test showed positivity during his non-STEMI in May.  It was felt possible that this is simply infarct with peri-infarct ischemia in the setting of atrial fibrillation. It was also felt that his low EF by stress test was due to possible nuclear gating error. No coronary intervention was required. Continued medical therapy was recommended. Echo 05/27/15: EF 55-60%, no RWMA, mild AS/mild AI, mod MS, severely dilated LA. Nuclear stress test 11/2015 in prep for repeat transplant was low risk at [facility]. In 01/2016, he was cleared to stop Plavix but remained on ASA + Coumadin. Per notes, it was felt by the transplant team that he would need to be off Plavix to be considered for transplantation. He was admitted 02/16/16 with midsternal chest pain with radiation to his jaw/teeth. EKG showed anterior STEMI. LHC 02/15/16: acute occlusion of SVG-LAD at anastamosis, patent LCx with moderate restenosis, moderate RCA disease, patent Y graft to diagonal with moderate ostial stenosis, s/p angioplasty/DES to SVG-LAD extending into the native vessel - this was a difficult procedure c/b large right groin hematoma. Echo  02/15/16: EF 55-60%, HK basal mid ateroseptal myocardium and HK of apical anterior myocardium, calcified AV with mild AS, mild AI, calcified MV with mobility restriction of posterior leaflet with mild MR, mod dilated LAE, PASP 78mHg. His post-hospital course notable for NSVT, prominent bifascicular block on EKG post-cath, ABL anemia, nocturnal hypoxia (known for patient), and episodic hypotension. Coumadin was held for several days and actually reversed due to supratherapeutic INR and progressive anemia. Hgb was 11.7 on admission and drifted down to a  nadir of 7.9. He received 3 u PRBC total. CT abd pelvis ruled out retroperitoneal hemorrhage. Groin [place] was without pseudoaneurysm. Hgb on day of DC was 10.3 and Coumadin was restarted. The plan was outlined for triple therapy initially and stopping aspirin 1-2 weeks after PCI. He was admitted to [facility] 03/24/16 with volume overload.  Echo 03/25/16 with preserved LVEF.moderate MR, bioprosthetic AVR. Grade 2 diastolic dysfunction. Re-admitted to [facility] November 2017 with dyspnea and found to be back in atrial fib/flutter. He was placed on amiodarone and underwent DCCV on 07/12/16 with return to sinus. He has been followed in the atrial fib clinic.   The patient presents for follow-up today and is doing well. He denies any recurrent CP, SOB, or palpitations. His energy level is normal.   Primary Care Physician: Teressa Lower, MD   Past Medical History:  Diagnosis Date  . Acute blood loss anemia    a. 02/2016 due to groin hematoma. Rec 3 U PRBC.  Marland Kitchen Anemia   . Aortic heart valve prolapse 04/2013   a. s/p bioprosthetic AVR at time of CABG.  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 Bifascicular block   . CAD (coronary artery disease) 04/2013   a. 04/2013: s/p CABG x 2 (Y SVG -LAD & D2). b. 01/2015: NSTEMI s/p DES to LCx, BMS to SVG-LAD. c. NSTEMI 05/2015 during AF/AFL - non-flow limiting FFR. d. Low risk nuc 3/17. e. STEMI 6/17 after coming off Plavix, s/p DES to SVG-LAD into native vessel.  . Carotid artery disease ([place])    a. 1-39% stenosis bilaterally in 06/2015.   Marland Kitchen Chronic respiratory failure ([place])   . 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 b. He had a renal Tx from 2008 to 2011, but subsequent rejection - Gets HD in [place], [place] on MWF schedule.    . Hematoma    a. Large right groin hematoma after cath 02/2016 with associated ABL anemia.  . Hypertension   . Multiple sclerosis ([place])   . Nocturnal hypoxemia   . On home oxygen therapy    pt states he has not been wearing it  . PAF (paroxysmal atrial fibrillation) ([place])    a. h/o, placed on amiodarone 07/2016 due to recurrence.  . Paroxysmal atrial flutter ([place])    a. During 05/2015 admission - SVT, atrial flutter, and PAF.  Marland Kitchen Peripheral vascular disease ([place])    Cath in 01/2015 required 45 cm Destination [place]  .  Renal transplant failure and rejection   . 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 intolerant of bipap.  Marland Kitchen SVT (supraventricular tachycardia) ([place])   . Valvular heart disease    a. 2D Echo 03/25/16: mild LVH, EF 50-55%, grade 2 Dd, AVR present with mild AI, mod MR, severely dilated LA, mildly dilated RV, mod RAE, PASP 72.    Past Surgical History:  Procedure Laterality Date  . AV FISTULA PLACEMENT    . CARDIAC CATHETERIZATION N/A 05/26/2015   Procedure: Left Heart Cath and Coronary Angiography;  Surgeon: Leonie Man, MD;  Location: [place] CV LAB;  Service: Cardiovascular;  Laterality: N/A;  . CARDIAC CATHETERIZATION N/A 05/26/2015   Procedure: Intravascular Pressure Wire/FFR Study;  Surgeon: Leonie Man, MD;  Location: [place] CV [facility];  Service: Cardiovascular;  Laterality: N/A;  . CARDIAC CATHETERIZATION N/A 02/15/2016   Procedure:  Left Heart Cath and Coronary Angiography;  Surgeon: Wellington Hampshire, MD;  Location: [place] CV LAB;  Service: Cardiovascular;  Laterality: N/A;  . CARDIOVERSION N/A 07/12/2016   Procedure: CARDIOVERSION;  Surgeon: Minus Breeding, MD;  Location: [facility] ENDOSCOPY;  Service: Cardiovascular;  Laterality: N/A;  . CORONARY ARTERY BYPASS GRAFT    . excised squamous cells at rectum  2006  . flash     flash pulmonary edema  . HERNIA REPAIR  07/2011  . KIDNEY TRANSPLANT  08/2007  . LEFT HEART CATHETERIZATION WITH CORONARY ANGIOGRAM N/A 01/09/2013   Procedure: LEFT HEART CATHETERIZATION WITH CORONARY ANGIOGRAM;  Surgeon: Burnell Blanks, MD;  Location: [facility] CATH LAB;  Service: Cardiovascular;  Laterality: N/A;    Current Outpatient Prescriptions  Medication Sig Dispense Refill  . acetaminophen (TYLENOL) 500 MG tablet Take 1,000 mg by mouth every 6 (six) hours as needed for pain or fever.    Marland Kitchen albuterol (PROAIR HFA) 108 (90 BASE) MCG/ACT inhaler Inhale 1-2 puffs into the lungs every 6 (six) hours as needed for  shortness of breath.     Marland Kitchen amiodarone (PACERONE) 200 MG tablet Take 1 tablet (200 mg total) by mouth daily. 90 tablet 3  . atorvastatin (LIPITOR) 40 MG tablet Take 20 mg by mouth daily.     . calcium acetate (PHOSLO) 667 MG capsule Take 4,002 mg by mouth 3 (three) times daily with meals.     . citalopram (CELEXA) 20 MG tablet Take 20 mg by mouth daily.    . clopidogrel (PLAVIX) 75 MG tablet Take 1 tablet (75 mg total) by mouth daily. 90 tablet 3  . docusate sodium (COLACE) 100 MG capsule Take 300 mg by mouth 2 (two) times daily.    . folic acid (FOLVITE) 1 MG tablet Take 400 mcg by mouth daily.     Marland Kitchen gabapentin (NEURONTIN) 300 MG capsule Take 300 mg by mouth at bedtime.     . metoprolol tartrate (LOPRESSOR) 25 MG tablet Take 0.5 tablets (12.5 mg total) by mouth 2 (two) times daily. 180 tablet 3  . midodrine (PROAMATINE) 10 MG tablet Take 1 tablet (10 mg total) by mouth every Monday, Wednesday, and Friday with hemodialysis.    Marland Kitchen multivitamin (RENA-VIT) TABS tablet Take 1 tablet by mouth daily. 30 tablet 1  . nitroGLYCERIN (NITROSTAT) 0.4 MG SL tablet Place 0.4 mg under the tongue every 5 (five) minutes as needed for chest pain (Take one tabe every 5-10 minutes for chest pain. Once you have taken three tabs you need to notify your MD or go to ER if chest pain not gone.).    Marland Kitchen OXYGEN 2lpm as needed    . ranitidine (ZANTAC) 150 MG tablet Take 150 mg by mouth daily.    . sodium polystyrene (KAYEXALATE) 15 GM/60ML suspension Take 15 g by mouth See admin instructions. Only take 15 g on Sun / Tues / Thurs / Sat (non dialysis days)    . UNABLE TO FIND Med Name: BIPAP    . warfarin (COUMADIN) 7.5 MG tablet Take 3.75-7.5 mg by mouth as directed. Takes 1/2 tab on Tues, Sat 3.41m Takes 1 tab all other days     No current facility-administered medications for this visit.     Allergies  Allergen Reactions  . Diphenhydramine Itching    Only with IV doses. Tolerates oral.  . Penicillins Other (See  Comments)    migraine  . Adhesive [Tape] Rash    Please use paper tape  . Amoxicillin  Rash    migraine  . Ativan [Lorazepam] Anxiety  . Other Rash    Please use paper tape    Social History   Social History  . Marital status: Married    Spouse name: N/A  . Number of children: N/A  . Years of education: N/A   Occupational History  . Disabled    Social History Main Topics  . Smoking status: Former Smoker    Packs/day: 2.00    Years: 20.00    Types: Cigarettes    Quit date: 08/06/1998  . Smokeless tobacco: Never Used  . Alcohol use No  . Drug use: No  . Sexual activity: Not on file   Other Topics Concern  . Not on file   Social History Narrative   No history of premature CAD in either parents or siblings. However, his maternal grandfather and 2 maternal uncles had coronary artery disease.    Family History  Problem Relation Age of Onset  . Heart failure Mother     started in 37s  . Emphysema Mother     smoked  . Diabetes Father   . Lupus Sister   . Heart attack Maternal Grandfather   . Heart attack Maternal Uncle   . Heart attack Maternal Aunt   . Hypertension Maternal Aunt   . Stroke Neg Hx     Review of Systems:  As stated in the HPI and otherwise negative.   BP 118/72   Pulse 61   Ht _0  (1.753 m)   Wt 182 lb 12.8 oz (82.9 kg)   BMI 26.99 kg/m   Physical Examination: General: Well developed, well nourished, NAD  HEENT: OP clear, mucus membranes moist  SKIN: warm, dry. No rashes. Neuro: No focal deficits  Musculoskeletal: Muscle strength 5/5 all ext  Psychiatric: Mood and affect normal  Neck: No JVD, no carotid bruits, no thyromegaly, no lymphadenopathy.  Lungs:Clear bilaterally, no wheezes, rhonci, crackles Cardiovascular: Regular rate and rhythm. No murmurs, gallops or rubs. Abdomen:Soft. Bowel sounds present. Non-tender.  Extremities: No lower extremity edema. Pulses are 2 + in the bilateral DP/PT.  Echo 03/25/16: Left ventricle: The  cavity size was normal. Wall thickness was   increased in a pattern of mild LVH. Systolic function was normal.   The estimated ejection fraction was in the range of 50% to 55%.   Wall motion was normal; there were no regional wall motion   abnormalities. Features are consistent with a pseudonormal left   ventricular filling pattern, with concomitant abnormal relaxation   and increased filling pressure (grade 2 diastolic dysfunction).   Doppler parameters are consistent with high ventricular filling   pressure. - Aortic valve: A bioprosthesis was present. There was mild   regurgitation. - Mitral valve: Severely calcified annulus. There was moderate   regurgitation. - Left atrium: The atrium was severely dilated. - Right ventricle: The cavity size was mildly dilated. - Right atrium: The atrium was moderately dilated. - Pulmonary arteries: Systolic pressure was severely increased. PA   peak pressure: 72 mm Hg (S).  Impressions:  - Low normal LV systolic function; grade 2 diastolic dysfunction   with elevated LV filling pressure; biatrial enlargement; s/p AVR   with normal gradients and mild AI; severe MAC with moderate,   eccentric MR (may be underestimated; suggest TEE to further   assess if clinically indicated); mild RVE; mild TR with severely   elevated pulmonary pressure.   EKG:  EKG is ordered today. The ekg ordered today  demonstrates NSR, rate 61 bpm. 1st degree AV block. PAC.   Recent Labs: 07/09/2016: B Natriuretic Peptide 2,414.3 07/11/2016: Magnesium 2.2 08/04/2016: Hemoglobin 12.0; Platelets 140 09/27/2016: ALT 21; BUN 23; Creatinine, Ser 5.67; Potassium 3.7; Sodium 140; TSH 3.605   Lipid Panel    Component Value Date/Time   CHOL 98 (L) 03/05/2016 1125   TRIG 143 03/05/2016 1125   HDL 32 (L) 03/05/2016 1125   CHOLHDL 3.1 03/05/2016 1125   VLDL 29 03/05/2016 1125   LDLCALC 37 03/05/2016 1125     Wt Readings from Last 3 Encounters:  09/30/16 182 lb 12.8 oz  (82.9 kg)  09/27/16 181 lb (82.1 kg)  09/16/16 184 lb 12.8 oz (83.8 kg)     Other studies Reviewed: Additional studies/ records that were reviewed today include: . Review of the above records demonstrates:    Assessment and Plan:   1. Atrial fibrillation/flutter: Sinus today. QTC ok. Continue coumadin for anti-coagulation. Continue Lopressor and amiodarone.    2. CAD: Stable. He is s/p CABG and  PCI May 2016 at [facility] and most recently anterior STEMI after stopping Plavix due to occlusion of SVG to LAD. STEMI occurred when Plavix was stopped so he could be placed on the renal transplant list. Continue Plavix, beta blocker and statin. No ASA due to use of coumadin along with Plavix.   3. Aortic valve disease: s/p bioprosthetic AVR. Mild AI by echo July 2017. He will continue to use antibiotic prophylaxis before dental visits.   4. Mitral regurgiation: moderate by echo July 2017. Follow  5. HTN: BP well controlled. No changes.   6. ESRD: on HD  7. Carotid artery disease: Mild bilateral plaque by dopplers October 2016.   Extensive review of records today from hospital stay and visits last year.   Current medicines are reviewed at length with the patient today.  The patient does not have concerns regarding medicines.  The following changes have been made:  no change  Labs/ tests ordered today include:   Orders Placed This Encounter  Procedures  . EKG 12-Lead    Disposition:   FU with me in 3  months  Signed, Lauree Chandler, MD 09/30/2016 10:04 AM    [facility] [place], [place], [place]  [address] Phone: [phone]; Fax: [phone]

## 2016-09-30 NOTE — Patient Instructions (Signed)
Medication Instructions:  Your physician recommends that you continue on your current medications as directed. Please refer to the Current Medication list given to you today.   Labwork: none  Testing/Procedures: none  Follow-Up: Your physician recommends that you schedule a follow-up appointment in: 3 months--Scheduled for April 19,2018 at 9:20    Any Other Special Instructions Will Be Listed Below (If Applicable).     If you need a refill on your cardiac medications before your next appointment, please call your pharmacy.

## 2016-10-01 DIAGNOSIS — D509 Iron deficiency anemia, unspecified: Secondary | ICD-10-CM | POA: Diagnosis not present

## 2016-10-01 DIAGNOSIS — E875 Hyperkalemia: Secondary | ICD-10-CM | POA: Diagnosis not present

## 2016-10-01 DIAGNOSIS — N186 End stage renal disease: Secondary | ICD-10-CM | POA: Diagnosis not present

## 2016-10-01 DIAGNOSIS — N2581 Secondary hyperparathyroidism of renal origin: Secondary | ICD-10-CM | POA: Diagnosis not present

## 2016-10-03 ENCOUNTER — Encounter: Payer: Self-pay | Admitting: Internal Medicine

## 2016-10-04 DIAGNOSIS — D509 Iron deficiency anemia, unspecified: Secondary | ICD-10-CM | POA: Diagnosis not present

## 2016-10-04 DIAGNOSIS — N186 End stage renal disease: Secondary | ICD-10-CM | POA: Diagnosis not present

## 2016-10-04 DIAGNOSIS — E875 Hyperkalemia: Secondary | ICD-10-CM | POA: Diagnosis not present

## 2016-10-04 DIAGNOSIS — N2581 Secondary hyperparathyroidism of renal origin: Secondary | ICD-10-CM | POA: Diagnosis not present

## 2016-10-06 DIAGNOSIS — N186 End stage renal disease: Secondary | ICD-10-CM | POA: Diagnosis not present

## 2016-10-06 DIAGNOSIS — T8612 Kidney transplant failure: Secondary | ICD-10-CM | POA: Diagnosis not present

## 2016-10-06 DIAGNOSIS — D509 Iron deficiency anemia, unspecified: Secondary | ICD-10-CM | POA: Diagnosis not present

## 2016-10-06 DIAGNOSIS — E875 Hyperkalemia: Secondary | ICD-10-CM | POA: Diagnosis not present

## 2016-10-06 DIAGNOSIS — N2581 Secondary hyperparathyroidism of renal origin: Secondary | ICD-10-CM | POA: Diagnosis not present

## 2016-10-06 DIAGNOSIS — Z992 Dependence on renal dialysis: Secondary | ICD-10-CM | POA: Diagnosis not present

## 2016-10-08 ENCOUNTER — Ambulatory Visit (INDEPENDENT_AMBULATORY_CARE_PROVIDER_SITE_OTHER): Payer: Medicare Other

## 2016-10-08 DIAGNOSIS — I2510 Atherosclerotic heart disease of native coronary artery without angina pectoris: Secondary | ICD-10-CM

## 2016-10-08 DIAGNOSIS — N186 End stage renal disease: Secondary | ICD-10-CM | POA: Diagnosis not present

## 2016-10-08 DIAGNOSIS — I4891 Unspecified atrial fibrillation: Secondary | ICD-10-CM

## 2016-10-08 DIAGNOSIS — N2581 Secondary hyperparathyroidism of renal origin: Secondary | ICD-10-CM | POA: Diagnosis not present

## 2016-10-08 DIAGNOSIS — I483 Typical atrial flutter: Secondary | ICD-10-CM

## 2016-10-08 DIAGNOSIS — E875 Hyperkalemia: Secondary | ICD-10-CM | POA: Diagnosis not present

## 2016-10-08 DIAGNOSIS — Z5181 Encounter for therapeutic drug level monitoring: Secondary | ICD-10-CM | POA: Diagnosis not present

## 2016-10-08 DIAGNOSIS — Z7901 Long term (current) use of anticoagulants: Secondary | ICD-10-CM

## 2016-10-08 DIAGNOSIS — D509 Iron deficiency anemia, unspecified: Secondary | ICD-10-CM | POA: Diagnosis not present

## 2016-10-08 DIAGNOSIS — Z9861 Coronary angioplasty status: Secondary | ICD-10-CM

## 2016-10-08 DIAGNOSIS — I4892 Unspecified atrial flutter: Secondary | ICD-10-CM

## 2016-10-08 DIAGNOSIS — I214 Non-ST elevation (NSTEMI) myocardial infarction: Secondary | ICD-10-CM

## 2016-10-08 LAB — POCT INR: INR: 2.5

## 2016-10-11 DIAGNOSIS — E875 Hyperkalemia: Secondary | ICD-10-CM | POA: Diagnosis not present

## 2016-10-11 DIAGNOSIS — D509 Iron deficiency anemia, unspecified: Secondary | ICD-10-CM | POA: Diagnosis not present

## 2016-10-11 DIAGNOSIS — N186 End stage renal disease: Secondary | ICD-10-CM | POA: Diagnosis not present

## 2016-10-11 DIAGNOSIS — N2581 Secondary hyperparathyroidism of renal origin: Secondary | ICD-10-CM | POA: Diagnosis not present

## 2016-10-13 ENCOUNTER — Other Ambulatory Visit: Payer: Self-pay | Admitting: Cardiology

## 2016-10-13 DIAGNOSIS — N186 End stage renal disease: Secondary | ICD-10-CM | POA: Diagnosis not present

## 2016-10-13 DIAGNOSIS — D509 Iron deficiency anemia, unspecified: Secondary | ICD-10-CM | POA: Diagnosis not present

## 2016-10-13 DIAGNOSIS — Z0000 Encounter for general adult medical examination without abnormal findings: Secondary | ICD-10-CM | POA: Diagnosis not present

## 2016-10-13 DIAGNOSIS — I480 Paroxysmal atrial fibrillation: Secondary | ICD-10-CM | POA: Diagnosis not present

## 2016-10-13 DIAGNOSIS — E875 Hyperkalemia: Secondary | ICD-10-CM | POA: Diagnosis not present

## 2016-10-13 DIAGNOSIS — N2581 Secondary hyperparathyroidism of renal origin: Secondary | ICD-10-CM | POA: Diagnosis not present

## 2016-10-15 DIAGNOSIS — N186 End stage renal disease: Secondary | ICD-10-CM | POA: Diagnosis not present

## 2016-10-15 DIAGNOSIS — E875 Hyperkalemia: Secondary | ICD-10-CM | POA: Diagnosis not present

## 2016-10-15 DIAGNOSIS — N2581 Secondary hyperparathyroidism of renal origin: Secondary | ICD-10-CM | POA: Diagnosis not present

## 2016-10-15 DIAGNOSIS — D509 Iron deficiency anemia, unspecified: Secondary | ICD-10-CM | POA: Diagnosis not present

## 2016-10-18 DIAGNOSIS — N2581 Secondary hyperparathyroidism of renal origin: Secondary | ICD-10-CM | POA: Diagnosis not present

## 2016-10-18 DIAGNOSIS — D509 Iron deficiency anemia, unspecified: Secondary | ICD-10-CM | POA: Diagnosis not present

## 2016-10-18 DIAGNOSIS — N186 End stage renal disease: Secondary | ICD-10-CM | POA: Diagnosis not present

## 2016-10-18 DIAGNOSIS — E875 Hyperkalemia: Secondary | ICD-10-CM | POA: Diagnosis not present

## 2016-10-20 DIAGNOSIS — N2581 Secondary hyperparathyroidism of renal origin: Secondary | ICD-10-CM | POA: Diagnosis not present

## 2016-10-20 DIAGNOSIS — D509 Iron deficiency anemia, unspecified: Secondary | ICD-10-CM | POA: Diagnosis not present

## 2016-10-20 DIAGNOSIS — E875 Hyperkalemia: Secondary | ICD-10-CM | POA: Diagnosis not present

## 2016-10-20 DIAGNOSIS — N186 End stage renal disease: Secondary | ICD-10-CM | POA: Diagnosis not present

## 2016-10-22 ENCOUNTER — Telehealth (HOSPITAL_COMMUNITY): Payer: Self-pay | Admitting: *Deleted

## 2016-10-22 DIAGNOSIS — D509 Iron deficiency anemia, unspecified: Secondary | ICD-10-CM | POA: Diagnosis not present

## 2016-10-22 DIAGNOSIS — N186 End stage renal disease: Secondary | ICD-10-CM | POA: Diagnosis not present

## 2016-10-22 DIAGNOSIS — E875 Hyperkalemia: Secondary | ICD-10-CM | POA: Diagnosis not present

## 2016-10-22 DIAGNOSIS — N2581 Secondary hyperparathyroidism of renal origin: Secondary | ICD-10-CM | POA: Diagnosis not present

## 2016-10-22 NOTE — Telephone Encounter (Signed)
Pt spouse understood recommendations

## 2016-10-22 NOTE — Telephone Encounter (Signed)
Pt spouse called to let us know that the patients bp has run low yesterday and today.  Pt is now 91/73.  Pts spouse held his evening dose of metoprolol last night and again this morning since bp still low and he was due to dialysis today. Pts HR is ranging from 104-120s but the patient does not have any symptoms from this.  She just wants to make sure that she is doing the right thing and find out when they should be concerned with the HRs.  Per Rushie Goltz, RN pts HR will be elevated with not taking metoprolol and dialysis, but if the pts BP (sys) continues to be 95 or less, do not take metoprolol, but make sure he is staying hydrated as much as he can considering his fluid restriction.  Also call us Monday with an update. If BP systolic is over 95 for evening dose take 12.5 mg of metoprolol. Continue to check BPs before doses.

## 2016-10-25 ENCOUNTER — Telehealth (HOSPITAL_COMMUNITY): Payer: Self-pay | Admitting: Nurse Practitioner

## 2016-10-25 DIAGNOSIS — N2581 Secondary hyperparathyroidism of renal origin: Secondary | ICD-10-CM | POA: Diagnosis not present

## 2016-10-25 DIAGNOSIS — E875 Hyperkalemia: Secondary | ICD-10-CM | POA: Diagnosis not present

## 2016-10-25 DIAGNOSIS — D509 Iron deficiency anemia, unspecified: Secondary | ICD-10-CM | POA: Diagnosis not present

## 2016-10-25 DIAGNOSIS — N186 End stage renal disease: Secondary | ICD-10-CM | POA: Diagnosis not present

## 2016-10-25 NOTE — Telephone Encounter (Signed)
Pt has noted afib since Friday and has not been able to take extra metoprolol due to hypotension. BP running around 96/70 I asked him to increase amiodarone to 200 mg bid and call   the office on Wednesday. If still in afib, will bring him in to consider cardioversion.

## 2016-10-25 NOTE — Telephone Encounter (Signed)
Pt called back today stating his heart rates have varied from 55-120 BP has stayed right around SBP 93/77. He only took metoprolol Friday night as all readings are below 95 SBP. Discussed with [name] NP -- recommended increasing amiodarone '200mg'$  twice a day and call on Wednesday. If still in afib will bring in for appointment to set up cardioversion. Pt verbalized understanding and was in agreement.

## 2016-10-26 DIAGNOSIS — N186 End stage renal disease: Secondary | ICD-10-CM | POA: Diagnosis not present

## 2016-10-26 DIAGNOSIS — Z452 Encounter for adjustment and management of vascular access device: Secondary | ICD-10-CM | POA: Diagnosis not present

## 2016-10-26 DIAGNOSIS — T82898A Other specified complication of vascular prosthetic devices, implants and grafts, initial encounter: Secondary | ICD-10-CM | POA: Diagnosis not present

## 2016-10-27 ENCOUNTER — Ambulatory Visit (HOSPITAL_COMMUNITY)
Admission: RE | Admit: 2016-10-27 | Discharge: 2016-10-27 | Disposition: A | Discharge status: Home / Self Care | Payer: Medicare Other | Source: Ambulatory Visit | Attending: Nurse Practitioner | Admitting: Nurse Practitioner

## 2016-10-27 ENCOUNTER — Ambulatory Visit (INDEPENDENT_AMBULATORY_CARE_PROVIDER_SITE_OTHER): Payer: Medicare Other | Admitting: *Deleted

## 2016-10-27 ENCOUNTER — Encounter (HOSPITAL_COMMUNITY): Payer: Self-pay | Admitting: Nurse Practitioner

## 2016-10-27 VITALS — BP 100/68 | HR 117 | Ht 69.0 in | Wt 177.0 lb

## 2016-10-27 DIAGNOSIS — N186 End stage renal disease: Secondary | ICD-10-CM | POA: Insufficient documentation

## 2016-10-27 DIAGNOSIS — Z7901 Long term (current) use of anticoagulants: Secondary | ICD-10-CM | POA: Diagnosis not present

## 2016-10-27 DIAGNOSIS — I4891 Unspecified atrial fibrillation: Secondary | ICD-10-CM | POA: Diagnosis not present

## 2016-10-27 DIAGNOSIS — Z8249 Family history of ischemic heart disease and other diseases of the circulatory system: Secondary | ICD-10-CM | POA: Insufficient documentation

## 2016-10-27 DIAGNOSIS — Z5181 Encounter for therapeutic drug level monitoring: Secondary | ICD-10-CM

## 2016-10-27 DIAGNOSIS — I4892 Unspecified atrial flutter: Secondary | ICD-10-CM

## 2016-10-27 DIAGNOSIS — Z880 Allergy status to penicillin: Secondary | ICD-10-CM | POA: Diagnosis not present

## 2016-10-27 DIAGNOSIS — J9610 Chronic respiratory failure, unspecified whether with hypoxia or hypercapnia: Secondary | ICD-10-CM | POA: Insufficient documentation

## 2016-10-27 DIAGNOSIS — Z87891 Personal history of nicotine dependence: Secondary | ICD-10-CM | POA: Insufficient documentation

## 2016-10-27 DIAGNOSIS — I252 Old myocardial infarction: Secondary | ICD-10-CM | POA: Insufficient documentation

## 2016-10-27 DIAGNOSIS — I214 Non-ST elevation (NSTEMI) myocardial infarction: Secondary | ICD-10-CM | POA: Diagnosis not present

## 2016-10-27 DIAGNOSIS — I471 Supraventricular tachycardia: Secondary | ICD-10-CM | POA: Insufficient documentation

## 2016-10-27 DIAGNOSIS — I483 Typical atrial flutter: Secondary | ICD-10-CM

## 2016-10-27 DIAGNOSIS — I480 Paroxysmal atrial fibrillation: Secondary | ICD-10-CM | POA: Insufficient documentation

## 2016-10-27 DIAGNOSIS — Z9861 Coronary angioplasty status: Secondary | ICD-10-CM

## 2016-10-27 DIAGNOSIS — I120 Hypertensive chronic kidney disease with stage 5 chronic kidney disease or end stage renal disease: Secondary | ICD-10-CM | POA: Diagnosis not present

## 2016-10-27 DIAGNOSIS — I2510 Atherosclerotic heart disease of native coronary artery without angina pectoris: Secondary | ICD-10-CM

## 2016-10-27 DIAGNOSIS — N2581 Secondary hyperparathyroidism of renal origin: Secondary | ICD-10-CM | POA: Diagnosis not present

## 2016-10-27 DIAGNOSIS — E875 Hyperkalemia: Secondary | ICD-10-CM | POA: Diagnosis not present

## 2016-10-27 DIAGNOSIS — Z940 Kidney transplant status: Secondary | ICD-10-CM | POA: Insufficient documentation

## 2016-10-27 DIAGNOSIS — Z888 Allergy status to other drugs, medicaments and biological substances status: Secondary | ICD-10-CM | POA: Insufficient documentation

## 2016-10-27 DIAGNOSIS — G4733 Obstructive sleep apnea (adult) (pediatric): Secondary | ICD-10-CM | POA: Insufficient documentation

## 2016-10-27 DIAGNOSIS — Z9981 Dependence on supplemental oxygen: Secondary | ICD-10-CM | POA: Insufficient documentation

## 2016-10-27 DIAGNOSIS — Z951 Presence of aortocoronary bypass graft: Secondary | ICD-10-CM | POA: Diagnosis not present

## 2016-10-27 DIAGNOSIS — I481 Persistent atrial fibrillation: Secondary | ICD-10-CM

## 2016-10-27 DIAGNOSIS — G35 Multiple sclerosis: Secondary | ICD-10-CM | POA: Insufficient documentation

## 2016-10-27 DIAGNOSIS — I4819 Other persistent atrial fibrillation: Secondary | ICD-10-CM

## 2016-10-27 DIAGNOSIS — D509 Iron deficiency anemia, unspecified: Secondary | ICD-10-CM | POA: Diagnosis not present

## 2016-10-27 DIAGNOSIS — Z79899 Other long term (current) drug therapy: Secondary | ICD-10-CM | POA: Insufficient documentation

## 2016-10-27 LAB — POCT INR: INR: 3.1

## 2016-10-28 ENCOUNTER — Telehealth: Payer: Self-pay | Admitting: *Deleted

## 2016-10-28 ENCOUNTER — Ambulatory Visit (HOSPITAL_COMMUNITY): Payer: Medicare Other | Admitting: Nurse Practitioner

## 2016-10-28 NOTE — Telephone Encounter (Signed)
-----   Message from Juluis Mire, RN sent at 10/28/2016  9:56 AM EST ----- Regarding: weekly INR checks Pt is being cardioverted on 3/8. Can he be set up for INR check the week of 2/26 and we will check INR morning of procedure as well. Pt will be expecting call to get this appt set up. Thanks! Stacy RN [facility]

## 2016-10-28 NOTE — Progress Notes (Signed)
Primary Care Physician: Teressa Lower, MD Referring Physician: Cecilie Kicks, NP  Cardiologist: Dr. Julianne Handler, MD   [name] is a 61 y.o. 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 He was placed on amiodarone and ultimately underwent DCCV on 07/12/16 with return to NSR. It was recommended he continue amiodarone '400mg'$  BID x 2 weeks, then '200mg'$  BID x 2 weeks, then '200mg'$  daily. He also went home requiring home oxygen at rest as well as with ambulation and at night.Marland Kitchen Hospital course otherwise notable for hypotension during most of his stay which improved post-DCCV. Labs notable for normal TSH, Hgb 11.2, LFTs OK  He was seen by Cecilie Kicks 1/5 for c/o  faster HR and he had not been feeling well from the day before. His HR was elevated in dialysis and BP stable. Currently feeling well.  But was concerned about being out of rhythm. EKG with RBBB QTC 513 ms in afib with RVR.  Also please note his celexa was stopped on amiodarone due to prolonging QTC but they have been concerned that his anxiety would return so taking 10 mg daily.Welbutrin 150 mg BID was added. Amiodarone was increased to 200 mg bid.  He is in the afib clinic for f/u. He is in SR. He states that he felt terrible over the weekend just being placed on wellbutrin and  coming off celexa, and havind amiodarone increased.Marland Kitchen He did not know what was making him feel bad so he stopped wellbutrin and decreased amiodarone back to 200 mg a day. He really would like to be back on clelexa. I reviewed his ekg's and when he is in SR he qtc is in range. With his BBB, and in afib his QTC is prolonged which not a true reading. Can tolerate a longer qtc on amiodarone up to 600 ms. I also discussed with pharmacist and Dr. Rayann Heman and is ok for pt to resume celexa but will restart at 10 mg and check EKG in one week .  Pt is in the afib clinic 2/21 for c/o of elevated heart rate since last week with low blood pressures and has not been able to take metoprolol to help with tachycardia. He did not feel well over the weekend but feels decent today. He continues with dialysis M/W/F. EKG shows 2:1 atrial flutter. He has been taking an extra 200 mg  Amiodarone in the pm since last Friday.  Today, he denies symptoms of palpitations, chest pain, shortness of breath, orthopnea, PND, lower extremity edema, dizziness, presyncope, syncope, or neurologic sequela. The patient is tolerating medications without difficulties and is otherwise without complaint today.   Past Medical History:  Diagnosis Date  . Acute blood loss anemia    a. 02/2016 due to groin hematoma. Rec 3 U PRBC.  Marland Kitchen Anemia   . Aortic heart valve prolapse 04/2013   a. s/p bioprosthetic  AVR at time of CABG.  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 Bifascicular block   . CAD (coronary artery disease) 04/2013   a. 04/2013: s/p CABG x 2 (Y SVG -LAD & D2). b. 01/2015: NSTEMI s/p DES to LCx, BMS to SVG-LAD. c. NSTEMI 05/2015 during AF/AFL - non-flow limiting FFR. d. Low risk nuc 3/17. e. STEMI 6/17 after coming off Plavix, s/p DES to SVG-LAD into native vessel.  . Carotid artery disease ([place])    a. 1-39% stenosis bilaterally in 06/2015.   Marland Kitchen Chronic respiratory failure ([place])   . 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 ESRD from  membranous GN and started HD in 2000. b. He had a renal Tx from 2008 to 2011, but subsequent rejection - Gets HD in [place], [place] on MWF schedule.    . Hematoma    a. Large right groin hematoma after cath 02/2016 with associated ABL anemia.  . Hypertension   . Multiple sclerosis ([place])   . Nocturnal hypoxemia   . On home oxygen therapy    pt states he has not been wearing it  . PAF (paroxysmal atrial fibrillation) ([place])    a. h/o, placed on amiodarone 07/2016 due to recurrence.  . Paroxysmal atrial flutter ([place])    a. During 05/2015 admission - SVT, atrial flutter, and PAF.  Marland Kitchen Peripheral vascular disease ([place])    Cath in 01/2015 required 45 cm [place]  . Renal transplant failure and rejection   . 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 intolerant of bipap.  Marland Kitchen SVT (supraventricular tachycardia) ([place])   . Valvular heart disease    a. 2D Echo 03/25/16: mild LVH, EF 50-55%, grade 2 Dd, AVR present with mild AI, mod MR, severely dilated LA, mildly dilated RV, mod RAE, PASP 72.   Past Surgical History:  Procedure Laterality Date  . AV FISTULA PLACEMENT    . CARDIAC CATHETERIZATION N/A 05/26/2015   Procedure: Left Heart Cath and Coronary Angiography;  Surgeon: Leonie Man, MD;  Location: [facility];  Service: Cardiovascular;  Laterality: N/A;  . CARDIAC CATHETERIZATION N/A 05/26/2015   Procedure: Intravascular Pressure Wire/FFR Study;  Surgeon: Leonie Man, MD;  Location: [facility];  Service: Cardiovascular;  Laterality: N/A;  . CARDIAC CATHETERIZATION N/A 02/15/2016   Procedure: Left Heart Cath and Coronary Angiography;  Surgeon: Wellington Hampshire, MD;  Location: [facility];  Service: Cardiovascular;  Laterality: N/A;  . CARDIOVERSION N/A 07/12/2016   Procedure: CARDIOVERSION;  Surgeon: Minus Breeding, MD;  Location: [facility] ENDOSCOPY;  Service: Cardiovascular;  Laterality: N/A;  . CORONARY ARTERY BYPASS GRAFT    . excised  squamous cells at rectum  2006  . flash     flash pulmonary edema  . HERNIA REPAIR  07/2011  . KIDNEY TRANSPLANT  08/2007  . LEFT HEART CATHETERIZATION WITH CORONARY ANGIOGRAM N/A 01/09/2013   Procedure: LEFT HEART CATHETERIZATION WITH CORONARY ANGIOGRAM;  Surgeon: Burnell Blanks, MD;  Location: [facility] CATH LAB;  Service: Cardiovascular;  Laterality: N/A;    Current Outpatient Prescriptions  Medication Sig Dispense Refill  . acetaminophen (TYLENOL) 500 MG tablet Take 1,000 mg by mouth every 6 (six) hours as needed for pain or fever.    Marland Kitchen albuterol (PROAIR HFA) 108 (90 BASE) MCG/ACT inhaler Inhale 1-2 puffs into the lungs every 6 (six) hours as needed for shortness  of breath.     Marland Kitchen amiodarone (PACERONE) 200 MG tablet Take 1 tablet (200 mg total) by mouth daily. (Patient taking differently: Take 200 mg by mouth 2 (two) times daily. ) 90 tablet 3  . atorvastatin (LIPITOR) 40 MG tablet Take 20 mg by mouth daily.     . calcium acetate (PHOSLO) 667 MG capsule Take 4,002 mg by mouth 3 (three) times daily with meals.     . citalopram (CELEXA) 20 MG tablet Take 20 mg by mouth daily.    . clopidogrel (PLAVIX) 75 MG tablet Take 1 tablet (75 mg total) by mouth daily. 90 tablet 3  . docusate sodium (COLACE) 100 MG capsule Take 300 mg by mouth 2 (two) times daily.    . folic acid (FOLVITE) 1 MG tablet Take 400 mcg by mouth daily.     Marland Kitchen gabapentin (NEURONTIN) 300 MG capsule Take 300 mg by mouth at bedtime.     . midodrine (PROAMATINE) 10 MG tablet Take 1 tablet (10 mg total) by mouth every Monday, Wednesday, and Friday with hemodialysis.    Marland Kitchen multivitamin (RENA-VIT) TABS tablet Take 1 tablet by mouth daily. 30 tablet 1  . nitroGLYCERIN (NITROSTAT) 0.4 MG SL tablet Place 0.4 mg under the tongue every 5 (five) minutes as needed for chest pain (Take one tabe every 5-10 minutes for chest pain. Once you have taken three tabs you need to notify your MD or go to ER if chest pain not gone.).    Marland Kitchen OXYGEN 2lpm as  needed    . ranitidine (ZANTAC) 150 MG tablet Take 150 mg by mouth daily.    . sodium polystyrene (KAYEXALATE) 15 GM/60ML suspension Take 15 g by mouth See admin instructions. Only take 15 g on Sun / Tues / Thurs / Sat (non dialysis days)    . UNABLE TO FIND Med Name: BIPAP    . warfarin (COUMADIN) 7.5 MG tablet Take 1/2-1 tablet by mouth daily as directed by coumadin clinic 30 tablet 1  . metoprolol tartrate (LOPRESSOR) 25 MG tablet Take 0.5 tablets (12.5 mg total) by mouth 2 (two) times daily. (Patient not taking: Reported on 10/27/2016) 180 tablet 3   No current facility-administered medications for this encounter.     Allergies  Allergen Reactions  . Diphenhydramine Itching    Only with IV doses. Tolerates oral.  . Penicillins Other (See Comments)    migraine  . Adhesive [Tape] Rash    Please use paper tape  . Amoxicillin Rash    migraine  . Ativan [Lorazepam] Anxiety  . Other Rash    Please use paper tape    Social History   Social History  . Marital status: Married    Spouse name: N/A  . Number of children: N/A  . Years of education: N/A   Occupational History  . Disabled    Social History Main Topics  . Smoking status: Former Smoker    Packs/day: 2.00    Years: 20.00    Types: Cigarettes    Quit date: 08/06/1998  . Smokeless tobacco: Never Used  . Alcohol use No  . Drug use: No  . Sexual activity: Not on file   Other Topics Concern  . Not on file   Social History Narrative   No history of premature CAD in either parents or siblings. However, his maternal grandfather and 2 maternal uncles had coronary artery disease.    Family History  Problem Relation Age of Onset  . Heart failure Mother  started in 1s  . Emphysema Mother     smoked  . Diabetes Father   . Lupus Sister   . Heart attack Maternal Grandfather   . Heart attack Maternal Uncle   . Heart attack Maternal Aunt   . Hypertension Maternal Aunt   . Stroke Neg Hx     ROS- All systems are  reviewed and negative except as per the HPI above  Physical Exam: Vitals:   10/27/16 1432  BP: 100/68  Pulse: (!) 117  Weight: 177 lb (80.3 kg)  Height: '5\' 9"'$  (1.753 m)   Wt Readings from Last 3 Encounters:  10/27/16 177 lb (80.3 kg)  09/30/16 182 lb 12.8 oz (82.9 kg)  09/27/16 181 lb (82.1 kg)    Labs: Lab Results  Component Value Date   NA 140 09/27/2016   K 3.7 09/27/2016   CL 100 (L) 09/27/2016   CO2 30 09/27/2016   GLUCOSE 95 09/27/2016   BUN 23 (H) 09/27/2016   CREATININE 5.67 (H) 09/27/2016   CALCIUM 7.6 (L) 09/27/2016   PHOS 7.4 (H) 07/12/2016   MG 2.2 07/11/2016   Lab Results  Component Value Date   INR 3.1 10/27/2016   Lab Results  Component Value Date   CHOL 98 (L) 03/05/2016   HDL 32 (L) 03/05/2016   LDLCALC 37 03/05/2016   TRIG 143 03/05/2016     GEN- The patient is well appearing, alert and oriented x 3 today.   Head- normocephalic, atraumatic Eyes-  Sclera clear, conjunctiva pink Ears- hearing intact Oropharynx- clear Neck- supple, no JVP Lymph- no cervical lymphadenopathy Lungs- Clear to ausculation bilaterally, normal work of breathing Heart- Rapid,regular rate and rhythm, no murmurs, rubs or gallops, PMI not laterally displaced GI- soft, NT, ND, + BS Extremities- no clubbing, cyanosis, or edema MS- no significant deformity or atrophy Skin- no rash or lesion Psych- euthymic mood, full affect Neuro- strength and sensation are intact  EKG-atrial flutter 2:1 with RBBB, qrs int 150 ms, qtc 526m Epic records reviewed    Assessment and Plan: 1. Paroxysmal afib Currently back in SR Continue amiodarone at 200 mg bid Take metoprolol as BP will allow, currently with hypotension 2/2 dialysis Will scheduled for TEE guided DCCV, due for INR today Continue warfarin Continue HD  F/u in one week after cardioversion   DButch PennyC. Carroll, AF[facility]GMarshall [place] [phone]

## 2016-10-28 NOTE — Telephone Encounter (Signed)
Pt wife called made the appts for 11/05/16 @ 3p & 11/11/16 @ 485 prior to Afib & DCCV. Also, she stated that the Metroprolol had been d/c'd & the Amiodarone was changed from '200mg'$  daily to '200mg'$  twice a day per AFIB clinic today.

## 2016-10-29 DIAGNOSIS — N186 End stage renal disease: Secondary | ICD-10-CM | POA: Diagnosis not present

## 2016-10-29 DIAGNOSIS — N2581 Secondary hyperparathyroidism of renal origin: Secondary | ICD-10-CM | POA: Diagnosis not present

## 2016-10-29 DIAGNOSIS — D509 Iron deficiency anemia, unspecified: Secondary | ICD-10-CM | POA: Diagnosis not present

## 2016-10-29 DIAGNOSIS — E875 Hyperkalemia: Secondary | ICD-10-CM | POA: Diagnosis not present

## 2016-11-01 DIAGNOSIS — N186 End stage renal disease: Secondary | ICD-10-CM | POA: Diagnosis not present

## 2016-11-01 DIAGNOSIS — N2581 Secondary hyperparathyroidism of renal origin: Secondary | ICD-10-CM | POA: Diagnosis not present

## 2016-11-01 DIAGNOSIS — D509 Iron deficiency anemia, unspecified: Secondary | ICD-10-CM | POA: Diagnosis not present

## 2016-11-01 DIAGNOSIS — E875 Hyperkalemia: Secondary | ICD-10-CM | POA: Diagnosis not present

## 2016-11-03 DIAGNOSIS — Z992 Dependence on renal dialysis: Secondary | ICD-10-CM | POA: Diagnosis not present

## 2016-11-03 DIAGNOSIS — N2581 Secondary hyperparathyroidism of renal origin: Secondary | ICD-10-CM | POA: Diagnosis not present

## 2016-11-03 DIAGNOSIS — T8612 Kidney transplant failure: Secondary | ICD-10-CM | POA: Diagnosis not present

## 2016-11-03 DIAGNOSIS — D509 Iron deficiency anemia, unspecified: Secondary | ICD-10-CM | POA: Diagnosis not present

## 2016-11-03 DIAGNOSIS — E875 Hyperkalemia: Secondary | ICD-10-CM | POA: Diagnosis not present

## 2016-11-03 DIAGNOSIS — N186 End stage renal disease: Secondary | ICD-10-CM | POA: Diagnosis not present

## 2016-11-05 ENCOUNTER — Ambulatory Visit (INDEPENDENT_AMBULATORY_CARE_PROVIDER_SITE_OTHER): Payer: Medicare Other

## 2016-11-05 DIAGNOSIS — Z5181 Encounter for therapeutic drug level monitoring: Secondary | ICD-10-CM

## 2016-11-05 DIAGNOSIS — Z9861 Coronary angioplasty status: Secondary | ICD-10-CM

## 2016-11-05 DIAGNOSIS — I483 Typical atrial flutter: Secondary | ICD-10-CM

## 2016-11-05 DIAGNOSIS — N2581 Secondary hyperparathyroidism of renal origin: Secondary | ICD-10-CM | POA: Diagnosis not present

## 2016-11-05 DIAGNOSIS — I4892 Unspecified atrial flutter: Secondary | ICD-10-CM | POA: Diagnosis not present

## 2016-11-05 DIAGNOSIS — I2510 Atherosclerotic heart disease of native coronary artery without angina pectoris: Secondary | ICD-10-CM

## 2016-11-05 DIAGNOSIS — I4891 Unspecified atrial fibrillation: Secondary | ICD-10-CM

## 2016-11-05 DIAGNOSIS — Z7901 Long term (current) use of anticoagulants: Secondary | ICD-10-CM | POA: Diagnosis not present

## 2016-11-05 DIAGNOSIS — E875 Hyperkalemia: Secondary | ICD-10-CM | POA: Diagnosis not present

## 2016-11-05 DIAGNOSIS — I214 Non-ST elevation (NSTEMI) myocardial infarction: Secondary | ICD-10-CM

## 2016-11-05 DIAGNOSIS — N186 End stage renal disease: Secondary | ICD-10-CM | POA: Diagnosis not present

## 2016-11-05 LAB — POCT INR: INR: 3.3

## 2016-11-08 DIAGNOSIS — N2581 Secondary hyperparathyroidism of renal origin: Secondary | ICD-10-CM | POA: Diagnosis not present

## 2016-11-08 DIAGNOSIS — E875 Hyperkalemia: Secondary | ICD-10-CM | POA: Diagnosis not present

## 2016-11-08 DIAGNOSIS — N186 End stage renal disease: Secondary | ICD-10-CM | POA: Diagnosis not present

## 2016-11-10 DIAGNOSIS — E875 Hyperkalemia: Secondary | ICD-10-CM | POA: Diagnosis not present

## 2016-11-10 DIAGNOSIS — N186 End stage renal disease: Secondary | ICD-10-CM | POA: Diagnosis not present

## 2016-11-10 DIAGNOSIS — N2581 Secondary hyperparathyroidism of renal origin: Secondary | ICD-10-CM | POA: Diagnosis not present

## 2016-11-11 ENCOUNTER — Ambulatory Visit (HOSPITAL_BASED_OUTPATIENT_CLINIC_OR_DEPARTMENT_OTHER): Payer: Medicare Other

## 2016-11-11 ENCOUNTER — Encounter (HOSPITAL_COMMUNITY)
Admission: RE | Disposition: A | Discharge status: Home / Self Care | Payer: Self-pay | Source: Ambulatory Visit | Attending: Internal Medicine

## 2016-11-11 ENCOUNTER — Ambulatory Visit (HOSPITAL_COMMUNITY): Payer: Medicare Other | Admitting: Anesthesiology

## 2016-11-11 ENCOUNTER — Encounter (HOSPITAL_COMMUNITY): Payer: Self-pay

## 2016-11-11 ENCOUNTER — Encounter (HOSPITAL_COMMUNITY): Payer: Self-pay | Admitting: Nurse Practitioner

## 2016-11-11 ENCOUNTER — Ambulatory Visit (INDEPENDENT_AMBULATORY_CARE_PROVIDER_SITE_OTHER): Payer: Medicare Other | Admitting: *Deleted

## 2016-11-11 ENCOUNTER — Ambulatory Visit (HOSPITAL_COMMUNITY)
Admission: RE | Admit: 2016-11-11 | Discharge: 2016-11-11 | Disposition: A | Discharge status: Home / Self Care | Payer: Medicare Other | Source: Ambulatory Visit | Attending: Internal Medicine | Admitting: Internal Medicine

## 2016-11-11 ENCOUNTER — Other Ambulatory Visit: Payer: Self-pay

## 2016-11-11 ENCOUNTER — Ambulatory Visit (HOSPITAL_BASED_OUTPATIENT_CLINIC_OR_DEPARTMENT_OTHER)
Admission: RE | Admit: 2016-11-11 | Discharge: 2016-11-11 | Disposition: A | Discharge status: Home / Self Care | Payer: Medicare Other | Source: Ambulatory Visit | Attending: Nurse Practitioner | Admitting: Nurse Practitioner

## 2016-11-11 VITALS — BP 92/62 | HR 108 | Ht 69.0 in

## 2016-11-11 DIAGNOSIS — Z888 Allergy status to other drugs, medicaments and biological substances status: Secondary | ICD-10-CM | POA: Diagnosis not present

## 2016-11-11 DIAGNOSIS — I483 Typical atrial flutter: Secondary | ICD-10-CM

## 2016-11-11 DIAGNOSIS — I4892 Unspecified atrial flutter: Secondary | ICD-10-CM

## 2016-11-11 DIAGNOSIS — N186 End stage renal disease: Secondary | ICD-10-CM | POA: Insufficient documentation

## 2016-11-11 DIAGNOSIS — I480 Paroxysmal atrial fibrillation: Secondary | ICD-10-CM | POA: Diagnosis not present

## 2016-11-11 DIAGNOSIS — G4730 Sleep apnea, unspecified: Secondary | ICD-10-CM | POA: Diagnosis not present

## 2016-11-11 DIAGNOSIS — Z9889 Other specified postprocedural states: Secondary | ICD-10-CM | POA: Diagnosis not present

## 2016-11-11 DIAGNOSIS — Z87891 Personal history of nicotine dependence: Secondary | ICD-10-CM | POA: Insufficient documentation

## 2016-11-11 DIAGNOSIS — I4891 Unspecified atrial fibrillation: Secondary | ICD-10-CM

## 2016-11-11 DIAGNOSIS — I739 Peripheral vascular disease, unspecified: Secondary | ICD-10-CM | POA: Diagnosis not present

## 2016-11-11 DIAGNOSIS — Z8269 Family history of other diseases of the musculoskeletal system and connective tissue: Secondary | ICD-10-CM | POA: Insufficient documentation

## 2016-11-11 DIAGNOSIS — Z992 Dependence on renal dialysis: Secondary | ICD-10-CM | POA: Diagnosis not present

## 2016-11-11 DIAGNOSIS — D696 Thrombocytopenia, unspecified: Secondary | ICD-10-CM | POA: Diagnosis not present

## 2016-11-11 DIAGNOSIS — Z836 Family history of other diseases of the respiratory system: Secondary | ICD-10-CM | POA: Insufficient documentation

## 2016-11-11 DIAGNOSIS — I471 Supraventricular tachycardia: Secondary | ICD-10-CM | POA: Insufficient documentation

## 2016-11-11 DIAGNOSIS — Z880 Allergy status to penicillin: Secondary | ICD-10-CM | POA: Insufficient documentation

## 2016-11-11 DIAGNOSIS — Z951 Presence of aortocoronary bypass graft: Secondary | ICD-10-CM | POA: Diagnosis not present

## 2016-11-11 DIAGNOSIS — I2510 Atherosclerotic heart disease of native coronary artery without angina pectoris: Secondary | ICD-10-CM | POA: Insufficient documentation

## 2016-11-11 DIAGNOSIS — Z8249 Family history of ischemic heart disease and other diseases of the circulatory system: Secondary | ICD-10-CM | POA: Insufficient documentation

## 2016-11-11 DIAGNOSIS — Z5181 Encounter for therapeutic drug level monitoring: Secondary | ICD-10-CM | POA: Diagnosis not present

## 2016-11-11 DIAGNOSIS — Z79899 Other long term (current) drug therapy: Secondary | ICD-10-CM | POA: Diagnosis not present

## 2016-11-11 DIAGNOSIS — I214 Non-ST elevation (NSTEMI) myocardial infarction: Secondary | ICD-10-CM | POA: Diagnosis not present

## 2016-11-11 DIAGNOSIS — Z953 Presence of xenogenic heart valve: Secondary | ICD-10-CM | POA: Insufficient documentation

## 2016-11-11 DIAGNOSIS — I252 Old myocardial infarction: Secondary | ICD-10-CM | POA: Insufficient documentation

## 2016-11-11 DIAGNOSIS — I4510 Unspecified right bundle-branch block: Secondary | ICD-10-CM | POA: Diagnosis not present

## 2016-11-11 DIAGNOSIS — Z7901 Long term (current) use of anticoagulants: Secondary | ICD-10-CM | POA: Insufficient documentation

## 2016-11-11 DIAGNOSIS — Z9861 Coronary angioplasty status: Secondary | ICD-10-CM | POA: Diagnosis not present

## 2016-11-11 DIAGNOSIS — Z9981 Dependence on supplemental oxygen: Secondary | ICD-10-CM | POA: Diagnosis not present

## 2016-11-11 DIAGNOSIS — Z91048 Other nonmedicinal substance allergy status: Secondary | ICD-10-CM | POA: Diagnosis not present

## 2016-11-11 DIAGNOSIS — Z940 Kidney transplant status: Secondary | ICD-10-CM | POA: Diagnosis not present

## 2016-11-11 DIAGNOSIS — G35 Multiple sclerosis: Secondary | ICD-10-CM | POA: Insufficient documentation

## 2016-11-11 DIAGNOSIS — Z833 Family history of diabetes mellitus: Secondary | ICD-10-CM | POA: Insufficient documentation

## 2016-11-11 DIAGNOSIS — J9610 Chronic respiratory failure, unspecified whether with hypoxia or hypercapnia: Secondary | ICD-10-CM | POA: Insufficient documentation

## 2016-11-11 DIAGNOSIS — G4733 Obstructive sleep apnea (adult) (pediatric): Secondary | ICD-10-CM | POA: Diagnosis not present

## 2016-11-11 DIAGNOSIS — I120 Hypertensive chronic kidney disease with stage 5 chronic kidney disease or end stage renal disease: Secondary | ICD-10-CM | POA: Diagnosis not present

## 2016-11-11 HISTORY — PX: CARDIOVERSION: SHX1299

## 2016-11-11 HISTORY — PX: TEE WITHOUT CARDIOVERSION: SHX5443

## 2016-11-11 LAB — CBC
HCT: 42.9 % (ref 39.0–52.0)
Hemoglobin: 13.8 g/dL (ref 13.0–17.0)
MCH: 35.7 pg — AB (ref 26.0–34.0)
MCHC: 32.2 g/dL (ref 30.0–36.0)
MCV: 110.9 fL — ABNORMAL HIGH (ref 78.0–100.0)
PLATELETS: 126 10*3/uL — AB (ref 150–400)
RBC: 3.87 MIL/uL — ABNORMAL LOW (ref 4.22–5.81)
RDW: 19.1 % — AB (ref 11.5–15.5)
WBC: 6.2 10*3/uL (ref 4.0–10.5)

## 2016-11-11 LAB — BASIC METABOLIC PANEL
Anion gap: 13 (ref 5–15)
BUN: 31 mg/dL — AB (ref 6–20)
CALCIUM: 7.5 mg/dL — AB (ref 8.9–10.3)
CO2: 29 mmol/L (ref 22–32)
CREATININE: 6.28 mg/dL — AB (ref 0.61–1.24)
Chloride: 98 mmol/L — ABNORMAL LOW (ref 101–111)
GFR, EST AFRICAN AMERICAN: 10 mL/min — AB (ref >60)
GFR, EST NON AFRICAN AMERICAN: 9 mL/min — AB (ref >60)
Glucose, Bld: 84 mg/dL (ref 65–99)
Potassium: 4.6 mmol/L (ref 3.5–5.1)
SODIUM: 140 mmol/L (ref 135–145)

## 2016-11-11 LAB — POCT INR: INR: 3.5

## 2016-11-11 SURGERY — CARDIOVERSION
Anesthesia: General

## 2016-11-11 MED ORDER — LIDOCAINE VISCOUS 2 % MT SOLN
OROMUCOSAL | Status: AC
  Filled 2016-11-11: qty 15

## 2016-11-11 MED ORDER — PROPOFOL 10 MG/ML IV BOLUS
INTRAVENOUS | Status: DC | PRN
  Administered 2016-11-11: 30 mg via INTRAVENOUS

## 2016-11-11 MED ORDER — SODIUM CHLORIDE 0.9 % IV SOLN
INTRAVENOUS | Status: DC | PRN
  Administered 2016-11-11: 11:00:00 via INTRAVENOUS

## 2016-11-11 MED ORDER — PHENYLEPHRINE HCL 10 MG/ML IJ SOLN
INTRAMUSCULAR | Status: DC | PRN
  Administered 2016-11-11: 40 ug/min via INTRAVENOUS

## 2016-11-11 MED ORDER — LIDOCAINE VISCOUS 2 % MT SOLN
OROMUCOSAL | Status: DC | PRN
  Administered 2016-11-11: 1 via OROMUCOSAL

## 2016-11-11 MED ORDER — PROPOFOL 500 MG/50ML IV EMUL
INTRAVENOUS | Status: DC | PRN
  Administered 2016-11-11: 75 ug/kg/min via INTRAVENOUS

## 2016-11-11 MED ORDER — SODIUM CHLORIDE 0.9 % IV SOLN: INTRAVENOUS | Status: DC

## 2016-11-11 NOTE — Op Note (Signed)
Full report to follow in CV section of chart.

## 2016-11-11 NOTE — Progress Notes (Signed)
Pt in for pre dccv labs and EKG.  EKG to be reviewed by [name], NP.

## 2016-11-11 NOTE — Progress Notes (Deleted)
  Echocardiogram 2D Echocardiogram has been performed.  [name] 11/11/2016, 12:19 PM

## 2016-11-11 NOTE — Anesthesia Preprocedure Evaluation (Signed)
Anesthesia Evaluation  Patient identified by MRN, date of birth, ID band Patient awake    Reviewed: Allergy & Precautions, NPO status , Patient's Chart, lab work & pertinent test results  Airway Mallampati: II  TM Distance: >3 FB Neck ROM: Full    Dental no notable dental hx.    Pulmonary sleep apnea , COPD, former smoker,  Nocturnal hypoxemia   Pulmonary exam normal breath sounds clear to auscultation       Cardiovascular hypertension, + CAD, + Past MI, + CABG and +CHF  Normal cardiovascular exam+ dysrhythmias Atrial Fibrillation  Rhythm:Regular Rate:Normal  a. 2D Echo 03/25/16: mild LVH, EF 50-55%, grade 2 Dd, AVR present with mild AI, mod MR, severely dilated LA, mildly dilated RV, mod RAE, PASP 72.   Neuro/Psych MS  Neuromuscular disease negative psych ROS   GI/Hepatic negative GI ROS, Neg liver ROS,   Endo/Other  negative endocrine ROS  Renal/GU DialysisRenal disease  negative genitourinary   Musculoskeletal negative musculoskeletal ROS (+)   Abdominal   Peds negative pediatric ROS (+)  Hematology negative hematology ROS (+)   Anesthesia Other Findings   Reproductive/Obstetrics negative OB ROS                             Anesthesia Physical Anesthesia Plan  ASA: IV  Anesthesia Plan: General   Post-op Pain Management:    Induction: Intravenous  Airway Management Planned: Mask  Additional Equipment:   Intra-op Plan:   Post-operative Plan: Extubation in OR  Informed Consent: I have reviewed the patients History and Physical, chart, labs and discussed the procedure including the risks, benefits and alternatives for the proposed anesthesia with the patient or authorized representative who has indicated his/her understanding and acceptance.   Dental advisory given  Plan Discussed with: CRNA and Surgeon  Anesthesia Plan Comments:         Anesthesia Quick  Evaluation

## 2016-11-11 NOTE — Patient Instructions (Signed)
Your physician has recommended you make the following change in your medication:  1)Decrease amiodarone to '200mg'$  once a day

## 2016-11-11 NOTE — H&P (View-Only) (Signed)
Primary Care Physician: Teressa Lower, MD Referring Physician: Cecilie Kicks, NP  Cardiologist: Dr. Julianne Handler, MD   Woodard Perrell is a 61 y.o. 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 He was placed on amiodarone and ultimately underwent DCCV on 07/12/16 with return to NSR. It was recommended he continue amiodarone '400mg'$  BID x 2 weeks, then '200mg'$  BID x 2 weeks, then '200mg'$  daily. He also went home requiring home oxygen at rest as well as with ambulation and at night.Marland Kitchen Hospital course otherwise notable for hypotension during most of his stay which improved post-DCCV. Labs notable for normal TSH, Hgb 11.2, LFTs OK  He was seen by Cecilie Kicks 1/5 for c/o  faster HR and he had not been feeling well from the day before. His HR was elevated in dialysis and BP stable. Currently feeling well.  But was concerned about being out of rhythm. EKG with RBBB QTC 513 ms in afib with RVR.  Also please note his celexa was stopped on amiodarone due to prolonging QTC but they have been concerned that his anxiety would return so taking 10 mg daily.Welbutrin 150 mg BID was added. Amiodarone was increased to 200 mg bid.  He is in the afib clinic for f/u. He is in SR. He states that he felt terrible over the weekend just being placed on wellbutrin and  coming off celexa, and havind amiodarone increased.Marland Kitchen He did not know what was making him feel bad so he stopped wellbutrin and decreased amiodarone back to 200 mg a day. He really would like to be back on clelexa. I reviewed his ekg's and when he is in SR he qtc is in range. With his BBB, and in afib his QTC is prolonged which not a true reading. Can tolerate a longer qtc on amiodarone up to 600 ms. I also discussed with pharmacist and Dr. Rayann Heman and is ok for pt to resume celexa but will restart at 10 mg and check EKG in one week .  Pt is in the afib clinic 2/21 for c/o of elevated heart rate since last week with low blood pressures and has not been able to take metoprolol to help with tachycardia. He did not feel well over the weekend but feels decent today. He continues with dialysis M/W/F. EKG shows 2:1 atrial flutter. He has been taking an extra 200 mg  Amiodarone in the pm since last Friday.  Today, he denies symptoms of palpitations, chest pain, shortness of breath, orthopnea, PND, lower extremity edema, dizziness, presyncope, syncope, or neurologic sequela. The patient is tolerating medications without difficulties and is otherwise without complaint today.   Past Medical History:  Diagnosis Date  . Acute blood loss anemia    a. 02/2016 due to groin hematoma. Rec 3 U PRBC.  Marland Kitchen Anemia   . Aortic heart valve prolapse 04/2013   a. s/p bioprosthetic  AVR at time of CABG.  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 Bifascicular block   . CAD (coronary artery disease) 04/2013   a. 04/2013: s/p CABG x 2 (Y SVG -LAD & D2). b. 01/2015: NSTEMI s/p DES to LCx, BMS to SVG-LAD. c. NSTEMI 05/2015 during AF/AFL - non-flow limiting FFR. d. Low risk nuc 3/17. e. STEMI 6/17 after coming off Plavix, s/p DES to SVG-LAD into native vessel.  . Carotid artery disease ([place])    a. 1-39% stenosis bilaterally in 06/2015.   Marland Kitchen Chronic respiratory failure ([place])   . 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 ESRD from  membranous GN and started HD in 2000. b. He had a renal Tx from 2008 to 2011, but subsequent rejection - Gets HD in [place], [place] on MWF schedule.    . Hematoma    a. Large right groin hematoma after cath 02/2016 with associated ABL anemia.  . Hypertension   . Multiple sclerosis ([place])   . Nocturnal hypoxemia   . On home oxygen therapy    pt states he has not been wearing it  . PAF (paroxysmal atrial fibrillation) ([place])    a. h/o, placed on amiodarone 07/2016 due to recurrence.  . Paroxysmal atrial flutter ([place])    a. During 05/2015 admission - SVT, atrial flutter, and PAF.  Marland Kitchen Peripheral vascular disease ([place])    Cath in 01/2015 required 45 cm [place]  . Renal transplant failure and rejection   . 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 intolerant of bipap.  Marland Kitchen SVT (supraventricular tachycardia) ([place])   . Valvular heart disease    a. 2D Echo 03/25/16: mild LVH, EF 50-55%, grade 2 Dd, AVR present with mild AI, mod MR, severely dilated LA, mildly dilated RV, mod RAE, PASP 72.   Past Surgical History:  Procedure Laterality Date  . AV FISTULA PLACEMENT    . CARDIAC CATHETERIZATION N/A 05/26/2015   Procedure: Left Heart Cath and Coronary Angiography;  Surgeon: Leonie Man, MD;  Location: [place] [facility] LAB;  Service: Cardiovascular;  Laterality: N/A;  . CARDIAC CATHETERIZATION N/A 05/26/2015   Procedure: Intravascular Pressure Wire/FFR Study;  Surgeon: Leonie Man, MD;  Location: [place];  Service: Cardiovascular;  Laterality: N/A;  . CARDIAC CATHETERIZATION N/A 02/15/2016   Procedure: Left Heart Cath and Coronary Angiography;  Surgeon: Wellington Hampshire, MD;  Location: [facility] [facility];  Service: Cardiovascular;  Laterality: N/A;  . CARDIOVERSION N/A 07/12/2016   Procedure: CARDIOVERSION;  Surgeon: Minus Breeding, MD;  Location[facility] ENDOSCOPY;  Service: Cardiovascular;  Laterality: N/A;  . CORONARY ARTERY BYPASS GRAFT    . excised  squamous cells at rectum  2006  . flash     flash pulmonary edema  . HERNIA REPAIR  07/2011  . KIDNEY TRANSPLANT  08/2007  . LEFT HEART CATHETERIZATION WITH CORONARY ANGIOGRAM N/A 01/09/2013   Procedure: LEFT HEART CATHETERIZATION WITH CORONARY ANGIOGRAM;  Surgeon: Burnell Blanks, MD;  Location: [facility] CATH LAB;  Service: Cardiovascular;  Laterality: N/A;    Current Outpatient Prescriptions  Medication Sig Dispense Refill  . acetaminophen (TYLENOL) 500 MG tablet Take 1,000 mg by mouth every 6 (six) hours as needed for pain or fever.    Marland Kitchen albuterol (PROAIR HFA) 108 (90 BASE) MCG/ACT inhaler Inhale 1-2 puffs into the lungs every 6 (six) hours as needed for shortness  of breath.     Marland Kitchen amiodarone (PACERONE) 200 MG tablet Take 1 tablet (200 mg total) by mouth daily. (Patient taking differently: Take 200 mg by mouth 2 (two) times daily. ) 90 tablet 3  . atorvastatin (LIPITOR) 40 MG tablet Take 20 mg by mouth daily.     . calcium acetate (PHOSLO) 667 MG capsule Take 4,002 mg by mouth 3 (three) times daily with meals.     . citalopram (CELEXA) 20 MG tablet Take 20 mg by mouth daily.    . clopidogrel (PLAVIX) 75 MG tablet Take 1 tablet (75 mg total) by mouth daily. 90 tablet 3  . docusate sodium (COLACE) 100 MG capsule Take 300 mg by mouth 2 (two) times daily.    . folic acid (FOLVITE) 1 MG tablet Take 400 mcg by mouth daily.     Marland Kitchen gabapentin (NEURONTIN) 300 MG capsule Take 300 mg by mouth at bedtime.     . midodrine (PROAMATINE) 10 MG tablet Take 1 tablet (10 mg total) by mouth every Monday, Wednesday, and Friday with hemodialysis.    Marland Kitchen multivitamin (RENA-VIT) TABS tablet Take 1 tablet by mouth daily. 30 tablet 1  . nitroGLYCERIN (NITROSTAT) 0.4 MG SL tablet Place 0.4 mg under the tongue every 5 (five) minutes as needed for chest pain (Take one tabe every 5-10 minutes for chest pain. Once you have taken three tabs you need to notify your MD or go to ER if chest pain not gone.).    Marland Kitchen OXYGEN 2lpm as  needed    . ranitidine (ZANTAC) 150 MG tablet Take 150 mg by mouth daily.    . sodium polystyrene (KAYEXALATE) 15 GM/60ML suspension Take 15 g by mouth See admin instructions. Only take 15 g on Sun / Tues / Thurs / Sat (non dialysis days)    . UNABLE TO FIND Med Name: BIPAP    . warfarin (COUMADIN) 7.5 MG tablet Take 1/2-1 tablet by mouth daily as directed by coumadin clinic 30 tablet 1  . metoprolol tartrate (LOPRESSOR) 25 MG tablet Take 0.5 tablets (12.5 mg total) by mouth 2 (two) times daily. (Patient not taking: Reported on 10/27/2016) 180 tablet 3   No current facility-administered medications for this encounter.     Allergies  Allergen Reactions  . Diphenhydramine Itching    Only with IV doses. Tolerates oral.  . Penicillins Other (See Comments)    migraine  . Adhesive [Tape] Rash    Please use paper tape  . Amoxicillin Rash    migraine  . Ativan [Lorazepam] Anxiety  . Other Rash    Please use paper tape    Social History   Social History  . Marital status: Married    Spouse name: N/A  . Number of children: N/A  . Years of education: N/A   Occupational History  . Disabled    Social History Main Topics  . Smoking status: Former Smoker    Packs/day: 2.00    Years: 20.00    Types: Cigarettes    Quit date: 08/06/1998  . Smokeless tobacco: Never Used  . Alcohol use No  . Drug use: No  . Sexual activity: Not on file   Other Topics Concern  . Not on file   Social History Narrative   No history of premature CAD in either parents or siblings. However, his maternal grandfather and 2 maternal uncles had coronary artery disease.    Family History  Problem Relation Age of Onset  . Heart failure Mother  started in 69s  . Emphysema Mother     smoked  . Diabetes Father   . Lupus Sister   . Heart attack Maternal Grandfather   . Heart attack Maternal Uncle   . Heart attack Maternal Aunt   . Hypertension Maternal Aunt   . Stroke Neg Hx     ROS- All systems are  reviewed and negative except as per the HPI above  Physical Exam: Vitals:   10/27/16 1432  BP: 100/68  Pulse: (!) 117  Weight: 177 lb (80.3 kg)  Height: '5\' 9"'$  (1.753 m)   Wt Readings from Last 3 Encounters:  10/27/16 177 lb (80.3 kg)  09/30/16 182 lb 12.8 oz (82.9 kg)  09/27/16 181 lb (82.1 kg)    Labs: Lab Results  Component Value Date   NA 140 09/27/2016   K 3.7 09/27/2016   CL 100 (L) 09/27/2016   CO2 30 09/27/2016   GLUCOSE 95 09/27/2016   BUN 23 (H) 09/27/2016   CREATININE 5.67 (H) 09/27/2016   CALCIUM 7.6 (L) 09/27/2016   PHOS 7.4 (H) 07/12/2016   MG 2.2 07/11/2016   Lab Results  Component Value Date   INR 3.1 10/27/2016   Lab Results  Component Value Date   CHOL 98 (L) 03/05/2016   HDL 32 (L) 03/05/2016   LDLCALC 37 03/05/2016   TRIG 143 03/05/2016     GEN- The patient is well appearing, alert and oriented x 3 today.   Head- normocephalic, atraumatic Eyes-  Sclera clear, conjunctiva pink Ears- hearing intact Oropharynx- clear Neck- supple, no JVP Lymph- no cervical lymphadenopathy Lungs- Clear to ausculation bilaterally, normal work of breathing Heart- Rapid,regular rate and rhythm, no murmurs, rubs or gallops, PMI not laterally displaced GI- soft, NT, ND, + BS Extremities- no clubbing, cyanosis, or edema MS- no significant deformity or atrophy Skin- no rash or lesion Psych- euthymic mood, full affect Neuro- strength and sensation are intact  EKG-atrial flutter 2:1 with RBBB, qrs int 150 ms, qtc 580m Epic records reviewed    Assessment and Plan: 1. Paroxysmal afib Currently back in SR Continue amiodarone at 200 mg bid Take metoprolol as BP will allow, currently with hypotension 2/2 dialysis Will scheduled for TEE guided DCCV, due for INR today Continue warfarin Continue HD  F/u in one week after cardioversion   DButch PennyC. Javarion Douty, AFinney Hospital[address]GDelmar Porter [phone]

## 2016-11-11 NOTE — Anesthesia Procedure Notes (Signed)
Procedure Name: MAC Date/Time: 11/11/2016 11:15 AM Performed by: Izora Gala Pre-anesthesia Checklist: Patient identified, Suction available, Emergency Drugs available and Patient being monitored Patient Re-evaluated:Patient Re-evaluated prior to inductionOxygen Delivery Method: Simple face mask Preoxygenation: Pre-oxygenation with 100% oxygen Intubation Type: IV induction Placement Confirmation: positive ETCO2 Dental Injury: Teeth and Oropharynx as per pre-operative assessment

## 2016-11-11 NOTE — Interval H&P Note (Signed)
History and Physical Interval Note:  11/11/2016 10:39 AM  [name]  has presented today for surgery, with the diagnosis of afib  The various methods of treatment have been discussed with the patient and family. After consideration of risks, benefits and other options for treatment, the patient has consented to  Procedure(s): CARDIOVERSION (N/A) TRANSESOPHAGEAL ECHOCARDIOGRAM (TEE) (N/A) as a surgical intervention .  The patient's history has been reviewed, patient examined, no change in status, stable for surgery.  I have reviewed the patient's chart and labs.  Questions were answered to the patient's satisfaction.     Dorris Carnes

## 2016-11-11 NOTE — Anesthesia Postprocedure Evaluation (Signed)
Anesthesia Post Note  Patient: Maliik Karner  Procedure(s) Performed: Procedure(s) (LRB): CARDIOVERSION (N/A) TRANSESOPHAGEAL ECHOCARDIOGRAM (TEE) (N/A)  Patient location during evaluation: PACU Anesthesia Type: General Level of consciousness: awake and alert Pain management: pain level controlled Vital Signs Assessment: post-procedure vital signs reviewed and stable Respiratory status: spontaneous breathing, nonlabored ventilation, respiratory function stable and patient connected to nasal cannula oxygen Cardiovascular status: blood pressure returned to baseline and stable Postop Assessment: no signs of nausea or vomiting Anesthetic complications: no       Last Vitals:  Vitals:   11/11/16 1155 11/11/16 1200  BP: (!) 107/37 (!) 103/41  Pulse: 63 62  Resp: (!) 27 16  Temp: 36.5 C     Last Pain:  Vitals:   11/11/16 1155  TempSrc: Oral                 Moria Brophy S

## 2016-11-11 NOTE — Transfer of Care (Signed)
Immediate Anesthesia Transfer of Care Note  Patient: [name]  Procedure(s) Performed: Procedure(s): CARDIOVERSION (N/A) TRANSESOPHAGEAL ECHOCARDIOGRAM (TEE) (N/A)  Patient Location: PACU and Endoscopy Unit  Anesthesia Type:General  Level of Consciousness: awake, alert , oriented and patient cooperative  Airway & Oxygen Therapy: Patient Spontanous Breathing and Patient connected to face mask oxygen  Post-op Assessment: Report given to RN, Post -op Vital signs reviewed and stable, Patient moving all extremities and Patient moving all extremities X 4  Post vital signs: Reviewed and stable  Last Vitals:  Vitals:   11/11/16 1045 11/11/16 1155  BP: 100/77 (!) 107/37  Pulse: (!) 109 63  Resp: 15 (!) 27  Temp:  36.5 C    Last Pain:  Vitals:   11/11/16 1155  TempSrc: Oral         Complications: No apparent anesthesia complications

## 2016-11-11 NOTE — Progress Notes (Signed)
Primary Care Physician: Teressa Lower, MD Referring Physician: Cecilie Kicks, NP  Cardiologist: Dr. Julianne Handler, MD   [name] is a 62 y.o. 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 He was placed on amiodarone and ultimately underwent DCCV on 07/12/16 with return to NSR. It was recommended he continue amiodarone '400mg'$  BID x 2 weeks, then '200mg'$  BID x 2 weeks, then '200mg'$  daily. He also went home requiring home oxygen at rest as well as with ambulation and at night.Marland Kitchen Hospital course otherwise notable for hypotension during most of his stay which improved post-DCCV. Labs notable for normal TSH, Hgb 11.2, LFTs OK  He was seen by Cecilie Kicks 1/5 for c/o  faster HR and he had not been feeling well from the day before. His HR was elevated in dialysis and BP stable. Currently feeling well.  But was concerned about being out of rhythm. EKG with RBBB QTC 513 ms in afib with RVR.  Also please note his celexa was stopped on amiodarone due to prolonging QTC but they have been concerned that his anxiety would return so taking 10 mg daily.Welbutrin 150 mg BID was added. Amiodarone was increased to 200 mg bid.  He is in the afib clinic for f/u. He is in SR. He states that he felt terrible over the weekend just being placed on wellbutrin and  coming off celexa, and havind amiodarone increased.Marland Kitchen He did not know what was making him feel bad so he stopped wellbutrin and decreased amiodarone back to 200 mg a day. He really would like to be back on clelexa. I reviewed his ekg's and when he is in SR he qtc is in range. With his BBB, and in afib his QTC is prolonged which not a true reading. Can tolerate a longer qtc on amiodarone up to 600 ms. I also discussed with pharmacist and Dr. Rayann Heman and is ok for pt to resume celexa but will restart at 10 mg and check EKG in one week .  Pt is in the afib clinic 2/21 for c/o of elevated heart rate since last week with low blood pressures and has not been able to take metoprolol to help with tachycardia. He did not feel well over the weekend but feels decent today. He continues with dialysis M/W/F. EKG shows 2:1 atrial flutter. He has been taking an extra 200 mg  Amiodarone in the pm since last Friday.  F/u in afib clinc 11/11/16. He continues in aflutter. His BP readings at dialysis have been low since being in irregular rhythm. He has noted some blurred vision and amioderone will be decreased back to 200 mg a day from 400 mg a day.  He is pending a TEE guided cardioversion today but he states that he drank coffee with creamer at 5:30 am.   Today, he denies symptoms of palpitations, chest pain, shortness of breath,  orthopnea, PND, lower extremity edema, dizziness, presyncope, syncope, or neurologic sequela. The patient is tolerating medications without difficulties and is otherwise without complaint today.   Past Medical History:  Diagnosis Date  . Acute blood loss anemia    a. 02/2016 due to groin hematoma. Rec 3 U PRBC.  Marland Kitchen Anemia   . Aortic heart valve prolapse 04/2013   a. s/p bioprosthetic AVR at time of CABG.  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 Bifascicular block   . CAD (coronary artery disease) 04/2013   a. 04/2013: s/p CABG x 2 (Y SVG -LAD & D2). b. 01/2015: NSTEMI s/p DES to  LCx, BMS to SVG-LAD. c. NSTEMI 05/2015 during AF/AFL - non-flow limiting FFR. d. Low risk nuc 3/17. e. STEMI 6/17 after coming off Plavix, s/p DES to SVG-LAD into native vessel.  . Carotid artery disease ([place])    a. 1-39% stenosis bilaterally in 06/2015.   Marland Kitchen Chronic respiratory failure ([place])   . 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 b. He had a renal Tx from 2008 to 2011, but subsequent rejection - Gets HD in [place], [place] on MWF schedule.    . Hematoma    a. Large right groin hematoma after cath 02/2016 with associated ABL anemia.  . Hypertension   . Multiple sclerosis ([place])   . Nocturnal hypoxemia   . On home oxygen therapy    pt states he has not been wearing it  . PAF (paroxysmal atrial fibrillation) ([place])    a. h/o, placed on amiodarone 07/2016 due to recurrence.  . Paroxysmal atrial flutter ([place])    a. During 05/2015 admission - SVT, atrial flutter, and PAF.  Marland Kitchen Peripheral vascular disease ([place])    Cath in 01/2015 required 45 cm [facility] [place]  . Renal transplant failure and rejection   . 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 intolerant of bipap.  Marland Kitchen SVT (supraventricular tachycardia) ([place])   . Valvular heart disease    a. 2D Echo 03/25/16: mild LVH, EF 50-55%, grade 2 Dd, AVR present with mild AI, mod MR, severely dilated LA, mildly dilated RV, mod RAE, PASP 72.   Past Surgical History:  Procedure Laterality Date  . AV FISTULA PLACEMENT    . CARDIAC CATHETERIZATION N/A 05/26/2015   Procedure: Left Heart Cath and Coronary Angiography;  Surgeon: Leonie Man, MD;  Location: [facility];  Service: Cardiovascular;  Laterality: N/A;  . CARDIAC CATHETERIZATION N/A 05/26/2015   Procedure: Intravascular Pressure Wire/FFR Study;  Surgeon: Leonie Man, MD;  Location: [facility];  Service: Cardiovascular;  Laterality: N/A;  . CARDIAC CATHETERIZATION N/A 02/15/2016   Procedure: Left Heart  Cath and Coronary Angiography;  Surgeon: Wellington Hampshire, MD;  Location: [facility];  Service: Cardiovascular;  Laterality: N/A;  . CARDIOVERSION N/A 07/12/2016   Procedure: CARDIOVERSION;  Surgeon: Minus Breeding, MD;  [facility] ENDOSCOPY;  Service: Cardiovascular;  Laterality: N/A;  . CORONARY ARTERY BYPASS GRAFT    . excised squamous cells at rectum  2006  . flash     flash pulmonary edema  . HERNIA REPAIR  07/2011  . KIDNEY TRANSPLANT  08/2007  . LEFT HEART CATHETERIZATION WITH CORONARY ANGIOGRAM N/A 01/09/2013   Procedure: LEFT HEART CATHETERIZATION WITH CORONARY ANGIOGRAM;  Surgeon: Burnell Blanks, MD;  Location: [facility]  CATH LAB;  Service: Cardiovascular;  Laterality: N/A;    Current Outpatient Prescriptions  Medication Sig Dispense Refill  . acetaminophen (TYLENOL) 500 MG tablet Take 1,000 mg by mouth every 6 (six) hours as needed for pain or fever.    Marland Kitchen albuterol (PROAIR HFA) 108 (90 BASE) MCG/ACT inhaler Inhale 1-2 puffs into the lungs every 6 (six) hours as needed for shortness of breath.     Marland Kitchen amiodarone (PACERONE) 200 MG tablet Take 1 tablet (200 mg total) by mouth daily. (Patient taking differently: Take 200 mg by mouth 2 (two) times daily. ) 90 tablet 3  . atorvastatin (LIPITOR) 40 MG tablet Take 20 mg by mouth daily.     . calcium acetate (PHOSLO) 667 MG capsule Take 4,002 mg by mouth 3 (three) times daily with meals.     . citalopram (CELEXA) 20 MG tablet Take 20 mg by mouth daily.    . clopidogrel (PLAVIX) 75 MG tablet Take 1 tablet (75 mg total) by mouth daily. 90 tablet 3  . docusate sodium (COLACE) 100 MG capsule Take 300 mg by mouth 2 (two) times daily.    . folic acid (FOLVITE) 1 MG tablet Take 400 mcg by mouth daily.     Marland Kitchen gabapentin (NEURONTIN) 300 MG capsule Take 300 mg by mouth at bedtime.     . metoprolol tartrate (LOPRESSOR) 25 MG tablet Take 0.5 tablets (12.5 mg total) by mouth 2 (two) times daily. (Patient not taking: Reported on 10/27/2016) 180  tablet 3  . midodrine (PROAMATINE) 10 MG tablet Take 1 tablet (10 mg total) by mouth every Monday, Wednesday, and Friday with hemodialysis.    Marland Kitchen multivitamin (RENA-VIT) TABS tablet Take 1 tablet by mouth daily. 30 tablet 1  . nitroGLYCERIN (NITROSTAT) 0.4 MG SL tablet Place 0.4 mg under the tongue every 5 (five) minutes as needed for chest pain (Take one tabe every 5-10 minutes for chest pain. Once you have taken three tabs you need to notify your MD or go to ER if chest pain not gone.).    Marland Kitchen OXYGEN 2lpm as needed    . ranitidine (ZANTAC) 150 MG tablet Take 150 mg by mouth daily.    . sodium polystyrene (KAYEXALATE) 15 GM/60ML suspension Take 15 g by mouth See admin instructions. Only take 15 g on Sun / Tues / Thurs / Sat (non dialysis days)    . UNABLE TO FIND Med Name: BIPAP    . warfarin (COUMADIN) 7.5 MG tablet Take 1/2-1 tablet by mouth daily as directed by coumadin clinic 30 tablet 1   No current facility-administered medications for this encounter.     Allergies  Allergen Reactions  . Diphenhydramine Itching    Only with IV doses. Tolerates oral.  . Penicillins Other (See Comments)    migraine  . Adhesive [Tape] Rash    Please use paper tape  . Amoxicillin Rash    migraine  . Ativan [Lorazepam] Anxiety  . Other Rash    Please use paper tape    Social History   Social History  . Marital status: Married    Spouse name: N/A  . Number of children: N/A  . Years of education: N/A   Occupational History  . Disabled    Social History Main Topics  . Smoking status: Former Smoker    Packs/day: 2.00    Years: 20.00    Types: Cigarettes    Quit date: 08/06/1998  . Smokeless tobacco: Never Used  . Alcohol use No  .  Drug use: No  . Sexual activity: Not on file   Other Topics Concern  . Not on file   Social History Narrative   No history of premature CAD in either parents or siblings. However, his maternal grandfather and 2 maternal uncles had coronary artery disease.     Family History  Problem Relation Age of Onset  . Heart failure Mother     started in 64s  . Emphysema Mother     smoked  . Diabetes Father   . Lupus Sister   . Heart attack Maternal Grandfather   . Heart attack Maternal Uncle   . Heart attack Maternal Aunt   . Hypertension Maternal Aunt   . Stroke Neg Hx     ROS- All systems are reviewed and negative except as per the HPI above  Physical Exam: There were no vitals filed for this visit. Wt Readings from Last 3 Encounters:  10/27/16 177 lb (80.3 kg)  09/30/16 182 lb 12.8 oz (82.9 kg)  09/27/16 181 lb (82.1 kg)    Labs: Lab Results  Component Value Date   NA 140 09/27/2016   K 3.7 09/27/2016   CL 100 (L) 09/27/2016   CO2 30 09/27/2016   GLUCOSE 95 09/27/2016   BUN 23 (H) 09/27/2016   CREATININE 5.67 (H) 09/27/2016   CALCIUM 7.6 (L) 09/27/2016   PHOS 7.4 (H) 07/12/2016   MG 2.2 07/11/2016   Lab Results  Component Value Date   INR 3.5 11/11/2016   Lab Results  Component Value Date   CHOL 98 (L) 03/05/2016   HDL 32 (L) 03/05/2016   LDLCALC 37 03/05/2016   TRIG 143 03/05/2016     GEN- The patient is well appearing, alert and oriented x 3 today.   Head- normocephalic, atraumatic Eyes-  Sclera clear, conjunctiva pink Ears- hearing intact Oropharynx- clear Neck- supple, no JVP Lymph- no cervical lymphadenopathy Lungs- Clear to ausculation bilaterally, normal work of breathing Heart- Rapid,regular rate and rhythm, no murmurs, rubs or gallops, PMI not laterally displaced GI- soft, NT, ND, + BS Extremities- no clubbing, cyanosis, or edema MS- no significant deformity or atrophy Skin- no rash or lesion Psych- euthymic mood, full affect Neuro- strength and sensation are intact  EKG-atrial flutter at 108 bpm, pr int 192 bpm, qrs int 154 ms, qtc 503 ms Epic records reviewed    Assessment and Plan: 1. Paroxysmal afib/flutter Continues out of rhythm with low BP readings at dialysis Reduce  amiodarone  back to 200 mg qd( some blurred vision? if from [facility]), hopefully will improve with reduction in dose, if not, amio level on return Take metoprolol as BP will allow, currently with hypotension 2/2 dialysis and is not taking Scheduled for  DCCV,( but did drink coffee with creamer this am at 5:30 a),  has had only 3 weeks of therapeutic INR with the third one this am, 2/21 3.1, 3/2 3.3 and this am at 3.5, since he is symptomatic and dialysis is being complicated with pt out of rhythm, will go ahead with TEE guided DCCV. Continue warfarin Continue HD  F/u in one week after cardioversion   Butch Penny C. Shevon Sian, [facility] [address] [place], [place] [address] [phone]

## 2016-11-11 NOTE — Discharge Instructions (Signed)
TEE  YOU HAD AN CARDIAC PROCEDURE TODAY: Refer to the procedure report and other information in the discharge instructions given to you for any specific questions about what was found during the examination. If this information does not answer your questions, please call [facility] office at [phone] to clarify.   DIET: Your first meal following the procedure should be a light meal and then it is ok to progress to your normal diet. A half-sandwich or bowl of soup is an example of a good first meal. Heavy or fried foods are harder to digest and may make you feel nauseous or bloated. Drink plenty of fluids but you should avoid alcoholic beverages for 24 hours. If you had a esophageal dilation, please see attached instructions for diet.   ACTIVITY: Your care partner should take you home directly after the procedure. You should plan to take it easy, moving slowly for the rest of the day. You can resume normal activity the day after the procedure however YOU SHOULD NOT DRIVE, use power tools, machinery or perform tasks that involve climbing or major physical exertion for 24 hours (because of the sedation medicines used during the test).   SYMPTOMS TO REPORT IMMEDIATELY: A cardiologist can be reached at any hour. Please call [phone] for any of the following symptoms:  Vomiting of blood or coffee ground material  New, significant abdominal pain  New, significant chest pain or pain under the shoulder blades  Painful or persistently difficult swallowing  New shortness of breath  Black, tarry-looking or red, bloody stools  FOLLOW UP:  Please also call with any specific questions about appointments or follow up tests.  TEE  YOU HAD AN CARDIAC PROCEDURE TODAY: Refer to the procedure report and other information in the discharge instructions given to you for any specific questions about what was found during the examination. If this information does not answer your questions, please call [facility] office at [phone] to clarify.   DIET: Your first meal following the procedure should be a light meal and then it is ok to progress to your normal diet. A half-sandwich or bowl of soup is an example of a good first meal. Heavy or fried foods are harder to digest and may make you feel nauseous or bloated. Drink plenty of fluids but you should avoid alcoholic beverages for 24 hours. If you had a esophageal dilation, please see attached instructions for diet.   ACTIVITY: Your care partner should take you home directly after the procedure. You should plan to take it easy, moving slowly for the rest of the day. You can resume normal activity the day after the procedure however YOU SHOULD NOT DRIVE, use power tools, machinery or perform tasks that involve climbing or major physical exertion for 24 hours (because of the sedation medicines used during the test).   SYMPTOMS TO REPORT IMMEDIATELY: A cardiologist can be reached at any hour. Please call [phone] for any of the following symptoms:  Vomiting of blood or coffee ground material  New, significant abdominal pain  New, significant chest pain or pain under the shoulder blades  Painful or persistently difficult swallowing  New shortness of breath  Black, tarry-looking or red, bloody stools  FOLLOW UP:  Please also call with any specific questions about appointments or follow up tests.   Electrical Cardioversion, Care After This sheet gives you information about how to care for yourself after your procedure. Your health care provider may also give you more specific instructions. If  you have problems or questions, contact your health care provider. What can I expect after the procedure? After the procedure, it is common to have:  Some redness on the skin where the shocks were given. Follow these instructions at home:  Do not drive for 24 hours if you were given a medicine to help you relax (sedative).  Take over-the-counter  and prescription medicines only as told by your health care provider.  Ask your health care provider how to check your pulse. Check it often.  Rest for 48 hours after the procedure or as told by your health care provider.  Avoid or limit your caffeine use as told by your health care provider. Contact a health care provider if:  You feel like your heart is beating too quickly or your pulse is not regular.  You have a serious muscle cramp that does not go away. Get help right away if:  You have discomfort in your chest.  You are dizzy or you feel faint.  You have trouble breathing or you are short of breath.  Your speech is slurred.  You have trouble moving an arm or leg on one side of your body.  Your fingers or toes turn cold or blue. This information is not intended to replace advice given to you by your health care provider. Make sure you discuss any questions you have with your health care provider. Document Released: 06/13/2013 Document Revised: 03/26/2016 Document Reviewed: 02/27/2016 Elsevier Interactive Patient Education  2017 [employer].

## 2016-11-12 DIAGNOSIS — E875 Hyperkalemia: Secondary | ICD-10-CM | POA: Diagnosis not present

## 2016-11-12 DIAGNOSIS — N2581 Secondary hyperparathyroidism of renal origin: Secondary | ICD-10-CM | POA: Diagnosis not present

## 2016-11-12 DIAGNOSIS — N186 End stage renal disease: Secondary | ICD-10-CM | POA: Diagnosis not present

## 2016-11-14 ENCOUNTER — Encounter (HOSPITAL_COMMUNITY): Payer: Self-pay | Admitting: Internal Medicine

## 2016-11-15 ENCOUNTER — Emergency Department (HOSPITAL_COMMUNITY): Payer: Medicare Other

## 2016-11-15 ENCOUNTER — Inpatient Hospital Stay (HOSPITAL_COMMUNITY)
Admission: EM | Admit: 2016-11-15 | Discharge: 2016-11-18 | DRG: 291 | Disposition: A | Discharge status: Home / Self Care | Payer: Medicare Other | Attending: Internal Medicine | Admitting: Internal Medicine

## 2016-11-15 ENCOUNTER — Encounter (HOSPITAL_COMMUNITY): Payer: Self-pay | Admitting: Internal Medicine

## 2016-11-15 ENCOUNTER — Telehealth: Payer: Self-pay | Admitting: Cardiovascular Disease

## 2016-11-15 DIAGNOSIS — G4733 Obstructive sleep apnea (adult) (pediatric): Secondary | ICD-10-CM | POA: Diagnosis present

## 2016-11-15 DIAGNOSIS — J101 Influenza due to other identified influenza virus with other respiratory manifestations: Secondary | ICD-10-CM | POA: Diagnosis present

## 2016-11-15 DIAGNOSIS — N2581 Secondary hyperparathyroidism of renal origin: Secondary | ICD-10-CM | POA: Diagnosis present

## 2016-11-15 DIAGNOSIS — Z888 Allergy status to other drugs, medicaments and biological substances status: Secondary | ICD-10-CM

## 2016-11-15 DIAGNOSIS — Z953 Presence of xenogenic heart valve: Secondary | ICD-10-CM | POA: Diagnosis not present

## 2016-11-15 DIAGNOSIS — J189 Pneumonia, unspecified organism: Secondary | ICD-10-CM | POA: Diagnosis present

## 2016-11-15 DIAGNOSIS — Z992 Dependence on renal dialysis: Secondary | ICD-10-CM | POA: Diagnosis not present

## 2016-11-15 DIAGNOSIS — R778 Other specified abnormalities of plasma proteins: Secondary | ICD-10-CM | POA: Diagnosis present

## 2016-11-15 DIAGNOSIS — N052 Unspecified nephritic syndrome with diffuse membranous glomerulonephritis: Secondary | ICD-10-CM | POA: Diagnosis present

## 2016-11-15 DIAGNOSIS — T8612 Kidney transplant failure: Secondary | ICD-10-CM | POA: Diagnosis present

## 2016-11-15 DIAGNOSIS — I9589 Other hypotension: Secondary | ICD-10-CM | POA: Diagnosis not present

## 2016-11-15 DIAGNOSIS — R05 Cough: Secondary | ICD-10-CM

## 2016-11-15 DIAGNOSIS — Z9861 Coronary angioplasty status: Secondary | ICD-10-CM

## 2016-11-15 DIAGNOSIS — Z880 Allergy status to penicillin: Secondary | ICD-10-CM

## 2016-11-15 DIAGNOSIS — Z8249 Family history of ischemic heart disease and other diseases of the circulatory system: Secondary | ICD-10-CM

## 2016-11-15 DIAGNOSIS — R748 Abnormal levels of other serum enzymes: Secondary | ICD-10-CM | POA: Diagnosis not present

## 2016-11-15 DIAGNOSIS — R7989 Other specified abnormal findings of blood chemistry: Secondary | ICD-10-CM | POA: Diagnosis present

## 2016-11-15 DIAGNOSIS — I440 Atrioventricular block, first degree: Secondary | ICD-10-CM | POA: Diagnosis present

## 2016-11-15 DIAGNOSIS — J449 Chronic obstructive pulmonary disease, unspecified: Secondary | ICD-10-CM | POA: Diagnosis present

## 2016-11-15 DIAGNOSIS — N186 End stage renal disease: Secondary | ICD-10-CM | POA: Diagnosis not present

## 2016-11-15 DIAGNOSIS — I25810 Atherosclerosis of coronary artery bypass graft(s) without angina pectoris: Secondary | ICD-10-CM | POA: Diagnosis present

## 2016-11-15 DIAGNOSIS — I959 Hypotension, unspecified: Secondary | ICD-10-CM | POA: Diagnosis not present

## 2016-11-15 DIAGNOSIS — R0602 Shortness of breath: Secondary | ICD-10-CM | POA: Diagnosis not present

## 2016-11-15 DIAGNOSIS — Z940 Kidney transplant status: Secondary | ICD-10-CM

## 2016-11-15 DIAGNOSIS — Z951 Presence of aortocoronary bypass graft: Secondary | ICD-10-CM

## 2016-11-15 DIAGNOSIS — Z7901 Long term (current) use of anticoagulants: Secondary | ICD-10-CM

## 2016-11-15 DIAGNOSIS — I10 Essential (primary) hypertension: Secondary | ICD-10-CM | POA: Diagnosis not present

## 2016-11-15 DIAGNOSIS — R059 Cough, unspecified: Secondary | ICD-10-CM

## 2016-11-15 DIAGNOSIS — I132 Hypertensive heart and chronic kidney disease with heart failure and with stage 5 chronic kidney disease, or end stage renal disease: Principal | ICD-10-CM | POA: Diagnosis present

## 2016-11-15 DIAGNOSIS — I509 Heart failure, unspecified: Secondary | ICD-10-CM

## 2016-11-15 DIAGNOSIS — I5033 Acute on chronic diastolic (congestive) heart failure: Secondary | ICD-10-CM | POA: Diagnosis present

## 2016-11-15 DIAGNOSIS — D649 Anemia, unspecified: Secondary | ICD-10-CM | POA: Diagnosis present

## 2016-11-15 DIAGNOSIS — E8889 Other specified metabolic disorders: Secondary | ICD-10-CM | POA: Diagnosis present

## 2016-11-15 DIAGNOSIS — Z7902 Long term (current) use of antithrombotics/antiplatelets: Secondary | ICD-10-CM

## 2016-11-15 DIAGNOSIS — Z9981 Dependence on supplemental oxygen: Secondary | ICD-10-CM | POA: Diagnosis not present

## 2016-11-15 DIAGNOSIS — Z87891 Personal history of nicotine dependence: Secondary | ICD-10-CM | POA: Diagnosis not present

## 2016-11-15 DIAGNOSIS — Y830 Surgical operation with transplant of whole organ as the cause of abnormal reaction of the patient, or of later complication, without mention of misadventure at the time of the procedure: Secondary | ICD-10-CM | POA: Diagnosis present

## 2016-11-15 DIAGNOSIS — I480 Paroxysmal atrial fibrillation: Secondary | ICD-10-CM | POA: Diagnosis present

## 2016-11-15 DIAGNOSIS — J9621 Acute and chronic respiratory failure with hypoxia: Secondary | ICD-10-CM | POA: Diagnosis present

## 2016-11-15 DIAGNOSIS — E8770 Fluid overload, unspecified: Secondary | ICD-10-CM

## 2016-11-15 DIAGNOSIS — E875 Hyperkalemia: Secondary | ICD-10-CM | POA: Diagnosis present

## 2016-11-15 DIAGNOSIS — G35 Multiple sclerosis: Secondary | ICD-10-CM | POA: Diagnosis present

## 2016-11-15 DIAGNOSIS — I4892 Unspecified atrial flutter: Secondary | ICD-10-CM | POA: Diagnosis present

## 2016-11-15 DIAGNOSIS — Z79899 Other long term (current) drug therapy: Secondary | ICD-10-CM

## 2016-11-15 DIAGNOSIS — I2510 Atherosclerotic heart disease of native coronary artery without angina pectoris: Secondary | ICD-10-CM | POA: Diagnosis not present

## 2016-11-15 DIAGNOSIS — D631 Anemia in chronic kidney disease: Secondary | ICD-10-CM | POA: Diagnosis not present

## 2016-11-15 DIAGNOSIS — I739 Peripheral vascular disease, unspecified: Secondary | ICD-10-CM | POA: Diagnosis present

## 2016-11-15 DIAGNOSIS — J441 Chronic obstructive pulmonary disease with (acute) exacerbation: Secondary | ICD-10-CM | POA: Diagnosis not present

## 2016-11-15 DIAGNOSIS — Z91048 Other nonmedicinal substance allergy status: Secondary | ICD-10-CM

## 2016-11-15 DIAGNOSIS — J9622 Acute and chronic respiratory failure with hypercapnia: Secondary | ICD-10-CM

## 2016-11-15 DIAGNOSIS — J9601 Acute respiratory failure with hypoxia: Secondary | ICD-10-CM | POA: Diagnosis not present

## 2016-11-15 LAB — I-STAT VENOUS BLOOD GAS, ED
Acid-Base Excess: 2 mmol/L (ref 0.0–2.0)
Bicarbonate: 28.1 mmol/L — ABNORMAL HIGH (ref 20.0–28.0)
O2 SAT: 70 %
PCO2 VEN: 47.8 mmHg (ref 44.0–60.0)
PO2 VEN: 38 mmHg (ref 32.0–45.0)
TCO2: 30 mmol/L (ref 0–100)
pH, Ven: 7.378 (ref 7.250–7.430)

## 2016-11-15 LAB — COMPREHENSIVE METABOLIC PANEL
ALK PHOS: 136 U/L — AB (ref 38–126)
ALT: 27 U/L (ref 17–63)
AST: 29 U/L (ref 15–41)
Albumin: 3.6 g/dL (ref 3.5–5.0)
Anion gap: 13 (ref 5–15)
BILIRUBIN TOTAL: 1.1 mg/dL (ref 0.3–1.2)
BUN: 25 mg/dL — AB (ref 6–20)
CALCIUM: 7.7 mg/dL — AB (ref 8.9–10.3)
CO2: 31 mmol/L (ref 22–32)
CREATININE: 5.55 mg/dL — AB (ref 0.61–1.24)
Chloride: 96 mmol/L — ABNORMAL LOW (ref 101–111)
GFR calc Af Amer: 12 mL/min — ABNORMAL LOW (ref >60)
GFR, EST NON AFRICAN AMERICAN: 10 mL/min — AB (ref >60)
Glucose, Bld: 96 mg/dL (ref 65–99)
Potassium: 4.8 mmol/L (ref 3.5–5.1)
Sodium: 140 mmol/L (ref 135–145)
TOTAL PROTEIN: 8.7 g/dL — AB (ref 6.5–8.1)

## 2016-11-15 LAB — CBC WITH DIFFERENTIAL/PLATELET
BASOS ABS: 0 10*3/uL (ref 0.0–0.1)
Basophils Relative: 0 %
Eosinophils Absolute: 0.1 10*3/uL (ref 0.0–0.7)
Eosinophils Relative: 1 %
HEMATOCRIT: 44.7 % (ref 39.0–52.0)
HEMOGLOBIN: 14.4 g/dL (ref 13.0–17.0)
LYMPHS PCT: 7 %
Lymphs Abs: 0.5 10*3/uL — ABNORMAL LOW (ref 0.7–4.0)
MCH: 35.1 pg — ABNORMAL HIGH (ref 26.0–34.0)
MCHC: 32.2 g/dL (ref 30.0–36.0)
MCV: 109 fL — AB (ref 78.0–100.0)
MONOS PCT: 10 %
Monocytes Absolute: 0.7 10*3/uL (ref 0.1–1.0)
NEUTROS ABS: 5.8 10*3/uL (ref 1.7–7.7)
NEUTROS PCT: 82 %
Platelets: 99 10*3/uL — ABNORMAL LOW (ref 150–400)
RBC: 4.1 MIL/uL — AB (ref 4.22–5.81)
RDW: 18.7 % — AB (ref 11.5–15.5)
WBC: 7.1 10*3/uL (ref 4.0–10.5)

## 2016-11-15 LAB — BRAIN NATRIURETIC PEPTIDE: B NATRIURETIC PEPTIDE 5: 1968.8 pg/mL — AB (ref 0.0–100.0)

## 2016-11-15 LAB — INFLUENZA PANEL BY PCR (TYPE A & B)
INFLAPCR: POSITIVE — AB
INFLBPCR: NEGATIVE

## 2016-11-15 LAB — I-STAT TROPONIN, ED
Troponin i, poc: 0.18 ng/mL (ref 0.00–0.08)
Troponin i, poc: 0.27 ng/mL (ref 0.00–0.08)

## 2016-11-15 LAB — PROTIME-INR
INR: 2.4
Prothrombin Time: 26.6 seconds — ABNORMAL HIGH (ref 11.4–15.2)

## 2016-11-15 LAB — TROPONIN I: Troponin I: 0.71 ng/mL (ref <0.03)

## 2016-11-15 MED ORDER — DOCUSATE SODIUM 100 MG PO CAPS
400.0000 mg | ORAL_CAPSULE | Freq: Two times a day (BID) | ORAL | Status: DC
  Administered 2016-11-15 – 2016-11-18 (×5): 400 mg via ORAL
  Filled 2016-11-15 (×6): qty 4

## 2016-11-15 MED ORDER — ACETAMINOPHEN 325 MG PO TABS
650.0000 mg | ORAL_TABLET | Freq: Four times a day (QID) | ORAL | Status: DC | PRN
  Administered 2016-11-17 – 2016-11-18 (×2): 650 mg via ORAL
  Filled 2016-11-15 (×4): qty 2

## 2016-11-15 MED ORDER — AMIODARONE HCL 200 MG PO TABS
200.0000 mg | ORAL_TABLET | Freq: Every day | ORAL | Status: DC
  Administered 2016-11-16 – 2016-11-18 (×3): 200 mg via ORAL
  Filled 2016-11-15 (×3): qty 1

## 2016-11-15 MED ORDER — PREDNISONE 20 MG PO TABS
40.0000 mg | ORAL_TABLET | Freq: Every day | ORAL | Status: DC
  Administered 2016-11-16: 40 mg via ORAL
  Filled 2016-11-15: qty 2

## 2016-11-15 MED ORDER — SODIUM CHLORIDE 0.9 % IV BOLUS (SEPSIS)
250.0000 mL | Freq: Once | INTRAVENOUS | Status: AC
  Administered 2016-11-15: 250 mL via INTRAVENOUS

## 2016-11-15 MED ORDER — DOXYCYCLINE HYCLATE 100 MG IV SOLR
100.0000 mg | Freq: Two times a day (BID) | INTRAVENOUS | Status: DC
  Administered 2016-11-15: 100 mg via INTRAVENOUS
  Filled 2016-11-15 (×2): qty 100

## 2016-11-15 MED ORDER — MIDODRINE HCL 5 MG PO TABS
10.0000 mg | ORAL_TABLET | ORAL | Status: DC
  Administered 2016-11-17: 10 mg via ORAL
  Filled 2016-11-15: qty 2

## 2016-11-15 MED ORDER — ACETAMINOPHEN 650 MG RE SUPP: 650.0000 mg | Freq: Four times a day (QID) | RECTAL | Status: DC | PRN

## 2016-11-15 MED ORDER — ATORVASTATIN CALCIUM 20 MG PO TABS
20.0000 mg | ORAL_TABLET | Freq: Every day | ORAL | Status: DC
  Administered 2016-11-16 – 2016-11-17 (×2): 20 mg via ORAL
  Filled 2016-11-15 (×2): qty 1

## 2016-11-15 MED ORDER — CALCIUM ACETATE (PHOS BINDER) 667 MG PO CAPS
4002.0000 mg | ORAL_CAPSULE | Freq: Three times a day (TID) | ORAL | Status: DC
  Administered 2016-11-16 – 2016-11-18 (×8): 4002 mg via ORAL
  Filled 2016-11-15 (×8): qty 6

## 2016-11-15 MED ORDER — WARFARIN - PHARMACIST DOSING INPATIENT
Freq: Every day | Status: DC
  Administered 2016-11-17: 18:00:00

## 2016-11-15 MED ORDER — GABAPENTIN 300 MG PO CAPS
300.0000 mg | ORAL_CAPSULE | Freq: Every day | ORAL | Status: DC
Start: 2016-11-15 — End: 2016-11-18
  Administered 2016-11-15 – 2016-11-17 (×3): 300 mg via ORAL
  Filled 2016-11-15 (×3): qty 1

## 2016-11-15 MED ORDER — FAMOTIDINE 20 MG PO TABS
10.0000 mg | ORAL_TABLET | Freq: Every day | ORAL | Status: DC
Start: 2016-11-16 — End: 2016-11-18
  Administered 2016-11-16 – 2016-11-18 (×3): 10 mg via ORAL
  Filled 2016-11-15 (×3): qty 1

## 2016-11-15 MED ORDER — ONDANSETRON HCL 4 MG/2ML IJ SOLN: 4.0000 mg | Freq: Four times a day (QID) | INTRAMUSCULAR | Status: DC | PRN

## 2016-11-15 MED ORDER — HYDROCODONE-ACETAMINOPHEN 5-325 MG PO TABS: 1.0000 | ORAL_TABLET | ORAL | Status: DC | PRN

## 2016-11-15 MED ORDER — SODIUM POLYSTYRENE SULFONATE 15 GM/60ML PO SUSP
15.0000 g | ORAL | Status: DC
  Administered 2016-11-16: 15 g via ORAL
  Filled 2016-11-15 (×2): qty 60

## 2016-11-15 MED ORDER — SODIUM CHLORIDE 0.9% FLUSH
3.0000 mL | Freq: Two times a day (BID) | INTRAVENOUS | Status: DC
  Administered 2016-11-15 – 2016-11-18 (×4): 3 mL via INTRAVENOUS

## 2016-11-15 MED ORDER — SODIUM CHLORIDE 0.9% FLUSH
3.0000 mL | Freq: Two times a day (BID) | INTRAVENOUS | Status: DC
  Administered 2016-11-16 – 2016-11-18 (×3): 3 mL via INTRAVENOUS

## 2016-11-15 MED ORDER — SODIUM CHLORIDE 0.9 % IV SOLN: 250.0000 mL | INTRAVENOUS | Status: DC | PRN

## 2016-11-15 MED ORDER — IPRATROPIUM-ALBUTEROL 0.5-2.5 (3) MG/3ML IN SOLN
3.0000 mL | Freq: Once | RESPIRATORY_TRACT | Status: AC
  Administered 2016-11-15: 3 mL via RESPIRATORY_TRACT
  Filled 2016-11-15: qty 3

## 2016-11-15 MED ORDER — ONDANSETRON HCL 4 MG PO TABS: 4.0000 mg | ORAL_TABLET | Freq: Four times a day (QID) | ORAL | Status: DC | PRN

## 2016-11-15 MED ORDER — SODIUM CHLORIDE 0.9% FLUSH: 3.0000 mL | INTRAVENOUS | Status: DC | PRN

## 2016-11-15 MED ORDER — LEVALBUTEROL HCL 1.25 MG/0.5ML IN NEBU
1.2500 mg | INHALATION_SOLUTION | RESPIRATORY_TRACT | Status: DC | PRN
  Administered 2016-11-16: 1.25 mg via RESPIRATORY_TRACT
  Filled 2016-11-15: qty 0.5

## 2016-11-15 MED ORDER — CITALOPRAM HYDROBROMIDE 20 MG PO TABS
20.0000 mg | ORAL_TABLET | Freq: Every day | ORAL | Status: DC
  Administered 2016-11-15 – 2016-11-17 (×3): 20 mg via ORAL
  Filled 2016-11-15 (×3): qty 1

## 2016-11-15 MED ORDER — GUAIFENESIN ER 600 MG PO TB12
600.0000 mg | ORAL_TABLET | Freq: Two times a day (BID) | ORAL | Status: DC
  Administered 2016-11-15 – 2016-11-18 (×6): 600 mg via ORAL
  Filled 2016-11-15 (×6): qty 1

## 2016-11-15 MED ORDER — CLOPIDOGREL BISULFATE 75 MG PO TABS
75.0000 mg | ORAL_TABLET | Freq: Every day | ORAL | Status: DC
  Administered 2016-11-16 – 2016-11-18 (×3): 75 mg via ORAL
  Filled 2016-11-15 (×3): qty 1

## 2016-11-15 MED ORDER — IPRATROPIUM-ALBUTEROL 0.5-2.5 (3) MG/3ML IN SOLN
3.0000 mL | Freq: Four times a day (QID) | RESPIRATORY_TRACT | Status: DC
  Administered 2016-11-16 – 2016-11-18 (×7): 3 mL via RESPIRATORY_TRACT
  Filled 2016-11-15 (×8): qty 3

## 2016-11-15 MED ORDER — WARFARIN SODIUM 7.5 MG PO TABS
3.7500 mg | ORAL_TABLET | Freq: Every day | ORAL | Status: DC
  Administered 2016-11-16 – 2016-11-17 (×2): 3.75 mg via ORAL
  Filled 2016-11-15 (×4): qty 0.5

## 2016-11-15 NOTE — H&P (Signed)
[name] NGE:[id number] DOB: 1956-06-28 DOA: 11/15/2016     PCP: Teressa Lower MD  [facility]: Cardiology, [facility] Patient coming from:   home Lives   With family   Chief Complaint: Redness of breath and episode of hypotension while in dialysis  HPI: [name] is a 61 y.o. male with medical history significant of CAD, bioprosthetic aortic valve replacement paroxysmal atrial fibrillation on Coumadin, end-stage renal disease on hemodialysis M,W and F. OSA, SVT, chronic respiratory failure on home oxygen, COPD  Presented with worsening shortness of breath while in dialysis today patient have had episode of hypotension and his dialysis in the dialysis had to be cut short.   Ever since March 8th when he had a TEE done patient have had some cough is been having wheezing and progressive shortness of breath try to use home inhalers but did not seem to help he is on home oxygen wife had increased from 2 L to 3. wife states he's been more lethargic than usual. He does endorse cold like symptoms cough has been productive of clear sputum is been going on for almost a week. Patient denies any fevers or chills no chest pain associated with this no abdominal pain no nausea vomiting no diarrhea. Reports last night got severely weak trouble walking. Feels better now but not back to baseline.  She states dyspnea improved since arrival to ER better with nebulizer treatments but not back to baseline Regarding pertinent Chronic problems: reguarding history of coronary artery disease followed by cardiology s/p CABG in Aug 2014 with Y SVG to LAD &D2; NSTEMI 01/2015 [facility] s/p DES to LCx and BMS to SVG-LAD, NSTEMI 05/2015 non-flow limiting FFR of Cx, low risk nuc 3/17, recent anterior STEMI 02/2016 s/p PTCA/DES to SVG-LAD extending in native vessel. 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 History of OSA but does not tolerate BiPAP  History of A. fib flutter initially on amiodarone underwent [place] V in November 2017 went into normal sinus rhythm and discharged on amiodarone. In January patient had recurrence of A. fib with RVR with prolonged QTc and his amiodarone was decreased to 200 twice a day February patient's was in a flutter undergone TEE March 8 showing no thrombus   IN ER:  Temp (24hrs), Avg:98.7 F (37.1 C), Min:98.7 F (37.1 C), Max:98.7 F (37.1 C)     RR 18 93% Hr 71 BP 97/74 ABG 7.378/47.8 Troponin 0.18 WBC 7.1 hemoglobin 14.4  pLT 99 Na 140 K 4.8 Cr 5.55 BNP 1968 which is down from November Chest x-ray showing cardiomegaly with mild pulmonary vascular congestion Following Medications were ordered in ER: Medications  ipratropium-albuterol (DUONEB) 0.5-2.5 (3) MG/3ML nebulizer solution 3 mL (3 mLs Nebulization Given 11/15/16 1609)     Hospitalist was called for admission for Acute on chronic Respiratory failure with hypoxia likely combination of  COPD exacerbation and CHF  Review of Systems:    Pertinent positives include: shortness of breath at rest.dyspnea on exertion, productive cough, wheezing.  Constitutional:  No weight loss, night sweats, Fevers, chills, fatigue, weight loss HEENT:  No headaches, Difficulty swallowing,Tooth/dental problems,Sore throat,  No sneezing, itching, ear ache, nasal congestion, post nasal drip,  Cardio-vascular:  No chest pain, Orthopnea, PND, anasarca, dizziness, palpitations.no Bilateral lower extremity swelling  GI:  No heartburn, indigestion, abdominal pain, nausea, vomiting, diarrhea, change in bowel habits, loss of appetite, melena, blood in stool, hematemesis  Resp:     No excess mucus, no  No non-productive cough, No coughing up of blood.No change in color of mucus.No  Skin:  no rash or lesions. No jaundice GU:  no dysuria, change in color of urine, no urgency or frequency. No straining to urinate.    No flank pain.  Musculoskeletal:  No joint pain or no joint swelling. No decreased range of motion. No back pain.  Psych:  No change in mood or affect. No depression or anxiety. No memory loss.  Neuro: no localizing neurological complaints, no tingling, no weakness, no double vision, no gait abnormality, no slurred speech, no confusion  As per HPI otherwise 10 point review of systems negative.   Past Medical History: Past Medical History:  Diagnosis Date  . Acute blood loss anemia    a. 02/2016 due to groin hematoma. Rec 3 U PRBC.  Marland Kitchen Anemia   . Aortic heart valve prolapse 04/2013   a. s/p bioprosthetic AVR at time of CABG.  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 Bifascicular block   . CAD (coronary artery disease) 04/2013   a. 04/2013: s/p CABG x 2 (Y SVG -LAD & D2). b. 01/2015: NSTEMI s/p DES to LCx, BMS to SVG-LAD. c. NSTEMI 05/2015 during AF/AFL - non-flow limiting FFR. d. Low risk nuc 3/17. e. STEMI 6/17 after coming off Plavix, s/p DES to SVG-LAD into native vessel.  . Carotid artery disease ([place])    a. 1-39% stenosis bilaterally in 06/2015.   Marland Kitchen Chronic respiratory failure ([place])   . 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 b. He had a renal Tx from 2008 to 2011, but subsequent rejection - Gets HD in [place], [place] on MWF schedule.    . Hematoma    a. Large right groin hematoma after cath 02/2016 with associated ABL anemia.  . Hypertension   . Multiple sclerosis ([place])   . Nocturnal hypoxemia   . On home oxygen therapy    pt states he has not been wearing it  . PAF (paroxysmal atrial fibrillation) ([place])    a. h/o, placed on amiodarone 07/2016 due to recurrence.  . Paroxysmal atrial flutter ([place])    a. During 05/2015 admission - SVT, atrial flutter, and PAF.  Marland Kitchen Peripheral vascular disease ([place])    Cath in 01/2015 required 45 cm [place]  . Renal transplant failure and rejection   . S/P CABG x 2 04/2013   s/p CABG  x 2 (Y SVG -LAD & D2);   Marland Kitchen Sleep apnea    a. intolerant of bipap.  Marland Kitchen SVT (supraventricular tachycardia) ([place])   . Valvular heart disease    a. 2D Echo 03/25/16: mild LVH, EF 50-55%, grade 2 Dd, AVR present with mild AI, mod MR, severely dilated LA, mildly dilated RV, mod RAE, PASP 72.   Past Surgical History:  Procedure Laterality Date  . AV FISTULA PLACEMENT    . CARDIAC CATHETERIZATION N/A 05/26/2015   Procedure: Left Heart Cath and Coronary Angiography;  Surgeon: Leonie Man, MD;  Location: [facility] CV LAB;  Service: Cardiovascular;  Laterality: N/A;  . CARDIAC CATHETERIZATION N/A 05/26/2015   Procedure: Intravascular Pressure Wire/FFR Study;  Surgeon: Leonie Man, MD;  Location: [facility];  Service: Cardiovascular;  Laterality: N/A;  . CARDIAC CATHETERIZATION N/A 02/15/2016   Procedure: Left Heart Cath and Coronary Angiography;  Surgeon: Wellington Hampshire, MD;  Location: [facility]  CV LAB;  Service: Cardiovascular;  Laterality: N/A;  . CARDIOVERSION N/A 07/12/2016   Procedure: CARDIOVERSION;  Surgeon: Minus Breeding, MD;  Location: [facility] ENDOSCOPY;  Service: Cardiovascular;  Laterality: N/A;  . CARDIOVERSION N/A 11/11/2016   Procedure: CARDIOVERSION;  Surgeon: Fay Records, MD;  Location: [facility];  Service: Cardiovascular;  Laterality: N/A;  . CORONARY ARTERY BYPASS GRAFT    . excised squamous cells at rectum  2006  . flash     flash pulmonary edema  . HERNIA REPAIR  07/2011  . KIDNEY TRANSPLANT  08/2007  . LEFT HEART CATHETERIZATION WITH CORONARY ANGIOGRAM N/A 01/09/2013   Procedure: LEFT HEART CATHETERIZATION WITH CORONARY ANGIOGRAM;  Surgeon: Burnell Blanks, MD;  Location: [facility] CATH LAB;  Service: Cardiovascular;  Laterality: N/A;  . TEE WITHOUT CARDIOVERSION N/A 11/11/2016   Procedure: TRANSESOPHAGEAL ECHOCARDIOGRAM (TEE);  Surgeon: Fay Records, MD;  Location: [facility] ENDOSCOPY;  Service: Cardiovascular;  Laterality: N/A;     Social History:  Ambulatory   independently      reports that he quit smoking about 18 years ago. His smoking use included Cigarettes. He has a 40.00 pack-year smoking history. He has never used smokeless tobacco. He reports that he does not drink alcohol or use drugs.  Allergies:   Allergies  Allergen Reactions  . Diphenhydramine Itching    Only with IV doses. Tolerates oral.  . Penicillins Other (See Comments)    migraine  . Adhesive [Tape] Rash    Please use paper tape  . Amoxicillin Rash    migraine  . Ativan [Lorazepam] Anxiety  . Other Rash    Please use paper tape       Family History:   Family History  Problem Relation Age of Onset  . Heart failure Mother     started in 4s  . Emphysema Mother     smoked  . Diabetes Father   . Lupus Sister   . Heart attack Maternal Grandfather   . Heart attack Maternal Uncle   . Heart attack Maternal Aunt   . Hypertension Maternal Aunt   . Stroke Neg Hx     Medications: Prior to Admission medications   Medication Sig Start Date End Date Taking? Authorizing Provider  acetaminophen (TYLENOL) 500 MG tablet Take 1,000 mg by mouth every 6 (six) hours as needed for pain or fever.   Yes Historical Provider, MD  albuterol (PROAIR HFA) 108 (90 BASE) MCG/ACT inhaler Inhale 1-2 puffs into the lungs every 6 (six) hours as needed for shortness of breath.  07/19/11  Yes Historical Provider, MD  amiodarone (PACERONE) 200 MG tablet Take 1 tablet (200 mg total) by mouth daily. 09/13/16  Yes Burnell Blanks, MD  atorvastatin (LIPITOR) 40 MG tablet Take 20 mg by mouth daily at 6 PM.    Yes Historical Provider, MD  calcium acetate (PHOSLO) 667 MG capsule Take 4,002 mg by mouth 3 (three) times daily with meals.    Yes Historical Provider, MD  citalopram (CELEXA) 20 MG tablet Take 20 mg by mouth at bedtime.    Yes Historical Provider, MD  clopidogrel (PLAVIX) 75 MG tablet Take 1 tablet (75 mg total) by mouth daily. 02/19/16  Yes Dayna N Dunn, PA-C  docusate sodium  (COLACE) 100 MG capsule Take 400 mg by mouth 2 (two) times daily.    Yes Historical Provider, MD  folic acid (FOLVITE) 1 MG tablet Take 1 mg by mouth daily.    Yes Historical Provider, MD  gabapentin (NEURONTIN)  300 MG capsule Take 300 mg by mouth at bedtime.    Yes Historical Provider, MD  midodrine (PROAMATINE) 10 MG tablet Take 1 tablet (10 mg total) by mouth every Monday, Wednesday, and Friday with hemodialysis. 07/14/16  Yes [name], MD  multivitamin (RENA-VIT) TABS tablet Take 1 tablet by mouth daily. 01/11/13  Yes Samella Parr, NP  OXYGEN Inhale 2 L into the lungs at bedtime. 2lpm as needed    Yes Historical Provider, MD  ranitidine (ZANTAC) 150 MG tablet Take 150 mg by mouth every morning.    Yes Historical Provider, MD  sodium polystyrene (KAYEXALATE) 15 GM/60ML suspension Take 15 g by mouth See admin instructions. Only take 15 g on Sun / Tues / Thurs / Sat (non dialysis days)   Yes Historical Provider, MD  UNABLE TO FIND Take 1 application by mouth at bedtime. Med Name: BIPAP    Yes Historical Provider, MD  warfarin (COUMADIN) 7.5 MG tablet Take 1/2-1 tablet by mouth daily as directed by coumadin clinic Patient taking differently: Take 1/2 tab by mouth daily in evenings 10/13/16  Yes Sueanne Margarita, MD  metoprolol tartrate (LOPRESSOR) 25 MG tablet Take 0.5 tablets (12.5 mg total) by mouth 2 (two) times daily. 07/13/16   [name], MD  nitroGLYCERIN (NITROSTAT) 0.4 MG SL tablet Place 0.4 mg under the tongue every 5 (five) minutes as needed for chest pain (Take one tabe every 5-10 minutes for chest pain. Once you have taken three tabs you need to notify your MD or go to ER if chest pain not gone.).    Historical Provider, MD    Physical Exam: Patient Vitals for the past 24 hrs:  BP Temp Temp src Pulse Resp SpO2  11/15/16 1745 97/74 - - 71 18 93 %  11/15/16 1730 100/73 - - 72 24 91 %  11/15/16 1715 97/77 - - 72 21 94 %  11/15/16 1700 104/75 - - 72 17 92 %  11/15/16 1615 104/72 - -  70 16 100 %  11/15/16 1600 102/77 - - 74 17 95 %  11/15/16 1551 - - - 72 22 96 %  11/15/16 1550 - - - 72 15 98 %  11/15/16 1546 - - - 71 13 94 %  11/15/16 1543 - - - (!) 58 - (!) 83 %  11/15/16 1542 - - - 75 - (!) 86 %  11/15/16 1541 107/73 - - - - -  11/15/16 1534 92/75 98.7 F (37.1 C) Oral 76 19 (!) 86 %    1. General:  in No Acute distress 2. Psychological: Alert and   Oriented 3. Head/ENT:   Moist   Mucous Membranes                          Head Non traumatic, neck supple                         Poor Dentition 4. SKIN: normal  Skin turgor,  Skin clean Dry and intact no rash 5. Heart: Regular rate and rhythm no  Murmur, Rub or gallop 6. Lungs:  no wheezes some crackles distant breath sounds  7. Abdomen: Soft,  non-tender, Non distended 8. Lower extremities: no clubbing, cyanosis, or edema 9. Neurologically Grossly intact, moving all 4 extremities equally   10. MSK: Normal range of motion   body mass index is unknown because there is no height or weight on  file.  Labs on Admission:   Labs on Admission: I have personally reviewed following labs and imaging studies  CBC:  Recent Labs Lab 11/11/16 0929 11/15/16 1550  WBC 6.2 7.1  NEUTROABS  --  5.8  HGB 13.8 14.4  HCT 42.9 44.7  MCV 110.9* 109.0*  PLT 126* 99*   Basic Metabolic Panel:  Recent Labs Lab 11/11/16 0929 11/15/16 1550  NA 140 140  K 4.6 4.8  CL 98* 96*  CO2 29 31  GLUCOSE 84 96  BUN 31* 25*  CREATININE 6.28* 5.55*  CALCIUM 7.5* 7.7*   GFR: Estimated Creatinine Clearance: 14 mL/min (by C-G formula based on SCr of 5.55 mg/dL (H)). Liver Function Tests:  Recent Labs Lab 11/15/16 1550  AST 29  ALT 27  ALKPHOS 136*  BILITOT 1.1  PROT 8.7*  ALBUMIN 3.6   No results for input(s): LIPASE, AMYLASE in the last 168 hours. No results for input(s): AMMONIA in the last 168 hours. Coagulation Profile:  Recent Labs Lab 11/11/16 0900  INR 3.5   Cardiac Enzymes: No results for input(s):  CKTOTAL, CKMB, CKMBINDEX, TROPONINI in the last 168 hours. BNP (last 3 results) No results for input(s): PROBNP in the last 8760 hours. HbA1C: No results for input(s): HGBA1C in the last 72 hours. CBG: No results for input(s): GLUCAP in the last 168 hours. Lipid Profile: No results for input(s): CHOL, HDL, LDLCALC, TRIG, CHOLHDL, LDLDIRECT in the last 72 hours. Thyroid Function Tests: No results for input(s): TSH, T4TOTAL, FREET4, T3FREE, THYROIDAB in the last 72 hours. Anemia Panel: No results for input(s): VITAMINB12, FOLATE, FERRITIN, TIBC, IRON, RETICCTPCT in the last 72 hours. Urine analysis: No results found for: COLORURINE, APPEARANCEUR, LABSPEC, PHURINE, GLUCOSEU, HGBUR, BILIRUBINUR, KETONESUR, PROTEINUR, UROBILINOGEN, NITRITE, LEUKOCYTESUR Sepsis Labs: '@LABRCNTIP'$ (procalcitonin:4,lacticidven:4) )No results found for this or any previous visit (from the past 240 hour(s)).     UA not ordered  No results found for: HGBA1C  Estimated Creatinine Clearance: 14 mL/min (by C-G formula based on SCr of 5.55 mg/dL (H)).  BNP (last 3 results) No results for input(s): PROBNP in the last 8760 hours.   ECG REPORT  Independently reviewed Rate:67  Rhythm: SR w RBBB ST&T Change: No acute ischemic changes  QTC 467  There were no vitals filed for this visit.   Cultures:    Component Value Date/Time   SDES BLOOD RIGHT WRIST 07/09/2016 1800   SPECREQUEST BOTTLES DRAWN AEROBIC AND ANAEROBIC 5CC 07/09/2016 1800   CULT NO GROWTH 5 DAYS 07/09/2016 1800   REPTSTATUS 07/14/2016 FINAL 07/09/2016 1800     Radiological Exams on Admission: Dg Chest 2 View  Result Date: 11/15/2016 CLINICAL DATA:  Productive cough, shortness of breath for 3 days EXAM: CHEST  2 VIEW COMPARISON:  08/04/2016 FINDINGS: Bilateral diffuse interstitial thickening. Trace right pleural effusion. No pneumothorax. Stable cardiomegaly. Prior CABG. Prior aortic bowel replacement. No acute osseous abnormality. IMPRESSION:  Cardiomegaly with mild pulmonary vascular congestion. Electronically Signed   By: Kathreen Devoid   On: 11/15/2016 16:50    Chart has been reviewed    Assessment/Plan   61 y.o. male with medical history significant of CAD, bioprosthetic aortic valve replacement paroxysmal atrial fibrillation on Coumadin, end-stage renal disease on hemodialysis M,W and F. OSA, SVT, chronic respiratory failure on home oxygen, COPD admitted for  Acute on chronic Respiratory failure with hypoxia likely combination of  COPD exacerbation and CHF Elevated troponin and intermittent episodes of hypotension  Present on Admission: . COPD exacerbation ([place]) -  -  -  Will initiate: Steroid taper  -  Antibiotics   Doxycycline, -   XopenexPRN, - scheduled duoneb,  -  Breo or Dulera at discharge   -  Mucinex.  Titrate O2 to saturation >90%. Follow patients respiratory status.  Currently mentating well no evidence of symptomatic hypercarbia COPD Gold Protocol initiated . ESRD on hemodialysis - nephrology notified and will see in the morning . Essential (primary) hypertension patient has been hypotensive his metoprolol has been on hold continue to monitor . Hypotension and recurrent event associated with hemodialysis will continue Midodrin  For significant hypotension can support with fluid bolus 250 MLS . OSA -not compliant with C-pap patient not interested in BiPAP . Elevated troponin continue to cycle recently have had TEE. Appreciate cardiology consult . Paroxysmal atrial flutter ([facility])          - CHA2DS2 vas score 4: continue current anticoagulation with Coumadin per pharmacy,             -  Rate control:  Currently controlled holding metoprolol given hypotension         - Rhythm control: Continue amiodarone  . Acute respiratory failure with hypoxia ([facility]) most likely secondary to COPD exacerbation  . CAD (coronary artery disease) appreciate cardiology consult chest pain free troponin leak likely secondary to demand  ischemia  Other plan as per orders.  DVT prophylaxis:  coumadin  Code Status:  FULL CODE  as per patient    Family Communication:   Family   at  Bedside  plan of care was discussed with  Wife,   Disposition Plan:    To home once workup is complete and patient is stable                      Would benefit from PT/OT eval prior to DC  ordered                                               Consults called: nephrology , Email Cardiology   Admission status:  inpatient      Level of care    tele       I have spent a total of 56 min on this admission  Mako Pelfrey 11/15/2016, 9:45 PM    [facility]  Pager [phone]   after 2 AM please page floor coverage PA If 7AM-7PM, please contact the day team taking care of the patient  [email or web address]  Password [email or web address]

## 2016-11-15 NOTE — ED Notes (Signed)
Pt back from x-ray.

## 2016-11-15 NOTE — ED Notes (Signed)
1 set of blood cultures drawn with IV start and placed at bedside in case needed.

## 2016-11-15 NOTE — ED Notes (Signed)
Admitting Provider at bedside. 

## 2016-11-15 NOTE — Telephone Encounter (Signed)
I reviewed with Fuller Canada, PharmD and if cough is loose and productive pt can take Guaifenesin.  If dry can take dextromethorphan.  I spoke with pt's wife and gave her this information.  She reports she is concerned because pt is short of breath and wheezing. On home oxygen and wife is asking about adjusting this and using breathing treatments.  I asked her to contact pulmonary for these questions.  I told her if pt is very short of breath he should go to ED for evaluation.

## 2016-11-15 NOTE — ED Triage Notes (Signed)
Pt is a dialysis pt and had dialysis this am but started having productive cough yesterday with congestion.  Wife st's pt's 02 sats have been low and pt has been lethargic.  Pt is on 02 at night only

## 2016-11-15 NOTE — Progress Notes (Addendum)
ANTICOAGULATION CONSULT NOTE - Initial Consult  Pharmacy Consult for Coumadin Indication: h/o PAF   Allergies  Allergen Reactions  . Diphenhydramine Itching    Only with IV doses. Tolerates oral.  . Penicillins Other (See Comments)    migraine  . Adhesive [Tape] Rash    Please use paper tape  . Amoxicillin Rash    migraine  . Ativan [Lorazepam] Anxiety  . Other Rash    Please use paper tape    Patient Measurements: Height: '5\' 10"'$  (177.8 cm) Weight: 179 lb 3.7 oz (81.3 kg) IBW/kg (Calculated) : 73 Heparin Dosing Weight: 81.3 kg  Vital Signs: Temp: 98.8 F (37.1 C) (03/12 2109) Temp Source: Oral (03/12 2109) BP: 88/58 (03/12 2109) Pulse Rate: 73 (03/12 2109)  Labs:  Recent Labs  11/15/16 1550  HGB 14.4  HCT 44.7  PLT 99*  CREATININE 5.55*    Estimated Creatinine Clearance: 14.4 mL/min (by C-G formula based on SCr of 5.55 mg/dL (H)).   Medical History: Past Medical History:  Diagnosis Date  . Acute blood loss anemia    a. 02/2016 due to groin hematoma. Rec 3 U PRBC.  Marland Kitchen Anemia   . Aortic heart valve prolapse 04/2013   a. s/p bioprosthetic AVR at time of CABG.  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 Bifascicular block   . CAD (coronary artery disease) 04/2013   a. 04/2013: s/p CABG x 2 (Y SVG -LAD & D2). b. 01/2015: NSTEMI s/p DES to LCx, BMS to SVG-LAD. c. NSTEMI 05/2015 during AF/AFL - non-flow limiting FFR. d. Low risk nuc 3/17. e. STEMI 6/17 after coming off Plavix, s/p DES to SVG-LAD into native vessel.  . Carotid artery disease ([place])    a. 1-39% stenosis bilaterally in 06/2015.   Marland Kitchen Chronic respiratory failure ([place])   . 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 b. He had a renal Tx from 2008 to 2011, but subsequent rejection - Gets HD in [place], [place] on MWF schedule.    . Hematoma    a. Large right groin hematoma after cath 02/2016 with associated ABL anemia.  . Hypertension   . Multiple sclerosis  ([place])   . Nocturnal hypoxemia   . On home oxygen therapy    pt states he has not been wearing it  . PAF (paroxysmal atrial fibrillation) ([place])    a. h/o, placed on amiodarone 07/2016 due to recurrence.  . Paroxysmal atrial flutter ([place])    a. During 05/2015 admission - SVT, atrial flutter, and PAF.  Marland Kitchen Peripheral vascular disease ([place])    Cath in 01/2015 required 45 cm Destination [place]  . Renal transplant failure and rejection   . 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 intolerant of bipap.  Marland Kitchen SVT (supraventricular tachycardia) ([place])   . Valvular heart disease    a. 2D Echo 03/25/16: mild LVH, EF 50-55%, grade 2 Dd, AVR present with mild AI, mod MR, severely dilated LA, mildly dilated RV, mod RAE, PASP 72.    Medications:  Prescriptions Prior to Admission  Medication Sig Dispense Refill Last Dose  . acetaminophen (TYLENOL) 500 MG tablet Take 1,000 mg by mouth every 6 (six) hours as needed for pain or fever.   Past Week at Unknown time  . albuterol (PROAIR HFA) 108 (90 BASE) MCG/ACT inhaler Inhale 1-2 puffs into the  lungs every 6 (six) hours as needed for shortness of breath.    Past Month at Unknown time  . amiodarone (PACERONE) 200 MG tablet Take 1 tablet (200 mg total) by mouth daily. 90 tablet 3 11/15/2016 at Unknown time  . atorvastatin (LIPITOR) 40 MG tablet Take 20 mg by mouth daily at 6 PM.    11/14/2016 at Unknown time  . calcium acetate (PHOSLO) 667 MG capsule Take 4,002 mg by mouth 3 (three) times daily with meals.    11/15/2016 at Unknown time  . citalopram (CELEXA) 20 MG tablet Take 20 mg by mouth at bedtime.    11/14/2016 at Unknown time  . clopidogrel (PLAVIX) 75 MG tablet Take 1 tablet (75 mg total) by mouth daily. 90 tablet 3 11/15/2016 at Unknown time  . docusate sodium (COLACE) 100 MG capsule Take 400 mg by mouth 2 (two) times daily.    11/15/2016 at Unknown time  . folic acid (FOLVITE) 1 MG tablet Take 1 mg by mouth daily.    11/15/2016 at  Unknown time  . gabapentin (NEURONTIN) 300 MG capsule Take 300 mg by mouth at bedtime.    11/14/2016 at Unknown time  . midodrine (PROAMATINE) 10 MG tablet Take 1 tablet (10 mg total) by mouth every Monday, Wednesday, and Friday with hemodialysis.   11/15/2016 at Unknown time  . multivitamin (RENA-VIT) TABS tablet Take 1 tablet by mouth daily. 30 tablet 1 11/15/2016 at Unknown time  . OXYGEN Inhale 2 L into the lungs at bedtime. 2lpm as needed    11/14/2016 at Unknown time  . ranitidine (ZANTAC) 150 MG tablet Take 150 mg by mouth every morning.    11/15/2016 at Unknown time  . sodium polystyrene (KAYEXALATE) 15 GM/60ML suspension Take 15 g by mouth See admin instructions. Only take 15 g on Sun / Tues / Thurs / Sat (non dialysis days)   11/14/2016 at Unknown time  . UNABLE TO FIND Take 1 application by mouth at bedtime. Med Name: BIPAP    11/14/2016 at Unknown time  . warfarin (COUMADIN) 7.5 MG tablet Take 1/2-1 tablet by mouth daily as directed by coumadin clinic (Patient taking differently: Take 1/2 tab by mouth daily in evenings) 30 tablet 1 11/14/2016 at Unknown time  . nitroGLYCERIN (NITROSTAT) 0.4 MG SL tablet Place 0.4 mg under the tongue every 5 (five) minutes as needed for chest pain (Take one tabe every 5-10 minutes for chest pain. Once you have taken three tabs you need to notify your MD or go to ER if chest pain not gone.).   prn   Scheduled:  . doxycycline (VIBRAMYCIN) IV  100 mg Intravenous Q12H    Assessment: 61 y.o  presented to [facility] ED on 11/15/16 with worsening shortness of breath while in dialysis today patient have had episode of hypotension and his dialysis in the dialysis had to be cut short.  Also h/o bioprosthetic aoric valve replacement/CABG (04/2013).  He takes coumadin PTA for h/o PAF, last taken pta on 11/14/16.  No admit INR done in ED.  Pltc low = 99k, Hgb 14.4 No bleeding noted.   PTA Coumadin dose: 3.75 mg po daily, LD pta on 3/11  Anticoag visit note on 11/11/16: INR 3.5: dose  held 3/8 then decr'd to 1/2 tablet (3.75 mg) daily (from prev dose of 3.'75mg'$  daily except 7.'5mg'$  qWed)    Goal of Therapy:  INR 2-3 (confirmed with outpt anticoag office notes) Monitor platelets by anticoagulation protocol: Yes   Plan:  Check  INR STAT then order coumadin dose after evaluating the INR result.  Daily INR.  Thank you for allowing pharmacy to be part of this patients care team. Nicole Cella, RPh Clinical Pharmacist Pager: [phone],  [place] [phone] 11/15/2016,9:46 PM   ADDENDUM: INR at goal, 2.4.  Will continue home Coumadin dose. Wynona Neat, PharmD, BCPS  11/15/2016 11:07 PM

## 2016-11-15 NOTE — ED Notes (Signed)
Pt able to ambulate in room independently.

## 2016-11-15 NOTE — Telephone Encounter (Signed)
New message      Pt had a cardioversion last thurs.  Pt is coughing a lot.  Wife want to know what she can give him

## 2016-11-15 NOTE — ED Notes (Signed)
Abnormal VBG and I-stat trop reported to Dr. Adela Glimpse and Dr. Stark Jock

## 2016-11-15 NOTE — ED Provider Notes (Signed)
[facility] DEPT Provider Note   CSN: [id number] Arrival date & time: 11/15/16  1521     History   Chief Complaint Chief Complaint  Patient presents with  . Cough    HPI [name] is a 61 y.o. male.  HPI  61 year old male with history of CAD status post CABG and stenting, ESRD status post failed renal transplant on dialysis Monday Wednesday Friday, MS no longer on steroids or medications, paroxysmal A. 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 Patient states that he's had some cold-like symptoms with cough productive of clear sputum and rhinorrhea for about the last week. Today pt developed increased work of breathing, worse with exertion, requiring an increase in his home oxygen from 2 L to 3 L. Also reports increased wheezing. States that he used his home inhalers without improvement. Denies fevers, chest pain, abdominal pain, nausea, vomiting, diaphoresis, or diarrhea. He went to dialysis today, but they were unable to complete the treatment secondary to shortness of breath and low blood pressure.  Past Medical History:  Diagnosis Date  . Acute blood loss anemia    a. 02/2016 due to groin hematoma. Rec 3 U PRBC.  Marland Kitchen Anemia   . Aortic heart valve prolapse 04/2013   a. s/p bioprosthetic AVR at time of CABG.  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 Bifascicular block   . CAD (coronary artery disease) 04/2013   a. 04/2013: s/p CABG x 2 (Y SVG -LAD & D2). b. 01/2015: NSTEMI s/p DES to LCx, BMS to SVG-LAD. c. NSTEMI 05/2015 during AF/AFL - non-flow limiting FFR. d. Low risk nuc 3/17. e. STEMI 6/17 after coming off Plavix, s/p DES to SVG-LAD into native vessel.  . Carotid artery disease ([place])    a. 1-39% stenosis bilaterally in 06/2015.   Marland Kitchen Chronic respiratory failure ([place])   . 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 b. He had a renal  Tx from 2008 to 2011, but subsequent rejection - Gets HD in [place], [place] on MWF schedule.    . Hematoma    a. Large right groin hematoma after cath 02/2016 with associated ABL anemia.  . Hypertension   . Multiple sclerosis ([place])   . Nocturnal hypoxemia   . On home oxygen therapy    pt states he has not been wearing it  . PAF (paroxysmal atrial fibrillation) ([place])    a. h/o, placed on amiodarone 07/2016 due to recurrence.  . Paroxysmal atrial flutter ([place])    a. During 05/2015 admission - SVT, atrial flutter, and PAF.  Marland Kitchen Peripheral vascular disease ([place])    Cath in 01/2015 required 45 cm Destination [place]  . Renal transplant failure and rejection   . 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 intolerant of bipap.  Marland Kitchen SVT (supraventricular tachycardia) ([place])   . Valvular heart disease    a. 2D Echo 03/25/16: mild LVH, EF 50-55%, grade 2 Dd, AVR present with mild AI, mod MR, severely dilated LA, mildly dilated RV, mod RAE, PASP 72.    Patient Active Problem List   Diagnosis Date Noted  . CAP (community acquired pneumonia) 11/15/2016  . Acute respiratory failure with hypoxia ([place]) 11/15/2016  . COPD exacerbation ([place]) 11/15/2016  . Long term current use of amiodarone  09/28/2016  . CHF (congestive heart failure) ([place]) 07/09/2016  . CAD (coronary artery disease) 07/09/2016  . Acute CHF ([place]) 07/09/2016  . COPD GOLD III  03/24/2016  . Elevated troponin 03/24/2016  . Chronic diastolic congestive heart failure ([place]) 03/24/2016  . SOB (shortness of breath) 03/24/2016  . Left leg swelling   . Macrocytic anemia   . Acute blood loss anemia 02/19/2016  . Groin hematoma 02/19/2016  . NSVT (nonsustained ventricular tachycardia) ([place]) 02/19/2016  . Bifascicular block   . Nocturnal hypoxemia   . Chronic anticoagulation-Coumadin 02/15/2016  . History of tissue AVR 2014 02/15/2016  . Hx of CABG x 2 2014 02/15/2016  . Acute ST elevation myocardial infarction  (STEMI) ([place]) 02/15/2016  . Encounter for therapeutic drug monitoring 06/04/2015  . Paroxysmal atrial flutter ([place]) 06/04/2015  . SVT (supraventricular tachycardia) ([place]) 06/04/2015  . Anemia   . CAD- s/p CABG in Aug 2014 with Y SVG to LAD & D2; NSTEMI 01/2015 [facility] s/p DES to LCx and BMS to SVG-LAD, NSTEMI 05/2015 non-flow limiting FFR of Cx, STEMI 02/2016 s/p angioplasty/DES to SVG-LAD   . Typical atrial flutter ([place]) 05/21/2015  . PAF (paroxysmal atrial fibrillation) ([place]) 02/21/2015  . CAD of artery bypass graft-occluded SVG-LAD 02/15/16   . Essential (primary) hypertension   . Non-ST elevation MI (NSTEMI) ([place]) 01/09/2013  . OSA -not compliant with C-pap 01/09/2013  . GPC Bacteremia 11/14/2012  . Renal transplant failure and rejection 02/14/2012  . Multiple sclerosis ([place]) 02/14/2012  . Thrombocytopenia ([place]) 02/12/2012  . Hypotension 02/12/2012  . Syncope and collapse 02/12/2012  . Aortic regurgitation 02/12/2012  . ESRD on hemodialysis 02/12/2012    Past Surgical History:  Procedure Laterality Date  . AV FISTULA PLACEMENT    . CARDIAC CATHETERIZATION N/A 05/26/2015   Procedure: Left Heart Cath and Coronary Angiography;  Surgeon: Leonie Man, MD;  Location: [facility] CV LAB;  Service: Cardiovascular;  Laterality: N/A;  . CARDIAC CATHETERIZATION N/A 05/26/2015   Procedure: Intravascular Pressure Wire/FFR Study;  Surgeon: Leonie Man, MD;  Location: [facility] CV LAB;  Service: Cardiovascular;  Laterality: N/A;  . CARDIAC CATHETERIZATION N/A 02/15/2016   Procedure: Left Heart Cath and Coronary Angiography;  Surgeon: Wellington Hampshire, MD;  Location: [facility] CV LAB;  Service: Cardiovascular;  Laterality: N/A;  . CARDIOVERSION N/A 07/12/2016   Procedure: CARDIOVERSION;  Surgeon: Minus Breeding, MD;  Location: [facility] ENDOSCOPY;  Service: Cardiovascular;  Laterality: N/A;  . CARDIOVERSION N/A 11/11/2016   Procedure: CARDIOVERSION;  Surgeon: Fay Records, MD;  Location: [facility];   Service: Cardiovascular;  Laterality: N/A;  . CORONARY ARTERY BYPASS GRAFT    . excised squamous cells at rectum  2006  . flash     flash pulmonary edema  . HERNIA REPAIR  07/2011  . KIDNEY TRANSPLANT  08/2007  . LEFT HEART CATHETERIZATION WITH CORONARY ANGIOGRAM N/A 01/09/2013   Procedure: LEFT HEART CATHETERIZATION WITH CORONARY ANGIOGRAM;  Surgeon: Burnell Blanks, MD;  Location: [facility] CATH LAB;  Service: Cardiovascular;  Laterality: N/A;  . TEE WITHOUT CARDIOVERSION N/A 11/11/2016   Procedure: TRANSESOPHAGEAL ECHOCARDIOGRAM (TEE);  Surgeon: Fay Records, MD;  Location: [facility] ENDOSCOPY;  Service: Cardiovascular;  Laterality: N/A;       Home Medications    Prior to Admission medications   Medication Sig Start Date End Date Taking? Authorizing Provider  acetaminophen (TYLENOL) 500 MG tablet Take 1,000 mg by mouth every 6 (six) hours as needed for pain or fever.   Yes  Historical Provider, MD  albuterol (PROAIR HFA) 108 (90 BASE) MCG/ACT inhaler Inhale 1-2 puffs into the lungs every 6 (six) hours as needed for shortness of breath.  07/19/11  Yes Historical Provider, MD  amiodarone (PACERONE) 200 MG tablet Take 1 tablet (200 mg total) by mouth daily. 09/13/16  Yes Burnell Blanks, MD  atorvastatin (LIPITOR) 40 MG tablet Take 20 mg by mouth daily at 6 PM.    Yes Historical Provider, MD  calcium acetate (PHOSLO) 667 MG capsule Take 4,002 mg by mouth 3 (three) times daily with meals.    Yes Historical Provider, MD  citalopram (CELEXA) 20 MG tablet Take 20 mg by mouth at bedtime.    Yes Historical Provider, MD  clopidogrel (PLAVIX) 75 MG tablet Take 1 tablet (75 mg total) by mouth daily. 02/19/16  Yes Dayna N Dunn, PA-C  docusate sodium (COLACE) 100 MG capsule Take 400 mg by mouth 2 (two) times daily.    Yes Historical Provider, MD  folic acid (FOLVITE) 1 MG tablet Take 1 mg by mouth daily.    Yes Historical Provider, MD  gabapentin (NEURONTIN) 300 MG capsule Take 300 mg by mouth at bedtime.     Yes Historical Provider, MD  midodrine (PROAMATINE) 10 MG tablet Take 1 tablet (10 mg total) by mouth every Monday, Wednesday, and Friday with hemodialysis. 07/14/16  Yes [name], MD  multivitamin (RENA-VIT) TABS tablet Take 1 tablet by mouth daily. 01/11/13  Yes Samella Parr, NP  OXYGEN Inhale 2 L into the lungs at bedtime. 2lpm as needed    Yes Historical Provider, MD  ranitidine (ZANTAC) 150 MG tablet Take 150 mg by mouth every morning.    Yes Historical Provider, MD  sodium polystyrene (KAYEXALATE) 15 GM/60ML suspension Take 15 g by mouth See admin instructions. Only take 15 g on Sun / Tues / Thurs / Sat (non dialysis days)   Yes Historical Provider, MD  UNABLE TO FIND Take 1 application by mouth at bedtime. Med Name: BIPAP    Yes Historical Provider, MD  warfarin (COUMADIN) 7.5 MG tablet Take 1/2-1 tablet by mouth daily as directed by coumadin clinic Patient taking differently: Take 1/2 tab by mouth daily in evenings 10/13/16  Yes Sueanne Margarita, MD  nitroGLYCERIN (NITROSTAT) 0.4 MG SL tablet Place 0.4 mg under the tongue every 5 (five) minutes as needed for chest pain (Take one tabe every 5-10 minutes for chest pain. Once you have taken three tabs you need to notify your MD or go to ER if chest pain not gone.).    Historical Provider, MD    Family History Family History  Problem Relation Age of Onset  . Heart failure Mother     started in 82s  . Emphysema Mother     smoked  . Diabetes Father   . Lupus Sister   . Heart attack Maternal Grandfather   . Heart attack Maternal Uncle   . Heart attack Maternal Aunt   . Hypertension Maternal Aunt   . Stroke Neg Hx     Social History Social History  Substance Use Topics  . Smoking status: Former Smoker    Packs/day: 2.00    Years: 20.00    Types: Cigarettes    Quit date: 08/06/1998  . Smokeless tobacco: Never Used  . Alcohol use No     Allergies   Diphenhydramine; Penicillins; Adhesive [tape]; Amoxicillin; Ativan  [lorazepam]; and Other   Review of Systems Review of Systems  Constitutional: Negative for  chills and fever.  HENT: Positive for rhinorrhea. Negative for ear pain and sore throat.   Eyes: Negative for pain and visual disturbance.  Respiratory: Positive for cough, shortness of breath and wheezing.   Cardiovascular: Negative for chest pain and palpitations.  Gastrointestinal: Negative for abdominal pain, constipation, diarrhea, nausea and vomiting.  Genitourinary: Negative for dysuria, flank pain and hematuria.  Musculoskeletal: Negative for arthralgias, back pain, neck pain and neck stiffness.  Skin: Negative for color change and rash.  Neurological: Negative for dizziness, seizures, syncope, weakness and headaches.  Psychiatric/Behavioral: Negative for agitation, behavioral problems and confusion.     Physical Exam Updated Vital Signs BP (!) 88/58 (BP Location: Right Arm)   Pulse 73   Temp 98.8 F (37.1 C) (Oral)   Resp 18   Ht '5\' 10"'$  (1.778 m)   Wt 81.3 kg   SpO2 93%   BMI 25.72 kg/m   Vitals:   11/15/16 1945 11/15/16 2000 11/15/16 2015 11/15/16 2109  BP: 114/86 106/76 92/66 (!) 88/58  Pulse: 78 73 70 73  Resp: '26 18 17 18  '$ Temp:    98.8 F (37.1 C)  TempSrc:    Oral  SpO2: 92% 93% 92% 93%  Weight:    81.3 kg  Height:    '5\' 10"'$  (1.778 m)     Physical Exam  Constitutional: He is oriented to person, place, and time. He appears well-developed and well-nourished. No distress.  HENT:  Head: Normocephalic and atraumatic.  Right Ear: External ear normal.  Left Ear: External ear normal.  Nose: Nose normal.  Mouth/Throat: Oropharynx is clear and moist. No oropharyngeal exudate.  Eyes: Conjunctivae and EOM are normal. Pupils are equal, round, and reactive to light. Right eye exhibits no discharge. Left eye exhibits no discharge. No scleral icterus.  Neck: Normal range of motion. Neck supple.  Cardiovascular: Normal rate, regular rhythm, normal heart sounds and intact  distal pulses.  Exam reveals no gallop and no friction rub.   No murmur heard. Pulmonary/Chest: No respiratory distress. He has wheezes. He has no rales.  Mild increased WOB, with diffuse wheezing and crackles at the bilateral bases  Abdominal: Soft. Bowel sounds are normal. He exhibits no distension. There is no tenderness. There is no guarding.  Musculoskeletal: Normal range of motion. He exhibits no edema or tenderness.  Neurological: He is alert and oriented to person, place, and time. He exhibits normal muscle tone.  Skin: Skin is warm and dry. No rash noted. He is not diaphoretic.  Psychiatric: He has a normal mood and affect. His behavior is normal. Judgment and thought content normal.     ED Treatments / Results  Labs (all labs ordered are listed, but only abnormal results are displayed) Labs Reviewed  CBC WITH DIFFERENTIAL/PLATELET - Abnormal; Notable for the following:       Result Value   RBC 4.10 (*)    MCV 109.0 (*)    MCH 35.1 (*)    RDW 18.7 (*)    Platelets 99 (*)    Lymphs Abs 0.5 (*)    All other components within normal limits  COMPREHENSIVE METABOLIC PANEL - Abnormal; Notable for the following:    Chloride 96 (*)    BUN 25 (*)    Creatinine, Ser 5.55 (*)    Calcium 7.7 (*)    Total Protein 8.7 (*)    Alkaline Phosphatase 136 (*)    GFR calc non Af Amer 10 (*)    GFR calc Af Wyvonnia Lora  12 (*)    All other components within normal limits  BRAIN NATRIURETIC PEPTIDE - Abnormal; Notable for the following:    B Natriuretic Peptide 1,968.8 (*)    All other components within normal limits  I-STAT TROPOININ, ED - Abnormal; Notable for the following:    Troponin i, poc 0.18 (*)    All other components within normal limits  I-STAT VENOUS BLOOD GAS, ED - Abnormal; Notable for the following:    Bicarbonate 28.1 (*)    All other components within normal limits  I-STAT TROPOININ, ED - Abnormal; Notable for the following:    Troponin i, poc 0.27 (*)    All other components  within normal limits  RESPIRATORY PANEL BY PCR  PROTIME-INR  PROTIME-INR  MAGNESIUM  PHOSPHORUS  TSH  COMPREHENSIVE METABOLIC PANEL  CBC  INFLUENZA PANEL BY PCR (TYPE A & B)  TROPONIN I  TROPONIN I  TROPONIN I    EKG  EKG Interpretation  Date/Time:  Monday November 15 2016 15:46:41 EDT Ventricular Rate:  67 PR Interval:    QRS Duration: 127 QT Interval:  423 QTC Calculation: 447 R Axis:   79 Text Interpretation:  Sinus rhythm Prolonged PR interval Consider left atrial enlargement Right bundle branch block Anteroseptal infarct, age indeterminate Confirmed by DELO  MD, DOUGLAS ([id number]) on 11/15/2016 4:47:55 PM       Radiology Dg Chest 2 View  Result Date: 11/15/2016 CLINICAL DATA:  Productive cough, shortness of breath for 3 days EXAM: CHEST  2 VIEW COMPARISON:  08/04/2016 FINDINGS: Bilateral diffuse interstitial thickening. Trace right pleural effusion. No pneumothorax. Stable cardiomegaly. Prior CABG. Prior aortic bowel replacement. No acute osseous abnormality. IMPRESSION: Cardiomegaly with mild pulmonary vascular congestion. Electronically Signed   By: Kathreen Devoid   On: 11/15/2016 16:50    Procedures Procedures (including critical care time)  Medications Ordered in ED Medications  citalopram (CELEXA) tablet 20 mg (not administered)  amiodarone (PACERONE) tablet 200 mg (not administered)  midodrine (PROAMATINE) tablet 10 mg (not administered)  atorvastatin (LIPITOR) tablet 20 mg (not administered)  clopidogrel (PLAVIX) tablet 75 mg (not administered)  docusate sodium (COLACE) capsule 400 mg (not administered)  famotidine (PEPCID) tablet 10 mg (not administered)  sodium polystyrene (KAYEXALATE) 15 GM/60ML suspension 15 g (not administered)  calcium acetate (PHOSLO) capsule 4,002 mg (not administered)  gabapentin (NEURONTIN) capsule 300 mg (not administered)  sodium chloride flush (NS) 0.9 % injection 3 mL (not administered)  acetaminophen (TYLENOL) tablet 650 mg (not  administered)    Or  acetaminophen (TYLENOL) suppository 650 mg (not administered)  HYDROcodone-acetaminophen (NORCO/VICODIN) 5-325 MG per tablet 1-2 tablet (not administered)  ondansetron (ZOFRAN) tablet 4 mg (not administered)    Or  ondansetron (ZOFRAN) injection 4 mg (not administered)  predniSONE (DELTASONE) tablet 40 mg (not administered)  levalbuterol (XOPENEX) nebulizer solution 1.25 mg (not administered)  ipratropium-albuterol (DUONEB) 0.5-2.5 (3) MG/3ML nebulizer solution 3 mL (not administered)  sodium chloride flush (NS) 0.9 % injection 3 mL (not administered)  sodium chloride flush (NS) 0.9 % injection 3 mL (not administered)  0.9 %  sodium chloride infusion (not administered)  guaiFENesin (MUCINEX) 12 hr tablet 600 mg (not administered)  sodium chloride 0.9 % bolus 250 mL (not administered)  doxycycline (VIBRAMYCIN) 100 mg in dextrose 5 % 250 mL IVPB (not administered)  ipratropium-albuterol (DUONEB) 0.5-2.5 (3) MG/3ML nebulizer solution 3 mL (3 mLs Nebulization Given 11/15/16 1609)     Initial Impression / Assessment and Plan / ED Course  I have reviewed  the triage vital signs and the nursing notes.  Pertinent labs & imaging results that were available during my care of the patient were reviewed by me and considered in my medical decision making (see chart for details).     On arrival the patient is afebrile and hemodynamically stable. Satting in the low 90s on 4 L of oxygen, which is an increased O2 requirement for him. EKG is unchanged from prior. Troponin obtained secondary to shortness of breath with exertion, which is mildly elevated to 0.18. Chest x-ray with cardiomegaly with mild pulmonary vascular congestion, but no focal consolidations to suggest pneumonia. In the absence of chest pain, suspect elevated troponin is likely in the setting of demand from fluid overload state. Venous blood gas without evidence of hypercarbia. There is no leukocytosis on labs, Hgb is  normal, and there is no hyperkalemia. BNP is currently elevated, but this is difficult to determine in the setting of dialysis.  Suspect that his increased oxygen requirement is multifactorial, likely secondary to both upper respiratory infection as well as fluid overload, possibly from heart failure exacerbation versus not completing his dialysis treatment today. Plan to admit to the hospitalist service for further management. Case discussed with nephrologist on-call, Dr. Florene Glen, who will arrange dialysis for the patient while in the hospital.  Care of patient overseen by my attending, Dr. Stark Jock.   Final Clinical Impressions(s) / ED Diagnoses   Final diagnoses:  Hypervolemia, unspecified hypervolemia type  Cough    New Prescriptions Current Discharge Medication List       Zipporah Plants, MD 11/15/16 2303    Veryl Speak, MD 11/16/16 [phone]

## 2016-11-15 NOTE — ED Notes (Signed)
Dr Adela Glimpse in room

## 2016-11-16 ENCOUNTER — Encounter (HOSPITAL_COMMUNITY): Payer: Self-pay

## 2016-11-16 DIAGNOSIS — J449 Chronic obstructive pulmonary disease, unspecified: Secondary | ICD-10-CM

## 2016-11-16 LAB — RESPIRATORY PANEL BY PCR
Adenovirus: NOT DETECTED
BORDETELLA PERTUSSIS-RVPCR: NOT DETECTED
Chlamydophila pneumoniae: NOT DETECTED
Coronavirus 229E: NOT DETECTED
Coronavirus HKU1: NOT DETECTED
Coronavirus NL63: NOT DETECTED
Coronavirus OC43: NOT DETECTED
INFLUENZA B-RVPPCR: NOT DETECTED
Influenza A H3: DETECTED — AB
METAPNEUMOVIRUS-RVPPCR: NOT DETECTED
Mycoplasma pneumoniae: NOT DETECTED
PARAINFLUENZA VIRUS 2-RVPPCR: NOT DETECTED
PARAINFLUENZA VIRUS 3-RVPPCR: NOT DETECTED
Parainfluenza Virus 1: NOT DETECTED
Parainfluenza Virus 4: NOT DETECTED
RESPIRATORY SYNCYTIAL VIRUS-RVPPCR: NOT DETECTED
Rhinovirus / Enterovirus: NOT DETECTED

## 2016-11-16 LAB — COMPREHENSIVE METABOLIC PANEL
ALT: 23 U/L (ref 17–63)
AST: 26 U/L (ref 15–41)
Albumin: 3.3 g/dL — ABNORMAL LOW (ref 3.5–5.0)
Alkaline Phosphatase: 116 U/L (ref 38–126)
Anion gap: 14 (ref 5–15)
BUN: 34 mg/dL — AB (ref 6–20)
CHLORIDE: 97 mmol/L — AB (ref 101–111)
CO2: 28 mmol/L (ref 22–32)
Calcium: 7.1 mg/dL — ABNORMAL LOW (ref 8.9–10.3)
Creatinine, Ser: 6.55 mg/dL — ABNORMAL HIGH (ref 0.61–1.24)
GFR, EST AFRICAN AMERICAN: 9 mL/min — AB (ref >60)
GFR, EST NON AFRICAN AMERICAN: 8 mL/min — AB (ref >60)
Glucose, Bld: 93 mg/dL (ref 65–99)
POTASSIUM: 4.4 mmol/L (ref 3.5–5.1)
Sodium: 139 mmol/L (ref 135–145)
Total Bilirubin: 1.1 mg/dL (ref 0.3–1.2)
Total Protein: 8 g/dL (ref 6.5–8.1)

## 2016-11-16 LAB — TROPONIN I
TROPONIN I: 0.86 ng/mL — AB (ref <0.03)
Troponin I: 0.63 ng/mL (ref <0.03)

## 2016-11-16 LAB — PHOSPHORUS: Phosphorus: 6.3 mg/dL — ABNORMAL HIGH (ref 2.5–4.6)

## 2016-11-16 LAB — CBC
HEMATOCRIT: 40.7 % (ref 39.0–52.0)
HEMOGLOBIN: 12.8 g/dL — AB (ref 13.0–17.0)
MCH: 34.4 pg — ABNORMAL HIGH (ref 26.0–34.0)
MCHC: 31.4 g/dL (ref 30.0–36.0)
MCV: 109.4 fL — AB (ref 78.0–100.0)
Platelets: 101 10*3/uL — ABNORMAL LOW (ref 150–400)
RBC: 3.72 MIL/uL — AB (ref 4.22–5.81)
RDW: 18.4 % — ABNORMAL HIGH (ref 11.5–15.5)
WBC: 7 10*3/uL (ref 4.0–10.5)

## 2016-11-16 LAB — TSH: TSH: 3.957 u[IU]/mL (ref 0.350–4.500)

## 2016-11-16 LAB — MRSA PCR SCREENING: MRSA by PCR: NEGATIVE

## 2016-11-16 LAB — PROTIME-INR
INR: 2.14
Prothrombin Time: 24.3 seconds — ABNORMAL HIGH (ref 11.4–15.2)

## 2016-11-16 LAB — MAGNESIUM: Magnesium: 2.3 mg/dL (ref 1.7–2.4)

## 2016-11-16 MED ORDER — METOPROLOL TARTRATE 12.5 MG HALF TABLET
12.5000 mg | ORAL_TABLET | Freq: Two times a day (BID) | ORAL | Status: DC
  Administered 2016-11-16 – 2016-11-17 (×4): 12.5 mg via ORAL
  Filled 2016-11-16 (×4): qty 1

## 2016-11-16 MED ORDER — METHYLPREDNISOLONE SODIUM SUCC 40 MG IJ SOLR
40.0000 mg | Freq: Two times a day (BID) | INTRAMUSCULAR | Status: DC
  Administered 2016-11-16 – 2016-11-17 (×4): 40 mg via INTRAVENOUS
  Filled 2016-11-16 (×4): qty 1

## 2016-11-16 MED ORDER — RENA-VITE PO TABS
1.0000 | ORAL_TABLET | Freq: Every day | ORAL | Status: DC
  Administered 2016-11-16 – 2016-11-17 (×2): 1 via ORAL
  Filled 2016-11-16 (×2): qty 1

## 2016-11-16 MED ORDER — OSELTAMIVIR PHOSPHATE 30 MG PO CAPS
30.0000 mg | ORAL_CAPSULE | Freq: Once | ORAL | Status: AC
  Administered 2016-11-16: 30 mg via ORAL
  Filled 2016-11-16: qty 1

## 2016-11-16 MED ORDER — OSELTAMIVIR PHOSPHATE 30 MG PO CAPS
30.0000 mg | ORAL_CAPSULE | ORAL | Status: DC
  Administered 2016-11-17: 30 mg via ORAL
  Filled 2016-11-16: qty 1

## 2016-11-16 MED ORDER — OSELTAMIVIR PHOSPHATE 30 MG PO CAPS
30.0000 mg | ORAL_CAPSULE | Freq: Every day | ORAL | Status: DC
  Administered 2016-11-16: 30 mg via ORAL
  Filled 2016-11-16: qty 1

## 2016-11-16 NOTE — Evaluation (Signed)
Physical Therapy Evaluation Patient Details Name: [name] MRN: [id number] DOB: 1956-08-08 Today's Date: 11/16/2016   History of Present Illness  [name] is a 61 y.o. male with medical history significant of CAD, bioprosthetic aortic valve replacement paroxysmal atrial fibrillation on Coumadin, end-stage renal disease on hemodialysis M,W and F. OSA, SVT, chronic respiratory failure on home oxygen, COPD.   Clinical Impression  Pt presents with decreased strength, balance and activity tolerance secondary to above. PTA pt was I, however, now functioning at supervision level. Recommend continued acute PT to return to mod-I level of function for safe transition home with 24 hour supervision at home for safety when medically ready. Acute PT will follow.     Follow Up Recommendations No PT follow up;Supervision/Assistance - 24 hour    Equipment Recommendations  None recommended by PT    Recommendations for Other Services       Precautions / Restrictions Precautions Precautions: Fall Restrictions Weight Bearing Restrictions: No      Mobility  Bed Mobility Overal bed mobility: Needs Assistance Bed Mobility: Supine to Sit     Supine to sit: Supervision;HOB elevated     General bed mobility comments: increased time, increased effort, HOB elevated  Transfers Overall transfer level: Needs assistance Equipment used: Rolling Rambo (2 wheeled) Transfers: Sit to/from Stand Sit to Stand: Supervision         General transfer comment: supervision for safety, increased time  Ambulation/Gait Ambulation/Gait assistance: Supervision Ambulation Distance (Feet): 300 Feet Assistive device: Rolling Piatt (2 wheeled) Gait Pattern/deviations: Step-through pattern;Decreased stride length;Trunk flexed;Wide base of support (excess hip ER on R LE) Gait velocity: decreased Gait velocity interpretation: Below normal speed for age/gender General Gait Details: slow speed, v/c for RW  safety and upright posture, attempted amb without RW for 5 steps, however, pt unsteady and required physical assist to maintain balance so returned to [employer] Mobility    Modified Rankin (Stroke Patients Only)       Balance Overall balance assessment: Needs assistance Sitting-balance support: Feet supported;No upper extremity supported Sitting balance-Leahy Scale: Good Sitting balance - Comments: sat EOB x 1 min without LOB   Standing balance support: No upper extremity supported;Bilateral upper extremity supported Standing balance-Leahy Scale: Fair Standing balance comment: able to stand without UE assist to don second gown for amb, however, requires B UE assist/physical assist for amb                             Pertinent Vitals/Pain Pain Assessment: No/denies pain    Home Living Family/patient expects to be discharged to:: Private residence Living Arrangements: Spouse/significant other Available Help at Discharge: Family;Available 24 hours/day Type of Home: House Home Access: Other (comment) (lip at doorway)     Home Layout: One level Home Equipment: [place] - 2 wheels;Cane - single point;Shower seat;Grab bars - tub/shower      Prior Function Level of Independence: Independent         Comments: driving PTA     Hand Dominance   Dominant Hand: Right    Extremity/Trunk Assessment   Upper Extremity Assessment Upper Extremity Assessment: Generalized weakness    Lower Extremity Assessment Lower Extremity Assessment: Generalized weakness    Cervical / Trunk Assessment Cervical / Trunk Assessment: Normal  Communication   Communication: No difficulties  Cognition Arousal/Alertness: Awake/alert Behavior During Therapy: Impulsive;WFL for tasks assessed/performed Overall Cognitive Status:  Within Functional Limits for tasks assessed                 General Comments: impulsively stood and began to walk with RW,  however, stopped and behavior prior and following were within functional limits    General Comments      Exercises     Assessment/Plan    PT Assessment Patient needs continued PT services  PT Problem List Decreased strength;Decreased activity tolerance;Decreased balance;Decreased mobility;Decreased knowledge of use of DME       PT Treatment Interventions DME instruction;Gait training;Functional mobility training;Therapeutic activities;Therapeutic exercise;Balance training;Patient/family education    PT Goals (Current goals can be found in the Care Plan section)  Acute Rehab PT Goals Patient Stated Goal: go home PT Goal Formulation: With patient Time For Goal Achievement: 11/30/16 Potential to Achieve Goals: Good    Frequency Min 3X/week   Barriers to discharge        Co-evaluation               End of Session Equipment Utilized During Treatment: Gait belt;Oxygen Activity Tolerance: Patient tolerated treatment well Patient left: in chair;with call bell/phone within reach;with family/visitor present Nurse Communication: Mobility status PT Visit Diagnosis: Unsteadiness on feet (R26.81);Other abnormalities of gait and mobility (R26.89);Muscle weakness (generalized) (M62.81);Difficulty in walking, not elsewhere classified (R26.2)         Time: 2482-5003 PT Time Calculation (min) (ACUTE ONLY): 19 min   Charges:   PT Evaluation $PT Eval Low Complexity: 1 Procedure     PT G CodesTracie Harrier November 26, 2016, 12:41 PM   Tracie Harrier, SPT Acute Rehab SPT [phone]

## 2016-11-16 NOTE — Consult Note (Signed)
[facility] Renal Consultation Note    Indication for Consultation:  Management of ESRD/hemodialysis; anemia, hypertension/volume and secondary hyperparathyroidism  HPI: [name] is a 61 y.o. male with ESRD on hemodialysis MWF. PMH includes failed renal tranplant, COPD, H/O PAF/Aflutter on Coumadin,  CAD with multiple stents,CABG STEMI 06/17 valvular heart disease s/p AVR with bioprosthetic valve, HTN, multiple sclerosis, OSA, S/P parathyroidectomy.  [name] is seen currently in his room with wife at bedside. He presented to ED yesterday after dialysis with dyspnea, and cough. He did have episode of hypotension during HD yesterday and continued feeling bad after treatment was ended. His wife reports progressive SOB, productive cough and malaise over last few days.  Had nebulizer treatment in ED and getting another treatment currently. He reports his breathing improved some since admission  O2 sats 90-93% on 4L [place] but not wearing when I examined him. CXR with mild pulmonary vascular congestion Influenza A+ He his admitted for hypoxia likely related to COPD/CHF exacerbation.   Last HD was Monday 3/12. He left 81.7kg, 1.7 kg over his EDW. He was not able to meet EDW with hypotensive episode. Has been compliant with HD, no missed treatments.   Past Medical History:  Diagnosis Date  . Acute blood loss anemia    a. 02/2016 due to groin hematoma. Rec 3 U PRBC.  Marland Kitchen Anemia   . Aortic heart valve prolapse 04/2013   a. s/p bioprosthetic AVR at time of CABG.  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 Bifascicular block   . CAD (coronary artery disease) 04/2013   a. 04/2013: s/p CABG x 2 (Y SVG -LAD & D2). b. 01/2015: NSTEMI s/p DES to LCx, BMS to SVG-LAD. c. NSTEMI 05/2015 during AF/AFL - non-flow limiting FFR. d. Low risk nuc 3/17. e. STEMI 6/17 after coming off Plavix, s/p DES to SVG-LAD into native vessel.  . Carotid artery disease ([place])    a. 1-39% stenosis bilaterally  in 06/2015.   Marland Kitchen Chronic respiratory failure ([place])   . 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 b. He had a renal Tx from 2008 to 2011, but subsequent rejection - Gets HD in [place], [place] on MWF schedule.    . Hematoma    a. Large right groin hematoma after cath 02/2016 with associated ABL anemia.  . Hypertension   . Multiple sclerosis ([place])   . Nocturnal hypoxemia   . On home oxygen therapy    pt states he has not been wearing it  . PAF (paroxysmal atrial fibrillation) ([place])    a. h/o, placed on amiodarone 07/2016 due to recurrence.  . Paroxysmal atrial flutter ([place])    a. During 05/2015 admission - SVT, atrial flutter, and PAF.  Marland Kitchen Peripheral vascular disease ([place])    Cath in 01/2015 required 45 cm [place]  . Renal transplant failure and rejection   . 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 intolerant of bipap.  Marland Kitchen SVT (supraventricular tachycardia) ([place])   . Valvular heart disease    a. 2D Echo 03/25/16: mild LVH, EF 50-55%, grade 2 Dd, AVR present with mild AI, mod MR, severely dilated LA, mildly dilated RV, mod RAE, PASP 72.   Past Surgical History:  Procedure Laterality Date  . AV FISTULA PLACEMENT    . CARDIAC CATHETERIZATION N/A 05/26/2015   Procedure:  Left Heart Cath and Coronary Angiography;  Surgeon: Leonie Man, MD;  Location: [facility];  Service: Cardiovascular;  Laterality: N/A;  . CARDIAC CATHETERIZATION N/A 05/26/2015   Procedure: Intravascular Pressure Wire/FFR Study;  Surgeon: Leonie Man, MD;  Location: [facility];  Service: Cardiovascular;  Laterality: N/A;  . CARDIAC CATHETERIZATION N/A 02/15/2016   Procedure: Left Heart Cath and Coronary Angiography;  Surgeon: Wellington Hampshire, MD;  Location: [facility];  Service: Cardiovascular;  Laterality: N/A;  . CARDIOVERSION N/A 07/12/2016   Procedure: CARDIOVERSION;  Surgeon: Minus Breeding, MD;  Location: [facility]  ENDOSCOPY;  Service: Cardiovascular;  Laterality: N/A;  . CARDIOVERSION N/A 11/11/2016   Procedure: CARDIOVERSION;  Surgeon: Fay Records, MD;  Location: [facility];  Service: Cardiovascular;  Laterality: N/A;  . CORONARY ARTERY BYPASS GRAFT    . excised squamous cells at rectum  2006  . flash     flash pulmonary edema  . HERNIA REPAIR  07/2011  . KIDNEY TRANSPLANT  08/2007  . LEFT HEART CATHETERIZATION WITH CORONARY ANGIOGRAM N/A 01/09/2013   Procedure: LEFT HEART CATHETERIZATION WITH CORONARY ANGIOGRAM;  Surgeon: Burnell Blanks, MD;  [facility] CATH LAB;  Service: Cardiovascular;  Laterality: N/A;  . TEE WITHOUT CARDIOVERSION N/A 11/11/2016   Procedure: TRANSESOPHAGEAL ECHOCARDIOGRAM (TEE);  Surgeon: Fay Records, MD;  Location: [facility] ENDOSCOPY;  Service: Cardiovascular;  Laterality: N/A;   Family History  Problem Relation Age of Onset  . Heart failure Mother     started in 74s  . Emphysema Mother     smoked  . Diabetes Father   . Lupus Sister   . Heart attack Maternal Grandfather   . Heart attack Maternal Uncle   . Heart attack Maternal Aunt   . Hypertension Maternal Aunt   . Stroke Neg Hx    Social History:  reports that he quit smoking about 18 years ago. His smoking use included Cigarettes. He has a 40.00 pack-year smoking history. He has never used smokeless tobacco. He reports that he does not drink alcohol or use drugs. Allergies  Allergen Reactions  . Diphenhydramine Itching    Only with IV doses. Tolerates oral.  . Penicillins Other (See Comments)    migraine  . Adhesive [Tape] Rash    Please use paper tape  . Amoxicillin Rash    migraine  . Ativan [Lorazepam] Anxiety  . Other Rash    Please use paper tape   Prior to Admission medications   Medication Sig Start Date End Date Taking? Authorizing Provider  acetaminophen (TYLENOL) 500 MG tablet Take 1,000 mg by mouth every 6 (six) hours as needed for pain or fever.   Yes Historical Provider, MD  albuterol  (PROAIR HFA) 108 (90 BASE) MCG/ACT inhaler Inhale 1-2 puffs into the lungs every 6 (six) hours as needed for shortness of breath.  07/19/11  Yes Historical Provider, MD  amiodarone (PACERONE) 200 MG tablet Take 1 tablet (200 mg total) by mouth daily. 09/13/16  Yes Burnell Blanks, MD  atorvastatin (LIPITOR) 40 MG tablet Take 20 mg by mouth daily at 6 PM.    Yes Historical Provider, MD  calcium acetate (PHOSLO) 667 MG capsule Take 4,002 mg by mouth 3 (three) times daily with meals.    Yes Historical Provider, MD  citalopram (CELEXA) 20 MG tablet Take 20 mg by mouth at bedtime.    Yes Historical Provider, MD  clopidogrel (PLAVIX) 75 MG tablet Take 1 tablet (75 mg total)  by mouth daily. 02/19/16  Yes Dayna N Dunn, PA-C  docusate sodium (COLACE) 100 MG capsule Take 400 mg by mouth 2 (two) times daily.    Yes Historical Provider, MD  folic acid (FOLVITE) 1 MG tablet Take 1 mg by mouth daily.    Yes Historical Provider, MD  gabapentin (NEURONTIN) 300 MG capsule Take 300 mg by mouth at bedtime.    Yes Historical Provider, MD  midodrine (PROAMATINE) 10 MG tablet Take 1 tablet (10 mg total) by mouth every Monday, Wednesday, and Friday with hemodialysis. 07/14/16  Yes [name], MD  multivitamin (RENA-VIT) TABS tablet Take 1 tablet by mouth daily. 01/11/13  Yes Samella Parr, NP  OXYGEN Inhale 2 L into the lungs at bedtime. 2lpm as needed    Yes Historical Provider, MD  ranitidine (ZANTAC) 150 MG tablet Take 150 mg by mouth every morning.    Yes Historical Provider, MD  sodium polystyrene (KAYEXALATE) 15 GM/60ML suspension Take 15 g by mouth See admin instructions. Only take 15 g on Sun / Tues / Thurs / Sat (non dialysis days)   Yes Historical Provider, MD  UNABLE TO FIND Take 1 application by mouth at bedtime. Med Name: BIPAP    Yes Historical Provider, MD  warfarin (COUMADIN) 7.5 MG tablet Take 1/2-1 tablet by mouth daily as directed by coumadin clinic Patient taking differently: Take 1/2 tab by mouth  daily in evenings 10/13/16  Yes Sueanne Margarita, MD  nitroGLYCERIN (NITROSTAT) 0.4 MG SL tablet Place 0.4 mg under the tongue every 5 (five) minutes as needed for chest pain (Take one tabe every 5-10 minutes for chest pain. Once you have taken three tabs you need to notify your MD or go to ER if chest pain not gone.).    Historical Provider, MD   Current Facility-Administered Medications  Medication Dose Route Frequency Provider Last Rate Last Dose  . 0.9 %  sodium chloride infusion  250 mL Intravenous PRN Toy Baker, MD      . acetaminophen (TYLENOL) tablet 650 mg  650 mg Oral Q6H PRN Toy Baker, MD       Or  . acetaminophen (TYLENOL) suppository 650 mg  650 mg Rectal Q6H PRN Toy Baker, MD      . amiodarone (PACERONE) tablet 200 mg  200 mg Oral Daily Toy Baker, MD   200 mg at 11/16/16 2878  . atorvastatin (LIPITOR) tablet 20 mg  20 mg Oral q1800 Toy Baker, MD      . calcium acetate (PHOSLO) capsule 4,002 mg  4,002 mg Oral TID WC Toy Baker, MD   4,002 mg at 11/16/16 1211  . citalopram (CELEXA) tablet 20 mg  20 mg Oral QHS Toy Baker, MD   20 mg at 11/15/16 2311  . clopidogrel (PLAVIX) tablet 75 mg  75 mg Oral Daily Toy Baker, MD   75 mg at 11/16/16 0838  . docusate sodium (COLACE) capsule 400 mg  400 mg Oral BID Toy Baker, MD   400 mg at 11/16/16 0835  . famotidine (PEPCID) tablet 10 mg  10 mg Oral Daily Toy Baker, MD   10 mg at 11/16/16 0837  . gabapentin (NEURONTIN) capsule 300 mg  300 mg Oral QHS Toy Baker, MD   300 mg at 11/15/16 2311  . guaiFENesin (MUCINEX) 12 hr tablet 600 mg  600 mg Oral BID Toy Baker, MD   600 mg at 11/16/16 0837  . HYDROcodone-acetaminophen (NORCO/VICODIN) 5-325 MG per tablet 1-2 tablet  1-2 tablet  Oral Q4H PRN Toy Baker, MD      . ipratropium-albuterol (DUONEB) 0.5-2.5 (3) MG/3ML nebulizer solution 3 mL  3 mL Nebulization QID Toy Baker, MD   3 mL at  11/16/16 1223  . levalbuterol (XOPENEX) nebulizer solution 1.25 mg  1.25 mg Nebulization Q1H PRN Toy Baker, MD   1.25 mg at 11/16/16 0614  . methylPREDNISolone sodium succinate (SOLU-MEDROL) 40 mg/mL injection 40 mg  40 mg Intravenous Q12H Domenic Polite, MD   40 mg at 11/16/16 1245  . metoprolol tartrate (LOPRESSOR) tablet 12.5 mg  12.5 mg Oral BID Domenic Polite, MD   12.5 mg at 11/16/16 1116  . [START ON 11/17/2016] midodrine (PROAMATINE) tablet 10 mg  10 mg Oral Q M,W,F-HD Toy Baker, MD      . ondansetron (ZOFRAN) tablet 4 mg  4 mg Oral Q6H PRN Toy Baker, MD       Or  . ondansetron (ZOFRAN) injection 4 mg  4 mg Intravenous Q6H PRN Toy Baker, MD      . Derrill Memo ON 11/17/2016] oseltamivir (TAMIFLU) capsule 30 mg  30 mg Oral Q M,W,F-1800 Romona Curls, RPH      . sodium chloride flush (NS) 0.9 % injection 3 mL  3 mL Intravenous Q12H Toy Baker, MD   3 mL at 11/16/16 1000  . sodium chloride flush (NS) 0.9 % injection 3 mL  3 mL Intravenous Q12H Toy Baker, MD   3 mL at 11/16/16 1000  . sodium chloride flush (NS) 0.9 % injection 3 mL  3 mL Intravenous PRN Toy Baker, MD      . sodium polystyrene (KAYEXALATE) 15 GM/60ML suspension 15 g  15 g Oral Q T,Th,S,Su-1800 Anastassia Doutova, MD      . warfarin (COUMADIN) tablet 3.75 mg  3.75 mg Oral q1800 Laren Everts, RPH   3.75 mg at 11/16/16 0015  . Warfarin - Pharmacist Dosing Inpatient   Does not apply q1800 Laren Everts, RPH         ROS: As per HPI otherwise negative.  Physical Exam: Vitals:   11/16/16 0552 11/16/16 0621 11/16/16 0919 11/16/16 1224  BP: 103/81     Pulse: (!) 101     Resp:      Temp: 98.6 F (37 C)     TempSrc: Oral     SpO2: 90% 94% 93% 91%  Weight:      Height:         General: WDWN NAD Head: NCAT sclera not icteric MMM Neck: Supple. No JVD No masses Lungs: Getting neb treatment ; diffuse wheezes,  Heart: RRR with S1 S2 Abdomen: soft NT + BS Lower  extremities:without edema or ischemic changes, no open wounds  Neuro: A & O  X 3. Moves all extremities spontaneously. Psych:  Responds to questions appropriately with a normal affect. Dialysis Access: LUE AVF +thrill   Labs: Basic Metabolic Panel:  Recent Labs Lab 11/11/16 0929 11/15/16 1550 11/16/16 0444  NA 140 140 139  K 4.6 4.8 4.4  CL 98* 96* 97*  CO2 '29 31 28  '$ GLUCOSE 84 96 93  BUN 31* 25* 34*  CREATININE 6.28* 5.55* 6.55*  CALCIUM 7.5* 7.7* 7.1*  PHOS  --   --  6.3*   Liver Function Tests:  Recent Labs Lab 11/15/16 1550 11/16/16 0444  AST 29 26  ALT 27 23  ALKPHOS 136* 116  BILITOT 1.1 1.1  PROT 8.7* 8.0  ALBUMIN 3.6 3.3*   No results for input(s):  LIPASE, AMYLASE in the last 168 hours. No results for input(s): AMMONIA in the last 168 hours. CBC:  Recent Labs Lab 11/11/16 0929 11/15/16 1550 11/16/16 0444  WBC 6.2 7.1 7.0  NEUTROABS  --  5.8  --   HGB 13.8 14.4 12.8*  HCT 42.9 44.7 40.7  MCV 110.9* 109.0* 109.4*  PLT 126* 99* 101*   Cardiac Enzymes:  Recent Labs Lab 11/15/16 2215 11/16/16 0444 11/16/16 1003  TROPONINI 0.71* 0.86* 0.63*   CBG: No results for input(s): GLUCAP in the last 168 hours. Iron Studies: No results for input(s): IRON, TIBC, TRANSFERRIN, FERRITIN in the last 72 hours. Studies/Results: Dg Chest 2 View  Result Date: 11/15/2016 CLINICAL DATA:  Productive cough, shortness of breath for 3 days EXAM: CHEST  2 VIEW COMPARISON:  08/04/2016 FINDINGS: Bilateral diffuse interstitial thickening. Trace right pleural effusion. No pneumothorax. Stable cardiomegaly. Prior CABG. Prior aortic bowel replacement. No acute osseous abnormality. IMPRESSION: Cardiomegaly with mild pulmonary vascular congestion. Electronically Signed   By: Kathreen Devoid   On: 11/15/2016 16:50    Dialysis Orders:  [facility] MWF 4h 160F BFR 450 2/2.25 Prof 2 EDW 80 kg LAVF Heparin 2000 U  -No VDRA -No ESA   Assessment/Plan: 1.  Dyspnea- COPD/CHF  exacerbation -CXR mild pulm congestion - per primary /HD tomorrow for  volume removal  2. Influenza A+ - on Tamiflu renal dosing  3.  ESRD -  MWF - cont on schedule  4.  Hypertension/volume  - Hypotensive on midodrine as OP / 1.7 kg over EDW plan UF to dry weight  5.  Anemia  - Hgb 14.4 no esa needed  6.  Metabolic bone disease -  Cont Ca acetate binder for ^P s/p parathyroidectomy no VDRA  7.  Nutrition - renal diet/vitamins  8. PAF - on coumadin - dosing per pharmacy   Lynnda Child PA-C [place] Pager [phone] 11/16/2016, 12:34 PM   I have seen and examined this patient and agree with plan and assessment in the above note with renal recommendations/intervention highlighted.  His SOB is most consistent with influenza A and underlying COPD.  Will re-evaluate his EDW as he has not been getting to his weight and has been having hypotension. Will plan for HD again tomorrow and follow post weights and respiratory status.  Broadus John A Calandra Madura,MD 11/16/2016 2:01 PM

## 2016-11-16 NOTE — Consult Note (Signed)
Cardiology Consult    Patient ID: [name] MRN: [id number], DOB/AGE: 1956/05/15   Admit date: 11/15/2016 Date of Consult: 11/16/2016  Primary Physician: Teressa Lower, MD Primary Cardiologist: Dr. Julianne Handler Requesting Provider: Dr. 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 He presented to [facility] following a bout of hypotension and shortness of breath during his regular dialysis treatment. Cardiology was consulted for an elevated troponin.  Past Medical History   Past Medical History:  Diagnosis Date  . Acute blood loss anemia    a. 02/2016 due to groin hematoma. Rec 3 U PRBC.  Marland Kitchen Anemia   . Aortic heart valve prolapse 04/2013   a. s/p bioprosthetic AVR at time of CABG.  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 Bifascicular block   . CAD (coronary artery disease) 04/2013   a. 04/2013: s/p CABG x 2 (Y SVG -LAD & D2). b. 01/2015: NSTEMI s/p DES to LCx, BMS to SVG-LAD. c. NSTEMI 05/2015 during AF/AFL - non-flow limiting FFR. d. Low risk nuc 3/17. e. STEMI 6/17 after coming off Plavix, s/p DES to SVG-LAD into native vessel.  . Carotid artery disease ([place])    a. 1-39% stenosis bilaterally in 06/2015.   Marland Kitchen Chronic respiratory failure ([place])   . 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 b. He had a renal Tx from 2008 to 2011, but subsequent rejection - Gets HD in [place], [place] on MWF schedule.    . Hematoma    a. Large right groin hematoma after cath 02/2016 with associated ABL anemia.  . Hypertension   . Multiple sclerosis ([place])   . Nocturnal hypoxemia   . On home oxygen therapy    pt states he has not been wearing it  . PAF (paroxysmal atrial fibrillation) ([place])    a. h/o, placed on amiodarone 07/2016 due to recurrence.  . Paroxysmal atrial flutter ([place])    a. During 05/2015 admission - SVT, atrial flutter, and PAF.  Marland Kitchen Peripheral vascular disease ([place])    Cath in 01/2015 required 45 cm Destination [place]  . Renal transplant failure and rejection   . 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 intolerant of bipap.  Marland Kitchen SVT (supraventricular tachycardia) ([place])   . Valvular heart disease    a. 2D Echo 03/25/16: mild LVH, EF 50-55%, grade 2 Dd, AVR present with mild AI, mod Cameron, severely dilated LA, mildly dilated RV, mod RAE, PASP 72.    Past Surgical History:  Procedure Laterality Date  . AV FISTULA PLACEMENT    . CARDIAC CATHETERIZATION N/A 05/26/2015   Procedure: Left Heart Cath and Coronary Angiography;  Surgeon: Leonie Man, MD;  Location: [facility]  INVASIVE CV LAB;  Service: Cardiovascular;  Laterality: N/A;  . CARDIAC CATHETERIZATION N/A 05/26/2015   Procedure: Intravascular Pressure Wire/FFR Study;  Surgeon: Leonie Man, MD;  Location: [place] CV LAB;  Service: Cardiovascular;  Laterality: N/A;  . CARDIAC CATHETERIZATION N/A 02/15/2016   Procedure: Left Heart Cath and Coronary Angiography;  Surgeon: Wellington Hampshire, MD;  Location: [place] CV LAB;  Service: Cardiovascular;  Laterality: N/A;  . CARDIOVERSION N/A 07/12/2016   Procedure: CARDIOVERSION;  Surgeon: Minus Breeding, MD;  Location: [facility] ENDOSCOPY;  Service: Cardiovascular;  Laterality: N/A;  . CARDIOVERSION N/A 11/11/2016    Procedure: CARDIOVERSION;  Surgeon: Fay Records, MD;  Location: [place];  Service: Cardiovascular;  Laterality: N/A;  . CORONARY ARTERY BYPASS GRAFT    . excised squamous cells at rectum  2006  . flash     flash pulmonary edema  . HERNIA REPAIR  07/2011  . KIDNEY TRANSPLANT  08/2007  . LEFT HEART CATHETERIZATION WITH CORONARY ANGIOGRAM N/A 01/09/2013   Procedure: LEFT HEART CATHETERIZATION WITH CORONARY ANGIOGRAM;  Surgeon: Burnell Blanks, MD;  Location: [facility] CATH LAB;  Service: Cardiovascular;  Laterality: N/A;  . TEE WITHOUT CARDIOVERSION N/A 11/11/2016   Procedure: TRANSESOPHAGEAL ECHOCARDIOGRAM (TEE);  Surgeon: Fay Records, MD;  Location: [facility] ENDOSCOPY;  Service: Cardiovascular;  Laterality: N/A;     Allergies  Allergies  Allergen Reactions  . Diphenhydramine Itching    Only with IV doses. Tolerates oral.  . Penicillins Other (See Comments)    migraine  . Adhesive [Tape] Rash    Please use paper tape  . Amoxicillin Rash    migraine  . Ativan [Lorazepam] Anxiety  . 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  In review, he underwent a recent nuc in 11/2015 in preparation for repeat transplant eval  That was low risk at [facility]. In 01/2016, he was cleared to stop Plavix  but remained on ASA + Coumadin. Per notes, it was felt by the transplant team that he would need to be off Plavix to be considered for transplantation. He was admitted 02/16/16 with midsternal chest pain with radiation to his jaw/teeth. EKG showed anterior STEMI. LHC 02/15/16: acute occlusion of SVG-LAD at anastamosis, patent LCx with moderate restenosis, moderate RCA disease, patent Y graft to diagonal with moderate ostial stenosis, s/p angioplasty/DES to SVG-LAD extending into the native vessel - this was a difficult procedure c/b large right groin hematoma. 2D 02/15/16: EF 55-60%, HK basal mid ateroseptal myocardium and HK of apical anterior myocardium, calcified AV with mild AS, mild AI, calcified MV with mobility restriction of posterior leaflet with mild Cameron, mod dilated LAE, PASP 14mHg.   In Jan 2018, pt had prolonged QTc; amiodarone was decreased to 200 mg BID at that time and SSRI discontinued.  He was last seen in Afib clinic on 11/11/16 and was in atrial flutter with marginal pressures. He underwent TEE/DCCV on 11/11/16 and was converted to sinus rhythm with first degree heart block. Since then, he became SOB  With wheezing and congestion. During his dialysis on 11/15/16 he became hypotensive with worsening shortness of breath, cough and congestion. He reports cough and wheezing since TEE/DCCV.   On my interview, he denies chest pain. He states that he started having shortness of breath, wheezing, and congestion following his recent cardioversion on 11/11/16. SOB continued and he became hypotensive during his regular dialysis treatment. Dialysis was discontinued and he was brought to the ED. He denies chest pain and palpitations. Last evening, he required several breathing treatments before he was able to rest comfortably. Today, he is in no distress and is wearing supplemental O2 which is his baseline. Telemetry reveals NSR with PVCs. Lopressor and amiodarone have been continued for rate and rhythm control.  Coumadin has been continued for [facility] for history of PAF.    Inpatient Medications    . amiodarone  200 mg Oral Daily  . atorvastatin  20 mg Oral q1800  . calcium acetate  4,002 mg Oral TID WC  . citalopram  20 mg Oral QHS  . clopidogrel  75 mg Oral Daily  . docusate sodium  400 mg Oral BID  . famotidine  10 mg Oral Daily  . gabapentin  300 mg Oral QHS  . guaiFENesin  600 mg Oral BID  . ipratropium-albuterol  3 mL Nebulization QID  . methylPREDNISolone (SOLU-MEDROL) injection  40 mg Intravenous Q12H  . [START ON 11/17/2016] midodrine  10 mg Oral Q M,W,F-HD  . oseltamivir  30 mg Oral Daily  . sodium chloride flush  3 mL Intravenous Q12H  . sodium chloride flush  3 mL Intravenous Q12H  . sodium polystyrene  15 g Oral Q T,Th,S,Su-1800  . warfarin  3.75 mg Oral q1800  . Warfarin - Pharmacist Dosing Inpatient   Does not apply q1800     Outpatient Medications    Prior to Admission medications   Medication Sig Start Date End Date Taking? Authorizing Provider  acetaminophen (TYLENOL) 500 MG tablet Take 1,000 mg by mouth every 6 (six) hours as needed for pain or fever.   Yes Historical Provider, MD  albuterol (PROAIR HFA) 108 (90 BASE) MCG/ACT inhaler Inhale 1-2 puffs into the lungs every 6 (six) hours as needed for shortness of breath.  07/19/11  Yes Historical Provider, MD  amiodarone (PACERONE) 200 MG tablet Take 1 tablet (200 mg total) by mouth daily. 09/13/16  Yes Burnell Blanks, MD  atorvastatin (LIPITOR) 40 MG tablet Take 20 mg by mouth daily at 6 PM.    Yes Historical Provider, MD  calcium acetate (PHOSLO) 667 MG capsule Take 4,002 mg by mouth 3 (three) times daily with meals.    Yes Historical Provider, MD  citalopram (CELEXA) 20 MG tablet Take 20 mg by mouth at bedtime.    Yes Historical Provider, MD  clopidogrel (PLAVIX) 75 MG tablet Take 1 tablet (75 mg total) by mouth daily. 02/19/16  Yes Dayna N Dunn, PA-C  docusate sodium (COLACE) 100 MG capsule Take 400 mg by mouth 2  (two) times daily.    Yes Historical Provider, MD  folic acid (FOLVITE) 1 MG tablet Take 1 mg by mouth daily.    Yes Historical Provider, MD  gabapentin (NEURONTIN) 300 MG capsule Take 300 mg by mouth at bedtime.  Yes Historical Provider, MD  midodrine (PROAMATINE) 10 MG tablet Take 1 tablet (10 mg total) by mouth every Monday, Wednesday, and Friday with hemodialysis. 07/14/16  Yes [name], MD  multivitamin (RENA-VIT) TABS tablet Take 1 tablet by mouth daily. 01/11/13  Yes Samella Parr, NP  OXYGEN Inhale 2 L into the lungs at bedtime. 2lpm as needed    Yes Historical Provider, MD  ranitidine (ZANTAC) 150 MG tablet Take 150 mg by mouth every morning.    Yes Historical Provider, MD  sodium polystyrene (KAYEXALATE) 15 GM/60ML suspension Take 15 g by mouth See admin instructions. Only take 15 g on Sun / Tues / Thurs / Sat (non dialysis days)   Yes Historical Provider, MD  UNABLE TO FIND Take 1 application by mouth at bedtime. Med Name: BIPAP    Yes Historical Provider, MD  warfarin (COUMADIN) 7.5 MG tablet Take 1/2-1 tablet by mouth daily as directed by coumadin clinic Patient taking differently: Take 1/2 tab by mouth daily in evenings 10/13/16  Yes Sueanne Margarita, MD  nitroGLYCERIN (NITROSTAT) 0.4 MG SL tablet Place 0.4 mg under the tongue every 5 (five) minutes as needed for chest pain (Take one tabe every 5-10 minutes for chest pain. Once you have taken three tabs you need to notify your MD or go to ER if chest pain not gone.).    Historical Provider, MD     Family History    Family History  Problem Relation Age of Onset  . Heart failure Mother     started in 48s  . Emphysema Mother     smoked  . Diabetes Father   . Lupus Sister   . Heart attack Maternal Grandfather   . Heart attack Maternal Uncle   . Heart attack Maternal Aunt   . Hypertension Maternal Aunt   . Stroke Neg Hx     Social History    Social History   Social History  . Marital status: Married    Spouse name: N/A   . Number of children: N/A  . Years of education: N/A   Occupational History  . Disabled    Social History Main Topics  . Smoking status: Former Smoker    Packs/day: 2.00    Years: 20.00    Types: Cigarettes    Quit date: 08/06/1998  . Smokeless tobacco: Never Used  . Alcohol use No  . Drug use: No  . Sexual activity: Not on file   Other Topics Concern  . Not on file   Social History Narrative   No history of premature CAD in either parents or siblings. However, his maternal grandfather and 2 maternal uncles had coronary artery disease.     Review of Systems    General:  No chills, fever, night sweats or weight changes.  Cardiovascular:  No chest pain, dyspnea on exertion, orthopnea, palpitations, paroxysmal nocturnal dyspnea. Positive for edema Dermatological: No rash, lesions/masses Respiratory: Positive cough, dyspnea, congestion Urologic: No hematuria, dysuria Abdominal:   No nausea, vomiting, diarrhea, bright red blood per rectum, melena, or hematemesis Neurologic:  No visual changes, changes in mental status. All other systems reviewed and are otherwise negative except as noted above.  Physical Exam    Blood pressure 103/81, pulse (!) 101, temperature 98.6 F (37 C), temperature source Oral, resp. rate 18, height 5' 10"  (1.778 m), weight 179 lb 3.7 oz (81.3 kg), SpO2 93 %.  General: Pleasant, NAD Psych: Normal affect. Neuro: Alert and oriented X 3. Moves all  extremities spontaneously. HEENT: Normal  Neck: Supple without bruits or JVD. Lungs:  Respirations unlabored, but coarse sounds throughout bilaterally. Heart: RRR no s3, s4, 2/6 systolic murmur. Abdomen: Soft, non-tender, non-distended, BS + x 4.  Extremities: No clubbing, cyanosis or Trace LE edema with venous stasis changes.Marland Kitchen DP/PT/Radials 1+ and equal bilaterally. Left AV fistula  Labs    Troponin [facility] Test)  Recent Labs  11/15/16 1936  TROPIPOC 0.27*    Recent Labs  11/15/16 2215  11/16/16 0444  TROPONINI 0.71* 0.86*   Lab Results  Component Value Date   WBC 7.0 11/16/2016   HGB 12.8 (L) 11/16/2016   HCT 40.7 11/16/2016   MCV 109.4 (H) 11/16/2016   PLT 101 (L) 11/16/2016    Recent Labs Lab 11/16/16 0444  NA 139  K 4.4  CL 97*  CO2 28  BUN 34*  CREATININE 6.55*  CALCIUM 7.1*  PROT 8.0  BILITOT 1.1  ALKPHOS 116  ALT 23  AST 26  GLUCOSE 93   Lab Results  Component Value Date   CHOL 98 (L) 03/05/2016   HDL 32 (L) 03/05/2016   LDLCALC 37 03/05/2016   TRIG 143 03/05/2016   No results found for: [facility]   Radiology Studies    Dg Chest 2 View  Result Date: 11/15/2016 CLINICAL DATA:  Productive cough, shortness of breath for 3 days EXAM: CHEST  2 VIEW COMPARISON:  08/04/2016 FINDINGS: Bilateral diffuse interstitial thickening. Trace right pleural effusion. No pneumothorax. Stable cardiomegaly. Prior CABG. Prior aortic bowel replacement. No acute osseous abnormality. IMPRESSION: Cardiomegaly with mild pulmonary vascular congestion. Electronically Signed   By: Kathreen Devoid   On: 11/15/2016 16:50    ECG & Cardiac Imaging    EKG 11/16/16: Sinus rhythm with sinus arrhythmia with 1st degree A-V block with occasional and consecutive Premature ventricular complexes, incomplete RBBB   Echocardiogram (TEE) 11/11/16 (pre-DCCV): Left ventricle:  LVEF is grossly normal. Aortic valve:  AV bioprosthesis is difficult to see No significant AI. Mitral valve:  MV is thickened, calcified. There is restricted motion of the posterior leaflet. Cameron is directed posterior into LA and goest around back of left atrium. It is moderate to severe in intensity No backflow is seen in pulmonary veins. Left atrium:   No evidence of thrombus in the atrial cavity or appendage. Atrial septum:  Atrial septum bulges towards LA consistent with increased R sided pressures. Right ventricle:  RV is dilated and RVEF is moderate to severely depressed. Pulmonic valve:   PV is normal Trace  PI. Tricuspid valve:  TV is normal Mild TR. Right atrium:  RA is enlarged    Echocardiogram 03/25/16: Study Conclusions - Left ventricle: The cavity size was normal. Wall thickness was   increased in a pattern of mild LVH. Systolic function was normal.   The estimated ejection fraction was in the range of 50% to 55%.   Wall motion was normal; there were no regional wall motion   abnormalities. Features are consistent with a pseudonormal left   ventricular filling pattern, with concomitant abnormal relaxation   and increased filling pressure (grade 2 diastolic dysfunction).   Doppler parameters are consistent with high ventricular filling   pressure. - Aortic valve: A bioprosthesis was present. There was mild   regurgitation. - Mitral valve: Severely calcified annulus. There was moderate   regurgitation. - Left atrium: The atrium was severely dilated. - Right ventricle: The cavity size was mildly dilated. - Right atrium: The atrium was moderately dilated. -  Pulmonary arteries: Systolic pressure was severely increased. PA   peak pressure: 72 mm Hg (S).  Impressions: - Low normal LV systolic function; grade 2 diastolic dysfunction   with elevated LV filling pressure; biatrial enlargement; s/p AVR   with normal gradients and mild AI; severe MAC with moderate,   eccentric Cameron (may be underestimated; suggest TEE to further   assess if clinically indicated); mild RVE; mild TR with severely   elevated pulmonary pressure.   Left Heart Cath 02/15/16:  Mid LAD lesion, 100% stenosed.  Ost 2nd Diag lesion, 99% stenosed.  Ost LAD lesion, 40% stenosed.  Mid Cx lesion, 65% stenosed. The lesion was previously treated with a drug-eluting stent between one and five months ago. May 2016  Sequential SVG was injected is large. Very large (greater than 6 mm)  This is a Y graft that goes to D2 and LAD.  Sequential SVG was injected is large. Very large  Y graft to both D2 and LAD  Mid Graft  lesion, 50% stenosed.  Prox Graft to Mid Graft lesion, 10% stenosed. The lesion was previously treated with a bare metal stent between one and five months ago. May 2016  Origin lesion, 40% stenosed.  Prox RCA lesion, 60% stenosed.  Dist Graft to Insertion lesion, 100% stenosed. Post intervention, there is a 0% residual stenosis.   1. Severe one-vessel coronary artery disease with acute occlusion of SVG to LAD at the anastomosis. Patent left circumflex stent with moderate restenosis. Moderate RCA disease. Patent Y graft to diagonal part with moderate ostial stenosis.  2. Successful angioplasty and drug-eluting stent placement to the anastomosis of SVG to LAD extending into the native vessel.   Recommendations: This was a very difficult procedure due to excessive tortuosity of the right artery in spite of using a long sheath. There was difficulty engaging the coronary arteries and advancing equipment. The patient developed moderate size hematoma which improved with manual compression. Recommend dual antiplatelet therapy for at least one year and ideally indefinitely given multiple stents. Warfarin can be resumed tomorrow if no significant groin hematoma. The patient can ultimately be treated with Plavix and warfarin without aspirin after at least 1-2 weeks of triple therapy. Obtain an echo. The aortic valve was not crossed.    Left Heart Cath 05/26/15:  1. Mid LAD lesion, 100% stenosed. Ost 2nd Diag lesion, 99% stenosed. 2. Prox RCA lesion, 50% stenosed - as described by [facility] Cath Report. 3. Y graft SVG-LAD-D2 is a Very Large graft with Ostial ~40% stenosis, widely patent stent into the LAD segment with jailed Y segment to D2 that has ~50% ostial stenosis large. 4. Mid Cx lesion, 65% stenosed -- In-stent stenosis of Resolute DES stent was placed in May 2016 -- FFR 0.87, Not Physiologically Significant. 5. Unable to cross prosthetic valve.   With the nonischemic FFR of the circumflex  lesion, the does not appear to be a flow-limiting lesion that could explain the patient's high risk stress test. This is the distribution that is stress test showed positivity during his non-STEMI in May. It is possible that this is simply infarct with peri-infarct ischemia in the setting of A. fib.  There is moderate disease in the RCA that was described by the [facility] report. The stent in the graft is widely patent with non-flow-limiting jailed ostial stenosis of the segment to the D2.  Recommendations:  Standard post cath care.  Would consider rhythm control as the patient has significant issues with recurrent PAF.  Would  order 2-D echocardiogram to get a new assessment of the patient's valve and EF to compare with Myoview  Continue risk factor modification.  Restart warfarin with IV heparin bridging 8 hours post sheath removal.   Myoview 05/23/15:  There was no ST segment deviation noted during stress.  Findings consistent with ischemia and prior myocardial infarction.  This is a high risk study.  The left ventricular ejection fraction is severely decreased (<30%).   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  Apical ischemia; EF 22%   Assessment & Plan    1. Elevated troponin / CAD / s/p CABG and PCI - troponin 0.71 --> 0.86 - EKG with 1st degree heart block and PVCs - troponin likely secondary to demand ischemia in the presence of diastolic heart failure exacerbation and influenza / COPD exacerbation - Pt has an extensive CAD history including s/p CABG and PCI interventions. However, pt denies anginal-type chest pain at this time. EKG without signs of ischemia. Troponin elevation is not likely secondary to obstructive disease. On EPIC review, he has not had a negative troponin since 2013. Do not recommend ischemic evaluation at this time.    2. Elevated BNP / acute on chronic diastolic heart failure - BNP 1969  - will hold diuretic at this time and defer to HD - pt  does not appear severely volume up - he states his dry weight is 80 kg; he is 81.3 on admission   3. Influenza A positive / COPD exacerbation - tamiflu started, home O2 applied - respiratory support per primary team   4. Hx of paroxysmal atrial fibrillation - TEE/DCCV on 11/11/16 converted to SR with 1st degree heart block - telemetry: Sinus rhythm - This patients CHA2DS2-VASc Score and unadjusted Ischemic Stroke Rate (% per year) is equal to 3.2 % stroke rate/year from a score of 3 (CHF, HTN, PVD) - continue coumadin, INR 2.14 (2.40) - continue home meds: amiodarone and lopressor  - continue midodrine for marginal pressures (home med)   5. ESRD on HD - per nephrology - pressures have been marginal - may be contributing to elevated BNP   6. Hypotension - has been hypotensive, especially after HD.  - currently on midodrine    Signed, Ledora Bottcher, PA-C 11/16/2016, 10:21 AM  I have seen and examined the patient along with Ledora Bottcher, PA-C.  I have reviewed the chart, notes and new data.  I agree with PA/NP's note.  Key new complaints: very complex medical history, including extensive CAD, previous AVR and moderate (-to-severe ) Cameron, severe PAH, recent DCCV for persistent AFib, severe COPD, ESRD/anuric, OSA admitted with influenza A. No angina. Key examination changes: quiet lungs bilaterally with diminished breath sounds, RRR with occasional ectopy, holosystolic apical murmur, Aortic ejection and regurgitation murmurs, no S3 Key new findings / data: ECG suggests extensive old anteroseptal MI, but echo shows normal LVEF (older echo does show a wall motion abnormality in LAD artery distribution). No acute ECG changes. Mild elevation in Troponin has been present for years and current troponin pattern is "plateau" rather than "rise and fall".  PLAN: No plans for additional ischemic workup during this admission. Troponin elevation is nonspecific, although it is a poor  prognostic sign. Fortunately, maintaining NSR despite acute influenza, severely dilated LA, at least moderate Cameron, severe COPD, sleep apnea.  Variability in Cameron assessment may be related to lading conditions/dialysis. Will probably not be able to make his Thursday AFib clinic appt. INR therapeutic.  Shakeera Rightmyer  Ezell Melikian, MD, [facility] [phone] 11/16/2016, 12:59 PM

## 2016-11-16 NOTE — Progress Notes (Signed)
PHARMACY NOTE:  ANTIMICROBIAL RENAL DOSAGE ADJUSTMENT  Current antimicrobial regimen includes a mismatch between antimicrobial dosage and estimated renal function.  As per policy approved by the Pharmacy & Therapeutics and Medical Executive Committees, the antimicrobial dosage will be adjusted accordingly.  Current antimicrobial dosage:  tamiflu '30mg'$  PO daily  Indication: Flu A +  Renal Function: ESRD on HD MWF    Antimicrobial dosage has been changed to:  Tamiflu '30mg'$  PO x1; then '30mg'$  PO qHD MWF x 5 days    Elicia Lamp, PharmD, BCPS Clinical Pharmacist 11/16/2016 10:59 AM

## 2016-11-16 NOTE — Progress Notes (Signed)
Initial Nutrition Assessment  DOCUMENTATION CODES:   Not applicable  INTERVENTION:  Continue renal diet with 1200 mL fluid restriction.  NUTRITION DIAGNOSIS:   Increased nutrient needs related to chronic illness as evidenced by estimated needs.   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 Patient states that he's had some cold-like symptoms with cough productive of clear sputum and rhinorrhea for about the last week. Today pt developed increased work of breathing, worse with exertion, requiring an increase in his home oxygen from 2 L to 3 L. Also reports increased wheezing. States that he used his home inhalers without improvement. Denies fevers, chest pain, abdominal pain, nausea, vomiting, diaphoresis, or diarrhea. He went to dialysis 3/12, but they were unable to complete the treatment secondary to shortness of breath and low blood pressure.  Patient reported appetite had been good PTA and continues to be good during admission. Patient reported he consumed 3 meals/day at home. Wife was in room during consult and very involved in patient's meal preparation.  Patient reported no known weight loss and per patient chart, weight has been stable. Patient reported he noticed indigestion this morning after breakfast. He ate scrambled eggs, bagel, french toast, peaches and drank coffee. Patient reported indigestion occurs randomly and is not triggered by specific foods.   Patient reported carefully monitoring his fluid  intake at home. Per patient, he sucks on ice chips to get his mouth wet, but spits the ice back out. Patients wife reported she ensures patient consumes high protein foods such as chicken, fish, and beans.   Patient reported he takes Vitamin B, and folic acid at home.  Nutrition focused physical exam completed.  No muscle or subcutaneous fat depletion noticed.  Meds Reviewed: Lipitor, Phoslo, Plavix, Colace, Solumedrol, Coumadin, Kayexalate  Labs Reviewed: BUN 34 (H), Creatinine 6.55 (H), Calcium 7.1 (L), Phosphorus 6.3 (H), Albumin 3.3 (L)  Diet Order:  Diet renal with fluid restriction Fluid restriction: 1200 mL Fluid; Room service appropriate? Yes; Fluid consistency: Thin  Skin:  Reviewed, no issues  Last BM:  3/12  Height:   Ht Readings from Last 1 Encounters:  11/15/16 '5\' 10"'$  (1.778 m)    Weight:   Wt Readings from Last 1 Encounters:  11/15/16 179 lb 3.7 oz (81.3 kg)    Ideal Body Weight:  75.45 kg  BMI:  Body mass index is 25.72 kg/m.  Estimated Nutritional Needs:   Kcal:  1900-2200 kcal  Protein:  97-107 grams  Fluid:  1200 ml  EDUCATION NEEDS:   No education needs identified at this time  Juliann Pulse M.S. Nutrition Dietetic Intern

## 2016-11-16 NOTE — Progress Notes (Signed)
[place] for Coumadin Indication: h/o PAF   Allergies  Allergen Reactions  . Diphenhydramine Itching    Only with IV doses. Tolerates oral.  . Penicillins Other (See Comments)    migraine  . Adhesive [Tape] Rash    Please use paper tape  . Amoxicillin Rash    migraine  . Ativan [Lorazepam] Anxiety  . Other Rash    Please use paper tape    Patient Measurements: Height: '5\' 10"'$  (177.8 cm) Weight: 179 lb 3.7 oz (81.3 kg) IBW/kg (Calculated) : 73 Heparin Dosing Weight: 81.3 kg  Vital Signs: Temp: 98.6 F (37 C) (03/13 0552) Temp Source: Oral (03/13 0552) BP: 103/81 (03/13 0552) Pulse Rate: 101 (03/13 0552)  Labs:  Recent Labs  11/15/16 1550 11/15/16 2215 11/16/16 0444  HGB 14.4  --  12.8*  HCT 44.7  --  40.7  PLT 99*  --  101*  LABPROT  --  26.6* 24.3*  INR  --  2.40 2.14  CREATININE 5.55*  --  6.55*  TROPONINI  --  0.71* 0.86*    Estimated Creatinine Clearance: 12.2 mL/min (by C-G formula based on SCr of 6.55 mg/dL (H)).   Medical History: Past Medical History:  Diagnosis Date  . Acute blood loss anemia    a. 02/2016 due to groin hematoma. Rec 3 U PRBC.  Marland Kitchen Anemia   . Aortic heart valve prolapse 04/2013   a. s/p bioprosthetic AVR at time of CABG.  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 Bifascicular block   . CAD (coronary artery disease) 04/2013   a. 04/2013: s/p CABG x 2 (Y SVG -LAD & D2). b. 01/2015: NSTEMI s/p DES to LCx, BMS to SVG-LAD. c. NSTEMI 05/2015 during AF/AFL - non-flow limiting FFR. d. Low risk nuc 3/17. e. STEMI 6/17 after coming off Plavix, s/p DES to SVG-LAD into native vessel.  . Carotid artery disease ([place])    a. 1-39% stenosis bilaterally in 06/2015.   Marland Kitchen Chronic respiratory failure ([place])   . 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 b. He had a renal Tx from 2008 to 2011, but subsequent rejection - Gets HD in [place], [place] on MWF schedule.    .  Hematoma    a. Large right groin hematoma after cath 02/2016 with associated ABL anemia.  . Hypertension   . Multiple sclerosis ([place])   . Nocturnal hypoxemia   . On home oxygen therapy    pt states he has not been wearing it  . PAF (paroxysmal atrial fibrillation) ([place])    a. h/o, placed on amiodarone 07/2016 due to recurrence.  . Paroxysmal atrial flutter ([place])    a. During 05/2015 admission - SVT, atrial flutter, and PAF.  Marland Kitchen Peripheral vascular disease ([place])    Cath in 01/2015 required 45 cm Dest[place]  . Renal transplant failure and rejection   . 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 intolerant of bipap.  Marland Kitchen SVT (supraventricular tachycardia) ([place])   . Valvular heart disease    a. 2D Echo 03/25/16: mild LVH, EF 50-55%, grade 2 Dd, AVR present with mild AI, mod MR, severely dilated LA, mildly dilated RV, mod RAE, PASP 72.    Medications:  Prescriptions Prior to Admission  Medication Sig Dispense Refill Last Dose  . acetaminophen (TYLENOL) 500 MG  tablet Take 1,000 mg by mouth every 6 (six) hours as needed for pain or fever.   Past Week at Unknown time  . albuterol (PROAIR HFA) 108 (90 BASE) MCG/ACT inhaler Inhale 1-2 puffs into the lungs every 6 (six) hours as needed for shortness of breath.    Past Month at Unknown time  . amiodarone (PACERONE) 200 MG tablet Take 1 tablet (200 mg total) by mouth daily. 90 tablet 3 11/15/2016 at Unknown time  . atorvastatin (LIPITOR) 40 MG tablet Take 20 mg by mouth daily at 6 PM.    11/14/2016 at Unknown time  . calcium acetate (PHOSLO) 667 MG capsule Take 4,002 mg by mouth 3 (three) times daily with meals.    11/15/2016 at Unknown time  . citalopram (CELEXA) 20 MG tablet Take 20 mg by mouth at bedtime.    11/14/2016 at Unknown time  . clopidogrel (PLAVIX) 75 MG tablet Take 1 tablet (75 mg total) by mouth daily. 90 tablet 3 11/15/2016 at Unknown time  . docusate sodium (COLACE) 100 MG capsule Take 400 mg by mouth  2 (two) times daily.    11/15/2016 at Unknown time  . folic acid (FOLVITE) 1 MG tablet Take 1 mg by mouth daily.    11/15/2016 at Unknown time  . gabapentin (NEURONTIN) 300 MG capsule Take 300 mg by mouth at bedtime.    11/14/2016 at Unknown time  . midodrine (PROAMATINE) 10 MG tablet Take 1 tablet (10 mg total) by mouth every Monday, Wednesday, and Friday with hemodialysis.   11/15/2016 at Unknown time  . multivitamin (RENA-VIT) TABS tablet Take 1 tablet by mouth daily. 30 tablet 1 11/15/2016 at Unknown time  . OXYGEN Inhale 2 L into the lungs at bedtime. 2lpm as needed    11/14/2016 at Unknown time  . ranitidine (ZANTAC) 150 MG tablet Take 150 mg by mouth every morning.    11/15/2016 at Unknown time  . sodium polystyrene (KAYEXALATE) 15 GM/60ML suspension Take 15 g by mouth See admin instructions. Only take 15 g on Sun / Tues / Thurs / Sat (non dialysis days)   11/14/2016 at Unknown time  . UNABLE TO FIND Take 1 application by mouth at bedtime. Med Name: BIPAP    11/14/2016 at Unknown time  . warfarin (COUMADIN) 7.5 MG tablet Take 1/2-1 tablet by mouth daily as directed by coumadin clinic (Patient taking differently: Take 1/2 tab by mouth daily in evenings) 30 tablet 1 11/14/2016 at Unknown time  . nitroGLYCERIN (NITROSTAT) 0.4 MG SL tablet Place 0.4 mg under the tongue every 5 (five) minutes as needed for chest pain (Take one tabe every 5-10 minutes for chest pain. Once you have taken three tabs you need to notify your MD or go to ER if chest pain not gone.).   prn   Scheduled:  . amiodarone  200 mg Oral Daily  . atorvastatin  20 mg Oral q1800  . calcium acetate  4,002 mg Oral TID WC  . citalopram  20 mg Oral QHS  . clopidogrel  75 mg Oral Daily  . docusate sodium  400 mg Oral BID  . famotidine  10 mg Oral Daily  . gabapentin  300 mg Oral QHS  . guaiFENesin  600 mg Oral BID  . ipratropium-albuterol  3 mL Nebulization QID  . methylPREDNISolone (SOLU-MEDROL) injection  40 mg Intravenous Q12H  .  metoprolol tartrate  12.5 mg Oral BID  . [START ON 11/17/2016] midodrine  10 mg Oral Q M,W,F-HD  .  oseltamivir  30 mg Oral Daily  . sodium chloride flush  3 mL Intravenous Q12H  . sodium chloride flush  3 mL Intravenous Q12H  . sodium polystyrene  15 g Oral Q T,Th,S,Su-1800  . warfarin  3.75 mg Oral q1800  . Warfarin - Pharmacist Dosing Inpatient   Does not apply q1800    Assessment: 61 y.o with h/o bioprosthetic AVR, PAF on coumadin PTA for PAF. Pharmacy consulted to dose inpatient. INR 2.4 on admit, now 2.14 this AM. Hg down 12.8, plt stable 101. No bleed documented.  PTA Coumadin dose: 3.75 mg po daily, LD pta on 3/11  Goal of Therapy:  INR 2-3 (confirmed with outpt anticoag office notes) Monitor platelets by anticoagulation protocol: Yes   Plan:  Resume home coumadin regimen - 3.'75mg'$  PO daily Daily INR Monitor CBC, s/sx bleeding  Elicia Lamp, PharmD, BCPS Clinical Pharmacist 11/16/2016 10:40 AM

## 2016-11-16 NOTE — Progress Notes (Signed)
PROGRESS NOTE    [name]  XTK:[id number] DOB: 1956/06/19 DOA: 11/15/2016 PCP: Teressa Lower, MD  Brief Narrative:[name] is a 61 y.o. male with medical history significant of CAD, bioprosthetic aortic valve replacement paroxysmal atrial fibrillation on Coumadin, end-stage renal disease on hemodialysis M,W and F. OSA, SVT, chronic respiratory failure on home oxygen, COPD. Presented with worsening shortness of breath while in dialysis today patient have had episode of hypotension and his dialysis in the dialysis had to be cut short, associated with productive cough, congestion.  Assessment & Plan:  1. Influenza/COPD exacerbation -add tamiflu, solumedrol, nebs -on home O2 at baseline  2. Elevated troponin/CAD/CABG/PCI -suspect due to demand from Above, 0.18-0.71-0.86 -Cards consulted by Dr.Doutova, await input -extensive cardiac history: s/p CABG in Aug 2014 , NSTEMI 01/2015  s/p DES to LCx and BMS to SVG-LAD, NSTEMI 05/2015 , recent anterior STEMI 02/2016 s/p PTCA/DES to SVG-LAD extending in native vessel -restart Metoprolol  3. ESRD on hemodialysis  -could not complete HD yesterday, some pulm edema on CXR -Renal following, suspect will need more volume removed tomorrow  4.  Essential (primary) hypertension  -was hypotensive initially, BP better, resume BB -also on midodrine with HD  5. Paroxysmal atrial flutter ([facility])   -CHA2DS2 vas score 4: continue current anticoagulation with Coumadin per pharmacy,   -resume low dose metoprolol, continue amiodarone  6. OSA -not compliant with C-pap  7. S/p Bioprosthetic AVR  DVT prophylaxis:  coumadin  Code Status:  FULL CODE  as per patient   Family Communication:   wife   at  Bedside Disposition Plan:    Home pending improvement  Consultants:  Renal  Cards    Subjective: Feels better, breathing improving not at baseline yet, some cough,no chest pain  Objective: Vitals:   11/15/16 2109 11/16/16 0552 11/16/16 0621  11/16/16 0919  BP: (!) 88/58 103/81    Pulse: 73 (!) 101    Resp: 18     Temp: 98.8 F (37.1 C) 98.6 F (37 C)    TempSrc: Oral Oral    SpO2: 93% 90% 94% 93%  Weight: 81.3 kg (179 lb 3.7 oz)     Height: '5\' 10"'$  (1.778 m)      No intake or output data in the 24 hours ending 11/16/16 1010 Filed Weights   11/15/16 2109  Weight: 81.3 kg (179 lb 3.7 oz)    Examination:  General exam: AAOx3 Respiratory system: poor air movement Cardiovascular system: S1 & S2 heard, RRR. No JVD, murmurs, faint systolic murmur Gastrointestinal system: Abdomen is nondistended, soft and nontender. Normal bowel sounds heard. Central nervous system: Alert and oriented. No focal neurological deficits. Extremities: Symmetric 5 x 5 power. Skin: darkening of skin over legs, 1plus edema Psychiatry: Judgement and insight appear normal. Mood & affect appropriate.     Data Reviewed: I have personally reviewed following labs and imaging studies  CBC:  Recent Labs Lab 11/11/16 0929 11/15/16 1550 11/16/16 0444  WBC 6.2 7.1 7.0  NEUTROABS  --  5.8  --   HGB 13.8 14.4 12.8*  HCT 42.9 44.7 40.7  MCV 110.9* 109.0* 109.4*  PLT 126* 99* 992*   Basic Metabolic Panel:  Recent Labs Lab 11/11/16 0929 11/15/16 1550 11/16/16 0444  NA 140 140 139  K 4.6 4.8 4.4  CL 98* 96* 97*  CO2 '29 31 28  '$ GLUCOSE 84 96 93  BUN 31* 25* 34*  CREATININE 6.28* 5.55* 6.55*  CALCIUM 7.5* 7.7* 7.1*  MG  --   --  2.3  PHOS  --   --  6.3*   GFR: Estimated Creatinine Clearance: 12.2 mL/min (by C-G formula based on SCr of 6.55 mg/dL (H)). Liver Function Tests:  Recent Labs Lab 11/15/16 1550 11/16/16 0444  AST 29 26  ALT 27 23  ALKPHOS 136* 116  BILITOT 1.1 1.1  PROT 8.7* 8.0  ALBUMIN 3.6 3.3*   No results for input(s): LIPASE, AMYLASE in the last 168 hours. No results for input(s): AMMONIA in the last 168 hours. Coagulation Profile:  Recent Labs Lab 11/11/16 0900 11/15/16 2215 11/16/16 0444  INR 3.5 2.40  2.14   Cardiac Enzymes:  Recent Labs Lab 11/15/16 2215 11/16/16 0444  TROPONINI 0.71* 0.86*   BNP (last 3 results) No results for input(s): PROBNP in the last 8760 hours. HbA1C: No results for input(s): HGBA1C in the last 72 hours. CBG: No results for input(s): GLUCAP in the last 168 hours. Lipid Profile: No results for input(s): CHOL, HDL, LDLCALC, TRIG, CHOLHDL, LDLDIRECT in the last 72 hours. Thyroid Function Tests:  Recent Labs  11/16/16 0444  TSH 3.957   Anemia Panel: No results for input(s): VITAMINB12, FOLATE, FERRITIN, TIBC, IRON, RETICCTPCT in the last 72 hours. Urine analysis: No results found for: COLORURINE, APPEARANCEUR, LABSPEC, PHURINE, GLUCOSEU, HGBUR, BILIRUBINUR, KETONESUR, PROTEINUR, UROBILINOGEN, NITRITE, LEUKOCYTESUR Sepsis Labs: '@LABRCNTIP'$ (procalcitonin:4,lacticidven:4)  ) Recent Results (from the past 240 hour(s))  MRSA PCR Screening     Status: None   Collection Time: 11/16/16  8:17 AM  Result Value Ref Range Status   MRSA by PCR NEGATIVE NEGATIVE Final    Comment:        The GeneXpert MRSA Assay (FDA approved for NASAL specimens only), is one component of a comprehensive MRSA colonization surveillance program. It is not intended to diagnose MRSA infection nor to guide or monitor treatment for MRSA infections.          Radiology Studies: Dg Chest 2 View  Result Date: 11/15/2016 CLINICAL DATA:  Productive cough, shortness of breath for 3 days EXAM: CHEST  2 VIEW COMPARISON:  08/04/2016 FINDINGS: Bilateral diffuse interstitial thickening. Trace right pleural effusion. No pneumothorax. Stable cardiomegaly. Prior CABG. Prior aortic bowel replacement. No acute osseous abnormality. IMPRESSION: Cardiomegaly with mild pulmonary vascular congestion. Electronically Signed   By: Kathreen Devoid   On: 11/15/2016 16:50        Scheduled Meds: . amiodarone  200 mg Oral Daily  . atorvastatin  20 mg Oral q1800  . calcium acetate  4,002 mg Oral  TID WC  . citalopram  20 mg Oral QHS  . clopidogrel  75 mg Oral Daily  . docusate sodium  400 mg Oral BID  . doxycycline (VIBRAMYCIN) IV  100 mg Intravenous Q12H  . famotidine  10 mg Oral Daily  . gabapentin  300 mg Oral QHS  . guaiFENesin  600 mg Oral BID  . ipratropium-albuterol  3 mL Nebulization QID  . methylPREDNISolone (SOLU-MEDROL) injection  40 mg Intravenous Q12H  . [START ON 11/17/2016] midodrine  10 mg Oral Q M,W,F-HD  . oseltamivir  30 mg Oral Daily  . sodium chloride flush  3 mL Intravenous Q12H  . sodium chloride flush  3 mL Intravenous Q12H  . sodium polystyrene  15 g Oral Q T,Th,S,Su-1800  . warfarin  3.75 mg Oral q1800  . Warfarin - Pharmacist Dosing Inpatient   Does not apply q1800   Continuous Infusions:   LOS: 1 day    Time spent: 59mn    Kacin Dancy,  MD [facility] Pager [phone]  If 7PM-7AM, please contact night-coverage [email or web address] Password [email or web address] 11/16/2016, 10:10 AM

## 2016-11-17 DIAGNOSIS — J9601 Acute respiratory failure with hypoxia: Secondary | ICD-10-CM

## 2016-11-17 LAB — CBC
HEMATOCRIT: 38.9 % — AB (ref 39.0–52.0)
Hemoglobin: 12.4 g/dL — ABNORMAL LOW (ref 13.0–17.0)
MCH: 34.7 pg — AB (ref 26.0–34.0)
MCHC: 31.9 g/dL (ref 30.0–36.0)
MCV: 109 fL — ABNORMAL HIGH (ref 78.0–100.0)
Platelets: 96 10*3/uL — ABNORMAL LOW (ref 150–400)
RBC: 3.57 MIL/uL — AB (ref 4.22–5.81)
RDW: 18.1 % — AB (ref 11.5–15.5)
WBC: 7.9 10*3/uL (ref 4.0–10.5)

## 2016-11-17 LAB — BASIC METABOLIC PANEL
Anion gap: 13 (ref 5–15)
BUN: 55 mg/dL — AB (ref 6–20)
CALCIUM: 7.5 mg/dL — AB (ref 8.9–10.3)
CO2: 30 mmol/L (ref 22–32)
CREATININE: 8.42 mg/dL — AB (ref 0.61–1.24)
Chloride: 96 mmol/L — ABNORMAL LOW (ref 101–111)
GFR calc non Af Amer: 6 mL/min — ABNORMAL LOW (ref >60)
GFR, EST AFRICAN AMERICAN: 7 mL/min — AB (ref >60)
GLUCOSE: 119 mg/dL — AB (ref 65–99)
Potassium: 5.2 mmol/L — ABNORMAL HIGH (ref 3.5–5.1)
Sodium: 139 mmol/L (ref 135–145)

## 2016-11-17 LAB — PROTIME-INR
INR: 2.09
Prothrombin Time: 23.8 seconds — ABNORMAL HIGH (ref 11.4–15.2)

## 2016-11-17 MED ORDER — LIDOCAINE-PRILOCAINE 2.5-2.5 % EX CREA: 1.0000 "application " | TOPICAL_CREAM | CUTANEOUS | Status: DC | PRN

## 2016-11-17 MED ORDER — ALTEPLASE 2 MG IJ SOLR: 2.0000 mg | Freq: Once | INTRAMUSCULAR | Status: DC | PRN

## 2016-11-17 MED ORDER — SODIUM CHLORIDE 0.9 % IV SOLN: 100.0000 mL | INTRAVENOUS | Status: DC | PRN

## 2016-11-17 MED ORDER — HEPARIN SODIUM (PORCINE) 1000 UNIT/ML DIALYSIS: 1000.0000 [IU] | INTRAMUSCULAR | Status: DC | PRN

## 2016-11-17 MED ORDER — PENTAFLUOROPROP-TETRAFLUOROETH EX AERO: 1.0000 "application " | INHALATION_SPRAY | CUTANEOUS | Status: DC | PRN

## 2016-11-17 MED ORDER — HEPARIN SODIUM (PORCINE) 1000 UNIT/ML DIALYSIS: 20.0000 [IU]/kg | INTRAMUSCULAR | Status: DC | PRN

## 2016-11-17 MED ORDER — LIDOCAINE HCL (PF) 1 % IJ SOLN: 5.0000 mL | INTRAMUSCULAR | Status: DC | PRN

## 2016-11-17 NOTE — Progress Notes (Signed)
OT Cancellation Note  Patient Details Name: [name] MRN: [id number] DOB: 01/05/1956   Cancelled Treatment:    Reason Eval/Treat Not Completed: Patient at procedure or test/ unavailable (currently in HD). Will follow up as time allows.  Binnie Kand M.S., OTR/L Pager: [phone]  11/17/2016, 3:45 PM

## 2016-11-17 NOTE — Progress Notes (Signed)
PROGRESS NOTE    [name]  SNK:[id number] DOB: 01/27/1956 DOA: 11/15/2016 PCP: Teressa Lower, MD  Brief Narrative:[name] is a 60 y.o. male with medical history significant of CAD, bioprosthetic aortic valve replacement paroxysmal atrial fibrillation on Coumadin, end-stage renal disease on hemodialysis M,W and F. OSA, SVT, chronic respiratory failure on home oxygen, COPD. Presented with worsening shortness of breath while in dialysis today patient have had episode of hypotension and his dialysis in the dialysis had to be cut short, associated with productive cough, congestion.  Assessment & Plan:  1. Influenza/COPD exacerbation -continue tamiflu, solumedrol, nebs -on home O2 at baseline  2. Elevated troponin/CAD/CABG/PCI -suspect due to demand from Above, 0.18-0.71-0.86 -Cards consulted, no further work up at present,  -extensive cardiac history: s/p CABG in Aug 2014 , NSTEMI 01/2015  s/p DES to LCx and BMS to SVG-LAD, NSTEMI 05/2015 , recent anterior STEMI 02/2016 s/p PTCA/DES to SVG-LAD extending in native vessel -restart Metoprolol  3. ESRD on hemodialysis  -Renal following, HD per them  4.  Essential (primary) hypertension  -was hypotensive initially, BP better, resume BB -also on midodrine with HD  5. Paroxysmal atrial flutter (HCC)   -CHA2DS2 vas score 4: continue current anticoagulation with Coumadin per pharmacy,   -resume low dose metoprolol, continue amiodarone  6. OSA -not compliant with C-pap  7. S/p Bioprosthetic AVR  DVT prophylaxis:  coumadin  Code Status:  FULL CODE  as per patient   Family Communication:   wife   at  Bedside Disposition Plan:    Home pending improvement  Consultants:  Renal  Cards  Subjective: Continues to have shortnes of breath, still hypoxic.   Objective: Vitals:   11/17/16 1700 11/17/16 1730 11/17/16 1800 11/17/16 1815  BP: 113/73 114/67 110/68 109/75  Pulse: 70 67 68 67  Resp: '18 17 18 18  '$ Temp:    97.9 F (36.6 C)   TempSrc:    Oral  SpO2: 96% 97% 98% 97%  Weight:    81.5 kg (179 lb 10.8 oz)  Height:        Intake/Output Summary (Last 24 hours) at 11/17/16 1919 Last data filed at 11/17/16 1815  Gross per 24 hour  Intake              480 ml  Output             3000 ml  Net            -2520 ml   Filed Weights   11/15/16 2109 11/17/16 1400 11/17/16 1815  Weight: 81.3 kg (179 lb 3.7 oz) 84.5 kg (186 lb 4.6 oz) 81.5 kg (179 lb 10.8 oz)    Examination:  General exam: AAOx3 Respiratory system: poor air movement, bilateral wheeze Cardiovascular system: S1 & S2 heard, RRR. No JVD, murmurs, faint systolic murmur Gastrointestinal system: Abdomen is nondistended, soft and nontender. Normal bowel sounds heard. Central nervous system: Alert and oriented. No focal neurological deficits. Extremities: Symmetric 5 x 5 power. Skin: darkening of skin over legs, 1plus edema   Data Reviewed: I have personally reviewed following labs and imaging studies  CBC:  Recent Labs Lab 11/11/16 0929 11/15/16 1550 11/16/16 0444 11/17/16 0134  WBC 6.2 7.1 7.0 7.9  NEUTROABS  --  5.8  --   --   HGB 13.8 14.4 12.8* 12.4*  HCT 42.9 44.7 40.7 38.9*  MCV 110.9* 109.0* 109.4* 109.0*  PLT 126* 99* 101* 96*   Basic Metabolic Panel:  Recent Labs Lab 11/11/16 0929  11/15/16 1550 11/16/16 0444 11/17/16 0134  NA 140 140 139 139  K 4.6 4.8 4.4 5.2*  CL 98* 96* 97* 96*  CO2 '29 31 28 30  '$ GLUCOSE 84 96 93 119*  BUN 31* 25* 34* 55*  CREATININE 6.28* 5.55* 6.55* 8.42*  CALCIUM 7.5* 7.7* 7.1* 7.5*  MG  --   --  2.3  --   PHOS  --   --  6.3*  --    GFR: Estimated Creatinine Clearance: 9.5 mL/min (by C-G formula based on SCr of 8.42 mg/dL (H)). Liver Function Tests:  Recent Labs Lab 11/15/16 1550 11/16/16 0444  AST 29 26  ALT 27 23  ALKPHOS 136* 116  BILITOT 1.1 1.1  PROT 8.7* 8.0  ALBUMIN 3.6 3.3*   No results for input(s): LIPASE, AMYLASE in the last 168 hours. No results for input(s): AMMONIA in the  last 168 hours. Coagulation Profile:  Recent Labs Lab 11/11/16 0900 11/15/16 2215 11/16/16 0444 11/17/16 0134  INR 3.5 2.40 2.14 2.09   Cardiac Enzymes:  Recent Labs Lab 11/15/16 2215 11/16/16 0444 11/16/16 1003  TROPONINI 0.71* 0.86* 0.63*   BNP (last 3 results) No results for input(s): PROBNP in the last 8760 hours. HbA1C: No results for input(s): HGBA1C in the last 72 hours. CBG: No results for input(s): GLUCAP in the last 168 hours. Lipid Profile: No results for input(s): CHOL, HDL, LDLCALC, TRIG, CHOLHDL, LDLDIRECT in the last 72 hours. Thyroid Function Tests:  Recent Labs  11/16/16 0444  TSH 3.957   Anemia Panel: No results for input(s): VITAMINB12, FOLATE, FERRITIN, TIBC, IRON, RETICCTPCT in the last 72 hours. Urine analysis: No results found for: COLORURINE, APPEARANCEUR, LABSPEC, PHURINE, GLUCOSEU, HGBUR, BILIRUBINUR, KETONESUR, PROTEINUR, UROBILINOGEN, NITRITE, LEUKOCYTESUR Sepsis Labs: '@LABRCNTIP'$ (procalcitonin:4,lacticidven:4)  ) Recent Results (from the past 240 hour(s))  Respiratory Panel by PCR     Status: Abnormal   Collection Time: 11/15/16 10:20 PM  Result Value Ref Range Status   Adenovirus NOT DETECTED NOT DETECTED Final   Coronavirus 229E NOT DETECTED NOT DETECTED Final   Coronavirus HKU1 NOT DETECTED NOT DETECTED Final   Coronavirus NL63 NOT DETECTED NOT DETECTED Final   Coronavirus OC43 NOT DETECTED NOT DETECTED Final   Metapneumovirus NOT DETECTED NOT DETECTED Final   Rhinovirus / Enterovirus NOT DETECTED NOT DETECTED Final   Influenza A H3 DETECTED (A) NOT DETECTED Final   Influenza B NOT DETECTED NOT DETECTED Final   Parainfluenza Virus 1 NOT DETECTED NOT DETECTED Final   Parainfluenza Virus 2 NOT DETECTED NOT DETECTED Final   Parainfluenza Virus 3 NOT DETECTED NOT DETECTED Final   Parainfluenza Virus 4 NOT DETECTED NOT DETECTED Final   Respiratory Syncytial Virus NOT DETECTED NOT DETECTED Final   Bordetella pertussis NOT DETECTED  NOT DETECTED Final   Chlamydophila pneumoniae NOT DETECTED NOT DETECTED Final   Mycoplasma pneumoniae NOT DETECTED NOT DETECTED Final  MRSA PCR Screening     Status: None   Collection Time: 11/16/16  8:17 AM  Result Value Ref Range Status   MRSA by PCR NEGATIVE NEGATIVE Final    Comment:        The GeneXpert MRSA Assay (FDA approved for NASAL specimens only), is one component of a comprehensive MRSA colonization surveillance program. It is not intended to diagnose MRSA infection nor to guide or monitor treatment for MRSA infections.          Radiology Studies: No results found.      Scheduled Meds: . amiodarone  200 mg Oral  Daily  . atorvastatin  20 mg Oral q1800  . calcium acetate  4,002 mg Oral TID WC  . citalopram  20 mg Oral QHS  . clopidogrel  75 mg Oral Daily  . docusate sodium  400 mg Oral BID  . famotidine  10 mg Oral Daily  . gabapentin  300 mg Oral QHS  . guaiFENesin  600 mg Oral BID  . ipratropium-albuterol  3 mL Nebulization QID  . methylPREDNISolone (SOLU-MEDROL) injection  40 mg Intravenous Q12H  . metoprolol tartrate  12.5 mg Oral BID  . midodrine  10 mg Oral Q M,W,F-HD  . multivitamin  1 tablet Oral QHS  . oseltamivir  30 mg Oral Q M,W,F-1800  . sodium chloride flush  3 mL Intravenous Q12H  . sodium chloride flush  3 mL Intravenous Q12H  . sodium polystyrene  15 g Oral Q T,Th,S,Su-1800  . warfarin  3.75 mg Oral q1800  . Warfarin - Pharmacist Dosing Inpatient   Does not apply q1800   Continuous Infusions:   LOS: 2 days    Time spent: 47mn   Author:  PBerle Mull MD [facility] Pager: [phone]/14/2018 7:21 PM     If 7PM-7AM, please contact night-coverage [email or web address] Password [facility]/14/2018, 7:19 PM

## 2016-11-17 NOTE — Discharge Instructions (Signed)

## 2016-11-17 NOTE — Progress Notes (Signed)
Progress Note  Patient Name: [name] Date of Encounter: 11/17/2016  Primary Cardiologist: Dr. Julianne Handler  Subjective   Patient is feeling well; denies chest pain, SOB, and palpitations. Pt receiving a breathing treatment; he reports feeling better and is able to breathe better.  Inpatient Medications    Scheduled Meds: . amiodarone  200 mg Oral Daily  . atorvastatin  20 mg Oral q1800  . calcium acetate  4,002 mg Oral TID WC  . citalopram  20 mg Oral QHS  . clopidogrel  75 mg Oral Daily  . docusate sodium  400 mg Oral BID  . famotidine  10 mg Oral Daily  . gabapentin  300 mg Oral QHS  . guaiFENesin  600 mg Oral BID  . ipratropium-albuterol  3 mL Nebulization QID  . methylPREDNISolone (SOLU-MEDROL) injection  40 mg Intravenous Q12H  . metoprolol tartrate  12.5 mg Oral BID  . midodrine  10 mg Oral Q M,W,F-HD  . multivitamin  1 tablet Oral QHS  . oseltamivir  30 mg Oral Q M,W,F-1800  . sodium chloride flush  3 mL Intravenous Q12H  . sodium chloride flush  3 mL Intravenous Q12H  . sodium polystyrene  15 g Oral Q T,Th,S,Su-1800  . warfarin  3.75 mg Oral q1800  . Warfarin - Pharmacist Dosing Inpatient   Does not apply q1800   Continuous Infusions:  PRN Meds: sodium chloride, acetaminophen **OR** acetaminophen, HYDROcodone-acetaminophen, levalbuterol, ondansetron **OR** ondansetron (ZOFRAN) IV, sodium chloride flush   Vital Signs    Vitals:   11/16/16 1615 11/16/16 2042 11/16/16 2111 11/17/16 0446  BP:  105/74  97/65  Pulse:  66 68 (!) 58  Resp:  _0 Temp:  98.3 F (36.8 C)  98 F (36.7 C)  TempSrc:  Oral  Oral  SpO2: 90% 94%  94%  Weight:      Height:        Intake/Output Summary (Last 24 hours) at 11/17/16 0753 Last data filed at 11/16/16 1700  Gross per 24 hour  Intake              240 ml  Output                0 ml  Net              240 ml   Filed Weights   11/15/16 2109  Weight: 179 lb 3.7 oz (81.3 kg)     Physical Exam   General:  Well developed, well nourished, male appearing in no acute distress. Head: Normocephalic, atraumatic.  Neck: Supple without bruits, JVD Lungs:  Pt receiving breathing treatment, coarse sounds throughout, respirations unlabored, on O2 Heart: RRR, S1, S2, no murmur; no rub. Abdomen: Soft, non-tender, non-distended with normoactive bowel sounds. No hepatomegaly. No rebound/guarding. No obvious abdominal masses. Extremities: No clubbing, cyanosis, No edema. Distal pedal pulses are 2+ bilaterally. Neuro: Alert and oriented X 3. Moves all extremities spontaneously. Psych: Normal affect.  Labs    Chemistry Recent Labs Lab 11/15/16 1550 11/16/16 0444 11/17/16 0134  NA 140 139 139  K 4.8 4.4 5.2*  CL 96* 97* 96*  CO2 _1 GLUCOSE 96 93 119*  BUN 25* 34* 55*  CREATININE 5.55* 6.55* 8.42*  CALCIUM 7.7* 7.1* 7.5*  PROT 8.7* 8.0  --   ALBUMIN 3.6 3.3*  --   AST 29 26  --   ALT 27 23  --   ALKPHOS 136* 116  --  BILITOT 1.1 1.1  --   GFRNONAA 10* 8* 6*  GFRAA 12* 9* 7*  ANIONGAP _0 Hematology Recent Labs Lab 11/15/16 1550 11/16/16 0444 11/17/16 0134  WBC 7.1 7.0 7.9  RBC 4.10* 3.72* 3.57*  HGB 14.4 12.8* 12.4*  HCT 44.7 40.7 38.9*  MCV 109.0* 109.4* 109.0*  MCH 35.1* 34.4* 34.7*  MCHC 32.2 31.4 31.9  RDW 18.7* 18.4* 18.1*  PLT 99* 101* 96*    Cardiac Enzymes Recent Labs Lab 11/15/16 2215 11/16/16 0444 11/16/16 1003  TROPONINI 0.71* 0.86* 0.63*    Recent Labs Lab 11/15/16 1623 11/15/16 1936  TROPIPOC 0.18* 0.27*     BNP Recent Labs Lab 11/15/16 1550  BNP 1,968.8*     DDimer No results for input(s): DDIMER in the last 168 hours.   Radiology    Dg Chest 2 View  Result Date: 11/15/2016 CLINICAL DATA:  Productive cough, shortness of breath for 3 days EXAM: CHEST  2 VIEW COMPARISON:  08/04/2016 FINDINGS: Bilateral diffuse interstitial thickening. Trace right pleural effusion. No pneumothorax. Stable cardiomegaly. Prior CABG. Prior aortic  bowel replacement. No acute osseous abnormality. IMPRESSION: Cardiomegaly with mild pulmonary vascular congestion. Electronically Signed   By: Kathreen Devoid   On: 11/15/2016 16:50     Telemetry    NSR in the 50s with PVCs - Personally Reviewed  ECG    No new tracings - Personally Reviewed   Cardiac Studies    Echocardiogram (TEE) 11/11/16 (pre-DCCV): Left ventricle: LVEF is grossly normal. Aortic valve: AV bioprosthesis is difficult to see No significant AI. Mitral valve: MV is thickened, calcified. There is restricted motion of the posterior leaflet. MR is directed posterior into LA and goest around back of left atrium. It is moderate to severe in intensity No backflow is seen in pulmonary veins. Left atrium: No evidence of thrombus in the atrial cavity or appendage. Atrial septum: Atrial septum bulges towards LA consistent with increased R sided pressures. Right ventricle: RV is dilated and RVEF is moderate to severely depressed. Pulmonic valve: PV is normal Trace PI. Tricuspid valve: TV is normal Mild TR. Right atrium: RA is enlarged   Echocardiogram 03/25/16: Study Conclusions - Left ventricle: The cavity size was normal. Wall thickness was increased in a pattern of mild LVH. Systolic function was normal. The estimated ejection fraction was in the range of 50% to 55%. Wall motion was normal; there were no regional wall motion abnormalities. Features are consistent with a pseudonormal left ventricular filling pattern, with concomitant abnormal relaxation and increased filling pressure (grade 2 diastolic dysfunction). Doppler parameters are consistent with high ventricular filling pressure. - Aortic valve: A bioprosthesis was present. There was mild regurgitation. - Mitral valve: Severely calcified annulus. There was moderate regurgitation. - Left atrium: The atrium was severely dilated. - Right ventricle: The cavity size was mildly  dilated. - Right atrium: The atrium was moderately dilated. - Pulmonary arteries: Systolic pressure was severely increased. PA peak pressure: 72 mm Hg (S).  Impressions: - Low normal LV systolic function; grade 2 diastolic dysfunction with elevated LV filling pressure; biatrial enlargement; s/p AVR with normal gradients and mild AI; severe MAC with moderate, eccentric MR (may be underestimated; suggest TEE to further assess if clinically indicated); mild RVE; mild TR with severely elevated pulmonary pressure.   Left Heart Cath 02/15/16:  Mid LAD lesion, 100% stenosed.  Ost 2nd Diag lesion, 99% stenosed.  Ost LAD lesion, 40% stenosed.  Mid Cx lesion, 65% stenosed. The  lesion was previously treated with a drug-eluting stent between one and five months ago. May 2016  Sequential SVG was injected is large. Very large (greater than 6 mm)  This is a Y graft that goes to D2 and LAD.  Sequential SVG was injected is large. Very large  Y graft to both D2 and LAD  Mid Graft lesion, 50% stenosed.  Prox Graft to Mid Graft lesion, 10% stenosed. The lesion was previously treated with a bare metal stent between one and five months ago. May 2016  Origin lesion, 40% stenosed.  Prox RCA lesion, 60% stenosed.  Dist Graft to Insertion lesion, 100% stenosed. Post intervention, there is a 0% residual stenosis.  1. Severe one-vessel coronary artery disease with acute occlusion of SVG to LAD at the anastomosis. Patent left circumflex stent with moderate restenosis. Moderate RCA disease. Patent Y graft to diagonal part with moderate ostial stenosis.  2. Successful angioplasty and drug-eluting stent placement to the anastomosis of SVG to LAD extending into the native vessel.   Recommendations: This was a very difficult procedure due to excessive tortuosity of the right artery in spite of using a long sheath. There was difficulty engaging the coronary arteries and advancing  equipment. The patient developed moderate size hematoma which improved with manual compression. Recommend dual antiplatelet therapy for at least one year and ideally indefinitely given multiple stents. Warfarin can be resumed tomorrow if no significant groin hematoma. The patient can ultimately be treated with Plavix and warfarin without aspirin after at least 1-2 weeks of triple therapy. Obtain an echo. The aortic valve was not crossed.    Left Heart Cath 05/26/15:  1. Mid LAD lesion, 100% stenosed. Ost 2nd Diag lesion, 99% stenosed. 2. Prox RCA lesion, 50% stenosed - as described by [facility] Cath Report. 3. Y graft SVG-LAD-D2 is a Very Large graft with Ostial ~40% stenosis, widely patent stent into the LAD segment with jailed Y segment to D2 that has ~50% ostial stenosis large. 4. Mid Cx lesion, 65% stenosed -- In-stent stenosis of Resolute DES stent was placed in May 2016 -- FFR 0.87, Not Physiologically Significant. 5. Unable to cross prosthetic valve.  With the nonischemic FFR of the circumflex lesion, the does not appear to be a flow-limiting lesion that could explain the patient's high risk stress test. This is the distribution that is stress test showed positivity during his non-STEMI in May. It is possible that this is simply infarct with peri-infarct ischemia in the setting of A. fib.  There is moderate disease in the RCA that was described by the [facility] report. The stent in the graft is widely patent with non-flow-limiting jailed ostial stenosis of the segment to the D2.  Recommendations:  Standard post cath care.  Would consider rhythm control as the patient has significant issues with recurrent PAF.  Would order 2-D echocardiogram to get a new assessment of the patient's valve and EF to compare with Myoview  Continue risk factor modification.  Restart warfarin with IV heparin bridging 8 hours post sheath removal.   Myoview 05/23/15:  There was no ST segment deviation  noted during stress.  Findings consistent with ischemia and prior myocardial infarction.  This is a high risk study.  The left ventricular ejection fraction is severely decreased (<30%).  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 He presented to [facility] following a bout of hypotension and shortness of breath during his regular dialysis treatment. Cardiology was consulted for an elevated troponin.  Assessment & Plan    1. Elevated troponin / CAD / s/p CABG and PCI - troponin 0.71 --> 0.86 --> 0.63 - third troponin trending down - EKG with 1st degree heart block and PVCs - troponin likely secondary to demand ischemia in the presence of diastolic heart failure exacerbation and influenza / COPD exacerbation - Pt has an extensive CAD history including s/p CABG and PCI interventions. However, pt denies anginal-type chest pain at this time. EKG without signs of ischemia. Troponin elevation is not likely secondary to obstructive disease. On EPIC review, he has not had a negative troponin since 2013. Do not recommend ischemic evaluation at this time.    2. Elevated BNP / acute on chronic diastolic heart failure - BNP 1969  - will hold diuretic at this time and defer  to HD - pt does not appear severely volume up - he states his dry weight is 80 kg; he is 81.3 on admission - pt needs daily weights   3. Influenza A positive / COPD exacerbation - tamiflu started, home O2 applied - respiratory support per primary team   4. Hx of paroxysmal atrial fibrillation - TEE/DCCV on 11/11/16 converted to SR with 1st degree heart block - telemetry: Sinus rhythm - This patients CHA2DS2-VASc Score and unadjusted Ischemic Stroke Rate (% per year) is equal to 3.2 % stroke rate/year from a score of 3 (CHF, HTN, PVD) - continue coumadin, INR 2.09 (2.14) - if INR falls below 2.0, may need to bridge with heparin - continue home meds: amiodarone and lopressor  - continue midodrine for marginal pressures (home med)   5. ESRD on HD - per nephrology - pressures have been marginal - may be contributing to elevated BNP   6. Hypotension - has been hypotensive, especially after HD.  - BP 90-100s/60-70s - currently on midodrine   7. Hyperkalemia - will likely resolve after dialysis today; recommend repeat BMP after HD  Signed, Ledora Bottcher , PA-C 7:53 AM 11/17/2016 Pager: [phone] I have seen and examined the patient along with Ledora Bottcher , PA-C.  I have reviewed the chart, notes and new data.  I agree with PA's note.  PLAN: Continue home CV meds, including warfarin for INR>2. No new recommendations. Please reconsult if needed.  Sanda Klein, MD, [place] [phone] 11/17/2016, 12:30 PM

## 2016-11-17 NOTE — Progress Notes (Signed)
Patient ID: [name], male   DOB: October 14, 1955, 61 y.o.   MRN: [id number]  [facility] Progress Note    Subjective:   Breathing better today   Objective:   BP 97/68 (BP Location: Right Arm)   Pulse 62   Temp 98.7 F (37.1 C) (Oral)   Resp 17   Ht '5\' 10"'$  (1.778 m)   Wt 81.3 kg (179 lb 3.7 oz)   SpO2 95%   BMI 25.72 kg/m   Intake/Output: I/O last 3 completed shifts: In: 240 [P.O.:240] Out: -    Intake/Output this shift:  Total I/O In: 480 [P.O.:480] Out: -  Weight change:   Physical Exam: Gen:WD WM in NAd CVS: no rub Resp:scattered rhonchi and exp wheezes bilaterally Abd: benign Ext:no edema, LAVF +T/B  Labs: BMET  Recent Labs Lab 11/11/16 0929 11/15/16 1550 11/16/16 0444 11/17/16 0134  NA 140 140 139 139  K 4.6 4.8 4.4 5.2*  CL 98* 96* 97* 96*  CO2 '29 31 28 30  '$ GLUCOSE 84 96 93 119*  BUN 31* 25* 34* 55*  CREATININE 6.28* 5.55* 6.55* 8.42*  ALBUMIN  --  3.6 3.3*  --   CALCIUM 7.5* 7.7* 7.1* 7.5*  PHOS  --   --  6.3*  --    CBC  Recent Labs Lab 11/11/16 0929 11/15/16 1550 11/16/16 0444 11/17/16 0134  WBC 6.2 7.1 7.0 7.9  NEUTROABS  --  5.8  --   --   HGB 13.8 14.4 12.8* 12.4*  HCT 42.9 44.7 40.7 38.9*  MCV 110.9* 109.0* 109.4* 109.0*  PLT 126* 99* 101* 96*    '@IMGRELPRIORS'$ @ Medications:    . amiodarone  200 mg Oral Daily  . atorvastatin  20 mg Oral q1800  . calcium acetate  4,002 mg Oral TID WC  . citalopram  20 mg Oral QHS  . clopidogrel  75 mg Oral Daily  . docusate sodium  400 mg Oral BID  . famotidine  10 mg Oral Daily  . gabapentin  300 mg Oral QHS  . guaiFENesin  600 mg Oral BID  . ipratropium-albuterol  3 mL Nebulization QID  . methylPREDNISolone (SOLU-MEDROL) injection  40 mg Intravenous Q12H  . metoprolol tartrate  12.5 mg Oral BID  . midodrine  10 mg Oral Q M,W,F-HD  . multivitamin  1 tablet Oral QHS  . oseltamivir  30 mg Oral Q M,W,F-1800  . sodium chloride flush  3 mL Intravenous Q12H  . sodium  chloride flush  3 mL Intravenous Q12H  . sodium polystyrene  15 g Oral Q T,Th,S,Su-1800  . warfarin  3.75 mg Oral q1800  . Warfarin - Pharmacist Dosing Inpatient   Does not apply q1800   Dialysis Orders:  [place] MWF 4h 160F BFR 450 2/2.25 Prof 2 EDW 80 kg LAVF Heparin 2000 U  -No VDRA -No ESA    Assessment/ Plan:   1. Influenza A- on tamiflu renal dosing 2. SOB/COPD exacerbation due to #1. Per primary.  Somewhat improved overnight. 3. ESRD continue with HD qMWF.  Not getting to edw at outpatient unit.  Low bp today, will set goal uf of 3 liters and follow. 4. Anemia: no ESA d/t Hgb >12 5. CKD-MBD: cont with binders, s/p Ptx, no VDA 6. Nutrition: renal diet/vitamins 7. Hypertension:stable 8. PAF- on coumadin, dosing per pharmacy.  Donetta Potts, MD [place] Pager [phone] 11/17/2016, 2:18 PM

## 2016-11-17 NOTE — Progress Notes (Signed)
ANTICOAGULATION CONSULT NOTE - Follow Up Consult  Pharmacy Consult for Coumadin Indication: atrial fibrillation  Patient Measurements: Height: '5\' 10"'$  (177.8 cm) Weight: 179 lb 3.7 oz (81.3 kg) IBW/kg (Calculated) : 73  Vital Signs: Temp: 98 F (36.7 C) (03/14 0446) Temp Source: Oral (03/14 0446) BP: 97/65 (03/14 0446) Pulse Rate: 58 (03/14 0446)  Labs:  Recent Labs  11/15/16 1550 11/15/16 2215 11/16/16 0444 11/16/16 1003 11/17/16 0134  HGB 14.4  --  12.8*  --  12.4*  HCT 44.7  --  40.7  --  38.9*  PLT 99*  --  101*  --  96*  LABPROT  --  26.6* 24.3*  --  23.8*  INR  --  2.40 2.14  --  2.09  CREATININE 5.55*  --  6.55*  --  8.42*  TROPONINI  --  0.71* 0.86* 0.63*  --    ESRD  Assessment: 61 y.o with h/o bioprosthetic AVR, PAF, on coumadin PTA for PAF. Pharmacy consulted to dose inpatient on admit 3/12 pm. INR 2.4 on admit, now 2.09.  3/12 dose given ~12am on 3/13; 6pm dose not charted.   Home regimen: 3.75 mg daily. Last outpatient INR 3.5 on 11/11/16. Skipped for 1 day then same regimen continued.  Goal of Therapy:  INR 2-3 Monitor platelets by anticoagulation protocol: Yes   Plan:   Continue Coumadin 3.75 mg daily.  Daily PT/INR while inpatient.  Arty Baumgartner, [place] Pager: [phone] 11/17/2016,11:48 AM

## 2016-11-18 ENCOUNTER — Inpatient Hospital Stay (HOSPITAL_COMMUNITY): Admission: RE | Admit: 2016-11-18 | Payer: Medicare Other | Source: Ambulatory Visit | Admitting: Nurse Practitioner

## 2016-11-18 LAB — PROTIME-INR
INR: 1.61
PROTHROMBIN TIME: 19.3 s — AB (ref 11.4–15.2)

## 2016-11-18 MED ORDER — OSELTAMIVIR PHOSPHATE 30 MG PO CAPS: 30.0000 mg | ORAL_CAPSULE | ORAL | 0 refills | Status: AC

## 2016-11-18 MED ORDER — PREDNISONE 20 MG PO TABS
50.0000 mg | ORAL_TABLET | Freq: Every day | ORAL | Status: DC
  Administered 2016-11-18: 50 mg via ORAL
  Filled 2016-11-18: qty 2

## 2016-11-18 MED ORDER — FLUTICASONE-SALMETEROL 250-50 MCG/DOSE IN AEPB: 1.0000 | INHALATION_SPRAY | Freq: Every day | RESPIRATORY_TRACT | 0 refills | Status: DC

## 2016-11-18 MED ORDER — IPRATROPIUM-ALBUTEROL 0.5-2.5 (3) MG/3ML IN SOLN: 3.0000 mL | Freq: Three times a day (TID) | RESPIRATORY_TRACT | Status: DC

## 2016-11-18 MED ORDER — GUAIFENESIN ER 600 MG PO TB12: 600.0000 mg | ORAL_TABLET | Freq: Two times a day (BID) | ORAL | 0 refills | Status: DC

## 2016-11-18 MED ORDER — IPRATROPIUM-ALBUTEROL 0.5-2.5 (3) MG/3ML IN SOLN: 3.0000 mL | RESPIRATORY_TRACT | 0 refills | Status: DC | PRN

## 2016-11-18 MED ORDER — WARFARIN SODIUM 7.5 MG PO TABS: 7.5000 mg | ORAL_TABLET | Freq: Once | ORAL | Status: DC

## 2016-11-18 MED ORDER — PREDNISONE 10 MG PO TABS: ORAL_TABLET | ORAL | 0 refills | Status: DC

## 2016-11-18 NOTE — Care Management Note (Signed)
Case Management Note Marvetta Gibbons RN, BSN Unit 2W-Case Manager [phone]  Patient Details  Name: Saban Heinlen MRN: [id number] Date of Birth: 1956-03-23  Subjective/Objective:  Pt admitted with flu and COPD                   Action/Plan: PTA pt lived at home with wife- has home 02 (baseline 2L) with [facility]- pt with new home 02 needs of 4L- orders have been placed- call made to [facility]- for updated home 02 needs- faxed needed paperwork along with new home 02 orders to [facility] via epic to [phone]- they will bring portable tank to room for transport home as wife did not bring tank to hospital for d/c.  Pt has PCP- Dr. Garlon Hatchet to f/u with and does not need HH. No further Cm needs noted.   Expected Discharge Date:  11/18/16               Expected Discharge Plan:  Home/Self Care  In-House Referral:     Discharge planning Services  CM Consult  Post Acute Care Choice:  Durable Medical Equipment Choice offered to:  Patient, Spouse  DME Arranged:  Oxygen DME Agency:  Other - Comment  HH Arranged:  NA HH Agency:  NA  Status of Service:  Completed, signed off  If discussed at [place] of Stay Meetings, dates discussed:    Additional Comments:  Dawayne Patricia, RN 11/18/2016, 10:38 AM

## 2016-11-18 NOTE — Progress Notes (Signed)
Discharge instructions given. Pt verbalized understanding and all questions were answered.  

## 2016-11-18 NOTE — Progress Notes (Signed)
ANTICOAGULATION CONSULT NOTE - Follow Up Consult  Pharmacy Consult for Coumadin Indication: atrial fibrillation  Patient Measurements: Height: '5\' 10"'$  (177.8 cm) Weight: 178 lb 9.6 oz (81 kg) IBW/kg (Calculated) : 73  Vital Signs: Temp: 97.1 F (36.2 C) (03/15 0500) Temp Source: Axillary (03/15 0500) BP: 105/80 (03/15 0500) Pulse Rate: 64 (03/15 0946)  Labs:  Recent Labs  11/15/16 1550  11/15/16 2215 11/16/16 0444 11/16/16 1003 11/17/16 0134 11/18/16 0228  HGB 14.4  --   --  12.8*  --  12.4*  --   HCT 44.7  --   --  40.7  --  38.9*  --   PLT 99*  --   --  101*  --  96*  --   LABPROT  --   < > 26.6* 24.3*  --  23.8* 19.3*  INR  --   < > 2.40 2.14  --  2.09 1.61  CREATININE 5.55*  --   --  6.55*  --  8.42*  --   TROPONINI  --   --  0.71* 0.86* 0.63*  --   --   < > = values in this interval not displayed. ESRD  Assessment: 61 y.o with h/o bioprosthetic AVR, PAF, on coumadin PTA for PAF. Pharmacy consulted to dose inpatient. INR 2.4 on admit, now 1.61 (INR drop likely due to 3/13 dose missed for unclear reasons). CBC low but stable - Hg 12.4, plt 96. No bleed documented. Noted on amiodarone.  Home regimen: 3.75 mg daily  Goal of Therapy:  INR 2-3 Monitor platelets by anticoagulation protocol: Yes   Plan:  Coumadin 7.'5mg'$  PO x 1 Daily INR Monitor CBC, s/sx bleeding   Elicia Lamp, PharmD, BCPS Clinical Pharmacist 11/18/2016 10:09 AM

## 2016-11-18 NOTE — Progress Notes (Signed)
Patient ID: [name], male   DOB: 11-16-1955, 61 y.o.   MRN: [id number] S:Feels much better today O:BP 105/80 (BP Location: Right Arm)   Pulse 66   Temp 97.1 F (36.2 C) (Axillary)   Resp 20   Ht '5\' 10"'$  (1.778 m)   Wt 81 kg (178 lb 9.6 oz)   SpO2 96%   BMI 25.63 kg/m   Intake/Output Summary (Last 24 hours) at 11/18/16 0844 Last data filed at 11/18/16 0500  Gross per 24 hour  Intake              600 ml  Output             3000 ml  Net            -2400 ml   Intake/Output: I/O last 3 completed shifts: In: 720 [P.O.:720] Out: 3000 [Other:3000]  Intake/Output this shift:  No intake/output data recorded. Weight change:  Gen:NAD CVS:no rub Resp: scattered rhonchi bilaterally IRC:VELFYB Ext: minimal edema, LAVF +T/B   Recent Labs Lab 11/11/16 0929 11/15/16 1550 11/16/16 0444 11/17/16 0134  NA 140 140 139 139  K 4.6 4.8 4.4 5.2*  CL 98* 96* 97* 96*  CO2 '29 31 28 30  '$ GLUCOSE 84 96 93 119*  BUN 31* 25* 34* 55*  CREATININE 6.28* 5.55* 6.55* 8.42*  ALBUMIN  --  3.6 3.3*  --   CALCIUM 7.5* 7.7* 7.1* 7.5*  PHOS  --   --  6.3*  --   AST  --  29 26  --   ALT  --  27 23  --    Liver Function Tests:  Recent Labs Lab 11/15/16 1550 11/16/16 0444  AST 29 26  ALT 27 23  ALKPHOS 136* 116  BILITOT 1.1 1.1  PROT 8.7* 8.0  ALBUMIN 3.6 3.3*   No results for input(s): LIPASE, AMYLASE in the last 168 hours. No results for input(s): AMMONIA in the last 168 hours. CBC:  Recent Labs Lab 11/11/16 0929 11/15/16 1550 11/16/16 0444 11/17/16 0134  WBC 6.2 7.1 7.0 7.9  NEUTROABS  --  5.8  --   --   HGB 13.8 14.4 12.8* 12.4*  HCT 42.9 44.7 40.7 38.9*  MCV 110.9* 109.0* 109.4* 109.0*  PLT 126* 99* 101* 96*   Cardiac Enzymes:  Recent Labs Lab 11/15/16 2215 11/16/16 0444 11/16/16 1003  TROPONINI 0.71* 0.86* 0.63*   CBG: No results for input(s): GLUCAP in the last 168 hours.  Iron Studies: No results for input(s): IRON, TIBC, TRANSFERRIN, FERRITIN in the last 72  hours. Studies/Results: No results found. Marland Kitchen amiodarone  200 mg Oral Daily  . atorvastatin  20 mg Oral q1800  . calcium acetate  4,002 mg Oral TID WC  . citalopram  20 mg Oral QHS  . clopidogrel  75 mg Oral Daily  . docusate sodium  400 mg Oral BID  . famotidine  10 mg Oral Daily  . gabapentin  300 mg Oral QHS  . guaiFENesin  600 mg Oral BID  . ipratropium-albuterol  3 mL Nebulization QID  . midodrine  10 mg Oral Q M,W,F-HD  . multivitamin  1 tablet Oral QHS  . oseltamivir  30 mg Oral Q M,W,F-1800  . sodium chloride flush  3 mL Intravenous Q12H  . sodium chloride flush  3 mL Intravenous Q12H  . sodium polystyrene  15 g Oral Q T,Th,S,Su-1800  . warfarin  3.75 mg Oral q1800  . Warfarin - Pharmacist Dosing Inpatient  Does not apply q1800    BMET    Component Value Date/Time   NA 139 11/17/2016 0134   K 5.2 (H) 11/17/2016 0134   CL 96 (L) 11/17/2016 0134   CO2 30 11/17/2016 0134   GLUCOSE 119 (H) 11/17/2016 0134   BUN 55 (H) 11/17/2016 0134   CREATININE 8.42 (H) 11/17/2016 0134   CALCIUM 7.5 (L) 11/17/2016 0134   GFRNONAA 6 (L) 11/17/2016 0134   GFRAA 7 (L) 11/17/2016 0134   CBC    Component Value Date/Time   WBC 7.9 11/17/2016 0134   RBC 3.57 (L) 11/17/2016 0134   HGB 12.4 (L) 11/17/2016 0134   HCT 38.9 (L) 11/17/2016 0134   PLT 96 (L) 11/17/2016 0134   MCV 109.0 (H) 11/17/2016 0134   MCH 34.7 (H) 11/17/2016 0134   MCHC 31.9 11/17/2016 0134   RDW 18.1 (H) 11/17/2016 0134   LYMPHSABS 0.5 (L) 11/15/2016 1550   MONOABS 0.7 11/15/2016 1550   EOSABS 0.1 11/15/2016 1550   BASOSABS 0.0 11/15/2016 1550    Dialysis Orders:  [place] MWF 4h 160F BFR 450 2/2.25 Prof 2 EDW 80 kg LAVF Heparin 2000 U  -No VDRA -No ESA    Assessment/ Plan:   1. Influenza A- on tamiflu renal dosing 2. SOB/COPD exacerbation due to #1. Per primary.  continues to improve and wants to go home. 3. ESRD continue with HD qMWF.  Not getting to edw at outpatient unit.  Was able to UF 3 liters  with HD yesterday and post weight was 81.5kg.  Today weight is 81kg.  Will increase edw to 81kg once stable for discharge.   4. Anemia: no ESA d/t Hgb >12 5. CKD-MBD: cont with binders, s/p Ptx, no VDA 6. Nutrition: renal diet/vitamins 7. Hypertension:stable 8. PAF- on coumadin, dosing per pharmacy. 9. Disposition- stable for discharge from a renal standpoint.   Donetta Potts, MD [employer] [phone]

## 2016-11-18 NOTE — Evaluation (Signed)
Occupational Therapy Evaluation Patient Details Name: [name] MRN: [id number] DOB: 31-Mar-1956 Today's Date: 11/18/2016    History of Present Illness [name] is a 61 y.o. male with medical history significant of CAD, bioprosthetic aortic valve replacement paroxysmal atrial fibrillation on Coumadin, end-stage renal disease on hemodialysis M,W and F. OSA, SVT, chronic respiratory failure on home oxygen, COPD.    Clinical Impression   Pt reports he was independent with ADL PTA. Currently pt overall min guard for ADL and functional mobility. SpO2=79% on RA with activity; back up to 89-90% on 4L with seated rest. Educated pt and wife on breathing strategies and energy conservation strategies for home. Pt planning to d/c home with supervision/assist from his wife. Pt would benefit from continued skilled OT to address established goals.    Follow Up Recommendations  No OT follow up;Supervision - Intermittent    Equipment Recommendations  None recommended by OT    Recommendations for Other Services       Precautions / Restrictions Precautions Precautions: Other (comment) Precaution Comments: watch sats Restrictions Weight Bearing Restrictions: No      Mobility Bed Mobility               General bed mobility comments: Pt OOB upon arrival.  Transfers Overall transfer level: Needs assistance Equipment used: None Transfers: Sit to/from Stand Sit to Stand: Supervision              Balance Overall balance assessment: Needs assistance Sitting-balance support: Feet supported;No upper extremity supported Sitting balance-Leahy Scale: Normal     Standing balance support: No upper extremity supported;During functional activity Standing balance-Leahy Scale: Good                              ADL Overall ADL's : Needs assistance/impaired Eating/Feeding: Independent;Sitting   Grooming: Supervision/safety;Standing   Upper Body Bathing: Supervision/  safety;Sitting   Lower Body Bathing: Min guard;Sit to/from stand   Upper Body Dressing : Supervision/safety;Sitting   Lower Body Dressing: Min guard;Sit to/from stand   Toilet Transfer: Min guard;Ambulation;Regular Glass blower/designer Details (indicate cue type and reason): Simulated         Functional mobility during ADLs: Min guard General ADL Comments: Educated pt and wife on energy conservation strategies for home. SpO2 down to 79% on RA with activity. Back up to 89-90% on 4L supplemental O2.     Vision         Perception     Praxis      Pertinent Vitals/Pain Pain Assessment: No/denies pain     Hand Dominance Right   Extremity/Trunk Assessment Upper Extremity Assessment Upper Extremity Assessment: Generalized weakness   Lower Extremity Assessment Lower Extremity Assessment: Defer to PT evaluation   Cervical / Trunk Assessment Cervical / Trunk Assessment: Normal   Communication Communication Communication: No difficulties   Cognition Arousal/Alertness: Awake/alert Behavior During Therapy: WFL for tasks assessed/performed Overall Cognitive Status: Within Functional Limits for tasks assessed                     General Comments       Exercises       Shoulder Instructions      Home Living Family/patient expects to be discharged to:: Private residence Living Arrangements: Spouse/significant other Available Help at Discharge: Family;Available 24 hours/day Type of Home: House Home Access: Other (comment) (lip at doorway)     Home Layout: One level  Bathroom Shower/Tub: Occupational psychologist: Handicapped height     Home Equipment: Environmental consultant - 2 wheels;Cane - single point;Grab bars - tub/shower;Shower seat - built in          Prior Functioning/Environment Level of Independence: Independent        Comments: driving PTA        OT Problem List: Decreased activity tolerance;Impaired balance (sitting and/or  standing);Cardiopulmonary status limiting activity      OT Treatment/Interventions: Self-care/ADL training;Therapeutic exercise;Energy conservation;DME and/or AE instruction;Therapeutic activities;Patient/family education;Balance training    OT Goals(Current goals can be found in the care plan section) Acute Rehab OT Goals Patient Stated Goal: go home OT Goal Formulation: With patient/family Time For Goal Achievement: 12/02/16 Potential to Achieve Goals: Good ADL Goals Additional ADL Goal #1: Pt will independently recall 3 energy conservation strategies and use during ADL. Additional ADL Goal #2: Pt will gather ADL items and perform UB/LB bathing and dressing at mod I level.  OT Frequency: Min 2X/week   Barriers to D/C:            Co-evaluation              End of Session Equipment Utilized During Treatment: Oxygen Nurse Communication: Mobility status  Activity Tolerance: Patient tolerated treatment well Patient left: in chair;with call bell/phone within reach;with family/visitor present  OT Visit Diagnosis: Unsteadiness on feet (R26.81)                ADL either performed or assessed with clinical judgement  Time: 5146-0479 OT Time Calculation (min): 11 min Charges:  OT General Charges $OT Visit: 1 Procedure OT Evaluation $OT Eval Moderate Complexity: 1 Procedure G-Codes:     Aubrynn Katona A. Ulice Brilliant, M.S., OTR/L Pager: [place] 11/18/2016, 11:29 AM

## 2016-11-18 NOTE — Progress Notes (Signed)
SATURATION QUALIFICATIONS: (This note is used to comply with regulatory documentation for home oxygen)  Patient Saturations on 2L at Rest = 88%  Patient Saturations on 3L at rest = 94%  Patient Saturations on 4 Liters of oxygen while Ambulating = 94%  Please briefly explain why patient needs home oxygen:Pt requires 3-4 L at all times to maintain sats in the [address] [place]

## 2016-11-18 NOTE — Progress Notes (Addendum)
Physical Therapy Treatment/ Discharge Patient Details Name: [name] MRN: [id number] DOB: 1956-08-16 Today's Date: 11/18/2016    History of Present Illness [name] is a 61 y.o. male with medical history significant of CAD, bioprosthetic aortic valve replacement paroxysmal atrial fibrillation on Coumadin, end-stage renal disease on hemodialysis M,W and F. OSA, SVT, chronic respiratory failure on home oxygen, COPD.     PT Comments    Pt very pleasant, states he feels well and moving much better today. Pt able to ambulate without the RW and maintain sats in the 90s on 4L. Pt educated for energy conservation and pursed lip breathing but able to complete gait and functional mobility without assist. No further therapy needs at this time. Recommend continued daily ambulation. Will sign off with pt aware and agreeable.   sats 94% on 3L at rest, 88% on 2L at rest, 94% on 4L with gait HR 64   Follow Up Recommendations  No PT follow up     Equipment Recommendations  None recommended by PT    Recommendations for Other Services       Precautions / Restrictions Precautions Precautions: Other (comment) Precaution Comments: watch sats    Mobility  Bed Mobility               General bed mobility comments: in chair on arrival  Transfers Overall transfer level: Modified independent                  Ambulation/Gait Ambulation/Gait assistance: Supervision Ambulation Distance (Feet): 600 Feet Assistive device: None Gait Pattern/deviations: WFL(Within Functional Limits)   Gait velocity interpretation: at or above normal speed for age/gender General Gait Details: cues for not to talk and walk to maintain O2 sats with cues for pursed lip breathing. Pt with bil hip External rotation, steady gait   Stairs            Wheelchair Mobility    Modified Rankin (Stroke Patients Only)       Balance Overall balance assessment: No apparent balance deficits (not  formally assessed)                                  Cognition Arousal/Alertness: Awake/alert Behavior During Therapy: WFL for tasks assessed/performed Overall Cognitive Status: Within Functional Limits for tasks assessed                      Exercises      General Comments        Pertinent Vitals/Pain Pain Assessment: No/denies pain    Home Living                      Prior Function            PT Goals (current goals can now be found in the care plan section) Progress towards PT goals: Goals met/education completed, patient discharged from PT    Frequency           PT Plan Current plan remains appropriate    Co-evaluation             End of Session Equipment Utilized During Treatment: Oxygen;Gait belt Activity Tolerance: Patient tolerated treatment well Patient left: in chair;with call bell/phone within reach Nurse Communication: Mobility status PT Visit Diagnosis: Difficulty in walking, not elsewhere classified (R26.2)     Time: 5366-4403 PT Time Calculation (min) (ACUTE ONLY): 14 min  Charges:  $  Gait Training: 8-22 mins                    G Codes:       Anberlin Diez B Waverly Chavarria 2016-12-09, 9:49 AM  Elwyn Reach, [place]

## 2016-11-19 ENCOUNTER — Telehealth: Payer: Self-pay | Admitting: *Deleted

## 2016-11-19 DIAGNOSIS — E875 Hyperkalemia: Secondary | ICD-10-CM | POA: Diagnosis not present

## 2016-11-19 DIAGNOSIS — N2581 Secondary hyperparathyroidism of renal origin: Secondary | ICD-10-CM | POA: Diagnosis not present

## 2016-11-19 DIAGNOSIS — N186 End stage renal disease: Secondary | ICD-10-CM | POA: Diagnosis not present

## 2016-11-19 NOTE — Telephone Encounter (Signed)
Spoke with pt's wife and she states he did have Cardioversion on March 8th then had to be admitted to hospital on March 12 to March 15th with Acute on chronic resp failure and Flu He is home to take Prednisone 10 mg  5 tabs times 3 days then 4tabs for 3 days then 3 tabs for 3 days and 2 tabs for 3 days then 1 tab for 3 days and this started on March 15th . She was instructed to give him coumadin 7.'5mg'$  on March 15th and is calling to ask about coumadin dosing and when he needs to be seen  11/15/2016 INR 2.4  11/16/2016 INR 2.14 coumadin 3.'75mg'$  11/17/2016 INR 2.09 coumadin 3.'75mg'$   11/18/2016 INR 1.61 coumadin 7.'5mg'$  Pt's wife instructed to have him take coumadin 3.'75mg'$  today and on Saturday and Sunday and made appt for him to be seen on Monday March 19th and she states understanding also instructed to eat some dark leafy greens on Sunday due to the Prednisone and she again states understanding

## 2016-11-22 ENCOUNTER — Ambulatory Visit (INDEPENDENT_AMBULATORY_CARE_PROVIDER_SITE_OTHER): Payer: Medicare Other | Admitting: *Deleted

## 2016-11-22 DIAGNOSIS — I4892 Unspecified atrial flutter: Secondary | ICD-10-CM

## 2016-11-22 DIAGNOSIS — E875 Hyperkalemia: Secondary | ICD-10-CM | POA: Diagnosis not present

## 2016-11-22 DIAGNOSIS — Z5181 Encounter for therapeutic drug level monitoring: Secondary | ICD-10-CM

## 2016-11-22 DIAGNOSIS — Z7901 Long term (current) use of anticoagulants: Secondary | ICD-10-CM

## 2016-11-22 DIAGNOSIS — Z9861 Coronary angioplasty status: Secondary | ICD-10-CM

## 2016-11-22 DIAGNOSIS — I4891 Unspecified atrial fibrillation: Secondary | ICD-10-CM | POA: Diagnosis not present

## 2016-11-22 DIAGNOSIS — I483 Typical atrial flutter: Secondary | ICD-10-CM | POA: Diagnosis not present

## 2016-11-22 DIAGNOSIS — N2581 Secondary hyperparathyroidism of renal origin: Secondary | ICD-10-CM | POA: Diagnosis not present

## 2016-11-22 DIAGNOSIS — I214 Non-ST elevation (NSTEMI) myocardial infarction: Secondary | ICD-10-CM

## 2016-11-22 DIAGNOSIS — I2510 Atherosclerotic heart disease of native coronary artery without angina pectoris: Secondary | ICD-10-CM

## 2016-11-22 DIAGNOSIS — N186 End stage renal disease: Secondary | ICD-10-CM | POA: Diagnosis not present

## 2016-11-22 LAB — POCT INR: INR: 2.5

## 2016-11-22 NOTE — Discharge Summary (Signed)
[facility] Discharge Summary   Patient: [name] IDP:[id number]   PCP: Teressa Lower, MD DOB: 20-Apr-1956   Date of admission: 11/15/2016   Date of discharge: 11/18/2016    Discharge Diagnoses:  Active Problems:   Hypotension   OSA -not compliant with C-pap   ESRD on hemodialysis   Essential (primary) hypertension   Paroxysmal atrial flutter (HCC)   Chronic anticoagulation-Coumadin   COPD GOLD III    Elevated troponin   CAD (coronary artery disease)   Acute CHF ([place])   CAP (community acquired pneumonia)   Acute respiratory failure with hypoxia ([place])   COPD exacerbation ([place])  Admitted From: home Disposition:  home  Recommendations for Outpatient Follow-up:  1. This follow-up with PCP in one week. 2. Continue anticoagulation as prior scheduled. 3.  Continue hemodialysis as per schedule  Follow-up Information    DOUGH,ROBERT, MD. Schedule an appointment as soon as possible for a visit in 1 week(s).   Specialty:  Family Medicine Contact information: [place] [address] [address] Follow up.   Why:  Home 02- new order sent, pt already established- they will bring portable tank to room for transport on discharge.  Contact information: [place] [address] [phone]          Diet recommendation: Cardiac diet renal  Activity: The patient is advised to gradually reintroduce usual activities.  Discharge Condition: good  Code Status: Full code  History of present illness: As per the H and P dictated on admission, "[name] is a 61 y.o. male with medical history significant of CAD, bioprosthetic aortic valve replacement paroxysmal atrial fibrillation on Coumadin, end-stage renal disease on hemodialysis M,W and F. OSA, SVT, chronic respiratory failure on home oxygen, COPD  Presented with worsening shortness of breath while in dialysis today patient have had episode of  hypotension and his dialysis in the dialysis had to be cut short.   Ever since March 8th when he had a TEE done patient have had some cough is been having wheezing and progressive shortness of breath try to use home inhalers but did not seem to help he is on home oxygen wife had increased from 2 L to 3. wife states he's been more lethargic than usual. He does endorse cold like symptoms cough has been productive of clear sputum is been going on for almost a week. Patient denies any fevers or chills no chest pain associated with this no abdominal pain no nausea vomiting no diarrhea. Reports last night got severely weak trouble walking. Feels better now but not back to baseline.  She states dyspnea improved since arrival to ER better with nebulizer treatments but not back to baseline Regarding pertinent Chronic problems: reguarding history of coronary artery disease followed by cardiology s/p CABG in Aug 2014 with Y SVG to LAD &D2; NSTEMI 01/2015 [facility] s/p DES to LCx and BMS to SVG-LAD, NSTEMI 05/2015 non-flow limiting FFR of Cx, low risk nuc 3/17, recent anterior STEMI 02/2016 s/p PTCA/DES to SVG-LAD extending in native vessel. 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 History of OSA but does not tolerate BiPAP  History of A. fib flutter initially on amiodarone underwent [place] [facility] in November 2017 went into normal sinus rhythm and discharged on amiodarone. In January patient had recurrence of A. fib with RVR with prolonged QTc and his amiodarone was  decreased to 200 twice a day February patient's was in a flutter undergone TEE March 8 showing no thrombus"  Hospital Course:  Summary of his active problems in the hospital is as following. 1. Influenza/COPD exacerbation Chronic diastolic CHF COPD Acute on chronic hypoxic respiratory failure.  -continue tamiflu, prednisone taper, nebs -on home O2 at  baseline  2. Elevated troponin/CAD/CABG/PCI -suspect due to demand from Above, 0.18-0.71-0.86 -Cards consulted, no further work up at present,  -extensive cardiac history: s/p CABG in Aug 2014 , NSTEMI 01/2015  s/p DES to LCx and BMS to SVG-LAD, NSTEMI 05/2015 , recent anterior STEMI 02/2016 s/p PTCA/DES to SVG-LAD extending in native vessel -restart Metoprolol  3. ESRD on hemodialysis  -Renal following, HD per them  4.  Essential (primary) hypertension -was hypotensive initially, BP better, resume BB -also on midodrine with HD  5. Paroxysmal atrial flutter (HCC)  -CHA2DS2 vas score 4: continue current anticoagulation with Coumadin per pharmacy,  -resume low dose metoprolol, continue amiodarone  6. OSA -not compliant with C-pap  7. S/p Bioprosthetic AVR  All other chronic medical condition were stable during the hospitalization.  Patient was seen by physical therapy, who recommended no further therapy outpatient. Home oxygen was arranged On the day of the discharge the patient's vitals were stable, and no other acute medical condition were reported by patient. the patient was felt safe to be discharge at home with family.  Procedures and Results:   Hemodialysis  Consultations:  Cardiology  DISCHARGE MEDICATION: Discharge Medication List as of 11/18/2016  2:26 PM    START taking these medications   Details  Fluticasone-Salmeterol (ADVAIR DISKUS) 250-50 MCG/DOSE AEPB Inhale 1 puff into the lungs daily., Starting Thu 11/18/2016, Normal    guaiFENesin (MUCINEX) 600 MG 12 hr tablet Take 1 tablet (600 mg total) by mouth 2 (two) times daily., Starting Thu 11/18/2016, Normal    ipratropium-albuterol (DUONEB) 0.5-2.5 (3) MG/3ML SOLN Take 3 mLs by nebulization every 4 (four) hours as needed., Starting Thu 11/18/2016, Normal    oseltamivir (TAMIFLU) 30 MG capsule Take 1 capsule (30 mg total) by mouth every Monday, Wednesday, and Friday at 6 PM., Starting Fri 11/19/2016, Until Mon  11/22/2016, Normal    predniSONE (DELTASONE) 10 MG tablet Take '50mg'$  daily for 3days,Take '40mg'$  daily for 3days,Take '30mg'$  daily for 3days,Take '20mg'$  daily for 3days,Take '10mg'$  daily for 3days, then stop, Normal      CONTINUE these medications which have NOT CHANGED   Details  acetaminophen (TYLENOL) 500 MG tablet Take 1,000 mg by mouth every 6 (six) hours as needed for pain or fever., Historical Med    albuterol (PROAIR HFA) 108 (90 BASE) MCG/ACT inhaler Inhale 1-2 puffs into the lungs every 6 (six) hours as needed for shortness of breath. , Starting Mon 07/19/2011, Historical Med    amiodarone (PACERONE) 200 MG tablet Take 1 tablet (200 mg total) by mouth daily., Starting Mon 09/13/2016, Normal    atorvastatin (LIPITOR) 40 MG tablet Take 20 mg by mouth daily at 6 PM. , Historical Med    calcium acetate (PHOSLO) 667 MG capsule Take 4,002 mg by mouth 3 (three) times daily with meals. , Historical Med    citalopram (CELEXA) 20 MG tablet Take 20 mg by mouth at bedtime. , Historical Med    clopidogrel (PLAVIX) 75 MG tablet Take 1 tablet (75 mg total) by mouth daily., Starting Thu 02/19/2016, Normal    docusate sodium (COLACE) 100 MG capsule Take 400 mg by mouth 2 (two) times daily. ,  Historical Med    folic acid (FOLVITE) 1 MG tablet Take 1 mg by mouth daily. , Historical Med    gabapentin (NEURONTIN) 300 MG capsule Take 300 mg by mouth at bedtime. , Historical Med    midodrine (PROAMATINE) 10 MG tablet Take 1 tablet (10 mg total) by mouth every Monday, Wednesday, and Friday with hemodialysis., Starting Wed 07/14/2016, No Print    multivitamin (RENA-VIT) TABS tablet Take 1 tablet by mouth daily., Starting Thu 01/11/2013, Print    nitroGLYCERIN (NITROSTAT) 0.4 MG SL tablet Place 0.4 mg under the tongue every 5 (five) minutes as needed for chest pain (Take one tabe every 5-10 minutes for chest pain. Once you have taken three tabs you need to notify your MD or go to ER if chest pain not gone.).,  Historical Med    ranitidine (ZANTAC) 150 MG tablet Take 150 mg by mouth every morning. , Historical Med    sodium polystyrene (KAYEXALATE) 15 GM/60ML suspension Take 15 g by mouth See admin instructions. Only take 15 g on Sun / Tues / Thurs / Sat (non dialysis days), Historical Med    UNABLE TO FIND Take 1 application by mouth at bedtime. Med Name: BIPAP , Historical Med    warfarin (COUMADIN) 7.5 MG tablet Take 1/2-1 tablet by mouth daily as directed by coumadin clinic, Normal      STOP taking these medications     OXYGEN        Allergies  Allergen Reactions  . Diphenhydramine Itching    Only with IV doses. Tolerates oral.  . Penicillins Other (See Comments)    migraine  . Adhesive [Tape] Rash    Please use paper tape  . Amoxicillin Rash    migraine  . Ativan [Lorazepam] Anxiety  . Other Rash    Please use paper tape   Discharge Instructions    Diet renal 60/70-10-08-1198    Complete by:  As directed    Increase activity slowly    Complete by:  As directed      Discharge Exam: Filed Weights   11/17/16 1400 11/17/16 1815 11/18/16 0500  Weight: 84.5 kg (186 lb 4.6 oz) 81.5 kg (179 lb 10.8 oz) 81 kg (178 lb 9.6 oz)   Vitals:   11/18/16 0500 11/18/16 0946  BP: 105/80   Pulse: 66 64  Resp: 20   Temp: 97.1 F (36.2 C)    General: Appear in no distress, no Rash; Oral Mucosa moist. Cardiovascular: S1 and S2 Present, no Murmur, no JVD Respiratory: Bilateral Air entry present and Clear to Auscultation, no Crackles, no wheezes Abdomen: Bowel Sound present, Soft and no tenderness Extremities: no Pedal edema, no calf tenderness Neurology: Grossly no focal neuro deficit.  The results of significant diagnostics from this hospitalization (including imaging, microbiology, ancillary and laboratory) are listed below for reference.    Significant Diagnostic Studies: Dg Chest 2 View  Result Date: 11/15/2016 CLINICAL DATA:  Productive cough, shortness of breath for 3 days  EXAM: CHEST  2 VIEW COMPARISON:  08/04/2016 FINDINGS: Bilateral diffuse interstitial thickening. Trace right pleural effusion. No pneumothorax. Stable cardiomegaly. Prior CABG. Prior aortic bowel replacement. No acute osseous abnormality. IMPRESSION: Cardiomegaly with mild pulmonary vascular congestion. Electronically Signed   By: Kathreen Devoid   On: 11/15/2016 16:50    Microbiology: Recent Results (from the past 240 hour(s))  Respiratory Panel by PCR     Status: Abnormal   Collection Time: 11/15/16 10:20 PM  Result Value Ref Range Status  Adenovirus NOT DETECTED NOT DETECTED Final   Coronavirus 229E NOT DETECTED NOT DETECTED Final   Coronavirus HKU1 NOT DETECTED NOT DETECTED Final   Coronavirus NL63 NOT DETECTED NOT DETECTED Final   Coronavirus OC43 NOT DETECTED NOT DETECTED Final   Metapneumovirus NOT DETECTED NOT DETECTED Final   Rhinovirus / Enterovirus NOT DETECTED NOT DETECTED Final   Influenza A H3 DETECTED (A) NOT DETECTED Final   Influenza B NOT DETECTED NOT DETECTED Final   Parainfluenza Virus 1 NOT DETECTED NOT DETECTED Final   Parainfluenza Virus 2 NOT DETECTED NOT DETECTED Final   Parainfluenza Virus 3 NOT DETECTED NOT DETECTED Final   Parainfluenza Virus 4 NOT DETECTED NOT DETECTED Final   Respiratory Syncytial Virus NOT DETECTED NOT DETECTED Final   Bordetella pertussis NOT DETECTED NOT DETECTED Final   Chlamydophila pneumoniae NOT DETECTED NOT DETECTED Final   Mycoplasma pneumoniae NOT DETECTED NOT DETECTED Final  MRSA PCR Screening     Status: None   Collection Time: 11/16/16  8:17 AM  Result Value Ref Range Status   MRSA by PCR NEGATIVE NEGATIVE Final    Comment:        The GeneXpert MRSA Assay (FDA approved for NASAL specimens only), is one component of a comprehensive MRSA colonization surveillance program. It is not intended to diagnose MRSA infection nor to guide or monitor treatment for MRSA infections.      Labs: CBC:  Recent Labs Lab  11/15/16 1550 11/16/16 0444 11/17/16 0134  WBC 7.1 7.0 7.9  NEUTROABS 5.8  --   --   HGB 14.4 12.8* 12.4*  HCT 44.7 40.7 38.9*  MCV 109.0* 109.4* 109.0*  PLT 99* 101* 96*   Basic Metabolic Panel:  Recent Labs Lab 11/15/16 1550 11/16/16 0444 11/17/16 0134  NA 140 139 139  K 4.8 4.4 5.2*  CL 96* 97* 96*  CO2 '31 28 30  '$ GLUCOSE 96 93 119*  BUN 25* 34* 55*  CREATININE 5.55* 6.55* 8.42*  CALCIUM 7.7* 7.1* 7.5*  MG  --  2.3  --   PHOS  --  6.3*  --    Liver Function Tests:  Recent Labs Lab 11/15/16 1550 11/16/16 0444  AST 29 26  ALT 27 23  ALKPHOS 136* 116  BILITOT 1.1 1.1  PROT 8.7* 8.0  ALBUMIN 3.6 3.3*   No results for input(s): LIPASE, AMYLASE in the last 168 hours. No results for input(s): AMMONIA in the last 168 hours. Cardiac Enzymes:  Recent Labs Lab 11/15/16 2215 11/16/16 0444 11/16/16 1003  TROPONINI 0.71* 0.86* 0.63*   BNP (last 3 results)  Recent Labs  03/24/16 1526 07/09/16 0759 11/15/16 1550  BNP 1,785.3* 2,414.3* 1,968.8*   CBG: No results for input(s): GLUCAP in the last 168 hours. Time spent: 40 minutes  Signed:  Berle Mull  [facility] 11/18/2016  9:19 AM

## 2016-11-24 DIAGNOSIS — N186 End stage renal disease: Secondary | ICD-10-CM | POA: Diagnosis not present

## 2016-11-24 DIAGNOSIS — E875 Hyperkalemia: Secondary | ICD-10-CM | POA: Diagnosis not present

## 2016-11-24 DIAGNOSIS — N2581 Secondary hyperparathyroidism of renal origin: Secondary | ICD-10-CM | POA: Diagnosis not present

## 2016-11-26 DIAGNOSIS — N186 End stage renal disease: Secondary | ICD-10-CM | POA: Diagnosis not present

## 2016-11-26 DIAGNOSIS — N2581 Secondary hyperparathyroidism of renal origin: Secondary | ICD-10-CM | POA: Diagnosis not present

## 2016-11-26 DIAGNOSIS — E875 Hyperkalemia: Secondary | ICD-10-CM | POA: Diagnosis not present

## 2016-11-29 ENCOUNTER — Ambulatory Visit (INDEPENDENT_AMBULATORY_CARE_PROVIDER_SITE_OTHER): Payer: Medicare Other | Admitting: *Deleted

## 2016-11-29 DIAGNOSIS — I4892 Unspecified atrial flutter: Secondary | ICD-10-CM | POA: Diagnosis not present

## 2016-11-29 DIAGNOSIS — I483 Typical atrial flutter: Secondary | ICD-10-CM | POA: Diagnosis not present

## 2016-11-29 DIAGNOSIS — Z7901 Long term (current) use of anticoagulants: Secondary | ICD-10-CM | POA: Diagnosis not present

## 2016-11-29 DIAGNOSIS — Z9861 Coronary angioplasty status: Secondary | ICD-10-CM | POA: Diagnosis not present

## 2016-11-29 DIAGNOSIS — Z5181 Encounter for therapeutic drug level monitoring: Secondary | ICD-10-CM

## 2016-11-29 DIAGNOSIS — I4891 Unspecified atrial fibrillation: Secondary | ICD-10-CM | POA: Diagnosis not present

## 2016-11-29 DIAGNOSIS — E875 Hyperkalemia: Secondary | ICD-10-CM | POA: Diagnosis not present

## 2016-11-29 DIAGNOSIS — I2510 Atherosclerotic heart disease of native coronary artery without angina pectoris: Secondary | ICD-10-CM | POA: Diagnosis not present

## 2016-11-29 DIAGNOSIS — I214 Non-ST elevation (NSTEMI) myocardial infarction: Secondary | ICD-10-CM | POA: Diagnosis not present

## 2016-11-29 DIAGNOSIS — N186 End stage renal disease: Secondary | ICD-10-CM | POA: Diagnosis not present

## 2016-11-29 DIAGNOSIS — N2581 Secondary hyperparathyroidism of renal origin: Secondary | ICD-10-CM | POA: Diagnosis not present

## 2016-11-29 LAB — POCT INR: INR: 1.9

## 2016-12-01 DIAGNOSIS — N186 End stage renal disease: Secondary | ICD-10-CM | POA: Diagnosis not present

## 2016-12-01 DIAGNOSIS — N2581 Secondary hyperparathyroidism of renal origin: Secondary | ICD-10-CM | POA: Diagnosis not present

## 2016-12-01 DIAGNOSIS — E875 Hyperkalemia: Secondary | ICD-10-CM | POA: Diagnosis not present

## 2016-12-02 DIAGNOSIS — J449 Chronic obstructive pulmonary disease, unspecified: Secondary | ICD-10-CM | POA: Diagnosis not present

## 2016-12-02 DIAGNOSIS — R0902 Hypoxemia: Secondary | ICD-10-CM | POA: Diagnosis not present

## 2016-12-03 ENCOUNTER — Telehealth: Payer: Self-pay | Admitting: Cardiovascular Disease

## 2016-12-03 ENCOUNTER — Ambulatory Visit (INDEPENDENT_AMBULATORY_CARE_PROVIDER_SITE_OTHER): Payer: Medicare Other | Admitting: Cardiovascular Disease

## 2016-12-03 ENCOUNTER — Encounter: Payer: Self-pay | Admitting: Cardiovascular Disease

## 2016-12-03 VITALS — BP 95/76 | HR 108 | Ht 70.0 in | Wt 178.4 lb

## 2016-12-03 DIAGNOSIS — I340 Nonrheumatic mitral (valve) insufficiency: Secondary | ICD-10-CM

## 2016-12-03 DIAGNOSIS — Z9861 Coronary angioplasty status: Secondary | ICD-10-CM | POA: Diagnosis not present

## 2016-12-03 DIAGNOSIS — I4892 Unspecified atrial flutter: Secondary | ICD-10-CM | POA: Diagnosis not present

## 2016-12-03 DIAGNOSIS — I10 Essential (primary) hypertension: Secondary | ICD-10-CM | POA: Diagnosis not present

## 2016-12-03 DIAGNOSIS — I2510 Atherosclerotic heart disease of native coronary artery without angina pectoris: Secondary | ICD-10-CM

## 2016-12-03 DIAGNOSIS — N2581 Secondary hyperparathyroidism of renal origin: Secondary | ICD-10-CM | POA: Diagnosis not present

## 2016-12-03 DIAGNOSIS — N186 End stage renal disease: Secondary | ICD-10-CM

## 2016-12-03 DIAGNOSIS — I25810 Atherosclerosis of coronary artery bypass graft(s) without angina pectoris: Secondary | ICD-10-CM

## 2016-12-03 DIAGNOSIS — E875 Hyperkalemia: Secondary | ICD-10-CM | POA: Diagnosis not present

## 2016-12-03 MED ORDER — AMIODARONE HCL 200 MG PO TABS: 200.0000 mg | ORAL_TABLET | Freq: Two times a day (BID) | ORAL | 1 refills | Status: DC

## 2016-12-03 NOTE — Patient Instructions (Signed)
Medication Instructions:  Your physician has recommended you make the following change in your medication:  Increase amiodarone to 200 mg by mouth twice daily.    Labwork: none  Testing/Procedures: none  Follow-Up: Your physician recommends that you schedule a follow-up appointment in: about 10 days with [name], NP in [place] [facility].  Your physician recommends that you schedule a follow-up appointment in: 3-4 months with Dr. Angelena Form.     Any Other Special Instructions Will Be Listed Below (If Applicable).     If you need a refill on your cardiac medications before your next appointment, please call your pharmacy.

## 2016-12-03 NOTE — Progress Notes (Signed)
Chief Complaint  Patient presents with  . 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 I met him in 2014 when I performed his heart cath at [facility] following cardiac arrest after a VATS for lung cancer. He was found to have moderate CAD at that time. He failed medical management and then underwent CABG and AVR at [facility]. He had been followed at [facility] since then. In May 2016, he was admitted for a NSTEMI at [facility] and underwent PCI of the left circumflex (Resolute drug-eluting stent) and SVG to LAD (Herculink bare metal stent). Echocardiogram done prior to admission in 04/2015 showed an EF of 14-97%, grade 1 diastolic dysfunction, mild to moderate aortic regurgitation and mild MS/MR. Per notes he has prior history of PAF during HD but very short lived. He was admitted to [facility] 9/14-9/20/16, prompted by development of recurrent symptomatic atrial arrhythmias during HD session. During that admission, he developed SVT during HD and on arrival to ER was in atrial flutter. He ruled in for NSTEMI with peak troponin 1.54 and underwent a nuclear stress test that was abnormal with apical ischemia. He underwent LHC 05/26/15 which showed 100% mid LAD stenosis, 99% Diagonal stenosis, patent SVG to LAD with ostial 40% stenosis, 65% mid Circumflex stent restenosis with FFR of 0.87 in the Circumflex. With the nonischemic FFR of the circumflex lesion, it was felt that this was not a flow-limiting lesion that could explain his high risk stress test. This is the distribution that his stress test showed positivity during his non-STEMI in May.  It was felt possible that this is simply infarct with peri-infarct ischemia in the setting of atrial fibrillation. It was also felt that his low EF by stress test was due to possible nuclear gating error. No coronary intervention was required. Continued medical therapy was recommended. Echo 05/27/15: EF 55-60%, no RWMA, mild AS/mild AI, mod MS, severely dilated LA. Nuclear stress test 11/2015 in prep for repeat transplant was low risk at [facility]. In 01/2016, he was cleared to stop Plavix but remained on ASA + Coumadin. Per notes, it was felt by the transplant team that he would need to be off Plavix to be considered for transplantation. He was admitted 02/16/16 with midsternal chest pain with radiation to his jaw/teeth. EKG showed anterior STEMI. LHC 02/15/16: acute occlusion of SVG-LAD at anastamosis, patent LCx with moderate restenosis, moderate RCA disease, patent Y graft to diagonal with moderate ostial stenosis, s/p angioplasty/DES to SVG-LAD extending into the native vessel - this was a difficult procedure c/b large right groin hematoma. Echo  02/15/16: EF 55-60%, HK basal mid ateroseptal myocardium and HK of apical anterior myocardium, calcified AV with mild AS, mild AI, calcified MV with mobility restriction of posterior leaflet with mild MR, mod dilated LAE, PASP 55mHg. His post-hospital course notable for NSVT, prominent bifascicular block on EKG post-cath, ABL anemia, nocturnal hypoxia (known for patient), and episodic hypotension. Coumadin was held for several days and actually reversed due to supratherapeutic INR and progressive anemia. Hgb was 11.7 on admission and drifted down to a  nadir of 7.9. He received 3 u PRBC total. CT abd pelvis ruled out retroperitoneal hemorrhage. Groin [place] was without pseudoaneurysm. Hgb on day of DC was 10.3 and Coumadin was restarted. The plan was outlined for triple therapy initially and stopping aspirin 1-2 weeks after PCI. He was admitted to [facility] 03/24/16 with volume overload.  Echo 03/25/16 with preserved LVEF.moderate MR, bioprosthetic AVR. Grade 2 diastolic dysfunction. Re-admitted to [facility] November 2017 with dyspnea and found to be back in atrial fib/flutter. He was placed on amiodarone and underwent DCCV on 07/12/16 with return to sinus. He has been followed in the atrial fib clinic. He has had paroxysms of atrial fib/flutter over the last 6 months. Cardioverted 11/11/16. Admitted to [facility] 11/15/16 with COPD exacerbation and pneumonia. No volume overload. No ischemic workup. He was influenza A positive. He was in sinus.   His wife called our office today stating that the patient had been seen in primary care yesterday and was doing well but felt poorly in HD today. HR erratic per wife. He denies any recurrent CP, SOB, or palpitations. His energy level is normal.   Primary Care Physician: Teressa Lower, MD   Past Medical History:  Diagnosis Date  . Acute blood loss anemia    a. 02/2016 due to groin hematoma. Rec 3 U PRBC.  Marland Kitchen Anemia   . Aortic heart valve prolapse 04/2013   a. s/p bioprosthetic AVR at time of CABG.  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 Bifascicular block   . CAD (coronary artery disease) 04/2013   a. 04/2013: s/p CABG x 2 (Y SVG -LAD & D2). b. 01/2015: NSTEMI s/p DES to LCx, BMS to SVG-LAD. c. NSTEMI 05/2015 during AF/AFL - non-flow limiting FFR. d. Low risk nuc 3/17. e. STEMI 6/17 after coming off Plavix, s/p DES to SVG-LAD into native vessel.  . Carotid artery disease ([place])    a. 1-39% stenosis bilaterally in 06/2015.   Marland Kitchen Chronic respiratory failure ([place])   . 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 b. He had a renal Tx from 2008 to 2011, but subsequent rejection - Gets HD in [place], [place] on MWF schedule.    . Hematoma    a. Large right groin hematoma after cath 02/2016 with associated ABL anemia.  . Hypertension   . Multiple sclerosis ([place])   . Nocturnal hypoxemia   . On home oxygen therapy      pt states he has not been wearing it  . PAF (paroxysmal atrial fibrillation) ([place])    a. h/o, placed on amiodarone 07/2016 due to recurrence.  . Paroxysmal atrial flutter ([place])    a. During 05/2015 admission - SVT, atrial flutter, and PAF.  Marland Kitchen Peripheral vascular disease ([place])    Cath in 01/2015 required 45 cm [place]  . Renal transplant failure and rejection   . 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 intolerant of bipap.  Marland Kitchen SVT (supraventricular tachycardia) ([place])   . Valvular heart disease    a. 2D Echo 03/25/16: mild LVH, EF 50-55%, grade 2 Dd, AVR present with mild AI, mod MR, severely dilated LA, mildly dilated RV, mod RAE, PASP 72.    Past Surgical History:  Procedure Laterality Date  . AV FISTULA PLACEMENT    . CARDIAC CATHETERIZATION N/A 05/26/2015   Procedure: Left Heart Cath and  Coronary Angiography;  Surgeon: Leonie Man, MD;  Location: [place] CV LAB;  Service: Cardiovascular;  Laterality: N/A;  . CARDIAC CATHETERIZATION N/A 05/26/2015   Procedure: Intravascular Pressure Wire/FFR Study;  Surgeon: Leonie Man, MD;  Location: [place] [facility];  Service: Cardiovascular;  Laterality: N/A;  . CARDIAC CATHETERIZATION N/A 02/15/2016   Procedure: Left Heart Cath and Coronary Angiography;  Surgeon: Wellington Hampshire, MD;  Location: [place] CV LAB;  Service: Cardiovascular;  Laterality: N/A;  . CARDIOVERSION N/A 07/12/2016   Procedure: CARDIOVERSION;  Surgeon: Minus Breeding, MD;  Location: [facility] ENDOSCOPY;  Service: Cardiovascular;  Laterality: N/A;  . CARDIOVERSION N/A 11/11/2016   Procedure: CARDIOVERSION;  Surgeon: Fay Records, MD;  Location: [place];  Service: Cardiovascular;  Laterality: N/A;  . CORONARY ARTERY BYPASS GRAFT    . excised squamous cells at rectum  2006  . flash     flash pulmonary edema  . HERNIA REPAIR  07/2011  . KIDNEY TRANSPLANT  08/2007  . LEFT HEART CATHETERIZATION WITH CORONARY ANGIOGRAM N/A  01/09/2013   Procedure: LEFT HEART CATHETERIZATION WITH CORONARY ANGIOGRAM;  Surgeon: Burnell Blanks, MD;  Location: [facility] CATH LAB;  Service: Cardiovascular;  Laterality: N/A;  . TEE WITHOUT CARDIOVERSION N/A 11/11/2016   Procedure: TRANSESOPHAGEAL ECHOCARDIOGRAM (TEE);  Surgeon: Fay Records, MD;  Location: [facility] ENDOSCOPY;  Service: Cardiovascular;  Laterality: N/A;    Current Outpatient Prescriptions  Medication Sig Dispense Refill  . acetaminophen (TYLENOL) 500 MG tablet Take 1,000 mg by mouth every 6 (six) hours as needed for pain or fever.    Marland Kitchen albuterol (PROAIR HFA) 108 (90 BASE) MCG/ACT inhaler Inhale 1-2 puffs into the lungs every 6 (six) hours as needed for shortness of breath.     Marland Kitchen atorvastatin (LIPITOR) 40 MG tablet Take 20 mg by mouth daily at 6 PM.     . calcium acetate (PHOSLO) 667 MG capsule Take 4,002 mg by mouth 3 (three) times daily with meals.     . citalopram (CELEXA) 20 MG tablet Take 20 mg by mouth at bedtime.     . clopidogrel (PLAVIX) 75 MG tablet Take 1 tablet (75 mg total) by mouth daily. 90 tablet 3  . docusate sodium (COLACE) 100 MG capsule Take 400 mg by mouth 2 (two) times daily.     . folic acid (FOLVITE) 1 MG tablet Take 1 mg by mouth daily.     Marland Kitchen gabapentin (NEURONTIN) 300 MG capsule Take 300 mg by mouth at bedtime.     Marland Kitchen guaiFENesin (MUCINEX) 600 MG 12 hr tablet Take 1 tablet (600 mg total) by mouth 2 (two) times daily. 14 tablet 0  . ipratropium-albuterol (DUONEB) 0.5-2.5 (3) MG/3ML SOLN Take 3 mLs by nebulization every 4 (four) hours as needed. 120 mL 0  . midodrine (PROAMATINE) 10 MG tablet Take 1 tablet (10 mg total) by mouth every Monday, Wednesday, and Friday with hemodialysis.    Marland Kitchen multivitamin (RENA-VIT) TABS tablet Take 1 tablet by mouth daily. 30 tablet 1  . nitroGLYCERIN (NITROSTAT) 0.4 MG SL tablet Place 0.4 mg under the tongue every 5 (five) minutes as needed for chest pain (Take one tabe every 5-10 minutes for chest pain. Once you have taken three  tabs you need to notify your MD or go to ER if chest pain not gone.).    Marland Kitchen predniSONE (DELTASONE) 10 MG tablet Take 6m daily for 3days,Take 410mdaily for 3days,Take 3044maily for 3days,Take 55m47mily for 3days,Take 10mg58mly  for 3days, then stop 45 tablet 0  . ranitidine (ZANTAC) 150 MG tablet Take 150 mg by mouth every morning.     . sodium polystyrene (KAYEXALATE) 15 GM/60ML suspension Take 15 g by mouth See admin instructions. Only take 15 g on Sun / Tues / Thurs / Sat (non dialysis days)    . UNABLE TO FIND Take 1 application by mouth at bedtime. Med Name: BIPAP     . warfarin (COUMADIN) 7.5 MG tablet Take 1/2-1 tablet by mouth daily as directed by coumadin clinic (Patient taking differently: Take 1/2 tab by mouth daily in evenings) 30 tablet 1  . amiodarone (PACERONE) 200 MG tablet Take 1 tablet (200 mg total) by mouth 2 (two) times daily. 180 tablet 1   No current facility-administered medications for this visit.     Allergies  Allergen Reactions  . Diphenhydramine Itching    Only with IV doses. Tolerates oral.  . Penicillins Other (See Comments)    migraine  . Adhesive [Tape] Rash    Please use paper tape  . Amoxicillin Rash    migraine  . Ativan [Lorazepam] Anxiety  . Other Rash    Please use paper tape    Social History   Social History  . Marital status: Married    Spouse name: N/A  . Number of children: N/A  . Years of education: N/A   Occupational History  . Disabled    Social History Main Topics  . Smoking status: Former Smoker    Packs/day: 2.00    Years: 20.00    Types: Cigarettes    Quit date: 08/06/1998  . Smokeless tobacco: Never Used  . Alcohol use No  . Drug use: No  . Sexual activity: Not on file   Other Topics Concern  . Not on file   Social History Narrative   No history of premature CAD in either parents or siblings. However, his maternal grandfather and 2 maternal uncles had coronary artery disease.    Family History  Problem  Relation Age of Onset  . Heart failure Mother     started in 34s  . Emphysema Mother     smoked  . Diabetes Father   . Lupus Sister   . Heart attack Maternal Grandfather   . Heart attack Maternal Uncle   . Heart attack Maternal Aunt   . Hypertension Maternal Aunt   . Stroke Neg Hx     Review of Systems:  As stated in the HPI and otherwise negative.   BP 95/76   Pulse (!) 108   Ht _0  (1.778 m)   Wt 178 lb 6.4 oz (80.9 kg)   BMI 25.60 kg/m   Physical Examination: General: Well developed, well nourished, NAD  HEENT: OP clear, mucus membranes moist  SKIN: warm, dry. No rashes. Neuro: No focal deficits  Musculoskeletal: Muscle strength 5/5 all ext  Psychiatric: Mood and affect normal  Neck: No JVD, no carotid bruits, no thyromegaly, no lymphadenopathy.  Lungs:Clear bilaterally, no wheezes, rhonci, crackles Cardiovascular: Regular rate and rhythm. No murmurs, gallops or rubs. Abdomen:Soft. Bowel sounds present. Non-tender.  Extremities: No lower extremity edema. Pulses are 2 + in the bilateral DP/PT.  Echo 03/25/16: Left ventricle: The cavity size was normal. Wall thickness was   increased in a pattern of mild LVH. Systolic function was normal.   The estimated ejection fraction was in the range of 50% to 55%.   Wall motion was normal; there were no regional wall motion  abnormalities. Features are consistent with a pseudonormal left   ventricular filling pattern, with concomitant abnormal relaxation   and increased filling pressure (grade 2 diastolic dysfunction).   Doppler parameters are consistent with high ventricular filling   pressure. - Aortic valve: A bioprosthesis was present. There was mild   regurgitation. - Mitral valve: Severely calcified annulus. There was moderate   regurgitation. - Left atrium: The atrium was severely dilated. - Right ventricle: The cavity size was mildly dilated. - Right atrium: The atrium was moderately dilated. - Pulmonary  arteries: Systolic pressure was severely increased. PA   peak pressure: 72 mm Hg (S).  Impressions:  - Low normal LV systolic function; grade 2 diastolic dysfunction   with elevated LV filling pressure; biatrial enlargement; s/p AVR   with normal gradients and mild AI; severe MAC with moderate,   eccentric MR (may be underestimated; suggest TEE to further   assess if clinically indicated); mild RVE; mild TR with severely   elevated pulmonary pressure.   EKG:  EKG is ordered today. The ekg ordered today demonstrates atrial flutter, rate 108 bpm. RBBB. Non-specific ST and T wave abn   Recent Labs: 11/15/2016: B Natriuretic Peptide 1,968.8 11/16/2016: ALT 23; Magnesium 2.3; TSH 3.957 11/17/2016: BUN 55; Creatinine, Ser 8.42; Hemoglobin 12.4; Platelets 96; Potassium 5.2; Sodium 139   Lipid Panel    Component Value Date/Time   CHOL 98 (L) 03/05/2016 1125   TRIG 143 03/05/2016 1125   HDL 32 (L) 03/05/2016 1125   CHOLHDL 3.1 03/05/2016 1125   VLDL 29 03/05/2016 1125   LDLCALC 37 03/05/2016 1125     Wt Readings from Last 3 Encounters:  12/03/16 178 lb 6.4 oz (80.9 kg)  11/18/16 178 lb 9.6 oz (81 kg)  11/11/16 177 lb (80.3 kg)     Other studies Reviewed: Additional studies/ records that were reviewed today include: . Review of the above records demonstrates:    Assessment and Plan:   1. Atrial fibrillation/flutter: He appears to be in atrial flutter today. Rate is 108 bpm. Will increase amiodarone to 200 mg BID. Continue coumadin for anti-coagulation. Continue Lopressor. Follow up in atrial fib clinic in 10 days. If hypotension worsens at home or HR increases, he should come into the ED  2. CAD without angina: No chest pain suggestive of angina. He is s/p CABG and  PCI May 2016 at [facility] and most recently anterior STEMI after stopping Plavix due to occlusion of SVG to LAD. STEMI occurred when Plavix was stopped so he could be placed on the renal transplant list. Continue Plavix,  beta blocker and statin. No ASA due to use of coumadin along with Plavix.   3. Aortic valve disease: s/p bioprosthetic AVR. Mild AI by TEE March 2018. He will continue to use antibiotic prophylaxis before dental visits.   4. Mitral regurgiation: moderate by TEE March 2018. Not a surgical candidate.   5. HTN: BP well controlled at home. Slightly hypotensive today.    6. ESRD: on HD  7. Carotid artery disease: Mild bilateral plaque by dopplers October 2016.   8. Chronic diastolic CHF: Weight stable. No LE edema.   Extensive review of records today from hospital stay and visits last year.   Current medicines are reviewed at length with the patient today.  The patient does not have concerns regarding medicines.  The following changes have been made:  no change  Labs/ tests ordered today include:   Orders Placed This Encounter  Procedures  .  EKG 12-Lead    Disposition:   FU with me in 3  months  Signed, Lauree Chandler, MD 12/03/2016 4:30 PM    [address], [place], [place]  [address] Phone: [phone]; Fax: [phone]

## 2016-12-03 NOTE — Telephone Encounter (Signed)
°  New Prob   Wife has some concerns regarding pts HR and BP. States BP and HR have been fluctuating for last 24 hours per wife. Pt was seen by PCP yesterday, all vitals stable at that time. However, pt states after coming home, he started to not feel well. Wife requesting to speak to RN. Please call.

## 2016-12-03 NOTE — Telephone Encounter (Signed)
I spoke with pt's wife. She reports pt saw primary care yesterday for post hospital follow up. Portable oxygen was arranged. BP and HR were OK. Pt was feeling well until about 5 PM yesterday when he felt like he "was getting a cold."  At dialysis today heart rate started out at 82. Pt started feeling anxious and heart rate went up to 120's.  Dialysis was stopped for awhile but was able to be resumed. Heart rate continued to range from 108-120.  BP 107/79-127/87.   After he came home from dialysis heart rate was 118-120.  Pt does not feel like he is going to pass out.  Feels calmer and less anxious than when at dialysis. Weaning off prednisone and has been coughing more.  Heart rate currently ranging from 113-117.  I scheduled pt to see Dr. Angelena Form today at 3:00.

## 2016-12-04 DIAGNOSIS — T8612 Kidney transplant failure: Secondary | ICD-10-CM | POA: Diagnosis not present

## 2016-12-04 DIAGNOSIS — Z992 Dependence on renal dialysis: Secondary | ICD-10-CM | POA: Diagnosis not present

## 2016-12-04 DIAGNOSIS — N186 End stage renal disease: Secondary | ICD-10-CM | POA: Diagnosis not present

## 2016-12-05 ENCOUNTER — Emergency Department (HOSPITAL_COMMUNITY): Payer: Medicare Other

## 2016-12-05 ENCOUNTER — Inpatient Hospital Stay (HOSPITAL_COMMUNITY)
Admission: EM | Admit: 2016-12-05 | Discharge: 2016-12-09 | DRG: 190 | Disposition: A | Discharge status: Home / Self Care | Payer: Medicare Other | Attending: Family Medicine | Admitting: Family Medicine

## 2016-12-05 DIAGNOSIS — J9621 Acute and chronic respiratory failure with hypoxia: Secondary | ICD-10-CM | POA: Diagnosis not present

## 2016-12-05 DIAGNOSIS — Z7901 Long term (current) use of anticoagulants: Secondary | ICD-10-CM

## 2016-12-05 DIAGNOSIS — Z951 Presence of aortocoronary bypass graft: Secondary | ICD-10-CM

## 2016-12-05 DIAGNOSIS — I252 Old myocardial infarction: Secondary | ICD-10-CM

## 2016-12-05 DIAGNOSIS — I739 Peripheral vascular disease, unspecified: Secondary | ICD-10-CM | POA: Diagnosis present

## 2016-12-05 DIAGNOSIS — R0602 Shortness of breath: Secondary | ICD-10-CM

## 2016-12-05 DIAGNOSIS — G35D Multiple sclerosis, unspecified: Secondary | ICD-10-CM | POA: Diagnosis present

## 2016-12-05 DIAGNOSIS — Z880 Allergy status to penicillin: Secondary | ICD-10-CM

## 2016-12-05 DIAGNOSIS — Z7902 Long term (current) use of antithrombotics/antiplatelets: Secondary | ICD-10-CM

## 2016-12-05 DIAGNOSIS — Z91048 Other nonmedicinal substance allergy status: Secondary | ICD-10-CM

## 2016-12-05 DIAGNOSIS — E8889 Other specified metabolic disorders: Secondary | ICD-10-CM | POA: Diagnosis present

## 2016-12-05 DIAGNOSIS — N186 End stage renal disease: Secondary | ICD-10-CM | POA: Diagnosis present

## 2016-12-05 DIAGNOSIS — E875 Hyperkalemia: Secondary | ICD-10-CM | POA: Diagnosis not present

## 2016-12-05 DIAGNOSIS — Z833 Family history of diabetes mellitus: Secondary | ICD-10-CM

## 2016-12-05 DIAGNOSIS — I959 Hypotension, unspecified: Secondary | ICD-10-CM | POA: Diagnosis present

## 2016-12-05 DIAGNOSIS — G35 Multiple sclerosis: Secondary | ICD-10-CM | POA: Diagnosis present

## 2016-12-05 DIAGNOSIS — G4733 Obstructive sleep apnea (adult) (pediatric): Secondary | ICD-10-CM | POA: Diagnosis present

## 2016-12-05 DIAGNOSIS — J441 Chronic obstructive pulmonary disease with (acute) exacerbation: Principal | ICD-10-CM | POA: Diagnosis present

## 2016-12-05 DIAGNOSIS — I5032 Chronic diastolic (congestive) heart failure: Secondary | ICD-10-CM | POA: Diagnosis not present

## 2016-12-05 DIAGNOSIS — J810 Acute pulmonary edema: Secondary | ICD-10-CM | POA: Diagnosis not present

## 2016-12-05 DIAGNOSIS — Z09 Encounter for follow-up examination after completed treatment for conditions other than malignant neoplasm: Secondary | ICD-10-CM

## 2016-12-05 DIAGNOSIS — Z881 Allergy status to other antibiotic agents status: Secondary | ICD-10-CM

## 2016-12-05 DIAGNOSIS — Z825 Family history of asthma and other chronic lower respiratory diseases: Secondary | ICD-10-CM

## 2016-12-05 DIAGNOSIS — Z952 Presence of prosthetic heart valve: Secondary | ICD-10-CM

## 2016-12-05 DIAGNOSIS — I4510 Unspecified right bundle-branch block: Secondary | ICD-10-CM | POA: Diagnosis present

## 2016-12-05 DIAGNOSIS — T8611 Kidney transplant rejection: Secondary | ICD-10-CM | POA: Diagnosis present

## 2016-12-05 DIAGNOSIS — Z953 Presence of xenogenic heart valve: Secondary | ICD-10-CM

## 2016-12-05 DIAGNOSIS — I132 Hypertensive heart and chronic kidney disease with heart failure and with stage 5 chronic kidney disease, or end stage renal disease: Secondary | ICD-10-CM | POA: Diagnosis present

## 2016-12-05 DIAGNOSIS — I340 Nonrheumatic mitral (valve) insufficiency: Secondary | ICD-10-CM | POA: Diagnosis present

## 2016-12-05 DIAGNOSIS — Z955 Presence of coronary angioplasty implant and graft: Secondary | ICD-10-CM

## 2016-12-05 DIAGNOSIS — I9589 Other hypotension: Secondary | ICD-10-CM | POA: Diagnosis present

## 2016-12-05 DIAGNOSIS — Z8249 Family history of ischemic heart disease and other diseases of the circulatory system: Secondary | ICD-10-CM

## 2016-12-05 DIAGNOSIS — I483 Typical atrial flutter: Secondary | ICD-10-CM | POA: Diagnosis present

## 2016-12-05 DIAGNOSIS — Y830 Surgical operation with transplant of whole organ as the cause of abnormal reaction of the patient, or of later complication, without mention of misadventure at the time of the procedure: Secondary | ICD-10-CM | POA: Diagnosis present

## 2016-12-05 DIAGNOSIS — Z832 Family history of diseases of the blood and blood-forming organs and certain disorders involving the immune mechanism: Secondary | ICD-10-CM

## 2016-12-05 DIAGNOSIS — I480 Paroxysmal atrial fibrillation: Secondary | ICD-10-CM | POA: Diagnosis present

## 2016-12-05 DIAGNOSIS — F329 Major depressive disorder, single episode, unspecified: Secondary | ICD-10-CM | POA: Diagnosis present

## 2016-12-05 DIAGNOSIS — I120 Hypertensive chronic kidney disease with stage 5 chronic kidney disease or end stage renal disease: Secondary | ICD-10-CM | POA: Diagnosis not present

## 2016-12-05 DIAGNOSIS — Z992 Dependence on renal dialysis: Secondary | ICD-10-CM

## 2016-12-05 DIAGNOSIS — R1310 Dysphagia, unspecified: Secondary | ICD-10-CM | POA: Diagnosis present

## 2016-12-05 DIAGNOSIS — Z888 Allergy status to other drugs, medicaments and biological substances status: Secondary | ICD-10-CM

## 2016-12-05 DIAGNOSIS — I471 Supraventricular tachycardia: Secondary | ICD-10-CM | POA: Diagnosis present

## 2016-12-05 DIAGNOSIS — N2581 Secondary hyperparathyroidism of renal origin: Secondary | ICD-10-CM | POA: Diagnosis present

## 2016-12-05 DIAGNOSIS — Z9981 Dependence on supplemental oxygen: Secondary | ICD-10-CM

## 2016-12-05 DIAGNOSIS — J811 Chronic pulmonary edema: Secondary | ICD-10-CM

## 2016-12-05 DIAGNOSIS — Z87891 Personal history of nicotine dependence: Secondary | ICD-10-CM

## 2016-12-05 DIAGNOSIS — Z79899 Other long term (current) drug therapy: Secondary | ICD-10-CM

## 2016-12-05 DIAGNOSIS — R000 Tachycardia, unspecified: Secondary | ICD-10-CM

## 2016-12-05 DIAGNOSIS — D631 Anemia in chronic kidney disease: Secondary | ICD-10-CM | POA: Diagnosis present

## 2016-12-05 DIAGNOSIS — I2510 Atherosclerotic heart disease of native coronary artery without angina pectoris: Secondary | ICD-10-CM | POA: Diagnosis present

## 2016-12-05 DIAGNOSIS — J90 Pleural effusion, not elsewhere classified: Secondary | ICD-10-CM | POA: Diagnosis not present

## 2016-12-05 HISTORY — DX: Dyspnea, unspecified: R06.00

## 2016-12-05 HISTORY — DX: Chronic obstructive pulmonary disease, unspecified: J44.9

## 2016-12-05 LAB — MAGNESIUM: MAGNESIUM: 2.3 mg/dL (ref 1.7–2.4)

## 2016-12-05 LAB — I-STAT CHEM 8, ED
BUN: 67 mg/dL — AB (ref 6–20)
CHLORIDE: 101 mmol/L (ref 101–111)
Calcium, Ion: 0.83 mmol/L — CL (ref 1.15–1.40)
Creatinine, Ser: 9.5 mg/dL — ABNORMAL HIGH (ref 0.61–1.24)
Glucose, Bld: 120 mg/dL — ABNORMAL HIGH (ref 65–99)
HEMATOCRIT: 40 % (ref 39.0–52.0)
Hemoglobin: 13.6 g/dL (ref 13.0–17.0)
POTASSIUM: 5.9 mmol/L — AB (ref 3.5–5.1)
SODIUM: 137 mmol/L (ref 135–145)
TCO2: 25 mmol/L (ref 0–100)

## 2016-12-05 LAB — CBC
HEMATOCRIT: 40.2 % (ref 39.0–52.0)
HEMOGLOBIN: 12.9 g/dL — AB (ref 13.0–17.0)
MCH: 34.1 pg — ABNORMAL HIGH (ref 26.0–34.0)
MCHC: 32.1 g/dL (ref 30.0–36.0)
MCV: 106.3 fL — ABNORMAL HIGH (ref 78.0–100.0)
Platelets: 95 10*3/uL — ABNORMAL LOW (ref 150–400)
RBC: 3.78 MIL/uL — AB (ref 4.22–5.81)
RDW: 16.5 % — ABNORMAL HIGH (ref 11.5–15.5)
WBC: 9.4 10*3/uL (ref 4.0–10.5)

## 2016-12-05 LAB — BASIC METABOLIC PANEL
ANION GAP: 18 — AB (ref 5–15)
BUN: 73 mg/dL — ABNORMAL HIGH (ref 6–20)
CALCIUM: 7.8 mg/dL — AB (ref 8.9–10.3)
CO2: 25 mmol/L (ref 22–32)
Chloride: 96 mmol/L — ABNORMAL LOW (ref 101–111)
Creatinine, Ser: 8.9 mg/dL — ABNORMAL HIGH (ref 0.61–1.24)
GFR, EST AFRICAN AMERICAN: 7 mL/min — AB (ref >60)
GFR, EST NON AFRICAN AMERICAN: 6 mL/min — AB (ref >60)
Glucose, Bld: 120 mg/dL — ABNORMAL HIGH (ref 65–99)
POTASSIUM: 6.1 mmol/L — AB (ref 3.5–5.1)
Sodium: 139 mmol/L (ref 135–145)

## 2016-12-05 LAB — HEPATIC FUNCTION PANEL
ALK PHOS: 121 U/L (ref 38–126)
ALT: 37 U/L (ref 17–63)
AST: 20 U/L (ref 15–41)
Albumin: 3.2 g/dL — ABNORMAL LOW (ref 3.5–5.0)
BILIRUBIN INDIRECT: 0.9 mg/dL (ref 0.3–0.9)
Bilirubin, Direct: 0.2 mg/dL (ref 0.1–0.5)
TOTAL PROTEIN: 6.9 g/dL (ref 6.5–8.1)
Total Bilirubin: 1.1 mg/dL (ref 0.3–1.2)

## 2016-12-05 LAB — BRAIN NATRIURETIC PEPTIDE: B Natriuretic Peptide: 1624.4 pg/mL — ABNORMAL HIGH (ref 0.0–100.0)

## 2016-12-05 LAB — PROTIME-INR
INR: 1.98
PROTHROMBIN TIME: 22.8 s — AB (ref 11.4–15.2)

## 2016-12-05 LAB — I-STAT TROPONIN, ED: Troponin i, poc: 0.11 ng/mL (ref 0.00–0.08)

## 2016-12-05 LAB — LIPASE, BLOOD: LIPASE: 39 U/L (ref 11–51)

## 2016-12-05 MED ORDER — ALBUTEROL SULFATE (2.5 MG/3ML) 0.083% IN NEBU
INHALATION_SOLUTION | RESPIRATORY_TRACT | Status: AC
  Administered 2016-12-05: 5 mg
  Filled 2016-12-05: qty 6

## 2016-12-05 MED ORDER — SODIUM CHLORIDE 0.9 % IV SOLN
1.0000 g | Freq: Once | INTRAVENOUS | Status: AC
  Administered 2016-12-05: 1 g via INTRAVENOUS
  Filled 2016-12-05: qty 10

## 2016-12-05 MED ORDER — DEXTROSE 50 % IV SOLN
1.0000 | Freq: Once | INTRAVENOUS | Status: AC
  Administered 2016-12-05: 50 mL via INTRAVENOUS
  Filled 2016-12-05: qty 50

## 2016-12-05 MED ORDER — INSULIN ASPART 100 UNIT/ML ~~LOC~~ SOLN
5.0000 [IU] | Freq: Once | SUBCUTANEOUS | Status: AC
  Administered 2016-12-05: 5 [IU] via INTRAVENOUS
  Filled 2016-12-05: qty 1

## 2016-12-05 MED ORDER — IPRATROPIUM BROMIDE 0.02 % IN SOLN
0.5000 mg | Freq: Once | RESPIRATORY_TRACT | Status: AC
  Administered 2016-12-05: 0.5 mg via RESPIRATORY_TRACT
  Filled 2016-12-05: qty 2.5

## 2016-12-05 MED ORDER — METHYLPREDNISOLONE SODIUM SUCC 125 MG IJ SOLR
125.0000 mg | Freq: Once | INTRAMUSCULAR | Status: AC
  Administered 2016-12-05: 125 mg via INTRAVENOUS
  Filled 2016-12-05: qty 2

## 2016-12-05 MED ORDER — ALBUTEROL SULFATE (2.5 MG/3ML) 0.083% IN NEBU
5.0000 mg | INHALATION_SOLUTION | Freq: Once | RESPIRATORY_TRACT | Status: AC
  Administered 2016-12-05: 5 mg via RESPIRATORY_TRACT
  Filled 2016-12-05: qty 6

## 2016-12-05 NOTE — ED Provider Notes (Signed)
[facility] DEPT Provider Note   CSN: [id number] Arrival date & time: 12/05/16  2128     History   Chief Complaint Chief Complaint  Patient presents with  . Shortness of Breath    HPI [name] is a 61 y.o. male with an extensive medical hx including a-fib, ESRD on dialysis (MWF), anticoagulated on coumadin and plavix presents to the Emergency Department complaining of gradual, persistent, progressively worsening SOB onset 5 days ago.  Pt reports he had a cardioversion for his a-fib on 11/10/16 and was then hospitalized on 11/15/17 for SOB and diagnosed with influenza. He was d/c home with home oxygen at 4lpm via [place] increased from his usual oxygen usage only at night.  He was also given a prednisone taper.  He reports he was feeling much better at the beginning of the week, but when the taper decreased to 2 tabs per day his SOB increased.  He reports his last dose of prednisone was yesterday.  Due to the increasing SOB, pt was seen by Dr. Angelena Form 3 days ago and found to be in a-flutter.  At that time her amiodarone was increased to '200mg'$  BID.  He was slightly hypotensive at that office visit with SBPs in the 90s.  This afternoon his SBPs dropped into the 80's, but rose again after his nightly dose of amiodarone at 5pm. Pt reports he feels generally unwell.  Denies fevers, chills, CP, abd pain, N/V/D, sick contacts.  He endorses associated fatigue.  No aggravating or alleviating factors.  Pt reports albuterol use at home via nebulizer without improvement of his SOB.     The history is provided by the patient and medical records. No language interpreter was used.    Past Medical History:  Diagnosis Date  . Acute blood loss anemia    a. 02/2016 due to groin hematoma. Rec 3 U PRBC.  Marland Kitchen Anemia   . Aortic heart valve prolapse 04/2013   a. s/p bioprosthetic AVR at time of CABG.  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 Bifascicular block   . CAD (coronary artery disease)  04/2013   a. 04/2013: s/p CABG x 2 (Y SVG -LAD & D2). b. 01/2015: NSTEMI s/p DES to LCx, BMS to SVG-LAD. c. NSTEMI 05/2015 during AF/AFL - non-flow limiting FFR. d. Low risk nuc 3/17. e. STEMI 6/17 after coming off Plavix, s/p DES to SVG-LAD into native vessel.  . Carotid artery disease ([place])    a. 1-39% stenosis bilaterally in 06/2015.   Marland Kitchen Chronic respiratory failure ([place])   . 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 b. He had a renal Tx from 2008 to 2011, but subsequent rejection - Gets HD in [place], [place] on MWF schedule.    . Hematoma    a. Large right groin hematoma after cath 02/2016 with associated ABL anemia.  . Hypertension   . Multiple sclerosis ([place])   . Nocturnal hypoxemia   . On home oxygen therapy    pt states he has not been wearing it  . PAF (paroxysmal atrial fibrillation) ([place])    a. h/o, placed on amiodarone 07/2016 due to recurrence.  . Paroxysmal atrial flutter ([place])    a. During 05/2015 admission - SVT, atrial flutter, and PAF.  Marland Kitchen Peripheral vascular disease ([place])    Cath in 01/2015 required 45 cm [place]  . Renal transplant failure and rejection   . S/P CABG x 2 04/2013  s/p CABG x 2 (Y SVG -LAD & D2);   Marland Kitchen Sleep apnea    a. intolerant of bipap.  Marland Kitchen SVT (supraventricular tachycardia) ([place])   . Valvular heart disease    a. 2D Echo 03/25/16: mild LVH, EF 50-55%, grade 2 Dd, AVR present with mild AI, mod MR, severely dilated LA, mildly dilated RV, mod RAE, PASP 72.    Patient Active Problem List   Diagnosis Date Noted  . Acute pulmonary edema ([place]) 12/06/2016  . CAP (community acquired pneumonia) 11/15/2016  . Acute respiratory failure with hypoxia ([place]) 11/15/2016  . COPD exacerbation ([place]) 11/15/2016  . Long term current use of amiodarone 09/28/2016  . CHF (congestive heart failure) ([place]) 07/09/2016  . CAD (coronary artery disease) 07/09/2016  . Acute CHF ([place]) 07/09/2016  . COPD GOLD III  03/24/2016  . Elevated  troponin 03/24/2016  . Chronic diastolic congestive heart failure ([place]) 03/24/2016  . SOB (shortness of breath) 03/24/2016  . Left leg swelling   . Macrocytic anemia   . Acute blood loss anemia 02/19/2016  . Groin hematoma 02/19/2016  . NSVT (nonsustained ventricular tachycardia) ([place]) 02/19/2016  . Bifascicular block   . Nocturnal hypoxemia   . Chronic anticoagulation-Coumadin 02/15/2016  . History of tissue AVR 2014 02/15/2016  . Hx of CABG x 2 2014 02/15/2016  . Acute ST elevation myocardial infarction (STEMI) ([place]) 02/15/2016  . Encounter for therapeutic drug monitoring 06/04/2015  . Paroxysmal atrial flutter ([place]) 06/04/2015  . SVT (supraventricular tachycardia) ([place]) 06/04/2015  . Anemia   . CAD- s/p CABG in Aug 2014 with Y SVG to LAD & D2; NSTEMI 01/2015 [facility] s/p DES to LCx and BMS to SVG-LAD, NSTEMI 05/2015 non-flow limiting FFR of Cx, STEMI 02/2016 s/p angioplasty/DES to SVG-LAD   . Typical atrial flutter ([place]) 05/21/2015  . PAF (paroxysmal atrial fibrillation) ([place]) 02/21/2015  . CAD of artery bypass graft-occluded SVG-LAD 02/15/16   . Essential (primary) hypertension   . Non-ST elevation MI (NSTEMI) ([place]) 01/09/2013  . OSA -not compliant with C-pap 01/09/2013  . GPC Bacteremia 11/14/2012  . Renal transplant failure and rejection 02/14/2012  . Multiple sclerosis ([place]) 02/14/2012  . Thrombocytopenia ([place]) 02/12/2012  . Hypotension 02/12/2012  . Syncope and collapse 02/12/2012  . Aortic regurgitation 02/12/2012  . ESRD on hemodialysis 02/12/2012    Past Surgical History:  Procedure Laterality Date  . AV FISTULA PLACEMENT    . CARDIAC CATHETERIZATION N/A 05/26/2015   Procedure: Left Heart Cath and Coronary Angiography;  Surgeon: Leonie Man, MD;  Location: [facility] LAB;  Service: Cardiovascular;  Laterality: N/A;  . CARDIAC CATHETERIZATION N/A 05/26/2015   Procedure: Intravascular Pressure Wire/FFR Study;  Surgeon: Leonie Man, MD;  Location: [facility];   Service: Cardiovascular;  Laterality: N/A;  . CARDIAC CATHETERIZATION N/A 02/15/2016   Procedure: Left Heart Cath and Coronary Angiography;  Surgeon: Wellington Hampshire, MD;  Location: [facility];  Service: Cardiovascular;  Laterality: N/A;  . CARDIOVERSION N/A 07/12/2016   Procedure: CARDIOVERSION;  Surgeon: Minus Breeding, MD;  Location: [facility] ENDOSCOPY;  Service: Cardiovascular;  Laterality: N/A;  . CARDIOVERSION N/A 11/11/2016   Procedure: CARDIOVERSION;  Surgeon: Fay Records, MD;  Location: [facility];  Service: Cardiovascular;  Laterality: N/A;  . CORONARY ARTERY BYPASS GRAFT    . excised squamous cells at rectum  2006  . flash     flash pulmonary edema  . HERNIA REPAIR  07/2011  . KIDNEY TRANSPLANT  08/2007  .  LEFT HEART CATHETERIZATION WITH CORONARY ANGIOGRAM N/A 01/09/2013   Procedure: LEFT HEART CATHETERIZATION WITH CORONARY ANGIOGRAM;  Surgeon: Burnell Blanks, MD;  Location: [facility] CATH LAB;  Service: Cardiovascular;  Laterality: N/A;  . TEE WITHOUT CARDIOVERSION N/A 11/11/2016   Procedure: TRANSESOPHAGEAL ECHOCARDIOGRAM (TEE);  Surgeon: Fay Records, MD;  Location: [facility] ENDOSCOPY;  Service: Cardiovascular;  Laterality: N/A;       Home Medications    Prior to Admission medications   Medication Sig Start Date End Date Taking? Authorizing Provider  acetaminophen (TYLENOL) 500 MG tablet Take 1,000 mg by mouth every 6 (six) hours as needed for pain or fever.   Yes Historical Provider, MD  albuterol (PROAIR HFA) 108 (90 BASE) MCG/ACT inhaler Inhale 1-2 puffs into the lungs every 6 (six) hours as needed for shortness of breath.  07/19/11  Yes Historical Provider, MD  amiodarone (PACERONE) 200 MG tablet Take 1 tablet (200 mg total) by mouth 2 (two) times daily. 12/03/16  Yes Burnell Blanks, MD  atorvastatin (LIPITOR) 40 MG tablet Take 20 mg by mouth at bedtime.    Yes Historical Provider, MD  calcium acetate (PHOSLO) 667 MG capsule Take 4,002 mg by mouth 3 (three) times  daily with meals.    Yes Historical Provider, MD  citalopram (CELEXA) 20 MG tablet Take 20 mg by mouth at bedtime.    Yes Historical Provider, MD  clopidogrel (PLAVIX) 75 MG tablet Take 1 tablet (75 mg total) by mouth daily. 02/19/16  Yes Dayna N [facility], PA-C  docusate sodium (COLACE) 100 MG capsule Take 400 mg by mouth 2 (two) times daily.    Yes Historical Provider, MD  folic acid (FOLVITE) 1 MG tablet Take 1 mg by mouth daily.    Yes Historical Provider, MD  gabapentin (NEURONTIN) 300 MG capsule Take 300 mg by mouth at bedtime.    Yes Historical Provider, MD  guaiFENesin (MUCINEX) 600 MG 12 hr tablet Take 1 tablet (600 mg total) by mouth 2 (two) times daily. 11/18/16  Yes Lavina Hamman, MD  ipratropium-albuterol (DUONEB) 0.5-2.5 (3) MG/3ML SOLN Take 3 mLs by nebulization every 4 (four) hours as needed. Patient taking differently: Take 3 mLs by nebulization 3 (three) times daily. scheduled 11/18/16  Yes Lavina Hamman, MD  midodrine (PROAMATINE) 10 MG tablet Take 1 tablet (10 mg total) by mouth every Monday, Wednesday, and Friday with hemodialysis. Patient taking differently: Take 10 mg by mouth See admin instructions. Take 1 tablet (10 mg) by mouth on Monday, Wednesday, Friday 1 1/2 hours before dialysis 07/14/16  Yes [name], MD  multivitamin (RENA-VIT) TABS tablet Take 1 tablet by mouth daily. 01/11/13  Yes Samella Parr, NP  nitroGLYCERIN (NITROSTAT) 0.4 MG SL tablet Place 0.4 mg under the tongue every 5 (five) minutes as needed for chest pain (max 3 doses - if no relief call MD).    Yes Historical Provider, MD  OXYGEN Inhale 4 L into the lungs continuous.   Yes Historical Provider, MD  polyethylene glycol (MIRALAX / GLYCOLAX) packet Take 17 g by mouth at bedtime. Mix in 8 oz liquid and drink   Yes Historical Provider, MD  ranitidine (ZANTAC) 150 MG tablet Take 150 mg by mouth daily.    Yes Historical Provider, MD  sodium polystyrene (KAYEXALATE) 15 GM/60ML suspension Take 15 g by mouth See  admin instructions. Take 60 ml (15 g) by mouth in the morning on non-dialysis days - Sunday, Tuesday, Thursday, Saturday   Yes Historical Provider, MD  warfarin (COUMADIN) 7.5 MG tablet Take 1/2-1 tablet by mouth daily as directed by coumadin clinic Patient taking differently: take 1/2 tablet by mouth daily at bedtime or as directed by coumadin clinic 10/13/16  Yes Sueanne Margarita, MD    Family History Family History  Problem Relation Age of Onset  . Heart failure Mother     started in 32s  . Emphysema Mother     smoked  . Diabetes Father   . Lupus Sister   . Heart attack Maternal Grandfather   . Heart attack Maternal Uncle   . Heart attack Maternal Aunt   . Hypertension Maternal Aunt   . Stroke Neg Hx     Social History Social History  Substance Use Topics  . Smoking status: Former Smoker    Packs/day: 2.00    Years: 20.00    Types: Cigarettes    Quit date: 08/06/1998  . Smokeless tobacco: Never Used  . Alcohol use No     Allergies   Diphenhydramine; Penicillins; Bupropion; Adhesive [tape]; Amoxicillin; Ativan [lorazepam]; and Other   Review of Systems Review of Systems  Constitutional: Positive for fatigue.  Respiratory: Positive for shortness of breath and wheezing.   All other systems reviewed and are negative.    Physical Exam Updated Vital Signs BP 108/85 (BP Location: Right Arm)   Pulse (!) 114   Temp 98.2 F (36.8 C) (Oral)   Resp 18   SpO2 93%   Physical Exam  Constitutional: He appears well-developed and well-nourished. No distress.  Awake, alert,  Pt appears very tired  HENT:  Head: Normocephalic and atraumatic.  Mouth/Throat: Oropharynx is clear and moist. No oropharyngeal exudate.  Eyes: Conjunctivae are normal. No scleral icterus.  Neck: Normal range of motion. Neck supple.  Cardiovascular: Regular rhythm and intact distal pulses.  Tachycardia present.   No murmur heard. Pulses:      Radial pulses are 2+ on the right side, and 2+ on the  left side.       Dorsalis pedis pulses are 2+ on the right side, and 2+ on the left side.  Pulmonary/Chest: Accessory muscle usage (mild) present. Tachypnea noted. He has decreased breath sounds. He has wheezes. He has no rhonchi.  Equal chest expansion  Abdominal: Soft. Bowel sounds are normal. He exhibits no mass. There is no tenderness. There is no rebound and no guarding.    Musculoskeletal: Normal range of motion. He exhibits no edema.  Mild 1+ pitting edema of the left leg, no edema of the right - pt reports baseline Left forearm fistula with palpable thrill, skin clean and intact  Neurological: He is alert.  Speech is clear and goal oriented Moves extremities without ataxia  Skin: Skin is warm and dry. He is not diaphoretic.  Psychiatric: He has a normal mood and affect.  Nursing note and vitals reviewed.    ED Treatments / Results  Labs (all labs ordered are listed, but only abnormal results are displayed) Labs Reviewed  BASIC METABOLIC PANEL - Abnormal; Notable for the following:       Result Value   Potassium 6.1 (*)    Chloride 96 (*)    Glucose, Bld 120 (*)    BUN 73 (*)    Creatinine, Ser 8.90 (*)    Calcium 7.8 (*)    GFR calc non Af Amer 6 (*)    GFR calc Af Amer 7 (*)    Anion gap 18 (*)    All other components within  normal limits  CBC - Abnormal; Notable for the following:    RBC 3.78 (*)    Hemoglobin 12.9 (*)    MCV 106.3 (*)    MCH 34.1 (*)    RDW 16.5 (*)    Platelets 95 (*)    All other components within normal limits  PROTIME-INR - Abnormal; Notable for the following:    Prothrombin Time 22.8 (*)    All other components within normal limits  HEPATIC FUNCTION PANEL - Abnormal; Notable for the following:    Albumin 3.2 (*)    All other components within normal limits  BRAIN NATRIURETIC PEPTIDE - Abnormal; Notable for the following:    B Natriuretic Peptide 1,624.4 (*)    All other components within normal limits  I-STAT CHEM 8, ED - Abnormal;  Notable for the following:    Potassium 5.9 (*)    BUN 67 (*)    Creatinine, Ser 9.50 (*)    Glucose, Bld 120 (*)    Calcium, Ion 0.83 (*)    All other components within normal limits  I-STAT TROPOININ, ED - Abnormal; Notable for the following:    Troponin i, poc 0.11 (*)    All other components within normal limits  LIPASE, BLOOD  MAGNESIUM    EKG  EKG Interpretation  Date/Time:  Sunday December 05 2016 21:33:30 EDT Ventricular Rate:  113 PR Interval:    QRS Duration: 160 QT Interval:  368 QTC Calculation: 504 R Axis:   -100 Text Interpretation:  Suspect sinus tachycardia versus possible atrial tachycardia Anterior infarct , age undetermined T wave abnormality, consider lateral ischemia Abnormal ECG New intraventricular conduction delay Confirmed by [facility] MD, ERIN ([id number]) on 12/06/2016 12:37:03 AM       Radiology Dg Chest 2 View  Result Date: 12/05/2016 CLINICAL DATA:  Worsening dyspnea for 2 days. EXAM: CHEST  2 VIEW COMPARISON:  11/15/2016 FINDINGS: Extensive interstitial fluid or thickening. No confluent airspace consolidation. Small right effusion, extending into the fissures. Stable cardiomegaly.  Prior sternotomy and aortic valvuloplasty. IMPRESSION: Extensive interstitial fluid and small right pleural effusion. No focal airspace consolidation. Stable cardiomegaly. Electronically Signed   By: Andreas Newport M.D.   On: 12/05/2016 22:25    Procedures Procedures (including critical care time)  CRITICAL CARE Performed by: Abigail Butts Total critical care time: 45 minutes Critical care time was exclusive of separately billable procedures and treating other patients. Critical care was necessary to treat or prevent imminent or life-threatening deterioration. Critical care was time spent personally by me on the following activities: development of treatment plan with patient and/or surrogate as well as nursing, discussions with consultants, evaluation of patient's  response to treatment, examination of patient, obtaining history from patient or surrogate, ordering and performing treatments and interventions, ordering and review of laboratory studies, ordering and review of radiographic studies, pulse oximetry and re-evaluation of patient's condition.   Medications Ordered in ED Medications  albuterol (PROVENTIL) (2.5 MG/3ML) 0.083% nebulizer solution 5 mg (5 mg Nebulization Given 12/05/16 2315)  albuterol (PROVENTIL) (2.5 MG/3ML) 0.083% nebulizer solution (5 mg  Given 12/05/16 2141)  calcium gluconate 1 g in sodium chloride 0.9 % 100 mL IVPB (0 g Intravenous Stopped 12/05/16 2325)  insulin aspart (novoLOG) injection 5 Units (5 Units Intravenous Given 12/05/16 2311)  dextrose 50 % solution 50 mL (50 mLs Intravenous Given 12/05/16 2313)  ipratropium (ATROVENT) nebulizer solution 0.5 mg (0.5 mg Nebulization Given 12/05/16 2314)  methylPREDNISolone sodium succinate (SOLU-MEDROL) 125 mg/2 mL injection 125 mg (  125 mg Intravenous Given 12/05/16 2312)     Initial Impression / Assessment and Plan / ED Course  I have reviewed the triage vital signs and the nursing notes.  Pertinent labs & imaging results that were available during my care of the patient were reviewed by me and considered in my medical decision making (see chart for details).  Clinical Course as of Dec 06 37  Sun Dec 05, 2016  2240 Weight is baseline today when compared to trended weights from last hospitalization  [HM]  2252 Elevated but this seems to be baseline for patient Troponin i, poc: (!!) 0.11 [HM]  2252 Elevated with EKG changes Potassium: (!) 6.1 [HM]  2252 Extensive interstitial fluid and small right pleural effusion. No focal airspace consolidation. Stable cardiomegaly.     DG Chest 2 View [HM]  Mon Dec 06, 2016  0003 Discussed with Dr. Justin Mend who will dialyze tonight  [HM]  0020 Discussed with Dr. Hal Hope who will admit  [HM]    Clinical Course User Index [HM] Jarrett Soho Amoura Ransier, PA-C     Patient presents with tachycardia, shortness of breath, slightly elevated troponin and elevated potassium.  He has increased work of breathing. Chest x-ray shows extensive interstitial fluid.  Concern for fluid overload. Patient will need urgent dialysis.   Hyperkalemia with EKG changes. Patient is receiving calcium gluconate, dextrose, and insulin.  Patient with persistent wheezing. Albuterol, Atrovent and distally Medrol ordered.  Some labs still pending. Will consult nephrology. Patient will need admission.   Final Clinical Impressions(s) / ED Diagnoses   Final diagnoses:  SOB (shortness of breath)  ESRD (end stage renal disease) on dialysis (HCC)  Tachycardia  Hyperkalemia    New Prescriptions New Prescriptions   No medications on file     Abigail Butts, PA-C 12/06/16 [place], MD 12/08/16 1734

## 2016-12-05 NOTE — ED Triage Notes (Signed)
Pt states he has been SOB all week; pt presents with oxygen at 4L from home use; pt has hx of COPD, and dialysis on MWF; pt denies missing dialysis day; pt denies pain on arrival. Pt a&ox 4 on arrival.

## 2016-12-06 ENCOUNTER — Encounter (HOSPITAL_COMMUNITY): Payer: Self-pay | Admitting: Internal Medicine

## 2016-12-06 ENCOUNTER — Telehealth (HOSPITAL_COMMUNITY): Payer: Self-pay | Admitting: *Deleted

## 2016-12-06 ENCOUNTER — Inpatient Hospital Stay (HOSPITAL_COMMUNITY): Payer: Medicare Other

## 2016-12-06 DIAGNOSIS — Z992 Dependence on renal dialysis: Secondary | ICD-10-CM | POA: Diagnosis not present

## 2016-12-06 DIAGNOSIS — Z952 Presence of prosthetic heart valve: Secondary | ICD-10-CM | POA: Diagnosis not present

## 2016-12-06 DIAGNOSIS — I2510 Atherosclerotic heart disease of native coronary artery without angina pectoris: Secondary | ICD-10-CM | POA: Diagnosis present

## 2016-12-06 DIAGNOSIS — Z951 Presence of aortocoronary bypass graft: Secondary | ICD-10-CM | POA: Diagnosis not present

## 2016-12-06 DIAGNOSIS — I5032 Chronic diastolic (congestive) heart failure: Secondary | ICD-10-CM | POA: Diagnosis present

## 2016-12-06 DIAGNOSIS — G4733 Obstructive sleep apnea (adult) (pediatric): Secondary | ICD-10-CM | POA: Diagnosis not present

## 2016-12-06 DIAGNOSIS — J810 Acute pulmonary edema: Secondary | ICD-10-CM | POA: Diagnosis present

## 2016-12-06 DIAGNOSIS — I483 Typical atrial flutter: Secondary | ICD-10-CM | POA: Diagnosis not present

## 2016-12-06 DIAGNOSIS — I4892 Unspecified atrial flutter: Secondary | ICD-10-CM | POA: Diagnosis not present

## 2016-12-06 DIAGNOSIS — I9589 Other hypotension: Secondary | ICD-10-CM | POA: Diagnosis not present

## 2016-12-06 DIAGNOSIS — N186 End stage renal disease: Secondary | ICD-10-CM | POA: Diagnosis not present

## 2016-12-06 DIAGNOSIS — J9621 Acute and chronic respiratory failure with hypoxia: Secondary | ICD-10-CM | POA: Diagnosis present

## 2016-12-06 DIAGNOSIS — J441 Chronic obstructive pulmonary disease with (acute) exacerbation: Secondary | ICD-10-CM | POA: Diagnosis not present

## 2016-12-06 DIAGNOSIS — I739 Peripheral vascular disease, unspecified: Secondary | ICD-10-CM | POA: Diagnosis present

## 2016-12-06 DIAGNOSIS — I4510 Unspecified right bundle-branch block: Secondary | ICD-10-CM | POA: Diagnosis present

## 2016-12-06 DIAGNOSIS — I959 Hypotension, unspecified: Secondary | ICD-10-CM | POA: Diagnosis not present

## 2016-12-06 DIAGNOSIS — D631 Anemia in chronic kidney disease: Secondary | ICD-10-CM | POA: Diagnosis present

## 2016-12-06 DIAGNOSIS — R1310 Dysphagia, unspecified: Secondary | ICD-10-CM | POA: Diagnosis not present

## 2016-12-06 DIAGNOSIS — R0602 Shortness of breath: Secondary | ICD-10-CM | POA: Diagnosis not present

## 2016-12-06 DIAGNOSIS — I471 Supraventricular tachycardia: Secondary | ICD-10-CM | POA: Diagnosis present

## 2016-12-06 DIAGNOSIS — Y830 Surgical operation with transplant of whole organ as the cause of abnormal reaction of the patient, or of later complication, without mention of misadventure at the time of the procedure: Secondary | ICD-10-CM | POA: Diagnosis present

## 2016-12-06 DIAGNOSIS — E8889 Other specified metabolic disorders: Secondary | ICD-10-CM | POA: Diagnosis present

## 2016-12-06 DIAGNOSIS — G35 Multiple sclerosis: Secondary | ICD-10-CM | POA: Diagnosis not present

## 2016-12-06 DIAGNOSIS — F329 Major depressive disorder, single episode, unspecified: Secondary | ICD-10-CM | POA: Diagnosis present

## 2016-12-06 DIAGNOSIS — I132 Hypertensive heart and chronic kidney disease with heart failure and with stage 5 chronic kidney disease, or end stage renal disease: Secondary | ICD-10-CM | POA: Diagnosis present

## 2016-12-06 DIAGNOSIS — I120 Hypertensive chronic kidney disease with stage 5 chronic kidney disease or end stage renal disease: Secondary | ICD-10-CM | POA: Diagnosis not present

## 2016-12-06 DIAGNOSIS — N2581 Secondary hyperparathyroidism of renal origin: Secondary | ICD-10-CM | POA: Diagnosis present

## 2016-12-06 DIAGNOSIS — I480 Paroxysmal atrial fibrillation: Secondary | ICD-10-CM | POA: Diagnosis present

## 2016-12-06 DIAGNOSIS — I340 Nonrheumatic mitral (valve) insufficiency: Secondary | ICD-10-CM

## 2016-12-06 DIAGNOSIS — J811 Chronic pulmonary edema: Secondary | ICD-10-CM | POA: Diagnosis not present

## 2016-12-06 DIAGNOSIS — T8611 Kidney transplant rejection: Secondary | ICD-10-CM | POA: Diagnosis present

## 2016-12-06 DIAGNOSIS — E875 Hyperkalemia: Secondary | ICD-10-CM | POA: Diagnosis not present

## 2016-12-06 DIAGNOSIS — R000 Tachycardia, unspecified: Secondary | ICD-10-CM | POA: Diagnosis not present

## 2016-12-06 LAB — TROPONIN I
TROPONIN I: 0.16 ng/mL — AB (ref <0.03)
Troponin I: 0.16 ng/mL (ref <0.03)
Troponin I: 0.11 ng/mL (ref <0.03)

## 2016-12-06 LAB — COMPREHENSIVE METABOLIC PANEL
ALT: 38 U/L (ref 17–63)
ANION GAP: 13 (ref 5–15)
AST: 22 U/L (ref 15–41)
Albumin: 3.5 g/dL (ref 3.5–5.0)
Alkaline Phosphatase: 119 U/L (ref 38–126)
BUN: 22 mg/dL — AB (ref 6–20)
CALCIUM: 8.5 mg/dL — AB (ref 8.9–10.3)
CHLORIDE: 96 mmol/L — AB (ref 101–111)
CO2: 27 mmol/L (ref 22–32)
Creatinine, Ser: 3.89 mg/dL — ABNORMAL HIGH (ref 0.61–1.24)
GFR calc non Af Amer: 15 mL/min — ABNORMAL LOW (ref >60)
GFR, EST AFRICAN AMERICAN: 18 mL/min — AB (ref >60)
Glucose, Bld: 103 mg/dL — ABNORMAL HIGH (ref 65–99)
POTASSIUM: 4.1 mmol/L (ref 3.5–5.1)
SODIUM: 136 mmol/L (ref 135–145)
Total Bilirubin: 0.9 mg/dL (ref 0.3–1.2)
Total Protein: 7.7 g/dL (ref 6.5–8.1)

## 2016-12-06 LAB — CBC
HCT: 39.5 % (ref 39.0–52.0)
Hemoglobin: 12.7 g/dL — ABNORMAL LOW (ref 13.0–17.0)
MCH: 34.3 pg — ABNORMAL HIGH (ref 26.0–34.0)
MCHC: 32.2 g/dL (ref 30.0–36.0)
MCV: 106.8 fL — ABNORMAL HIGH (ref 78.0–100.0)
PLATELETS: 96 10*3/uL — AB (ref 150–400)
RBC: 3.7 MIL/uL — ABNORMAL LOW (ref 4.22–5.81)
RDW: 16.1 % — AB (ref 11.5–15.5)
WBC: 14.1 10*3/uL — AB (ref 4.0–10.5)

## 2016-12-06 LAB — HIV ANTIBODY (ROUTINE TESTING W REFLEX): HIV SCREEN 4TH GENERATION: NONREACTIVE

## 2016-12-06 MED ORDER — MIDODRINE HCL 5 MG PO TABS
ORAL_TABLET | ORAL | Status: AC
  Administered 2016-12-06: 10 mg via ORAL
  Filled 2016-12-06: qty 2

## 2016-12-06 MED ORDER — CLOPIDOGREL BISULFATE 75 MG PO TABS
75.0000 mg | ORAL_TABLET | Freq: Every day | ORAL | Status: DC
  Administered 2016-12-06 – 2016-12-09 (×4): 75 mg via ORAL
  Filled 2016-12-06 (×4): qty 1

## 2016-12-06 MED ORDER — NITROGLYCERIN 0.4 MG SL SUBL: 0.4000 mg | SUBLINGUAL_TABLET | SUBLINGUAL | Status: DC | PRN

## 2016-12-06 MED ORDER — ORAL CARE MOUTH RINSE
15.0000 mL | Freq: Two times a day (BID) | OROMUCOSAL | Status: DC
  Administered 2016-12-06 – 2016-12-09 (×5): 15 mL via OROMUCOSAL

## 2016-12-06 MED ORDER — WARFARIN - PHARMACIST DOSING INPATIENT
Freq: Every day | Status: DC
  Administered 2016-12-07: 18:00:00

## 2016-12-06 MED ORDER — SODIUM CHLORIDE 0.9 % IV SOLN: 100.0000 mL | INTRAVENOUS | Status: DC | PRN

## 2016-12-06 MED ORDER — PENTAFLUOROPROP-TETRAFLUOROETH EX AERO
1.0000 "application " | INHALATION_SPRAY | CUTANEOUS | Status: DC | PRN
  Filled 2016-12-06: qty 30

## 2016-12-06 MED ORDER — ATORVASTATIN CALCIUM 20 MG PO TABS
20.0000 mg | ORAL_TABLET | Freq: Every day | ORAL | Status: DC
  Administered 2016-12-06 – 2016-12-09 (×3): 20 mg via ORAL
  Filled 2016-12-06 (×3): qty 1

## 2016-12-06 MED ORDER — PREDNISONE 50 MG PO TABS
50.0000 mg | ORAL_TABLET | Freq: Every day | ORAL | Status: DC
  Administered 2016-12-07 – 2016-12-09 (×3): 50 mg via ORAL
  Filled 2016-12-06 (×3): qty 1

## 2016-12-06 MED ORDER — LIDOCAINE-PRILOCAINE 2.5-2.5 % EX CREA
1.0000 "application " | TOPICAL_CREAM | CUTANEOUS | Status: DC | PRN
  Filled 2016-12-06: qty 5

## 2016-12-06 MED ORDER — ONDANSETRON HCL 4 MG/2ML IJ SOLN: 4.0000 mg | Freq: Four times a day (QID) | INTRAMUSCULAR | Status: DC | PRN

## 2016-12-06 MED ORDER — ALTEPLASE 2 MG IJ SOLR: 2.0000 mg | Freq: Once | INTRAMUSCULAR | Status: DC | PRN

## 2016-12-06 MED ORDER — FAMOTIDINE 20 MG PO TABS
20.0000 mg | ORAL_TABLET | Freq: Every day | ORAL | Status: DC
  Administered 2016-12-06 – 2016-12-09 (×4): 20 mg via ORAL
  Filled 2016-12-06 (×4): qty 1

## 2016-12-06 MED ORDER — RENA-VITE PO TABS
1.0000 | ORAL_TABLET | Freq: Every day | ORAL | Status: DC
  Administered 2016-12-06 – 2016-12-09 (×3): 1 via ORAL
  Filled 2016-12-06 (×3): qty 1

## 2016-12-06 MED ORDER — MIDODRINE HCL 5 MG PO TABS
10.0000 mg | ORAL_TABLET | ORAL | Status: DC
  Administered 2016-12-06 – 2016-12-08 (×2): 10 mg via ORAL
  Filled 2016-12-06 (×3): qty 2

## 2016-12-06 MED ORDER — ACETAMINOPHEN 650 MG RE SUPP: 650.0000 mg | Freq: Four times a day (QID) | RECTAL | Status: DC | PRN

## 2016-12-06 MED ORDER — IPRATROPIUM-ALBUTEROL 0.5-2.5 (3) MG/3ML IN SOLN
3.0000 mL | Freq: Three times a day (TID) | RESPIRATORY_TRACT | Status: DC
  Administered 2016-12-06 – 2016-12-09 (×7): 3 mL via RESPIRATORY_TRACT
  Filled 2016-12-06 (×10): qty 3

## 2016-12-06 MED ORDER — SODIUM CHLORIDE 0.9 % IV SOLN
125.0000 mg | INTRAVENOUS | Status: DC
  Administered 2016-12-08: 125 mg via INTRAVENOUS
  Filled 2016-12-06 (×2): qty 10

## 2016-12-06 MED ORDER — WARFARIN SODIUM 7.5 MG PO TABS
3.7500 mg | ORAL_TABLET | Freq: Every day | ORAL | Status: DC
  Administered 2016-12-06: 3.75 mg via ORAL
  Filled 2016-12-06: qty 0.5

## 2016-12-06 MED ORDER — ALUM & MAG HYDROXIDE-SIMETH 200-200-20 MG/5ML PO SUSP
15.0000 mL | ORAL | Status: DC | PRN
  Filled 2016-12-06: qty 30

## 2016-12-06 MED ORDER — LIDOCAINE HCL (PF) 1 % IJ SOLN: 5.0000 mL | INTRAMUSCULAR | Status: DC | PRN

## 2016-12-06 MED ORDER — GABAPENTIN 300 MG PO CAPS
300.0000 mg | ORAL_CAPSULE | Freq: Every day | ORAL | Status: DC
  Administered 2016-12-06 – 2016-12-09 (×3): 300 mg via ORAL
  Filled 2016-12-06 (×3): qty 1

## 2016-12-06 MED ORDER — AMIODARONE HCL 200 MG PO TABS
200.0000 mg | ORAL_TABLET | Freq: Two times a day (BID) | ORAL | Status: DC
  Administered 2016-12-06 – 2016-12-09 (×7): 200 mg via ORAL
  Filled 2016-12-06 (×7): qty 1

## 2016-12-06 MED ORDER — CITALOPRAM HYDROBROMIDE 20 MG PO TABS
20.0000 mg | ORAL_TABLET | Freq: Every day | ORAL | Status: DC
  Administered 2016-12-06 – 2016-12-09 (×3): 20 mg via ORAL
  Filled 2016-12-06 (×3): qty 1

## 2016-12-06 MED ORDER — ONDANSETRON HCL 4 MG PO TABS: 4.0000 mg | ORAL_TABLET | Freq: Four times a day (QID) | ORAL | Status: DC | PRN

## 2016-12-06 MED ORDER — IPRATROPIUM-ALBUTEROL 0.5-2.5 (3) MG/3ML IN SOLN: 3.0000 mL | Freq: Four times a day (QID) | RESPIRATORY_TRACT | Status: DC

## 2016-12-06 MED ORDER — FOLIC ACID 1 MG PO TABS
1.0000 mg | ORAL_TABLET | Freq: Every day | ORAL | Status: DC
  Administered 2016-12-06 – 2016-12-09 (×4): 1 mg via ORAL
  Filled 2016-12-06 (×4): qty 1

## 2016-12-06 MED ORDER — DOCUSATE SODIUM 100 MG PO CAPS
400.0000 mg | ORAL_CAPSULE | Freq: Two times a day (BID) | ORAL | Status: DC
  Administered 2016-12-06 – 2016-12-08 (×5): 400 mg via ORAL
  Filled 2016-12-06 (×7): qty 4

## 2016-12-06 MED ORDER — CALCIUM ACETATE (PHOS BINDER) 667 MG PO CAPS
4002.0000 mg | ORAL_CAPSULE | Freq: Three times a day (TID) | ORAL | Status: DC
  Administered 2016-12-06 – 2016-12-09 (×8): 4002 mg via ORAL
  Filled 2016-12-06 (×9): qty 6

## 2016-12-06 MED ORDER — ACETAMINOPHEN 325 MG PO TABS
650.0000 mg | ORAL_TABLET | Freq: Four times a day (QID) | ORAL | Status: DC | PRN
  Administered 2016-12-09: 650 mg via ORAL
  Filled 2016-12-06: qty 2

## 2016-12-06 MED ORDER — GUAIFENESIN ER 600 MG PO TB12
600.0000 mg | ORAL_TABLET | Freq: Two times a day (BID) | ORAL | Status: DC
  Administered 2016-12-06 – 2016-12-09 (×7): 600 mg via ORAL
  Filled 2016-12-06 (×7): qty 1

## 2016-12-06 NOTE — Progress Notes (Signed)
ANTICOAGULATION CONSULT NOTE - Initial Consult  Pharmacy Consult for Coumadin Indication: atrial fibrillation  Allergies  Allergen Reactions  . Diphenhydramine Itching    Only with IV doses. Tolerates oral.  . Penicillins Other (See Comments)    Migraine Has patient had a PCN reaction causing immediate rash, facial/tongue/throat swelling, SOB or lightheadedness with hypotension: no Has patient had a PCN reaction causing severe rash involving mucus membranes or skin necrosis: No Has patient had a PCN reaction that required hospitalization No Has patient had a PCN reaction occurring within the last 10 years: No If all of the above answers are "NO", then may proceed with Cephalosporin use.  . Bupropion Other (See Comments)    Caused anxiety  . Adhesive [Tape] Rash    Please use paper tape  . Amoxicillin Rash    migraine  . Ativan [Lorazepam] Anxiety  . Other Rash    Please use paper tape    Patient Measurements:    Vital Signs: Temp: 98.2 F (36.8 C) (04/01 2141) Temp Source: Oral (04/01 2141) BP: 112/88 (04/02 0215) Pulse Rate: 109 (04/02 0215)  Labs:  Recent Labs  12/05/16 2134 12/05/16 2152 12/05/16 2257  HGB 12.9* 13.6  --   HCT 40.2 40.0  --   PLT 95*  --   --   LABPROT  --   --  22.8*  INR  --   --  1.98  CREATININE 8.90* 9.50*  --     Estimated Creatinine Clearance: 8.4 mL/min (A) (by C-G formula based on SCr of 9.5 mg/dL (H)).   Medical History: Past Medical History:  Diagnosis Date  . Acute blood loss anemia    a. 02/2016 due to groin hematoma. Rec 3 U PRBC.  Marland Kitchen Anemia   . Aortic heart valve prolapse 04/2013   a. s/p bioprosthetic AVR at time of CABG.  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 Bifascicular block   . CAD (coronary artery disease) 04/2013   a. 04/2013: s/p CABG x 2 (Y SVG -LAD & D2). b. 01/2015: NSTEMI s/p DES to LCx, BMS to SVG-LAD. c. NSTEMI 05/2015 during AF/AFL - non-flow limiting FFR. d. Low risk nuc 3/17. e. STEMI  6/17 after coming off Plavix, s/p DES to SVG-LAD into native vessel.  . Carotid artery disease ([place])    a. 1-39% stenosis bilaterally in 06/2015.   Marland Kitchen Chronic respiratory failure ([place])   . 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 b. He had a renal Tx from 2008 to 2011, but subsequent rejection - Gets HD in [place], [place] on MWF schedule.    . Hematoma    a. Large right groin hematoma after cath 02/2016 with associated ABL anemia.  . Hypertension   . Multiple sclerosis ([place])   . Nocturnal hypoxemia   . On home oxygen therapy    pt states he has not been wearing it  . PAF (paroxysmal atrial fibrillation) ([place])    a. h/o, placed on amiodarone 07/2016 due to recurrence.  . Paroxysmal atrial flutter ([place])    a. During 05/2015 admission - SVT, atrial flutter, and PAF.  Marland Kitchen Peripheral vascular disease ([place])    Cath in 01/2015 required 45 cm Destination Sheath  . Renal transplant failure and rejection   . 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 intolerant of bipap.  Marland Kitchen  SVT (supraventricular tachycardia) ([place])   . Valvular heart disease    a. 2D Echo 03/25/16: mild LVH, EF 50-55%, grade 2 Dd, AVR present with mild AI, mod MR, severely dilated LA, mildly dilated RV, mod RAE, PASP 72.    Medications:  No current facility-administered medications on file prior to encounter.    Current Outpatient Prescriptions on File Prior to Encounter  Medication Sig Dispense Refill  . acetaminophen (TYLENOL) 500 MG tablet Take 1,000 mg by mouth every 6 (six) hours as needed for pain or fever.    Marland Kitchen albuterol (PROAIR HFA) 108 (90 BASE) MCG/ACT inhaler Inhale 1-2 puffs into the lungs every 6 (six) hours as needed for shortness of breath.     Marland Kitchen amiodarone (PACERONE) 200 MG tablet Take 1 tablet (200 mg total) by mouth 2 (two) times daily. 180 tablet 1  . atorvastatin (LIPITOR) 40 MG tablet Take 20 mg by mouth at bedtime.     . calcium acetate  (PHOSLO) 667 MG capsule Take 4,002 mg by mouth 3 (three) times daily with meals.     . citalopram (CELEXA) 20 MG tablet Take 20 mg by mouth at bedtime.     . clopidogrel (PLAVIX) 75 MG tablet Take 1 tablet (75 mg total) by mouth daily. 90 tablet 3  . docusate sodium (COLACE) 100 MG capsule Take 400 mg by mouth 2 (two) times daily.     . folic acid (FOLVITE) 1 MG tablet Take 1 mg by mouth daily.     Marland Kitchen gabapentin (NEURONTIN) 300 MG capsule Take 300 mg by mouth at bedtime.     Marland Kitchen guaiFENesin (MUCINEX) 600 MG 12 hr tablet Take 1 tablet (600 mg total) by mouth 2 (two) times daily. 14 tablet 0  . ipratropium-albuterol (DUONEB) 0.5-2.5 (3) MG/3ML SOLN Take 3 mLs by nebulization every 4 (four) hours as needed. (Patient taking differently: Take 3 mLs by nebulization 3 (three) times daily. scheduled) 120 mL 0  . midodrine (PROAMATINE) 10 MG tablet Take 1 tablet (10 mg total) by mouth every Monday, Wednesday, and Friday with hemodialysis. (Patient taking differently: Take 10 mg by mouth See admin instructions. Take 1 tablet (10 mg) by mouth on Monday, Wednesday, Friday 1 1/2 hours before dialysis)    . multivitamin (RENA-VIT) TABS tablet Take 1 tablet by mouth daily. 30 tablet 1  . nitroGLYCERIN (NITROSTAT) 0.4 MG SL tablet Place 0.4 mg under the tongue every 5 (five) minutes as needed for chest pain (max 3 doses - if no relief call MD).     Marland Kitchen ranitidine (ZANTAC) 150 MG tablet Take 150 mg by mouth daily.     . sodium polystyrene (KAYEXALATE) 15 GM/60ML suspension Take 15 g by mouth See admin instructions. Take 60 ml (15 g) by mouth in the morning on non-dialysis days - Sunday, Tuesday, Thursday, Saturday    . warfarin (COUMADIN) 7.5 MG tablet Take 1/2-1 tablet by mouth daily as directed by coumadin clinic (Patient taking differently: take 1/2 tablet by mouth daily at bedtime or as directed by coumadin clinic) 30 tablet 1     Assessment: 61 y.o. male admitted with SOB, h/o Afib, to continue Coumadin  Goal of  Therapy:  INR 2-3 Monitor platelets by anticoagulation protocol: Yes   Plan:  Continue Coumadin 3.75 mg daily Daily INR  Jocilynn Grade, Bronson Curb 12/06/2016,2:41 AM

## 2016-12-06 NOTE — ED Notes (Signed)
Blood drawn and sent to the lab   The pt will be dialyzed as soon as another pt finishes

## 2016-12-06 NOTE — ED Notes (Signed)
2nd iv not needed  Unable to cancel out the order

## 2016-12-06 NOTE — Progress Notes (Signed)
At 1801 patient had 10 beats of Vtach preceeded by a minute of irregular sinus tach. Patient states he could feel his heart pounding when he stood up at the side of the bed, and he felt weak, so he sat back down. After the 10 beats, the patient went back into regular sinus tach. Paged Dr. Maylene Roes. Will continue to monitor patient.

## 2016-12-06 NOTE — Telephone Encounter (Signed)
-----   Message from [name] sent at 12/03/2016  3:41 PM EDT ----- Regarding: 10 day f/u with [name] in Afib Contact: [phone] Can not do Monday Wednesday or fridays before 11 .Marland Kitchen   [name]

## 2016-12-06 NOTE — Consult Note (Signed)
[facility] Renal Consultation Note    Indication for Consultation:  Management of ESRD/hemodialysis; anemia, hypertension/volume and secondary hyperparathyroidism PCP: Teressa Lower, MD   HPI: [name] is a 62 y.o. male with ESRD on MWF HD in [place].  PMHx is significant for failed renal tx, COPD, PAF, CAD s/p CABG, s/p AVR, MS, OSA intolerant of CPAP, s/p parathyroidectomy.  He previously was hypertensive, but hypotension is now an issue requiring midodrine with HD.  He has been noted as having heart rates as high as 120s during his HD treatments.  He had a recent admission mid March for Influenza A.  His last HD was Friday 3/30.  He rans his full time with a net UF of 2.3 leaving 0.6 below his EDW.  He has been leaving slightly below his EDW the last week.  (His EDW was actually raised 1 kg after his March admission).    He presented last evening with worsening SOB. CXR showed pulmonary edema. HR was ~120 - Amiodarone had recently been increased to bid. Hgb was 12.9 WBC 9.4, K 5.9, BNP 1624 trop 0.16, INR 1.98. He was placed on nonrebreather and sent for emergent dialysis.  He is now on nasal O2 but sats drop with talking.  He has been sleeping in a recliner ever since he had a kidney transplant. He quite smoking ~15 years ago. He wears O2 at night but lately more during the day. He uses mininebs at home. He does not make any urine. He denies fever, chills, CP, N, V, D, or constipation. He has a chronic cough.  Past Medical History:  Diagnosis Date  . Acute blood loss anemia    a. 02/2016 due to groin hematoma. Rec 3 U PRBC.  Marland Kitchen Anemia   . Aortic heart valve prolapse 04/2013   a. s/p bioprosthetic AVR at time of CABG.  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 Bifascicular block   . CAD (coronary artery disease) 04/2013   a. 04/2013: s/p CABG x 2 (Y SVG -LAD & D2). b. 01/2015: NSTEMI s/p DES to LCx, BMS to SVG-LAD. c. NSTEMI 05/2015 during AF/AFL - non-flow  limiting FFR. d. Low risk nuc 3/17. e. STEMI 6/17 after coming off Plavix, s/p DES to SVG-LAD into native vessel.  . Carotid artery disease ([place])    a. 1-39% stenosis bilaterally in 06/2015.   Marland Kitchen Chronic respiratory failure ([place])   . 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 b. He had a renal Tx from 2008 to 2011, but subsequent rejection - Gets HD in [place], [place] on MWF schedule.    . Hematoma    a. Large right groin hematoma after cath 02/2016 with associated ABL anemia.  . Hypertension   . Multiple sclerosis ([place])   . Nocturnal hypoxemia   . On home oxygen therapy    pt states he has not been wearing it  . PAF (paroxysmal atrial fibrillation) ([place])    a. h/o, placed on amiodarone 07/2016 due to recurrence.  . Paroxysmal atrial flutter ([place])    a. During 05/2015 admission - SVT, atrial flutter, and PAF.  Marland Kitchen Peripheral vascular disease ([place])    Cath in 01/2015 required 45 cm Destination Sheath  . Renal transplant failure and rejection   . 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 intolerant of bipap.  Marland Kitchen  SVT (supraventricular tachycardia) ([place])   . Valvular heart disease    a. 2D Echo 03/25/16: mild LVH, EF 50-55%, grade 2 Dd, AVR present with mild AI, mod MR, severely dilated LA, mildly dilated RV, mod RAE, PASP 72.   Past Surgical History:  Procedure Laterality Date  . AV FISTULA PLACEMENT    . CARDIAC CATHETERIZATION N/A 05/26/2015   Procedure: Left Heart Cath and Coronary Angiography;  Surgeon: Leonie Man, MD;  Location: [facility];  Service: Cardiovascular;  Laterality: N/A;  . CARDIAC CATHETERIZATION N/A 05/26/2015   Procedure: Intravascular Pressure Wire/FFR Study;  Surgeon: Leonie Man, MD;  Location: [facility];  Service: Cardiovascular;  Laterality: N/A;  . CARDIAC CATHETERIZATION N/A 02/15/2016   Procedure: Left Heart Cath and Coronary Angiography;  Surgeon: Wellington Hampshire, MD;   Location: [facility];  Service: Cardiovascular;  Laterality: N/A;  . CARDIOVERSION N/A 07/12/2016   Procedure: CARDIOVERSION;  Surgeon: Minus Breeding, MD;  Location: [facility] ENDOSCOPY;  Service: Cardiovascular;  Laterality: N/A;  . CARDIOVERSION N/A 11/11/2016   Procedure: CARDIOVERSION;  Surgeon: Fay Records, MD;  Location: [facility];  Service: Cardiovascular;  Laterality: N/A;  . CORONARY ARTERY BYPASS GRAFT    . excised squamous cells at rectum  2006  . flash     flash pulmonary edema  . HERNIA REPAIR  07/2011  . KIDNEY TRANSPLANT  08/2007  . LEFT HEART CATHETERIZATION WITH CORONARY ANGIOGRAM N/A 01/09/2013   Procedure: LEFT HEART CATHETERIZATION WITH CORONARY ANGIOGRAM;  Surgeon: Burnell Blanks, MD;  Location: [facility] CATH LAB;  Service: Cardiovascular;  Laterality: N/A;  . TEE WITHOUT CARDIOVERSION N/A 11/11/2016   Procedure: TRANSESOPHAGEAL ECHOCARDIOGRAM (TEE);  Surgeon: Fay Records, MD;  Location: [facility] ENDOSCOPY;  Service: Cardiovascular;  Laterality: N/A;   Family History  Problem Relation Age of Onset  . Heart failure Mother     started in 30s  . Emphysema Mother     smoked  . Diabetes Father   . Lupus Sister   . Heart attack Maternal Grandfather   . Heart attack Maternal Uncle   . Heart attack Maternal Aunt   . Hypertension Maternal Aunt   . Stroke Neg Hx    Social History:  reports that he quit smoking about 18 years ago. His smoking use included Cigarettes. He has a 40.00 pack-year smoking history. He has never used smokeless tobacco. He reports that he does not drink alcohol or use drugs. Allergies  Allergen Reactions  . Diphenhydramine Itching    Only with IV doses. Tolerates oral.  . Penicillins Other (See Comments)    Migraine Has patient had a PCN reaction causing immediate rash, facial/tongue/throat swelling, SOB or lightheadedness with hypotension: no Has patient had a PCN reaction causing severe rash involving mucus membranes or skin necrosis: No Has  patient had a PCN reaction that required hospitalization No Has patient had a PCN reaction occurring within the last 10 years: No If all of the above answers are "NO", then may proceed with Cephalosporin use.  . Bupropion Other (See Comments)    Caused anxiety  . Adhesive [Tape] Rash    Please use paper tape  . Amoxicillin Rash    migraine  . Ativan [Lorazepam] Anxiety  . Other Rash    Please use paper tape   Prior to Admission medications   Medication Sig Start Date End Date Taking? Authorizing Provider  acetaminophen (TYLENOL) 500 MG tablet Take 1,000 mg by mouth every 6 (  six) hours as needed for pain or fever.   Yes Historical Provider, MD  albuterol (PROAIR HFA) 108 (90 BASE) MCG/ACT inhaler Inhale 1-2 puffs into the lungs every 6 (six) hours as needed for shortness of breath.  07/19/11  Yes Historical Provider, MD  amiodarone (PACERONE) 200 MG tablet Take 1 tablet (200 mg total) by mouth 2 (two) times daily. 12/03/16  Yes Burnell Blanks, MD  atorvastatin (LIPITOR) 40 MG tablet Take 20 mg by mouth at bedtime.    Yes Historical Provider, MD  calcium acetate (PHOSLO) 667 MG capsule Take 4,002 mg by mouth 3 (three) times daily with meals.    Yes Historical Provider, MD  citalopram (CELEXA) 20 MG tablet Take 20 mg by mouth at bedtime.    Yes Historical Provider, MD  clopidogrel (PLAVIX) 75 MG tablet Take 1 tablet (75 mg total) by mouth daily. 02/19/16  Yes Dayna N Dunn, PA-C  docusate sodium (COLACE) 100 MG capsule Take 400 mg by mouth 2 (two) times daily.    Yes Historical Provider, MD  folic acid (FOLVITE) 1 MG tablet Take 1 mg by mouth daily.    Yes Historical Provider, MD  gabapentin (NEURONTIN) 300 MG capsule Take 300 mg by mouth at bedtime.    Yes Historical Provider, MD  guaiFENesin (MUCINEX) 600 MG 12 hr tablet Take 1 tablet (600 mg total) by mouth 2 (two) times daily. 11/18/16  Yes Lavina Hamman, MD  ipratropium-albuterol (DUONEB) 0.5-2.5 (3) MG/3ML SOLN Take 3 mLs by  nebulization every 4 (four) hours as needed. Patient taking differently: Take 3 mLs by nebulization 3 (three) times daily. scheduled 11/18/16  Yes Lavina Hamman, MD  midodrine (PROAMATINE) 10 MG tablet Take 1 tablet (10 mg total) by mouth every Monday, Wednesday, and Friday with hemodialysis. Patient taking differently: Take 10 mg by mouth See admin instructions. Take 1 tablet (10 mg) by mouth on Monday, Wednesday, Friday 1 1/2 hours before dialysis 07/14/16  Yes [name], MD  multivitamin (RENA-VIT) TABS tablet Take 1 tablet by mouth daily. 01/11/13  Yes Samella Parr, NP  nitroGLYCERIN (NITROSTAT) 0.4 MG SL tablet Place 0.4 mg under the tongue every 5 (five) minutes as needed for chest pain (max 3 doses - if no relief call MD).    Yes Historical Provider, MD  OXYGEN Inhale 4 L into the lungs continuous.   Yes Historical Provider, MD  polyethylene glycol (MIRALAX / GLYCOLAX) packet Take 17 g by mouth at bedtime. Mix in 8 oz liquid and drink   Yes Historical Provider, MD  ranitidine (ZANTAC) 150 MG tablet Take 150 mg by mouth daily.    Yes Historical Provider, MD  sodium polystyrene (KAYEXALATE) 15 GM/60ML suspension Take 15 g by mouth See admin instructions. Take 60 ml (15 g) by mouth in the morning on non-dialysis days - Sunday, Tuesday, Thursday, Saturday   Yes Historical Provider, MD  warfarin (COUMADIN) 7.5 MG tablet Take 1/2-1 tablet by mouth daily as directed by coumadin clinic Patient taking differently: take 1/2 tablet by mouth daily at bedtime or as directed by coumadin clinic 10/13/16  Yes Sueanne Margarita, MD   Current Facility-Administered Medications  Medication Dose Route Frequency Provider Last Rate Last Dose  . 0.9 %  sodium chloride infusion  100 mL Intravenous PRN Edrick Oh, MD      . 0.9 %  sodium chloride infusion  100 mL Intravenous PRN Edrick Oh, MD      . acetaminophen (TYLENOL) tablet 650 mg  650 mg Oral Q6H PRN Rise Patience, MD       Or  . acetaminophen  (TYLENOL) suppository 650 mg  650 mg Rectal Q6H PRN Rise Patience, MD      . alteplase (CATHFLO ACTIVASE) injection 2 mg  2 mg Intracatheter Once PRN Edrick Oh, MD      . amiodarone (PACERONE) tablet 200 mg  200 mg Oral BID Rise Patience, MD      . atorvastatin (LIPITOR) tablet 20 mg  20 mg Oral QHS Rise Patience, MD      . calcium acetate (PHOSLO) capsule 4,002 mg  4,002 mg Oral TID WC Rise Patience, MD      . citalopram (CELEXA) tablet 20 mg  20 mg Oral QHS Rise Patience, MD      . clopidogrel (PLAVIX) tablet 75 mg  75 mg Oral Daily Rise Patience, MD      . docusate sodium (COLACE) capsule 400 mg  400 mg Oral BID Rise Patience, MD      . famotidine (PEPCID) tablet 20 mg  20 mg Oral Daily Rise Patience, MD      . folic acid (FOLVITE) tablet 1 mg  1 mg Oral Daily Rise Patience, MD      . gabapentin (NEURONTIN) capsule 300 mg  300 mg Oral QHS Rise Patience, MD      . guaiFENesin (MUCINEX) 12 hr tablet 600 mg  600 mg Oral BID Rise Patience, MD      . ipratropium-albuterol (DUONEB) 0.5-2.5 (3) MG/3ML nebulizer solution 3 mL  3 mL Nebulization TID Rise Patience, MD      . lidocaine (PF) (XYLOCAINE) 1 % injection 5 mL  5 mL Intradermal PRN Edrick Oh, MD      . lidocaine-prilocaine (EMLA) cream 1 application  1 application Topical PRN Edrick Oh, MD      . midodrine (PROAMATINE) tablet 10 mg  10 mg Oral Once per day on Mon Wed Fri Rise Patience, MD   10 mg at 12/06/16 0601  . multivitamin (RENA-VIT) tablet 1 tablet  1 tablet Oral QHS Rise Patience, MD      . nitroGLYCERIN (NITROSTAT) SL tablet 0.4 mg  0.4 mg Sublingual Q5 min PRN Rise Patience, MD      . ondansetron [facility]) tablet 4 mg  4 mg Oral Q6H PRN Rise Patience, MD       Or  . ondansetron [facility]) injection 4 mg  4 mg Intravenous Q6H PRN Rise Patience, MD      . pentafluoroprop-tetrafluoroeth [facility]) aerosol 1 application  1  application Topical PRN Edrick Oh, MD      . warfarin (COUMADIN) tablet 3.75 mg  3.75 mg Oral q1800 Rise Patience, MD      . Warfarin - Pharmacist Dosing Inpatient   Does not apply [id number] Rise Patience, MD       Labs: Basic Metabolic Panel:  Recent Labs Lab 12/05/16 2134 12/05/16 2152  NA 139 137  K 6.1* 5.9*  CL 96* 101  CO2 25  --   GLUCOSE 120* 120*  BUN 73* 67*  CREATININE 8.90* 9.50*  CALCIUM 7.8*  --    Liver Function Tests:  Recent Labs Lab 12/05/16 2257  AST 20  ALT 37  ALKPHOS 121  BILITOT 1.1  PROT 6.9  ALBUMIN 3.2*    Recent Labs Lab 12/05/16 2257  LIPASE 39  CBC:  Recent Labs Lab 12/05/16 2134 12/05/16 2152  WBC 9.4  --   HGB 12.9* 13.6  HCT 40.2 40.0  MCV 106.3*  --   PLT 95*  --    Cardiac Enzymes:  Recent Labs Lab 12/06/16 0229  TROPONINI 0.16*   Studies/Results: Dg Chest 2 View  Result Date: 12/05/2016 CLINICAL DATA:  Worsening dyspnea for 2 days. EXAM: CHEST  2 VIEW COMPARISON:  11/15/2016 FINDINGS: Extensive interstitial fluid or thickening. No confluent airspace consolidation. Small right effusion, extending into the fissures. Stable cardiomegaly.  Prior sternotomy and aortic valvuloplasty. IMPRESSION: Extensive interstitial fluid and small right pleural effusion. No focal airspace consolidation. Stable cardiomegaly. Electronically Signed   By: Andreas Newport M.D.   On: 12/05/2016 22:25    ROS: As per HPI otherwise negative.  Physical Exam: Vitals:   12/06/16 0415 12/06/16 0533 12/06/16 0542 12/06/16 0600  BP: 111/78 (!) 118/93 103/76 116/83  Pulse:  (!) 115 (!) 108 (!) 109  Resp: 20 20    Temp:  98 F (36.7 C)    TempSrc:      SpO2:         General: chronically ill appearing WM sleeping with nasal O2 rouses easily  Head: NCAT sclera not icteric MMM, blister on lower lip Neck: Supple.  Lungs: Dim BS bilateral wheezes Breathing somewhat labored with accessory muscle use Heart:  Regular tachy +/-  115-120 Abdomen: soft NT + BS tx kidney RLQ nontender Lower extremities: left LE 1+ edema right no edema no open wounds  Neuro: A & O  X 3. Moves all extremities spontaneously. Psych:  Responds to questions appropriately with a normal affect. Dialysis Access: left AVF + bruit  Dialysis Orders: MWF [facility] 4 hr 160 450/800 EDW 81 (NEEDS lowering was raised 1 kg at last d/c) 2 K 2.25 -consider ^ 2.5 at d/c) profile left lower AVF heparin 2000 venofer 50 pe4r week - no ESA or VDRA Recent labs: hgb 12.9 30% sat Corr Ca 8.9 P 5.1 iPTH 14 - s/p PTHYM - 6 phoslo ac  Assessment/Plan: 1. Acute respiratory failure secondary to pulmonary edema in the setting of chronic COPD- goal set for 4.3 - getting volume off -off nonrebreather now - uses nocturnal O2 - but needed more often lately - check CXR post HD - likely will need to be scheduled for additional treatment Tuesday for additional UF  2. ESRD - with hyperkalemia K 6.1 (generally runs in 5s) MWF - HD today; EDW raised 1 kg at last d/c mid March- needs to be lowered - has been leaving below EDW last several treatments 3. Chronic hypotension -on midodrine with dialysis 4. Anemia  - hgb 13.6 - not on ESA - only weekly Fe 5. Metabolic bone disease -  Takes 6 phoslo - s/p parathyroidectomy - consider change to 2.5 Ca bath at d/c 6. Nutrition - renal diet/vits 7. AF/flutter - seen by cards 3/30 -  amiodarone recently ^ - sinus tach -last TSH 3.9 11/2015- cardiology note states he is on lopressor - this is not on his med list anywhere; his last d/c summary said restart metoprolol but it was not on his d/c med list--I'm not sure his BP can support but it would help HR - this needs clarification- he tells me he is not taking 8. s/p AVR/CABG - on chronic coumadin/plavix/statin - no BB at present 9. Recent influenza A admission -  10. COPD - per primary - chronic - quit smoking about 15  years ago - on amio - last PFTs Dec 2017- just finished a prednisone taper from  last admission  Myriam Jacobson, Hershal Coria [place] [phone] 12/06/2016, 8:55 AM    Pt seen, examined and agree w A/P as above.  Kelly Splinter MD [employer] pager [phone]   12/06/2016, 12:44 PM

## 2016-12-06 NOTE — Consult Note (Addendum)
Cardiology Consult    Patient ID: [name] MRN: [id number], DOB/AGE: 01/24/1956   Admit date: 12/05/2016 Date of Consult: 12/06/2016  Primary Physician: Teressa Lower, MD Reason for Consult: atrial fibrillation Primary Cardiologist: Dr. Angelena Form Requesting Provider: Dr. Maylene Roes  History of Present Illness    [name] is a 61 y.o. male who is being seen today for the evaluation of atrial fibrillation at the request of Dr Lorne Skeens. 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 He Presented to the ED with persistent shortness of breath that became worse last evening.   On admission he is in atrial flutter with rapid ventricular response. He had a TEE/DCCV on 11/11/16 which converted him to NSR. He was back in atrial flutter with rate of 108 at his office visit on 3/30.   His amiodarone was increased to 200 mg bid. Lopressor was continued. He is on coumadin for anticoagulation. On 3/30 he was not particularly short of breath. On Saturday he developed increased shortness of breath. No chest pain, but he has been having indigestion associated with eating and unlike his previous ischemic chest pain. He does not feel significant palpitations. He has chronic orthopnea.   BNP 1624.4  (previous BNP levels have been 1,085 to 2505) Troponins 0.11, 0.13, 0.16, 0.11 (troponins are  chronically mildly elevated) K+ 4.1 12/05/16 CXR:  Extensive interstitial fluid and small right pleural effusion. No focal airspace consolidation. Stable cardiomegaly. 12/06/16 CXR:  Mild improvement in pulmonary interstitial edema. No alveolar pneumonia. Stable cardiomegaly with central pulmonary vascular congestion. Thoracic aortic atherosclerosis.  Past Medical History   Past Medical History:  Diagnosis Date  . Acute blood loss anemia    a. 02/2016 due to groin hematoma. Rec 3 U PRBC.  Marland Kitchen Anemia   . Aortic heart valve prolapse 04/2013   a. s/p bioprosthetic AVR at time of CABG.  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 Bifascicular block   . CAD (coronary artery disease) 04/2013   a. 04/2013: s/p CABG x 2 (Y SVG -LAD & D2). b. 01/2015: NSTEMI s/p DES to LCx, BMS to SVG-LAD. c. NSTEMI 05/2015 during AF/AFL - non-flow limiting FFR. d. Low risk nuc 3/17. e. STEMI 6/17 after coming off Plavix, s/p DES to SVG-LAD into native vessel.  . Carotid artery disease ([place])    a. 1-39% stenosis bilaterally in 06/2015.   Marland Kitchen Chronic respiratory failure ([place])   . COPD (chronic obstructive pulmonary disease) ([place])   . Dyspnea   . 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 b. He had a renal Tx from 2008 to 2011, but subsequent rejection - Gets HD in [place], [place] on MWF schedule.    . Hematoma    a. Large right groin hematoma after cath 02/2016 with associated ABL anemia.  . Hypertension   . MS (multiple sclerosis) ([place])   . Multiple sclerosis (  HCC)   . Nocturnal hypoxemia   . On home oxygen therapy    pt states he has not been wearing it  . PAF (paroxysmal atrial fibrillation) ([place])    a. h/o, placed on amiodarone 07/2016 due to recurrence.  . Paroxysmal atrial flutter ([place])    a. During 05/2015 admission - SVT, atrial flutter, and PAF.  Marland Kitchen Peripheral vascular disease ([place])    Cath in 01/2015 required 45 cm [place]  . Renal transplant  failure and rejection   . 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 intolerant of bipap.  Marland Kitchen SVT (supraventricular tachycardia) ([place])   . Valvular heart disease    a. 2D Echo 03/25/16: mild LVH, EF 50-55%, grade 2 Dd, AVR present with mild AI, mod MR, severely dilated LA, mildly dilated RV, mod RAE, PASP 72.    Past Surgical History:  Procedure Laterality Date  . AV FISTULA PLACEMENT    . CARDIAC CATHETERIZATION N/A 05/26/2015   Procedure: Left Heart Cath and Coronary Angiography;  Surgeon: Leonie Man, MD;  Location: [place] [facility];  Service: Cardiovascular;  Laterality: N/A;  . CARDIAC CATHETERIZATION N/A 05/26/2015   Procedure: Intravascular Pressure Wire/FFR Study;  Surgeon: Leonie Man, MD;  Location: [place] [facility];  Service: Cardiovascular;  Laterality: N/A;  . CARDIAC CATHETERIZATION N/A 02/15/2016   Procedure: Left Heart Cath and Coronary Angiography;  Surgeon: Wellington Hampshire, MD;  Location: [place] [facility];  Service: Cardiovascular;  Laterality: N/A;  . CARDIOVERSION N/A 07/12/2016   Procedure: CARDIOVERSION;  Surgeon: Minus Breeding, MD;  Location: [facility] ENDOSCOPY;  Service: Cardiovascular;  Laterality: N/A;  . CARDIOVERSION N/A 11/11/2016   Procedure: CARDIOVERSION;  Surgeon: Fay Records, MD;  Location: [facility];  Service: Cardiovascular;  Laterality: N/A;  . CORONARY ARTERY BYPASS GRAFT    . excised squamous cells at rectum  2006  . flash     flash pulmonary edema  . HERNIA REPAIR  07/2011  . KIDNEY TRANSPLANT  08/2007  . KNEE SURGERY    . LEFT HEART CATHETERIZATION WITH CORONARY ANGIOGRAM N/A 01/09/2013   Procedure: LEFT HEART CATHETERIZATION WITH CORONARY ANGIOGRAM;  Surgeon: Burnell Blanks, MD;  Location: [facility] CATH LAB;  Service: Cardiovascular;  Laterality: N/A;  . TEE WITHOUT CARDIOVERSION N/A 11/11/2016   Procedure: TRANSESOPHAGEAL ECHOCARDIOGRAM (TEE);  Surgeon: Fay Records, MD;  Location: [facility] ENDOSCOPY;  Service:  Cardiovascular;  Laterality: N/A;     Allergies  Allergies  Allergen Reactions  . Diphenhydramine Itching    Only with IV doses. Tolerates oral.  . Penicillins Other (See Comments)    Migraine Has patient had a PCN reaction causing immediate rash, facial/tongue/throat swelling, SOB or lightheadedness with hypotension: no Has patient had a PCN reaction causing severe rash involving mucus membranes or skin necrosis: No Has patient had a PCN reaction that required hospitalization No Has patient had a PCN reaction occurring within the last 10 years: No If all of the above answers are "NO", then may proceed with Cephalosporin use.  . Bupropion Other (See Comments)    Caused anxiety  . Adhesive [Tape] Rash    Please use paper tape  . Amoxicillin Rash    migraine  . Ativan [Lorazepam] Anxiety  . Other Rash    Please use paper tape    Inpatient Medications    . amiodarone  200 mg Oral BID  .  atorvastatin  20 mg Oral QHS  . calcium acetate  4,002 mg Oral TID WC  . citalopram  20 mg Oral QHS  . clopidogrel  75 mg Oral Daily  . docusate sodium  400 mg Oral BID  . famotidine  20 mg Oral Daily  . [START ON 12/08/2016] ferric gluconate (FERRLECIT/NULECIT) IV  125 mg Intravenous Q Wed-HD  . folic acid  1 mg Oral Daily  . gabapentin  300 mg Oral QHS  . guaiFENesin  600 mg Oral BID  . ipratropium-albuterol  3 mL Nebulization TID  . mouth rinse  15 mL Mouth Rinse BID  . midodrine  10 mg Oral Once per day on Mon Wed Fri  . multivitamin  1 tablet Oral QHS  . [START ON 12/07/2016] predniSONE  50 mg Oral Q breakfast  . warfarin  3.75 mg Oral q1800  . Warfarin - Pharmacist Dosing Inpatient   Does not apply q1800    Family History    Family History  Problem Relation Age of Onset  . Heart failure Mother     started in 45s  . Emphysema Mother     smoked  . Diabetes Father   . Lupus Sister   . Heart attack Maternal Grandfather   . Heart attack Maternal Uncle   . Heart attack Maternal  Aunt   . Hypertension Maternal Aunt   . Stroke Neg Hx      Social History    Social History   Social History  . Marital status: Married    Spouse name: N/A  . Number of children: N/A  . Years of education: N/A   Occupational History  . Disabled    Social History Main Topics  . Smoking status: Former Smoker    Packs/day: 2.00    Years: 20.00    Types: Cigarettes    Quit date: 08/06/1998  . Smokeless tobacco: Never Used  . Alcohol use No  . Drug use: No  . Sexual activity: Not on file   Other Topics Concern  . Not on file   Social History Narrative   No history of premature CAD in either parents or siblings. However, his maternal grandfather and 2 maternal uncles had coronary artery disease.     Review of Systems   General:  No chills, fever, night sweats or weight changes.  Cardiovascular:  No chest pain, edema, orthopnea, palpitations, He has chronic orthopnea Dermatological: No rash, lesions/masses Respiratory: No cough, dyspnea Urologic: No hematuria, dysuria Abdominal:   No nausea, vomiting, diarrhea, bright red blood per rectum, melena, or hematemesis Food gets stuck (especially rice or meat) while swallowing for about the last year. Neurologic:  No visual changes, wkns, changes in mental status. All other systems reviewed and are otherwise negative except as noted above.  Physical Exam   Blood pressure 95/73, pulse 86, temperature 98.4 F (36.9 C), temperature source Oral, resp. rate 18, height _0  (1.778 m), weight 177 lb 4 oz (80.4 kg), SpO2 (!) 89 %.  General: Pleasant, NAD, chronically ill appearing.  Psych: Normal affect. Neuro: Alert and oriented X 3. Moves all extremities spontaneously. HEENT: Normal  Neck: Supple without bruits or JVD. Lungs:  Resp regular and unlabored, CTA. Heart: RRR no s3, s4, or murmurs. Abdomen: Soft, non-tender, non-distended, BS + x 4.  Extremities: No clubbing, cyanosis or edema. Chronic skin changes in the  extremities. Diminished pulses. Left arm AV fistula.   Labs    Troponin [facility] Test)  Recent Labs  12/05/16 2149  TROPIPOC 0.11*    Recent Labs  12/06/16 0229 12/06/16 0940 12/06/16 1352  TROPONINI 0.16* 0.16* 0.11*   Lab Results  Component Value Date   WBC 14.1 (H) 12/06/2016   HGB 12.7 (L) 12/06/2016   HCT 39.5 12/06/2016   MCV 106.8 (H) 12/06/2016   PLT 96 (L) 12/06/2016    Recent Labs Lab 12/06/16 0940  NA 136  K 4.1  CL 96*  CO2 27  BUN 22*  CREATININE 3.89*  CALCIUM 8.5*  PROT 7.7  BILITOT 0.9  ALKPHOS 119  ALT 38  AST 22  GLUCOSE 103*   Lab Results  Component Value Date   CHOL 98 (L) 03/05/2016   HDL 32 (L) 03/05/2016   LDLCALC 37 03/05/2016   TRIG 143 03/05/2016   No results found for: [facility]   Radiology Studies    Dg Chest 2 View  Result Date: 12/05/2016 CLINICAL DATA:  Worsening dyspnea for 2 days. EXAM: CHEST  2 VIEW COMPARISON:  11/15/2016 FINDINGS: Extensive interstitial fluid or thickening. No confluent airspace consolidation. Small right effusion, extending into the fissures. Stable cardiomegaly.  Prior sternotomy and aortic valvuloplasty. IMPRESSION: Extensive interstitial fluid and small right pleural effusion. No focal airspace consolidation. Stable cardiomegaly. Electronically Signed   By: Andreas Newport M.D.   On: 12/05/2016 22:25   Dg Chest 2 View  Result Date: 11/15/2016 CLINICAL DATA:  Productive cough, shortness of breath for 3 days EXAM: CHEST  2 VIEW COMPARISON:  08/04/2016 FINDINGS: Bilateral diffuse interstitial thickening. Trace right pleural effusion. No pneumothorax. Stable cardiomegaly. Prior CABG. Prior aortic bowel replacement. No acute osseous abnormality. IMPRESSION: Cardiomegaly with mild pulmonary vascular congestion. Electronically Signed   By: Kathreen Devoid   On: 11/15/2016 16:50   Dg Chest Port 1 View  Result Date: 12/06/2016 CLINICAL DATA:  Follow-up pulmonary edema EXAM: PORTABLE CHEST 1 VIEW  COMPARISON:  PA and lateral chest x-ray of December 05, 2016 FINDINGS: The lungs are well-expanded. The interstitial markings remain prominent but are less conspicuous today. There is a small amount of fluid in the minor fissure. The cardiac silhouette remains enlarged. The pulmonary vascularity remains engorged. There is calcification in the wall of the aortic arch. The patient has undergone previous median sternotomy. Curvature of the lower thoracic spine is new since yesterday's study and likely reflects patient positioning. IMPRESSION: Mild improvement in pulmonary interstitial edema. No alveolar pneumonia. Stable cardiomegaly with central pulmonary vascular congestion. Thoracic aortic atherosclerosis. Electronically Signed   By: David  Martinique M.D.   On: 12/06/2016 15:30    EKG & Cardiac Imaging    EKG: Atrial flutter 113 bpm, RBBB (not present 11/15/16 when in SR), Diffuse TWI.  Echocardiogram:   TEE 11/11/2016 showed grossly normal LV function Aortic valve:  AV bioprosthesis is difficult to see No significant AI.  Mitral valve:  MV is thickened, calcified. There is restricted motion of the posterior leaflet. MR is directed posterior into LA and goest around back of left atrium. It is moderate to severe in intensity No backflow is seen in pulmonary veins.  Left atrium:   No evidence of thrombus in the atrial cavity or appendage.  Atrial septum:  Atrial septum bulges towards LA consistent with increased R sided pressures.  Right ventricle:  RV is dilated and RVEF is moderate to severely depressed.  Pulmonic valve:   PV is normal Trace PI.  Tricuspid valve:  TV is normal Mild TR.  Right atrium:  RA is enlarged  _______________________________  Echo 03/25/16  Study Conclusions  - Left ventricle: The cavity size was normal. Wall thickness was   increased in a pattern of mild LVH. Systolic function was normal.   The estimated ejection fraction was in the range of 50% to 55%.    Wall motion was normal; there were no regional wall motion   abnormalities. Features are consistent with a pseudonormal left   ventricular filling pattern, with concomitant abnormal relaxation   and increased filling pressure (grade 2 diastolic dysfunction).   Doppler parameters are consistent with high ventricular filling   pressure. - Aortic valve: A bioprosthesis was present. There was mild   regurgitation. - Mitral valve: Severely calcified annulus. There was moderate   regurgitation. - Left atrium: The atrium was severely dilated. - Right ventricle: The cavity size was mildly dilated. - Right atrium: The atrium was moderately dilated. - Pulmonary arteries: Systolic pressure was severely increased. PA   peak pressure: 72 mm Hg (S).  Impressions:  - Low normal LV systolic function; grade 2 diastolic dysfunction   with elevated LV filling pressure; biatrial enlargement; s/p AVR   with normal gradients and mild AI; severe MAC with moderate,   eccentric MR (may be underestimated; suggest TEE to further   assess if clinically indicated); mild RVE; mild TR with severely   elevated pulmonary pressure.  Assessment & Plan    Atrial flutter -DCCV on 3/8 converted to sinus rhythm, but now back in atrial flutter. Was in flutter with rate of 108 at the office on 3/30. Amiodarone was increased. Continued lopressor. -CHA2DS2/VAS Stroke Risk Points is at least 3 (CHF, CAD, HTN). He is anticoagulated with coumadin. INR is 1.98. Managed by pharmacy.  -Plan for DCCV which may hold better now that he has amiodarone in hi system.     CAD -He is s/p CABG and  PCI May 2016 at [facility] and most recently anterior STEMI after stopping Plavix due to occlusion of SVG to LAD. STEMI occurred when Plavix was stopped so he could be placed on the renal transplant list. Continue Plavix, beta blocker and statin. No ASA due to use of coumadin along with Plavix.  -No exertional chest pain. He has chronic DOE,  currently his shortness of breath is better after he had dialysis.   Chronic diastolic heart failure -Grade 2 DD. BNP 1624.4  (previous BNP levels have been 1,085 to 2505) -Wt. Stable -177 lbs, similar to weight in office.  -Fluid status managed with dialysis  S/P bioprosthetic AVR -Mild AI by TEE 11/2016  Mitral regurgitation -severe MAC with moderate, eccentric MR by echo   HTN -SBP running around 100, takes midodrine with dialysis.   ESRD -S/P failure renal transplant. Now on HD M,W,F. Managed by nephrology.  Swallowing difficulty -Food gets stuck -Advise GI consult  -Possible EGD with TEE on Wednesday.   Cameron Husky, NP-C 12/06/2016, 4:27 PM Pager: [phone]  As above, patient seen and examined. Briefly he is a 61 year old male with a past medical history of paroxysmal atrial fibrillation, coronary artery disease status post coronary artery bypass graft and PCI, prior aortic valve replacement, end-stage renal disease dialysis dependent, status post previous failed renal transplant, multiple sclerosis, chronic home O2 who we are asked to evaluate by Dr. Lorne Skeens for atrial flutter. Last echocardiogram was a TEE performed March 2018 which showed normal LV function, bioprosthetic aortic valve, moderate to severe mitral regurgitation, right ventricular enlargement with reduced RV function. Patient had successful cardioversion at that  time. He was seen by Dr. Angelena Form 3/30 and noted to have atrial flutter and amiodarone increased to 200 mg BID; coumadin continued. Patient developed worsening dyspnea and cough and was admitted 4/2 with acute respiratory failure and hyperkalemia. Patient's symptoms have improved with dialysis. Noted to be in atrial flutter versus atrial tachycardia and cardiology asked to evaluate. Patient does have some dysphagia associated with indigestion. No exertional chest pain.  Electrocardiogram shows probable atrial tachycardia versus slow atrial  flutter, right bundle branch block, anterior and inferior infarct. Troponin 0.16, 0.16 and 0.11. INR 1.9 and 1.98.  1 atrial tachycardia versus slow atrial flutter-this is likely contributing to his increasing CHF symptoms. His amiodarone was increased 3 days ago. Continue 200 mg twice a day for 2 weeks and then decrease to 200 mg daily. CHADSvasc 3. Continue coumadin. I think reestablishing sinus rhythm would help his symptoms. Given that his INR has been subtherapeutic he will need a TEE guided cardioversion. We will hopefully arrange for Wednesday of this week. Note his blood pressure will not allow additional AV nodal blocking agents. Patient also with history of atrial fibrillation.  2 dysphagia-patient has had increasing dysphagia for approximately one year and associated indigestion for the past several months. Would ask gastroenterology to evaluate as he may need EGD which could be performed prior to TEE.  3 end-stage renal disease-management per nephrology. His dry weight may need to be decreased if his blood pressure will allow.  4 coronary artery disease-continue Plavix and statin.  5 chronic hypertension-continue midodrine.  6 S/P AVR  7 COPD-per primary care.  8 MR-moderate to severe on most recent TEE; will need fu echos in the future but per Dr Camillia Herter last note, felt not to be a surgical candidate.  Cameron Ruths, MD

## 2016-12-06 NOTE — ED Notes (Signed)
Pt placed on a non-rebreather then  The resp therapist placed the pt back on a nasal cannula

## 2016-12-06 NOTE — ED Notes (Signed)
Pt sleeping. 

## 2016-12-06 NOTE — Procedures (Signed)
  I was present at this dialysis session, have reviewed the session itself and made  appropriate changes Kelly Splinter MD [place] pager [phone]   12/06/2016, 12:44 PM

## 2016-12-06 NOTE — H&P (Signed)
History and Physical    [name] LEX:[id number] DOB: 1956-01-31 DOA: 12/05/2016  PCP: Teressa Lower, MD  Patient coming from: Home.  Chief Complaint: Shortness of breath.  HPI: [name] is a 61 y.o. 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 Patient states since his discharge after being treated for an influenza patient has been having persistent shortness of breath. But last evening patient became acutely short of breath. Denies any chest pain. Has been having some nonproductive cough.  ED Course: In the ER chest x-ray shows features concerning for pulmonary edema. On call nephrologist Dr. Justin Mend has been consulted. Patient is being admitted for acute respiratory failure likely from acute pulmonary edema. Patient had followed up with his cardiologist 2 days ago and his amiodarone dose was increased to twice a day. Patient's heart rate is mildly elevated at around 120 bpm.  Review of Systems: As per HPI, rest all negative.   Past Medical History:  Diagnosis Date  . Acute blood loss anemia    a. 02/2016 due to groin hematoma. Rec 3 U PRBC.  Marland Kitchen Anemia   . Aortic heart valve prolapse 04/2013   a. s/p bioprosthetic AVR at time of CABG.  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 Bifascicular block   . CAD (coronary artery disease) 04/2013   a. 04/2013: s/p CABG x 2 (Y SVG -LAD & D2). b. 01/2015: NSTEMI s/p DES to LCx, BMS to SVG-LAD. c. NSTEMI 05/2015 during AF/AFL - non-flow limiting FFR. d. Low risk nuc 3/17. e. STEMI 6/17 after coming off Plavix, s/p DES to SVG-LAD into  native vessel.  . Carotid artery disease ([place])    a. 1-39% stenosis bilaterally in 06/2015.   Marland Kitchen Chronic respiratory failure ([place])   . 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 b. He had a renal Tx from 2008 to 2011, but subsequent rejection - Gets HD in [place], [place] on MWF schedule.    . Hematoma    a. Large right groin hematoma after cath 02/2016 with associated ABL anemia.  . Hypertension   . Multiple sclerosis ([place])   . Nocturnal hypoxemia   . On home oxygen therapy    pt states he has not been wearing it  . PAF (paroxysmal atrial fibrillation) ([place])    a. h/o, placed on amiodarone 07/2016 due to recurrence.  . Paroxysmal atrial flutter ([place])    a. During 05/2015 admission - SVT, atrial flutter, and PAF.  Marland Kitchen Peripheral vascular disease ([place])    Cath in 01/2015 required 45 cm Destination [place]  . Renal transplant failure and rejection   . 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 intolerant of bipap.  Marland Kitchen SVT (supraventricular tachycardia) ([place])   . Valvular heart disease    a. 2D Echo 03/25/16: mild LVH, EF 50-55%, grade 2 Dd, AVR present with mild AI, mod MR, severely dilated LA, mildly dilated RV, mod RAE, PASP 72.  Past Surgical History:  Procedure Laterality Date  . AV FISTULA PLACEMENT    . CARDIAC CATHETERIZATION N/A 05/26/2015   Procedure: Left Heart Cath and Coronary Angiography;  Surgeon: Leonie Man, MD;  Location: [facility] LAB;  Service: Cardiovascular;  Laterality: N/A;  . CARDIAC CATHETERIZATION N/A 05/26/2015   Procedure: Intravascular Pressure Wire/FFR Study;  Surgeon: Leonie Man, MD;  Location: [facility] CV LAB;  Service: Cardiovascular;  Laterality: N/A;  . CARDIAC CATHETERIZATION N/A 02/15/2016   Procedure: Left Heart Cath and Coronary Angiography;  Surgeon: Wellington Hampshire, MD;  Location: [facility] LAB;  Service: Cardiovascular;  Laterality: N/A;  . CARDIOVERSION N/A  07/12/2016   Procedure: CARDIOVERSION;  Surgeon: Minus Breeding, MD;  Location: [facility] ENDOSCOPY;  Service: Cardiovascular;  Laterality: N/A;  . CARDIOVERSION N/A 11/11/2016   Procedure: CARDIOVERSION;  Surgeon: Fay Records, MD;  Location: [place];  Service: Cardiovascular;  Laterality: N/A;  . CORONARY ARTERY BYPASS GRAFT    . excised squamous cells at rectum  2006  . flash     flash pulmonary edema  . HERNIA REPAIR  07/2011  . KIDNEY TRANSPLANT  08/2007  . LEFT HEART CATHETERIZATION WITH CORONARY ANGIOGRAM N/A 01/09/2013   Procedure: LEFT HEART CATHETERIZATION WITH CORONARY ANGIOGRAM;  Surgeon: Burnell Blanks, MD;  Location: [facility] CATH LAB;  Service: Cardiovascular;  Laterality: N/A;  . TEE WITHOUT CARDIOVERSION N/A 11/11/2016   Procedure: TRANSESOPHAGEAL ECHOCARDIOGRAM (TEE);  Surgeon: Fay Records, MD;  Location: [facility] ENDOSCOPY;  Service: Cardiovascular;  Laterality: N/A;     reports that he quit smoking about 18 years ago. His smoking use included Cigarettes. He has a 40.00 pack-year smoking history. He has never used smokeless tobacco. He reports that he does not drink alcohol or use drugs.  Allergies  Allergen Reactions  . Diphenhydramine Itching    Only with IV doses. Tolerates oral.  . Penicillins Other (See Comments)    Migraine Has patient had a PCN reaction causing immediate rash, facial/tongue/throat swelling, SOB or lightheadedness with hypotension: no Has patient had a PCN reaction causing severe rash involving mucus membranes or skin necrosis: No Has patient had a PCN reaction that required hospitalization No Has patient had a PCN reaction occurring within the last 10 years: No If all of the above answers are "NO", then may proceed with Cephalosporin use.  . Bupropion Other (See Comments)    Caused anxiety  . Adhesive [Tape] Rash    Please use paper tape  . Amoxicillin Rash    migraine  . Ativan [Lorazepam] Anxiety  . Other Rash    Please use paper tape    Family  History  Problem Relation Age of Onset  . Heart failure Mother     started in 54s  . Emphysema Mother     smoked  . Diabetes Father   . Lupus Sister   . Heart attack Maternal Grandfather   . Heart attack Maternal Uncle   . Heart attack Maternal Aunt   . Hypertension Maternal Aunt   . Stroke Neg Hx     Prior to Admission medications   Medication Sig Start Date End Date Taking? Authorizing Provider  acetaminophen (TYLENOL) 500 MG tablet Take 1,000 mg by mouth every 6 (six) hours as needed for pain or fever.   Yes Historical Provider, MD  albuterol (PROAIR HFA) 108 (90 BASE) MCG/ACT inhaler Inhale 1-2 puffs into the lungs every 6 (six) hours as needed for shortness of breath.  07/19/11  Yes Historical Provider, MD  amiodarone (PACERONE) 200 MG tablet Take 1 tablet (200 mg total) by mouth 2 (two) times daily. 12/03/16  Yes Burnell Blanks, MD  atorvastatin (LIPITOR) 40 MG tablet Take 20 mg by mouth at bedtime.    Yes Historical Provider, MD  calcium acetate (PHOSLO) 667 MG capsule Take 4,002 mg by mouth 3 (three) times daily with meals.    Yes Historical Provider, MD  citalopram (CELEXA) 20 MG tablet Take 20 mg by mouth at bedtime.    Yes Historical Provider, MD  clopidogrel (PLAVIX) 75 MG tablet Take 1 tablet (75 mg total) by mouth daily. 02/19/16  Yes Dayna N Dunn, PA-C  docusate sodium (COLACE) 100 MG capsule Take 400 mg by mouth 2 (two) times daily.    Yes Historical Provider, MD  folic acid (FOLVITE) 1 MG tablet Take 1 mg by mouth daily.    Yes Historical Provider, MD  gabapentin (NEURONTIN) 300 MG capsule Take 300 mg by mouth at bedtime.    Yes Historical Provider, MD  guaiFENesin (MUCINEX) 600 MG 12 hr tablet Take 1 tablet (600 mg total) by mouth 2 (two) times daily. 11/18/16  Yes Lavina Hamman, MD  ipratropium-albuterol (DUONEB) 0.5-2.5 (3) MG/3ML SOLN Take 3 mLs by nebulization every 4 (four) hours as needed. Patient taking differently: Take 3 mLs by nebulization 3 (three)  times daily. scheduled 11/18/16  Yes Lavina Hamman, MD  midodrine (PROAMATINE) 10 MG tablet Take 1 tablet (10 mg total) by mouth every Monday, Wednesday, and Friday with hemodialysis. Patient taking differently: Take 10 mg by mouth See admin instructions. Take 1 tablet (10 mg) by mouth on Monday, Wednesday, Friday 1 1/2 hours before dialysis 07/14/16  Yes [name], MD  multivitamin (RENA-VIT) TABS tablet Take 1 tablet by mouth daily. 01/11/13  Yes Samella Parr, NP  nitroGLYCERIN (NITROSTAT) 0.4 MG SL tablet Place 0.4 mg under the tongue every 5 (five) minutes as needed for chest pain (max 3 doses - if no relief call MD).    Yes Historical Provider, MD  OXYGEN Inhale 4 L into the lungs continuous.   Yes Historical Provider, MD  polyethylene glycol (MIRALAX / GLYCOLAX) packet Take 17 g by mouth at bedtime. Mix in 8 oz liquid and drink   Yes Historical Provider, MD  ranitidine (ZANTAC) 150 MG tablet Take 150 mg by mouth daily.    Yes Historical Provider, MD  sodium polystyrene (KAYEXALATE) 15 GM/60ML suspension Take 15 g by mouth See admin instructions. Take 60 ml (15 g) by mouth in the morning on non-dialysis days - Sunday, Tuesday, Thursday, Saturday   Yes Historical Provider, MD  warfarin (COUMADIN) 7.5 MG tablet Take 1/2-1 tablet by mouth daily as directed by coumadin clinic Patient taking differently: take 1/2 tablet by mouth daily at bedtime or as directed by coumadin clinic 10/13/16  Yes Sueanne Margarita, MD    Physical Exam: Vitals:   12/06/16 0115 12/06/16 0130 12/06/16 0200 12/06/16 0215  BP: (!) 105/93 (!) 117/92 102/73 112/88  Pulse: (!) 119 (!) 58 (!) 102 (!) 109  Resp: 18 (!) 22 (!) 22 (!) 25  Temp:      TempSrc:      SpO2: 100% 97% 98% 99%      Constitutional: Moderately built and nourished. Vitals:   12/06/16 0115 12/06/16 0130 12/06/16 0200 12/06/16 0215  BP: (!) 105/93 (!) 117/92 102/73 112/88  Pulse: (!) 119 (!) 58 (!) 102 (!) 109  Resp: 18 (!)  22 (!) 22 (!) 25  Temp:       TempSrc:      SpO2: 100% 97% 98% 99%   Eyes: Anicteric no pallor. ENMT: No discharge from the ears eyes nose and mouth. Neck: No JVD appreciated no mass felt. Respiratory: No rhonchi or crepitations. Cardiovascular: S1 and S2 heard no murmurs appreciated. Abdomen: Soft nontender bowel sounds present. Musculoskeletal: No edema. No joint effusion. Skin: No rash. Neurologic: Alert awake oriented to time place and person. Moves all extremities. Psychiatric: Appears normal. Normal affect.   Labs on Admission: I have personally reviewed following labs and imaging studies  CBC:  Recent Labs Lab 12/05/16 2134 12/05/16 2152  WBC 9.4  --   HGB 12.9* 13.6  HCT 40.2 40.0  MCV 106.3*  --   PLT 95*  --    Basic Metabolic Panel:  Recent Labs Lab 12/05/16 2134 12/05/16 2152 12/05/16 2257  NA 139 137  --   K 6.1* 5.9*  --   CL 96* 101  --   CO2 25  --   --   GLUCOSE 120* 120*  --   BUN 73* 67*  --   CREATININE 8.90* 9.50*  --   CALCIUM 7.8*  --   --   MG  --   --  2.3   GFR: Estimated Creatinine Clearance: 8.4 mL/min (A) (by C-G formula based on SCr of 9.5 mg/dL (H)). Liver Function Tests:  Recent Labs Lab 12/05/16 2257  AST 20  ALT 37  ALKPHOS 121  BILITOT 1.1  PROT 6.9  ALBUMIN 3.2*    Recent Labs Lab 12/05/16 2257  LIPASE 39   No results for input(s): AMMONIA in the last 168 hours. Coagulation Profile:  Recent Labs Lab 11/29/16 1504 12/05/16 2257  INR 1.9 1.98   Cardiac Enzymes: No results for input(s): CKTOTAL, CKMB, CKMBINDEX, TROPONINI in the last 168 hours. BNP (last 3 results) No results for input(s): PROBNP in the last 8760 hours. HbA1C: No results for input(s): HGBA1C in the last 72 hours. CBG: No results for input(s): GLUCAP in the last 168 hours. Lipid Profile: No results for input(s): CHOL, HDL, LDLCALC, TRIG, CHOLHDL, LDLDIRECT in the last 72 hours. Thyroid Function Tests: No results for input(s): TSH, T4TOTAL, FREET4, T3FREE,  THYROIDAB in the last 72 hours. Anemia Panel: No results for input(s): VITAMINB12, FOLATE, FERRITIN, TIBC, IRON, RETICCTPCT in the last 72 hours. Urine analysis: No results found for: COLORURINE, APPEARANCEUR, LABSPEC, PHURINE, GLUCOSEU, HGBUR, BILIRUBINUR, KETONESUR, PROTEINUR, UROBILINOGEN, NITRITE, LEUKOCYTESUR Sepsis Labs: '@LABRCNTIP'$ (procalcitonin:4,lacticidven:4) )No results found for this or any previous visit (from the past 240 hour(s)).   Radiological Exams on Admission: Dg Chest 2 View  Result Date: 12/05/2016 CLINICAL DATA:  Worsening dyspnea for 2 days. EXAM: CHEST  2 VIEW COMPARISON:  11/15/2016 FINDINGS: Extensive interstitial fluid or thickening. No confluent airspace consolidation. Small right effusion, extending into the fissures. Stable cardiomegaly.  Prior sternotomy and aortic valvuloplasty. IMPRESSION: Extensive interstitial fluid and small right pleural effusion. No focal airspace consolidation. Stable cardiomegaly. Electronically Signed   By: Andreas Newport M.D.   On: 12/05/2016 22:25    EKG: Independently reviewed. Possible sinus tachycardia versus atrial flutter. Rate around 120 bpm. BCT.  Assessment/Plan Principal Problem:   Acute pulmonary edema (HCC) Active Problems:   Hypotension   Multiple sclerosis (HCC)   OSA -not compliant with C-pap   ESRD (end stage renal disease) on dialysis ([place])   Typical atrial flutter ([place])   History of tissue AVR 2014  Hx of CABG x 2 2014    1. Acute respiratory failure with hypoxia most likely secondary to acute pulmonary edema due to fluid overload - I have discussed with on-call nephrologist Dr. Justin Mend. Patient is being arranged for urgent dialysis. If patient's symptoms does not improve with dialysis then we should consider other differentials including amiodarone-induced lung toxicity and other infectious causes. 2. Atrial flutter/fib - rate mildly elevated. Closely observe. Patient is on Coumadin. 3. History of CAD -  denies any chest pain. On Plavix and statins. 4. History of bioprosthetic aortic valve replacement.   DVT prophylaxis: Heparin. Code Status: Full code.  Family Communication: Patient's wife.  Disposition Plan: Home.  Consults called: Nephrology.  Admission status: Inpatient.    Rise Patience MD [facility] Pager [phone].  If 7PM-7AM, please contact night-coverage [email or web address] Password [facility]  12/06/2016, 2:37 AM

## 2016-12-06 NOTE — ED Notes (Signed)
2nd iv not needed at present cancelled

## 2016-12-06 NOTE — ED Notes (Signed)
Pt sats staying up on the nonrebreather

## 2016-12-06 NOTE — Progress Notes (Signed)
PROGRESS NOTE    [name]  WPY:[id number] DOB: Feb 20, 1956 DOA: 12/05/2016 PCP: Teressa Lower, MD     Brief Narrative:  [name] is a 61 y.o. male with history of CAD s/p 2V CABG in 2014 (Y SVG to LAD &D2), NSTEMI 01/2015 s/p DES to LCx and BMS to SVG-LAD at 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 Patient states since his discharge after being treated for an influenza patient has been having persistent shortness of breath. But last evening patient became acutely short of breath. Denies any chest pain. Has been having some nonproductive cough. In the ER chest x-ray shows features concerning for pulmonary edema. On call nephrologist Dr. Justin Mend has been consulted. Patient was admitted for acute respiratory failure likely from acute pulmonary edema.   Assessment & Plan:   Principal Problem:   Acute pulmonary edema (HCC) Active Problems:   Hypotension   Multiple sclerosis (HCC)   OSA -not compliant with C-pap   ESRD (end stage renal disease) on dialysis ([place])   Typical atrial flutter ([place])   History of tissue AVR 2014   Hx of CABG x 2 2014  Acute on chronic hypoxemic respiratory failure -Requires 2L [place] O2  at nighttime at baseline due to OSA intolerant of CPAP. Most recently since influenza dx 3 weeks ago, has been on [place] O2 daily   -Suspect combination of fluid overload, COPD, ?amiodarone toxicity, A fib/flutter RVR (rate 120s)  Acute exacerbation COPD  -Follows with Dr. Melvyn Novas -Duoneb  -Prednisone   ESRD on HD  -Per Nephrology   Atrial flutter/fib  -S/p DCCV 3/8 but now back in flutter/fib -Continue coumadin  -Amiodarone recently increased to '200mg'$  BID on 3/30 -Looks like metoprolol  discontinued (10/27/16 A fib office note) due to hypotension in HD. Per Dr. Angelena Form (note 3/30), he states to continue lopressor but it's not on current med list -Will consult cardiology   Chronic hypotension -Midodrine with HD   CAD s/p CABG -Denies any chest pain. On Plavix and statins  Multiple sclerosis -Stable  -PT to eval  Depression -Continue celexa    DVT prophylaxis: coumadin Code Status: full Family Communication: no family at bedside Disposition Plan: pending improvement    Consultants:   Nephrology  Cardiology  Procedures:   None  Antimicrobials:   None    Subjective: Patient states that over the past 3 weeks, he has been more short of breath. He denies any chest pain, has some dry cough, produces white sputum. He underwent cardioversion last month, has been back in atrial fibrillation/flutter.  Objective: Vitals:   12/06/16 0930 12/06/16 1000 12/06/16 1135 12/06/16 1425  BP: 97/75 108/60 106/85   Pulse: (!) 124 (!) 127 (!) 120   Resp: (!) 21 (!) 21 16   Temp:  97.4 F (36.3 C) 98.5 F (36.9 C)   TempSrc:  Oral Oral   SpO2: 93% 94% 98% 94%  Weight:  79.2 kg (174 lb 9.7 oz) 80.4 kg (177 lb 4 oz)   Height:   '5\' 10"'$  (1.778 m)     Intake/Output Summary (Last 24 hours) at 12/06/16 1553 Last data filed at 12/06/16 1000  Gross per 24 hour  Intake  0 ml  Output             3800 ml  Net            -3800 ml   Filed Weights   12/06/16 1000 12/06/16 1135  Weight: 79.2 kg (174 lb 9.7 oz) 80.4 kg (177 lb 4 oz)    Examination:  General exam: Appears calm and comfortable  Respiratory system: Expiratory wheezes bilaterally, Respiratory effort normal. On nasal cannula oxygen Cardiovascular system: S1 & S2 heard, irregular rhythm, tachycardic rate 120s to 130s. No JVD, murmurs, rubs, gallops or clicks. Trace pedal edema. Gastrointestinal system: Abdomen is nondistended, soft and nontender. No organomegaly or masses felt. Normal bowel  sounds heard. Central nervous system: Alert and oriented. No focal neurological deficits. Extremities: Symmetric 5 x 5 power. Skin: No rashes, lesions or ulcers Psychiatry: Judgement and insight appear normal. Mood & affect appropriate.   Data Reviewed: I have personally reviewed following labs and imaging studies  CBC:  Recent Labs Lab 12/05/16 2134 12/05/16 2152 12/06/16 0940  WBC 9.4  --  14.1*  HGB 12.9* 13.6 12.7*  HCT 40.2 40.0 39.5  MCV 106.3*  --  106.8*  PLT 95*  --  96*   Basic Metabolic Panel:  Recent Labs Lab 12/05/16 2134 12/05/16 2152 12/05/16 2257 12/06/16 0940  NA 139 137  --  136  K 6.1* 5.9*  --  4.1  CL 96* 101  --  96*  CO2 25  --   --  27  GLUCOSE 120* 120*  --  103*  BUN 73* 67*  --  22*  CREATININE 8.90* 9.50*  --  3.89*  CALCIUM 7.8*  --   --  8.5*  MG  --   --  2.3  --    GFR: Estimated Creatinine Clearance: 20.6 mL/min (A) (by C-G formula based on SCr of 3.89 mg/dL (H)). Liver Function Tests:  Recent Labs Lab 12/05/16 2257 12/06/16 0940  AST 20 22  ALT 37 38  ALKPHOS 121 119  BILITOT 1.1 0.9  PROT 6.9 7.7  ALBUMIN 3.2* 3.5    Recent Labs Lab 12/05/16 2257  LIPASE 39   No results for input(s): AMMONIA in the last 168 hours. Coagulation Profile:  Recent Labs Lab 12/05/16 2257  INR 1.98   Cardiac Enzymes:  Recent Labs Lab 12/06/16 0229 12/06/16 0940 12/06/16 1352  TROPONINI 0.16* 0.16* 0.11*   BNP (last 3 results) No results for input(s): PROBNP in the last 8760 hours. HbA1C: No results for input(s): HGBA1C in the last 72 hours. CBG: No results for input(s): GLUCAP in the last 168 hours. Lipid Profile: No results for input(s): CHOL, HDL, LDLCALC, TRIG, CHOLHDL, LDLDIRECT in the last 72 hours. Thyroid Function Tests: No results for input(s): TSH, T4TOTAL, FREET4, T3FREE, THYROIDAB in the last 72 hours. Anemia Panel: No results for input(s): VITAMINB12, FOLATE, FERRITIN, TIBC, IRON, RETICCTPCT in the last 72  hours. Sepsis Labs: No results for input(s): PROCALCITON, LATICACIDVEN in the last 168 hours.  No results found for this or any previous visit (from the past 240 hour(s)).     Radiology Studies: Dg Chest 2 View  Result Date: 12/05/2016 CLINICAL DATA:  Worsening dyspnea for 2 days. EXAM: CHEST  2 VIEW COMPARISON:  11/15/2016 FINDINGS: Extensive interstitial fluid or thickening. No confluent airspace consolidation. Small right effusion, extending into the fissures. Stable cardiomegaly.  Prior sternotomy and aortic valvuloplasty. IMPRESSION: Extensive interstitial fluid and small right pleural effusion. No focal airspace consolidation.  Stable cardiomegaly. Electronically Signed   By: Andreas Newport M.D.   On: 12/05/2016 22:25   Dg Chest Port 1 View  Result Date: 12/06/2016 CLINICAL DATA:  Follow-up pulmonary edema EXAM: PORTABLE CHEST 1 VIEW COMPARISON:  PA and lateral chest x-ray of December 05, 2016 FINDINGS: The lungs are well-expanded. The interstitial markings remain prominent but are less conspicuous today. There is a small amount of fluid in the minor fissure. The cardiac silhouette remains enlarged. The pulmonary vascularity remains engorged. There is calcification in the wall of the aortic arch. The patient has undergone previous median sternotomy. Curvature of the lower thoracic spine is new since yesterday's study and likely reflects patient positioning. IMPRESSION: Mild improvement in pulmonary interstitial edema. No alveolar pneumonia. Stable cardiomegaly with central pulmonary vascular congestion. Thoracic aortic atherosclerosis. Electronically Signed   By: David  Martinique M.D.   On: 12/06/2016 15:30      Scheduled Meds: . amiodarone  200 mg Oral BID  . atorvastatin  20 mg Oral QHS  . calcium acetate  4,002 mg Oral TID WC  . citalopram  20 mg Oral QHS  . clopidogrel  75 mg Oral Daily  . docusate sodium  400 mg Oral BID  . famotidine  20 mg Oral Daily  . [START ON 12/08/2016] ferric  gluconate (FERRLECIT/NULECIT) IV  125 mg Intravenous Q Wed-HD  . folic acid  1 mg Oral Daily  . gabapentin  300 mg Oral QHS  . guaiFENesin  600 mg Oral BID  . ipratropium-albuterol  3 mL Nebulization TID  . mouth rinse  15 mL Mouth Rinse BID  . midodrine  10 mg Oral Once per day on Mon Wed Fri  . multivitamin  1 tablet Oral QHS  . [START ON 12/07/2016] predniSONE  50 mg Oral Q breakfast  . warfarin  3.75 mg Oral q1800  . Warfarin - Pharmacist Dosing Inpatient   Does not apply q1800   Continuous Infusions:   LOS: 0 days    Time spent: 50 minutes   Dessa Phi, DO [facility] [email or web address] Password [facility] 12/06/2016, 3:53 PM

## 2016-12-06 NOTE — ED Notes (Signed)
Pt placed back on non-rebreather due to sats 86% again

## 2016-12-06 NOTE — ED Notes (Signed)
Nurse drawing labs. 

## 2016-12-06 NOTE — Telephone Encounter (Signed)
I called pts spouse to make appt and was informed that the pt is now admitted to the hospital with fluid on his lungs.  She is unsure of discharge date as of now.

## 2016-12-06 NOTE — ED Notes (Signed)
To dialysis

## 2016-12-06 NOTE — ED Notes (Signed)
The pt sats were 82-84%  Pt placed on a non-rebreather  sats 97%

## 2016-12-06 NOTE — Progress Notes (Signed)
Report given to [facility], RN to complete HD treatment.

## 2016-12-06 NOTE — Progress Notes (Signed)
Patient arrived to unit per ED stretcher.  Reviewed treatment plan and this RN agrees.  Report received from bedside RN, Altha Harm.  Consent obtained.  Patient A & o X 4. Lung sounds diminished, coarse with expiratory to ausculation in all fields. BLE +1 pitting edema. Cardiac: ST.  Prepped LLAVF with alcohol and cannulated with two 15 gauge needles.  Pulsation of blood noted.  Flushed access well with saline per protocol.  Connected and secured lines and initiated tx at 0542.  UF goal of 4000 mL and net fluid removal of 3500 mL.  Will continue to monitor.

## 2016-12-07 ENCOUNTER — Inpatient Hospital Stay (HOSPITAL_COMMUNITY): Payer: Medicare Other

## 2016-12-07 ENCOUNTER — Encounter (HOSPITAL_COMMUNITY): Payer: Self-pay | Admitting: Physician Assistant

## 2016-12-07 DIAGNOSIS — G35 Multiple sclerosis: Secondary | ICD-10-CM

## 2016-12-07 DIAGNOSIS — R1310 Dysphagia, unspecified: Secondary | ICD-10-CM

## 2016-12-07 DIAGNOSIS — I4892 Unspecified atrial flutter: Secondary | ICD-10-CM

## 2016-12-07 LAB — CBC
HEMATOCRIT: 39.4 % (ref 39.0–52.0)
HEMOGLOBIN: 12.7 g/dL — AB (ref 13.0–17.0)
MCH: 34.4 pg — ABNORMAL HIGH (ref 26.0–34.0)
MCHC: 32.2 g/dL (ref 30.0–36.0)
MCV: 106.8 fL — ABNORMAL HIGH (ref 78.0–100.0)
Platelets: 98 10*3/uL — ABNORMAL LOW (ref 150–400)
RBC: 3.69 MIL/uL — AB (ref 4.22–5.81)
RDW: 16.4 % — ABNORMAL HIGH (ref 11.5–15.5)
WBC: 9.5 10*3/uL (ref 4.0–10.5)

## 2016-12-07 LAB — BASIC METABOLIC PANEL
ANION GAP: 14 (ref 5–15)
BUN: 48 mg/dL — ABNORMAL HIGH (ref 6–20)
CALCIUM: 8.5 mg/dL — AB (ref 8.9–10.3)
CHLORIDE: 95 mmol/L — AB (ref 101–111)
CO2: 26 mmol/L (ref 22–32)
Creatinine, Ser: 6.91 mg/dL — ABNORMAL HIGH (ref 0.61–1.24)
GFR calc non Af Amer: 8 mL/min — ABNORMAL LOW (ref >60)
GFR, EST AFRICAN AMERICAN: 9 mL/min — AB (ref >60)
Glucose, Bld: 96 mg/dL (ref 65–99)
POTASSIUM: 5.1 mmol/L (ref 3.5–5.1)
Sodium: 135 mmol/L (ref 135–145)

## 2016-12-07 LAB — HEPARIN LEVEL (UNFRACTIONATED): Heparin Unfractionated: <0.1 IU/mL — ABNORMAL LOW (ref 0.30–0.70)

## 2016-12-07 LAB — PROTIME-INR
INR: 1.95
Prothrombin Time: 22.5 seconds — ABNORMAL HIGH (ref 11.4–15.2)

## 2016-12-07 LAB — MAGNESIUM: Magnesium: 2.1 mg/dL (ref 1.7–2.4)

## 2016-12-07 MED ORDER — LEVALBUTEROL HCL 0.63 MG/3ML IN NEBU
0.6300 mg | INHALATION_SOLUTION | Freq: Four times a day (QID) | RESPIRATORY_TRACT | Status: DC | PRN
Start: 2016-12-07 — End: 2016-12-09

## 2016-12-07 MED ORDER — PANTOPRAZOLE SODIUM 40 MG PO TBEC
40.0000 mg | DELAYED_RELEASE_TABLET | Freq: Every day | ORAL | Status: DC
  Administered 2016-12-07 – 2016-12-09 (×3): 40 mg via ORAL
  Filled 2016-12-07 (×3): qty 1

## 2016-12-07 MED ORDER — DOXYCYCLINE HYCLATE 100 MG PO TABS
100.0000 mg | ORAL_TABLET | Freq: Two times a day (BID) | ORAL | Status: DC
  Administered 2016-12-07 – 2016-12-09 (×4): 100 mg via ORAL
  Filled 2016-12-07 (×5): qty 1

## 2016-12-07 MED ORDER — HEPARIN (PORCINE) IN NACL 100-0.45 UNIT/ML-% IJ SOLN
1500.0000 [IU]/h | INTRAMUSCULAR | Status: DC
  Administered 2016-12-07: 1150 [IU]/h via INTRAVENOUS
  Administered 2016-12-08 – 2016-12-09 (×2): 1500 [IU]/h via INTRAVENOUS
  Filled 2016-12-07 (×3): qty 250

## 2016-12-07 MED ORDER — MOMETASONE FURO-FORMOTEROL FUM 100-5 MCG/ACT IN AERO
2.0000 | INHALATION_SPRAY | Freq: Two times a day (BID) | RESPIRATORY_TRACT | Status: DC
  Administered 2016-12-07 – 2016-12-08 (×3): 2 via RESPIRATORY_TRACT
  Filled 2016-12-07: qty 8.8

## 2016-12-07 MED ORDER — WARFARIN SODIUM 5 MG PO TABS
5.0000 mg | ORAL_TABLET | Freq: Once | ORAL | Status: AC
  Administered 2016-12-07: 5 mg via ORAL
  Filled 2016-12-07: qty 1

## 2016-12-07 MED ORDER — WARFARIN SODIUM 7.5 MG PO TABS
3.7500 mg | ORAL_TABLET | Freq: Every day | ORAL | Status: DC
  Administered 2016-12-08: 3.75 mg via ORAL
  Filled 2016-12-07: qty 0.5

## 2016-12-07 NOTE — Progress Notes (Addendum)
Progress Note  Patient Name: [name] Date of Encounter: 12/07/2016  Primary Cardiologist: Dr Angelena Form  Patient Profile     61 y.o. 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 Pt admitted 04/02 w/ SOB, cards saw for a flutter, amio increased for this 03/30 office visit. CHA2DS2/VAS Stroke Risk Points is least 3 (CHF, CAD, HTN). On coumadin.  Subjective   SOB and wheezing today. No CP, felt some palpitations last pm, not today.  Inpatient Medications    Scheduled Meds: . amiodarone  200 mg Oral BID  . atorvastatin  20 mg Oral QHS  . calcium acetate  4,002 mg Oral TID WC  . citalopram  20 mg Oral QHS  . clopidogrel  75 mg Oral Daily  . docusate sodium  400 mg Oral BID  . famotidine  20 mg Oral Daily  . [START ON 12/08/2016] ferric gluconate (FERRLECIT/NULECIT) IV  125 mg Intravenous Q Wed-HD  . folic acid  1 mg Oral Daily  . gabapentin  300 mg Oral QHS  . guaiFENesin  600 mg Oral BID  . ipratropium-albuterol  3 mL Nebulization TID  . mouth rinse  15 mL Mouth Rinse BID  . midodrine  10 mg Oral Once per day on Mon Wed Fri  . multivitamin  1 tablet Oral QHS  . predniSONE  50 mg Oral Q breakfast  . warfarin  3.75 mg Oral q1800  . Warfarin - Pharmacist Dosing Inpatient   Does not apply q1800   Continuous Infusions:  PRN Meds: acetaminophen **OR** acetaminophen, nitroGLYCERIN, ondansetron **OR** ondansetron (ZOFRAN) IV   Vital Signs    Vitals:   12/06/16 2102 12/06/16 2313 12/07/16 0231 12/07/16 0530  BP:  98/74 92/74 91/73   Pulse:  (!) 125 (!) 124 (!) 116  Resp:  18 18 18   Temp:  99  F (37.2 C) 97.9 F (36.6 C) 98.1 F (36.7 C)  TempSrc:  Oral    SpO2: 91% 96% 100% 96%  Weight:    174 lb 11.2 oz (79.2 kg)  Height:        Intake/Output Summary (Last 24 hours) at 12/07/16 0956 Last data filed at 12/06/16 1000  Gross per 24 hour  Intake                0 ml  Output             3800 ml  Net            -3800 ml   Filed Weights   12/06/16 1000 12/06/16 1135 12/07/16 0530  Weight: 174 lb 9.7 oz (79.2 kg) 177 lb 4 oz (80.4 kg) 174 lb 11.2 oz (79.2 kg)    Telemetry    Atrial flutter w/ RVR, RBBB - Personally Reviewed  ECG    Atrial flutter, RVR, RBBB - Personally Reviewed  Physical Exam   General: Well developed, well nourished, male appearing in no acute distress. Head: Normocephalic, atraumatic.  Neck: Supple without bruits, JVD 9 cm. Lungs:  Resp regular and unlabored, insp and exp wheeze, some rales and occ rhonchi. Heart: Irreg R&R,  S1, S2, no S3, S4, + murmur; no rub. Abdomen: Soft, non-tender, non-distended with normoactive bowel sounds. No hepatomegaly. No rebound/guarding. No obvious abdominal masses. Extremities: No clubbing, cyanosis, no edema. Distal pedal pulses are 2+ bilaterally. Neuro: Alert and oriented X 3. Moves all extremities spontaneously. Psych: Normal affect.  Labs    Hematology Recent Labs Lab 12/05/16 2134 12/05/16 2152 12/06/16 0940 12/07/16 0558  WBC 9.4  --  14.1* 9.5  RBC 3.78*  --  3.70* 3.69*  HGB 12.9* 13.6 12.7* 12.7*  HCT 40.2 40.0 39.5 39.4  MCV 106.3*  --  106.8* 106.8*  MCH 34.1*  --  34.3* 34.4*  MCHC 32.1  --  32.2 32.2  RDW 16.5*  --  16.1* 16.4*  PLT 95*  --  96* 98*    Chemistry Recent Labs Lab 12/05/16 2134 12/05/16 2152 12/05/16 2257 12/06/16 0940 12/07/16 0558  NA 139 137  --  136 135  K 6.1* 5.9*  --  4.1 5.1  CL 96* 101  --  96* 95*  CO2 25  --   --  27 26  GLUCOSE 120* 120*  --  103* 96  BUN 73* 67*  --  22* 48*  CREATININE 8.90* 9.50*  --  3.89* 6.91*  CALCIUM 7.8*  --   --  8.5*  8.5*  PROT  --   --  6.9 7.7  --   ALBUMIN  --   --  3.2* 3.5  --   AST  --   --  20 22  --   ALT  --   --  37 38  --   ALKPHOS  --   --  121 119  --   BILITOT  --   --  1.1 0.9  --   GFRNONAA 6*  --   --  15* 8*  GFRAA 7*  --   --  18* 9*  ANIONGAP 18*  --   --  13 14     Cardiac Enzymes Recent Labs Lab 12/06/16 0229 12/06/16 0940 12/06/16 1352  TROPONINI 0.16* 0.16* 0.11*    Recent Labs Lab 12/05/16 2149  TROPIPOC 0.11*     BNP Recent Labs Lab 12/05/16 2257  BNP 1,624.4*    Lab Results  Component Value Date   INR 1.95 12/07/2016   INR 1.98 12/05/2016   INR 1.9 11/29/2016     Radiology    Dg Chest 2 View Result Date: 12/05/2016 CLINICAL DATA:  Worsening dyspnea for 2 days. EXAM: CHEST  2 VIEW COMPARISON:  11/15/2016 FINDINGS: Extensive interstitial fluid or thickening. No confluent airspace consolidation. Small right effusion, extending into the fissures. Stable cardiomegaly.  Prior sternotomy and aortic valvuloplasty. IMPRESSION: Extensive interstitial fluid and small right pleural effusion. No focal airspace consolidation. Stable cardiomegaly. Electronically Signed   By: Andreas Newport M.D.   On: 12/05/2016 22:25   Dg Chest Port 1 View Result Date: 12/06/2016 CLINICAL DATA:  Follow-up pulmonary edema EXAM: PORTABLE CHEST 1 VIEW COMPARISON:  PA and lateral chest x-ray of December 05, 2016 FINDINGS: The lungs are well-expanded. The interstitial markings remain prominent but are less conspicuous today. There is a small amount of fluid in the minor fissure. The cardiac silhouette remains enlarged. The pulmonary vascularity remains engorged. There is calcification in the wall of the aortic arch. The patient has undergone previous median sternotomy. Curvature of the lower thoracic spine is new since yesterday's study and likely reflects patient positioning. IMPRESSION: Mild improvement in pulmonary interstitial  edema. No alveolar pneumonia. Stable cardiomegaly with central  pulmonary vascular congestion. Thoracic aortic atherosclerosis. Electronically Signed   By: David  Martinique M.D.   On: 12/06/2016 15:30    Cardiac Studies   TEE/DCCV to be scheduled  Patient Profile     61 y.o. 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 Pt admitted 04/02 w/ SOB, cards saw for a flutter, amio increased for this 03/30 office visit. CHA2DS2/VAS Stroke Risk Points is least 3 (CHF, CAD, HTN). On coumadin.   Assessment & Plan    Atrial flutter -DCCV on 3/8>SR, now back in atrial flutter. Amiodarone was increased 03/30 from 200 mg qd>>200 mg bid. MD advise on change to 400 mg bid for a week. - not on lopressor, but not sure BP would tolerate -CHA2DS2/VAS Stroke Risk Points is 3 (CHF, CAD, HTN). He is anticoagulated with coumadin. INR is 1.95. Managed by pharmacy.  -Plan for TEE/DCCV tomorrow, but may not be able to schedule. GI to see first     CAD -He is s/p CABG and PCI May 2016 at [facility] and most recently anterior STEMI (after stopping Plavix) due to occlusion of SVG-LAD.  - Plavix was stopped so he could be placed on the renal transplant list.  - Continue Plavix, beta blocker and statin. No ASA due to use of coumadin along with Plavix.  -No exertional chest pain. He has chronic DOE, his shortness of breath was better after dialysis, needs nebs now.   Chronic diastolic heart failure -Grade 2 DD. BNP 1624.4  (previous BNP levels have been 1,085 to 2505) -Wt. Stable -177>>174 lbs after HD -Fluid status managed with dialysis  S/P bioprosthetic AVR -Mild AI by TEE 11/2016  Mitral  regurgitation -severe MAC, restricted posterior leaflet  - mod-severe posterior MR by TEE 11/11/2016  HTN -SBP 90s-100s, takes midodrine with dialysis.  - not on BB now  ESRD -S/P failure renal transplant. Now on HD M,W,F. Managed by nephrology.  Swallowing difficulty -Food gets stuck - calling GI consult  -Possible EGD prior to TEE on Weds or Thurs.   Otherwise, per IM Principal Problem:   Acute pulmonary edema (HCC) Active Problems:   Hypotension   Multiple sclerosis (HCC)   OSA -not compliant with C-pap   ESRD (end stage renal disease) on dialysis ([place])   Typical atrial flutter ([place])   History of tissue AVR 2014   Hx of CABG x 2 2014  Signed, Lenoard Aden 9:56 AM 12/07/2016 Pager: [phone]  As above, patient seen and examined. He describes some dyspnea today but no chest pain. He remains in what appears to be atrial tachycardia versus slow atrial flutter. Continue higher dose of amiodarone. Continue Coumadin. We will await GI evaluation concerning dysphagia. If no contraindication we will plan to proceed with TEE guided cardioversion tomorrow. Hopefully reestablishing sinus rhythm will improve his symptoms of dyspnea. Note his blood pressure will not allow additional AV nodal blocking agents. Also note his INR is 1.95. I will add IV heparin until his INR is greater than 2 in anticipation of cardioversion tomorrow.  Kirk Ruths, MD

## 2016-12-07 NOTE — Consult Note (Addendum)
[facility] Consult: 10:27 AM 12/07/2016  LOS: 1 day    Referring Provider: Dr Maylene Roes  Primary Care Physician:  Teressa Lower, MD Primary Gastroenterologist: Dr Lyndel Safe in [place]    Reason for Consultation:  Dysphagia in pt needing TEE   HPI: [name] is a 61 y.o. male.  PMH CAD.  CABG and Edwards bioprosthetic AVR 04/2013.  NSTEMI 05/2015.  Anterior STEMI 02/2016: s/p PTCA/DES.  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  On HD 2000 - 2008.  s/p renal transplant 2008.  Back on HD in 2011 after transplant failed.  Multiple sclerosis.  OSA (bipap intolerant).  COPD, chronic resp failure on oxygen.   s/p ~ 2010 incisional herniorrhaphy (hernia at renal transplant scar) but has developed a new, asymptomatic  hernia in the RLQ.  Blood loss/transfusion requiring anemia due to right groin hematoma 02/2016.  Hx of anemia, in past treated with parenteral iron but has not had infusion for a long time.   Hx PAF, on Coumadin and Plavix.  s/p TEE/DCCV in 07/2016, converted to NSR. Repeat cardioversion 11/11/16 with successful conversion to NSR; at TEE found to have moderate MV regurge but is not a surgical candidate.  Back in A flutter by 12/03/16, Dr Angelena Form increased dose of Amiodarone.    Admitted 3/12 -  11/18/16 with influenza, COPD flare, Rx with Tamiflu.  Elevated Troponins due to demand ischemia.  c/o SOB, found to be in rapid A fib.   12/05/16 CXR:  Extensive interstitial fluid and small right pleural effusion. 12/06/16 CXR: improved pulmonary edema.    Pt describes intermittent solid food (meats, rice, large pills are the worse) dysphagia dating back several years.  In the last month has odynophagia where solids trigger discomfort in mid to lower esophagus that feels like a "spiked golf ball".   Interestingly, patient is  not on H2 blocker or PPI therapy at home.  Once daily oral Pepcid has been started. As for bowel movements, patient associates periodic constipation with the phosphate binders his renal disease requires him to take.  According to the nephrology consult of 4/2, patient does receive weekly Venofer but no Epogen..  Patient says that he had an upper endoscopy many years ago, he thinks by Dr. Melina Copa in [place]. It was not performed because of dysphagia but sounds more like due to pain of some sort. He was told that he had irritable bowel/no this stomach. At [facility] in 10/2009 he underwent colonoscopy which she and his wife recall as being unremarkable.. Since that time, a few years ago, he met with Dr. Lyndel Safe in [place] regarding repeat screening colonoscopy and for assessment of the hernia in his right lower abdomen. Dr. Lyndel Safe said that unless the patient was having issues such as GI bleeding, he would not pursue any colonoscopy due to his high risk for sedation. Likewise, regarding the asymptomatic hernia, Dr. Lyndel Safe recommended leaving it alone.     Past Medical History:  Diagnosis Date  . Acute blood loss anemia    a. 02/2016 due to groin hematoma. Rec 3 U PRBC.  Marland Kitchen  Anemia   . Aortic heart valve prolapse 04/2013   a. s/p bioprosthetic AVR at time of CABG.  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 Bifascicular block   . CAD (coronary artery disease) 04/2013   a. 04/2013: s/p CABG x 2 (Y SVG -LAD & D2). b. 01/2015: NSTEMI s/p DES to LCx, BMS to SVG-LAD. c. NSTEMI 05/2015 during AF/AFL - non-flow limiting FFR. d. Low risk nuc 3/17. e. STEMI 6/17 after coming off Plavix, s/p DES to SVG-LAD into native vessel.  . Carotid artery disease ([place])    a. 1-39% stenosis bilaterally in 06/2015.   Marland Kitchen Chronic respiratory failure ([place])   . COPD (chronic obstructive pulmonary disease) ([place])   . Dyspnea   . 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 b. He had a renal Tx  from 2008 to 2011, but subsequent rejection - Gets HD in [place], [place] on MWF schedule.    . Hematoma    a. Large right groin hematoma after cath 02/2016 with associated ABL anemia.  . Hypertension   . MS (multiple sclerosis) ([place])   . Multiple sclerosis ([place])   . Nocturnal hypoxemia   . On home oxygen therapy    pt states he has not been wearing it  . PAF (paroxysmal atrial fibrillation) ([place])    a. h/o, placed on amiodarone 07/2016 due to recurrence.  . Paroxysmal atrial flutter ([place])    a. During 05/2015 admission - SVT, atrial flutter, and PAF.  Marland Kitchen Peripheral vascular disease ([place])    Cath in 01/2015 required 45 cm [place]  . Renal transplant failure and rejection   . 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 intolerant of bipap.  Marland Kitchen SVT (supraventricular tachycardia) ([place])   . Valvular heart disease    a. 2D Echo 03/25/16: mild LVH, EF 50-55%, grade 2 Dd, AVR present with mild AI, mod MR, severely dilated LA, mildly dilated RV, mod RAE, PASP 72.    Past Surgical History:  Procedure Laterality Date  . AV FISTULA PLACEMENT    . CARDIAC CATHETERIZATION N/A 05/26/2015   Procedure: Left Heart Cath and Coronary Angiography;  Surgeon: Leonie Man, MD;  Location: [facility];  Service: Cardiovascular;  Laterality: N/A;  . CARDIAC CATHETERIZATION N/A 05/26/2015   Procedure: Intravascular Pressure Wire/FFR Study;  Surgeon: Leonie Man, MD;  Location: [facility];  Service: Cardiovascular;  Laterality: N/A;  . CARDIAC CATHETERIZATION N/A 02/15/2016   Procedure: Left Heart Cath and Coronary Angiography;  Surgeon: Wellington Hampshire, MD;  Location: [facility];  Service: Cardiovascular;  Laterality: N/A;  . CARDIOVERSION N/A 07/12/2016   Procedure: CARDIOVERSION;  Surgeon: Minus Breeding, MD;  Location: [facility] ENDOSCOPY;  Service: Cardiovascular;  Laterality: N/A;  . CARDIOVERSION N/A 11/11/2016   Procedure: CARDIOVERSION;  Surgeon:  Fay Records, MD;  Location: [facility];  Service: Cardiovascular;  Laterality: N/A;  . CORONARY ARTERY BYPASS GRAFT    . excised squamous cells at rectum  2006  . flash     flash pulmonary edema  . HERNIA REPAIR  07/2011  . KIDNEY TRANSPLANT  08/2007  . KNEE SURGERY    . LEFT HEART CATHETERIZATION WITH CORONARY ANGIOGRAM N/A 01/09/2013   Procedure: LEFT HEART CATHETERIZATION WITH CORONARY ANGIOGRAM;  Surgeon: Burnell Blanks, MD;  Location: [facility] CATH  LAB;  Service: Cardiovascular;  Laterality: N/A;  . TEE WITHOUT CARDIOVERSION N/A 11/11/2016   Procedure: TRANSESOPHAGEAL ECHOCARDIOGRAM (TEE);  Surgeon: Fay Records, MD;  Location: [facility] ENDOSCOPY;  Service: Cardiovascular;  Laterality: N/A;    Prior to Admission medications   Medication Sig Start Date End Date Taking? Authorizing Provider  acetaminophen (TYLENOL) 500 MG tablet Take 1,000 mg by mouth every 6 (six) hours as needed for pain or fever.   Yes Historical Provider, MD  albuterol (PROAIR HFA) 108 (90 BASE) MCG/ACT inhaler Inhale 1-2 puffs into the lungs every 6 (six) hours as needed for shortness of breath.  07/19/11  Yes Historical Provider, MD  amiodarone (PACERONE) 200 MG tablet Take 1 tablet (200 mg total) by mouth 2 (two) times daily. 12/03/16  Yes Burnell Blanks, MD  atorvastatin (LIPITOR) 40 MG tablet Take 20 mg by mouth at bedtime.    Yes Historical Provider, MD  calcium acetate (PHOSLO) 667 MG capsule Take 4,002 mg by mouth 3 (three) times daily with meals.    Yes Historical Provider, MD  citalopram (CELEXA) 20 MG tablet Take 20 mg by mouth at bedtime.    Yes Historical Provider, MD  clopidogrel (PLAVIX) 75 MG tablet Take 1 tablet (75 mg total) by mouth daily. 02/19/16  Yes Dayna N Dunn, PA-C  docusate sodium (COLACE) 100 MG capsule Take 400 mg by mouth 2 (two) times daily.    Yes Historical Provider, MD  folic acid (FOLVITE) 1 MG tablet Take 1 mg by mouth daily.    Yes Historical Provider, MD  gabapentin (NEURONTIN)  300 MG capsule Take 300 mg by mouth at bedtime.    Yes Historical Provider, MD  guaiFENesin (MUCINEX) 600 MG 12 hr tablet Take 1 tablet (600 mg total) by mouth 2 (two) times daily. 11/18/16  Yes Lavina Hamman, MD  ipratropium-albuterol (DUONEB) 0.5-2.5 (3) MG/3ML SOLN Take 3 mLs by nebulization every 4 (four) hours as needed. Patient taking differently: Take 3 mLs by nebulization 3 (three) times daily. scheduled 11/18/16  Yes Lavina Hamman, MD  midodrine (PROAMATINE) 10 MG tablet Take 1 tablet (10 mg total) by mouth every Monday, Wednesday, and Friday with hemodialysis. Patient taking differently: Take 10 mg by mouth See admin instructions. Take 1 tablet (10 mg) by mouth on Monday, Wednesday, Friday 1 1/2 hours before dialysis 07/14/16  Yes [name], MD  multivitamin (RENA-VIT) TABS tablet Take 1 tablet by mouth daily. 01/11/13  Yes Samella Parr, NP  nitroGLYCERIN (NITROSTAT) 0.4 MG SL tablet Place 0.4 mg under the tongue every 5 (five) minutes as needed for chest pain (max 3 doses - if no relief call MD).    Yes Historical Provider, MD  OXYGEN Inhale 4 L into the lungs continuous.   Yes Historical Provider, MD  polyethylene glycol (MIRALAX / GLYCOLAX) packet Take 17 g by mouth at bedtime. Mix in 8 oz liquid and drink   Yes Historical Provider, MD  ranitidine (ZANTAC) 150 MG tablet Take 150 mg by mouth daily.    Yes Historical Provider, MD  sodium polystyrene (KAYEXALATE) 15 GM/60ML suspension Take 15 g by mouth See admin instructions. Take 60 ml (15 g) by mouth in the morning on non-dialysis days - Sunday, Tuesday, Thursday, Saturday   Yes Historical Provider, MD  warfarin (COUMADIN) 7.5 MG tablet Take 1/2-1 tablet by mouth daily as directed by coumadin clinic Patient taking differently: take 1/2 tablet by mouth daily at bedtime or as directed by coumadin clinic 10/13/16  Yes Sueanne Margarita, MD    Scheduled Meds: . amiodarone  200 mg Oral BID  . atorvastatin  20 mg Oral QHS  . calcium acetate   4,002 mg Oral TID WC  . citalopram  20 mg Oral QHS  . clopidogrel  75 mg Oral Daily  . docusate sodium  400 mg Oral BID  . famotidine  20 mg Oral Daily  . [START ON 12/08/2016] ferric gluconate (FERRLECIT/NULECIT) IV  125 mg Intravenous Q Wed-HD  . folic acid  1 mg Oral Daily  . gabapentin  300 mg Oral QHS  . guaiFENesin  600 mg Oral BID  . ipratropium-albuterol  3 mL Nebulization TID  . mouth rinse  15 mL Mouth Rinse BID  . midodrine  10 mg Oral Once per day on Mon Wed Fri  . mometasone-formoterol  2 puff Inhalation BID  . multivitamin  1 tablet Oral QHS  . predniSONE  50 mg Oral Q breakfast  . [START ON 12/08/2016] warfarin  3.75 mg Oral q1800  . warfarin  5 mg Oral ONCE-1800  . Warfarin - Pharmacist Dosing Inpatient   Does not apply q1800   Infusions:  PRN Meds: acetaminophen **OR** acetaminophen, levalbuterol, nitroGLYCERIN, ondansetron **OR** ondansetron (ZOFRAN) IV   Allergies as of 12/05/2016 - Review Complete 12/05/2016  Allergen Reaction Noted  . Diphenhydramine Itching 12/04/2012  . Penicillins Other (See Comments) 02/12/2012  . Bupropion Other (See Comments) 12/05/2016  . Adhesive [tape] Rash 02/12/2012  . Amoxicillin Rash 01/29/2016  . Ativan [lorazepam] Anxiety 09/10/2016  . Other Rash 01/29/2016    Family History  Problem Relation Age of Onset  . Heart failure Mother     started in 50s  . Emphysema Mother     smoked  . Diabetes Father   . Lupus Sister   . Heart attack Maternal Grandfather   . Heart attack Maternal Uncle   . Heart attack Maternal Aunt   . Hypertension Maternal Aunt   . Stroke Neg Hx     Social History   Social History  . Marital status: Married    Spouse name: N/A  . Number of children: N/A  . Years of education: N/A   Occupational History  . Disabled    Social History Main Topics  . Smoking status: Former Smoker    Packs/day: 2.00    Years: 20.00    Types: Cigarettes    Quit date: 08/06/1998  . Smokeless tobacco: Never Used   . Alcohol use No  . Drug use: No  . Sexual activity: Not on file   Other Topics Concern  . Not on file   Social History Narrative   No history of premature CAD in either parents or siblings. However, his maternal grandfather and 2 maternal uncles had coronary artery disease.    REVIEW OF SYSTEMS: Constitutional:  Some weakness and malaise. ENT:  No nose bleeds Pulm:  Shortness of breath. Tight cough, not able to bring up much sputum but feels like it is fair. CV:  Sense of arrhythmia/palpitations present.  No LE edema. No chest pain GU:  No hematuria, no frequency GI:  pe HPI Heme:  Bruises pretty easily but has not had any extensive bleeding.   Transfusions:  Recalls transfusions at the time of his kidney transplant and again when he had the groin hematoma. Neuro:  No headaches, no peripheral tingling or numbness Derm:  No itching, no rash or sores.  Endocrine:  No sweats or chills.  No polyuria  or dysuria Immunization:  Had his flu shot in 07/2016, Pneumovax in 01/2013. Travel:  None beyond local counties in last few months.    PHYSICAL EXAM: Vital signs in last 24 hours: Vitals:   12/07/16 0231 12/07/16 0530  BP: 92/74 91/73  Pulse: (!) 124 (!) 116  Resp: 18 18  Temp: 97.9 F (36.6 C) 98.1 F (36.7 C)   Wt Readings from Last 3 Encounters:  12/07/16 79.2 kg (174 lb 11.2 oz)  12/03/16 80.9 kg (178 lb 6.4 oz)  11/18/16 81 kg (178 lb 9.6 oz)    General: Pleasant, alert, comfortable but chronically ill and frail appearing WF. He looks older than his stated age. Head:  No facial asymmetry or swelling. No signs of head trauma.  Eyes:  No scleral icterus. No conjunctival pallor. Ears:  Not hard of hearing.  Nose:  No discharge or congestion. Mouth:  Poor dentition, the bulk of his tooth are gone. The remainder of his teeth are riddled with dental caries. Tongue is midline. Oral mucosa is moist and clear. Neck:  No JVD, masses or thyromegaly. Lungs:  Tight sounding  cough. Slight dyspnea with prolonged speech. Lungs clear but reduced breath sounds/limited airflow. Nasal cannula oxygen in place. Heart:  irreg irreg, + soft murmer.  s1 and s2 present. Abdomen:  Active bowel sounds. Soft. Noticeable hernia in the right lower quadrant. Possible hernias as well in the left lower abdomen and in mid pelvis. No tenderness..   Rectal: Deferred rectal exam.   Musc/Skeltl: No gross joint contracture deformities or swelling. Extremities:  No CCE.  AV fistula in left arm.   Neurologic:  Patient is fully alert. Oriented times 3. Good historian. No tremor or limb weakness. Skin:  Hematoma in the right lower quadrant of the abdomen. No suspicious lesions or open sores. Nodes:  No cervical or inguinal adenopathy.   Psych:  In good spirits, calm, pleasant.  Intake/Output from previous day: 04/02 0701 - 04/03 0700 In: -  Out: 3800  Intake/Output this shift: No intake/output data recorded.  LAB RESULTS:  Recent Labs  12/05/16 2134 12/05/16 2152 12/06/16 0940 12/07/16 0558  WBC 9.4  --  14.1* 9.5  HGB 12.9* 13.6 12.7* 12.7*  HCT 40.2 40.0 39.5 39.4  PLT 95*  --  96* 98*   BMET Lab Results  Component Value Date   NA 135 12/07/2016   NA 136 12/06/2016   NA 137 12/05/2016   K 5.1 12/07/2016   K 4.1 12/06/2016   K 5.9 (H) 12/05/2016   CL 95 (L) 12/07/2016   CL 96 (L) 12/06/2016   CL 101 12/05/2016   CO2 26 12/07/2016   CO2 27 12/06/2016   CO2 25 12/05/2016   GLUCOSE 96 12/07/2016   GLUCOSE 103 (H) 12/06/2016   GLUCOSE 120 (H) 12/05/2016   BUN 48 (H) 12/07/2016   BUN 22 (H) 12/06/2016   BUN 67 (H) 12/05/2016   CREATININE 6.91 (H) 12/07/2016   CREATININE 3.89 (H) 12/06/2016   CREATININE 9.50 (H) 12/05/2016   CALCIUM 8.5 (L) 12/07/2016   CALCIUM 8.5 (L) 12/06/2016   CALCIUM 7.8 (L) 12/05/2016   LFT  Recent Labs  12/05/16 2257 12/06/16 0940  PROT 6.9 7.7  ALBUMIN 3.2* 3.5  AST 20 22  ALT 37 38  ALKPHOS 121 119  BILITOT 1.1 0.9  BILIDIR  0.2  --   IBILI 0.9  --    PT/INR Lab Results  Component Value Date   INR 1.95 12/07/2016  INR 1.98 12/05/2016   INR 1.9 11/29/2016   Hepatitis Panel No results for input(s): HEPBSAG, HCVAB, HEPAIGM, HEPBIGM in the last 72 hours. C-Diff No components found for: CDIFF Lipase     Component Value Date/Time   LIPASE 39 12/05/2016 2257    Drugs of Abuse  No results found for: LABOPIA, COCAINSCRNUR, LABBENZ, AMPHETMU, THCU, LABBARB   RADIOLOGY STUDIES: Dg Chest 2 View  Result Date: 12/05/2016 CLINICAL DATA:  Worsening dyspnea for 2 days. EXAM: CHEST  2 VIEW COMPARISON:  11/15/2016 FINDINGS: Extensive interstitial fluid or thickening. No confluent airspace consolidation. Small right effusion, extending into the fissures. Stable cardiomegaly.  Prior sternotomy and aortic valvuloplasty. IMPRESSION: Extensive interstitial fluid and small right pleural effusion. No focal airspace consolidation. Stable cardiomegaly. Electronically Signed   By: Andreas Newport M.D.   On: 12/05/2016 22:25   Dg Chest Port 1 View  Result Date: 12/06/2016 CLINICAL DATA:  Follow-up pulmonary edema EXAM: PORTABLE CHEST 1 VIEW COMPARISON:  PA and lateral chest x-ray of December 05, 2016 FINDINGS: The lungs are well-expanded. The interstitial markings remain prominent but are less conspicuous today. There is a small amount of fluid in the minor fissure. The cardiac silhouette remains enlarged. The pulmonary vascularity remains engorged. There is calcification in the wall of the aortic arch. The patient has undergone previous median sternotomy. Curvature of the lower thoracic spine is new since yesterday's study and likely reflects patient positioning. IMPRESSION: Mild improvement in pulmonary interstitial edema. No alveolar pneumonia. Stable cardiomegaly with central pulmonary vascular congestion. Thoracic aortic atherosclerosis. Electronically Signed   By: David  Martinique M.D.   On: 12/06/2016 15:30     IMPRESSION:   *   Dysphagia/odynophagia. Intermittent symptoms though the substernal pain occurring with swallowing is suggestive of spasm. Given his history of multiple sclerosis, wonder about esophageal dysmotility.  *  Arrhythmia. Atrial tachycardia/flutter. Cardiology would like to proceed with TEE and cardioversion tomorrow if there are no contraindications based on findings of the esophagram.  *  Extensive history coronary artery disease and subsequent placement of drug eluting stent, bioprosthetic aortic valve replacement.  On both Plavix and Coumadin.  *  ESRD.  2008 renal transplant failed and return to hemodialysis 2011.      PLAN:     *  Ordered barium esophagram with tablet. Tech in radiology says they should be able to get this done this afternoon. He needs to be NPO for this so will skip lunch and restart meals later today.  *  Given the patient is on both antiplatelet, Plavix and anticoagulant, Coumadin wonder if we shouldn't upgrade the acid controlling medication to a PPI?      Azucena Freed  12/07/2016, 10:27 AM Pager: [phone]   Attending physician's note   I have taken a history, examined the patient and reviewed the chart. I agree with the Advanced Practitioner's note, impression and recommendations. 56 yr M with h/o intermittent dysphagia, Aflutter planned for cardioversion and TEE prior to that. Will follow Barium esophagogram, if has no obvious stricture or mucosal abnormality can proceed with TEE. Follow up with GI as outpatient once acute cardiac issues resolve  Empiric PPI daily for possible GERD related esophagitis X 2 months Please call with any questions  K Denzil Magnuson, MD [phone] Mon-Fri 8a-5p [phone] after 5p, weekends, holidays  Addendum:  Barium esophagogram was normal with no evidence of stricture.  Will sign off, please call with any questions  K. Denzil Magnuson , MD [phone] Mon-Fri 8a-5p [phone] after  5p, weekends, holidays

## 2016-12-07 NOTE — Progress Notes (Signed)
Called for alarm of sustained VT.  Monitor reviewed and demonstrates similar morphology to his most recent ECGs; these are also similar to his previous admission for atrial flutter which responded to cardioversion.  No new VT present.  Plan is to cardiovert after loading with amiodarone.  Baruch Merl, MD, PhD Cardiology

## 2016-12-07 NOTE — Progress Notes (Addendum)
PROGRESS NOTE    [name]  NGE:[id number] DOB: 11-16-1955 DOA: 12/05/2016 PCP: Teressa Lower, MD     Brief Narrative:  [name] is a 61 y.o. male with history of CAD s/p 2V CABG in 2014 (Y SVG to LAD &D2), NSTEMI 01/2015 s/p DES to LCx and BMS to SVG-LAD at 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 Patient states since his discharge after being treated for an influenza patient has been having persistent shortness of breath. But last evening patient became acutely short of breath. Denies any chest pain. Has been having some nonproductive cough. In the ER chest x-ray shows features concerning for pulmonary edema. On call nephrologist Dr. Justin Mend has been consulted. Patient was admitted for acute respiratory failure likely from acute pulmonary edema.   Assessment & Plan:   Principal Problem:   Acute pulmonary edema (HCC) Active Problems:   Hypotension   Multiple sclerosis (HCC)   OSA -not compliant with C-pap   ESRD (end stage renal disease) on dialysis ([place])   Typical atrial flutter ([place])   History of tissue AVR 2014   Hx of CABG x 2 2014  Acute on chronic hypoxemic respiratory failure -Requires 2L [place] O2  at nighttime at baseline due to OSA intolerant of CPAP. Most recently since influenza dx 3 weeks ago, has been on [place] O2 daily   -Suspect combination of fluid overload, COPD, ?amiodarone toxicity, A fib/flutter   Acute exacerbation COPD  -Follows with Dr. Melvyn Novas -Duoneb. Will add xopenex, dulera today. Continue prednisone, doxycycline   ESRD on HD  -Per Nephrology   Atrial tachycardia versus slow atrial flutter  -S/p DCCV 3/8 but now back in flutter/fib -Continue coumadin/heparin  drip -Amiodarone recently increased to '200mg'$  BID on 3/30 -Looks like metoprolol discontinued (10/27/16 A fib office note) due to hypotension in HD. Per Dr. Angelena Form (note 3/30), he states to continue lopressor but it's not on current med list. His blood pressure will not allow additional AV nodal blocking agents for now  -Cardiology following, planning for TEE guided cardioversion 4/4  Dysphagia -GI consulted, plan for barium esophagram this afternoon   Chronic hypotension -Midodrine with HD   CAD s/p CABG -Denies any chest pain. On Plavix and statins  Multiple sclerosis -Stable  -PT to eval  Depression -Continue celexa    DVT prophylaxis: coumadin/heparin gtt Code Status: full Family Communication: family at bedside Disposition Plan: pending improvement    Consultants:   Nephrology  Cardiology  Procedures:   None  Antimicrobials:   None    Subjective: Continues to have shortness of breath, no chest pain. No new complaints today.  Objective: Vitals:   12/06/16 2313 12/07/16 0231 12/07/16 0530 12/07/16 1237  BP: '98/74 92/74 91/73 '$   Pulse: (!) 125 (!) 124 (!) 116   Resp: '18 18 18   '$ Temp: 99 F (37.2 C) 97.9 F (36.6 C) 98.1 F (36.7 C)   TempSrc: Oral     SpO2: 96% 100% 96% 100%  Weight:   79.2 kg (174 lb 11.2 oz)   Height:       No intake or output data in the 24 hours ending 12/07/16 1407 Filed Weights   12/06/16 1000 12/06/16 1135 12/07/16 0530  Weight: 79.2 kg (174  lb 9.7 oz) 80.4 kg (177 lb 4 oz) 79.2 kg (174 lb 11.2 oz)    Examination:  General exam: Appears calm and comfortable  Respiratory system: Expiratory wheezes bilaterally, Respiratory effort normal. On nasal cannula oxygen Cardiovascular system: S1 & S2 heard, irregular rhythm, tachycardic rate 120s to 130s. No JVD, murmurs, rubs, gallops or clicks. Trace pedal edema. Gastrointestinal system: Abdomen is nondistended, soft and nontender. No organomegaly or masses felt. Normal bowel  sounds heard. Central nervous system: Alert and oriented. No focal neurological deficits. Extremities: Symmetric 5 x 5 power. Skin: No rashes, lesions or ulcers Psychiatry: Judgement and insight appear normal. Mood & affect appropriate.   Data Reviewed: I have personally reviewed following labs and imaging studies  CBC:  Recent Labs Lab 12/05/16 2134 12/05/16 2152 12/06/16 0940 12/07/16 0558  WBC 9.4  --  14.1* 9.5  HGB 12.9* 13.6 12.7* 12.7*  HCT 40.2 40.0 39.5 39.4  MCV 106.3*  --  106.8* 106.8*  PLT 95*  --  96* 98*   Basic Metabolic Panel:  Recent Labs Lab 12/05/16 2134 12/05/16 2152 12/05/16 2257 12/06/16 0940 12/07/16 0558  NA 139 137  --  136 135  K 6.1* 5.9*  --  4.1 5.1  CL 96* 101  --  96* 95*  CO2 25  --   --  27 26  GLUCOSE 120* 120*  --  103* 96  BUN 73* 67*  --  22* 48*  CREATININE 8.90* 9.50*  --  3.89* 6.91*  CALCIUM 7.8*  --   --  8.5* 8.5*  MG  --   --  2.3  --  2.1   GFR: Estimated Creatinine Clearance: 11.6 mL/min (A) (by C-G formula based on SCr of 6.91 mg/dL (H)). Liver Function Tests:  Recent Labs Lab 12/05/16 2257 12/06/16 0940  AST 20 22  ALT 37 38  ALKPHOS 121 119  BILITOT 1.1 0.9  PROT 6.9 7.7  ALBUMIN 3.2* 3.5    Recent Labs Lab 12/05/16 2257  LIPASE 39   No results for input(s): AMMONIA in the last 168 hours. Coagulation Profile:  Recent Labs Lab 12/05/16 2257 12/07/16 0558  INR 1.98 1.95   Cardiac Enzymes:  Recent Labs Lab 12/06/16 0229 12/06/16 0940 12/06/16 1352  TROPONINI 0.16* 0.16* 0.11*   BNP (last 3 results) No results for input(s): PROBNP in the last 8760 hours. HbA1C: No results for input(s): HGBA1C in the last 72 hours. CBG: No results for input(s): GLUCAP in the last 168 hours. Lipid Profile: No results for input(s): CHOL, HDL, LDLCALC, TRIG, CHOLHDL, LDLDIRECT in the last 72 hours. Thyroid Function Tests: No results for input(s): TSH, T4TOTAL, FREET4, T3FREE, THYROIDAB in the last 72  hours. Anemia Panel: No results for input(s): VITAMINB12, FOLATE, FERRITIN, TIBC, IRON, RETICCTPCT in the last 72 hours. Sepsis Labs: No results for input(s): PROCALCITON, LATICACIDVEN in the last 168 hours.  No results found for this or any previous visit (from the past 240 hour(s)).     Radiology Studies: Dg Chest 2 View  Result Date: 12/05/2016 CLINICAL DATA:  Worsening dyspnea for 2 days. EXAM: CHEST  2 VIEW COMPARISON:  11/15/2016 FINDINGS: Extensive interstitial fluid or thickening. No confluent airspace consolidation. Small right effusion, extending into the fissures. Stable cardiomegaly.  Prior sternotomy and aortic valvuloplasty. IMPRESSION: Extensive interstitial fluid and small right pleural effusion. No focal airspace consolidation. Stable cardiomegaly. Electronically Signed   By: Andreas Newport M.D.   On: 12/05/2016 22:25   Dg Chest [facility]  1 View  Result Date: 12/06/2016 CLINICAL DATA:  Follow-up pulmonary edema EXAM: PORTABLE CHEST 1 VIEW COMPARISON:  PA and lateral chest x-ray of December 05, 2016 FINDINGS: The lungs are well-expanded. The interstitial markings remain prominent but are less conspicuous today. There is a small amount of fluid in the minor fissure. The cardiac silhouette remains enlarged. The pulmonary vascularity remains engorged. There is calcification in the wall of the aortic arch. The patient has undergone previous median sternotomy. Curvature of the lower thoracic spine is new since yesterday's study and likely reflects patient positioning. IMPRESSION: Mild improvement in pulmonary interstitial edema. No alveolar pneumonia. Stable cardiomegaly with central pulmonary vascular congestion. Thoracic aortic atherosclerosis. Electronically Signed   By: David  Martinique M.D.   On: 12/06/2016 15:30      Scheduled Meds: . amiodarone  200 mg Oral BID  . atorvastatin  20 mg Oral QHS  . calcium acetate  4,002 mg Oral TID WC  . citalopram  20 mg Oral QHS  . clopidogrel  75  mg Oral Daily  . docusate sodium  400 mg Oral BID  . doxycycline  100 mg Oral Q12H  . famotidine  20 mg Oral Daily  . [START ON 12/08/2016] ferric gluconate (FERRLECIT/NULECIT) IV  125 mg Intravenous Q Wed-HD  . folic acid  1 mg Oral Daily  . gabapentin  300 mg Oral QHS  . guaiFENesin  600 mg Oral BID  . ipratropium-albuterol  3 mL Nebulization TID  . mouth rinse  15 mL Mouth Rinse BID  . midodrine  10 mg Oral Once per day on Mon Wed Fri  . mometasone-formoterol  2 puff Inhalation BID  . multivitamin  1 tablet Oral QHS  . predniSONE  50 mg Oral Q breakfast  . [START ON 12/08/2016] warfarin  3.75 mg Oral q1800  . warfarin  5 mg Oral ONCE-1800  . Warfarin - Pharmacist Dosing Inpatient   Does not apply q1800   Continuous Infusions: . heparin       LOS: 1 day    Time spent: 30 minutes   Dessa Phi, DO [facility] [email or web address] Password [email or web address] 12/07/2016, 2:07 PM

## 2016-12-07 NOTE — Progress Notes (Signed)
Notified cardiology regarding patient runs of V tach throughout the night.  Elevated troponin level on day of 0.11.  Patient is stable.  Heart rate running in 120's.  Patient is calm, sitting in recliner at this time.  Will continue to monitor the patient and notify as needed

## 2016-12-07 NOTE — Progress Notes (Signed)
[place] for heparin Indication: atrial fibrillation  Allergies  Allergen Reactions  . Diphenhydramine Itching    Only with IV doses. Tolerates oral.  . Penicillins Other (See Comments)    Migraine Has patient had a PCN reaction causing immediate rash, facial/tongue/throat swelling, SOB or lightheadedness with hypotension: no Has patient had a PCN reaction causing severe rash involving mucus membranes or skin necrosis: No Has patient had a PCN reaction that required hospitalization No Has patient had a PCN reaction occurring within the last 10 years: No If all of the above answers are "NO", then may proceed with Cephalosporin use.  . Bupropion Other (See Comments)    Caused anxiety  . Adhesive [Tape] Rash    Please use paper tape  . Amoxicillin Rash    migraine  . Ativan [Lorazepam] Anxiety  . Other Rash    Please use paper tape    Patient Measurements: Height: '5\' 10"'$  (177.8 cm) Weight: 174 lb 11.2 oz (79.2 kg) IBW/kg (Calculated) : 73  Vital Signs: Temp: 98.5 F (36.9 C) (04/03 2217) BP: 116/83 (04/03 2217) Pulse Rate: 122 (04/03 2217)  Labs:  Recent Labs  12/05/16 2134 12/05/16 2152 12/05/16 2257 12/06/16 0229 12/06/16 0940 12/06/16 1352 12/07/16 0558 12/07/16 2203  HGB 12.9* 13.6  --   --  12.7*  --  12.7*  --   HCT 40.2 40.0  --   --  39.5  --  39.4  --   PLT 95*  --   --   --  96*  --  98*  --   LABPROT  --   --  22.8*  --   --   --  22.5*  --   INR  --   --  1.98  --   --   --  1.95  --   HEPARINUNFRC  --   --   --   --   --   --   --  <0.10*  CREATININE 8.90* 9.50*  --   --  3.89*  --  6.91*  --   TROPONINI  --   --   --  0.16* 0.16* 0.11*  --   --     Estimated Creatinine Clearance: 11.6 mL/min (A) (by C-G formula based on SCr of 6.91 mg/dL (H)).  Assessment: 61 y.o. male with h/o Afib and subtherapeutic INR, awaiting DCCV tomorrow,  for heparin  Goal of Therapy:  Heparin level 0.3-0.7 INR 2-3 Monitor  platelets by anticoagulation protocol: Yes   Plan:  Increase Heparin  1500 units/hr Follow-up am labs.  Phillis Knack, PharmD, BCPS 12/07/2016 11:05 PM

## 2016-12-07 NOTE — Progress Notes (Addendum)
ANTICOAGULATION CONSULT NOTE - Follow-up Consult  Pharmacy Consult for warfarin and heparin Indication: atrial fibrillation  Allergies  Allergen Reactions  . Diphenhydramine Itching    Only with IV doses. Tolerates oral.  . Penicillins Other (See Comments)    Migraine Has patient had a PCN reaction causing immediate rash, facial/tongue/throat swelling, SOB or lightheadedness with hypotension: no Has patient had a PCN reaction causing severe rash involving mucus membranes or skin necrosis: No Has patient had a PCN reaction that required hospitalization No Has patient had a PCN reaction occurring within the last 10 years: No If all of the above answers are "NO", then may proceed with Cephalosporin use.  . Bupropion Other (See Comments)    Caused anxiety  . Adhesive [Tape] Rash    Please use paper tape  . Amoxicillin Rash    migraine  . Ativan [Lorazepam] Anxiety  . Other Rash    Please use paper tape    Patient Measurements: Height: '5\' 10"'$  (177.8 cm) Weight: 174 lb 11.2 oz (79.2 kg) IBW/kg (Calculated) : 73  Vital Signs: Temp: 98.1 F (36.7 C) (04/03 0530) Temp Source: Oral (04/02 2313) BP: 91/73 (04/03 0530) Pulse Rate: 116 (04/03 0530)  Labs:  Recent Labs  12/05/16 2134 12/05/16 2152 12/05/16 2257 12/06/16 0229 12/06/16 0940 12/06/16 1352 12/07/16 0558  HGB 12.9* 13.6  --   --  12.7*  --  12.7*  HCT 40.2 40.0  --   --  39.5  --  39.4  PLT 95*  --   --   --  96*  --  98*  LABPROT  --   --  22.8*  --   --   --  22.5*  INR  --   --  1.98  --   --   --  1.95  CREATININE 8.90* 9.50*  --   --  3.89*  --  6.91*  TROPONINI  --   --   --  0.16* 0.16* 0.11*  --     Estimated Creatinine Clearance: 11.6 mL/min (A) (by C-G formula based on SCr of 6.91 mg/dL (H)).   Medical History: Past Medical History:  Diagnosis Date  . Acute blood loss anemia    a. 02/2016 due to groin hematoma. Rec 3 U PRBC.  Marland Kitchen Anemia   . Aortic heart valve prolapse 04/2013   a. s/p  bioprosthetic AVR at time of CABG.  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 Bifascicular block   . CAD (coronary artery disease) 04/2013   a. 04/2013: s/p CABG x 2 (Y SVG -LAD & D2). b. 01/2015: NSTEMI s/p DES to LCx, BMS to SVG-LAD. c. NSTEMI 05/2015 during AF/AFL - non-flow limiting FFR. d. Low risk nuc 3/17. e. STEMI 6/17 after coming off Plavix, s/p DES to SVG-LAD into native vessel.  . Carotid artery disease ([employer])    a. 1-39% stenosis bilaterally in 06/2015.   Marland Kitchen Chronic respiratory failure ([place])   . COPD (chronic obstructive pulmonary disease) ([place])   . Dyspnea   . 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 b. He had a renal Tx from 2008 to 2011, but subsequent rejection - Gets HD in [place], [place] on MWF schedule.    . Hematoma    a. Large right groin hematoma after cath 02/2016 with associated ABL anemia.  . Hypertension   . MS (multiple sclerosis) ([place])   . Multiple sclerosis ([place])   . Nocturnal  hypoxemia   . On home oxygen therapy    pt states he has not been wearing it  . PAF (paroxysmal atrial fibrillation) ([place])    a. h/o, placed on amiodarone 07/2016 due to recurrence.  . Paroxysmal atrial flutter ([place])    a. During 05/2015 admission - SVT, atrial flutter, and PAF.  Marland Kitchen Peripheral vascular disease ([place])    Cath in 01/2015 required 45 cm [place]  . Renal transplant failure and rejection   . 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 intolerant of bipap.  Marland Kitchen SVT (supraventricular tachycardia) ([place])   . Valvular heart disease    a. 2D Echo 03/25/16: mild LVH, EF 50-55%, grade 2 Dd, AVR present with mild AI, mod MR, severely dilated LA, mildly dilated RV, mod RAE, PASP 72.    Medications:  No current facility-administered medications on file prior to encounter.    Current Outpatient Prescriptions on File Prior to Encounter  Medication Sig Dispense Refill  . acetaminophen  (TYLENOL) 500 MG tablet Take 1,000 mg by mouth every 6 (six) hours as needed for pain or fever.    Marland Kitchen albuterol (PROAIR HFA) 108 (90 BASE) MCG/ACT inhaler Inhale 1-2 puffs into the lungs every 6 (six) hours as needed for shortness of breath.     Marland Kitchen amiodarone (PACERONE) 200 MG tablet Take 1 tablet (200 mg total) by mouth 2 (two) times daily. 180 tablet 1  . atorvastatin (LIPITOR) 40 MG tablet Take 20 mg by mouth at bedtime.     . calcium acetate (PHOSLO) 667 MG capsule Take 4,002 mg by mouth 3 (three) times daily with meals.     . citalopram (CELEXA) 20 MG tablet Take 20 mg by mouth at bedtime.     . clopidogrel (PLAVIX) 75 MG tablet Take 1 tablet (75 mg total) by mouth daily. 90 tablet 3  . docusate sodium (COLACE) 100 MG capsule Take 400 mg by mouth 2 (two) times daily.     . folic acid (FOLVITE) 1 MG tablet Take 1 mg by mouth daily.     Marland Kitchen gabapentin (NEURONTIN) 300 MG capsule Take 300 mg by mouth at bedtime.     Marland Kitchen guaiFENesin (MUCINEX) 600 MG 12 hr tablet Take 1 tablet (600 mg total) by mouth 2 (two) times daily. 14 tablet 0  . ipratropium-albuterol (DUONEB) 0.5-2.5 (3) MG/3ML SOLN Take 3 mLs by nebulization every 4 (four) hours as needed. (Patient taking differently: Take 3 mLs by nebulization 3 (three) times daily. scheduled) 120 mL 0  . midodrine (PROAMATINE) 10 MG tablet Take 1 tablet (10 mg total) by mouth every Monday, Wednesday, and Friday with hemodialysis. (Patient taking differently: Take 10 mg by mouth See admin instructions. Take 1 tablet (10 mg) by mouth on Monday, Wednesday, Friday 1 1/2 hours before dialysis)    . multivitamin (RENA-VIT) TABS tablet Take 1 tablet by mouth daily. 30 tablet 1  . nitroGLYCERIN (NITROSTAT) 0.4 MG SL tablet Place 0.4 mg under the tongue every 5 (five) minutes as needed for chest pain (max 3 doses - if no relief call MD).     Marland Kitchen ranitidine (ZANTAC) 150 MG tablet Take 150 mg by mouth daily.     . sodium polystyrene (KAYEXALATE) 15 GM/60ML suspension Take 15 g  by mouth See admin instructions. Take 60 ml (15 g) by mouth in the morning on non-dialysis days - Sunday, Tuesday, Thursday,  Saturday    . warfarin (COUMADIN) 7.5 MG tablet Take 1/2-1 tablet by mouth daily as directed by coumadin clinic (Patient taking differently: take 1/2 tablet by mouth daily at bedtime or as directed by coumadin clinic) 30 tablet 1     Assessment: 61 y.o. male admitted with SOB, h/o Afib, to continue warfarin. ESRD on HD MWF. 3/8 DCCV converted but has since back in a flutter. Last anticoag visit 3/26 with INR 1.95. Pharmacy consulted to manage warfarin.  No bleeding noted. INR remains slightly subtherapeutic today at 1.95. CBC stable Plan is for DCCV tomorrow. Pharmacy consulted to start heparin infusion.  Goal of Therapy:  INR 2-3 Monitor platelets by anticoagulation protocol: Yes   Plan:  Give warfarin 5 mg x 1 today and then continue 3.75 mg daily Monitor daily INR, CBC, clinical course, s/sx of bleed, PO intake, DDI  Start heparin infusion at 1150 units/hr Check anti-Xa level in 8 hours and daily while on heparin Continue to monitor H&H and platelets   Thank you for allowing [place] to participate in this patients care.  Jens Som, PharmD Clinical phone for 12/07/2016 from 7a-3:30p: [phone] If after 3:30p, please call main pharmacy at: [phone] 12/07/2016 10:22 AM

## 2016-12-07 NOTE — Consult Note (Signed)
   [facility] CM Inpatient Consult   12/07/2016  Kincade Granberg [birth year] [id number]  Patient reviewed for re-admission as a Medicare ACO patient noted with 3 admissions in 6 months.  Chart reviewed and spoke with inpatient RNCM, Levada Dy, regarding patient's post hospital needs.  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 Patient states since his discharge after being treated for an influenza patient has been having persistent shortness of breath. But last evening patient became acutely short of breath. Denies any chest pain. Has been having some nonproductive cough. In the ER chest x-ray shows features concerning for pulmonary edema. Patient was admitted for acute respiratory failure likely from acute pulmonary edema.  Came by to speak with patient but he had a room full of visitors. Will follow up.  Natividad Brood, RN BSN [place] [facility] Liaison  [phone] business mobile phone Toll free office [phone]

## 2016-12-07 NOTE — Progress Notes (Signed)
[facility] Progress Note   Subjective: breathing much better after HD yesterday  Vitals:   12/06/16 2102 12/06/16 2313 12/07/16 0231 12/07/16 0530  BP:  '98/74 92/74 91/73 '$  Pulse:  (!) 125 (!) 124 (!) 116  Resp:  '18 18 18  '$ Temp:  99 F (37.2 C) 97.9 F (36.6 C) 98.1 F (36.7 C)  TempSrc:  Oral    SpO2: 91% 96% 100% 96%  Weight:    79.2 kg (174 lb 11.2 oz)  Height:        Inpatient medications: . amiodarone  200 mg Oral BID  . atorvastatin  20 mg Oral QHS  . calcium acetate  4,002 mg Oral TID WC  . citalopram  20 mg Oral QHS  . clopidogrel  75 mg Oral Daily  . docusate sodium  400 mg Oral BID  . famotidine  20 mg Oral Daily  . [START ON 12/08/2016] ferric gluconate (FERRLECIT/NULECIT) IV  125 mg Intravenous Q Wed-HD  . folic acid  1 mg Oral Daily  . gabapentin  300 mg Oral QHS  . guaiFENesin  600 mg Oral BID  . ipratropium-albuterol  3 mL Nebulization TID  . mouth rinse  15 mL Mouth Rinse BID  . midodrine  10 mg Oral Once per day on Mon Wed Fri  . mometasone-formoterol  2 puff Inhalation BID  . multivitamin  1 tablet Oral QHS  . predniSONE  50 mg Oral Q breakfast  . [START ON 12/08/2016] warfarin  3.75 mg Oral q1800  . warfarin  5 mg Oral ONCE-1800  . Warfarin - Pharmacist Dosing Inpatient   Does not apply q1800    acetaminophen **OR** acetaminophen, levalbuterol, nitroGLYCERIN, ondansetron **OR** ondansetron (ZOFRAN) IV  Exam: Alert, nasal O2 , WDWN, no distress No jvd Chest clear bilat, no wheezing Cor tachy, no S3 Abd soft ntnd no ascites Ext no edema or open wounds NF Ox 3 Left AVF +bruit  Dialysis: MWF [facility]   4h   160  450/800  81kg    2/2.25 bath  LFA AVF   Hep 2000 - Venofer 50/wk - no ESA or VDRA - last Hb 12.9, tsat 30%, adj Ca 8.9  P 5 and pth 14 (s/p PTX),; 6 phoslo ac      Assessment: 1. Acute pulm edema / acute on chron resp distres - resolved with HD 2. Chronic lung disease - on 4L [place] at home 3. Vol overload - is 2kg under dry  wt now 4. ESRD MWF HD 5. Chron hypotens on midodrine 6. Anemia of ckD - not on ESA 7. MBD/ CKD - cont binders, sp ptx 8. Atrial flutter - on amio po.  Metoprolol on home meds list but patient says he doesn't have it at home and that "they took me off".  May have cardioversion soon per cards.  9. CAD hx CABG/ PCI 2016 [facility]) - recent STEMI d/t occlusion of SVG-LAD.   10. Hx bioprosthetic AVR   Plan - HD tomorrow, lower dry wt further as tolerated    Kelly Splinter MD [facility] pager [phone]   12/07/2016, 12:23 PM    Recent Labs Lab 12/05/16 2134 12/05/16 2152 12/06/16 0940 12/07/16 0558  NA 139 137 136 135  K 6.1* 5.9* 4.1 5.1  CL 96* 101 96* 95*  CO2 25  --  27 26  GLUCOSE 120* 120* 103* 96  BUN 73* 67* 22* 48*  CREATININE 8.90* 9.50* 3.89* 6.91*  CALCIUM 7.8*  --  8.5* 8.5*    Recent Labs Lab 12/05/16 2257 12/06/16 0940  AST 20 22  ALT 37 38  ALKPHOS 121 119  BILITOT 1.1 0.9  PROT 6.9 7.7  ALBUMIN 3.2* 3.5    Recent Labs Lab 12/05/16 2134 12/05/16 2152 12/06/16 0940 12/07/16 0558  WBC 9.4  --  14.1* 9.5  HGB 12.9* 13.6 12.7* 12.7*  HCT 40.2 40.0 39.5 39.4  MCV 106.3*  --  106.8* 106.8*  PLT 95*  --  96* 98*   Iron/TIBC/Ferritin/ %Sat No results found for: IRON, TIBC, FERRITIN, IRONPCTSAT

## 2016-12-07 NOTE — Evaluation (Signed)
Physical Therapy Evaluation Patient Details Name: [name] MRN: [id number] DOB: May 18, 1956 Today's Date: 12/07/2016   History of Present Illness  Pt is a 61 y/o male, admitted with ongoing SOB 2/2 pulmonary edema from volume overload.  PMH CAD, 2V CABG, NSTEMI, AF, ESRD (HD MWF), and MS.  Clinical Impression  Pt tolerate evaluation well.  Currently performing all mobility at supervision level or better.  Per wife, pt's ambulation is baseline.  Discussed progression of endurance with short walks until SOB and then rest break.  Pt with no further acute PT needs at this time.  Will sign off.     Follow Up Recommendations No PT follow up;Supervision - Intermittent    Equipment Recommendations  None recommended by PT    Recommendations for Other Services       Precautions / Restrictions Precautions Precautions: Other (comment) (SOB) Precaution Comments: watch sats Restrictions Weight Bearing Restrictions: No      Mobility  Bed Mobility Overal bed mobility: Independent                Transfers Overall transfer level: Independent Equipment used: None Transfers: Sit to/from Stand Sit to Stand: Supervision            Ambulation/Gait Ambulation/Gait assistance: Supervision Ambulation Distance (Feet): 50 Feet Assistive device: None Gait Pattern/deviations: Step-through pattern;Wide base of support;Trunk flexed (toe out bilat)   Gait velocity interpretation: at or above normal speed for age/gender General Gait Details: Pt with wide BOS, increased L<>R weight shift, trunk flexed, but no LOB during ambulation  Stairs            Wheelchair Mobility    Modified Rankin (Stroke Patients Only)       Balance Overall balance assessment: Needs assistance Sitting-balance support: No upper extremity supported;Feet supported Sitting balance-Leahy Scale: Good     Standing balance support: No upper extremity supported Standing balance-Leahy Scale: Good                                Pertinent Vitals/Pain Pain Assessment: No/denies pain    Home Living Family/patient expects to be discharged to:: Private residence Living Arrangements: Spouse/significant other Available Help at Discharge: Family;Available 24 hours/day Type of Home: House Home Access: Level entry     Home Layout: One level Home Equipment: [place] - 2 wheels;Cane - single point;Grab bars - tub/shower;Shower seat - built in      Prior Function Level of Independence: Independent         Comments: driving PTA     Hand Dominance        Extremity/Trunk Assessment        Lower Extremity Assessment Lower Extremity Assessment: Overall WFL for tasks assessed       Communication   Communication: No difficulties  Cognition Arousal/Alertness: Awake/alert Behavior During Therapy: WFL for tasks assessed/performed Overall Cognitive Status: Within Functional Limits for tasks assessed                                        General Comments      Exercises     Assessment/Plan    PT Assessment Patent does not need any further PT services  PT Problem List Cardiopulmonary status limiting activity       PT Treatment Interventions      PT Goals (Current goals can be found  in the Care Plan section)  Acute Rehab PT Goals Patient Stated Goal: go home PT Goal Formulation: With patient    Frequency     Barriers to discharge        Co-evaluation               End of Session Equipment Utilized During Treatment: Oxygen Activity Tolerance: Patient tolerated treatment well Patient left: in bed;with call bell/phone within reach;with family/visitor present Nurse Communication: Mobility status PT Visit Diagnosis: Difficulty in walking, not elsewhere classified (R26.2)    Time: 4239-5320 PT Time Calculation (min) (ACUTE ONLY): 10 min   Charges:   PT Evaluation $PT Eval Low Complexity: 1 Procedure     PT G Codes:         Lani Mendiola Penven-Crew, PT, DPT 12/07/16 3:11 PM

## 2016-12-07 NOTE — Care Management Note (Addendum)
Case Management Note  Patient Details  Name: [name] MRN: [id number] Date of Birth: February 09, 1956  Subjective/Objective:               Admitted with acute pulmonary edema, h/o Afib( warfarin),ESRD on HD MWF,  11/11/2016 s/p DCCV. Resides with wife. Pt states independent with ADL's, PTA. Pt with home oxygen (4L/ min).   Recently admitted:  Influenza/COPD exacerbation 3/12-3/15/2018.  Caden Fukushima (Spouse) Micah Noel (Daughter)     [phone]     [place] / [place] Alert  PCP: Teressa Lower  Action/Plan: Plan is to d/c to home when medically stable. CM to f/u with disposition needs.  Expected Discharge Date:                  Expected Discharge Plan:  Home/Self Care  In-House Referral:     Discharge planning Services  CM Consult  Post Acute Care Choice:  Resumption of Svcs/PTA Provider Choice offered to:     DME Arranged:  Oxygen DME Agency:   ([facility] Patient)  HH Arranged:    [place] Agency:     Status of Service:  In process, will continue to follow  If discussed at Long Length of Stay Meetings, dates discussed:    Additional Comments:  Sharin Mons, RN 12/07/2016, 2:20 PM

## 2016-12-08 ENCOUNTER — Inpatient Hospital Stay (HOSPITAL_COMMUNITY): Payer: Medicare Other

## 2016-12-08 DIAGNOSIS — Z951 Presence of aortocoronary bypass graft: Secondary | ICD-10-CM

## 2016-12-08 DIAGNOSIS — R000 Tachycardia, unspecified: Secondary | ICD-10-CM

## 2016-12-08 DIAGNOSIS — E875 Hyperkalemia: Secondary | ICD-10-CM

## 2016-12-08 DIAGNOSIS — I9589 Other hypotension: Secondary | ICD-10-CM

## 2016-12-08 DIAGNOSIS — Z992 Dependence on renal dialysis: Secondary | ICD-10-CM

## 2016-12-08 DIAGNOSIS — R0602 Shortness of breath: Secondary | ICD-10-CM

## 2016-12-08 DIAGNOSIS — G4733 Obstructive sleep apnea (adult) (pediatric): Secondary | ICD-10-CM

## 2016-12-08 LAB — HEPARIN LEVEL (UNFRACTIONATED)
Heparin Unfractionated: 0.35 IU/mL (ref 0.30–0.70)
Heparin Unfractionated: 0.35 IU/mL (ref 0.30–0.70)

## 2016-12-08 LAB — CBC
HEMATOCRIT: 41.2 % (ref 39.0–52.0)
Hemoglobin: 13 g/dL (ref 13.0–17.0)
MCH: 33.9 pg (ref 26.0–34.0)
MCHC: 31.6 g/dL (ref 30.0–36.0)
MCV: 107.3 fL — ABNORMAL HIGH (ref 78.0–100.0)
Platelets: 107 10*3/uL — ABNORMAL LOW (ref 150–400)
RBC: 3.84 MIL/uL — AB (ref 4.22–5.81)
RDW: 16 % — AB (ref 11.5–15.5)
WBC: 12.5 10*3/uL — AB (ref 4.0–10.5)

## 2016-12-08 LAB — BASIC METABOLIC PANEL
ANION GAP: 19 — AB (ref 5–15)
BUN: 71 mg/dL — AB (ref 6–20)
CO2: 24 mmol/L (ref 22–32)
Calcium: 8.9 mg/dL (ref 8.9–10.3)
Chloride: 95 mmol/L — ABNORMAL LOW (ref 101–111)
Creatinine, Ser: 8.81 mg/dL — ABNORMAL HIGH (ref 0.61–1.24)
GFR, EST AFRICAN AMERICAN: 7 mL/min — AB (ref >60)
GFR, EST NON AFRICAN AMERICAN: 6 mL/min — AB (ref >60)
Glucose, Bld: 93 mg/dL (ref 65–99)
POTASSIUM: 5 mmol/L (ref 3.5–5.1)
SODIUM: 138 mmol/L (ref 135–145)

## 2016-12-08 LAB — PROTIME-INR
INR: 2.36
PROTHROMBIN TIME: 26.3 s — AB (ref 11.4–15.2)

## 2016-12-08 LAB — MAGNESIUM: MAGNESIUM: 2.4 mg/dL (ref 1.7–2.4)

## 2016-12-08 MED ORDER — HEPARIN SODIUM (PORCINE) 1000 UNIT/ML DIALYSIS: 2000.0000 [IU] | Freq: Once | INTRAMUSCULAR | Status: DC

## 2016-12-08 MED ORDER — LIDOCAINE-PRILOCAINE 2.5-2.5 % EX CREA
1.0000 "application " | TOPICAL_CREAM | CUTANEOUS | Status: DC | PRN
  Filled 2016-12-08: qty 5

## 2016-12-08 MED ORDER — HEPARIN SODIUM (PORCINE) 1000 UNIT/ML DIALYSIS
1000.0000 [IU] | INTRAMUSCULAR | Status: DC | PRN
  Filled 2016-12-08: qty 1

## 2016-12-08 MED ORDER — PENTAFLUOROPROP-TETRAFLUOROETH EX AERO: 1.0000 "application " | INHALATION_SPRAY | CUTANEOUS | Status: DC | PRN

## 2016-12-08 MED ORDER — ALTEPLASE 2 MG IJ SOLR: 2.0000 mg | Freq: Once | INTRAMUSCULAR | Status: DC | PRN

## 2016-12-08 MED ORDER — LIDOCAINE HCL (PF) 1 % IJ SOLN
5.0000 mL | INTRAMUSCULAR | Status: DC | PRN
  Filled 2016-12-08: qty 5

## 2016-12-08 MED ORDER — SODIUM CHLORIDE 0.9 % IV SOLN: 100.0000 mL | INTRAVENOUS | Status: DC | PRN

## 2016-12-08 NOTE — Progress Notes (Signed)
ANTICOAGULATION CONSULT NOTE - Follow Up Consult  Pharmacy Consult for Heparin >> Coumadin Indication: atrial fibrillation  Allergies  Allergen Reactions  . Diphenhydramine Itching    Only with IV doses. Tolerates oral.  . Penicillins Other (See Comments)    Migraine Has patient had a PCN reaction causing immediate rash, facial/tongue/throat swelling, SOB or lightheadedness with hypotension: no Has patient had a PCN reaction causing severe rash involving mucus membranes or skin necrosis: No Has patient had a PCN reaction that required hospitalization No Has patient had a PCN reaction occurring within the last 10 years: No If all of the above answers are "NO", then may proceed with Cephalosporin use.  . Bupropion Other (See Comments)    Caused anxiety  . Adhesive [Tape] Rash    Please use paper tape  . Amoxicillin Rash    migraine  . Ativan [Lorazepam] Anxiety  . Other Rash    Please use paper tape    Patient Measurements: Height: '5\' 10"'$  (177.8 cm) Weight: 178 lb 2.1 oz (80.8 kg) IBW/kg (Calculated) : 73 Heparin Dosing Weight: 80 kg  Vital Signs: Temp: 97.7 F (36.5 C) (04/04 0543) Temp Source: Oral (04/04 0543) BP: 101/81 (04/04 0543) Pulse Rate: 123 (04/04 0816)  Labs:  Recent Labs  12/05/16 2257 12/06/16 0229 12/06/16 0940 12/06/16 1352 12/07/16 0558 12/07/16 2203 12/08/16 0807  HGB  --   --  12.7*  --  12.7*  --  13.0  HCT  --   --  39.5  --  39.4  --  41.2  PLT  --   --  96*  --  98*  --  107*  LABPROT 22.8*  --   --   --  22.5*  --  26.3*  INR 1.98  --   --   --  1.95  --  2.36  HEPARINUNFRC  --   --   --   --   --  <0.10* 0.35  CREATININE  --   --  3.89*  --  6.91*  --  8.81*  TROPONINI  --  0.16* 0.16* 0.11*  --   --   --     Estimated Creatinine Clearance: 9.1 mL/min (A) (by C-G formula based on SCr of 8.81 mg/dL (H)).   Medications:  Scheduled:  . amiodarone  200 mg Oral BID  . atorvastatin  20 mg Oral QHS  . calcium acetate  4,002 mg Oral  TID WC  . citalopram  20 mg Oral QHS  . clopidogrel  75 mg Oral Daily  . docusate sodium  400 mg Oral BID  . doxycycline  100 mg Oral Q12H  . famotidine  20 mg Oral Daily  . ferric gluconate (FERRLECIT/NULECIT) IV  125 mg Intravenous Q Wed-HD  . folic acid  1 mg Oral Daily  . gabapentin  300 mg Oral QHS  . guaiFENesin  600 mg Oral BID  . ipratropium-albuterol  3 mL Nebulization TID  . mouth rinse  15 mL Mouth Rinse BID  . midodrine  10 mg Oral Once per day on Mon Wed Fri  . mometasone-formoterol  2 puff Inhalation BID  . multivitamin  1 tablet Oral QHS  . pantoprazole  40 mg Oral Daily  . predniSONE  50 mg Oral Q breakfast  . warfarin  3.75 mg Oral q1800  . Warfarin - Pharmacist Dosing Inpatient   Does not apply q1800   Infusions:  . heparin 1,500 Units/hr (12/08/16 1102)    Assessment: 61  yo M admitted with SOB/volume overload, found to be in afib.  Pt on Coumadin PTA for hx afib, s/p DCCV in March.  INR was subtherapeutic so pt also started on heparin.  Now awaiting TEE/DCCV planned for tomorrow.  Heparin level currently therapeutic on 1500 units/hr.  Will order confirmation level to be drawn pre-HD to avoid multiple blood draws today.  INR also now therapeutic.  Will continue PTA Coumadin dose and heparin overlap gien plans for DCCV.  Goal of Therapy:  INR 2-3 Heparin level 0.3-0.7 units/ml Monitor platelets by anticoagulation protocol: Yes   Plan:  Continue heparin at 1500 units/hr Confirmation heparin level ordered to be drawn pre-HD Continue Coumadin 3.'75mg'$  PO daily Heparin level, CBC, and INR in AM.   Horton Chin, Pharm.D., BCPS Clinical Pharmacist Pager [phone] 12/08/2016 1:18 PM

## 2016-12-08 NOTE — Progress Notes (Signed)
[facility] Progress Note   Subjective: breathing better but still not back to baseline. Says he has noted abd swelling and leg swelling in the last several wks and wondering if that could have been fluid building up.    Vitals:   12/07/16 2217 12/08/16 0500 12/08/16 0543 12/08/16 0816  BP: 116/83  101/81   Pulse: (!) 122  (!) 118 (!) 123  Resp: '19  18 18  '$ Temp: 98.5 F (36.9 C)  97.7 F (36.5 C)   TempSrc:   Oral   SpO2: 94%  98% 95%  Weight:  80.8 kg (178 lb 2.1 oz)    Height:        Inpatient medications: . amiodarone  200 mg Oral BID  . atorvastatin  20 mg Oral QHS  . calcium acetate  4,002 mg Oral TID WC  . citalopram  20 mg Oral QHS  . clopidogrel  75 mg Oral Daily  . docusate sodium  400 mg Oral BID  . doxycycline  100 mg Oral Q12H  . famotidine  20 mg Oral Daily  . ferric gluconate (FERRLECIT/NULECIT) IV  125 mg Intravenous Q Wed-HD  . folic acid  1 mg Oral Daily  . gabapentin  300 mg Oral QHS  . guaiFENesin  600 mg Oral BID  . ipratropium-albuterol  3 mL Nebulization TID  . mouth rinse  15 mL Mouth Rinse BID  . midodrine  10 mg Oral Once per day on Mon Wed Fri  . mometasone-formoterol  2 puff Inhalation BID  . multivitamin  1 tablet Oral QHS  . pantoprazole  40 mg Oral Daily  . predniSONE  50 mg Oral Q breakfast  . warfarin  3.75 mg Oral q1800  . Warfarin - Pharmacist Dosing Inpatient   Does not apply q1800   . heparin 1,500 Units/hr (12/08/16 1102)   acetaminophen **OR** acetaminophen, levalbuterol, nitroGLYCERIN, ondansetron **OR** ondansetron (ZOFRAN) IV  Exam: Alert, nasal O2 , WDWN, no distress No jvd Chest crackles bilat bases, scattered, occ wheeze Cor tachy, no S3 Abd soft ntnd no ascites Ext trace - 1+ edema pretib NF Ox 3 Left AVF +bruit  Dialysis: MWF [facility]   4h   160  450/800  81kg    2/2.25 bath  LFA AVF   Hep 2000 - Venofer 50/wk - no ESA or VDRA - last Hb 12.9, tsat 30%, adj Ca 8.9  P 5 and pth 14 (s/p PTX),; 6 phoslo  ac      Assessment: 1. Acute pulm edema / acute on chron resp distres - improving 2. Chronic lung disease - on 4L [place] at home 3. Vol overload - is at dry wt but has lost body wt and needs more fluid off 4. ESRD MWF HD 5. Chron hypotens on midodrine 6. Anemia of ckD - not on ESA 7. MBD/ CKD - cont binders, sp ptx 8. Atrial flutter - on amio po.  Metoprolol on home meds list but patient says he doesn't have it at home and that "they took me off".  May have DCCV procedure tomorrow.  9. CAD hx CABG/ PCI 2016 [facility]) - recent STEMI d/t occlusion of SVG-LAD.   10. Hx bioprosthetic AVR   Plan - HD again today, max UF, get vol down and lower dry wt as is possible   Kelly Splinter MD [place] pager [phone]   12/08/2016, 1:21 PM    Recent Labs Lab 12/06/16 0940 12/07/16 0558 12/08/16 0807  NA 136 135  138  K 4.1 5.1 5.0  CL 96* 95* 95*  CO2 '27 26 24  '$ GLUCOSE 103* 96 93  BUN 22* 48* 71*  CREATININE 3.89* 6.91* 8.81*  CALCIUM 8.5* 8.5* 8.9    Recent Labs Lab 12/05/16 2257 12/06/16 0940  AST 20 22  ALT 37 38  ALKPHOS 121 119  BILITOT 1.1 0.9  PROT 6.9 7.7  ALBUMIN 3.2* 3.5    Recent Labs Lab 12/06/16 0940 12/07/16 0558 12/08/16 0807  WBC 14.1* 9.5 12.5*  HGB 12.7* 12.7* 13.0  HCT 39.5 39.4 41.2  MCV 106.8* 106.8* 107.3*  PLT 96* 98* 107*   Iron/TIBC/Ferritin/ %Sat No results found for: IRON, TIBC, FERRITIN, IRONPCTSAT

## 2016-12-08 NOTE — Progress Notes (Signed)
PROGRESS NOTE    [name]  XBL:[id number] DOB: July 20, 1956 DOA: 12/05/2016 PCP: Teressa Lower, MD   Brief Narrative: [name] is a 61 y.o. 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 He presented with dyspnea and found to have acute pulmonary edema.   Assessment & Plan:   Principal Problem:   Acute pulmonary edema (HCC) Active Problems:   Hypotension   Multiple sclerosis (HCC)   OSA -not compliant with C-pap   ESRD (end stage renal disease) on dialysis ([place])   Typical atrial flutter ([place])   History of tissue AVR 2014   Hx of CABG x 2 2014   Acute on chronic hypoxemic respiratory failure Baseline of 2L [place] at night. Current exacerbation secondary to COPD and pulmonary edema. Improved slightly  Acute COPD exacerbation -Continue Duoneb, Xopenex, and Dulera -Continue prednisone -Continue doxycycline  ESRD on HD -Nephrology recommendations  Atrial tachycardia vs slow atrial flutter Cardiology planning TEE DCCV -Cardiology recommendations TEE DCCV on 4/5 -continue Amiodarone  Dysphagia Barium swallow unremarkable. GI signed off  CAD s/p CABG No chest pain -Continue Plavix -Continue statin  Multiple sclerosis Stable   DVT prophylaxis: Heparin and Coumadin. Likely discontinue heparin. Code Status: Full code Family Communication: None at bedside Disposition Plan: Discharge pending improvement of respiratory symptoms   Consultants:   Nephrology  Cardiology  Procedures:   Dialysis  Antimicrobials:  Doxycycline   Subjective: Patient reports continued dyspnea. No chest pain.   Objective: Vitals:   12/07/16 2217 12/08/16 0500 12/08/16 0543  12/08/16 0816  BP: 116/83  101/81   Pulse: (!) 122  (!) 118 (!) 123  Resp: '19  18 18  '$ Temp: 98.5 F (36.9 C)  97.7 F (36.5 C)   TempSrc:   Oral   SpO2: 94%  98% 95%  Weight:  80.8 kg (178 lb 2.1 oz)    Height:        Intake/Output Summary (Last 24 hours) at 12/08/16 1411 Last data filed at 12/08/16 0534  Gross per 24 hour  Intake           273.33 ml  Output                0 ml  Net           273.33 ml   Filed Weights   12/06/16 1135 12/07/16 0530 12/08/16 0500  Weight: 80.4 kg (177 lb 4 oz) 79.2 kg (174 lb 11.2 oz) 80.8 kg (178 lb 2.1 oz)    Examination:  General exam: Appears calm and comfortable Respiratory system: Expiratory wheezing, prolonged expiratory phase. Respiratory effort normal. Cardiovascular system: S1 & S2 heard, increased rate, normal rhythm. No murmurs, rubs, gallops or clicks. Gastrointestinal system: Abdomen is nondistended, soft and nontender. No organomegaly or masses felt. Normal bowel sounds heard. Central nervous system: Alert and oriented. No focal neurological deficits. Extremities: No edema. No calf tenderness Skin: No cyanosis. No rashes Psychiatry: Judgement and insight appear normal. Mood & affect appropriate.     Data Reviewed: I have personally reviewed following labs and imaging studies  CBC:  Recent Labs Lab 12/05/16 2134 12/05/16 2152 12/06/16 0940 12/07/16 0558 12/08/16 0807  WBC 9.4  --  14.1* 9.5  12.5*  HGB 12.9* 13.6 12.7* 12.7* 13.0  HCT 40.2 40.0 39.5 39.4 41.2  MCV 106.3*  --  106.8* 106.8* 107.3*  PLT 95*  --  96* 98* 962*   Basic Metabolic Panel:  Recent Labs Lab 12/05/16 2134 12/05/16 2152 12/05/16 2257 12/06/16 0940 12/07/16 0558 12/08/16 0807  NA 139 137  --  136 135 138  K 6.1* 5.9*  --  4.1 5.1 5.0  CL 96* 101  --  96* 95* 95*  CO2 25  --   --  '27 26 24  '$ GLUCOSE 120* 120*  --  103* 96 93  BUN 73* 67*  --  22* 48* 71*  CREATININE 8.90* 9.50*  --  3.89* 6.91* 8.81*  CALCIUM 7.8*  --   --  8.5*  8.5* 8.9  MG  --   --  2.3  --  2.1 2.4   GFR: Estimated Creatinine Clearance: 9.1 mL/min (A) (by C-G formula based on SCr of 8.81 mg/dL (H)). Liver Function Tests:  Recent Labs Lab 12/05/16 2257 12/06/16 0940  AST 20 22  ALT 37 38  ALKPHOS 121 119  BILITOT 1.1 0.9  PROT 6.9 7.7  ALBUMIN 3.2* 3.5    Recent Labs Lab 12/05/16 2257  LIPASE 39   No results for input(s): AMMONIA in the last 168 hours. Coagulation Profile:  Recent Labs Lab 12/05/16 2257 12/07/16 0558 12/08/16 0807  INR 1.98 1.95 2.36   Cardiac Enzymes:  Recent Labs Lab 12/06/16 0229 12/06/16 0940 12/06/16 1352  TROPONINI 0.16* 0.16* 0.11*   BNP (last 3 results) No results for input(s): PROBNP in the last 8760 hours. HbA1C: No results for input(s): HGBA1C in the last 72 hours. CBG: No results for input(s): GLUCAP in the last 168 hours. Lipid Profile: No results for input(s): CHOL, HDL, LDLCALC, TRIG, CHOLHDL, LDLDIRECT in the last 72 hours. Thyroid Function Tests: No results for input(s): TSH, T4TOTAL, FREET4, T3FREE, THYROIDAB in the last 72 hours. Anemia Panel: No results for input(s): VITAMINB12, FOLATE, FERRITIN, TIBC, IRON, RETICCTPCT in the last 72 hours. Sepsis Labs: No results for input(s): PROCALCITON, LATICACIDVEN in the last 168 hours.  No results found for this or any previous visit (from the past 240 hour(s)).      Radiology Studies: Dg Esophagus  Result Date: 12/07/2016 CLINICAL DATA:  Difficulty swallowing in a scheduled for transesophageal echocardiogram EXAM: ESOPHOGRAM/BARIUM SWALLOW TECHNIQUE: Single contrast examination was performed using  thin barium. FLUOROSCOPY TIME:  Fluoroscopy Time:  0 minutes and 42 seconds. Radiation Exposure Index (if provided by the fluoroscopic device): 4.1 mGy Number of Acquired Spot Images: 0 COMPARISON:  None. FINDINGS: Due to patient relative immobility , single contrast study was performed. Fluoro table was placed in a 45 degree upright  position and the patient was turned LPO relative to the fluoro table. He was then given thin barium to drink and monitored fluoroscopically. There is no evidence for esophageal mass or stricture. No esophageal diverticulum. 13 mm barium tablet passed readily into the stomach when taken with water. IMPRESSION: 1. No evidence for esophageal narrowing or stricture. Electronically Signed   By: Misty Stanley M.D.   On: 12/07/2016 14:21   Dg Chest Port 1 View  Result Date: 12/06/2016 CLINICAL DATA:  Follow-up pulmonary edema EXAM: PORTABLE CHEST 1 VIEW COMPARISON:  PA and lateral chest x-ray of December 05, 2016 FINDINGS: The lungs are well-expanded. The interstitial markings remain prominent but are less conspicuous today. There is a small amount of fluid in  the minor fissure. The cardiac silhouette remains enlarged. The pulmonary vascularity remains engorged. There is calcification in the wall of the aortic arch. The patient has undergone previous median sternotomy. Curvature of the lower thoracic spine is new since yesterday's study and likely reflects patient positioning. IMPRESSION: Mild improvement in pulmonary interstitial edema. No alveolar pneumonia. Stable cardiomegaly with central pulmonary vascular congestion. Thoracic aortic atherosclerosis. Electronically Signed   By: David  Martinique M.D.   On: 12/06/2016 15:30        Scheduled Meds: . amiodarone  200 mg Oral BID  . atorvastatin  20 mg Oral QHS  . calcium acetate  4,002 mg Oral TID WC  . citalopram  20 mg Oral QHS  . clopidogrel  75 mg Oral Daily  . docusate sodium  400 mg Oral BID  . doxycycline  100 mg Oral Q12H  . famotidine  20 mg Oral Daily  . ferric gluconate (FERRLECIT/NULECIT) IV  125 mg Intravenous Q Wed-HD  . folic acid  1 mg Oral Daily  . gabapentin  300 mg Oral QHS  . guaiFENesin  600 mg Oral BID  . ipratropium-albuterol  3 mL Nebulization TID  . mouth rinse  15 mL Mouth Rinse BID  . midodrine  10 mg Oral Once per day on Mon  Wed Fri  . mometasone-formoterol  2 puff Inhalation BID  . multivitamin  1 tablet Oral QHS  . pantoprazole  40 mg Oral Daily  . predniSONE  50 mg Oral Q breakfast  . warfarin  3.75 mg Oral q1800  . Warfarin - Pharmacist Dosing Inpatient   Does not apply q1800   Continuous Infusions: . heparin 1,500 Units/hr (12/08/16 1102)     LOS: 2 days     Cordelia Poche, MD [facility] 12/08/2016, 2:11 PM Pager: [phone]  If 7PM-7AM, please contact night-coverage [email or web address] Password [email or web address] 12/08/2016, 2:11 PM

## 2016-12-08 NOTE — Progress Notes (Signed)
Progress Note  Patient Name: [name] Date of Encounter: 12/08/2016  Primary Cardiologist: Dr Angelena Form  Patient Profile     61 y.o. 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 Pt admitted 04/02 w/ SOB, cards saw for a flutter, amio increased for this 03/30 office visit. CHA2DS2/VAS Stroke Risk Points is least 3 (CHF, CAD, HTN). On coumadin.  Subjective   SOB unchanged; no chest pain  Inpatient Medications    Scheduled Meds: . amiodarone  200 mg Oral BID  . atorvastatin  20 mg Oral QHS  . calcium acetate  4,002 mg Oral TID WC  . citalopram  20 mg Oral QHS  . clopidogrel  75 mg Oral Daily  . docusate sodium  400 mg Oral BID  . famotidine  20 mg Oral Daily  . [START ON 12/08/2016] ferric gluconate (FERRLECIT/NULECIT) IV  125 mg Intravenous Q Wed-HD  . folic acid  1 mg Oral Daily  . gabapentin  300 mg Oral QHS  . guaiFENesin  600 mg Oral BID  . ipratropium-albuterol  3 mL Nebulization TID  . mouth rinse  15 mL Mouth Rinse BID  . midodrine  10 mg Oral Once per day on Mon Wed Fri  . multivitamin  1 tablet Oral QHS  . predniSONE  50 mg Oral Q breakfast  . warfarin  3.75 mg Oral q1800  . Warfarin - Pharmacist Dosing Inpatient   Does not apply q1800   Continuous Infusions: . heparin 1,150 Units/hr (12/07/16 1614)   PRN Meds: acetaminophen **OR** acetaminophen, levalbuterol, nitroGLYCERIN, ondansetron **OR** ondansetron (ZOFRAN) IV   Vital Signs    Vitals:   12/07/16 1937 12/07/16 2217 12/08/16 0500 12/08/16 0543  BP:  116/83  101/81  Pulse:  (!) 122  (!) 118  Resp:  19  18  Temp:   98.5 F (36.9 C)  97.7 F (36.5 C)  TempSrc:    Oral  SpO2: 97% 94%  98%  Weight:   178 lb 2.1 oz (80.8 kg)   Height:        Intake/Output Summary (Last 24 hours) at 12/08/16 0749 Last data filed at 12/08/16 0534  Gross per 24 hour  Intake           273.33 ml  Output                0 ml  Net           273.33 ml   Filed Weights   12/06/16 1135 12/07/16 0530 12/08/16 0500  Weight: 177 lb 4 oz (80.4 kg) 174 lb 11.2 oz (79.2 kg) 178 lb 2.1 oz (80.8 kg)    Telemetry    Atrial flutter w/ RVR, RBBB - Personally Reviewed  Physical Exam   General: Well developed, well nourished, male appearing in no acute distress. Head: Normal  Neck: Supple  Lungs:  Mild rhonci Heart: Irreg and tachycardic Abdomen: Soft, non-tender, non-distended  Extremities: No edema. AV fistula LUE Neuro: Alert and oriented X 3. Moves all extremities spontaneously. Psych: Normal affect.  Labs    Hematology  Recent Labs Lab  12/05/16 2134 12/05/16 2152 12/06/16 0940 12/07/16 0558  WBC 9.4  --  14.1* 9.5  RBC 3.78*  --  3.70* 3.69*  HGB 12.9* 13.6 12.7* 12.7*  HCT 40.2 40.0 39.5 39.4  MCV 106.3*  --  106.8* 106.8*  MCH 34.1*  --  34.3* 34.4*  MCHC 32.1  --  32.2 32.2  RDW 16.5*  --  16.1* 16.4*  PLT 95*  --  96* 98*    Chemistry  Recent Labs Lab 12/05/16 2134 12/05/16 2152 12/05/16 2257 12/06/16 0940 12/07/16 0558  NA 139 137  --  136 135  K 6.1* 5.9*  --  4.1 5.1  CL 96* 101  --  96* 95*  CO2 25  --   --  27 26  GLUCOSE 120* 120*  --  103* 96  BUN 73* 67*  --  22* 48*  CREATININE 8.90* 9.50*  --  3.89* 6.91*  CALCIUM 7.8*  --   --  8.5* 8.5*  PROT  --   --  6.9 7.7  --   ALBUMIN  --   --  3.2* 3.5  --   AST  --   --  20 22  --   ALT  --   --  37 38  --   ALKPHOS  --   --  121 119  --   BILITOT  --   --  1.1 0.9  --   GFRNONAA 6*  --   --  15* 8*  GFRAA 7*  --   --  18* 9*  ANIONGAP 18*  --   --  13 14     Cardiac Enzymes  Recent Labs Lab 12/06/16 0229 12/06/16 0940  12/06/16 1352  TROPONINI 0.16* 0.16* 0.11*     Recent Labs Lab 12/05/16 2149  TROPIPOC 0.11*     BNP  Recent Labs Lab 12/05/16 2257  BNP 1,624.4*    Lab Results  Component Value Date   INR 1.95 12/07/2016   INR 1.98 12/05/2016   INR 1.9 11/29/2016     Radiology    Dg Chest 2 View Result Date: 12/05/2016 CLINICAL DATA:  Worsening dyspnea for 2 days. EXAM: CHEST  2 VIEW COMPARISON:  11/15/2016 FINDINGS: Extensive interstitial fluid or thickening. No confluent airspace consolidation. Small right effusion, extending into the fissures. Stable cardiomegaly.  Prior sternotomy and aortic valvuloplasty. IMPRESSION: Extensive interstitial fluid and small right pleural effusion. No focal airspace consolidation. Stable cardiomegaly. Electronically Signed   By: Andreas Newport M.D.   On: 12/05/2016 22:25   Dg Chest Port 1 View Result Date: 12/06/2016 CLINICAL DATA:  Follow-up pulmonary edema EXAM: PORTABLE CHEST 1 VIEW COMPARISON:  PA and lateral chest x-ray of December 05, 2016 FINDINGS: The lungs are well-expanded. The interstitial markings remain prominent but are less conspicuous today. There is a small amount of fluid in the minor fissure. The cardiac silhouette remains enlarged. The pulmonary vascularity remains engorged. There is calcification in the wall of the aortic arch. The patient has undergone previous median sternotomy. Curvature of the lower thoracic spine is new since yesterday's study and likely reflects patient positioning. IMPRESSION: Mild improvement in pulmonary interstitial edema. No alveolar pneumonia. Stable cardiomegaly with central pulmonary vascular congestion. Thoracic aortic atherosclerosis. Electronically Signed   By: David  Martinique M.D.   On: 12/06/2016 15:30     Patient Profile     61 y.o. 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 Pt admitted 04/02 w/ SOB, cards saw for a flutter, amio increased for this 03/30 office visit. CHA2DS2/VAS Stroke Risk Points is least 3 (CHF, CAD, HTN). On coumadin.   Assessment & Plan    Atrial flutter -DCCV on 3/8>SR, now back in atrial flutter. Amiodarone was increased 03/30 from 200 mg qd>>200 mg bid.  -CHA2DS2/VAS Stroke Risk Points is 3 (CHF, CAD, HTN). He is anticoagulated with coumadin. Managed by pharmacy.  -Plan for TEE/DCCV tomorrow     CAD -He is s/p CABG and PCI May 2016 at [facility] and most recently anterior STEMI (after stopping Plavix) due to occlusion of SVG-LAD.  - Continue Plavix, beta blocker and statin. No ASA due to use of coumadin along with Plavix.   Chronic diastolic heart failure -Fluid status managed with dialysis  S/P bioprosthetic AVR -Mild AI by TEE 11/2016  Mitral regurgitation -severe MAC, restricted posterior leaflet  - mod-severe posterior MR by TEE 11/11/2016  HTN -SBP 90s-100s, takes midodrine with dialysis.   ESRD -S/P failure renal transplant. Now on HD M,W,F. Managed by nephrology.  Swallowing difficulty -Barium swallow with no stricture; fu GI as outpt  Signed, Kirk Ruths , MD 7:49 AM 12/08/2016

## 2016-12-08 NOTE — Consult Note (Signed)
   [facility] CM Inpatient Consult   12/08/2016  [name] [birth year] [id number]  Follow up:  Met with the patient at the bedside to explained [employer] services as a beneficiary of the Medicare ACO.Marland Kitchen  Patient states he has dialysis three times a week [MWF] .  He states he has follow up calls from his primary care and endorses [facility].  Patient denies issues with medications, transportation, or resource needs at this time. He states he would look over the brochure and information and share it with his wife. A brochure with contact information was given with a 24 hour nurse advise line magnet with information. Patient plan to follow up with his primary care provider and states that they call him as well.  For questions, please contact:  Cameron Brood, RN BSN [facility] Liaison  [phone] business mobile phone Toll free office [phone]

## 2016-12-08 NOTE — Progress Notes (Signed)
ANTICOAGULATION CONSULT NOTE - Follow Up Consult  Pharmacy Consult for Heparin and Coumadin Indication: atrial fibrillation  Patient Measurements: Height: '5\' 10"'$  (177.8 cm) Weight: 178 lb 2.1 oz (80.8 kg) IBW/kg (Calculated) : 73 Heparin Dosing Weight: 80 kg  Labs:  Recent Labs  12/05/16 2257 12/06/16 0229 12/06/16 0940 12/06/16 1352 12/07/16 0558 12/07/16 2203 12/08/16 0807 12/08/16 1533  HGB  --   --  12.7*  --  12.7*  --  13.0  --   HCT  --   --  39.5  --  39.4  --  41.2  --   PLT  --   --  96*  --  98*  --  107*  --   LABPROT 22.8*  --   --   --  22.5*  --  26.3*  --   INR 1.98  --   --   --  1.95  --  2.36  --   HEPARINUNFRC  --   --   --   --   --  <0.10* 0.35 0.35  CREATININE  --   --  3.89*  --  6.91*  --  8.81*  --   TROPONINI  --  0.16* 0.16* 0.11*  --   --   --   --     Estimated Creatinine Clearance: 9.1 mL/min (A) (by C-G formula based on SCr of 8.81 mg/dL (H)).   Medications:  Infusions:  . heparin 1,500 Units/hr (12/08/16 1102)    Assessment:   On Coumadin prior to admit for afib. INR was subtherapeutic (1.95) on 4/4 and heparin infusion begun.    Heparin level remains low therapeutic (0.35) on 1500 units/hr.   INR 2.36 today, back into therapeutic range.   For TEE/DCCV on 4/5, heparin continued while ensuring INR remains at goal.  Goal of Therapy:  INR 2-3 Heparin level 0.3-0.7 units/ml Monitor platelets by anticoagulation protocol: Yes   Plan:   Continue heparin drip at 1500 units/hr.  Coumadin 3.75 mg daily as ordered.  Heparin level, PT/INR and CBC in am.  For TEE/DCCV on 4/5.  Arty Baumgartner, [place] Pager: [phone] 12/08/2016,5:04 PM

## 2016-12-09 ENCOUNTER — Inpatient Hospital Stay (HOSPITAL_COMMUNITY): Payer: Medicare Other | Admitting: Anesthesiology

## 2016-12-09 ENCOUNTER — Inpatient Hospital Stay (HOSPITAL_COMMUNITY): Payer: Medicare Other

## 2016-12-09 ENCOUNTER — Encounter (HOSPITAL_COMMUNITY)
Admission: EM | Disposition: A | Discharge status: Home / Self Care | Payer: Self-pay | Source: Home / Self Care | Attending: Internal Medicine

## 2016-12-09 ENCOUNTER — Encounter (HOSPITAL_COMMUNITY): Payer: Self-pay

## 2016-12-09 DIAGNOSIS — I340 Nonrheumatic mitral (valve) insufficiency: Secondary | ICD-10-CM

## 2016-12-09 DIAGNOSIS — I483 Typical atrial flutter: Secondary | ICD-10-CM

## 2016-12-09 DIAGNOSIS — J810 Acute pulmonary edema: Secondary | ICD-10-CM

## 2016-12-09 DIAGNOSIS — Z952 Presence of prosthetic heart valve: Secondary | ICD-10-CM

## 2016-12-09 HISTORY — PX: CARDIOVERSION: SHX1299

## 2016-12-09 HISTORY — PX: TEE WITHOUT CARDIOVERSION: SHX5443

## 2016-12-09 LAB — CBC
HEMATOCRIT: 38.9 % — AB (ref 39.0–52.0)
Hemoglobin: 12.2 g/dL — ABNORMAL LOW (ref 13.0–17.0)
MCH: 33.4 pg (ref 26.0–34.0)
MCHC: 31.4 g/dL (ref 30.0–36.0)
MCV: 106.6 fL — ABNORMAL HIGH (ref 78.0–100.0)
PLATELETS: 103 10*3/uL — AB (ref 150–400)
RBC: 3.65 MIL/uL — ABNORMAL LOW (ref 4.22–5.81)
RDW: 16.1 % — AB (ref 11.5–15.5)
WBC: 9.9 10*3/uL (ref 4.0–10.5)

## 2016-12-09 LAB — PROTIME-INR
INR: 2.84
Prothrombin Time: 30.5 seconds — ABNORMAL HIGH (ref 11.4–15.2)

## 2016-12-09 LAB — HEPARIN LEVEL (UNFRACTIONATED): Heparin Unfractionated: 0.27 IU/mL — ABNORMAL LOW (ref 0.30–0.70)

## 2016-12-09 SURGERY — ECHOCARDIOGRAM, TRANSESOPHAGEAL
Anesthesia: General

## 2016-12-09 MED ORDER — PREDNISONE 20 MG PO TABS: ORAL_TABLET | ORAL | 0 refills | Status: DC

## 2016-12-09 MED ORDER — SODIUM CHLORIDE 0.9 % IV SOLN
INTRAVENOUS | Status: DC
Start: 2016-12-09 — End: 2016-12-09
  Administered 2016-12-09 (×2): via INTRAVENOUS

## 2016-12-09 MED ORDER — PHENYLEPHRINE HCL 10 MG/ML IJ SOLN
INTRAVENOUS | Status: DC | PRN
  Administered 2016-12-09: 40 ug/min via INTRAVENOUS

## 2016-12-09 MED ORDER — ETOMIDATE 2 MG/ML IV SOLN
INTRAVENOUS | Status: DC | PRN
  Administered 2016-12-09: 6 mg via INTRAVENOUS

## 2016-12-09 MED ORDER — PANTOPRAZOLE SODIUM 40 MG PO TBEC: 40.0000 mg | DELAYED_RELEASE_TABLET | Freq: Every day | ORAL | 0 refills | Status: DC

## 2016-12-09 MED ORDER — DOXYCYCLINE HYCLATE 100 MG PO TABS: 100.0000 mg | ORAL_TABLET | Freq: Two times a day (BID) | ORAL | 0 refills | Status: AC

## 2016-12-09 MED ORDER — PROPOFOL 500 MG/50ML IV EMUL
INTRAVENOUS | Status: DC | PRN
  Administered 2016-12-09: 50 ug/kg/min via INTRAVENOUS

## 2016-12-09 NOTE — Progress Notes (Signed)
Progress Note  Patient Name: Ettore Trebilcock Date of Encounter: 12/09/2016  Primary Cardiologist: Dr Angelena Form  Patient Profile     61 y.o. 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 Pt admitted 04/02 w/ SOB, cards saw for a flutter, amio increased for this 03/30 office visit. CHA2DS2/VAS Stroke Risk Points is least 3 (CHF, CAD, HTN). On coumadin.  Subjective   Denies dyspnea; no chest pain  Inpatient Medications    Scheduled Meds: . amiodarone  200 mg Oral BID  . atorvastatin  20 mg Oral QHS  . calcium acetate  4,002 mg Oral TID WC  . citalopram  20 mg Oral QHS  . clopidogrel  75 mg Oral Daily  . docusate sodium  400 mg Oral BID  . famotidine  20 mg Oral Daily  . [START ON 12/08/2016] ferric gluconate (FERRLECIT/NULECIT) IV  125 mg Intravenous Q Wed-HD  . folic acid  1 mg Oral Daily  . gabapentin  300 mg Oral QHS  . guaiFENesin  600 mg Oral BID  . ipratropium-albuterol  3 mL Nebulization TID  . mouth rinse  15 mL Mouth Rinse BID  . midodrine  10 mg Oral Once per day on Mon Wed Fri  . multivitamin  1 tablet Oral QHS  . predniSONE  50 mg Oral Q breakfast  . warfarin  3.75 mg Oral q1800  . Warfarin - Pharmacist Dosing Inpatient   Does not apply q1800   Continuous Infusions: . heparin 1,500 Units/hr (12/09/16 0216)   PRN Meds: [MAR Hold] acetaminophen **OR** [MAR Hold] acetaminophen, [MAR Hold] levalbuterol, [MAR Hold] nitroGLYCERIN, [MAR Hold] ondansetron **OR** [MAR Hold] ondansetron (ZOFRAN) IV   Vital Signs    Vitals:   12/09/16 0527 12/09/16 0528 12/09/16 0530 12/09/16 0824  BP:  99/72     Pulse: 82 (!) 35 (!) 124   Resp: 18     Temp: 98 F (36.7 C)   97.8 F (36.6 C)  TempSrc: Oral   Oral  SpO2: (!) 77% 92% 100%   Weight:      Height:        Intake/Output Summary (Last 24 hours) at 12/09/16 0835 Last data filed at 12/08/16 2311  Gross per 24 hour  Intake           137.12 ml  Output             3000 ml  Net         -2862.88 ml   Filed Weights   12/08/16 0500 12/08/16 1941 12/08/16 2311  Weight: 178 lb 2.1 oz (80.8 kg) 180 lb 12.4 oz (82 kg) 174 lb 2.6 oz (79 kg)    Telemetry    Atrial flutter w/ RVR, RBBB - Personally Reviewed  Physical Exam   General: Well developed, chronically ill appearing in no acute distress. Head: Normal  Neck: Supple  Lungs:  Mild rhonci; diminished BS Heart: Irreg and tachycardic Abdomen: Soft, non-tender, non-distended  Extremities: No edema. AV fistula LUE Neuro: Alert and oriented X 3. Moves all extremities spontaneously. Psych: Normal affect.  Labs    Hematology  Recent Labs Lab 12/07/16 0558 12/08/16 0807 12/09/16 0526  WBC 9.5 12.5* 9.9  RBC 3.69* 3.84* 3.65*  HGB 12.7* 13.0 12.2*  HCT 39.4 41.2 38.9*  MCV 106.8* 107.3* 106.6*  MCH 34.4* 33.9 33.4  MCHC 32.2 31.6 31.4  RDW 16.4* 16.0* 16.1*  PLT 98* 107* 103*    Chemistry  Recent Labs Lab 12/05/16 2257 12/06/16 0940 12/07/16 0558 12/08/16 0807  NA  --  136 135 138  K  --  4.1 5.1 5.0  CL  --  96* 95* 95*  CO2  --  _0 GLUCOSE  --  103* 96 93  BUN  --  22* 48* 71*  CREATININE  --  3.89* 6.91* 8.81*  CALCIUM  --  8.5* 8.5* 8.9  PROT 6.9 7.7  --   --   ALBUMIN 3.2* 3.5  --   --   AST 20 22  --   --   ALT 37 38  --   --   ALKPHOS 121 119  --   --   BILITOT 1.1 0.9  --   --   GFRNONAA  --  15* 8* 6*  GFRAA  --  18* 9* 7*  ANIONGAP  --  13 14 19*     Cardiac Enzymes  Recent Labs Lab 12/06/16 0229 12/06/16 0940 12/06/16 1352  TROPONINI 0.16* 0.16* 0.11*     Recent Labs Lab 12/05/16 2149  TROPIPOC 0.11*      BNP  Recent Labs Lab 12/05/16 2257  BNP 1,624.4*    Lab Results  Component Value Date   INR 2.84 12/09/2016   INR 2.36 12/08/2016   INR 1.95 12/07/2016     Radiology    Dg Chest 2 View Result Date: 12/05/2016 CLINICAL DATA:  Worsening dyspnea for 2 days. EXAM: CHEST  2 VIEW COMPARISON:  11/15/2016 FINDINGS: Extensive interstitial fluid or thickening. No confluent airspace consolidation. Small right effusion, extending into the fissures. Stable cardiomegaly.  Prior sternotomy and aortic valvuloplasty. IMPRESSION: Extensive interstitial fluid and small right pleural effusion. No focal airspace consolidation. Stable cardiomegaly. Electronically Signed   By: Andreas Newport M.D.   On: 12/05/2016 22:25   Dg Chest Port 1 View Result Date: 12/06/2016 CLINICAL DATA:  Follow-up pulmonary edema EXAM: PORTABLE CHEST 1 VIEW COMPARISON:  PA and lateral chest x-ray of December 05, 2016 FINDINGS: The lungs are well-expanded. The interstitial markings remain prominent but are less conspicuous today. There is a small amount of fluid in the minor fissure. The cardiac silhouette remains enlarged. The pulmonary vascularity remains engorged. There is calcification in the wall of the aortic arch. The patient has undergone previous median sternotomy. Curvature of the lower thoracic spine is new since yesterday's study and likely reflects patient positioning. IMPRESSION: Mild improvement in pulmonary interstitial edema. No alveolar pneumonia. Stable cardiomegaly with central pulmonary vascular congestion. Thoracic aortic atherosclerosis. Electronically Signed   By: David  Martinique M.D.   On: 12/06/2016 15:30     Patient Profile     60 y.o. 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 Pt admitted 04/02  w/ SOB, cards saw for a flutter, amio increased for this 03/30 office visit. CHA2DS2/VAS Stroke Risk Points is least 3 (CHF, CAD, HTN). On coumadin.   Assessment & Plan    Atrial flutter -DCCV on 3/8>SR, now back in atrial flutter. Amiodarone was increased 03/30 from 200 mg qd>>200 mg bid.  -CHA2DS2/VAS Stroke Risk Points is 3 (CHF, CAD, HTN). He is anticoagulated with coumadin. Managed by pharmacy. DC heparin as INR now therapeutic -Plan for TEE/DCCV today     CAD -He is s/p CABG and PCI May 2016 at [facility] and most recently anterior STEMI (after stopping Plavix) due to occlusion of SVG-LAD.  - Continue Plavix, beta blocker and statin. No ASA due to use of coumadin along with Plavix.   Chronic diastolic heart failure -Fluid status managed with dialysis  S/P bioprosthetic AVR -Mild AI by TEE 11/2016  Mitral regurgitation -severe MAC, restricted posterior leaflet  - mod-severe posterior MR by TEE 11/11/2016  HTN -SBP 90s-100s, takes midodrine with dialysis.   ESRD -S/P failure renal transplant. Now on HD M,W,F. Managed by nephrology.  Swallowing difficulty -Barium swallow with no stricture; fu GI as outpt  Signed, Kirk Ruths , MD 8:35 AM 12/09/2016

## 2016-12-09 NOTE — Anesthesia Postprocedure Evaluation (Addendum)
Anesthesia Post Note  Patient: [name]  Procedure(s) Performed: Procedure(s) (LRB): TRANSESOPHAGEAL ECHOCARDIOGRAM (TEE) (N/A) CARDIOVERSION (N/A)  Patient location during evaluation: PACU Anesthesia Type: General Level of consciousness: awake and alert and patient cooperative Pain management: pain level controlled Vital Signs Assessment: post-procedure vital signs reviewed and stable Respiratory status: spontaneous breathing and respiratory function stable Cardiovascular status: stable Anesthetic complications: no       Last Vitals:  Vitals:   12/09/16 0930 12/09/16 0940  BP: (!) 111/94 110/63  Pulse: 72 72  Resp: 20 20  Temp: 36.9 C     Last Pain:  Vitals:   12/09/16 0930  TempSrc: Oral  PainSc:                  Motley S

## 2016-12-09 NOTE — Progress Notes (Signed)
Gretta Arab to be D/C'd Home per MD order.  Discussed with the patient and all questions fully answered.  VSS, Skin clean, dry and intact without evidence of skin break down, no evidence of skin tears noted. IV catheter discontinued intact. Site without signs and symptoms of complications. Dressing and pressure applied.  An After Visit Summary was printed and given to the patient. Patient received prescription.  D/c education completed with patient/family including follow up instructions, medication list, d/c activities limitations if indicated, with other d/c instructions as indicated by MD - patient able to verbalize understanding, all questions fully answered.   Patient instructed to return to ED, call 911, or call MD for any changes in condition.   Patient escorted via [place], and D/C home via private auto.  Dorris Carnes 12/09/2016 2:12 PM

## 2016-12-09 NOTE — Transfer of Care (Signed)
Immediate Anesthesia Transfer of Care Note  Patient: [name]  Procedure(s) Performed: Procedure(s): TRANSESOPHAGEAL ECHOCARDIOGRAM (TEE) (N/A) CARDIOVERSION (N/A)  Patient Location: PACU  Anesthesia Type:General  Level of Consciousness: awake, alert  and oriented  Airway & Oxygen Therapy: Patient Spontanous Breathing and Patient connected to face mask oxygen  Post-op Assessment: Report given to RN, Post -op Vital signs reviewed and stable and Patient moving all extremities  Post vital signs: Reviewed and stable  Last Vitals:  Vitals:   12/09/16 0824 12/09/16 0930  BP: (!) 86/67 (!) 111/94  Pulse: (!) 126 72  Resp: 17 20  Temp: 36.6 C 36.9 C    Last Pain:  Vitals:   12/09/16 0930  TempSrc: Oral  PainSc:       Patients Stated Pain Goal: 2 (40/37/54 3606)  Complications: No apparent anesthesia complications

## 2016-12-09 NOTE — Progress Notes (Signed)
ANTICOAGULATION CONSULT NOTE - Follow Up Consult  Pharmacy Consult for Heparin >> Coumadin Indication: atrial fibrillation  Allergies  Allergen Reactions  . Diphenhydramine Itching    Only with IV doses. Tolerates oral.  . Penicillins Other (See Comments)    Migraine Has patient had a PCN reaction causing immediate rash, facial/tongue/throat swelling, SOB or lightheadedness with hypotension: no Has patient had a PCN reaction causing severe rash involving mucus membranes or skin necrosis: No Has patient had a PCN reaction that required hospitalization No Has patient had a PCN reaction occurring within the last 10 years: No If all of the above answers are "NO", then may proceed with Cephalosporin use.  . Bupropion Other (See Comments)    Caused anxiety  . Adhesive [Tape] Rash    Please use paper tape  . Amoxicillin Rash    migraine  . Ativan [Lorazepam] Anxiety  . Other Rash    Please use paper tape    Patient Measurements: Height: '5\' 10"'$  (177.8 cm) Weight: 174 lb 2.6 oz (79 kg) IBW/kg (Calculated) : 73 Heparin Dosing Weight: 80 kg  Vital Signs: Temp: 98.5 F (36.9 C) (04/05 0930) Temp Source: Oral (04/05 0930) BP: 110/63 (04/05 0940) Pulse Rate: 72 (04/05 0940)  Labs:  Recent Labs  12/06/16 1352  12/07/16 0558  12/08/16 0807 12/08/16 1533 12/09/16 0526  HGB  --   < > 12.7*  --  13.0  --  12.2*  HCT  --   --  39.4  --  41.2  --  38.9*  PLT  --   --  98*  --  107*  --  103*  LABPROT  --   --  22.5*  --  26.3*  --  30.5*  INR  --   --  1.95  --  2.36  --  2.84  HEPARINUNFRC  --   --   --   < > 0.35 0.35 0.27*  CREATININE  --   --  6.91*  --  8.81*  --   --   TROPONINI 0.11*  --   --   --   --   --   --   < > = values in this interval not displayed.  Estimated Creatinine Clearance: 9.1 mL/min (A) (by C-G formula based on SCr of 8.81 mg/dL (H)).   Medications:  Scheduled:  . amiodarone  200 mg Oral BID  . atorvastatin  20 mg Oral QHS  . calcium acetate   4,002 mg Oral TID WC  . citalopram  20 mg Oral QHS  . clopidogrel  75 mg Oral Daily  . docusate sodium  400 mg Oral BID  . doxycycline  100 mg Oral Q12H  . famotidine  20 mg Oral Daily  . ferric gluconate (FERRLECIT/NULECIT) IV  125 mg Intravenous Q Wed-HD  . folic acid  1 mg Oral Daily  . gabapentin  300 mg Oral QHS  . guaiFENesin  600 mg Oral BID  . ipratropium-albuterol  3 mL Nebulization TID  . mouth rinse  15 mL Mouth Rinse BID  . midodrine  10 mg Oral Once per day on Mon Wed Fri  . mometasone-formoterol  2 puff Inhalation BID  . multivitamin  1 tablet Oral QHS  . pantoprazole  40 mg Oral Daily  . predniSONE  50 mg Oral Q breakfast  . warfarin  3.75 mg Oral q1800  . Warfarin - Pharmacist Dosing Inpatient   Does not apply q1800   Infusions:  Assessment: 61 yo M admitted with SOB/volume overload, found to be in afib.  Pt on Coumadin PTA for hx afib, s/p DCCV in March.  INR was subtherapeutic so pt also started on heparin.  TEE/DCCV today (4/5).  INR therapeutic this AM.  Heparin discontinued.    Goal of Therapy:  INR 2-3 Heparin level 0.3-0.7 units/ml Monitor platelets by anticoagulation protocol: Yes   Plan:  Continue Coumadin 3.'75mg'$  PO daily CBC and INR in AM.   Horton Chin, Pharm.D., BCPS Clinical Pharmacist Pager [phone] 12/09/2016 10:55 AM

## 2016-12-09 NOTE — Anesthesia Preprocedure Evaluation (Signed)
Anesthesia Evaluation  Patient identified by MRN, date of birth, ID band Patient awake    Reviewed: Allergy & Precautions, H&P , NPO status , Patient's Chart, lab work & pertinent test results  Airway Mallampati: II   Neck ROM: full    Dental   Pulmonary shortness of breath, sleep apnea , COPD, former smoker,    breath sounds clear to auscultation       Cardiovascular hypertension, + CAD, + Past MI, + CABG, + Peripheral Vascular Disease and +CHF  + dysrhythmias Atrial Fibrillation + Valvular Problems/Murmurs  Rhythm:irregular Rate:Tachycardia  s/p AVR   Neuro/Psych    GI/Hepatic   Endo/Other    Renal/GU ESRF and DialysisRenal disease     Musculoskeletal   Abdominal   Peds  Hematology   Anesthesia Other Findings   Reproductive/Obstetrics                             Anesthesia Physical Anesthesia Plan  ASA: IV  Anesthesia Plan: MAC   Post-op Pain Management:    Induction: Intravenous  Airway Management Planned: Nasal Cannula  Additional Equipment:   Intra-op Plan:   Post-operative Plan:   Informed Consent: I have reviewed the patients History and Physical, chart, labs and discussed the procedure including the risks, benefits and alternatives for the proposed anesthesia with the patient or authorized representative who has indicated his/her understanding and acceptance.     Plan Discussed with: CRNA, Anesthesiologist and Surgeon  Anesthesia Plan Comments:         Anesthesia Quick Evaluation

## 2016-12-09 NOTE — Op Note (Signed)
INDICATIONS: atrial flutter  PROCEDURE:   Informed consent was obtained prior to the procedure. The risks, benefits and alternatives for the procedure were discussed and the patient comprehended these risks.  Risks include, but are not limited to, cough, sore throat, vomiting, nausea, somnolence, esophageal and stomach trauma or perforation, bleeding, low blood pressure, aspiration, pneumonia, infection, trauma to the teeth and death.    After a procedural time-out, the oropharynx was anesthetized with 20% benzocaine spray.   During this procedure the patient was administered IV etomidate and propofol by Anesthesiology, Dr. Marcie Bal.  The transesophageal probe was inserted in the esophagus and stomach without difficulty and multiple views were obtained.  The patient was kept under observation until the patient left the procedure room.  The patient left the procedure room in stable condition.   Agitated microbubble saline contrast was not administered.  COMPLICATIONS:    There were no immediate complications.  FINDINGS:  No LA thrombus. Severe MR. LVEF 45-50% with prominent paradoxical septal motion. Normal functiomn of the AV prosthesis.  RECOMMENDATIONS:     proceed with cardioversion.  Time Spent Directly with the Patient:  30 minutes   [name] 12/09/2016, 9:17 AM

## 2016-12-09 NOTE — Op Note (Signed)
Procedure: Electrical Cardioversion Indications:  Atrial Flutter  Procedure Details:  Consent: Risks of procedure as well as the alternatives and risks of each were explained to the (patient/caregiver).  Consent for procedure obtained.  Time Out: Verified patient identification, verified procedure, site/side was marked, verified correct patient position, special equipment/implants available, medications/allergies/relevent history reviewed, required imaging and test results available.  Performed  Patient placed on cardiac monitor, pulse oximetry, supplemental oxygen as necessary.  Sedation given: Etomidate and propofol, Dr. Marcie Bal, Anesthesiology Pacer pads placed anterior and posterior chest.  Cardioverted 1 time(s).  Cardioversion with synchronized biphasic 120J shock.  Evaluation: Findings: Post procedure EKG shows: NSR Complications: None Patient did tolerate procedure well.  Time Spent Directly with the Patient:  30 minutes   [name] 12/09/2016, 9:19 AM

## 2016-12-09 NOTE — Progress Notes (Signed)
[facility] Progress Note   Subjective: no c/o's, breathing better.  Down to 79kg now.  Had DCCV/ TEE this am. NSR post procedure.  Vitals:   12/09/16 0530 12/09/16 0824 12/09/16 0930 12/09/16 0940  BP:  (!) 86/67 (!) 111/94 110/63  Pulse: (!) 124 (!) 126 72 72  Resp:  '17 20 20  '$ Temp:  97.8 F (36.6 C) 98.5 F (36.9 C)   TempSrc:  Oral Oral   SpO2: 100% 98% 99% 98%  Weight:  79 kg (174 lb 2.6 oz)    Height:  '5\' 10"'$  (1.778 m)      Inpatient medications: . amiodarone  200 mg Oral BID  . atorvastatin  20 mg Oral QHS  . calcium acetate  4,002 mg Oral TID WC  . citalopram  20 mg Oral QHS  . clopidogrel  75 mg Oral Daily  . docusate sodium  400 mg Oral BID  . doxycycline  100 mg Oral Q12H  . famotidine  20 mg Oral Daily  . ferric gluconate (FERRLECIT/NULECIT) IV  125 mg Intravenous Q Wed-HD  . folic acid  1 mg Oral Daily  . gabapentin  300 mg Oral QHS  . guaiFENesin  600 mg Oral BID  . ipratropium-albuterol  3 mL Nebulization TID  . mouth rinse  15 mL Mouth Rinse BID  . midodrine  10 mg Oral Once per day on Mon Wed Fri  . mometasone-formoterol  2 puff Inhalation BID  . multivitamin  1 tablet Oral QHS  . pantoprazole  40 mg Oral Daily  . predniSONE  50 mg Oral Q breakfast  . warfarin  3.75 mg Oral q1800  . Warfarin - Pharmacist Dosing Inpatient   Does not apply q1800    acetaminophen **OR** acetaminophen, levalbuterol, nitroGLYCERIN, ondansetron **OR** ondansetron (ZOFRAN) IV  Exam: Alert, nasal O2 , WDWN, no distress No jvd Chest no sig rales today, improved Cor tachy, no S3 Abd soft ntnd no ascites Ext no sig edema NF Ox 3 Left AVF +bruit  Dialysis: MWF [facility]   4h   160  450/800  81kg    2/2.25 bath  LFA AVF   Hep 2000 - Venofer 50/wk - no ESA or VDRA - last Hb 12.9, tsat 30%, adj Ca 8.9  P 5 and pth 14 (s/p PTX),; 6 phoslo ac      Assessment: 1. Acute pulm edema / acute on chron resp distress/ vol excess - resolved 2. Atrial flutter - on amio  po, sp DCCV today. In NSR. On coumadin w INR in range.   3. Chronic lung disease - on 4L [place] at home 4. Vol overload - improved, lowering dry wt to 79kg at dc. Cont to push edw down at OP HD unit gradually as tolerated. Cont midodrine pre HD.  5. ESRD MWF HD 6. Chron hypotens on midodrine pre HD 7. Anemia of CKD - not on ESA 8. MBD/ CKD - cont binders, sp ptx 9. CAD hx CABG/ PCI 2016 [facility]) - recent STEMI d/t occlusion of SVG-LAD.   10. Hx bioprosthetic AVR 11. Dispo - stable for dc from renal standpoitn, defer to primary   Plan - HD Friday it still here.    Kelly Splinter MD [facility] pager [phone]   12/09/2016, 1:59 PM    Recent Labs Lab 12/06/16 0940 12/07/16 0558 12/08/16 0807  NA 136 135 138  K 4.1 5.1 5.0  CL 96* 95* 95*  CO2 '27 26 24  '$ GLUCOSE 103*  96 93  BUN 22* 48* 71*  CREATININE 3.89* 6.91* 8.81*  CALCIUM 8.5* 8.5* 8.9    Recent Labs Lab 12/05/16 2257 12/06/16 0940  AST 20 22  ALT 37 38  ALKPHOS 121 119  BILITOT 1.1 0.9  PROT 6.9 7.7  ALBUMIN 3.2* 3.5    Recent Labs Lab 12/07/16 0558 12/08/16 0807 12/09/16 0526  WBC 9.5 12.5* 9.9  HGB 12.7* 13.0 12.2*  HCT 39.4 41.2 38.9*  MCV 106.8* 107.3* 106.6*  PLT 98* 107* 103*   Iron/TIBC/Ferritin/ %Sat No results found for: IRON, TIBC, FERRITIN, IRONPCTSAT

## 2016-12-09 NOTE — Discharge Summary (Signed)
Physician Discharge Summary  [name] NLG:[id number] DOB: 10-26-1955 DOA: 12/05/2016  PCP: Teressa Lower, MD  Admit date: 12/05/2016 Discharge date: 12/09/2016  Admitted From: Home Disposition: Home  Recommendations for Outpatient Follow-up:  1. Follow up with PCP in 1 week 2. Follow up with Dr. Angelena Form 3. Dry weight is 79kg 4. PPI started for dysphagia/reflux 5. DC cardioversion performed on 12/09/2016  Discharge Condition: Stable CODE STATUS: Full code Diet recommendation: Renal diet   Brief/Interim Summary:  Admission HPI written by Rise Patience, MD   Chief Complaint: Shortness of breath.  HPI: [name] is a 61 y.o. 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 Patient states since his discharge after being treated for an influenza patient has been having persistent shortness of breath. But last evening patient became acutely short of breath. Denies any chest pain. Has been having some nonproductive cough.  ED Course: In the ER chest x-ray shows features concerning for pulmonary edema. On call nephrologist Dr. Justin Mend has been consulted. Patient is being admitted for acute respiratory failure likely from acute pulmonary edema. Patient had followed up with his cardiologist 2 days ago and his amiodarone dose was increased to twice a day. Patient's heart rate is mildly elevated at around 120 bpm.    Hospital course:  Acute on chronic hypoxemic respiratory failure Patient uses a baseline oxygen rate of 4L via nasal canula. Oxygen requirement increased secondary to pulmonary  edema and COPD exacerbation. Back to baseline with treatment.  Acute COPD exacerbation Duoneb, Xopenex, and Dulera in addition to prednisone and doxycycline for treatment. Improved. Continue prednisone, doxycycline and inhalers at discharge.  ESRD on HD Nephrology managed patient's dialysis which helped pulmonary edema.  Atrial tachycardia vs slow atrial flutter Cardiology performed TEE DCCV on 4/5 with return to normal sinus rhythm. No thrombus seen on TEE. EF of 40-45%. Continued amiodarone and coumadin (coumadin bridged with heparin).  Dysphagia Barium swallow unremarkable. Started on PPI.   CAD s/p CABG No chest pain. Continued Plavix and statin  Multiple sclerosis Stable  Discharge Diagnoses:  Principal Problem:   Acute pulmonary edema (HCC) Active Problems:   Hypotension   Multiple sclerosis (HCC)   OSA -not compliant with C-pap   ESRD (end stage renal disease) on dialysis ([place])   Typical atrial flutter ([place])   History of tissue AVR 2014   Hx of CABG x 2 2014    Discharge Instructions  Discharge Instructions    (HEART FAILURE PATIENTS) Call MD:  Anytime you have any of the following symptoms: 1) 3 pound weight gain in 24 hours or 5 pounds in 1 week 2) shortness of breath, with or without a dry hacking cough 3) swelling in the hands, feet or stomach 4) if you have to sleep on extra pillows at night in order to breathe.    Complete by:  As directed    Call MD for:  difficulty breathing, headache or visual disturbances    Complete by:  As directed    Call MD for:  extreme fatigue    Complete by:  As directed  Call MD for:  persistant dizziness or light-headedness    Complete by:  As directed    Call MD for:  persistant nausea and vomiting    Complete by:  As directed    Diet - low sodium heart healthy    Complete by:  As directed    Increase activity slowly    Complete by:  As directed      Allergies as of 12/09/2016      Reactions   Diphenhydramine  Itching   Only with IV doses. Tolerates oral.   Penicillins Other (See Comments)   Migraine Has patient had a PCN reaction causing immediate rash, facial/tongue/throat swelling, SOB or lightheadedness with hypotension: no Has patient had a PCN reaction causing severe rash involving mucus membranes or skin necrosis: No Has patient had a PCN reaction that required hospitalization No Has patient had a PCN reaction occurring within the last 10 years: No If all of the above answers are "NO", then may proceed with Cephalosporin use.   Bupropion Other (See Comments)   Caused anxiety   Adhesive [tape] Rash   Please use paper tape   Amoxicillin Rash   migraine   Ativan [lorazepam] Anxiety   Other Rash   Please use paper tape      Medication List    TAKE these medications   acetaminophen 500 MG tablet Commonly known as:  TYLENOL Take 1,000 mg by mouth every 6 (six) hours as needed for pain or fever.   amiodarone 200 MG tablet Commonly known as:  PACERONE Take 1 tablet (200 mg total) by mouth 2 (two) times daily.   atorvastatin 40 MG tablet Commonly known as:  LIPITOR Take 20 mg by mouth at bedtime.   calcium acetate 667 MG capsule Commonly known as:  PHOSLO Take 4,002 mg by mouth 3 (three) times daily with meals.   citalopram 20 MG tablet Commonly known as:  CELEXA Take 20 mg by mouth at bedtime.   clopidogrel 75 MG tablet Commonly known as:  PLAVIX Take 1 tablet (75 mg total) by mouth daily.   docusate sodium 100 MG capsule Commonly known as:  COLACE Take 400 mg by mouth 2 (two) times daily.   doxycycline 100 MG tablet Commonly known as:  VIBRA-TABS Take 1 tablet (100 mg total) by mouth every 12 (twelve) hours.   folic acid 1 MG tablet Commonly known as:  FOLVITE Take 1 mg by mouth daily.   gabapentin 300 MG capsule Commonly known as:  NEURONTIN Take 300 mg by mouth at bedtime.   guaiFENesin 600 MG 12 hr tablet Commonly known as:  MUCINEX Take 1 tablet (600 mg  total) by mouth 2 (two) times daily.   ipratropium-albuterol 0.5-2.5 (3) MG/3ML Soln Commonly known as:  DUONEB Take 3 mLs by nebulization every 4 (four) hours as needed. What changed:  when to take this  additional instructions   midodrine 10 MG tablet Commonly known as:  PROAMATINE Take 1 tablet (10 mg total) by mouth every Monday, Wednesday, and Friday with hemodialysis. What changed:  when to take this  additional instructions   multivitamin Tabs tablet Take 1 tablet by mouth daily.   nitroGLYCERIN 0.4 MG SL tablet Commonly known as:  NITROSTAT Place 0.4 mg under the tongue every 5 (five) minutes as needed for chest pain (max 3 doses - if no relief call MD).   OXYGEN Inhale 4 L into the lungs continuous.   pantoprazole 40 MG tablet Commonly known as:  PROTONIX  Take 1 tablet (40 mg total) by mouth daily. Start taking on:  12/10/2016   polyethylene glycol packet Commonly known as:  MIRALAX / GLYCOLAX Take 17 g by mouth at bedtime. Mix in 8 oz liquid and drink   predniSONE 20 MG tablet Commonly known as:  DELTASONE Take '60mg'$  x 2 days, '40mg'$  x 2 days, '20mg'$  x 3 days   PROAIR HFA 108 (90 Base) MCG/ACT inhaler Generic drug:  albuterol Inhale 1-2 puffs into the lungs every 6 (six) hours as needed for shortness of breath.   ranitidine 150 MG tablet Commonly known as:  ZANTAC Take 150 mg by mouth daily.   sodium polystyrene 15 GM/60ML suspension Commonly known as:  KAYEXALATE Take 15 g by mouth See admin instructions. Take 60 ml (15 g) by mouth in the morning on non-dialysis days - Sunday, Tuesday, Thursday, Saturday   warfarin 7.5 MG tablet Commonly known as:  COUMADIN Take 1/2-1 tablet by mouth daily as directed by coumadin clinic What changed:  See the new instructions.      Follow-up Information    DOUGH,ROBERT, MD. Schedule an appointment as soon as possible for a visit in 1 week(s).   Specialty:  Family Medicine Contact information: [address] [place] [address] [phone]        Lauree Chandler, MD. Schedule an appointment as soon as possible for a visit in 1 week(s).   Specialty:  Cardiology Contact information: [address] [place] [address] [phone]          Allergies  Allergen Reactions  . Diphenhydramine Itching    Only with IV doses. Tolerates oral.  . Penicillins Other (See Comments)    Migraine Has patient had a PCN reaction causing immediate rash, facial/tongue/throat swelling, SOB or lightheadedness with hypotension: no Has patient had a PCN reaction causing severe rash involving mucus membranes or skin necrosis: No Has patient had a PCN reaction that required hospitalization No Has patient had a PCN reaction occurring within the last 10 years: No If all of the above answers are "NO", then may proceed with Cephalosporin use.  . Bupropion Other (See Comments)    Caused anxiety  . Adhesive [Tape] Rash    Please use paper tape  . Amoxicillin Rash    migraine  . Ativan [Lorazepam] Anxiety  . Other Rash    Please use paper tape    Consultations:  Cardiology  Nephrology   Procedures/Studies: Dg Chest 2 View  Result Date: 12/05/2016 CLINICAL DATA:  Worsening dyspnea for 2 days. EXAM: CHEST  2 VIEW COMPARISON:  11/15/2016 FINDINGS: Extensive interstitial fluid or thickening. No confluent airspace consolidation. Small right effusion, extending into the fissures. Stable cardiomegaly.  Prior sternotomy and aortic valvuloplasty. IMPRESSION: Extensive interstitial fluid and small right pleural effusion. No focal airspace consolidation. Stable cardiomegaly. Electronically Signed   By: Andreas Newport M.D.   On: 12/05/2016 22:25   Dg Chest 2 View  Result Date: 11/15/2016 CLINICAL DATA:  Productive cough, shortness of breath for 3 days EXAM: CHEST  2 VIEW COMPARISON:  08/04/2016 FINDINGS: Bilateral diffuse interstitial thickening. Trace right pleural effusion. No  pneumothorax. Stable cardiomegaly. Prior CABG. Prior aortic bowel replacement. No acute osseous abnormality. IMPRESSION: Cardiomegaly with mild pulmonary vascular congestion. Electronically Signed   By: Kathreen Devoid   On: 11/15/2016 16:50   Dg Esophagus  Result Date: 12/07/2016 CLINICAL DATA:  Difficulty swallowing in a scheduled for transesophageal echocardiogram EXAM: ESOPHOGRAM/BARIUM SWALLOW TECHNIQUE: Single contrast examination was  performed using  thin barium. FLUOROSCOPY TIME:  Fluoroscopy Time:  0 minutes and 42 seconds. Radiation Exposure Index (if provided by the fluoroscopic device): 4.1 mGy Number of Acquired Spot Images: 0 COMPARISON:  None. FINDINGS: Due to patient relative immobility , single contrast study was performed. Fluoro table was placed in a 45 degree upright position and the patient was turned LPO relative to the fluoro table. He was then given thin barium to drink and monitored fluoroscopically. There is no evidence for esophageal mass or stricture. No esophageal diverticulum. 13 mm barium tablet passed readily into the stomach when taken with water. IMPRESSION: 1. No evidence for esophageal narrowing or stricture. Electronically Signed   By: Misty Stanley M.D.   On: 12/07/2016 14:21   Dg Chest Port 1 View  Result Date: 12/08/2016 CLINICAL DATA:  Shortness of breath, evaluate for pulmonary edema EXAM: PORTABLE CHEST 1 VIEW COMPARISON:  12/06/2016 FINDINGS: Post sternotomy changes with fracture through for sternal wire as before, wire fragment superior to the medial end of right clavicle. There is mild cardiomegaly. No overt edema. Possible tiny right effusion. Aortic atherosclerosis. IMPRESSION: Mild cardiomegaly without overt failure. Possible tiny right pleural effusion. Electronically Signed   By: Donavan Foil M.D.   On: 12/08/2016 18:18   Dg Chest Port 1 View  Result Date: 12/06/2016 CLINICAL DATA:  Follow-up pulmonary edema EXAM: PORTABLE CHEST 1 VIEW COMPARISON:  PA and  lateral chest x-ray of December 05, 2016 FINDINGS: The lungs are well-expanded. The interstitial markings remain prominent but are less conspicuous today. There is a small amount of fluid in the minor fissure. The cardiac silhouette remains enlarged. The pulmonary vascularity remains engorged. There is calcification in the wall of the aortic arch. The patient has undergone previous median sternotomy. Curvature of the lower thoracic spine is new since yesterday's study and likely reflects patient positioning. IMPRESSION: Mild improvement in pulmonary interstitial edema. No alveolar pneumonia. Stable cardiomegaly with central pulmonary vascular congestion. Thoracic aortic atherosclerosis. Electronically Signed   By: David  Martinique M.D.   On: 12/06/2016 15:30      Subjective: Patient reports no chest pain or dyspnea. Has some productive coughing. No palpitations.  Discharge Exam: Vitals:   12/09/16 0930 12/09/16 0940  BP: (!) 111/94 110/63  Pulse: 72 72  Resp: 20 20  Temp: 98.5 F (36.9 C)    Vitals:   12/09/16 0530 12/09/16 0824 12/09/16 0930 12/09/16 0940  BP:  (!) 86/67 (!) 111/94 110/63  Pulse: (!) 124 (!) 126 72 72  Resp:  '17 20 20  '$ Temp:  97.8 F (36.6 C) 98.5 F (36.9 C)   TempSrc:  Oral Oral   SpO2: 100% 98% 99% 98%  Weight:  79 kg (174 lb 2.6 oz)    Height:  '5\' 10"'$  (1.778 m)      General: Pt is alert, awake, not in acute distress Cardiovascular: RRR, S1/S2 +, no rubs, no gallops Respiratory: CTA bilaterally, no wheezing, no rhonchi Abdominal: Soft, NT, ND, bowel sounds + Extremities: no edema, no cyanosis    The results of significant diagnostics from this hospitalization (including imaging, microbiology, ancillary and laboratory) are listed below for reference.     Microbiology:  Labs: BNP (last 3 results)  Recent Labs  07/09/16 0759 11/15/16 1550 12/05/16 2257  BNP 2,414.3* 1,968.8* 9,983.3*   Basic Metabolic Panel:  Recent Labs Lab 12/05/16 2134  12/05/16 2152 12/05/16 2257 12/06/16 0940 12/07/16 0558 12/08/16 0807  NA 139 137  --  136 135  138  K 6.1* 5.9*  --  4.1 5.1 5.0  CL 96* 101  --  96* 95* 95*  CO2 25  --   --  '27 26 24  '$ GLUCOSE 120* 120*  --  103* 96 93  BUN 73* 67*  --  22* 48* 71*  CREATININE 8.90* 9.50*  --  3.89* 6.91* 8.81*  CALCIUM 7.8*  --   --  8.5* 8.5* 8.9  MG  --   --  2.3  --  2.1 2.4   Liver Function Tests:  Recent Labs Lab 12/05/16 2257 12/06/16 0940  AST 20 22  ALT 37 38  ALKPHOS 121 119  BILITOT 1.1 0.9  PROT 6.9 7.7  ALBUMIN 3.2* 3.5    Recent Labs Lab 12/05/16 2257  LIPASE 39   CBC:  Recent Labs Lab 12/05/16 2134 12/05/16 2152 12/06/16 0940 12/07/16 0558 12/08/16 0807 12/09/16 0526  WBC 9.4  --  14.1* 9.5 12.5* 9.9  HGB 12.9* 13.6 12.7* 12.7* 13.0 12.2*  HCT 40.2 40.0 39.5 39.4 41.2 38.9*  MCV 106.3*  --  106.8* 106.8* 107.3* 106.6*  PLT 95*  --  96* 98* 107* 103*   Cardiac Enzymes:  Recent Labs Lab 12/06/16 0229 12/06/16 0940 12/06/16 1352  TROPONINI 0.16* 0.16* 0.11*    Time coordinating discharge: Over 30 minutes  SIGNED:   Cordelia Poche, MD [facility] 12/09/2016, 1:56 PM Pager [phone]  If 7PM-7AM, please contact night-coverage [email or web address] Password [email or web address]

## 2016-12-10 ENCOUNTER — Ambulatory Visit (INDEPENDENT_AMBULATORY_CARE_PROVIDER_SITE_OTHER): Payer: Medicare Other | Admitting: *Deleted

## 2016-12-10 DIAGNOSIS — I483 Typical atrial flutter: Secondary | ICD-10-CM

## 2016-12-10 DIAGNOSIS — D509 Iron deficiency anemia, unspecified: Secondary | ICD-10-CM | POA: Diagnosis not present

## 2016-12-10 DIAGNOSIS — I4891 Unspecified atrial fibrillation: Secondary | ICD-10-CM | POA: Diagnosis not present

## 2016-12-10 DIAGNOSIS — Z5181 Encounter for therapeutic drug level monitoring: Secondary | ICD-10-CM | POA: Diagnosis not present

## 2016-12-10 DIAGNOSIS — I2510 Atherosclerotic heart disease of native coronary artery without angina pectoris: Secondary | ICD-10-CM

## 2016-12-10 DIAGNOSIS — I4892 Unspecified atrial flutter: Secondary | ICD-10-CM | POA: Diagnosis not present

## 2016-12-10 DIAGNOSIS — E875 Hyperkalemia: Secondary | ICD-10-CM | POA: Diagnosis not present

## 2016-12-10 DIAGNOSIS — Z7901 Long term (current) use of anticoagulants: Secondary | ICD-10-CM

## 2016-12-10 DIAGNOSIS — Z9861 Coronary angioplasty status: Secondary | ICD-10-CM

## 2016-12-10 DIAGNOSIS — I214 Non-ST elevation (NSTEMI) myocardial infarction: Secondary | ICD-10-CM | POA: Diagnosis not present

## 2016-12-10 DIAGNOSIS — N2581 Secondary hyperparathyroidism of renal origin: Secondary | ICD-10-CM | POA: Diagnosis not present

## 2016-12-10 DIAGNOSIS — N186 End stage renal disease: Secondary | ICD-10-CM | POA: Diagnosis not present

## 2016-12-10 LAB — POCT INR: INR: 2.9

## 2016-12-13 DIAGNOSIS — E875 Hyperkalemia: Secondary | ICD-10-CM | POA: Diagnosis not present

## 2016-12-13 DIAGNOSIS — D509 Iron deficiency anemia, unspecified: Secondary | ICD-10-CM | POA: Diagnosis not present

## 2016-12-13 DIAGNOSIS — N186 End stage renal disease: Secondary | ICD-10-CM | POA: Diagnosis not present

## 2016-12-13 DIAGNOSIS — N2581 Secondary hyperparathyroidism of renal origin: Secondary | ICD-10-CM | POA: Diagnosis not present

## 2016-12-14 ENCOUNTER — Encounter (HOSPITAL_COMMUNITY): Payer: Self-pay | Admitting: Nurse Practitioner

## 2016-12-14 ENCOUNTER — Ambulatory Visit (HOSPITAL_COMMUNITY)
Admission: RE | Admit: 2016-12-14 | Discharge: 2016-12-14 | Disposition: A | Discharge status: Home / Self Care | Payer: Medicare Other | Source: Ambulatory Visit | Attending: Nurse Practitioner | Admitting: Nurse Practitioner

## 2016-12-14 ENCOUNTER — Ambulatory Visit (INDEPENDENT_AMBULATORY_CARE_PROVIDER_SITE_OTHER): Payer: Medicare Other | Admitting: *Deleted

## 2016-12-14 VITALS — BP 92/66 | HR 123 | Ht 70.0 in | Wt 172.6 lb

## 2016-12-14 DIAGNOSIS — I483 Typical atrial flutter: Secondary | ICD-10-CM

## 2016-12-14 DIAGNOSIS — G4733 Obstructive sleep apnea (adult) (pediatric): Secondary | ICD-10-CM | POA: Diagnosis not present

## 2016-12-14 DIAGNOSIS — Z5181 Encounter for therapeutic drug level monitoring: Secondary | ICD-10-CM

## 2016-12-14 DIAGNOSIS — I959 Hypotension, unspecified: Secondary | ICD-10-CM | POA: Insufficient documentation

## 2016-12-14 DIAGNOSIS — I4891 Unspecified atrial fibrillation: Secondary | ICD-10-CM

## 2016-12-14 DIAGNOSIS — Z880 Allergy status to penicillin: Secondary | ICD-10-CM | POA: Insufficient documentation

## 2016-12-14 DIAGNOSIS — Z9981 Dependence on supplemental oxygen: Secondary | ICD-10-CM | POA: Diagnosis not present

## 2016-12-14 DIAGNOSIS — Z8249 Family history of ischemic heart disease and other diseases of the circulatory system: Secondary | ICD-10-CM | POA: Insufficient documentation

## 2016-12-14 DIAGNOSIS — Z9861 Coronary angioplasty status: Secondary | ICD-10-CM

## 2016-12-14 DIAGNOSIS — G35 Multiple sclerosis: Secondary | ICD-10-CM | POA: Diagnosis not present

## 2016-12-14 DIAGNOSIS — Z992 Dependence on renal dialysis: Secondary | ICD-10-CM | POA: Insufficient documentation

## 2016-12-14 DIAGNOSIS — Z953 Presence of xenogenic heart valve: Secondary | ICD-10-CM | POA: Diagnosis not present

## 2016-12-14 DIAGNOSIS — J9610 Chronic respiratory failure, unspecified whether with hypoxia or hypercapnia: Secondary | ICD-10-CM | POA: Insufficient documentation

## 2016-12-14 DIAGNOSIS — Z7901 Long term (current) use of anticoagulants: Secondary | ICD-10-CM | POA: Diagnosis not present

## 2016-12-14 DIAGNOSIS — I4892 Unspecified atrial flutter: Secondary | ICD-10-CM

## 2016-12-14 DIAGNOSIS — J449 Chronic obstructive pulmonary disease, unspecified: Secondary | ICD-10-CM | POA: Diagnosis not present

## 2016-12-14 DIAGNOSIS — Z940 Kidney transplant status: Secondary | ICD-10-CM | POA: Insufficient documentation

## 2016-12-14 DIAGNOSIS — N186 End stage renal disease: Secondary | ICD-10-CM | POA: Diagnosis not present

## 2016-12-14 DIAGNOSIS — Z951 Presence of aortocoronary bypass graft: Secondary | ICD-10-CM | POA: Insufficient documentation

## 2016-12-14 DIAGNOSIS — I214 Non-ST elevation (NSTEMI) myocardial infarction: Secondary | ICD-10-CM | POA: Diagnosis not present

## 2016-12-14 DIAGNOSIS — I480 Paroxysmal atrial fibrillation: Secondary | ICD-10-CM | POA: Insufficient documentation

## 2016-12-14 DIAGNOSIS — I120 Hypertensive chronic kidney disease with stage 5 chronic kidney disease or end stage renal disease: Secondary | ICD-10-CM | POA: Insufficient documentation

## 2016-12-14 DIAGNOSIS — I2510 Atherosclerotic heart disease of native coronary artery without angina pectoris: Secondary | ICD-10-CM | POA: Diagnosis not present

## 2016-12-14 DIAGNOSIS — I739 Peripheral vascular disease, unspecified: Secondary | ICD-10-CM | POA: Insufficient documentation

## 2016-12-14 DIAGNOSIS — Z79899 Other long term (current) drug therapy: Secondary | ICD-10-CM | POA: Insufficient documentation

## 2016-12-14 DIAGNOSIS — Z87891 Personal history of nicotine dependence: Secondary | ICD-10-CM | POA: Diagnosis not present

## 2016-12-14 LAB — POCT INR: INR: 2

## 2016-12-14 NOTE — Progress Notes (Signed)
Primary Care Physician: Teressa Lower, MD Referring Physician: Cecilie Kicks, NP  Cardiologist: Dr. Julianne Handler, MD   Korver Graybeal is a 61 y.o. 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 He was placed on amiodarone and ultimately underwent DCCV on 07/12/16 with return to NSR. It was recommended he continue amiodarone '400mg'$  BID x 2 weeks, then '200mg'$  BID x 2 weeks, then '200mg'$  daily. He also went home requiring home oxygen at rest as well as with ambulation and at night.[name] Hospital course otherwise notable for hypotension during most of his stay which improved post-DCCV. Labs notable for normal TSH, Hgb 11.2, LFTs OK  He was seen by Cecilie Kicks 1/5 for c/o  faster HR and he had not been feeling well from the day before. His HR was elevated in dialysis and BP stable. EKG with RBBB QTC 513 ms in afib with RVR.  Also please note his celexa was stopped on amiodarone due to prolonging QTC but pt had been concerned that his anxiety would return so taking 10 mg daily.Welbutrin 150 mg BID was added. Amiodarone was increased to 200 mg bid.  He is in the afib clinic for f/u 1/11. He is in SR. He states that he felt terrible over the weekend being placed on wellbutrin and coming off celexa, and having amiodarone increased.[name] He did not know what was  making him feel bad so he stopped wellbutrin and decreased amiodarone back to 200 mg a day. He really would like to be back on clelexa. I reviewed his ekg's and when he is in SR the qtc is in range. With his BBB, and in afib his QTC is prolonged which is still acceptable up to 600 ms on amioderone. I also discussed with pharmacist and Dr. Rayann Heman and is ok for pt to resume celexa but will restart at 10 mg and check EKG in one week . If qtc is ok, he can resume at 20 mg daily.  Pt is in the afib clinic 2/21 for c/o of elevated heart rate since last week with low blood pressures and has not been able to take metoprolol to help with tachycardia. He did not feel well over the weekend but feels decent today. He continues with dialysis M/W/F. EKG shows 2:1 atrial flutter. He has been taking an extra 200 mg  Amiodarone in the pm since last Friday.  F/u in afib clinc 11/11/16. He continues in aflutter. His BP readings at dialysis have been low since being in irregular rhythm. He has noted some blurred vision and amioderone will be decreased back to 200 mg a day from 400 mg a day.  He is pending a TEE guided cardioversion today .  F/u in afib clinic 4/10. He had a successful cardioversion 3/8 but was hospitalized 3/12 with the flu. He did stay in SR during  that hospitalization. He had f/u  with Dr. Angelena Form 3/30 and was back in atrial flutter with RVR. Amiodarone was increased back to 200 mg bid. Pt cannot tolerate rate control meds due to hypotension.  He states that his dry weight was increased at dialysis for hypotension and he ended back in the ER 4/1 with fluid overload. He had dialysis in the hospital and successful TEE guided  cardioversion. EF was 40-45 % . He presented to dialysis yesterday and more fluid than usual was pulled off and pt felt bad afterward. That is when he noted that his heart rate was rapid again. Today , EKG confirms atrial flutter at 123 bpm with sys BP of 92/66. He continues on  coumadin.  Today, he denies symptoms of palpitations, chest pain, shortness of breath, orthopnea, PND, lower extremity edema, dizziness, presyncope, syncope, or neurologic sequela. Positive for fatigue, weakness. The patient is tolerating medications without difficulties and is otherwise without complaint today.   Past Medical History:  Diagnosis Date  . Acute blood loss anemia    a. 02/2016 due to groin hematoma. Rec 3 U PRBC.  [name] Anemia   . Aortic heart valve prolapse 04/2013   a. s/p bioprosthetic AVR at time of CABG.  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 Bifascicular block   . CAD (coronary artery disease) 04/2013   a. 04/2013: s/p CABG x 2 (Y SVG -LAD & D2). b. 01/2015: NSTEMI s/p DES to LCx, BMS to SVG-LAD. c. NSTEMI 05/2015 during AF/AFL - non-flow limiting FFR. d. Low risk nuc 3/17. e. STEMI 6/17 after coming off Plavix, s/p DES to SVG-LAD into native vessel.  . Carotid artery disease ([place])    a. 1-39% stenosis bilaterally in 06/2015.   [name] Chronic respiratory failure ([place])   . COPD (chronic obstructive pulmonary disease) ([place])   . Dyspnea   . 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 b. He had a renal Tx from 2008 to 2011, but subsequent rejection - Gets HD in [place], [place] on MWF schedule.    . Hematoma    a. Large right groin hematoma after cath 02/2016 with associated ABL anemia.  . Hypertension   . Multiple sclerosis ([place])   . Nocturnal hypoxemia   . On home oxygen therapy    pt states he has not been wearing it  . PAF (paroxysmal atrial fibrillation) ([place])    a. h/o, placed on amiodarone 07/2016 due to recurrence.  . Paroxysmal atrial flutter ([place])    a. During 05/2015 admission - SVT, atrial flutter, and PAF.  [name] Peripheral vascular disease ([place])    Cath in 01/2015 required 45 cm [place]  . Renal transplant failure and rejection   . S/P CABG x 2 04/2013   s/p CABG x 2 (Y SVG -LAD & D2);   [name] Sleep apnea     a. intolerant of bipap.  [name] SVT (supraventricular tachycardia) ([place])   . Valvular heart disease    a. 2D Echo 03/25/16: mild LVH, EF 50-55%, grade 2 Dd, AVR present with mild AI, mod MR, severely dilated LA, mildly dilated RV, mod RAE, PASP 72.   Past Surgical History:  Procedure Laterality Date  . AV FISTULA PLACEMENT    . CARDIAC CATHETERIZATION N/A 05/26/2015   Procedure: Left Heart Cath and Coronary Angiography;  Surgeon: Leonie Man, MD;  Location: [facility];  Service: Cardiovascular;  Laterality: N/A;  . CARDIAC CATHETERIZATION N/A 05/26/2015  Procedure: Intravascular Pressure Wire/FFR Study;  Surgeon: Leonie Man, MD;  Location: [facility];  Service: Cardiovascular;  Laterality: N/A;  . CARDIAC CATHETERIZATION N/A 02/15/2016   Procedure: Left Heart Cath and Coronary Angiography;  Surgeon: Wellington Hampshire, MD;  Location: [facility];  Service: Cardiovascular;  Laterality: N/A;  . CARDIOVERSION N/A 07/12/2016   Procedure: CARDIOVERSION;  Surgeon: Minus Breeding, MD;  Location: [facility] ENDOSCOPY;  Service: Cardiovascular;  Laterality: N/A;  . CARDIOVERSION N/A 11/11/2016   Procedure: CARDIOVERSION;  Surgeon: Fay Records, MD;  Location: [facility] ENDOSCOPY;  Service: Cardiovascular;  Laterality: N/A;  . CARDIOVERSION N/A 12/09/2016   Procedure: CARDIOVERSION;  Surgeon: Sanda Klein, MD;  Location: [facility] ENDOSCOPY;  Service: Cardiovascular;  Laterality: N/A;  . CORONARY ARTERY BYPASS GRAFT    . excised squamous cells at rectum  2006  . flash     flash pulmonary edema  . HERNIA REPAIR  07/2011  . KIDNEY TRANSPLANT  08/2007  . KNEE SURGERY    . LEFT HEART CATHETERIZATION WITH CORONARY ANGIOGRAM N/A 01/09/2013   Procedure: LEFT HEART CATHETERIZATION WITH CORONARY ANGIOGRAM;  Surgeon: Burnell Blanks, MD;  Location: [facility] CATH LAB;  Service: Cardiovascular;  Laterality: N/A;  . TEE WITHOUT CARDIOVERSION N/A 11/11/2016   Procedure: TRANSESOPHAGEAL ECHOCARDIOGRAM (TEE);   Surgeon: Fay Records, MD;  Location: [facility];  Service: Cardiovascular;  Laterality: N/A;  . TEE WITHOUT CARDIOVERSION N/A 12/09/2016   Procedure: TRANSESOPHAGEAL ECHOCARDIOGRAM (TEE);  Surgeon: Sanda Klein, MD;  Location: [facility] ENDOSCOPY;  Service: Cardiovascular;  Laterality: N/A;    Current Outpatient Prescriptions  Medication Sig Dispense Refill  . acetaminophen (TYLENOL) 500 MG tablet Take 1,000 mg by mouth every 6 (six) hours as needed for pain or fever.    [name] albuterol (PROAIR HFA) 108 (90 BASE) MCG/ACT inhaler Inhale 1-2 puffs into the lungs every 6 (six) hours as needed for shortness of breath.     [name] amiodarone (PACERONE) 200 MG tablet Take 1 tablet (200 mg total) by mouth 2 (two) times daily. 180 tablet 1  . atorvastatin (LIPITOR) 40 MG tablet Take 20 mg by mouth at bedtime.     . calcium acetate (PHOSLO) 667 MG capsule Take 4,002 mg by mouth 3 (three) times daily with meals.     . citalopram (CELEXA) 20 MG tablet Take 20 mg by mouth at bedtime.     . clopidogrel (PLAVIX) 75 MG tablet Take 1 tablet (75 mg total) by mouth daily. 90 tablet 3  . docusate sodium (COLACE) 100 MG capsule Take 400 mg by mouth 2 (two) times daily.     . folic acid (FOLVITE) 1 MG tablet Take 1 mg by mouth daily.     [name] gabapentin (NEURONTIN) 300 MG capsule Take 300 mg by mouth at bedtime.     [name] guaiFENesin (MUCINEX) 600 MG 12 hr tablet Take 1 tablet (600 mg total) by mouth 2 (two) times daily. 14 tablet 0  . ipratropium-albuterol (DUONEB) 0.5-2.5 (3) MG/3ML SOLN Take 3 mLs by nebulization every 4 (four) hours as needed. (Patient taking differently: Take 3 mLs by nebulization 3 (three) times daily. scheduled) 120 mL 0  . midodrine (PROAMATINE) 10 MG tablet Take 1 tablet (10 mg total) by mouth every Monday, Wednesday, and Friday with hemodialysis. (Patient taking differently: Take 10 mg by mouth See admin instructions. Take 1 tablet (10 mg) by mouth on Monday, Wednesday, Friday 1 1/2 hours before dialysis)    .  multivitamin (RENA-VIT) TABS tablet  Take 1 tablet by mouth daily. 30 tablet 1  . nitroGLYCERIN (NITROSTAT) 0.4 MG SL tablet Place 0.4 mg under the tongue every 5 (five) minutes as needed for chest pain (max 3 doses - if no relief call MD).     . OXYGEN Inhale 4 L into the lungs continuous.    . pantoprazole (PROTONIX) 40 MG tablet Take 1 tablet (40 mg total) by mouth daily. 30 tablet 0  . polyethylene glycol (MIRALAX / GLYCOLAX) packet Take 17 g by mouth at bedtime. Mix in 8 oz liquid and drink    . predniSONE (DELTASONE) 20 MG tablet Take '60mg'$  x 2 days, '40mg'$  x 2 days, '20mg'$  x 3 days 13 tablet 0  . ranitidine (ZANTAC) 150 MG tablet Take 150 mg by mouth daily.     . sodium polystyrene (KAYEXALATE) 15 GM/60ML suspension Take 15 g by mouth See admin instructions. Take 60 ml (15 g) by mouth in the morning on non-dialysis days - Sunday, Tuesday, Thursday, Saturday    . warfarin (COUMADIN) 7.5 MG tablet Take 1/2-1 tablet by mouth daily as directed by coumadin clinic (Patient taking differently: take 1/2 tablet by mouth daily at bedtime or as directed by coumadin clinic) 30 tablet 1   No current facility-administered medications for this encounter.     Allergies  Allergen Reactions  . Diphenhydramine Itching    Only with IV doses. Tolerates oral.  . Penicillins Other (See Comments)    Migraine Has patient had a PCN reaction causing immediate rash, facial/tongue/throat swelling, SOB or lightheadedness with hypotension: no Has patient had a PCN reaction causing severe rash involving mucus membranes or skin necrosis: No Has patient had a PCN reaction that required hospitalization No Has patient had a PCN reaction occurring within the last 10 years: No If all of the above answers are "NO", then may proceed with Cephalosporin use.  . Bupropion Other (See Comments)    Caused anxiety  . Adhesive [Tape] Rash    Please use paper tape  . Amoxicillin Rash    migraine  . Ativan [Lorazepam] Anxiety  .  Other Rash    Please use paper tape    Social History   Social History  . Marital status: Married    Spouse name: N/A  . Number of children: N/A  . Years of education: N/A   Occupational History  . Disabled    Social History Main Topics  . Smoking status: Former Smoker    Packs/day: 2.00    Years: 20.00    Types: Cigarettes    Quit date: 08/06/1998  . Smokeless tobacco: Never Used  . Alcohol use No  . Drug use: No  . Sexual activity: Not on file   Other Topics Concern  . Not on file   Social History Narrative   No history of premature CAD in either parents or siblings. However, his maternal grandfather and 2 maternal uncles had coronary artery disease.    Family History  Problem Relation Age of Onset  . Heart failure Mother     started in 25s  . Emphysema Mother     smoked  . Diabetes Father   . Lupus Sister   . Heart attack Maternal Grandfather   . Heart attack Maternal Uncle   . Heart attack Maternal Aunt   . Hypertension Maternal Aunt   . Stroke Neg Hx     ROS- All systems are reviewed and negative except as per the HPI above  Physical Exam: Vitals:  12/14/16 1412  BP: 92/66  Pulse: (!) 123  Weight: 172 lb 9.6 oz (78.3 kg)  Height: '5\' 10"'$  (1.778 m)   Wt Readings from Last 3 Encounters:  12/14/16 172 lb 9.6 oz (78.3 kg)  12/09/16 174 lb 2.6 oz (79 kg)  12/03/16 178 lb 6.4 oz (80.9 kg)    Labs: Lab Results  Component Value Date   NA 138 12/08/2016   K 5.0 12/08/2016   CL 95 (L) 12/08/2016   CO2 24 12/08/2016   GLUCOSE 93 12/08/2016   BUN 71 (H) 12/08/2016   CREATININE 8.81 (H) 12/08/2016   CALCIUM 8.9 12/08/2016   PHOS 6.3 (H) 11/16/2016   MG 2.4 12/08/2016   Lab Results  Component Value Date   INR 2.0 12/14/2016   Lab Results  Component Value Date   CHOL 98 (L) 03/05/2016   HDL 32 (L) 03/05/2016   LDLCALC 37 03/05/2016   TRIG 143 03/05/2016     GEN- The patient is chronically ill  appearing, alert and oriented x 3 today.     Head- normocephalic, atraumatic Eyes-  Sclera clear, conjunctiva pink Ears- hearing intact Oropharynx- clear Neck- supple, no JVP Lymph- no cervical lymphadenopathy Lungs- Clear to ausculation bilaterally, normal work of breathing Heart- Rapid,regular rate and rhythm, no murmurs, rubs or gallops, PMI not laterally displaced GI- soft, NT, ND, + BS Extremities- no clubbing, cyanosis, or edema MS- no significant deformity or atrophy Skin- no rash or lesion Psych- euthymic mood, full affect Neuro- strength and sensation are intact  EKG-atrial flutter at 123 bpm  Epic records reviewed TTE- 6/17-Study Conclusions  - Left ventricle: The cavity size was normal. Systolic function was   normal. The estimated ejection fraction was in the range of 55%   to 60%. There is hypokinesis of the basal-midanteroseptal   myocardium. There is hypokinesis of the apicalanterior   myocardium. - Aortic valve: Severely calcified annulus. Trileaflet. Mild   diffuse thickening and calcification. There was very mild   stenosis. There was mild regurgitation. Valve area (VTI): 1.51   cm^2. Valve area (Vmax): 1.45 cm^2. Valve area (Vmean): 1.33   cm^2. - Mitral valve: Severely calcified annulus. Severe diffuse   thickening and calcification of the posterior leaflet, with   moderate involvement of chords. Mild diffuse calcification of the   anterior leaflet, with moderate involvement of chords. Mobility   of the posterior leaflet was restricted to the point of   immobility. There was mild regurgitation. - Left atrium: The atrium was moderately dilated. - Pulmonary arteries: PA peak pressure: 51 mm Hg (S).  Impressions:  - The right ventricular systolic pressure was increased consistent   with moderate pulmonary hypertension.    Assessment and Plan: 1.  Recurrent symptomatic atrial flutter with RVR Continue for now amiodarone 200 mg bid Has had many successful cardioversions but has ERAF I am out  of options to offer pt and will refer to Dr. Rayann Heman for evaluation Unable to use rate control drugs due to hypotension Other antiarrythmic's not an option due to CAD/ESRD Continue warfarin Continue HD  Pt with complicated history and high risk for decompensation   Appointment has been made with Dr. Rayann Heman for 4/11 at 2:30 pm.   Butch Penny C. Jubilee Vivero, [facility] [address] [place], [place] [address] [phone]

## 2016-12-15 ENCOUNTER — Encounter: Payer: Self-pay | Admitting: Internal Medicine

## 2016-12-15 ENCOUNTER — Ambulatory Visit (INDEPENDENT_AMBULATORY_CARE_PROVIDER_SITE_OTHER): Payer: Medicare Other | Admitting: Internal Medicine

## 2016-12-15 VITALS — BP 94/68 | HR 121 | Ht 70.0 in | Wt 173.6 lb

## 2016-12-15 DIAGNOSIS — I4891 Unspecified atrial fibrillation: Secondary | ICD-10-CM | POA: Diagnosis not present

## 2016-12-15 DIAGNOSIS — Z7901 Long term (current) use of anticoagulants: Secondary | ICD-10-CM | POA: Diagnosis not present

## 2016-12-15 DIAGNOSIS — I4892 Unspecified atrial flutter: Secondary | ICD-10-CM

## 2016-12-15 DIAGNOSIS — I483 Typical atrial flutter: Secondary | ICD-10-CM

## 2016-12-15 DIAGNOSIS — N2581 Secondary hyperparathyroidism of renal origin: Secondary | ICD-10-CM | POA: Diagnosis not present

## 2016-12-15 DIAGNOSIS — E875 Hyperkalemia: Secondary | ICD-10-CM | POA: Diagnosis not present

## 2016-12-15 DIAGNOSIS — N186 End stage renal disease: Secondary | ICD-10-CM

## 2016-12-15 DIAGNOSIS — D509 Iron deficiency anemia, unspecified: Secondary | ICD-10-CM | POA: Diagnosis not present

## 2016-12-15 NOTE — Patient Instructions (Addendum)
Medication Instructions:  Your physician has recommended you make the following change in your medication:  1) Stop Amiodarone   Labwork: Your physician recommends that you return for lab work today: BMP/CBC  INR 12/20/16 at the hospital while getting TEE    Testing/Procedures: Your physician has requested that you have a TEE. During a TEE, sound waves are used to create images of your heart. It provides your doctor with information about the size and shape of your heart and how well your heart's chambers and valves are working. In this test, a transducer is attached to the end of a flexible tube that's guided down your throat and into your esophagus (the tube leading from you mouth to your stomach) to get a more detailed image of your heart. You are not awake for the procedure. Please see the instruction sheet given to you today. For further information please visit [email or web address]  Please arrive at [facility] at 12:30 Do not eat or drink after midnight the night prior to the procedure Okay to take your medications the morning of the procedure if needed with a small sip of water Will need some to drive you home after the procedure     Your physician has recommended that you have an ablation. Catheter ablation is a medical procedure used to treat some cardiac arrhythmias (irregular heartbeats). During catheter ablation, a long, thin, flexible tube is put into a blood vessel in your groin (upper thigh), or neck. This tube is called an ablation catheter. It is then guided to your heart through the blood vessel. Radio frequency waves destroy small areas of heart tissue where abnormal heartbeats may cause an arrhythmia to start. Please see the instruction sheet given to you today.---12/21/16  Please arrive at [facility] at 5:30am Do not eat or drink after midnight the night prior to the  procedure Do not take your medications the morning of the procedure Plan for one night stay  Will need some to drive you home after the procedure    Follow-Up: Your physician recommends that you schedule a follow-up appointment in: 5 weeks with [name], NP and 3 months with Dr Rayann Heman   Any Other Special Instructions Will Be Listed Below (If Applicable).     If you need a refill on your cardiac medications before your next appointment, please call your pharmacy.

## 2016-12-16 LAB — CBC WITH DIFFERENTIAL/PLATELET
BASOS ABS: 0 10*3/uL (ref 0.0–0.2)
Basos: 0 %
EOS (ABSOLUTE): 0.1 10*3/uL (ref 0.0–0.4)
Eos: 1 %
Hematocrit: 39.1 % (ref 37.5–51.0)
Hemoglobin: 12.8 g/dL — ABNORMAL LOW (ref 13.0–17.7)
Immature Grans (Abs): 0 10*3/uL (ref 0.0–0.1)
Immature Granulocytes: 0 %
LYMPHS ABS: 0.5 10*3/uL — AB (ref 0.7–3.1)
Lymphs: 5 %
MCH: 32.9 pg (ref 26.6–33.0)
MCHC: 32.7 g/dL (ref 31.5–35.7)
MCV: 101 fL — ABNORMAL HIGH (ref 79–97)
MONOCYTES: 6 %
MONOS ABS: 0.5 10*3/uL (ref 0.1–0.9)
Neutrophils Absolute: 8.3 10*3/uL — ABNORMAL HIGH (ref 1.4–7.0)
Neutrophils: 88 %
PLATELETS: 139 10*3/uL — AB (ref 150–379)
RBC: 3.89 x10E6/uL — AB (ref 4.14–5.80)
RDW: 15.8 % — ABNORMAL HIGH (ref 12.3–15.4)
WBC: 9.4 10*3/uL (ref 3.4–10.8)

## 2016-12-16 LAB — BASIC METABOLIC PANEL
BUN / CREAT RATIO: 7 — AB (ref 10–24)
BUN: 31 mg/dL — AB (ref 8–27)
CHLORIDE: 94 mmol/L — AB (ref 96–106)
CO2: 34 mmol/L — ABNORMAL HIGH (ref 18–29)
Calcium: 8 mg/dL — ABNORMAL LOW (ref 8.6–10.2)
Creatinine, Ser: 4.25 mg/dL — ABNORMAL HIGH (ref 0.76–1.27)
GFR calc Af Amer: 16 mL/min/{1.73_m2} — ABNORMAL LOW (ref >59)
GFR calc non Af Amer: 14 mL/min/{1.73_m2} — ABNORMAL LOW (ref >59)
GLUCOSE: 97 mg/dL (ref 65–99)
Potassium: 4.8 mmol/L (ref 3.5–5.2)
SODIUM: 141 mmol/L (ref 134–144)

## 2016-12-17 DIAGNOSIS — N2581 Secondary hyperparathyroidism of renal origin: Secondary | ICD-10-CM | POA: Diagnosis not present

## 2016-12-17 DIAGNOSIS — E875 Hyperkalemia: Secondary | ICD-10-CM | POA: Diagnosis not present

## 2016-12-17 DIAGNOSIS — N186 End stage renal disease: Secondary | ICD-10-CM | POA: Diagnosis not present

## 2016-12-17 DIAGNOSIS — D509 Iron deficiency anemia, unspecified: Secondary | ICD-10-CM | POA: Diagnosis not present

## 2016-12-17 NOTE — Progress Notes (Signed)
Electrophysiology Office Note   Date:  12/17/2016   ID:  [name], DOB [birth year], MRN [id number]  PCP:  Cameron Lower, MD  Cardiologist:  Dr [name] Primary Electrophysiologist: Cameron Grayer, MD    CC: atrial flutter   History of Present Illness: [name] is a 61 y.o. male who presents today for electrophysiology evaluation.   The patient is referred today by Cameron Palau NP for consultation regarding complex atrial arrhythmias.  He has a very complicated medical history which includes CAD s/p CABG with multiple stents, prior AVR (bioprosthetic), ESRD, multiple sclerosis, OSA, home O2 due to chronic lung disease, and atrial arrhythmias.   He was hospitalized 07/2016 for atrial flutter.  He has been treated with high dose amiodarone with progressive SOB.  He has had atrial flutter documented  In January and also in March despite ongoing high doses of amiodarone.  On review, I see very frequent typical appearing atrial flutter.  There is only 1 ekg which shows afib.  This was 02/26/16.  He carries diagnosis of afib and atrial flutter.  Both rate and rhythm control have been difficult.  He has hypotension with dialysis.  EKG today reveals typical appearing atrial flutter with V rates 130s.  He has worsening SOB and fatigue.  He feels "aweful" with his atrial arrhythmias.  Today, he denies symptoms of palpitations, chest pain, orthopnea, PND, Weiss extremity edema, claudication,  presyncope, syncope, bleeding, or neurologic sequela. The patient is tolerating medications without difficulties and is otherwise without complaint today.    Past Medical History:  Diagnosis Date  . Acute blood loss anemia    a. 02/2016 due to groin hematoma. Rec 3 U PRBC.  Marland Kitchen Anemia   . Aortic heart valve prolapse 04/2013   a. s/p bioprosthetic AVR at time of CABG.  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 Bifascicular block   . CAD (coronary artery disease) 04/2013   a. 04/2013: s/p CABG  x 2 (Y SVG -LAD & D2). b. 01/2015: NSTEMI s/p DES to LCx, BMS to SVG-LAD. c. NSTEMI 05/2015 during AF/AFL - non-flow limiting FFR. d. Low risk nuc 3/17. e. STEMI 6/17 after coming off Plavix, s/p DES to SVG-LAD into native vessel.  . Carotid artery disease ([place])    a. 1-39% stenosis bilaterally in 06/2015.   Marland Kitchen Chronic respiratory failure ([place])   . COPD (chronic obstructive pulmonary disease) ([place])   . Dyspnea   . 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 b. He had a renal Tx from 2008 to 2011, but subsequent rejection - Gets HD in [place], [place] on MWF schedule.    . Hematoma    a. Large right groin hematoma after cath 02/2016 with associated ABL anemia.  . Hypertension   . Multiple sclerosis ([place])   . Nocturnal hypoxemia   . On home oxygen therapy    pt states he has not been wearing it  . PAF (paroxysmal atrial fibrillation) ([place])    a. h/o, placed on amiodarone 07/2016 due to recurrence.  . Paroxysmal atrial flutter ([place])    a. During 05/2015 admission - SVT, atrial flutter, and PAF.  Marland Kitchen Peripheral vascular disease ([place])    Cath in 01/2015 required 45 cm Destination Sheath  . Renal transplant failure and rejection   . 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 intolerant  of bipap.  Marland Kitchen SVT (supraventricular tachycardia) ([place])   . Valvular heart disease    a. 2D Echo 03/25/16: mild LVH, EF 50-55%, grade 2 Dd, AVR present with mild AI, mod MR, severely dilated LA, mildly dilated RV, mod RAE, PASP 72.   Past Surgical History:  Procedure Laterality Date  . AV FISTULA PLACEMENT    . CARDIAC CATHETERIZATION N/A 05/26/2015   Procedure: Left Heart Cath and Coronary Angiography;  Surgeon: Leonie Man, MD;  Location: [facility];  Service: Cardiovascular;  Laterality: N/A;  . CARDIAC CATHETERIZATION N/A 05/26/2015   Procedure: Intravascular Pressure Wire/FFR Study;  Surgeon: Leonie Man, MD;  Location: [facility];  Service: Cardiovascular;  Laterality: N/A;  . CARDIAC CATHETERIZATION N/A 02/15/2016   Procedure: Left Heart Cath and Coronary Angiography;  Surgeon: Wellington Hampshire, MD;  Location: [facility];  Service: Cardiovascular;  Laterality: N/A;  . CARDIOVERSION N/A 07/12/2016   Procedure: CARDIOVERSION;  Surgeon: Minus Breeding, MD;  Location: [facility] ENDOSCOPY;  Service: Cardiovascular;  Laterality: N/A;  . CARDIOVERSION N/A 11/11/2016   Procedure: CARDIOVERSION;  Surgeon: Fay Records, MD;  Location: [facility] ENDOSCOPY;  Service: Cardiovascular;  Laterality: N/A;  . CARDIOVERSION N/A 12/09/2016   Procedure: CARDIOVERSION;  Surgeon: Sanda Klein, MD;  Location: [facility] ENDOSCOPY;  Service: Cardiovascular;  Laterality: N/A;  . CORONARY ARTERY BYPASS GRAFT    . excised squamous cells at rectum  2006  . flash     flash pulmonary edema  . HERNIA REPAIR  07/2011  . KIDNEY TRANSPLANT  08/2007  . KNEE SURGERY    . LEFT HEART CATHETERIZATION WITH CORONARY ANGIOGRAM N/A 01/09/2013   Procedure: LEFT HEART CATHETERIZATION WITH CORONARY ANGIOGRAM;  Surgeon: Burnell Blanks, MD;  Location[facility] CATH LAB;  Service: Cardiovascular;  Laterality: N/A;  . TEE WITHOUT CARDIOVERSION N/A 11/11/2016   Procedure: TRANSESOPHAGEAL ECHOCARDIOGRAM (TEE);  Surgeon: Fay Records, MD;  Location: [place];  Service: Cardiovascular;  Laterality: N/A;  . TEE WITHOUT CARDIOVERSION N/A 12/09/2016   Procedure: TRANSESOPHAGEAL ECHOCARDIOGRAM (TEE);  Surgeon: Sanda Klein, MD;  Location: [facility] ENDOSCOPY;  Service: Cardiovascular;  Laterality: N/A;     Current Outpatient Prescriptions  Medication Sig Dispense Refill  . acetaminophen (TYLENOL) 500 MG tablet Take 1,000 mg by mouth every 6 (six) hours as needed for pain or fever.    Marland Kitchen albuterol (PROAIR HFA) 108 (90 Base) MCG/ACT inhaler Inhale 1-2 puffs into the lungs every 6 (six) hours as needed for shortness of breath.     Marland Kitchen atorvastatin (LIPITOR) 40 MG tablet Take 20 mg  by mouth at bedtime.     . calcium acetate (PHOSLO) 667 MG capsule Take 4,002 mg by mouth 3 (three) times daily with meals.     . citalopram (CELEXA) 20 MG tablet Take 20 mg by mouth at bedtime.     . clopidogrel (PLAVIX) 75 MG tablet Take 1 tablet (75 mg total) by mouth daily. 90 tablet 3  . docusate sodium (COLACE) 100 MG capsule Take 200 mg by mouth 2 (two) times daily.     . folic acid (FOLVITE) 1 MG tablet Take 1 mg by mouth daily.     Marland Kitchen gabapentin (NEURONTIN) 300 MG capsule Take 300 mg by mouth at bedtime.     Marland Kitchen guaiFENesin (MUCINEX) 600 MG 12 hr tablet Take 1 tablet (600 mg total) by mouth 2 (two) times daily. (Patient taking differently: Take 600 mg by mouth at bedtime. ) 14 tablet 0  .  ipratropium-albuterol (DUONEB) 0.5-2.5 (3) MG/3ML SOLN Take 3 mLs by nebulization 2 (two) times daily.     . midodrine (PROAMATINE) 10 MG tablet Take 1 tablet (10 mg total) by mouth every Monday, Wednesday, and Friday with hemodialysis. (Patient taking differently: Take 10 mg by mouth See admin instructions. Take 1 tablet (10 mg) by mouth on Monday, Wednesday, Friday 1 1/2 hours before dialysis)    . multivitamin (RENA-VIT) TABS tablet Take 1 tablet by mouth daily. 30 tablet 1  . nitroGLYCERIN (NITROSTAT) 0.4 MG SL tablet Place 0.4 mg under the tongue every 5 (five) minutes as needed for chest pain (max 3 doses - if no relief call MD).     . OXYGEN Inhale 4 L into the lungs continuous.    . pantoprazole (PROTONIX) 40 MG tablet Take 1 tablet (40 mg total) by mouth daily. 30 tablet 0  . polyethylene glycol (MIRALAX / GLYCOLAX) packet Take 17 g by mouth at bedtime. Mix in 8 oz liquid and drink    . ranitidine (ZANTAC) 150 MG tablet Take 150 mg by mouth daily.     . sodium polystyrene (KAYEXALATE) 15 GM/60ML suspension Take 15 g by mouth See admin instructions. Take 60 ml (15 g) by mouth in the morning on non-dialysis days - Sunday, Tuesday, Thursday, Saturday    . warfarin (COUMADIN) 7.5 MG tablet Take 1/2-1  tablet by mouth daily as directed by coumadin clinic (Patient taking differently: take 1/2 tablet by mouth daily at bedtime or as directed by coumadin clinic) 30 tablet 1  . oxymetazoline (AFRIN) 0.05 % nasal spray Place 1 spray into both nostrils 2 (two) times daily as needed for congestion.     No current facility-administered medications for this visit.     Allergies:   Diphenhydramine; Penicillins; Bupropion; Adhesive [tape]; Amoxicillin; and Ativan [lorazepam]   Social History:  The patient  reports that he quit smoking about 18 years ago. His smoking use included Cigarettes. He has a 40.00 pack-year smoking history. He has never used smokeless tobacco. He reports that he does not drink alcohol or use drugs.   Family History:  The patient's  family history includes Diabetes in his father; Emphysema in his mother; Heart attack in his maternal aunt, maternal grandfather, and maternal uncle; Heart failure in his mother; Hypertension in his maternal aunt; Lupus in his sister.    ROS:  Please see the history of present illness.   All other systems are personally reviewed and negative.    PHYSICAL EXAM: VS:  BP 94/68   Pulse (!) 121   Ht '5\' 10"'$  (1.778 m)   Wt 173 lb 9.6 oz (78.7 kg)   SpO2 95%   BMI 24.91 kg/m  , BMI Body mass index is 24.91 kg/m. GEN: chronically ill in no acute distress  HEENT: normal  Neck: no JVD, carotid bruits, or masses Cardiac: tachycardic irregular rhythm;  no edema  Respiratory:  Very poor air movement throughout, wearing O2, normal work of breathing GI: soft, nontender, nondistended, + BS MS: diffusec muscle atrophy  Skin: warm and dry  Neuro:  Strength and sensation are intact Psych: euthymic mood, full affect  EKG:  EKG is ordered today. The ekg ordered today is personally reviewed and shows typical appearing atrial flutter, V rates 130s, RBBB, Inferior infarct, anterior infarct   Recent Labs: 11/16/2016: TSH 3.957 12/05/2016: B Natriuretic Peptide  1,624.4 12/06/2016: ALT 38 12/08/2016: Magnesium 2.4 12/09/2016: Hemoglobin 12.2 12/15/2016: BUN 31; Creatinine, Ser 4.25; Platelets 139; Potassium  4.8; Sodium 141  personally reviewed   Lipid Panel     Component Value Date/Time   CHOL 98 (L) 03/05/2016 1125   TRIG 143 03/05/2016 1125   HDL 32 (L) 03/05/2016 1125   CHOLHDL 3.1 03/05/2016 1125   VLDL 29 03/05/2016 1125   LDLCALC 37 03/05/2016 1125   personally reviewed   Wt Readings from Last 3 Encounters:  12/15/16 173 lb 9.6 oz (78.7 kg)  12/14/16 172 lb 9.6 oz (78.3 kg)  12/09/16 174 lb 2.6 oz (79 kg)      Other studies personally reviewed: Additional studies/ records that were reviewed today include: AF clinic notes, prior echo, ekgs  Review of the above records today demonstrates: as above  Echo reveals EF 45%, prosthetic aortiv valve, severe MR, mild to moderate LA enlargement, moderate to severe tricuspid valve regurg   ASSESSMENT AND PLAN:  1.  Typical appearing atrial flutter/ atrial fibrillation The patient has medicine refractory arrhythmias. I primarily see atrial flutter with very difficult to control V rates.  He has only one ekg with afib.  I worry about high dose amiodarone for him.  He has significant lung disease.  I do not think that he will do well with uncontrolled V rates long term.  Additional rate control is limited by hypotension.  He is not a good candidate for AV nodal ablation given ESRD.  I would advise atrial flutter ablation.  In addition, I think that he may receive benefit from pulmonary vein isolation, though I worry about long term durability given his significant valvular heart disease. Therapeutic strategies for afib and typical appearing atrial flutter including medicine and ablation were discussed in detail with the patient today. Risk, benefits, and alternatives to EP study and radiofrequency ablation were also discussed in detail today. These risks include but are not limited to stroke, bleeding,  vascular damage, tamponade, perforation, damage to the esophagus, lungs, and other structures, pulmonary vein stenosis, worsening renal function, and death. The patient understands these risk and wishes to proceed.  He understands that risks are increased given his multiple comoribidities including significant lung disease.  As his atrial flutter may be left atrial, will proceed with another TEE prior to ablation to exclude LA thrombus.  This may also be helpful in further evaluation of his MR.  Weekly INRs prior to ablation.  Given his overall poor tolerate of RVR, we will try to move his ablation up to the soonest time.  Very complicated gentleman A high level of decision making was required for this visit.  Current medicines are reviewed at length with the patient today.   The patient does not have concerns regarding his medicines.  The following changes were made today:  none  Labs/ tests ordered today include:  Orders Placed This Encounter  Procedures  . Basic metabolic panel  . CBC with Differential  . EKG 12-Lead     Signed, Cameron Grayer, MD  12/17/2016 11:36 PM     [place] [address] [phone] (office) [phone] (fax)

## 2016-12-20 ENCOUNTER — Inpatient Hospital Stay (HOSPITAL_COMMUNITY)
Admission: RE | Admit: 2016-12-20 | Discharge: 2016-12-21 | DRG: 314 | Disposition: A | Discharge status: Home / Self Care | Payer: Medicare Other | Source: Ambulatory Visit | Attending: Cardiology | Admitting: Cardiology

## 2016-12-20 ENCOUNTER — Encounter (HOSPITAL_COMMUNITY)
Admission: RE | Disposition: A | Discharge status: Home / Self Care | Payer: Self-pay | Source: Ambulatory Visit | Attending: Cardiology

## 2016-12-20 ENCOUNTER — Encounter (HOSPITAL_COMMUNITY): Payer: Self-pay | Admitting: *Deleted

## 2016-12-20 ENCOUNTER — Encounter (HOSPITAL_COMMUNITY): Payer: Self-pay | Admitting: Certified Registered"

## 2016-12-20 ENCOUNTER — Ambulatory Visit (HOSPITAL_COMMUNITY): Payer: Medicare Other

## 2016-12-20 DIAGNOSIS — I4510 Unspecified right bundle-branch block: Secondary | ICD-10-CM | POA: Diagnosis present

## 2016-12-20 DIAGNOSIS — Z9981 Dependence on supplemental oxygen: Secondary | ICD-10-CM

## 2016-12-20 DIAGNOSIS — I25810 Atherosclerosis of coronary artery bypass graft(s) without angina pectoris: Secondary | ICD-10-CM | POA: Diagnosis present

## 2016-12-20 DIAGNOSIS — E875 Hyperkalemia: Secondary | ICD-10-CM | POA: Diagnosis not present

## 2016-12-20 DIAGNOSIS — I4892 Unspecified atrial flutter: Secondary | ICD-10-CM | POA: Diagnosis present

## 2016-12-20 DIAGNOSIS — Z79899 Other long term (current) drug therapy: Secondary | ICD-10-CM | POA: Diagnosis not present

## 2016-12-20 DIAGNOSIS — I252 Old myocardial infarction: Secondary | ICD-10-CM

## 2016-12-20 DIAGNOSIS — Z7902 Long term (current) use of antithrombotics/antiplatelets: Secondary | ICD-10-CM | POA: Diagnosis not present

## 2016-12-20 DIAGNOSIS — J9610 Chronic respiratory failure, unspecified whether with hypoxia or hypercapnia: Secondary | ICD-10-CM | POA: Diagnosis present

## 2016-12-20 DIAGNOSIS — Z85118 Personal history of other malignant neoplasm of bronchus and lung: Secondary | ICD-10-CM

## 2016-12-20 DIAGNOSIS — Z7901 Long term (current) use of anticoagulants: Secondary | ICD-10-CM

## 2016-12-20 DIAGNOSIS — Z880 Allergy status to penicillin: Secondary | ICD-10-CM

## 2016-12-20 DIAGNOSIS — I120 Hypertensive chronic kidney disease with stage 5 chronic kidney disease or end stage renal disease: Secondary | ICD-10-CM | POA: Diagnosis present

## 2016-12-20 DIAGNOSIS — I959 Hypotension, unspecified: Principal | ICD-10-CM | POA: Diagnosis present

## 2016-12-20 DIAGNOSIS — Z8249 Family history of ischemic heart disease and other diseases of the circulatory system: Secondary | ICD-10-CM

## 2016-12-20 DIAGNOSIS — I480 Paroxysmal atrial fibrillation: Secondary | ICD-10-CM | POA: Diagnosis present

## 2016-12-20 DIAGNOSIS — Z87891 Personal history of nicotine dependence: Secondary | ICD-10-CM | POA: Diagnosis not present

## 2016-12-20 DIAGNOSIS — Z888 Allergy status to other drugs, medicaments and biological substances status: Secondary | ICD-10-CM

## 2016-12-20 DIAGNOSIS — G35 Multiple sclerosis: Secondary | ICD-10-CM | POA: Diagnosis present

## 2016-12-20 DIAGNOSIS — N2581 Secondary hyperparathyroidism of renal origin: Secondary | ICD-10-CM | POA: Diagnosis not present

## 2016-12-20 DIAGNOSIS — Z953 Presence of xenogenic heart valve: Secondary | ICD-10-CM | POA: Diagnosis not present

## 2016-12-20 DIAGNOSIS — Z992 Dependence on renal dialysis: Secondary | ICD-10-CM | POA: Diagnosis not present

## 2016-12-20 DIAGNOSIS — G4733 Obstructive sleep apnea (adult) (pediatric): Secondary | ICD-10-CM | POA: Diagnosis present

## 2016-12-20 DIAGNOSIS — I483 Typical atrial flutter: Secondary | ICD-10-CM | POA: Diagnosis not present

## 2016-12-20 DIAGNOSIS — Y830 Surgical operation with transplant of whole organ as the cause of abnormal reaction of the patient, or of later complication, without mention of misadventure at the time of the procedure: Secondary | ICD-10-CM | POA: Diagnosis not present

## 2016-12-20 DIAGNOSIS — T8611 Kidney transplant rejection: Secondary | ICD-10-CM | POA: Diagnosis present

## 2016-12-20 DIAGNOSIS — J449 Chronic obstructive pulmonary disease, unspecified: Secondary | ICD-10-CM | POA: Diagnosis present

## 2016-12-20 DIAGNOSIS — Z91048 Other nonmedicinal substance allergy status: Secondary | ICD-10-CM | POA: Diagnosis not present

## 2016-12-20 DIAGNOSIS — N186 End stage renal disease: Secondary | ICD-10-CM | POA: Diagnosis present

## 2016-12-20 DIAGNOSIS — D509 Iron deficiency anemia, unspecified: Secondary | ICD-10-CM | POA: Diagnosis not present

## 2016-12-20 DIAGNOSIS — I481 Persistent atrial fibrillation: Secondary | ICD-10-CM | POA: Diagnosis not present

## 2016-12-20 DIAGNOSIS — I950 Idiopathic hypotension: Secondary | ICD-10-CM | POA: Diagnosis not present

## 2016-12-20 LAB — CBC
HCT: 39.1 % (ref 39.0–52.0)
Hemoglobin: 11.9 g/dL — ABNORMAL LOW (ref 13.0–17.0)
MCH: 32.6 pg (ref 26.0–34.0)
MCHC: 30.4 g/dL (ref 30.0–36.0)
MCV: 107.1 fL — ABNORMAL HIGH (ref 78.0–100.0)
PLATELETS: 124 10*3/uL — AB (ref 150–400)
RBC: 3.65 MIL/uL — ABNORMAL LOW (ref 4.22–5.81)
RDW: 16.2 % — ABNORMAL HIGH (ref 11.5–15.5)
WBC: 9.1 10*3/uL (ref 4.0–10.5)

## 2016-12-20 LAB — PROTIME-INR
INR: 2.59
Prothrombin Time: 28.3 seconds — ABNORMAL HIGH (ref 11.4–15.2)

## 2016-12-20 SURGERY — CANCELLED PROCEDURE

## 2016-12-20 MED ORDER — CALCIUM ACETATE (PHOS BINDER) 667 MG PO CAPS
4002.0000 mg | ORAL_CAPSULE | Freq: Three times a day (TID) | ORAL | Status: DC
  Administered 2016-12-20 – 2016-12-21 (×2): 4002 mg via ORAL
  Filled 2016-12-20 (×2): qty 6

## 2016-12-20 MED ORDER — POLYETHYLENE GLYCOL 3350 17 G PO PACK: 17.0000 g | PACK | Freq: Every day | ORAL | Status: DC

## 2016-12-20 MED ORDER — ACETAMINOPHEN 500 MG PO TABS: 1000.0000 mg | ORAL_TABLET | Freq: Four times a day (QID) | ORAL | Status: DC | PRN

## 2016-12-20 MED ORDER — CITALOPRAM HYDROBROMIDE 20 MG PO TABS
20.0000 mg | ORAL_TABLET | Freq: Every day | ORAL | Status: DC
  Administered 2016-12-20: 20 mg via ORAL
  Filled 2016-12-20: qty 1

## 2016-12-20 MED ORDER — ASPIRIN EC 81 MG PO TBEC: 81.0000 mg | DELAYED_RELEASE_TABLET | Freq: Every day | ORAL | Status: DC

## 2016-12-20 MED ORDER — WARFARIN SODIUM 7.5 MG PO TABS
3.7500 mg | ORAL_TABLET | Freq: Once | ORAL | Status: AC
  Administered 2016-12-20: 3.75 mg via ORAL
  Filled 2016-12-20: qty 0.5

## 2016-12-20 MED ORDER — GABAPENTIN 300 MG PO CAPS
300.0000 mg | ORAL_CAPSULE | Freq: Every day | ORAL | Status: DC
  Administered 2016-12-20: 300 mg via ORAL
  Filled 2016-12-20: qty 1

## 2016-12-20 MED ORDER — SODIUM POLYSTYRENE SULFONATE 15 GM/60ML PO SUSP
15.0000 g | ORAL | Status: DC
  Administered 2016-12-21: 15 g via ORAL
  Filled 2016-12-20: qty 60

## 2016-12-20 MED ORDER — RENA-VITE PO TABS
1.0000 | ORAL_TABLET | Freq: Every day | ORAL | Status: DC
  Administered 2016-12-20: 1 via ORAL
  Filled 2016-12-20: qty 1

## 2016-12-20 MED ORDER — SODIUM CHLORIDE 0.9 % IV SOLN: INTRAVENOUS | Status: DC

## 2016-12-20 MED ORDER — WARFARIN - PHARMACIST DOSING INPATIENT: Freq: Every day | Status: DC

## 2016-12-20 MED ORDER — NITROGLYCERIN 0.4 MG SL SUBL: 0.4000 mg | SUBLINGUAL_TABLET | SUBLINGUAL | Status: DC | PRN

## 2016-12-20 MED ORDER — FENTANYL CITRATE (PF) 100 MCG/2ML IJ SOLN: 25.0000 ug | INTRAMUSCULAR | Status: DC | PRN

## 2016-12-20 MED ORDER — DOCUSATE SODIUM 100 MG PO CAPS
200.0000 mg | ORAL_CAPSULE | Freq: Two times a day (BID) | ORAL | Status: DC
  Administered 2016-12-20 – 2016-12-21 (×2): 200 mg via ORAL
  Filled 2016-12-20 (×2): qty 2

## 2016-12-20 MED ORDER — GUAIFENESIN ER 600 MG PO TB12
600.0000 mg | ORAL_TABLET | Freq: Every day | ORAL | Status: DC
  Administered 2016-12-20: 600 mg via ORAL
  Filled 2016-12-20: qty 1

## 2016-12-20 MED ORDER — FOLIC ACID 1 MG PO TABS
1.0000 mg | ORAL_TABLET | Freq: Every day | ORAL | Status: DC
  Administered 2016-12-20 – 2016-12-21 (×2): 1 mg via ORAL
  Filled 2016-12-20 (×2): qty 1

## 2016-12-20 MED ORDER — FAMOTIDINE 20 MG PO TABS
20.0000 mg | ORAL_TABLET | Freq: Every day | ORAL | Status: DC
  Administered 2016-12-20 – 2016-12-21 (×2): 20 mg via ORAL
  Filled 2016-12-20 (×2): qty 1

## 2016-12-20 MED ORDER — MIDODRINE HCL 5 MG PO TABS: 10.0000 mg | ORAL_TABLET | ORAL | Status: DC

## 2016-12-20 MED ORDER — ATORVASTATIN CALCIUM 20 MG PO TABS
20.0000 mg | ORAL_TABLET | Freq: Every day | ORAL | Status: DC
  Administered 2016-12-20: 20 mg via ORAL
  Filled 2016-12-20: qty 1

## 2016-12-20 MED ORDER — ALBUTEROL SULFATE (2.5 MG/3ML) 0.083% IN NEBU: 3.0000 mL | INHALATION_SOLUTION | Freq: Four times a day (QID) | RESPIRATORY_TRACT | Status: DC | PRN

## 2016-12-20 MED ORDER — PANTOPRAZOLE SODIUM 40 MG PO TBEC
40.0000 mg | DELAYED_RELEASE_TABLET | Freq: Every day | ORAL | Status: DC
  Administered 2016-12-20 – 2016-12-21 (×2): 40 mg via ORAL
  Filled 2016-12-20 (×2): qty 1

## 2016-12-20 MED ORDER — CLOPIDOGREL BISULFATE 75 MG PO TABS
75.0000 mg | ORAL_TABLET | Freq: Every day | ORAL | Status: DC
Start: 2016-12-20 — End: 2016-12-21
  Administered 2016-12-20 – 2016-12-21 (×2): 75 mg via ORAL
  Filled 2016-12-20 (×2): qty 1

## 2016-12-20 MED ORDER — IPRATROPIUM-ALBUTEROL 0.5-2.5 (3) MG/3ML IN SOLN
3.0000 mL | Freq: Two times a day (BID) | RESPIRATORY_TRACT | Status: DC
  Administered 2016-12-20 – 2016-12-21 (×2): 3 mL via RESPIRATORY_TRACT
  Filled 2016-12-20 (×2): qty 3

## 2016-12-20 NOTE — Progress Notes (Signed)
Paged cardiology regarding pt medication and procedure tomorrow. Talked to [facility], she stated to hold Plavix but okay to give warfarin.  [name]

## 2016-12-20 NOTE — Anesthesia Preprocedure Evaluation (Deleted)
Anesthesia Evaluation    Reviewed: Allergy & Precautions, H&P , Patient's Chart, lab work & pertinent test results  Airway        Dental   Pulmonary neg pulmonary ROS, sleep apnea , COPD, former smoker,           Cardiovascular Exercise Tolerance: Good hypertension, Pt. on medications + CAD, + Past MI, + CABG, + Peripheral Vascular Disease and +CHF  negative cardio ROS  + dysrhythmias Atrial Fibrillation      Neuro/Psych negative neurological ROS  negative psych ROS   GI/Hepatic negative GI ROS, Neg liver ROS,   Endo/Other  negative endocrine ROS  Renal/GU ESRF and DialysisRenal diseasenegative Renal ROS  negative genitourinary   Musculoskeletal   Abdominal   Peds  Hematology negative hematology ROS (+) anemia ,   Anesthesia Other Findings   Reproductive/Obstetrics negative OB ROS                            Anesthesia Physical Anesthesia Plan  ASA: III  Anesthesia Plan: MAC   Post-op Pain Management:    Induction: Intravenous  Airway Management Planned: Simple Face Mask  Additional Equipment:   Intra-op Plan:   Post-operative Plan:   Informed Consent: I have reviewed the patients History and Physical, chart, labs and discussed the procedure including the risks, benefits and alternatives for the proposed anesthesia with the patient or authorized representative who has indicated his/her understanding and acceptance.   Dental advisory given  Plan Discussed with: CRNA  Anesthesia Plan Comments:         Anesthesia Quick Evaluation

## 2016-12-20 NOTE — Progress Notes (Signed)
ANTICOAGULATION CONSULT NOTE - Initial Consult  Pharmacy Consult for warfarin Indication: atrial fibrillation  Allergies  Allergen Reactions  . Diphenhydramine Itching and Anxiety    Only with IV doses. Tolerates oral.  . Penicillins Other (See Comments)    Migraine Has patient had a PCN reaction causing immediate rash, facial/tongue/throat swelling, SOB or lightheadedness with hypotension: no Has patient had a PCN reaction causing severe rash involving mucus membranes or skin necrosis: No Has patient had a PCN reaction that required hospitalization No Has patient had a PCN reaction occurring within the last 10 years: No If all of the above answers are "NO", then may proceed with Cephalosporin use.  . Bupropion Other (See Comments)    Caused anxiety  . Adhesive [Tape] Rash    Please use paper tape  . Amoxicillin Rash    migraine  . Ativan [Lorazepam] Anxiety    Patient Measurements: Height: '5\' 10"'$  (177.8 cm) Weight: 168 lb 9.6 oz (76.5 kg) IBW/kg (Calculated) : 73  Vital Signs: Temp: 98 F (36.7 C) (04/16 1226) Temp Source: Oral (04/16 1226) BP: 105/86 (04/16 1615) Pulse Rate: 81 (04/16 1615)  Labs:  Recent Labs  12/20/16 1620 12/20/16 1708  HGB  --  11.9*  HCT  --  39.1  PLT  --  124*  LABPROT 28.3*  --   INR 2.59  --     Estimated Creatinine Clearance: 18.8 mL/min (A) (by C-G formula based on SCr of 4.25 mg/dL (H)).   Medical History: Past Medical History:  Diagnosis Date  . Acute blood loss anemia    a. 02/2016 due to groin hematoma. Rec 3 U PRBC.  Marland Kitchen Anemia   . Aortic heart valve prolapse 04/2013   a. s/p bioprosthetic AVR at time of CABG.  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 Bifascicular block   . CAD (coronary artery disease) 04/2013   a. 04/2013: s/p CABG x 2 (Y SVG -LAD & D2). b. 01/2015: NSTEMI s/p DES to LCx, BMS to SVG-LAD. c. NSTEMI 05/2015 during AF/AFL - non-flow limiting FFR. d. Low risk nuc 3/17. e. STEMI 6/17 after coming  off Plavix, s/p DES to SVG-LAD into native vessel.  . Carotid artery disease ([place])    a. 1-39% stenosis bilaterally in 06/2015.   Marland Kitchen Chronic respiratory failure ([place])   . COPD (chronic obstructive pulmonary disease) ([place])   . Dyspnea   . 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 b. He had a renal Tx from 2008 to 2011, but subsequent rejection - Gets HD in [place], [place] on MWF schedule.    . Hematoma    a. Large right groin hematoma after cath 02/2016 with associated ABL anemia.  . Hypertension   . Multiple sclerosis ([place])   . Nocturnal hypoxemia   . On home oxygen therapy    pt states he has not been wearing it  . PAF (paroxysmal atrial fibrillation) ([place])    a. h/o, placed on amiodarone 07/2016 due to recurrence.  . Paroxysmal atrial flutter ([place])    a. During 05/2015 admission - SVT, atrial flutter, and PAF.  Marland Kitchen Peripheral vascular disease ([place])    Cath in 01/2015 required 45 cm Destination Sheath  . Renal transplant failure and rejection   . 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 intolerant of bipap.  Marland Kitchen SVT (  supraventricular tachycardia) ([place])   . Valvular heart disease    a. 2D Echo 03/25/16: mild LVH, EF 50-55%, grade 2 Dd, AVR present with mild AI, mod MR, severely dilated LA, mildly dilated RV, mod RAE, PASP 72.    Medications:  See PTA medication history  Assessment: 61 y/o male with ESRD and significant cardiac history admitted for TEE today but was hypotensive and this was cancelled for today. Plan is for TEE and ablation tomorrow. Pharmacy consulted to manage warfarin for hx Afib.  INR is therapeutic at 2.59. Hgb down to 11.9 and platelets are low stable since last draw on 4/11.   PTA regimen: 3.75 mg daily, last dose at home 4/15  Goal of Therapy:  INR 2-3 Monitor platelets by anticoagulation protocol: Yes   Plan:  - Warfarin 3.75 mg PO tonight - Daily INR - Monitor for s/sx of  bleeding   Renold Genta, PharmD, BCPS Clinical Pharmacist Phone for tonight - [place] - [phone] 12/20/2016 5:42 PM

## 2016-12-20 NOTE — Progress Notes (Signed)
Patient HR 132. MD notified.

## 2016-12-20 NOTE — Progress Notes (Signed)
MD Allred at bedside, stated do not give pt IV fluid or blood pressure interventions. If any problems occur overnight call MD Allred. MD also stated do not check vital signs over night. Pt will be scheduled for 0730 ablation. Family stated they will be at bedside at 0600.  [name] [name]

## 2016-12-20 NOTE — H&P (Signed)
H&P    Patient ID: [name] MRN: [id number], DOB/AGE: [birth year] 61 y.o.  Admit date: 12/20/2016 Date of Consult: 12/20/2016   Primary Physician: Teressa Lower, MD Primary Cardiologist: Dr. Julianne Handler Electrophysiologist: Dr. Rayann Heman Admitting: Dr. Radford Pax  Reason for admission: hypotension  HPI: [name] is a 61 y.o. 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  Echo 05/27/15: EF 55-60%, no RWMA, mild AS/mild AI, mod MS, severely dilated LA. Nuclear stress test 11/2015 in prep for repeat transplant was low risk at [facility]. In 01/2016, he was cleared to stop Plavix but remained on ASA + Coumadin. Per notes, it was felt by the transplant team that he would need to be off Plavix to be considered for transplantation. He was admitted 02/16/16 with midsternal chest pain with radiation to his jaw/teeth. EKG showed anterior STEMI. LHC 02/15/16: acute occlusion of SVG-LAD at anastamosis, patent LCx with moderate restenosis, moderate RCA disease, patent Y graft to diagonal with moderate ostial stenosis, s/p angioplasty/DES to SVG-LAD extending into the native vessel - this was a difficult procedure c/b large right groin hematoma. Echo  02/15/16: EF 55-60%, Re-admitted to [facility] November 2017 with dyspnea and found to be back in atrial fib/flutter. He was placed on amiodarone and underwent DCCV on 07/12/16 with return to sinus. He has been followed in the atrial fib clinic. He has had paroxysms of atrial fib/flutter over the last 6 months. Cardioverted 11/11/16. Admitted to [facility] 11/15/16 with COPD exacerbation and pneumonia.  He was seen by Dr. Rayann Heman and planned for AFib ablation  given he was feeling so terrible out of rhythm.   He came today for his TEE in preparation for tomorrow's procedure, found to be hypotensive with SBP in 70's initially (he had HD today as usual), though without symptoms at rest here, reported to Dr. Radford Pax that he had been having some mild dizzy spells of late.  He also reported taking his Midodrine as instructed prior to HD today.  Dr. Radford Pax felt unable to proceed with TEE given very soft BP with no room for sedation, in discussion with Dr. Rayann Heman, he felt would be OK to discharge and would do TEE at the time of his procedure tomorrow with baseline hypotension.    In further discussion with the patient/wife byDr. Radford Pax, they report his baseline SBP 90's-10 range and given his persistent BP 70's-80's felt he should be admitted for observation.  He will be continued on his home medicines/oxygen, including his warfarin (with pharmacy), will keep him NPO after midnight and Dr. Rayann Heman will evaluate in the AM.  Past Medical History:  Diagnosis Date  . Acute blood loss anemia    a. 02/2016 due to groin hematoma. Rec 3 U PRBC.  Marland Kitchen Anemia   . Aortic heart valve prolapse 04/2013   a. s/p bioprosthetic AVR at time of CABG.  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 Bifascicular block   . CAD (coronary artery disease) 04/2013   a. 04/2013: s/p CABG x 2 (Y SVG -LAD & D2). b. 01/2015: NSTEMI s/p DES to LCx, BMS to SVG-LAD. c. NSTEMI 05/2015 during AF/AFL - non-flow limiting FFR. d. Low risk nuc 3/17. e.  STEMI 6/17 after coming off Plavix, s/p DES to SVG-LAD into native vessel.  . Carotid artery disease ([place])    a. 1-39% stenosis bilaterally in 06/2015.   Marland Kitchen Chronic respiratory failure ([place])   . COPD (chronic obstructive pulmonary disease) ([place])   . Dyspnea   . 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 b. He had a renal Tx from 2008 to 2011, but subsequent rejection - Gets HD in [place], [place] on MWF  schedule.    . Hematoma    a. Large right groin hematoma after cath 02/2016 with associated ABL anemia.  . Hypertension   . Multiple sclerosis ([place])   . Nocturnal hypoxemia   . On home oxygen therapy    pt states he has not been wearing it  . PAF (paroxysmal atrial fibrillation) ([place])    a. h/o, placed on amiodarone 07/2016 due to recurrence.  . Paroxysmal atrial flutter ([place])    a. During 05/2015 admission - SVT, atrial flutter, and PAF.  Marland Kitchen Peripheral vascular disease ([place])    Cath in 01/2015 required 45 cm [place]  . Renal transplant failure and rejection   . 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 intolerant of bipap.  Marland Kitchen SVT (supraventricular tachycardia) ([place])   . Valvular heart disease    a. 2D Echo 03/25/16: mild LVH, EF 50-55%, grade 2 Dd, AVR present with mild AI, mod MR, severely dilated LA, mildly dilated RV, mod RAE, PASP 72.     Surgical History:  Past Surgical History:  Procedure Laterality Date  . AV FISTULA PLACEMENT    . CARDIAC CATHETERIZATION N/A 05/26/2015   Procedure: Left Heart Cath and Coronary Angiography;  Surgeon: Leonie Man, MD;  Location: [place] CV LAB;  Service: Cardiovascular;  Laterality: N/A;  . CARDIAC CATHETERIZATION N/A 05/26/2015   Procedure: Intravascular Pressure Wire/FFR Study;  Surgeon: Leonie Man, MD;  Location: [place] [facility];  Service: Cardiovascular;  Laterality: N/A;  . CARDIAC CATHETERIZATION N/A 02/15/2016   Procedure: Left Heart Cath and Coronary Angiography;  Surgeon: Wellington Hampshire, MD;  Location: [place] [facility] LAB;  Service: Cardiovascular;  Laterality: N/A;  . CARDIOVERSION N/A 07/12/2016   Procedure: CARDIOVERSION;  Surgeon: Minus Breeding, MD;  Location: [facility] ENDOSCOPY;  Service: Cardiovascular;  Laterality: N/A;  . CARDIOVERSION N/A 11/11/2016   Procedure: CARDIOVERSION;  Surgeon: Fay Records, MD;  Location: [facility] ENDOSCOPY;  Service: Cardiovascular;  Laterality: N/A;  .  CARDIOVERSION N/A 12/09/2016   Procedure: CARDIOVERSION;  Surgeon: Sanda Klein, MD;  Location: [facility] ENDOSCOPY;  Service: Cardiovascular;  Laterality: N/A;  . CORONARY ARTERY BYPASS GRAFT    . excised squamous cells at rectum  2006  . flash     flash pulmonary edema  . HERNIA REPAIR  07/2011  . KIDNEY TRANSPLANT  08/2007  . KNEE SURGERY    . LEFT HEART CATHETERIZATION WITH CORONARY ANGIOGRAM N/A 01/09/2013   Procedure: LEFT HEART CATHETERIZATION WITH CORONARY ANGIOGRAM;  Surgeon: Burnell Blanks, MD;  Location: [facility] CATH LAB;  Service: Cardiovascular;  Laterality: N/A;  . TEE WITHOUT CARDIOVERSION N/A 11/11/2016   Procedure: TRANSESOPHAGEAL ECHOCARDIOGRAM (TEE);  Surgeon: Fay Records, MD;  Location: [place];  Service: Cardiovascular;  Laterality: N/A;  . TEE WITHOUT CARDIOVERSION N/A 12/09/2016   Procedure: TRANSESOPHAGEAL ECHOCARDIOGRAM (TEE);  Surgeon: Sanda Klein, MD;  Location:  [facility] ENDOSCOPY;  Service: Cardiovascular;  Laterality: N/A;     Prescriptions Prior to Admission  Medication Sig Dispense Refill Last Dose  . acetaminophen (TYLENOL) 500 MG tablet Take 1,000 mg by mouth every 6 (six) hours as needed for pain or fever.   Past Week at Unknown time  . albuterol (PROAIR HFA) 108 (90 Base) MCG/ACT inhaler Inhale 1-2 puffs into the lungs every 6 (six) hours as needed for shortness of breath.    Past Week at Unknown time  . atorvastatin (LIPITOR) 40 MG tablet Take 20 mg by mouth at bedtime.    12/19/2016 at Unknown time  . calcium acetate (PHOSLO) 667 MG capsule Take 4,002 mg by mouth 3 (three) times daily with meals.    12/19/2016 at Unknown time  . citalopram (CELEXA) 20 MG tablet Take 20 mg by mouth at bedtime.    12/19/2016 at Unknown time  . clopidogrel (PLAVIX) 75 MG tablet Take 1 tablet (75 mg total) by mouth daily. 90 tablet 3 12/19/2016 at Unknown time  . docusate sodium (COLACE) 100 MG capsule Take 200 mg by mouth 2 (two) times daily.    12/19/2016 at Unknown time  . folic acid  (FOLVITE) 1 MG tablet Take 1 mg by mouth daily.    12/19/2016 at Unknown time  . gabapentin (NEURONTIN) 300 MG capsule Take 300 mg by mouth at bedtime.    12/19/2016 at Unknown time  . guaiFENesin (MUCINEX) 600 MG 12 hr tablet Take 1 tablet (600 mg total) by mouth 2 (two) times daily. (Patient taking differently: Take 600 mg by mouth at bedtime. ) 14 tablet 0 12/19/2016 at Unknown time  . ipratropium-albuterol (DUONEB) 0.5-2.5 (3) MG/3ML SOLN Take 3 mLs by nebulization 2 (two) times daily.    12/19/2016 at Unknown time  . midodrine (PROAMATINE) 10 MG tablet Take 1 tablet (10 mg total) by mouth every Monday, Wednesday, and Friday with hemodialysis. (Patient taking differently: Take 10 mg by mouth See admin instructions. Take 1 tablet (10 mg) by mouth on Monday, Wednesday, Friday 1 1/2 hours before dialysis)   12/20/2016 at 0400  . multivitamin (RENA-VIT) TABS tablet Take 1 tablet by mouth daily. 30 tablet 1 12/19/2016 at Unknown time  . OXYGEN Inhale 4 L into the lungs continuous.   12/20/2016 at Unknown time  . oxymetazoline (AFRIN) 0.05 % nasal spray Place 1 spray into both nostrils 2 (two) times daily as needed for congestion.   12/19/2016 at Unknown time  . pantoprazole (PROTONIX) 40 MG tablet Take 1 tablet (40 mg total) by mouth daily. 30 tablet 0 12/19/2016 at Unknown time  . polyethylene glycol (MIRALAX / GLYCOLAX) packet Take 17 g by mouth at bedtime. Mix in 8 oz liquid and drink   12/19/2016 at Unknown time  . ranitidine (ZANTAC) 150 MG tablet Take 150 mg by mouth daily.    12/19/2016 at Unknown time  . warfarin (COUMADIN) 7.5 MG tablet Take 1/2-1 tablet by mouth daily as directed by coumadin clinic (Patient taking differently: take 1/2 tablet by mouth daily at bedtime or as directed by coumadin clinic) 30 tablet 1 12/19/2016 at Unknown time  . nitroGLYCERIN (NITROSTAT) 0.4 MG SL tablet Place 0.4 mg under the tongue every 5 (five) minutes as needed for chest pain (max 3 doses - if no relief call MD).     Taking  . sodium polystyrene (KAYEXALATE) 15 GM/60ML suspension Take 15 g by mouth See admin instructions. Take 60 ml (15 g) by mouth in the morning  on non-dialysis days - Sunday, Tuesday, Thursday, Saturday   Taking    Inpatient Medications:   Allergies:  Allergies  Allergen Reactions  . Diphenhydramine Itching and Anxiety    Only with IV doses. Tolerates oral.  . Penicillins Other (See Comments)    Migraine Has patient had a PCN reaction causing immediate rash, facial/tongue/throat swelling, SOB or lightheadedness with hypotension: no Has patient had a PCN reaction causing severe rash involving mucus membranes or skin necrosis: No Has patient had a PCN reaction that required hospitalization No Has patient had a PCN reaction occurring within the last 10 years: No If all of the above answers are "NO", then may proceed with Cephalosporin use.  . Bupropion Other (See Comments)    Caused anxiety  . Adhesive [Tape] Rash    Please use paper tape  . Amoxicillin Rash    migraine  . Ativan [Lorazepam] Anxiety    Social History   Social History  . Marital status: Married    Spouse name: N/A  . Number of children: N/A  . Years of education: N/A   Occupational History  . Disabled    Social History Main Topics  . Smoking status: Former Smoker    Packs/day: 2.00    Years: 20.00    Types: Cigarettes    Quit date: 08/06/1998  . Smokeless tobacco: Never Used  . Alcohol use No  . Drug use: No  . Sexual activity: Not on file   Other Topics Concern  . Not on file   Social History Narrative   No history of premature CAD in either parents or siblings. However, his maternal grandfather and 2 maternal uncles had coronary artery disease.     Family History  Problem Relation Age of Onset  . Heart failure Mother     started in 39s  . Emphysema Mother     smoked  . Diabetes Father   . Lupus Sister   . Heart attack Maternal Grandfather   . Heart attack Maternal Uncle   . Heart  attack Maternal Aunt   . Hypertension Maternal Aunt   . Stroke Neg Hx      Review of Systems: All other systems reviewed and are otherwise negative except as noted above.  Physical Exam: Vitals:   12/20/16 1226 12/20/16 1231  BP: (!) 68/54 (!) 85/55  Pulse: (!) 133   Resp: (!) 21   Temp: 98 F (36.7 C)   TempSrc: Oral   SpO2: 98%      GEN- The patient is well appearing, alert and oriented x 3 today.   HEENT: normocephalic, atraumatic; sclera clear, conjunctiva pink; hearing intact; neck supple, no JVP Lymph- no cervical lymphadenopathy Lungs- CTA b/l, normal work of breathing.  No wheezes, rales, rhonchi Heart- IRRR, tachycardic , no murmurs, rubs or gallops, PMI not laterally displaced GI- soft, non-tender, non-distended, bowel sounds present Extremities- no clubbing, cyanosis, or edema, LUE AVF, bandage in place, dry. MS- no significant deformity or atrophy Skin- warm and dry, no rash or lesion Psych- euthymic mood, full affect Neuro- no gross deficits observed  Labs:   Lab Results  Component Value Date   WBC 9.4 12/15/2016   HGB 12.2 (L) 12/09/2016   HCT 39.1 12/15/2016   MCV 101 (H) 12/15/2016   PLT 139 (L) 12/15/2016    Recent Labs Lab 12/15/16 1558  NA 141  K 4.8  CL 94*  CO2 34*  BUN 31*  CREATININE 4.25*  CALCIUM 8.0*  GLUCOSE 97  Telemetry: reviewed by Dr. Radford Pax: AFlutter 130's  12/09/16: TEE Study Conclusions - Left ventricle: The cavity size was normal. Wall thickness was   increased in a pattern of moderate LVH. There was concentric   hypertrophy. Systolic function was mildly reduced. The estimated   ejection fraction was in the range of 45% to 50%. No evidence of   thrombus. - Aortic valve: A bioprosthesis was present and functioning   normally. There was mild regurgitation. There was no significant   perivalvular regurgitation. - Mitral valve: Severely calcified annulus. Mildly thickened   leaflets . The findings are consistent with  trivial stenosis.   There was severe regurgitation directed eccentrically and   posteriorly. Severe regurgitation is suggested by pulmonary vein   systolic flow reversal. - Left atrium: The atrium was mildly to moderately dilated. No   evidence of thrombus in the atrial cavity or appendage. No   spontaneous echo contrast was observed. - Right ventricle: The cavity size was mildly dilated. - Right atrium: The atrium was mildly dilated. - Atrial septum: No defect or patent foramen ovale was identified. - Tricuspid valve: No evidence of vegetation. There was   regurgitation directed eccentrically and toward the free wall.   There was moderate-severe perivalvular regurgitation. Impressions: - Successful cardioversion. No cardiac source of emboli was   indentified.   Assessment and Plan:   The patient was seen and examined by Dr. Radford Pax,  1. Hypotension     Patient has baseline hypotension, though today more so then usual and will admit for observation     No plans for IVF or intervention, he had HD today and suspect this will improve   2. AFlutter w/FVR     No intervention, pending ablation tomorrow      Continue warfarin to keep therapeutic  3. CAD     No c/o CP  4. VHF      w/bioprosthetic AVR  5. Chronic respiratory failure     On continuous home O2, will continue here 4L as reported     Hx of OSA, intolerant of CPAP as well  6. ESRF on HD     Pending length of stay will ask to see   Signed, Tommye Standard, PA-C 12/20/2016 2:09 PM  Patient seen and independently examined with Fransico Him, MD. We discussed all aspects of the encounter. I agree with the assessment and plan as stated above.  The patient has problems with hypotension and takes midodrine on dialysis days.  His wife states that he has had problems with worsening hypotension over the past weekend as low as a SBP of 93mHg with some mild dizziness but no syncope.  He went to HD this am and then presented to Endo  for TEE prior to getting an afib ablation tomorrow.  On arrival his SBP was 645mg and went up to 8563m.  TEE cancelled for today as we cannot sedate him due to hypotension.  Will admit overnight for observation, as his wife is very learly about taking him home due to low BP and I think this is wise since he is a fall risk with hypotension.  We will let him equilibrate his volume status overnight.  Do not start on pressors of give IVF unless patient becomes symptomatic.  Hopefully he will be stable for afib ablation in the am as his HR has been very difficult to control and has been in the 130's for several weeks.  Signed: TraFransico HimD [facility] 12/20/2016

## 2016-12-20 NOTE — Progress Notes (Signed)
MD called back and stated okay to give plavix  Odie Rauen Leory Plowman

## 2016-12-20 NOTE — Progress Notes (Signed)
Patient admitted to Endo unit. Patient had dialysis this am. Patient hypotensive (see flowsheets). Spoke with Dr. Radford Pax about BP. Decision made to cancel procedure and add TEE to ablation tomorrow morning. Due to BP's being so low, patient will be admitted. Will continue to monitor.

## 2016-12-21 ENCOUNTER — Encounter (HOSPITAL_COMMUNITY)
Admission: RE | Disposition: A | Discharge status: Home / Self Care | Payer: Self-pay | Source: Ambulatory Visit | Attending: Cardiology

## 2016-12-21 ENCOUNTER — Ambulatory Visit (HOSPITAL_COMMUNITY): Admission: RE | Admit: 2016-12-21 | Payer: Medicare Other | Source: Ambulatory Visit | Admitting: Internal Medicine

## 2016-12-21 DIAGNOSIS — I481 Persistent atrial fibrillation: Secondary | ICD-10-CM | POA: Diagnosis not present

## 2016-12-21 DIAGNOSIS — I4892 Unspecified atrial flutter: Secondary | ICD-10-CM | POA: Diagnosis not present

## 2016-12-21 DIAGNOSIS — I25810 Atherosclerosis of coronary artery bypass graft(s) without angina pectoris: Secondary | ICD-10-CM | POA: Diagnosis not present

## 2016-12-21 DIAGNOSIS — I950 Idiopathic hypotension: Secondary | ICD-10-CM | POA: Diagnosis not present

## 2016-12-21 DIAGNOSIS — I483 Typical atrial flutter: Secondary | ICD-10-CM | POA: Diagnosis not present

## 2016-12-21 DIAGNOSIS — T8611 Kidney transplant rejection: Secondary | ICD-10-CM | POA: Diagnosis not present

## 2016-12-21 DIAGNOSIS — I959 Hypotension, unspecified: Secondary | ICD-10-CM | POA: Diagnosis not present

## 2016-12-21 DIAGNOSIS — N186 End stage renal disease: Secondary | ICD-10-CM | POA: Diagnosis not present

## 2016-12-21 DIAGNOSIS — I120 Hypertensive chronic kidney disease with stage 5 chronic kidney disease or end stage renal disease: Secondary | ICD-10-CM | POA: Diagnosis not present

## 2016-12-21 LAB — PROTIME-INR
INR: 2.51
Prothrombin Time: 27.6 seconds — ABNORMAL HIGH (ref 11.4–15.2)

## 2016-12-21 LAB — BASIC METABOLIC PANEL
ANION GAP: 10 (ref 5–15)
BUN: 44 mg/dL — ABNORMAL HIGH (ref 6–20)
CALCIUM: 7.6 mg/dL — AB (ref 8.9–10.3)
CO2: 33 mmol/L — AB (ref 22–32)
Chloride: 96 mmol/L — ABNORMAL LOW (ref 101–111)
Creatinine, Ser: 6.39 mg/dL — ABNORMAL HIGH (ref 0.61–1.24)
GFR calc Af Amer: 10 mL/min — ABNORMAL LOW (ref >60)
GFR calc non Af Amer: 8 mL/min — ABNORMAL LOW (ref >60)
Glucose, Bld: 109 mg/dL — ABNORMAL HIGH (ref 65–99)
Potassium: 5.1 mmol/L (ref 3.5–5.1)
Sodium: 139 mmol/L (ref 135–145)

## 2016-12-21 SURGERY — ATRIAL FIBRILLATION ABLATION
Anesthesia: Monitor Anesthesia Care

## 2016-12-21 MED ORDER — SODIUM CHLORIDE 0.9 % IV SOLN
INTRAVENOUS | Status: DC
  Administered 2016-12-21: 07:00:00 via INTRAVENOUS

## 2016-12-21 MED ORDER — MIDODRINE HCL 10 MG PO TABS: 10.0000 mg | ORAL_TABLET | Freq: Every day | ORAL | 6 refills | Status: DC

## 2016-12-21 MED ORDER — WARFARIN SODIUM 7.5 MG PO TABS: 3.7500 mg | ORAL_TABLET | Freq: Every day | ORAL | Status: DC

## 2016-12-21 MED ORDER — MIDODRINE HCL 5 MG PO TABS
10.0000 mg | ORAL_TABLET | Freq: Every day | ORAL | Status: DC
  Administered 2016-12-21: 10 mg via ORAL
  Filled 2016-12-21: qty 2

## 2016-12-21 MED ORDER — HEPARIN SODIUM (PORCINE) 1000 UNIT/ML IJ SOLN
INTRAMUSCULAR | Status: AC
  Filled 2016-12-21: qty 1

## 2016-12-21 MED ORDER — ALUM & MAG HYDROXIDE-SIMETH 200-200-20 MG/5ML PO SUSP
15.0000 mL | Freq: Once | ORAL | Status: AC
  Administered 2016-12-21: 15 mL via ORAL
  Filled 2016-12-21: qty 30

## 2016-12-21 NOTE — H&P (Signed)
[name] is a 61 y.o. male who has presented today for surgery, with the diagnosis of atrial flutter. The various methods of treatment have been discussed with the patient and family. After consideration of risks, benefits and other options for treatment, the patient has consented to Procedure(s): TRANSESOPHAGEAL ECHOCARDIOGRAM (TEE) (N/A) as a surgical intervention . The patient's history has been reviewed, patient examined, no change in status, stable for surgery. I have reviewed the patient's chart and labs. Questions were answered to the patient's satisfaction.   Tali Coster C. Oval Linsey, MD, [facility]  12/21/2016 7:55 AM

## 2016-12-21 NOTE — H&P (View-Only) (Signed)
Electrophysiology Office Note   Date:  12/17/2016   ID:  [name], DOB March 17, 1956, MRN [id number]  PCP:  Teressa Lower, MD  Cardiologist:  Dr Angelena Form Primary Electrophysiologist: Thompson Grayer, MD    CC: atrial flutter   History of Present Illness: [name] is a 61 y.o. male who presents today for electrophysiology evaluation.   The patient is referred today by [name] NP for consultation regarding complex atrial arrhythmias.  He has a very complicated medical history which includes CAD s/p CABG with multiple stents, prior AVR (bioprosthetic), ESRD, multiple sclerosis, OSA, home O2 due to chronic lung disease, and atrial arrhythmias.   He was hospitalized 07/2016 for atrial flutter.  He has been treated with high dose amiodarone with progressive SOB.  He has had atrial flutter documented  In January and also in March despite ongoing high doses of amiodarone.  On review, I see very frequent typical appearing atrial flutter.  There is only 1 ekg which shows afib.  This was 02/26/16.  He carries diagnosis of afib and atrial flutter.  Both rate and rhythm control have been difficult.  He has hypotension with dialysis.  EKG today reveals typical appearing atrial flutter with V rates 130s.  He has worsening SOB and fatigue.  He feels "aweful" with his atrial arrhythmias.  Today, he denies symptoms of palpitations, chest pain, orthopnea, PND, lower extremity edema, claudication,  presyncope, syncope, bleeding, or neurologic sequela. The patient is tolerating medications without difficulties and is otherwise without complaint today.    Past Medical History:  Diagnosis Date  . Acute blood loss anemia    a. 02/2016 due to groin hematoma. Rec 3 U PRBC.  Marland Kitchen Anemia   . Aortic heart valve prolapse 04/2013   a. s/p bioprosthetic AVR at time of CABG.  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 Bifascicular block   . CAD (coronary artery disease) 04/2013   a. 04/2013: s/p CABG  x 2 (Y SVG -LAD & D2). b. 01/2015: NSTEMI s/p DES to LCx, BMS to SVG-LAD. c. NSTEMI 05/2015 during AF/AFL - non-flow limiting FFR. d. Low risk nuc 3/17. e. STEMI 6/17 after coming off Plavix, s/p DES to SVG-LAD into native vessel.  . Carotid artery disease ([place])    a. 1-39% stenosis bilaterally in 06/2015.   Marland Kitchen Chronic respiratory failure ([place])   . COPD (chronic obstructive pulmonary disease) ([place])   . Dyspnea   . 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 b. He had a renal Tx from 2008 to 2011, but subsequent rejection - Gets HD in [place], [place] on MWF schedule.    . Hematoma    a. Large right groin hematoma after cath 02/2016 with associated ABL anemia.  . Hypertension   . Multiple sclerosis ([place])   . Nocturnal hypoxemia   . On home oxygen therapy    pt states he has not been wearing it  . PAF (paroxysmal atrial fibrillation) ([place])    a. h/o, placed on amiodarone 07/2016 due to recurrence.  . Paroxysmal atrial flutter ([place])    a. During 05/2015 admission - SVT, atrial flutter, and PAF.  Marland Kitchen Peripheral vascular disease ([place])    Cath in 01/2015 required 45 cm [place]  . Renal transplant failure and rejection   . 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 intolerant  of bipap.  Marland Kitchen SVT (supraventricular tachycardia) ([place])   . Valvular heart disease    a. 2D Echo 03/25/16: mild LVH, EF 50-55%, grade 2 Dd, AVR present with mild AI, mod MR, severely dilated LA, mildly dilated RV, mod RAE, PASP 72.   Past Surgical History:  Procedure Laterality Date  . AV FISTULA PLACEMENT    . CARDIAC CATHETERIZATION N/A 05/26/2015   Procedure: Left Heart Cath and Coronary Angiography;  Surgeon: Leonie Man, MD;  Location: [facility] CV LAB;  Service: Cardiovascular;  Laterality: N/A;  . CARDIAC CATHETERIZATION N/A 05/26/2015   Procedure: Intravascular Pressure Wire/FFR Study;  Surgeon: Leonie Man, MD;  Location: [facility] CV LAB;  Service: Cardiovascular;  Laterality: N/A;  . CARDIAC CATHETERIZATION N/A 02/15/2016   Procedure: Left Heart Cath and Coronary Angiography;  Surgeon: Wellington Hampshire, MD;  Location: [facility] CV LAB;  Service: Cardiovascular;  Laterality: N/A;  . CARDIOVERSION N/A 07/12/2016   Procedure: CARDIOVERSION;  Surgeon: Minus Breeding, MD;  Location: [facility] ENDOSCOPY;  Service: Cardiovascular;  Laterality: N/A;  . CARDIOVERSION N/A 11/11/2016   Procedure: CARDIOVERSION;  Surgeon: Fay Records, MD;  Location: [facility] ENDOSCOPY;  Service: Cardiovascular;  Laterality: N/A;  . CARDIOVERSION N/A 12/09/2016   Procedure: CARDIOVERSION;  Surgeon: Sanda Klein, MD;  Location: [facility] ENDOSCOPY;  Service: Cardiovascular;  Laterality: N/A;  . CORONARY ARTERY BYPASS GRAFT    . excised squamous cells at rectum  2006  . flash     flash pulmonary edema  . HERNIA REPAIR  07/2011  . KIDNEY TRANSPLANT  08/2007  . KNEE SURGERY    . LEFT HEART CATHETERIZATION WITH CORONARY ANGIOGRAM N/A 01/09/2013   Procedure: LEFT HEART CATHETERIZATION WITH CORONARY ANGIOGRAM;  Surgeon: Burnell Blanks, MD;  Location: [facility] CATH LAB;  Service: Cardiovascular;  Laterality: N/A;  . TEE WITHOUT CARDIOVERSION N/A 11/11/2016   Procedure: TRANSESOPHAGEAL ECHOCARDIOGRAM (TEE);  Surgeon: Fay Records, MD;  Location: [facility];  Service: Cardiovascular;  Laterality: N/A;  . TEE WITHOUT CARDIOVERSION N/A 12/09/2016   Procedure: TRANSESOPHAGEAL ECHOCARDIOGRAM (TEE);  Surgeon: Sanda Klein, MD;  Location: [facility] ENDOSCOPY;  Service: Cardiovascular;  Laterality: N/A;     Current Outpatient Prescriptions  Medication Sig Dispense Refill  . acetaminophen (TYLENOL) 500 MG tablet Take 1,000 mg by mouth every 6 (six) hours as needed for pain or fever.    Marland Kitchen albuterol (PROAIR HFA) 108 (90 Base) MCG/ACT inhaler Inhale 1-2 puffs into the lungs every 6 (six) hours as needed for shortness of breath.     Marland Kitchen atorvastatin (LIPITOR) 40 MG tablet Take 20 mg  by mouth at bedtime.     . calcium acetate (PHOSLO) 667 MG capsule Take 4,002 mg by mouth 3 (three) times daily with meals.     . citalopram (CELEXA) 20 MG tablet Take 20 mg by mouth at bedtime.     . clopidogrel (PLAVIX) 75 MG tablet Take 1 tablet (75 mg total) by mouth daily. 90 tablet 3  . docusate sodium (COLACE) 100 MG capsule Take 200 mg by mouth 2 (two) times daily.     . folic acid (FOLVITE) 1 MG tablet Take 1 mg by mouth daily.     Marland Kitchen gabapentin (NEURONTIN) 300 MG capsule Take 300 mg by mouth at bedtime.     Marland Kitchen guaiFENesin (MUCINEX) 600 MG 12 hr tablet Take 1 tablet (600 mg total) by mouth 2 (two) times daily. (Patient taking differently: Take 600 mg by mouth at bedtime. ) 14 tablet 0  .  ipratropium-albuterol (DUONEB) 0.5-2.5 (3) MG/3ML SOLN Take 3 mLs by nebulization 2 (two) times daily.     . midodrine (PROAMATINE) 10 MG tablet Take 1 tablet (10 mg total) by mouth every Monday, Wednesday, and Friday with hemodialysis. (Patient taking differently: Take 10 mg by mouth See admin instructions. Take 1 tablet (10 mg) by mouth on Monday, Wednesday, Friday 1 1/2 hours before dialysis)    . multivitamin (RENA-VIT) TABS tablet Take 1 tablet by mouth daily. 30 tablet 1  . nitroGLYCERIN (NITROSTAT) 0.4 MG SL tablet Place 0.4 mg under the tongue every 5 (five) minutes as needed for chest pain (max 3 doses - if no relief call MD).     . OXYGEN Inhale 4 L into the lungs continuous.    . pantoprazole (PROTONIX) 40 MG tablet Take 1 tablet (40 mg total) by mouth daily. 30 tablet 0  . polyethylene glycol (MIRALAX / GLYCOLAX) packet Take 17 g by mouth at bedtime. Mix in 8 oz liquid and drink    . ranitidine (ZANTAC) 150 MG tablet Take 150 mg by mouth daily.     . sodium polystyrene (KAYEXALATE) 15 GM/60ML suspension Take 15 g by mouth See admin instructions. Take 60 ml (15 g) by mouth in the morning on non-dialysis days - Sunday, Tuesday, Thursday, Saturday    . warfarin (COUMADIN) 7.5 MG tablet Take 1/2-1  tablet by mouth daily as directed by coumadin clinic (Patient taking differently: take 1/2 tablet by mouth daily at bedtime or as directed by coumadin clinic) 30 tablet 1  . oxymetazoline (AFRIN) 0.05 % nasal spray Place 1 spray into both nostrils 2 (two) times daily as needed for congestion.     No current facility-administered medications for this visit.     Allergies:   Diphenhydramine; Penicillins; Bupropion; Adhesive [tape]; Amoxicillin; and Ativan [lorazepam]   Social History:  The patient  reports that he quit smoking about 18 years ago. His smoking use included Cigarettes. He has a 40.00 pack-year smoking history. He has never used smokeless tobacco. He reports that he does not drink alcohol or use drugs.   Family History:  The patient's  family history includes Diabetes in his father; Emphysema in his mother; Heart attack in his maternal aunt, maternal grandfather, and maternal uncle; Heart failure in his mother; Hypertension in his maternal aunt; Lupus in his sister.    ROS:  Please see the history of present illness.   All other systems are personally reviewed and negative.    PHYSICAL EXAM: VS:  BP 94/68   Pulse (!) 121   Ht '5\' 10"'$  (1.778 m)   Wt 173 lb 9.6 oz (78.7 kg)   SpO2 95%   BMI 24.91 kg/m  , BMI Body mass index is 24.91 kg/m. GEN: chronically ill in no acute distress  HEENT: normal  Neck: no JVD, carotid bruits, or masses Cardiac: tachycardic irregular rhythm;  no edema  Respiratory:  Very poor air movement throughout, wearing O2, normal work of breathing GI: soft, nontender, nondistended, + BS MS: diffusec muscle atrophy  Skin: warm and dry  Neuro:  Strength and sensation are intact Psych: euthymic mood, full affect  EKG:  EKG is ordered today. The ekg ordered today is personally reviewed and shows typical appearing atrial flutter, V rates 130s, RBBB, Inferior infarct, anterior infarct   Recent Labs: 11/16/2016: TSH 3.957 12/05/2016: B Natriuretic Peptide  1,624.4 12/06/2016: ALT 38 12/08/2016: Magnesium 2.4 12/09/2016: Hemoglobin 12.2 12/15/2016: BUN 31; Creatinine, Ser 4.25; Platelets 139; Potassium  4.8; Sodium 141  personally reviewed   Lipid Panel     Component Value Date/Time   CHOL 98 (L) 03/05/2016 1125   TRIG 143 03/05/2016 1125   HDL 32 (L) 03/05/2016 1125   CHOLHDL 3.1 03/05/2016 1125   VLDL 29 03/05/2016 1125   LDLCALC 37 03/05/2016 1125   personally reviewed   Wt Readings from Last 3 Encounters:  12/15/16 173 lb 9.6 oz (78.7 kg)  12/14/16 172 lb 9.6 oz (78.3 kg)  12/09/16 174 lb 2.6 oz (79 kg)      Other studies personally reviewed: Additional studies/ records that were reviewed today include: AF clinic notes, prior echo, ekgs  Review of the above records today demonstrates: as above  Echo reveals EF 45%, prosthetic aortiv valve, severe MR, mild to moderate LA enlargement, moderate to severe tricuspid valve regurg   ASSESSMENT AND PLAN:  1.  Typical appearing atrial flutter/ atrial fibrillation The patient has medicine refractory arrhythmias. I primarily see atrial flutter with very difficult to control V rates.  He has only one ekg with afib.  I worry about high dose amiodarone for him.  He has significant lung disease.  I do not think that he will do well with uncontrolled V rates long term.  Additional rate control is limited by hypotension.  He is not a good candidate for AV nodal ablation given ESRD.  I would advise atrial flutter ablation.  In addition, I think that he may receive benefit from pulmonary vein isolation, though I worry about long term durability given his significant valvular heart disease. Therapeutic strategies for afib and typical appearing atrial flutter including medicine and ablation were discussed in detail with the patient today. Risk, benefits, and alternatives to EP study and radiofrequency ablation were also discussed in detail today. These risks include but are not limited to stroke, bleeding,  vascular damage, tamponade, perforation, damage to the esophagus, lungs, and other structures, pulmonary vein stenosis, worsening renal function, and death. The patient understands these risk and wishes to proceed.  He understands that risks are increased given his multiple comoribidities including significant lung disease.  As his atrial flutter may be left atrial, will proceed with another TEE prior to ablation to exclude LA thrombus.  This may also be helpful in further evaluation of his MR.  Weekly INRs prior to ablation.  Given his overall poor tolerate of RVR, we will try to move his ablation up to the soonest time.  Very complicated gentleman A high level of decision making was required for this visit.  Current medicines are reviewed at length with the patient today.   The patient does not have concerns regarding his medicines.  The following changes were made today:  none  Labs/ tests ordered today include:  Orders Placed This Encounter  Procedures  . Basic metabolic panel  . CBC with Differential  . EKG 12-Lead     Signed, Thompson Grayer, MD  12/17/2016 11:36 PM     [place] [address] [phone] (office) [phone] (fax)

## 2016-12-21 NOTE — Progress Notes (Signed)
SUBJECTIVE: The patient is at his chronic baseline today.  At this time, he denies chest pain, dizziness, presyncope, syncope or any new concerns.  SOB is at baseline  . atorvastatin  20 mg Oral QHS  . calcium acetate  4,002 mg Oral TID WC  . citalopram  20 mg Oral QHS  . clopidogrel  75 mg Oral Daily  . docusate sodium  200 mg Oral BID  . famotidine  20 mg Oral Daily  . folic acid  1 mg Oral Daily  . gabapentin  300 mg Oral QHS  . guaiFENesin  600 mg Oral QHS  . ipratropium-albuterol  3 mL Nebulization BID  . midodrine  10 mg Oral Daily  . multivitamin  1 tablet Oral QHS  . pantoprazole  40 mg Oral Daily  . sodium polystyrene  15 g Oral Q T,Th,S,Su  . Warfarin - Pharmacist Dosing Inpatient   Does not apply q1800     OBJECTIVE: Physical Exam: Vitals:   12/20/16 1933 12/21/16 0805 12/21/16 0819 12/21/16 0832  BP:  (!) 73/57 (!) 79/59 (!) 82/65  Pulse:   86   Resp:      Temp:   98.3 F (36.8 C) 98 F (36.7 C)  TempSrc:   Oral Oral  SpO2: 97%  100% 99%  Weight:      Height:        Intake/Output Summary (Last 24 hours) at 12/21/16 0854 Last data filed at 12/20/16 2230  Gross per 24 hour  Intake              480 ml  Output                0 ml  Net              480 ml    Telemetry reveals atrial flutter, V rates 120s  GEN- The patient is chronically ill appearing, alert and oriented x 3 today.   Head- normocephalic, atraumatic Eyes-  Sclera clear, conjunctiva pink Ears- hearing intact Oropharynx- clear Neck- supple,   Lungs- decreased BS throughout, normal work of breathing on O2 Heart- tachycardic regular rhythm GI- soft, NT, ND, + BS Extremities- no clubbing, cyanosis, or edema Skin- no rash or lesion Psych- euthymic mood, full affect Neuro- strength and sensation are intact  LABS: Basic Metabolic Panel:  Recent Labs  12/21/16 0315  NA 139  K 5.1  CL 96*  CO2 33*  GLUCOSE 109*  BUN 44*  CREATININE 6.39*  CALCIUM 7.6*    CBC:  Recent Labs  12/20/16 1708  WBC 9.1  HGB 11.9*  HCT 39.1  MCV 107.1*  PLT 124*   RADIOLOGY: Dg Chest 2 View  Result Date: 12/05/2016 CLINICAL DATA:  Worsening dyspnea for 2 days. EXAM: CHEST  2 VIEW COMPARISON:  11/15/2016 FINDINGS: Extensive interstitial fluid or thickening. No confluent airspace consolidation. Small right effusion, extending into the fissures. Stable cardiomegaly.  Prior sternotomy and aortic valvuloplasty. IMPRESSION: Extensive interstitial fluid and small right pleural effusion. No focal airspace consolidation. Stable cardiomegaly. Electronically Signed   By: Andreas Newport M.D.   On: 12/05/2016 22:25   Dg Esophagus  Result Date: 12/07/2016 CLINICAL DATA:  Difficulty swallowing in a scheduled for transesophageal echocardiogram EXAM: ESOPHOGRAM/BARIUM SWALLOW TECHNIQUE: Single contrast examination was performed using  thin barium. FLUOROSCOPY TIME:  Fluoroscopy Time:  0 minutes and 42 seconds. Radiation Exposure Index (if provided by the fluoroscopic device): 4.1 mGy Number of Acquired Spot Images: 0 COMPARISON:  None. FINDINGS: Due to patient relative immobility , single contrast study was performed. Fluoro table was placed in a 45 degree upright position and the patient was turned LPO relative to the fluoro table. He was then given thin barium to drink and monitored fluoroscopically. There is no evidence for esophageal mass or stricture. No esophageal diverticulum. 13 mm barium tablet passed readily into the stomach when taken with water. IMPRESSION: 1. No evidence for esophageal narrowing or stricture. Electronically Signed   By: Misty Stanley M.D.   On: 12/07/2016 14:21   Dg Chest Port 1 View  Result Date: 12/08/2016 CLINICAL DATA:  Shortness of breath, evaluate for pulmonary edema EXAM: PORTABLE CHEST 1 VIEW COMPARISON:  12/06/2016 FINDINGS: Post sternotomy changes with fracture through for sternal wire as before, wire fragment superior to the medial end of right clavicle. There is  mild cardiomegaly. No overt edema. Possible tiny right effusion. Aortic atherosclerosis. IMPRESSION: Mild cardiomegaly without overt failure. Possible tiny right pleural effusion. Electronically Signed   By: Donavan Foil M.D.   On: 12/08/2016 18:18   Dg Chest Port 1 View  Result Date: 12/06/2016 CLINICAL DATA:  Follow-up pulmonary edema EXAM: PORTABLE CHEST 1 VIEW COMPARISON:  PA and lateral chest x-ray of December 05, 2016 FINDINGS: The lungs are well-expanded. The interstitial markings remain prominent but are less conspicuous today. There is a small amount of fluid in the minor fissure. The cardiac silhouette remains enlarged. The pulmonary vascularity remains engorged. There is calcification in the wall of the aortic arch. The patient has undergone previous median sternotomy. Curvature of the lower thoracic spine is new since yesterday's study and likely reflects patient positioning. IMPRESSION: Mild improvement in pulmonary interstitial edema. No alveolar pneumonia. Stable cardiomegaly with central pulmonary vascular congestion. Thoracic aortic atherosclerosis. Electronically Signed   By: David  Martinique M.D.   On: 12/06/2016 15:30    ASSESSMENT AND PLAN:   The patient is chronically ill with CAD, ESRD, advanced pulmonary disease, hypotension, valvular heart disease and refractory atrial arrhythmias.   Unfortunately, he is not a candidate for ablation today as anesthesia finds him too high risk for sedation.  I agree with their assessment.  I have spoken with Dr Justin Mend who knows the patient well.  He and I agree that the patient is in a palliative state.  We have no real options to improve his overall condition.  Dr Justin Mend does NOT wish to increase his dry weight or give additional fluids due to concerns of his O2 dependant respiratory failure.   I did not give fluids overnight in hopes that we would be able to sedate him from a respiratory standpoint.  Unfortunately, he remains profoundly hypotensive but  asymptomatic.  I did give him a 226m NS bolus this am.  Upon discussion with Dr WJustin Mend I have also increased midodrine to '10mg'$  daily.  We both feel that there are no other available interventions presently.  I have spoken with patient and his family and we all agree that he is approaching a palliative state.  They would like for him to go home today.  He wishes to remain full code but will think about this further.  Dr WJustin Mendwill also discuss palliative options as an outpatient.  Per patients request, [facility] a [facility] for a high risk procedure there.  JThompson Grayer MD 12/21/2016 8:54 AM

## 2016-12-21 NOTE — Progress Notes (Signed)
Per report during shift change  Dr. Rayann Heman ordered not to check B/P overnight and not to give fluids.

## 2016-12-21 NOTE — Progress Notes (Signed)
This RN directly ask  Dr. Rayann Heman this morning around 06:00 am  if I can  hang the  NS at 50 ml/ hr for pre-op.  Dr Rayann Heman verbally told me not  to give it . Patient alert and oriented X 4. Ambulatory to the bathroom with one assist. Family at bedside before the procedure.

## 2016-12-21 NOTE — Progress Notes (Addendum)
Pt in cath holding. RFA has been postponed due to SBP in the 60's and 70's.  250cc NS bolus ordered by Dr Rayann Heman is infusing. Will Tx back to 3E when complete. BP 73/57. Dr Rayann Heman iat bedside and asks for Pt to be returned to 3E06.

## 2016-12-21 NOTE — Progress Notes (Signed)
Pt is alert and oriented with family and MD at bedside, TEE has been cancelled and plan to go home with palliative and consult Nephrologist for increasing Midrine as long as B/p is stable.

## 2016-12-21 NOTE — Discharge Instructions (Signed)

## 2016-12-21 NOTE — Progress Notes (Signed)
Asymptomatic hypotension overnight (baseline per patient and family).  Remains in atrial flutter with V rates 130s. Will proceed with ablation this am.  Thompson Grayer MD, [facility] 12/21/2016 6:04 AM

## 2016-12-21 NOTE — Progress Notes (Signed)
Pt transferred to cath labs for procedure

## 2016-12-21 NOTE — Discharge Summary (Signed)
Discharge Summary    Patient ID: [name],  MRN: [id number], DOB/AGE: 62-Aug-1957 62 y.o.  Admit date: 12/20/2016 Discharge date: 12/21/2016  Primary Care Provider: [place] Cardiologist:  Dr Angelena Form Primary Electrophysiologist: Thompson Grayer, MD        Discharge Diagnoses    Active Problems:   Hypotension   Allergies Allergies  Allergen Reactions  . Diphenhydramine Itching and Anxiety    Only with IV doses. Tolerates oral.  . Penicillins Other (See Comments)    Migraine Has patient had a PCN reaction causing immediate rash, facial/tongue/throat swelling, SOB or lightheadedness with hypotension: no Has patient had a PCN reaction causing severe rash involving mucus membranes or skin necrosis: No Has patient had a PCN reaction that required hospitalization No Has patient had a PCN reaction occurring within the last 10 years: No If all of the above answers are "NO", then may proceed with Cephalosporin use.  . Bupropion Other (See Comments)    Caused anxiety  . Adhesive [Tape] Rash    Please use paper tape  . Amoxicillin Rash    migraine  . Ativan [Lorazepam] Anxiety     History of Present Illness     [name] is a 61 y.o. 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 Nuclear stress test 11/2015 in prep for repeat transplant was low risk at [facility]. In 01/2016, he was cleared to  stop Plavix but remained on ASA + Coumadin. Per notes, it was felt by the transplant team that he would need to be off Plavix to be considered for transplantation. He was admitted 02/16/16 with midsternal chest pain with radiation to his jaw/teeth. EKG showed anterior STEMI. LHC 02/15/16: acute occlusion of SVG-LAD at anastamosis, patent LCx with moderate restenosis, moderate RCA disease, patent Y graft to diagonal with moderate ostial stenosis, s/p angioplasty/DES to SVG-LAD extending into the native vessel - this was a difficult procedure c/b large right groin hematoma. Echo 02/15/16: EF 55-60%.  He was re-admitted to [facility] in 07/2016 with dyspnea and found to be back in atrial fib/flutter. He was placed on amiodarone and underwent DCCV on 07/12/16 with return to sinus. He was cardioverted 11/11/16.   He has been treated with high dose amiodarone with progressive SOB. He has had atrial flutter documented in January and also in March despite ongoing high doses of amiodarone. He was seen by Dr. Rayann Heman in the office on 12/15/16 and planned for AFib ablation given he was feeling so terrible out of rhythm. Amiodarone was discontinued given underlying lung disease and SOB.   He presented to [facility] on 4/16 for TEE in preparation for ablation procedure and found to be hypotensive with SBP in 70's initially.  He reported taking his Midodrine as instructed prior to HD that day.  Dr. Radford Pax felt unable to proceed with TEE given very soft BP with no room for sedation. He was admitted for observation.  He has done well over night and at his baseline, though his BP remains low, is essentially without overt symptoms.  He was evaluated by anesthesia today and deemed to high risk for sedation/anesthesia, Dr. Rayann Heman was in agreement.  He has had discussion this morning with the patient/family, as well as Dr. Justin Mend, nephrology who did not feel like allowing an increased dry weight would be of benefit.  For now, will increased his  midodrine to '10mg'$  daily rather then dialysis days alone, unfortunately, there are no good medicine options for rate control, his HR noted to also at baseline be tachycardic.  Dr. Rayann Heman has reached out to Dr Willis Modena (EP) at [facility] to discuss the case to see if he would be a candidate for high risk AF ablation procedure.  His follow up will be decided pending this discussion.  The patient is being discharged, and will be contacted in regards to follow plans.  He remains in AFlutter with rates 120's-130 range.  Dr. Rayann Heman has seen the patient, and spoke with patient and his family and we all agree that he is approaching a palliative state.  They would like for him to go home today.  He wishes to remain full code but will think about this further.  Dr Justin Mend will also discuss palliative options as an outpatient.   felt stable for discharge, the patient and family are agreeable and he would like to go home.   Hospital Course     Consultants: EP  1. Hypotension:   2. AFlutter w/FVR/fib     No intervention, pending ablation tomorrow     continue his warfarin management as usual with follow up as planned prior to his admission  3. CAD     No c/o CP  4. VHF      w/bioprosthetic AVR  5. Chronic respiratory failure     On continuous home O2, will continue here 4L as reported     Hx of OSA, intolerant of CPAP as well  6. ESRF on HD     Pending length of stay will ask to see   _____________  Discharge Vitals Blood pressure (!) 80/66, pulse (!) 130, temperature 98 F (36.7 C), temperature source Oral, resp. rate 18, height '5\' 10"'$  (1.778 m), weight 168 lb 9.6 oz (76.5 kg), SpO2 99 %.  Filed Weights   12/20/16 1615  Weight: 168 lb 9.6 oz (76.5 kg)    Labs & Radiologic Studies     CBC  Recent Labs  12/20/16 1708  WBC 9.1  HGB 11.9*  HCT 39.1  MCV 107.1*  PLT 938*   Basic Metabolic Panel  Recent Labs  12/21/16 0315  NA 139  K 5.1  CL 96*  CO2 33*  GLUCOSE 109*  BUN 44*    CREATININE 6.39*  CALCIUM 7.6*       Diagnostic Studies/Procedures    None _____________    Disposition   Pt is being discharged home today with family at his baseline condition.  Follow-up Plans & Appointments    Follow-up Information    Thompson Grayer, MD Follow up.   Specialty:  Cardiology Why:  You will be called to arrange follow up. Contact information: [place] [address] [phone]        [facility] Follow up on 12/30/2016.   Specialty:  Cardiology Why:  9:15AM, coumadin clinic Contact information: [address] [place] [phone]  Discharge Instructions    Diet - low sodium heart healthy    Complete by:  As directed    Increase activity slowly    Complete by:  As directed       Discharge Medications     Medication List    TAKE these medications   acetaminophen 500 MG tablet Commonly known as:  TYLENOL Take 1,000 mg by mouth every 6 (six) hours as needed for pain or fever.   atorvastatin 40 MG tablet Commonly known as:  LIPITOR Take 20 mg by mouth at bedtime.   calcium acetate 667 MG capsule Commonly known as:  PHOSLO Take 4,002 mg by mouth 3 (three) times daily with meals.   citalopram 20 MG tablet Commonly known as:  CELEXA Take 20 mg by mouth at bedtime.   clopidogrel 75 MG tablet Commonly known as:  PLAVIX Take 1 tablet (75 mg total) by mouth daily.   docusate sodium 100 MG capsule Commonly known as:  COLACE Take 200 mg by mouth 2 (two) times daily.   folic acid 1 MG tablet Commonly known as:  FOLVITE Take 1 mg by mouth daily.   gabapentin 300 MG capsule Commonly known as:  NEURONTIN Take 300 mg by mouth at bedtime.   guaiFENesin 600 MG 12 hr tablet Commonly known as:  MUCINEX Take 1 tablet (600 mg total) by mouth 2 (two) times daily. What changed:  when to take this   ipratropium-albuterol 0.5-2.5 (3) MG/3ML  Soln Commonly known as:  DUONEB Take 3 mLs by nebulization 2 (two) times daily.   midodrine 10 MG tablet Commonly known as:  PROAMATINE Take 1 tablet (10 mg total) by mouth daily. What changed:  when to take this   multivitamin Tabs tablet Take 1 tablet by mouth daily.   nitroGLYCERIN 0.4 MG SL tablet Commonly known as:  NITROSTAT Place 0.4 mg under the tongue every 5 (five) minutes as needed for chest pain (max 3 doses - if no relief call MD).   OXYGEN Inhale 4 L into the lungs continuous.   oxymetazoline 0.05 % nasal spray Commonly known as:  AFRIN Place 1 spray into both nostrils 2 (two) times daily as needed for congestion.   pantoprazole 40 MG tablet Commonly known as:  PROTONIX Take 1 tablet (40 mg total) by mouth daily.   polyethylene glycol packet Commonly known as:  MIRALAX / GLYCOLAX Take 17 g by mouth at bedtime. Mix in 8 oz liquid and drink   PROAIR HFA 108 (90 Base) MCG/ACT inhaler Generic drug:  albuterol Inhale 1-2 puffs into the lungs every 6 (six) hours as needed for shortness of breath.   ranitidine 150 MG tablet Commonly known as:  ZANTAC Take 150 mg by mouth daily.   sodium polystyrene 15 GM/60ML suspension Commonly known as:  KAYEXALATE Take 15 g by mouth See admin instructions. Take 60 ml (15 g) by mouth in the morning on non-dialysis days - Sunday, Tuesday, Thursday, Saturday   warfarin 7.5 MG tablet Commonly known as:  COUMADIN Take 1/2-1 tablet by mouth daily as directed by coumadin clinic What changed:  See the new instructions. Notes to patient:  Continue your current regime unchanged, keep your scheduled follow up with the coumadin clinic          Outstanding Labs/Studies    Duration of Discharge Encounter   Greater than 30 minutes including physician time.  Signed, Baldwin Jamaica PA-C 12/21/2016, 29:47 PM   Very complicated patient with multiple advanced  medical issues.  He has refractory atrial arrhythmias and rhythm  control is limited by hypotension.  Unfortunately, could not proceed with ablation today due to baseline hypotension.  I have discussed with Dr Justin Mend and we agree that overall prognosis is poor and options limited.  I have also spoken with Dr Willis Modena at [facility] who will assist with arrangements for patient to be seen by [facility] EP for their opinion on this very complicated patient.  Per patient and wife's wishes, he is discharged to home today at his baseline health state.  Thompson Grayer MD, [facility] 12/21/2016 9:19 PM

## 2016-12-21 NOTE — Interval H&P Note (Signed)
History and Physical Interval Note:  12/21/2016 6:00 AM  [name]  has presented today for surgery, with the diagnosis of afib  The various methods of treatment have been discussed with the patient and family. After consideration of risks, benefits and other options for treatment, the patient has consented to atrial flutter and possibly afib ablation as a surgical intervention .  The patient's history has been reviewed, patient examined, no change in status, stable for surgery.  I have reviewed the patient's chart and labs.  Questions were answered to the patient's satisfaction.     Thompson Grayer

## 2016-12-22 DIAGNOSIS — N2581 Secondary hyperparathyroidism of renal origin: Secondary | ICD-10-CM | POA: Diagnosis not present

## 2016-12-22 DIAGNOSIS — N186 End stage renal disease: Secondary | ICD-10-CM | POA: Diagnosis not present

## 2016-12-22 DIAGNOSIS — E875 Hyperkalemia: Secondary | ICD-10-CM | POA: Diagnosis not present

## 2016-12-22 DIAGNOSIS — D509 Iron deficiency anemia, unspecified: Secondary | ICD-10-CM | POA: Diagnosis not present

## 2016-12-23 ENCOUNTER — Ambulatory Visit: Payer: Medicare Other | Admitting: Cardiovascular Disease

## 2016-12-24 DIAGNOSIS — Z880 Allergy status to penicillin: Secondary | ICD-10-CM | POA: Diagnosis not present

## 2016-12-24 DIAGNOSIS — I7090 Unspecified atherosclerosis: Secondary | ICD-10-CM | POA: Diagnosis not present

## 2016-12-24 DIAGNOSIS — I4891 Unspecified atrial fibrillation: Secondary | ICD-10-CM | POA: Diagnosis not present

## 2016-12-24 DIAGNOSIS — E892 Postprocedural hypoparathyroidism: Secondary | ICD-10-CM | POA: Diagnosis not present

## 2016-12-24 DIAGNOSIS — N052 Unspecified nephritic syndrome with diffuse membranous glomerulonephritis: Secondary | ICD-10-CM | POA: Diagnosis not present

## 2016-12-24 DIAGNOSIS — Z940 Kidney transplant status: Secondary | ICD-10-CM | POA: Diagnosis not present

## 2016-12-24 DIAGNOSIS — I480 Paroxysmal atrial fibrillation: Secondary | ICD-10-CM | POA: Diagnosis not present

## 2016-12-24 DIAGNOSIS — I4892 Unspecified atrial flutter: Secondary | ICD-10-CM | POA: Diagnosis not present

## 2016-12-24 DIAGNOSIS — Z951 Presence of aortocoronary bypass graft: Secondary | ICD-10-CM | POA: Diagnosis not present

## 2016-12-24 DIAGNOSIS — Z9109 Other allergy status, other than to drugs and biological substances: Secondary | ICD-10-CM | POA: Diagnosis not present

## 2016-12-24 DIAGNOSIS — Z888 Allergy status to other drugs, medicaments and biological substances status: Secondary | ICD-10-CM | POA: Diagnosis not present

## 2016-12-24 DIAGNOSIS — Z992 Dependence on renal dialysis: Secondary | ICD-10-CM | POA: Diagnosis not present

## 2016-12-24 DIAGNOSIS — Z79899 Other long term (current) drug therapy: Secondary | ICD-10-CM | POA: Diagnosis not present

## 2016-12-24 DIAGNOSIS — G4733 Obstructive sleep apnea (adult) (pediatric): Secondary | ICD-10-CM | POA: Diagnosis not present

## 2016-12-24 DIAGNOSIS — I051 Rheumatic mitral insufficiency: Secondary | ICD-10-CM | POA: Diagnosis not present

## 2016-12-24 DIAGNOSIS — G35 Multiple sclerosis: Secondary | ICD-10-CM | POA: Diagnosis not present

## 2016-12-24 DIAGNOSIS — N186 End stage renal disease: Secondary | ICD-10-CM | POA: Diagnosis not present

## 2016-12-24 DIAGNOSIS — Z8249 Family history of ischemic heart disease and other diseases of the circulatory system: Secondary | ICD-10-CM | POA: Diagnosis not present

## 2016-12-24 DIAGNOSIS — I2510 Atherosclerotic heart disease of native coronary artery without angina pectoris: Secondary | ICD-10-CM | POA: Diagnosis not present

## 2016-12-24 DIAGNOSIS — I4510 Unspecified right bundle-branch block: Secondary | ICD-10-CM | POA: Diagnosis not present

## 2016-12-24 DIAGNOSIS — Z9981 Dependence on supplemental oxygen: Secondary | ICD-10-CM | POA: Diagnosis not present

## 2016-12-24 DIAGNOSIS — N2581 Secondary hyperparathyroidism of renal origin: Secondary | ICD-10-CM | POA: Diagnosis not present

## 2016-12-24 DIAGNOSIS — Z953 Presence of xenogenic heart valve: Secondary | ICD-10-CM | POA: Diagnosis not present

## 2016-12-24 DIAGNOSIS — I252 Old myocardial infarction: Secondary | ICD-10-CM | POA: Diagnosis not present

## 2016-12-24 DIAGNOSIS — I959 Hypotension, unspecified: Secondary | ICD-10-CM | POA: Diagnosis not present

## 2016-12-25 DIAGNOSIS — N2581 Secondary hyperparathyroidism of renal origin: Secondary | ICD-10-CM | POA: Diagnosis not present

## 2016-12-25 DIAGNOSIS — E875 Hyperkalemia: Secondary | ICD-10-CM | POA: Diagnosis not present

## 2016-12-25 DIAGNOSIS — D509 Iron deficiency anemia, unspecified: Secondary | ICD-10-CM | POA: Diagnosis not present

## 2016-12-25 DIAGNOSIS — N186 End stage renal disease: Secondary | ICD-10-CM | POA: Diagnosis not present

## 2016-12-27 DIAGNOSIS — N186 End stage renal disease: Secondary | ICD-10-CM | POA: Diagnosis not present

## 2016-12-27 DIAGNOSIS — D509 Iron deficiency anemia, unspecified: Secondary | ICD-10-CM | POA: Diagnosis not present

## 2016-12-27 DIAGNOSIS — E875 Hyperkalemia: Secondary | ICD-10-CM | POA: Diagnosis not present

## 2016-12-27 DIAGNOSIS — N2581 Secondary hyperparathyroidism of renal origin: Secondary | ICD-10-CM | POA: Diagnosis not present

## 2016-12-29 ENCOUNTER — Telehealth: Payer: Self-pay | Admitting: Internal Medicine

## 2016-12-29 DIAGNOSIS — N186 End stage renal disease: Secondary | ICD-10-CM | POA: Diagnosis not present

## 2016-12-29 DIAGNOSIS — E875 Hyperkalemia: Secondary | ICD-10-CM | POA: Diagnosis not present

## 2016-12-29 DIAGNOSIS — N2581 Secondary hyperparathyroidism of renal origin: Secondary | ICD-10-CM | POA: Diagnosis not present

## 2016-12-29 DIAGNOSIS — D509 Iron deficiency anemia, unspecified: Secondary | ICD-10-CM | POA: Diagnosis not present

## 2016-12-29 NOTE — Telephone Encounter (Signed)
New message    Pam from [facility] is calling to find if pt had a cardiac MRI or CT.

## 2016-12-29 NOTE — Telephone Encounter (Signed)
We have not done either test.  Tried to call back but the number given is a fax number.

## 2016-12-31 DIAGNOSIS — D509 Iron deficiency anemia, unspecified: Secondary | ICD-10-CM | POA: Diagnosis not present

## 2016-12-31 DIAGNOSIS — N2581 Secondary hyperparathyroidism of renal origin: Secondary | ICD-10-CM | POA: Diagnosis not present

## 2016-12-31 DIAGNOSIS — E875 Hyperkalemia: Secondary | ICD-10-CM | POA: Diagnosis not present

## 2016-12-31 DIAGNOSIS — N186 End stage renal disease: Secondary | ICD-10-CM | POA: Diagnosis not present

## 2016-12-31 NOTE — Telephone Encounter (Signed)
Returned call to [facility] and let her know he has not had either test.  She thanked me for my call back.

## 2016-12-31 NOTE — Telephone Encounter (Signed)
Follow up    She needs to speak to you about conflicting information ,please call at your earliest availablility

## 2017-01-03 DIAGNOSIS — Z992 Dependence on renal dialysis: Secondary | ICD-10-CM | POA: Diagnosis not present

## 2017-01-03 DIAGNOSIS — D509 Iron deficiency anemia, unspecified: Secondary | ICD-10-CM | POA: Diagnosis not present

## 2017-01-03 DIAGNOSIS — E875 Hyperkalemia: Secondary | ICD-10-CM | POA: Diagnosis not present

## 2017-01-03 DIAGNOSIS — T8612 Kidney transplant failure: Secondary | ICD-10-CM | POA: Diagnosis not present

## 2017-01-03 DIAGNOSIS — N186 End stage renal disease: Secondary | ICD-10-CM | POA: Diagnosis not present

## 2017-01-03 DIAGNOSIS — N2581 Secondary hyperparathyroidism of renal origin: Secondary | ICD-10-CM | POA: Diagnosis not present

## 2017-01-05 DIAGNOSIS — N186 End stage renal disease: Secondary | ICD-10-CM | POA: Diagnosis not present

## 2017-01-05 DIAGNOSIS — E875 Hyperkalemia: Secondary | ICD-10-CM | POA: Diagnosis not present

## 2017-01-05 DIAGNOSIS — D509 Iron deficiency anemia, unspecified: Secondary | ICD-10-CM | POA: Diagnosis not present

## 2017-01-05 DIAGNOSIS — N2581 Secondary hyperparathyroidism of renal origin: Secondary | ICD-10-CM | POA: Diagnosis not present

## 2017-01-06 ENCOUNTER — Ambulatory Visit (INDEPENDENT_AMBULATORY_CARE_PROVIDER_SITE_OTHER): Payer: Medicare Other

## 2017-01-06 DIAGNOSIS — I214 Non-ST elevation (NSTEMI) myocardial infarction: Secondary | ICD-10-CM

## 2017-01-06 DIAGNOSIS — Z7901 Long term (current) use of anticoagulants: Secondary | ICD-10-CM

## 2017-01-06 DIAGNOSIS — I4891 Unspecified atrial fibrillation: Secondary | ICD-10-CM | POA: Diagnosis not present

## 2017-01-06 DIAGNOSIS — I4892 Unspecified atrial flutter: Secondary | ICD-10-CM

## 2017-01-06 DIAGNOSIS — I483 Typical atrial flutter: Secondary | ICD-10-CM | POA: Diagnosis not present

## 2017-01-06 DIAGNOSIS — Z5181 Encounter for therapeutic drug level monitoring: Secondary | ICD-10-CM | POA: Diagnosis not present

## 2017-01-06 DIAGNOSIS — I2510 Atherosclerotic heart disease of native coronary artery without angina pectoris: Secondary | ICD-10-CM

## 2017-01-06 DIAGNOSIS — Z9861 Coronary angioplasty status: Secondary | ICD-10-CM

## 2017-01-06 LAB — POCT INR: INR: 2.9

## 2017-01-07 DIAGNOSIS — N186 End stage renal disease: Secondary | ICD-10-CM | POA: Diagnosis not present

## 2017-01-07 DIAGNOSIS — N2581 Secondary hyperparathyroidism of renal origin: Secondary | ICD-10-CM | POA: Diagnosis not present

## 2017-01-07 DIAGNOSIS — E875 Hyperkalemia: Secondary | ICD-10-CM | POA: Diagnosis not present

## 2017-01-07 DIAGNOSIS — D509 Iron deficiency anemia, unspecified: Secondary | ICD-10-CM | POA: Diagnosis not present

## 2017-01-10 DIAGNOSIS — D509 Iron deficiency anemia, unspecified: Secondary | ICD-10-CM | POA: Diagnosis not present

## 2017-01-10 DIAGNOSIS — N2581 Secondary hyperparathyroidism of renal origin: Secondary | ICD-10-CM | POA: Diagnosis not present

## 2017-01-10 DIAGNOSIS — E875 Hyperkalemia: Secondary | ICD-10-CM | POA: Diagnosis not present

## 2017-01-10 DIAGNOSIS — N186 End stage renal disease: Secondary | ICD-10-CM | POA: Diagnosis not present

## 2017-01-12 DIAGNOSIS — N2581 Secondary hyperparathyroidism of renal origin: Secondary | ICD-10-CM | POA: Diagnosis not present

## 2017-01-12 DIAGNOSIS — D509 Iron deficiency anemia, unspecified: Secondary | ICD-10-CM | POA: Diagnosis not present

## 2017-01-12 DIAGNOSIS — N186 End stage renal disease: Secondary | ICD-10-CM | POA: Diagnosis not present

## 2017-01-12 DIAGNOSIS — E875 Hyperkalemia: Secondary | ICD-10-CM | POA: Diagnosis not present

## 2017-01-14 ENCOUNTER — Ambulatory Visit (INDEPENDENT_AMBULATORY_CARE_PROVIDER_SITE_OTHER): Payer: Medicare Other | Admitting: Pharmacist

## 2017-01-14 DIAGNOSIS — Z9861 Coronary angioplasty status: Secondary | ICD-10-CM

## 2017-01-14 DIAGNOSIS — I2510 Atherosclerotic heart disease of native coronary artery without angina pectoris: Secondary | ICD-10-CM | POA: Diagnosis not present

## 2017-01-14 DIAGNOSIS — Z7901 Long term (current) use of anticoagulants: Secondary | ICD-10-CM | POA: Diagnosis not present

## 2017-01-14 DIAGNOSIS — I4891 Unspecified atrial fibrillation: Secondary | ICD-10-CM | POA: Diagnosis not present

## 2017-01-14 DIAGNOSIS — N2581 Secondary hyperparathyroidism of renal origin: Secondary | ICD-10-CM | POA: Diagnosis not present

## 2017-01-14 DIAGNOSIS — I483 Typical atrial flutter: Secondary | ICD-10-CM

## 2017-01-14 DIAGNOSIS — I214 Non-ST elevation (NSTEMI) myocardial infarction: Secondary | ICD-10-CM | POA: Diagnosis not present

## 2017-01-14 DIAGNOSIS — I4892 Unspecified atrial flutter: Secondary | ICD-10-CM | POA: Diagnosis not present

## 2017-01-14 DIAGNOSIS — Z5181 Encounter for therapeutic drug level monitoring: Secondary | ICD-10-CM

## 2017-01-14 DIAGNOSIS — N186 End stage renal disease: Secondary | ICD-10-CM | POA: Diagnosis not present

## 2017-01-14 DIAGNOSIS — E875 Hyperkalemia: Secondary | ICD-10-CM | POA: Diagnosis not present

## 2017-01-14 DIAGNOSIS — D509 Iron deficiency anemia, unspecified: Secondary | ICD-10-CM | POA: Diagnosis not present

## 2017-01-14 LAB — POCT INR: INR: 2.4

## 2017-01-17 DIAGNOSIS — N186 End stage renal disease: Secondary | ICD-10-CM | POA: Diagnosis not present

## 2017-01-17 DIAGNOSIS — E875 Hyperkalemia: Secondary | ICD-10-CM | POA: Diagnosis not present

## 2017-01-17 DIAGNOSIS — N2581 Secondary hyperparathyroidism of renal origin: Secondary | ICD-10-CM | POA: Diagnosis not present

## 2017-01-17 DIAGNOSIS — D509 Iron deficiency anemia, unspecified: Secondary | ICD-10-CM | POA: Diagnosis not present

## 2017-01-19 DIAGNOSIS — E875 Hyperkalemia: Secondary | ICD-10-CM | POA: Diagnosis not present

## 2017-01-19 DIAGNOSIS — D509 Iron deficiency anemia, unspecified: Secondary | ICD-10-CM | POA: Diagnosis not present

## 2017-01-19 DIAGNOSIS — N186 End stage renal disease: Secondary | ICD-10-CM | POA: Diagnosis not present

## 2017-01-19 DIAGNOSIS — N2581 Secondary hyperparathyroidism of renal origin: Secondary | ICD-10-CM | POA: Diagnosis not present

## 2017-01-21 ENCOUNTER — Ambulatory Visit (INDEPENDENT_AMBULATORY_CARE_PROVIDER_SITE_OTHER): Payer: Medicare Other | Admitting: *Deleted

## 2017-01-21 DIAGNOSIS — I4892 Unspecified atrial flutter: Secondary | ICD-10-CM

## 2017-01-21 DIAGNOSIS — I214 Non-ST elevation (NSTEMI) myocardial infarction: Secondary | ICD-10-CM | POA: Diagnosis not present

## 2017-01-21 DIAGNOSIS — I483 Typical atrial flutter: Secondary | ICD-10-CM | POA: Diagnosis not present

## 2017-01-21 DIAGNOSIS — Z7901 Long term (current) use of anticoagulants: Secondary | ICD-10-CM | POA: Diagnosis not present

## 2017-01-21 DIAGNOSIS — I2510 Atherosclerotic heart disease of native coronary artery without angina pectoris: Secondary | ICD-10-CM | POA: Diagnosis not present

## 2017-01-21 DIAGNOSIS — Z5181 Encounter for therapeutic drug level monitoring: Secondary | ICD-10-CM | POA: Diagnosis not present

## 2017-01-21 DIAGNOSIS — I4891 Unspecified atrial fibrillation: Secondary | ICD-10-CM | POA: Diagnosis not present

## 2017-01-21 DIAGNOSIS — Z9861 Coronary angioplasty status: Secondary | ICD-10-CM

## 2017-01-21 DIAGNOSIS — N2581 Secondary hyperparathyroidism of renal origin: Secondary | ICD-10-CM | POA: Diagnosis not present

## 2017-01-21 DIAGNOSIS — N186 End stage renal disease: Secondary | ICD-10-CM | POA: Diagnosis not present

## 2017-01-21 DIAGNOSIS — D509 Iron deficiency anemia, unspecified: Secondary | ICD-10-CM | POA: Diagnosis not present

## 2017-01-21 DIAGNOSIS — E875 Hyperkalemia: Secondary | ICD-10-CM | POA: Diagnosis not present

## 2017-01-21 LAB — POCT INR: INR: 1.8

## 2017-01-24 DIAGNOSIS — N186 End stage renal disease: Secondary | ICD-10-CM | POA: Diagnosis not present

## 2017-01-24 DIAGNOSIS — E875 Hyperkalemia: Secondary | ICD-10-CM | POA: Diagnosis not present

## 2017-01-24 DIAGNOSIS — N2581 Secondary hyperparathyroidism of renal origin: Secondary | ICD-10-CM | POA: Diagnosis not present

## 2017-01-24 DIAGNOSIS — D509 Iron deficiency anemia, unspecified: Secondary | ICD-10-CM | POA: Diagnosis not present

## 2017-01-25 ENCOUNTER — Telehealth: Payer: Self-pay | Admitting: Pharmacist

## 2017-01-25 ENCOUNTER — Ambulatory Visit (HOSPITAL_COMMUNITY): Payer: Medicare Other | Admitting: Nurse Practitioner

## 2017-01-25 DIAGNOSIS — Z01818 Encounter for other preprocedural examination: Secondary | ICD-10-CM | POA: Diagnosis not present

## 2017-01-25 DIAGNOSIS — G35 Multiple sclerosis: Secondary | ICD-10-CM | POA: Diagnosis not present

## 2017-01-25 DIAGNOSIS — I4891 Unspecified atrial fibrillation: Secondary | ICD-10-CM | POA: Diagnosis not present

## 2017-01-25 DIAGNOSIS — J439 Emphysema, unspecified: Secondary | ICD-10-CM | POA: Diagnosis not present

## 2017-01-25 DIAGNOSIS — G4733 Obstructive sleep apnea (adult) (pediatric): Secondary | ICD-10-CM | POA: Diagnosis not present

## 2017-01-25 DIAGNOSIS — Z6824 Body mass index (BMI) 24.0-24.9, adult: Secondary | ICD-10-CM | POA: Diagnosis not present

## 2017-01-25 DIAGNOSIS — J449 Chronic obstructive pulmonary disease, unspecified: Secondary | ICD-10-CM | POA: Diagnosis not present

## 2017-01-25 DIAGNOSIS — N186 End stage renal disease: Secondary | ICD-10-CM | POA: Diagnosis not present

## 2017-01-25 DIAGNOSIS — K219 Gastro-esophageal reflux disease without esophagitis: Secondary | ICD-10-CM | POA: Diagnosis not present

## 2017-01-25 NOTE — Telephone Encounter (Signed)
Pt's wife called - INR checked at [facility] today and 2.5. They are aware INR was low at 1.8 last Friday. Pt is set for ablation, moved out next appt for INR check from tomorrow to 1 week post ablation.

## 2017-01-26 ENCOUNTER — Other Ambulatory Visit: Payer: Self-pay | Admitting: Physician Assistant

## 2017-01-26 DIAGNOSIS — N2581 Secondary hyperparathyroidism of renal origin: Secondary | ICD-10-CM | POA: Diagnosis not present

## 2017-01-26 DIAGNOSIS — E875 Hyperkalemia: Secondary | ICD-10-CM | POA: Diagnosis not present

## 2017-01-26 DIAGNOSIS — I517 Cardiomegaly: Secondary | ICD-10-CM | POA: Diagnosis not present

## 2017-01-26 DIAGNOSIS — R918 Other nonspecific abnormal finding of lung field: Secondary | ICD-10-CM | POA: Diagnosis not present

## 2017-01-26 DIAGNOSIS — I4891 Unspecified atrial fibrillation: Secondary | ICD-10-CM | POA: Diagnosis not present

## 2017-01-26 DIAGNOSIS — R590 Localized enlarged lymph nodes: Secondary | ICD-10-CM | POA: Diagnosis not present

## 2017-01-26 DIAGNOSIS — D509 Iron deficiency anemia, unspecified: Secondary | ICD-10-CM | POA: Diagnosis not present

## 2017-01-26 DIAGNOSIS — J9811 Atelectasis: Secondary | ICD-10-CM | POA: Diagnosis not present

## 2017-01-26 DIAGNOSIS — N186 End stage renal disease: Secondary | ICD-10-CM | POA: Diagnosis not present

## 2017-01-28 DIAGNOSIS — N186 End stage renal disease: Secondary | ICD-10-CM | POA: Diagnosis not present

## 2017-01-28 DIAGNOSIS — N2581 Secondary hyperparathyroidism of renal origin: Secondary | ICD-10-CM | POA: Diagnosis not present

## 2017-01-28 DIAGNOSIS — E875 Hyperkalemia: Secondary | ICD-10-CM | POA: Diagnosis not present

## 2017-01-28 DIAGNOSIS — D509 Iron deficiency anemia, unspecified: Secondary | ICD-10-CM | POA: Diagnosis not present

## 2017-01-31 DIAGNOSIS — N186 End stage renal disease: Secondary | ICD-10-CM | POA: Diagnosis not present

## 2017-01-31 DIAGNOSIS — D509 Iron deficiency anemia, unspecified: Secondary | ICD-10-CM | POA: Diagnosis not present

## 2017-01-31 DIAGNOSIS — E875 Hyperkalemia: Secondary | ICD-10-CM | POA: Diagnosis not present

## 2017-01-31 DIAGNOSIS — N2581 Secondary hyperparathyroidism of renal origin: Secondary | ICD-10-CM | POA: Diagnosis not present

## 2017-02-02 DIAGNOSIS — N2581 Secondary hyperparathyroidism of renal origin: Secondary | ICD-10-CM | POA: Diagnosis not present

## 2017-02-02 DIAGNOSIS — D509 Iron deficiency anemia, unspecified: Secondary | ICD-10-CM | POA: Diagnosis not present

## 2017-02-02 DIAGNOSIS — E875 Hyperkalemia: Secondary | ICD-10-CM | POA: Diagnosis not present

## 2017-02-02 DIAGNOSIS — N186 End stage renal disease: Secondary | ICD-10-CM | POA: Diagnosis not present

## 2017-02-03 DIAGNOSIS — N186 End stage renal disease: Secondary | ICD-10-CM | POA: Diagnosis not present

## 2017-02-03 DIAGNOSIS — Z6824 Body mass index (BMI) 24.0-24.9, adult: Secondary | ICD-10-CM | POA: Diagnosis not present

## 2017-02-03 DIAGNOSIS — Z992 Dependence on renal dialysis: Secondary | ICD-10-CM | POA: Diagnosis not present

## 2017-02-03 DIAGNOSIS — R918 Other nonspecific abnormal finding of lung field: Secondary | ICD-10-CM | POA: Diagnosis not present

## 2017-02-03 DIAGNOSIS — T8612 Kidney transplant failure: Secondary | ICD-10-CM | POA: Diagnosis not present

## 2017-02-04 ENCOUNTER — Ambulatory Visit (INDEPENDENT_AMBULATORY_CARE_PROVIDER_SITE_OTHER): Payer: Medicare Other

## 2017-02-04 DIAGNOSIS — I214 Non-ST elevation (NSTEMI) myocardial infarction: Secondary | ICD-10-CM

## 2017-02-04 DIAGNOSIS — Z5181 Encounter for therapeutic drug level monitoring: Secondary | ICD-10-CM

## 2017-02-04 DIAGNOSIS — E875 Hyperkalemia: Secondary | ICD-10-CM | POA: Diagnosis not present

## 2017-02-04 DIAGNOSIS — I4892 Unspecified atrial flutter: Secondary | ICD-10-CM

## 2017-02-04 DIAGNOSIS — Z7901 Long term (current) use of anticoagulants: Secondary | ICD-10-CM | POA: Diagnosis not present

## 2017-02-04 DIAGNOSIS — I2510 Atherosclerotic heart disease of native coronary artery without angina pectoris: Secondary | ICD-10-CM

## 2017-02-04 DIAGNOSIS — I4891 Unspecified atrial fibrillation: Secondary | ICD-10-CM

## 2017-02-04 DIAGNOSIS — Z9861 Coronary angioplasty status: Secondary | ICD-10-CM

## 2017-02-04 DIAGNOSIS — D509 Iron deficiency anemia, unspecified: Secondary | ICD-10-CM | POA: Diagnosis not present

## 2017-02-04 DIAGNOSIS — N186 End stage renal disease: Secondary | ICD-10-CM | POA: Diagnosis not present

## 2017-02-04 DIAGNOSIS — I483 Typical atrial flutter: Secondary | ICD-10-CM

## 2017-02-04 DIAGNOSIS — D631 Anemia in chronic kidney disease: Secondary | ICD-10-CM | POA: Diagnosis not present

## 2017-02-04 DIAGNOSIS — N2581 Secondary hyperparathyroidism of renal origin: Secondary | ICD-10-CM | POA: Diagnosis not present

## 2017-02-04 LAB — POCT INR: INR: 2.2

## 2017-02-07 DIAGNOSIS — D631 Anemia in chronic kidney disease: Secondary | ICD-10-CM | POA: Diagnosis not present

## 2017-02-07 DIAGNOSIS — N186 End stage renal disease: Secondary | ICD-10-CM | POA: Diagnosis not present

## 2017-02-07 DIAGNOSIS — D509 Iron deficiency anemia, unspecified: Secondary | ICD-10-CM | POA: Diagnosis not present

## 2017-02-07 DIAGNOSIS — N2581 Secondary hyperparathyroidism of renal origin: Secondary | ICD-10-CM | POA: Diagnosis not present

## 2017-02-07 DIAGNOSIS — E875 Hyperkalemia: Secondary | ICD-10-CM | POA: Diagnosis not present

## 2017-02-07 NOTE — Addendum Note (Signed)
Addendum  created 02/07/17 1208 by Myrtie Soman, MD   Sign clinical note

## 2017-02-07 NOTE — Anesthesia Postprocedure Evaluation (Signed)
Anesthesia Post Note  Patient: [name]  Procedure(s) Performed: Procedure(s) (LRB): CARDIOVERSION (N/A) TRANSESOPHAGEAL ECHOCARDIOGRAM (TEE) (N/A)     Anesthesia Post Evaluation  Last Vitals:  Vitals:   11/11/16 1210 11/11/16 1215  BP: 101/78 101/75  Pulse: 66 66  Resp: 20 20  Temp:      Last Pain:  Vitals:   11/11/16 1155  TempSrc: Oral                 Stephaie Dardis S

## 2017-02-09 ENCOUNTER — Encounter: Payer: Self-pay | Admitting: Cardiovascular Disease

## 2017-02-09 ENCOUNTER — Ambulatory Visit (INDEPENDENT_AMBULATORY_CARE_PROVIDER_SITE_OTHER): Payer: Medicare Other | Admitting: Cardiovascular Disease

## 2017-02-09 VITALS — BP 100/60 | HR 125 | Ht 70.0 in | Wt 169.4 lb

## 2017-02-09 DIAGNOSIS — D509 Iron deficiency anemia, unspecified: Secondary | ICD-10-CM | POA: Diagnosis not present

## 2017-02-09 DIAGNOSIS — Z9861 Coronary angioplasty status: Secondary | ICD-10-CM

## 2017-02-09 DIAGNOSIS — N186 End stage renal disease: Secondary | ICD-10-CM | POA: Diagnosis not present

## 2017-02-09 DIAGNOSIS — I5032 Chronic diastolic (congestive) heart failure: Secondary | ICD-10-CM

## 2017-02-09 DIAGNOSIS — I480 Paroxysmal atrial fibrillation: Secondary | ICD-10-CM

## 2017-02-09 DIAGNOSIS — I10 Essential (primary) hypertension: Secondary | ICD-10-CM

## 2017-02-09 DIAGNOSIS — I2510 Atherosclerotic heart disease of native coronary artery without angina pectoris: Secondary | ICD-10-CM

## 2017-02-09 DIAGNOSIS — E875 Hyperkalemia: Secondary | ICD-10-CM | POA: Diagnosis not present

## 2017-02-09 DIAGNOSIS — D631 Anemia in chronic kidney disease: Secondary | ICD-10-CM | POA: Diagnosis not present

## 2017-02-09 DIAGNOSIS — I4892 Unspecified atrial flutter: Secondary | ICD-10-CM | POA: Diagnosis not present

## 2017-02-09 DIAGNOSIS — N2581 Secondary hyperparathyroidism of renal origin: Secondary | ICD-10-CM | POA: Diagnosis not present

## 2017-02-09 MED ORDER — PANTOPRAZOLE SODIUM 40 MG PO TBEC: 40.0000 mg | DELAYED_RELEASE_TABLET | Freq: Two times a day (BID) | ORAL | 3 refills | Status: AC

## 2017-02-09 MED ORDER — MIDODRINE HCL 10 MG PO TABS: 10.0000 mg | ORAL_TABLET | Freq: Every day | ORAL | 3 refills | Status: DC

## 2017-02-09 NOTE — Patient Instructions (Signed)
Medication Instructions:  Your physician has recommended you make the following change in your medication:  Stop Zantac. Increase protonix 40 mg by mouth twice daily.    Labwork: none  Testing/Procedures: none  Follow-Up: Your physician recommends that you schedule a follow-up appointment in: 6 months. Please call our office in about 3 months to schedule this appointment.    Any Other Special Instructions Will Be Listed Below (If Applicable).     If you need a refill on your cardiac medications before your next appointment, please call your pharmacy.

## 2017-02-09 NOTE — Progress Notes (Signed)
Chief Complaint  Patient presents with  . Follow-up    CAD     History of Present Illness: 61 yo male with history of CAD s/p 2V CABG in 2014 and subsequent PCI procedures, AVR in 2014 with bioprosthetic AVR, ESRD on HD, persistent atrial flutter, multiple sclerosis and sleep apnea who is here today for cardiac follow up. I met him in 2014 after a cardiac arrest during VATS procedure. He was found to have moderate multi-vessel CAD but had worsened symptoms on medical therapy and had 2 V CABG in 2014 along with AVR at [facility]. A bioprosthetic AVR was placed. He had a  NSTEMI in May 2016 at [facility] and had a Resolute DES placed in the Circumflex and a bare metal stent placed in the SVG to LAD. He was admitted to [facility] with an anterior STEMI in June 2017 secondary to acute occlusion of the SVG to LAD treated with a DES. His Plavix had been stopped prior to planned renal transplant. Admitted to [facility] July 2017 with volume overload. Echo July 2017 with normal LVEF, moderate MR, normally functioning bioprosthetic AVR. Most recent assessment of LVEF by TEE in 45-50%. 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 He has atrial arrhythmias reported for several years, dating back to 2016. He was noted to have SVT and atrial fllutter during admission in 2016. Admitted to [facility] November 2017 and found to be in atrial fib//flutter. He was started on amiodarone and underwent cardioversion with return to sinus. He has been followed in the atrial fib clinic since then. He has continued to have issues with recurrent atrial fib/flutter. Repeat cardioversion March 2018. He had recurrent atrial fib/flutter and ablation was planned by Dr. Rayann Heman but anesthesia did not feel comfortable sedating the patient. He was referred to [facility] EP clinic and ablation is being planned. He has unfortunately been found to have a lung mass and this is worrisome for cancer. Plans for ablation have been put on hold. He has had  documented heart rates of 125 bpm since April with ongoing atrial flutter.  He has severe MR by mot recent TEE in April 2018.   He is here today for follow up. He has weakness at times but overall does not feel bad. He has no chest pain, dyspnea, lower extremity edema, orthopnea, PND, dizziness, near syncope or syncope. He has had several episodes of heartburn after meals.   Primary Care Physician: Algis Greenhouse, MD   Past Medical History:  Diagnosis Date  . Acute blood loss anemia    a. 02/2016 due to groin hematoma. Rec 3 U PRBC.  Marland Kitchen Anemia   . Aortic heart valve prolapse 04/2013   a. s/p bioprosthetic AVR at time of CABG.  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 Bifascicular block   . CAD (coronary artery disease) 04/2013   a. 04/2013: s/p CABG x 2 (Y SVG -LAD & D2). b. 01/2015: NSTEMI s/p DES to LCx, BMS to SVG-LAD. c. NSTEMI 05/2015 during AF/AFL - non-flow limiting FFR. d. Low risk nuc 3/17. e. STEMI 6/17 after coming off Plavix, s/p DES to SVG-LAD into native vessel.  . Carotid artery disease ([place])    a. 1-39% stenosis bilaterally in 06/2015.   Marland Kitchen Chronic respiratory failure ([place])   . COPD (chronic obstructive pulmonary disease) ([place])   . Dyspnea   . 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  b. He had a renal Tx from 2008 to 2011, but subsequent rejection - Gets HD in [place], [place] on MWF schedule.    . Hematoma    a. Large right groin hematoma after cath 02/2016 with associated ABL anemia.  . Hypertension   . Multiple sclerosis ([place])   . Nocturnal hypoxemia   . On home oxygen therapy    pt states he has not been wearing it  . PAF (paroxysmal atrial fibrillation) ([place])    a. h/o, placed on amiodarone 07/2016 due to recurrence.  . Paroxysmal atrial flutter ([place])    a. During 05/2015 admission - SVT, atrial flutter, and PAF.  Marland Kitchen Peripheral vascular disease ([place])    Cath in 01/2015 required 45 cm Destination Sheath  . Renal  transplant failure and rejection   . 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 intolerant of bipap.  Marland Kitchen SVT (supraventricular tachycardia) ([place])   . Valvular heart disease    a. 2D Echo 03/25/16: mild LVH, EF 50-55%, grade 2 Dd, AVR present with mild AI, mod MR, severely dilated LA, mildly dilated RV, mod RAE, PASP 72.    Past Surgical History:  Procedure Laterality Date  . AV FISTULA PLACEMENT    . CARDIAC CATHETERIZATION N/A 05/26/2015   Procedure: Left Heart Cath and Coronary Angiography;  Surgeon: Leonie Man, MD;  Location: [place] CV LAB;  Service: Cardiovascular;  Laterality: N/A;  . CARDIAC CATHETERIZATION N/A 05/26/2015   Procedure: Intravascular Pressure Wire/FFR Study;  Surgeon: Leonie Man, MD;  Location: [place] CV LAB;  Service: Cardiovascular;  Laterality: N/A;  . CARDIAC CATHETERIZATION N/A 02/15/2016   Procedure: Left Heart Cath and Coronary Angiography;  Surgeon: Wellington Hampshire, MD;  Location: [place] CV LAB;  Service: Cardiovascular;  Laterality: N/A;  . CARDIOVERSION N/A 07/12/2016   Procedure: CARDIOVERSION;  Surgeon: Minus Breeding, MD;  Location: [facility] ENDOSCOPY;  Service: Cardiovascular;  Laterality: N/A;  . CARDIOVERSION N/A 11/11/2016   Procedure: CARDIOVERSION;  Surgeon: Fay Records, MD;  Location: [facility] ENDOSCOPY;  Service: Cardiovascular;  Laterality: N/A;  . CARDIOVERSION N/A 12/09/2016   Procedure: CARDIOVERSION;  Surgeon: Sanda Klein, MD;  Location: [facility] ENDOSCOPY;  Service: Cardiovascular;  Laterality: N/A;  . CORONARY ARTERY BYPASS GRAFT    . excised squamous cells at rectum  2006  . flash     flash pulmonary edema  . HERNIA REPAIR  07/2011  . KIDNEY TRANSPLANT  08/2007  . KNEE SURGERY    . LEFT HEART CATHETERIZATION WITH CORONARY ANGIOGRAM N/A 01/09/2013   Procedure: LEFT HEART CATHETERIZATION WITH CORONARY ANGIOGRAM;  Surgeon: Burnell Blanks, MD;  Location: [facility] CATH LAB;  Service: Cardiovascular;   Laterality: N/A;  . TEE WITHOUT CARDIOVERSION N/A 11/11/2016   Procedure: TRANSESOPHAGEAL ECHOCARDIOGRAM (TEE);  Surgeon: Fay Records, MD;  Location: [place];  Service: Cardiovascular;  Laterality: N/A;  . TEE WITHOUT CARDIOVERSION N/A 12/09/2016   Procedure: TRANSESOPHAGEAL ECHOCARDIOGRAM (TEE);  Surgeon: Sanda Klein, MD;  Location: [facility] ENDOSCOPY;  Service: Cardiovascular;  Laterality: N/A;    Current Outpatient Prescriptions  Medication Sig Dispense Refill  . acetaminophen (TYLENOL) 500 MG tablet Take 1,000 mg by mouth every 6 (six) hours as needed for pain or fever.    Marland Kitchen albuterol (PROAIR HFA) 108 (90 Base) MCG/ACT inhaler Inhale 1-2 puffs into the lungs every 6 (six) hours as needed for shortness of breath.     Marland Kitchen  atorvastatin (LIPITOR) 40 MG tablet Take 20 mg by mouth at bedtime.     . calcium acetate (PHOSLO) 667 MG capsule Take 4,002 mg by mouth 3 (three) times daily with meals.     . citalopram (CELEXA) 20 MG tablet Take 20 mg by mouth at bedtime.     . clopidogrel (PLAVIX) 75 MG tablet TAKE 1 TABLET BY MOUTH ONCE (1) DAILY 90 tablet 3  . docusate sodium (COLACE) 100 MG capsule Take 200 mg by mouth 2 (two) times daily.     . folic acid (FOLVITE) 1 MG tablet Take 1 mg by mouth daily.     Marland Kitchen gabapentin (NEURONTIN) 300 MG capsule Take 300 mg by mouth at bedtime.     Marland Kitchen guaiFENesin (MUCINEX) 600 MG 12 hr tablet Take 1 tablet (600 mg total) by mouth 2 (two) times daily. (Patient taking differently: Take 600 mg by mouth at bedtime. ) 14 tablet 0  . ipratropium-albuterol (DUONEB) 0.5-2.5 (3) MG/3ML SOLN Take 3 mLs by nebulization 2 (two) times daily.     . midodrine (PROAMATINE) 10 MG tablet Take 1 tablet (10 mg total) by mouth daily. 90 tablet 3  . multivitamin (RENA-VIT) TABS tablet Take 1 tablet by mouth daily. 30 tablet 1  . nitroGLYCERIN (NITROSTAT) 0.4 MG SL tablet Place 0.4 mg under the tongue every 5 (five) minutes as needed for chest pain (max 3 doses - if no relief call MD).     .  OXYGEN Inhale 4 L into the lungs continuous.    Marland Kitchen oxymetazoline (AFRIN) 0.05 % nasal spray Place 1 spray into both nostrils 2 (two) times daily as needed for congestion.    . polyethylene glycol (MIRALAX / GLYCOLAX) packet Take 17 g by mouth at bedtime. Mix in 8 oz liquid and drink    . sodium polystyrene (KAYEXALATE) 15 GM/60ML suspension Take 15 g by mouth See admin instructions. Take 60 ml (15 g) by mouth in the morning on non-dialysis days - Sunday, Tuesday, Thursday, Saturday    . warfarin (COUMADIN) 7.5 MG tablet Take 1/2-1 tablet by mouth daily as directed by coumadin clinic (Patient taking differently: take 1/2 tablet by mouth daily at bedtime or as directed by coumadin clinic) 30 tablet 1  . pantoprazole (PROTONIX) 40 MG tablet Take 1 tablet (40 mg total) by mouth 2 (two) times daily. 180 tablet 3   No current facility-administered medications for this visit.     Allergies  Allergen Reactions  . Diphenhydramine Itching and Anxiety    Only with IV doses. Tolerates oral.  . Penicillins Other (See Comments)    Migraine Has patient had a PCN reaction causing immediate rash, facial/tongue/throat swelling, SOB or lightheadedness with hypotension: no Has patient had a PCN reaction causing severe rash involving mucus membranes or skin necrosis: No Has patient had a PCN reaction that required hospitalization No Has patient had a PCN reaction occurring within the last 10 years: No If all of the above answers are "NO", then may proceed with Cephalosporin use.  . Bupropion Other (See Comments)    Caused anxiety  . Adhesive [Tape] Rash    Please use paper tape  . Amoxicillin Rash    migraine  . Ativan [Lorazepam] Anxiety    Social History   Social History  . Marital status: Married    Spouse name: N/A  . Number of children: N/A  . Years of education: N/A   Occupational History  . Disabled    Social History Main  Topics  . Smoking status: Former Smoker    Packs/day: 2.00     Years: 20.00    Types: Cigarettes    Quit date: 08/06/1998  . Smokeless tobacco: Never Used  . Alcohol use No  . Drug use: No  . Sexual activity: Not on file   Other Topics Concern  . Not on file   Social History Narrative   No history of premature CAD in either parents or siblings. However, his maternal grandfather and 2 maternal uncles had coronary artery disease.    Family History  Problem Relation Age of Onset  . Heart failure Mother        started in 37s  . Emphysema Mother        smoked  . Diabetes Father   . Lupus Sister   . Heart attack Maternal Grandfather   . Heart attack Maternal Uncle   . Heart attack Maternal Aunt   . Hypertension Maternal Aunt   . Stroke Neg Hx     Review of Systems:  As stated in the HPI and otherwise negative.   BP 100/60   Pulse (!) 125   Ht 5' 10"  (1.778 m)   Wt 169 lb 6.4 oz (76.8 kg)   BMI 24.31 kg/m   Physical Examination:  General: Well developed, well nourished, NAD  HEENT: OP clear, mucus membranes moist  SKIN: warm, dry. No rashes. Neuro: No focal deficits  Musculoskeletal: Muscle strength 5/5 all ext  Psychiatric: Mood and affect normal  Neck: No JVD, no carotid bruits, no thyromegaly, no lymphadenopathy.  Lungs:Clear bilaterally, no wheezes, rhonci, crackles Cardiovascular: Irreg irreg. Tachy Systolic murmur noted. No gallops or rubs. Abdomen:Soft. Bowel sounds present. Non-tender.  Extremities: No lower extremity edema. Pulses are 2 + in the bilateral DP/PT.  TEE April 2018: Left ventricle: The cavity size was normal. Wall thickness was   increased in a pattern of moderate LVH. There was concentric   hypertrophy. Systolic function was mildly reduced. The estimated   ejection fraction was in the range of 45% to 50%. No evidence of   thrombus. - Aortic valve: A bioprosthesis was present and functioning   normally. There was mild regurgitation. There was no significant   perivalvular regurgitation. - Mitral valve:  Severely calcified annulus. Mildly thickened   leaflets . The findings are consistent with trivial stenosis.   There was severe regurgitation directed eccentrically and   posteriorly. Severe regurgitation is suggested by pulmonary vein   systolic flow reversal. - Left atrium: The atrium was mildly to moderately dilated. No   evidence of thrombus in the atrial cavity or appendage. No   spontaneous echo contrast was observed. - Right ventricle: The cavity size was mildly dilated. - Right atrium: The atrium was mildly dilated. - Atrial septum: No defect or patent foramen ovale was identified. - Tricuspid valve: No evidence of vegetation. There was   regurgitation directed eccentrically and toward the free wall.   There was moderate-severe perivalvular regurgitation.   EKG:  EKG is ordered today. The ekg ordered today demonstrates Atrial flutter, rate 125 bpm. RBBB  Recent Labs: 11/16/2016: TSH 3.957 12/05/2016: B Natriuretic Peptide 1,624.4 12/06/2016: ALT 38 12/08/2016: Magnesium 2.4 12/20/2016: Hemoglobin 11.9; Platelets 124 12/21/2016: BUN 44; Creatinine, Ser 6.39; Potassium 5.1; Sodium 139   Lipid Panel    Component Value Date/Time   CHOL 98 (L) 03/05/2016 1125   TRIG 143 03/05/2016 1125   HDL 32 (L) 03/05/2016 1125   CHOLHDL 3.1 03/05/2016 1125  VLDL 29 03/05/2016 1125   LDLCALC 37 03/05/2016 1125     Wt Readings from Last 3 Encounters:  02/09/17 169 lb 6.4 oz (76.8 kg)  12/20/16 168 lb 9.6 oz (76.5 kg)  12/15/16 173 lb 9.6 oz (78.7 kg)     Other studies Reviewed: Additional studies/ records that were reviewed today include: . Review of the above records demonstrates:    Assessment and Plan:   1. Atrial fibrillation/flutter: He is in atrial flutter today with RVR but this is unchanged from his rates when seen by our EP team and the EP team at [facility]. He is off of all rate control agents due to hypotension. He is off of amiodarone. Further plans by EP.   2. CAD without  angina: He has rare heart burn. This does not sound cardiac. He is s/p CABG in 2014 and several PCI/stent procedures since then, most recently acute anterior MI in June 2017 secondary to thrombotic occlusion of  Anastomosis of the SVG to LAD, treated with a DES. His MI occurred after stopping Plavix for planning renal transplant. Continue Plavix and statin. No ASA due to use of coumadin and Plavix.    3. Aortic valve disease: He is s/p bioprosthetic AVR in 2014 at [facility]. Mild AI by TEE April 2018. He will continue to use antibiotic prophylaxis before dental visits.   4. Mitral regurgiation: Severe by TEE April 2018. He is not a candidate for invasive procedures.    5. HTN: BP is low but stable.   6. ESRD: He is on HD  7. Carotid artery disease: Mild disease by dopplers 2016.  8. Chronic diastolic CHF: Weight stable. No LE edema. Volume management by HD.   9. GERD: Will double Protonix to 40 mg po BID  Extensive review of records today from multiple hospitalizations over the last 6 months.    Current medicines are reviewed at length with the patient today.  The patient does not have concerns regarding medicines.  The following changes have been made:  no change  Labs/ tests ordered today include:   Orders Placed This Encounter  Procedures  . EKG 12-Lead    Disposition:   FU with me in 6  months. He will continue workup at [facility] for lung mass and possible atrial flutter ablation.   Signed, Lauree Chandler, MD 02/10/2017 8:50 AM    [facility] [address], [place], [place]  [address] Phone: [phone]; Fax: [phone]

## 2017-02-11 DIAGNOSIS — N2581 Secondary hyperparathyroidism of renal origin: Secondary | ICD-10-CM | POA: Diagnosis not present

## 2017-02-11 DIAGNOSIS — D509 Iron deficiency anemia, unspecified: Secondary | ICD-10-CM | POA: Diagnosis not present

## 2017-02-11 DIAGNOSIS — E875 Hyperkalemia: Secondary | ICD-10-CM | POA: Diagnosis not present

## 2017-02-11 DIAGNOSIS — D631 Anemia in chronic kidney disease: Secondary | ICD-10-CM | POA: Diagnosis not present

## 2017-02-11 DIAGNOSIS — N186 End stage renal disease: Secondary | ICD-10-CM | POA: Diagnosis not present

## 2017-02-14 DIAGNOSIS — E875 Hyperkalemia: Secondary | ICD-10-CM | POA: Diagnosis not present

## 2017-02-14 DIAGNOSIS — D509 Iron deficiency anemia, unspecified: Secondary | ICD-10-CM | POA: Diagnosis not present

## 2017-02-14 DIAGNOSIS — N2581 Secondary hyperparathyroidism of renal origin: Secondary | ICD-10-CM | POA: Diagnosis not present

## 2017-02-14 DIAGNOSIS — N186 End stage renal disease: Secondary | ICD-10-CM | POA: Diagnosis not present

## 2017-02-14 DIAGNOSIS — D631 Anemia in chronic kidney disease: Secondary | ICD-10-CM | POA: Diagnosis not present

## 2017-02-16 ENCOUNTER — Other Ambulatory Visit: Payer: Self-pay | Admitting: Cardiology

## 2017-02-16 DIAGNOSIS — E875 Hyperkalemia: Secondary | ICD-10-CM | POA: Diagnosis not present

## 2017-02-16 DIAGNOSIS — N186 End stage renal disease: Secondary | ICD-10-CM | POA: Diagnosis not present

## 2017-02-16 DIAGNOSIS — N2581 Secondary hyperparathyroidism of renal origin: Secondary | ICD-10-CM | POA: Diagnosis not present

## 2017-02-16 DIAGNOSIS — D509 Iron deficiency anemia, unspecified: Secondary | ICD-10-CM | POA: Diagnosis not present

## 2017-02-16 DIAGNOSIS — D631 Anemia in chronic kidney disease: Secondary | ICD-10-CM | POA: Diagnosis not present

## 2017-02-17 DIAGNOSIS — R918 Other nonspecific abnormal finding of lung field: Secondary | ICD-10-CM | POA: Diagnosis not present

## 2017-02-18 ENCOUNTER — Ambulatory Visit (INDEPENDENT_AMBULATORY_CARE_PROVIDER_SITE_OTHER): Payer: Medicare Other | Admitting: *Deleted

## 2017-02-18 DIAGNOSIS — I483 Typical atrial flutter: Secondary | ICD-10-CM

## 2017-02-18 DIAGNOSIS — Z9861 Coronary angioplasty status: Secondary | ICD-10-CM | POA: Diagnosis not present

## 2017-02-18 DIAGNOSIS — I4891 Unspecified atrial fibrillation: Secondary | ICD-10-CM

## 2017-02-18 DIAGNOSIS — Z5181 Encounter for therapeutic drug level monitoring: Secondary | ICD-10-CM

## 2017-02-18 DIAGNOSIS — Z7901 Long term (current) use of anticoagulants: Secondary | ICD-10-CM | POA: Diagnosis not present

## 2017-02-18 DIAGNOSIS — N186 End stage renal disease: Secondary | ICD-10-CM | POA: Diagnosis not present

## 2017-02-18 DIAGNOSIS — I2510 Atherosclerotic heart disease of native coronary artery without angina pectoris: Secondary | ICD-10-CM

## 2017-02-18 DIAGNOSIS — I4892 Unspecified atrial flutter: Secondary | ICD-10-CM

## 2017-02-18 DIAGNOSIS — D631 Anemia in chronic kidney disease: Secondary | ICD-10-CM | POA: Diagnosis not present

## 2017-02-18 DIAGNOSIS — N2581 Secondary hyperparathyroidism of renal origin: Secondary | ICD-10-CM | POA: Diagnosis not present

## 2017-02-18 DIAGNOSIS — D509 Iron deficiency anemia, unspecified: Secondary | ICD-10-CM | POA: Diagnosis not present

## 2017-02-18 DIAGNOSIS — I214 Non-ST elevation (NSTEMI) myocardial infarction: Secondary | ICD-10-CM

## 2017-02-18 DIAGNOSIS — E875 Hyperkalemia: Secondary | ICD-10-CM | POA: Diagnosis not present

## 2017-02-18 LAB — POCT INR: INR: 2.9

## 2017-02-21 DIAGNOSIS — N2581 Secondary hyperparathyroidism of renal origin: Secondary | ICD-10-CM | POA: Diagnosis not present

## 2017-02-21 DIAGNOSIS — D631 Anemia in chronic kidney disease: Secondary | ICD-10-CM | POA: Diagnosis not present

## 2017-02-21 DIAGNOSIS — N186 End stage renal disease: Secondary | ICD-10-CM | POA: Diagnosis not present

## 2017-02-21 DIAGNOSIS — D509 Iron deficiency anemia, unspecified: Secondary | ICD-10-CM | POA: Diagnosis not present

## 2017-02-21 DIAGNOSIS — E875 Hyperkalemia: Secondary | ICD-10-CM | POA: Diagnosis not present

## 2017-02-23 DIAGNOSIS — N186 End stage renal disease: Secondary | ICD-10-CM | POA: Diagnosis not present

## 2017-02-23 DIAGNOSIS — D509 Iron deficiency anemia, unspecified: Secondary | ICD-10-CM | POA: Diagnosis not present

## 2017-02-23 DIAGNOSIS — E875 Hyperkalemia: Secondary | ICD-10-CM | POA: Diagnosis not present

## 2017-02-23 DIAGNOSIS — N2581 Secondary hyperparathyroidism of renal origin: Secondary | ICD-10-CM | POA: Diagnosis not present

## 2017-02-23 DIAGNOSIS — D631 Anemia in chronic kidney disease: Secondary | ICD-10-CM | POA: Diagnosis not present

## 2017-02-24 ENCOUNTER — Ambulatory Visit (HOSPITAL_COMMUNITY): Payer: Medicare Other | Admitting: Nurse Practitioner

## 2017-02-24 DIAGNOSIS — IMO0001 Reserved for inherently not codable concepts without codable children: Secondary | ICD-10-CM | POA: Insufficient documentation

## 2017-02-24 DIAGNOSIS — R911 Solitary pulmonary nodule: Secondary | ICD-10-CM | POA: Insufficient documentation

## 2017-02-25 DIAGNOSIS — D509 Iron deficiency anemia, unspecified: Secondary | ICD-10-CM | POA: Diagnosis not present

## 2017-02-25 DIAGNOSIS — D631 Anemia in chronic kidney disease: Secondary | ICD-10-CM | POA: Diagnosis not present

## 2017-02-25 DIAGNOSIS — N186 End stage renal disease: Secondary | ICD-10-CM | POA: Diagnosis not present

## 2017-02-25 DIAGNOSIS — E875 Hyperkalemia: Secondary | ICD-10-CM | POA: Diagnosis not present

## 2017-02-25 DIAGNOSIS — N2581 Secondary hyperparathyroidism of renal origin: Secondary | ICD-10-CM | POA: Diagnosis not present

## 2017-02-28 DIAGNOSIS — D631 Anemia in chronic kidney disease: Secondary | ICD-10-CM | POA: Diagnosis not present

## 2017-02-28 DIAGNOSIS — N2581 Secondary hyperparathyroidism of renal origin: Secondary | ICD-10-CM | POA: Diagnosis not present

## 2017-02-28 DIAGNOSIS — N186 End stage renal disease: Secondary | ICD-10-CM | POA: Diagnosis not present

## 2017-02-28 DIAGNOSIS — E875 Hyperkalemia: Secondary | ICD-10-CM | POA: Diagnosis not present

## 2017-02-28 DIAGNOSIS — D509 Iron deficiency anemia, unspecified: Secondary | ICD-10-CM | POA: Diagnosis not present

## 2017-03-02 DIAGNOSIS — N186 End stage renal disease: Secondary | ICD-10-CM | POA: Diagnosis not present

## 2017-03-02 DIAGNOSIS — D631 Anemia in chronic kidney disease: Secondary | ICD-10-CM | POA: Diagnosis not present

## 2017-03-02 DIAGNOSIS — E875 Hyperkalemia: Secondary | ICD-10-CM | POA: Diagnosis not present

## 2017-03-02 DIAGNOSIS — N2581 Secondary hyperparathyroidism of renal origin: Secondary | ICD-10-CM | POA: Diagnosis not present

## 2017-03-02 DIAGNOSIS — D509 Iron deficiency anemia, unspecified: Secondary | ICD-10-CM | POA: Diagnosis not present

## 2017-03-04 DIAGNOSIS — J90 Pleural effusion, not elsewhere classified: Secondary | ICD-10-CM | POA: Diagnosis not present

## 2017-03-04 DIAGNOSIS — R918 Other nonspecific abnormal finding of lung field: Secondary | ICD-10-CM | POA: Diagnosis not present

## 2017-03-04 DIAGNOSIS — N2581 Secondary hyperparathyroidism of renal origin: Secondary | ICD-10-CM | POA: Diagnosis not present

## 2017-03-04 DIAGNOSIS — K029 Dental caries, unspecified: Secondary | ICD-10-CM | POA: Diagnosis not present

## 2017-03-04 DIAGNOSIS — J441 Chronic obstructive pulmonary disease with (acute) exacerbation: Secondary | ICD-10-CM | POA: Diagnosis not present

## 2017-03-04 DIAGNOSIS — D509 Iron deficiency anemia, unspecified: Secondary | ICD-10-CM | POA: Diagnosis not present

## 2017-03-04 DIAGNOSIS — I2510 Atherosclerotic heart disease of native coronary artery without angina pectoris: Secondary | ICD-10-CM | POA: Diagnosis not present

## 2017-03-04 DIAGNOSIS — I484 Atypical atrial flutter: Secondary | ICD-10-CM | POA: Diagnosis not present

## 2017-03-04 DIAGNOSIS — R079 Chest pain, unspecified: Secondary | ICD-10-CM | POA: Diagnosis not present

## 2017-03-04 DIAGNOSIS — R911 Solitary pulmonary nodule: Secondary | ICD-10-CM | POA: Diagnosis not present

## 2017-03-04 DIAGNOSIS — K047 Periapical abscess without sinus: Secondary | ICD-10-CM | POA: Diagnosis not present

## 2017-03-04 DIAGNOSIS — I959 Hypotension, unspecified: Secondary | ICD-10-CM | POA: Diagnosis not present

## 2017-03-04 DIAGNOSIS — J449 Chronic obstructive pulmonary disease, unspecified: Secondary | ICD-10-CM | POA: Diagnosis not present

## 2017-03-04 DIAGNOSIS — J9621 Acute and chronic respiratory failure with hypoxia: Secondary | ICD-10-CM | POA: Diagnosis not present

## 2017-03-04 DIAGNOSIS — T8612 Kidney transplant failure: Secondary | ICD-10-CM | POA: Diagnosis not present

## 2017-03-04 DIAGNOSIS — R9431 Abnormal electrocardiogram [ECG] [EKG]: Secondary | ICD-10-CM | POA: Diagnosis not present

## 2017-03-04 DIAGNOSIS — R000 Tachycardia, unspecified: Secondary | ICD-10-CM | POA: Diagnosis not present

## 2017-03-04 DIAGNOSIS — Z6824 Body mass index (BMI) 24.0-24.9, adult: Secondary | ICD-10-CM | POA: Diagnosis not present

## 2017-03-04 DIAGNOSIS — K219 Gastro-esophageal reflux disease without esophagitis: Secondary | ICD-10-CM | POA: Diagnosis not present

## 2017-03-04 DIAGNOSIS — N186 End stage renal disease: Secondary | ICD-10-CM | POA: Diagnosis not present

## 2017-03-04 DIAGNOSIS — R590 Localized enlarged lymph nodes: Secondary | ICD-10-CM | POA: Diagnosis not present

## 2017-03-04 DIAGNOSIS — G35 Multiple sclerosis: Secondary | ICD-10-CM | POA: Diagnosis not present

## 2017-03-04 DIAGNOSIS — D631 Anemia in chronic kidney disease: Secondary | ICD-10-CM | POA: Diagnosis not present

## 2017-03-04 DIAGNOSIS — E875 Hyperkalemia: Secondary | ICD-10-CM | POA: Diagnosis not present

## 2017-03-04 DIAGNOSIS — Z992 Dependence on renal dialysis: Secondary | ICD-10-CM | POA: Diagnosis not present

## 2017-03-04 DIAGNOSIS — Z953 Presence of xenogenic heart valve: Secondary | ICD-10-CM | POA: Diagnosis not present

## 2017-03-04 DIAGNOSIS — J9622 Acute and chronic respiratory failure with hypercapnia: Secondary | ICD-10-CM | POA: Diagnosis not present

## 2017-03-04 DIAGNOSIS — I4892 Unspecified atrial flutter: Secondary | ICD-10-CM | POA: Diagnosis not present

## 2017-03-04 DIAGNOSIS — I517 Cardiomegaly: Secondary | ICD-10-CM | POA: Diagnosis not present

## 2017-03-05 DIAGNOSIS — J449 Chronic obstructive pulmonary disease, unspecified: Secondary | ICD-10-CM | POA: Diagnosis not present

## 2017-03-05 DIAGNOSIS — E875 Hyperkalemia: Secondary | ICD-10-CM | POA: Diagnosis not present

## 2017-03-05 DIAGNOSIS — K30 Functional dyspepsia: Secondary | ICD-10-CM | POA: Diagnosis not present

## 2017-03-05 DIAGNOSIS — Z953 Presence of xenogenic heart valve: Secondary | ICD-10-CM | POA: Diagnosis not present

## 2017-03-05 DIAGNOSIS — N2581 Secondary hyperparathyroidism of renal origin: Secondary | ICD-10-CM | POA: Diagnosis present

## 2017-03-05 DIAGNOSIS — J90 Pleural effusion, not elsewhere classified: Secondary | ICD-10-CM | POA: Diagnosis not present

## 2017-03-05 DIAGNOSIS — R000 Tachycardia, unspecified: Secondary | ICD-10-CM | POA: Diagnosis not present

## 2017-03-05 DIAGNOSIS — M859 Disorder of bone density and structure, unspecified: Secondary | ICD-10-CM | POA: Diagnosis present

## 2017-03-05 DIAGNOSIS — J9602 Acute respiratory failure with hypercapnia: Secondary | ICD-10-CM | POA: Diagnosis not present

## 2017-03-05 DIAGNOSIS — Z7901 Long term (current) use of anticoagulants: Secondary | ICD-10-CM | POA: Diagnosis not present

## 2017-03-05 DIAGNOSIS — I4891 Unspecified atrial fibrillation: Secondary | ICD-10-CM | POA: Diagnosis not present

## 2017-03-05 DIAGNOSIS — I4439 Other atrioventricular block: Secondary | ICD-10-CM | POA: Diagnosis not present

## 2017-03-05 DIAGNOSIS — J9691 Respiratory failure, unspecified with hypoxia: Secondary | ICD-10-CM | POA: Diagnosis not present

## 2017-03-05 DIAGNOSIS — J9622 Acute and chronic respiratory failure with hypercapnia: Secondary | ICD-10-CM | POA: Diagnosis not present

## 2017-03-05 DIAGNOSIS — D491 Neoplasm of unspecified behavior of respiratory system: Secondary | ICD-10-CM | POA: Diagnosis not present

## 2017-03-05 DIAGNOSIS — I959 Hypotension, unspecified: Secondary | ICD-10-CM | POA: Diagnosis present

## 2017-03-05 DIAGNOSIS — G4733 Obstructive sleep apnea (adult) (pediatric): Secondary | ICD-10-CM | POA: Diagnosis present

## 2017-03-05 DIAGNOSIS — T8612 Kidney transplant failure: Secondary | ICD-10-CM | POA: Diagnosis present

## 2017-03-05 DIAGNOSIS — J441 Chronic obstructive pulmonary disease with (acute) exacerbation: Secondary | ICD-10-CM | POA: Diagnosis not present

## 2017-03-05 DIAGNOSIS — J9601 Acute respiratory failure with hypoxia: Secondary | ICD-10-CM | POA: Diagnosis not present

## 2017-03-05 DIAGNOSIS — R0902 Hypoxemia: Secondary | ICD-10-CM | POA: Diagnosis not present

## 2017-03-05 DIAGNOSIS — I110 Hypertensive heart disease with heart failure: Secondary | ICD-10-CM | POA: Diagnosis present

## 2017-03-05 DIAGNOSIS — R590 Localized enlarged lymph nodes: Secondary | ICD-10-CM | POA: Diagnosis not present

## 2017-03-05 DIAGNOSIS — R918 Other nonspecific abnormal finding of lung field: Secondary | ICD-10-CM | POA: Diagnosis present

## 2017-03-05 DIAGNOSIS — J9692 Respiratory failure, unspecified with hypercapnia: Secondary | ICD-10-CM | POA: Diagnosis not present

## 2017-03-05 DIAGNOSIS — R9431 Abnormal electrocardiogram [ECG] [EKG]: Secondary | ICD-10-CM | POA: Diagnosis not present

## 2017-03-05 DIAGNOSIS — I4892 Unspecified atrial flutter: Secondary | ICD-10-CM | POA: Diagnosis not present

## 2017-03-05 DIAGNOSIS — K219 Gastro-esophageal reflux disease without esophagitis: Secondary | ICD-10-CM | POA: Diagnosis not present

## 2017-03-05 DIAGNOSIS — D631 Anemia in chronic kidney disease: Secondary | ICD-10-CM | POA: Diagnosis present

## 2017-03-05 DIAGNOSIS — N055 Unspecified nephritic syndrome with diffuse mesangiocapillary glomerulonephritis: Secondary | ICD-10-CM | POA: Diagnosis present

## 2017-03-05 DIAGNOSIS — Z4682 Encounter for fitting and adjustment of non-vascular catheter: Secondary | ICD-10-CM | POA: Diagnosis not present

## 2017-03-05 DIAGNOSIS — I252 Old myocardial infarction: Secondary | ICD-10-CM | POA: Diagnosis not present

## 2017-03-05 DIAGNOSIS — Z992 Dependence on renal dialysis: Secondary | ICD-10-CM | POA: Diagnosis not present

## 2017-03-05 DIAGNOSIS — N186 End stage renal disease: Secondary | ICD-10-CM | POA: Diagnosis present

## 2017-03-05 DIAGNOSIS — I953 Hypotension of hemodialysis: Secondary | ICD-10-CM | POA: Diagnosis not present

## 2017-03-05 DIAGNOSIS — I4510 Unspecified right bundle-branch block: Secondary | ICD-10-CM | POA: Diagnosis not present

## 2017-03-05 DIAGNOSIS — Z452 Encounter for adjustment and management of vascular access device: Secondary | ICD-10-CM | POA: Diagnosis not present

## 2017-03-05 DIAGNOSIS — J9621 Acute and chronic respiratory failure with hypoxia: Secondary | ICD-10-CM | POA: Diagnosis not present

## 2017-03-05 DIAGNOSIS — K047 Periapical abscess without sinus: Secondary | ICD-10-CM | POA: Diagnosis present

## 2017-03-05 DIAGNOSIS — Z951 Presence of aortocoronary bypass graft: Secondary | ICD-10-CM | POA: Diagnosis not present

## 2017-03-05 DIAGNOSIS — K210 Gastro-esophageal reflux disease with esophagitis: Secondary | ICD-10-CM | POA: Diagnosis present

## 2017-03-05 DIAGNOSIS — I480 Paroxysmal atrial fibrillation: Secondary | ICD-10-CM | POA: Diagnosis present

## 2017-03-05 DIAGNOSIS — I484 Atypical atrial flutter: Secondary | ICD-10-CM | POA: Diagnosis not present

## 2017-03-05 DIAGNOSIS — I2510 Atherosclerotic heart disease of native coronary artery without angina pectoris: Secondary | ICD-10-CM | POA: Diagnosis present

## 2017-03-05 DIAGNOSIS — G35 Multiple sclerosis: Secondary | ICD-10-CM | POA: Diagnosis present

## 2017-03-18 DIAGNOSIS — D509 Iron deficiency anemia, unspecified: Secondary | ICD-10-CM | POA: Diagnosis not present

## 2017-03-18 DIAGNOSIS — N186 End stage renal disease: Secondary | ICD-10-CM | POA: Diagnosis not present

## 2017-03-18 DIAGNOSIS — E875 Hyperkalemia: Secondary | ICD-10-CM | POA: Diagnosis not present

## 2017-03-18 DIAGNOSIS — N2581 Secondary hyperparathyroidism of renal origin: Secondary | ICD-10-CM | POA: Diagnosis not present

## 2017-03-21 ENCOUNTER — Telehealth: Payer: Self-pay | Admitting: Cardiovascular Disease

## 2017-03-21 DIAGNOSIS — E875 Hyperkalemia: Secondary | ICD-10-CM | POA: Diagnosis not present

## 2017-03-21 DIAGNOSIS — D509 Iron deficiency anemia, unspecified: Secondary | ICD-10-CM | POA: Diagnosis not present

## 2017-03-21 DIAGNOSIS — N186 End stage renal disease: Secondary | ICD-10-CM | POA: Diagnosis not present

## 2017-03-21 DIAGNOSIS — N2581 Secondary hyperparathyroidism of renal origin: Secondary | ICD-10-CM | POA: Diagnosis not present

## 2017-03-21 NOTE — Telephone Encounter (Signed)
New message    Pt wife is calling asking for a call back this afternoon. She said she wants to update RN on pt condition.

## 2017-03-21 NOTE — Telephone Encounter (Signed)
FYI:  Called and spoke to patients wife, she wanted to advise Pat and Dr. Angelena Form of patients current condition. Patient was able to go to [place] for his ablation. They were able to do the right side ablation but unable to do the left side due to patient being on ventilator.   They have D/C plavix, as well as the warfarin. Patient was placed on a baby aspirin 81 mg daily as well as eliquis 2.5 mg daily.  They have increased patients midrin to 10 mg 3 times daily.   Patients breathing is good as well as his BP is stable. They found a spot on patients lung that they think is possibly cancerous. They were unable to do a biopsy on the lung due to being on the ventilator long.  They were able to check the lymph nodes. They are clear of cancerous.   Patient is in sinus rhythm.

## 2017-03-22 DIAGNOSIS — N186 End stage renal disease: Secondary | ICD-10-CM | POA: Diagnosis not present

## 2017-03-22 DIAGNOSIS — I4892 Unspecified atrial flutter: Secondary | ICD-10-CM | POA: Diagnosis not present

## 2017-03-22 DIAGNOSIS — T8612 Kidney transplant failure: Secondary | ICD-10-CM | POA: Diagnosis not present

## 2017-03-22 DIAGNOSIS — Z6824 Body mass index (BMI) 24.0-24.9, adult: Secondary | ICD-10-CM | POA: Diagnosis not present

## 2017-03-22 DIAGNOSIS — C3490 Malignant neoplasm of unspecified part of unspecified bronchus or lung: Secondary | ICD-10-CM | POA: Diagnosis not present

## 2017-03-22 DIAGNOSIS — N032 Chronic nephritic syndrome with diffuse membranous glomerulonephritis: Secondary | ICD-10-CM | POA: Diagnosis not present

## 2017-03-22 DIAGNOSIS — K920 Hematemesis: Secondary | ICD-10-CM | POA: Diagnosis not present

## 2017-03-22 DIAGNOSIS — K2211 Ulcer of esophagus with bleeding: Secondary | ICD-10-CM | POA: Diagnosis not present

## 2017-03-22 DIAGNOSIS — K922 Gastrointestinal hemorrhage, unspecified: Secondary | ICD-10-CM | POA: Diagnosis not present

## 2017-03-22 DIAGNOSIS — N2581 Secondary hyperparathyroidism of renal origin: Secondary | ICD-10-CM | POA: Diagnosis not present

## 2017-03-22 DIAGNOSIS — R197 Diarrhea, unspecified: Secondary | ICD-10-CM | POA: Diagnosis not present

## 2017-03-22 NOTE — Telephone Encounter (Signed)
Thanks

## 2017-03-23 DIAGNOSIS — N2581 Secondary hyperparathyroidism of renal origin: Secondary | ICD-10-CM | POA: Diagnosis not present

## 2017-03-23 DIAGNOSIS — N186 End stage renal disease: Secondary | ICD-10-CM | POA: Diagnosis not present

## 2017-03-23 DIAGNOSIS — D509 Iron deficiency anemia, unspecified: Secondary | ICD-10-CM | POA: Diagnosis not present

## 2017-03-23 DIAGNOSIS — E875 Hyperkalemia: Secondary | ICD-10-CM | POA: Diagnosis not present

## 2017-03-24 DIAGNOSIS — I517 Cardiomegaly: Secondary | ICD-10-CM | POA: Diagnosis not present

## 2017-03-24 DIAGNOSIS — K921 Melena: Secondary | ICD-10-CM | POA: Diagnosis not present

## 2017-03-24 DIAGNOSIS — I252 Old myocardial infarction: Secondary | ICD-10-CM | POA: Diagnosis not present

## 2017-03-24 DIAGNOSIS — Z7901 Long term (current) use of anticoagulants: Secondary | ICD-10-CM | POA: Diagnosis not present

## 2017-03-24 DIAGNOSIS — Z992 Dependence on renal dialysis: Secondary | ICD-10-CM | POA: Diagnosis not present

## 2017-03-24 DIAGNOSIS — Z87891 Personal history of nicotine dependence: Secondary | ICD-10-CM | POA: Diagnosis not present

## 2017-03-24 DIAGNOSIS — N2581 Secondary hyperparathyroidism of renal origin: Secondary | ICD-10-CM | POA: Diagnosis present

## 2017-03-24 DIAGNOSIS — Z9981 Dependence on supplemental oxygen: Secondary | ICD-10-CM | POA: Diagnosis not present

## 2017-03-24 DIAGNOSIS — I480 Paroxysmal atrial fibrillation: Secondary | ICD-10-CM | POA: Diagnosis present

## 2017-03-24 DIAGNOSIS — T8612 Kidney transplant failure: Secondary | ICD-10-CM | POA: Diagnosis present

## 2017-03-24 DIAGNOSIS — K210 Gastro-esophageal reflux disease with esophagitis: Secondary | ICD-10-CM | POA: Diagnosis present

## 2017-03-24 DIAGNOSIS — G4733 Obstructive sleep apnea (adult) (pediatric): Secondary | ICD-10-CM | POA: Diagnosis present

## 2017-03-24 DIAGNOSIS — K644 Residual hemorrhoidal skin tags: Secondary | ICD-10-CM | POA: Diagnosis not present

## 2017-03-24 DIAGNOSIS — K2210 Ulcer of esophagus without bleeding: Secondary | ICD-10-CM | POA: Diagnosis not present

## 2017-03-24 DIAGNOSIS — R194 Change in bowel habit: Secondary | ICD-10-CM | POA: Diagnosis not present

## 2017-03-24 DIAGNOSIS — K922 Gastrointestinal hemorrhage, unspecified: Secondary | ICD-10-CM | POA: Diagnosis not present

## 2017-03-24 DIAGNOSIS — K2211 Ulcer of esophagus with bleeding: Secondary | ICD-10-CM | POA: Diagnosis present

## 2017-03-24 DIAGNOSIS — D649 Anemia, unspecified: Secondary | ICD-10-CM | POA: Diagnosis not present

## 2017-03-24 DIAGNOSIS — R918 Other nonspecific abnormal finding of lung field: Secondary | ICD-10-CM | POA: Diagnosis not present

## 2017-03-24 DIAGNOSIS — N186 End stage renal disease: Secondary | ICD-10-CM | POA: Diagnosis present

## 2017-03-24 DIAGNOSIS — K5730 Diverticulosis of large intestine without perforation or abscess without bleeding: Secondary | ICD-10-CM | POA: Diagnosis present

## 2017-03-24 DIAGNOSIS — J811 Chronic pulmonary edema: Secondary | ICD-10-CM | POA: Diagnosis not present

## 2017-03-24 DIAGNOSIS — J449 Chronic obstructive pulmonary disease, unspecified: Secondary | ICD-10-CM | POA: Diagnosis present

## 2017-03-24 DIAGNOSIS — K3189 Other diseases of stomach and duodenum: Secondary | ICD-10-CM | POA: Diagnosis not present

## 2017-03-24 DIAGNOSIS — Z951 Presence of aortocoronary bypass graft: Secondary | ICD-10-CM | POA: Diagnosis not present

## 2017-03-24 DIAGNOSIS — I4892 Unspecified atrial flutter: Secondary | ICD-10-CM | POA: Diagnosis present

## 2017-03-24 DIAGNOSIS — R197 Diarrhea, unspecified: Secondary | ICD-10-CM | POA: Diagnosis not present

## 2017-03-24 DIAGNOSIS — D631 Anemia in chronic kidney disease: Secondary | ICD-10-CM | POA: Diagnosis present

## 2017-03-24 DIAGNOSIS — K649 Unspecified hemorrhoids: Secondary | ICD-10-CM | POA: Diagnosis not present

## 2017-03-24 DIAGNOSIS — G35 Multiple sclerosis: Secondary | ICD-10-CM | POA: Diagnosis present

## 2017-03-24 DIAGNOSIS — R1310 Dysphagia, unspecified: Secondary | ICD-10-CM | POA: Diagnosis not present

## 2017-03-24 DIAGNOSIS — Z880 Allergy status to penicillin: Secondary | ICD-10-CM | POA: Diagnosis not present

## 2017-03-24 DIAGNOSIS — I959 Hypotension, unspecified: Secondary | ICD-10-CM | POA: Diagnosis present

## 2017-03-24 DIAGNOSIS — E875 Hyperkalemia: Secondary | ICD-10-CM | POA: Diagnosis present

## 2017-03-24 DIAGNOSIS — Z953 Presence of xenogenic heart valve: Secondary | ICD-10-CM | POA: Diagnosis not present

## 2017-03-24 DIAGNOSIS — K920 Hematemesis: Secondary | ICD-10-CM | POA: Diagnosis not present

## 2017-03-24 DIAGNOSIS — N032 Chronic nephritic syndrome with diffuse membranous glomerulonephritis: Secondary | ICD-10-CM | POA: Diagnosis present

## 2017-03-24 DIAGNOSIS — I2510 Atherosclerotic heart disease of native coronary artery without angina pectoris: Secondary | ICD-10-CM | POA: Diagnosis present

## 2017-03-24 DIAGNOSIS — K209 Esophagitis, unspecified: Secondary | ICD-10-CM | POA: Diagnosis not present

## 2017-03-30 DIAGNOSIS — N186 End stage renal disease: Secondary | ICD-10-CM | POA: Diagnosis not present

## 2017-03-30 DIAGNOSIS — D509 Iron deficiency anemia, unspecified: Secondary | ICD-10-CM | POA: Diagnosis not present

## 2017-03-30 DIAGNOSIS — N2581 Secondary hyperparathyroidism of renal origin: Secondary | ICD-10-CM | POA: Diagnosis not present

## 2017-03-30 DIAGNOSIS — E875 Hyperkalemia: Secondary | ICD-10-CM | POA: Diagnosis not present

## 2017-04-01 DIAGNOSIS — N186 End stage renal disease: Secondary | ICD-10-CM | POA: Diagnosis not present

## 2017-04-01 DIAGNOSIS — D509 Iron deficiency anemia, unspecified: Secondary | ICD-10-CM | POA: Diagnosis not present

## 2017-04-01 DIAGNOSIS — E875 Hyperkalemia: Secondary | ICD-10-CM | POA: Diagnosis not present

## 2017-04-01 DIAGNOSIS — N2581 Secondary hyperparathyroidism of renal origin: Secondary | ICD-10-CM | POA: Diagnosis not present

## 2017-04-04 DIAGNOSIS — D509 Iron deficiency anemia, unspecified: Secondary | ICD-10-CM | POA: Diagnosis not present

## 2017-04-04 DIAGNOSIS — E875 Hyperkalemia: Secondary | ICD-10-CM | POA: Diagnosis not present

## 2017-04-04 DIAGNOSIS — N2581 Secondary hyperparathyroidism of renal origin: Secondary | ICD-10-CM | POA: Diagnosis not present

## 2017-04-04 DIAGNOSIS — N186 End stage renal disease: Secondary | ICD-10-CM | POA: Diagnosis not present

## 2017-04-05 DIAGNOSIS — Z992 Dependence on renal dialysis: Secondary | ICD-10-CM | POA: Diagnosis not present

## 2017-04-05 DIAGNOSIS — K921 Melena: Secondary | ICD-10-CM | POA: Diagnosis not present

## 2017-04-05 DIAGNOSIS — N186 End stage renal disease: Secondary | ICD-10-CM | POA: Diagnosis not present

## 2017-04-05 DIAGNOSIS — T8612 Kidney transplant failure: Secondary | ICD-10-CM | POA: Diagnosis not present

## 2017-04-06 DIAGNOSIS — N2581 Secondary hyperparathyroidism of renal origin: Secondary | ICD-10-CM | POA: Diagnosis not present

## 2017-04-06 DIAGNOSIS — D509 Iron deficiency anemia, unspecified: Secondary | ICD-10-CM | POA: Diagnosis not present

## 2017-04-06 DIAGNOSIS — N186 End stage renal disease: Secondary | ICD-10-CM | POA: Diagnosis not present

## 2017-04-06 DIAGNOSIS — E875 Hyperkalemia: Secondary | ICD-10-CM | POA: Diagnosis not present

## 2017-04-08 DIAGNOSIS — E875 Hyperkalemia: Secondary | ICD-10-CM | POA: Diagnosis not present

## 2017-04-08 DIAGNOSIS — N2581 Secondary hyperparathyroidism of renal origin: Secondary | ICD-10-CM | POA: Diagnosis not present

## 2017-04-08 DIAGNOSIS — N186 End stage renal disease: Secondary | ICD-10-CM | POA: Diagnosis not present

## 2017-04-08 DIAGNOSIS — D509 Iron deficiency anemia, unspecified: Secondary | ICD-10-CM | POA: Diagnosis not present

## 2017-04-11 ENCOUNTER — Telehealth: Payer: Self-pay | Admitting: Cardiovascular Disease

## 2017-04-11 DIAGNOSIS — E875 Hyperkalemia: Secondary | ICD-10-CM | POA: Diagnosis not present

## 2017-04-11 DIAGNOSIS — N186 End stage renal disease: Secondary | ICD-10-CM | POA: Diagnosis not present

## 2017-04-11 DIAGNOSIS — N2581 Secondary hyperparathyroidism of renal origin: Secondary | ICD-10-CM | POA: Diagnosis not present

## 2017-04-11 DIAGNOSIS — D509 Iron deficiency anemia, unspecified: Secondary | ICD-10-CM | POA: Diagnosis not present

## 2017-04-11 DIAGNOSIS — I4891 Unspecified atrial fibrillation: Secondary | ICD-10-CM

## 2017-04-11 NOTE — Telephone Encounter (Signed)
Will forward to [facility] to get patient in for appointment and labs

## 2017-04-11 NOTE — Telephone Encounter (Signed)
Patient was to have ablation at [facility] but due to high risk - he had the ablation done at [facility]. Patient was stared on Eliquis  @ [facility]  - which cause him to have a bleed and was readmitted and give 4 united of blood.  Dr. Antonieta Pert @ [facility] called and ask that he stop the Eliquis on the 1st of Aug and started Coumadin back on 8 th.  Need to know when he will need to be seen for  Coumadin check and recheck his blood count.

## 2017-04-11 NOTE — Telephone Encounter (Signed)
Called spoke with pt's wife, Dr Antonieta Pert at [facility] advised pt to stop Eliquis on 04/06/17 s/p GI bleed, advised pt to restart Warfarin on 04/13/17 at previous dosage regimen 3.75mg  QD except 7.5mg  on Mondays.  Made appt for INR check in Coumadin Clinic on 04/20/17.  Pt's wife states Dr Antonieta Pert also wants pt to have repeat CBC done at that time as well to recheck Hgb s/p GI bleed.  Advised pt's wife will need to get order to check CBC from Dr Rayann Heman, cannot be done in Coumadin Clnic will have to have drawn in lab.  Will forward message to Dr Rayann Heman to address.  Thanks

## 2017-04-11 NOTE — Telephone Encounter (Signed)
CBC ordered 

## 2017-04-13 DIAGNOSIS — N250 Renal osteodystrophy: Secondary | ICD-10-CM | POA: Diagnosis not present

## 2017-04-13 DIAGNOSIS — Z953 Presence of xenogenic heart valve: Secondary | ICD-10-CM | POA: Diagnosis not present

## 2017-04-13 DIAGNOSIS — C3412 Malignant neoplasm of upper lobe, left bronchus or lung: Secondary | ICD-10-CM | POA: Diagnosis present

## 2017-04-13 DIAGNOSIS — D649 Anemia, unspecified: Secondary | ICD-10-CM | POA: Diagnosis not present

## 2017-04-13 DIAGNOSIS — Z880 Allergy status to penicillin: Secondary | ICD-10-CM | POA: Diagnosis not present

## 2017-04-13 DIAGNOSIS — Z7901 Long term (current) use of anticoagulants: Secondary | ICD-10-CM | POA: Diagnosis not present

## 2017-04-13 DIAGNOSIS — D491 Neoplasm of unspecified behavior of respiratory system: Secondary | ICD-10-CM | POA: Diagnosis not present

## 2017-04-13 DIAGNOSIS — I120 Hypertensive chronic kidney disease with stage 5 chronic kidney disease or end stage renal disease: Secondary | ICD-10-CM | POA: Diagnosis not present

## 2017-04-13 DIAGNOSIS — Z7982 Long term (current) use of aspirin: Secondary | ICD-10-CM | POA: Diagnosis not present

## 2017-04-13 DIAGNOSIS — Z9889 Other specified postprocedural states: Secondary | ICD-10-CM | POA: Diagnosis not present

## 2017-04-13 DIAGNOSIS — N186 End stage renal disease: Secondary | ICD-10-CM | POA: Diagnosis present

## 2017-04-13 DIAGNOSIS — Z951 Presence of aortocoronary bypass graft: Secondary | ICD-10-CM | POA: Diagnosis not present

## 2017-04-13 DIAGNOSIS — R042 Hemoptysis: Secondary | ICD-10-CM | POA: Diagnosis not present

## 2017-04-13 DIAGNOSIS — R911 Solitary pulmonary nodule: Secondary | ICD-10-CM | POA: Diagnosis not present

## 2017-04-13 DIAGNOSIS — J984 Other disorders of lung: Secondary | ICD-10-CM | POA: Diagnosis not present

## 2017-04-13 DIAGNOSIS — R05 Cough: Secondary | ICD-10-CM | POA: Diagnosis not present

## 2017-04-13 DIAGNOSIS — R062 Wheezing: Secondary | ICD-10-CM | POA: Diagnosis not present

## 2017-04-13 DIAGNOSIS — F329 Major depressive disorder, single episode, unspecified: Secondary | ICD-10-CM | POA: Diagnosis present

## 2017-04-13 DIAGNOSIS — D631 Anemia in chronic kidney disease: Secondary | ICD-10-CM | POA: Diagnosis present

## 2017-04-13 DIAGNOSIS — Z992 Dependence on renal dialysis: Secondary | ICD-10-CM | POA: Diagnosis not present

## 2017-04-13 DIAGNOSIS — R918 Other nonspecific abnormal finding of lung field: Secondary | ICD-10-CM | POA: Diagnosis not present

## 2017-04-13 DIAGNOSIS — Z6829 Body mass index (BMI) 29.0-29.9, adult: Secondary | ICD-10-CM | POA: Diagnosis not present

## 2017-04-13 DIAGNOSIS — G4733 Obstructive sleep apnea (adult) (pediatric): Secondary | ICD-10-CM | POA: Diagnosis present

## 2017-04-13 DIAGNOSIS — I4892 Unspecified atrial flutter: Secondary | ICD-10-CM | POA: Diagnosis not present

## 2017-04-13 DIAGNOSIS — C3492 Malignant neoplasm of unspecified part of left bronchus or lung: Secondary | ICD-10-CM | POA: Diagnosis not present

## 2017-04-13 DIAGNOSIS — K210 Gastro-esophageal reflux disease with esophagitis: Secondary | ICD-10-CM | POA: Diagnosis present

## 2017-04-13 DIAGNOSIS — G35 Multiple sclerosis: Secondary | ICD-10-CM | POA: Diagnosis present

## 2017-04-13 DIAGNOSIS — I2510 Atherosclerotic heart disease of native coronary artery without angina pectoris: Secondary | ICD-10-CM | POA: Diagnosis present

## 2017-04-13 DIAGNOSIS — Z87891 Personal history of nicotine dependence: Secondary | ICD-10-CM | POA: Diagnosis not present

## 2017-04-13 DIAGNOSIS — I252 Old myocardial infarction: Secondary | ICD-10-CM | POA: Diagnosis not present

## 2017-04-13 DIAGNOSIS — J449 Chronic obstructive pulmonary disease, unspecified: Secondary | ICD-10-CM | POA: Diagnosis not present

## 2017-04-14 DIAGNOSIS — C3412 Malignant neoplasm of upper lobe, left bronchus or lung: Secondary | ICD-10-CM | POA: Diagnosis not present

## 2017-04-15 ENCOUNTER — Telehealth: Payer: Self-pay | Admitting: Cardiovascular Disease

## 2017-04-15 DIAGNOSIS — D509 Iron deficiency anemia, unspecified: Secondary | ICD-10-CM | POA: Diagnosis not present

## 2017-04-15 DIAGNOSIS — N2581 Secondary hyperparathyroidism of renal origin: Secondary | ICD-10-CM | POA: Diagnosis not present

## 2017-04-15 DIAGNOSIS — E875 Hyperkalemia: Secondary | ICD-10-CM | POA: Diagnosis not present

## 2017-04-15 DIAGNOSIS — N186 End stage renal disease: Secondary | ICD-10-CM | POA: Diagnosis not present

## 2017-04-15 NOTE — Telephone Encounter (Signed)
He has had a recent ablation at [place]. If they are not planning on following him there, then he should keep follow up with Dr. Rayann Heman in 6 weeks. I will see what Dr. Rayann Heman thinks about this. No other recs right now. Please tell her we are hoping he turns the corner and these issues settle down. [name]

## 2017-04-15 NOTE — Telephone Encounter (Signed)
New message     Per wife pt is admitted to [facility] due to a bleed in young, pt wife would like to discuss his condition with you

## 2017-04-15 NOTE — Telephone Encounter (Signed)
I agree with Dr Angelena Form.  I think that given bleeding complications post ablation, premature cessation of anticoagulation, and early afib recurrence that he should probably follow very closely at [facility] until stable.  Once everything is settled, I am happy to see him for long term follow-up if he would like.  I am also happy to see him as scheduled 06/01/17 to make sure that he is getting everything that he needs from [facility] in the interim.  Thompson Grayer MD, [facility] 04/15/2017 3:17 PM

## 2017-04-15 NOTE — Telephone Encounter (Signed)
Left message to call back  

## 2017-04-15 NOTE — Telephone Encounter (Signed)
I spoke with pt's wife who is calling to give [place] an update on pt.  She reports pt had ablation done but then had esophageal bleeding. Eliquis was stopped for awhile.  He was being changed to coumadin but started coughing up blood prior to changing to coumadin.  Seen at [facility] on 8/7 and bleeding felt to be from lung.  Lung biopsy was done and lesion cauterized.  He is now off blood thinners. He is seeing Dr. Antonieta Pert at [facility] on 9/7 and it will be determined at that visit when blood thinners will be resumed.  He has also gone into Afib and medications have been adjusted at [facility].  Wife also wanted to make [place] aware that pt's blood pressure when checked by monitoring devices during procedures was different than BP cuff readings and he was able to tolerate procedures. Pt is scheduled to see Dr. Rayann Heman on 06/01/17 and wife is asking if she should still keep this appointment with recent changes. Will forward to Dr. Angelena Form and Dr. Rayann Heman.

## 2017-04-18 DIAGNOSIS — N186 End stage renal disease: Secondary | ICD-10-CM | POA: Diagnosis not present

## 2017-04-18 DIAGNOSIS — N2581 Secondary hyperparathyroidism of renal origin: Secondary | ICD-10-CM | POA: Diagnosis not present

## 2017-04-18 DIAGNOSIS — E875 Hyperkalemia: Secondary | ICD-10-CM | POA: Diagnosis not present

## 2017-04-18 DIAGNOSIS — D509 Iron deficiency anemia, unspecified: Secondary | ICD-10-CM | POA: Diagnosis not present

## 2017-04-20 ENCOUNTER — Other Ambulatory Visit: Payer: Medicare Other

## 2017-04-20 DIAGNOSIS — E875 Hyperkalemia: Secondary | ICD-10-CM | POA: Diagnosis not present

## 2017-04-20 DIAGNOSIS — N2581 Secondary hyperparathyroidism of renal origin: Secondary | ICD-10-CM | POA: Diagnosis not present

## 2017-04-20 DIAGNOSIS — N186 End stage renal disease: Secondary | ICD-10-CM | POA: Diagnosis not present

## 2017-04-20 DIAGNOSIS — D509 Iron deficiency anemia, unspecified: Secondary | ICD-10-CM | POA: Diagnosis not present

## 2017-04-21 DIAGNOSIS — Z952 Presence of prosthetic heart valve: Secondary | ICD-10-CM | POA: Diagnosis not present

## 2017-04-21 DIAGNOSIS — I2510 Atherosclerotic heart disease of native coronary artery without angina pectoris: Secondary | ICD-10-CM | POA: Diagnosis not present

## 2017-04-21 DIAGNOSIS — G4733 Obstructive sleep apnea (adult) (pediatric): Secondary | ICD-10-CM | POA: Diagnosis not present

## 2017-04-21 DIAGNOSIS — G35 Multiple sclerosis: Secondary | ICD-10-CM | POA: Diagnosis not present

## 2017-04-21 DIAGNOSIS — I358 Other nonrheumatic aortic valve disorders: Secondary | ICD-10-CM | POA: Diagnosis not present

## 2017-04-21 DIAGNOSIS — J90 Pleural effusion, not elsewhere classified: Secondary | ICD-10-CM | POA: Diagnosis not present

## 2017-04-21 DIAGNOSIS — R188 Other ascites: Secondary | ICD-10-CM | POA: Diagnosis not present

## 2017-04-21 DIAGNOSIS — Z992 Dependence on renal dialysis: Secondary | ICD-10-CM | POA: Diagnosis not present

## 2017-04-21 DIAGNOSIS — C3412 Malignant neoplasm of upper lobe, left bronchus or lung: Secondary | ICD-10-CM | POA: Diagnosis not present

## 2017-04-21 DIAGNOSIS — Z951 Presence of aortocoronary bypass graft: Secondary | ICD-10-CM | POA: Diagnosis not present

## 2017-04-21 DIAGNOSIS — I252 Old myocardial infarction: Secondary | ICD-10-CM | POA: Diagnosis not present

## 2017-04-21 DIAGNOSIS — C3490 Malignant neoplasm of unspecified part of unspecified bronchus or lung: Secondary | ICD-10-CM | POA: Insufficient documentation

## 2017-04-21 DIAGNOSIS — Z87891 Personal history of nicotine dependence: Secondary | ICD-10-CM | POA: Diagnosis not present

## 2017-04-21 DIAGNOSIS — N186 End stage renal disease: Secondary | ICD-10-CM | POA: Diagnosis not present

## 2017-04-21 DIAGNOSIS — I480 Paroxysmal atrial fibrillation: Secondary | ICD-10-CM | POA: Diagnosis not present

## 2017-04-22 DIAGNOSIS — N2581 Secondary hyperparathyroidism of renal origin: Secondary | ICD-10-CM | POA: Diagnosis not present

## 2017-04-22 DIAGNOSIS — E875 Hyperkalemia: Secondary | ICD-10-CM | POA: Diagnosis not present

## 2017-04-22 DIAGNOSIS — N186 End stage renal disease: Secondary | ICD-10-CM | POA: Diagnosis not present

## 2017-04-22 DIAGNOSIS — D509 Iron deficiency anemia, unspecified: Secondary | ICD-10-CM | POA: Diagnosis not present

## 2017-04-25 DIAGNOSIS — D509 Iron deficiency anemia, unspecified: Secondary | ICD-10-CM | POA: Diagnosis not present

## 2017-04-25 DIAGNOSIS — E875 Hyperkalemia: Secondary | ICD-10-CM | POA: Diagnosis not present

## 2017-04-25 DIAGNOSIS — N186 End stage renal disease: Secondary | ICD-10-CM | POA: Diagnosis not present

## 2017-04-25 DIAGNOSIS — N2581 Secondary hyperparathyroidism of renal origin: Secondary | ICD-10-CM | POA: Diagnosis not present

## 2017-04-27 DIAGNOSIS — D509 Iron deficiency anemia, unspecified: Secondary | ICD-10-CM | POA: Diagnosis not present

## 2017-04-27 DIAGNOSIS — E875 Hyperkalemia: Secondary | ICD-10-CM | POA: Diagnosis not present

## 2017-04-27 DIAGNOSIS — N186 End stage renal disease: Secondary | ICD-10-CM | POA: Diagnosis not present

## 2017-04-27 DIAGNOSIS — N2581 Secondary hyperparathyroidism of renal origin: Secondary | ICD-10-CM | POA: Diagnosis not present

## 2017-04-27 NOTE — Addendum Note (Signed)
Addendum  created 04/27/17 1358 by Albertha Ghee, MD   Sign clinical note

## 2017-04-27 NOTE — Addendum Note (Signed)
Addendum  created 04/27/17 1359 by Albertha Ghee, MD   Sign clinical note

## 2017-04-28 DIAGNOSIS — I2510 Atherosclerotic heart disease of native coronary artery without angina pectoris: Secondary | ICD-10-CM | POA: Diagnosis not present

## 2017-04-28 DIAGNOSIS — R188 Other ascites: Secondary | ICD-10-CM | POA: Diagnosis not present

## 2017-04-28 DIAGNOSIS — N186 End stage renal disease: Secondary | ICD-10-CM | POA: Diagnosis not present

## 2017-04-28 DIAGNOSIS — C3412 Malignant neoplasm of upper lobe, left bronchus or lung: Secondary | ICD-10-CM | POA: Diagnosis not present

## 2017-04-28 DIAGNOSIS — I358 Other nonrheumatic aortic valve disorders: Secondary | ICD-10-CM | POA: Diagnosis not present

## 2017-04-28 DIAGNOSIS — J90 Pleural effusion, not elsewhere classified: Secondary | ICD-10-CM | POA: Diagnosis not present

## 2017-04-29 DIAGNOSIS — D509 Iron deficiency anemia, unspecified: Secondary | ICD-10-CM | POA: Diagnosis not present

## 2017-04-29 DIAGNOSIS — I358 Other nonrheumatic aortic valve disorders: Secondary | ICD-10-CM | POA: Diagnosis not present

## 2017-04-29 DIAGNOSIS — C3412 Malignant neoplasm of upper lobe, left bronchus or lung: Secondary | ICD-10-CM | POA: Diagnosis not present

## 2017-04-29 DIAGNOSIS — J90 Pleural effusion, not elsewhere classified: Secondary | ICD-10-CM | POA: Diagnosis not present

## 2017-04-29 DIAGNOSIS — E875 Hyperkalemia: Secondary | ICD-10-CM | POA: Diagnosis not present

## 2017-04-29 DIAGNOSIS — R188 Other ascites: Secondary | ICD-10-CM | POA: Diagnosis not present

## 2017-04-29 DIAGNOSIS — N186 End stage renal disease: Secondary | ICD-10-CM | POA: Diagnosis not present

## 2017-04-29 DIAGNOSIS — N2581 Secondary hyperparathyroidism of renal origin: Secondary | ICD-10-CM | POA: Diagnosis not present

## 2017-04-29 DIAGNOSIS — I2510 Atherosclerotic heart disease of native coronary artery without angina pectoris: Secondary | ICD-10-CM | POA: Diagnosis not present

## 2017-05-02 DIAGNOSIS — D509 Iron deficiency anemia, unspecified: Secondary | ICD-10-CM | POA: Diagnosis not present

## 2017-05-02 DIAGNOSIS — I2510 Atherosclerotic heart disease of native coronary artery without angina pectoris: Secondary | ICD-10-CM | POA: Diagnosis not present

## 2017-05-02 DIAGNOSIS — N186 End stage renal disease: Secondary | ICD-10-CM | POA: Diagnosis not present

## 2017-05-02 DIAGNOSIS — E875 Hyperkalemia: Secondary | ICD-10-CM | POA: Diagnosis not present

## 2017-05-02 DIAGNOSIS — I358 Other nonrheumatic aortic valve disorders: Secondary | ICD-10-CM | POA: Diagnosis not present

## 2017-05-02 DIAGNOSIS — C3412 Malignant neoplasm of upper lobe, left bronchus or lung: Secondary | ICD-10-CM | POA: Diagnosis not present

## 2017-05-02 DIAGNOSIS — N2581 Secondary hyperparathyroidism of renal origin: Secondary | ICD-10-CM | POA: Diagnosis not present

## 2017-05-02 DIAGNOSIS — J90 Pleural effusion, not elsewhere classified: Secondary | ICD-10-CM | POA: Diagnosis not present

## 2017-05-02 DIAGNOSIS — R188 Other ascites: Secondary | ICD-10-CM | POA: Diagnosis not present

## 2017-05-04 DIAGNOSIS — J90 Pleural effusion, not elsewhere classified: Secondary | ICD-10-CM | POA: Diagnosis not present

## 2017-05-04 DIAGNOSIS — E875 Hyperkalemia: Secondary | ICD-10-CM | POA: Diagnosis not present

## 2017-05-04 DIAGNOSIS — N186 End stage renal disease: Secondary | ICD-10-CM | POA: Diagnosis not present

## 2017-05-04 DIAGNOSIS — N2581 Secondary hyperparathyroidism of renal origin: Secondary | ICD-10-CM | POA: Diagnosis not present

## 2017-05-04 DIAGNOSIS — D509 Iron deficiency anemia, unspecified: Secondary | ICD-10-CM | POA: Diagnosis not present

## 2017-05-04 DIAGNOSIS — I2510 Atherosclerotic heart disease of native coronary artery without angina pectoris: Secondary | ICD-10-CM | POA: Diagnosis not present

## 2017-05-04 DIAGNOSIS — C3412 Malignant neoplasm of upper lobe, left bronchus or lung: Secondary | ICD-10-CM | POA: Diagnosis not present

## 2017-05-04 DIAGNOSIS — R188 Other ascites: Secondary | ICD-10-CM | POA: Diagnosis not present

## 2017-05-04 DIAGNOSIS — I358 Other nonrheumatic aortic valve disorders: Secondary | ICD-10-CM | POA: Diagnosis not present

## 2017-05-05 ENCOUNTER — Other Ambulatory Visit: Payer: Self-pay | Admitting: Physician Assistant

## 2017-05-06 DIAGNOSIS — I358 Other nonrheumatic aortic valve disorders: Secondary | ICD-10-CM | POA: Diagnosis not present

## 2017-05-06 DIAGNOSIS — J90 Pleural effusion, not elsewhere classified: Secondary | ICD-10-CM | POA: Diagnosis not present

## 2017-05-06 DIAGNOSIS — D509 Iron deficiency anemia, unspecified: Secondary | ICD-10-CM | POA: Diagnosis not present

## 2017-05-06 DIAGNOSIS — C3412 Malignant neoplasm of upper lobe, left bronchus or lung: Secondary | ICD-10-CM | POA: Diagnosis not present

## 2017-05-06 DIAGNOSIS — Z992 Dependence on renal dialysis: Secondary | ICD-10-CM | POA: Diagnosis not present

## 2017-05-06 DIAGNOSIS — E875 Hyperkalemia: Secondary | ICD-10-CM | POA: Diagnosis not present

## 2017-05-06 DIAGNOSIS — N186 End stage renal disease: Secondary | ICD-10-CM | POA: Diagnosis not present

## 2017-05-06 DIAGNOSIS — R188 Other ascites: Secondary | ICD-10-CM | POA: Diagnosis not present

## 2017-05-06 DIAGNOSIS — T8612 Kidney transplant failure: Secondary | ICD-10-CM | POA: Diagnosis not present

## 2017-05-06 DIAGNOSIS — I2510 Atherosclerotic heart disease of native coronary artery without angina pectoris: Secondary | ICD-10-CM | POA: Diagnosis not present

## 2017-05-06 DIAGNOSIS — N2581 Secondary hyperparathyroidism of renal origin: Secondary | ICD-10-CM | POA: Diagnosis not present

## 2017-05-09 DIAGNOSIS — N186 End stage renal disease: Secondary | ICD-10-CM | POA: Diagnosis not present

## 2017-05-09 DIAGNOSIS — D509 Iron deficiency anemia, unspecified: Secondary | ICD-10-CM | POA: Diagnosis not present

## 2017-05-09 DIAGNOSIS — E875 Hyperkalemia: Secondary | ICD-10-CM | POA: Diagnosis not present

## 2017-05-09 DIAGNOSIS — N2581 Secondary hyperparathyroidism of renal origin: Secondary | ICD-10-CM | POA: Diagnosis not present

## 2017-05-10 DIAGNOSIS — I480 Paroxysmal atrial fibrillation: Secondary | ICD-10-CM | POA: Diagnosis not present

## 2017-05-10 DIAGNOSIS — G4733 Obstructive sleep apnea (adult) (pediatric): Secondary | ICD-10-CM | POA: Diagnosis not present

## 2017-05-10 DIAGNOSIS — C3412 Malignant neoplasm of upper lobe, left bronchus or lung: Secondary | ICD-10-CM | POA: Diagnosis not present

## 2017-05-10 DIAGNOSIS — Z951 Presence of aortocoronary bypass graft: Secondary | ICD-10-CM | POA: Diagnosis not present

## 2017-05-10 DIAGNOSIS — Z87891 Personal history of nicotine dependence: Secondary | ICD-10-CM | POA: Diagnosis not present

## 2017-05-10 DIAGNOSIS — I358 Other nonrheumatic aortic valve disorders: Secondary | ICD-10-CM | POA: Diagnosis not present

## 2017-05-10 DIAGNOSIS — N186 End stage renal disease: Secondary | ICD-10-CM | POA: Diagnosis not present

## 2017-05-10 DIAGNOSIS — I2510 Atherosclerotic heart disease of native coronary artery without angina pectoris: Secondary | ICD-10-CM | POA: Diagnosis not present

## 2017-05-10 DIAGNOSIS — Z992 Dependence on renal dialysis: Secondary | ICD-10-CM | POA: Diagnosis not present

## 2017-05-10 DIAGNOSIS — Z952 Presence of prosthetic heart valve: Secondary | ICD-10-CM | POA: Diagnosis not present

## 2017-05-10 DIAGNOSIS — R188 Other ascites: Secondary | ICD-10-CM | POA: Diagnosis not present

## 2017-05-10 DIAGNOSIS — J90 Pleural effusion, not elsewhere classified: Secondary | ICD-10-CM | POA: Diagnosis not present

## 2017-05-10 DIAGNOSIS — I252 Old myocardial infarction: Secondary | ICD-10-CM | POA: Diagnosis not present

## 2017-05-10 DIAGNOSIS — G35 Multiple sclerosis: Secondary | ICD-10-CM | POA: Diagnosis not present

## 2017-05-10 NOTE — Telephone Encounter (Signed)
Left message to call back  

## 2017-05-11 DIAGNOSIS — E875 Hyperkalemia: Secondary | ICD-10-CM | POA: Diagnosis not present

## 2017-05-11 DIAGNOSIS — D509 Iron deficiency anemia, unspecified: Secondary | ICD-10-CM | POA: Diagnosis not present

## 2017-05-11 DIAGNOSIS — N2581 Secondary hyperparathyroidism of renal origin: Secondary | ICD-10-CM | POA: Diagnosis not present

## 2017-05-11 DIAGNOSIS — N186 End stage renal disease: Secondary | ICD-10-CM | POA: Diagnosis not present

## 2017-05-13 DIAGNOSIS — N186 End stage renal disease: Secondary | ICD-10-CM | POA: Diagnosis not present

## 2017-05-13 DIAGNOSIS — I252 Old myocardial infarction: Secondary | ICD-10-CM | POA: Diagnosis not present

## 2017-05-13 DIAGNOSIS — R9431 Abnormal electrocardiogram [ECG] [EKG]: Secondary | ICD-10-CM | POA: Diagnosis not present

## 2017-05-13 DIAGNOSIS — I2510 Atherosclerotic heart disease of native coronary artery without angina pectoris: Secondary | ICD-10-CM | POA: Diagnosis not present

## 2017-05-13 DIAGNOSIS — Z6823 Body mass index (BMI) 23.0-23.9, adult: Secondary | ICD-10-CM | POA: Diagnosis not present

## 2017-05-13 DIAGNOSIS — G35 Multiple sclerosis: Secondary | ICD-10-CM | POA: Diagnosis not present

## 2017-05-13 DIAGNOSIS — I4892 Unspecified atrial flutter: Secondary | ICD-10-CM | POA: Diagnosis not present

## 2017-05-13 DIAGNOSIS — I359 Nonrheumatic aortic valve disorder, unspecified: Secondary | ICD-10-CM | POA: Diagnosis not present

## 2017-05-13 DIAGNOSIS — Z8249 Family history of ischemic heart disease and other diseases of the circulatory system: Secondary | ICD-10-CM | POA: Diagnosis not present

## 2017-05-13 DIAGNOSIS — G4733 Obstructive sleep apnea (adult) (pediatric): Secondary | ICD-10-CM | POA: Diagnosis not present

## 2017-05-13 DIAGNOSIS — E875 Hyperkalemia: Secondary | ICD-10-CM | POA: Diagnosis not present

## 2017-05-13 DIAGNOSIS — Z940 Kidney transplant status: Secondary | ICD-10-CM | POA: Diagnosis not present

## 2017-05-13 DIAGNOSIS — Z951 Presence of aortocoronary bypass graft: Secondary | ICD-10-CM | POA: Diagnosis not present

## 2017-05-13 DIAGNOSIS — K210 Gastro-esophageal reflux disease with esophagitis: Secondary | ICD-10-CM | POA: Diagnosis not present

## 2017-05-13 DIAGNOSIS — Z880 Allergy status to penicillin: Secondary | ICD-10-CM | POA: Diagnosis not present

## 2017-05-13 DIAGNOSIS — I480 Paroxysmal atrial fibrillation: Secondary | ICD-10-CM | POA: Diagnosis not present

## 2017-05-13 DIAGNOSIS — I959 Hypotension, unspecified: Secondary | ICD-10-CM | POA: Diagnosis not present

## 2017-05-13 DIAGNOSIS — Z923 Personal history of irradiation: Secondary | ICD-10-CM | POA: Diagnosis not present

## 2017-05-13 DIAGNOSIS — E213 Hyperparathyroidism, unspecified: Secondary | ICD-10-CM | POA: Diagnosis not present

## 2017-05-13 DIAGNOSIS — D509 Iron deficiency anemia, unspecified: Secondary | ICD-10-CM | POA: Diagnosis not present

## 2017-05-13 DIAGNOSIS — Z79899 Other long term (current) drug therapy: Secondary | ICD-10-CM | POA: Diagnosis not present

## 2017-05-13 DIAGNOSIS — Z953 Presence of xenogenic heart valve: Secondary | ICD-10-CM | POA: Diagnosis not present

## 2017-05-13 DIAGNOSIS — N2581 Secondary hyperparathyroidism of renal origin: Secondary | ICD-10-CM | POA: Diagnosis not present

## 2017-05-13 DIAGNOSIS — Z888 Allergy status to other drugs, medicaments and biological substances status: Secondary | ICD-10-CM | POA: Diagnosis not present

## 2017-05-13 DIAGNOSIS — C3490 Malignant neoplasm of unspecified part of unspecified bronchus or lung: Secondary | ICD-10-CM | POA: Diagnosis not present

## 2017-05-13 DIAGNOSIS — Z7982 Long term (current) use of aspirin: Secondary | ICD-10-CM | POA: Diagnosis not present

## 2017-05-13 DIAGNOSIS — Z9981 Dependence on supplemental oxygen: Secondary | ICD-10-CM | POA: Diagnosis not present

## 2017-05-13 DIAGNOSIS — Z992 Dependence on renal dialysis: Secondary | ICD-10-CM | POA: Diagnosis not present

## 2017-05-16 DIAGNOSIS — N186 End stage renal disease: Secondary | ICD-10-CM | POA: Diagnosis not present

## 2017-05-16 DIAGNOSIS — D509 Iron deficiency anemia, unspecified: Secondary | ICD-10-CM | POA: Diagnosis not present

## 2017-05-16 DIAGNOSIS — N2581 Secondary hyperparathyroidism of renal origin: Secondary | ICD-10-CM | POA: Diagnosis not present

## 2017-05-16 DIAGNOSIS — E875 Hyperkalemia: Secondary | ICD-10-CM | POA: Diagnosis not present

## 2017-05-17 DIAGNOSIS — T82858A Stenosis of vascular prosthetic devices, implants and grafts, initial encounter: Secondary | ICD-10-CM | POA: Diagnosis not present

## 2017-05-17 DIAGNOSIS — N186 End stage renal disease: Secondary | ICD-10-CM | POA: Diagnosis not present

## 2017-05-17 DIAGNOSIS — Z992 Dependence on renal dialysis: Secondary | ICD-10-CM | POA: Diagnosis not present

## 2017-05-17 DIAGNOSIS — I871 Compression of vein: Secondary | ICD-10-CM | POA: Diagnosis not present

## 2017-05-18 DIAGNOSIS — N2581 Secondary hyperparathyroidism of renal origin: Secondary | ICD-10-CM | POA: Diagnosis not present

## 2017-05-18 DIAGNOSIS — D509 Iron deficiency anemia, unspecified: Secondary | ICD-10-CM | POA: Diagnosis not present

## 2017-05-18 DIAGNOSIS — N186 End stage renal disease: Secondary | ICD-10-CM | POA: Diagnosis not present

## 2017-05-18 DIAGNOSIS — E875 Hyperkalemia: Secondary | ICD-10-CM | POA: Diagnosis not present

## 2017-05-20 DIAGNOSIS — D509 Iron deficiency anemia, unspecified: Secondary | ICD-10-CM | POA: Diagnosis not present

## 2017-05-20 DIAGNOSIS — N2581 Secondary hyperparathyroidism of renal origin: Secondary | ICD-10-CM | POA: Diagnosis not present

## 2017-05-20 DIAGNOSIS — N186 End stage renal disease: Secondary | ICD-10-CM | POA: Diagnosis not present

## 2017-05-20 DIAGNOSIS — E875 Hyperkalemia: Secondary | ICD-10-CM | POA: Diagnosis not present

## 2017-05-23 DIAGNOSIS — E875 Hyperkalemia: Secondary | ICD-10-CM | POA: Diagnosis not present

## 2017-05-23 DIAGNOSIS — N186 End stage renal disease: Secondary | ICD-10-CM | POA: Diagnosis not present

## 2017-05-23 DIAGNOSIS — D509 Iron deficiency anemia, unspecified: Secondary | ICD-10-CM | POA: Diagnosis not present

## 2017-05-23 DIAGNOSIS — N2581 Secondary hyperparathyroidism of renal origin: Secondary | ICD-10-CM | POA: Diagnosis not present

## 2017-05-24 DIAGNOSIS — K319 Disease of stomach and duodenum, unspecified: Secondary | ICD-10-CM | POA: Diagnosis not present

## 2017-05-24 DIAGNOSIS — Z880 Allergy status to penicillin: Secondary | ICD-10-CM | POA: Diagnosis not present

## 2017-05-24 DIAGNOSIS — I2510 Atherosclerotic heart disease of native coronary artery without angina pectoris: Secondary | ICD-10-CM | POA: Diagnosis not present

## 2017-05-24 DIAGNOSIS — G4733 Obstructive sleep apnea (adult) (pediatric): Secondary | ICD-10-CM | POA: Diagnosis not present

## 2017-05-24 DIAGNOSIS — I480 Paroxysmal atrial fibrillation: Secondary | ICD-10-CM | POA: Diagnosis not present

## 2017-05-24 DIAGNOSIS — K210 Gastro-esophageal reflux disease with esophagitis: Secondary | ICD-10-CM | POA: Diagnosis not present

## 2017-05-24 DIAGNOSIS — Z881 Allergy status to other antibiotic agents status: Secondary | ICD-10-CM | POA: Diagnosis not present

## 2017-05-24 DIAGNOSIS — Z888 Allergy status to other drugs, medicaments and biological substances status: Secondary | ICD-10-CM | POA: Diagnosis not present

## 2017-05-24 DIAGNOSIS — I252 Old myocardial infarction: Secondary | ICD-10-CM | POA: Diagnosis not present

## 2017-05-24 DIAGNOSIS — N186 End stage renal disease: Secondary | ICD-10-CM | POA: Diagnosis not present

## 2017-05-24 DIAGNOSIS — G35 Multiple sclerosis: Secondary | ICD-10-CM | POA: Diagnosis not present

## 2017-05-24 DIAGNOSIS — C3490 Malignant neoplasm of unspecified part of unspecified bronchus or lung: Secondary | ICD-10-CM | POA: Diagnosis not present

## 2017-05-24 DIAGNOSIS — Z954 Presence of other heart-valve replacement: Secondary | ICD-10-CM | POA: Diagnosis not present

## 2017-05-24 DIAGNOSIS — R042 Hemoptysis: Secondary | ICD-10-CM | POA: Diagnosis not present

## 2017-05-24 DIAGNOSIS — K228 Other specified diseases of esophagus: Secondary | ICD-10-CM | POA: Diagnosis not present

## 2017-05-24 DIAGNOSIS — I120 Hypertensive chronic kidney disease with stage 5 chronic kidney disease or end stage renal disease: Secondary | ICD-10-CM | POA: Diagnosis not present

## 2017-05-24 DIAGNOSIS — Z87891 Personal history of nicotine dependence: Secondary | ICD-10-CM | POA: Diagnosis not present

## 2017-05-24 DIAGNOSIS — T8612 Kidney transplant failure: Secondary | ICD-10-CM | POA: Diagnosis not present

## 2017-05-24 DIAGNOSIS — N179 Acute kidney failure, unspecified: Secondary | ICD-10-CM | POA: Diagnosis not present

## 2017-05-24 DIAGNOSIS — J449 Chronic obstructive pulmonary disease, unspecified: Secondary | ICD-10-CM | POA: Diagnosis not present

## 2017-05-24 DIAGNOSIS — I959 Hypotension, unspecified: Secondary | ICD-10-CM | POA: Diagnosis not present

## 2017-05-24 DIAGNOSIS — K766 Portal hypertension: Secondary | ICD-10-CM | POA: Diagnosis not present

## 2017-05-24 DIAGNOSIS — K3189 Other diseases of stomach and duodenum: Secondary | ICD-10-CM | POA: Diagnosis not present

## 2017-05-25 ENCOUNTER — Telehealth: Payer: Self-pay | Admitting: Cardiovascular Disease

## 2017-05-25 DIAGNOSIS — D509 Iron deficiency anemia, unspecified: Secondary | ICD-10-CM | POA: Diagnosis not present

## 2017-05-25 DIAGNOSIS — N2581 Secondary hyperparathyroidism of renal origin: Secondary | ICD-10-CM | POA: Diagnosis not present

## 2017-05-25 DIAGNOSIS — E875 Hyperkalemia: Secondary | ICD-10-CM | POA: Diagnosis not present

## 2017-05-25 DIAGNOSIS — N186 End stage renal disease: Secondary | ICD-10-CM | POA: Diagnosis not present

## 2017-05-25 NOTE — Telephone Encounter (Signed)
Mrs.Brisendine is returning a call to Mardene Celeste and is wanting to speak with her . Asking that Fraser Din give her a call when she gets back

## 2017-05-26 NOTE — Telephone Encounter (Signed)
I spoke with pt and gave him information from Dr. Angelena Form and Dr. Rayann Heman.  Pt reports he is following closely at [facility].  I asked him to let us know when [facility] wanted him to transition back to being followed in our office and we would schedule appointment at that time.  Pt reports he is feeling good at present time. Pt is in agreement with cancelling appointment with Dr. Rayann Heman on 9/26.

## 2017-05-26 NOTE — Telephone Encounter (Signed)
See phone note dated 04/15/17 that addresses this.

## 2017-05-27 ENCOUNTER — Telehealth: Payer: Self-pay | Admitting: Cardiovascular Disease

## 2017-05-27 DIAGNOSIS — N186 End stage renal disease: Secondary | ICD-10-CM | POA: Diagnosis not present

## 2017-05-27 DIAGNOSIS — E875 Hyperkalemia: Secondary | ICD-10-CM | POA: Diagnosis not present

## 2017-05-27 DIAGNOSIS — N2581 Secondary hyperparathyroidism of renal origin: Secondary | ICD-10-CM | POA: Diagnosis not present

## 2017-05-27 DIAGNOSIS — D509 Iron deficiency anemia, unspecified: Secondary | ICD-10-CM | POA: Diagnosis not present

## 2017-05-27 NOTE — Telephone Encounter (Signed)
I spoke with pt.  He states his wife wanted to let us know pt's next appt at [facility] is on 10/5.  They will check at that appt if he should continue at [facility] or will change to follow up in our office.

## 2017-05-27 NOTE — Telephone Encounter (Signed)
Joy ( Wife) is calling to update you on Mr. Lanuza physician visits at [facility]. Please call

## 2017-05-30 DIAGNOSIS — D509 Iron deficiency anemia, unspecified: Secondary | ICD-10-CM | POA: Diagnosis not present

## 2017-05-30 DIAGNOSIS — N2581 Secondary hyperparathyroidism of renal origin: Secondary | ICD-10-CM | POA: Diagnosis not present

## 2017-05-30 DIAGNOSIS — N186 End stage renal disease: Secondary | ICD-10-CM | POA: Diagnosis not present

## 2017-05-30 DIAGNOSIS — E875 Hyperkalemia: Secondary | ICD-10-CM | POA: Diagnosis not present

## 2017-06-01 ENCOUNTER — Ambulatory Visit: Payer: Medicare Other | Admitting: Internal Medicine

## 2017-06-01 DIAGNOSIS — N2581 Secondary hyperparathyroidism of renal origin: Secondary | ICD-10-CM | POA: Diagnosis not present

## 2017-06-01 DIAGNOSIS — N186 End stage renal disease: Secondary | ICD-10-CM | POA: Diagnosis not present

## 2017-06-01 DIAGNOSIS — D509 Iron deficiency anemia, unspecified: Secondary | ICD-10-CM | POA: Diagnosis not present

## 2017-06-01 DIAGNOSIS — E875 Hyperkalemia: Secondary | ICD-10-CM | POA: Diagnosis not present

## 2017-06-03 DIAGNOSIS — D509 Iron deficiency anemia, unspecified: Secondary | ICD-10-CM | POA: Diagnosis not present

## 2017-06-03 DIAGNOSIS — E875 Hyperkalemia: Secondary | ICD-10-CM | POA: Diagnosis not present

## 2017-06-03 DIAGNOSIS — N2581 Secondary hyperparathyroidism of renal origin: Secondary | ICD-10-CM | POA: Diagnosis not present

## 2017-06-03 DIAGNOSIS — N186 End stage renal disease: Secondary | ICD-10-CM | POA: Diagnosis not present

## 2017-06-05 DIAGNOSIS — Z992 Dependence on renal dialysis: Secondary | ICD-10-CM | POA: Diagnosis not present

## 2017-06-05 DIAGNOSIS — T8612 Kidney transplant failure: Secondary | ICD-10-CM | POA: Diagnosis not present

## 2017-06-05 DIAGNOSIS — N186 End stage renal disease: Secondary | ICD-10-CM | POA: Diagnosis not present

## 2017-06-06 DIAGNOSIS — Z23 Encounter for immunization: Secondary | ICD-10-CM | POA: Diagnosis not present

## 2017-06-06 DIAGNOSIS — N2581 Secondary hyperparathyroidism of renal origin: Secondary | ICD-10-CM | POA: Diagnosis not present

## 2017-06-06 DIAGNOSIS — E875 Hyperkalemia: Secondary | ICD-10-CM | POA: Diagnosis not present

## 2017-06-06 DIAGNOSIS — D509 Iron deficiency anemia, unspecified: Secondary | ICD-10-CM | POA: Diagnosis not present

## 2017-06-06 DIAGNOSIS — N186 End stage renal disease: Secondary | ICD-10-CM | POA: Diagnosis not present

## 2017-06-08 DIAGNOSIS — Z23 Encounter for immunization: Secondary | ICD-10-CM | POA: Diagnosis not present

## 2017-06-08 DIAGNOSIS — E875 Hyperkalemia: Secondary | ICD-10-CM | POA: Diagnosis not present

## 2017-06-08 DIAGNOSIS — N186 End stage renal disease: Secondary | ICD-10-CM | POA: Diagnosis not present

## 2017-06-08 DIAGNOSIS — N2581 Secondary hyperparathyroidism of renal origin: Secondary | ICD-10-CM | POA: Diagnosis not present

## 2017-06-08 DIAGNOSIS — D509 Iron deficiency anemia, unspecified: Secondary | ICD-10-CM | POA: Diagnosis not present

## 2017-06-10 DIAGNOSIS — N2581 Secondary hyperparathyroidism of renal origin: Secondary | ICD-10-CM | POA: Diagnosis not present

## 2017-06-10 DIAGNOSIS — Z23 Encounter for immunization: Secondary | ICD-10-CM | POA: Diagnosis not present

## 2017-06-10 DIAGNOSIS — T82868A Thrombosis of vascular prosthetic devices, implants and grafts, initial encounter: Secondary | ICD-10-CM | POA: Diagnosis not present

## 2017-06-10 DIAGNOSIS — H2513 Age-related nuclear cataract, bilateral: Secondary | ICD-10-CM | POA: Diagnosis not present

## 2017-06-10 DIAGNOSIS — D509 Iron deficiency anemia, unspecified: Secondary | ICD-10-CM | POA: Diagnosis not present

## 2017-06-10 DIAGNOSIS — E875 Hyperkalemia: Secondary | ICD-10-CM | POA: Diagnosis not present

## 2017-06-10 DIAGNOSIS — Z992 Dependence on renal dialysis: Secondary | ICD-10-CM | POA: Diagnosis not present

## 2017-06-10 DIAGNOSIS — N186 End stage renal disease: Secondary | ICD-10-CM | POA: Diagnosis not present

## 2017-06-11 DIAGNOSIS — N186 End stage renal disease: Secondary | ICD-10-CM | POA: Diagnosis not present

## 2017-06-11 DIAGNOSIS — E8770 Fluid overload, unspecified: Secondary | ICD-10-CM | POA: Diagnosis not present

## 2017-06-11 DIAGNOSIS — N2581 Secondary hyperparathyroidism of renal origin: Secondary | ICD-10-CM | POA: Diagnosis not present

## 2017-06-13 ENCOUNTER — Telehealth: Payer: Self-pay | Admitting: Cardiovascular Disease

## 2017-06-13 DIAGNOSIS — D509 Iron deficiency anemia, unspecified: Secondary | ICD-10-CM | POA: Diagnosis not present

## 2017-06-13 DIAGNOSIS — N186 End stage renal disease: Secondary | ICD-10-CM | POA: Diagnosis not present

## 2017-06-13 DIAGNOSIS — E875 Hyperkalemia: Secondary | ICD-10-CM | POA: Diagnosis not present

## 2017-06-13 DIAGNOSIS — N2581 Secondary hyperparathyroidism of renal origin: Secondary | ICD-10-CM | POA: Diagnosis not present

## 2017-06-13 DIAGNOSIS — Z23 Encounter for immunization: Secondary | ICD-10-CM | POA: Diagnosis not present

## 2017-06-13 NOTE — Telephone Encounter (Signed)
New message      They did not get to go to [facility] due to [facility] fistula , they can not get him in until December, they are concerned about how thick his blood is and calcification

## 2017-06-13 NOTE — Telephone Encounter (Signed)
I spoke with patient's wife, [name]. Pt had an appointment to see Dr Antonieta Pert in [place] last Thursday, plan was to discuss restarting anticoagulation. Pt was unable to keep that appointment because of problems with fistula/ dialysis. Pt's wife called Dr Marin Olp office, explained reason for missing appointment, first appointment with Dr Antonieta Pert was not  until December 2018. Pt's wife states  issues with anticoagulation were not addressed at time of call to Dr Marin Olp office even though pt's wife asked to speak with Dr Antonieta Pert or get a call back about anticoagulation recommendations.  Pt's wife asking for an appointment with Dr Angelena Form to help with anticoagulation recommendations.  I have scheduled pt to see Dr Angelena Form 10/15 /18, will forward to Pat/ Dr Angelena Form for review.

## 2017-06-15 DIAGNOSIS — N186 End stage renal disease: Secondary | ICD-10-CM | POA: Diagnosis not present

## 2017-06-15 DIAGNOSIS — N2581 Secondary hyperparathyroidism of renal origin: Secondary | ICD-10-CM | POA: Diagnosis not present

## 2017-06-15 DIAGNOSIS — Z23 Encounter for immunization: Secondary | ICD-10-CM | POA: Diagnosis not present

## 2017-06-15 DIAGNOSIS — E875 Hyperkalemia: Secondary | ICD-10-CM | POA: Diagnosis not present

## 2017-06-15 DIAGNOSIS — D509 Iron deficiency anemia, unspecified: Secondary | ICD-10-CM | POA: Diagnosis not present

## 2017-06-16 ENCOUNTER — Ambulatory Visit: Payer: Medicare Other | Admitting: Internal Medicine

## 2017-06-16 ENCOUNTER — Ambulatory Visit (INDEPENDENT_AMBULATORY_CARE_PROVIDER_SITE_OTHER)
Admission: RE | Admit: 2017-06-16 | Discharge: 2017-06-16 | Disposition: A | Discharge status: Home / Self Care | Payer: Medicare Other | Source: Ambulatory Visit | Attending: Internal Medicine | Admitting: Internal Medicine

## 2017-06-16 ENCOUNTER — Encounter: Payer: Self-pay | Admitting: Internal Medicine

## 2017-06-16 ENCOUNTER — Ambulatory Visit (INDEPENDENT_AMBULATORY_CARE_PROVIDER_SITE_OTHER): Payer: Medicare Other | Admitting: Internal Medicine

## 2017-06-16 ENCOUNTER — Other Ambulatory Visit: Payer: Self-pay

## 2017-06-16 VITALS — BP 90/60 | HR 118 | Ht 70.0 in | Wt 173.0 lb

## 2017-06-16 DIAGNOSIS — J449 Chronic obstructive pulmonary disease, unspecified: Secondary | ICD-10-CM

## 2017-06-16 DIAGNOSIS — Z9861 Coronary angioplasty status: Secondary | ICD-10-CM

## 2017-06-16 DIAGNOSIS — I2510 Atherosclerotic heart disease of native coronary artery without angina pectoris: Secondary | ICD-10-CM

## 2017-06-16 DIAGNOSIS — N186 End stage renal disease: Secondary | ICD-10-CM

## 2017-06-16 DIAGNOSIS — Z01810 Encounter for preprocedural cardiovascular examination: Secondary | ICD-10-CM

## 2017-06-16 DIAGNOSIS — R05 Cough: Secondary | ICD-10-CM | POA: Diagnosis not present

## 2017-06-16 DIAGNOSIS — R0602 Shortness of breath: Secondary | ICD-10-CM | POA: Diagnosis not present

## 2017-06-16 MED ORDER — TIOTROPIUM BROMIDE-OLODATEROL 2.5-2.5 MCG/ACT IN AERS: 2.0000 | INHALATION_SPRAY | Freq: Every day | RESPIRATORY_TRACT | 0 refills | Status: DC

## 2017-06-16 MED ORDER — ALBUTEROL SULFATE HFA 108 (90 BASE) MCG/ACT IN AERS: 1.0000 | INHALATION_SPRAY | Freq: Four times a day (QID) | RESPIRATORY_TRACT | 2 refills | Status: DC | PRN

## 2017-06-16 MED ORDER — TIOTROPIUM BROMIDE-OLODATEROL 2.5-2.5 MCG/ACT IN AERS: 2.0000 | INHALATION_SPRAY | Freq: Every day | RESPIRATORY_TRACT | 11 refills | Status: DC

## 2017-06-16 MED ORDER — ALBUTEROL SULFATE 1.25 MG/3ML IN NEBU: 1.0000 | INHALATION_SOLUTION | Freq: Four times a day (QID) | RESPIRATORY_TRACT | 1 refills | Status: DC | PRN

## 2017-06-16 NOTE — Patient Instructions (Addendum)
Plan A = Automatic =   Stiolto  2.5     Take 2 puffs  Each am   Work on inhaler technique:  relax and gently blow all the way out then take a nice smooth deep breath back in, triggering the inhaler at same time you start breathing in.  Hold for up to 5 seconds if you can. Blow out thru nose. Rinse and gargle with water when done   Plan B = Backup Only use your albuterol as a rescue medication to be used if you can't catch your breath by resting or doing a relaxed purse lip breathing pattern.  - The less you use it, the better it will work when you need it. - Ok to use the inhaler up to 2 puffs  every 4 hours if you must but call for appointment if use goes up over your usual need - Don't leave home without it !!  (think of it like the spare tire for your car)   Plan C = Crisis - only use your albuterol nebulizer if you first try Plan B and it fails to help > ok to use the nebulizer up to every 4 hours but if start needing it regularly call for immediate appointment   Please remember to go to the  x-ray department downstairs in the basement  for your tests - we will call you with the results when they are available.  Please schedule a follow up office visit in 6 weeks with PFTs , call sooner if needed with all medications /inhalers/ solutions in hand so we can verify exactly what you are taking. This includes all medications from all doctors and over the counters

## 2017-06-16 NOTE — Progress Notes (Signed)
Subjective:    Patient ID: [name], male    DOB: 11-Dec-1955,    MRN: [id number]   Brief patient profile:  57 yowm with multiple sclerosis quit smoking 1999 with new doe x 2015 and using 02 during the day x 07/2016 and referred to pulmonary clinic 08/24/2016 by Dr [name] with GOLD III copd 09/27/2016    History of Present Illness  08/24/2016 1st [facility] office visit/ [name]   Chief Complaint  Patient presents with  . Pulmonary Consult    Referred by Cameron [place], PA for eval of COPD.  Pt c/o SOB since 2016. He gets winded walking to the mailbox and back and walking up an incline.    sleeps in 45 degrees recliner since 2008 kidney  transplant Breathing always worse the day before HD [facility] = can't walk 100 yards even at a slow pace at a flat grade s stopping due to sob   eg Can do HT but not [employer]  Some nasal congestion but no chest congestion/ no cough except on Sunday evenings  rec Try anoro one click each am take two deep drags to see if helps     09/27/2016  f/u ov/[name] re:  COPD III  Chief Complaint  Patient presents with  . Follow-up    PFT's done today. Breathing is unchanged. No new co's today.   no change doe on Anoro vs off  = MMRC3 as above/ continues better p HD and the worst on Sunday evenings p misses HD over w/e rec You have moderately severe copd but no need for treatment at this point other than to keep your rescue inhaler handy Plan A = Breathe clean air   Plan B = Backup Only use your albuterol as a rescue medication Plan C = Crisis - only use your albuterol nebulizer if you first try Plan B    06/16/2017 extended  f/u ov/[name] re: re establish for copd f/u p [facility] rx / transition of care Chief Complaint  Patient presents with  . Follow-up    Pt c/o cough and SOB since had the flu in April 2018. He had some hemoptysis August 2018. He states he was dxed with Lung CA.    new onset afib/flutter  12/2016 > [facility] eval uncovered nodule on CT chest  But  too sick to bx then developed hemotysis 04/13/17 > FOB 03/07/17 no endobronchial dx/ required sev days vent > 8/918 ebus   Dx sq cell ca >  Finished RT x 5 treatments on 05/10/17   Since RT breathing no worse and continues to cough up mostly  clear phlegm after supper  Comfortable at rest Sleep in recliner minimal tilt  No change in chronic  leg swelling  Breathing always better p HD but still at best [facility] = can't walk 100 yards even at a slow pace at a flat grade s stopping due to sob / some better p saba  No obvious day to day or daytime variability or assoc  purulent sputum or mucus plugs or ongoing  hemoptysis or cp or chest tightness, subjective wheeze or overt sinus  symptoms. No unusual exp hx or h/o childhood pna/ asthma or knowledge of premature birth.  Sleeping ok in recliner without nocturnal  or early am exacerbation  of respiratory  c/o's or need for noct saba. Also denies any obvious fluctuation of symptoms with weather or environmental changes or other aggravating or alleviating factors except as outlined above   Current Allergies,  Complete Past Medical History, Past Surgical History, Family History, and Social History were reviewed in [employer] record.  ROS  The following are not active complaints unless bolded Hoarseness, sore throat, dysphagia, dental problems, itching, sneezing,  nasal congestion or discharge of excess mucus or purulent secretions, ear ache,   fever, chills, sweats, unintended wt loss or wt gain, classically pleuritic or exertional cp,  orthopnea pnd or leg swelling, presyncope, palpitations, abdominal pain, anorexia, nausea, vomiting, diarrhea  or change in bowel habits or change in bladder habits, change in stools or change in urine, dysuria, hematuria,  rash, arthralgias, visual complaints, headache, numbness, weakness or ataxia or problems with walking or coordination,  change in mood/affect or memory.        Current Meds  Medication Sig    . acetaminophen (TYLENOL) 500 MG tablet Take 1,000 mg by mouth every 6 (six) hours as needed for pain or fever.  Marland Kitchen albuterol (PROAIR HFA) 108 (90 Base) MCG/ACT inhaler Inhale 1-2 puffs into the lungs every 6 (six) hours as needed for shortness of breath.  Marland Kitchen atorvastatin (LIPITOR) 40 MG tablet Take 0.5 tablets (20 mg total) by mouth at bedtime.  . calcium acetate (PHOSLO) 667 MG capsule Take 4,002 mg by mouth 3 (three) times daily with meals.   . citalopram (CELEXA) 20 MG tablet Take 20 mg by mouth at bedtime.   . clopidogrel (PLAVIX) 75 MG tablet TAKE 1 TABLET BY MOUTH ONCE (1) DAILY  . docusate sodium (COLACE) 100 MG capsule Take 200 mg by mouth 2 (two) times daily.   . folic acid (FOLVITE) 1 MG tablet Take 1 mg by mouth daily.   Marland Kitchen gabapentin (NEURONTIN) 300 MG capsule Take 300 mg by mouth at bedtime.   Marland Kitchen guaiFENesin (MUCINEX) 600 MG 12 hr tablet Take 1 tablet (600 mg total) by mouth 2 (two) times daily. (Patient taking differently: Take 600 mg by mouth at bedtime. )  . midodrine (PROAMATINE) 10 MG tablet Take 1 tablet (10 mg total) by mouth daily.  . multivitamin (RENA-VIT) TABS tablet Take 1 tablet by mouth daily.  . nitroGLYCERIN (NITROSTAT) 0.4 MG SL tablet Place 0.4 mg under the tongue every 5 (five) minutes as needed for chest pain (max 3 doses - if no relief call MD).   . OXYGEN 2lpm with sleep and "at home"  . oxymetazoline (AFRIN) 0.05 % nasal spray Place 1 spray into both nostrils 2 (two) times daily as needed for congestion.  . pantoprazole (PROTONIX) 40 MG tablet Take 1 tablet (40 mg total) by mouth 2 (two) times daily.  . polyethylene glycol (MIRALAX / GLYCOLAX) packet Take 17 g by mouth at bedtime. Mix in 8 oz liquid and drink  . sodium polystyrene (KAYEXALATE) 15 GM/60ML suspension Take 15 g by mouth See admin instructions. Take 60 ml (15 g) by mouth in the morning on non-dialysis days - Sunday, Tuesday, Thursday, Saturday  . [DISCONTINUED] albuterol (PROAIR HFA) 108 (90 Base)  MCG/ACT inhaler Inhale 1-2 puffs into the lungs every 6 (six) hours as needed for shortness of breath.                             Objective:   Physical Exam  amb chronically ill appearing wm nad   06/16/2017      173  09/27/2016        18 1    08/24/16 184 lb 6.4 oz (83.6 kg)  08/04/16 177  lb 12 oz (80.6 kg)  07/13/16 182 lb 8.7 oz (82.8 kg)    Vital signs reviewed - Note on arrival 02 sats  91% on RA    HEENT: poor dentition, nl turbinates, and oropharynx. Nl external ear canals without cough reflex   NECK :  without JVD/Nodes/TM/ nl carotid upstrokes bilaterally   LUNGS: no acc muscle use,  Very poor air movement both bases but no wheeze localized or otherwise   CV:  IRIR   no s3 or murmur or increase in P2,   Trace  pitting edema L > R   ABD:  soft and nontender with  End insp hoover's in the supine position. No bruits or organomegaly appreciated, bowel sounds nl  MS:  Awkward  gait/ ext warm without deformities, calf tenderness, cyanosis or clubbing No obvious joint restrictions   SKIN: warm and dry without lesions    NEURO:  alert, approp, nl sensorium with  no motor or cerebellar deficits apparent.    CXR PA and Lateral:   06/16/2017 :    I personally reviewed images and agree with radiology impression as follows:    Enlargement of cardiac silhouette.  COPD changes with scattered atelectasis and scarring.  No acute infiltrates.     Assessment & Plan:

## 2017-06-17 DIAGNOSIS — D509 Iron deficiency anemia, unspecified: Secondary | ICD-10-CM | POA: Diagnosis not present

## 2017-06-17 DIAGNOSIS — N186 End stage renal disease: Secondary | ICD-10-CM | POA: Diagnosis not present

## 2017-06-17 DIAGNOSIS — N2581 Secondary hyperparathyroidism of renal origin: Secondary | ICD-10-CM | POA: Diagnosis not present

## 2017-06-17 DIAGNOSIS — Z23 Encounter for immunization: Secondary | ICD-10-CM | POA: Diagnosis not present

## 2017-06-17 DIAGNOSIS — E875 Hyperkalemia: Secondary | ICD-10-CM | POA: Diagnosis not present

## 2017-06-17 NOTE — Assessment & Plan Note (Signed)
Quit smoking 1999 Spirometry 08/24/2016  FEV1 0.48 (22%)  Ratio 49 > trial of anoro one click each am  > no better doe - PFT's  09/27/2016  FEV1 1.12 (32 % ) ratio 50  p 0 % improvement from saba p  nothing prior to study with DLCO  46/47c % corrects to 65  % for alv volume  > rec d/c anoro  - sp RT completed 05/10/17 for sq cell ca without endobronchial dz at fob 03/07/17  - 06/16/2017  After extensive coaching device  effectiveness =    90% with SMI > try stiolto   By symptoms, clearly Pt is Group B in terms of symptom/risk and laba/lama therefore appropriate rx at this point and does seem to benefit from bronchodilators, previously failed anoro, so next step try stiolto and return for pfts in 6 weeks   Obviously his sob not all related to copd but also has marked cm/ CAF/now s/p RT s endobronchial dz to "palliate" per [facility] notes so his lung function is not going to improve from RT but rather expect some worsening.  If not actively followed at [facility] than can return here for regular pulmonary f/u  - if his care is going to be centered there from here on advised better to get him seen by pulmonary at [facility]   I had an extended discussion with the patient reviewing all relevant studies completed to date and  lasting 25 minutes of a 40  minute transition of care office  visit to re -establish     re  severe non-specific but potentially very serious refractory respiratory symptoms of uncertain and potentially multiple  etiologies.   Each maintenance medication was reviewed in detail including most importantly the difference between maintenance and prns and under what circumstances the prns are to be triggered using an action plan format that is not reflected in the computer generated alphabetically organized AVS.    Please see AVS for specific instructions unique to this office visit that I personally wrote and verbalized to the the pt in detail and then reviewed with pt  by my nurse highlighting any changes in  therapy/plan of care  recommended at today's visit.

## 2017-06-19 NOTE — Telephone Encounter (Signed)
I will discuss tomorrow. [name]

## 2017-06-20 ENCOUNTER — Ambulatory Visit (INDEPENDENT_AMBULATORY_CARE_PROVIDER_SITE_OTHER): Payer: Medicare Other | Admitting: Cardiovascular Disease

## 2017-06-20 ENCOUNTER — Encounter: Payer: Self-pay | Admitting: Cardiovascular Disease

## 2017-06-20 VITALS — BP 90/68 | HR 74 | Ht 70.0 in | Wt 168.4 lb

## 2017-06-20 DIAGNOSIS — I359 Nonrheumatic aortic valve disorder, unspecified: Secondary | ICD-10-CM | POA: Diagnosis not present

## 2017-06-20 DIAGNOSIS — N186 End stage renal disease: Secondary | ICD-10-CM

## 2017-06-20 DIAGNOSIS — Z9861 Coronary angioplasty status: Secondary | ICD-10-CM | POA: Diagnosis not present

## 2017-06-20 DIAGNOSIS — D509 Iron deficiency anemia, unspecified: Secondary | ICD-10-CM | POA: Diagnosis not present

## 2017-06-20 DIAGNOSIS — I340 Nonrheumatic mitral (valve) insufficiency: Secondary | ICD-10-CM

## 2017-06-20 DIAGNOSIS — I481 Persistent atrial fibrillation: Secondary | ICD-10-CM

## 2017-06-20 DIAGNOSIS — I5032 Chronic diastolic (congestive) heart failure: Secondary | ICD-10-CM | POA: Diagnosis not present

## 2017-06-20 DIAGNOSIS — N2581 Secondary hyperparathyroidism of renal origin: Secondary | ICD-10-CM | POA: Diagnosis not present

## 2017-06-20 DIAGNOSIS — Z23 Encounter for immunization: Secondary | ICD-10-CM | POA: Diagnosis not present

## 2017-06-20 DIAGNOSIS — I2510 Atherosclerotic heart disease of native coronary artery without angina pectoris: Secondary | ICD-10-CM

## 2017-06-20 DIAGNOSIS — E875 Hyperkalemia: Secondary | ICD-10-CM | POA: Diagnosis not present

## 2017-06-20 DIAGNOSIS — I4819 Other persistent atrial fibrillation: Secondary | ICD-10-CM

## 2017-06-20 NOTE — Progress Notes (Signed)
Chief Complaint  Patient presents with  . Follow-up    atrial fibrillation     History of Present Illness: 61 yo male with history of CAD s/p 2V CABG in 2014 and subsequent PCI procedures, AVR in 2014 with bioprosthetic AVR, ESRD on HD, persistent atrial fibrillation/flutter, multiple sclerosis and sleep apnea who is here today for cardiac follow up. I met him in 2014 after a cardiac arrest during VATS procedure. He was found to have moderate multi-vessel CAD but had worsened symptoms on medical therapy and had 2 V CABG in 2014 along with AVR at [facility]. A bioprosthetic AVR was placed. He had a  NSTEMI in May 2016 at [facility] and had a Resolute DES placed in the Circumflex and a bare metal stent placed in the SVG to LAD. He was admitted to [facility] with an anterior STEMI in June 2017 secondary to acute occlusion of the SVG to LAD treated with a DES. His Plavix had been stopped prior to planned renal transplant. Admitted to [facility] July 2017 with volume overload. Echo July 2017 with normal LVEF, moderate MR, normally functioning bioprosthetic AVR. 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  He has atrial arrhythmias reported for several years, dating back to 2016. He was noted to have SVT and atrial fllutter during admission in 2016. Admitted to [facility] November 2017 and found to be in atrial fib//flutter. He was started on amiodarone and underwent cardioversion with return to sinus. He has been followed in the atrial fib clinic since then. He has continued to have issues with recurrent atrial fib/flutter. Repeat cardioversion March 2018. He had recurrent atrial fib/flutter and ablation was planned by Dr. Rayann Heman but anesthesia did not feel comfortable sedating the patient. He was referred to [facility] EP clinic and ablation was planned but he was found to have a lung cancer and this delayed his ablation. The lung mass was squamous cell carcinoma treated with 5 cycles of XRT (in September).. He was  put on Toprol and midodrine.  He had the atrial flutter ablation on 03/15/17. He was switched from coumadin to Eliquis and Plavix before d/c from [facility]. Following this he was readmitted with GI bleeding and EGD suggested esophagitis. He was discharged again from [facility] on Eliquis and ASA. Readmitted with bleeding 04/13/17 felt to be from his lung cancer. His Eliquis was stopped. He was told to call our office to discuss restarting coumadin. Echo June 2018 at [facility] showed LVEF=55%, ? Mitral stenosis, MAC, mitral regurgitation-unclear how much, moderate RV systolic dysfunction. His wife called our office 3 days ago with questions regarding his anti-coagulation, which has been on hold per [facility] Cardiology. He missed an appt at [facility] on 06/10/17 with Dr. Antonieta Pert. He was not given a repeat follow up appt until December so he asked to be seen in our office.   He tells me that he wants to restart coumadin. He has had no bleeding since August 2018. He has not been on ASA, Plavix or coumadin. He has had issues with his left arm AV fistula. He had no prior bleeding on coumadin. This occurred when he was started on Eliquis at [facility]. The patient denies any chest pain, dyspnea, palpitations, lower extremity edema, orthopnea, PND, dizziness, near syncope or syncope. He did trip and fall this am and suffered cuts on his hands and forehead. He thinks he broke a rib. He did hit his head but has no visual changes, headache, balance disturbance. He tripped  on a fallen limb in his yard. No loss of consciousness.    Primary Care Physician: Algis Greenhouse, MD   Past Medical History:  Diagnosis Date  . Acute blood loss anemia    a. 02/2016 due to groin hematoma. Rec 3 U PRBC.  Marland Kitchen Anemia   . Aortic heart valve prolapse 04/2013   a. s/p bioprosthetic AVR at time of CABG.  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 Bifascicular block   . CAD (coronary artery disease) 04/2013   a. 04/2013: s/p CABG x 2 (Y SVG -LAD & D2). b. 01/2015:  NSTEMI s/p DES to LCx, BMS to SVG-LAD. c. NSTEMI 05/2015 during AF/AFL - non-flow limiting FFR. d. Low risk nuc 3/17. e. STEMI 6/17 after coming off Plavix, s/p DES to SVG-LAD into native vessel.  . Carotid artery disease ([place])    a. 1-39% stenosis bilaterally in 06/2015.   Marland Kitchen Chronic respiratory failure ([place])   . COPD (chronic obstructive pulmonary disease) ([place])   . Dyspnea   . 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 b. He had a renal Tx from 2008 to 2011, but subsequent rejection - Gets HD in [place], [place] on MWF schedule.    . Hematoma    a. Large right groin hematoma after cath 02/2016 with associated ABL anemia.  . Hypertension   . Multiple sclerosis ([place])   . Nocturnal hypoxemia   . On home oxygen therapy    pt states he has not been wearing it  . PAF (paroxysmal atrial fibrillation) ([place])    a. h/o, placed on amiodarone 07/2016 due to recurrence.  . Paroxysmal atrial flutter ([place])    a. During 05/2015 admission - SVT, atrial flutter, and PAF.  Marland Kitchen Peripheral vascular disease ([place])    Cath in 01/2015 required 45 cm [place]  . Renal transplant failure and rejection   . 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 intolerant of bipap.  Marland Kitchen SVT (supraventricular tachycardia) ([place])   . Valvular heart disease    a. 2D Echo 03/25/16: mild LVH, EF 50-55%, grade 2 Dd, AVR present with mild AI, mod MR, severely dilated LA, mildly dilated RV, mod RAE, PASP 72.    Past Surgical History:  Procedure Laterality Date  . AV FISTULA PLACEMENT    . CARDIAC CATHETERIZATION N/A 05/26/2015   Procedure: Left Heart Cath and Coronary Angiography;  Surgeon: Leonie Man, MD;  Location: [place] CV LAB;  Service: Cardiovascular;  Laterality: N/A;  . CARDIAC CATHETERIZATION N/A 05/26/2015   Procedure: Intravascular Pressure Wire/FFR Study;  Surgeon: Leonie Man, MD;  Location: [place] CV LAB;  Service:  Cardiovascular;  Laterality: N/A;  . CARDIAC CATHETERIZATION N/A 02/15/2016   Procedure: Left Heart Cath and Coronary Angiography;  Surgeon: Wellington Hampshire, MD;  Location: [place] CV LAB;  Service: Cardiovascular;  Laterality: N/A;  . CARDIOVERSION N/A 07/12/2016   Procedure: CARDIOVERSION;  Surgeon: Minus Breeding, MD;  Location: [facility] ENDOSCOPY;  Service: Cardiovascular;  Laterality: N/A;  . CARDIOVERSION N/A 11/11/2016   Procedure: CARDIOVERSION;  Surgeon: Fay Records, MD;  Location: [facility] ENDOSCOPY;  Service: Cardiovascular;  Laterality: N/A;  . CARDIOVERSION N/A 12/09/2016   Procedure: CARDIOVERSION;  Surgeon: Sanda Klein, MD;  Location: [facility] ENDOSCOPY;  Service: Cardiovascular;  Laterality: N/A;  . CORONARY ARTERY BYPASS GRAFT    .  excised squamous cells at rectum  2006  . flash     flash pulmonary edema  . HERNIA REPAIR  07/2011  . KIDNEY TRANSPLANT  08/2007  . KNEE SURGERY    . LEFT HEART CATHETERIZATION WITH CORONARY ANGIOGRAM N/A 01/09/2013   Procedure: LEFT HEART CATHETERIZATION WITH CORONARY ANGIOGRAM;  Surgeon: Burnell Blanks, MD;  Location: [facility] CATH LAB;  Service: Cardiovascular;  Laterality: N/A;  . TEE WITHOUT CARDIOVERSION N/A 11/11/2016   Procedure: TRANSESOPHAGEAL ECHOCARDIOGRAM (TEE);  Surgeon: Fay Records, MD;  Location: [place];  Service: Cardiovascular;  Laterality: N/A;  . TEE WITHOUT CARDIOVERSION N/A 12/09/2016   Procedure: TRANSESOPHAGEAL ECHOCARDIOGRAM (TEE);  Surgeon: Sanda Klein, MD;  Location: [facility] ENDOSCOPY;  Service: Cardiovascular;  Laterality: N/A;    Current Outpatient Prescriptions  Medication Sig Dispense Refill  . acetaminophen (TYLENOL) 500 MG tablet Take 1,000 mg by mouth every 6 (six) hours as needed for pain or fever.    Marland Kitchen albuterol (ACCUNEB) 1.25 MG/3ML nebulizer solution Take 3 mLs (1.25 mg total) by nebulization every 6 (six) hours as needed for wheezing. 75 mL 1  . albuterol (PROAIR HFA) 108 (90 Base) MCG/ACT inhaler Inhale 1-2 puffs into  the lungs every 6 (six) hours as needed for shortness of breath. 1 Inhaler 2  . atorvastatin (LIPITOR) 40 MG tablet Take 0.5 tablets (20 mg total) by mouth at bedtime. 30 tablet 9  . calcium acetate (PHOSLO) 667 MG capsule Take 4,002 mg by mouth 3 (three) times daily with meals.     . citalopram (CELEXA) 20 MG tablet Take 20 mg by mouth at bedtime.     . docusate sodium (COLACE) 100 MG capsule Take 200 mg by mouth 2 (two) times daily.     . folic acid (FOLVITE) 1 MG tablet Take 1 mg by mouth daily.     Marland Kitchen gabapentin (NEURONTIN) 300 MG capsule Take 300 mg by mouth at bedtime.     Marland Kitchen guaiFENesin (MUCINEX) 600 MG 12 hr tablet Take 1 tablet (600 mg total) by mouth 2 (two) times daily. (Patient taking differently: Take 600 mg by mouth at bedtime. ) 14 tablet 0  . metoprolol tartrate (LOPRESSOR) 25 MG tablet Take 12.5 mg by mouth 2 (two) times daily.    . midodrine (PROAMATINE) 10 MG tablet Take 10 mg by mouth 3 (three) times daily.    . multivitamin (RENA-VIT) TABS tablet Take 1 tablet by mouth daily. 30 tablet 1  . nitroGLYCERIN (NITROSTAT) 0.4 MG SL tablet Place 0.4 mg under the tongue every 5 (five) minutes as needed for chest pain (max 3 doses - if no relief call MD).     . OXYGEN 2lpm with sleep and "at home"    . oxymetazoline (AFRIN) 0.05 % nasal spray Place 1 spray into both nostrils 2 (two) times daily as needed for congestion.    . pantoprazole (PROTONIX) 40 MG tablet Take 1 tablet (40 mg total) by mouth 2 (two) times daily. 180 tablet 3  . polyethylene glycol (MIRALAX / GLYCOLAX) packet Take 17 g by mouth at bedtime. Mix in 8 oz liquid and drink    . sodium polystyrene (KAYEXALATE) 15 GM/60ML suspension Take 15 g by mouth See admin instructions. Take 60 ml (15 g) by mouth in the morning on non-dialysis days - Sunday, Tuesday, Thursday, Saturday     No current facility-administered medications for this visit.     Allergies  Allergen Reactions  . Diphenhydramine Itching and Anxiety    Only  with IV doses. Tolerates oral.  . Penicillins Other (See Comments)    Migraine Has patient had a PCN reaction causing immediate rash, facial/tongue/throat swelling, SOB or lightheadedness with hypotension: no Has patient had a PCN reaction causing severe rash involving mucus membranes or skin necrosis: No Has patient had a PCN reaction that required hospitalization No Has patient had a PCN reaction occurring within the last 10 years: No If all of the above answers are "NO", then may proceed with Cephalosporin use.  . Bupropion Other (See Comments)    Caused anxiety  . Adhesive [Tape] Rash    Please use paper tape  . Amoxicillin Rash    migraine  . Ativan [Lorazepam] Anxiety    Social History   Social History  . Marital status: Married    Spouse name: N/A  . Number of children: N/A  . Years of education: N/A   Occupational History  . Disabled    Social History Main Topics  . Smoking status: Former Smoker    Packs/day: 2.00    Years: 20.00    Types: Cigarettes    Quit date: 08/06/1998  . Smokeless tobacco: Never Used  . Alcohol use No  . Drug use: No  . Sexual activity: Not on file   Other Topics Concern  . Not on file   Social History Narrative   No history of premature CAD in either parents or siblings. However, his maternal grandfather and 2 maternal uncles had coronary artery disease.    Family History  Problem Relation Age of Onset  . Heart failure Mother        started in 58s  . Emphysema Mother        smoked  . Diabetes Father   . Lupus Sister   . Heart attack Maternal Grandfather   . Heart attack Maternal Uncle   . Heart attack Maternal Aunt   . Hypertension Maternal Aunt   . Stroke Neg Hx     Review of Systems:  As stated in the HPI and otherwise negative.   BP 90/68   Pulse 74   Ht _0  (1.778 m)   Wt 168 lb 6.4 oz (76.4 kg)   SpO2 (!) 85%   BMI 24.16 kg/m   Physical Examination: General: Well developed, well nourished, NAD. His skin is  pale.  HEENT: OP clear, mucus membranes moist  SKIN: warm, dry. No rashes.He has cuts over both hands.  Neuro: No focal deficits  Musculoskeletal: Muscle strength 5/5 all ext  Psychiatric: Mood and affect normal  Neck: No JVD, no carotid bruits, no thyromegaly, no lymphadenopathy.  Lungs:Clear bilaterally, no wheezes, rhonci, crackles Cardiovascular: Irregular, tachy. Systolic murmur.  Abdomen:Soft. Bowel sounds present. Non-tender.  Extremities: No lower extremity edema.   TEE April 2018: Left ventricle: The cavity size was normal. Wall thickness was   increased in a pattern of moderate LVH. There was concentric   hypertrophy. Systolic function was mildly reduced. The estimated   ejection fraction was in the range of 45% to 50%. No evidence of   thrombus. - Aortic valve: A bioprosthesis was present and functioning   normally. There was mild regurgitation. There was no significant   perivalvular regurgitation. - Mitral valve: Severely calcified annulus. Mildly thickened   leaflets . The findings are consistent with trivial stenosis.   There was severe regurgitation directed eccentrically and   posteriorly. Severe regurgitation is suggested by pulmonary vein   systolic flow reversal. - Left atrium: The atrium was  mildly to moderately dilated. No   evidence of thrombus in the atrial cavity or appendage. No   spontaneous echo contrast was observed. - Right ventricle: The cavity size was mildly dilated. - Right atrium: The atrium was mildly dilated. - Atrial septum: No defect or patent foramen ovale was identified. - Tricuspid valve: No evidence of vegetation. There was   regurgitation directed eccentrically and toward the free wall.   There was moderate-severe perivalvular regurgitation.  EKG:  EKG is not  ordered today. The ekg ordered today demonstrates   Recent Labs: 11/16/2016: TSH 3.957 12/05/2016: B Natriuretic Peptide 1,624.4 12/06/2016: ALT 38 12/08/2016: Magnesium  2.4 12/20/2016: Hemoglobin 11.9; Platelets 124 12/21/2016: BUN 44; Creatinine, Ser 6.39; Potassium 5.1; Sodium 139   Lipid Panel    Component Value Date/Time   CHOL 98 (L) 03/05/2016 1125   TRIG 143 03/05/2016 1125   HDL 32 (L) 03/05/2016 1125   CHOLHDL 3.1 03/05/2016 1125   VLDL 29 03/05/2016 1125   LDLCALC 37 03/05/2016 1125     Wt Readings from Last 3 Encounters:  06/20/17 168 lb 6.4 oz (76.4 kg)  06/16/17 173 lb (78.5 kg)  02/09/17 169 lb 6.4 oz (76.8 kg)     Other studies Reviewed: Additional studies/ records that were reviewed today include: . Review of the above records demonstrates:    Assessment and Plan:   1. Atrial fibrillation/flutter,persistent: He has failed atrial flutter ablation at [facility]. He is back in atrial fib/flutter with tachycardia. He is relatively asymptomatic from this. He has been taken off of amiodarone due to his lung disease. He has not been felt to be a good candidate for other anti-arrhythmics per [facility] EP. He was started on Metoprolol tartrate 12.5 mg po BID at [facility] and is tolerating this dose. He is also on midodrine given ongoing hypotension.  He has been off of all anti-coagulation since August 2018. He is high risk for a stroke and he understands this. He is also not on an ASA which would be optimal given h/o AVR. He is not on Plavix which would be optimal given h/o CAD. All of his anti-platelets and anti-coagulants are being held due to bleeding in July and August 2018. He has had no bleeding for the last 60 days. He wishes to try coumadin again for reduction of risk of stroke. I think this would be appropriate. I have discussed this today for over 30 minutes with he and his wife. Given his fall this am, will need to make sure he has no neurological findings before starting coumadin. We will call him back in 4 days and start coumadin if no focal neuro deficits. He does not wish to have a head CT today. He will then be followed in coumadin clinic.  I will  not restart ASA or Plavix at this time. In regards to rate control, will continue metoprolol tartrate. He will be scheduled to see Dr. Rayann Heman again to review any other options for better rate control. Amiodarone is no longer a good choice due to his lung disease. He may not tolerate higher doses of beta blockers. Options for rate control are limited in this complex patient.   2. CAD without angina:  He is s/p CABG in 2014 and several PCI/stent procedures since then, most recently acute anterior MI in June 2017 secondary to thrombotic occlusion of anastomosis of the SVG to LAD, treated with a DES. His MI occurred after stopping Plavix for planning renal transplant.  I would love to  be able to restart ASA and Plavix but I think his bleeding risk is too high since we will be restarting coumadin later this week. If he tolerates the coumadin without bleeding, will consider restarting ASA.     3. Aortic valve disease: He is s/p bioprosthetic AVR in 2014 at [facility]. This has been working well by all recent echos. No changes. Antibiotics before dental visits.    4. Mitral regurgiation: His MR was severe by TEE in April 2018. He is not a candidate for any surgical procedure given his many co-morbidities.    5. Hypotension: His most recent issue has been hypotension. BP is stable. Continue midodrine.   6. ESRD: He is on HD  7. Carotid artery disease: Mild disease by dopplers 2016. Will not plan further testing as he is not a candidate for any surgical procedures.   8. Chronic diastolic CHF: Weight stable. No LE edema. Volume management by HD.   9. GERD: Continue Protonix  Extensive review of records today from multiple hospitalizations over the last 6 months.    Current medicines are reviewed at length with the patient today.  The patient does not have concerns regarding medicines.  The following changes have been made:  no change  Labs/ tests ordered today include:   No orders of the defined types  were placed in this encounter.   30 minutes during visit today reviewing his complex medical history with face to face time with pt and wife.   Disposition:   FU with me in 3   months. He will f/u with DR. Allred in EP clinic in 2-3 weeks.    Signed, Lauree Chandler, MD 06/20/2017 2:29 PM    [address], [place], [place]  [address] Phone: [phone]; Fax: [phone]

## 2017-06-20 NOTE — Patient Instructions (Signed)
Medication Instructions:  Your physician recommends that you continue on your current medications as directed. Please refer to the Current Medication list given to you today.   Labwork: none  Testing/Procedures: none  Follow-Up: You are scheduled to see Dr. Rayann Heman on 07/06/17 at 3:00 You are scheduled to see Dr. Angelena Form on 09/15/17 at12:00  Any Other Special Instructions Will Be Listed Below (If Applicable).  We will call you this Friday about starting coumadin.   If you need a refill on your cardiac medications before your next appointment, please call your pharmacy.

## 2017-06-22 DIAGNOSIS — Z23 Encounter for immunization: Secondary | ICD-10-CM | POA: Diagnosis not present

## 2017-06-22 DIAGNOSIS — N186 End stage renal disease: Secondary | ICD-10-CM | POA: Diagnosis not present

## 2017-06-22 DIAGNOSIS — E875 Hyperkalemia: Secondary | ICD-10-CM | POA: Diagnosis not present

## 2017-06-22 DIAGNOSIS — D509 Iron deficiency anemia, unspecified: Secondary | ICD-10-CM | POA: Diagnosis not present

## 2017-06-22 DIAGNOSIS — N2581 Secondary hyperparathyroidism of renal origin: Secondary | ICD-10-CM | POA: Diagnosis not present

## 2017-06-24 ENCOUNTER — Telehealth: Payer: Self-pay | Admitting: *Deleted

## 2017-06-24 DIAGNOSIS — N2581 Secondary hyperparathyroidism of renal origin: Secondary | ICD-10-CM | POA: Diagnosis not present

## 2017-06-24 DIAGNOSIS — N186 End stage renal disease: Secondary | ICD-10-CM | POA: Diagnosis not present

## 2017-06-24 DIAGNOSIS — D509 Iron deficiency anemia, unspecified: Secondary | ICD-10-CM | POA: Diagnosis not present

## 2017-06-24 DIAGNOSIS — Z23 Encounter for immunization: Secondary | ICD-10-CM | POA: Diagnosis not present

## 2017-06-24 DIAGNOSIS — E875 Hyperkalemia: Secondary | ICD-10-CM | POA: Diagnosis not present

## 2017-06-24 MED ORDER — WARFARIN SODIUM 7.5 MG PO TABS: ORAL_TABLET | ORAL | 0 refills | Status: DC

## 2017-06-24 NOTE — Telephone Encounter (Signed)
I reviewed with Dr. Angelena Form and if pt is not having any dizziness, vision problems,headache or balance issues he can start coumadin.  I discussed with Fuller Canada, PharmD and pt would start previous coumadin dose--3.75 mg daily except on Mondays he should take 7.5 mg.  Clinic visit in one week. I spoke with pt's wife. She reports pt is sore in rib area but having no vision problems, dizziness,headache or balance issues.  I gave her instructions to start coumadin as above.  Pt has tablets at home and does not need new prescription at this time. Appointment made for pt in coumadin clinic on 10/26 at 1:45.

## 2017-06-27 DIAGNOSIS — N2581 Secondary hyperparathyroidism of renal origin: Secondary | ICD-10-CM | POA: Diagnosis not present

## 2017-06-27 DIAGNOSIS — N186 End stage renal disease: Secondary | ICD-10-CM | POA: Diagnosis not present

## 2017-06-27 DIAGNOSIS — E875 Hyperkalemia: Secondary | ICD-10-CM | POA: Diagnosis not present

## 2017-06-27 DIAGNOSIS — D509 Iron deficiency anemia, unspecified: Secondary | ICD-10-CM | POA: Diagnosis not present

## 2017-06-27 DIAGNOSIS — Z23 Encounter for immunization: Secondary | ICD-10-CM | POA: Diagnosis not present

## 2017-06-28 DIAGNOSIS — J449 Chronic obstructive pulmonary disease, unspecified: Secondary | ICD-10-CM | POA: Diagnosis not present

## 2017-06-28 DIAGNOSIS — R0902 Hypoxemia: Secondary | ICD-10-CM | POA: Diagnosis not present

## 2017-06-29 ENCOUNTER — Telehealth: Payer: Self-pay | Admitting: Cardiovascular Disease

## 2017-06-29 DIAGNOSIS — N186 End stage renal disease: Secondary | ICD-10-CM | POA: Diagnosis not present

## 2017-06-29 DIAGNOSIS — D509 Iron deficiency anemia, unspecified: Secondary | ICD-10-CM | POA: Diagnosis not present

## 2017-06-29 DIAGNOSIS — E875 Hyperkalemia: Secondary | ICD-10-CM | POA: Diagnosis not present

## 2017-06-29 DIAGNOSIS — Z23 Encounter for immunization: Secondary | ICD-10-CM | POA: Diagnosis not present

## 2017-06-29 DIAGNOSIS — N2581 Secondary hyperparathyroidism of renal origin: Secondary | ICD-10-CM | POA: Diagnosis not present

## 2017-06-29 NOTE — Telephone Encounter (Signed)
Trazodone can prolong QTc interval as can the citalopram pt is already taking. EKGs from earlier this year all show prolonged QTc > 542msec. Would be very cautious about starting trazodone - at minimum would recommend an EKG at baseline prior to trazodone start to reassess QTc and see if it is safe to start the trazodone.

## 2017-06-29 NOTE — Telephone Encounter (Signed)
New message    Pt c/o medication issue:  1. Name of Medication:  Trazodone hydrochloride  2. How are you currently taking this medication (dosage and times per day)?  Every night   3. Are you having a reaction (difficulty breathing--STAT)? no  4. What is your medication issue? He hasnt started taking it yet,  He wants to verify this will not effect the medication he is taking

## 2017-06-29 NOTE — Telephone Encounter (Signed)
I spoke with pt's wife and gave her information from pharmacist.  She reports it was prescribed to help pt sleep.  I told her she could check with primary care and see if there was something else pt could take to help with sleep.  Pt is seeing Dr. Rayann Heman next week and EKG can be done at this appointment.  Wife reports pt will not take trazodone and will discuss at appointment next week.

## 2017-07-01 ENCOUNTER — Ambulatory Visit (INDEPENDENT_AMBULATORY_CARE_PROVIDER_SITE_OTHER): Payer: Medicare Other | Admitting: Pharmacist

## 2017-07-01 DIAGNOSIS — I4892 Unspecified atrial flutter: Secondary | ICD-10-CM

## 2017-07-01 DIAGNOSIS — I4891 Unspecified atrial fibrillation: Secondary | ICD-10-CM

## 2017-07-01 DIAGNOSIS — I483 Typical atrial flutter: Secondary | ICD-10-CM

## 2017-07-01 DIAGNOSIS — Z5181 Encounter for therapeutic drug level monitoring: Secondary | ICD-10-CM | POA: Diagnosis not present

## 2017-07-01 DIAGNOSIS — Z23 Encounter for immunization: Secondary | ICD-10-CM | POA: Diagnosis not present

## 2017-07-01 DIAGNOSIS — I214 Non-ST elevation (NSTEMI) myocardial infarction: Secondary | ICD-10-CM | POA: Diagnosis not present

## 2017-07-01 DIAGNOSIS — N186 End stage renal disease: Secondary | ICD-10-CM | POA: Diagnosis not present

## 2017-07-01 DIAGNOSIS — Z7901 Long term (current) use of anticoagulants: Secondary | ICD-10-CM

## 2017-07-01 DIAGNOSIS — N2581 Secondary hyperparathyroidism of renal origin: Secondary | ICD-10-CM | POA: Diagnosis not present

## 2017-07-01 DIAGNOSIS — D509 Iron deficiency anemia, unspecified: Secondary | ICD-10-CM | POA: Diagnosis not present

## 2017-07-01 DIAGNOSIS — E875 Hyperkalemia: Secondary | ICD-10-CM | POA: Diagnosis not present

## 2017-07-01 LAB — POCT INR: INR: 2.2

## 2017-07-04 DIAGNOSIS — N186 End stage renal disease: Secondary | ICD-10-CM | POA: Diagnosis not present

## 2017-07-04 DIAGNOSIS — E875 Hyperkalemia: Secondary | ICD-10-CM | POA: Diagnosis not present

## 2017-07-04 DIAGNOSIS — Z23 Encounter for immunization: Secondary | ICD-10-CM | POA: Diagnosis not present

## 2017-07-04 DIAGNOSIS — N2581 Secondary hyperparathyroidism of renal origin: Secondary | ICD-10-CM | POA: Diagnosis not present

## 2017-07-04 DIAGNOSIS — D509 Iron deficiency anemia, unspecified: Secondary | ICD-10-CM | POA: Diagnosis not present

## 2017-07-06 ENCOUNTER — Encounter: Payer: Self-pay | Admitting: Internal Medicine

## 2017-07-06 ENCOUNTER — Ambulatory Visit (INDEPENDENT_AMBULATORY_CARE_PROVIDER_SITE_OTHER): Payer: Medicare Other | Admitting: Internal Medicine

## 2017-07-06 VITALS — BP 84/58 | HR 118 | Ht 70.0 in | Wt 170.2 lb

## 2017-07-06 DIAGNOSIS — Z9861 Coronary angioplasty status: Secondary | ICD-10-CM

## 2017-07-06 DIAGNOSIS — N186 End stage renal disease: Secondary | ICD-10-CM | POA: Diagnosis not present

## 2017-07-06 DIAGNOSIS — N2581 Secondary hyperparathyroidism of renal origin: Secondary | ICD-10-CM | POA: Diagnosis not present

## 2017-07-06 DIAGNOSIS — T8612 Kidney transplant failure: Secondary | ICD-10-CM | POA: Diagnosis not present

## 2017-07-06 DIAGNOSIS — Z992 Dependence on renal dialysis: Secondary | ICD-10-CM | POA: Diagnosis not present

## 2017-07-06 DIAGNOSIS — E875 Hyperkalemia: Secondary | ICD-10-CM | POA: Diagnosis not present

## 2017-07-06 DIAGNOSIS — Z7901 Long term (current) use of anticoagulants: Secondary | ICD-10-CM | POA: Diagnosis not present

## 2017-07-06 DIAGNOSIS — I2510 Atherosclerotic heart disease of native coronary artery without angina pectoris: Secondary | ICD-10-CM | POA: Diagnosis not present

## 2017-07-06 DIAGNOSIS — I483 Typical atrial flutter: Secondary | ICD-10-CM | POA: Diagnosis not present

## 2017-07-06 DIAGNOSIS — I953 Hypotension of hemodialysis: Secondary | ICD-10-CM

## 2017-07-06 DIAGNOSIS — D509 Iron deficiency anemia, unspecified: Secondary | ICD-10-CM | POA: Diagnosis not present

## 2017-07-06 DIAGNOSIS — Z23 Encounter for immunization: Secondary | ICD-10-CM | POA: Diagnosis not present

## 2017-07-06 NOTE — Patient Instructions (Addendum)
Medication Instructions:  Your physician recommends that you continue on your current medications as directed. Please refer to the Current Medication list given to you today.  -- If you need a refill on your cardiac medications before your next appointment, please call your pharmacy. --  Labwork: None ordered  Testing/Procedures: None ordered   Follow-Up: Your physician wants you to follow-up in: 6 months with Dr. Rayann Heman.  You will receive a reminder letter in the mail two months in advance. If you don't receive a letter, please call our office to schedule the follow-up appointment.  Your physician recommends that you schedule an EKG in nurse room on 07/22/17 at 2:00 pm   Thank you for choosing [facility]!!   Frederik Schmidt, RN [phone]  Any Other Special Instructions Will Be Listed Below (If Applicable).

## 2017-07-06 NOTE — Progress Notes (Signed)
PCP: Algis Greenhouse, MD Primary Cardiologist: Dr Angelena Form Primary EP: Dr Rayann Heman  [name] is a 61 y.o. male who presents today for routine electrophysiology followup.  Since I saw him last, he has had a very eventful time at [facility].  He was diagnosed with lung CA an has received treatment.  He had unsuccessful atrial flutter ablation.  He has also had difficulty with GI bleeding.  He is making slow progress.  Unfortunately, he continues to be hypotensive and tachycardic.  He is in atrial flutter today.  Today, he denies symptoms of  chest pain, shortness of breath,  lower extremity edema, dizziness, presyncope, or syncope.  The patient is otherwise without complaint today.   Past Medical History:  Diagnosis Date  . Acute blood loss anemia    a. 02/2016 due to groin hematoma. Rec 3 U PRBC.  Marland Kitchen Anemia   . Aortic heart valve prolapse 04/2013   a. s/p bioprosthetic AVR at time of CABG.  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 Bifascicular block   . CAD (coronary artery disease) 04/2013   a. 04/2013: s/p CABG x 2 (Y SVG -LAD & D2). b. 01/2015: NSTEMI s/p DES to LCx, BMS to SVG-LAD. c. NSTEMI 05/2015 during AF/AFL - non-flow limiting FFR. d. Low risk nuc 3/17. e. STEMI 6/17 after coming off Plavix, s/p DES to SVG-LAD into native vessel.  . Carotid artery disease ([place])    a. 1-39% stenosis bilaterally in 06/2015.   Marland Kitchen Chronic respiratory failure ([place])   . COPD (chronic obstructive pulmonary disease) ([place])   . Dyspnea   . 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 b. He had a renal Tx from 2008 to 2011, but subsequent rejection - Gets HD in [place], [place] on MWF schedule.    . Hematoma    a. Large right groin hematoma after cath 02/2016 with associated ABL anemia.  . Hypertension   . Multiple sclerosis ([place])   . Nocturnal hypoxemia   . On home oxygen therapy    pt states he has not been wearing it  . PAF (paroxysmal atrial fibrillation)  ([place])    a. h/o, placed on amiodarone 07/2016 due to recurrence.  . Paroxysmal atrial flutter ([place])    a. During 05/2015 admission - SVT, atrial flutter, and PAF.  Marland Kitchen Peripheral vascular disease ([place])    Cath in 01/2015 required 45 cm Destination [place]  . Renal transplant failure and rejection   . 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 intolerant of bipap.  Marland Kitchen SVT (supraventricular tachycardia) ([place])   . Valvular heart disease    a. 2D Echo 03/25/16: mild LVH, EF 50-55%, grade 2 Dd, AVR present with mild AI, mod MR, severely dilated LA, mildly dilated RV, mod RAE, PASP 72.   Past Surgical History:  Procedure Laterality Date  . AV FISTULA PLACEMENT    . CARDIAC CATHETERIZATION N/A 05/26/2015   Procedure: Left Heart Cath and Coronary Angiography;  Surgeon: Leonie Man, MD;  Location: [place] CV LAB;  Service: Cardiovascular;  Laterality: N/A;  . CARDIAC CATHETERIZATION N/A 05/26/2015   Procedure: Intravascular Pressure Wire/FFR Study;  Surgeon: Leonie Man, MD;  Location: [place] CV LAB;  Service: Cardiovascular;  Laterality: N/A;  . CARDIAC CATHETERIZATION N/A 02/15/2016   Procedure: Left Heart Cath and Coronary Angiography;  Surgeon: Wellington Hampshire, MD;  Location: [place] CV LAB;  Service: Cardiovascular;  Laterality: N/A;  . CARDIOVERSION N/A 07/12/2016   Procedure: CARDIOVERSION;  Surgeon: Minus Breeding, MD;  Location: [facility] ENDOSCOPY;  Service: Cardiovascular;  Laterality: N/A;  . CARDIOVERSION N/A 11/11/2016   Procedure: CARDIOVERSION;  Surgeon: Fay Records, MD;  Location: [facility] ENDOSCOPY;  Service: Cardiovascular;  Laterality: N/A;  . CARDIOVERSION N/A 12/09/2016   Procedure: CARDIOVERSION;  Surgeon: Sanda Klein, MD;  Location: [facility] ENDOSCOPY;  Service: Cardiovascular;  Laterality: N/A;  . CORONARY ARTERY BYPASS GRAFT    . excised squamous cells at rectum  2006  . flash     flash pulmonary edema  . HERNIA REPAIR  07/2011  . KIDNEY  TRANSPLANT  08/2007  . KNEE SURGERY    . LEFT HEART CATHETERIZATION WITH CORONARY ANGIOGRAM N/A 01/09/2013   Procedure: LEFT HEART CATHETERIZATION WITH CORONARY ANGIOGRAM;  Surgeon: Burnell Blanks, MD;  Location: [facility] CATH LAB;  Service: Cardiovascular;  Laterality: N/A;  . TEE WITHOUT CARDIOVERSION N/A 11/11/2016   Procedure: TRANSESOPHAGEAL ECHOCARDIOGRAM (TEE);  Surgeon: Fay Records, MD;  Location: [place];  Service: Cardiovascular;  Laterality: N/A;  . TEE WITHOUT CARDIOVERSION N/A 12/09/2016   Procedure: TRANSESOPHAGEAL ECHOCARDIOGRAM (TEE);  Surgeon: Sanda Klein, MD;  Location: [facility] ENDOSCOPY;  Service: Cardiovascular;  Laterality: N/A;    ROS- all systems are reviewed and negatives except as per HPI above  Current Outpatient Prescriptions  Medication Sig Dispense Refill  . acetaminophen (TYLENOL) 500 MG tablet Take 1,000 mg by mouth every 6 (six) hours as needed for pain or fever.    Marland Kitchen albuterol (ACCUNEB) 1.25 MG/3ML nebulizer solution Take 3 mLs (1.25 mg total) by nebulization every 6 (six) hours as needed for wheezing. 75 mL 1  . albuterol (PROAIR HFA) 108 (90 Base) MCG/ACT inhaler Inhale 1-2 puffs into the lungs every 6 (six) hours as needed for shortness of breath. 1 Inhaler 2  . atorvastatin (LIPITOR) 40 MG tablet Take 0.5 tablets (20 mg total) by mouth at bedtime. 30 tablet 9  . calcium acetate (PHOSLO) 667 MG capsule Take 4,002 mg by mouth 3 (three) times daily with meals.     . citalopram (CELEXA) 20 MG tablet Take 20 mg by mouth at bedtime.     . docusate sodium (COLACE) 100 MG capsule Take 200 mg by mouth 2 (two) times daily.     . folic acid (FOLVITE) 1 MG tablet Take 1 mg by mouth daily.     Marland Kitchen gabapentin (NEURONTIN) 300 MG capsule Take 300 mg by mouth at bedtime.     . metoprolol tartrate (LOPRESSOR) 25 MG tablet Take 12.5 mg by mouth 2 (two) times daily.    . midodrine (PROAMATINE) 10 MG tablet Take 10 mg by mouth 3 (three) times daily.    . multivitamin (RENA-VIT)  TABS tablet Take 1 tablet by mouth daily. 30 tablet 1  . nitroGLYCERIN (NITROSTAT) 0.4 MG SL tablet Place 0.4 mg under the tongue every 5 (five) minutes as needed for chest pain (max 3 doses - if no relief call MD).     . OXYGEN 2lpm with sleep and "at home"    . oxymetazoline (AFRIN) 0.05 % nasal spray Place 1 spray into both nostrils 2 (two) times daily as needed for congestion.    . pantoprazole (PROTONIX) 40 MG tablet Take 1 tablet (40 mg total) by mouth 2 (two) times daily. 180 tablet 3  . polyethylene glycol (MIRALAX / GLYCOLAX) packet Take  17 g by mouth at bedtime. Mix in 8 oz liquid and drink    . sodium polystyrene (KAYEXALATE) 15 GM/60ML suspension Take 15 g by mouth See admin instructions. Take 60 ml (15 g) by mouth in the morning on non-dialysis days - Sunday, Tuesday, Thursday, Saturday    . tiotropium (SPIRIVA) 18 MCG inhalation capsule Place 18 mcg into inhaler and inhale daily.    Marland Kitchen warfarin (COUMADIN) 7.5 MG tablet 1/2 tablet daily except 1 tablet on Mondays. 30 tablet 0  . guaiFENesin (MUCINEX) 600 MG 12 hr tablet Take 1 tablet (600 mg total) by mouth 2 (two) times daily. (Patient not taking: Reported on 07/06/2017) 14 tablet 0   No current facility-administered medications for this visit.     Physical Exam: Vitals:   07/06/17 1518  BP: (!) 84/58  Pulse: (!) 118  SpO2: 90%  Weight: 170 lb 3.2 oz (77.2 kg)  Height: 5\' 10"  (1.778 m)    GEN- The patient is chronically ill appearing, alert and oriented x 3 today.   Head- normocephalic, atraumatic Eyes-  Sclera clear, conjunctiva pink Ears- hearing intact Oropharynx- clear Lungs- Clear to ausculation bilaterally, normal work of breathing Heart- tachycardic irregular rhythm  GI- soft, NT, ND, + BS Extremities- no clubbing, cyanosis, or edema  EKG tracing ordered today is personally reviewed and shows typical appearing atrial flutter, V rate 118 bpm, PVCs, QTc 498 msec  Assessment and Plan:  1. Atrial  flutter Recurrent post ablation at [place] ability to better rate control is limited by hypotension.  He has scheduled follow-up at [facility]  Continue currently medicines.  Given renal failure and valvular heart disease, I would advise coumadin over NOAC therapy in the future.  2. Hypotension mutifactoral No changes today  3. CAD No ischemic symptoms No changes today  Follow-up with EP at [facility] as scheduled I will see in 6 months unless problems arise  Very complicated patient, at risk for decompensation/ hospitalization.  A high level of decision making was required for this encounter.  Thompson Grayer MD, [facility] 07/06/2017 3:42 PM

## 2017-07-07 ENCOUNTER — Ambulatory Visit (HOSPITAL_COMMUNITY)
Admission: RE | Admit: 2017-07-07 | Discharge: 2017-07-07 | Disposition: A | Discharge status: Home / Self Care | Payer: Medicare Other | Source: Ambulatory Visit | Attending: Vascular Surgery | Admitting: Vascular Surgery

## 2017-07-07 ENCOUNTER — Other Ambulatory Visit: Payer: Self-pay | Admitting: *Deleted

## 2017-07-07 ENCOUNTER — Ambulatory Visit (INDEPENDENT_AMBULATORY_CARE_PROVIDER_SITE_OTHER)
Admission: RE | Admit: 2017-07-07 | Discharge: 2017-07-07 | Disposition: A | Discharge status: Home / Self Care | Payer: Medicare Other | Source: Ambulatory Visit | Attending: Vascular Surgery | Admitting: Vascular Surgery

## 2017-07-07 ENCOUNTER — Encounter: Payer: Self-pay | Admitting: Vascular Surgery

## 2017-07-07 ENCOUNTER — Ambulatory Visit (INDEPENDENT_AMBULATORY_CARE_PROVIDER_SITE_OTHER): Payer: Medicare Other | Admitting: Vascular Surgery

## 2017-07-07 ENCOUNTER — Encounter: Payer: Self-pay | Admitting: *Deleted

## 2017-07-07 VITALS — BP 85/65 | HR 118 | Temp 97.0°F | Resp 16 | Ht 70.0 in | Wt 171.0 lb

## 2017-07-07 DIAGNOSIS — Z992 Dependence on renal dialysis: Secondary | ICD-10-CM | POA: Diagnosis not present

## 2017-07-07 DIAGNOSIS — Z01810 Encounter for preprocedural cardiovascular examination: Secondary | ICD-10-CM | POA: Insufficient documentation

## 2017-07-07 DIAGNOSIS — N186 End stage renal disease: Secondary | ICD-10-CM | POA: Diagnosis not present

## 2017-07-07 DIAGNOSIS — Z9861 Coronary angioplasty status: Secondary | ICD-10-CM

## 2017-07-07 DIAGNOSIS — I2510 Atherosclerotic heart disease of native coronary artery without angina pectoris: Secondary | ICD-10-CM

## 2017-07-07 NOTE — H&P (View-Only) (Signed)
[facility] [place] HISTORY AND PHYSICAL   History of Present Illness:  Patient is a 61 y.o. year old male who presents for placement of a permanent hemodialysis access. The patient is right handed.  The patient is currently on hemodialysis.  He dialyzes Monday Wednesday and Friday at [facility].  He has a history of atrial flutter and is on Coumadin for this. He has had a previous left radiocephalic AV fistula which lasted almost 20 years and failed recently. He currently dialyzes via a right-sided catheter.  Other chronic medical problems include coronary artery disease, COPD, lung cancer which are all stable.  Past Medical History:  Diagnosis Date  . Acute blood loss anemia    a. 02/2016 due to groin hematoma. Rec 3 U PRBC.  Marland Kitchen Anemia   . Aortic heart valve prolapse 04/2013   a. s/p bioprosthetic AVR at time of CABG.  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 Bifascicular block   . CAD (coronary artery disease) 04/2013   a. 04/2013: s/p CABG x 2 (Y SVG -LAD & D2). b. 01/2015: NSTEMI s/p DES to LCx, BMS to SVG-LAD. c. NSTEMI 05/2015 during AF/AFL - non-flow limiting FFR. d. Low risk nuc 3/17. e. STEMI 6/17 after coming off Plavix, s/p DES to SVG-LAD into native vessel.  . Carotid artery disease ([place])    a. 1-39% stenosis bilaterally in 06/2015.   Marland Kitchen Chronic respiratory failure ([place])   . COPD (chronic obstructive pulmonary disease) ([place])   . Dyspnea   . 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 b. He had a renal Tx from 2008 to 2011, but subsequent rejection - Gets HD in [place], [place] on MWF schedule.    . Hematoma    a. Large right groin hematoma after cath 02/2016 with associated ABL anemia.  . Hypertension   . Multiple sclerosis ([place])   . Nocturnal hypoxemia   . On home oxygen therapy    pt states he has not been wearing it  . PAF (paroxysmal atrial fibrillation) ([place])    a. h/o, placed on amiodarone  07/2016 due to recurrence.  . Paroxysmal atrial flutter ([place])    a. During 05/2015 admission - SVT, atrial flutter, and PAF.  Marland Kitchen Peripheral vascular disease ([place])    Cath in 01/2015 required 45 cm Destination Sheath  . Renal transplant failure and rejection   . 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 intolerant of bipap.  Marland Kitchen SVT (supraventricular tachycardia) ([place])   . Valvular heart disease    a. 2D Echo 03/25/16: mild LVH, EF 50-55%, grade 2 Dd, AVR present with mild AI, mod MR, severely dilated LA, mildly dilated RV, mod RAE, PASP 72.    Past Surgical History:  Procedure Laterality Date  . AV FISTULA PLACEMENT    . CARDIAC CATHETERIZATION N/A 05/26/2015   Procedure: Left Heart Cath and Coronary Angiography;  Surgeon: Leonie Man, MD;  Location: [place] CV LAB;  Service: Cardiovascular;  Laterality: N/A;  . CARDIAC CATHETERIZATION N/A 05/26/2015   Procedure: Intravascular Pressure Wire/FFR Study;  Surgeon: Leonie Man, MD;  Location: [place] CV LAB;  Service: Cardiovascular;  Laterality: N/A;  . CARDIAC CATHETERIZATION N/A 02/15/2016   Procedure: Left Heart Cath and Coronary Angiography;  Surgeon: Wellington Hampshire, MD;  Location: [place] CV LAB;  Service: Cardiovascular;  Laterality: N/A;  . CARDIOVERSION N/A 07/12/2016   Procedure: CARDIOVERSION;  Surgeon: Minus Breeding, MD;  Location: [facility] ENDOSCOPY;  Service: Cardiovascular;  Laterality: N/A;  . CARDIOVERSION N/A 11/11/2016   Procedure: CARDIOVERSION;  Surgeon: Fay Records, MD;  Location: [facility] ENDOSCOPY;  Service: Cardiovascular;  Laterality: N/A;  . CARDIOVERSION N/A 12/09/2016   Procedure: CARDIOVERSION;  Surgeon: Sanda Klein, MD;  Location: [facility] ENDOSCOPY;  Service: Cardiovascular;  Laterality: N/A;  . CORONARY ARTERY BYPASS GRAFT    . excised squamous cells at rectum  2006  . flash     flash pulmonary edema  . HERNIA REPAIR  07/2011  . KIDNEY TRANSPLANT  08/2007  . KNEE SURGERY     . LEFT HEART CATHETERIZATION WITH CORONARY ANGIOGRAM N/A 01/09/2013   Procedure: LEFT HEART CATHETERIZATION WITH CORONARY ANGIOGRAM;  Surgeon: Burnell Blanks, MD;  Location: [facility] CATH LAB;  Service: Cardiovascular;  Laterality: N/A;  . TEE WITHOUT CARDIOVERSION N/A 11/11/2016   Procedure: TRANSESOPHAGEAL ECHOCARDIOGRAM (TEE);  Surgeon: Fay Records, MD;  Location: [place];  Service: Cardiovascular;  Laterality: N/A;  . TEE WITHOUT CARDIOVERSION N/A 12/09/2016   Procedure: TRANSESOPHAGEAL ECHOCARDIOGRAM (TEE);  Surgeon: Sanda Klein, MD;  Location: [facility] ENDOSCOPY;  Service: Cardiovascular;  Laterality: N/A;     Social History Social History  Substance Use Topics  . Smoking status: Former Smoker    Packs/day: 2.00    Years: 20.00    Types: Cigarettes    Quit date: 08/06/1998  . Smokeless tobacco: Never Used  . Alcohol use No    Family History Family History  Problem Relation Age of Onset  . Heart failure Mother        started in 55s  . Emphysema Mother        smoked  . Diabetes Father   . Lupus Sister   . Heart attack Maternal Grandfather   . Heart attack Maternal Uncle   . Heart attack Maternal Aunt   . Hypertension Maternal Aunt   . Stroke Neg Hx     Allergies  Allergies  Allergen Reactions  . Diphenhydramine Itching and Anxiety    Only with IV doses. Tolerates oral.  . Penicillins Other (See Comments)    Migraine Has patient had a PCN reaction causing immediate rash, facial/tongue/throat swelling, SOB or lightheadedness with hypotension: no Has patient had a PCN reaction causing severe rash involving mucus membranes or skin necrosis: No Has patient had a PCN reaction that required hospitalization No Has patient had a PCN reaction occurring within the last 10 years: No If all of the above answers are "NO", then may proceed with Cephalosporin use.  . Bupropion Other (See Comments)    Caused anxiety  . Adhesive [Tape] Rash    Please use paper tape  .  Amoxicillin Rash    migraine  . Ativan [Lorazepam] Anxiety     Current Outpatient Prescriptions  Medication Sig Dispense Refill  . acetaminophen (TYLENOL) 500 MG tablet Take 1,000 mg by mouth every 6 (six) hours as needed for pain or fever.    Marland Kitchen albuterol (ACCUNEB) 1.25 MG/3ML nebulizer solution Take 3 mLs (1.25 mg total) by nebulization every 6 (six) hours as needed for wheezing. 75 mL 1  . albuterol (PROAIR HFA) 108 (90 Base) MCG/ACT inhaler Inhale 1-2 puffs into the lungs every 6 (six) hours as needed for shortness of breath. 1 Inhaler 2  . atorvastatin (LIPITOR) 40 MG tablet Take 0.5 tablets (20 mg total) by mouth at bedtime.  30 tablet 9  . calcium acetate (PHOSLO) 667 MG capsule Take 4,002 mg by mouth 3 (three) times daily with meals.     . citalopram (CELEXA) 20 MG tablet Take 20 mg by mouth at bedtime.     . docusate sodium (COLACE) 100 MG capsule Take 200 mg by mouth 2 (two) times daily.     . folic acid (FOLVITE) 1 MG tablet Take 1 mg by mouth daily.     Marland Kitchen gabapentin (NEURONTIN) 300 MG capsule Take 300 mg by mouth at bedtime.     . metoprolol tartrate (LOPRESSOR) 25 MG tablet Take 12.5 mg by mouth 2 (two) times daily.    . midodrine (PROAMATINE) 10 MG tablet Take 10 mg by mouth 3 (three) times daily.    . multivitamin (RENA-VIT) TABS tablet Take 1 tablet by mouth daily. 30 tablet 1  . nitroGLYCERIN (NITROSTAT) 0.4 MG SL tablet Place 0.4 mg under the tongue every 5 (five) minutes as needed for chest pain (max 3 doses - if no relief call MD).     . OXYGEN 2lpm with sleep and "at home"    . oxymetazoline (AFRIN) 0.05 % nasal spray Place 1 spray into both nostrils 2 (two) times daily as needed for congestion.    . pantoprazole (PROTONIX) 40 MG tablet Take 1 tablet (40 mg total) by mouth 2 (two) times daily. 180 tablet 3  . polyethylene glycol (MIRALAX / GLYCOLAX) packet Take 17 g by mouth at bedtime. Mix in 8 oz liquid and drink    . sodium polystyrene (KAYEXALATE) 15 GM/60ML suspension  Take 15 g by mouth See admin instructions. Take 60 ml (15 g) by mouth in the morning on non-dialysis days - Sunday, Tuesday, Thursday, Saturday    . tiotropium (SPIRIVA) 18 MCG inhalation capsule Place 18 mcg into inhaler and inhale daily.    Marland Kitchen warfarin (COUMADIN) 7.5 MG tablet 1/2 tablet daily except 1 tablet on Mondays. 30 tablet 0  . guaiFENesin (MUCINEX) 600 MG 12 hr tablet Take 1 tablet (600 mg total) by mouth 2 (two) times daily. (Patient not taking: Reported on 07/07/2017) 14 tablet 0   No current facility-administered medications for this visit.     ROS:   General:  No weight loss, Fever, chills  HEENT: No recent headaches, no nasal bleeding, no visual changes, no sore throat  Neurologic: No dizziness, blackouts, seizures. No recent symptoms of stroke or mini- stroke. No recent episodes of slurred speech, or temporary blindness.  Cardiac:+ history of atrial fibrillation or irregular heartbeat  Vascular: No history of rest pain in feet.  No history of claudication.  No history of non-healing ulcer, No history of DVT   Pulmonary: + home oxygen, no productive cough, no hemoptysis,  No asthma or wheezing  Musculoskeletal:  [ ]  Arthritis, [ ]  Low back pain,  [ ]  Joint pain  Hematologic:No history of hypercoagulable state.  No history of easy bleeding.  No history of anemia  Gastrointestinal: No hematochezia or melena,  No gastroesophageal reflux, no trouble swallowing  Urinary: [x ] chronic Kidney disease, [x ] on HD - [x ] MWF or [ ]  TTHS, [ ]  Burning with urination, [ ]  Frequent urination, [ ]  Difficulty urinating;   Skin: No rashes  Psychological: + history of anxiety,  No history of depression   Physical Examination  Vitals:   07/07/17 0850  BP: (!) 85/65  Pulse: (!) 118  Resp: 16  Temp: (!) 97 F (36.1 C)  SpO2:  96%  Weight: 171 lb (77.6 kg)  Height: 5\' 10"  (1.778 m)    Body mass index is 24.54 kg/m.  General:  Alert and oriented, no acute distress HEENT:  Normal Neck: No bruit or JVD Pulmonary: Clear to auscultation bilaterally Cardiac: Irregular rhythm without murmur Gastrointestinal: Soft, non-tender, non-distended Skin: No rash Extremity Pulses:   thrombosed tortuous aneurysmal degenerated left radiocephalic AV fistula absent left radial pulse 2+ left brachial right 2+ brachial and radial Musculoskeletal: No deformity or edema  Neurologic: Upper and lower extremity motor 5/5 and symmetric  DATA: Patient had a vein mapping ultrasound today which shows the cephalic vein in the upper arm is around 3 mm in diameter. The basilic vein is 5 mm. In the right arm the cephalic vein is 2 mm in diameter but sclerotic in several areas. There is also some evidence of sclerosis in the left cephalic vein. The basilic vein on the right side is 5 mm. He had a normal upper external arterial duplex.   ASSESSMENT: Patient needs new hemodialysis access. I discussed the patient possibility of basilic vein transposition fistula versus the possibility of using his left upper arm cephalic vein. I believe it is reasonable to do a left brachiocephalic AV fistula. However, he may have some evidence of scar and thrombus in this area. However, he has fairly low pulmonary reserve and would like to avoid more than local with IV sedation if possible. In light of this we will try left brachiocephalic AV fistula. If this fails our fallback plan will be to do a basilic vein transposition fistula.   This is scheduled for 07/19/2017. We will stop his Coumadin 3 days prior.  PLAN:  See above  Ruta Hinds, MD [facility] [place] Office: [phone] Pager: [phone]

## 2017-07-07 NOTE — Progress Notes (Addendum)
[facility] [place] HISTORY AND PHYSICAL   History of Present Illness:  [name] is a 61 y.o. year old male who presents for placement of a permanent hemodialysis access. The [name] is right handed.  The [name] is currently on hemodialysis.  He dialyzes Monday Wednesday and Friday at [facility].  He has a history of atrial flutter and is on Coumadin for this. He has had a previous left radiocephalic AV fistula which lasted almost 20 years and failed recently. He currently dialyzes via a right-sided catheter.  Other chronic medical problems include coronary artery disease, COPD, lung cancer which are all stable.  Past Medical History:  Diagnosis Date  . Acute blood loss anemia    a. 02/2016 due to groin hematoma. Rec 3 U PRBC.  Marland Kitchen Anemia   . Aortic heart valve prolapse 04/2013   a. s/p bioprosthetic AVR at time of CABG.  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 Bifascicular block   . CAD (coronary artery disease) 04/2013   a. 04/2013: s/p CABG x 2 (Y SVG -LAD & D2). b. 01/2015: NSTEMI s/p DES to LCx, BMS to SVG-LAD. c. NSTEMI 05/2015 during AF/AFL - non-flow limiting FFR. d. Low risk nuc 3/17. e. STEMI 6/17 after coming off Plavix, s/p DES to SVG-LAD into native vessel.  . Carotid artery disease ([place])    a. 1-39% stenosis bilaterally in 06/2015.   Marland Kitchen Chronic respiratory failure ([place])   . COPD (chronic obstructive pulmonary disease) ([place])   . Dyspnea   . 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 b. He had a renal Tx from 2008 to 2011, but subsequent rejection - Gets HD in [place], [place] on MWF schedule.    . Hematoma    a. Large right groin hematoma after cath 02/2016 with associated ABL anemia.  . Hypertension   . Multiple sclerosis ([place])   . Nocturnal hypoxemia   . On home oxygen therapy    pt states he has not been wearing it  . PAF (paroxysmal atrial fibrillation) ([place])    a. h/o, placed on amiodarone  07/2016 due to recurrence.  . Paroxysmal atrial flutter ([place])    a. During 05/2015 admission - SVT, atrial flutter, and PAF.  Marland Kitchen Peripheral vascular disease ([place])    Cath in 01/2015 required 45 cm [place]  . Renal transplant failure and rejection   . 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 intolerant of bipap.  Marland Kitchen SVT (supraventricular tachycardia) ([place])   . Valvular heart disease    a. 2D Echo 03/25/16: mild LVH, EF 50-55%, grade 2 Dd, AVR present with mild AI, mod MR, severely dilated LA, mildly dilated RV, mod RAE, PASP 72.    Past Surgical History:  Procedure Laterality Date  . AV FISTULA PLACEMENT    . CARDIAC CATHETERIZATION N/A 05/26/2015   Procedure: Left Heart Cath and Coronary Angiography;  Surgeon: Leonie Man, MD;  Location: [facility] CV LAB;  Service: Cardiovascular;  Laterality: N/A;  . CARDIAC CATHETERIZATION N/A 05/26/2015   Procedure: Intravascular Pressure Wire/FFR Study;  Surgeon: Leonie Man, MD;  Location: [facility] CV LAB;  Service: Cardiovascular;  Laterality: N/A;  . CARDIAC CATHETERIZATION N/A 02/15/2016   Procedure: Left Heart Cath and Coronary Angiography;  Surgeon: Wellington Hampshire, MD;  Location: [facility] CV LAB;  Service: Cardiovascular;  Laterality: N/A;  . CARDIOVERSION N/A 07/12/2016   Procedure: CARDIOVERSION;  Surgeon: Minus Breeding, MD;  Location: [facility] ENDOSCOPY;  Service: Cardiovascular;  Laterality: N/A;  . CARDIOVERSION N/A 11/11/2016   Procedure: CARDIOVERSION;  Surgeon: Fay Records, MD;  Location: [facility] ENDOSCOPY;  Service: Cardiovascular;  Laterality: N/A;  . CARDIOVERSION N/A 12/09/2016   Procedure: CARDIOVERSION;  Surgeon: Sanda Klein, MD;  Location: [facility] ENDOSCOPY;  Service: Cardiovascular;  Laterality: N/A;  . CORONARY ARTERY BYPASS GRAFT    . excised squamous cells at rectum  2006  . flash     flash pulmonary edema  . HERNIA REPAIR  07/2011  . KIDNEY TRANSPLANT  08/2007  . KNEE SURGERY     . LEFT HEART CATHETERIZATION WITH CORONARY ANGIOGRAM N/A 01/09/2013   Procedure: LEFT HEART CATHETERIZATION WITH CORONARY ANGIOGRAM;  Surgeon: Burnell Blanks, MD;  Location[facility] CATH LAB;  Service: Cardiovascular;  Laterality: N/A;  . TEE WITHOUT CARDIOVERSION N/A 11/11/2016   Procedure: TRANSESOPHAGEAL ECHOCARDIOGRAM (TEE);  Surgeon: Fay Records, MD;  Location: [facility];  Service: Cardiovascular;  Laterality: N/A;  . TEE WITHOUT CARDIOVERSION N/A 12/09/2016   Procedure: TRANSESOPHAGEAL ECHOCARDIOGRAM (TEE);  Surgeon: Sanda Klein, MD;  Location: [facility] ENDOSCOPY;  Service: Cardiovascular;  Laterality: N/A;     Social History Social History  Substance Use Topics  . Smoking status: Former Smoker    Packs/day: 2.00    Years: 20.00    Types: Cigarettes    Quit date: 08/06/1998  . Smokeless tobacco: Never Used  . Alcohol use No    Family History Family History  Problem Relation Age of Onset  . Heart failure Mother        started in 53s  . Emphysema Mother        smoked  . Diabetes Father   . Lupus Sister   . Heart attack Maternal Grandfather   . Heart attack Maternal Uncle   . Heart attack Maternal Aunt   . Hypertension Maternal Aunt   . Stroke Neg Hx     Allergies  Allergies  Allergen Reactions  . Diphenhydramine Itching and Anxiety    Only with IV doses. Tolerates oral.  . Penicillins Other (See Comments)    Migraine Has [name] had a PCN reaction causing immediate rash, facial/tongue/throat swelling, SOB or lightheadedness with hypotension: no Has [name] had a PCN reaction causing severe rash involving mucus membranes or skin necrosis: No Has [name] had a PCN reaction that required hospitalization No Has [name] had a PCN reaction occurring within the last 10 years: No If all of the above answers are "NO", then may proceed with Cephalosporin use.  . Bupropion Other (See Comments)    Caused anxiety  . Adhesive [Tape] Rash    Please use paper tape  .  Amoxicillin Rash    migraine  . Ativan [Lorazepam] Anxiety     Current Outpatient Prescriptions  Medication Sig Dispense Refill  . acetaminophen (TYLENOL) 500 MG tablet Take 1,000 mg by mouth every 6 (six) hours as needed for pain or fever.    Marland Kitchen albuterol (ACCUNEB) 1.25 MG/3ML nebulizer solution Take 3 mLs (1.25 mg total) by nebulization every 6 (six) hours as needed for wheezing. 75 mL 1  . albuterol (PROAIR HFA) 108 (90 Base) MCG/ACT inhaler Inhale 1-2 puffs into the lungs every 6 (six) hours as needed for shortness of breath. 1 Inhaler 2  . atorvastatin (LIPITOR) 40 MG tablet Take 0.5 tablets (20 mg total) by mouth at bedtime.  30 tablet 9  . calcium acetate (PHOSLO) 667 MG capsule Take 4,002 mg by mouth 3 (three) times daily with meals.     . citalopram (CELEXA) 20 MG tablet Take 20 mg by mouth at bedtime.     . docusate sodium (COLACE) 100 MG capsule Take 200 mg by mouth 2 (two) times daily.     . folic acid (FOLVITE) 1 MG tablet Take 1 mg by mouth daily.     Marland Kitchen gabapentin (NEURONTIN) 300 MG capsule Take 300 mg by mouth at bedtime.     . metoprolol tartrate (LOPRESSOR) 25 MG tablet Take 12.5 mg by mouth 2 (two) times daily.    . midodrine (PROAMATINE) 10 MG tablet Take 10 mg by mouth 3 (three) times daily.    . multivitamin (RENA-VIT) TABS tablet Take 1 tablet by mouth daily. 30 tablet 1  . nitroGLYCERIN (NITROSTAT) 0.4 MG SL tablet Place 0.4 mg under the tongue every 5 (five) minutes as needed for chest pain (max 3 doses - if no relief call MD).     . OXYGEN 2lpm with sleep and "at home"    . oxymetazoline (AFRIN) 0.05 % nasal spray Place 1 spray into both nostrils 2 (two) times daily as needed for congestion.    . pantoprazole (PROTONIX) 40 MG tablet Take 1 tablet (40 mg total) by mouth 2 (two) times daily. 180 tablet 3  . polyethylene glycol (MIRALAX / GLYCOLAX) packet Take 17 g by mouth at bedtime. Mix in 8 oz liquid and drink    . sodium polystyrene (KAYEXALATE) 15 GM/60ML suspension  Take 15 g by mouth See admin instructions. Take 60 ml (15 g) by mouth in the morning on non-dialysis days - Sunday, Tuesday, Thursday, Saturday    . tiotropium (SPIRIVA) 18 MCG inhalation capsule Place 18 mcg into inhaler and inhale daily.    Marland Kitchen warfarin (COUMADIN) 7.5 MG tablet 1/2 tablet daily except 1 tablet on Mondays. 30 tablet 0  . guaiFENesin (MUCINEX) 600 MG 12 hr tablet Take 1 tablet (600 mg total) by mouth 2 (two) times daily. ([name] not taking: Reported on 07/07/2017) 14 tablet 0   No current facility-administered medications for this visit.     ROS:   General:  No weight loss, Fever, chills  HEENT: No recent headaches, no nasal bleeding, no visual changes, no sore throat  Neurologic: No dizziness, blackouts, seizures. No recent symptoms of stroke or mini- stroke. No recent episodes of slurred speech, or temporary blindness.  Cardiac:+ history of atrial fibrillation or irregular heartbeat  Vascular: No history of rest pain in feet.  No history of claudication.  No history of non-healing ulcer, No history of DVT   Pulmonary: + home oxygen, no productive cough, no hemoptysis,  No asthma or wheezing  Musculoskeletal:  [ ]  Arthritis, [ ]  Low back pain,  [ ]  Joint pain  Hematologic:No history of hypercoagulable state.  No history of easy bleeding.  No history of anemia  Gastrointestinal: No hematochezia or melena,  No gastroesophageal reflux, no trouble swallowing  Urinary: [x ] chronic Kidney disease, [x ] on HD - [x ] MWF or [ ]  TTHS, [ ]  Burning with urination, [ ]  Frequent urination, [ ]  Difficulty urinating;   Skin: No rashes  Psychological: + history of anxiety,  No history of depression   Physical Examination  Vitals:   07/07/17 0850  BP: (!) 85/65  Pulse: (!) 118  Resp: 16  Temp: (!) 97 F (36.1 C)  SpO2:  96%  Weight: 171 lb (77.6 kg)  Height: 5\' 10"  (1.778 m)    Body mass index is 24.54 kg/m.  General:  Alert and oriented, no acute distress HEENT:  Normal Neck: No bruit or JVD Pulmonary: Clear to auscultation bilaterally Cardiac: Irregular rhythm without murmur Gastrointestinal: Soft, non-tender, non-distended Skin: No rash Extremity Pulses:   thrombosed tortuous aneurysmal degenerated left radiocephalic AV fistula absent left radial pulse 2+ left brachial right 2+ brachial and radial Musculoskeletal: No deformity or edema  Neurologic: Upper and lower extremity motor 5/5 and symmetric  DATA: [name] had a vein mapping ultrasound today which shows the cephalic vein in the upper arm is around 3 mm in diameter. The basilic vein is 5 mm. In the right arm the cephalic vein is 2 mm in diameter but sclerotic in several areas. There is also some evidence of sclerosis in the left cephalic vein. The basilic vein on the right side is 5 mm. He had a normal upper external arterial duplex.   ASSESSMENT: [name] needs new hemodialysis access. I discussed the [name] possibility of basilic vein transposition fistula versus the possibility of using his left upper arm cephalic vein. I believe it is reasonable to do a left brachiocephalic AV fistula. However, he may have some evidence of scar and thrombus in this area. However, he has fairly low pulmonary reserve and would like to avoid more than local with IV sedation if possible. In light of this we will try left brachiocephalic AV fistula. If this fails our fallback plan will be to do a basilic vein transposition fistula.   This is scheduled for 07/19/2017. We will stop his Coumadin 3 days prior.  PLAN:  See above  Ruta Hinds, MD [facility] [place] Office: [phone] Pager: [phone]

## 2017-07-08 DIAGNOSIS — N2581 Secondary hyperparathyroidism of renal origin: Secondary | ICD-10-CM | POA: Diagnosis not present

## 2017-07-08 DIAGNOSIS — E875 Hyperkalemia: Secondary | ICD-10-CM | POA: Diagnosis not present

## 2017-07-08 DIAGNOSIS — D509 Iron deficiency anemia, unspecified: Secondary | ICD-10-CM | POA: Diagnosis not present

## 2017-07-08 DIAGNOSIS — N186 End stage renal disease: Secondary | ICD-10-CM | POA: Diagnosis not present

## 2017-07-11 DIAGNOSIS — D509 Iron deficiency anemia, unspecified: Secondary | ICD-10-CM | POA: Diagnosis not present

## 2017-07-11 DIAGNOSIS — E875 Hyperkalemia: Secondary | ICD-10-CM | POA: Diagnosis not present

## 2017-07-11 DIAGNOSIS — N2581 Secondary hyperparathyroidism of renal origin: Secondary | ICD-10-CM | POA: Diagnosis not present

## 2017-07-11 DIAGNOSIS — N186 End stage renal disease: Secondary | ICD-10-CM | POA: Diagnosis not present

## 2017-07-13 DIAGNOSIS — D509 Iron deficiency anemia, unspecified: Secondary | ICD-10-CM | POA: Diagnosis not present

## 2017-07-13 DIAGNOSIS — N186 End stage renal disease: Secondary | ICD-10-CM | POA: Diagnosis not present

## 2017-07-13 DIAGNOSIS — N2581 Secondary hyperparathyroidism of renal origin: Secondary | ICD-10-CM | POA: Diagnosis not present

## 2017-07-13 DIAGNOSIS — E875 Hyperkalemia: Secondary | ICD-10-CM | POA: Diagnosis not present

## 2017-07-15 DIAGNOSIS — E875 Hyperkalemia: Secondary | ICD-10-CM | POA: Diagnosis not present

## 2017-07-15 DIAGNOSIS — N2581 Secondary hyperparathyroidism of renal origin: Secondary | ICD-10-CM | POA: Diagnosis not present

## 2017-07-15 DIAGNOSIS — N186 End stage renal disease: Secondary | ICD-10-CM | POA: Diagnosis not present

## 2017-07-15 DIAGNOSIS — D509 Iron deficiency anemia, unspecified: Secondary | ICD-10-CM | POA: Diagnosis not present

## 2017-07-18 ENCOUNTER — Encounter (HOSPITAL_COMMUNITY): Payer: Self-pay | Admitting: *Deleted

## 2017-07-18 DIAGNOSIS — N2581 Secondary hyperparathyroidism of renal origin: Secondary | ICD-10-CM | POA: Diagnosis not present

## 2017-07-18 DIAGNOSIS — E875 Hyperkalemia: Secondary | ICD-10-CM | POA: Diagnosis not present

## 2017-07-18 DIAGNOSIS — D509 Iron deficiency anemia, unspecified: Secondary | ICD-10-CM | POA: Diagnosis not present

## 2017-07-18 DIAGNOSIS — N186 End stage renal disease: Secondary | ICD-10-CM | POA: Diagnosis not present

## 2017-07-18 NOTE — Progress Notes (Signed)
Spoke with pt's wife, Caryl Asp for pre-op call. Pt was in room with her. Pt has hx of CAD with CABG and stents. Pt denies any recent chest pain or sob. Pt has chronic respiratory failure and in on O2 at 2 L all the time. She states pt is not diabetic. Last dose of Coumadin was 07/16/17 per Dr. Oneida Alar' instruction. Chart sent to Anesthesiology PA/NP for review.

## 2017-07-18 NOTE — Progress Notes (Signed)
Anesthesia Chart Review:  Patient is a same day work up  Patient is a 61 year old male scheduled for L brachiocephalic AV fistula creation on 07/19/2017 with Ruta Hinds, M.D.  - PCP is Teressa Lower, MD (notes in care everywhere)  - Pulmonologist is Christinia Gully, MD   - Cardiologist is Lauree Chandler, MD - EP cardiologist is Thompson Grayer, MD - EP cardiologist at [facility] is Burnett Sheng, MD (notes in care everywhere)   - Receiving radiation for lung cancer at [facility] includes: CAD (s/p CABG x2 2014; DES to [place], BMS to SVG-LAD 01/2015; DES to SVG-LAD into native vessel 02/2016), cardiac arrest (after VATS 2014), aortic valve prolapse (s/p bioprosthetic AVR at time of CABG 2014), persistent atrial fibrillation, severe mitral regurgitation (not a candidate for repair due to co-morbidities), SVT, CHF, HTN, lung cancer, OSA, COPD, chronic respiratory failure, multiple sclerosis, ESRD on hemodialysis (s/p failed/rejected renal transplant), GERD. Former smoker. Uses home oxygen.   Medications include: Albuterol, Lipitor, metoprolol, Midodrine, Protonix, stiolto respimat, coumadin. Last dose coumadin 07/16/17  Labs will be obtained today surgery.  CXR 06/16/17:  - Enlargement of cardiac silhouette. - COPD changes with scattered atelectasis and scarring. - No acute infiltrates.  EKG 07/06/17: atrial flutter, V rate 118 bpm, PVCs, QTc 498 msec  TEE 12/09/16:  - Left ventricle: The cavity size was normal. Wall thickness was increased in a pattern of moderate LVH. There was concentric hypertrophy. Systolic function was mildly reduced. The estimated ejection fraction was in the range of 45% to 50%. No evidence of thrombus. - Aortic valve: A bioprosthesis was present and functioning normally. There was mild regurgitation. There was no significant perivalvular regurgitation. - Mitral valve: Severely calcified annulus. Mildly thickened leaflet. The findings are consistent with trivial stenosis.  There was severe regurgitation directed eccentrically and posteriorly. Severe regurgitation is suggested by pulmonary vein systolic flow reversal. - Left atrium: The atrium was mildly to moderately dilated. No evidence of thrombus in the atrial cavity or appendage. No spontaneous echo contrast was observed. - Right ventricle: The cavity size was mildly dilated. - Right atrium: The atrium was mildly dilated. - Atrial septum: No defect or patent foramen ovale was identified. - Tricuspid valve: No evidence of vegetation. There was regurgitation directed eccentrically and toward the free wall. There was moderate-severe perivalvular regurgitation. - Impressions: Successful cardioversion. No cardiac source of emboli was indentified.  Cardiac cath 02/15/16:   Mid LAD lesion, 100% stenosed.  Ost 2nd Diag lesion, 99% stenosed.  Ost LAD lesion, 40% stenosed.  Mid Cx lesion, 65% stenosed. The lesion was previously treated with a drug-eluting stent between one and five months ago. May 2016  Sequential SVG was injected is large. Very large (greater than 6 mm)  This is a Y graft that goes to D2 and LAD.  Sequential SVG was injected is large. Very large  Y graft to both D2 and LAD  Mid Graft lesion, 50% stenosed.  Prox Graft to Mid Graft lesion, 10% stenosed. The lesion was previously treated with a bare metal stent between one and five months ago. May 2016  Origin lesion, 40% stenosed.  Prox RCA lesion, 60% stenosed.  Dist Graft to Insertion lesion, 100% stenosed. Post intervention, there is a 0% residual stenosis. 1. Severe one-vessel coronary artery disease with acute occlusion of SVG to LAD at the anastomosis. Patent left circumflex stent with moderate restenosis. Moderate RCA disease. Patent Y graft to diagonal part with moderate ostial stenosis. 2. Successful angioplasty and drug-eluting  stent placement to the anastomosis of SVG to LAD extending into the native vessel.  Carotid duplex  06/13/15:  - Heterogeneous plaque, bilaterally. - 1-39% bilateral ICA stenosis. - Normal subclavian arteries, bilaterally. - Patent vertebral arteries with antegrade flow  Pt with significant CV, pulmonary, cancer history.  Per Dr. Camillia Herter notes, pt not a candidate for MR repair or carotid surgery due to co-morbidities.  Pt evaluated by EP cardiology, cardiology, and pulmonology in last month and was stable at those times.  Pt will need further assessment by assigned anesthesiologist day of surgery.   Willeen Cass, FNP-BC [facility]/Anesthesiology Phone: [phone] 07/18/2017 4:31 PM

## 2017-07-19 ENCOUNTER — Ambulatory Visit (HOSPITAL_COMMUNITY): Payer: Medicare Other | Admitting: Emergency Medicine

## 2017-07-19 ENCOUNTER — Encounter (HOSPITAL_COMMUNITY)
Admission: RE | Disposition: A | Discharge status: Home / Self Care | Payer: Self-pay | Source: Ambulatory Visit | Attending: Vascular Surgery

## 2017-07-19 ENCOUNTER — Ambulatory Visit (HOSPITAL_COMMUNITY)
Admission: RE | Admit: 2017-07-19 | Discharge: 2017-07-19 | Disposition: A | Discharge status: Home / Self Care | Payer: Medicare Other | Source: Ambulatory Visit | Attending: Vascular Surgery | Admitting: Vascular Surgery

## 2017-07-19 ENCOUNTER — Other Ambulatory Visit: Payer: Self-pay

## 2017-07-19 ENCOUNTER — Encounter (HOSPITAL_COMMUNITY): Payer: Self-pay

## 2017-07-19 DIAGNOSIS — Z888 Allergy status to other drugs, medicaments and biological substances status: Secondary | ICD-10-CM | POA: Diagnosis not present

## 2017-07-19 DIAGNOSIS — I252 Old myocardial infarction: Secondary | ICD-10-CM | POA: Insufficient documentation

## 2017-07-19 DIAGNOSIS — I6523 Occlusion and stenosis of bilateral carotid arteries: Secondary | ICD-10-CM | POA: Insufficient documentation

## 2017-07-19 DIAGNOSIS — I471 Supraventricular tachycardia: Secondary | ICD-10-CM | POA: Insufficient documentation

## 2017-07-19 DIAGNOSIS — Z87891 Personal history of nicotine dependence: Secondary | ICD-10-CM | POA: Diagnosis not present

## 2017-07-19 DIAGNOSIS — I480 Paroxysmal atrial fibrillation: Secondary | ICD-10-CM | POA: Diagnosis not present

## 2017-07-19 DIAGNOSIS — I132 Hypertensive heart and chronic kidney disease with heart failure and with stage 5 chronic kidney disease, or end stage renal disease: Secondary | ICD-10-CM | POA: Diagnosis not present

## 2017-07-19 DIAGNOSIS — C3490 Malignant neoplasm of unspecified part of unspecified bronchus or lung: Secondary | ICD-10-CM | POA: Insufficient documentation

## 2017-07-19 DIAGNOSIS — G35 Multiple sclerosis: Secondary | ICD-10-CM | POA: Diagnosis not present

## 2017-07-19 DIAGNOSIS — I4892 Unspecified atrial flutter: Secondary | ICD-10-CM | POA: Diagnosis not present

## 2017-07-19 DIAGNOSIS — Z7901 Long term (current) use of anticoagulants: Secondary | ICD-10-CM | POA: Diagnosis not present

## 2017-07-19 DIAGNOSIS — Z79899 Other long term (current) drug therapy: Secondary | ICD-10-CM | POA: Insufficient documentation

## 2017-07-19 DIAGNOSIS — Z992 Dependence on renal dialysis: Secondary | ICD-10-CM | POA: Diagnosis not present

## 2017-07-19 DIAGNOSIS — Z9889 Other specified postprocedural states: Secondary | ICD-10-CM | POA: Diagnosis not present

## 2017-07-19 DIAGNOSIS — Z91048 Other nonmedicinal substance allergy status: Secondary | ICD-10-CM | POA: Diagnosis not present

## 2017-07-19 DIAGNOSIS — I120 Hypertensive chronic kidney disease with stage 5 chronic kidney disease or end stage renal disease: Secondary | ICD-10-CM | POA: Insufficient documentation

## 2017-07-19 DIAGNOSIS — I5032 Chronic diastolic (congestive) heart failure: Secondary | ICD-10-CM | POA: Diagnosis not present

## 2017-07-19 DIAGNOSIS — Z953 Presence of xenogenic heart valve: Secondary | ICD-10-CM | POA: Diagnosis not present

## 2017-07-19 DIAGNOSIS — I739 Peripheral vascular disease, unspecified: Secondary | ICD-10-CM | POA: Insufficient documentation

## 2017-07-19 DIAGNOSIS — G4730 Sleep apnea, unspecified: Secondary | ICD-10-CM | POA: Insufficient documentation

## 2017-07-19 DIAGNOSIS — J449 Chronic obstructive pulmonary disease, unspecified: Secondary | ICD-10-CM | POA: Diagnosis not present

## 2017-07-19 DIAGNOSIS — Z880 Allergy status to penicillin: Secondary | ICD-10-CM | POA: Insufficient documentation

## 2017-07-19 DIAGNOSIS — Z8269 Family history of other diseases of the musculoskeletal system and connective tissue: Secondary | ICD-10-CM | POA: Insufficient documentation

## 2017-07-19 DIAGNOSIS — Z9981 Dependence on supplemental oxygen: Secondary | ICD-10-CM | POA: Diagnosis not present

## 2017-07-19 DIAGNOSIS — N185 Chronic kidney disease, stage 5: Secondary | ICD-10-CM | POA: Diagnosis not present

## 2017-07-19 DIAGNOSIS — N186 End stage renal disease: Secondary | ICD-10-CM | POA: Insufficient documentation

## 2017-07-19 DIAGNOSIS — Z940 Kidney transplant status: Secondary | ICD-10-CM | POA: Diagnosis not present

## 2017-07-19 DIAGNOSIS — I2510 Atherosclerotic heart disease of native coronary artery without angina pectoris: Secondary | ICD-10-CM | POA: Insufficient documentation

## 2017-07-19 HISTORY — DX: Acute myocardial infarction, unspecified: I21.9

## 2017-07-19 HISTORY — DX: Heart failure, unspecified: I50.9

## 2017-07-19 HISTORY — DX: Pneumonia, unspecified organism: J18.9

## 2017-07-19 HISTORY — DX: Malignant (primary) neoplasm, unspecified: C80.1

## 2017-07-19 HISTORY — DX: Anxiety disorder, unspecified: F41.9

## 2017-07-19 HISTORY — PX: AV FISTULA PLACEMENT: SHX1204

## 2017-07-19 HISTORY — DX: Gastro-esophageal reflux disease without esophagitis: K21.9

## 2017-07-19 LAB — APTT: APTT: 37 s — AB (ref 24–36)

## 2017-07-19 LAB — PROTIME-INR
INR: 1.85
Prothrombin Time: 21.2 seconds — ABNORMAL HIGH (ref 11.4–15.2)

## 2017-07-19 LAB — POCT I-STAT 4, (NA,K, GLUC, HGB,HCT)
GLUCOSE: 93 mg/dL (ref 65–99)
HEMATOCRIT: 37 % — AB (ref 39.0–52.0)
HEMOGLOBIN: 12.6 g/dL — AB (ref 13.0–17.0)
POTASSIUM: 4.3 mmol/L (ref 3.5–5.1)
Sodium: 143 mmol/L (ref 135–145)

## 2017-07-19 SURGERY — ARTERIOVENOUS (AV) FISTULA CREATION
Anesthesia: Monitor Anesthesia Care | Site: Arm Upper | Laterality: Left

## 2017-07-19 MED ORDER — 0.9 % SODIUM CHLORIDE (POUR BTL) OPTIME
TOPICAL | Status: DC | PRN
Start: 2017-07-19 — End: 2017-07-19
  Administered 2017-07-19: 1000 mL

## 2017-07-19 MED ORDER — ONDANSETRON HCL 4 MG/2ML IJ SOLN
INTRAMUSCULAR | Status: DC | PRN
  Administered 2017-07-19: 4 mg via INTRAVENOUS

## 2017-07-19 MED ORDER — SODIUM CHLORIDE 0.9 % IV SOLN
INTRAVENOUS | Status: DC | PRN
  Administered 2017-07-19: 08:00:00

## 2017-07-19 MED ORDER — SODIUM CHLORIDE 0.9 % IV SOLN
INTRAVENOUS | Status: DC
  Administered 2017-07-19 (×2): via INTRAVENOUS

## 2017-07-19 MED ORDER — LIDOCAINE HCL (PF) 1 % IJ SOLN
INTRAMUSCULAR | Status: AC
  Filled 2017-07-19: qty 30

## 2017-07-19 MED ORDER — LIDOCAINE HCL (PF) 1 % IJ SOLN
INTRAMUSCULAR | Status: DC | PRN
  Administered 2017-07-19: 10 mL

## 2017-07-19 MED ORDER — FENTANYL CITRATE (PF) 250 MCG/5ML IJ SOLN
INTRAMUSCULAR | Status: AC
  Filled 2017-07-19: qty 5

## 2017-07-19 MED ORDER — LIDOCAINE HCL (CARDIAC) 20 MG/ML IV SOLN
INTRAVENOUS | Status: DC | PRN
  Administered 2017-07-19: 40 mg via INTRATRACHEAL

## 2017-07-19 MED ORDER — CHLORHEXIDINE GLUCONATE 4 % EX LIQD: 60.0000 mL | Freq: Once | CUTANEOUS | Status: DC

## 2017-07-19 MED ORDER — PHENYLEPHRINE 40 MCG/ML (10ML) SYRINGE FOR IV PUSH (FOR BLOOD PRESSURE SUPPORT)
PREFILLED_SYRINGE | INTRAVENOUS | Status: AC
  Filled 2017-07-19: qty 10

## 2017-07-19 MED ORDER — HEPARIN SODIUM (PORCINE) 1000 UNIT/ML IJ SOLN
INTRAMUSCULAR | Status: DC | PRN
  Administered 2017-07-19: 3000 [IU] via INTRAVENOUS

## 2017-07-19 MED ORDER — WARFARIN SODIUM 7.5 MG PO TABS: 3.7500 mg | ORAL_TABLET | ORAL | Status: DC

## 2017-07-19 MED ORDER — OXYCODONE HCL 5 MG PO TABS: 5.0000 mg | ORAL_TABLET | Freq: Four times a day (QID) | ORAL | 0 refills | Status: AC | PRN

## 2017-07-19 MED ORDER — PHENYLEPHRINE HCL 10 MG/ML IJ SOLN
INTRAMUSCULAR | Status: DC | PRN
  Administered 2017-07-19 (×3): 120 ug via INTRAVENOUS
  Administered 2017-07-19: 80 ug via INTRAVENOUS
  Administered 2017-07-19: 120 ug via INTRAVENOUS

## 2017-07-19 MED ORDER — PROPOFOL 10 MG/ML IV BOLUS
INTRAVENOUS | Status: AC
  Filled 2017-07-19: qty 20

## 2017-07-19 MED ORDER — PROPOFOL 500 MG/50ML IV EMUL
INTRAVENOUS | Status: DC | PRN
  Administered 2017-07-19: 25 ug/kg/min via INTRAVENOUS

## 2017-07-19 MED ORDER — MIDAZOLAM HCL 2 MG/2ML IJ SOLN
INTRAMUSCULAR | Status: AC
  Filled 2017-07-19: qty 2

## 2017-07-19 MED ORDER — ALBUTEROL SULFATE HFA 108 (90 BASE) MCG/ACT IN AERS: 2.0000 | INHALATION_SPRAY | Freq: Four times a day (QID) | RESPIRATORY_TRACT | Status: AC | PRN

## 2017-07-19 MED ORDER — VANCOMYCIN HCL IN DEXTROSE 1-5 GM/200ML-% IV SOLN
1000.0000 mg | INTRAVENOUS | Status: AC
  Administered 2017-07-19: 1000 mg via INTRAVENOUS
  Filled 2017-07-19: qty 200

## 2017-07-19 MED ORDER — LIDOCAINE 2% (20 MG/ML) 5 ML SYRINGE
INTRAMUSCULAR | Status: AC
  Filled 2017-07-19: qty 5

## 2017-07-19 SURGICAL SUPPLY — 30 items
ADH SKN CLS APL DERMABOND .7 (GAUZE/BANDAGES/DRESSINGS) ×1
ARMBAND PINK RESTRICT EXTREMIT (MISCELLANEOUS) ×4 IMPLANT
CANISTER SUCT 3000ML PPV (MISCELLANEOUS) ×2 IMPLANT
CANNULA VESSEL 3MM 2 BLNT TIP (CANNULA) ×2 IMPLANT
CLIP VESOCCLUDE MED 6/CT (CLIP) ×2 IMPLANT
CLIP VESOCCLUDE SM WIDE 6/CT (CLIP) ×2 IMPLANT
COVER PROBE W GEL 5X96 (DRAPES) IMPLANT
DECANTER SPIKE VIAL GLASS SM (MISCELLANEOUS) ×2 IMPLANT
DERMABOND ADVANCED (GAUZE/BANDAGES/DRESSINGS) ×1
DERMABOND ADVANCED .7 DNX12 (GAUZE/BANDAGES/DRESSINGS) ×1 IMPLANT
DRAIN PENROSE 1/4X12 LTX STRL (WOUND CARE) ×2 IMPLANT
ELECT REM PT RETURN 9FT ADLT (ELECTROSURGICAL) ×2
ELECTRODE REM PT RTRN 9FT ADLT (ELECTROSURGICAL) ×1 IMPLANT
GLOVE BIO SURGEON STRL SZ7.5 (GLOVE) ×2 IMPLANT
GOWN STRL REUS W/ TWL LRG LVL3 (GOWN DISPOSABLE) ×3 IMPLANT
GOWN STRL REUS W/TWL LRG LVL3 (GOWN DISPOSABLE) ×6
KIT BASIN OR (CUSTOM PROCEDURE TRAY) ×2 IMPLANT
KIT ROOM TURNOVER OR (KITS) ×2 IMPLANT
LOOP VESSEL MINI RED (MISCELLANEOUS) IMPLANT
NS IRRIG 1000ML POUR BTL (IV SOLUTION) ×2 IMPLANT
PACK CV ACCESS (CUSTOM PROCEDURE TRAY) ×2 IMPLANT
PAD ARMBOARD 7.5X6 YLW CONV (MISCELLANEOUS) ×4 IMPLANT
SPONGE SURGIFOAM ABS GEL 100 (HEMOSTASIS) IMPLANT
SUT PROLENE 6 0 BV (SUTURE) ×3 IMPLANT
SUT PROLENE 7 0 BV 1 (SUTURE) ×2 IMPLANT
SUT VIC AB 3-0 SH 27 (SUTURE) ×4
SUT VIC AB 3-0 SH 27X BRD (SUTURE) ×1 IMPLANT
SUT VICRYL 4-0 PS2 18IN ABS (SUTURE) ×2 IMPLANT
UNDERPAD 30X30 (UNDERPADS AND DIAPERS) ×2 IMPLANT
WATER STERILE IRR 1000ML POUR (IV SOLUTION) ×2 IMPLANT

## 2017-07-19 NOTE — Interval H&P Note (Signed)
History and Physical Interval Note:  07/19/2017 7:25 AM  [name]  has presented today for surgery, with the diagnosis of end stage renal disease  The various methods of treatment have been discussed with the patient and family. After consideration of risks, benefits and other options for treatment, the patient has consented to  Procedure(s): ARTERIOVENOUS (AV) FISTULA CREATION BRACHIOCEPHALIC (Left) as a surgical intervention .  The patient's history has been reviewed, patient examined, no change in status, stable for surgery.  I have reviewed the patient's chart and labs.  Questions were answered to the patient's satisfaction.     Ruta Hinds

## 2017-07-19 NOTE — Transfer of Care (Signed)
Immediate Anesthesia Transfer of Care Note  Patient: [name]  Procedure(s) Performed: LEFT BRACHIOCEPHALIC ARTERIOVENOUS FISTULA CREATION (Left Arm Upper)  Patient Location: PACU  Anesthesia Type:MAC  Level of Consciousness: awake, alert  and oriented  Airway & Oxygen Therapy: Patient Spontanous Breathing and Patient connected to nasal cannula oxygen  Post-op Assessment: Report given to RN and Post -op Vital signs reviewed and stable  Post vital signs: Reviewed and stable  Last Vitals:  Vitals:   07/19/17 0558 07/19/17 0603  BP: 126/75   Pulse: (!) 119   Resp: 18   Temp: 36.4 C   SpO2:  95%    Last Pain:  Vitals:   07/19/17 0558  TempSrc: Oral         Complications: No apparent anesthesia complications

## 2017-07-19 NOTE — Discharge Instructions (Signed)
° °  [facility]  Discharge Instructions Wear oxygen for next 24 hours per DR SInger(anesthesia) AV Fistula or Graft Surgery for Dialysis Access  Please refer to the following instructions for your post-procedure care. Your surgeon or physician assistant will discuss any changes with you.  Activity  You may drive the day following your surgery, if you are comfortable and no longer taking prescription pain medication. Resume full activity as the soreness in your incision resolves.  Bathing/Showering  You may shower after you go home. Keep your incision dry for 48 hours. Do not soak in a bathtub, hot tub, or swim until the incision heals completely. You may not shower if you have a hemodialysis catheter.  Incision Care  Clean your incision with mild soap and water after 48 hours. Pat the area dry with a clean towel. You do not need a bandage unless otherwise instructed. Do not apply any ointments or creams to your incision. You may have skin glue on your incision. Do not peel it off. It will come off on its own in about one week. Your arm may swell a bit after surgery. To reduce swelling use pillows to elevate your arm so it is above your heart. Your doctor will tell you if you need to lightly wrap your arm with an ACE bandage.  Diet  Resume your normal diet. There are not special food restrictions following this procedure. In order to heal from your surgery, it is CRITICAL to get adequate nutrition. Your body requires vitamins, minerals, and protein. Vegetables are the best source of vitamins and minerals. Vegetables also provide the perfect balance of protein. Processed food has little nutritional value, so try to avoid this.  Medications  Resume taking all of your medications. If your incision is causing pain, you may take over-the counter pain relievers such as acetaminophen (Tylenol). If you were prescribed a stronger pain medication, please be aware these  medications can cause nausea and constipation. Prevent nausea by taking the medication with a snack or meal. Avoid constipation by drinking plenty of fluids and eating foods with high amount of fiber, such as fruits, vegetables, and grains. Do not take Tylenol if you are taking prescription pain medications.  Follow up Your surgeon may want to see you in the office following your access surgery. If so, this will be arranged at the time of your surgery.  Please call us immediately for any of the following conditions:  Increased pain, redness, drainage (pus) from your incision site Fever of 101 degrees or higher Severe or worsening pain at your incision site Hand pain or numbness.  Reduce your risk of vascular disease:  Stop smoking. If you would like help, call [facility] at 1-800-QUIT-NOW (817)482-6462) or [place] [facility] [place] your cholesterol Maintain a desired weight Control your diabetes Keep your blood pressure down  Dialysis  It will take several weeks to several months for your new dialysis access to be ready for use. Your surgeon will determine when it is OK to use it. Your nephrologist will continue to direct your dialysis. You can continue to use your Permcath until your new access is ready for use.   07/19/2017 [name] [id number] [birth year]  Surgeon(s): Fields, Jessy Oto, MD  Procedure(s): LEFT BRACHIOCEPHALIC ARTERIOVENOUS FISTULA CREATION  x Do not stick fistula for 12 weeks    If you have any questions, please call the office at [phone].

## 2017-07-19 NOTE — Op Note (Signed)
Procedure: Left Brachial Cephalic AV fistula  Preop: ESRD  Postop: ESRD  Anesthesia: General  Assistant: Leontine Locket, PA-C  Findings: 4 mm cephalic vein  Procedure: After obtaining informed consent, the patient was taken to the operating room.  After adequate sedation, the left upper extremity was prepped and draped in usual sterile fashion.  A transverse incision was then made near the antecubital crease the left arm. The incision was carried into the subcutaneous tissues down to level of the cephalic vein. The cephalic vein was approximately 4 mm in diameter. It was of good quality. This was dissected free circumferentially and small side branches ligated and divided between silk ties or clips. Next the brachial artery was dissected free in the medial portion of the incision. The artery was  4 mm in diameter. The vessel loops were placed proximal and distal to the planned site of arteriotomy. The patient was given 3000 units of intravenous heparin. After appropriate circulation time, the vessel loops were used to control the artery. A longitudinal opening was made in the brachial artery.  The vein was ligated distally with a 2-0 silk tie. The vein was controlled proximally with a fine bulldog clamp. The vein was then swung over to the artery and sewn end of vein to side of artery using a running 6-0 Prolene suture. Just prior to completion of the anastomosis, everything was fore bled back bled and thoroughly flushed. The anastomosis was secured, vessel loops released, and there was a palpable thrill in the fistula immediately. After hemostasis was obtained, the subcutaneous tissues were reapproximated using a running 3-0 Vicryl suture. The skin was then closed with a 4 0 Vicryl subcuticular stitch. Dermabond was applied to the skin incision.    Cameron Hinds, MD [facility] [place] Office: [phone] Pager: [phone]

## 2017-07-19 NOTE — Anesthesia Postprocedure Evaluation (Signed)
Anesthesia Post Note  Patient: Caisen Mangas  Procedure(s) Performed: LEFT BRACHIOCEPHALIC ARTERIOVENOUS FISTULA CREATION (Left Arm Upper)     Patient location during evaluation: PACU Anesthesia Type: MAC Level of consciousness: awake and alert Pain management: pain level controlled Vital Signs Assessment: post-procedure vital signs reviewed and stable Respiratory status: spontaneous breathing and respiratory function stable Cardiovascular status: stable Postop Assessment: no apparent nausea or vomiting Anesthetic complications: no Comments: SaO2 marginal, pt instructed to use home O2 24 hours post op.    Last Vitals:  Vitals:   07/19/17 0945 07/19/17 0946  BP:  (!) 88/62  Pulse: (!) 115 (!) 115  Resp: 17 14  Temp: (!) 36.1 C   SpO2: 91% 92%    Last Pain:  Vitals:   07/19/17 0558  TempSrc: Oral                 Nataki Mccrumb DANIEL

## 2017-07-19 NOTE — Anesthesia Preprocedure Evaluation (Addendum)
Anesthesia Evaluation  Patient identified by MRN, date of birth, ID band Patient awake    Reviewed: Allergy & Precautions, H&P , NPO status , Patient's Chart, lab work & pertinent test results  History of Anesthesia Complications Negative for: history of anesthetic complications  Airway Mallampati: II  TM Distance: >3 FB Neck ROM: Full    Dental no notable dental hx. (+) Poor Dentition, Dental Advisory Given   Pulmonary sleep apnea , COPD,  oxygen dependent, former smoker,    Pulmonary exam normal        Cardiovascular Exercise Tolerance: Good hypertension, Pt. on medications + CAD, + Past MI, + CABG, + Peripheral Vascular Disease and +CHF  + dysrhythmias Atrial Fibrillation  Rhythm:Regular Rate:Tachycardia + Systolic murmurs Study Conclusions  - Left ventricle: The cavity size was normal. Wall thickness was   increased in a pattern of moderate LVH. There was concentric   hypertrophy. Systolic function was mildly reduced. The estimated   ejection fraction was in the range of 45% to 50%. No evidence of   thrombus. - Aortic valve: A bioprosthesis was present and functioning   normally. There was mild regurgitation. There was no significant   perivalvular regurgitation. - Mitral valve: Severely calcified annulus. Mildly thickened   leaflets . The findings are consistent with trivial stenosis.   There was severe regurgitation directed eccentrically and   posteriorly. Severe regurgitation is suggested by pulmonary vein   systolic flow reversal. - Left atrium: The atrium was mildly to moderately dilated. No   evidence of thrombus in the atrial cavity or appendage. No   spontaneous echo contrast was observed. - Right ventricle: The cavity size was mildly dilated. - Right atrium: The atrium was mildly dilated.   Neuro/Psych PSYCHIATRIC DISORDERS Anxiety Depression negative neurological ROS     GI/Hepatic negative GI ROS, Neg  liver ROS,   Endo/Other  negative endocrine ROS  Renal/GU ESRF and DialysisRenal diseasenegative Renal ROS  negative genitourinary   Musculoskeletal   Abdominal   Peds  Hematology negative hematology ROS (+) anemia ,   Anesthesia Other Findings   Reproductive/Obstetrics negative OB ROS                          Anesthesia Physical  Anesthesia Plan  ASA: III  Anesthesia Plan: MAC   Post-op Pain Management:    Induction: Intravenous  PONV Risk Score and Plan: 2 and Ondansetron and Propofol infusion  Airway Management Planned: Simple Face Mask and Natural Airway  Additional Equipment:   Intra-op Plan:   Post-operative Plan:   Informed Consent: I have reviewed the patients History and Physical, chart, labs and discussed the procedure including the risks, benefits and alternatives for the proposed anesthesia with the patient or authorized representative who has indicated his/her understanding and acceptance.   Dental advisory given  Plan Discussed with: Anesthesiologist  Anesthesia Plan Comments:        Anesthesia Quick Evaluation

## 2017-07-19 NOTE — Progress Notes (Signed)
SPoke with Dr singer about oxygen levels, states pt needs to be on O2 for 24 hours, Pt understands this and is ready to go home.

## 2017-07-20 ENCOUNTER — Encounter (HOSPITAL_COMMUNITY): Payer: Self-pay | Admitting: Vascular Surgery

## 2017-07-20 ENCOUNTER — Telehealth: Payer: Self-pay | Admitting: Vascular Surgery

## 2017-07-20 DIAGNOSIS — N186 End stage renal disease: Secondary | ICD-10-CM | POA: Diagnosis not present

## 2017-07-20 DIAGNOSIS — E875 Hyperkalemia: Secondary | ICD-10-CM | POA: Diagnosis not present

## 2017-07-20 DIAGNOSIS — D509 Iron deficiency anemia, unspecified: Secondary | ICD-10-CM | POA: Diagnosis not present

## 2017-07-20 DIAGNOSIS — N2581 Secondary hyperparathyroidism of renal origin: Secondary | ICD-10-CM | POA: Diagnosis not present

## 2017-07-20 NOTE — Telephone Encounter (Signed)
Sched appt 08/25/17; lab at 1:00 and MD at 1:45. Spoke to pt.

## 2017-07-20 NOTE — Telephone Encounter (Signed)
-----   Message from Mena Goes, RN sent at 07/19/2017  9:12 AM EST ----- Regarding: 4-6 Weeks w/ duplex   ----- Message ----- From: Gabriel Earing, PA-C Sent: 07/19/2017   9:01 AM To: [facility] Charge Pool  S/p left BC AVF 07/19/17.  F/u with Dr. Oneida Alar in 4-6 weeks with duplex.  Thanks

## 2017-07-22 ENCOUNTER — Ambulatory Visit (INDEPENDENT_AMBULATORY_CARE_PROVIDER_SITE_OTHER): Payer: Medicare Other | Admitting: *Deleted

## 2017-07-22 VITALS — HR 121 | Ht 70.0 in | Wt 170.0 lb

## 2017-07-22 DIAGNOSIS — I4891 Unspecified atrial fibrillation: Secondary | ICD-10-CM | POA: Diagnosis not present

## 2017-07-22 DIAGNOSIS — I483 Typical atrial flutter: Secondary | ICD-10-CM

## 2017-07-22 DIAGNOSIS — Z5181 Encounter for therapeutic drug level monitoring: Secondary | ICD-10-CM

## 2017-07-22 DIAGNOSIS — I4892 Unspecified atrial flutter: Secondary | ICD-10-CM

## 2017-07-22 DIAGNOSIS — I214 Non-ST elevation (NSTEMI) myocardial infarction: Secondary | ICD-10-CM | POA: Diagnosis not present

## 2017-07-22 DIAGNOSIS — D509 Iron deficiency anemia, unspecified: Secondary | ICD-10-CM | POA: Diagnosis not present

## 2017-07-22 DIAGNOSIS — N2581 Secondary hyperparathyroidism of renal origin: Secondary | ICD-10-CM | POA: Diagnosis not present

## 2017-07-22 DIAGNOSIS — N186 End stage renal disease: Secondary | ICD-10-CM | POA: Diagnosis not present

## 2017-07-22 DIAGNOSIS — E875 Hyperkalemia: Secondary | ICD-10-CM | POA: Diagnosis not present

## 2017-07-22 DIAGNOSIS — Z7901 Long term (current) use of anticoagulants: Secondary | ICD-10-CM

## 2017-07-22 LAB — POCT INR: INR: 1.8

## 2017-07-22 NOTE — Patient Instructions (Signed)
Continue taking 1/2 tablet (3.75mg ) daily except 1 tablet on Mondays.  Recheck in 2 weeks.  Call us with any new medications # [phone] [facility] # [phone]

## 2017-07-25 ENCOUNTER — Encounter: Payer: Self-pay | Admitting: *Deleted

## 2017-07-25 ENCOUNTER — Telehealth: Payer: Self-pay | Admitting: Cardiovascular Disease

## 2017-07-25 DIAGNOSIS — E875 Hyperkalemia: Secondary | ICD-10-CM | POA: Diagnosis not present

## 2017-07-25 DIAGNOSIS — D509 Iron deficiency anemia, unspecified: Secondary | ICD-10-CM | POA: Diagnosis not present

## 2017-07-25 DIAGNOSIS — N2581 Secondary hyperparathyroidism of renal origin: Secondary | ICD-10-CM | POA: Diagnosis not present

## 2017-07-25 DIAGNOSIS — N186 End stage renal disease: Secondary | ICD-10-CM | POA: Diagnosis not present

## 2017-07-25 NOTE — Progress Notes (Signed)
Patient here on 07/22/17 for a follow up EKG per Dr Rayann Heman.  He is currently taking Trazodone 100 mg every night.    Dr Caryl Comes reviewed.  His HR still elevated at 121.   QTc is better.  He has no complaints today.  Will review with Dr Rayann Heman and call him back with recommendations if any.  Allred reviewed and no changes at present.patient aware.

## 2017-07-25 NOTE — Telephone Encounter (Signed)
New Message  Pt wife call requesting to speak with RN. She states since having fistula procedure pt has been complaining of dizziness. Please call back to discuss

## 2017-07-25 NOTE — Telephone Encounter (Signed)
Agree. Thanks

## 2017-07-25 NOTE — Telephone Encounter (Signed)
I spoke with pt. He reports dizziness which he first noticed after fistula placed on 07/19/17.  BP is always low and is stable. Systolic recently has been 90-150.  Heart rate OK.  Dizziness is not related to change in position. Comes and goes. Will sometimes notice it when walking down the hallway. Has to sit down. Has not passed out. Trazodone was started by primary care about 2-3 weeks ago and about a week ago was increased.  I asked pt to follow up with primary care regarding possible causes of dizziness

## 2017-07-27 DIAGNOSIS — N2581 Secondary hyperparathyroidism of renal origin: Secondary | ICD-10-CM | POA: Diagnosis not present

## 2017-07-27 DIAGNOSIS — D509 Iron deficiency anemia, unspecified: Secondary | ICD-10-CM | POA: Diagnosis not present

## 2017-07-27 DIAGNOSIS — N186 End stage renal disease: Secondary | ICD-10-CM | POA: Diagnosis not present

## 2017-07-27 DIAGNOSIS — E875 Hyperkalemia: Secondary | ICD-10-CM | POA: Diagnosis not present

## 2017-07-29 DIAGNOSIS — E875 Hyperkalemia: Secondary | ICD-10-CM | POA: Diagnosis not present

## 2017-07-29 DIAGNOSIS — N186 End stage renal disease: Secondary | ICD-10-CM | POA: Diagnosis not present

## 2017-07-29 DIAGNOSIS — N2581 Secondary hyperparathyroidism of renal origin: Secondary | ICD-10-CM | POA: Diagnosis not present

## 2017-07-29 DIAGNOSIS — D509 Iron deficiency anemia, unspecified: Secondary | ICD-10-CM | POA: Diagnosis not present

## 2017-08-01 ENCOUNTER — Emergency Department (HOSPITAL_COMMUNITY): Payer: Medicare Other

## 2017-08-01 ENCOUNTER — Inpatient Hospital Stay (HOSPITAL_COMMUNITY): Payer: Medicare Other

## 2017-08-01 ENCOUNTER — Inpatient Hospital Stay (HOSPITAL_COMMUNITY)
Admission: EM | Admit: 2017-08-01 | Discharge: 2017-08-13 | DRG: 252 | Disposition: A | Discharge status: Home / Self Care | Payer: Medicare Other | Attending: Internal Medicine | Admitting: Internal Medicine

## 2017-08-01 ENCOUNTER — Other Ambulatory Visit: Payer: Self-pay

## 2017-08-01 ENCOUNTER — Encounter (HOSPITAL_COMMUNITY): Payer: Self-pay

## 2017-08-01 DIAGNOSIS — L814 Other melanin hyperpigmentation: Secondary | ICD-10-CM

## 2017-08-01 DIAGNOSIS — Z66 Do not resuscitate: Secondary | ICD-10-CM | POA: Diagnosis present

## 2017-08-01 DIAGNOSIS — I2781 Cor pulmonale (chronic): Secondary | ICD-10-CM | POA: Diagnosis present

## 2017-08-01 DIAGNOSIS — I483 Typical atrial flutter: Secondary | ICD-10-CM | POA: Diagnosis present

## 2017-08-01 DIAGNOSIS — I5043 Acute on chronic combined systolic (congestive) and diastolic (congestive) heart failure: Secondary | ICD-10-CM | POA: Diagnosis present

## 2017-08-01 DIAGNOSIS — I2510 Atherosclerotic heart disease of native coronary artery without angina pectoris: Secondary | ICD-10-CM

## 2017-08-01 DIAGNOSIS — Z515 Encounter for palliative care: Secondary | ICD-10-CM

## 2017-08-01 DIAGNOSIS — R0602 Shortness of breath: Secondary | ICD-10-CM | POA: Diagnosis not present

## 2017-08-01 DIAGNOSIS — I25700 Atherosclerosis of coronary artery bypass graft(s), unspecified, with unstable angina pectoris: Secondary | ICD-10-CM | POA: Diagnosis not present

## 2017-08-01 DIAGNOSIS — J449 Chronic obstructive pulmonary disease, unspecified: Secondary | ICD-10-CM | POA: Diagnosis present

## 2017-08-01 DIAGNOSIS — R011 Cardiac murmur, unspecified: Secondary | ICD-10-CM | POA: Diagnosis not present

## 2017-08-01 DIAGNOSIS — Z953 Presence of xenogenic heart valve: Secondary | ICD-10-CM

## 2017-08-01 DIAGNOSIS — I481 Persistent atrial fibrillation: Secondary | ICD-10-CM | POA: Diagnosis present

## 2017-08-01 DIAGNOSIS — E2740 Unspecified adrenocortical insufficiency: Secondary | ICD-10-CM | POA: Diagnosis present

## 2017-08-01 DIAGNOSIS — Z91048 Other nonmedicinal substance allergy status: Secondary | ICD-10-CM

## 2017-08-01 DIAGNOSIS — Z8249 Family history of ischemic heart disease and other diseases of the circulatory system: Secondary | ICD-10-CM

## 2017-08-01 DIAGNOSIS — I5022 Chronic systolic (congestive) heart failure: Secondary | ICD-10-CM | POA: Diagnosis not present

## 2017-08-01 DIAGNOSIS — E8889 Other specified metabolic disorders: Secondary | ICD-10-CM | POA: Diagnosis present

## 2017-08-01 DIAGNOSIS — I132 Hypertensive heart and chronic kidney disease with heart failure and with stage 5 chronic kidney disease, or end stage renal disease: Secondary | ICD-10-CM | POA: Diagnosis not present

## 2017-08-01 DIAGNOSIS — I482 Chronic atrial fibrillation: Secondary | ICD-10-CM

## 2017-08-01 DIAGNOSIS — I9589 Other hypotension: Secondary | ICD-10-CM | POA: Diagnosis not present

## 2017-08-01 DIAGNOSIS — Z880 Allergy status to penicillin: Secondary | ICD-10-CM

## 2017-08-01 DIAGNOSIS — I5020 Unspecified systolic (congestive) heart failure: Secondary | ICD-10-CM | POA: Diagnosis not present

## 2017-08-01 DIAGNOSIS — L8990 Pressure ulcer of unspecified site, unspecified stage: Secondary | ICD-10-CM

## 2017-08-01 DIAGNOSIS — R579 Shock, unspecified: Secondary | ICD-10-CM | POA: Diagnosis not present

## 2017-08-01 DIAGNOSIS — G35 Multiple sclerosis: Secondary | ICD-10-CM | POA: Diagnosis present

## 2017-08-01 DIAGNOSIS — N186 End stage renal disease: Secondary | ICD-10-CM

## 2017-08-01 DIAGNOSIS — I429 Cardiomyopathy, unspecified: Secondary | ICD-10-CM | POA: Diagnosis present

## 2017-08-01 DIAGNOSIS — Z992 Dependence on renal dialysis: Secondary | ICD-10-CM | POA: Diagnosis not present

## 2017-08-01 DIAGNOSIS — D631 Anemia in chronic kidney disease: Secondary | ICD-10-CM | POA: Diagnosis not present

## 2017-08-01 DIAGNOSIS — Z9861 Coronary angioplasty status: Secondary | ICD-10-CM

## 2017-08-01 DIAGNOSIS — I959 Hypotension, unspecified: Secondary | ICD-10-CM | POA: Diagnosis not present

## 2017-08-01 DIAGNOSIS — I272 Pulmonary hypertension, unspecified: Secondary | ICD-10-CM | POA: Diagnosis not present

## 2017-08-01 DIAGNOSIS — E8770 Fluid overload, unspecified: Secondary | ICD-10-CM | POA: Diagnosis not present

## 2017-08-01 DIAGNOSIS — K219 Gastro-esophageal reflux disease without esophagitis: Secondary | ICD-10-CM | POA: Diagnosis present

## 2017-08-01 DIAGNOSIS — I5032 Chronic diastolic (congestive) heart failure: Secondary | ICD-10-CM | POA: Diagnosis present

## 2017-08-01 DIAGNOSIS — R791 Abnormal coagulation profile: Secondary | ICD-10-CM

## 2017-08-01 DIAGNOSIS — I480 Paroxysmal atrial fibrillation: Secondary | ICD-10-CM | POA: Diagnosis present

## 2017-08-01 DIAGNOSIS — Z87891 Personal history of nicotine dependence: Secondary | ICD-10-CM

## 2017-08-01 DIAGNOSIS — I739 Peripheral vascular disease, unspecified: Secondary | ICD-10-CM

## 2017-08-01 DIAGNOSIS — Z9981 Dependence on supplemental oxygen: Secondary | ICD-10-CM | POA: Diagnosis not present

## 2017-08-01 DIAGNOSIS — Z836 Family history of other diseases of the respiratory system: Secondary | ICD-10-CM

## 2017-08-01 DIAGNOSIS — L02212 Cutaneous abscess of back [any part, except buttock]: Secondary | ICD-10-CM | POA: Diagnosis not present

## 2017-08-01 DIAGNOSIS — Z940 Kidney transplant status: Secondary | ICD-10-CM | POA: Diagnosis not present

## 2017-08-01 DIAGNOSIS — I4892 Unspecified atrial flutter: Secondary | ICD-10-CM | POA: Diagnosis not present

## 2017-08-01 DIAGNOSIS — Z79899 Other long term (current) drug therapy: Secondary | ICD-10-CM

## 2017-08-01 DIAGNOSIS — I953 Hypotension of hemodialysis: Secondary | ICD-10-CM | POA: Diagnosis not present

## 2017-08-01 DIAGNOSIS — J9611 Chronic respiratory failure with hypoxia: Secondary | ICD-10-CM | POA: Diagnosis present

## 2017-08-01 DIAGNOSIS — I951 Orthostatic hypotension: Secondary | ICD-10-CM | POA: Diagnosis not present

## 2017-08-01 DIAGNOSIS — I472 Ventricular tachycardia: Secondary | ICD-10-CM | POA: Diagnosis not present

## 2017-08-01 DIAGNOSIS — I252 Old myocardial infarction: Secondary | ICD-10-CM

## 2017-08-01 DIAGNOSIS — T8612 Kidney transplant failure: Secondary | ICD-10-CM | POA: Diagnosis not present

## 2017-08-01 DIAGNOSIS — Z833 Family history of diabetes mellitus: Secondary | ICD-10-CM

## 2017-08-01 DIAGNOSIS — Z85118 Personal history of other malignant neoplasm of bronchus and lung: Secondary | ICD-10-CM

## 2017-08-01 DIAGNOSIS — G4730 Sleep apnea, unspecified: Secondary | ICD-10-CM | POA: Diagnosis present

## 2017-08-01 DIAGNOSIS — I770 Arteriovenous fistula, acquired: Secondary | ICD-10-CM

## 2017-08-01 DIAGNOSIS — L03114 Cellulitis of left upper limb: Secondary | ICD-10-CM | POA: Diagnosis present

## 2017-08-01 DIAGNOSIS — E875 Hyperkalemia: Secondary | ICD-10-CM | POA: Diagnosis not present

## 2017-08-01 DIAGNOSIS — R0601 Orthopnea: Secondary | ICD-10-CM

## 2017-08-01 DIAGNOSIS — T82898A Other specified complication of vascular prosthetic devices, implants and grafts, initial encounter: Secondary | ICD-10-CM | POA: Diagnosis not present

## 2017-08-01 DIAGNOSIS — N2581 Secondary hyperparathyroidism of renal origin: Secondary | ICD-10-CM | POA: Diagnosis present

## 2017-08-01 DIAGNOSIS — R000 Tachycardia, unspecified: Secondary | ICD-10-CM | POA: Diagnosis not present

## 2017-08-01 DIAGNOSIS — I340 Nonrheumatic mitral (valve) insufficiency: Secondary | ICD-10-CM | POA: Diagnosis not present

## 2017-08-01 DIAGNOSIS — I25810 Atherosclerosis of coronary artery bypass graft(s) without angina pectoris: Secondary | ICD-10-CM | POA: Diagnosis present

## 2017-08-01 DIAGNOSIS — R0609 Other forms of dyspnea: Secondary | ICD-10-CM

## 2017-08-01 DIAGNOSIS — I4819 Other persistent atrial fibrillation: Secondary | ICD-10-CM

## 2017-08-01 DIAGNOSIS — C3490 Malignant neoplasm of unspecified part of unspecified bronchus or lung: Secondary | ICD-10-CM

## 2017-08-01 DIAGNOSIS — Z923 Personal history of irradiation: Secondary | ICD-10-CM

## 2017-08-01 DIAGNOSIS — R55 Syncope and collapse: Secondary | ICD-10-CM

## 2017-08-01 DIAGNOSIS — I2729 Other secondary pulmonary hypertension: Secondary | ICD-10-CM | POA: Diagnosis present

## 2017-08-01 DIAGNOSIS — Z888 Allergy status to other drugs, medicaments and biological substances status: Secondary | ICD-10-CM

## 2017-08-01 DIAGNOSIS — I509 Heart failure, unspecified: Secondary | ICD-10-CM | POA: Diagnosis not present

## 2017-08-01 DIAGNOSIS — I4891 Unspecified atrial fibrillation: Secondary | ICD-10-CM | POA: Diagnosis not present

## 2017-08-01 DIAGNOSIS — Z952 Presence of prosthetic heart valve: Secondary | ICD-10-CM

## 2017-08-01 DIAGNOSIS — Z8349 Family history of other endocrine, nutritional and metabolic diseases: Secondary | ICD-10-CM

## 2017-08-01 DIAGNOSIS — Z881 Allergy status to other antibiotic agents status: Secondary | ICD-10-CM

## 2017-08-01 DIAGNOSIS — Z954 Presence of other heart-valve replacement: Secondary | ICD-10-CM

## 2017-08-01 DIAGNOSIS — T8611 Kidney transplant rejection: Secondary | ICD-10-CM

## 2017-08-01 DIAGNOSIS — Z7901 Long term (current) use of anticoagulants: Secondary | ICD-10-CM

## 2017-08-01 DIAGNOSIS — Z951 Presence of aortocoronary bypass graft: Secondary | ICD-10-CM

## 2017-08-01 DIAGNOSIS — E785 Hyperlipidemia, unspecified: Secondary | ICD-10-CM | POA: Diagnosis present

## 2017-08-01 LAB — I-STAT TROPONIN, ED: Troponin i, poc: 0.05 ng/mL (ref 0.00–0.08)

## 2017-08-01 LAB — CBC
HCT: 38.1 % — ABNORMAL LOW (ref 39.0–52.0)
Hemoglobin: 11.9 g/dL — ABNORMAL LOW (ref 13.0–17.0)
MCH: 33.9 pg (ref 26.0–34.0)
MCHC: 31.2 g/dL (ref 30.0–36.0)
MCV: 108.5 fL — ABNORMAL HIGH (ref 78.0–100.0)
PLATELETS: 79 10*3/uL — AB (ref 150–400)
RBC: 3.51 MIL/uL — ABNORMAL LOW (ref 4.22–5.81)
RDW: 19.4 % — AB (ref 11.5–15.5)
WBC: 6.6 10*3/uL (ref 4.0–10.5)

## 2017-08-01 LAB — BASIC METABOLIC PANEL
ANION GAP: 14 (ref 5–15)
BUN: 41 mg/dL — AB (ref 6–20)
CALCIUM: 6.7 mg/dL — AB (ref 8.9–10.3)
CO2: 23 mmol/L (ref 22–32)
CREATININE: 6.43 mg/dL — AB (ref 0.61–1.24)
Chloride: 101 mmol/L (ref 101–111)
GFR calc Af Amer: 10 mL/min — ABNORMAL LOW (ref >60)
GFR, EST NON AFRICAN AMERICAN: 8 mL/min — AB (ref >60)
Glucose, Bld: 114 mg/dL — ABNORMAL HIGH (ref 65–99)
Potassium: 4.8 mmol/L (ref 3.5–5.1)
Sodium: 138 mmol/L (ref 135–145)

## 2017-08-01 LAB — I-STAT CG4 LACTIC ACID, ED
LACTIC ACID, VENOUS: 1.78 mmol/L (ref 0.5–1.9)
Lactic Acid, Venous: 1.99 mmol/L — ABNORMAL HIGH (ref 0.5–1.9)

## 2017-08-01 LAB — PROTIME-INR
INR: 3.26
Prothrombin Time: 33 seconds — ABNORMAL HIGH (ref 11.4–15.2)

## 2017-08-01 LAB — CORTISOL: CORTISOL PLASMA: 14.6 ug/dL

## 2017-08-01 LAB — TSH: TSH: 8.689 u[IU]/mL — ABNORMAL HIGH (ref 0.350–4.500)

## 2017-08-01 LAB — VITAMIN B12: Vitamin B-12: 1605 pg/mL — ABNORMAL HIGH (ref 180–914)

## 2017-08-01 LAB — TROPONIN I
TROPONIN I: 0.08 ng/mL — AB (ref <0.03)
Troponin I: 0.07 ng/mL (ref <0.03)

## 2017-08-01 LAB — BRAIN NATRIURETIC PEPTIDE: B NATRIURETIC PEPTIDE 5: 1426.5 pg/mL — AB (ref 0.0–100.0)

## 2017-08-01 MED ORDER — UMECLIDINIUM BROMIDE 62.5 MCG/INH IN AEPB
1.0000 | INHALATION_SPRAY | Freq: Every day | RESPIRATORY_TRACT | Status: DC
Start: 2017-08-01 — End: 2017-08-13
  Administered 2017-08-01 – 2017-08-12 (×10): 1 via RESPIRATORY_TRACT
  Filled 2017-08-01 (×3): qty 7

## 2017-08-01 MED ORDER — ALBUTEROL SULFATE (2.5 MG/3ML) 0.083% IN NEBU: 2.5000 mg | INHALATION_SOLUTION | Freq: Four times a day (QID) | RESPIRATORY_TRACT | Status: DC | PRN

## 2017-08-01 MED ORDER — IPRATROPIUM-ALBUTEROL 0.5-2.5 (3) MG/3ML IN SOLN
3.0000 mL | Freq: Once | RESPIRATORY_TRACT | Status: AC
  Administered 2017-08-01: 3 mL via RESPIRATORY_TRACT
  Filled 2017-08-01: qty 3

## 2017-08-01 MED ORDER — SODIUM CHLORIDE 0.9 % IV BOLUS (SEPSIS)
500.0000 mL | Freq: Once | INTRAVENOUS | Status: AC
  Administered 2017-08-01: 500 mL via INTRAVENOUS

## 2017-08-01 MED ORDER — WARFARIN - PHARMACIST DOSING INPATIENT
Freq: Every day | Status: DC
  Administered 2017-08-04 – 2017-08-07 (×3)

## 2017-08-01 MED ORDER — MIDODRINE HCL 5 MG PO TABS: 10.0000 mg | ORAL_TABLET | Freq: Three times a day (TID) | ORAL | Status: DC

## 2017-08-01 MED ORDER — ARFORMOTEROL TARTRATE 15 MCG/2ML IN NEBU
15.0000 ug | INHALATION_SOLUTION | Freq: Two times a day (BID) | RESPIRATORY_TRACT | Status: DC
  Administered 2017-08-01 – 2017-08-12 (×22): 15 ug via RESPIRATORY_TRACT
  Filled 2017-08-01 (×27): qty 2

## 2017-08-01 MED ORDER — ACETAMINOPHEN 325 MG PO TABS
650.0000 mg | ORAL_TABLET | Freq: Four times a day (QID) | ORAL | Status: DC | PRN
  Administered 2017-08-01 – 2017-08-07 (×8): 650 mg via ORAL
  Filled 2017-08-01 (×8): qty 2

## 2017-08-01 MED ORDER — METOPROLOL TARTRATE 25 MG PO TABS
12.5000 mg | ORAL_TABLET | Freq: Two times a day (BID) | ORAL | Status: DC
  Filled 2017-08-01: qty 1

## 2017-08-01 MED ORDER — ATORVASTATIN CALCIUM 20 MG PO TABS
20.0000 mg | ORAL_TABLET | Freq: Every day | ORAL | Status: DC
  Administered 2017-08-01 – 2017-08-12 (×12): 20 mg via ORAL
  Filled 2017-08-01 (×12): qty 1

## 2017-08-01 MED ORDER — ALBUTEROL SULFATE 1.25 MG/3ML IN NEBU: 1.0000 | INHALATION_SOLUTION | Freq: Four times a day (QID) | RESPIRATORY_TRACT | Status: DC | PRN

## 2017-08-01 MED ORDER — MIDODRINE HCL 5 MG PO TABS
10.0000 mg | ORAL_TABLET | Freq: Three times a day (TID) | ORAL | Status: DC
  Administered 2017-08-01 – 2017-08-12 (×33): 10 mg via ORAL
  Filled 2017-08-01 (×35): qty 2

## 2017-08-01 MED ORDER — CEFAZOLIN SODIUM-DEXTROSE 1-4 GM/50ML-% IV SOLN
1.0000 g | Freq: Once | INTRAVENOUS | Status: AC
  Administered 2017-08-01: 1 g via INTRAVENOUS
  Filled 2017-08-01: qty 50

## 2017-08-01 MED ORDER — SODIUM CHLORIDE 0.9 % IV SOLN
62.5000 mg | INTRAVENOUS | Status: DC
  Administered 2017-08-03: 62.5 mg via INTRAVENOUS
  Filled 2017-08-01 (×3): qty 5

## 2017-08-01 MED ORDER — ACETAMINOPHEN 650 MG RE SUPP: 650.0000 mg | Freq: Four times a day (QID) | RECTAL | Status: DC | PRN

## 2017-08-01 MED ORDER — CITALOPRAM HYDROBROMIDE 20 MG PO TABS
20.0000 mg | ORAL_TABLET | Freq: Every day | ORAL | Status: DC
  Administered 2017-08-01 – 2017-08-12 (×12): 20 mg via ORAL
  Filled 2017-08-01: qty 2
  Filled 2017-08-01 (×11): qty 1

## 2017-08-01 MED ORDER — PANTOPRAZOLE SODIUM 40 MG PO TBEC
40.0000 mg | DELAYED_RELEASE_TABLET | Freq: Two times a day (BID) | ORAL | Status: DC
  Administered 2017-08-01 – 2017-08-12 (×23): 40 mg via ORAL
  Filled 2017-08-01 (×23): qty 1

## 2017-08-01 NOTE — ED Notes (Signed)
Pt aaox3 , no c/o of pain or dizziness at this time, wife at bedside

## 2017-08-01 NOTE — ED Notes (Signed)
Got patient undress on the monitor patient is resting with call bell in rerach

## 2017-08-01 NOTE — ED Notes (Signed)
Patient taken to [facility]ascular US on Tele

## 2017-08-01 NOTE — Consult Note (Signed)
Cardiology Consultation:   Patient ID: [name]; [id number]; 31-Mar-1956   Admit date: 08/01/2017 Date of Consult: 08/01/2017  Primary Care Provider: Algis Greenhouse, MD Primary Cardiologist: No primary care provider on file. Dr. Angelena Form Primary Electrophysiologist:  Dr. Rayann Heman   Patient Profile:   [name] is a 61 y.o. male with a hx of CAD s/p 2V CABG in 2014 and subsequent PCI procedures, AVR in 2014 with bioprosthetic AVR, ESRD on HD, persistent atrial fibrillation/flutter, multiple sclerosis and sleep apnea who is being seen today for the evaluation of atrial flutter at the request of Dr. Posey Pronto.  History of Present Illness:   [name] has a hx of CAD s/p 2V CABG in 2014 and subsequent PCI procedures, AVR in 2014 with bioprosthetic AVR, ESRD on HD, persistent atrial fibrillation/flutter, multiple sclerosis and sleep apnea.   In 2014 after a cardiac arrest during VATS procedure. He was found to have moderate multi-vessel CAD but had worsened symptoms on medical therapy and had 2 V CABG in 2014 along with AVR at [facility]. A bioprosthetic AVR was placed. He had a  NSTEMI in May 2016 at [facility] and had a Resolute DES placed in the Circumflex and a bare metal stent placed in the SVG to LAD. He was admitted to [facility] with an anterior STEMI in June 2017 secondary to acute occlusion of the SVG to LAD treated with a DES. His Plavix had been stopped prior to planned renal transplant. Admitted to [facility] July 2017 with volume overload. Echo July 2017 with normal LVEF, moderate MR, normally functioning bioprosthetic AVR. 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  He has had atrial arrhythmias since 2016, SVT and atrial flutter.  He was placed on amiodarone and had DCCV and since then hx of recurrent a fib/flutter.  Plan was for ablation.  He was referred to [facility] EP clinic and ablation was planned but he was found to have a lung cancer and this delayed his ablation. 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 He was put on Toprol and midodrine.  He had the atrial flutter ablation on 03/15/17. He was switched from coumadin to Eliquis and Plavix before d/c from [facility]. Following this he was readmitted with GI bleeding and EGD suggested esophagitis. He was discharged again from [facility] on Eliquis and ASA.   Echo June 2018 with EF 55%, ? Mitral stenosis, MAC, MR regurg.    He has failed a flutter ablation at [facility] and with in a fib/flutter 06/20/17 on OV.  Amiodarone has been stopped due to lung disease.  He was put back on coumadin in Oct 2018 but not the ASA and plavix though with his AVR and CAD they would be beneficial but were stopped for GI bleeding.     He saw Dr. Rayann Heman 07/06/17 and continued in a flutter with RVR and hypotensive.  Plan was for coumadin over NOAC therapy.   Dr. Rayann Heman notes he was to be seen at [facility] soon.  Though on last [facility] EP note "ablation of atrial fibrillation and atypical left-sided atrial flutter is high risk at this time and therefore, cannot be pursued at this time" per Dr. Antonieta Pert.   His BP 07/06/17 day was 84/58 pulse at 118.    Today pt presents to ER with dizziness since fistula placed 3 weeks ago.  Did not have dialysis today.  His BP is lower than his usual at 70's his  normal low is lower 90s to upper 80s. Systolic.  BP was 73/55, P 113 afebrile.  He was given 500 cc NS and Cefazolin for possible cellulitis at his Lt AVF site.  He is on midodrine 10 mg TID.  He remains in a flutter, on BB but unable to titrate due to hypotension.    EKG:  The EKG was personally reviewed and demonstrates:   a flutter with varying degrees of block RBBB no changes form 07/06/17 Telemetry:  Telemetry was personally reviewed and demonstrates:  A flutter rate 116  BNP 1426 Troponin 0.05  Lactic acit 1.99 then 1.78 Hgb 11.9, plts 79  (plts in August were 146)  INR 3.26   TSH 8.689 Cortisol 14.6 CXR  No acute  cardiopulmonary abnormalities. Chronic bilateral scarring    Currently no recent chest pain.  His SOB is episodic but no acute changes.  Does not awaken from sleep.  He has been more lightheaded than usual over last two weeks with lower BP.  In Dr. Marin Olp note she stated BP on arterial line was higher than cuff pressures, perhaps cuff pressures could be falsely low.  Though even so he was symptomatic with the hypotension today.      Past Medical History:  Diagnosis Date  . Acute blood loss anemia    a. 02/2016 due to groin hematoma. Rec 3 U PRBC.  Marland Kitchen Anemia   . Anxiety   . Aortic heart valve prolapse 04/2013   a. s/p bioprosthetic AVR at time of CABG.  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 Bifascicular block   . CAD (coronary artery disease) 04/2013   a. 04/2013: s/p CABG x 2 (Y SVG -LAD & D2). b. 01/2015: NSTEMI s/p DES to LCx, BMS to SVG-LAD. c. NSTEMI 05/2015 during AF/AFL - non-flow limiting FFR. d. Low risk nuc 3/17. e. STEMI 6/17 after coming off Plavix, s/p DES to SVG-LAD into native vessel.  . Cancer ([place])    lung cancer - 5 doses of radiation, F/U in Dec. 2018  . Carotid artery disease (HCC)    a. 1-39% stenosis bilaterally in 06/2015.   Marland Kitchen CHF (congestive heart failure) ([place])   . Chronic respiratory failure ([place])   . COPD (chronic obstructive pulmonary disease) ([place])   . Dyspnea   . 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 b. He had a renal Tx from 2008 to 2011, but subsequent rejection - Gets HD in [place], [place] on MWF schedule.    Marland Kitchen GERD (gastroesophageal reflux disease)   . Hematoma    a. Large right groin hematoma after cath 02/2016 with associated ABL anemia.  . Hypertension   . Multiple sclerosis ([place])   . Myocardial infarction ([place])   . Nocturnal hypoxemia   . On home oxygen therapy    pt states he has not been wearing it  . PAF (paroxysmal atrial fibrillation) ([place])    a. h/o, placed on amiodarone 07/2016 due  to recurrence.  . Paroxysmal atrial flutter ([place])    a. During 05/2015 admission - SVT, atrial flutter, and PAF.  Marland Kitchen Peripheral vascular disease ([place])    Cath in 01/2015 required 45 cm [place]ath  . Pneumonia   . Renal transplant failure and rejection   . 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 intolerant of bipap. Sleeps with  O2 at 2 L  . SVT (supraventricular tachycardia) ([place])   . Valvular heart disease    a. 2D Echo 03/25/16: mild LVH, EF 50-55%, grade 2 Dd, AVR present with mild AI, mod MR, severely dilated LA, mildly dilated RV, mod RAE, PASP 72.    Past Surgical History:  Procedure Laterality Date  . APPENDECTOMY    . AV FISTULA PLACEMENT    . AV FISTULA PLACEMENT Left 07/19/2017   Procedure: LEFT BRACHIOCEPHALIC ARTERIOVENOUS FISTULA CREATION;  Surgeon: Elam Dutch, MD;  Location: [place];  Service: Vascular;  Laterality: Left;  . CARDIAC CATHETERIZATION N/A 05/26/2015   Procedure: Left Heart Cath and Coronary Angiography;  Surgeon: Leonie Man, MD;  Location: [place] CV LAB;  Service: Cardiovascular;  Laterality: N/A;  . CARDIAC CATHETERIZATION N/A 05/26/2015   Procedure: Intravascular Pressure Wire/FFR Study;  Surgeon: Leonie Man, MD;  Location: [place] CV LAB;  Service: Cardiovascular;  Laterality: N/A;  . CARDIAC CATHETERIZATION N/A 02/15/2016   Procedure: Left Heart Cath and Coronary Angiography;  Surgeon: Wellington Hampshire, MD;  Location: [place] CV LAB;  Service: Cardiovascular;  Laterality: N/A;  . CARDIOVERSION N/A 07/12/2016   Procedure: CARDIOVERSION;  Surgeon: Minus Breeding, MD;  Location: [facility] ENDOSCOPY;  Service: Cardiovascular;  Laterality: N/A;  . CARDIOVERSION N/A 11/11/2016   Procedure: CARDIOVERSION;  Surgeon: Fay Records, MD;  Location: [place];  Service: Cardiovascular;  Laterality: N/A;  . CARDIOVERSION N/A 12/09/2016   Procedure: CARDIOVERSION;  Surgeon: Sanda Klein, MD;  Location: [facility]  ENDOSCOPY;  Service: Cardiovascular;  Laterality: N/A;  . COLONOSCOPY    . CORONARY ANGIOPLASTY    . CORONARY ARTERY BYPASS GRAFT    . ESOPHAGOGASTRODUODENOSCOPY ENDOSCOPY    . excised squamous cells at rectum  2006  . flash     flash pulmonary edema  . HERNIA REPAIR  [id number]   umbilical hernia  . KIDNEY TRANSPLANT  08/2007  . KNEE SURGERY    . LEFT HEART CATHETERIZATION WITH CORONARY ANGIOGRAM N/A 01/09/2013   Procedure: LEFT HEART CATHETERIZATION WITH CORONARY ANGIOGRAM;  Surgeon: Burnell Blanks, MD;  Location: [facility] CATH LAB;  Service: Cardiovascular;  Laterality: N/A;  . PARATHYROIDECTOMY    . TEE WITHOUT CARDIOVERSION N/A 11/11/2016   Procedure: TRANSESOPHAGEAL ECHOCARDIOGRAM (TEE);  Surgeon: Fay Records, MD;  Location: [place];  Service: Cardiovascular;  Laterality: N/A;  . TEE WITHOUT CARDIOVERSION N/A 12/09/2016   Procedure: TRANSESOPHAGEAL ECHOCARDIOGRAM (TEE);  Surgeon: Sanda Klein, MD;  Location: [facility] ENDOSCOPY;  Service: Cardiovascular;  Laterality: N/A;     Home Medications:  Prior to Admission medications   Medication Sig Start Date End Date Taking? Authorizing Provider  acetaminophen (TYLENOL) 500 MG tablet Take 1,000 mg every 6 (six) hours as needed by mouth for mild pain.    Yes [provider]  albuterol (ACCUNEB) 1.25 MG/3ML nebulizer solution Take 3 mLs (1.25 mg total) by nebulization every 6 (six) hours as needed for wheezing. 06/16/17  Yes Tanda Rockers, MD  albuterol (PROAIR HFA) 108 (90 Base) MCG/ACT inhaler Inhale 2 puffs every 6 (six) hours as needed into the lungs for wheezing or shortness of breath. 07/19/17  Yes Rhyne, Hulen Shouts, PA-C  atorvastatin (LIPITOR) 40 MG tablet Take 0.5 tablets (20 mg total) by mouth at bedtime. 05/06/17  Yes Burnell Blanks, MD  calcium acetate (PHOSLO) 667 MG capsule Take 4,002 mg by mouth 3 (three) times daily with meals.    Yes [provider]  citalopram (CELEXA) 20  MG tablet Take 20 mg by  mouth at bedtime.    Yes [provider]  diphenhydrAMINE (BENADRYL) 2 % cream Apply 1 application topically daily as needed for itching.   Yes [provider]  docusate sodium (COLACE) 100 MG capsule Take 100 mg by mouth 2 (two) times daily.    Yes [provider]  folic acid (FOLVITE) 1 MG tablet Take 1 mg by mouth daily.    Yes [provider]  gabapentin (NEURONTIN) 300 MG capsule Take 300 mg by mouth at bedtime. Patient taking two 373m capsules at bedtime sometime.   Yes [provider]  metoprolol tartrate (LOPRESSOR) 25 MG tablet Take 12.5 mg by mouth 2 (two) times daily.   Yes [provider]  midodrine (PROAMATINE) 10 MG tablet Take 10 mg by mouth 3 (three) times daily.   Yes [provider]  multivitamin (RENA-VIT) TABS tablet Take 1 tablet by mouth daily. 01/11/13  Yes ESamella Parr NP  OXYGEN 2lpm with sleep and "at home"   Yes [provider]  oxymetazoline (AFRIN) 0.05 % nasal spray Place 1 spray into both nostrils 2 (two) times daily as needed for congestion.   Yes [provider]  pantoprazole (PROTONIX) 40 MG tablet Take 1 tablet (40 mg total) by mouth 2 (two) times daily. 02/09/17  Yes MBurnell Blanks MD  polyethylene glycol (MIRALAX / GLYCOLAX) packet Take 17 g daily as needed by mouth for mild constipation. Mix in 8 oz liquid and drink    Yes [provider]  STIOLTO RESPIMAT 2.5-2.5 MCG/ACT AERS INHALE 2 PUFFS BY MOUTH DAILY 07/06/17  Yes [provider]  warfarin (COUMADIN) 7.5 MG tablet Take 0.5-1 tablets (3.75-7.5 mg total) See admin instructions by mouth. Patient taking differently: Take 3.75-7.5 mg by mouth See admin instructions. On Monday (only) he takes one tablet 7.510m 07/19/17  Yes Rhyne, Samantha J, PA-C  nitroGLYCERIN (NITROSTAT) 0.4 MG SL tablet Place 0.4 mg under the tongue every 5 (five) minutes as needed for chest pain (max 3 doses - if no relief call MD).      [provider]  oxyCODONE (ROXICODONE) 5 MG immediate release tablet Take 1 tablet (5 mg total) every 6 (six) hours as needed by mouth. 07/19/17   Rhyne, SaHulen ShoutsPA-C    Inpatient Medications: Scheduled Meds: . arformoterol  15 mcg Nebulization BID  . atorvastatin  20 mg Oral QHS  . citalopram  20 mg Oral QHS  . metoprolol tartrate  12.5 mg Oral BID  . midodrine  10 mg Oral TID  . pantoprazole  40 mg Oral BID  . umeclidinium bromide  1 puff Inhalation Daily  . Warfarin - Pharmacist Dosing Inpatient   Does not apply q1800   Continuous Infusions:  PRN Meds: acetaminophen **OR** acetaminophen, albuterol  Allergies:    Allergies  Allergen Reactions  . Adhesive [Tape] Rash    Please use paper tape  . Amoxicillin Rash    migraine  . Ativan [Lorazepam] Anxiety  . Bupropion Anxiety  . Diphenhydramine Itching and Anxiety    Only with IV doses. Tolerates oral.  . Penicillins Other (See Comments)    MIGRAINE  Has patient had a PCN reaction causing immediate rash, facial/tongue/throat swelling, SOB or lightheadedness with hypotension: no Has patient had a PCN reaction causing severe rash involving mucus membranes or skin necrosis: No Has patient had a PCN reaction that required hospitalization No Has patient had a PCN reaction occurring within the  last 10 years: No If all of the above answers are "NO", then may proceed with Cephalosporin use.    Social History:   Social History   Socioeconomic History  . Marital status: Married    Spouse name: Not on file  . Number of children: Not on file  . Years of education: Not on file  . Highest education level: Not on file  Social Needs  . Financial resource strain: Not on file  . Food insecurity - worry: Not on file  . Food insecurity - inability: Not on file  . Transportation needs - medical: Not on file  . Transportation needs - non-medical: Not on file  Occupational History  . Occupation: Disabled  Tobacco Use   . Smoking status: Former Smoker    Packs/day: 2.00    Years: 20.00    Pack years: 40.00    Types: Cigarettes    Last attempt to quit: 08/06/1998    Years since quitting: 19.0  . Smokeless tobacco: Never Used  Substance and Sexual Activity  . Alcohol use: No    Alcohol/week: 0.0 oz  . Drug use: No  . Sexual activity: Not on file  Other Topics Concern  . Not on file  Social History Narrative   No history of premature CAD in either parents or siblings. However, his maternal grandfather and 2 maternal uncles had coronary artery disease.    Family History:    Family History  Problem Relation Age of Onset  . Heart failure Mother        started in 40s  . Emphysema Mother        smoked  . Diabetes Father   . Lupus Sister   . Heart attack Maternal Grandfather   . Heart attack Maternal Uncle   . Heart attack Maternal Aunt   . Hypertension Maternal Aunt   . Stroke Neg Hx      ROS:  Please see the history of present illness.  ROS  General:no colds or fevers, no weight changes Skin:no rashes or ulcers HEENT:no blurred vision, no congestion CV:see HPI PUL:see HPI--on home oxygen GI:no diarrhea constipation or melena, no indigestion GU:no hematuria, no dysuria MS:no joint pain, no claudication Neuro:no syncope, + lightheadedness over last 2 weeks Endo:no diabetes, no thyroid disease though TSH is 8 today      Physical Exam/Data:   Vitals:   08/01/17 1215 08/01/17 1230 08/01/17 1245 08/01/17 1300  BP: (!) 69/60  (!) 68/56   Pulse: (!) 114 (!) 113 (!) 114 (!) 111  Resp: (!) _0 (!) 23  Temp:      TempSrc:      SpO2: 100% 99% 98% 100%  Weight:      Height:       No intake or output data in the 24 hours ending 08/01/17 1500 Filed Weights   08/01/17 0756  Weight: 170 lb (77.1 kg)   Body mass index is 24.39 kg/m.  General:  Well nourished, well developed, in no acute distress resting in bed. HEENT: normal Lymph: no adenopathy Neck: mild JVD Endocrine:  No  thryomegaly Vascular: No carotid bruits; 1+ pedal pulses bil Cardiac:  normal S1, S2; RRR; soft systolic murmur no gallup rub or click Lungs:  Bilateral breath sounds to auscultation bilaterally,  Breath sounds decreased in bases, no wheezing, rhonchi or rales  Abd: soft, nontender, no hepatomegaly  Ext: no edema, Lt antecubital incision with redness and swelling Musculoskeletal:  No deformities, BUE and BLE strength  normal and equal Skin: warm and dry  Neuro: alert and oriented X 3 MAE follows commands, + facial symmetry., no focal abnormalities noted Psych:  Normal affect    Relevant CV Studies: Cardiac cath 02/15/2016  Conclusion    Mid LAD lesion, 100% stenosed.  Ost 2nd Diag lesion, 99% stenosed.  Ost LAD lesion, 40% stenosed.  Mid Cx lesion, 65% stenosed. The lesion was previously treated with a drug-eluting stent between one and five months ago. May 2016  Sequential SVG was injected is large. Very large (greater than 6 mm)  This is a Y graft that goes to D2 and LAD.  Sequential SVG was injected is large. Very large  Y graft to both D2 and LAD  Mid Graft lesion, 50% stenosed.  Prox Graft to Mid Graft lesion, 10% stenosed. The lesion was previously treated with a bare metal stent between one and five months ago. May 2016  Origin lesion, 40% stenosed.  Prox RCA lesion, 60% stenosed.  Dist Graft to Insertion lesion, 100% stenosed. Post intervention, there is a 0% residual stenosis.   1. Severe one-vessel coronary artery disease with acute occlusion of SVG to LAD at the anastomosis. Patent left circumflex stent with moderate restenosis. Moderate RCA disease. Patent Y graft to diagonal part with moderate ostial stenosis.  2. Successful angioplasty and drug-eluting stent placement to the anastomosis of SVG to LAD extending into the native vessel.  Recommendations: This was a very difficult procedure due to excessive tortuosity of the right artery in spite of using a  long sheath. There was difficulty engaging the coronary arteries and advancing equipment. The patient developed moderate size hematoma which improved with manual compression. Recommend dual antiplatelet therapy for at least one year and ideally indefinitely given multiple stents. Warfarin can be resumed tomorrow if no significant groin hematoma. The patient can ultimately be treated with Plavix and warfarin without aspirin after at least 1-2 weeks of triple therapy. Obtain an echo. The aortic valve was not crossed.     LAST TEE and DCCV 12/09/16 Study Conclusions  - Left ventricle: The cavity size was normal. Wall thickness was   increased in a pattern of moderate LVH. There was concentric   hypertrophy. Systolic function was mildly reduced. The estimated   ejection fraction was in the range of 45% to 50%. No evidence of   thrombus. - Aortic valve: A bioprosthesis was present and functioning   normally. There was mild regurgitation. There was no significant   perivalvular regurgitation. - Mitral valve: Severely calcified annulus. Mildly thickened   leaflets . The findings are consistent with trivial stenosis.   There was severe regurgitation directed eccentrically and   posteriorly. Severe regurgitation is suggested by pulmonary vein   systolic flow reversal. - Left atrium: The atrium was mildly to moderately dilated. No   evidence of thrombus in the atrial cavity or appendage. No   spontaneous echo contrast was observed. - Right ventricle: The cavity size was mildly dilated. - Right atrium: The atrium was mildly dilated. - Atrial septum: No defect or patent foramen ovale was identified. - Tricuspid valve: No evidence of vegetation. There was   regurgitation directed eccentrically and toward the free wall.   There was moderate-severe perivalvular regurgitation.  Impressions:  - Successful cardioversion. No cardiac source of emboli was   indentified.  Ablation 03/2017  At [facility].    Findings:   1. Patient presented to the lab in atypical/left-sided atrial flutter  confirmed by entrainment mapping s/p successful  cardioversion with one  200J shock 2. Status post successful cavo-tricuspid isthmus ablation with  demonstration of bidirectional block.  3. For approximately 10 minutes after the cardioversion, patient was  atrial pacing dependent (likely sinus node dysfunction); after 10 minutes,  sinus rhythm came back. By the end of the case, patient was in normal  sinus rhythm at 60-70bpm with normal blood pressures. 4. Patient's systolic blood pressures on arterial line were 100-110s. Cuff  pressures could be falsely low due to arterial/vascular disease.    Plan:  1. 3 hours bedrest post-venous sheath pull.  2. Continue warfarin.Do not restart heparin gtt tonight given groin  access.  3. Followup in clinic with Dr. Antonieta Pert in 6-8 weeks.   Laboratory Data:  Chemistry Recent Labs  Lab 08/01/17 0801  NA 138  K 4.8  CL 101  CO2 23  GLUCOSE 114*  BUN 41*  CREATININE 6.43*  CALCIUM 6.7*  GFRNONAA 8*  GFRAA 10*  ANIONGAP 14    No results for input(s): PROT, ALBUMIN, AST, ALT, ALKPHOS, BILITOT in the last 168 hours. Hematology Recent Labs  Lab 08/01/17 0801  WBC 6.6  RBC 3.51*  HGB 11.9*  HCT 38.1*  MCV 108.5*  MCH 33.9  MCHC 31.2  RDW 19.4*  PLT 79*   Cardiac EnzymesNo results for input(s): TROPONINI in the last 168 hours.  Recent Labs  Lab 08/01/17 0813  TROPIPOC 0.05    BNP Recent Labs  Lab 08/01/17 1002  BNP 1,426.5*    DDimer No results for input(s): DDIMER in the last 168 hours.  Radiology/Studies:  Dg Chest 2 View  Result Date: 08/01/2017 CLINICAL DATA:  Shortness of breath. EXAM: CHEST  2 VIEW COMPARISON:  06/16/2017 FINDINGS: There is a right chest port a catheter with tip in the projection of the cavoatrial junction. The heart size is moderately enlarged. Status post median sternotomy and CABG procedure. The proximal  sternotomy wire appears chronically fractured. Chronic interstitial changes are identified bilaterally with scarring in the perihilar left upper lobe and right base. No superimposed pulmonary edema, pleural effusion or scarring. IMPRESSION: 1. No acute cardiopulmonary abnormalities. 2. Chronic bilateral scarring. Electronically Signed   By: Kerby Moors M.D.   On: 08/01/2017 08:30    Assessment and Plan:   1. Hypotension - chronic and has increased despite midodrine 10 mg TID.   2.          Persistent A flutter, failed ablation and unable to take amiodarone due to lung disease.  Difficult to control rate due to hypotension and cannot increase BB.  No longer followed by EP at [facility]. Continue BB at 12.5   3.         CAD with CABG and last cath 2017 with PCI with DES to the anastomosis of SVG to LAD extending into the native vessel has had previous PCIs as well  No longer on ASA or plavix due to GI bleed  No chest pain troponin 0.04   4.          ESRD on HD MWF unable to have today with hypotension.   5.          New AV fistula in Lt arm, red at site has rec'd ABX   6.          Anticoagulation on coumadin with hx of GI bleed has been back on Coumadin for a little over 1 month with therapeutic INR.    7.  HX AVR with bioprosthetic AVR.  In 2014   8.          MR severe by TEE in 12/2016  9.           Chronic diastolic HF  stable   For questions or updates, please contact [place] Please consult [email or web address] for contact info under Cardiology/STEMI.   Signed, Cecilie Kicks, NP  08/01/2017 3:00 PM;  Attending Note:   The patient was seen and examined.  Agree with assessment and plan as noted above.  Changes made to the above note as needed.  Patient seen and independently examined with Cecilie Kicks, NP .   We discussed all aspects of the encounter. I agree with the assessment and plan as stated above.  1.  Atrial flutter: This is a chronic problem.  He has had chronic  atrial flutter and is status post ablation in [facility].  He has had recurrent atrial flutter.  He is been seen by electrophysiology here at [facility]  and also in [facility] there is no clear-cut plan on how to better control this atrial flutter.  2.  Hypotension: The patient has chronic hypertension.  He has been on midodrine.  His blood pressure is clearly lower today and was too low to have dialysis.   I suspect that this might be due to underlying infection. He is asymptomatic for the time being.  He needs to have his underlying medical issues treated. Stat echocardiogram has been ordered and is pending.   This hypotension  is further complicated by his severe mitral regurgitation.  3.  Possible infection of his left arm dialysis graft.  She has swelling and erythema proximal to his dialysis graft incision.  I suspect this may be infected.  The area is quite warm.    I have spent a total of 40 minutes with patient reviewing hospital  notes , telemetry, EKGs, labs and examining patient as well as establishing an assessment and plan that was discussed with the patient. > 50% of time was spent in direct patient care.    Thayer Headings, Brooke Bonito., MD, [facility] 08/01/2017, 5:01 PM [address]  [place] Pager [phone]

## 2017-08-01 NOTE — Consult Note (Signed)
[facility] Renal Consultation Note  Indication for Consultation:  Management of ESRD/hemodialysis; anemia, hypertension/volume and secondary hyperparathyroidism  HPI: [name] is a 61 y.o. male wit ESRD (s/p prior transplant with rejection) chronic HD MWF ([facility] ) hypotension  On Midodrine, CAD s/p 2V CABG 2014 and DES (hx of STEMI & NSTEMI), CHF (EF 45-50%), Permanent Atrial Flutter/Fibrillation (s/p cardioversion 12/09/16 and failed ablation 03/15/17) on Coumadin, s/p bioprosthetic Aortic Valve/ Replacement,COPD on 2L home O2, Multiple Sclerosis, Squamous Cell Cancer of the Lung (s/p SBRT), PVD,presented to the ED from his OP dialysis center because too  Dizzy to be on HD and hypotension 69/53  bp in ER . k 4.8  hgb 11.9 , CXR with  No  Acute cardiopulmonary abnormalities .   Gives HO lightheadedness since the procedure of Nov 13  New LUA AVF insert  and also states  was started on trazodone around the same time and initially attributed his symptoms to the medication and stopped taking it but has had no improvement in his symptoms. In ER with Wife  Denies chills, fevers, cp  , sob, n, v ,d , adb pain or headache.      Past Medical History:  Diagnosis Date  . Acute blood loss anemia    a. 02/2016 due to groin hematoma. Rec 3 U PRBC.  Marland Kitchen Anemia   . Anxiety   . Aortic heart valve prolapse 04/2013   a. s/p bioprosthetic AVR at time of CABG.  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 Bifascicular block   . CAD (coronary artery disease) 04/2013   a. 04/2013: s/p CABG x 2 (Y SVG -LAD & D2). b. 01/2015: NSTEMI s/p DES to LCx, BMS to SVG-LAD. c. NSTEMI 05/2015 during AF/AFL - non-flow limiting FFR. d. Low risk nuc 3/17. e. STEMI 6/17 after coming off Plavix, s/p DES to SVG-LAD into native vessel.  . Cancer ([place])    lung cancer - 5 doses of radiation, F/U in Dec. 2018  . Carotid artery disease (HCC)    a. 1-39% stenosis bilaterally in 06/2015.   Marland Kitchen CHF (congestive heart  failure) ([place])   . Chronic respiratory failure ([place])   . COPD (chronic obstructive pulmonary disease) ([place])   . Dyspnea   . 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 b. He had a renal Tx from 2008 to 2011, but subsequent rejection - Gets HD in [place], [place] on MWF schedule.    Marland Kitchen GERD (gastroesophageal reflux disease)   . Hematoma    a. Large right groin hematoma after cath 02/2016 with associated ABL anemia.  . Hypertension   . Multiple sclerosis ([place])   . Myocardial infarction ([place])   . Nocturnal hypoxemia   . On home oxygen therapy    pt states he has not been wearing it  . PAF (paroxysmal atrial fibrillation) ([place])    a. h/o, placed on amiodarone 07/2016 due to recurrence.  . Paroxysmal atrial flutter ([place])    a. During 05/2015 admission - SVT, atrial flutter, and PAF.  Marland Kitchen Peripheral vascular disease ([place])    Cath in 01/2015 required 45 cm Destination Sheath  . Pneumonia   . Renal transplant failure and rejection   . 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 intolerant of bipap. Sleeps with O2 at 2 L  . SVT (supraventricular  tachycardia) ([place])   . Valvular heart disease    a. 2D Echo 03/25/16: mild LVH, EF 50-55%, grade 2 Dd, AVR present with mild AI, mod MR, severely dilated LA, mildly dilated RV, mod RAE, PASP 72.    Past Surgical History:  Procedure Laterality Date  . APPENDECTOMY    . AV FISTULA PLACEMENT    . AV FISTULA PLACEMENT Left 07/19/2017   Procedure: LEFT BRACHIOCEPHALIC ARTERIOVENOUS FISTULA CREATION;  Surgeon: Elam Dutch, MD;  Location: [place];  Service: Vascular;  Laterality: Left;  . CARDIAC CATHETERIZATION N/A 05/26/2015   Procedure: Left Heart Cath and Coronary Angiography;  Surgeon: Leonie Man, MD;  Location: [place] CV LAB;  Service: Cardiovascular;  Laterality: N/A;  . CARDIAC CATHETERIZATION N/A 05/26/2015   Procedure: Intravascular Pressure Wire/FFR Study;  Surgeon:  Leonie Man, MD;  Location: [place] CV LAB;  Service: Cardiovascular;  Laterality: N/A;  . CARDIAC CATHETERIZATION N/A 02/15/2016   Procedure: Left Heart Cath and Coronary Angiography;  Surgeon: Wellington Hampshire, MD;  Location: [place] CV LAB;  Service: Cardiovascular;  Laterality: N/A;  . CARDIOVERSION N/A 07/12/2016   Procedure: CARDIOVERSION;  Surgeon: Minus Breeding, MD;  Location[facility] ENDOSCOPY;  Service: Cardiovascular;  Laterality: N/A;  . CARDIOVERSION N/A 11/11/2016   Procedure: CARDIOVERSION;  Surgeon: Fay Records, MD;  Location: [place];  Service: Cardiovascular;  Laterality: N/A;  . CARDIOVERSION N/A 12/09/2016   Procedure: CARDIOVERSION;  Surgeon: Sanda Klein, MD;  Location: [facility] ENDOSCOPY;  Service: Cardiovascular;  Laterality: N/A;  . COLONOSCOPY    . CORONARY ANGIOPLASTY    . CORONARY ARTERY BYPASS GRAFT    . ESOPHAGOGASTRODUODENOSCOPY ENDOSCOPY    . excised squamous cells at rectum  2006  . flash     flash pulmonary edema  . HERNIA REPAIR  12/5407   umbilical hernia  . KIDNEY TRANSPLANT  08/2007  . KNEE SURGERY    . LEFT HEART CATHETERIZATION WITH CORONARY ANGIOGRAM N/A 01/09/2013   Procedure: LEFT HEART CATHETERIZATION WITH CORONARY ANGIOGRAM;  Surgeon: Burnell Blanks, MD;  Location[facility] CATH LAB;  Service: Cardiovascular;  Laterality: N/A;  . PARATHYROIDECTOMY    . TEE WITHOUT CARDIOVERSION N/A 11/11/2016   Procedure: TRANSESOPHAGEAL ECHOCARDIOGRAM (TEE);  Surgeon: Fay Records, MD;  Location: [place];  Service: Cardiovascular;  Laterality: N/A;  . TEE WITHOUT CARDIOVERSION N/A 12/09/2016   Procedure: TRANSESOPHAGEAL ECHOCARDIOGRAM (TEE);  Surgeon: Sanda Klein, MD;  Location: [facility] ENDOSCOPY;  Service: Cardiovascular;  Laterality: N/A;      Family History  Problem Relation Age of Onset  . Heart failure Mother        started in 74s  . Emphysema Mother        smoked  . Diabetes Father   . Lupus Sister   . Heart attack Maternal Grandfather   .  Heart attack Maternal Uncle   . Heart attack Maternal Aunt   . Hypertension Maternal Aunt   . Stroke Neg Hx       reports that he quit smoking about 19 years ago. His smoking use included cigarettes. He has a 40.00 pack-year smoking history. he has never used smokeless tobacco. He reports that he does not drink alcohol or use drugs.   Allergies  Allergen Reactions  . Adhesive [Tape] Rash    Please use paper tape  . Amoxicillin Rash    migraine  . Ativan [Lorazepam] Anxiety  . Bupropion Anxiety  . Diphenhydramine Itching and Anxiety    Only with  IV doses. Tolerates oral.  . Penicillins Other (See Comments)    MIGRAINE  Has patient had a PCN reaction causing immediate rash, facial/tongue/throat swelling, SOB or lightheadedness with hypotension: no Has patient had a PCN reaction causing severe rash involving mucus membranes or skin necrosis: No Has patient had a PCN reaction that required hospitalization No Has patient had a PCN reaction occurring within the last 10 years: No If all of the above answers are "NO", then may proceed with Cephalosporin use.    Prior to Admission medications   Medication Sig Start Date End Date Taking? Authorizing Provider  acetaminophen (TYLENOL) 500 MG tablet Take 1,000 mg every 6 (six) hours as needed by mouth for mild pain.    Yes [provider]  albuterol (ACCUNEB) 1.25 MG/3ML nebulizer solution Take 3 mLs (1.25 mg total) by nebulization every 6 (six) hours as needed for wheezing. 06/16/17  Yes Tanda Rockers, MD  albuterol (PROAIR HFA) 108 (90 Base) MCG/ACT inhaler Inhale 2 puffs every 6 (six) hours as needed into the lungs for wheezing or shortness of breath. 07/19/17  Yes Rhyne, Hulen Shouts, PA-C  atorvastatin (LIPITOR) 40 MG tablet Take 0.5 tablets (20 mg total) by mouth at bedtime. 05/06/17  Yes Burnell Blanks, MD  calcium acetate (PHOSLO) 667 MG capsule Take 4,002 mg by mouth 3 (three) times daily with meals.    Yes [provider]  citalopram (CELEXA) 20 MG tablet Take 20 mg by mouth at bedtime.    Yes [provider]  diphenhydrAMINE (BENADRYL) 2 % cream Apply 1 application topically daily as needed for itching.   Yes [provider]  docusate sodium (COLACE) 100 MG capsule Take 100 mg by mouth 2 (two) times daily.    Yes [provider]  folic acid (FOLVITE) 1 MG tablet Take 1 mg by mouth daily.    Yes [provider]  gabapentin (NEURONTIN) 300 MG capsule Take 300 mg by mouth at bedtime. Patient taking two 332m capsules at bedtime sometime.   Yes [provider]  metoprolol tartrate (LOPRESSOR) 25 MG tablet Take 12.5 mg by mouth 2 (two) times daily.   Yes [provider]  midodrine (PROAMATINE) 10 MG tablet Take 10 mg by mouth 3 (three) times daily.   Yes [provider]  multivitamin (RENA-VIT) TABS tablet Take 1 tablet by mouth daily. 01/11/13  Yes ESamella Parr NP  OXYGEN 2lpm with sleep and "at home"   Yes [provider]  oxymetazoline (AFRIN) 0.05 % nasal spray Place 1 spray into both nostrils 2 (two) times daily as needed for congestion.   Yes [provider]  pantoprazole (PROTONIX) 40 MG tablet Take 1 tablet (40 mg total) by mouth 2 (two) times daily. 02/09/17  Yes MBurnell Blanks MD  polyethylene glycol (MIRALAX / GLYCOLAX) packet Take 17 g daily as needed by mouth for mild constipation. Mix in 8 oz liquid and drink    Yes [provider]  STIOLTO RESPIMAT 2.5-2.5 MCG/ACT AERS INHALE 2 PUFFS BY MOUTH DAILY 07/06/17  Yes [provider]  warfarin (COUMADIN) 7.5 MG tablet Take 0.5-1 tablets (3.75-7.5 mg total) See admin instructions by mouth. Patient taking differently: Take 3.75-7.5 mg by mouth See admin instructions. On Monday (only) he takes one tablet 7.538m 07/19/17  Yes Rhyne, Samantha J, PA-C  nitroGLYCERIN (NITROSTAT) 0.4 MG SL tablet Place 0.4 mg under the tongue every 5 (five) minutes  as needed for chest pain (max 3 doses -  if no relief call MD).     [provider]  oxyCODONE (ROXICODONE) 5 MG immediate release tablet Take 1 tablet (5 mg total) every 6 (six) hours as needed by mouth. 07/19/17   Rhyne, Hulen Shouts, PA-C     Anti-infectives (From admission, onward)   Start     Dose/Rate Route Frequency Ordered Stop   08/01/17 1115  ceFAZolin (ANCEF) IVPB 1 g/50 mL premix     1 g 100 mL/hr over 30 Minutes Intravenous  Once 08/01/17 1111 08/01/17 1245      Results for orders placed or performed during the hospital encounter of 08/01/17 (from the past 48 hour(s))  Basic metabolic panel     Status: Abnormal   Collection Time: 08/01/17  8:01 AM  Result Value Ref Range   Sodium 138 135 - 145 mmol/L   Potassium 4.8 3.5 - 5.1 mmol/L   Chloride 101 101 - 111 mmol/L   CO2 23 22 - 32 mmol/L   Glucose, Bld 114 (H) 65 - 99 mg/dL   BUN 41 (H) 6 - 20 mg/dL   Creatinine, Ser 6.43 (H) 0.61 - 1.24 mg/dL   Calcium 6.7 (L) 8.9 - 10.3 mg/dL   GFR calc non Af Amer 8 (L) >60 mL/min   GFR calc Af Amer 10 (L) >60 mL/min    Comment: (NOTE) The eGFR has been calculated using the CKD EPI equation. This calculation has not been validated in all clinical situations. eGFR's persistently <60 mL/min signify possible Chronic Kidney Disease.    Anion gap 14 5 - 15  CBC     Status: Abnormal   Collection Time: 08/01/17  8:01 AM  Result Value Ref Range   WBC 6.6 4.0 - 10.5 K/uL   RBC 3.51 (L) 4.22 - 5.81 MIL/uL   Hemoglobin 11.9 (L) 13.0 - 17.0 g/dL   HCT 38.1 (L) 39.0 - 52.0 %   MCV 108.5 (H) 78.0 - 100.0 fL   MCH 33.9 26.0 - 34.0 pg   MCHC 31.2 30.0 - 36.0 g/dL   RDW 19.4 (H) 11.5 - 15.5 %   Platelets 79 (L) 150 - 400 K/uL    Comment: PLATELET COUNT CONFIRMED BY SMEAR  I-stat troponin, ED     Status: None   Collection Time: 08/01/17  8:13 AM  Result Value Ref Range   Troponin i, poc 0.05 0.00 - 0.08 ng/mL   Comment 3            Comment: Due to the release kinetics of  cTnI, a negative result within the first hours of the onset of symptoms does not rule out myocardial infarction with certainty. If myocardial infarction is still suspected, repeat the test at appropriate intervals.   Protime-INR     Status: Abnormal   Collection Time: 08/01/17 10:02 AM  Result Value Ref Range   Prothrombin Time 33.0 (H) 11.4 - 15.2 seconds   INR 3.26   Brain natriuretic peptide     Status: Abnormal   Collection Time: 08/01/17 10:02 AM  Result Value Ref Range   B Natriuretic Peptide 1,426.5 (H) 0.0 - 100.0 pg/mL  I-Stat CG4 Lactic Acid, ED     Status: Abnormal   Collection Time: 08/01/17 10:11 AM  Result Value Ref Range   Lactic Acid, Venous 1.99 (H) 0.5 - 1.9 mmol/L  Cortisol     Status: None   Collection Time: 08/01/17 12:07 PM  Result Value Ref Range   Cortisol, Plasma 14.6 ug/dL  Comment: (NOTE) AM    6.7 - 22.6 ug/dL PM   <10.0       ug/dL   TSH     Status: Abnormal   Collection Time: 08/01/17 12:07 PM  Result Value Ref Range   TSH 8.689 (H) 0.350 - 4.500 uIU/mL    Comment: Performed by a 3rd Generation assay with a functional sensitivity of <=0.01 uIU/mL.  Vitamin B12     Status: Abnormal   Collection Time: 08/01/17 12:07 PM  Result Value Ref Range   Vitamin B-12 1,605 (H) 180 - 914 pg/mL    Comment: (NOTE) This assay is not validated for testing neonatal or myeloproliferative syndrome specimens for Vitamin B12 levels.   I-Stat CG4 Lactic Acid, ED     Status: None   Collection Time: 08/01/17 12:14 PM  Result Value Ref Range   Lactic Acid, Venous 1.78 0.5 - 1.9 mmol/L    ROS: as in hpi  Physical Exam: Vitals:   08/01/17 1445 08/01/17 1500  BP: (!) 68/57 (!) 79/58  Pulse: (!) 117 (!) 116  Resp: 19 20  Temp:    SpO2: 95% 93%     General: alert WM, NAD  , appropriate ,  HEENT: [place]  ,eomi , mmm Neck: no  Jvd, supple Heart: RRR, 1/6sem , no rub, galop Lungs: decr in base, nonlabored  Abdomen: bs +, soft , nt, nd Extremities: no pedal  edema Skin: no rash , L antecubital area of AVF mildly swollen slightly erythematous  No discharge  Neuro: in bed ox4, moves all extrem, no acute focal deficits appreciated  Dialysis Access: Pos bruit LUA AVF Not developed, yet  R ij perm cath   Dialysis Orders: Center:[facility]   on MWF . EDW 77 HD Bath 2k 3ca  Time 4hr  Heparin NONE. Access LUA AVF (07/19/17 insert , RIJ P. Cath )      Venofer 50 mg weekly hd    Assessment/Plan 1. Hypotension /chronic ,slightly more severe and symptomatic dizziness= Despite on TID Midodrine , cards consulting , with ho A Flutter see below , ?? Element of infection , admit team wu with cortisol level , BC  TTE  Also has severe pulm HTN and suspect an element of RHF.  One question is can dry weight be increased? Will follow.  2. ESRD -  HD  MWF Schedule , labs and vol ok , emergent pts tonight so hd in am   3. New lua avf mid cellulitis = IV Abx  Per admit  4. Persistent A flutter, with failed ablation and unable to take amiodarone due to lung disease.  Difficult to control rate due to hypotension and cannot increase BB = Cards seeing , on coumadin with hx of GI bleed has been back on Coumadin  5. Anemia of ESRD -hgb 11.9 no esa as op 6. Metabolic bone disease -  Op corrected ca  8.1  using added ca bath on HD , no binder  Fu ca , phos , alb labs  7. CAD  Sp CABG/ DES - Card s seeing  8. COPD- on chronic home o2 and  [place] and prn Albuterol   Ernest Haber, PA-C [facility] Beeper [phone] 08/01/2017, 3:38 PM   Pt seen, examined, agree w assess/plan as above with additions as indicated.  Kelly Splinter MD [employer] pager [phone]    cell [phone] 08/02/2017, 10:39 AM

## 2017-08-01 NOTE — ED Notes (Signed)
Patient returned from Ultrasound. 

## 2017-08-01 NOTE — Progress Notes (Signed)
ANTICOAGULATION CONSULT NOTE - Initial Consult  Pharmacy Consult for Warfarin Indication: atrial fibrillation  Allergies  Allergen Reactions  . Adhesive [Tape] Rash    Please use paper tape  . Amoxicillin Rash    migraine  . Ativan [Lorazepam] Anxiety  . Bupropion Anxiety  . Diphenhydramine Itching and Anxiety    Only with IV doses. Tolerates oral.  . Penicillins Other (See Comments)    MIGRAINE  Has patient had a PCN reaction causing immediate rash, facial/tongue/throat swelling, SOB or lightheadedness with hypotension: no Has patient had a PCN reaction causing severe rash involving mucus membranes or skin necrosis: No Has patient had a PCN reaction that required hospitalization No Has patient had a PCN reaction occurring within the last 10 years: No If all of the above answers are "NO", then may proceed with Cephalosporin use.    Patient Measurements: Height: 5\' 10"  (177.8 cm) Weight: 170 lb (77.1 kg) IBW/kg (Calculated) : 73 Heparin Dosing Weight: N/a   Vital Signs: Temp: 97.7 F (36.5 C) (11/26 0753) Temp Source: Oral (11/26 0753) BP: 73/55 (11/26 1110) Pulse Rate: 113 (11/26 1110)  Labs: Recent Labs    08/01/17 0801 08/01/17 1002  HGB 11.9*  --   HCT 38.1*  --   PLT 79*  --   LABPROT  --  33.0*  INR  --  3.26  CREATININE 6.43*  --     Estimated Creatinine Clearance: 12.3 mL/min (A) (by C-G formula based on SCr of 6.43 mg/dL (H)).   Medical History: Past Medical History:  Diagnosis Date  . Acute blood loss anemia    a. 02/2016 due to groin hematoma. Rec 3 U PRBC.  Marland Kitchen Anemia   . Anxiety   . Aortic heart valve prolapse 04/2013   a. s/p bioprosthetic AVR at time of CABG.  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 Bifascicular block   . CAD (coronary artery disease) 04/2013   a. 04/2013: s/p CABG x 2 (Y SVG -LAD & D2). b. 01/2015: NSTEMI s/p DES to LCx, BMS to SVG-LAD. c. NSTEMI 05/2015 during AF/AFL - non-flow limiting FFR. d. Low risk nuc  3/17. e. STEMI 6/17 after coming off Plavix, s/p DES to SVG-LAD into native vessel.  . Cancer ([place])    lung cancer - 5 doses of radiation, F/U in Dec. 2018  . Carotid artery disease (HCC)    a. 1-39% stenosis bilaterally in 06/2015.   Marland Kitchen CHF (congestive heart failure) ([place])   . Chronic respiratory failure ([place])   . COPD (chronic obstructive pulmonary disease) ([place])   . Dyspnea   . 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 b. He had a renal Tx from 2008 to 2011, but subsequent rejection - Gets HD in [place], [place] on MWF schedule.    Marland Kitchen GERD (gastroesophageal reflux disease)   . Hematoma    a. Large right groin hematoma after cath 02/2016 with associated ABL anemia.  . Hypertension   . Multiple sclerosis ([place])   . Myocardial infarction ([place])   . Nocturnal hypoxemia   . On home oxygen therapy    pt states he has not been wearing it  . PAF (paroxysmal atrial fibrillation) ([place])    a. h/o, placed on amiodarone 07/2016 due to recurrence.  . Paroxysmal atrial flutter ([place])    a. During 05/2015 admission - SVT, atrial flutter, and PAF.  Marland Kitchen Peripheral vascular disease ([place])    Cath in  01/2015 required 45 cm Destination Sheath  . Pneumonia   . Renal transplant failure and rejection   . 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 intolerant of bipap. Sleeps with O2 at 2 L  . SVT (supraventricular tachycardia) ([place])   . Valvular heart disease    a. 2D Echo 03/25/16: mild LVH, EF 50-55%, grade 2 Dd, AVR present with mild AI, mod MR, severely dilated LA, mildly dilated RV, mod RAE, PASP 72.    Medications:   (Not in a hospital admission)  Assessment: 84 YOM here with dizziness and SOB. Pharmacy consulted to resume home warfarin therapy for h/o AFib. INR on admission is supratherapeutic at 3.26. H/H low. Plt 79k. Last dose of warfarin was yesterday.   Home warfarin dose: 7.5 mg on Mon; 3.75 mg on all other days.   Goal of  Therapy:  INR 2-3 Monitor platelets by anticoagulation protocol: Yes   Plan:  -Hold warfarin today -Daily PT/INR  Albertina Parr, PharmD., BCPS Clinical Pharmacist Pager [phone]

## 2017-08-01 NOTE — H&P (View-Only) (Signed)
Hospital Consult    Reason for Consult:  Possible L arm surgical wound infection Requesting Physician:  Dr. Oleta Mouse MRN #:  [id number]  History of Present Illness: This is a 61 y.o. male who is seen in consultation for suspected infection of L AC fossa surgical wound s/p L brachiocephalic fistula creation by Dr. Oneida Alar on 07/19/17.  He presented to the ED with dizziness, SOB, and hypotension which has been worsening since fistula was created 3 weeks ago.  PMH also significant for COPD requiring 2L home O2, PAF therapeutic on coumadin, CAD with history of CABG, and CHF.    He denies any drainage from L arm incision.  Redness had become apparent over the past 24 hours.  Last completed HD treatment was Friday.  Patient also denies abd pain or tightness or bloody stools.  Past Medical History:  Diagnosis Date  . Acute blood loss anemia    a. 02/2016 due to groin hematoma. Rec 3 U PRBC.  Marland Kitchen Anemia   . Anxiety   . Aortic heart valve prolapse 04/2013   a. s/p bioprosthetic AVR at time of CABG.  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 Bifascicular block   . CAD (coronary artery disease) 04/2013   a. 04/2013: s/p CABG x 2 (Y SVG -LAD & D2). b. 01/2015: NSTEMI s/p DES to LCx, BMS to SVG-LAD. c. NSTEMI 05/2015 during AF/AFL - non-flow limiting FFR. d. Low risk nuc 3/17. e. STEMI 6/17 after coming off Plavix, s/p DES to SVG-LAD into native vessel.  . Cancer ([place])    lung cancer - 5 doses of radiation, F/U in Dec. 2018  . Carotid artery disease ([facility])    a. 1-39% stenosis bilaterally in 06/2015.   Marland Kitchen CHF (congestive heart failure) ([place])   . Chronic respiratory failure ([place])   . COPD (chronic obstructive pulmonary disease) ([place])   . Dyspnea   . 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 b. He had a renal Tx from 2008 to 2011, but subsequent rejection - Gets HD in [place], [place] on MWF schedule.    Marland Kitchen GERD (gastroesophageal reflux disease)   . Hematoma    a. Large right groin hematoma after cath 02/2016 with associated ABL anemia.  . Hypertension   . Multiple sclerosis ([place])   . Myocardial infarction ([place])   . Nocturnal hypoxemia   . On home oxygen therapy    pt states he has not been wearing it  . PAF (paroxysmal atrial fibrillation) ([place])    a. h/o, placed on amiodarone 07/2016 due to recurrence.  . Paroxysmal atrial flutter ([place])    a. During 05/2015 admission - SVT, atrial flutter, and PAF.  Marland Kitchen Peripheral vascular disease ([place])    Cath in 01/2015 required 45 cm Destination Sheath  . Pneumonia   . Renal transplant failure and rejection   . 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 intolerant of bipap. Sleeps with O2 at 2 L  . SVT (supraventricular tachycardia) ([place])   . Valvular heart disease    a. 2D Echo 03/25/16: mild LVH, EF 50-55%, grade 2 Dd, AVR present with mild AI, mod MR, severely dilated LA, mildly dilated RV, mod RAE, PASP 72.    Past Surgical History:  Procedure Laterality Date  . APPENDECTOMY    . AV FISTULA PLACEMENT    .  AV FISTULA PLACEMENT Left 07/19/2017   Procedure: LEFT BRACHIOCEPHALIC ARTERIOVENOUS FISTULA CREATION;  Surgeon: Elam Dutch, MD;  Location: [place];  Service: Vascular;  Laterality: Left;  . CARDIAC CATHETERIZATION N/A 05/26/2015   Procedure: Left Heart Cath and Coronary Angiography;  Surgeon: Leonie Man, MD;  Location: [place] CV LAB;  Service: Cardiovascular;  Laterality: N/A;  . CARDIAC CATHETERIZATION N/A 05/26/2015   Procedure: Intravascular Pressure Wire/FFR Study;  Surgeon: Leonie Man, MD;  Location: [facility] LAB;  Service: Cardiovascular;  Laterality: N/A;  . CARDIAC CATHETERIZATION N/A 02/15/2016   Procedure: Left Heart Cath and Coronary Angiography;  Surgeon: Wellington Hampshire, MD;  Location: [place] CV LAB;  Service: Cardiovascular;  Laterality: N/A;  . CARDIOVERSION N/A 07/12/2016   Procedure: CARDIOVERSION;  Surgeon: Minus Breeding, MD;  Location: [facility] ENDOSCOPY;  Service: Cardiovascular;  Laterality: N/A;  . CARDIOVERSION N/A 11/11/2016   Procedure: CARDIOVERSION;  Surgeon: Fay Records, MD;  Location: [facility];  Service: Cardiovascular;  Laterality: N/A;  . CARDIOVERSION N/A 12/09/2016   Procedure: CARDIOVERSION;  Surgeon: Sanda Klein, MD;  Location: [facility] ENDOSCOPY;  Service: Cardiovascular;  Laterality: N/A;  . COLONOSCOPY    . CORONARY ANGIOPLASTY    . CORONARY ARTERY BYPASS GRAFT    . ESOPHAGOGASTRODUODENOSCOPY ENDOSCOPY    . excised squamous cells at rectum  2006  . flash     flash pulmonary edema  . HERNIA REPAIR  [id number]   umbilical hernia  . KIDNEY TRANSPLANT  08/2007  . KNEE SURGERY    . LEFT HEART CATHETERIZATION WITH CORONARY ANGIOGRAM N/A 01/09/2013   Procedure: LEFT HEART CATHETERIZATION WITH CORONARY ANGIOGRAM;  Surgeon: Burnell Blanks, MD;  Location: [facility] C[facility] LAB;  Service: Cardiovascular;  Laterality: N/A;  . PARATHYROIDECTOMY    . TEE WITHOUT CARDIOVERSION N/A 11/11/2016   Procedure: TRANSESOPHAGEAL ECHOCARDIOGRAM (TEE);  Surgeon: Fay Records, MD;  Location: [place];  Service: Cardiovascular;  Laterality: N/A;  . TEE WITHOUT CARDIOVERSION N/A 12/09/2016   Procedure: TRANSESOPHAGEAL ECHOCARDIOGRAM (TEE);  Surgeon: Sanda Klein, MD;  Location[facility] ENDOSCOPY;  Service: Cardiovascular;  Laterality: N/A;    Allergies  Allergen Reactions  . Adhesive [Tape] Rash    Please use paper tape  . Amoxicillin Rash    migraine  . Ativan [Lorazepam] Anxiety  . Bupropion Anxiety  . Diphenhydramine Itching and Anxiety    Only with IV doses. Tolerates oral.  . Penicillins Other (See Comments)    MIGRAINE  Has patient had a PCN reaction causing immediate rash, facial/tongue/throat swelling, SOB or lightheadedness with hypotension: no Has patient had a PCN reaction causing severe rash involving mucus membranes or skin necrosis: No Has patient had a PCN reaction that required hospitalization  No Has patient had a PCN reaction occurring within the last 10 years: No If all of the above answers are "NO", then may proceed with Cephalosporin use.    Prior to Admission medications   Medication Sig Start Date End Date Taking? Authorizing Provider  acetaminophen (TYLENOL) 500 MG tablet Take 1,000 mg every 6 (six) hours as needed by mouth for mild pain.    Yes [provider]  albuterol (ACCUNEB) 1.25 MG/3ML nebulizer solution Take 3 mLs (1.25 mg total) by nebulization every 6 (six) hours as needed for wheezing. 06/16/17  Yes Tanda Rockers, MD  albuterol (PROAIR HFA) 108 (90 Base) MCG/ACT inhaler Inhale 2 puffs every 6 (six) hours as needed into the lungs for wheezing or shortness of breath. 07/19/17  Yes Rhyne, Samantha J, PA-C  atorvastatin (LIPITOR) 40 MG tablet Take 0.5 tablets (20 mg total) by mouth at bedtime. 05/06/17  Yes Burnell Blanks, MD  calcium acetate (PHOSLO) 667 MG capsule Take 4,002 mg by mouth 3 (three) times daily with meals.    Yes [provider]  citalopram (CELEXA) 20 MG tablet Take 20 mg by mouth at bedtime.    Yes [provider]  diphenhydrAMINE (BENADRYL) 2 % cream Apply 1 application topically daily as needed for itching.   Yes [provider]  docusate sodium (COLACE) 100 MG capsule Take 100 mg by mouth 2 (two) times daily.    Yes [provider]  folic acid (FOLVITE) 1 MG tablet Take 1 mg by mouth daily.    Yes [provider]  gabapentin (NEURONTIN) 300 MG capsule Take 300 mg by mouth at bedtime. Patient taking two 300mg  capsules at bedtime sometime.   Yes [provider]  metoprolol tartrate (LOPRESSOR) 25 MG tablet Take 12.5 mg by mouth 2 (two) times daily.   Yes [provider]  midodrine (PROAMATINE) 10 MG tablet Take 10 mg by mouth 3 (three) times daily.   Yes [provider]  multivitamin (RENA-VIT) TABS tablet Take 1 tablet by mouth daily. 01/11/13  Yes Samella Parr, NP  OXYGEN 2lpm with sleep and "at home"   Yes [provider]  oxymetazoline (AFRIN) 0.05 % nasal spray Place 1 spray into both nostrils 2 (two) times daily as needed for congestion.   Yes [provider]  pantoprazole (PROTONIX) 40 MG tablet Take 1 tablet (40 mg total) by mouth 2 (two) times daily. 02/09/17  Yes Burnell Blanks, MD  polyethylene glycol (MIRALAX / GLYCOLAX) packet Take 17 g daily as needed by mouth for mild constipation. Mix in 8 oz liquid and drink    Yes [provider]  STIOLTO RESPIMAT 2.5-2.5 MCG/ACT AERS INHALE 2 PUFFS BY MOUTH DAILY 07/06/17  Yes [provider]  warfarin (COUMADIN) 7.5 MG tablet Take 0.5-1 tablets (3.75-7.5 mg total) See admin instructions by mouth. Patient taking differently: Take 3.75-7.5 mg by mouth See admin instructions. On Monday (only) he takes one tablet 7.5mg . 07/19/17  Yes Rhyne, Hulen Shouts, PA-C  nitroGLYCERIN (NITROSTAT) 0.4 MG SL tablet Place 0.4 mg under the tongue every 5 (five) minutes as needed for chest pain (max 3 doses - if no relief call MD).     [provider]  oxyCODONE (ROXICODONE) 5 MG immediate release tablet Take 1 tablet (5 mg total) every 6 (six) hours as needed by mouth. 07/19/17   Rhyne, Hulen Shouts, PA-C    Social History   Socioeconomic History  . Marital status: Married    Spouse name: Not on file  . Number of children: Not on file  . Years of education: Not on file  . Highest education level: Not on file  Social Needs  . Financial resource strain: Not on file  . Food insecurity - worry: Not on file  . Food insecurity - inability: Not on file  . Transportation needs - medical: Not on file  . Transportation needs - non-medical: Not on file  Occupational History  . Occupation: Disabled  Tobacco Use  . Smoking status: Former Smoker    Packs/day: 2.00    Years: 20.00    Pack years: 40.00    Types: Cigarettes    Last attempt to quit: 08/06/1998    Years since  quitting: 19.0  .  Smokeless tobacco: Never Used  Substance and Sexual Activity  . Alcohol use: No    Alcohol/week: 0.0 oz  . Drug use: No  . Sexual activity: Not on file  Other Topics Concern  . Not on file  Social History Narrative   No history of premature CAD in either parents or siblings. However, his maternal grandfather and 2 maternal uncles had coronary artery disease.     Family History  Problem Relation Age of Onset  . Heart failure Mother        started in 82s  . Emphysema Mother        smoked  . Diabetes Father   . Lupus Sister   . Heart attack Maternal Grandfather   . Heart attack Maternal Uncle   . Heart attack Maternal Aunt   . Hypertension Maternal Aunt   . Stroke Neg Hx     ROS: Otherwise negative unless mentioned in HPI  Physical Examination  Vitals:   08/01/17 1645 08/01/17 1700  BP: (!) 71/58 (!) 82/55  Pulse: (!) 114 (!) 116  Resp: 16 19  Temp:    SpO2: 93% 94%   Body mass index is 24.39 kg/m.  General:  WDWN in NAD Gait: Not observed HENT: WNL, normocephalic Pulmonary: normal non-labored breathing, without Rales, rhonchi,  wheezing Cardiac: tachycardia, without  Murmurs, rubs or gallops; without carotid bruits Chest: R IJ PermCath without purulence, surrounding erythema, or obvious infection Abdomen: soft, NT/ND, no masses Skin: boil noted L upper back with small area of fluctuance Vascular Exam/Pulses: feet warm to touch with good capillary refill despite lack of pedal pulses Extremities: without ischemic changes, without Gangrene , L AC fossa incision with dry eschar and mild erythema locally surround incision; palpable area of fluctuance consistent with seroma; palpable thrill and audible bruit through L caphalic vein which is visibly maturing Musculoskeletal: no muscle wasting or atrophy  Neurologic: A&O X 3;  No focal weakness or paresthesias are detected; speech is fluent/normal Psychiatric:  The pt has Normal affect. Lymph:   Unremarkable  CBC    Component Value Date/Time   WBC 6.6 08/01/2017 0801   RBC 3.51 (L) 08/01/2017 0801   HGB 11.9 (L) 08/01/2017 0801   HGB 12.8 (L) 12/15/2016 1558   HCT 38.1 (L) 08/01/2017 0801   HCT 39.1 12/15/2016 1558   PLT 79 (L) 08/01/2017 0801   PLT 139 (L) 12/15/2016 1558   MCV 108.5 (H) 08/01/2017 0801   MCV 101 (H) 12/15/2016 1558   MCH 33.9 08/01/2017 0801   MCHC 31.2 08/01/2017 0801   RDW 19.4 (H) 08/01/2017 0801   RDW 15.8 (H) 12/15/2016 1558   LYMPHSABS 0.5 (L) 12/15/2016 1558   MONOABS 0.7 11/15/2016 1550   EOSABS 0.1 12/15/2016 1558   BASOSABS 0.0 12/15/2016 1558    BMET    Component Value Date/Time   NA 138 08/01/2017 0801   NA 141 12/15/2016 1558   K 4.8 08/01/2017 0801   CL 101 08/01/2017 0801   CO2 23 08/01/2017 0801   GLUCOSE 114 (H) 08/01/2017 0801   BUN 41 (H) 08/01/2017 0801   BUN 31 (H) 12/15/2016 1558   CREATININE 6.43 (H) 08/01/2017 0801   CALCIUM 6.7 (L) 08/01/2017 0801   GFRNONAA 8 (L) 08/01/2017 0801   GFRAA 10 (L) 08/01/2017 0801    COAGS: Lab Results  Component Value Date   INR 3.26 08/01/2017   INR 1.8 07/22/2017   INR 1.85 07/19/2017     Non-Invasive Vascular Imaging:  Patent L brachiocephalic fistula Fluid collection in [facility] fossa  Statin:  No. Beta Blocker:  Yes.   Aspirin:  No. ACEI:  No. ARB:  No. CCB use:  No Other antiplatelets/anticoagulants:  Yes.   coumadin   ASSESSMENT/PLAN: This is a 61 y.o. male with suspected L AC fossa surgical wound infection  Agree with blood cultures and initiation of antibiotic therapy Local redness and fluctuance noted at [facility] fossa incision however it is unlikely to be etiology of patient's systemic symptoms; exam consistent with post operative seroma Continue HD from PermCath We will follow incision while patient is admitted Dr. Donnetta Hutching to evaluate patient later today  Dagoberto Ligas PA-C [facility] [phone]  I have examined the patient, reviewed and  agree with above.  Excellent early maturation of his fistula created 2 weeks ago.  Does have some serous fluid at the antecubital wound but mild erythema as expected 2 weeks out and no evidence of infection.  Do not feel this would be the cause of his hypotension.  Will follow up with Dr. Oneida Alar as an outpatient as planned.  Curt Jews, MD 08/01/2017 7:27 PM

## 2017-08-01 NOTE — ED Provider Notes (Signed)
[facility] EMERGENCY DEPARTMENT Provider Note   CSN: [id number] Arrival date & time: 08/01/17  7353     History   Chief Complaint Chief Complaint  Patient presents with  . Shortness of Breath  . Dizziness    HPI [name] is a 61 y.o. male.  HPI 61 year old male who presents with dizziness and shortness of breath.  Symptoms have been ongoing for 3 weeks and worsening.  He has a history of end-stage renal disease on hemodialysis, COPD on 2 L home oxygen, paroxysmal atrial fibrillation on Coumadin, and CAD with two-vessel CABG.  Also with CHF and EF of 45-50% and bioprosthetic AVR.  He had an AV fistula placed in the left upper extremity 3 weeks ago and since then he has had gradually worsening shortness of breath, dizziness/lightheadedness.  Symptoms primarily occur with activity, improved with rest.  He has not had syncope, chest pain, lower extremity edema, orthopnea or PND.  He has not had fevers or chills, vomiting or diarrhea, or any urinary complaints and does not normally make urine.  Has had cough not particularly productive of sputum.  Went for routine dialysis today, but was sent to the ED for evaluation.  Family has also noted that his blood pressure has been running on the lower side during this time.  He denies any melena or hematochezia.   Past Medical History:  Diagnosis Date  . Acute blood loss anemia    a. 02/2016 due to groin hematoma. Rec 3 U PRBC.  Marland Kitchen Anemia   . Anxiety   . Aortic heart valve prolapse 04/2013   a. s/p bioprosthetic AVR at time of CABG.  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 Bifascicular block   . CAD (coronary artery disease) 04/2013   a. 04/2013: s/p CABG x 2 (Y SVG -LAD & D2). b. 01/2015: NSTEMI s/p DES to LCx, BMS to SVG-LAD. c. NSTEMI 05/2015 during AF/AFL - non-flow limiting FFR. d. Low risk nuc 3/17. e. STEMI 6/17 after coming off Plavix, s/p DES to SVG-LAD into native vessel.  . Cancer ([place])    lung  cancer - 5 doses of radiation, F/U in Dec. 2018  . Carotid artery disease (HCC)    a. 1-39% stenosis bilaterally in 06/2015.   Marland Kitchen CHF (congestive heart failure) ([place])   . Chronic respiratory failure ([place])   . COPD (chronic obstructive pulmonary disease) ([place])   . Dyspnea   . 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 b. He had a renal Tx from 2008 to 2011, but subsequent rejection - Gets HD in [place], [place] on MWF schedule.    Marland Kitchen GERD (gastroesophageal reflux disease)   . Hematoma    a. Large right groin hematoma after cath 02/2016 with associated ABL anemia.  . Hypertension   . Multiple sclerosis ([place])   . Myocardial infarction ([place])   . Nocturnal hypoxemia   . On home oxygen therapy    pt states he has not been wearing it  . PAF (paroxysmal atrial fibrillation) ([place])    a. h/o, placed on amiodarone 07/2016 due to recurrence.  . Paroxysmal atrial flutter ([place])    a. During 05/2015 admission - SVT, atrial flutter, and PAF.  Marland Kitchen Peripheral vascular disease ([place])    Cath in 01/2015 required 45 cm Dest[place]  . Pneumonia   . Renal transplant failure and rejection   . S/P CABG x 2 04/2013  s/p CABG x 2 (Y SVG -LAD & D2);   Marland Kitchen Sleep apnea    a. intolerant of bipap. Sleeps with O2 at 2 L  . SVT (supraventricular tachycardia) ([place])   . Valvular heart disease    a. 2D Echo 03/25/16: mild LVH, EF 50-55%, grade 2 Dd, AVR present with mild AI, mod MR, severely dilated LA, mildly dilated RV, mod RAE, PASP 72.    Patient Active Problem List   Diagnosis Date Noted  . Acute pulmonary edema ([place]) 12/06/2016  . CAP (community acquired pneumonia) 11/15/2016  . Acute respiratory failure with hypoxia ([place]) 11/15/2016  . COPD exacerbation ([place]) 11/15/2016  . Long term current use of amiodarone 09/28/2016  . Mixed hyperlipidemia 07/12/2016  . Restless leg syndrome 07/12/2016  . CHF (congestive heart failure) ([place]) 07/09/2016  . Coronary artery disease  07/09/2016  . Acute CHF ([place]) 07/09/2016  . Gastrointestinal hemorrhage 06/16/2016  . COPD GOLD III  03/24/2016  . Elevated troponin 03/24/2016  . Chronic diastolic congestive heart failure ([place]) 03/24/2016  . SOB (shortness of breath) 03/24/2016  . Left leg swelling   . Macrocytic anemia   . Acute blood loss anemia 02/19/2016  . Groin hematoma 02/19/2016  . NSVT (nonsustained ventricular tachycardia) ([place]) 02/19/2016  . Bifascicular block   . Hypoxia   . Chronic anticoagulation-Coumadin 02/15/2016  . H/O aortic valve replacement 02/15/2016  . Hx of CABG x 2 2014 02/15/2016  . Acute ST elevation myocardial infarction (STEMI) ([place]) 02/15/2016  . Hemorrhoids 10/09/2015  . Recurrent major depressive disorder, in full remission ([place]) 10/09/2015  . Encounter for therapeutic drug monitoring 06/04/2015  . Paroxysmal atrial flutter ([place]) 06/04/2015  . Supraventricular tachycardia ([place]) 06/04/2015  . Anemia   . Recurrent coronary arteriosclerosis after percutaneous transluminal coronary angioplasty   . Typical atrial flutter ([place]) 05/21/2015  . Paroxysmal atrial fibrillation ([place]) 02/21/2015  . CAD of artery bypass graft-occluded SVG-LAD 02/15/16   . Hypertension   . Acute on chronic kidney failure ([place]) 05/01/2013  . Metabolic acidosis 92/33/0076  . Chronic constipation 04/20/2013  . 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 Constipation, chronic 04/20/2013  . 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 Periodontal disease 04/20/2013  . Abscess of aortic root 04/19/2013  . 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 Non-ST elevation MI (NSTEMI) ([place]) 01/09/2013  . Obstructive sleep apnea syndrome 01/09/2013  . Aortic valve disease 12/15/2012  . GPC Bacteremia 11/14/2012  . Neck pain 05/18/2012  . Kidney transplant failure 02/14/2012  . Multiple sclerosis ([place]) 02/14/2012  . Thrombocytopenia ([place]) 02/12/2012  . Hypotension 02/12/2012  . Syncope and collapse 02/12/2012  .  Aortic valve insufficiency 02/12/2012  . End-stage renal disease ([place]) 02/12/2012  . Left-sided heart failure ([place]) 01/19/2011    Past Surgical History:  Procedure Laterality Date  . APPENDECTOMY    . AV FISTULA PLACEMENT    . AV FISTULA PLACEMENT Left 07/19/2017   Procedure: LEFT BRACHIOCEPHALIC ARTERIOVENOUS FISTULA CREATION;  Surgeon: Elam Dutch, MD;  Location: [facility];  Service: Vascular;  Laterality: Left;  . CARDIAC CATHETERIZATION N/A 05/26/2015   Procedure: Left Heart Cath and Coronary Angiography;  Surgeon: Leonie Man, MD;  Location: [facility] CV LAB;  Service: Cardiovascular;  Laterality: N/A;  . CARDIAC CATHETERIZATION N/A 05/26/2015   Procedure: Intravascular Pressure Wire/FFR Study;  Surgeon: Leonie Man, MD;  Location: [facility] CV LAB;  Service: Cardiovascular;  Laterality: N/A;  . CARDIAC CATHETERIZATION N/A 02/15/2016   Procedure: Left Heart Cath and Coronary Angiography;  Surgeon:  Wellington Hampshire, MD;  Location: [facility] CV LAB;  Service: Cardiovascular;  Laterality: N/A;  . CARDIOVERSION N/A 07/12/2016   Procedure: CARDIOVERSION;  Surgeon: Minus Breeding, MD;  Location[facility] ENDOSCOPY;  Service: Cardiovascular;  Laterality: N/A;  . CARDIOVERSION N/A 11/11/2016   Procedure: CARDIOVERSION;  Surgeon: Fay Records, MD;  Location: [facility];  Service: Cardiovascular;  Laterality: N/A;  . CARDIOVERSION N/A 12/09/2016   Procedure: CARDIOVERSION;  Surgeon: Sanda Klein, MD;  Location: [facility] ENDOSCOPY;  Service: Cardiovascular;  Laterality: N/A;  . COLONOSCOPY    . CORONARY ANGIOPLASTY    . CORONARY ARTERY BYPASS GRAFT    . ESOPHAGOGASTRODUODENOSCOPY ENDOSCOPY    . excised squamous cells at rectum  2006  . flash     flash pulmonary edema  . HERNIA REPAIR  [id number]/[id number]   umbilical hernia  . KIDNEY TRANSPLANT  08/2007  . KNEE SURGERY    . LEFT HEART CATHETERIZATION WITH CORONARY ANGIOGRAM N/A 01/09/2013   Procedure: LEFT HEART CATHETERIZATION WITH CORONARY ANGIOGRAM;   Surgeon: Burnell Blanks, MD;  Location: [facility] CATH LAB;  Service: Cardiovascular;  Laterality: N/A;  . PARATHYROIDECTOMY    . TEE WITHOUT CARDIOVERSION N/A 11/11/2016   Procedure: TRANSESOPHAGEAL ECHOCARDIOGRAM (TEE);  Surgeon: Fay Records, MD;  Location: [facility];  Service: Cardiovascular;  Laterality: N/A;  . TEE WITHOUT CARDIOVERSION N/A 12/09/2016   Procedure: TRANSESOPHAGEAL ECHOCARDIOGRAM (TEE);  Surgeon: Sanda Klein, MD;  Location: [facility] ENDOSCOPY;  Service: Cardiovascular;  Laterality: N/A;       Home Medications    Prior to Admission medications   Medication Sig Start Date End Date Taking? Authorizing Provider  acetaminophen (TYLENOL) 500 MG tablet Take 1,000 mg every 6 (six) hours as needed by mouth for mild pain.    Yes [provider]  albuterol (ACCUNEB) 1.25 MG/3ML nebulizer solution Take 3 mLs (1.25 mg total) by nebulization every 6 (six) hours as needed for wheezing. 06/16/17  Yes Tanda Rockers, MD  albuterol (PROAIR HFA) 108 (90 Base) MCG/ACT inhaler Inhale 2 puffs every 6 (six) hours as needed into the lungs for wheezing or shortness of breath. 07/19/17  Yes Rhyne, Hulen Shouts, PA-C  atorvastatin (LIPITOR) 40 MG tablet Take 0.5 tablets (20 mg total) by mouth at bedtime. 05/06/17  Yes Burnell Blanks, MD  calcium acetate (PHOSLO) 667 MG capsule Take 4,002 mg by mouth 3 (three) times daily with meals.    Yes [provider]  citalopram (CELEXA) 20 MG tablet Take 20 mg by mouth at bedtime.    Yes [provider]  diphenhydrAMINE (BENADRYL) 2 % cream Apply 1 application topically daily as needed for itching.   Yes [provider]  docusate sodium (COLACE) 100 MG capsule Take 100 mg by mouth 2 (two) times daily.    Yes [provider]  folic acid (FOLVITE) 1 MG tablet Take 1 mg by mouth daily.    Yes [provider]  gabapentin (NEURONTIN) 300 MG capsule Take 300 mg by mouth at bedtime. Patient taking two 300mg   capsules at bedtime sometime.   Yes [provider]  metoprolol tartrate (LOPRESSOR) 25 MG tablet Take 12.5 mg by mouth 2 (two) times daily.   Yes [provider]  midodrine (PROAMATINE) 10 MG tablet Take 10 mg by mouth 3 (three) times daily.   Yes [provider]  multivitamin (RENA-VIT) TABS tablet Take 1 tablet by mouth daily. 01/11/13  Yes Samella Parr, NP  OXYGEN 2lpm with sleep and "at home"  Yes [provider]  oxymetazoline (AFRIN) 0.05 % nasal spray Place 1 spray into both nostrils 2 (two) times daily as needed for congestion.   Yes [provider]  pantoprazole (PROTONIX) 40 MG tablet Take 1 tablet (40 mg total) by mouth 2 (two) times daily. 02/09/17  Yes Burnell Blanks, MD  polyethylene glycol (MIRALAX / GLYCOLAX) packet Take 17 g daily as needed by mouth for mild constipation. Mix in 8 oz liquid and drink    Yes [provider]  STIOLTO RESPIMAT 2.5-2.5 MCG/ACT AERS INHALE 2 PUFFS BY MOUTH DAILY 07/06/17  Yes [provider]  warfarin (COUMADIN) 7.5 MG tablet Take 0.5-1 tablets (3.75-7.5 mg total) See admin instructions by mouth. Patient taking differently: Take 3.75-7.5 mg by mouth See admin instructions. On Monday (only) he takes one tablet 7.5mg . 07/19/17  Yes Rhyne, Samantha J, PA-C  nitroGLYCERIN (NITROSTAT) 0.4 MG SL tablet Place 0.4 mg under the tongue every 5 (five) minutes as needed for chest pain (max 3 doses - if no relief call MD).     [provider]  oxyCODONE (ROXICODONE) 5 MG immediate release tablet Take 1 tablet (5 mg total) every 6 (six) hours as needed by mouth. 07/19/17   Rhyne, Hulen Shouts, PA-C  traZODone (DESYREL) 100 MG tablet Take 50 mg at bedtime by mouth. 06/28/17   [provider]    Family History Family History  Problem Relation Age of Onset  . Heart failure Mother        started in 58s  . Emphysema Mother        smoked  . Diabetes Father   . Lupus Sister   .  Heart attack Maternal Grandfather   . Heart attack Maternal Uncle   . Heart attack Maternal Aunt   . Hypertension Maternal Aunt   . Stroke Neg Hx     Social History Social History   Tobacco Use  . Smoking status: Former Smoker    Packs/day: 2.00    Years: 20.00    Pack years: 40.00    Types: Cigarettes    Last attempt to quit: 08/06/1998    Years since quitting: 19.0  . Smokeless tobacco: Never Used  Substance Use Topics  . Alcohol use: No    Alcohol/week: 0.0 oz  . Drug use: No     Allergies   Adhesive [tape]; Amoxicillin; Ativan [lorazepam]; Bupropion; Diphenhydramine; and Penicillins   Review of Systems Review of Systems  Constitutional: Positive for fatigue. Negative for fever.  Respiratory: Positive for cough and shortness of breath.   Cardiovascular: Negative for chest pain and leg swelling.  Gastrointestinal: Negative for abdominal pain, nausea and vomiting.  Neurological: Positive for light-headedness. Negative for syncope.  All other systems reviewed and are negative.    Physical Exam Updated Vital Signs BP (!) 73/55   Pulse (!) 113   Temp 97.7 F (36.5 C) (Oral)   Resp 14   Ht 5\' 10"  (1.778 m)   Wt 77.1 kg (170 lb)   SpO2 99%   BMI 24.39 kg/m   Physical Exam Physical Exam  Nursing note and vitals reviewed. Constitutional: Pale, chronically ill appearing, non-toxic, and in no acute distress Head: Normocephalic and atraumatic.  Mouth/Throat: Oropharynx is clear and moist.  Neck: Normal range of motion. Neck supple.  Cardiovascular: Tachycardic rate and irregularly irregular rhythm.   Pulmonary/Chest: Effort normal and breath sounds normal.  Abdominal: Soft. There is no tenderness. There is no rebound and no guarding.  Musculoskeletal:  No edema.  Neurological: Alert, no facial droop, fluent speech, moves all extremities symmetrically Skin: Skin is warm and dry.  AV fistula in left BC, mild surrounding induration, erythema, warmth Psychiatric:  Cooperative   ED Treatments / Results  Labs (all labs ordered are listed, but only abnormal results are displayed) Labs Reviewed  BASIC METABOLIC PANEL - Abnormal; Notable for the following components:      Result Value   Glucose, Bld 114 (*)    BUN 41 (*)    Creatinine, Ser 6.43 (*)    Calcium 6.7 (*)    GFR calc non Af Amer 8 (*)    GFR calc Af Amer 10 (*)    All other components within normal limits  CBC - Abnormal; Notable for the following components:   RBC 3.51 (*)    Hemoglobin 11.9 (*)    HCT 38.1 (*)    MCV 108.5 (*)    RDW 19.4 (*)    Platelets 79 (*)    All other components within normal limits  PROTIME-INR - Abnormal; Notable for the following components:   Prothrombin Time 33.0 (*)    All other components within normal limits  BRAIN NATRIURETIC PEPTIDE - Abnormal; Notable for the following components:   B Natriuretic Peptide 1,426.5 (*)    All other components within normal limits  I-STAT CG4 LACTIC ACID, ED - Abnormal; Notable for the following components:   Lactic Acid, Venous 1.99 (*)    All other components within normal limits  CORTISOL  TSH  I-STAT TROPONIN, ED  I-STAT CG4 LACTIC ACID, ED    EKG  EKG Interpretation  Date/Time:  Monday August 01 2017 07:44:00 EST Ventricular Rate:  114 PR Interval:    QRS Duration: 120 QT Interval:  354 QTC Calculation: 487 R Axis:   -119 Text Interpretation:  Atrial flutter with 2:1 A-V conduction Possible Right ventricular hypertrophy Inferior infarct , age undetermined Anterolateral infarct , age undetermined Abnormal ECG previously in sinus rhythm Confirmed by Brantley Stage [id number]) on 08/01/2017 9:13:04 AM       Radiology Dg Chest 2 View  Result Date: 08/01/2017 CLINICAL DATA:  Shortness of breath. EXAM: CHEST  2 VIEW COMPARISON:  06/16/2017 FINDINGS: There is a right chest port a catheter with tip in the projection of the cavoatrial junction. The heart size is moderately enlarged. Status post median  sternotomy and CABG procedure. The proximal sternotomy wire appears chronically fractured. Chronic interstitial changes are identified bilaterally with scarring in the perihilar left upper lobe and right base. No superimposed pulmonary edema, pleural effusion or scarring. IMPRESSION: 1. No acute cardiopulmonary abnormalities. 2. Chronic bilateral scarring. Electronically Signed   By: Kerby Moors M.D.   On: 08/01/2017 08:30    Procedures Procedures (including critical care time)  Medications Ordered in ED Medications  ceFAZolin (ANCEF) IVPB 1 g/50 mL premix (not administered)  sodium chloride 0.9 % bolus 500 mL (500 mLs Intravenous New Bag/Given 08/01/17 1001)  ipratropium-albuterol (DUONEB) 0.5-2.5 (3) MG/3ML nebulizer solution 3 mL (3 mLs Nebulization Given 08/01/17 1110)     Initial Impression / Assessment and Plan / ED Course  I have reviewed the triage vital signs and the nursing notes.  Pertinent labs & imaging results that were available during my care of the patient were reviewed by me and considered in my medical decision making (see chart for details).     Records are reviewed.  Patient was seen by cardiology in early October 2018.  At that time  there was concern for persistent A. fib that was poorly controlled by ablation in the past.  He has been taken off of amiodarone due to his chronic lung disease, is now only rate controlled.  He is chronically ill-appearing but in no acute distress.  Normal blood pressure for him in triage, normal systolic blood pressures 03U-13H.  However has been hypotensive here systolic blood pressures in the 60s in his ED room.  Is mentating normally.  Does not appear fluid overloaded.  Given 500 cc bolus, blood pressures in the 70s subsequently.  Continues to mentate well.  No obvious signs of sepsis, but he does have possible mild cellulitis involving the left AV fistula would not expect it to cause this degree of hypotension.  It is covered with  Ancef.  No signs of GI bleeding.  He has a stable hemoglobin.  No significant heart failure symptoms to suggest severe high output heart failure related to AV fistula. ? Possible adrenal insufficiency. No recent GI losses or decreased PO intake to suggest significant dehydration or hypovolemia. Unclear etiology of hypotension at this time.   Blood work does show elevated BNP, but this is baseline. Possible worsening heart failure. No leukocytosis. No hyperkalemia or other indications to suggest need to emergent dialysis.  Discussed with internal medicine teaching service who will admit for ongoing management.   Final Clinical Impressions(s) / ED Diagnoses   Final diagnoses:  Hypotension, unspecified hypotension type  DOE (dyspnea on exertion)  Near syncope    ED Discharge Orders    None       Forde Dandy, MD 08/01/17 1136

## 2017-08-01 NOTE — ED Notes (Addendum)
Date and time results received: 08/01/17 6:43 PM  Test: troponin Critical Value: 0.07  Name of Provider Notified: Dareen Piano, MD  No new orders

## 2017-08-01 NOTE — ED Triage Notes (Signed)
Per Pt, Pt is coming from home with complaints of dizziness since his fistula was placed three weeks ago. Pt has some SOB but reports hx of COPD. Alert and Oriented x4. Dialysis patient - MWF. Pt was not able to have it this morning because he was sent over from clinic.

## 2017-08-01 NOTE — ED Notes (Signed)
Pt states has been dizzy x a couple of weeks , is a MWF dialysis pt, shunt in rt arm , last one on friday was taken down to dry weight of 77 kg, pt is very sob with exertion and off his home o2 of 2 l [place] gets extremely sob, pts bp is low ans he and his wife states that when he was at [facility] he had A line and BP was higher that what cuff was reading Dr Oleta Mouse aware

## 2017-08-01 NOTE — ED Notes (Signed)
MD paged for elevated troponin level

## 2017-08-01 NOTE — ED Notes (Signed)
wife in room admit dr in to see pt

## 2017-08-01 NOTE — Progress Notes (Signed)
Duplex dialysis access prelim: Left AVF is patent. Fluid is noted surrounding AVF at [facility] fossa. Landry Mellow, RDMS, RVT

## 2017-08-01 NOTE — ED Notes (Signed)
Admit dr into see pt 

## 2017-08-01 NOTE — Consult Note (Addendum)
Hospital Consult    Reason for Consult:  Possible L arm surgical wound infection Requesting Physician:  Dr. Oleta Mouse MRN #:  [id number]  History of Present Illness: This is a 60 y.o. male who is seen in consultation for suspected infection of L AC fossa surgical wound s/p L brachiocephalic fistula creation by Dr. Oneida Alar on 07/19/17.  He presented to the ED with dizziness, SOB, and hypotension which has been worsening since fistula was created 3 weeks ago.  PMH also significant for COPD requiring 2L home O2, PAF therapeutic on coumadin, CAD with history of CABG, and CHF.    He denies any drainage from L arm incision.  Redness had become apparent over the past 24 hours.  Last completed HD treatment was Friday.  Patient also denies abd pain or tightness or bloody stools.  Past Medical History:  Diagnosis Date  . Acute blood loss anemia    a. 02/2016 due to groin hematoma. Rec 3 U PRBC.  Marland Kitchen Anemia   . Anxiety   . Aortic heart valve prolapse 04/2013   a. s/p bioprosthetic AVR at time of CABG.  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 Bifascicular block   . CAD (coronary artery disease) 04/2013   a. 04/2013: s/p CABG x 2 (Y SVG -LAD & D2). b. 01/2015: NSTEMI s/p DES to LCx, BMS to SVG-LAD. c. NSTEMI 05/2015 during AF/AFL - non-flow limiting FFR. d. Low risk nuc 3/17. e. STEMI 6/17 after coming off Plavix, s/p DES to SVG-LAD into native vessel.  . Cancer ([place])    lung cancer - 5 doses of radiation, F/U in Dec. 2018  . Carotid artery disease (H[facility])    a. 1-39% stenosis bilaterally in 06/2015.   Marland Kitchen CHF (congestive heart failure) ([place])   . Chronic respiratory failure ([place])   . COPD (chronic obstructive pulmonary disease) ([place])   . Dyspnea   . 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 b. He had a renal Tx from 2008 to 2011, but subsequent rejection - Gets HD in [place], [place] on MWF schedule.    Marland Kitchen GERD (gastroesophageal reflux disease)   . Hematoma    a. Large right groin hematoma after cath 02/2016 with associated ABL anemia.  . Hypertension   . Multiple sclerosis ([place])   . Myocardial infarction ([place])   . Nocturnal hypoxemia   . On home oxygen therapy    pt states he has not been wearing it  . PAF (paroxysmal atrial fibrillation) ([place])    a. h/o, placed on amiodarone 07/2016 due to recurrence.  . Paroxysmal atrial flutter ([place])    a. During 05/2015 admission - SVT, atrial flutter, and PAF.  Marland Kitchen Peripheral vascular disease ([place])    Cath in 01/2015 required 45 cm Destination Sheath  . Pneumonia   . Renal transplant failure and rejection   . 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 intolerant of bipap. Sleeps with O2 at 2 L  . SVT (supraventricular tachycardia) ([place])   . Valvular heart disease    a. 2D Echo 03/25/16: mild LVH, EF 50-55%, grade 2 Dd, AVR present with mild AI, mod MR, severely dilated LA, mildly dilated RV, mod RAE, PASP 72.    Past Surgical History:  Procedure Laterality Date  . APPENDECTOMY    . AV FISTULA PLACEMENT    .  AV FISTULA PLACEMENT Left 07/19/2017   Procedure: LEFT BRACHIOCEPHALIC ARTERIOVENOUS FISTULA CREATION;  Surgeon: Elam Dutch, MD;  Location: [facility];  Service: Vascular;  Laterality: Left;  . CARDIAC CATHETERIZATION N/A 05/26/2015   Procedure: Left Heart Cath and Coronary Angiography;  Surgeon: Leonie Man, MD;  Location: [facility] CV LAB;  Service: Cardiovascular;  Laterality: N/A;  . CARDIAC CATHETERIZATION N/A 05/26/2015   Procedure: Intravascular Pressure Wire/FFR Study;  Surgeon: Leonie Man, MD;  Location: [facility] CV LAB;  Service: Cardiovascular;  Laterality: N/A;  . CARDIAC CATHETERIZATION N/A 02/15/2016   Procedure: Left Heart Cath and Coronary Angiography;  Surgeon: Wellington Hampshire, MD;  Location: [facility] CV LAB;  Service: Cardiovascular;  Laterality: N/A;  . CARDIOVERSION N/A 07/12/2016   Procedure: CARDIOVERSION;  Surgeon: Minus Breeding, MD;  Location: [facility] ENDOSCOPY;  Service: Cardiovascular;  Laterality: N/A;  . CARDIOVERSION N/A 11/11/2016   Procedure: CARDIOVERSION;  Surgeon: Fay Records, MD;  Location: [facility];  Service: Cardiovascular;  Laterality: N/A;  . CARDIOVERSION N/A 12/09/2016   Procedure: CARDIOVERSION;  Surgeon: Sanda Klein, MD;  Location: [facility] ENDOSCOPY;  Service: Cardiovascular;  Laterality: N/A;  . COLONOSCOPY    . CORONARY ANGIOPLASTY    . CORONARY ARTERY BYPASS GRAFT    . ESOPHAGOGASTRODUODENOSCOPY ENDOSCOPY    . excised squamous cells at rectum  2006  . flash     flash pulmonary edema  . HERNIA REPAIR  16/1096   umbilical hernia  . KIDNEY TRANSPLANT  08/2007  . KNEE SURGERY    . LEFT HEART CATHETERIZATION WITH CORONARY ANGIOGRAM N/A 01/09/2013   Procedure: LEFT HEART CATHETERIZATION WITH CORONARY ANGIOGRAM;  Surgeon: Burnell Blanks, MD;  Location: [facility] CATH LAB;  Service: Cardiovascular;  Laterality: N/A;  . PARATHYROIDECTOMY    . TEE WITHOUT CARDIOVERSION N/A 11/11/2016   Procedure: TRANSESOPHAGEAL ECHOCARDIOGRAM (TEE);  Surgeon: Fay Records, MD;  Location: [facility];  Service: Cardiovascular;  Laterality: N/A;  . TEE WITHOUT CARDIOVERSION N/A 12/09/2016   Procedure: TRANSESOPHAGEAL ECHOCARDIOGRAM (TEE);  Surgeon: Sanda Klein, MD;  Location: [facility] ENDOSCOPY;  Service: Cardiovascular;  Laterality: N/A;    Allergies  Allergen Reactions  . Adhesive [Tape] Rash    Please use paper tape  . Amoxicillin Rash    migraine  . Ativan [Lorazepam] Anxiety  . Bupropion Anxiety  . Diphenhydramine Itching and Anxiety    Only with IV doses. Tolerates oral.  . Penicillins Other (See Comments)    MIGRAINE  Has patient had a PCN reaction causing immediate rash, facial/tongue/throat swelling, SOB or lightheadedness with hypotension: no Has patient had a PCN reaction causing severe rash involving mucus membranes or skin necrosis: No Has patient had a PCN reaction that required hospitalization  No Has patient had a PCN reaction occurring within the last 10 years: No If all of the above answers are "NO", then may proceed with Cephalosporin use.    Prior to Admission medications   Medication Sig Start Date End Date Taking? Authorizing Provider  acetaminophen (TYLENOL) 500 MG tablet Take 1,000 mg every 6 (six) hours as needed by mouth for mild pain.    Yes [provider]  albuterol (ACCUNEB) 1.25 MG/3ML nebulizer solution Take 3 mLs (1.25 mg total) by nebulization every 6 (six) hours as needed for wheezing. 06/16/17  Yes Tanda Rockers, MD  albuterol (PROAIR HFA) 108 (90 Base) MCG/ACT inhaler Inhale 2 puffs every 6 (six) hours as needed into the lungs for wheezing or shortness of breath. 07/19/17  Yes Rhyne, Samantha J, PA-C  atorvastatin (LIPITOR) 40 MG tablet Take 0.5 tablets (20 mg total) by mouth at bedtime. 05/06/17  Yes Burnell Blanks, MD  calcium acetate (PHOSLO) 667 MG capsule Take 4,002 mg by mouth 3 (three) times daily with meals.    Yes [provider]  citalopram (CELEXA) 20 MG tablet Take 20 mg by mouth at bedtime.    Yes [provider]  diphenhydrAMINE (BENADRYL) 2 % cream Apply 1 application topically daily as needed for itching.   Yes [provider]  docusate sodium (COLACE) 100 MG capsule Take 100 mg by mouth 2 (two) times daily.    Yes [provider]  folic acid (FOLVITE) 1 MG tablet Take 1 mg by mouth daily.    Yes [provider]  gabapentin (NEURONTIN) 300 MG capsule Take 300 mg by mouth at bedtime. Patient taking two 300mg  capsules at bedtime sometime.   Yes [provider]  metoprolol tartrate (LOPRESSOR) 25 MG tablet Take 12.5 mg by mouth 2 (two) times daily.   Yes [provider]  midodrine (PROAMATINE) 10 MG tablet Take 10 mg by mouth 3 (three) times daily.   Yes [provider]  multivitamin (RENA-VIT) TABS tablet Take 1 tablet by mouth daily. 01/11/13  Yes Samella Parr, NP  OXYGEN 2lpm with sleep and "at home"   Yes [provider]  oxymetazoline (AFRIN) 0.05 % nasal spray Place 1 spray into both nostrils 2 (two) times daily as needed for congestion.   Yes [provider]  pantoprazole (PROTONIX) 40 MG tablet Take 1 tablet (40 mg total) by mouth 2 (two) times daily. 02/09/17  Yes Burnell Blanks, MD  polyethylene glycol (MIRALAX / GLYCOLAX) packet Take 17 g daily as needed by mouth for mild constipation. Mix in 8 oz liquid and drink    Yes [provider]  STIOLTO RESPIMAT 2.5-2.5 MCG/ACT AERS INHALE 2 PUFFS BY MOUTH DAILY 07/06/17  Yes [provider]  warfarin (COUMADIN) 7.5 MG tablet Take 0.5-1 tablets (3.75-7.5 mg total) See admin instructions by mouth. Patient taking differently: Take 3.75-7.5 mg by mouth See admin instructions. On Monday (only) he takes one tablet 7.5mg . 07/19/17  Yes Rhyne, Hulen Shouts, PA-C  nitroGLYCERIN (NITROSTAT) 0.4 MG SL tablet Place 0.4 mg under the tongue every 5 (five) minutes as needed for chest pain (max 3 doses - if no relief call MD).     [provider]  oxyCODONE (ROXICODONE) 5 MG immediate release tablet Take 1 tablet (5 mg total) every 6 (six) hours as needed by mouth. 07/19/17   Rhyne, Hulen Shouts, PA-C    Social History   Socioeconomic History  . Marital status: Married    Spouse name: Not on file  . Number of children: Not on file  . Years of education: Not on file  . Highest education level: Not on file  Social Needs  . Financial resource strain: Not on file  . Food insecurity - worry: Not on file  . Food insecurity - inability: Not on file  . Transportation needs - medical: Not on file  . Transportation needs - non-medical: Not on file  Occupational History  . Occupation: Disabled  Tobacco Use  . Smoking status: Former Smoker    Packs/day: 2.00    Years: 20.00    Pack years: 40.00    Types: Cigarettes    Last attempt to quit: 08/06/1998    Years since  quitting: 19.0  .  Smokeless tobacco: Never Used  Substance and Sexual Activity  . Alcohol use: No    Alcohol/week: 0.0 oz  . Drug use: No  . Sexual activity: Not on file  Other Topics Concern  . Not on file  Social History Narrative   No history of premature CAD in either parents or siblings. However, his maternal grandfather and 2 maternal uncles had coronary artery disease.     Family History  Problem Relation Age of Onset  . Heart failure Mother        started in 11s  . Emphysema Mother        smoked  . Diabetes Father   . Lupus Sister   . Heart attack Maternal Grandfather   . Heart attack Maternal Uncle   . Heart attack Maternal Aunt   . Hypertension Maternal Aunt   . Stroke Neg Hx     ROS: Otherwise negative unless mentioned in HPI  Physical Examination  Vitals:   08/01/17 1645 08/01/17 1700  BP: (!) 71/58 (!) 82/55  Pulse: (!) 114 (!) 116  Resp: 16 19  Temp:    SpO2: 93% 94%   Body mass index is 24.39 kg/m.  General:  WDWN in NAD Gait: Not observed HENT: WNL, normocephalic Pulmonary: normal non-labored breathing, without Rales, rhonchi,  wheezing Cardiac: tachycardia, without  Murmurs, rubs or gallops; without carotid bruits Chest: R IJ PermCath without purulence, surrounding erythema, or obvious infection Abdomen: soft, NT/ND, no masses Skin: boil noted L upper back with small area of fluctuance Vascular Exam/Pulses: feet warm to touch with good capillary refill despite lack of pedal pulses Extremities: without ischemic changes, without Gangrene , L AC fossa incision with dry eschar and mild erythema locally surround incision; palpable area of fluctuance consistent with seroma; palpable thrill and audible bruit through L caphalic vein which is visibly maturing Musculoskeletal: no muscle wasting or atrophy  Neurologic: A&O X 3;  No focal weakness or paresthesias are detected; speech is fluent/normal Psychiatric:  The pt has Normal affect. Lymph:   Unremarkable  CBC    Component Value Date/Time   WBC 6.6 08/01/2017 0801   RBC 3.51 (L) 08/01/2017 0801   HGB 11.9 (L) 08/01/2017 0801   HGB 12.8 (L) 12/15/2016 1558   HCT 38.1 (L) 08/01/2017 0801   HCT 39.1 12/15/2016 1558   PLT 79 (L) 08/01/2017 0801   PLT 139 (L) 12/15/2016 1558   MCV 108.5 (H) 08/01/2017 0801   MCV 101 (H) 12/15/2016 1558   MCH 33.9 08/01/2017 0801   MCHC 31.2 08/01/2017 0801   RDW 19.4 (H) 08/01/2017 0801   RDW 15.8 (H) 12/15/2016 1558   LYMPHSABS 0.5 (L) 12/15/2016 1558   MONOABS 0.7 11/15/2016 1550   EOSABS 0.1 12/15/2016 1558   BASOSABS 0.0 12/15/2016 1558    BMET    Component Value Date/Time   NA 138 08/01/2017 0801   NA 141 12/15/2016 1558   K 4.8 08/01/2017 0801   CL 101 08/01/2017 0801   CO2 23 08/01/2017 0801   GLUCOSE 114 (H) 08/01/2017 0801   BUN 41 (H) 08/01/2017 0801   BUN 31 (H) 12/15/2016 1558   CREATININE 6.43 (H) 08/01/2017 0801   CALCIUM 6.7 (L) 08/01/2017 0801   GFRNONAA 8 (L) 08/01/2017 0801   GFRAA 10 (L) 08/01/2017 0801    COAGS: Lab Results  Component Value Date   INR 3.26 08/01/2017   INR 1.8 07/22/2017   INR 1.85 07/19/2017     Non-Invasive Vascular Imaging:  Patent L brachiocephalic fistula Fluid collection in [facility] fossa  Statin:  No. Beta Blocker:  Yes.   Aspirin:  No. ACEI:  No. ARB:  No. CCB use:  No Other antiplatelets/anticoagulants:  Yes.   coumadin   ASSESSMENT/PLAN: This is a 61 y.o. male with suspected L AC fossa surgical wound infection  Agree with blood cultures and initiation of antibiotic therapy Local redness and fluctuance noted at [facility] fossa incision however it is unlikely to be etiology of patient's systemic symptoms; exam consistent with post operative seroma Continue HD from PermCath We will follow incision while patient is admitted Dr. Donnetta Hutching to evaluate patient later today  Dagoberto Ligas PA-C [facility] [phone]  I have examined the patient, reviewed and  agree with above.  Excellent Leib Elahi maturation of his fistula created 2 weeks ago.  Does have some serous fluid at the antecubital wound but mild erythema as expected 2 weeks out and no evidence of infection.  Do not feel this would be the cause of his hypotension.  Will follow up with Dr. Oneida Alar as an outpatient as planned.  Curt Jews, MD 08/01/2017 7:27 PM

## 2017-08-01 NOTE — H&P (Signed)
Date: 08/01/2017               Patient Name:  [name] MRN: [id number]  DOB: 1956/01/27 Age / Sex: 61 y.o., male   PCP: Algis Greenhouse, MD         Medical Service: Internal Medicine Teaching Service         Attending Physician: Dr. Aldine Contes, MD    First Contact: Dr. Johny Chess Pager: [phone]  Second Contact: Dr. Tiburcio Pea Pager: [phone]       After Hours (After 5p/  First Contact Pager: [phone]  weekends / holidays): Second Contact Pager: [phone]   Chief Complaint: Dizziness  History of Present Illness:  [name] is a 61 year old man with PMH significant for CAD s/p 2V CABG 2014 and DES (hx of STEMI & NSTEMI), CHF (EF 45-50%), Permanent Atrial Flutter/Fibrillation (s/p cardioversion 12/09/16 and failed ablation 03/15/17) on Coumadin, s/p bioprosthetic Aortic Valve/ Replacement, ESRD on MWF HD (s/p prior transplant with rejection), COPD on 2L home O2, Multiple Sclerosis, Squamous Cell Cancer of the Lung (s/p SBRT), PVD, and hypotension who presented to the ED from his dialysis center with dizziness and hypotension.  Patient reports about 3 weeks of lightheadedness when standing or walking without a room spinning sensation. He noticed this has occurred since his left brachial AV fistula was placed and around the time he was started on Trazodone. He says he has no symptoms when sitting at rest. He says he has occasional increased shortness of breath from his baseline when exerting himself. He has chronic palpitations from his atrial flutter. He denies any chest pain, diaphoresis, nausea, vomiting, abdominal pain, focal weakness, or loss of consciousness. He denies any recent falls or injuries. He no longer makes urine. He reports chronic swelling in his legs which is unchanged. He normally sleeps sitting up in a recliner due to orthopnea. He has noticed some increased darkening of his skin in both his legs compared to his baseline. He denies any obvious bleeding  including hemoptysis, hematemesis, hematochezia, or melena. He says his dry weight has been increased with dialysis to try to accommodate his low blood pressures.  In the ED, vitals were: BP 73/55   Pulse 113   Temp 97.7 F (36.5 C)   Resp 14   Wt 77.1 kg (170 lb)  He was noted to be in atrial flutter. Initial lab work was notable for BUN 41, Cr 6.43, K 4.8, Hgb 11.9 (b/l ~ 12), I-stat Trop 0.05, INR 3.26, BNP 1426 (below baseline), TSH 8.689 (3.957 eight months ago). He was given 500 mLs NS bolus for his hypotension and a dose of Cefazolin for questionable cellulitis at his left AVF site. IMTS were consulted for persistent hypotension.   Meds:  Current Meds  Medication Sig  . acetaminophen (TYLENOL) 500 MG tablet Take 1,000 mg every 6 (six) hours as needed by mouth for mild pain.   Marland Kitchen albuterol (ACCUNEB) 1.25 MG/3ML nebulizer solution Take 3 mLs (1.25 mg total) by nebulization every 6 (six) hours as needed for wheezing.  Marland Kitchen albuterol (PROAIR HFA) 108 (90 Base) MCG/ACT inhaler Inhale 2 puffs every 6 (six) hours as needed into the lungs for wheezing or shortness of breath.  Marland Kitchen atorvastatin (LIPITOR) 40 MG tablet Take 0.5 tablets (20 mg total) by mouth at bedtime.  . calcium acetate (PHOSLO) 667 MG capsule Take 4,002 mg by mouth 3 (three) times daily with meals.   . citalopram (CELEXA) 20 MG  tablet Take 20 mg by mouth at bedtime.   . diphenhydrAMINE (BENADRYL) 2 % cream Apply 1 application topically daily as needed for itching.  . docusate sodium (COLACE) 100 MG capsule Take 100 mg by mouth 2 (two) times daily.   . folic acid (FOLVITE) 1 MG tablet Take 1 mg by mouth daily.   Marland Kitchen gabapentin (NEURONTIN) 300 MG capsule Take 300 mg by mouth at bedtime. Patient taking two 300mg  capsules at bedtime sometime.  . metoprolol tartrate (LOPRESSOR) 25 MG tablet Take 12.5 mg by mouth 2 (two) times daily.  . midodrine (PROAMATINE) 10 MG tablet Take 10 mg by mouth 3 (three) times daily.  . multivitamin  (RENA-VIT) TABS tablet Take 1 tablet by mouth daily.  . OXYGEN 2lpm with sleep and "at home"  . oxymetazoline (AFRIN) 0.05 % nasal spray Place 1 spray into both nostrils 2 (two) times daily as needed for congestion.  . pantoprazole (PROTONIX) 40 MG tablet Take 1 tablet (40 mg total) by mouth 2 (two) times daily.  . polyethylene glycol (MIRALAX / GLYCOLAX) packet Take 17 g daily as needed by mouth for mild constipation. Mix in 8 oz liquid and drink   . STIOLTO RESPIMAT 2.5-2.5 MCG/ACT AERS INHALE 2 PUFFS BY MOUTH DAILY  . warfarin (COUMADIN) 7.5 MG tablet Take 0.5-1 tablets (3.75-7.5 mg total) See admin instructions by mouth. (Patient taking differently: Take 3.75-7.5 mg by mouth See admin instructions. On Monday (only) he takes one tablet 7.5mg .)     Allergies: Allergies as of 08/01/2017 - Review Complete 08/01/2017  Allergen Reaction Noted  . Adhesive [tape] Rash 02/12/2012  . Amoxicillin Rash 01/29/2016  . Ativan [lorazepam] Anxiety 09/10/2016  . Bupropion Anxiety 12/05/2016  . Diphenhydramine Itching and Anxiety 12/04/2012  . Penicillins Other (See Comments) 02/12/2012   Past Medical History:  Diagnosis Date  . Acute blood loss anemia    a. 02/2016 due to groin hematoma. Rec 3 U PRBC.  Marland Kitchen Anemia   . Anxiety   . Aortic heart valve prolapse 04/2013   a. s/p bioprosthetic AVR at time of CABG.  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 Bifascicular block   . CAD (coronary artery disease) 04/2013   a. 04/2013: s/p CABG x 2 (Y SVG -LAD & D2). b. 01/2015: NSTEMI s/p DES to LCx, BMS to SVG-LAD. c. NSTEMI 05/2015 during AF/AFL - non-flow limiting FFR. d. Low risk nuc 3/17. e. STEMI 6/17 after coming off Plavix, s/p DES to SVG-LAD into native vessel.  . Cancer ([place])    lung cancer - 5 doses of radiation, F/U in Dec. 2018  . Carotid artery disease (HCC)    a. 1-39% stenosis bilaterally in 06/2015.   Marland Kitchen CHF (congestive heart failure) ([place])   . Chronic respiratory failure ([place])     . COPD (chronic obstructive pulmonary disease) ([place])   . Dyspnea   . 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 b. He had a renal Tx from 2008 to 2011, but subsequent rejection - Gets HD in [place], [place] on MWF schedule.    Marland Kitchen GERD (gastroesophageal reflux disease)   . Hematoma    a. Large right groin hematoma after cath 02/2016 with associated ABL anemia.  . Hypertension   . Multiple sclerosis ([place])   . Myocardial infarction ([place])   . Nocturnal hypoxemia   . On home oxygen therapy    pt states he has not been wearing it  .  PAF (paroxysmal atrial fibrillation) ([place])    a. h/o, placed on amiodarone 07/2016 due to recurrence.  . Paroxysmal atrial flutter ([place])    a. During 05/2015 admission - SVT, atrial flutter, and PAF.  Marland Kitchen Peripheral vascular disease ([place])    Cath in 01/2015 required 45 cm [place]  . Pneumonia   . Renal transplant failure and rejection   . 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 intolerant of bipap. Sleeps with O2 at 2 L  . SVT (supraventricular tachycardia) ([place])   . Valvular heart disease    a. 2D Echo 03/25/16: mild LVH, EF 50-55%, grade 2 Dd, AVR present with mild AI, mod MR, severely dilated LA, mildly dilated RV, mod RAE, PASP 72.    Family History:  Family History  Problem Relation Age of Onset  . Heart failure Mother        started in 49s  . Emphysema Mother        smoked  . Diabetes Father   . Lupus Sister   . Heart attack Maternal Grandfather   . Heart attack Maternal Uncle   . Heart attack Maternal Aunt   . Hypertension Maternal Aunt   . Stroke Neg Hx      Social History:  Social History   Socioeconomic History  . Marital status: Married    Spouse name: None  . Number of children: None  . Years of education: None  . Highest education level: None  Social Needs  . Financial resource strain: None  . Food insecurity - worry: None  . Food insecurity -  inability: None  . Transportation needs - medical: None  . Transportation needs - non-medical: None  Occupational History  . Occupation: Disabled  Tobacco Use  . Smoking status: Former Smoker    Packs/day: 2.00    Years: 20.00    Pack years: 40.00    Types: Cigarettes    Last attempt to quit: 08/06/1998    Years since quitting: 19.0  . Smokeless tobacco: Never Used  Substance and Sexual Activity  . Alcohol use: No    Alcohol/week: 0.0 oz  . Drug use: No  . Sexual activity: None  Other Topics Concern  . None  Social History Narrative   No history of premature CAD in either parents or siblings. However, his maternal grandfather and 2 maternal uncles had coronary artery disease.     Review of Systems: A complete ROS was negative except as per HPI.   Physical Exam: Blood pressure (!) 79/58, pulse (!) 116, temperature 97.7 F (36.5 C), temperature source Oral, resp. rate 20, height 5\' 10"  (1.778 m), weight 170 lb (77.1 kg), SpO2 93 %. Physical Exam  Constitutional: He is oriented to person, place, and time. No distress.  Chronically ill-appearing man  HENT:  Head: Normocephalic and atraumatic.  Mouth/Throat: Oropharynx is clear and moist.  Eyes: EOM are normal. Pupils are equal, round, and reactive to light.  Neck: Neck supple.  Cardiovascular:  Tachycardic, 2/6 murmur heard at the apex.  Left Brachial AVF with slight surrounding erythema.   Pulmonary/Chest: No accessory muscle usage. No tachypnea. No respiratory distress. He has no rhonchi. He has no rales.  End-expiratory wheezing heard at lower lung fields bilaterally.  Abdominal: Soft. Bowel sounds are normal. He exhibits no distension. There is no tenderness.  Well healed surgical scar  Musculoskeletal:  Dialysis port right  chest wall present. +1 pitting edema bilateral legs  Neurological: He is alert and oriented to person, place, and time.  Skin: Skin is warm.  Hyperpigmentation bilateral lower extremities    Psychiatric: He has a normal mood and affect.     EKG: personally reviewed my interpretation is atrial flutter with rate 114 bpm  CXR: personally reviewed my interpretation is sternotomy wires present, dialysis catheter present, aortic atherosclerosis, prosthetic valve present, no effusion or consolidation.  Assessment & Plan by Problem: Principal Problem:   Orthostatic hypotension Active Problems:   Multiple sclerosis (HCC)   End-stage renal disease (HCC)   CAD of artery bypass graft-occluded SVG-LAD 02/15/16   Typical atrial flutter (HCC)   H/O aortic valve replacement   COPD GOLD III    Chronic diastolic congestive heart failure ([place])  61 year old man with PMH significant for CAD s/p 2V CABG 2014 and DES (hx of STEMI & NSTEMI), CHF (EF 45-50%), Permanent Atrial Flutter/Fibrillation (s/p cardioversion 12/09/16 and failed ablation 03/15/17) on Coumadin, s/p bioprosthetic Aortic Valve/ Replacement, ESRD on MWF HD (s/p prior transplant with rejection), COPD on 2L home O2, Multiple Sclerosis, Squamous Cell Cancer of the Lung (s/p SBRT), PVD, and hypotension who is admitted for persistent hypotension with orthostatic symptoms in the setting of atrial flutter and ESRD.  Orthostatic Hypotension: His symptoms are consistent with orthostatic hypotension and he does have a chronic history of hypotension for which he is on Midodrine. He does not appear significantly hypovolemic or hypervolemic. There is slight erythema around his left AVF possible cellulitis. He has no other obvious sign of systemic infection to suggest infectious cause of his hypotension. His persistent atrial fibrillation is likely contributing due to decreased atrial kick and cardiac output. He is in a difficult situation with regards to continuing dialysis with his persistent hypotension as well as optimizing his cardiac medications. Adrenal insufficiency is a possibility, however his lower extremity hyperpigmentation is likely  dermatitis from his PVD. He underwent SBRT for lung cancer which puts radiation induced pericardial effusion on the differential as well. If no underlying reversible cause of his persistent hypotension is found, he may be moving towards palliative care. - Continue Midodrine 10 mg three times daily during the daytime (every 3-4 hours) while standing - Continue IV Cefazolin - Strict I/Os, Daily weights - Check orthostatics - PT/OT eval - Check AM cortisol - Obtain TTE - Blood cultures - Nephrology and Cardiology consulted, appreciate assistance  Persistent Atrial Flutter/Fibrillation: Patient with persistent atrial flutter with failed ablation and prior cardioversion which was temporarily successful. He is not a candidate for Amiodarone given his lung disease. He is on Metoprolol for rate control which is limited in up titration due to his hypotension. He is on coumadin for anticoagulation to which he reports adherence. His CHADSVASc score is at least 2. Question whether he is a candidate for repeat cardioversion to improve atrial kick and hopefully improve blood pressure. He is otherwise limited to rate control measures. - Continue Metoprolol 12.5 mg BID - Continue Coumadin per pharmacy (INR supratherapeutic on admission) - Cardiology consulted  ESRD on MWF HD: S/p transplant with subsequent rejection. He did not undergo his regular HD session today due to his hypotension. He has a dialysis catheter in place for use until left brachial AVF matures. There is slight erythema at his AVF. He received a dose of Cefazolin in the ED which we will continue. - Nephrology consulted; appreciate assistance - Doppler AV fistula   CAD s/p CABG  and DES: No active chest pain. He is on Atorvastatin 20 daily at home, decreased from 40 due to myalgias. He is no longer on ASA or Plavix, but continues on Coumadin. - Continue Atorvastatin 20 mg daily - Continue Metoprolol 12.5 mg BID  COPD on Chronic 2 L O2 via  [place]: - Brovana 15 mcg BID, Incruse 62.5 mcg daily (change from home due to formulary) - Albuterol q6h prn   Code Status: FULL CODE per patient.    Dispo: Admit patient to Inpatient with expected length of stay greater than 2 midnights.  Signed: Zada Finders, MD 08/01/2017, 3:51 PM

## 2017-08-01 NOTE — ED Notes (Signed)
Pt eating lunch

## 2017-08-02 ENCOUNTER — Inpatient Hospital Stay (HOSPITAL_COMMUNITY): Payer: Medicare Other

## 2017-08-02 DIAGNOSIS — I959 Hypotension, unspecified: Secondary | ICD-10-CM

## 2017-08-02 DIAGNOSIS — N186 End stage renal disease: Secondary | ICD-10-CM

## 2017-08-02 DIAGNOSIS — I951 Orthostatic hypotension: Secondary | ICD-10-CM

## 2017-08-02 DIAGNOSIS — R000 Tachycardia, unspecified: Secondary | ICD-10-CM

## 2017-08-02 DIAGNOSIS — J449 Chronic obstructive pulmonary disease, unspecified: Secondary | ICD-10-CM

## 2017-08-02 DIAGNOSIS — I25700 Atherosclerosis of coronary artery bypass graft(s), unspecified, with unstable angina pectoris: Secondary | ICD-10-CM

## 2017-08-02 DIAGNOSIS — I4892 Unspecified atrial flutter: Secondary | ICD-10-CM

## 2017-08-02 LAB — TROPONIN I: Troponin I: 0.08 ng/mL (ref <0.03)

## 2017-08-02 LAB — RENAL FUNCTION PANEL
ANION GAP: 13 (ref 5–15)
Albumin: 2.7 g/dL — ABNORMAL LOW (ref 3.5–5.0)
BUN: 50 mg/dL — ABNORMAL HIGH (ref 6–20)
CO2: 21 mmol/L — ABNORMAL LOW (ref 22–32)
CREATININE: 7.25 mg/dL — AB (ref 0.61–1.24)
Calcium: 6.4 mg/dL — CL (ref 8.9–10.3)
Chloride: 103 mmol/L (ref 101–111)
GFR, EST AFRICAN AMERICAN: 8 mL/min — AB (ref >60)
GFR, EST NON AFRICAN AMERICAN: 7 mL/min — AB (ref >60)
Glucose, Bld: 107 mg/dL — ABNORMAL HIGH (ref 65–99)
POTASSIUM: 5.2 mmol/L — AB (ref 3.5–5.1)
Phosphorus: 5.8 mg/dL — ABNORMAL HIGH (ref 2.5–4.6)
Sodium: 137 mmol/L (ref 135–145)

## 2017-08-02 LAB — PROTIME-INR
INR: 3.43
Prothrombin Time: 34.3 seconds — ABNORMAL HIGH (ref 11.4–15.2)

## 2017-08-02 LAB — CBC
HEMATOCRIT: 36.7 % — AB (ref 39.0–52.0)
HEMOGLOBIN: 11.8 g/dL — AB (ref 13.0–17.0)
MCH: 34.7 pg — ABNORMAL HIGH (ref 26.0–34.0)
MCHC: 32.2 g/dL (ref 30.0–36.0)
MCV: 107.9 fL — AB (ref 78.0–100.0)
PLATELETS: 85 10*3/uL — AB (ref 150–400)
RBC: 3.4 MIL/uL — AB (ref 4.22–5.81)
RDW: 19.4 % — ABNORMAL HIGH (ref 11.5–15.5)
WBC: 7.4 10*3/uL (ref 4.0–10.5)

## 2017-08-02 LAB — CORTISOL-AM, BLOOD: CORTISOL - AM: 11.6 ug/dL (ref 6.7–22.6)

## 2017-08-02 LAB — MRSA PCR SCREENING: MRSA BY PCR: NEGATIVE

## 2017-08-02 MED ORDER — HYDROCORTISONE NA SUCCINATE PF 100 MG IJ SOLR
50.0000 mg | Freq: Once | INTRAMUSCULAR | Status: AC
  Administered 2017-08-02: 50 mg via INTRAVENOUS
  Filled 2017-08-02: qty 2

## 2017-08-02 MED ORDER — MIDODRINE HCL 5 MG PO TABS
ORAL_TABLET | ORAL | Status: AC
  Filled 2017-08-02: qty 2

## 2017-08-02 MED ORDER — SODIUM CHLORIDE 0.9 % IV BOLUS (SEPSIS)
500.0000 mL | Freq: Once | INTRAVENOUS | Status: AC
  Administered 2017-08-02: 500 mL via INTRAVENOUS

## 2017-08-02 MED ORDER — MIDODRINE HCL 5 MG PO TABS
10.0000 mg | ORAL_TABLET | Freq: Once | ORAL | Status: AC
  Administered 2017-08-02: 10 mg via ORAL
  Filled 2017-08-02: qty 2

## 2017-08-02 MED ORDER — CEFAZOLIN SODIUM-DEXTROSE 1-4 GM/50ML-% IV SOLN
1.0000 g | INTRAVENOUS | Status: DC
  Administered 2017-08-02 – 2017-08-04 (×3): 1 g via INTRAVENOUS
  Filled 2017-08-02 (×7): qty 50

## 2017-08-02 MED ORDER — ALBUMIN HUMAN 25 % IV SOLN
INTRAVENOUS | Status: AC
  Filled 2017-08-02: qty 200

## 2017-08-02 MED ORDER — ALBUMIN HUMAN 25 % IV SOLN
50.0000 g | Freq: Once | INTRAVENOUS | Status: AC
  Administered 2017-08-02: 50 g via INTRAVENOUS

## 2017-08-02 NOTE — Consult Note (Signed)
Name: [name] MRN: [id number] DOB: 1955-11-17    ADMISSION DATE:  08/01/2017 CONSULTATION DATE:  08/02/2017  REFERRING MD :  Dr. Dareen Piano   CHIEF COMPLAINT:  Dizziness and hypotension  HISTORY OF PRESENT ILLNESS:   [name] with complex medical history as listed below who presents with 3 week history of dizziness and dyspnea on exertion.  States about the time it started is when he had his LUE fistula placed.  He briefly took trazodone but stopped that.  Denies fever, chills, chest pain, vomiting, or diarrhea.  Is MWF dialysis patient.  Last dialysis on Friday, 11/23.  Went to dialysis on Monday but was sent to ER for hypotension.  Patients normal SBP is in the 80-90's and is on midodrine at home, only missing one dose on Monday.  On admit, patient afebrile, tachycardic in Aflutter around 110-120 with labs noted for WBC 7.4, lactic acid 1.78, cortisol level 14.6, am level 11.6, CXR with non-acute process.  He was given two NS 500 ml boluses and stress dose steroids with minimal improvement.  However patient remains alert and oriented.   Admitted to step-down to internal medicine.  PCCM consulted for hypotension.    PAST MEDICAL HISTORY :   has a past medical history of Acute blood loss anemia, Anemia, Anxiety, Aortic heart valve prolapse (04/2013), Bifascicular block, CAD (coronary artery disease) (04/2013), Cancer ([place]), Carotid artery disease ([place]), CHF (congestive heart failure) ([place]), Chronic respiratory failure ([place]), COPD (chronic obstructive pulmonary disease) ([place]), Dyspnea, ESRD on hemodialysis (02/12/2012), GERD (gastroesophageal reflux disease), Hematoma, Hypertension, Multiple sclerosis ([place]), Myocardial infarction ([place]), Nocturnal hypoxemia, On home oxygen therapy, PAF (paroxysmal atrial fibrillation) ([place]), Paroxysmal atrial flutter ([place]), Peripheral vascular disease ([place]), Pneumonia, Renal transplant failure and rejection, 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  has a past surgical history that includes AV fistula placement; flash; Kidney transplant (08/2007); excised squamous cells at rectum (2006); left heart catheterization with coronary angiogram (N/A, 01/09/2013); Cardiac catheterization (N/A, 05/26/2015); Cardiac catheterization (N/A, 05/26/2015); Cardiac catheterization (N/A, 02/15/2016); Coronary artery bypass graft; Cardioversion (N/A, 07/12/2016); Cardioversion (N/A, 11/11/2016); TEE without cardioversion (N/A, 11/11/2016); Knee surgery; TEE without cardioversion (N/A, 12/09/2016); Cardioversion (N/A, 12/09/2016); Coronary angioplasty; Parathyroidectomy; Hernia repair (07/2011); Appendectomy; Colonoscopy; Esophagogastroduodenoscopy endoscopy; and AV fistula placement (Left, 07/19/2017). Prior to Admission medications   Medication Sig Start Date End Date Taking? Authorizing Provider  acetaminophen (TYLENOL) 500 MG tablet Take 1,000 mg every 6 (six) hours as needed by mouth for mild pain.    Yes [provider]  albuterol (ACCUNEB) 1.25 MG/3ML nebulizer solution Take 3 mLs (1.25 mg total) by nebulization every 6 (six) hours as needed for wheezing. 06/16/17  Yes Tanda Rockers, MD  albuterol (PROAIR HFA) 108 (90 Base) MCG/ACT inhaler Inhale 2 puffs every 6 (six) hours as needed into the lungs for wheezing or shortness of breath. 07/19/17  Yes Rhyne, Hulen Shouts, PA-C  atorvastatin (LIPITOR) 40 MG tablet Take 0.5 tablets (20 mg total) by mouth at bedtime. 05/06/17  Yes Burnell Blanks, MD  calcium acetate (PHOSLO) 667 MG capsule Take 4,002 mg by mouth 3 (three) times daily with meals.    Yes [provider]  citalopram (CELEXA) 20 MG tablet Take 20 mg by mouth at bedtime.    Yes [provider]  diphenhydrAMINE (BENADRYL) 2 % cream Apply 1 application topically daily as needed for itching.   Yes [provider]  docusate sodium (COLACE) 100 MG  capsule Take 100 mg  by mouth 2 (two) times daily.    Yes [provider]  folic acid (FOLVITE) 1 MG tablet Take 1 mg by mouth daily.    Yes [provider]  gabapentin (NEURONTIN) 300 MG capsule Take 300 mg by mouth at bedtime. Patient taking two 300mg  capsules at bedtime sometime.   Yes [provider]  metoprolol tartrate (LOPRESSOR) 25 MG tablet Take 12.5 mg by mouth 2 (two) times daily.   Yes [provider]  midodrine (PROAMATINE) 10 MG tablet Take 10 mg by mouth 3 (three) times daily.   Yes [provider]  multivitamin (RENA-VIT) TABS tablet Take 1 tablet by mouth daily. 01/11/13  Yes Samella Parr, NP  OXYGEN 2lpm with sleep and "at home"   Yes [provider]  oxymetazoline (AFRIN) 0.05 % nasal spray Place 1 spray into both nostrils 2 (two) times daily as needed for congestion.   Yes [provider]  pantoprazole (PROTONIX) 40 MG tablet Take 1 tablet (40 mg total) by mouth 2 (two) times daily. 02/09/17  Yes Burnell Blanks, MD  polyethylene glycol (MIRALAX / GLYCOLAX) packet Take 17 g daily as needed by mouth for mild constipation. Mix in 8 oz liquid and drink    Yes [provider]  STIOLTO RESPIMAT 2.5-2.5 MCG/ACT AERS INHALE 2 PUFFS BY MOUTH DAILY 07/06/17  Yes [provider]  warfarin (COUMADIN) 7.5 MG tablet Take 0.5-1 tablets (3.75-7.5 mg total) See admin instructions by mouth. Patient taking differently: Take 3.75-7.5 mg by mouth See admin instructions. On Monday (only) he takes one tablet 7.5mg . 07/19/17  Yes Rhyne, Hulen Shouts, PA-C  nitroGLYCERIN (NITROSTAT) 0.4 MG SL tablet Place 0.4 mg under the tongue every 5 (five) minutes as needed for chest pain (max 3 doses - if no relief call MD).     [provider]  oxyCODONE (ROXICODONE) 5 MG immediate release tablet Take 1 tablet (5 mg total) every 6 (six) hours as needed by mouth. 07/19/17   Rhyne, Hulen Shouts, PA-C   Allergies  Allergen Reactions  . Adhesive  [Tape] Rash    Please use paper tape  . Amoxicillin Rash    migraine  . Ativan [Lorazepam] Anxiety  . Bupropion Anxiety  . Diphenhydramine Itching and Anxiety    Only with IV doses. Tolerates oral.  . Penicillins Other (See Comments)    MIGRAINE  Has patient had a PCN reaction causing immediate rash, facial/tongue/throat swelling, SOB or lightheadedness with hypotension: no Has patient had a PCN reaction causing severe rash involving mucus membranes or skin necrosis: No Has patient had a PCN reaction that required hospitalization No Has patient had a PCN reaction occurring within the last 10 years: No If all of the above answers are "NO", then may proceed with Cephalosporin use.    FAMILY HISTORY:  family history includes Diabetes in his father; Emphysema in his mother; Heart attack in his maternal aunt, maternal grandfather, and maternal uncle; Heart failure in his mother; Hypertension in his maternal aunt; Lupus in his sister. SOCIAL HISTORY:  reports that he quit smoking about 19 years ago. His smoking use included cigarettes. He has a 40.00 pack-year smoking history. he has never used smokeless tobacco. He reports that he does not drink alcohol or use drugs.  REVIEW OF SYSTEMS:  Positives in [place] Constitutional: Negative for fever, chills, weight loss, malaise/fatigue and diaphoresis.  HENT: Negative for hearing loss, ear pain, nosebleeds, congestion, sore throat, neck pain, tinnitus  and ear discharge.   Eyes: Negative for blurred vision, double vision, photophobia, pain, discharge and redness.  Respiratory: Negative for cough, hemoptysis, sputum production, shortness of breath on exertion, wheezing and stridor.   Cardiovascular: Negative for chest pain, palpitations, orthopnea, claudication, leg swelling and PND.  Gastrointestinal: Negative for heartburn, nausea, vomiting, abdominal pain, diarrhea, constipation, blood in stool and melena.  Genitourinary: Negative for dysuria,  urgency, frequency, hematuria and flank pain.  Musculoskeletal: Negative for myalgias, back pain, joint pain and falls.  Skin: Negative for itching and rash.  Neurological: Negative for dizziness, tingling, tremors, sensory change, speech change, focal weakness, seizures, loss of consciousness, weakness and headaches.  Endo/Heme/Allergies: Negative for environmental allergies and polydipsia. Does not bruise/bleed easily.  SUBJECTIVE:  Patient without complaints  VITAL SIGNS: Temp:  [97.7 F (36.5 C)-97.8 F (36.6 C)] 97.8 F (36.6 C) (11/27 0551) Pulse Rate:  [99-119] 119 (11/27 0515) Resp:  [14-30] 18 (11/27 0515) BP: (68-99)/(52-73) 80/58 (11/27 0551) SpO2:  [84 %-100 %] 96 % (11/27 0515) Weight:  [170 lb (77.1 kg)] 170 lb (77.1 kg) (11/26 0756)  PHYSICAL EXAMINATION: General:  Chronically ill appearing male sitting up in bed in NAD Neuro: AOx 3, nonfocal CV: tachycardic irregular rhythm PULM: even/non-labored, speaking full sentences without difficulty, lungs bilaterally clear anteriorly, bibasilar posterior crackles, on 2L at 91% GI: soft, non-tender, bs active  Extremities: warm/dry, BLE discoloration no edema, LUE fistula- incision site with mild warmth and erythema, no drainage  Skin: no rashes, area to left upper back with erythema, warmth, no fluctuance or drainage   Recent Labs  Lab 08/01/17 0801 08/02/17 0326  NA 138 137  K 4.8 5.2*  CL 101 103  CO2 23 21*  BUN 41* 50*  CREATININE 6.43* 7.25*  GLUCOSE 114* 107*   Recent Labs  Lab 08/01/17 0801 08/02/17 0326  HGB 11.9* 11.8*  HCT 38.1* 36.7*  WBC 6.6 7.4  PLT 79* 85*   Dg Chest 2 View  Result Date: 08/01/2017 CLINICAL DATA:  Shortness of breath. EXAM: CHEST  2 VIEW COMPARISON:  06/16/2017 FINDINGS: There is a right chest port a catheter with tip in the projection of the cavoatrial junction. The heart size is moderately enlarged. Status post median sternotomy and CABG procedure. The proximal sternotomy  wire appears chronically fractured. Chronic interstitial changes are identified bilaterally with scarring in the perihilar left upper lobe and right base. No superimposed pulmonary edema, pleural effusion or scarring. IMPRESSION: 1. No acute cardiopulmonary abnormalities. 2. Chronic bilateral scarring. Electronically Signed   By: Kerby Moors M.D.   On: 08/01/2017 08:30    ASSESSMENT / PLAN:  Chronic Hypotension - ESRD on midodrine - Patient is currently asymptomatic and mentating well with maintained MAPs above 60. Lactate is normal.  Looking at previous outpatient visits, his vitals are similar.  He does not appear overtly septic as he is afebrile with normal WBC.  Hgb stable.  CXR is non-acute. P:  Monitor clinically in SDU Goal MAP > 55 or change in mental status Continue midodrine 10mg  TID Continue solu-cortef  Continue empiric ancef and follow cultures Continue to hold lopressor Agree to get Cardiology input to help with rate control with Aflutter has this has been a previous issue with chronic hypotension.  Patient is followed at [facility], with previous failed ablation.    Remainder per Primary Team  PCCM will sign off.  Please do not hesitate to call us back if we can be of any further assistance.  Kennieth Rad,  AGACNP- [facility] and [place] Pager: [phone]  08/02/2017, 6:09 AM

## 2017-08-02 NOTE — Progress Notes (Signed)
ANTICOAGULATION CONSULT NOTE - Follow-up Consult  Pharmacy Consult for Warfarin Indication: atrial fibrillation  Labs: Recent Labs    08/01/17 0801 08/01/17 1002 08/01/17 1656 08/01/17 2139 08/02/17 0326 08/02/17 1216  HGB 11.9*  --   --   --  11.8*  --   HCT 38.1*  --   --   --  36.7*  --   PLT 79*  --   --   --  85*  --   LABPROT  --  33.0*  --   --   --  34.3*  INR  --  3.26  --   --   --  3.43  CREATININE 6.43*  --   --   --  7.25*  --   TROPONINI  --   --  0.07* 0.08* 0.08*  --    Assessment: 61 YOM here with dizziness and SOB. Pharmacy consulted to resume home warfarin therapy for h/o AFib. INR on admission is supratherapeutic at 3.26 and has risen to 3.4 after holding a dose yesterday. CBC is stable, will continue to hold warfarin.  Home warfarin dose: 7.5 mg on Mon; 3.75 mg on all other days.  Last dose 11/25  Goal of Therapy:  INR 2-3 Monitor platelets by anticoagulation protocol: Yes   Plan:  Hold warfarin Daily INR, CBC   [name] PharmD PGY1 Pharmacy Practice Resident 08/02/2017 1:40 PM Pager: [phone]

## 2017-08-02 NOTE — ED Notes (Signed)
MD updated of critical calcium level

## 2017-08-02 NOTE — Progress Notes (Addendum)
[facility] Progress Note  Subjective: no new c/o, says the new lower BP's started after he had the new AV fistula placed (07/19/17), 2 wks ago , in the L arm.  He has been symptomatic, "dizzy when I get up", "feels just like low blood pressure".  No SOB or cough.  CXR no edema. +leg edema  Vitals:   08/02/17 1151 08/02/17 1153 08/02/17 1258 08/02/17 1612  BP: (!) 93/52  (!) 83/66 (!) 68/58  Pulse: (!) 115  96 (!) 127  Resp: 20  20 15   Temp:   97.8 F (36.6 C) 98.5 F (36.9 C)  TempSrc:   Oral Axillary  SpO2: 95% 95% 92% 92%  Weight:      Height:        Inpatient medications: . arformoterol  15 mcg Nebulization BID  . atorvastatin  20 mg Oral QHS  . citalopram  20 mg Oral QHS  . midodrine  10 mg Oral TID  . pantoprazole  40 mg Oral BID  . umeclidinium bromide  1 puff Inhalation Daily  . Warfarin - Pharmacist Dosing Inpatient   Does not apply q1800   .  ceFAZolin (ANCEF) IV Stopped (08/02/17 1300)  . [START ON 08/03/2017] ferric gluconate (FERRLECIT/NULECIT) IV     acetaminophen **OR** acetaminophen, albuterol  Exam: Alert, calm, no distress No jvd Chest faint rales R base, L clear RRR 1/6 sem Abd soft no ascites Ext dense 2+ bilat pitting thigh/ hip edema LUA AVF +bruit, +erythema (mild) NF, ox 3  Dialysis: [facility] MWF 4h   77kg   2K/ 3Ca bath   Hep none  R IJ TDC / (maturing LUA AVF 07/19/17) -venofer 50/wk      Impression: 1. HYpotension - acute on chronic.  Baseline BP's were 90's, now BP's in 70's. Pt says it started after new AVF placed.  Blood cx's negative. He has known severe chronic lung disease and severe pulm HTN.  Suspect this could be decompensated heart failure (? R heart) w/ afterload reduction of the new AV fistula creation.  He is symptomatic w/ standing so it is unlikely that the BP readings are inaccurate.  He is vol overloaded (R side) w/ indurated LE edema.  May need heart cath, AVF ligation, a-line placement, other, not sure.  Will  d/w primary team. 2. ESRD on HD MWF - has been on HD 19 yrs. Is off schedule now, had HD yest.  3. Volume excess - up 5 kg today, 2.5 removed w HD yest 4. COPD - on home O2 5. Pulm HTN 6. Aflutter / fib - hx failed ablation, can't take amio due to lung disease.  Home metoprolol is on hold.  7. Mild cellulitis - L arm at AVF wound site. On empiric abx.  8. Anemia of CKD - no esa needed, Hb up 9. MBD - cont meds 10. CAD hx CABG/ DES  Plan - HD Wed, UF 2.5 L as Ronnie Derby MD Edie pager [phone]   08/02/2017, 5:21 PM   Recent Labs  Lab 08/01/17 0801 08/02/17 0326  NA 138 137  K 4.8 5.2*  CL 101 103  CO2 23 21*  GLUCOSE 114* 107*  BUN 41* 50*  CREATININE 6.43* 7.25*  CALCIUM 6.7* 6.4*  PHOS  --  5.8*   Recent Labs  Lab 08/02/17 0326  ALBUMIN 2.7*   Recent Labs  Lab 08/01/17 0801 08/02/17 0326  WBC 6.6 7.4  HGB 11.9* 11.8*  HCT 38.1* 36.7*  MCV 108.5* 107.9*  PLT 79* 85*   Iron/TIBC/Ferritin/ %Sat No results found for: IRON, TIBC, FERRITIN, IRONPCTSAT

## 2017-08-02 NOTE — Progress Notes (Signed)
OT Cancellation Note    08/02/17 0900  OT Visit Information  Last OT Received On 08/02/17  Reason Eval/Treat Not Completed Patient at procedure or test/ unavailable (HD)  Will attempt this pm if able.   [facility], OT/L  [phone] 08/02/2017

## 2017-08-02 NOTE — ED Notes (Signed)
IM Residents paged regarding patient's bed status with persistent hypotension.  Patient is A&Ox4 with no change in mentation.  Current labs and Vitals as follows:  Vitals:   08/02/17 0000 08/02/17 0100  BP: (!) 75/59 (!) 74/60  Pulse: (!) 112 (!) 115  Resp: 19 17  Temp:    SpO2: 91% 93%    Results for orders placed or performed during the hospital encounter of 98/26/41  Basic metabolic panel  Result Value Ref Range   Sodium 138 135 - 145 mmol/L   Potassium 4.8 3.5 - 5.1 mmol/L   Chloride 101 101 - 111 mmol/L   CO2 23 22 - 32 mmol/L   Glucose, Bld 114 (H) 65 - 99 mg/dL   BUN 41 (H) 6 - 20 mg/dL   Creatinine, Ser 6.43 (H) 0.61 - 1.24 mg/dL   Calcium 6.7 (L) 8.9 - 10.3 mg/dL   GFR calc non Af Amer 8 (L) >60 mL/min   GFR calc Af Amer 10 (L) >60 mL/min   Anion gap 14 5 - 15  CBC  Result Value Ref Range   WBC 6.6 4.0 - 10.5 K/uL   RBC 3.51 (L) 4.22 - 5.81 MIL/uL   Hemoglobin 11.9 (L) 13.0 - 17.0 g/dL   HCT 38.1 (L) 39.0 - 52.0 %   MCV 108.5 (H) 78.0 - 100.0 fL   MCH 33.9 26.0 - 34.0 pg   MCHC 31.2 30.0 - 36.0 g/dL   RDW 19.4 (H) 11.5 - 15.5 %   Platelets 79 (L) 150 - 400 K/uL  Protime-INR  Result Value Ref Range   Prothrombin Time 33.0 (H) 11.4 - 15.2 seconds   INR 3.26   Brain natriuretic peptide  Result Value Ref Range   B Natriuretic Peptide 1,426.5 (H) 0.0 - 100.0 pg/mL  Cortisol  Result Value Ref Range   Cortisol, Plasma 14.6 ug/dL  TSH  Result Value Ref Range   TSH 8.689 (H) 0.350 - 4.500 uIU/mL  Vitamin B12  Result Value Ref Range   Vitamin B-12 1,605 (H) 180 - 914 pg/mL  Troponin I (q 6hr x 3)  Result Value Ref Range   Troponin I 0.07 (HH) <0.03 ng/mL  Troponin I (q 6hr x 3)  Result Value Ref Range   Troponin I 0.08 (HH) <0.03 ng/mL  I-stat troponin, ED  Result Value Ref Range   Troponin i, poc 0.05 0.00 - 0.08 ng/mL   Comment 3          I-Stat CG4 Lactic Acid, ED  Result Value Ref Range   Lactic Acid, Venous 1.99 (H) 0.5 - 1.9 mmol/L  I-Stat CG4  Lactic Acid, ED  Result Value Ref Range   Lactic Acid, Venous 1.78 0.5 - 1.9 mmol/L     IM residents to come and evaluate patient.  Contact info: [phone]                       [phone]

## 2017-08-02 NOTE — Progress Notes (Signed)
PT Cancellation Note  Patient Details Name: [name] MRN: [id number] DOB: May 06, 1956   Cancelled Treatment:    Reason Eval/Treat Not Completed: Patient at procedure or test/unavailable(Pt in HD.  Will check back.  Thanks. )   Godfrey Pick [place] 08/02/2017, 8:58 AM  [facility] [phone] (pager)

## 2017-08-02 NOTE — Progress Notes (Signed)
Patient's heart rate was between 135bpm and 150 bpm.  Consulted with the leaders of the echo lab and will try to attempt the echo later, if HR drops.

## 2017-08-02 NOTE — Progress Notes (Signed)
   Subjective: No acute events overnight.  Nurse states his blood pressure improved this morning prior to going to dialysis with systolics 81E. PCCM evaluated pt overnight with question of appropriate floor status, he received a dose of IV steroids.  This morning, he denies any dizziness/lightheadedness or other specific complaints.  Objective:  Vital signs in last 24 hours: Vitals:   08/02/17 0500 08/02/17 0515 08/02/17 0551 08/02/17 0600  BP: (!) 73/54  (!) 80/58 (!) 75/62  Pulse:  (!) 119  (!) 124  Resp: 17 18 19 19   Temp:   97.8 F (36.6 C)   TempSrc:   Oral   SpO2:  96% (!) 84% (!) 84%  Weight:   187 lb 13.3 oz (85.2 kg)   Height:   5\' 10"  (1.778 m)    General: Elderly male resting comfortably undergoing HD, no acute distress CV: Tachycardic, regular on auscultation  Resp: Slight wheezing of anterior breath sounds, normal work of breathing, no distress  Abd: Soft, +BS, non-tender  Extr: Interval improvement in LE edema, chronic skin changes of venous stasis     Assessment/Plan:  Symptomatic Hypotension Patient has history of chronic hypotension currently on midodrine, states baseline pressures are around 56D-149F systolic.  Presented with a further decrease in blood pressure to systolics of 02O.  He has several comorbidities that can contribute to his hypotension, though there is not appear to be any acute changes in these conditions.  He received a dose of IV steroids to cover for potential adrenal insufficiency, though cortisol was wnl.  An echo is pending to assess for pericardial effusion or new abnormalities.  His blood pressure has improved with last recorded systolic of 378 and is tolerating HD. --Tele, monitor vitals --Cont home midodrine   --Hold further steroid doses and monitor --PT/OT --F/u blood cultures    ESRD s/p recent LUE AVF placement Patient has history of ESRD on MWF HD.  He did not receive Monday HD due to hypotension.  He had a left brachial AVF placed  with slight erythema, received cefazolin for potential infection.  Ultrasound showed small fluid collection of AC fossa around fistula.  Vascular surgery consulted and feel observed changes are within expected postoperative course. --HD per nephrology, appreciate assistance  --Outpatient f/u with [facility]  --Cont cefazolin for now   Chronic Atrial Flutter Patient has a history of chronic atrial flutter with multiple cardioversions and failed ablation at [facility].  Currently on metoprolol 12.5 twice daily for rate control which is limited due to hypotension.  Also on warfarin for anticoagulation (GI bleed in the past).  Cardiology consulted and given its chronicity, feel this is unlikely to be contributing to acute drop in BP.   Dispo: Anticipated discharge in approximately 1-2 day(s).   Tawny Asal, MD 08/02/2017, 8:35 AM Pager: [phone]

## 2017-08-02 NOTE — ED Notes (Signed)
Renal/Carb Modified diet breakfast tray ordered @ 0400.

## 2017-08-02 NOTE — Progress Notes (Signed)
Internal Medicine Attending:   I saw and examined the patient. I reviewed the resident's note and I agree with the resident's findings and plan as documented in the resident's note.  Patient feels better today with no new complaints.  He states his lightheadedness is now resolved.  His blood pressure is now improved with systolics in the 83K-18M.  Patient was initially admitted with symptomatic hypotension and was found to have systolic blood pressures in the 03F (baseline systolic blood pressures run 80s-100s).  Etiology of his hypotension remains uncertain at this time.  It does not appear to be secondary to an underlying infectious cause even though he may have a mild cellulitis which is being treated with Ancef.  He received a dose of IV steroids to cover for possible adrenal insufficiency but his cortisol was within normal limits.  Patient also has underlying chronic a flutter with heart rates in the 120s-150s which is likely contributing to his hypotension.  Unfortunately, this is difficult to control as we can not give him medications that could lower his blood pressure like beta-blockers or calcium channel blockers.  We will continue with midodrine for now.  Follow PT/OT recommendations.  Follow-up blood cultures.  We will follow-up 2D echo to rule out pericardial effusion or worsening valvular issues.  No further workup at this time.  If his blood pressures remain stable and his workup is negative patient will likely be discharged home tomorrow.

## 2017-08-02 NOTE — Progress Notes (Signed)
Went to bedside to examine patient and evaluate volume status. Patient denies SOB or difficulty breathing. BP 98M systolic, HR 210. Patient in NAD, lungs with expiratory wheezing bilaterally and scattered crackles at the bases bilaterally.   Plan: -500 cc bolus -given 1 dose of solu-cortef -d/c lopressor  -will continue to monitor BP   Arvil Chaco, MD Internal Medicine PGY1 Pager # [phone]

## 2017-08-02 NOTE — Progress Notes (Signed)
[facility]  Subjective  - feels ok   Objective (!) 80/70 73 (!) 97.5 F (36.4 C) (Oral) 20 (!) 84%  Intake/Output Summary (Last 24 hours) at 08/02/2017 1030 Last data filed at 08/01/2017 1500 Gross per 24 hour  Intake 500 ml  Output -  Net 500 ml   Left antecubital area mild erythema similar to description of Dr Early yesterday + thrill  Assessment/Planning: Healing left arm AV fistula.  Does not appear to have invasive infection. He has scheduled follow up with me in a few weeks. Call if questions  Ruta Hinds 08/02/2017 10:30 AM --  Laboratory Lab Results: Recent Labs    08/01/17 0801 08/02/17 0326  WBC 6.6 7.4  HGB 11.9* 11.8*  HCT 38.1* 36.7*  PLT 79* 85*   BMET Recent Labs    08/01/17 0801 08/02/17 0326  NA 138 137  K 4.8 5.2*  CL 101 103  CO2 23 21*  GLUCOSE 114* 107*  BUN 41* 50*  CREATININE 6.43* 7.25*  CALCIUM 6.7* 6.4*    COAG Lab Results  Component Value Date   INR 3.26 08/01/2017   INR 1.8 07/22/2017   INR 1.85 07/19/2017   No results found for: PTT

## 2017-08-02 NOTE — Progress Notes (Signed)
Attempted to do STAT echo in the morning at [facility].  Was instructed by the nurse to come back in the afternoon due to the patient going to dialysis.

## 2017-08-03 ENCOUNTER — Inpatient Hospital Stay (HOSPITAL_COMMUNITY): Payer: Medicare Other

## 2017-08-03 ENCOUNTER — Other Ambulatory Visit: Payer: Self-pay

## 2017-08-03 DIAGNOSIS — I4891 Unspecified atrial fibrillation: Secondary | ICD-10-CM

## 2017-08-03 DIAGNOSIS — I5020 Unspecified systolic (congestive) heart failure: Secondary | ICD-10-CM

## 2017-08-03 DIAGNOSIS — L02212 Cutaneous abscess of back [any part, except buttock]: Secondary | ICD-10-CM

## 2017-08-03 DIAGNOSIS — I340 Nonrheumatic mitral (valve) insufficiency: Secondary | ICD-10-CM

## 2017-08-03 LAB — CBC
HCT: 36.9 % — ABNORMAL LOW (ref 39.0–52.0)
Hemoglobin: 11.7 g/dL — ABNORMAL LOW (ref 13.0–17.0)
MCH: 34 pg (ref 26.0–34.0)
MCHC: 31.7 g/dL (ref 30.0–36.0)
MCV: 107.3 fL — AB (ref 78.0–100.0)
PLATELETS: 87 10*3/uL — AB (ref 150–400)
RBC: 3.44 MIL/uL — AB (ref 4.22–5.81)
RDW: 19.4 % — AB (ref 11.5–15.5)
WBC: 7 10*3/uL (ref 4.0–10.5)

## 2017-08-03 LAB — PROCALCITONIN: Procalcitonin: 0.56 ng/mL

## 2017-08-03 LAB — RENAL FUNCTION PANEL
Albumin: 2.7 g/dL — ABNORMAL LOW (ref 3.5–5.0)
Anion gap: 12 (ref 5–15)
BUN: 31 mg/dL — AB (ref 6–20)
CALCIUM: 7.2 mg/dL — AB (ref 8.9–10.3)
CHLORIDE: 104 mmol/L (ref 101–111)
CO2: 25 mmol/L (ref 22–32)
CREATININE: 5.22 mg/dL — AB (ref 0.61–1.24)
GFR calc Af Amer: 12 mL/min — ABNORMAL LOW (ref >60)
GFR calc non Af Amer: 11 mL/min — ABNORMAL LOW (ref >60)
GLUCOSE: 79 mg/dL (ref 65–99)
Phosphorus: 4.3 mg/dL (ref 2.5–4.6)
Potassium: 4 mmol/L (ref 3.5–5.1)
SODIUM: 141 mmol/L (ref 135–145)

## 2017-08-03 LAB — PROTIME-INR
INR: 2.31
PROTHROMBIN TIME: 25.2 s — AB (ref 11.4–15.2)

## 2017-08-03 LAB — LACTIC ACID, PLASMA: Lactic Acid, Venous: 1.8 mmol/L (ref 0.5–1.9)

## 2017-08-03 MED ORDER — LIDOCAINE HCL (PF) 1 % IJ SOLN: 5.0000 mL | INTRAMUSCULAR | Status: DC | PRN

## 2017-08-03 MED ORDER — SODIUM CHLORIDE 0.9 % IV SOLN: 100.0000 mL | INTRAVENOUS | Status: DC | PRN

## 2017-08-03 MED ORDER — HEPARIN SODIUM (PORCINE) 1000 UNIT/ML DIALYSIS: 1000.0000 [IU] | INTRAMUSCULAR | Status: DC | PRN

## 2017-08-03 MED ORDER — PENTAFLUOROPROP-TETRAFLUOROETH EX AERO: 1.0000 "application " | INHALATION_SPRAY | CUTANEOUS | Status: DC | PRN

## 2017-08-03 MED ORDER — WARFARIN SODIUM 5 MG PO TABS
5.0000 mg | ORAL_TABLET | Freq: Once | ORAL | Status: AC
  Administered 2017-08-03: 5 mg via ORAL
  Filled 2017-08-03 (×2): qty 1

## 2017-08-03 MED ORDER — LIDOCAINE-PRILOCAINE 2.5-2.5 % EX CREA: 1.0000 "application " | TOPICAL_CREAM | CUTANEOUS | Status: DC | PRN

## 2017-08-03 MED ORDER — ALTEPLASE 2 MG IJ SOLR: 2.0000 mg | Freq: Once | INTRAMUSCULAR | Status: DC | PRN

## 2017-08-03 MED ORDER — MIDODRINE HCL 5 MG PO TABS
ORAL_TABLET | ORAL | Status: AC
  Administered 2017-08-03: 10 mg via ORAL
  Filled 2017-08-03: qty 2

## 2017-08-03 NOTE — Progress Notes (Signed)
OT Cancellation Note  Patient Details Name: Zed Wanninger MRN: [id number] DOB: 06/28/1956   Cancelled Treatment:    Reason Eval/Treat Not Completed: Patient at procedure or test/ unavailable(at HD). OT will continue to follow for eval as schedule allows.  Merri Ray Arvis Zwahlen 08/03/2017, 10:06 AM  Hulda Humphrey OTR/L [phone]

## 2017-08-03 NOTE — Procedures (Signed)
Incision and Drainage Procedure Note  Pre-operative Diagnosis: Superficial abscess  Post-operative Diagnosis: same  Indications: Abscess   Anesthesia: 5cc 1% Lidocaine   Procedure Details  The procedure, risks and complications have been discussed in detail (including but not limited to infection, bleeding) with the patient, and the patient has signed consent to the procedure.  The skin was sterilely prepped over the affected area. After adequate local anesthesia, I&D with a #11 blade was performed on the left upper back overlying scapula. Purulent drainage: present The patient remained stable throughout procedure. Wound packing was not placed due to superficial nature of incision and cavity. Dressing applied   Findings: 10-15 CC purulent/serosanguinous drainage    Condition: Tolerated procedure well   Complications: None

## 2017-08-03 NOTE — Progress Notes (Signed)
PT Cancellation Note  Patient Details Name: Wissam Resor MRN: [id number] DOB: August 25, 1956   Cancelled Treatment:    Reason Eval/Treat Not Completed: Patient at procedure or test/unavailable(Pt in HD. Will check back as able. )   Denice Paradise 08/03/2017, 10:39 AM [facility] [phone] (pager)

## 2017-08-03 NOTE — Progress Notes (Signed)
Progress Note  Patient Name: [name] Date of Encounter: 08/03/2017  Primary Cardiologist: Angelena Form   Subjective   [name] is a 61 year old gentleman with a history of coronary artery disease, persistent atrial fibrillation/flutter, end-stage renal disease and severe mitral regurgitation.  Admitted with hypertension. This hypertension started shortly after he received his new dialysis fistula.  Inpatient Medications    Scheduled Meds: . arformoterol  15 mcg Nebulization BID  . atorvastatin  20 mg Oral QHS  . citalopram  20 mg Oral QHS  . midodrine  10 mg Oral TID  . pantoprazole  40 mg Oral BID  . umeclidinium bromide  1 puff Inhalation Daily  . Warfarin - Pharmacist Dosing Inpatient   Does not apply q1800   Continuous Infusions: . sodium chloride    . sodium chloride    .  ceFAZolin (ANCEF) IV Stopped (08/02/17 1300)  . ferric gluconate (FERRLECIT/NULECIT) IV     PRN Meds: sodium chloride, sodium chloride, acetaminophen **OR** acetaminophen, albuterol, alteplase, heparin, lidocaine (PF), lidocaine-prilocaine, pentafluoroprop-tetrafluoroeth   Vital Signs    Vitals:   08/03/17 0820 08/03/17 0826 08/03/17 0844 08/03/17 0900  BP:  (!) 80/63 (!) 83/65 (!) 80/60  Pulse:  (!) 115 (!) 117 (!) 124  Resp:      Temp:      TempSrc:  Oral    SpO2: 92% 92%    Weight:  181 lb 10.5 oz (82.4 kg)    Height:        Intake/Output Summary (Last 24 hours) at 08/03/2017 0940 Last data filed at 08/03/2017 0030 Gross per 24 hour  Intake 720 ml  Output 2509 ml  Net -1789 ml   Filed Weights   08/02/17 1113 08/03/17 0600 08/03/17 0826  Weight: 180 lb 12.4 oz (82 kg) 183 lb 3.2 oz (83.1 kg) 181 lb 10.5 oz (82.4 kg)    Telemetry    Atrial fib/ flutter   - Personally Reviewed  ECG     atrial fib / flutter   - Personally Reviewed  Physical Exam   GEN: No acute distress.  Examined in dialysis. Neck: No JVD Cardiac: RRR, tachycardic.  He has a 1/6 to 2/6 systolic  murmur. Respiratory: Clear to auscultation bilaterally. GI: Soft, nontender, non-distended  MS: No edema; No deformity. Neuro:  Nonfocal  Psych: Normal affect   Labs    Chemistry Recent Labs  Lab 08/01/17 0801 08/02/17 0326  NA 138 137  K 4.8 5.2*  CL 101 103  CO2 23 21*  GLUCOSE 114* 107*  BUN 41* 50*  CREATININE 6.43* 7.25*  CALCIUM 6.7* 6.4*  ALBUMIN  --  2.7*  GFRNONAA 8* 7*  GFRAA 10* 8*  ANIONGAP 14 13     Hematology Recent Labs  Lab 08/01/17 0801 08/02/17 0326 08/03/17 0902  WBC 6.6 7.4 7.0  RBC 3.51* 3.40* 3.44*  HGB 11.9* 11.8* 11.7*  HCT 38.1* 36.7* 36.9*  MCV 108.5* 107.9* 107.3*  MCH 33.9 34.7* 34.0  MCHC 31.2 32.2 31.7  RDW 19.4* 19.4* 19.4*  PLT 79* 85* 87*    Cardiac Enzymes Recent Labs  Lab 08/01/17 1656 08/01/17 2139 08/02/17 0326  TROPONINI 0.07* 0.08* 0.08*    Recent Labs  Lab 08/01/17 0813  TROPIPOC 0.05     BNP Recent Labs  Lab 08/01/17 1002  BNP 1,426.5*     DDimer No results for input(s): DDIMER in the last 168 hours.   Radiology    No results found.  Cardiac Studies  Patient Profile     60 y.o. male with coronary artery disease, end-stage renal disease, severe mitral regurgitation.  He is continued to have problems with hypotension especially with dialysis.  Assessment & Plan    1.  Hypertension: The etiology of this is likely multifactorial.  The symptoms seem to have worsened right after he received his new dialysis fistula.  He may have developed high output congestive heart failure.  He also has severe mitral regurgitation by TEE in April of this year.  We may need to perform a right and left heart catheterization to see if he indeed has high output failure.  Repeat echocardiogram has been ordered for today. Patient has been on Midrin but this is not helped very much which would also suggest that the etiology of this is his new dialysis fistula.  2.  Coronary artery disease: He is not having any  episodes of angina  B.  Mitral regurgitation: He may be a candidate for mitral clip. Reviewed with the interventional team.   For questions or updates, please contact [place] Please consult [email or web address] for contact info under Cardiology/STEMI.      Signed, Mertie Moores, MD  08/03/2017, 9:40 AM

## 2017-08-03 NOTE — Progress Notes (Signed)
ANTICOAGULATION CONSULT NOTE - Follow-up Consult  Pharmacy Consult for Warfarin Indication: atrial fibrillation  Labs: Recent Labs    08/01/17 0801 08/01/17 1002 08/01/17 1656 08/01/17 2139 08/02/17 0326 08/02/17 1216 08/03/17 0432 08/03/17 0902  HGB 11.9*  --   --   --  11.8*  --   --  11.7*  HCT 38.1*  --   --   --  36.7*  --   --  36.9*  PLT 79*  --   --   --  85*  --   --  87*  LABPROT  --  33.0*  --   --   --  34.3* 25.2*  --   INR  --  3.26  --   --   --  3.43 2.31  --   CREATININE 6.43*  --   --   --  7.25*  --   --  5.22*  TROPONINI  --   --  0.07* 0.08* 0.08*  --   --   --    Assessment: 85 YOM here with dizziness and SOB. Pharmacy consulted to resume home warfarin therapy for h/o AFib. INR on admission is supratherapeutic at 3.26.  Today his INR dropped from 3.4 to 2.3 after holding two doses of his warfarin. CBC is stable. Will restart his warfarin today slightly higher than his home dose due to the sharp drop in INR. His total weekly dose will likely be lower than his home regimen.  Home warfarin dose: 7.5 mg on Mon; 3.75 mg on all other days.   Goal of Therapy:  INR 2-3 Monitor platelets by anticoagulation protocol: Yes   Plan:  Warfarin 5mg  x1 Daily INR, Kelly PharmD PGY1 Pharmacy Practice Resident 08/03/2017 10:43 AM Pager: [phone]

## 2017-08-03 NOTE — Progress Notes (Signed)
[facility] Progress Note  Subjective: no new c/o, BP 80's on HD, not symptomatic, HR 120's  Vitals:   08/03/17 0844 08/03/17 0900 08/03/17 0930 08/03/17 1000  BP: (!) 83/65 (!) 80/60 (!) 78/64 (!) 79/63  Pulse: (!) 117 (!) 124 (!) 115 (!) 114  Resp:      Temp:      TempSrc:      SpO2:      Weight:      Height:        Inpatient medications: . arformoterol  15 mcg Nebulization BID  . atorvastatin  20 mg Oral QHS  . citalopram  20 mg Oral QHS  . midodrine  10 mg Oral TID  . pantoprazole  40 mg Oral BID  . umeclidinium bromide  1 puff Inhalation Daily  . Warfarin - Pharmacist Dosing Inpatient   Does not apply q1800   . sodium chloride    . sodium chloride    .  ceFAZolin (ANCEF) IV Stopped (08/02/17 1300)  . ferric gluconate (FERRLECIT/NULECIT) IV     sodium chloride, sodium chloride, acetaminophen **OR** acetaminophen, albuterol, alteplase, heparin, lidocaine (PF), lidocaine-prilocaine, pentafluoroprop-tetrafluoroeth  Exam: Alert, calm, no distress No jvd Chest faint rales R base, L clear RRR 1/6 sem Abd soft no ascites Ext dense 2+ bilat pitting thigh/ hip edema LUA AVF +bruit, +erythema (mild) NF, ox 3  Dialysis: [facility] MWF 4h   77kg   2K/ 3Ca bath   Hep none  R IJ TDC / (maturing LUA AVF 07/19/17) -venofer 50/wk      Impression: 1. HYpotension - acute on chronic issue. Possible related to recent AVF placement/ afterload reduction.  No sign of sepsis.  Have d/w cardiology and primary team.   2. ESRD on HD MWF - has been on HD 19 yrs. 3. Volume excess - dense pitting edema dependent soft tissues 4. COPD - on home O2 5. Pulm HTN 6. Aflutter / fib - hx failed ablation, can't take amio due to lung disease.  Home metoprolol is on hold.  7. Mild cellulitis - L arm at AVF wound site. On empiric abx.  8. Anemia of CKD - no esa needed, Hb up 9. MBD - cont meds 10. CAD hx CABG/ DES  Plan - HD today, UF 2.5 L as tol.     Kelly Splinter MD [facility] pager [phone]   08/03/2017, 10:33 AM   Recent Labs  Lab 08/01/17 0801 08/02/17 0326 08/03/17 0902  NA 138 137 141  K 4.8 5.2* 4.0  CL 101 103 104  CO2 23 21* 25  GLUCOSE 114* 107* 79  BUN 41* 50* 31*  CREATININE 6.43* 7.25* 5.22*  CALCIUM 6.7* 6.4* 7.2*  PHOS  --  5.8* 4.3   Recent Labs  Lab 08/02/17 0326 08/03/17 0902  ALBUMIN 2.7* 2.7*   Recent Labs  Lab 08/01/17 0801 08/02/17 0326 08/03/17 0902  WBC 6.6 7.4 7.0  HGB 11.9* 11.8* 11.7*  HCT 38.1* 36.7* 36.9*  MCV 108.5* 107.9* 107.3*  PLT 79* 85* 87*   Iron/TIBC/Ferritin/ %Sat No results found for: IRON, TIBC, FERRITIN, IRONPCTSAT

## 2017-08-03 NOTE — Consult Note (Signed)
Name: Markevious Ehmke MRN: [id number] DOB: 11-08-1955    ADMISSION DATE:  08/01/2017 CONSULTATION DATE:  08/02/2017  REFERRING MD :  Dr. Dareen Piano   CHIEF COMPLAINT:  Dizziness and hypotension  HISTORY OF PRESENT ILLNESS:   61 year old male with complex medical history as listed below who presents with 3 week history of dizziness and dyspnea on exertion.  States about the time it started is when he had his LUE fistula placed.  He briefly took trazodone but stopped that.  Denies fever, chills, chest pain, vomiting, or diarrhea.  Is MWF dialysis patient.  Last dialysis on Friday, 11/23.  Went to dialysis on Monday but was sent to ER for hypotension.  Patients normal SBP is in the 80-90's and is on midodrine at home, only missing one dose on Monday.  On admit, patient afebrile, tachycardic in Aflutter around 110-120 with labs noted for WBC 7.4, lactic acid 1.78, cortisol level 14.6, am level 11.6, CXR with non-acute process.  He was given two NS 500 ml boluses and stress dose steroids with minimal improvement.  However patient remains alert and oriented.   Admitted to step-down to internal medicine.  PCCM consulted for hypotension.    PAST MEDICAL HISTORY :   has a past medical history of Acute blood loss anemia, Anemia, Anxiety, Aortic heart valve prolapse (04/2013), Bifascicular block, CAD (coronary artery disease) (04/2013), Cancer ([place]), Carotid artery disease ([place]), CHF (congestive heart failure) ([place]), Chronic respiratory failure ([place]), COPD (chronic obstructive pulmonary disease) ([place]), Dyspnea, ESRD on hemodialysis (02/12/2012), GERD (gastroesophageal reflux disease), Hematoma, Hypertension, Multiple sclerosis ([place]), Myocardial infarction ([name]), Nocturnal hypoxemia, On home oxygen therapy, PAF (paroxysmal atrial fibrillation) ([place]), Paroxysmal atrial flutter ([place]), Peripheral vascular disease ([place]), Pneumonia, Renal transplant failure and rejection, 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  has a past surgical history that includes AV fistula placement; flash; Kidney transplant (08/2007); excised squamous cells at rectum (2006); left heart catheterization with coronary angiogram (N/A, 01/09/2013); Cardiac catheterization (N/A, 05/26/2015); Cardiac catheterization (N/A, 05/26/2015); Cardiac catheterization (N/A, 02/15/2016); Coronary artery bypass graft; Cardioversion (N/A, 07/12/2016); Cardioversion (N/A, 11/11/2016); TEE without cardioversion (N/A, 11/11/2016); Knee surgery; TEE without cardioversion (N/A, 12/09/2016); Cardioversion (N/A, 12/09/2016); Coronary angioplasty; Parathyroidectomy; Hernia repair (07/2011); Appendectomy; Colonoscopy; Esophagogastroduodenoscopy endoscopy; and AV fistula placement (Left, 07/19/2017). Prior to Admission medications   Medication Sig Start Date End Date Taking? Authorizing Provider  acetaminophen (TYLENOL) 500 MG tablet Take 1,000 mg every 6 (six) hours as needed by mouth for mild pain.    Yes [provider]  albuterol (ACCUNEB) 1.25 MG/3ML nebulizer solution Take 3 mLs (1.25 mg total) by nebulization every 6 (six) hours as needed for wheezing. 06/16/17  Yes Tanda Rockers, MD  albuterol (PROAIR HFA) 108 (90 Base) MCG/ACT inhaler Inhale 2 puffs every 6 (six) hours as needed into the lungs for wheezing or shortness of breath. 07/19/17  Yes Rhyne, Hulen Shouts, PA-C  atorvastatin (LIPITOR) 40 MG tablet Take 0.5 tablets (20 mg total) by mouth at bedtime. 05/06/17  Yes Burnell Blanks, MD  calcium acetate (PHOSLO) 667 MG capsule Take 4,002 mg by mouth 3 (three) times daily with meals.    Yes [provider]  citalopram (CELEXA) 20 MG tablet Take 20 mg by mouth at bedtime.    Yes [provider]  diphenhydrAMINE (BENADRYL) 2 % cream Apply 1 application topically daily as needed for itching.   Yes [provider]  docusate sodium (COLACE) 100 MG  capsule Take 100 mg  by mouth 2 (two) times daily.    Yes [provider]  folic acid (FOLVITE) 1 MG tablet Take 1 mg by mouth daily.    Yes [provider]  gabapentin (NEURONTIN) 300 MG capsule Take 300 mg by mouth at bedtime. Patient taking two 300mg  capsules at bedtime sometime.   Yes [provider]  metoprolol tartrate (LOPRESSOR) 25 MG tablet Take 12.5 mg by mouth 2 (two) times daily.   Yes [provider]  midodrine (PROAMATINE) 10 MG tablet Take 10 mg by mouth 3 (three) times daily.   Yes [provider]  multivitamin (RENA-VIT) TABS tablet Take 1 tablet by mouth daily. 01/11/13  Yes Samella Parr, NP  OXYGEN 2lpm with sleep and "at home"   Yes [provider]  oxymetazoline (AFRIN) 0.05 % nasal spray Place 1 spray into both nostrils 2 (two) times daily as needed for congestion.   Yes [provider]  pantoprazole (PROTONIX) 40 MG tablet Take 1 tablet (40 mg total) by mouth 2 (two) times daily. 02/09/17  Yes Burnell Blanks, MD  polyethylene glycol (MIRALAX / GLYCOLAX) packet Take 17 g daily as needed by mouth for mild constipation. Mix in 8 oz liquid and drink    Yes [provider]  STIOLTO RESPIMAT 2.5-2.5 MCG/ACT AERS INHALE 2 PUFFS BY MOUTH DAILY 07/06/17  Yes [provider]  warfarin (COUMADIN) 7.5 MG tablet Take 0.5-1 tablets (3.75-7.5 mg total) See admin instructions by mouth. Patient taking differently: Take 3.75-7.5 mg by mouth See admin instructions. On Monday (only) he takes one tablet 7.5mg . 07/19/17  Yes Rhyne, Hulen Shouts, PA-C  nitroGLYCERIN (NITROSTAT) 0.4 MG SL tablet Place 0.4 mg under the tongue every 5 (five) minutes as needed for chest pain (max 3 doses - if no relief call MD).     [provider]  oxyCODONE (ROXICODONE) 5 MG immediate release tablet Take 1 tablet (5 mg total) every 6 (six) hours as needed by mouth. 07/19/17   Rhyne, Hulen Shouts, PA-C   Allergies  Allergen Reactions  . Adhesive  [Tape] Rash    Please use paper tape  . Amoxicillin Rash    migraine  . Ativan [Lorazepam] Anxiety  . Bupropion Anxiety  . Diphenhydramine Itching and Anxiety    Only with IV doses. Tolerates oral.  . Penicillins Other (See Comments)    MIGRAINE  Has patient had a PCN reaction causing immediate rash, facial/tongue/throat swelling, SOB or lightheadedness with hypotension: no Has patient had a PCN reaction causing severe rash involving mucus membranes or skin necrosis: No Has patient had a PCN reaction that required hospitalization No Has patient had a PCN reaction occurring within the last 10 years: No If all of the above answers are "NO", then may proceed with Cephalosporin use.    FAMILY HISTORY:  family history includes Diabetes in his father; Emphysema in his mother; Heart attack in his maternal aunt, maternal grandfather, and maternal uncle; Heart failure in his mother; Hypertension in his maternal aunt; Lupus in his sister. SOCIAL HISTORY:  reports that he quit smoking about 19 years ago. His smoking use included cigarettes. He has a 40.00 pack-year smoking history. he has never used smokeless tobacco. He reports that he does not drink alcohol or use drugs.  .  SUBJECTIVE:  On hD MAp slight up Awake, laert feelsbetter  VITAL SIGNS: Temp:  [98.1 F (36.7 C)-98.9 F (37.2 C)] 98.1 F (36.7 C) (11/28 1220) Pulse Rate:  [  67-127] 113 (11/28 1220) Resp:  [15-30] 22 (11/28 1220) BP: (68-97)/(56-74) 90/69 (11/28 1220) SpO2:  [91 %-98 %] 92 % (11/28 1214) Weight:  [80 kg (176 lb 5.9 oz)-83.1 kg (183 lb 3.2 oz)] 80 kg (176 lb 5.9 oz) (11/28 1214)  General: awake, on HD Neuro: nonfocal HEENT: jvd wnl or up, no stridor PULM: CTA without crackles CV:  s1 s2 RRR distant GI: softr, Bs wnl, no r Extremities: Left uppe ext new graft not awrm, mild erythema, NO discharge, appears ot be healing well Back left upper fluid collection 5-6 cm , flucutant  Recent Labs  Lab  08/01/17 0801 08/02/17 0326 08/03/17 0902  NA 138 137 141  K 4.8 5.2* 4.0  CL 101 103 104  CO2 23 21* 25  BUN 41* 50* 31*  CREATININE 6.43* 7.25* 5.22*  GLUCOSE 114* 107* 79   Recent Labs  Lab 08/01/17 0801 08/02/17 0326 08/03/17 0902  HGB 11.9* 11.8* 11.7*  HCT 38.1* 36.7* 36.9*  WBC 6.6 7.4 7.0  PLT 79* 85* 87*   No results found.  ASSESSMENT / PLAN:  Chronic Hypotension - ESRD on midodrine Rule out related to worsening pulm htn with new graft Back abscess, carbuncle P:  Not septic appearing, no fevers Echo awaited MAP goal 55 with good MS Obtain pct Would have back abscess evalauted and drained , I doubt this is the source of low BP however Ancef maintain Midodrine PCt assessment The ext graft looks great, unlikely source No need ICu transfer likley is pre load dependent with PA htn, caution volume removal I uddated pt in full  [employer]. Titus Mould, MD, Reader Pgr: [facility]

## 2017-08-03 NOTE — Progress Notes (Signed)
Internal Medicine Attending:   I saw and examined the patient. I reviewed the resident's note and I agree with the resident's findings and plan as documented in the resident's note.  Patient feels well today with no new complaints.  He denies any recurrent episodes of lightheadedness but has not tried to stand or exert himself.  Patient was initially admitted for symptomatic hypotension the etiology of which remains unclear.  His systolic blood pressures have been in the 80s today which is mildly improved from admission.  Case was discussed with PCCM and nephrology.  The symptoms began after the placement of his new AV fistula and it is possible that he developed a high output heart failure and low blood pressures from this.  Cardiology follow-up and recommendations appreciated.  Patient may require right and left heart cath to see if he does have high output failure.  Patient will need a 2D echo done today.  He also has history of mitral regurgitation and may be candidate for mitral clip.  PT/OT to evaluate patient today and see if symptoms recur with ambulation.  Continue hemodialysis for now per nephrology.  Of note, patient has some mild erythema around the left AV fistula site as well as possible abscess over his left upper back.  We will continue with Ancef for now and  proceed with an I&D of the likely abscess of his left upper back.  Patient also noted to be tachycardic with heart rates in the 120s-130s secondary to his recurrent a flutter which has failed multiple cardioversions and an ablation.  Continue with anticoagulation for now.  Patient unable to tolerate rate control medication secondary to his low blood pressure.  He cannot take amiodarone secondary to the lung disease.  We will continue to monitor him for now.

## 2017-08-03 NOTE — Progress Notes (Signed)
   Subjective: No acute events overnight. Pt reports he is overall feeling well with no dizziness, however, he has not tried to stand or exert himself. PT has been unable to evaluate due to dialysis sessions. Echo pending    Objective:  Vital signs in last 24 hours: Vitals:   08/03/17 0930 08/03/17 1000 08/03/17 1030 08/03/17 1100  BP: (!) 78/64 (!) 79/63 (!) 71/59 (!) 81/60  Pulse: (!) 115 (!) 114 (!) 115 (!) 113  Resp:      Temp:      TempSrc:      SpO2:      Weight:      Height:       General: Elderly male resting comfortably undergoing HD, no acute distress CV: Tachycardic, regular on auscultation  Resp: Clear anterior breath sounds, normal work of breathing, no distress  Abd: Soft, +BS, non-tender  Extr: Ted hose in place Skin: Superficial abscess over L scapula with redness, no significant warmth, no drainage. POC US revealed simple fluid     Assessment/Plan:  Symptomatic Hypotension in setting of HFrEF, Mitral regurg   Patient has history of chronic hypotension currently on midodrine, states baseline pressures are around 86V-672C systolic.  Presented with a further decrease in blood pressure to systolics of 94B.  He has several comorbidities that can contribute to his hypotension, though there is not appear to be any acute changes in these conditions. There is concern that his recently placed AVF may be contributing to a worsening of his HF d/t high output. He has an echo pending and cardiology may pursue further workup to evaluate his cardiac function. Will discuss with VVS as well.  His blood pressure on rounds this morning was 80/60 during HD.  --Tele, monitor vitals --F/u Echo  --Cont home midodrine   --PT/OT  ESRD s/p recent LUE AVF placement Patient has history of ESRD on MWF HD.  He did not receive HD prior to presentation due to hypotension. Pt had a LUE AVF recently placed, site appeared slightly erythematous and ultrasound showed small fluid collection of AC fossa  around fistula.  Vascular surgery consulted and feel observed changes are within expected postoperative course. Will discuss regarding potential HF implications as described above.  --HD per nephrology, appreciate assistance   Chronic Atrial Flutter Patient has a history of chronic atrial flutter with multiple cardioversions and failed ablation at [facility].  Currently on metoprolol 12.5 twice daily for rate control which is limited due to hypotension.  Also on warfarin for anticoagulation (GI bleed in the past).  Cardiology consulted and given its chronicity, feel other factors are more likely to be contributing to his hypotension.   Superficial Skin Abscess Pt has a superficial abscess on his back, overlying L scapula. Bedside US revealed simple appearing fluid that would be amenable to drainage. Will perform I+D and continue cefazolin for now.    Dispo: Anticipated discharge in approximately 2-3 day(s).   Tawny Asal, MD 08/03/2017, 11:31 AM Pager: [phone]

## 2017-08-03 NOTE — Plan of Care (Signed)
Progressing Education: Knowledge of General Education information will improve 08/03/2017 2105 - Progressing by Levonne Hubert, RN Pt is accepting of his medical conditions and participates in education. Health Behavior/Discharge Planning: Ability to manage health-related needs will improve 08/03/2017 2105 - Progressing by Levonne Hubert, RN Clinical Measurements: Ability to maintain clinical measurements within normal limits will improve 08/03/2017 2105 - Progressing by Levonne Hubert, RN BP improved, HR continues to be elevated 110s-120s Will remain free from infection 08/03/2017 2105 - Progressing by Levonne Hubert, RN Left scapula I&D today by MD. Diagnostic test results will improve 08/03/2017 2105 - Progressing by Levonne Hubert, RN Respiratory complications will improve 08/03/2017 2105 - Progressing by Levonne Hubert, RN Cardiovascular complication will be avoided 08/03/2017 2105 - Progressing by Levonne Hubert, RN Activity: Risk for activity intolerance will decrease 08/03/2017 2105 - Progressing by Levonne Hubert, RN Nutrition: Adequate nutrition will be maintained 08/03/2017 2105 - Progressing by Levonne Hubert, RN Coping: Level of anxiety will decrease 08/03/2017 2105 - Progressing by Levonne Hubert, RN Elimination: Will not experience complications related to bowel motility 08/03/2017 2105 - Progressing by Levonne Hubert, RN Will not experience complications related to urinary retention 08/03/2017 2105 - Progressing by Levonne Hubert, RN Pain Managment: General experience of comfort will improve 08/03/2017 2105 - Progressing by Levonne Hubert, RN Safety: Ability to remain free from injury will improve 08/03/2017 2105 - Progressing by Levonne Hubert, RN Skin Integrity: Risk for impaired skin integrity will decrease 08/03/2017 2105 - Progressing by Levonne Hubert, RN Left scapula I&D today  by MD.

## 2017-08-03 NOTE — Progress Notes (Signed)
  Echocardiogram 2D Echocardiogram has been performed.  [name] 08/03/2017, 5:49 PM

## 2017-08-04 DIAGNOSIS — I272 Pulmonary hypertension, unspecified: Secondary | ICD-10-CM

## 2017-08-04 DIAGNOSIS — I509 Heart failure, unspecified: Secondary | ICD-10-CM

## 2017-08-04 LAB — ECHOCARDIOGRAM COMPLETE
AO mean calculated velocity dopler: 142 cm/s
AOPV: 0.55 m/s
AOVTI: 33.7 cm
AV Area VTI index: 1.07 cm2/m2
AV Area mean vel: 2.08 cm2
AV Mean grad: 9 mmHg
AV Peak grad: 15 mmHg
AV VEL mean LVOT/AV: 0.55
AV area mean vel ind: 1.05 cm2/m2
AV peak Index: 1.06
AV pk vel: 193 cm/s
AV vel: 2.12
AVAREAVTI: 2.09 cm2
CHL CUP DOP CALC LVOT VTI: 18.8 cm
CHL CUP LVOT MV VTI INDEX: 0.9 cm2/m2
FS: 22 % — AB (ref 28–44)
HEIGHTINCHES: 70 in
IVS/LV PW RATIO, ED: 0.83
LA ID, A-P, ES: 51 mm
LA vol index: 56.6 mL/m2
LA vol: 112 mL
LADIAMINDEX: 2.58 cm/m2
LAVOLA4C: 97 mL
LEFT ATRIUM END SYS DIAM: 51 mm
LVOT MV VTI: 1.78
LVOT area: 3.8 cm2
LVOT peak VTI: 0.56 cm
LVOT peak vel: 106 cm/s
LVOTD: 22 mm
LVOTSV: 71 mL
MV M vel: 164
MVANNULUSVTI: 40.2 cm
Mean grad: 13 mmHg
PW: 12 mm — AB (ref 0.6–1.1)
Valve area index: 1.07
Valve area: 2.12 cm2
WEIGHTICAEL: 2821.89 [oz_av]

## 2017-08-04 LAB — PROTIME-INR
INR: 1.61
PROTHROMBIN TIME: 19 s — AB (ref 11.4–15.2)

## 2017-08-04 LAB — PROCALCITONIN: PROCALCITONIN: 0.69 ng/mL

## 2017-08-04 MED ORDER — WARFARIN SODIUM 5 MG PO TABS
5.0000 mg | ORAL_TABLET | Freq: Once | ORAL | Status: AC
  Administered 2017-08-04: 5 mg via ORAL
  Filled 2017-08-04: qty 1

## 2017-08-04 NOTE — Progress Notes (Addendum)
Progress Note  Patient Name: [name] Date of Encounter: 08/04/2017  Primary Cardiologist: Angelena Form  Subjective   61 year old gentleman with a history of atrial flutter, severe mitral regurgitation, chronic kidney disease.  He has been having hypotension and dialysis recently.  The symptoms seem to have worsened after he had his most recent AV fistula placed.    Inpatient Medications    Scheduled Meds: . arformoterol  15 mcg Nebulization BID  . atorvastatin  20 mg Oral QHS  . citalopram  20 mg Oral QHS  . midodrine  10 mg Oral TID  . pantoprazole  40 mg Oral BID  . umeclidinium bromide  1 puff Inhalation Daily  . warfarin  5 mg Oral ONCE-1800  . Warfarin - Pharmacist Dosing Inpatient   Does not apply q1800   Continuous Infusions: .  ceFAZolin (ANCEF) IV Stopped (08/03/17 1347)  . ferric gluconate (FERRLECIT/NULECIT) IV Stopped (08/03/17 1235)   PRN Meds: acetaminophen **OR** acetaminophen, albuterol   Vital Signs    Vitals:   08/04/17 0534 08/04/17 0800 08/04/17 0958 08/04/17 0959  BP: (!) 77/63     Pulse:      Resp: 20     Temp:  98.8 F (37.1 C)    TempSrc:  Oral    SpO2: 96%  94% 94%  Weight:      Height:        Intake/Output Summary (Last 24 hours) at 08/04/2017 1024 Last data filed at 08/03/2017 2030 Gross per 24 hour  Intake 835 ml  Output 2500 ml  Net -1665 ml   Filed Weights   08/03/17 0826 08/03/17 1214 08/04/17 0348  Weight: 181 lb 10.5 oz (82.4 kg) 176 lb 5.9 oz (80 kg) 181 lb 7 oz (82.3 kg)    Telemetry    Atrial flutter - Personally Reviewed  ECG     atrial flutter  - Personally Reviewed  Physical Exam   GEN: No acute distress.   Neck: No JVD Cardiac: RR,  3-5/0  systolic murmur  Respiratory: Clear to auscultation bilaterally. GI: Soft, nontender, non-distended  MS: No edema; No deformity. Neuro:  Nonfocal  Psych: Normal affect   Labs    Chemistry Recent Labs  Lab 08/01/17 0801 08/02/17 0326 08/03/17 0902  NA  138 137 141  K 4.8 5.2* 4.0  CL 101 103 104  CO2 23 21* 25  GLUCOSE 114* 107* 79  BUN 41* 50* 31*  CREATININE 6.43* 7.25* 5.22*  CALCIUM 6.7* 6.4* 7.2*  ALBUMIN  --  2.7* 2.7*  GFRNONAA 8* 7* 11*  GFRAA 10* 8* 12*  ANIONGAP 14 13 12      Hematology Recent Labs  Lab 08/01/17 0801 08/02/17 0326 08/03/17 0902  WBC 6.6 7.4 7.0  RBC 3.51* 3.40* 3.44*  HGB 11.9* 11.8* 11.7*  HCT 38.1* 36.7* 36.9*  MCV 108.5* 107.9* 107.3*  MCH 33.9 34.7* 34.0  MCHC 31.2 32.2 31.7  RDW 19.4* 19.4* 19.4*  PLT 79* 85* 87*    Cardiac Enzymes Recent Labs  Lab 08/01/17 1656 08/01/17 2139 08/02/17 0326  TROPONINI 0.07* 0.08* 0.08*    Recent Labs  Lab 08/01/17 0813  TROPIPOC 0.05     BNP Recent Labs  Lab 08/01/17 1002  BNP 1,426.5*     DDimer No results for input(s): DDIMER in the last 168 hours.   Radiology    No results found.  Cardiac Studies   Echocardiogram reveals  Patient Profile     61 y.o. male with hx of  CAD, ESRD, atrial flutter   Assessment & Plan    1.  Hypertension: I reviewed the echocardiogram.  The patient has moderate to severe pulmonary hypertension.  The  right ventricle is severely dilated and severely hypocontractile. I suspect that his RV failure is the reason for his persistent hypotension.  This also may be exacerbated by his newly placed AV fistula.  Given the scenario, he is very volume sensitive.  Aggressive or even normal dialysis is likely to cause hypotension.    2.    Atrial flutter:  HR has been fairly well controlled  3. CAD :  No angina   4.  ESRD:   Has had significant hypotension with dialysis. ? Exacerbated by his new AV fistula  Our options are very limited. I think a Palliative care consult is indicated      For questions or updates, please contact [place] Please consult [email or web address] for contact info under Cardiology/STEMI.      Signed, Mertie Moores, MD  08/04/2017, 10:24 AM

## 2017-08-04 NOTE — Evaluation (Signed)
Occupational Therapy Evaluation Patient Details Name: [name] MRN: [id number] DOB: Feb 18, 1956 Today's Date: 08/04/2017    History of Present Illness Patient is a 61 year old male with a past medical history of CAD status post two-vessel CABG in 2014, CHF, COPD on home O2, multiple sclerosis, squamous cell carcinoma of the lung status post radiation therapy, peripheral vascular disease and chronic hypotension. New left brachial AV fistula placed approximately 3 weeks ago and has been complaining of some lightheadedness since the procedure when standing and during ambulation.   Clinical Impression   PTA Pt mostly modified independent in ADL (wife does assist with bathing) and using no DME for mobility. Pt is currently supervision for bed level ADL. Pt agreeable and able to come and sit EOB at min guard/supervision level. Close monitoring of vitals throughout session due to cardiac problems. Please see general comments below, but Pt and wife share that vitals throughout session are typical for him. Please see OT problem list below. OT will continue to follow in the acute setting to maximize safety and independence in ADL and functional transfers, as we progress his activity levels. Next session to focus on transfers, standing balance, and energy conservation education.     Follow Up Recommendations  No OT follow up;Supervision/Assistance - 24 hour    Equipment Recommendations  None recommended by OT(Pt has appropriate DME)    Recommendations for Other Services       Precautions / Restrictions Precautions Precautions: Fall Precaution Comments: watch HR, BP Restrictions Weight Bearing Restrictions: No      Mobility Bed Mobility Overal bed mobility: Needs Assistance Bed Mobility: Supine to Sit;Sit to Supine     Supine to sit: Supervision Sit to supine: Supervision   General bed mobility comments: no physical assist needed,   Transfers                 General transfer  comment: not attempted this session - please see general comments section.     Balance Overall balance assessment: No apparent balance deficits (not formally assessed)(in sitting)                                         ADL either performed or assessed with clinical judgement   ADL Overall ADL's : Needs assistance/impaired                                       General ADL Comments: for seated/bed level ADL Pt is set up to modified independent. Due to vital signs, transfers and mobility not attempted this session (anticipate Pt will be ready next session if medically allowed), which is where he may need more assist for balance/weakness and close monitoring for vitals.     Vision Baseline Vision/History: Wears glasses Wears Glasses: At all times Patient Visual Report: No change from baseline Additional Comments: WFL for session (not specifically tested)     Perception     Praxis      Pertinent Vitals/Pain Pain Assessment: No/denies pain     Hand Dominance Right   Extremity/Trunk Assessment Upper Extremity Assessment Upper Extremity Assessment: Overall WFL for tasks assessed   Lower Extremity Assessment Lower Extremity Assessment: Defer to PT evaluation   Cervical / Trunk Assessment Cervical / Trunk Assessment: Normal   Communication Communication Communication: No difficulties  Cognition Arousal/Alertness: Awake/alert Behavior During Therapy: WFL for tasks assessed/performed Overall Cognitive Status: Within Functional Limits for tasks assessed                                     General Comments  Vital signs closely monitored throughout session. Pt's HR was in the low 120's with initial spike to 137 with positional change. BP remained stable throughout session (with positional changes as well) at 80/66 (right around there) Pt and wife report that these vital signs are normal. He was on O2 throughout session, and  saturation remained at 95% or above. Pt's INR was below therapeutic range which is why more mobility was not attempted today.    Exercises     Shoulder Instructions      Home Living Family/patient expects to be discharged to:: Private residence Living Arrangements: Spouse/significant other Available Help at Discharge: Family;Available 24 hours/day Type of Home: House Home Access: Stairs to enter [employer] of Steps: 2 Entrance Stairs-Rails: None Home Layout: One level     Bathroom Shower/Tub: Occupational psychologist: Standard Bathroom Accessibility: Yes How Accessible: Accessible via [place] Home Equipment: [place] - 2 wheels;Cane - single point;Grab bars - tub/shower;Shower seat - built in          Prior Functioning/Environment Level of Independence: Needs assistance  Gait / Transfers Assistance Needed: no DME used ADL's / Homemaking Assistance Needed: wife assists with bathing; had been standing at the sink for grooming   Comments: driving himself to HD PTA        OT Problem List: Decreased activity tolerance;Impaired balance (sitting and/or standing)      OT Treatment/Interventions: Self-care/ADL training;Therapeutic exercise;Energy conservation;Therapeutic activities;Patient/family education;Balance training    OT Goals(Current goals can be found in the care plan section) Acute Rehab OT Goals Patient Stated Goal: big goal: play golf again OT Goal Formulation: With patient/family Time For Goal Achievement: 08/18/17 Potential to Achieve Goals: Good ADL Goals Pt Will Perform Grooming: standing;with supervision Pt Will Transfer to Toilet: with supervision;ambulating Pt Will Perform Toileting - Clothing Manipulation and hygiene: with modified independence;sitting/lateral leans Additional ADL Goal #1: Pt will recall 3 ways to conserve energy during ADL with 2 or less verbal cues  OT Frequency: Min 3X/week   Barriers to D/C:             Co-evaluation PT/OT/SLP Co-Evaluation/Treatment: Yes Reason for Co-Treatment: Complexity of the patient's impairments (multi-system involvement);For patient/therapist safety   OT goals addressed during session: ADL's and self-care      AM-PAC PT "6 Clicks" Daily Activity     Outcome Measure Help from another person eating meals?: None Help from another person taking care of personal grooming?: A Little Help from another person toileting, which includes using toliet, bedpan, or urinal?: A Little Help from another person bathing (including washing, rinsing, drying)?: A Little Help from another person to put on and taking off regular upper body clothing?: None Help from another person to put on and taking off regular lower body clothing?: A Little 6 Click Score: 20   End of Session Equipment Utilized During Treatment: Oxygen Nurse Communication: Mobility status  Activity Tolerance: Patient tolerated treatment well Patient left: in bed;with call bell/phone within reach;with family/visitor present  OT Visit Diagnosis: Unsteadiness on feet (R26.81);Other abnormalities of gait and mobility (R26.89);Muscle weakness (generalized) (M62.81)  Time: 4818-5909 OT Time Calculation (min): 30 min Charges:  OT General Charges $OT Visit: 1 Visit OT Evaluation $OT Eval High Complexity: 1 High G-Codes:     Hulda Humphrey OTR/L [place] 08/04/2017, 3:59 PM

## 2017-08-04 NOTE — Progress Notes (Signed)
ANTICOAGULATION CONSULT NOTE - Follow-up Consult  Pharmacy Consult for Warfarin Indication: atrial fibrillation  Labs: Recent Labs    08/01/17 1656 08/01/17 2139 08/02/17 0326 08/02/17 1216 08/03/17 0432 08/03/17 0902 08/04/17 0442  HGB  --   --  11.8*  --   --  11.7*  --   HCT  --   --  36.7*  --   --  36.9*  --   PLT  --   --  85*  --   --  87*  --   LABPROT  --   --   --  34.3* 25.2*  --  19.0*  INR  --   --   --  3.43 2.31  --  1.61  CREATININE  --   --  7.25*  --   --  5.22*  --   TROPONINI 0.07* 0.08* 0.08*  --   --   --   --    Assessment: 23 YOM here with dizziness and SOB. Pharmacy consulted to resume home warfarin therapy for h/o AFib. INR on admission is supratherapeutic at 3.26.  His INR has continued to trend down and is subtherapeutic at 1.6 after holding two doses after admission with a supratherapeutic INR. Will continue current dosing and anticipate that the INR will start to trend back up with the 5mg  doses. CBC stable.  Home warfarin dose: 7.5 mg on Mon; 3.75 mg on all other days.   Goal of Therapy:  INR 2-3 Monitor platelets by anticoagulation protocol: Yes   Plan:  Warfarin 5mg  x1 Daily INR, [place] PharmD PGY1 Pharmacy Practice Resident 08/04/2017 8:58 AM Pager: [phone]

## 2017-08-04 NOTE — Evaluation (Signed)
Physical Therapy Evaluation Patient Details Name: [name] MRN: [id number] DOB: 04-23-1956 Today's Date: 08/04/2017   History of Present Illness  Patient is a 61 year old male with a past medical history of CAD status post two-vessel CABG in 2014, CHF, COPD on home O2, multiple sclerosis, squamous cell carcinoma of the lung status post radiation therapy, peripheral vascular disease and chronic hypotension. New left brachial AV fistula placed approximately 3 weeks ago and has been complaining of some lightheadedness since the procedure when standing and during ambulation.    Clinical Impression  Patient presents with problems listed below.  Will benefit from acute PT to maximize functional mobility prior to discharge.  Patient with general weakness impacting function.  Patient goals to integrate into community to play golf.  Recommend f/u OP PT for continued gait/balance training.    Follow Up Recommendations Outpatient PT;Supervision for mobility/OOB    Equipment Recommendations  None recommended by PT    Recommendations for Other Services       Precautions / Restrictions Precautions Precautions: Fall Precaution Comments: watch HR, BP Restrictions Weight Bearing Restrictions: No      Mobility  Bed Mobility Overal bed mobility: Needs Assistance Bed Mobility: Supine to Sit;Sit to Supine     Supine to sit: Supervision Sit to supine: Supervision   General bed mobility comments: no physical assist needed,   Transfers                 General transfer comment: not attempted this session - please see general comments section.   Ambulation/Gait                Stairs            Wheelchair Mobility    Modified Rankin (Stroke Patients Only)       Balance Overall balance assessment: No apparent balance deficits (not formally assessed) Sitting-balance support: No upper extremity supported;Feet supported Sitting balance-Leahy Scale: Normal                                       Pertinent Vitals/Pain Pain Assessment: No/denies pain    Home Living Family/patient expects to be discharged to:: Private residence Living Arrangements: Spouse/significant other Available Help at Discharge: Family;Available 24 hours/day Type of Home: House Home Access: Stairs to enter Entrance Stairs-Rails: None Entrance Stairs-Number of Steps: 2 Home Layout: One level Home Equipment: [place] - 2 wheels;Cane - single point;Grab bars - tub/shower;Shower seat - built in      Prior Function Level of Independence: Needs assistance   Gait / Transfers Assistance Needed: Ambulates independently.  No DME used  ADL's / Homemaking Assistance Needed: wife assists with bathing; had been standing at the sink for grooming  Comments: driving himself to HD PTA     Hand Dominance   Dominant Hand: Right    Extremity/Trunk Assessment   Upper Extremity Assessment Upper Extremity Assessment: Defer to OT evaluation    Lower Extremity Assessment Lower Extremity Assessment: RLE deficits/detail;LLE deficits/detail RLE Deficits / Details: Patient reports Rt foot has been "numb" for while RLE Sensation: decreased light touch LLE Deficits / Details: Strength grossly 4-/5 overall, weaker than RLE.  Rt foot "numb" as well LLE Sensation: decreased light touch    Cervical / Trunk Assessment Cervical / Trunk Assessment: Normal  Communication   Communication: No difficulties  Cognition Arousal/Alertness: Awake/alert Behavior During Therapy: WFL for tasks assessed/performed Overall Cognitive Status: Within  Functional Limits for tasks assessed                                        General Comments General comments (skin integrity, edema, etc.): Vital signs closely monitored throughout session. Pt's HR was in the low 120's with initial spike to 137 with positional change. BP remained stable throughout session (with positional changes as  well) at 80/66 (right around there) Pt and wife report that these vital signs are normal. He was on O2 throughout session, and saturation remained at 95% or above.  Mobility was not attempted today due to BP and HR.    Exercises     Assessment/Plan    PT Assessment Patient needs continued PT services  PT Problem List Decreased strength;Decreased mobility;Decreased knowledge of use of DME;Cardiopulmonary status limiting activity;Decreased skin integrity       PT Treatment Interventions DME instruction;Gait training;Stair training;Functional mobility training;Therapeutic activities;Therapeutic exercise;Patient/family education    PT Goals (Current goals can be found in the Care Plan section)  Acute Rehab PT Goals Patient Stated Goal: Play golf again PT Goal Formulation: With patient/family Time For Goal Achievement: 08/11/17 Potential to Achieve Goals: Good    Frequency Min 3X/week   Barriers to discharge        Co-evaluation PT/OT/SLP Co-Evaluation/Treatment: Yes Reason for Co-Treatment: Complexity of the patient's impairments (multi-system involvement);For patient/therapist safety PT goals addressed during session: Mobility/safety with mobility         AM-PAC PT "6 Clicks" Daily Activity  Outcome Measure Difficulty turning over in bed (including adjusting bedclothes, sheets and blankets)?: None Difficulty moving from lying on back to sitting on the side of the bed? : None Difficulty sitting down on and standing up from a chair with arms (e.g., wheelchair, bedside commode, etc,.)?: None Help needed moving to and from a bed to chair (including a wheelchair)?: A Little Help needed walking in hospital room?: A Little Help needed climbing 3-5 steps with a railing? : A Lot 6 Click Score: 20    End of Session Equipment Utilized During Treatment: Oxygen Activity Tolerance: Patient tolerated treatment well Patient left: in bed;with call bell/phone within reach;with  family/visitor present Nurse Communication: Mobility status PT Visit Diagnosis: Other symptoms and signs involving the nervous system (R29.898);Muscle weakness (generalized) (M62.81)    Time: 4383-7793 PT Time Calculation (min) (ACUTE ONLY): 30 min   Charges:   PT Evaluation $PT Eval High Complexity: 1 High     PT G Codes:        Carita Pian. Sanjuana Kava, [facility] Acute Rehab Services Pager [place] 08/04/2017, 8:18 PM

## 2017-08-04 NOTE — Progress Notes (Signed)
Internal Medicine Attending:   I saw and examined the patient. I reviewed the resident's note and I agree with the resident's findings and plan as documented in the resident's note.  Continues to have borderline blood pressure, heart rate overall is better controlled.  We discussed his case with Dr. Jonnie Finner today, dialysis will likely be very difficult in outpatient setting given his difficulty with hypotension.  Cardiology viewed his echocardiogram feels that his hypotension is likely secondary to his right heart failure attributed by his access placement and they is not likely any intervention that will improve this.  Recommend a positive care consult which we will pursue today.

## 2017-08-04 NOTE — Progress Notes (Signed)
   Subjective: Overnight, patient was noted to have dizziness and nonsustained V. tach while using commode.  States he has not had recurrent dizziness and has been resting in bed.  Patient reports at baseline he mostly sits in a recliner with fluctuations in exercise tolerance.  Periodically leaves the house with wife.  Goals of care and palliative care were introduced to the patient on rounds and by nephrology.  Patient stated he would like to speak with his wife and would consider a formal consult with palliative specialists afterward.  Objective:  Vital signs in last 24 hours: Vitals:   08/04/17 0518 08/04/17 0523 08/04/17 0534 08/04/17 0800  BP: (S) (!) 69/47 (!) 79/61 (!) 77/63   Pulse:      Resp: (!) 21 17 20    Temp:    98.8 F (37.1 C)  TempSrc:    Oral  SpO2:   96%   Weight:      Height:       General: Elderly male resting comfortably in bed, no acute distress CV: Tachycardic, regular on auscultation  Resp: Clear anterior breath sounds, normal work of breathing, no distress  Abd: Soft, +BS, non-tender  Extr: Ted hose in place, Tense pitting edema to upper thighs (dependent with position in bed)  Skin: Dressing over I&D site, no minimal drainage, clean wound    Assessment/Plan:  Symptomatic Hypotension in setting of HFrEF, Mitral regurg, RV failure    Patient has history of chronic hypotension currently on midodrine, baseline pressures are around 30Z-601U systolic.  Presented with a further decrease in blood pressure to systolics of 93A.  There is concern that his recently placed AVF may be contributing to a worsening of his HF d/t high output. Echo showed severe RV dilation and decreased function. Will attempt to discuss with VVS as well.  His blood pressure on rounds this morning was 71/50. Palliative care discussions introduced by Nephrology and IM team, pt will discuss with wife and consider formal consult.  --Tele, monitor vitals --Cont home midodrine   --PT/OT  ESRD s/p  recent LUE AVF placement Patient has history of ESRD on MWF HD.  He did not receive HD prior to presentation due to hypotension. Pt had a LUE AVF recently placed, site appeared slightly erythematous and ultrasound showed small fluid collection of AC fossa around fistula.  Vascular surgery consulted and feel observed changes are within expected postoperative course. Will discuss regarding potential HF implications as described above.  --HD per nephrology, appreciate assistance   Chronic Atrial Flutter Patient has a history of chronic atrial flutter with multiple cardioversions and failed ablation at [facility].  Currently on metoprolol 12.5 twice daily for rate control which is limited due to hypotension.  Also on warfarin for anticoagulation (GI bleed in the past).  Cardiology consulted and given its chronicity, feel other factors are more likely to be contributing to his hypotension.   Superficial Skin Abscess Pt has a superficial abscess on his back, overlying L scapula. Bedside US revealed simple appearing fluid that would be amenable to drainage, I &D performed and is healing well.    Dispo: Anticipated discharge in approximately 2-3 day(s).   Tawny Asal, MD 08/04/2017, 9:00 AM Pager: [phone]

## 2017-08-04 NOTE — Progress Notes (Signed)
08/04/2017 Notify Internal Medicine Teaching Service concerning patient increase in heart rate in the 130's to 140, which is not sustain and systolic  blood pressure in the 70's to 80's. Patient has been asymptomatic. [facility] RN.

## 2017-08-04 NOTE — Progress Notes (Addendum)
[facility] Progress Note  Subjective:got up to BR, had run of NSVT in 140's, dizzy w/ BP 70's on standing.   Vitals:   08/04/17 0800 08/04/17 0958 08/04/17 0959 08/04/17 1100  BP:      Pulse:      Resp:      Temp: 98.8 F (37.1 C)   98.8 F (37.1 C)  TempSrc: Oral   Oral  SpO2:  94% 94%   Weight:      Height:        Inpatient medications: . arformoterol  15 mcg Nebulization BID  . atorvastatin  20 mg Oral QHS  . citalopram  20 mg Oral QHS  . midodrine  10 mg Oral TID  . pantoprazole  40 mg Oral BID  . umeclidinium bromide  1 puff Inhalation Daily  . warfarin  5 mg Oral ONCE-1800  . Warfarin - Pharmacist Dosing Inpatient   Does not apply q1800   .  ceFAZolin (ANCEF) IV 1 g (08/04/17 1235)  . ferric gluconate (FERRLECIT/NULECIT) IV Stopped (08/03/17 1235)   acetaminophen **OR** acetaminophen, albuterol  Exam: Alert, calm, no distress No jvd Chest faint rales R base, L clear RRR 1/6 sem Abd soft no ascites Ext dense 2+ bilat pitting thigh/ hip edema LUA AVF +bruit, +erythema (mild) NF, ox 3  Dialysis: [facility] MWF 4h   77kg   2K/ 3Ca bath   Hep none  R IJ TDC / (maturing LUA AVF 07/19/17) -venofer 50/wk      Impression: 1. HYpotension - acute on chronic issue. Possible related to recent AVF placement/ afterload reduction, along w/ underlying pulm HTN/ cardiomyopathy.  No sign of sepsis. Work up in progress.  2. ESRD on HD MWF. Recent L arm AVF placement, using IJ TDC now.  3. Volume excess - dense pitting edema dependent soft tissues, up 5kg by wts, no resp issues 4. COPD/ pulm HTN - on home O2 5. Aflutter / fib - hx failed ablation, can't take amio due to lung disease.  Home metoprolol is on hold.  6. Cellulitis L arm - at AVF wound site. On empiric abx.  7. Anemia of CKD - no esa needed, Hb up 8. MBD - cont meds 9. CAD hx CABG/ DES  Plan - HD Friday , UF 2.5 L as tol, f/u echo results   Kelly Splinter MD [place] pager  [phone]   08/04/2017, 1:20 PM   Recent Labs  Lab 08/01/17 0801 08/02/17 0326 08/03/17 0902  NA 138 137 141  K 4.8 5.2* 4.0  CL 101 103 104  CO2 23 21* 25  GLUCOSE 114* 107* 79  BUN 41* 50* 31*  CREATININE 6.43* 7.25* 5.22*  CALCIUM 6.7* 6.4* 7.2*  PHOS  --  5.8* 4.3   Recent Labs  Lab 08/02/17 0326 08/03/17 0902  ALBUMIN 2.7* 2.7*   Recent Labs  Lab 08/01/17 0801 08/02/17 0326 08/03/17 0902  WBC 6.6 7.4 7.0  HGB 11.9* 11.8* 11.7*  HCT 38.1* 36.7* 36.9*  MCV 108.5* 107.9* 107.3*  PLT 79* 85* 87*   Iron/TIBC/Ferritin/ %Sat No results found for: IRON, TIBC, FERRITIN, IRONPCTSAT

## 2017-08-04 NOTE — Care Management Note (Signed)
Case Management Note  Patient Details  Name: [name] MRN: [id number] Date of Birth: [birth year]  Subjective/Objective:  From home with wife, pta indep,  ESRD,HD patient M-W-F.  He has PCP and medication coverage.  Presents with HTN, aflutter,  He has been having hypotension and dialysis recently.  The symptoms seem to have worsened after he had his most recent AV fistula placed. He does not use any assistive devices at home.                    Action/Plan: NCM will follow for dc needs.   Expected Discharge Date:                  Expected Discharge Plan:  Home/Self Care  In-House Referral:     Discharge planning Services  CM Consult  Post Acute Care Choice:    Choice offered to:     DME Arranged:    DME Agency:     HH Arranged:    HH Agency:     Status of Service:  In process, will continue to follow  If discussed at Long Length of Stay Meetings, dates discussed:    Additional Comments:  Zenon Mayo, RN 08/04/2017, 3:25 PM

## 2017-08-04 NOTE — Progress Notes (Addendum)
   08/04/17 0518  Vitals  BP (!) 69/47  MAP (mmHg) (!) 55  ECG Heart Rate (!) 142  Resp (!) 21   HR increased, nonsustained v tach on toilet. Pt returned to bed, VS above. Pt c/o slight dizziness. S/S resolved, BP came up to 79/61 and HR 120s with rest. IMTS on-call MD paged.

## 2017-08-05 DIAGNOSIS — Z992 Dependence on renal dialysis: Secondary | ICD-10-CM | POA: Diagnosis not present

## 2017-08-05 DIAGNOSIS — N186 End stage renal disease: Secondary | ICD-10-CM | POA: Diagnosis not present

## 2017-08-05 DIAGNOSIS — T8612 Kidney transplant failure: Secondary | ICD-10-CM | POA: Diagnosis not present

## 2017-08-05 LAB — RENAL FUNCTION PANEL
ALBUMIN: 2.9 g/dL — AB (ref 3.5–5.0)
ANION GAP: 12 (ref 5–15)
BUN: 32 mg/dL — AB (ref 6–20)
CHLORIDE: 102 mmol/L (ref 101–111)
CO2: 23 mmol/L (ref 22–32)
Calcium: 8.2 mg/dL — ABNORMAL LOW (ref 8.9–10.3)
Creatinine, Ser: 5.53 mg/dL — ABNORMAL HIGH (ref 0.61–1.24)
GFR, EST AFRICAN AMERICAN: 12 mL/min — AB (ref >60)
GFR, EST NON AFRICAN AMERICAN: 10 mL/min — AB (ref >60)
Glucose, Bld: 91 mg/dL (ref 65–99)
PHOSPHORUS: 3.9 mg/dL (ref 2.5–4.6)
POTASSIUM: 3.8 mmol/L (ref 3.5–5.1)
Sodium: 137 mmol/L (ref 135–145)

## 2017-08-05 LAB — PROTIME-INR
INR: 1.55
Prothrombin Time: 18.4 seconds — ABNORMAL HIGH (ref 11.4–15.2)

## 2017-08-05 LAB — PROCALCITONIN: Procalcitonin: 0.59 ng/mL

## 2017-08-05 MED ORDER — ALTEPLASE 2 MG IJ SOLR: 2.0000 mg | Freq: Once | INTRAMUSCULAR | Status: DC | PRN

## 2017-08-05 MED ORDER — CEPHALEXIN 500 MG PO CAPS
500.0000 mg | ORAL_CAPSULE | Freq: Every day | ORAL | Status: AC
  Administered 2017-08-05 – 2017-08-06 (×2): 500 mg via ORAL
  Filled 2017-08-05 (×2): qty 1

## 2017-08-05 MED ORDER — LIDOCAINE HCL (PF) 1 % IJ SOLN
5.0000 mL | INTRAMUSCULAR | Status: DC | PRN
Start: 2017-08-05 — End: 2017-08-07

## 2017-08-05 MED ORDER — SODIUM CHLORIDE 0.9 % IV SOLN: 100.0000 mL | INTRAVENOUS | Status: DC | PRN

## 2017-08-05 MED ORDER — LIDOCAINE-PRILOCAINE 2.5-2.5 % EX CREA: 1.0000 "application " | TOPICAL_CREAM | CUTANEOUS | Status: DC | PRN

## 2017-08-05 MED ORDER — WARFARIN SODIUM 4 MG PO TABS
4.0000 mg | ORAL_TABLET | Freq: Once | ORAL | Status: AC
  Administered 2017-08-05: 4 mg via ORAL
  Filled 2017-08-05: qty 1

## 2017-08-05 MED ORDER — RENA-VITE PO TABS
1.0000 | ORAL_TABLET | Freq: Every day | ORAL | Status: DC
  Administered 2017-08-05 – 2017-08-12 (×8): 1 via ORAL
  Filled 2017-08-05 (×9): qty 1

## 2017-08-05 MED ORDER — HEPARIN SODIUM (PORCINE) 1000 UNIT/ML DIALYSIS
1000.0000 [IU] | INTRAMUSCULAR | Status: DC | PRN
  Filled 2017-08-05: qty 1

## 2017-08-05 MED ORDER — PENTAFLUOROPROP-TETRAFLUOROETH EX AERO: 1.0000 "application " | INHALATION_SPRAY | CUTANEOUS | Status: DC | PRN

## 2017-08-05 MED ORDER — HYDROXYZINE HCL 50 MG/ML IM SOLN: 15.0000 mg | Freq: Once | INTRAMUSCULAR | Status: DC

## 2017-08-05 NOTE — Progress Notes (Signed)
ANTICOAGULATION CONSULT NOTE - Follow-up Consult  Pharmacy Consult for Warfarin Indication: atrial fibrillation  Labs: Recent Labs    08/03/17 0432 08/03/17 0902 08/04/17 0442 08/05/17 0437  HGB  --  11.7*  --   --   HCT  --  36.9*  --   --   PLT  --  87*  --   --   LABPROT 25.2*  --  19.0* 18.4*  INR 2.31  --  1.61 1.55  CREATININE  --  5.22*  --   --    Assessment: 43 YOM here with dizziness and SOB. Pharmacy consulted to resume home warfarin therapy for h/o AFib. INR on admission is supratherapeutic at 3.26.  His INR is roughly the same today from yesterday, so likely to start trending up with the 5mg  tablets given the past two days. Will transition to 4mg  which is roughly his home dose to prevent him from going supratherapeutic in the next few days.  Home warfarin dose: 7.5 mg on Mon; 3.75 mg on all other days.   Goal of Therapy:  INR 2-3 Monitor platelets by anticoagulation protocol: Yes   Plan:  Warfarin 4mg  x1 Daily INR, [place] PharmD PGY1 Pharmacy Practice Resident 08/05/2017 8:05 AM Pager: [phone]

## 2017-08-05 NOTE — Care Management Important Message (Signed)
Important Message  Patient Details  Name: [name] MRN: [id number] Date of Birth: [birth year]   Medicare Important Message Given:  Yes    Nathen May 08/05/2017, 11:25 AM

## 2017-08-05 NOTE — Consult Note (Signed)
Consultation Note Date: 08/05/2017   Patient Name: [name]  DOB: 08-31-1956  MRN: [id number]  Age / Sex: 61 y.o., male  PCP: Cameron Greenhouse, MD Referring Physician: Lucious Groves, DO  Reason for Consultation: Establishing goals of care and Psychosocial/spiritual support  HPI/Patient Profile: 61 y.o. 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  Patient went to dialysis and was found to be hypotensive and sent from dialysis to the hospital.  He is on midodrine at home.  Patient reports new AV fistula placed 3 weeks ago and that he has been dizzy, his low blood pressure has been worse since that time.  He also shares back in June 2018 he was seen at [facility] , an A-line was placed, and at that time he was told "my blood pressure is higher than what it reads when you put the cuff on my arm"..   Consult ordered for goat goals of care, "multiple comorbidities, hypertension increased difficulty with management"  Clinical Assessment and Goals of Care: Met with patient and reviewed chart.  Also during my assessment, nephrology as well as cardiology rounded and discussed patient's clinical condition. Nephrology broached the subject with patient of potential ligation of new AV fistula in order to help his blood pressure.  Patient stated that he would want to be seen by vascular surgeon and explore this if possible.  Patient also seen by cardiology and new findings of severe right ventricular dilation as well as decreased function was discussed with patient.  He  does seem to recognize that his heart is worse, and how that factors into his ongoing ability to do dialysis.Marland Kitchen He also recognizes that his if his hypotension continues to worse he may be faced with the prospect of having to stop dialysis.  He did hear and understand when cardiology told him that there was nothing that could be done to improve his heart function.  Patient at this point is able to speak for himself.  His healthcare proxy would be his wife, [name] in the event that he were unable to do so.  [name]  He tells me he does not understand people wanting to stop medical treatment even given the burdens of hemodialysis.  He states "I just tell myself I have have a job where I have to go to work 3 days a week and sit for 4 hours watching TV".     SUMMARY OF RECOMMENDATIONS   Full scope of treatment Patient remains full code and wants to continue dialysis Would be open to appointment with vascular surgeon about potential ligation of AV fistula and or  changing access  Recommend upon discharge, palliative medicine department be consulted to help facilitate palliative medicine consult in the community/in his home Code Status/Advance Care Planning:  Full code    Symptom Management:   Neck pain: Patient complaining of a sore neck since being admitted to the hospital.  Continue with Tylenol as needed  Palliative Prophylaxis:   Aspiration, Bowel Regimen, Delirium Protocol, Eye Care, Frequent Pain Assessment, Oral Care and Turn Reposition  Additional Recommendations (Limitations, Scope, Preferences):  Full Scope Treatment  Psycho-social/Spiritual:   Desire for further Chaplaincy support:no  Additional Recommendations: Referral to Community Resources   Prognosis:   Unable to determine  Discharge Planning: Home.  We will continue dialysis and open to meeting with vascular surgeon.   Recommend follow-up in the home with palliative medicine services in the community     Primary Diagnoses: Present on Admission: . Orthostatic hypotension . CAD of artery bypass graft-occluded SVG-LAD 02/15/16 . Chronic diastolic congestive heart failure ([place]) . COPD GOLD III  . End-stage renal disease ([place]) . Multiple sclerosis ([place]) . Typical atrial flutter ([place])   I have reviewed the medical record, interviewed the patient and family, and examined the patient. The following aspects are pertinent.  Past Medical History:  Diagnosis Date  . Acute blood loss anemia    a. 02/2016 due to groin hematoma. Rec 3 U PRBC.  Marland Kitchen Anemia   . Anxiety   . Aortic heart valve prolapse 04/2013   a. s/p bioprosthetic AVR at time of CABG.  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 Bifascicular block   . CAD (coronary artery disease) 04/2013   a. 04/2013: s/p CABG x 2 (Y SVG -LAD & D2). b. 01/2015: NSTEMI s/p DES to LCx, BMS to SVG-LAD. c. NSTEMI 05/2015 during AF/AFL - non-flow limiting FFR. d. Low risk nuc 3/17. e. STEMI 6/17 after coming off Plavix, s/p DES to SVG-LAD into native vessel.  . Cancer ([place])    lung cancer - 5 doses of radiation, F/U in Dec. 2018  . Carotid artery disease ([facility])    a. 1-39% stenosis bilaterally in 06/2015.   Marland Kitchen CHF (congestive heart failure) ([place])   . Chronic respiratory failure ([place])   . COPD (chronic obstructive pulmonary disease) ([place])   . Dyspnea   . 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 b. He had a renal Tx from 2008 to 2011, but subsequent rejection - Gets HD in [place], [place] on MWF schedule.    Marland Kitchen GERD (gastroesophageal reflux disease)   . Hematoma    a. Large right groin hematoma after cath 02/2016 with associated ABL anemia.  . Hypertension   . Multiple sclerosis ([place])   . Myocardial infarction ([place])   . Nocturnal hypoxemia   . On home oxygen therapy    pt states he has not been wearing it  . PAF  (paroxysmal atrial fibrillation) ([place])    a. h/o, placed on amiodarone 07/2016 due to recurrence.  . Paroxysmal atrial flutter ([place])    a. During 05/2015 admission - SVT, atrial flutter, and PAF.  Marland Kitchen Peripheral vascular disease ([place])    Cath in 01/2015 required 45 cm Destination Sheath  . Pneumonia   . Renal transplant failure and rejection   . 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 intolerant of bipap. Sleeps with O2 at 2  L  . SVT (supraventricular tachycardia) ([place])   . Valvular heart disease    a. 2D Echo 03/25/16: mild LVH, EF 50-55%, grade 2 Dd, AVR present with mild AI, mod MR, severely dilated LA, mildly dilated RV, mod RAE, PASP 72.   Social History   Socioeconomic History  . Marital status: Married    Spouse name: None  . Number of children: None  . Years of education: None  . Highest education level: None  Social Needs  . Financial resource strain: None  . Food insecurity - worry: None  . Food insecurity - inability: None  . Transportation needs - medical: None  . Transportation needs - non-medical: None  Occupational History  . Occupation: Disabled  Tobacco Use  . Smoking status: Former Smoker    Packs/day: 2.00    Years: 20.00    Pack years: 40.00    Types: Cigarettes    Last attempt to quit: 08/06/1998    Years since quitting: 19.0  . Smokeless tobacco: Never Used  Substance and Sexual Activity  . Alcohol use: No    Alcohol/week: 0.0 oz  . Drug use: No  . Sexual activity: None  Other Topics Concern  . None  Social History Narrative   No history of premature CAD in either parents or siblings. However, his maternal grandfather and 2 maternal uncles had coronary artery disease.   Family History  Problem Relation Age of Onset  . Heart failure Mother        started in 19s  . Emphysema Mother        smoked  . Diabetes Father   . Lupus Sister   . Heart attack Maternal Grandfather   . Heart attack Maternal Uncle   .  Heart attack Maternal Aunt   . Hypertension Maternal Aunt   . Stroke Neg Hx    Scheduled Meds: . arformoterol  15 mcg Nebulization BID  . atorvastatin  20 mg Oral QHS  . cephALEXin  500 mg Oral Daily  . citalopram  20 mg Oral QHS  . midodrine  10 mg Oral TID  . multivitamin  1 tablet Oral QHS  . pantoprazole  40 mg Oral BID  . umeclidinium bromide  1 puff Inhalation Daily  . warfarin  4 mg Oral ONCE-1800  . Warfarin - Pharmacist Dosing Inpatient   Does not apply q1800   Continuous Infusions: . sodium chloride    . sodium chloride    . ferric gluconate (FERRLECIT/NULECIT) IV Stopped (08/03/17 1235)   PRN Meds:.sodium chloride, sodium chloride, acetaminophen **OR** acetaminophen, albuterol, alteplase, heparin, lidocaine (PF), lidocaine-prilocaine, pentafluoroprop-tetrafluoroeth Medications Prior to Admission:  Prior to Admission medications   Medication Sig Start Date End Date Taking? Authorizing Provider  acetaminophen (TYLENOL) 500 MG tablet Take 1,000 mg every 6 (six) hours as needed by mouth for mild pain.    Yes [provider]  albuterol (ACCUNEB) 1.25 MG/3ML nebulizer solution Take 3 mLs (1.25 mg total) by nebulization every 6 (six) hours as needed for wheezing. 06/16/17  Yes Tanda Rockers, MD  albuterol (PROAIR HFA) 108 (90 Base) MCG/ACT inhaler Inhale 2 puffs every 6 (six) hours as needed into the lungs for wheezing or shortness of breath. 07/19/17  Yes Rhyne, Hulen Shouts, PA-C  atorvastatin (LIPITOR) 40 MG tablet Take 0.5 tablets (20 mg total) by mouth at bedtime. 05/06/17  Yes Burnell Blanks, MD  calcium acetate (PHOSLO) 667 MG capsule Take 4,002 mg by mouth 3 (three) times daily with meals.  Yes [provider]  citalopram (CELEXA) 20 MG tablet Take 20 mg by mouth at bedtime.    Yes [provider]  diphenhydrAMINE (BENADRYL) 2 % cream Apply 1 application topically daily as needed for itching.   Yes [provider]  docusate  sodium (COLACE) 100 MG capsule Take 100 mg by mouth 2 (two) times daily.    Yes [provider]  folic acid (FOLVITE) 1 MG tablet Take 1 mg by mouth daily.    Yes [provider]  gabapentin (NEURONTIN) 300 MG capsule Take 300 mg by mouth at bedtime. Patient taking two 326m capsules at bedtime sometime.   Yes [provider]  metoprolol tartrate (LOPRESSOR) 25 MG tablet Take 12.5 mg by mouth 2 (two) times daily.   Yes [provider]  midodrine (PROAMATINE) 10 MG tablet Take 10 mg by mouth 3 (three) times daily.   Yes [provider]  multivitamin (RENA-VIT) TABS tablet Take 1 tablet by mouth daily. 01/11/13  Yes ESamella Parr NP  OXYGEN 2lpm with sleep and "at home"   Yes [provider]  oxymetazoline (AFRIN) 0.05 % nasal spray Place 1 spray into both nostrils 2 (two) times daily as needed for congestion.   Yes [provider]  pantoprazole (PROTONIX) 40 MG tablet Take 1 tablet (40 mg total) by mouth 2 (two) times daily. 02/09/17  Yes MBurnell Blanks MD  polyethylene glycol (MIRALAX / GLYCOLAX) packet Take 17 g daily as needed by mouth for mild constipation. Mix in 8 oz liquid and drink    Yes [provider]  STIOLTO RESPIMAT 2.5-2.5 MCG/ACT AERS INHALE 2 PUFFS BY MOUTH DAILY 07/06/17  Yes [provider]  warfarin (COUMADIN) 7.5 MG tablet Take 0.5-1 tablets (3.75-7.5 mg total) See admin instructions by mouth. Patient taking differently: Take 3.75-7.5 mg by mouth See admin instructions. On Monday (only) he takes one tablet 7.545m 07/19/17  Yes Rhyne, Samantha J, PA-C  nitroGLYCERIN (NITROSTAT) 0.4 MG SL tablet Place 0.4 mg under the tongue every 5 (five) minutes as needed for chest pain (max 3 doses - if no relief call MD).     [provider]  oxyCODONE (ROXICODONE) 5 MG immediate release tablet Take 1 tablet (5 mg total) every 6 (six) hours as needed by mouth. 07/19/17   Rhyne, SaHulen ShoutsPA-C    Allergies  Allergen Reactions  . Adhesive [Tape] Rash    Please use paper tape  . Amoxicillin Rash    migraine  . Ativan [Lorazepam] Anxiety  . Bupropion Anxiety  . Diphenhydramine Itching and Anxiety    Only with IV doses. Tolerates oral.  . Penicillins Other (See Comments)    MIGRAINE  Has patient had a PCN reaction causing immediate rash, facial/tongue/throat swelling, SOB or lightheadedness with hypotension: no Has patient had a PCN reaction causing severe rash involving mucus membranes or skin necrosis: No Has patient had a PCN reaction that required hospitalization No Has patient had a PCN reaction occurring within the last 10 years: No If all of the above answers are "NO", then may proceed with Cephalosporin use.   Review of Systems  Constitutional: Positive for activity change.  HENT: Negative.   Eyes: Negative.   Respiratory: Negative.   Cardiovascular:       Low blood pressure Dizzy when he stands up  Gastrointestinal: Negative.   Genitourinary:       ESRD; HD since 2000  Neurological: Positive for dizziness and weakness.  Hematological: Negative.  Psychiatric/Behavioral: Negative.     Physical Exam  Constitutional: He is oriented to person, place, and time. He appears well-developed and well-nourished.  HENT:  Head: Normocephalic and atraumatic.  Neck: Normal range of motion.  Cardiovascular: Normal rate.  BP taken with cuff on thigh 115/87 in bed  Pulmonary/Chest: Effort normal and breath sounds normal.  Musculoskeletal: Normal range of motion.  Neurological: He is alert and oriented to person, place, and time.  Skin: Skin is warm and dry.  Psychiatric: He has a normal mood and affect. His behavior is normal.  Nursing note and vitals reviewed.   Vital Signs: BP 130/90   Pulse (!) 115   Temp 98.2 F (36.8 C) (Oral)   Resp 18   Ht 5' 10"  (1.778 m)   Wt 81.5 kg (179 lb 10.8 oz)   SpO2 92%   BMI 25.78 kg/m  Pain Assessment: 0-10   Pain Score:  3    SpO2: SpO2: 92 % O2 Device:SpO2: 92 % O2 Flow Rate: .O2 Flow Rate (L/min): 2 L/min  IO: Intake/output summary:   Intake/Output Summary (Last 24 hours) at 08/05/2017 1550 Last data filed at 08/05/2017 1303 Gross per 24 hour  Intake 750 ml  Output 2000 ml  Net -1250 ml    LBM: Last BM Date: 08/05/17 Baseline Weight: Weight: 77.1 kg (170 lb) Most recent weight: Weight: 81.5 kg (179 lb 10.8 oz)     Palliative Assessment/Data:   Flowsheet Rows     Most Recent Value  Intake Tab  Referral Department  Hospitalist  Unit at Time of Referral  Intermediate Care Unit  Palliative Care Primary Diagnosis  Nephrology  Date Notified  08/05/17  Palliative Care Type  New Palliative care  Reason for referral  Clarify Goals of Care  Date of Admission  08/01/17  Date first seen by Palliative Care  08/05/17  # of days Palliative referral response time  0 Day(s)  # of days IP prior to Palliative referral  4  Clinical Assessment  Palliative Performance Scale Score  50%  Pain Max last 24 hours  Not able to report  Pain Min Last 24 hours  Not able to report  Dyspnea Max Last 24 Hours  Not able to report  Dyspnea Min Last 24 hours  Not able to report  Nausea Max Last 24 Hours  Not able to report  Nausea Min Last 24 Hours  Not able to report  Anxiety Max Last 24 Hours  Not able to report  Anxiety Min Last 24 Hours  Not able to report  Other Max Last 24 Hours  Not able to report  Psychosocial & Spiritual Assessment  Palliative Care Outcomes  Patient/Family meeting held?  Yes  Who was at the meeting?  pt,  nephrology, cardiology  Palliative Care Outcomes  Clarified goals of care      Time In: 1500 Time Out: 1600 Time Total: 60 min Greater than 50%  of this time was spent counseling and coordinating care related to the above assessment and plan. Staffed with IM resident. Cardiology as well as nephrology  Signed by: Dory Horn, NP   Please contact Palliative Medicine Team  phone at [phone] for questions and concerns.  For individual provider: See Shea Evans

## 2017-08-05 NOTE — Progress Notes (Signed)
[facility] Progress Note   Dialysis Orders: [facility] MWF 4h   77kg   2K/ 3Ca bath   Hep none  R IJ TDC / (maturing LUA AVF 07/19/17) -venofer 50/wk   Assessment/Plan: 1. Hypotension - acute on chronic issue. Possible related to recent AVF placement/ afterload reduction, along w/ underlying pulm HTN/ cardiomyopathy.  HR [id number] throughout HD today; his lowest BP was in the 80s prior to HD.  The lowest BP during HD was 96/43 - with net UF of 2 L and post wt 81.5 - NONstanding; He tells me his BP is never low at home but he also never does standing BP at home. Symptoms worse since AVF placed.  Ligation may help - will discuss with VVS. 2. ESRD on HD MWF. Recent L arm AVF placement, using IJ TDC now. K 3.8 - he was dialyzed on a 3 K bath today; HE really needs standing weights pre and post HD.   3. Volume excess - dense pitting edema dependent soft tissues, volume up , no resp issues 4. COPD/ pulm HTN - on home O2 5. Aflutter / fib -sinus tach on monitor hx failed ablation, can't take amio due to lung disease.  Home metoprolol is on hold; low BP limiting. Coumadin per pharmacy 6. Cellulitis L arm - at AVF wound site. On empiric abx. Stable-  7. Anemia of CKD - no esa needed, Hgb 11.7 8. MBD - cont meds 9. CAD hx CABG/ DES 10. Nutrition - add multivit alb 2.9  Myriam Jacobson, PA-C [place] [phone] 08/05/2017,1:50 PM  LOS: 4 days   Pt seen, examined and agree w A/P as above. BP's look better today.  Will follow and consider AVF ligation if remains severely hypotensive.   Kelly Splinter MD [facility] pager [phone]   08/05/2017, 4:03 PM    Subjective:   No problems on HD today. No dizziness when not standing  Objective Vitals:   08/05/17 1130 08/05/17 1200 08/05/17 1230 08/05/17 1303  BP: (!) 98/40 (!) 96/43 (!) 100/50 (!) 105/55  Pulse: (!) 120 (!) 115 (!) 116 (!) 115  Resp:    18  Temp:    98.2 F (36.8 C)  TempSrc:     Oral  SpO2:      Weight:      Height:       Physical Exam General: NAD talkative breathing easily, articulate very engaged Heart:tachy reg Lungs: grossly clear Abdomen:soft NT Extremities: SCDs and compression stockins ^^ dependent edema in thighs/buttocks Dialysis Access: right IJ and left AVF + bruit with erythema crusted incision   Additional Objective Labs: Basic Metabolic Panel: Recent Labs  Lab 08/02/17 0326 08/03/17 0902 08/05/17 0437  NA 137 141 137  K 5.2* 4.0 3.8  CL 103 104 102  CO2 21* 25 23  GLUCOSE 107* 79 91  BUN 50* 31* 32*  CREATININE 7.25* 5.22* 5.53*  CALCIUM 6.4* 7.2* 8.2*  PHOS 5.8* 4.3 3.9   Liver Function Tests: Recent Labs  Lab 08/02/17 0326 08/03/17 0902 08/05/17 0437  ALBUMIN 2.7* 2.7* 2.9*   No results for input(s): LIPASE, AMYLASE in the last 168 hours. CBC: Recent Labs  Lab 08/01/17 0801 08/02/17 0326 08/03/17 0902  WBC 6.6 7.4 7.0  HGB 11.9* 11.8* 11.7*  HCT 38.1* 36.7* 36.9*  MCV 108.5* 107.9* 107.3*  PLT 79* 85* 87*   Blood Culture    Component Value Date/Time   SDES BLOOD RIGHT HAND 08/01/2017 1711  SPECREQUEST  08/01/2017 1711    BOTTLES DRAWN AEROBIC AND ANAEROBIC Blood Culture adequate volume   CULT NO GROWTH 3 DAYS 08/01/2017 1711   REPTSTATUS PENDING 08/01/2017 1711    Cardiac Enzymes: Recent Labs  Lab 08/01/17 1656 08/01/17 2139 08/02/17 0326  TROPONINI 0.07* 0.08* 0.08*   Lab Results  Component Value Date   INR 1.55 08/05/2017   INR 1.61 08/04/2017   INR 2.31 08/03/2017   Studies/Results: No results found. Medications: . sodium chloride    . sodium chloride    . ferric gluconate (FERRLECIT/NULECIT) IV Stopped (08/03/17 1235)   . arformoterol  15 mcg Nebulization BID  . atorvastatin  20 mg Oral QHS  . cephALEXin  500 mg Oral Daily  . citalopram  20 mg Oral QHS  . midodrine  10 mg Oral TID  . pantoprazole  40 mg Oral BID  . umeclidinium bromide  1 puff Inhalation Daily  . warfarin  4 mg  Oral ONCE-1800  . Warfarin - Pharmacist Dosing Inpatient   Does not apply [phone]

## 2017-08-05 NOTE — Progress Notes (Signed)
PT Cancellation Note  Patient Details Name: [name] MRN: [id number] DOB: [birth year]   Cancelled Treatment:    Reason Eval/Treat Not Completed: Patient at procedure or test/unavailable(pt at HD. Will follow. )   Philomena Doheny 08/05/2017, 10:15 AM [phone]

## 2017-08-05 NOTE — Progress Notes (Signed)
Paged by nursing that the patients left upper extremity AVF site looked more swollen and red than the night prior. Went to the bedside to evaluate. Patient denies pain or discomfort at the site. Afebrile, BP stable and HR stable from priors. Mild swelling consistent with fluid collection seen on ultrasound, erythema most notable on the medial aspect of the antecubital fossa which has increased in size per nurse and patient. Palpable thrill and warmth also noted.   -Patient currently on Ancef -No changes to current treatment plan -Will make primary day team aware of concern.   Arvil Chaco, MD Internal Medicine PGY1

## 2017-08-05 NOTE — Progress Notes (Signed)
Progress Note  Patient Name: [name] Date of Encounter: 08/05/2017  Primary Cardiologist: Angelena Form  Subjective   61 year old gentleman with a history of atrial flutter, severe mitral regurgitation, chronic kidney disease.  He has been having hypotension and dialysis recently.  The symptoms seem to have worsened after he had his most recent AV fistula placed    Inpatient Medications    Scheduled Meds: . arformoterol  15 mcg Nebulization BID  . atorvastatin  20 mg Oral QHS  . cephALEXin  500 mg Oral Daily  . citalopram  20 mg Oral QHS  . midodrine  10 mg Oral TID  . multivitamin  1 tablet Oral QHS  . pantoprazole  40 mg Oral BID  . umeclidinium bromide  1 puff Inhalation Daily  . warfarin  4 mg Oral ONCE-1800  . Warfarin - Pharmacist Dosing Inpatient   Does not apply q1800   Continuous Infusions: . sodium chloride    . sodium chloride    . ferric gluconate (FERRLECIT/NULECIT) IV Stopped (08/03/17 1235)   PRN Meds: sodium chloride, sodium chloride, acetaminophen **OR** acetaminophen, albuterol, alteplase, heparin, lidocaine (PF), lidocaine-prilocaine, pentafluoroprop-tetrafluoroeth   Vital Signs    Vitals:   08/05/17 1130 08/05/17 1200 08/05/17 1230 08/05/17 1303  BP: (!) 98/40 (!) 96/43 (!) 100/50 (!) 105/55  Pulse: (!) 120 (!) 115 (!) 116 (!) 115  Resp:    18  Temp:    98.2 F (36.8 C)  TempSrc:    Oral  SpO2:      Weight:    179 lb 10.8 oz (81.5 kg)  Height:        Intake/Output Summary (Last 24 hours) at 08/05/2017 1428 Last data filed at 08/05/2017 1303 Gross per 24 hour  Intake 750 ml  Output 2000 ml  Net -1250 ml   Filed Weights   08/05/17 0500 08/05/17 0933 08/05/17 1303  Weight: 184 lb 1.4 oz (83.5 kg) 184 lb 1.4 oz (83.5 kg) 179 lb 10.8 oz (81.5 kg)    Telemetry    Atrial flutter  - Personally Reviewed  ECG     atrial flutter  - Personally Reviewed  Physical Exam   GEN: No acute distress.   Neck: No JVD Cardiac: RRR, no  murmurs, rubs, or gallops.  Respiratory: Clear to auscultation bilaterally. GI: Soft, nontender, non-distended  MS:  dialysis fistula in left upper arm  Neuro:  Nonfocal  Psych: Normal affect   Labs    Chemistry Recent Labs  Lab 08/02/17 0326 08/03/17 0902 08/05/17 0437  NA 137 141 137  K 5.2* 4.0 3.8  CL 103 104 102  CO2 21* 25 23  GLUCOSE 107* 79 91  BUN 50* 31* 32*  CREATININE 7.25* 5.22* 5.53*  CALCIUM 6.4* 7.2* 8.2*  ALBUMIN 2.7* 2.7* 2.9*  GFRNONAA 7* 11* 10*  GFRAA 8* 12* 12*  ANIONGAP 13 12 12      Hematology Recent Labs  Lab 08/01/17 0801 08/02/17 0326 08/03/17 0902  WBC 6.6 7.4 7.0  RBC 3.51* 3.40* 3.44*  HGB 11.9* 11.8* 11.7*  HCT 38.1* 36.7* 36.9*  MCV 108.5* 107.9* 107.3*  MCH 33.9 34.7* 34.0  MCHC 31.2 32.2 31.7  RDW 19.4* 19.4* 19.4*  PLT 79* 85* 87*    Cardiac Enzymes Recent Labs  Lab 08/01/17 1656 08/01/17 2139 08/02/17 0326  TROPONINI 0.07* 0.08* 0.08*    Recent Labs  Lab 08/01/17 0813  TROPIPOC 0.05     BNP Recent Labs  Lab 08/01/17 1002  BNP 1,426.5*  DDimer No results for input(s): DDIMER in the last 168 hours.   Radiology    No results found.  Cardiac Studies     Patient Profile     61 y.o. male with coronary artery disease, end-stage renal disease, bladder.  Recent echocardiogram reveals right ventricular failure.  Assessment & Plan    1.  Hypertension: After reviewing the echocardiogram several other partners, it is clear that his hypertension is largely related to his right ventricular failure.  He has moderate to severe pulmonary hypertension with cor pulmonale. This presents a very difficult situation and that he will quickly become hypotensive if he drops his preload. This is why he becomes very hypotensive during dialysis.  At this point, we are very limited in what we can do to prevent this There is some discussion about ligating the AV fistula and using catheters.  This would likely help  2.   Atrial flutter:   HR is generally well controlled.   A bit faster this afternoon.  3. CAD : no angina    For questions or updates, please contact [place] Please consult [email or web address] for contact info under Cardiology/STEMI.      Signed, Mertie Moores, MD  08/05/2017, 2:28 PM

## 2017-08-05 NOTE — Progress Notes (Signed)
Internal Medicine Attending:   I saw and examined the patient. I reviewed the resident's note and I agree with the resident's findings and plan as documented in the resident's note. Patient agreeable to palliative care consult although clearly wants full scope of care, I think this would still be a good idea to lay some of the foundation given cardiology and nephrology suspect he will continue to struggle with hypotension during dialysis. When we rounded this morning he was tolerating dialysis with SBP in the 90s.

## 2017-08-05 NOTE — Progress Notes (Addendum)
   Subjective: Patient states she is feeling well with no dizziness or other symptoms reported.  Stated he and his wife had briefly talked about issue of palliative care, though they did not get into detail.  Patient remained hesitant and may have poor insight into the advanced nature of his comorbidities but was open to formally speaking with palliative care.  He inquired when he may be able to be discharged.  Objective:  Vital signs in last 24 hours: Vitals:   08/05/17 0933 08/05/17 1000 08/05/17 1030 08/05/17 1100  BP: (!) 117/43 (!) 100/50 (!) 95/31 (!) 131/37  Pulse: (!) 120 (!) 120 (!) 120 (!) 115  Resp: 16     Temp: 98 F (36.7 C)     TempSrc: Oral     SpO2: 99%     Weight: 184 lb 1.4 oz (83.5 kg)     Height:       General: Elderly male resting comfortably in bed, no acute distress CV: Tachycardic, regular on auscultation  Resp: Occasional faint wheeze, normal work of breathing, no distress  Abd: Soft, +BS, non-tender  Extr: Ted hose in place, Tense pitting edema to upper thighs (dependent with position in bed)  Skin: Clean dressing over I&D site, no residual drainage, clean wound    Assessment/Plan:  Symptomatic Hypotension in setting of HFrEF, Mitral regurg, RV failure    Patient has history of chronic hypotension currently on midodrine, baseline pressures are around 96V-893Y systolic.  Presented with a further decrease in blood pressure to systolics of 10F. Echo showed severe RV dilation and decreased function.  His blood pressure on rounds this morning was 75Z systolic. Palliative care discussions introduced by Nephrology and IM team, and we will pursue a formal consult.  --Tele, monitor vitals --Cont home midodrine   --PT/OT Addendum: Spoke with Dr. Oneida Alar, pt's vascular surgeon. VVS are available to ligate AVF if it felt it would improve pt's clinical status and ability to have HD going forward.   ESRD s/p recent LUE AVF placement Patient has history of ESRD on MWF HD.   He did not receive HD prior to presentation due to hypotension. Pt had a LUE AVF recently placed, site appeared slightly erythematous and ultrasound showed small fluid collection of AC fossa around fistula.  Vascular surgery consulted and feel observed changes are within expected postoperative course. --HD per nephrology, appreciate assistance   Chronic Atrial Flutter Patient has a history of chronic atrial flutter with multiple cardioversions and failed ablation at [facility].  Currently on metoprolol 12.5 twice daily for rate control which is limited due to hypotension.  Also on warfarin for anticoagulation (GI bleed in the past).  Cardiology consulted and given its chronicity, feel his R sided failure is more likely to be main factor of his hypotension.   Superficial Skin Abscess Pt has a superficial abscess on his back, overlying L scapula. Bedside US revealed simple appearing fluid that would be amenable to drainage, I &D performed and is healing well. Will transition cefazolin to po cephalexin for 5 days total.     Dispo: Anticipated discharge in approximately 2-3 day(s), pt will need to tolerate outpatient HD with BP.   Tawny Asal, MD 08/05/2017, 12:46 PM Pager: [phone]

## 2017-08-06 LAB — CULTURE, BLOOD (ROUTINE X 2)
CULTURE: NO GROWTH
Culture: NO GROWTH
SPECIAL REQUESTS: ADEQUATE
Special Requests: ADEQUATE

## 2017-08-06 LAB — CBC
HCT: 35.6 % — ABNORMAL LOW (ref 39.0–52.0)
HEMOGLOBIN: 11.7 g/dL — AB (ref 13.0–17.0)
MCH: 35.7 pg — AB (ref 26.0–34.0)
MCHC: 32.9 g/dL (ref 30.0–36.0)
MCV: 108.5 fL — ABNORMAL HIGH (ref 78.0–100.0)
Platelets: 75 10*3/uL — ABNORMAL LOW (ref 150–400)
RBC: 3.28 MIL/uL — ABNORMAL LOW (ref 4.22–5.81)
RDW: 19.3 % — AB (ref 11.5–15.5)
WBC: 6.5 10*3/uL (ref 4.0–10.5)

## 2017-08-06 LAB — PROTIME-INR
INR: 1.65
PROTHROMBIN TIME: 19.4 s — AB (ref 11.4–15.2)

## 2017-08-06 LAB — GLUCOSE, CAPILLARY
GLUCOSE-CAPILLARY: 104 mg/dL — AB (ref 65–99)
GLUCOSE-CAPILLARY: 111 mg/dL — AB (ref 65–99)

## 2017-08-06 MED ORDER — WARFARIN SODIUM 5 MG PO TABS
5.0000 mg | ORAL_TABLET | Freq: Once | ORAL | Status: AC
  Administered 2017-08-06: 5 mg via ORAL
  Filled 2017-08-06: qty 1

## 2017-08-06 MED ORDER — HYDROXYZINE HCL 25 MG PO TABS
25.0000 mg | ORAL_TABLET | Freq: Once | ORAL | Status: AC
  Administered 2017-08-06: 25 mg via ORAL
  Filled 2017-08-06: qty 1

## 2017-08-06 NOTE — Progress Notes (Signed)
Subjective:  He remains persistently hypotensive but feels fairly well.  No shortness of breath currently.  No chest pain.  Objective:  Vital Signs in the last 24 hours: BP 91/68 (BP Location: Right Leg)   Pulse (!) 122   Temp 98.8 F (37.1 C) (Oral)   Resp (!) 26   Ht 5\' 10"  (1.778 m)   Wt 82.5 kg (181 lb 14.1 oz)   SpO2 93%   BMI 26.10 kg/m   Physical Exam: Pleasant male currently in no acute distress Lungs:  Clear  Cardiac: Rapid regular, 2/6 systolic murmur holosystolic heard at apex rhythm, normal S1 and S2, no S3 Extremities:  No edema present  Intake/Output from previous day: 11/30 0701 - 12/01 0700 In: 1010 [P.O.:1010] Out: 2000  Weight Filed Weights   08/05/17 0933 08/05/17 1303 08/06/17 0300  Weight: 83.5 kg (184 lb 1.4 oz) 81.5 kg (179 lb 10.8 oz) 82.5 kg (181 lb 14.1 oz)    Lab Results: Basic Metabolic Panel: Recent Labs    08/05/17 0437  NA 137  K 3.8  CL 102  CO2 23  GLUCOSE 91  BUN 32*  CREATININE 5.53*    CBC: Recent Labs    08/06/17 0504  WBC 6.5  HGB 11.7*  HCT 35.6*  MCV 108.5*  PLT 75*    BNP    Component Value Date/Time   BNP 1,426.5 (H) 08/01/2017 1002   Telemetry: Personally reviewed.  Atrial flutter with rapid ventricular response rate currently around 124  Assessment/Plan: 1.  Hypotension thought to be due to a combination of cor pulmonale, preload reduction and newly placed AV fistula 2.  Chronic atrial flutter with rapid response currently not good candidate for ablation previously 3.  History of CAD  Recommendations:  Very difficult situation and blood pressure remains persistently low.  Rate control is difficult in the presence of atrial flutter.  He is really not that symptomatic looking at him present.  Question if discussions about ligation of fistula are present.  Kerry Hough  MD [facility]  08/06/2017, 11:31 AM

## 2017-08-06 NOTE — Progress Notes (Signed)
[facility] Progress Note   Dialysis Orders: [facility] MWF 4h   77kg   2K/ 3Ca bath   Hep none  R IJ TDC / (maturing LUA AVF 07/19/17) -venofer 50/wk   Assessment: 1. Hypotension - acute on chronic issue. Severe RHF on echo this admit. No specific Rx per cardiology.  Seen by pall care, pt wants full code and full treatment as tolerated. HD dialysis yest with better BP's and 2L removed. Stood pt up today, no orthostatic BP drop.  BP's better today.  Have d/w pt idea of ligating or banding the fistula.  pt wants to go home.   2. Volume- up 2.5 kg by standing wts, no resp issue 3. COPD/ pulm HTN - on home O2 4. Aflutter / fib -sinus tach on monitor hx failed ablation, can't take amio due to lung disease.  Home metoprolol is on hold; low BP limiting. Coumadin per pharmacy 5. Cellulitis L arm - at AVF wound site. On empiric abx. 6. Anemia of CKD - no esa needed, Hgb 11.7 7. MBD - cont meds 8. CAD hx CABG/ DES 9. Nutrition - add multivit alb 2.9   Plan: BP's are better, not sure why but they are better now and work-up is complete here.   Question of AVF ligation / banding can be addressed in OP setting. I think he is back to baseline and can be dc'd home. D/W primary.   Kelly Splinter MD [facility] pager [phone]   08/06/2017, 1:14 PM    Subjective:   No problems w/ HD yesterday  Objective Vitals:   08/06/17 0900 08/06/17 1000 08/06/17 1100 08/06/17 1300  BP: (!) 99/49 (!) 77/53 (!) 87/68 (!) 131/114  Pulse: (!) 124 (!) 123 (!) 125 (!) 125  Resp: (!) 23 (!) 22 19 (!) 21  Temp:    98.8 F (37.1 C)  TempSrc:    Oral  SpO2: 93% 92% 92% 92%  Weight:      Height:       Physical Exam General: NAD talkative breathing easily, articulate very engaged Heart:tachy reg Lungs: grossly clear Abdomen:soft NT Extremities: SCDs and compression stockins ^^ dependent edema in thighs/buttocks Dialysis Access: right IJ and left AVF + bruit with erythema crusted  incision   Additional Objective Labs: Basic Metabolic Panel: Recent Labs  Lab 08/02/17 0326 08/03/17 0902 08/05/17 0437  NA 137 141 137  K 5.2* 4.0 3.8  CL 103 104 102  CO2 21* 25 23  GLUCOSE 107* 79 91  BUN 50* 31* 32*  CREATININE 7.25* 5.22* 5.53*  CALCIUM 6.4* 7.2* 8.2*  PHOS 5.8* 4.3 3.9   Liver Function Tests: Recent Labs  Lab 08/02/17 0326 08/03/17 0902 08/05/17 0437  ALBUMIN 2.7* 2.7* 2.9*   No results for input(s): LIPASE, AMYLASE in the last 168 hours. CBC: Recent Labs  Lab 08/01/17 0801 08/02/17 0326 08/03/17 0902 08/06/17 0504  WBC 6.6 7.4 7.0 6.5  HGB 11.9* 11.8* 11.7* 11.7*  HCT 38.1* 36.7* 36.9* 35.6*  MCV 108.5* 107.9* 107.3* 108.5*  PLT 79* 85* 87* 75*   Blood Culture    Component Value Date/Time   SDES BLOOD RIGHT HAND 08/01/2017 1711   SPECREQUEST  08/01/2017 1711    BOTTLES DRAWN AEROBIC AND ANAEROBIC Blood Culture adequate volume   CULT NO GROWTH 5 DAYS 08/01/2017 1711   REPTSTATUS 08/06/2017 FINAL 08/01/2017 1711    Cardiac Enzymes: Recent Labs  Lab 08/01/17 1656 08/01/17 2139 08/02/17 0326  TROPONINI 0.07* 0.08* 0.08*  Lab Results  Component Value Date   INR 1.65 08/06/2017   INR 1.55 08/05/2017   INR 1.61 08/04/2017   Studies/Results: No results found. Medications: . sodium chloride    . sodium chloride    . ferric gluconate (FERRLECIT/NULECIT) IV Stopped (08/03/17 1235)   . arformoterol  15 mcg Nebulization BID  . atorvastatin  20 mg Oral QHS  . cephALEXin  500 mg Oral Daily  . citalopram  20 mg Oral QHS  . midodrine  10 mg Oral TID  . multivitamin  1 tablet Oral QHS  . pantoprazole  40 mg Oral BID  . umeclidinium bromide  1 puff Inhalation Daily  . warfarin  5 mg Oral ONCE-1800  . Warfarin - Pharmacist Dosing Inpatient   Does not apply [phone]

## 2017-08-06 NOTE — Progress Notes (Signed)
   Subjective: Patient states he was feeling well today, denies any chest pain or shortness of breath.  Worked with physical therapy yesterday was not able to get up out of bed at that visit but was assured he would some time today. Doesn't notice the tachycardia due to "I've had it so long".    Objective:  Vital signs in last 24 hours: Vitals:   08/06/17 0218 08/06/17 0300 08/06/17 0732 08/06/17 0742  BP:  111/86 91/68   Pulse: (!) 120 (!) 122 (!) 122   Resp: 15 20 (!) 26   Temp:  98.9 F (37.2 C) 98.8 F (37.1 C)   TempSrc:  Oral Oral   SpO2: 95% 93% 93% 93%  Weight:  181 lb 14.1 oz (82.5 kg)    Height:       General: Elderly male resting comfortably in bed, no acute distress Neck: no JVD CV: Tachycardic, 2/6 systolic murmur heard loudest in Tricuspid area Resp: Occasional faint wheeze, normal work of breathing, no distress  Abd: Soft, +BS, non-tender  Extr: Ted hose in place, no LE edema Skin: Clean dressing over I&D site, no residual drainage, clean wound    Assessment/Plan:  Symptomatic Hypotension in setting of HFrEF, Mitral regurg, RV failure    Patient has history of chronic hypotension currently on midodrine, baseline pressures are around 61P-509T systolic.  Presented with a further decrease in blood pressure to systolics of 26Z. Echo showed severe RV dilation and decreased function.  His blood pressure on rounds this morning was 12W systolic. Palliative care discussions introduced by Nephrology and IM team --Tele, monitor vitals --Cont home midodrine   --PT/OT --Vascular surgery will ligate fistula if it is felt to improve ability to obtain HD but will defer for now and can have done outpatient if continues to decompensate. --Palliative care consulted, pt wants full scope of care at this time, recommended ambulatory consult to be placed on discharge    -Blood pressure stable during and after dialysis yesterday  ESRD s/p recent LUE AVF placement Patient has history of  ESRD on MWF HD.  He did not receive HD prior to presentation due to hypotension. Pt had a LUE AVF recently placed, site appeared slightly erythematous and ultrasound showed small fluid collection of AC fossa around fistula.  Vascular surgery consulted and feel observed changes are within expected postoperative course. --HD per nephrology, appreciate assistance  --completed dialysis yesterday with no issues. --Spoke with nephrology, they feel the patient is doing well enough to send home and will consider the ligation of AV fistula scheduled outpatient if it becomes necessary.  For now leave in place.  Chronic Atrial Flutter Patient has a history of chronic atrial flutter with multiple cardioversions and failed ablation at [facility].  Currently on metoprolol 12.5 twice daily for rate control which is limited due to hypotension.  Also on warfarin for anticoagulation (GI bleed in the past).  Cardiology consulted and given its chronicity, feel his R sided failure is more likely to be main factor of his hypotension.   Superficial Skin Abscess Pt has a superficial abscess on his back, overlying L scapula. Bedside US revealed simple appearing fluid that would be amenable to drainage, I &D performed and is healing well. Will transition cefazolin to po cephalexin for 5 days total.     Dispo: Anticipated discharge later today vs tomorrow  Katherine Roan, MD 08/06/2017, 10:24 AM Pager: [phone]

## 2017-08-06 NOTE — Progress Notes (Signed)
ANTICOAGULATION CONSULT NOTE - Follow-up Consult  Pharmacy Consult for Warfarin Indication: atrial fibrillation  Labs: Recent Labs    08/04/17 0442 08/05/17 0437 08/06/17 0504  HGB  --   --  11.7*  HCT  --   --  35.6*  PLT  --   --  75*  LABPROT 19.0* 18.4* 19.4*  INR 1.61 1.55 1.65  CREATININE  --  5.53*  --    Assessment: 71 YOM here with dizziness and SOB. Pharmacy consulted to resume home warfarin therapy for h/o AFib. INR on admission is supratherapeutic at 3.26.  INR marginally higher than previous but did not trend down any lower. Hgb 11.7 and PLTc remains chronically low.   Home warfarin dose: 7.5 mg on Mon; 3.75 mg on all other days.   Goal of Therapy:  INR 2-3 Monitor platelets by anticoagulation protocol: Yes   Plan:  1. Warfarin 5 mg x 1 this evening  2. Daily INR, CBC  Vincenza Hews, PharmD, BCPS 08/06/2017, 11:23 AM

## 2017-08-06 NOTE — Progress Notes (Signed)
  Date: 08/06/2017  Patient name: [name]  Medical record number: [id number]  Date of birth: Mar 22, 1956   I have seen and evaluated this patient and I have discussed the plan of care with the house staff. Please see Dr. Maudie Flakes note for complete details. I concur with his findings.    Sid Falcon, MD 08/06/2017, 8:43 PM

## 2017-08-06 NOTE — Plan of Care (Signed)
  Progressing Education: Knowledge of General Education information will improve 08/06/2017 0321 - Progressing by Levonne Hubert, RN Health Behavior/Discharge Planning: Ability to manage health-related needs will improve 08/06/2017 0321 - Progressing by Levonne Hubert, RN Clinical Measurements: Ability to maintain clinical measurements within normal limits will improve 08/06/2017 0321 - Progressing by Levonne Hubert, RN Will remain free from infection 08/06/2017 0321 - Progressing by Levonne Hubert, RN Diagnostic test results will improve 08/06/2017 0321 - Progressing by Levonne Hubert, RN Respiratory complications will improve 08/06/2017 0321 - Progressing by Levonne Hubert, RN Cardiovascular complication will be avoided 08/06/2017 0321 - Progressing by Levonne Hubert, RN Activity: Risk for activity intolerance will decrease 08/06/2017 0321 - Progressing by Levonne Hubert, RN Nutrition: Adequate nutrition will be maintained 08/06/2017 0321 - Progressing by Levonne Hubert, RN Coping: Level of anxiety will decrease 08/06/2017 0321 - Progressing by Levonne Hubert, RN Elimination: Will not experience complications related to bowel motility 08/06/2017 0321 - Progressing by Levonne Hubert, RN Will not experience complications related to urinary retention 08/06/2017 0321 - Progressing by Levonne Hubert, RN Pain Managment: General experience of comfort will improve 08/06/2017 0321 - Progressing by Levonne Hubert, RN Safety: Ability to remain free from injury will improve 08/06/2017 0321 - Progressing by Levonne Hubert, RN Skin Integrity: Risk for impaired skin integrity will decrease 08/06/2017 0321 - Progressing by Levonne Hubert, RN

## 2017-08-07 DIAGNOSIS — Z515 Encounter for palliative care: Secondary | ICD-10-CM

## 2017-08-07 LAB — CBC
HEMATOCRIT: 35.3 % — AB (ref 39.0–52.0)
HEMOGLOBIN: 11.5 g/dL — AB (ref 13.0–17.0)
MCH: 35.8 pg — ABNORMAL HIGH (ref 26.0–34.0)
MCHC: 32.6 g/dL (ref 30.0–36.0)
MCV: 110 fL — AB (ref 78.0–100.0)
PLATELETS: 82 10*3/uL — AB (ref 150–400)
RBC: 3.21 MIL/uL — ABNORMAL LOW (ref 4.22–5.81)
RDW: 19.4 % — ABNORMAL HIGH (ref 11.5–15.5)
WBC: 6.6 10*3/uL (ref 4.0–10.5)

## 2017-08-07 LAB — PROTIME-INR
INR: 1.7
PROTHROMBIN TIME: 19.9 s — AB (ref 11.4–15.2)

## 2017-08-07 MED ORDER — WARFARIN SODIUM 5 MG PO TABS
7.5000 mg | ORAL_TABLET | Freq: Once | ORAL | Status: AC
  Administered 2017-08-07: 7.5 mg via ORAL
  Filled 2017-08-07: qty 2

## 2017-08-07 MED ORDER — ONDANSETRON 4 MG PO TBDP
4.0000 mg | ORAL_TABLET | Freq: Once | ORAL | Status: AC
  Administered 2017-08-07: 4 mg via ORAL
  Filled 2017-08-07: qty 1

## 2017-08-07 MED ORDER — METOPROLOL TARTRATE 5 MG/5ML IV SOLN
2.5000 mg | Freq: Once | INTRAVENOUS | Status: AC
  Administered 2017-08-07: 2.5 mg via INTRAVENOUS
  Filled 2017-08-07: qty 5

## 2017-08-07 NOTE — Progress Notes (Signed)
Monitor rang out v tach, appeared to be bursts of SVT or a fib RVR. EKG done and strip saved. Pt remains asymptomatic and BP remains stable. MD notified.

## 2017-08-07 NOTE — Progress Notes (Addendum)
Messaged MD IMT 3 beats VT, Hx Afib, overnight ST in high 120s, asymtomatic

## 2017-08-07 NOTE — Progress Notes (Signed)
Daily Progress Note   Patient Name: [name]       Date: 08/07/2017 DOB: 1956-04-10  Age: 61 y.o. MRN#: [id number] Attending Physician: Sid Falcon, MD Primary Care Physician: Algis Greenhouse, MD Admit Date: 08/01/2017  Reason for Consultation/Follow-up: Establishing goals of care and Psychosocial/spiritual support  Subjective: Met with patient and patient's wife this afternoon.  Patient and wife had spoken to Dr. Melvia Heaps with nephrology, and have elected DNR status.  Wife is very tearful ,however she is relieved that this decision to become DNR has been made she shares with me.  She states "I have been worried about this for so long".  Apparently patient's wife had a family member who was intubated, and unable to come off the ventilator and she witnessed how difficult it was for the family to make the decision to withdraw care.   She states she was worried that one day she would have to go through that with her husband  [name] acknowledges talking about end-of-life and topics such as DNR, full code etc. is not something that he is comfortable with and has avoided talking to his wife about.  He has been intubated in the past and has no memory of this experience and was able to come off the ventilator but that was before he had a worsening of his cardiac function.  We did spend time talking about the risks and benefit of aggressive cardiopulmonary resuscitation particularly in the setting of multisystem frailty.  He does seem to understand that his DNR status and no way means do not treat  He is hopeful that ligation of his graft can help his blood pressure and help him to be more successful with hemodialysis.  Length of Stay: 6  Current Medications: Scheduled Meds:  . arformoterol  15  mcg Nebulization BID  . atorvastatin  20 mg Oral QHS  . citalopram  20 mg Oral QHS  . midodrine  10 mg Oral TID  . multivitamin  1 tablet Oral QHS  . pantoprazole  40 mg Oral BID  . umeclidinium bromide  1 puff Inhalation Daily  . warfarin  7.5 mg Oral ONCE-1800  . Warfarin - Pharmacist Dosing Inpatient   Does not apply q1800    Continuous Infusions: . ferric gluconate (FERRLECIT/NULECIT) IV Stopped (08/03/17 1235)  PRN Meds: acetaminophen **OR** acetaminophen, albuterol  Physical Exam  Constitutional: He is oriented to person, place, and time. He appears well-developed and well-nourished.  HENT:  Head: Normocephalic and atraumatic.  Neck: Normal range of motion.  Cardiovascular: Normal rate.  Pulmonary/Chest: Effort normal.  Musculoskeletal: Normal range of motion.  Neurological: He is alert and oriented to person, place, and time.  Skin: Skin is warm and dry.  Psychiatric: He has a normal mood and affect. His behavior is normal. Thought content normal.  Nursing note and vitals reviewed.           Vital Signs: BP (!) 84/39   Pulse (!) 122   Temp 98.8 F (37.1 C) (Oral)   Resp 15   Ht 5' 10"  (1.778 m)   Wt 100.8 kg (222 lb 3.6 oz)   SpO2 96%   BMI 31.89 kg/m  SpO2: SpO2: 96 % O2 Device: O2 Device: Nasal Cannula O2 Flow Rate: O2 Flow Rate (L/min): 4 L/min  Intake/output summary:   Intake/Output Summary (Last 24 hours) at 08/07/2017 1607 Last data filed at 08/06/2017 2214 Gross per 24 hour  Intake 290 ml  Output -  Net 290 ml   LBM: Last BM Date: 08/06/17 Baseline Weight: Weight: 77.1 kg (170 lb) Most recent weight: Weight: 100.8 kg (222 lb 3.6 oz)       Palliative Assessment/Data:    Flowsheet Rows     Most Recent Value  Intake Tab  Referral Department  Hospitalist  Unit at Time of Referral  Intermediate Care Unit  Palliative Care Primary Diagnosis  Nephrology  Date Notified  08/05/17  Palliative Care Type  New Palliative care  Reason for  referral  Clarify Goals of Care  Date of Admission  08/01/17  Date first seen by Palliative Care  08/05/17  # of days Palliative referral response time  0 Day(s)  # of days IP prior to Palliative referral  4  Clinical Assessment  Palliative Performance Scale Score  50%  Pain Max last 24 hours  Not able to report  Pain Min Last 24 hours  Not able to report  Dyspnea Max Last 24 Hours  Not able to report  Dyspnea Min Last 24 hours  Not able to report  Nausea Max Last 24 Hours  Not able to report  Nausea Min Last 24 Hours  Not able to report  Anxiety Max Last 24 Hours  Not able to report  Anxiety Min Last 24 Hours  Not able to report  Other Max Last 24 Hours  Not able to report  Psychosocial & Spiritual Assessment  Palliative Care Outcomes  Patient/Family meeting held?  Yes  Who was at the meeting?  pt,  nephrology, cardiology  Palliative Care Outcomes  Clarified goals of care      Patient Active Problem List   Diagnosis Date Noted  . Orthostatic hypotension 08/01/2017  . Acute pulmonary edema ([place]) 12/06/2016  . CAP (community acquired pneumonia) 11/15/2016  . Acute respiratory failure with hypoxia ([place]) 11/15/2016  . COPD exacerbation ([place]) 11/15/2016  . Long term current use of amiodarone 09/28/2016  . Mixed hyperlipidemia 07/12/2016  . Restless leg syndrome 07/12/2016  . CHF (congestive heart failure) ([place]) 07/09/2016  . Coronary artery disease 07/09/2016  . Acute CHF ([place]) 07/09/2016  . Gastrointestinal hemorrhage 06/16/2016  . COPD GOLD III  03/24/2016  . Elevated troponin 03/24/2016  . Chronic diastolic congestive heart failure ([place]) 03/24/2016  . SOB (shortness of breath) 03/24/2016  . Left  leg swelling   . Macrocytic anemia   . Acute blood loss anemia 02/19/2016  . Groin hematoma 02/19/2016  . NSVT (nonsustained ventricular tachycardia) ([place]) 02/19/2016  . Bifascicular block   . Hypoxia   . Chronic anticoagulation-Coumadin 02/15/2016  . H/O aortic valve  replacement 02/15/2016  . Hx of CABG x 2 2014 02/15/2016  . Acute ST elevation myocardial infarction (STEMI) ([place]) 02/15/2016  . Hemorrhoids 10/09/2015  . Recurrent major depressive disorder, in full remission ([place]) 10/09/2015  . Encounter for therapeutic drug monitoring 06/04/2015  . Paroxysmal atrial flutter ([place]) 06/04/2015  . Supraventricular tachycardia ([place]) 06/04/2015  . Anemia   . Recurrent coronary arteriosclerosis after percutaneous transluminal coronary angioplasty   . Typical atrial flutter ([place]) 05/21/2015  . Paroxysmal atrial fibrillation ([place]) 02/21/2015  . CAD of artery bypass graft-occluded SVG-LAD 02/15/16   . Hypertension   . Acute on chronic kidney failure ([place]) 05/01/2013  . Metabolic acidosis 63/78/5885  . Chronic constipation 04/20/2013  . 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 Constipation, chronic 04/20/2013  . 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 Periodontal disease 04/20/2013  . Abscess of aortic root 04/19/2013  . 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 Non-ST elevation MI (NSTEMI) ([place]) 01/09/2013  . Obstructive sleep apnea syndrome 01/09/2013  . Aortic valve disease 12/15/2012  . GPC Bacteremia 11/14/2012  . Neck pain 05/18/2012  . Kidney transplant failure 02/14/2012  . Multiple sclerosis ([place]) 02/14/2012  . Thrombocytopenia ([place]) 02/12/2012  . Hypotension 02/12/2012  . Syncope and collapse 02/12/2012  . Aortic valve insufficiency 02/12/2012  . End-stage renal disease ([place]) 02/12/2012  . Left-sided heart failure ([place]) 01/19/2011    Palliative Care Assessment & Plan   Patient Profile: 61 y.o. male  with past medical history of end-stage renal disease (has been on hemodialysis since 2000; kidney transplant 2008 but rejected after 2 years), coronary artery disease status post CABG 2014, history of STEMI as well as non-STEMI, congestive heart failure, a flutter (failed cardioversion, ablation), COPD, MS, lung cancer status post  radiation treatment admitted on 08/01/2017 with hypertension, dizziness.  Patient went to dialysis and was found to be hypotensive and sent from dialysis to the hospital.  He is on midodrine at home.  Patient reports new AV fistula placed 3 weeks ago and that he has been dizzy, his low blood pressure has been worse since that time.  He also shares back in June 2018 he was seen at [facility] , an A-line was placed, and at that time he was told "my blood pressure is higher than what it reads when you put the cuff on my arm"..   Consult ordered for goat goals of care, "multiple comorbidities, hypertension increased difficulty with management.   After meeting with nephrology on 08/07/2017 patient has now elected DNR status with goal to continue with hemodialysis as long as he is able to   Recommendations/Plan:  DNR/DNI  Continue with hemodialysis in the community  Vascular consult for graft ligation scheduled for 08/08/2017.  Patient is receptive to further surgery or changing access in order to improve his blood pressure  Goals of Care and Additional Recommendations:  Limitations on Scope of Treatment: Full Scope Treatment except for DNR/DNI  Code Status:    Code Status Orders  (From admission, onward)        Start     Ordered   08/07/17 1253  Do not attempt resuscitation (DNR)  Continuous    Question Answer Comment  In the event of cardiac or respiratory ARREST Do  not call a "code blue"   In the event of cardiac or respiratory ARREST Do not perform Intubation, CPR, defibrillation or ACLS   In the event of cardiac or respiratory ARREST Use medication by any route, position, wound care, and other measures to relive pain and suffering. May use oxygen, suction and manual treatment of airway obstruction as needed for comfort.      08/07/17 1252    Code Status History    Date Active Date Inactive Code Status Order ID Comments User Context   08/01/2017 12:09 08/07/2017 12:52 Full Code [id number]   Zada Finders, MD ED   12/20/2016 15:35 12/21/2016 17:10 Full Code [id number]  Baldwin Jamaica, PA-C Inpatient   12/06/2016 02:36 12/09/2016 19:02 Full Code [id number]  Rise Patience, MD ED   11/15/2016 22:16 11/18/2016 16:52 Full Code [id number]  Toy Baker, MD Inpatient   07/09/2016 10:48 07/13/2016 19:21 Full Code [id number]  Norman Herrlich, MD ED   03/24/2016 21:26 03/27/2016 19:02 Full Code [id number]  Vianne Bulls, MD ED   02/15/2016 19:53 02/19/2016 19:18 Full Code [id number]  Alver Sorrow, RN Inpatient   02/15/2016 10:39 02/15/2016 19:53 Full Code [id number]  Erlene Quan, PA-C Inpatient   05/26/2015 18:05 05/27/2015 15:43 Full Code [id number]  Leonie Man, MD Inpatient   05/21/2015 16:34 05/26/2015 18:05 Full Code [id number]  Almyra Deforest, [place] Inpatient   01/09/2013 03:34 01/11/2013 20:57 Full Code [id number]  Rise Patience, MD Inpatient   12/04/2012 06:34 12/05/2012 19:23 Full Code [id number]  Reyne Dumas, MD Inpatient   11/12/2012 22:50 11/16/2012 13:48 Full Code [id number]  Derrill Kay, MD Inpatient   02/13/2012 03:53 02/15/2012 18:56 Full Code [id number]  Duffy, Terese Door, RN Inpatient    Advance Directive Documentation     Most Recent Value  Type of Advance Directive  Living will, Healthcare Power of Attorney  Pre-existing out of facility DNR order (yellow form or pink MOST form)  No data  "MOST" Form in Place?  No data       Prognosis:   Unable to determine  Discharge Planning:  To home with continued plans for community dialysis   Thank you for allowing the Palliative Medicine Team to assist in the care of this patient.   Time In: 1530 Time Out: 1555 Total Time 25 min Prolonged Time Billed  no       Greater than 50%  of this time was spent counseling and coordinating care related to the above assessment and plan.  Dory Horn, NP  Please contact Palliative Medicine Team phone at [phone] for questions and concerns.

## 2017-08-07 NOTE — Progress Notes (Signed)
   Discussed patient progress with Dr. Jonnie Finner and attempted to answer patient questions regarding intervention on the avf. Fistula has some pulsatility and unlikely banding would be of benefit. Question remains whether or not to ligate the avf but seems unnecessary currently. Will discuss with Dr. Oneida Alar.   Cameron Wallander C. Donzetta Matters, MD [facility] Office: [phone] Pager: [phone]

## 2017-08-07 NOTE — Progress Notes (Signed)
   Subjective:  Patient denies chest pain, palpitations, shortness of breath. He is in agreement with possible ligation of his AVF.  Objective:  Vital signs in last 24 hours: Vitals:   08/07/17 0955 08/07/17 1000 08/07/17 1100 08/07/17 1200  BP: (!) 106/41 (!) 108/46 111/88 (!) 84/39  Pulse: (!) 127 (!) 126 (!) 124 (!) 122  Resp: (!) 23 (!) 21 18 15   Temp:      TempSrc:      SpO2: (!) 81% 92% 93% 96%  Weight:      Height:       General: NAD CV: Tachycardic, 2/6 systolic murmur Resp: CTAB with decreased breath sounds at bases, no increased work of breathing  Abd: Soft, +BS, non-tender  Extr: Ted hose in place, no LE edema Skin: Clean dressing over I&D site, no residual drainage, clean wound    Assessment/Plan:  Symptomatic Hypotension in setting of HFrEF, Mitral regurg, RV failure    Patient had discussion with Dr. Jonnie Finner - due to high risk HD which patient wants to continue outpatient, he has been made DNR. He remains hypotensive and tachycardic. VVS - Dr. Oneida Alar to see patient tomorrow to evaluate for possible ligation of his AVF. --Tele, monitor vitals --Cont home midodrine   --PT/OT    ESRD s/p recent LUE AVF placement Patient  --HD per nephrology, appreciate assistance   Chronic Atrial Flutter Continues to be tachycardic - his BP is limiting any rate control. Also on warfarin for anticoagulation (GI bleed in the past).    Superficial Skin Abscess Finished antibiotic course.  Dispo: Anticipated discharge in 1-2 days.  Alphonzo Grieve, MD 08/07/2017, 5:22 PM

## 2017-08-07 NOTE — Progress Notes (Signed)
ANTICOAGULATION CONSULT NOTE - Follow-up Consult  Pharmacy Consult for Warfarin Indication: atrial fibrillation  Labs: Recent Labs    08/05/17 0437 08/06/17 0504 08/07/17 0403  HGB  --  11.7* 11.5*  HCT  --  35.6* 35.3*  PLT  --  75* 82*  LABPROT 18.4* 19.4* 19.9*  INR 1.55 1.65 1.70  CREATININE 5.53*  --   --    Assessment: 60 YOM here with dizziness and SOB. Pharmacy consulted to resume home warfarin therapy for h/o AFib. INR on admission is supratherapeutic at 3.26.  INR remains subtherapeutic. Hgb 11.5 and PLTc remains chronically low.   Home warfarin dose: 7.5 mg on Mon; 3.75 mg on all other days.   Goal of Therapy:  INR 2-3 Monitor platelets by anticoagulation protocol: Yes   Plan:  1. Increase warfarin to 7.5 mg x 1 this evening   2. Daily INR, CBC  Vincenza Hews, PharmD, BCPS 08/07/2017, 11:48 AM

## 2017-08-07 NOTE — Discharge Summary (Signed)
Name: [name] MRN: [id number] DOB: 12-17-1955 61 y.o. 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 Tolerates oral.   Penicillins Other (See Comments)   MIGRAINE Has patient had a PCN reaction causing immediate rash, facial/tongue/throat swelling, SOB or lightheadedness with hypotension: no Has patient had a PCN reaction causing severe rash involving mucus membranes or skin necrosis: No Has patient had a PCN reaction that required hospitalization No Has patient had a PCN reaction occurring within the last 10 years: No If all of the above answers are "NO", then may proceed with Cephalosporin use.      Medication List    STOP taking these medications   metoprolol tartrate 25 MG tablet Commonly known as:  LOPRESSOR     TAKE these medications   acetaminophen 500 MG tablet Commonly known as:  TYLENOL Take 1,000 mg every 6 (six) hours as needed by mouth for mild pain.   albuterol 1.25 MG/3ML nebulizer solution Commonly known as:  ACCUNEB Take 3 mLs (1.25 mg total) by nebulization every 6 (six) hours as needed for wheezing.   albuterol 108 (90 Base) MCG/ACT inhaler Commonly known as:  PROAIR HFA Inhale 2 puffs  every 6 (six) hours as needed into the lungs for wheezing or shortness of breath.   amiodarone 200 MG tablet Commonly known as:  PACERONE Take 1 tablet (200 mg total) by mouth daily.   atorvastatin 40 MG tablet Commonly known as:  LIPITOR Take 0.5 tablets (20 mg total) by mouth at bedtime.   calcium acetate 667 MG capsule Commonly known as:  PHOSLO Take 4,002 mg by mouth 3 (three) times daily with meals.   citalopram 20 MG tablet Commonly known as:  CELEXA Take 20 mg by mouth at bedtime.   diphenhydrAMINE 2 % cream Commonly known as:  BENADRYL Apply 1 application topically daily as needed for itching.   docusate sodium 100 MG capsule Commonly known as:  COLACE Take 100 mg by mouth 2 (two) times daily.   folic acid 1 MG tablet Commonly known as:  FOLVITE Take 1 mg by mouth daily.   gabapentin 300 MG capsule Commonly known as:  NEURONTIN Take 300 mg by mouth at bedtime. Patient taking two 317m capsules at bedtime sometime.   midodrine 10 MG tablet Commonly known as:  PROAMATINE Take 10 mg by mouth 3 (three) times daily.   multivitamin Tabs tablet Take 1 tablet by mouth daily.   nitroGLYCERIN 0.4 MG SL tablet Commonly known as:  NITROSTAT Place 0.4 mg under the tongue every 5 (five) minutes as needed for chest pain (max 3 doses - if no relief call MD).   oxyCODONE 5 MG immediate release tablet  Commonly known as:  ROXICODONE Take 1 tablet (5 mg total) every 6 (six) hours as needed by mouth.   OXYGEN 2lpm with sleep and "at home"   oxymetazoline 0.05 % nasal spray Commonly known as:  AFRIN Place 1 spray into both nostrils 2 (two) times daily as needed for congestion.   pantoprazole 40 MG tablet Commonly known as:  PROTONIX Take 1 tablet (40 mg total) by mouth 2 (two) times daily.   polyethylene glycol packet Commonly known as:  MIRALAX / GLYCOLAX Take 17 g daily as needed by mouth for mild constipation. Mix in 8 oz liquid and drink   STIOLTO RESPIMAT  2.5-2.5 MCG/ACT Aers Generic drug:  Tiotropium Bromide-Olodaterol INHALE 2 PUFFS BY MOUTH DAILY   warfarin 7.5 MG tablet Commonly known as:  COUMADIN Take as directed. If you are unsure how to take this medication, talk to your nurse or doctor. Original instructions:  Take 0.5-1 tablets (3.75-7.5 mg total) See admin instructions by mouth. What changed:  additional instructions       Disposition and follow-up:   Mr.[name] was discharged from [facility] in [place] condition.  At the hospital follow up visit please address:  1.    Syptomatic Hypotension in setting of CHF, RV Failure -continue midodrine -ensure pt continuing to perfuse and have an appropriate MAP  ESRD s/p recent LUE AVF Placement  -follow up with nephrology -continue dialysis  Chronic Atrial Flutter -continue amiodarone -will need close INR checks due to potential DDI with amiodarone and warfarin     2.  Labs / imaging needed at time of follow-up: PT INR  3.  Pending labs/ test needing follow-up: none  Follow-up Appointments: Follow-up Information    Cameron Blanks, MD Follow up in 2 week(s).   Specialty:  Cardiology Why:  Pt has appt with cardiology on 12/20 Contact information: [place]. [address] [place] [address] [phone]        Weiss, Cameron E, MD Follow up in 2 week(s).   Specialties:  Vascular Surgery, Cardiology Why:  Pt has appt on 12/20 Contact information: [address]  [place] [address] [phone]        Cameron Greenhouse, MD Follow up in 4 day(s).   Specialty:  Family Medicine Why:  Pt needs INR checked either with PCP or at dialysis Contact information: [address] [place] [address] [phone]           Cardiology Kaplan 12/20 Vascular surgery Ruta Hinds 12/20  Hospital Course by problem list:  Syptomatic Hypotension in setting of CHF, RV Failure Patient has history of chronic hypertension  currently on midodrine with baseline pressures around 62I-297L systolic.  He presented with worsening hypotension to systolics of 89Q with associated dizziness.  Symptom onset seemed to be temporally correlated with AVF placement.  There was concern for worsening heart failure due to high output.  Echocardiogram was most significant for severe RV dilation and decreased RV systolic function, likely the main contributor of his hypotension and tenuous fluid status with HD.  Palate of care discussions were introduced and patient expressed a desire for full scope of care until possible, palliative will follow as an outpatient. Patient required pressors at one point and was transferred to the ICU.  During his stay in the ICU the decision was made to ligate the patient's AV fistula. After ligation his blood pressures  returned to his baseline prior to discharge. Both cardiology and nephrology felt the patient  was stable enough for discharge.  Nephrology discussed the high risk nature of dialysis for this patient in the future.  The patient agreed to DO NOT RESUSCITATE status but would still like to pursue dialysis. He was discharged with plans for outpatient dialysis.    ESRD s/p recent LUE AVF Placement  Patient has a history of ESRD on MWF HD.  He did not receive HD prior to presentation due to hypertension and showed tense pitting edema of dependent tissues (upper thigh, sacrum).  As described above, he had a LUE AVF recently placed.  At present dictation, the site appeared slightly erythematous ultrasound showed small fluid collection of the [facility] fossa.  Vascular surgery consulted and felt observed changes were within expected postoperative course.  Later, his case was discussed with his vascular surgeon he stated they would be willing to ligate the fistula if necessary for the patient to better tolerate HD.  Ultimately shortly before his discharge his AV fistula was ligated which seemed to mildly improved his  hypotension.  He was discharged with blood pressures that were in his baseline range.  Chronic Atrial Flutter Patient has a history of chronic atrial flutter and multiple unsuccessful cardioversions and ablation.  Rates during admission varied from 110s-140s.  Cardiology consulted and felt this was unlikely to be the main driver of his hypotension due to its chronicity.  His home metoprolol was held due to his worsening hypotension. He was transitioned to IV amiodarone. He was sent home on amiodarone 231m daily with HR in the upper 90's to low 100's.    Superficial Skin Abscess  Patient complained of a superficial abscess-like lesion on his left upper back/shoulder.  He was started on cefazolin at presentation due to concern for fistula infection and a course of antibiotics was continued given his concurrent abscess.  Bedside ultrasound showed a simple fluid collection amenable to drainage.  I&D performed with purulent and serosanguineous drainage obtained, no complications and drainage site looked clean and appropriate prior to discharge.  Discharge Vitals:   BP 97/83   Pulse (!) 110   Temp (!) 97.5 F (36.4 C)   Resp 16   Ht _0  (1.778 m)   Wt 179 lb 3.7 oz (81.3 kg)   SpO2 93%   BMI 25.72 kg/m   Pertinent Labs, Studies, and Procedures:   CBC Latest Ref Rng & Units 08/12/2017 08/11/2017 08/10/2017  WBC 4.0 - 10.5 K/uL 7.9 9.3 9.8  Hemoglobin 13.0 - 17.0 g/dL 11.6(L) 12.0(L) 11.9(L)  Hematocrit 39.0 - 52.0 % 36.2(L) 36.9(L) 36.7(L)  Platelets 150 - 400 K/uL 104(L) 133(L) 108(L)   BMP Latest Ref Rng & Units 08/12/2017 08/11/2017 08/10/2017  Glucose 65 - 99 mg/dL 107(H) 93 127(H)  BUN 6 - 20 mg/dL 44(H) 64(H) 48(H)  Creatinine 0.61 - 1.24 mg/dL 5.96(H) 6.79(H) 5.87(H)  BUN/Creat Ratio 10 - 24 - - -  Sodium 135 - 145 mmol/L 137 137 136  Potassium 3.5 - 5.1 mmol/L 4.0 4.7 5.2(H)  Chloride 101 - 111 mmol/L 100(L) 97(L) 96(L)  CO2 22 - 32 mmol/L _1 Calcium 8.9 - 10.3 mg/dL 6.8(L)  7.0(L) 7.0(L)     Component 2wk ago  Specimen Description BLOOD RIGHT HAND   Special Requests BOTTLES DRAWN AEROBIC AND ANAEROBIC Blood Culture adequate volume   Culture NO GROWTH 5 DAYS   Report Status 08/06/2017 FINAL   Resulting Agency [facility]         ------------------------------------------------------------------- Transthoracic Echocardiography   Patient:  Colden, Samaras MR #:       [id number] Study Date: 08/03/2017 Gender:     M Age:        55 Height:     177.8 cm Weight:     80 kg BSA:        2 m^2 Pt. Status: Room:       [facility]    SONOGRAPHER  Johny Chess, RDCS, CCT  ATTENDING    Al Pimple, Inpatient  ADMITTING    Aldine Contes [id number]  Sherrlyn Hock, [place] [phone]   cc:   ------------------------------------------------------------------- LV EF: 55%   ------------------------------------------------------------------- Indications:      Atrial fibrillation - 427.31.   ------------------------------------------------------------------- History:   PMH:   Coronary artery disease.  Chronic obstructive pulmonary disease.  Risk factors:  Aortic valve replacement. End stage renal disease. Multiple sclerosis.   ------------------------------------------------------------------- Study Conclusions   - Left ventricle: Septal flattening consistent with RV volume /   pressure overload. LVEF is approximately 55 to 60% The cavity   size was normal. Wall thickness was increased in a pattern of   mild LVH. Systolic function was normal. The estimated ejection   fraction was 55%. - Aortic valve: Bioprosthetic AVR with mild AS and mild AR. There   was mild regurgitation. Valve area (VTI): 2.12 cm^2. Valve area   (Vmax): 2.09 cm^2. Valve area (Vmean): 2.08 cm^2. - Mitral valve: MV has severely thickened annulus and leaflets with   severe MAC Severe MS by gradients and MR likely moderate and   eccentric not as well  visualize   as by last TEE. Calcified annulus. Moderately thickened leaflets   . Valve area by continuity equation (using LVOT flow): 1.78 cm^2. - Left atrium: The atrium was severely dilated. - Right ventricle: The cavity size was severely dilated. Systolic   function was moderately to severely reduced. - Right atrium: The atrium was severely dilated. - Atrial septum: No defect or patent foramen ovale was identified. - Tricuspid valve: There was severe regurgitation.   Discharge Instructions: Discharge Instructions    Diet - low sodium heart healthy   Complete by:  As directed    Discharge instructions   Complete by:  As directed    Mr. Ketcham, it was a pleasure to meet you.  Please continue to try and keep track of your blood pressure.  Continue taking your Midodrine.  You'll need close follow-up to check your INR.  Keep track of your heart rate and if it is abnormally fast please get an appointment with cardiology.  They can potentially increase your dose of amiodarone.   Increase activity slowly   Complete by:  As directed       Signed: Katherine Roan, MD 08/15/2017, 4:10 PM   Pager: [phone]

## 2017-08-07 NOTE — Progress Notes (Signed)
HR sustaining in 140s-150s after getting up to BR at 2000. IMTS on call pager notified, BP114/83 (94), asymptomatic. HR still 130s-140s at this time. Will continue to monitor.

## 2017-08-07 NOTE — Progress Notes (Signed)
Subjective:  Says that he feels good without shortness of breath.  Having some oxygen desaturations overnight.  Remains in atrial flutter with rapid ventricular response.  Blood pressure is low this morning but evidently renal felt it was better yesterday.  Objective:  Vital Signs in the last 24 hours: BP (!) 94/56 (BP Location: Right Leg)   Pulse (!) 124   Temp 98.8 F (37.1 C) (Oral)   Resp 20   Ht 5\' 10"  (1.778 m)   Wt 100.8 kg (222 lb 3.6 oz)   SpO2 93%   BMI 31.89 kg/m   Physical Exam: Pleasant male currently in no acute distress Lungs:  Clear  Cardiac: Rapid regular, 2/6 systolic murmur holosystolic heard at apex rhythm, normal S1 and S2, no S3 Extremities:  No edema present  Intake/Output from previous day: 12/01 0701 - 12/02 0700 In: 530 [P.O.:530] Out: -  Weight Filed Weights   08/06/17 0300 08/07/17 0403 08/07/17 0406  Weight: 82.5 kg (181 lb 14.1 oz) 84.7 kg (186 lb 11.7 oz) 100.8 kg (222 lb 3.6 oz)    Lab Results: Basic Metabolic Panel: Recent Labs    08/05/17 0437  NA 137  K 3.8  CL 102  CO2 23  GLUCOSE 91  BUN 32*  CREATININE 5.53*    CBC: Recent Labs    08/06/17 0504 08/07/17 0403  WBC 6.5 6.6  HGB 11.7* 11.5*  HCT 35.6* 35.3*  MCV 108.5* 110.0*  PLT 75* 82*    BNP    Component Value Date/Time   BNP 1,426.5 (H) 08/01/2017 1002   Telemetry: Personally reviewed.  Atrial flutter with rapid ventricular response rate currently around 124, 13 beat run of VT last night  Assessment/Plan: 1.  Hypotension thought to be due to a combination of cor pulmonale, preload reduction and newly placed AV fistula 2.  Chronic atrial flutter with rapid response currently not good candidate for ablation previously 3.  History of CAD  Recommendations:  Still having some desaturations and atrial flutter rate remains rapid.  Blood pressure low likely related to the shunt/preload issues.  Surprisingly not symptomatic however.  Kerry Hough  MD  [facility]  08/07/2017, 9:33 AM

## 2017-08-07 NOTE — Progress Notes (Signed)
IMTS paged again regarding HR 150s sustaining, BP remains stable and pt remains asymptomatic.

## 2017-08-07 NOTE — Progress Notes (Signed)
[facility] Progress Note   Dialysis Orders: [facility] MWF  4h   77kg   2K/ 3Ca bath   Hep none  R IJ TDC / (maturing LUA AVF 07/19/17) -venofer 50/wk   Assessment: 1. Hypotension - acute on chronic. BP's have improved some since admission. On midodrine 10 tid. Severe RHF on echo this admit. No specific Rx per cardiology.  NSVT last night. Patient has severe cardiac disease (afib/ uncont HR/ failed ablation/ severe RHF/ pulm HTN/ NSVT, etc), I told him that from here forward any hemodialysis procedure for him would be considered high-risk and that we could continue to try to dialyze him but w/ the condition of DNR status. Also gave him the option of hospice. Discussed all this w/ the wife present.  He declined hospice, agreed to DNR status and continued HD.  Will write DNR. Also he is interested in seeing if the surgeons can "tighten up" the fistula, if that would help any. Have d/w primary team.  2. Volume- up 3 kg by standing wts, no resp issue, sig dependent edema 3. ESRD - HD MWF 4. COPD/ pulm HTN - on home O2 5. Aflutter / fib -sinus tach on monitor hx failed ablation, can't take amio due to lung disease.  Home metoprolol is on hold; low BP limiting. Coumadin per pharmacy 6. Cellulitis L arm - at AVF wound site. On empiric abx. 7. Anemia of CKD - no esa needed, Hgb 11.7 8. MBD - cont meds 9. CAD hx CABG/ DES 10. Nutrition - add multivit alb 2.9    Plan: as above   Kelly Splinter MD [facility] pager [phone]   08/07/2017, 11:19 AM    Subjective:   No problems w/ HD yesterday  Objective Vitals:   08/07/17 0406 08/07/17 0700 08/07/17 0743 08/07/17 0745  BP:  (!) 106/59 (!) 94/56   Pulse: (!) 125 (!) 125 (!) 124   Resp: (!) 22 20 20    Temp: 98.3 F (36.8 C)  98.8 F (37.1 C)   TempSrc: Oral  Oral   SpO2: 94% 94% 94% 93%  Weight: 100.8 kg (222 lb 3.6 oz)     Height:       Physical Exam General: NAD talkative breathing easily, articulate  very engaged Heart:tachy reg Lungs: grossly clear Abdomen:soft NT Extremities: SCDs and compression stockins ^^ dependent edema in thighs/buttocks Dialysis Access: right IJ and left AVF + bruit with erythema crusted incision   Additional Objective Labs: Basic Metabolic Panel: Recent Labs  Lab 08/02/17 0326 08/03/17 0902 08/05/17 0437  NA 137 141 137  K 5.2* 4.0 3.8  CL 103 104 102  CO2 21* 25 23  GLUCOSE 107* 79 91  BUN 50* 31* 32*  CREATININE 7.25* 5.22* 5.53*  CALCIUM 6.4* 7.2* 8.2*  PHOS 5.8* 4.3 3.9   Liver Function Tests: Recent Labs  Lab 08/02/17 0326 08/03/17 0902 08/05/17 0437  ALBUMIN 2.7* 2.7* 2.9*   No results for input(s): LIPASE, AMYLASE in the last 168 hours. CBC: Recent Labs  Lab 08/01/17 0801 08/02/17 0326 08/03/17 0902 08/06/17 0504 08/07/17 0403  WBC 6.6 7.4 7.0 6.5 6.6  HGB 11.9* 11.8* 11.7* 11.7* 11.5*  HCT 38.1* 36.7* 36.9* 35.6* 35.3*  MCV 108.5* 107.9* 107.3* 108.5* 110.0*  PLT 79* 85* 87* 75* 82*   Blood Culture    Component Value Date/Time   SDES BLOOD RIGHT HAND 08/01/2017 1711   SPECREQUEST  08/01/2017 1711    BOTTLES DRAWN AEROBIC AND ANAEROBIC Blood  Culture adequate volume   CULT NO GROWTH 5 DAYS 08/01/2017 1711   REPTSTATUS 08/06/2017 FINAL 08/01/2017 1711    Cardiac Enzymes: Recent Labs  Lab 08/01/17 1656 08/01/17 2139 08/02/17 0326  TROPONINI 0.07* 0.08* 0.08*   Lab Results  Component Value Date   INR 1.70 08/07/2017   INR 1.65 08/06/2017   INR 1.55 08/05/2017   Studies/Results: No results found. Medications: . sodium chloride    . sodium chloride    . ferric gluconate (FERRLECIT/NULECIT) IV Stopped (08/03/17 1235)   . arformoterol  15 mcg Nebulization BID  . atorvastatin  20 mg Oral QHS  . citalopram  20 mg Oral QHS  . midodrine  10 mg Oral TID  . multivitamin  1 tablet Oral QHS  . pantoprazole  40 mg Oral BID  . umeclidinium bromide  1 puff Inhalation Daily  . Warfarin - Pharmacist Dosing  Inpatient   Does not apply [phone]

## 2017-08-07 NOTE — Progress Notes (Signed)
  Date: 08/07/2017  Patient name: [name]  Medical record number: [id number]  Date of birth: 13-Jun-1956   This patient's plan of care was discussed with the house staff. Please see Dr. Winfred Burn note for complete details. I concur with her findings.   Sid Falcon, MD 08/07/2017, 8:15 PM

## 2017-08-08 ENCOUNTER — Inpatient Hospital Stay (HOSPITAL_COMMUNITY): Payer: Medicare Other

## 2017-08-08 ENCOUNTER — Other Ambulatory Visit: Payer: Self-pay

## 2017-08-08 DIAGNOSIS — I5032 Chronic diastolic (congestive) heart failure: Secondary | ICD-10-CM

## 2017-08-08 DIAGNOSIS — N186 End stage renal disease: Secondary | ICD-10-CM

## 2017-08-08 LAB — COMPREHENSIVE METABOLIC PANEL
ALK PHOS: 98 U/L (ref 38–126)
ALT: 9 U/L — ABNORMAL LOW (ref 17–63)
ANION GAP: 10 (ref 5–15)
AST: 40 U/L (ref 15–41)
Albumin: 2.3 g/dL — ABNORMAL LOW (ref 3.5–5.0)
BUN: 47 mg/dL — ABNORMAL HIGH (ref 6–20)
CALCIUM: 5.9 mg/dL — AB (ref 8.9–10.3)
CO2: 23 mmol/L (ref 22–32)
Chloride: 107 mmol/L (ref 101–111)
Creatinine, Ser: 6.24 mg/dL — ABNORMAL HIGH (ref 0.61–1.24)
GFR calc non Af Amer: 9 mL/min — ABNORMAL LOW (ref >60)
GFR, EST AFRICAN AMERICAN: 10 mL/min — AB (ref >60)
Glucose, Bld: 114 mg/dL — ABNORMAL HIGH (ref 65–99)
Potassium: 3.8 mmol/L (ref 3.5–5.1)
SODIUM: 140 mmol/L (ref 135–145)
TOTAL PROTEIN: 5.8 g/dL — AB (ref 6.5–8.1)
Total Bilirubin: 1 mg/dL (ref 0.3–1.2)

## 2017-08-08 LAB — CBC
HCT: 32.1 % — ABNORMAL LOW (ref 39.0–52.0)
HEMOGLOBIN: 10.2 g/dL — AB (ref 13.0–17.0)
MCH: 34.2 pg — AB (ref 26.0–34.0)
MCHC: 31.8 g/dL (ref 30.0–36.0)
MCV: 107.7 fL — ABNORMAL HIGH (ref 78.0–100.0)
Platelets: 69 10*3/uL — ABNORMAL LOW (ref 150–400)
RBC: 2.98 MIL/uL — ABNORMAL LOW (ref 4.22–5.81)
RDW: 18.5 % — AB (ref 11.5–15.5)
WBC: 9.8 10*3/uL (ref 4.0–10.5)

## 2017-08-08 LAB — GLUCOSE, CAPILLARY: GLUCOSE-CAPILLARY: 126 mg/dL — AB (ref 65–99)

## 2017-08-08 LAB — PROTIME-INR
INR: 2.23
PROTHROMBIN TIME: 24.6 s — AB (ref 11.4–15.2)

## 2017-08-08 LAB — CORTISOL-AM, BLOOD: CORTISOL - AM: 15 ug/dL (ref 6.7–22.6)

## 2017-08-08 LAB — MAGNESIUM: Magnesium: 1.7 mg/dL (ref 1.7–2.4)

## 2017-08-08 MED ORDER — WARFARIN SODIUM 3 MG PO TABS
3.0000 mg | ORAL_TABLET | Freq: Once | ORAL | Status: DC
  Filled 2017-08-08: qty 1

## 2017-08-08 MED ORDER — AMIODARONE HCL IN DEXTROSE 360-4.14 MG/200ML-% IV SOLN: 60.0000 mg/h | INTRAVENOUS | Status: DC

## 2017-08-08 MED ORDER — HYDROCORTISONE NA SUCCINATE PF 100 MG IJ SOLR: 50.0000 mg | Freq: Four times a day (QID) | INTRAMUSCULAR | Status: DC

## 2017-08-08 MED ORDER — WARFARIN SODIUM 2.5 MG PO TABS
3.7500 mg | ORAL_TABLET | Freq: Once | ORAL | Status: AC
  Administered 2017-08-08: 3.75 mg via ORAL
  Filled 2017-08-08: qty 1

## 2017-08-08 MED ORDER — SODIUM CHLORIDE 0.9 % IV BOLUS (SEPSIS)
500.0000 mL | Freq: Once | INTRAVENOUS | Status: AC
Start: 2017-08-08 — End: 2017-08-08
  Administered 2017-08-08: 500 mL via INTRAVENOUS

## 2017-08-08 MED ORDER — HYDROCORTISONE NA SUCCINATE PF 100 MG IJ SOLR
50.0000 mg | Freq: Four times a day (QID) | INTRAMUSCULAR | Status: DC
  Administered 2017-08-08 – 2017-08-10 (×7): 50 mg via INTRAVENOUS
  Filled 2017-08-08 (×8): qty 1

## 2017-08-08 MED ORDER — AMIODARONE HCL IN DEXTROSE 360-4.14 MG/200ML-% IV SOLN: 30.0000 mg/h | INTRAVENOUS | Status: DC

## 2017-08-08 MED ORDER — HYDROCORTISONE NA SUCCINATE PF 100 MG IJ SOLR
100.0000 mg | Freq: Once | INTRAMUSCULAR | Status: AC
  Administered 2017-08-08: 100 mg via INTRAVENOUS
  Filled 2017-08-08: qty 2

## 2017-08-08 MED ORDER — SODIUM CHLORIDE 0.9 % IV SOLN
2.0000 g | Freq: Once | INTRAVENOUS | Status: AC
  Administered 2017-08-08: 2 g via INTRAVENOUS
  Filled 2017-08-08: qty 20

## 2017-08-08 MED ORDER — AMIODARONE HCL 200 MG PO TABS
400.0000 mg | ORAL_TABLET | Freq: Two times a day (BID) | ORAL | Status: DC
  Administered 2017-08-08 – 2017-08-11 (×8): 400 mg via ORAL
  Filled 2017-08-08 (×8): qty 2

## 2017-08-08 MED ORDER — SODIUM CHLORIDE 0.9 % IV SOLN
0.0000 ug/min | INTRAVENOUS | Status: DC
  Administered 2017-08-08: 70 ug/min via INTRAVENOUS
  Administered 2017-08-08: 80 ug/min via INTRAVENOUS
  Administered 2017-08-08 (×2): 260 ug/min via INTRAVENOUS
  Administered 2017-08-08: 200 ug/min via INTRAVENOUS
  Administered 2017-08-08: 100 ug/min via INTRAVENOUS
  Administered 2017-08-08: 70 ug/min via INTRAVENOUS
  Administered 2017-08-08: 200 ug/min via INTRAVENOUS
  Administered 2017-08-08: 220 ug/min via INTRAVENOUS
  Administered 2017-08-08: 20 ug/min via INTRAVENOUS
  Administered 2017-08-08: 250 ug/min via INTRAVENOUS
  Administered 2017-08-08: 200 ug/min via INTRAVENOUS
  Administered 2017-08-08: 130 ug/min via INTRAVENOUS
  Administered 2017-08-09: 40 ug/min via INTRAVENOUS
  Administered 2017-08-09 (×5): 70 ug/min via INTRAVENOUS
  Filled 2017-08-08 (×3): qty 1
  Filled 2017-08-08: qty 10
  Filled 2017-08-08: qty 1
  Filled 2017-08-08: qty 10
  Filled 2017-08-08: qty 1
  Filled 2017-08-08: qty 10
  Filled 2017-08-08 (×3): qty 1
  Filled 2017-08-08: qty 10
  Filled 2017-08-08 (×2): qty 1
  Filled 2017-08-08 (×2): qty 10
  Filled 2017-08-08 (×2): qty 1

## 2017-08-08 MED ORDER — MAGNESIUM SULFATE 2 GM/50ML IV SOLN
2.0000 g | Freq: Once | INTRAVENOUS | Status: AC
  Administered 2017-08-08: 2 g via INTRAVENOUS
  Filled 2017-08-08: qty 50

## 2017-08-08 NOTE — Progress Notes (Signed)
ANTICOAGULATION CONSULT NOTE - Follow-up Consult  Pharmacy Consult for Warfarin Indication: atrial fibrillation  Labs: Recent Labs    08/06/17 0504 08/07/17 0403 08/08/17 0325  HGB 11.7* 11.5* 10.2*  HCT 35.6* 35.3* 32.1*  PLT 75* 82* 69*  LABPROT 19.4* 19.9* 24.6*  INR 1.65 1.70 2.23  CREATININE  --   --  6.24*   Assessment: [name] here with dizziness and SOB. Pharmacy consulted to resume home warfarin therapy for h/o AFib. INR on admission is supratherapeutic at 3.26.  INR now therapeutic. Hgb 10.2 and PLTc remains chronically low.   New drug interaction: amiodarone 400 mg BID   Home warfarin dose: 7.5 mg on Mon; 3.75 mg on all other days.   Goal of Therapy:  INR 2-3 Monitor platelets by anticoagulation protocol: Yes   Plan:  1. Decrease warfarin to 3.75 mg x 1 this evening   2. Daily INR, CBC  [name], PharmD., BCPS Clinical Pharmacist Pager [phone]

## 2017-08-08 NOTE — Progress Notes (Addendum)
[facility] Progress Note   Assessment/ Plan:   1. Hypotension - acute on chronic. BP's have improved some since admission. On midodrine 10 tid. Severe RHF on echo this admit. No specific Rx per cardiology.  NSVT during this admission.  Patient has severe cardiac disease (afib/ uncont HR/ failed ablation/ severe RHF/ pulm HTN/ NSVT, etc), I told him that from here forward any hemodialysis procedure for him would be considered high-risk and that we could continue to try to dialyze him but w/ the condition of DNR status. Also gave him the option of hospice. Discussed all this w/ the wife present.  He declined hospice, agreed to DNR status and continued HD.   Also he is interested in seeing if the surgeons can "tighten up" the fistula, if that would help any. Have d/w primary team.  2. Volume- up 3 kg by standing wts, no resp issue, sig dependent edema 3. ESRD - HD MWF.  HD orders for today but hemodynamics may be limiting.  Will see if amio improves HR at all and if safe will dialyze.  He is on a sig amount of neosynephrine today. ADDENDUM 1620 pm: has been able to wean pressor throughout the day.  Will defer orders until tomorrow first shift and will decide if CRRT vs IHD feasible. 4. COPD/ pulm HTN- on home O2 5. Aflutter / fib -sinus tach on monitor hx failed ablation, have decided to forward with amiodarone.  Will see if this improves HR.   6. Cellulitis L arm-at AVF wound site. On empiric abx. 7. Anemia of CKD - no esa needed, Hgb 11.7 8. MBD - cont meds 9. CAD hx CABG/ DES 10. Nutrition - add multivit alb 2.9     Subjective:    Hemodynamics still unfavorable.     Objective:   BP (!) 89/62 (BP Location: Right Arm)   Pulse (!) 144   Temp 98.6 F (37 C) (Oral)   Resp 16   Ht 5\' 10"  (1.778 m)   Wt 83.8 kg (184 lb 11.9 oz)   SpO2 96%   BMI 26.51 kg/m   Physical Exam: General: NAD talkative.   Heart:tachy r Lungs: grossly clear Abdomen:soft NT Extremities:  SCDs and compression stockins ^^ dependent edema in thighs/buttocks Dialysis Access: right IJ and left AVF + bruit with erythema crusted incision   Labs: BMET Recent Labs  Lab 08/02/17 0326 08/03/17 0902 08/05/17 0437 08/08/17 0325  NA 137 141 137 140  K 5.2* 4.0 3.8 3.8  CL 103 104 102 107  CO2 21* 25 23 23   GLUCOSE 107* 79 91 114*  BUN 50* 31* 32* 47*  CREATININE 7.25* 5.22* 5.53* 6.24*  CALCIUM 6.4* 7.2* 8.2* 5.9*  PHOS 5.8* 4.3 3.9  --    CBC Recent Labs  Lab 08/03/17 0902 08/06/17 0504 08/07/17 0403 08/08/17 0325  WBC 7.0 6.5 6.6 9.8  HGB 11.7* 11.7* 11.5* 10.2*  HCT 36.9* 35.6* 35.3* 32.1*  MCV 107.3* 108.5* 110.0* 107.7*  PLT 87* 75* 82* 69*    @IMGRELPRIORS @ Medications:    . amiodarone  400 mg Oral BID  . arformoterol  15 mcg Nebulization BID  . atorvastatin  20 mg Oral QHS  . citalopram  20 mg Oral QHS  . midodrine  10 mg Oral TID  . multivitamin  1 tablet Oral QHS  . pantoprazole  40 mg Oral BID  . umeclidinium bromide  1 puff Inhalation Daily  . Warfarin - Pharmacist Dosing Inpatient  Does not apply q1800     Madelon Lips, MD [facility] pgr [phone] 08/08/2017, 9:17 AM

## 2017-08-08 NOTE — Progress Notes (Addendum)
NIGHT TEAM PROGRESS NOTE  Notified of pt having runs of potential prolonged Vtach on monitor. On review of tele, he appears to be having increased bigeminy with intermittent runs of potential vtach. Pt continues to report being asymptomatic. Case discussed with cardiology fellow on call, who reviewed the case and rhythm strip-felt it was likely a-fib with aberrancy and recommended considering amiodarone gtt to decrease arrhythmias, may also have effect on BP but may not be as significant as other agents. Toxicities have been a concern with underlying lung disease. Frequency of ectopy and wide complex tachycardia decreased while on floor evaluating pt. For now, will continue to monitor closely and if pt becomes more symptomatic or runs become more frequent will consider initiating amio.

## 2017-08-08 NOTE — Consult Note (Signed)
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 fib status post ablation who is been in the floor persistent hypotension he was seen by palliative and made DNR had episodes of tachycardia overnight where he was thought to be in A. fib by the time I saw him he was just in sinus tach heart rate is around 120-140s mostly in the 122 he has been hypotensive more than the usually runs his blood pressure usually in the 80/40 but lately he has been down to the 60s 70s with map 50s patient is asymptomatic he has no chest pain fever chills rigors nausea vomiting diarrhea loss of consciousness. PAST MEDICAL HISTORY :  He  has a past medical history of Acute blood loss anemia, Anemia, Anxiety, Aortic heart valve prolapse (04/2013), Bifascicular block, CAD (coronary artery disease) (04/2013), Cancer ([place]), Carotid artery disease ([place]), CHF (congestive heart failure) ([place]), Chronic respiratory failure ([place]), COPD (chronic obstructive pulmonary disease) ([place]), Dyspnea, ESRD on hemodialysis (02/12/2012), GERD (gastroesophageal reflux disease), Hematoma, Hypertension, Multiple sclerosis ([place]), Myocardial infarction ([place]), Nocturnal hypoxemia, On home oxygen therapy, PAF (paroxysmal atrial fibrillation) ([place]), Paroxysmal atrial flutter ([place]), Peripheral vascular disease ([place]), Pneumonia, Renal transplant failure and rejection, 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  PAST SURGICAL HISTORY: He  has a past surgical history that includes AV fistula placement; flash; Kidney transplant (08/2007); excised squamous cells at rectum (2006); left heart catheterization with coronary angiogram (N/A, 01/09/2013); Cardiac catheterization (N/A, 05/26/2015); Cardiac catheterization (N/A, 05/26/2015); Cardiac catheterization (N/A, 02/15/2016); Coronary artery bypass graft; Cardioversion (N/A, 07/12/2016); Cardioversion (N/A, 11/11/2016); TEE without cardioversion (N/A, 11/11/2016); Knee surgery; TEE without cardioversion (N/A, 12/09/2016); Cardioversion (N/A, 12/09/2016); Coronary angioplasty; Parathyroidectomy; Hernia repair (07/2011); Appendectomy; Colonoscopy; Esophagogastroduodenoscopy endoscopy; and AV fistula placement (Left, 07/19/2017).  Allergies  Allergen Reactions  . Adhesive [Tape] Rash    Please use paper tape  . Amoxicillin Rash    migraine  . Ativan [Lorazepam] Anxiety  . Bupropion Anxiety  . Diphenhydramine Itching and Anxiety    Only with IV doses. Tolerates oral.  . Penicillins Other (See Comments)    MIGRAINE  Has patient had a PCN reaction causing immediate rash, facial/tongue/throat swelling, SOB or lightheadedness with hypotension: no Has patient had a PCN reaction causing severe rash involving mucus membranes or skin necrosis: No Has patient had a PCN reaction that required hospitalization No Has patient had a PCN reaction occurring within the last 10 years: No If all of the above answers are "NO", then may proceed with Cephalosporin use.    No current facility-administered medications on file prior to encounter.    Current Outpatient Medications on File Prior to Encounter  Medication Sig  . acetaminophen (TYLENOL) 500 MG tablet Take 1,000 mg every 6 (six) hours as needed by mouth for mild pain.   Marland Kitchen albuterol (ACCUNEB) 1.25 MG/3ML nebulizer solution Take 3 mLs (1.25 mg total) by nebulization  every 6 (six) hours as needed for wheezing.  Marland Kitchen albuterol (PROAIR HFA)  108 (90 Base) MCG/ACT inhaler Inhale 2 puffs every 6 (six) hours as needed into the lungs for wheezing or shortness of breath.  Marland Kitchen atorvastatin (LIPITOR) 40 MG tablet Take 0.5 tablets (20 mg total) by mouth at bedtime.  . calcium acetate (PHOSLO) 667 MG capsule Take 4,002 mg by mouth 3 (three) times daily with meals.   . citalopram (CELEXA) 20 MG tablet Take 20 mg by mouth at bedtime.   . diphenhydrAMINE (BENADRYL) 2 % cream Apply 1 application topically daily as needed for itching.  . docusate sodium (COLACE) 100 MG capsule Take 100 mg by mouth 2 (two) times daily.   . folic acid (FOLVITE) 1 MG tablet Take 1 mg by mouth daily.   Marland Kitchen gabapentin (NEURONTIN) 300 MG capsule Take 300 mg by mouth at bedtime. Patient taking two 300mg  capsules at bedtime sometime.  . metoprolol tartrate (LOPRESSOR) 25 MG tablet Take 12.5 mg by mouth 2 (two) times daily.  . midodrine (PROAMATINE) 10 MG tablet Take 10 mg by mouth 3 (three) times daily.  . multivitamin (RENA-VIT) TABS tablet Take 1 tablet by mouth daily.  . OXYGEN 2lpm with sleep and "at home"  . oxymetazoline (AFRIN) 0.05 % nasal spray Place 1 spray into both nostrils 2 (two) times daily as needed for congestion.  . pantoprazole (PROTONIX) 40 MG tablet Take 1 tablet (40 mg total) by mouth 2 (two) times daily.  . polyethylene glycol (MIRALAX / GLYCOLAX) packet Take 17 g daily as needed by mouth for mild constipation. Mix in 8 oz liquid and drink   . STIOLTO RESPIMAT 2.5-2.5 MCG/ACT AERS INHALE 2 PUFFS BY MOUTH DAILY  . warfarin (COUMADIN) 7.5 MG tablet Take 0.5-1 tablets (3.75-7.5 mg total) See admin instructions by mouth. (Patient taking differently: Take 3.75-7.5 mg by mouth See admin instructions. On Monday (only) he takes one tablet 7.5mg .)  . nitroGLYCERIN (NITROSTAT) 0.4 MG SL tablet Place 0.4 mg under the tongue every 5 (five) minutes as needed for chest pain (max 3 doses - if no  relief call MD).   Marland Kitchen oxyCODONE (ROXICODONE) 5 MG immediate release tablet Take 1 tablet (5 mg total) every 6 (six) hours as needed by mouth.    FAMILY HISTORY:  His indicated that his mother is deceased. He indicated that his father is deceased. He indicated that the status of his sister is unknown. He indicated that the status of his maternal grandfather is unknown. He indicated that the status of his maternal uncle is unknown. He indicated that the status of his neg hx is unknown.   SOCIAL HISTORY: He  reports that he quit smoking about 19 years ago. His smoking use included cigarettes. He has a 40.00 pack-year smoking history. he has never used smokeless tobacco. He reports that he does not drink alcohol or use drugs.  REVIEW OF SYSTEMS:   14 points reviewed with mentioned HPI.  SUBJECTIVE:  Asymptomatic.  VITAL SIGNS: BP (!) 65/53   Pulse (!) 122   Temp 100 F (37.8 C) (Oral)   Resp 18   Ht 5\' 10"  (1.778 m)   Wt 222 lb 3.6 oz (100.8 kg)   SpO2 93%   BMI 31.89 kg/m   HEMODYNAMICS:    VENTILATOR SETTINGS:    INTAKE / OUTPUT: I/O last 3 completed shifts: In: 27 [P.O.:530] Out: -   PHYSICAL EXAMINATION: General:  NAD , pleasant and hungry , wants to eat . Neuro:  WNL , AOX3 , EOMI , CN II-XII intact , UL ,  LL strength is symmetrical and 5/5 HEENT:  atraumatic , no jaundice , dry mucous membranes  Cardiovascular:  Irregular irregular , ESM 2/6 in the aortic area  Lungs:  CTA bilateral , no wheezing or crackles  Abdomen:  Soft lax +BS , no tenderness . Musculoskeletal:  WNL , normal pulses  Skin:  No rash    LABS:  BMET Recent Labs  Lab 08/02/17 0326 08/03/17 0902 08/05/17 0437  NA 137 141 137  K 5.2* 4.0 3.8  CL 103 104 102  CO2 21* 25 23  BUN 50* 31* 32*  CREATININE 7.25* 5.22* 5.53*  GLUCOSE 107* 79 91    Electrolytes Recent Labs  Lab 08/02/17 0326 08/03/17 0902 08/05/17 0437  CALCIUM 6.4* 7.2* 8.2*  PHOS 5.8* 4.3 3.9    CBC Recent Labs   Lab 08/06/17 0504 08/07/17 0403 08/08/17 0325  WBC 6.5 6.6 9.8  HGB 11.7* 11.5* 10.2*  HCT 35.6* 35.3* 32.1*  PLT 75* 82* 69*    Coag's Recent Labs  Lab 08/06/17 0504 08/07/17 0403 08/08/17 0325  INR 1.65 1.70 2.23    Sepsis Markers Recent Labs  Lab 08/01/17 1011 08/01/17 1214 08/03/17 0902 08/03/17 1617 08/04/17 0442 08/05/17 0437  LATICACIDVEN 1.99* 1.78  --  1.8  --   --   PROCALCITON  --   --  0.56  --  0.69 0.59    ABG No results for input(s): PHART, PCO2ART, PO2ART in the last 168 hours.  Liver Enzymes Recent Labs  Lab 08/02/17 0326 08/03/17 0902 08/05/17 0437  ALBUMIN 2.7* 2.7* 2.9*    Cardiac Enzymes Recent Labs  Lab 08/01/17 1656 08/01/17 2139 08/02/17 0326  TROPONINI 0.07* 0.08* 0.08*    Glucose Recent Labs  Lab 08/06/17 1215 08/06/17 1234  GLUCAP 104* 111*    Imaging Dg Chest Port 1 View  Result Date: 08/08/2017 CLINICAL DATA:  61 year old male with shortness of breath. EXAM: PORTABLE CHEST 1 VIEW COMPARISON:  Chest radiograph dated 08/01/2017 FINDINGS: Right IJ dialysis catheter in similar position. There is stable cardiomegaly. Median sternotomy wires, mechanical heart valve, and CABG vascular clips noted. Coronary vascular stent. There is atherosclerotic calcification of the thoracic aorta. There is diffuse interstitial coarsening with bibasilar atelectasis/ scarring. There is blunting of the costophrenic angles suspicious for small pleural effusion. A focal ovoid area of increased density in the right mid lung field may represent small fluid within the fissure. There is no focal consolidation or pneumothorax. Diffuse interstitial coarsening. No acute osseous pathology. IMPRESSION: Probable small bilateral pleural effusions and bibasilar atelectasis/ scarring. No definite focal consolidation. Stable cardiomegaly. Electronically Signed   By: Anner Crete M.D.   On: 08/08/2017 03:29       ASSESSMENT / PLAN:  PULMONARY No  active issues at this point patient chest x-ray showed that he has chronic scars a decision was made to give him amiodarone but because of lung toxicities in the past that was withheld and he was the patient is not an clear need of amiodarone at this point.  CARDIOVASCULAR Persistent hypotension on midodrine at this point will hold all hypertensive medication he is on as needed metoprolol will go ahead and DC the he had an echo recently with EF of 50-55% we will go ahead and give him a bolus of normal saline he is due for dialysis today on Monday he is on dialysis 3 times a week the only consideration I have at this point does have limited range is to go ahead and  order a.m. cortisol level at 8 in the morning and if that is slow to start him on stress dose steroids. Patient will go ahead and we will start him on Neo-Synephrine for blood pressure support but he would not be able to get regular IHD with this blood pressure I think going forward this might be a problem and further discussions with palliative might help the outcome in regards of clarifying goals of care RENAL Patient had end-stage renal disease and is hemodialysis for 18 years had vascular problem access problem and was seen by vascular I think there is a consideration of ligating all of his AV fistulas as this is causing shunting and decrease in the blood pressure but he is on discussions with vascular about that.  I do not know if nephrology will be able to dialyze him with this blood pressure but hopefully he will respond to Neo-Synephrine and if he needs hydrocortisone that will improve his blood pressure as well. Patient has end-stage renal disease end-stage hemodialysis at this point not sure he will be able to tolerate any more hemodialysis for right now we will go ahead and prescribe the patient Neo-Synephrine to improve his blood pressure totally further discussions happen with nephrology and palliative with this blood pressure patient  will not be able to get dialysis.  GASTROINTESTINAL No active issues  HEMATOLOGIC A:   *No active issues  INFECTIOUS A:   No indication for antibiotics ENDOCRINE Check a.m. cortisol level see if the patient is adrenal insufficient.  NEUROLOGIC Patient is mentating fine and he is DNR.     Critical care time 50 minutes.  Pulmonary and [place] Pager: [phone]  08/08/2017, 4:03 AM

## 2017-08-08 NOTE — Progress Notes (Signed)
Occupational Therapy Treatment Patient Details Name: [name] MRN: [id number] DOB: 04-25-1956 Today's Date: 08/08/2017    History of present illness Patient is a 61 year old male with a past medical history of CAD status post two-vessel CABG in 2014, CHF, COPD on home O2, multiple sclerosis, squamous cell carcinoma of the lung status post radiation therapy, peripheral vascular disease and chronic hypotension. New left brachial AV fistula placed approximately 3 weeks ago and has been complaining of some lightheadedness since the procedure when standing and during ambulation.   OT comments  Pt making progress towards goals this session. Pt able to ambulate to sink, perform standing grooming tasks, and return to bed. HR initially 120's increased to 140-150's with transfers and activity, and returned to 120's in supine at EOS. O2 monitored throughout. OT will continue to follow in acute setting, discharge recommendations remain appropriate at this time, will continue to monitor for activity tolerance to see if further therapy is needed.   Follow Up Recommendations  No OT follow up;Supervision/Assistance - 24 hour    Equipment Recommendations  None recommended by OT(Pt has appropriate DME)    Recommendations for Other Services      Precautions / Restrictions Precautions Precautions: Fall Precaution Comments: watch HR, BP Restrictions Weight Bearing Restrictions: No       Mobility Bed Mobility Overal bed mobility: Needs Assistance Bed Mobility: Supine to Sit;Sit to Supine     Supine to sit: Supervision Sit to supine: Supervision   General bed mobility comments: no physical assist needed,   Transfers Overall transfer level: Needs assistance Equipment used: 1 person hand held assist Transfers: Sit to/from Stand Sit to Stand: Min assist         General transfer comment: Pt min A for balance and slight power up    Balance Overall balance assessment: Needs  assistance Sitting-balance support: Single extremity supported;Feet supported Sitting balance-Leahy Scale: Fair     Standing balance support: Single extremity supported;During functional activity Standing balance-Leahy Scale: Poor Standing balance comment: requires support from 1 HHA or leans against sink                           ADL either performed or assessed with clinical judgement   ADL Overall ADL's : Needs assistance/impaired     Grooming: Wash/dry hands;Wash/dry face;Oral care;Standing;Min guard Grooming Details (indicate cue type and reason): sink level this session, requires BUE for oral care due to tremor (baseline) due to MS             Lower Body Dressing: Set up;Bed level Lower Body Dressing Details (indicate cue type and reason): to don socks Toilet Transfer: Minimal assistance;Ambulation Toilet Transfer Details (indicate cue type and reason): 1 person HHA for balance during moblity for transfer         Functional mobility during ADLs: Minimal assistance(1 person HHA)       Vision       Perception     Praxis      Cognition Arousal/Alertness: Awake/alert Behavior During Therapy: WFL for tasks assessed/performed Overall Cognitive Status: Within Functional Limits for tasks assessed                                 General Comments: sleepy, but very easily arousable and participates well in therapy        Exercises     Shoulder Instructions  General Comments Pt's HR hovering around 122 when therapy initiated. HR closely monitored throughout with short burst of 160, returned to mid 120's with return to bed.  [place] 4L of O2 throughout session with inconsistent reading on monitor, but no SOB noted and Pt able to count to 10 with reasonable breath pattern.     Pertinent Vitals/ Pain       Pain Assessment: No/denies pain  Home Living Family/patient expects to be discharged to:: Private residence                                         Prior Functioning/Environment              Frequency  Min 3X/week        Progress Toward Goals  OT Goals(current goals can now be found in the care plan section)  Progress towards OT goals: Progressing toward goals  Acute Rehab OT Goals Patient Stated Goal: Play golf again OT Goal Formulation: With patient/family Time For Goal Achievement: 08/18/17 Potential to Achieve Goals: Good  Plan Discharge plan remains appropriate;Frequency remains appropriate    Co-evaluation    PT/OT/SLP Co-Evaluation/Treatment: Yes Reason for Co-Treatment: Complexity of the patient's impairments (multi-system involvement);For patient/therapist safety;To address functional/ADL transfers PT goals addressed during session: Mobility/safety with mobility OT goals addressed during session: ADL's and self-care      AM-PAC PT "6 Clicks" Daily Activity     Outcome Measure   Help from another person eating meals?: None Help from another person taking care of personal grooming?: A Little Help from another person toileting, which includes using toliet, bedpan, or urinal?: A Little Help from another person bathing (including washing, rinsing, drying)?: A Little Help from another person to put on and taking off regular upper body clothing?: None Help from another person to put on and taking off regular lower body clothing?: A Little 6 Click Score: 20    End of Session Equipment Utilized During Treatment: Gait belt;Oxygen(4L)  OT Visit Diagnosis: Unsteadiness on feet (R26.81);Other abnormalities of gait and mobility (R26.89);Muscle weakness (generalized) (M62.81)   Activity Tolerance Patient tolerated treatment well   Patient Left in bed;with call bell/phone within reach   Nurse Communication Mobility status        Time: 1779-3903 OT Time Calculation (min): 25 min  Charges: OT General Charges $OT Visit: 1 Visit OT Treatments $Self Care/Home Management :  8-22 mins  Hulda Humphrey OTR/L [place] 08/08/2017, 2:25 PM

## 2017-08-08 NOTE — Progress Notes (Signed)
Physical Therapy Treatment Patient Details Name: [name] MRN: [id number] DOB: Jun 29, 1956 Today's Date: 08/08/2017    History of Present Illness Patient is a 61 year old male with a past medical history of CAD status post two-vessel CABG in 2014, CHF, COPD on home O2, multiple sclerosis, squamous cell carcinoma of the lung status post radiation therapy, peripheral vascular disease and chronic hypotension. New left brachial AV fistula placed approximately 3 weeks ago and has been complaining of some lightheadedness since the procedure when standing and during ambulation.    PT Comments    Patient seen for activity progression. Tolerated EOB activity well and was able to perform some in room ambulation and functional task performance at sink with standing balance. Patient with moderate fatigue and HR did elevate to 160s limited further mobility at this time. Will continue to see and progress as tolerated. Current POC remains appropriate.   Follow Up Recommendations  Outpatient PT;Supervision for mobility/OOB     Equipment Recommendations  None recommended by PT    Recommendations for Other Services       Precautions / Restrictions Precautions Precautions: Fall Precaution Comments: watch HR, BP Restrictions Weight Bearing Restrictions: No    Mobility  Bed Mobility Overal bed mobility: Needs Assistance Bed Mobility: Supine to Sit;Sit to Supine     Supine to sit: Supervision Sit to supine: Supervision   General bed mobility comments: no physical assist needed,   Transfers Overall transfer level: Needs assistance Equipment used: 1 person hand held assist Transfers: Sit to/from Stand Sit to Stand: Min assist         General transfer comment: Pt min A for balance and slight power up  Ambulation/Gait Ambulation/Gait assistance: Min assist Ambulation Distance (Feet): 12 Feet Assistive device: 1 person hand held assist Gait Pattern/deviations: Step-through  pattern;Decreased stride length;Shuffle;Narrow base of support Gait velocity: decreased   General Gait Details: patient with noted instability during short ambulation to and from sink. HR elevated to 160s with activity patient with moderate fatigue.    Stairs            Wheelchair Mobility    Modified Rankin (Stroke Patients Only)       Balance Overall balance assessment: Needs assistance Sitting-balance support: Single extremity supported;Feet supported Sitting balance-Leahy Scale: Fair     Standing balance support: Single extremity supported;During functional activity Standing balance-Leahy Scale: Poor Standing balance comment: requires support from 1 HHA or leans against sink                            Cognition Arousal/Alertness: Awake/alert Behavior During Therapy: WFL for tasks assessed/performed Overall Cognitive Status: Within Functional Limits for tasks assessed                                 General Comments: sleepy, but very easily arousable and participates well in therapy      Exercises      General Comments General comments (skin integrity, edema, etc.): Pt's HR hovering around 122 when therapy initiated. HR closely monitored throughout with short burst of 160, returned to mid 120's with return to bed.  [place] 4L of O2 throughout session with inconsistent reading on monitor, but no SOB noted and Pt able to count to 10 with reasonable breath pattern.       Pertinent Vitals/Pain Pain Assessment: No/denies pain    Home Living Family/patient expects to be  discharged to:: Private residence                    Prior Function            PT Goals (current goals can now be found in the care plan section) Acute Rehab PT Goals Patient Stated Goal: Play golf again PT Goal Formulation: With patient/family Time For Goal Achievement: 08/11/17 Potential to Achieve Goals: Good Progress towards PT goals: Progressing toward  goals    Frequency    Min 3X/week      PT Plan Current plan remains appropriate    Co-evaluation PT/OT/SLP Co-Evaluation/Treatment: Yes Reason for Co-Treatment: Complexity of the patient's impairments (multi-system involvement);For patient/therapist safety;To address functional/ADL transfers PT goals addressed during session: Mobility/safety with mobility OT goals addressed during session: ADL's and self-care      AM-PAC PT "6 Clicks" Daily Activity  Outcome Measure  Difficulty turning over in bed (including adjusting bedclothes, sheets and blankets)?: None Difficulty moving from lying on back to sitting on the side of the bed? : None Difficulty sitting down on and standing up from a chair with arms (e.g., wheelchair, bedside commode, etc,.)?: None Help needed moving to and from a bed to chair (including a wheelchair)?: A Little Help needed walking in hospital room?: A Little Help needed climbing 3-5 steps with a railing? : A Lot 6 Click Score: 20    End of Session Equipment Utilized During Treatment: Oxygen Activity Tolerance: Patient tolerated treatment well Patient left: in bed;with call bell/phone within reach;with family/visitor present Nurse Communication: Mobility status PT Visit Diagnosis: Other symptoms and signs involving the nervous system (R29.898);Muscle weakness (generalized) (M62.81)     Time: 7793-9030 PT Time Calculation (min) (ACUTE ONLY): 24 min  Charges:  $Therapeutic Activity: 8-22 mins                    G Codes:       Alben Deeds, PT DPT  Board Certified Neurologic Specialist [place] 08/08/2017, 3:06 PM

## 2017-08-08 NOTE — Progress Notes (Signed)
Progress Note  Patient Name: Granite Godman Date of Encounter: 08/08/2017  Primary Cardiologist: Dr Rayann Heman  Subjective   Denies CP or dyspnea  Inpatient Medications    Scheduled Meds: . arformoterol  15 mcg Nebulization BID  . atorvastatin  20 mg Oral QHS  . citalopram  20 mg Oral QHS  . midodrine  10 mg Oral TID  . multivitamin  1 tablet Oral QHS  . pantoprazole  40 mg Oral BID  . umeclidinium bromide  1 puff Inhalation Daily  . Warfarin - Pharmacist Dosing Inpatient   Does not apply q1800   Continuous Infusions: . ferric gluconate (FERRLECIT/NULECIT) IV Stopped (08/03/17 1235)  . phenylephrine (NEO-SYNEPHRINE) Adult infusion 260 mcg/min (08/08/17 0733)   PRN Meds: acetaminophen **OR** acetaminophen, albuterol   Vital Signs    Vitals:   08/08/17 0545 08/08/17 0600 08/08/17 0615 08/08/17 0730  BP: (!) 88/68 (!) 89/49 93/68   Pulse: (!) 122 (!) 144    Resp: 17 19 18    Temp:      TempSrc:      SpO2: 94% 91%  96%  Weight:      Height:        Intake/Output Summary (Last 24 hours) at 08/08/2017 0742 Last data filed at 08/08/2017 0600 Gross per 24 hour  Intake 540 ml  Output -  Net 540 ml   Filed Weights   08/07/17 0403 08/07/17 0406 08/08/17 0420  Weight: 186 lb 11.7 oz (84.7 kg) 222 lb 3.6 oz (100.8 kg) 184 lb 11.9 oz (83.8 kg)    Telemetry    Atrial fibrillation/flutter with RVR; transient WCT felt possibly related to aberrancy - Personally Reviewed   Physical Exam   GEN: WD, chronically ill appearing. No acute distress.   Neck: Supple Cardiac: irregular, tachycardic, 3/6 systolic murmur Respiratory: Mild rhonchi GI: Soft, diffuse abdominal wall edema MS: chronic skin changes, 2+ thigh edema Neuro:  Nonfocal    Labs    Chemistry Recent Labs  Lab 08/03/17 0902 08/05/17 0437 08/08/17 0325  NA 141 137 140  K 4.0 3.8 3.8  CL 104 102 107  CO2 25 23 23   GLUCOSE 79 91 114*  BUN 31* 32* 47*  CREATININE 5.22* 5.53* 6.24*  CALCIUM 7.2* 8.2*  5.9*  PROT  --   --  5.8*  ALBUMIN 2.7* 2.9* 2.3*  AST  --   --  40  ALT  --   --  9*  ALKPHOS  --   --  98  BILITOT  --   --  1.0  GFRNONAA 11* 10* 9*  GFRAA 12* 12* 10*  ANIONGAP 12 12 10      Hematology Recent Labs  Lab 08/06/17 0504 08/07/17 0403 08/08/17 0325  WBC 6.5 6.6 9.8  RBC 3.28* 3.21* 2.98*  HGB 11.7* 11.5* 10.2*  HCT 35.6* 35.3* 32.1*  MCV 108.5* 110.0* 107.7*  MCH 35.7* 35.8* 34.2*  MCHC 32.9 32.6 31.8  RDW 19.3* 19.4* 18.5*  PLT 75* 82* 69*    Cardiac Enzymes Recent Labs  Lab 08/01/17 1656 08/01/17 2139 08/02/17 0326  TROPONINI 0.07* 0.08* 0.08*    Recent Labs  Lab 08/01/17 0813  TROPIPOC 0.05     BNP Recent Labs  Lab 08/01/17 1002  BNP 1,426.5*      Radiology    Dg Chest Port 1 View  Result Date: 08/08/2017 CLINICAL DATA:  61 year old male with shortness of breath. EXAM: PORTABLE CHEST 1 VIEW COMPARISON:  Chest radiograph dated 08/01/2017 FINDINGS: Right  IJ dialysis catheter in similar position. There is stable cardiomegaly. Median sternotomy wires, mechanical heart valve, and CABG vascular clips noted. Coronary vascular stent. There is atherosclerotic calcification of the thoracic aorta. There is diffuse interstitial coarsening with bibasilar atelectasis/ scarring. There is blunting of the costophrenic angles suspicious for small pleural effusion. A focal ovoid area of increased density in the right mid lung field may represent small fluid within the fissure. There is no focal consolidation or pneumothorax. Diffuse interstitial coarsening. No acute osseous pathology. IMPRESSION: Probable small bilateral pleural effusions and bibasilar atelectasis/ scarring. No definite focal consolidation. Stable cardiomegaly. Electronically Signed   By: Anner Crete M.D.   On: 08/08/2017 03:29    Patient Profile     61 year old gentleman with a history of atrial flutter, severe mitral regurgitation, chronic kidney disease. He has been having  hypotension and dialysis recently. The symptoms seem to have worsened after he had his most recent AV fistula placed    Assessment & Plan    1 Atrial fibrillation/futter-pt remains in atrial flutter with elevated HR; options are limited; not a candidate for further attempts at ablation. BP will not allow ca blocker or beta blocker. Digoxin with limited effectiveness and increased risk of toxicity given ESRD. There is question of amiodarone use given lung disease; however, there are no other good options for rate control. Discussed with patient and wife; they understand risk of pulmonary toxicity but willing to proceed. Begin amiodarone 400 mg BID and follow HR.  2 Hypotension-now on neo; continue midodrine. Management per CCM. Felt related to pulmonary hypertension and RV failure.  3 ESRD-per nephrology.  4 COPD/Lung cancer  5 NCB  Prognosis appears to be poor.  For questions or updates, please contact [place] Please consult [email or web address] for contact info under Cardiology/STEMI.      Signed, Kirk Ruths, MD  08/08/2017, 7:42 AM

## 2017-08-08 NOTE — Progress Notes (Signed)
IMTS paged again for HR 140s. Tele monitor reading lower at times, but 12 lead EKG connected in room continually reading 140s. IMTS at bedside, BP dropping, unsure of accuracy of leg BP 71/38 (48).

## 2017-08-08 NOTE — Progress Notes (Signed)
[facility] Progress Note Patient Name: Daison Braxton DOB: [birth year] MRN: [id number]   Date of Service  08/08/2017  HPI/Events of Note  Calcium = 5.9 and Albumin = 2.3 Calcium corrects to 7.26.  eICU Interventions  Will replace Ca++.      Intervention Category Major Interventions: Electrolyte abnormality - evaluation and management  Evoleth Nordmeyer [place] 08/08/2017, 5:09 AM

## 2017-08-08 NOTE — Progress Notes (Signed)
Paged regarding recurrent tachycardia to 140s. On evaluation, the pt's HR was consistently elevated to 130s-140s- narrow complex consistent with his underlying a-fib/flutter. Repeat BP 71/38 (MAP 48) taken from LE cuff, though, as documented elsewhere, difficult to obtain accurate BP readings. Given his BP, amiodarone was not initiated, as previously recommended by cardiology. PCCM was consulted for further evaluation given his drop in blood pressures and increased HR, each of which appear to have been improved through the day. Appreciate their assistance.

## 2017-08-08 NOTE — Consult Note (Signed)
PULMONARY / CRITICAL CARE MEDICINE   Name: [name] MRN: [id number] DOB: 1956-04-04    ADMISSION DATE:  08/01/2017 CONSULTATION DATE: August 08, 2017  REFERRING MD: Hospitalist service teaching service  CHIEF COMPLAINT: Persistent hypotension  HISTORY OF PRESENT ILLNESS:   Patient is 61 year old Caucasian male with past medical history of end-stage renal disease on hemodialysis for 18 years multiple fistulas and failed fistulas he is being dialyzed by an Ash catheter in the right upper chest hyperlipidemia depression history of preserved EF diastolic heart failure atherosclerosis history of A. fib status post ablation who is been in the floor persistent hypotension he was seen by palliative and made DNR had episodes of tachycardia overnight where he was thought to be in A. fib by the time I saw him he was just in sinus tach heart rate is around 120-140s mostly in the 122 he has been hypotensive more than the usually runs his blood pressure usually in the 80/40 but lately he has been down to the 60s 70s with map 50s patient is asymptomatic he has no chest pain fever chills rigors nausea vomiting diarrhea loss of consciousness.  SUBJECTIVE:  No events overnight, no acute distress  VITAL SIGNS: BP (!) 78/54   Pulse (!) 121   Temp 98.6 F (37 C) (Oral)   Resp 17   Ht 5\' 10"  (1.778 m)   Wt 83.8 kg (184 lb 11.9 oz)   SpO2 100%   BMI 26.51 kg/m   HEMODYNAMICS:    VENTILATOR SETTINGS:    INTAKE / OUTPUT: I/O last 3 completed shifts: In: 48 [P.O.:80; I.V.:390; IV Piggyback:120] Out: -   PHYSICAL EXAMINATION: General:  Chronically ill appearing male, NAD Neuro:  Awake and interactive, moving all ext to command HEENT:  [place]/AT, PERRL, EOM-I and MMM Cardiovascular:  IRIR, Nl S1/S2, -M/R/G. Lungs:  CTA bilaterally Abdomen:  Soft, NT, ND and +BS Musculoskeletal:  WNL Skin:  No rash   LABS:  BMET Recent Labs  Lab 08/03/17 0902 08/05/17 0437 08/08/17 0325  NA 141 137  140  K 4.0 3.8 3.8  CL 104 102 107  CO2 25 23 23   BUN 31* 32* 47*  CREATININE 5.22* 5.53* 6.24*  GLUCOSE 79 91 114*   Electrolytes Recent Labs  Lab 08/02/17 0326 08/03/17 0902 08/05/17 0437 08/08/17 0325  CALCIUM 6.4* 7.2* 8.2* 5.9*  MG  --   --   --  1.7  PHOS 5.8* 4.3 3.9  --    CBC Recent Labs  Lab 08/06/17 0504 08/07/17 0403 08/08/17 0325  WBC 6.5 6.6 9.8  HGB 11.7* 11.5* 10.2*  HCT 35.6* 35.3* 32.1*  PLT 75* 82* 69*   Coag's Recent Labs  Lab 08/06/17 0504 08/07/17 0403 08/08/17 0325  INR 1.65 1.70 2.23   Sepsis Markers Recent Labs  Lab 08/01/17 1011 08/01/17 1214 08/03/17 0902 08/03/17 1617 08/04/17 0442 08/05/17 0437  LATICACIDVEN 1.99* 1.78  --  1.8  --   --   PROCALCITON  --   --  0.56  --  0.69 0.59   ABG No results for input(s): PHART, PCO2ART, PO2ART in the last 168 hours.  Liver Enzymes Recent Labs  Lab 08/03/17 0902 08/05/17 0437 08/08/17 0325  AST  --   --  40  ALT  --   --  9*  ALKPHOS  --   --  98  BILITOT  --   --  1.0  ALBUMIN 2.7* 2.9* 2.3*   Cardiac Enzymes Recent Labs  Lab 08/01/17  1656 08/01/17 2139 08/02/17 0326  TROPONINI 0.07* 0.08* 0.08*   Glucose Recent Labs  Lab 08/06/17 1215 08/06/17 1234 08/08/17 0415  GLUCAP 104* 111* 126*   Imaging Dg Chest Port 1 View  Result Date: 08/08/2017 CLINICAL DATA:  61 year old male with shortness of breath. EXAM: PORTABLE CHEST 1 VIEW COMPARISON:  Chest radiograph dated 08/01/2017 FINDINGS: Right IJ dialysis catheter in similar position. There is stable cardiomegaly. Median sternotomy wires, mechanical heart valve, and CABG vascular clips noted. Coronary vascular stent. There is atherosclerotic calcification of the thoracic aorta. There is diffuse interstitial coarsening with bibasilar atelectasis/ scarring. There is blunting of the costophrenic angles suspicious for small pleural effusion. A focal ovoid area of increased density in the right mid lung field may represent  small fluid within the fissure. There is no focal consolidation or pneumothorax. Diffuse interstitial coarsening. No acute osseous pathology. IMPRESSION: Probable small bilateral pleural effusions and bibasilar atelectasis/ scarring. No definite focal consolidation. Stable cardiomegaly. Electronically Signed   By: Anner Crete M.D.   On: 08/08/2017 03:29   ASSESSMENT / PLAN:  PULMONARY - Titrate O2 for sat of 88-92% - Monitor for airway protection - DNR  CARDIOVASCULAR Likely a combination of adrenal insufficiency and heart failure, no signs of infection to suspect septic shock. - Stress dose steroids - Titrate neo for SBP of 80 - If continues to need >75 mcg of neo will need central access which will be very difficult access wise given history. - Midodrine  RENAL ESRD - ?CRRT vs HD pending SBP  GASTROINTESTINAL No active issues, monitor  HEMATOLOGIC A:   No active issues  INFECTIOUS A:   No indication for antibiotics  ENDOCRINE Stress dose steroids ordered  NEUROLOGIC Patient is mentating fine and he is DNR.  The patient is critically ill with multiple organ systems failure and requires high complexity decision making for assessment and support, frequent evaluation and titration of therapies, application of advanced monitoring technologies and extensive interpretation of multiple databases.   Critical Care Time devoted to patient care services described in this note is  45  Minutes. This time reflects time of care of this signee Dr Jennet Maduro. This critical care time does not reflect procedure time, or teaching time or supervisory time of PA/NP/Med student/Med Resident etc but could involve care discussion time.  Rush Farmer, M.D. [facility] Pulmonary/Critical Care Medicine. Pager: [phone]. After hours pager: [phone].  08/08/2017, 10:09 AM

## 2017-08-08 NOTE — Progress Notes (Signed)
Called regarding [name] having increased heart rates and periods of wide complex tachycardia.  On review of the telemetry he has sustained periods of tachycardia with heart rates in the 160s.  The tachycardia is irregular and wide suggestive of atrial fibrillation with abarency.  Given his hypotension, I suggested the primary team start amiodarone for rate control without a bolus.  Should the patient continue to have hypotension will likely need ICU admission for pressors.    Cameron Merl, MD, PhD Cardiology

## 2017-08-09 ENCOUNTER — Ambulatory Visit: Payer: Medicare Other | Admitting: Internal Medicine

## 2017-08-09 LAB — BASIC METABOLIC PANEL WITH GFR
Anion gap: 20 — ABNORMAL HIGH (ref 5–15)
BUN: 63 mg/dL — ABNORMAL HIGH (ref 6–20)
CO2: 18 mmol/L — ABNORMAL LOW (ref 22–32)
Calcium: 6.8 mg/dL — ABNORMAL LOW (ref 8.9–10.3)
Chloride: 99 mmol/L — ABNORMAL LOW (ref 101–111)
Creatinine, Ser: 7.52 mg/dL — ABNORMAL HIGH (ref 0.61–1.24)
GFR calc Af Amer: 8 mL/min — ABNORMAL LOW
GFR calc non Af Amer: 7 mL/min — ABNORMAL LOW
Glucose, Bld: 97 mg/dL (ref 65–99)
Potassium: 4.3 mmol/L (ref 3.5–5.1)
Sodium: 137 mmol/L (ref 135–145)

## 2017-08-09 LAB — CBC
HCT: 39 % (ref 39.0–52.0)
HEMOGLOBIN: 12.9 g/dL — AB (ref 13.0–17.0)
MCH: 35.3 pg — AB (ref 26.0–34.0)
MCHC: 33.1 g/dL (ref 30.0–36.0)
MCV: 106.8 fL — AB (ref 78.0–100.0)
PLATELETS: 104 10*3/uL — AB (ref 150–400)
RBC: 3.65 MIL/uL — AB (ref 4.22–5.81)
RDW: 18.8 % — ABNORMAL HIGH (ref 11.5–15.5)
WBC: 9.9 10*3/uL (ref 4.0–10.5)

## 2017-08-09 LAB — PHOSPHORUS: Phosphorus: 6.8 mg/dL — ABNORMAL HIGH (ref 2.5–4.6)

## 2017-08-09 LAB — PROTIME-INR
INR: 2.26
Prothrombin Time: 24.8 seconds — ABNORMAL HIGH (ref 11.4–15.2)

## 2017-08-09 LAB — MAGNESIUM: Magnesium: 2.1 mg/dL (ref 1.7–2.4)

## 2017-08-09 MED ORDER — PENTAFLUOROPROP-TETRAFLUOROETH EX AERO
1.0000 | INHALATION_SPRAY | CUTANEOUS | Status: DC | PRN
Start: 2017-08-09 — End: 2017-08-12

## 2017-08-09 MED ORDER — SODIUM CHLORIDE 0.9 % IV SOLN
100.0000 mL | INTRAVENOUS | Status: DC | PRN
Start: 2017-08-09 — End: 2017-08-12

## 2017-08-09 MED ORDER — LIDOCAINE-PRILOCAINE 2.5-2.5 % EX CREA
1.0000 | TOPICAL_CREAM | CUTANEOUS | Status: DC | PRN
Start: 2017-08-09 — End: 2017-08-12

## 2017-08-09 MED ORDER — LIDOCAINE HCL (PF) 1 % IJ SOLN: 5.0000 mL | INTRAMUSCULAR | Status: DC | PRN

## 2017-08-09 MED ORDER — ALTEPLASE 2 MG IJ SOLR: 2.0000 mg | Freq: Once | INTRAMUSCULAR | Status: DC | PRN

## 2017-08-09 MED ORDER — VITAMIN K1 10 MG/ML IJ SOLN
10.0000 mg | Freq: Once | INTRAVENOUS | Status: AC
  Administered 2017-08-09: 10 mg via INTRAVENOUS
  Filled 2017-08-09: qty 1

## 2017-08-09 MED ORDER — SODIUM CHLORIDE 0.9% FLUSH: 10.0000 mL | INTRAVENOUS | Status: DC | PRN

## 2017-08-09 MED ORDER — CHLORHEXIDINE GLUCONATE CLOTH 2 % EX PADS
6.0000 | MEDICATED_PAD | Freq: Every day | CUTANEOUS | Status: DC
  Administered 2017-08-09 – 2017-08-10 (×2): 6 via TOPICAL

## 2017-08-09 MED ORDER — DIPHENHYDRAMINE HCL 25 MG PO CAPS
25.0000 mg | ORAL_CAPSULE | Freq: Once | ORAL | Status: AC
  Administered 2017-08-09: 25 mg via ORAL
  Filled 2017-08-09: qty 1

## 2017-08-09 MED ORDER — HEPARIN SODIUM (PORCINE) 1000 UNIT/ML DIALYSIS
1000.0000 [IU] | INTRAMUSCULAR | Status: DC | PRN
  Filled 2017-08-09: qty 1

## 2017-08-09 MED ORDER — WHITE PETROLATUM EX OINT
TOPICAL_OINTMENT | CUTANEOUS | Status: AC
  Administered 2017-08-09: 13:00:00
  Filled 2017-08-09: qty 28.35

## 2017-08-09 NOTE — Consult Note (Signed)
Discussions with pt yesterday regarding whether or not fistula is contributing to right heart failure.  The only way this could be eliminated as a possible contributor would be ligation of his fistula.  He currently has a catheter in place.  Will discuss with Nephrology today  Tentatively plan for fistula ligation tomorrow if Nephrology is in agreement.  Ruta Hinds, MD [facility] [place] Office: [phone] Pager: [phone]

## 2017-08-09 NOTE — H&P (View-Only) (Signed)
Discussions with pt yesterday regarding whether or not fistula is contributing to right heart failure.  The only way this could be eliminated as a possible contributor would be ligation of his fistula.  He currently has a catheter in place.  Will discuss with Nephrology today  Tentatively plan for fistula ligation tomorrow if Nephrology is in agreement.  Ruta Hinds, MD [facility] Office: [phone] Pager: [phone]

## 2017-08-09 NOTE — Progress Notes (Signed)
ANTICOAGULATION CONSULT NOTE - Follow-up Consult  Pharmacy Consult for Warfarin Indication: atrial fibrillation  Labs: Recent Labs    08/07/17 0403 08/08/17 0325 08/09/17 0245  HGB 11.5* 10.2* 12.9*  HCT 35.3* 32.1* 39.0  PLT 82* 69* 104*  LABPROT 19.9* 24.6* 24.8*  INR 1.70 2.23 2.26  CREATININE  --  6.24* 7.52*   Assessment: [name] here with dizziness and SOB. Pharmacy consulted to resume home warfarin therapy for h/o AFib. INR on admission is supratherapeutic at 3.26.  INR remains therapeutic at 2.26. Hgb 12.9 and PLTc remains chronically low but improved. Planning fistula ligation tomorrow.   New drug interaction: amiodarone 400 mg BID   Home warfarin dose: 7.5 mg on Mon; 3.75 mg on all other days.   Goal of Therapy:  INR 2-3 Monitor platelets by anticoagulation protocol: Yes   Plan:  1. Hold coumadin today per Vascular. Vit K 10 mg IV ordered.  2. Daily INR, CBC 3. F/u when to resume warfarin  Albertina Parr, PharmD., BCPS Clinical Pharmacist Pager [phone]

## 2017-08-09 NOTE — Progress Notes (Signed)
PULMONARY / CRITICAL CARE MEDICINE   Name: [name] MRN: [id number] DOB: 1956/08/15    ADMISSION DATE:  08/01/2017 CONSULTATION DATE: August 08, 2017  REFERRING MD: Hospitalist service teaching service  CHIEF COMPLAINT: Persistent hypotension  HISTORY OF PRESENT ILLNESS:   Patient is 61 year old Caucasian male with past medical history of end-stage renal disease on hemodialysis for 18 years multiple fistulas and failed fistulas he is being dialyzed by an Ash catheter in the right upper chest hyperlipidemia depression history of preserved EF diastolic heart failure atherosclerosis history of A. fib status post ablation who is been in the floor persistent hypotension he was seen by palliative and made DNR had episodes of tachycardia overnight where he was thought to be in A. fib by the time I saw him he was just in sinus tach heart rate is around 120-140s mostly in the 122 he has been hypotensive more than the usually runs his blood pressure usually in the 80/40 but lately he has been down to the 60s 70s with map 50s patient is asymptomatic he has no chest pain fever chills rigors nausea vomiting diarrhea loss of consciousness.  SUBJECTIVE:  No events overnight, no acute distress, remains on neo  VITAL SIGNS: BP (!) 86/72   Pulse (!) 125   Temp 98.4 F (36.9 C) (Oral)   Resp 19   Ht 5\' 10"  (1.778 m)   Wt 87.9 kg (193 lb 12.6 oz)   SpO2 97%   BMI 27.81 kg/m   HEMODYNAMICS:    VENTILATOR SETTINGS:    INTAKE / OUTPUT: I/O last 3 completed shifts: In: 5573 [P.O.:350; I.V.:3870; IV Piggyback:170] Out: -   PHYSICAL EXAMINATION: General:  Chronically ill appearing male, NAD Neuro:  Awake and interactive, moving all ext to command HEENT:  [place]/AT, PERRL, EOM-I and MMM Cardiovascular:  IRIR, Nl S1/S2, -M/R/G. Lungs:  Decreased BS at the bases Abdomen:  Soft, NT, ND and +BS Musculoskeletal:  WNL Skin:  No rash   LABS:  BMET Recent Labs  Lab 08/05/17 0437 08/08/17 0325  08/09/17 0245  NA 137 140 137  K 3.8 3.8 4.3  CL 102 107 99*  CO2 23 23 18*  BUN 32* 47* 63*  CREATININE 5.53* 6.24* 7.52*  GLUCOSE 91 114* 97   Electrolytes Recent Labs  Lab 08/03/17 0902 08/05/17 0437 08/08/17 0325 08/09/17 0245  CALCIUM 7.2* 8.2* 5.9* 6.8*  MG  --   --  1.7 2.1  PHOS 4.3 3.9  --  6.8*   CBC Recent Labs  Lab 08/07/17 0403 08/08/17 0325 08/09/17 0245  WBC 6.6 9.8 9.9  HGB 11.5* 10.2* 12.9*  HCT 35.3* 32.1* 39.0  PLT 82* 69* 104*   Coag's Recent Labs  Lab 08/07/17 0403 08/08/17 0325 08/09/17 0245  INR 1.70 2.23 2.26   Sepsis Markers Recent Labs  Lab 08/03/17 0902 08/03/17 1617 08/04/17 0442 08/05/17 0437  LATICACIDVEN  --  1.8  --   --   PROCALCITON 0.56  --  0.69 0.59   ABG No results for input(s): PHART, PCO2ART, PO2ART in the last 168 hours.  Liver Enzymes Recent Labs  Lab 08/03/17 0902 08/05/17 0437 08/08/17 0325  AST  --   --  40  ALT  --   --  9*  ALKPHOS  --   --  98  BILITOT  --   --  1.0  ALBUMIN 2.7* 2.9* 2.3*   Cardiac Enzymes No results for input(s): TROPONINI, PROBNP in the last 168 hours. Glucose Recent  Labs  Lab 08/06/17 1215 08/06/17 1234 08/08/17 0415  GLUCAP 104* 111* 126*   Imaging No results found. ASSESSMENT / PLAN:  PULMONARY - Titrate O2 for sat of 88-92% - Monitor for airway protection - DNR  CARDIOVASCULAR Likely a combination of adrenal insufficiency and heart failure, no signs of infection to suspect septic shock. - Stress dose steroids to be continued - Titrate neo for SBP of 80, down to 60 mcg - No need to place TLC since <75 mcg - Midodrine  RENAL ESRD - Attempt HD today - BMET in AM - Replace electrolytes as indicated  GASTROINTESTINAL No active issues, monitor Regular diet  HEMATOLOGIC A:   No active issues  INFECTIOUS A:   No indication for antibiotics  ENDOCRINE Stress dose steroids ordered  NEUROLOGIC Patient is mentating fine and he is DNR.  The  patient is critically ill with multiple organ systems failure and requires high complexity decision making for assessment and support, frequent evaluation and titration of therapies, application of advanced monitoring technologies and extensive interpretation of multiple databases.   Critical Care Time devoted to patient care services described in this note is  45  Minutes. This time reflects time of care of this signee Dr Jennet Maduro. This critical care time does not reflect procedure time, or teaching time or supervisory time of PA/NP/Med student/Med Resident etc but could involve care discussion time.  Rush Farmer, M.D. [facility] Pulmonary/Critical Care Medicine. Pager: [phone]. After hours pager: [phone].  08/09/2017, 9:27 AM

## 2017-08-09 NOTE — Progress Notes (Signed)
Palliative f/u from Nyra Market, NP. Chart reviewed. Visited with patient at bedside. He is awake, alert, and oriented. Currently receiving HD. Remains on phenylephrine. DNR now. Fistula ligation tomorrow. Patient denies discomfort, questions, or concerns during visit. Palliative will continue to follow.   NO CHARGE  Ihor Dow, FNP-C Palliative Medicine Team  Phone: [phone] Fax: [phone]

## 2017-08-09 NOTE — Consult Note (Signed)
Discussed with Dr Hollie Salk.  Will proceed with fistula ligation tomorrow Vit K today Hold coumadin NPO p midnight Check INR am  Ruta Hinds, MD [facility] [place]: [phone] Pager: [phone]

## 2017-08-09 NOTE — Progress Notes (Signed)
Progress Note  Patient Name: [name] Date of Encounter: 08/09/2017  Primary Cardiologist: Dr Rayann Heman  Subjective   No CP; mild DOE  Inpatient Medications    Scheduled Meds: . amiodarone  400 mg Oral BID  . arformoterol  15 mcg Nebulization BID  . atorvastatin  20 mg Oral QHS  . citalopram  20 mg Oral QHS  . hydrocortisone sodium succinate  50 mg Intravenous Q6H  . midodrine  10 mg Oral TID  . multivitamin  1 tablet Oral QHS  . pantoprazole  40 mg Oral BID  . umeclidinium bromide  1 puff Inhalation Daily  . Warfarin - Pharmacist Dosing Inpatient   Does not apply q1800   Continuous Infusions: . ferric gluconate (FERRLECIT/NULECIT) IV Stopped (08/03/17 1235)  . phenylephrine (NEO-SYNEPHRINE) Adult infusion 70 mcg/min (08/09/17 0515)   PRN Meds: acetaminophen **OR** acetaminophen, albuterol   Vital Signs    Vitals:   08/09/17 0715 08/09/17 0720 08/09/17 0723 08/09/17 0724  BP: (!) 88/74     Pulse: (!) 133     Resp: 19     Temp:    98.4 F (36.9 C)  TempSrc:    Oral  SpO2: 96% 96% 96%   Weight:      Height:        Intake/Output Summary (Last 24 hours) at 08/09/2017 0742 Last data filed at 08/09/2017 0500 Gross per 24 hour  Intake 3850 ml  Output -  Net 3850 ml   Filed Weights   08/07/17 0406 08/08/17 0420 08/09/17 0500  Weight: 222 lb 3.6 oz (100.8 kg) 184 lb 11.9 oz (83.8 kg) 193 lb 12.6 oz (87.9 kg)    Telemetry    Atrial fibrillation/flutter with RVR; rate mildly improved - Personally Reviewed   Physical Exam   GEN: WD, chronically ill appearing. NAD Neck: Supple Cardiac: irregular, tachycardic, 2/6 systolic murmur Respiratory: CTA GI: Soft, no masses, diffuse abdominal wall edema MS: chronic skin changes, 1+ thigh edema Neuro:  Grossly intact   Labs    Chemistry Recent Labs  Lab 08/03/17 0902 08/05/17 0437 08/08/17 0325 08/09/17 0245  NA 141 137 140 137  K 4.0 3.8 3.8 4.3  CL 104 102 107 99*  CO2 25 23 23  18*  GLUCOSE 79 91  114* 97  BUN 31* 32* 47* 63*  CREATININE 5.22* 5.53* 6.24* 7.52*  CALCIUM 7.2* 8.2* 5.9* 6.8*  PROT  --   --  5.8*  --   ALBUMIN 2.7* 2.9* 2.3*  --   AST  --   --  40  --   ALT  --   --  9*  --   ALKPHOS  --   --  98  --   BILITOT  --   --  1.0  --   GFRNONAA 11* 10* 9* 7*  GFRAA 12* 12* 10* 8*  ANIONGAP 12 12 10  20*     Hematology Recent Labs  Lab 08/07/17 0403 08/08/17 0325 08/09/17 0245  WBC 6.6 9.8 9.9  RBC 3.21* 2.98* 3.65*  HGB 11.5* 10.2* 12.9*  HCT 35.3* 32.1* 39.0  MCV 110.0* 107.7* 106.8*  MCH 35.8* 34.2* 35.3*  MCHC 32.6 31.8 33.1  RDW 19.4* 18.5* 18.8*  PLT 82* 69* 104*      Radiology    Dg Chest Port 1 View  Result Date: 08/08/2017 CLINICAL DATA:  61 year old male with shortness of breath. EXAM: PORTABLE CHEST 1 VIEW COMPARISON:  Chest radiograph dated 08/01/2017 FINDINGS: Right IJ dialysis catheter  in similar position. There is stable cardiomegaly. Median sternotomy wires, mechanical heart valve, and CABG vascular clips noted. Coronary vascular stent. There is atherosclerotic calcification of the thoracic aorta. There is diffuse interstitial coarsening with bibasilar atelectasis/ scarring. There is blunting of the costophrenic angles suspicious for small pleural effusion. A focal ovoid area of increased density in the right mid lung field may represent small fluid within the fissure. There is no focal consolidation or pneumothorax. Diffuse interstitial coarsening. No acute osseous pathology. IMPRESSION: Probable small bilateral pleural effusions and bibasilar atelectasis/ scarring. No definite focal consolidation. Stable cardiomegaly. Electronically Signed   By: Anner Crete M.D.   On: 08/08/2017 03:29    Patient Profile     61 year old gentleman with a history of atrial flutter, severe mitral regurgitation, chronic kidney disease. He has been having hypotension and dialysis recently. The symptoms seem to have worsened after he had his most recent AV  fistula placed    Assessment & Plan    1 Atrial fibrillation/futter-pt remains in atrial flutter with elevated HR but mildly improved compared to yesterday; options remain limited; not a candidate for further attempts at ablation (discussed with Dr Rayann Heman yesterday). BP will not allow ca blocker or beta blocker. Digoxin with limited effectiveness and increased risk of toxicity given ESRD. Amiodarone initiated yesterday; will continue 400 mg BID for 1 week and then 200 mg daily thereafter.  2 Hypotension-continue neo and wean as tolerated; continue midodrine. Management per CCM. Decreased BP felt secondary to pulmonary hypertension and RV failure.  3 ESRD-Dialysis per nephrology  4 COPD/Lung cancer  5 NCB; pt has declined hospice per notes.   For questions or updates, please contact [place] Please consult [email or web address] for contact info under Cardiology/STEMI.      Signed, Kirk Ruths, MD  08/09/2017, 7:42 AM

## 2017-08-09 NOTE — Progress Notes (Signed)
[facility] Progress Note   Assessment/ Plan:   1. Hypotension - acute on chronic.  On midodrine 10 tid. Severe RHF on echo this admit.  Patient has severe cardiac disease (afib/ uncont HR/ failed ablation/ severe RHF/ pulm HTN/ NSVT, etc).  Pt had the following discussion with Dr. Jonnie Finner: he told him that from here forward any hemodialysis procedure for him would be considered high-risk and that we could continue to try to dialyze him but w/ the condition of DNR status. Also gave him the option of hospice. Discussed all this w/ the wife present.  He declined hospice, agreed to DNR status and continued HD.  He is now also on stress-dose steroids.  VVS has been consulted, ligating fistula tomorrow which which is reasonable.  Greatly appreciate their assistance  2. Volume- Vol up, will see if we can remove fluid with IHD today. 3. ESRD - HD MWF, deferred yesterday due to sig amount of phenylephrine.  This has been greatly weaned so we will attempt IHD today.  If we can't achieve UF today with IHD, will consider pursing CRRT. 4. COPD/ pulm HTN- on home O2 5. Aflutter / fib -sinus tach on monitor hx failed ablation, have decided to forward with amiodarone.  Cardiology following. 6. Possible cellulitis L arm; off antibiotics, per PCCM 7. Anemia of CKD - no esa needed, Hgb 11.7 8. MBD - cont meds 9. CAD hx CABG/ DES 10. Nutrition - add multivit alb 2.9   Subjective:    Phenylephrine weaned down significantly this AM.  Talking and mentating appropriately.  Plan for ligation of recently placed LUE AVF tomorrow with VVS which I think is a reasonable approach.   Objective:   BP (!) 88/74   Pulse (!) 133   Temp 98.4 F (36.9 C) (Oral)   Resp 19   Ht 5\' 10"  (1.778 m)   Wt 87.9 kg (193 lb 12.6 oz)   SpO2 96%   BMI 27.81 kg/m   Physical Exam: General: NAD, talkative, sitting up in bed.   Heart: tachycardic, regular Lungs: grossly clear Abdomen:soft NT Extremities: burnished  skin lower extremities, no frank edema but dependent edema sacrum Dialysis Access: right IJ TDC in place; L RC AVF clotted, new LUE AVF + T/B with well-approximated incision, erythema essentially resolved.   Labs: BMET Recent Labs  Lab 08/03/17 0902 08/05/17 0437 08/08/17 0325 08/09/17 0245  NA 141 137 140 137  K 4.0 3.8 3.8 4.3  CL 104 102 107 99*  CO2 25 23 23  18*  GLUCOSE 79 91 114* 97  BUN 31* 32* 47* 63*  CREATININE 5.22* 5.53* 6.24* 7.52*  CALCIUM 7.2* 8.2* 5.9* 6.8*  PHOS 4.3 3.9  --  6.8*   CBC Recent Labs  Lab 08/06/17 0504 08/07/17 0403 08/08/17 0325 08/09/17 0245  WBC 6.5 6.6 9.8 9.9  HGB 11.7* 11.5* 10.2* 12.9*  HCT 35.6* 35.3* 32.1* 39.0  MCV 108.5* 110.0* 107.7* 106.8*  PLT 75* 82* 69* 104*    @IMGRELPRIORS @ Medications:    . amiodarone  400 mg Oral BID  . arformoterol  15 mcg Nebulization BID  . atorvastatin  20 mg Oral QHS  . citalopram  20 mg Oral QHS  . hydrocortisone sodium succinate  50 mg Intravenous Q6H  . midodrine  10 mg Oral TID  . multivitamin  1 tablet Oral QHS  . pantoprazole  40 mg Oral BID  . umeclidinium bromide  1 puff Inhalation Daily  . Warfarin - Pharmacist Dosing  Inpatient   Does not apply [place], MD [place] pgr [phone] 08/09/2017, 8:34 AM

## 2017-08-09 NOTE — Procedures (Signed)
Patient seen and examined on Hemodialysis. QB 300 mL/ min via R IJ TDC.  UF goal 3L.  Tolerating treatment well.  BP 114/ 47.  Treatment adjusted as needed.  Madelon Lips MD [facility] pgr [phone] 12:56 PM

## 2017-08-10 ENCOUNTER — Encounter (HOSPITAL_COMMUNITY)
Admission: EM | Disposition: A | Discharge status: Home / Self Care | Payer: Self-pay | Source: Home / Self Care | Attending: Internal Medicine

## 2017-08-10 ENCOUNTER — Inpatient Hospital Stay (HOSPITAL_COMMUNITY): Payer: Medicare Other | Admitting: Anesthesiology

## 2017-08-10 DIAGNOSIS — I481 Persistent atrial fibrillation: Secondary | ICD-10-CM

## 2017-08-10 DIAGNOSIS — T82898A Other specified complication of vascular prosthetic devices, implants and grafts, initial encounter: Secondary | ICD-10-CM

## 2017-08-10 DIAGNOSIS — R579 Shock, unspecified: Secondary | ICD-10-CM

## 2017-08-10 DIAGNOSIS — E875 Hyperkalemia: Secondary | ICD-10-CM

## 2017-08-10 DIAGNOSIS — I4819 Other persistent atrial fibrillation: Secondary | ICD-10-CM

## 2017-08-10 DIAGNOSIS — L8990 Pressure ulcer of unspecified site, unspecified stage: Secondary | ICD-10-CM

## 2017-08-10 HISTORY — PX: LIGATION OF ARTERIOVENOUS  FISTULA: SHX5948

## 2017-08-10 LAB — CBC
HCT: 36.7 % — ABNORMAL LOW (ref 39.0–52.0)
HEMOGLOBIN: 11.9 g/dL — AB (ref 13.0–17.0)
MCH: 34.6 pg — AB (ref 26.0–34.0)
MCHC: 32.4 g/dL (ref 30.0–36.0)
MCV: 106.7 fL — AB (ref 78.0–100.0)
PLATELETS: 108 10*3/uL — AB (ref 150–400)
RBC: 3.44 MIL/uL — AB (ref 4.22–5.81)
RDW: 18.8 % — ABNORMAL HIGH (ref 11.5–15.5)
WBC: 9.8 10*3/uL (ref 4.0–10.5)

## 2017-08-10 LAB — BASIC METABOLIC PANEL
Anion gap: 17 — ABNORMAL HIGH (ref 5–15)
BUN: 48 mg/dL — AB (ref 6–20)
CHLORIDE: 96 mmol/L — AB (ref 101–111)
CO2: 23 mmol/L (ref 22–32)
CREATININE: 5.87 mg/dL — AB (ref 0.61–1.24)
Calcium: 7 mg/dL — ABNORMAL LOW (ref 8.9–10.3)
GFR calc Af Amer: 11 mL/min — ABNORMAL LOW (ref >60)
GFR calc non Af Amer: 9 mL/min — ABNORMAL LOW (ref >60)
GLUCOSE: 127 mg/dL — AB (ref 65–99)
POTASSIUM: 5.2 mmol/L — AB (ref 3.5–5.1)
Sodium: 136 mmol/L (ref 135–145)

## 2017-08-10 LAB — PHOSPHORUS: Phosphorus: 5.3 mg/dL — ABNORMAL HIGH (ref 2.5–4.6)

## 2017-08-10 LAB — PROTIME-INR
INR: 2.34
PROTHROMBIN TIME: 25.5 s — AB (ref 11.4–15.2)

## 2017-08-10 LAB — TYPE AND SCREEN
ABO/RH(D): O POS
ANTIBODY SCREEN: NEGATIVE

## 2017-08-10 LAB — MAGNESIUM: Magnesium: 2.1 mg/dL (ref 1.7–2.4)

## 2017-08-10 SURGERY — LIGATION OF ARTERIOVENOUS  FISTULA
Anesthesia: Monitor Anesthesia Care | Site: Arm Lower | Laterality: Left

## 2017-08-10 MED ORDER — ONDANSETRON HCL 4 MG/2ML IJ SOLN
INTRAMUSCULAR | Status: AC
  Filled 2017-08-10: qty 2

## 2017-08-10 MED ORDER — DEXAMETHASONE SODIUM PHOSPHATE 10 MG/ML IJ SOLN
INTRAMUSCULAR | Status: AC
  Filled 2017-08-10: qty 1

## 2017-08-10 MED ORDER — DIPHENHYDRAMINE HCL 25 MG PO CAPS
25.0000 mg | ORAL_CAPSULE | Freq: Once | ORAL | Status: AC
  Administered 2017-08-10: 25 mg via ORAL
  Filled 2017-08-10: qty 1

## 2017-08-10 MED ORDER — PREDNISONE 20 MG PO TABS
20.0000 mg | ORAL_TABLET | Freq: Every day | ORAL | Status: DC
  Administered 2017-08-10 – 2017-08-11 (×2): 20 mg via ORAL
  Filled 2017-08-10 (×3): qty 1

## 2017-08-10 MED ORDER — 0.9 % SODIUM CHLORIDE (POUR BTL) OPTIME
TOPICAL | Status: DC | PRN
  Administered 2017-08-10: 1000 mL

## 2017-08-10 MED ORDER — FENTANYL CITRATE (PF) 250 MCG/5ML IJ SOLN
INTRAMUSCULAR | Status: AC
  Filled 2017-08-10: qty 5

## 2017-08-10 MED ORDER — LIDOCAINE HCL (PF) 1 % IJ SOLN
INTRAMUSCULAR | Status: AC
  Filled 2017-08-10: qty 30

## 2017-08-10 MED ORDER — PROPOFOL 10 MG/ML IV BOLUS
INTRAVENOUS | Status: AC
  Filled 2017-08-10: qty 20

## 2017-08-10 MED ORDER — SODIUM CHLORIDE 0.9 % IV SOLN
Freq: Once | INTRAVENOUS | Status: AC
  Administered 2017-08-10: 09:00:00 via INTRAVENOUS

## 2017-08-10 MED ORDER — LIDOCAINE HCL (PF) 1 % IJ SOLN
INTRAMUSCULAR | Status: DC | PRN
  Administered 2017-08-10: 30 mL

## 2017-08-10 MED ORDER — DIPHENHYDRAMINE HCL 25 MG PO CAPS
25.0000 mg | ORAL_CAPSULE | Freq: Four times a day (QID) | ORAL | Status: DC | PRN
  Administered 2017-08-10 – 2017-08-12 (×3): 25 mg via ORAL
  Filled 2017-08-10 (×3): qty 1

## 2017-08-10 MED ORDER — LIDOCAINE 2% (20 MG/ML) 5 ML SYRINGE
INTRAMUSCULAR | Status: AC
  Filled 2017-08-10: qty 5

## 2017-08-10 MED ORDER — PREDNISONE 50 MG PO TABS
50.0000 mg | ORAL_TABLET | Freq: Every day | ORAL | Status: DC
  Filled 2017-08-10: qty 1

## 2017-08-10 MED ORDER — SODIUM CHLORIDE 0.9 % IV SOLN
INTRAVENOUS | Status: DC
  Administered 2017-08-10 – 2017-08-12 (×2): via INTRAVENOUS

## 2017-08-10 SURGICAL SUPPLY — 36 items
ADH SKN CLS APL DERMABOND .7 (GAUZE/BANDAGES/DRESSINGS) ×1
AGENT HMST SPONGE THK3/8 (HEMOSTASIS)
BANDAGE ACE 4X5 VEL STRL LF (GAUZE/BANDAGES/DRESSINGS) ×1 IMPLANT
BANDAGE ACE 6X5 VEL STRL LF (GAUZE/BANDAGES/DRESSINGS) ×1 IMPLANT
BNDG GAUZE ELAST 4 BULKY (GAUZE/BANDAGES/DRESSINGS) ×1 IMPLANT
CANISTER SUCT 3000ML PPV (MISCELLANEOUS) ×2 IMPLANT
CLIP TI MEDIUM 6 (CLIP) ×1 IMPLANT
CLIP TI WIDE RED SMALL 6 (CLIP) ×1 IMPLANT
DERMABOND ADVANCED (GAUZE/BANDAGES/DRESSINGS) ×1
DERMABOND ADVANCED .7 DNX12 (GAUZE/BANDAGES/DRESSINGS) ×1 IMPLANT
ELECT BLADE 4.0 EZ CLEAN MEGAD (MISCELLANEOUS) ×2
ELECT REM PT RETURN 9FT ADLT (ELECTROSURGICAL) ×2
ELECTRODE BLDE 4.0 EZ CLN MEGD (MISCELLANEOUS) IMPLANT
ELECTRODE REM PT RTRN 9FT ADLT (ELECTROSURGICAL) ×1 IMPLANT
GAUZE SPONGE 4X4 12PLY STRL (GAUZE/BANDAGES/DRESSINGS) ×1 IMPLANT
GLOVE BIO SURGEON STRL SZ7 (GLOVE) ×1 IMPLANT
GLOVE BIO SURGEON STRL SZ7.5 (GLOVE) ×2 IMPLANT
GOWN STRL REUS W/ TWL LRG LVL3 (GOWN DISPOSABLE) ×3 IMPLANT
GOWN STRL REUS W/TWL LRG LVL3 (GOWN DISPOSABLE) ×6
HEMOSTAT SPONGE AVITENE ULTRA (HEMOSTASIS) IMPLANT
KIT BASIN OR (CUSTOM PROCEDURE TRAY) ×2 IMPLANT
KIT ROOM TURNOVER OR (KITS) ×2 IMPLANT
LOOP VESSEL MINI RED (MISCELLANEOUS) IMPLANT
NS IRRIG 1000ML POUR BTL (IV SOLUTION) ×2 IMPLANT
PACK CV ACCESS (CUSTOM PROCEDURE TRAY) ×2 IMPLANT
PAD ARMBOARD 7.5X6 YLW CONV (MISCELLANEOUS) ×4 IMPLANT
SUT ETHILON 3 0 PS 1 (SUTURE) ×2 IMPLANT
SUT PROLENE 6 0 CC (SUTURE) IMPLANT
SUT SILK 0 (SUTURE) IMPLANT
SUT VIC AB 3-0 SH 27 (SUTURE) ×4
SUT VIC AB 3-0 SH 27X BRD (SUTURE) ×1 IMPLANT
SUT VIC AB 4-0 PS2 18 (SUTURE) ×1 IMPLANT
SUT VICRYL 4-0 PS2 18IN ABS (SUTURE) ×2 IMPLANT
TOWEL GREEN STERILE (TOWEL DISPOSABLE) ×2 IMPLANT
UNDERPAD 30X30 (UNDERPADS AND DIAPERS) ×2 IMPLANT
WATER STERILE IRR 1000ML POUR (IV SOLUTION) ×2 IMPLANT

## 2017-08-10 NOTE — Transfer of Care (Signed)
Immediate Anesthesia Transfer of Care Note  Patient: [name]  Procedure(s) Performed: LIGATION OF ARTERIOVENOUS  FISTULA (Left Arm Lower)  Patient Location: PACU  Anesthesia Type:MAC  Level of Consciousness: awake, alert , oriented and patient cooperative  Airway & Oxygen Therapy: Patient Spontanous Breathing and Patient connected to nasal cannula oxygen  Post-op Assessment: Report given to RN, Post -op Vital signs reviewed and stable, Patient moving all extremities and Patient moving all extremities X 4  Post vital signs: Reviewed and stable  Last Vitals:  Vitals:   08/10/17 1230 08/10/17 1245  BP:    Pulse: (!) 113 (!) 113  Resp: 19 20  Temp:  36.6 C  SpO2: 95% 95%    Last Pain:  Vitals:   08/10/17 1245  TempSrc: Oral  PainSc:       Patients Stated Pain Goal: 1 (16/10/96 0454)  Complications: No apparent anesthesia complications

## 2017-08-10 NOTE — Progress Notes (Signed)
INR still greater than 2 this morning.  Will order 2 units of FFP.  Plan is for OR around noon today.  Keep NPO  Ruta Hinds, MD [facility] Office: [phone] Pager: [phone]

## 2017-08-10 NOTE — Interval H&P Note (Signed)
History and Physical Interval Note:  08/10/2017 1:32 PM  [name]  has presented today for surgery, with the diagnosis of complication of fistula  The various methods of treatment have been discussed with the patient and family. After consideration of risks, benefits and other options for treatment, the patient has consented to  Procedure(s): LIGATION OF ARTERIOVENOUS  FISTULA (Left) as a surgical intervention .  The patient's history has been reviewed, patient examined, no change in status, stable for surgery.  I have reviewed the patient's chart and labs.  Questions were answered to the patient's satisfaction.     Ruta Hinds

## 2017-08-10 NOTE — Progress Notes (Signed)
PT Cancellation Note  Patient Details Name: [name] MRN: [id number] DOB: 11-01-1955   Cancelled Treatment:    Reason Eval/Treat Not Completed: Patient at procedure or test/unavailable. Pt off of the floor to the OR. PT will continue to f/u with pt as available.    [place] 08/10/2017, 1:43 PM

## 2017-08-10 NOTE — Interval H&P Note (Signed)
History and Physical Interval Note:  08/10/2017 1:33 PM  [name]  has presented today for surgery, with the diagnosis of complication of fistula  The various methods of treatment have been discussed with the patient and family. After consideration of risks, benefits and other options for treatment, the patient has consented to  Procedure(s): LIGATION OF ARTERIOVENOUS  FISTULA (Left) as a surgical intervention .  The patient's history has been reviewed, patient examined, no change in status, stable for surgery.  I have reviewed the patient's chart and labs.  Questions were answered to the patient's satisfaction.     Ruta Hinds

## 2017-08-10 NOTE — Progress Notes (Signed)
PULMONARY / CRITICAL CARE MEDICINE   Name: [name] MRN: [id number] DOB: 09/13/1955    ADMISSION DATE:  08/01/2017 CONSULTATION DATE: August 08, 2017  REFERRING MD: Hospitalist service teaching service  CHIEF COMPLAINT: Persistent hypotension  HISTORY OF PRESENT ILLNESS:   Patient is 61 year old Caucasian male with past medical history of end-stage renal disease on hemodialysis for 18 years multiple fistulas and failed fistulas he is being dialyzed by an Ash catheter in the right upper chest hyperlipidemia depression history of preserved EF diastolic heart failure atherosclerosis history of A. fib status post ablation who is been in the floor persistent hypotension he was seen by palliative and made DNR had episodes of tachycardia overnight where he was thought to be in A. fib by the time I saw him he was just in sinus tach heart rate is around 120-140s mostly in the 122 he has been hypotensive more than the usually runs his blood pressure usually in the 80/40 but lately he has been down to the 60s 70s with map 50s patient is asymptomatic he has no chest pain fever chills rigors nausea vomiting diarrhea loss of consciousness.  SUBJECTIVE:  No events overnight, off pressors and doing well  VITAL SIGNS: BP (!) 81/70   Pulse (!) 114   Temp 97.8 F (36.6 C) (Oral)   Resp 18   Ht 5\' 10"  (1.778 m)   Wt 86.9 kg (191 lb 9.3 oz)   SpO2 100%   BMI 27.49 kg/m   HEMODYNAMICS:    VENTILATOR SETTINGS:    INTAKE / OUTPUT: I/O last 3 completed shifts: In: 2575 [P.O.:800; I.V.:1725; IV Piggyback:50] Out: 2500 [Other:2500]  PHYSICAL EXAMINATION: General:  Chronically ill appearing male, NAD Neuro:  Awake and interactive, moving all ext to command HEENT:  [place]/AT, PERRL, EOM-I and MMM Cardiovascular:  IRIR, Nl S1/S2, -M/R/G. Lungs:  CTA bilaterally Abdomen:  Soft, NT, ND and +BS Musculoskeletal:  WNL Skin:  No rash   LABS:  BMET Recent Labs  Lab 08/08/17 0325 08/09/17 0245  08/10/17 0235  NA 140 137 136  K 3.8 4.3 5.2*  CL 107 99* 96*  CO2 23 18* 23  BUN 47* 63* 48*  CREATININE 6.24* 7.52* 5.87*  GLUCOSE 114* 97 127*   Electrolytes Recent Labs  Lab 08/05/17 0437 08/08/17 0325 08/09/17 0245 08/10/17 0235  CALCIUM 8.2* 5.9* 6.8* 7.0*  MG  --  1.7 2.1 2.1  PHOS 3.9  --  6.8* 5.3*   CBC Recent Labs  Lab 08/08/17 0325 08/09/17 0245 08/10/17 0235  WBC 9.8 9.9 9.8  HGB 10.2* 12.9* 11.9*  HCT 32.1* 39.0 36.7*  PLT 69* 104* 108*   Coag's Recent Labs  Lab 08/08/17 0325 08/09/17 0245 08/10/17 0235  INR 2.23 2.26 2.34   Sepsis Markers Recent Labs  Lab 08/03/17 1617 08/04/17 0442 08/05/17 0437  LATICACIDVEN 1.8  --   --   PROCALCITON  --  0.69 0.59   ABG No results for input(s): PHART, PCO2ART, PO2ART in the last 168 hours.  Liver Enzymes Recent Labs  Lab 08/05/17 0437 08/08/17 0325  AST  --  40  ALT  --  9*  ALKPHOS  --  98  BILITOT  --  1.0  ALBUMIN 2.9* 2.3*   Cardiac Enzymes No results for input(s): TROPONINI, PROBNP in the last 168 hours. Glucose Recent Labs  Lab 08/06/17 1215 08/06/17 1234 08/08/17 0415  GLUCAP 104* 111* 126*   Imaging I reviewed CXR myself, no acute disease noted  ASSESSMENT /  PLAN:  PULMONARY - Titrate O2 for sat of 88-92% - Monitor for airway protection - DNR  CARDIOVASCULAR Likely a combination of adrenal insufficiency and heart failure, no signs of infection to suspect septic shock. - Stress dose steroids to be continued but switched to PO - D/C neo - Midodrine maintained at current dose  RENAL ESRD - Attempt HD per renal - BMET in AM - Replace electrolytes as indicated  GASTROINTESTINAL No active issues, monitor Regular diet  HEMATOLOGIC A:   No active issues  INFECTIOUS A:   No indication for antibiotics  ENDOCRINE Stress dose steroids ordered  NEUROLOGIC Patient is mentating fine and he is DNR.  Transfer to SDU and to [facility] service, accept SBP of 80 as long  as patient is mentating, PCCM will be off 08/11/2017.  Rush Farmer, M.D. [facility] Pulmonary/Critical Care Medicine. Pager: [phone]. After hours pager: [phone].  08/10/2017, 10:42 AM

## 2017-08-10 NOTE — Progress Notes (Signed)
  [facility] Progress Note   Assessment/ Plan:   1. Hypotension - acute on chronic.  On midodrine 10 tid. Severe RHF on echo this admit.  Patient has severe cardiac disease (afib/ uncont HR/ failed ablation/ severe RHF/ pulm HTN/ NSVT, etc).  Pt had the following discussion with Dr. Jonnie Finner: he told him that from here forward any hemodialysis procedure for him would be considered high-risk and that we could continue to try to dialyze him but w/ the condition of DNR status. Also gave him the option of hospice. Discussed all this w/ the wife present.  He declined hospice, agreed to DNR status and continued HD.  He is now also on stress-dose steroids.  VVS has been consulted, ligating fistula today, have d/w Dr. Oneida Alar.  Greatly appreciate their assistance  2. Volume- Vol up, will see if we can remove fluid with IHD today. 3. ESRD - HD MWF, off schedule now, dialyzed yesterday, again tomorrow 12/6 4. COPD/ pulm HTN- on home O2 5. Aflutter / fib -sinus tach on monitor hx failed ablation, have decided to forward with amiodarone.  Cardiology following. 6. Possible cellulitis L arm; off antibiotics, per PCCM 7. Anemia of CKD - no esa needed, Hgb 11.7 8. MBD - cont meds 9. CAD hx CABG/ DES 10. Nutrition - add multivit alb 2.9   Subjective:    For fistula ligation today.  Tolerated HD well yesterday.     Objective:   BP (!) 87/76   Pulse (!) 113   Temp 97.7 F (36.5 C) (Oral)   Resp 17   Ht 5\' 10"  (1.778 m)   Wt 86.9 kg (191 lb 9.3 oz)   SpO2 99%   BMI 27.49 kg/m   Physical Exam: General: NAD, talkative, sitting up in bed.   Heart: tachycardic, regular Lungs: grossly clear Abdomen:soft NT Extremities: burnished skin lower extremities, no frank edema but dependent edema sacrum Dialysis Access: right IJ TDC in place; L RC AVF clotted, new LUE AVF + T/B with well-approximated incision, erythema essentially resolved.   Labs: BMET Recent Labs  Lab 08/05/17 0437  08/08/17 0325 08/09/17 0245 08/10/17 0235  NA 137 140 137 136  K 3.8 3.8 4.3 5.2*  CL 102 107 99* 96*  CO2 23 23 18* 23  GLUCOSE 91 114* 97 127*  BUN 32* 47* 63* 48*  CREATININE 5.53* 6.24* 7.52* 5.87*  CALCIUM 8.2* 5.9* 6.8* 7.0*  PHOS 3.9  --  6.8* 5.3*   CBC Recent Labs  Lab 08/07/17 0403 08/08/17 0325 08/09/17 0245 08/10/17 0235  WBC 6.6 9.8 9.9 9.8  HGB 11.5* 10.2* 12.9* 11.9*  HCT 35.3* 32.1* 39.0 36.7*  MCV 110.0* 107.7* 106.8* 106.7*  PLT 82* 69* 104* 108*    @IMGRELPRIORS @ Medications:    . amiodarone  400 mg Oral BID  . arformoterol  15 mcg Nebulization BID  . atorvastatin  20 mg Oral QHS  . Chlorhexidine Gluconate Cloth  6 each Topical Daily  . citalopram  20 mg Oral QHS  . hydrocortisone sodium succinate  50 mg Intravenous Q6H  . midodrine  10 mg Oral TID  . multivitamin  1 tablet Oral QHS  . pantoprazole  40 mg Oral BID  . umeclidinium bromide  1 puff Inhalation Daily     Madelon Lips, MD [place] pgr [phone] 08/10/2017, 9:16 AM

## 2017-08-10 NOTE — Op Note (Signed)
Procedure: Ligation left arm AV fistula  Preoperative diagnosis: Congestive heart failure. Postoperative diagnosis: Same  Anesthesia: Local  Asst.: Nurse  Operative findings: Left brachiocephalic AV fistula ligated in 3 separate locations with 0 silk tie  Operative details: After obtaining informed consent, the patient was taken the operating. Patient was placed in supine position operating table. The left upper chest was prepped and draped in usual sterile fashion. The last seizure was infiltrated pre-existing scar near the antecubital crease. The incision was reopened and carried down through the subcutaneous tissues down to level of a AV fistula. This was dissected free circumferentially. It was then ligated in 3 separate adjacent locations just above the anastomosis with a 0 silk tie. There was no further flow within the fistula. The patient had a palpable radial pulse. The incision was then reapproximated using interrupted 30 vertical mattress and simple 3-0 nylon sutures. The patient tolerated the procedure well and there were no complications. Instrument sponge and needle counts correct in the case. The patient was taken to the recovery room in stable condition.  Ruta Hinds, MD [facility] [place] Office: [phone] Pager: [phone]

## 2017-08-10 NOTE — Progress Notes (Addendum)
Progress Note  Patient Name: [name] Date of Encounter: 08/10/2017  Primary Cardiologist: Dr Rayann Heman  Subjective   Mild dyspnea; no chest pain  Inpatient Medications    Scheduled Meds: . amiodarone  400 mg Oral BID  . arformoterol  15 mcg Nebulization BID  . atorvastatin  20 mg Oral QHS  . Chlorhexidine Gluconate Cloth  6 each Topical Daily  . citalopram  20 mg Oral QHS  . midodrine  10 mg Oral TID  . multivitamin  1 tablet Oral QHS  . pantoprazole  40 mg Oral BID  . predniSONE  20 mg Oral Q breakfast  . umeclidinium bromide  1 puff Inhalation Daily   Continuous Infusions: . sodium chloride    . sodium chloride    . ferric gluconate (FERRLECIT/NULECIT) IV Stopped (08/03/17 1235)  . phenylephrine (NEO-SYNEPHRINE) Adult infusion Stopped (08/09/17 1700)   PRN Meds: sodium chloride, sodium chloride, acetaminophen **OR** acetaminophen, albuterol, alteplase, heparin, lidocaine (PF), lidocaine-prilocaine, pentafluoroprop-tetrafluoroeth, sodium chloride flush   Vital Signs    Vitals:   08/10/17 0900 08/10/17 0945 08/10/17 1000 08/10/17 1045  BP: (!) 86/59 (!) 81/70 (!) 81/70   Pulse: (!) 115 (!) 114 (!) 114   Resp: (!) 21 15 18    Temp: 97.8 F (36.6 C) 97.8 F (36.6 C)  (!) 97.3 F (36.3 C)  TempSrc: Oral Oral  Oral  SpO2: 96% 97% 100%   Weight:      Height:        Intake/Output Summary (Last 24 hours) at 08/10/2017 1113 Last data filed at 08/10/2017 1040 Gross per 24 hour  Intake 1287.5 ml  Output 2500 ml  Net -1212.5 ml   Filed Weights   08/09/17 0905 08/09/17 1252 08/10/17 0500  Weight: 194 lb 3.6 oz (88.1 kg) 188 lb 15 oz (85.7 kg) 191 lb 9.3 oz (86.9 kg)    Telemetry    Atrial fibrillation/flutter with RVR; rate improving- Personally Reviewed   Physical Exam   GEN: WD, chronically ill appearing; no distress Neck: No JVD Cardiac: irregular and tachycardic Respiratory: mild basilar crackles GI: Soft, diffuse abdominal wall [place] MS: chronic  skin changes, 1-2+ thigh edema Neuro:  No focal findings   Labs    Chemistry Recent Labs  Lab 08/05/17 0437 08/08/17 0325 08/09/17 0245 08/10/17 0235  NA 137 140 137 136  K 3.8 3.8 4.3 5.2*  CL 102 107 99* 96*  CO2 23 23 18* 23  GLUCOSE 91 114* 97 127*  BUN 32* 47* 63* 48*  CREATININE 5.53* 6.24* 7.52* 5.87*  CALCIUM 8.2* 5.9* 6.8* 7.0*  PROT  --  5.8*  --   --   ALBUMIN 2.9* 2.3*  --   --   AST  --  40  --   --   ALT  --  9*  --   --   ALKPHOS  --  98  --   --   BILITOT  --  1.0  --   --   GFRNONAA 10* 9* 7* 9*  GFRAA 12* 10* 8* 11*  ANIONGAP 12 10 20* 17*     Hematology Recent Labs  Lab 08/08/17 0325 08/09/17 0245 08/10/17 0235  WBC 9.8 9.9 9.8  RBC 2.98* 3.65* 3.44*  HGB 10.2* 12.9* 11.9*  HCT 32.1* 39.0 36.7*  MCV 107.7* 106.8* 106.7*  MCH 34.2* 35.3* 34.6*  MCHC 31.8 33.1 32.4  RDW 18.5* 18.8* 18.8*  PLT 69* 104* 108*       Patient Profile  61 year old gentleman with a history of atrial flutter, severe mitral regurgitation, chronic kidney disease. He has been having hypotension and dialysis recently. The symptoms seem to have worsened after he had his most recent AV fistula placed    Assessment & Plan    1 Atrial fibrillation/futter-HR improving; continue amiodarone as outlined previously; BP will not allow other AV nodal blocking agents. Not a candidate for further attempts at ablation (discussed with Dr Rayann Heman previously). BP will not allow ca blocker or beta blocker. Would resume coumadin following surgery.  2 Hypotension-neo now off; continue midodrine. Decreased BP felt secondary to pulmonary hypertension and RV failure.  3 ESRD-Dialysis per nephrology  4 COPD/Lung cancer  5 NCB; pt has declined hospice per notes.   For questions or updates, please contact [place] Please consult [email or web address] for contact info under Cardiology/STEMI.      Signed, Kirk Ruths, MD  08/10/2017, 11:13 AM

## 2017-08-10 NOTE — Anesthesia Preprocedure Evaluation (Addendum)
Anesthesia Evaluation  Patient identified by MRN, date of birth, ID band Patient awake    Reviewed: Allergy & Precautions, NPO status   Airway Mallampati: III  TM Distance: <3 FB Neck ROM: limited    Dental  (+) Poor Dentition   Pulmonary shortness of breath, COPD, former smoker,    + rhonchi        Cardiovascular hypertension, + CAD, + Past MI, + Peripheral Vascular Disease, +CHF and + Orthopnea  + dysrhythmias  Rhythm:regular Rate:Tachycardia  R heart failure, hypotension , A flutter    Neuro/Psych    GI/Hepatic GERD  ,  Endo/Other    Renal/GU ESRFRenal disease     Musculoskeletal   Abdominal   Peds  Hematology  (+) anemia ,   Anesthesia Other Findings   Reproductive/Obstetrics                            Anesthesia Physical Anesthesia Plan  ASA: IV  Anesthesia Plan: MAC   Post-op Pain Management:    Induction: Intravenous  PONV Risk Score and Plan: 1 and Treatment may vary due to age or medical condition  Airway Management Planned: Natural Airway and Simple Face Mask  Additional Equipment:   Intra-op Plan:   Post-operative Plan:   Informed Consent: I have reviewed the patients History and Physical, chart, labs and discussed the procedure including the risks, benefits and alternatives for the proposed anesthesia with the patient or authorized representative who has indicated his/her understanding and acceptance.     Plan Discussed with: CRNA  Anesthesia Plan Comments:         Anesthesia Quick Evaluation

## 2017-08-11 ENCOUNTER — Encounter (HOSPITAL_COMMUNITY): Payer: Self-pay | Admitting: Vascular Surgery

## 2017-08-11 LAB — BPAM FFP
BLOOD PRODUCT EXPIRATION DATE: 201812092359
BLOOD PRODUCT EXPIRATION DATE: 201812092359
ISSUE DATE / TIME: 201812051039
ISSUE DATE / TIME: 201812050831
UNIT TYPE AND RH: 6200
Unit Type and Rh: 6200

## 2017-08-11 LAB — PREPARE FRESH FROZEN PLASMA
UNIT DIVISION: 0
Unit division: 0

## 2017-08-11 LAB — BASIC METABOLIC PANEL
ANION GAP: 17 — AB (ref 5–15)
BUN: 64 mg/dL — ABNORMAL HIGH (ref 6–20)
CALCIUM: 7 mg/dL — AB (ref 8.9–10.3)
CO2: 23 mmol/L (ref 22–32)
Chloride: 97 mmol/L — ABNORMAL LOW (ref 101–111)
Creatinine, Ser: 6.79 mg/dL — ABNORMAL HIGH (ref 0.61–1.24)
GFR, EST AFRICAN AMERICAN: 9 mL/min — AB (ref >60)
GFR, EST NON AFRICAN AMERICAN: 8 mL/min — AB (ref >60)
GLUCOSE: 93 mg/dL (ref 65–99)
POTASSIUM: 4.7 mmol/L (ref 3.5–5.1)
Sodium: 137 mmol/L (ref 135–145)

## 2017-08-11 LAB — CBC
HEMATOCRIT: 36.9 % — AB (ref 39.0–52.0)
Hemoglobin: 12 g/dL — ABNORMAL LOW (ref 13.0–17.0)
MCH: 34.5 pg — ABNORMAL HIGH (ref 26.0–34.0)
MCHC: 32.5 g/dL (ref 30.0–36.0)
MCV: 106 fL — AB (ref 78.0–100.0)
Platelets: 133 10*3/uL — ABNORMAL LOW (ref 150–400)
RBC: 3.48 MIL/uL — AB (ref 4.22–5.81)
RDW: 18.6 % — AB (ref 11.5–15.5)
WBC: 9.3 10*3/uL (ref 4.0–10.5)

## 2017-08-11 LAB — PROTIME-INR
INR: 1.67
Prothrombin Time: 19.5 seconds — ABNORMAL HIGH (ref 11.4–15.2)

## 2017-08-11 LAB — PHOSPHORUS: PHOSPHORUS: 5.7 mg/dL — AB (ref 2.5–4.6)

## 2017-08-11 LAB — MAGNESIUM: Magnesium: 2.1 mg/dL (ref 1.7–2.4)

## 2017-08-11 MED ORDER — WARFARIN SODIUM 5 MG PO TABS
5.0000 mg | ORAL_TABLET | Freq: Once | ORAL | Status: AC
  Administered 2017-08-11: 5 mg via ORAL
  Filled 2017-08-11: qty 1

## 2017-08-11 MED ORDER — WARFARIN - PHARMACIST DOSING INPATIENT
Freq: Every day | Status: DC
  Administered 2017-08-11: 23:00:00

## 2017-08-11 NOTE — Progress Notes (Signed)
  Progress Note    08/11/2017 9:33 AM 1 Day Post-Op  Subjective:  Sitting up eating breakfast; no complaints  Afebrile   Vitals:   08/11/17 0814 08/11/17 0816  BP:    Pulse:    Resp:    Temp:    SpO2: 97% 96%    Physical Exam: General:  NAD Lungs:  Non labored Incisions:  Bandage removed-antecubital incision looks good with nylon sutures in place; small skin tear down by the wrist.   CBC    Component Value Date/Time   WBC 9.3 08/11/2017 0221   RBC 3.48 (L) 08/11/2017 0221   HGB 12.0 (L) 08/11/2017 0221   HGB 12.8 (L) 12/15/2016 1558   HCT 36.9 (L) 08/11/2017 0221   HCT 39.1 12/15/2016 1558   PLT 133 (L) 08/11/2017 0221   PLT 139 (L) 12/15/2016 1558   MCV 106.0 (H) 08/11/2017 0221   MCV 101 (H) 12/15/2016 1558   MCH 34.5 (H) 08/11/2017 0221   MCHC 32.5 08/11/2017 0221   RDW 18.6 (H) 08/11/2017 0221   RDW 15.8 (H) 12/15/2016 1558   LYMPHSABS 0.5 (L) 12/15/2016 1558   MONOABS 0.7 11/15/2016 1550   EOSABS 0.1 12/15/2016 1558   BASOSABS 0.0 12/15/2016 1558    BMET    Component Value Date/Time   NA 137 08/11/2017 0221   NA 141 12/15/2016 1558   K 4.7 08/11/2017 0221   CL 97 (L) 08/11/2017 0221   CO2 23 08/11/2017 0221   GLUCOSE 93 08/11/2017 0221   BUN 64 (H) 08/11/2017 0221   BUN 31 (H) 12/15/2016 1558   CREATININE 6.79 (H) 08/11/2017 0221   CALCIUM 7.0 (L) 08/11/2017 0221   GFRNONAA 8 (L) 08/11/2017 0221   GFRAA 9 (L) 08/11/2017 0221    INR    Component Value Date/Time   INR 1.67 08/11/2017 0221     Intake/Output Summary (Last 24 hours) at 08/11/2017 0933 Last data filed at 08/11/2017 0000 Gross per 24 hour  Intake 1237.5 ml  Output 5 ml  Net 1232.5 ml     Assessment:  61 y.o. male is s/p:  Ligation of left arm AV fistula  1 Day Post-Op  Plan: -bandage removed and incision looks good.  Small wet to dry saline dressing placed to small skin tear down by the wrist and Telfa with 4x4 gauze placed over antecubital incision and Kerlix and 4"  ace placed on left arm    Leontine Locket, PA-C [facility] [phone] 08/11/2017 9:33 AM

## 2017-08-11 NOTE — Progress Notes (Signed)
ANTICOAGULATION CONSULT NOTE  Pharmacy Consult:  Coumadin Indication: atrial fibrillation  Allergies  Allergen Reactions  . Adhesive [Tape] Rash    Please use paper tape  . Amoxicillin Rash    migraine  . Ativan [Lorazepam] Anxiety  . Bupropion Anxiety  . Diphenhydramine Itching and Anxiety    Only with IV doses. Tolerates oral.  . Penicillins Other (See Comments)    MIGRAINE  Has patient had a PCN reaction causing immediate rash, facial/tongue/throat swelling, SOB or lightheadedness with hypotension: no Has patient had a PCN reaction causing severe rash involving mucus membranes or skin necrosis: No Has patient had a PCN reaction that required hospitalization No Has patient had a PCN reaction occurring within the last 10 years: No If all of the above answers are "NO", then may proceed with Cephalosporin use.    Patient Measurements: Height: 5\' 10"  (177.8 cm) Weight: 191 lb 9.3 oz (86.9 kg) IBW/kg (Calculated) : 73  Vital Signs: Temp: 97.6 F (36.4 C) (12/06 1145) Temp Source: Oral (12/06 1145) BP: 99/80 (12/06 1100) Pulse Rate: 111 (12/06 1100)  Labs: Recent Labs    08/09/17 0245 08/10/17 0235 08/11/17 0221  HGB 12.9* 11.9* 12.0*  HCT 39.0 36.7* 36.9*  PLT 104* 108* 133*  LABPROT 24.8* 25.5* 19.5*  INR 2.26 2.34 1.67  CREATININE 7.52* 5.87* 6.79*    Estimated Creatinine Clearance: 11.6 mL/min (A) (by C-G formula based on SCr of 6.79 mg/dL (H)).   Medical History: Past Medical History:  Diagnosis Date  . Acute blood loss anemia    a. 02/2016 due to groin hematoma. Rec 3 U PRBC.  Marland Kitchen Anemia   . Anxiety   . Aortic heart valve prolapse 04/2013   a. s/p bioprosthetic AVR at time of CABG.  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 Bifascicular block   . CAD (coronary artery disease) 04/2013   a. 04/2013: s/p CABG x 2 (Y SVG -LAD & D2). b. 01/2015: NSTEMI s/p DES to LCx, BMS to SVG-LAD. c. NSTEMI 05/2015 during AF/AFL - non-flow limiting FFR. d. Low  risk nuc 3/17. e. STEMI 6/17 after coming off Plavix, s/p DES to SVG-LAD into native vessel.  . Cancer ([place])    lung cancer - 5 doses of radiation, F/U in Dec. 2018  . Carotid artery disease ([facility])    a. 1-39% stenosis bilaterally in 06/2015.   Marland Kitchen CHF (congestive heart failure) ([place])   . Chronic respiratory failure ([place])   . COPD (chronic obstructive pulmonary disease) ([place])   . Dyspnea   . 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 b. He had a renal Tx from 2008 to 2011, but subsequent rejection - Gets HD in [place] [employer], [place] on MWF schedule.    Marland Kitchen GERD (gastroesophageal reflux disease)   . Hematoma    a. Large right groin hematoma after cath 02/2016 with associated ABL anemia.  . Hypertension   . Multiple sclerosis ([place])   . Myocardial infarction ([place])   . Nocturnal hypoxemia   . On home oxygen therapy    pt states he has not been wearing it  . PAF (paroxysmal atrial fibrillation) ([place])    a. h/o, placed on amiodarone 07/2016 due to recurrence.  . Paroxysmal atrial flutter ([place])    a. During 05/2015 admission - SVT, atrial flutter, and PAF.  Marland Kitchen Peripheral vascular disease ([place])    Cath in 01/2015 required 45 cm Destination Sheath  . Pneumonia   .  Renal transplant failure and rejection   . 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 intolerant of bipap. Sleeps with O2 at 2 L  . SVT (supraventricular tachycardia) ([place])   . Valvular heart disease    a. 2D Echo 03/25/16: mild LVH, EF 50-55%, grade 2 Dd, AVR present with mild AI, mod MR, severely dilated LA, mildly dilated RV, mod RAE, PASP 72.     Assessment: [name] with history of AFib on Coumadin PTA.  Patient was reversed with Vitamin K 10mg  IV on 08/10/17 for fistula ligation.  Spoke to Vascular, okay to resume Coumadin today 08/11/17.  Patient's INR was supra-therapeutic on admit; currently sub-therapeutic at 1.67.  No bleeding reported.  Home regimen: 3.75mg  PO  daily except 7.5mg  on Mon   Goal of Therapy:  INR 2-3 Monitor platelets by anticoagulation protocol: Yes    Plan:  Coumadin 5mg  PO today Daily PT / INR   Rosalynd Mcwright D. Mina Marble, PharmD, BCPS Pager:  [phone] 08/11/2017, 1:35 PM

## 2017-08-11 NOTE — Progress Notes (Signed)
  [name] Progress Note   Assessment/ Plan:   1. Hypotension - acute on chronic.  On midodrine 10 tid. Severe RHF on echo this admit.  Patient has severe cardiac disease (afib/ uncont HR/ failed ablation/ severe RHF/ pulm HTN/ NSVT, etc).  Pt had the following discussion with Dr. Jonnie Finner: he told him that from here forward any hemodialysis procedure for him would be considered high-risk and that we could continue to try to dialyze him but w/ the condition of DNR status. Also gave him the option of hospice. Discussed all this w/ the wife present.  He declined hospice, agreed to DNR status and continued HD.  S/p stress dose steroids --> prednisone.  S/p ligation of fistula with Dr Oneida Alar 12/5, greatly appreciate assistance 2. Volume- still vol up, HD with fluid removal 3. ESRD - HD MWF, off schedule, TTS while here 4. COPD/ pulm HTN- on home O2 5. Aflutter / fib -sinus tach on monitor hx failed ablation, have decided to forward with amiodarone.  Cardiology following. 6. Possible cellulitis L arm; off antibiotics, per PCCM 7. Anemia of CKD - no esa needed, Hgb 11.7 8. MBD - cont meds 9. CAD hx CABG/ DES 10. Nutrition - add multivit alb 2.9   Subjective:    Fistula ligation yesterday.  hemodyamics improved.  Off all pressor   Objective:   BP 107/87   Pulse (!) 110   Temp 98.3 F (36.8 C) (Oral)   Resp 17   Ht 5\' 10"  (1.778 m)   Wt 86.9 kg (191 lb 9.3 oz)   SpO2 94%   BMI 27.49 kg/m   Physical Exam: General: NAD, talkative, sitting up in bed.   Heart: tachycardic, regular Lungs: grossly clear Abdomen:soft NT Extremities: burnished skin lower extremities, no frank pretibial edema but dependent edema sacrum Dialysis Access: right IJ TDC in place; L RC AVF clotted, new LUE AVF ligated   Labs: BMET Recent Labs  Lab 08/05/17 0437 08/08/17 0325 08/09/17 0245 08/10/17 0235 08/11/17 0221  NA 137 140 137 136 137  K 3.8 3.8 4.3 5.2* 4.7  CL 102 107 99* 96* 97*   CO2 23 23 18* 23 23  GLUCOSE 91 114* 97 127* 93  BUN 32* 47* 63* 48* 64*  CREATININE 5.53* 6.24* 7.52* 5.87* 6.79*  CALCIUM 8.2* 5.9* 6.8* 7.0* 7.0*  PHOS 3.9  --  6.8* 5.3* 5.7*   CBC Recent Labs  Lab 08/08/17 0325 08/09/17 0245 08/10/17 0235 08/11/17 0221  WBC 9.8 9.9 9.8 9.3  HGB 10.2* 12.9* 11.9* 12.0*  HCT 32.1* 39.0 36.7* 36.9*  MCV 107.7* 106.8* 106.7* 106.0*  PLT 69* 104* 108* 133*    @IMGRELPRIORS @ Medications:    . amiodarone  400 mg Oral BID  . arformoterol  15 mcg Nebulization BID  . atorvastatin  20 mg Oral QHS  . Chlorhexidine Gluconate Cloth  6 each Topical Daily  . citalopram  20 mg Oral QHS  . midodrine  10 mg Oral TID  . multivitamin  1 tablet Oral QHS  . pantoprazole  40 mg Oral BID  . predniSONE  20 mg Oral Q breakfast  . umeclidinium bromide  1 puff Inhalation Daily     Madelon Lips, MD [place] pgr [phone] 08/11/2017, 7:59 AM

## 2017-08-11 NOTE — Progress Notes (Signed)
Palliative f/u. Chart reviewed. Patient awake, alert, oriented. Denies pain or discomfort. Feels better. S/p fistula ligation yesterday. Off pressors. PCCM signing off today. Patient relieved that he is off pressors and closer to going home. May benefit from continued f/u from palliative services outpatient.   NO CHARGE  Ihor Dow, FNP-C Palliative Medicine Team  Phone: [phone] Fax: [phone]

## 2017-08-11 NOTE — Progress Notes (Signed)
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 New left brachial AV fistula placed approximately 3 weeks ago and has been complaining of some lightheadedness since the procedure when standing and during ambulation. Pt now s/p AV fistula placement on 12/5.   OT comments  Pt making steady progress towards OT goals this session. Pt continues to require 1HHA and min A for stability with transfers and ambulation for toilet transfer. Of note, pt's HR elevated to as high as the mid 140's with ambulation with SPO2 fluctuating between mid 80's and low 90's throughout on 4L of O2. Energy conservation education initiated this session. Pt would continue to benefit from skilled occupational therapy services at this time.   Follow Up Recommendations  No OT follow up;Supervision/Assistance - 24 hour    Equipment Recommendations  None recommended by OT(Pt has appropriate DME)    Recommendations for Other Services      Precautions / Restrictions Precautions Precautions: Fall Precaution Comments: watch HR, BP Restrictions Weight Bearing Restrictions: No       Mobility Bed Mobility Overal bed mobility: Needs Assistance Bed Mobility: Supine to Sit;Sit to Supine     Supine to sit: Supervision Sit to supine: Supervision   General bed mobility comments: no physical assist needed,   Transfers Overall transfer level: Needs assistance Equipment used: 1 person hand held assist Transfers: Sit to/from Stand Sit to Stand: Min assist         General transfer comment: assist needed for balance as initially pt was very unsteady in  standing    Balance Overall balance assessment: Needs assistance Sitting-balance support: Feet supported Sitting balance-Leahy Scale: Fair     Standing balance support: During functional activity;Single extremity supported Standing balance-Leahy Scale: Poor Standing balance comment: required at least 1HHA for static and dynamic                           ADL either performed or assessed with clinical judgement   ADL Overall ADL's : Needs assistance/impaired     Grooming: Wash/dry hands;Minimal assistance;Standing Grooming Details (indicate cue type and reason): min A for standing balance                 Toilet Transfer: Minimal assistance(1 HHA) Toilet Transfer Details (indicate cue type and reason): 1 person HHA for balance during moblity for transfer         Functional mobility during ADLs: Minimal assistance(1 HHA) General ADL Comments: Pt reports feeling much better this session after new fistula     Vision       Perception     Praxis      Cognition Arousal/Alertness: Awake/alert Behavior During Therapy: WFL for tasks assessed/performed Overall Cognitive Status: Within Functional Limits for tasks assessed                                          Exercises     Shoulder Instructions       General Comments      Pertinent Vitals/ Pain  Pain Assessment: No/denies pain  Home Living                                          Prior Functioning/Environment              Frequency  Min 3X/week        Progress Toward Goals  OT Goals(current goals can now be found in the care plan section)  Progress towards OT goals: Progressing toward goals  Acute Rehab OT Goals Patient Stated Goal: Play golf again OT Goal Formulation: With patient/family Time For Goal Achievement: 08/18/17 Potential to Achieve Goals: Good  Plan Discharge plan remains appropriate;Frequency remains appropriate     Co-evaluation    PT/OT/SLP Co-Evaluation/Treatment: Yes Reason for Co-Treatment: For patient/therapist safety;To address functional/ADL transfers PT goals addressed during session: Mobility/safety with mobility OT goals addressed during session: ADL's and self-care      AM-PAC PT "6 Clicks" Daily Activity     Outcome Measure   Help from another person eating meals?: None Help from another person taking care of personal grooming?: A Little Help from another person toileting, which includes using toliet, bedpan, or urinal?: A Little Help from another person bathing (including washing, rinsing, drying)?: A Little Help from another person to put on and taking off regular upper body clothing?: None Help from another person to put on and taking off regular lower body clothing?: A Little 6 Click Score: 20    End of Session Equipment Utilized During Treatment: Gait belt;Oxygen(4L)  OT Visit Diagnosis: Unsteadiness on feet (R26.81);Other abnormalities of gait and mobility (R26.89);Muscle weakness (generalized) (M62.81)   Activity Tolerance Patient tolerated treatment well   Patient Left in bed;with call bell/phone within reach;with nursing/sitter in room(HD nurse coming in)   Nurse Communication Mobility status        Time: 7510-2585 OT Time Calculation (min): 24 min  Charges: OT General Charges $OT Visit: 1 Visit OT Treatments $Self Care/Home Management : 8-22 mins  Hulda Humphrey OTR/L [place] 08/11/2017, 1:32 PM

## 2017-08-11 NOTE — Progress Notes (Signed)
   Subjective:   Brief transfer summary: Pt was on critical care service and was on neo pressors which was stopped yesterday. He underwent ligation of the fistula yesterday.   Overnight, no acute events. Pt denies any pain, nausea, vomiting, and was resting comfortably.   Objective:  Vital signs in last 24 hours: Vitals:   08/11/17 0344 08/11/17 0400 08/11/17 0500 08/11/17 0600  BP:  (!) 110/91 106/88 100/86  Pulse:  (!) 111 (!) 113 (!) 113  Resp:  19 15 17   Temp: 98.4 F (36.9 C)     TempSrc: Oral     SpO2:  95% 97% 92%  Weight:      Height:       General: NAD CV: Tachycardic, 2/6 systolic murmur Resp: CTAB with decreased breath sounds at bases, no increased work of breathing  Abd: Soft, +BS, non-tender  Extr: Ted hose in place, no LE edema Skin: left arm was bandaged where surgery happened    Assessment/Plan:  Principal Problem:   Orthostatic hypotension Active Problems:   Multiple sclerosis (HCC)   End-stage renal disease (HCC)   CAD of artery bypass graft-occluded SVG-LAD 02/15/16   Typical atrial flutter (HCC)   H/O aortic valve replacement   COPD GOLD III    Chronic diastolic congestive heart failure (HCC)   Palliative care by specialist   Persistent atrial fibrillation (HCC)   Pressure injury of skin  Symptomatic Hypotension in setting of HFrEF, Mitral regurg, RV failure, and newly placed AV fistula: Patient has history of chronic hypotension was on midodrine, baseline pressures are around 14N-829F systolic.  Presented with a further decrease in blood pressure to systolics of 62Z. Echo showed severe RV dilation and decreased function. Palliative care discussions introduced by Nephrology and IM team and patient wanted full scope of care earlier but was switched to DNR status on December 2nd. He then had episodes of tachycardia and on December 3rd, Critical care was consulted who started the patient on neo drip and stress dose steroids. Decision then was made to  ligate the AV fistula. He underwent that on Dec 5th. Neo drip was stopped on December 5th.   -continue prednisone -continue midodrine    ESRD s/p recent LUE AVF placement- Patient has history of ESRD on MWF HD. Vascular surgery consulted and he underwent ligation of avf fistula. Renal had discussed with him that that any dialysis procedure is considered high risk for him and they recommended him to be DNR.   -renal following and appreciate recs , will be dialyzed again today  -he in DNR now   Chronic Atrial Flutter Patient has a history of chronic atrial flutter with multiple cardioversions and failed ablation at [facility]. Was on metoprolol 12.5 twice daily for rate control which is limited due to hypotension.  Also on warfarin for anticoagulation (GI bleed in the past).  Cardiology consulted and they felt that he was not a candidate for further attempts at ablation, and blood pressure will not allow a calcium channel blocker, or a beta blocker. Given his ESRD, digoxin has increased toxicity. Amiodarone was started on December 3rd- 400 mg bid for 1 week and then 200 mg daily thereafter.   -continue coumadin -Continue amiodarone    Dispo: Anticipated discharge in approximately 1-2 day(s).   Burgess Estelle, MD 08/11/2017, 7:13 AM

## 2017-08-11 NOTE — Anesthesia Postprocedure Evaluation (Signed)
Anesthesia Post Note  Patient: [name]  Procedure(s) Performed: LIGATION OF ARTERIOVENOUS  FISTULA (Left Arm Lower)     Patient location during evaluation: PACU Anesthesia Type: MAC Level of consciousness: awake and alert Pain management: pain level controlled Vital Signs Assessment: post-procedure vital signs reviewed and stable Respiratory status: spontaneous breathing, nonlabored ventilation, respiratory function stable and patient connected to nasal cannula oxygen Cardiovascular status: stable and blood pressure returned to baseline Postop Assessment: no apparent nausea or vomiting Anesthetic complications: no    Last Vitals:  Vitals:   08/11/17 0814 08/11/17 0816  BP:    Pulse:    Resp:    Temp:    SpO2: 97% 96%    Last Pain:  Vitals:   08/11/17 0715  TempSrc: Oral  PainSc:                  [name]

## 2017-08-11 NOTE — Progress Notes (Signed)
Physical Therapy Treatment Patient Details Name: [name] MRN: [id number] DOB: Feb 26, 1956 Today's Date: 08/11/2017    History of Present Illness Patient is a 61 year old male with a past medical history of CAD status post two-vessel CABG in 2014, CHF, COPD on home O2, multiple sclerosis, squamous cell carcinoma of the lung status post radiation therapy, peripheral vascular disease and chronic hypotension. New left brachial AV fistula placed approximately 3 weeks ago and has been complaining of some lightheadedness since the procedure when standing and during ambulation. Pt now s/p AV fistula placement on 12/5.    PT Comments    Pt making steady progress with mobility and tolerated ambulating a further distance this session. Pt continues to require 1HHA and min A for stability with mobility. Of note, pt's HR elevated to as high as the mid 140's with ambulation with SPO2 fluctuating between mid 80's and low 90's throughout on 4L of O2. Pt would continue to benefit from skilled physical therapy services at this time while admitted and after d/c to address the below listed limitations in order to improve overall safety and independence with functional mobility.    Follow Up Recommendations  Outpatient PT;Supervision for mobility/OOB     Equipment Recommendations  None recommended by PT    Recommendations for Other Services       Precautions / Restrictions Precautions Precautions: Fall Precaution Comments: watch HR, BP Restrictions Weight Bearing Restrictions: No    Mobility  Bed Mobility Overal bed mobility: Needs Assistance Bed Mobility: Supine to Sit;Sit to Supine     Supine to sit: Supervision Sit to supine: Supervision   General bed mobility comments: no physical assist needed,   Transfers Overall transfer level: Needs assistance Equipment used: 1 person hand held assist Transfers: Sit to/from Stand Sit to Stand: Min assist         General transfer comment:  assist needed for balance as initially pt was very unsteady in standing  Ambulation/Gait Ambulation/Gait assistance: Min assist Ambulation Distance (Feet): 100 Feet Assistive device: 1 person hand held assist Gait Pattern/deviations: Step-through pattern;Decreased stride length;Shuffle;Narrow base of support Gait velocity: decreased Gait velocity interpretation: Below normal speed for age/gender General Gait Details: pt with modest instability with ambulation with 1HHA and constant min A; HR elevated to as high as 143 bpm during ambulation   Stairs            Wheelchair Mobility    Modified Rankin (Stroke Patients Only)       Balance Overall balance assessment: Needs assistance Sitting-balance support: Feet supported Sitting balance-Leahy Scale: Fair     Standing balance support: During functional activity;Single extremity supported Standing balance-Leahy Scale: Poor Standing balance comment: required at least 1HHA for static and dynamic                            Cognition Arousal/Alertness: Awake/alert Behavior During Therapy: WFL for tasks assessed/performed Overall Cognitive Status: Within Functional Limits for tasks assessed                                        Exercises      General Comments        Pertinent Vitals/Pain Pain Assessment: No/denies pain    Home Living                      Prior  Function            PT Goals (current goals can now be found in the care plan section) Acute Rehab PT Goals PT Goal Formulation: With patient/family Time For Goal Achievement: 08/11/17 Potential to Achieve Goals: Good Progress towards PT goals: Progressing toward goals    Frequency    Min 3X/week      PT Plan Current plan remains appropriate    Co-evaluation PT/OT/SLP Co-Evaluation/Treatment: Yes Reason for Co-Treatment: For patient/therapist safety;To address functional/ADL transfers PT goals addressed  during session: Mobility/safety with mobility;Balance;Strengthening/ROM        AM-PAC PT "6 Clicks" Daily Activity  Outcome Measure  Difficulty turning over in bed (including adjusting bedclothes, sheets and blankets)?: None Difficulty moving from lying on back to sitting on the side of the bed? : None Difficulty sitting down on and standing up from a chair with arms (e.g., wheelchair, bedside commode, etc,.)?: Unable Help needed moving to and from a bed to chair (including a wheelchair)?: A Little Help needed walking in hospital room?: A Little Help needed climbing 3-5 steps with a railing? : A Lot 6 Click Score: 17    End of Session Equipment Utilized During Treatment: Gait belt;Oxygen(4L of O2 via [place]) Activity Tolerance: Patient tolerated treatment well Patient left: in bed;with call bell/phone within reach;Other (comment)(dialysis RN present) Nurse Communication: Mobility status PT Visit Diagnosis: Other symptoms and signs involving the nervous system (R29.898);Muscle weakness (generalized) (M62.81)     Time: 1583-0940 PT Time Calculation (min) (ACUTE ONLY): 23 min  Charges:  $Gait Training: 8-22 mins                    G Codes:       [place], [place], [place] 08/11/2017, 12:22 PM

## 2017-08-12 LAB — CBC
HEMATOCRIT: 36.2 % — AB (ref 39.0–52.0)
Hemoglobin: 11.6 g/dL — ABNORMAL LOW (ref 13.0–17.0)
MCH: 33.6 pg (ref 26.0–34.0)
MCHC: 32 g/dL (ref 30.0–36.0)
MCV: 104.9 fL — ABNORMAL HIGH (ref 78.0–100.0)
PLATELETS: 104 10*3/uL — AB (ref 150–400)
RBC: 3.45 MIL/uL — ABNORMAL LOW (ref 4.22–5.81)
RDW: 18.3 % — AB (ref 11.5–15.5)
WBC: 7.9 10*3/uL (ref 4.0–10.5)

## 2017-08-12 LAB — RENAL FUNCTION PANEL
ANION GAP: 13 (ref 5–15)
Albumin: 2.9 g/dL — ABNORMAL LOW (ref 3.5–5.0)
BUN: 44 mg/dL — AB (ref 6–20)
CO2: 24 mmol/L (ref 22–32)
Calcium: 6.8 mg/dL — ABNORMAL LOW (ref 8.9–10.3)
Chloride: 100 mmol/L — ABNORMAL LOW (ref 101–111)
Creatinine, Ser: 5.96 mg/dL — ABNORMAL HIGH (ref 0.61–1.24)
GFR calc Af Amer: 11 mL/min — ABNORMAL LOW (ref >60)
GFR calc non Af Amer: 9 mL/min — ABNORMAL LOW (ref >60)
GLUCOSE: 107 mg/dL — AB (ref 65–99)
PHOSPHORUS: 4.5 mg/dL (ref 2.5–4.6)
Potassium: 4 mmol/L (ref 3.5–5.1)
SODIUM: 137 mmol/L (ref 135–145)

## 2017-08-12 LAB — PROTIME-INR
INR: 1.56
PROTHROMBIN TIME: 18.6 s — AB (ref 11.4–15.2)

## 2017-08-12 LAB — GLUCOSE, CAPILLARY: GLUCOSE-CAPILLARY: 101 mg/dL — AB (ref 65–99)

## 2017-08-12 MED ORDER — AMIODARONE HCL 200 MG PO TABS
200.0000 mg | ORAL_TABLET | Freq: Every day | ORAL | Status: DC
  Administered 2017-08-12: 200 mg via ORAL
  Filled 2017-08-12: qty 1

## 2017-08-12 MED ORDER — WARFARIN SODIUM 5 MG PO TABS
5.0000 mg | ORAL_TABLET | Freq: Once | ORAL | Status: AC
  Administered 2017-08-13: 5 mg via ORAL
  Filled 2017-08-12: qty 1

## 2017-08-12 MED ORDER — AMIODARONE HCL 200 MG PO TABS: 200.0000 mg | ORAL_TABLET | Freq: Every day | ORAL | 0 refills | Status: AC

## 2017-08-12 NOTE — Progress Notes (Signed)
   Subjective: Patient states he was feeling well today, denies any chest pain or shortness of breath.  Had dialysis yesterday, denies dizziness.    Objective:  Vital signs in last 24 hours: Vitals:   08/12/17 0700 08/12/17 0825 08/12/17 0828 08/12/17 0849  BP: 101/72   94/83  Pulse: (!) 108   (!) 111  Resp: 19   17  Temp:    98.3 F (36.8 C)  TempSrc:    Oral  SpO2: 93% 94% 95% 91%  Weight:      Height:       General: Elderly male resting comfortably in bed, no acute distress Neck: supple CV: Tachycardic, 2/6 systolic murmur heard loudest in Tricuspid area Resp: Occasional faint wheeze, normal work of breathing, no distress  Abd: Soft, +BS, non-tender  Extr: no lower leg edema, 2+ thigh edema bilaterally Skin: Clean dressing over I&D site, no residual drainage, clean wound    Assessment/Plan:  Symptomatic Hypotension in setting of HFrEF, Mitral regurg, RV failure    Patient has history of chronic hypotension currently on midodrine, baseline pressures are around 67R-916B systolic.  RV failure, recent fistula placement  --Cont midodrine  --PT/OT --Fistula ligated by vascular, pt off pressors --Palliative care consulted, pt wants full scope of care at this time --Patient agreed to be DNR and will try to continue dialysis outpatient   ESRD s/p recent LUE AVF placement Patient has history of ESRD on MWF HD.  He did not receive HD prior to presentation due to hypotension. Pt had a LUE AVF recently placed  --Fistula ligated as thought to be contributing to hypotension --HD per nephrology, appreciate assistance  --completed dialysis yesterday with no issues. --Pt now DNR understands that continuing dialysis will be difficult, nephrology will continue his dialysis outpatient and feels he is stable enough for discharge today after dialysis session.  Chronic Atrial Flutter Patient has a history of chronic atrial flutter with multiple cardioversions and failed ablation at [facility].  On  warfarin, was on metoprolol at home for rate control, d/ced due to hypotension  -now on oral amiodarone 200mg  daily -continue warfarin    Dispo: Anticipated discharge later today after dialysis  Katherine Roan, MD 08/12/2017, 10:59 AM Pager: [phone]

## 2017-08-12 NOTE — Progress Notes (Addendum)
Telephone report called and given to [place], Therapist, sports. All questions addressed. Pt updated; belongings gathered. Transported by this RN to 3M13 in stable condition.

## 2017-08-12 NOTE — Progress Notes (Signed)
[name] Progress Note   Assessment/ Plan:    Dialysis Orders: [name] MWF 4h 77kg 2K/ 3Ca bath Hep none R IJ TDC / (maturing LUA AVF 07/19/17-now ligated 12/5) -venofer 50/wk  1. Hypotension - acute on chronic, due to high-output HF in setting of newly created fistula.  On midodrine 10 tid. Severe RHF on echo this admit.  Patient has severe cardiac disease (afib/ uncont HR/ failed ablation/ severe RHF/ pulm HTN/ NSVT, etc).  Pt had the following discussion with Dr. Jonnie Weiss: he told him that from here forward any hemodialysis procedure for him would be considered high-risk and that we could continue to try to dialyze him but w/ the condition of DNR status. Also gave him the option of hospice. Discussed all this w/ the wife present.  He declined hospice, agreed to DNR status and continued HD.  S/p stress dose steroids --> prednisone.  S/p ligation of fistula with Dr [name] 12/5, greatly appreciate assistance.  He is improved after ligation of fistula.  Needs to re-address access as an outpatient. 2. Volume- still vol up, HD with fluid removal 3. ESRD - HD MWF, off schedule, TTS while here, HD today 12/7 to get back on sched in prep for possible d/c 4. COPD/ pulm HTN- on home O2 5. Aflutter / fib -sinus tach on monitor hx failed ablation, have decided to forward with amiodarone.  Cardiology following. (? History of lung tox) 6. Possible cellulitis L arm; off antibiotics, resolved 7. Anemia of CKD - no esa needed, Hgb 11.7 8. MBD - cont meds 9. CAD hx CABG/ DES 10. Nutrition - add multivit alb 2.9 11. Dispo: from renal perspective will do HD today and then could possibly go from our perspective afterwards   Subjective:    Transferred out of ICU yesterday.  Doing quite well all things considered   Objective:   BP 101/72   Pulse (!) 108   Temp 98.2 F (36.8 C) (Oral)   Resp 19   Ht 5\' 10"  (1.778 m)   Wt 83.8 kg (184 lb 11.9 oz)   SpO2 95%   BMI 26.51 kg/m    Physical Exam: General: NAD, talkative, sitting up in bed.   Heart: tachycardic, regular Lungs: grossly clear Abdomen:soft NT Extremities: burnished skin lower extremities, no frank pretibial edema but dependent edema sacrum which is improving Dialysis Access: right IJ TDC in place; L RC AVF clotted, new LUE AVF ligated   Labs: BMET Recent Labs  Lab 08/08/17 0325 08/09/17 0245 08/10/17 0235 08/11/17 0221  NA 140 137 136 137  K 3.8 4.3 5.2* 4.7  CL 107 99* 96* 97*  CO2 23 18* 23 23  GLUCOSE 114* 97 127* 93  BUN 47* 63* 48* 64*  CREATININE 6.24* 7.52* 5.87* 6.79*  CALCIUM 5.9* 6.8* 7.0* 7.0*  PHOS  --  6.8* 5.3* 5.7*   CBC Recent Labs  Lab 08/09/17 0245 08/10/17 0235 08/11/17 0221 08/12/17 0503  WBC 9.9 9.8 9.3 7.9  HGB 12.9* 11.9* 12.0* 11.6*  HCT 39.0 36.7* 36.9* 36.2*  MCV 106.8* 106.7* 106.0* 104.9*  PLT 104* 108* 133* 104*    @IMGRELPRIORS @ Medications:    . amiodarone  400 mg Oral BID  . arformoterol  15 mcg Nebulization BID  . atorvastatin  20 mg Oral QHS  . Chlorhexidine Gluconate Cloth  6 each Topical Daily  . citalopram  20 mg Oral QHS  . midodrine  10 mg Oral TID  . multivitamin  1 tablet Oral  QHS  . pantoprazole  40 mg Oral BID  . umeclidinium bromide  1 puff Inhalation Daily  . Warfarin - Pharmacist Dosing Inpatient   Does not apply [place], MD [place] pgr [phone] 08/12/2017, 8:40 AM

## 2017-08-12 NOTE — Progress Notes (Signed)
Dialysis treatment completed.  3000 mL ultrafiltrated.  2500 mL net fluid removal.  Patient status unchanged. Lung sounds diminished to ausculation in all fields. Generalized edema. Cardiac: ST.  Cleansed RIJ catheter with chlorhexidine.  Disconnected lines and flushed ports with saline per protocol.  Ports locked with heparin and capped per protocol.    Report given to bedside, RN Tracie.

## 2017-08-12 NOTE — Progress Notes (Signed)
Progress Note  Patient Name: [name] Date of Encounter: 08/12/2017  Primary Cardiologist: Dr Rayann Heman  Subjective   Pt denies CP or dyspnea  Inpatient Medications    Scheduled Meds: . amiodarone  400 mg Oral BID  . arformoterol  15 mcg Nebulization BID  . atorvastatin  20 mg Oral QHS  . Chlorhexidine Gluconate Cloth  6 each Topical Daily  . citalopram  20 mg Oral QHS  . midodrine  10 mg Oral TID  . multivitamin  1 tablet Oral QHS  . pantoprazole  40 mg Oral BID  . umeclidinium bromide  1 puff Inhalation Daily  . Warfarin - Pharmacist Dosing Inpatient   Does not apply q1800   Continuous Infusions: . sodium chloride    . sodium chloride    . sodium chloride 10 mL/hr at 08/12/17 0200  . ferric gluconate (FERRLECIT/NULECIT) IV Stopped (08/03/17 1235)   PRN Meds: sodium chloride, sodium chloride, acetaminophen **OR** acetaminophen, albuterol, alteplase, diphenhydrAMINE, heparin, lidocaine (PF), lidocaine-prilocaine, pentafluoroprop-tetrafluoroeth, sodium chloride flush   Vital Signs    Vitals:   08/12/17 0333 08/12/17 0700 08/12/17 0825 08/12/17 0828  BP: 90/75 101/72    Pulse: (!) 109 (!) 108    Resp: 19 19    Temp: 98.2 F (36.8 C)     TempSrc: Oral     SpO2: 93% 93% 94% 95%  Weight:      Height:        Intake/Output Summary (Last 24 hours) at 08/12/2017 0844 Last data filed at 08/12/2017 0700 Gross per 24 hour  Intake 1017.25 ml  Output 2500 ml  Net -1482.75 ml   Filed Weights   08/11/17 1255 08/11/17 1630 08/12/17 0100  Weight: 193 lb 5.5 oz (87.7 kg) 188 lb 4.4 oz (85.4 kg) 184 lb 11.9 oz (83.8 kg)    Telemetry    Atrial fibrillation/flutter with RVR; rate continues to improve- Personally Reviewed   Physical Exam   GEN: WD, chronically ill appearing, NAD Neck: supple Cardiac: tachycardic Respiratory: CTA GI: Soft, diffuse abdominal wall edema unchanged MS: chronic skin changes, 2+ thigh edema Neuro:  Grossly intact   Labs      Chemistry Recent Labs  Lab 08/08/17 0325 08/09/17 0245 08/10/17 0235 08/11/17 0221  NA 140 137 136 137  K 3.8 4.3 5.2* 4.7  CL 107 99* 96* 97*  CO2 23 18* 23 23  GLUCOSE 114* 97 127* 93  BUN 47* 63* 48* 64*  CREATININE 6.24* 7.52* 5.87* 6.79*  CALCIUM 5.9* 6.8* 7.0* 7.0*  PROT 5.8*  --   --   --   ALBUMIN 2.3*  --   --   --   AST 40  --   --   --   ALT 9*  --   --   --   ALKPHOS 98  --   --   --   BILITOT 1.0  --   --   --   GFRNONAA 9* 7* 9* 8*  GFRAA 10* 8* 11* 9*  ANIONGAP 10 20* 17* 17*     Hematology Recent Labs  Lab 08/10/17 0235 08/11/17 0221 08/12/17 0503  WBC 9.8 9.3 7.9  RBC 3.44* 3.48* 3.45*  HGB 11.9* 12.0* 11.6*  HCT 36.7* 36.9* 36.2*  MCV 106.7* 106.0* 104.9*  MCH 34.6* 34.5* 33.6  MCHC 32.4 32.5 32.0  RDW 18.8* 18.6* 18.3*  PLT 108* 133* 104*       Patient Profile     61 year old gentleman with a  history of atrial flutter, CAD s/p CABG, s/p AVR, severe mitral regurgitation, chronic kidney disease. He has been having hypotension on dialysis recently. The symptoms seem to have worsened after he had his most recent AV fistula placed. Cardiology seeing for atrial arrhythmias. Echo shows normal LV function, s/pi AVR with mild AS/AI, severe MS/moderate MR, biatrial enlargement and severe TR.    Assessment & Plan    1 Atrial fibrillation/futter-HR improving with addition of amiodarone; will continue; change to 200 mg daily; BP will not allow other AV nodal blocking agents. Not a candidate for further attempts at ablation (discussed with Dr Rayann Heman previously). Would resume coumadin when ok with surgery following recent ligation of AV fistula. Follow INR closely as outpt given potential interaction with amiodarone.  2 Hypotension-BP improved; continue midodrine. Decreased BP previously felt secondary to pulmonary hypertension and RV failure.  3 ESRD-Dialysis per nephrology  4 COPD/Lung cancer  5 NCB; pt has declined hospice per notes.  Pt can  be DCed from a cardiac standpoint and fu with Dr Rayann Heman 6-8 weeks.  For questions or updates, please contact [place] Please consult [email or web address] for contact info under Cardiology/STEMI.      Signed, Kirk Ruths, MD  08/12/2017, 8:44 AM

## 2017-08-12 NOTE — Progress Notes (Signed)
Patient arrived to unit by bed.  Reviewed treatment plan and this RN agrees with plan.  Report received from bedside RN, Angelica.  Consent verified.  Patient A & O X 4.   Lung sounds diminished to ausculation in all fields. Generalized edema. Cardiac:  ST3.  Removed caps and cleansed RIJ catheter with chlorhedxidine.  Aspirated ports of heparin and flushed them with saline per protocol.  Connected and secured lines, initiated treatment at 1809.  UF Goal of 1500 mL and net fluid removal 1 L.  Will continue to monitor.

## 2017-08-12 NOTE — Progress Notes (Signed)
ANTICOAGULATION CONSULT NOTE  Pharmacy Consult:  Coumadin Indication: atrial fibrillation  Allergies  Allergen Reactions  . Adhesive [Tape] Rash    Please use paper tape  . Amoxicillin Rash    migraine  . Ativan [Lorazepam] Anxiety  . Bupropion Anxiety  . Diphenhydramine Itching and Anxiety    Only with IV doses. Tolerates oral.  . Penicillins Other (See Comments)    MIGRAINE  Has patient had a PCN reaction causing immediate rash, facial/tongue/throat swelling, SOB or lightheadedness with hypotension: no Has patient had a PCN reaction causing severe rash involving mucus membranes or skin necrosis: No Has patient had a PCN reaction that required hospitalization No Has patient had a PCN reaction occurring within the last 10 years: No If all of the above answers are "NO", then may proceed with Cephalosporin use.    Patient Measurements: Height: 5\' 10"  (177.8 cm) Weight: 184 lb 11.9 oz (83.8 kg) IBW/kg (Calculated) : 73  Assessment: 61 YOM on Coumadin 3.75mg  daily exc for 7.5mg  on Mon for hx of Afib. INR on admit 3.26, s/p Vit K 10mg  IV 12/5. S/p fistula ligation 12/5, ok to resume per Vascular 12/6. INR 1.56 today. Was started on amiodarone this admit as well. Hgb 11.6, plts 104.  Goal of Therapy:  INR 2-3 Monitor platelets by anticoagulation protocol: Yes   Plan:  Give Coumadin 5mg  PO x 1 tonight Monitor daily INR, CBC, s/s of bleed  Elenor Quinones, PharmD, [facility] Clinical Pharmacist Pager [phone] 08/12/2017 9:04 AM

## 2017-08-12 NOTE — Discharge Instructions (Signed)

## 2017-08-12 NOTE — Progress Notes (Signed)
Internal Medicine Attending:   I saw and examined the patient. I reviewed the resident's note and I agree with the resident's findings and plan as documented in the resident's note. Feeling well no complaints, wants to know if he can go home today.  Plan will be HD today, will need to reassess need for O2 after HD.  If no needs could possibly be discharged after.

## 2017-08-12 NOTE — Care Management Note (Signed)
Case Management Note  Patient Details  Name: [name] MRN: [id number] Date of Birth: 04-12-1956  Subjective/Objective:    From home with wife, pta indep,  ESRD,HD patient M-W-F.  He has PCP and medication coverage.  Presents with HTN, aflutter,  He has been having hypotension and dialysis recently. The symptoms seem to have worsened after he had his most recent AV fistula placed. He does not use any assistive devices at home.  12/7 [place], BSN- inflamed fistula, resp distress, afib, on amio, coumadin, monitoring heart rate, HD patient M W F, also for dialysis today.                  Action/Plan: NCM will follow for dc needs.  Expected Discharge Date:                  Expected Discharge Plan:  Home/Self Care  In-House Referral:     Discharge planning Services  CM Consult  Post Acute Care Choice:    Choice offered to:     DME Arranged:    DME Agency:     HH Arranged:    HH Agency:     Status of Service:  In process, will continue to follow  If discussed at Long Length of Stay Meetings, dates discussed:    Additional Comments:  Zenon Mayo, RN 08/12/2017, 4:43 PM

## 2017-08-12 NOTE — Progress Notes (Signed)
Greeted patient and some of his family who were visiting.  Talked a little and then had prayer for his healing and for staff caring for him.  Conard Novak, Chaplain   08/12/17 1600  Clinical Encounter Type  Visited With Patient and family together  Visit Type Initial;Spiritual support  Referral From Nurse  Consult/Referral To Chaplain  Spiritual Encounters  Spiritual Needs Prayer  Stress Factors  Patient Stress Factors None identified  Family Stress Factors None identified

## 2017-08-13 NOTE — Progress Notes (Signed)
Patient given coumadin 5 mg out of inventory pyxis per pharmacy.

## 2017-08-13 NOTE — Progress Notes (Signed)
Pt. A&OX4, stable vital signs. Discharged home with wife. Transported to front entrance via wheelchair by nurse tech.

## 2017-08-15 DIAGNOSIS — D509 Iron deficiency anemia, unspecified: Secondary | ICD-10-CM | POA: Diagnosis not present

## 2017-08-15 DIAGNOSIS — N2581 Secondary hyperparathyroidism of renal origin: Secondary | ICD-10-CM | POA: Diagnosis not present

## 2017-08-15 DIAGNOSIS — E875 Hyperkalemia: Secondary | ICD-10-CM | POA: Diagnosis not present

## 2017-08-15 DIAGNOSIS — N186 End stage renal disease: Secondary | ICD-10-CM | POA: Diagnosis not present

## 2017-08-17 DIAGNOSIS — D509 Iron deficiency anemia, unspecified: Secondary | ICD-10-CM | POA: Diagnosis not present

## 2017-08-17 DIAGNOSIS — E875 Hyperkalemia: Secondary | ICD-10-CM | POA: Diagnosis not present

## 2017-08-17 DIAGNOSIS — N2581 Secondary hyperparathyroidism of renal origin: Secondary | ICD-10-CM | POA: Diagnosis not present

## 2017-08-17 DIAGNOSIS — N186 End stage renal disease: Secondary | ICD-10-CM | POA: Diagnosis not present

## 2017-08-18 DIAGNOSIS — N2581 Secondary hyperparathyroidism of renal origin: Secondary | ICD-10-CM | POA: Diagnosis not present

## 2017-08-18 DIAGNOSIS — R000 Tachycardia, unspecified: Secondary | ICD-10-CM | POA: Diagnosis not present

## 2017-08-18 DIAGNOSIS — E8770 Fluid overload, unspecified: Secondary | ICD-10-CM | POA: Diagnosis not present

## 2017-08-18 DIAGNOSIS — N186 End stage renal disease: Secondary | ICD-10-CM | POA: Diagnosis not present

## 2017-08-19 ENCOUNTER — Ambulatory Visit (INDEPENDENT_AMBULATORY_CARE_PROVIDER_SITE_OTHER): Payer: Medicare Other | Admitting: Cardiology

## 2017-08-19 DIAGNOSIS — I4819 Other persistent atrial fibrillation: Secondary | ICD-10-CM

## 2017-08-19 DIAGNOSIS — Z952 Presence of prosthetic heart valve: Secondary | ICD-10-CM | POA: Diagnosis not present

## 2017-08-19 DIAGNOSIS — Z5181 Encounter for therapeutic drug level monitoring: Secondary | ICD-10-CM

## 2017-08-19 DIAGNOSIS — N186 End stage renal disease: Secondary | ICD-10-CM | POA: Diagnosis not present

## 2017-08-19 DIAGNOSIS — I480 Paroxysmal atrial fibrillation: Secondary | ICD-10-CM

## 2017-08-19 DIAGNOSIS — E875 Hyperkalemia: Secondary | ICD-10-CM | POA: Diagnosis not present

## 2017-08-19 DIAGNOSIS — Z7901 Long term (current) use of anticoagulants: Secondary | ICD-10-CM

## 2017-08-19 DIAGNOSIS — D509 Iron deficiency anemia, unspecified: Secondary | ICD-10-CM | POA: Diagnosis not present

## 2017-08-19 DIAGNOSIS — I4892 Unspecified atrial flutter: Secondary | ICD-10-CM | POA: Diagnosis not present

## 2017-08-19 DIAGNOSIS — I481 Persistent atrial fibrillation: Secondary | ICD-10-CM

## 2017-08-19 DIAGNOSIS — N2581 Secondary hyperparathyroidism of renal origin: Secondary | ICD-10-CM | POA: Diagnosis not present

## 2017-08-19 DIAGNOSIS — I483 Typical atrial flutter: Secondary | ICD-10-CM

## 2017-08-19 LAB — POCT INR: INR: 3.3

## 2017-08-19 NOTE — Patient Instructions (Signed)
Description   Do not take coumadin today Dec14th then continue taking 1/2 tablet (3.75mg ) daily except 1 tablet on Mondays.  Recheck in 1week.  Call us with any new medications # [phone] [facility], Main # [phone]

## 2017-08-22 DIAGNOSIS — E875 Hyperkalemia: Secondary | ICD-10-CM | POA: Diagnosis not present

## 2017-08-22 DIAGNOSIS — N186 End stage renal disease: Secondary | ICD-10-CM | POA: Diagnosis not present

## 2017-08-22 DIAGNOSIS — N2581 Secondary hyperparathyroidism of renal origin: Secondary | ICD-10-CM | POA: Diagnosis not present

## 2017-08-22 DIAGNOSIS — D509 Iron deficiency anemia, unspecified: Secondary | ICD-10-CM | POA: Diagnosis not present

## 2017-08-23 ENCOUNTER — Telehealth: Payer: Self-pay | Admitting: Cardiovascular Disease

## 2017-08-23 DIAGNOSIS — C44329 Squamous cell carcinoma of skin of other parts of face: Secondary | ICD-10-CM | POA: Diagnosis not present

## 2017-08-23 DIAGNOSIS — L570 Actinic keratosis: Secondary | ICD-10-CM | POA: Diagnosis not present

## 2017-08-23 DIAGNOSIS — L219 Seborrheic dermatitis, unspecified: Secondary | ICD-10-CM | POA: Diagnosis not present

## 2017-08-23 DIAGNOSIS — B354 Tinea corporis: Secondary | ICD-10-CM | POA: Diagnosis not present

## 2017-08-23 NOTE — Telephone Encounter (Signed)
I will forward to Pharmacist for review. 

## 2017-08-23 NOTE — Telephone Encounter (Signed)
Would not recommend fluconazole with amiodarone due to risk of QTc prolongation. Fluconazole will also interact with his Coumadin. Depending on location of yeast could use topical nystatin?

## 2017-08-23 NOTE — Telephone Encounter (Signed)
Joy( Wife ) is calling to find out if [name] can take Sluconazole 150 mg twice a week to get rid of the yeast pwr his Dermatologist . Wants to know if it will have an interaction with his Amiodarone 200 mg one a day . Please call

## 2017-08-23 NOTE — Telephone Encounter (Signed)
Discussed with pt's wife, Caryl Asp, she verbalized understanding, states dermatologist did give pt topical medication to use if recommendation was not to use fluconazole.

## 2017-08-24 DIAGNOSIS — N2581 Secondary hyperparathyroidism of renal origin: Secondary | ICD-10-CM | POA: Diagnosis not present

## 2017-08-24 DIAGNOSIS — D509 Iron deficiency anemia, unspecified: Secondary | ICD-10-CM | POA: Diagnosis not present

## 2017-08-24 DIAGNOSIS — E875 Hyperkalemia: Secondary | ICD-10-CM | POA: Diagnosis not present

## 2017-08-24 DIAGNOSIS — N186 End stage renal disease: Secondary | ICD-10-CM | POA: Diagnosis not present

## 2017-08-25 ENCOUNTER — Encounter: Payer: Medicare Other | Admitting: Vascular Surgery

## 2017-08-25 ENCOUNTER — Encounter (HOSPITAL_COMMUNITY): Payer: Medicare Other

## 2017-08-26 ENCOUNTER — Ambulatory Visit (INDEPENDENT_AMBULATORY_CARE_PROVIDER_SITE_OTHER): Payer: Medicare Other | Admitting: Internal Medicine

## 2017-08-26 DIAGNOSIS — I483 Typical atrial flutter: Secondary | ICD-10-CM | POA: Diagnosis not present

## 2017-08-26 DIAGNOSIS — N186 End stage renal disease: Secondary | ICD-10-CM | POA: Diagnosis not present

## 2017-08-26 DIAGNOSIS — Z7901 Long term (current) use of anticoagulants: Secondary | ICD-10-CM | POA: Diagnosis not present

## 2017-08-26 DIAGNOSIS — Z5181 Encounter for therapeutic drug level monitoring: Secondary | ICD-10-CM

## 2017-08-26 DIAGNOSIS — N2581 Secondary hyperparathyroidism of renal origin: Secondary | ICD-10-CM | POA: Diagnosis not present

## 2017-08-26 DIAGNOSIS — D509 Iron deficiency anemia, unspecified: Secondary | ICD-10-CM | POA: Diagnosis not present

## 2017-08-26 DIAGNOSIS — E875 Hyperkalemia: Secondary | ICD-10-CM | POA: Diagnosis not present

## 2017-08-26 LAB — POCT INR: INR: 3.6

## 2017-08-28 DIAGNOSIS — E875 Hyperkalemia: Secondary | ICD-10-CM | POA: Diagnosis not present

## 2017-08-28 DIAGNOSIS — N2581 Secondary hyperparathyroidism of renal origin: Secondary | ICD-10-CM | POA: Diagnosis not present

## 2017-08-28 DIAGNOSIS — N186 End stage renal disease: Secondary | ICD-10-CM | POA: Diagnosis not present

## 2017-08-28 DIAGNOSIS — D509 Iron deficiency anemia, unspecified: Secondary | ICD-10-CM | POA: Diagnosis not present

## 2017-08-31 ENCOUNTER — Telehealth: Payer: Self-pay | Admitting: Cardiovascular Disease

## 2017-08-31 DIAGNOSIS — N186 End stage renal disease: Secondary | ICD-10-CM | POA: Diagnosis not present

## 2017-08-31 DIAGNOSIS — N2581 Secondary hyperparathyroidism of renal origin: Secondary | ICD-10-CM | POA: Diagnosis not present

## 2017-08-31 DIAGNOSIS — I4891 Unspecified atrial fibrillation: Secondary | ICD-10-CM | POA: Diagnosis not present

## 2017-08-31 DIAGNOSIS — E875 Hyperkalemia: Secondary | ICD-10-CM | POA: Diagnosis not present

## 2017-08-31 DIAGNOSIS — D509 Iron deficiency anemia, unspecified: Secondary | ICD-10-CM | POA: Diagnosis not present

## 2017-08-31 NOTE — Telephone Encounter (Signed)
Made wife aware that it is okay to take mucinex.

## 2017-08-31 NOTE — Telephone Encounter (Signed)
Patient wife calling, would like to make sure that it would be okay for patient to start Mucinex (over the counte). [name] states that patient takes warfarin and amiodarone.

## 2017-09-01 ENCOUNTER — Encounter (HOSPITAL_COMMUNITY): Payer: Medicare Other

## 2017-09-01 ENCOUNTER — Encounter: Payer: Medicare Other | Admitting: Vascular Surgery

## 2017-09-01 ENCOUNTER — Emergency Department (HOSPITAL_COMMUNITY): Payer: Medicare Other

## 2017-09-01 ENCOUNTER — Encounter (HOSPITAL_COMMUNITY): Payer: Self-pay

## 2017-09-01 ENCOUNTER — Inpatient Hospital Stay (HOSPITAL_COMMUNITY)
Admission: EM | Admit: 2017-09-01 | Discharge: 2017-09-08 | DRG: 193 | Disposition: A | Discharge status: Home Health | Payer: Medicare Other | Attending: Oncology | Admitting: Oncology

## 2017-09-01 ENCOUNTER — Telehealth: Payer: Self-pay | Admitting: *Deleted

## 2017-09-01 ENCOUNTER — Other Ambulatory Visit: Payer: Self-pay

## 2017-09-01 DIAGNOSIS — R0602 Shortness of breath: Secondary | ICD-10-CM

## 2017-09-01 DIAGNOSIS — I252 Old myocardial infarction: Secondary | ICD-10-CM | POA: Diagnosis not present

## 2017-09-01 DIAGNOSIS — Z8249 Family history of ischemic heart disease and other diseases of the circulatory system: Secondary | ICD-10-CM

## 2017-09-01 DIAGNOSIS — R791 Abnormal coagulation profile: Secondary | ICD-10-CM | POA: Diagnosis not present

## 2017-09-01 DIAGNOSIS — Z9981 Dependence on supplemental oxygen: Secondary | ICD-10-CM | POA: Diagnosis not present

## 2017-09-01 DIAGNOSIS — N186 End stage renal disease: Secondary | ICD-10-CM | POA: Diagnosis not present

## 2017-09-01 DIAGNOSIS — Z880 Allergy status to penicillin: Secondary | ICD-10-CM | POA: Diagnosis not present

## 2017-09-01 DIAGNOSIS — D631 Anemia in chronic kidney disease: Secondary | ICD-10-CM | POA: Diagnosis not present

## 2017-09-01 DIAGNOSIS — Z825 Family history of asthma and other chronic lower respiratory diseases: Secondary | ICD-10-CM

## 2017-09-01 DIAGNOSIS — I959 Hypotension, unspecified: Secondary | ICD-10-CM

## 2017-09-01 DIAGNOSIS — I5032 Chronic diastolic (congestive) heart failure: Secondary | ICD-10-CM | POA: Diagnosis not present

## 2017-09-01 DIAGNOSIS — R609 Edema, unspecified: Secondary | ICD-10-CM

## 2017-09-01 DIAGNOSIS — I2510 Atherosclerotic heart disease of native coronary artery without angina pectoris: Secondary | ICD-10-CM | POA: Diagnosis present

## 2017-09-01 DIAGNOSIS — N2581 Secondary hyperparathyroidism of renal origin: Secondary | ICD-10-CM | POA: Diagnosis not present

## 2017-09-01 DIAGNOSIS — G35 Multiple sclerosis: Secondary | ICD-10-CM | POA: Diagnosis present

## 2017-09-01 DIAGNOSIS — Z7901 Long term (current) use of anticoagulants: Secondary | ICD-10-CM

## 2017-09-01 DIAGNOSIS — Z992 Dependence on renal dialysis: Secondary | ICD-10-CM

## 2017-09-01 DIAGNOSIS — I4892 Unspecified atrial flutter: Secondary | ICD-10-CM | POA: Diagnosis present

## 2017-09-01 DIAGNOSIS — I4891 Unspecified atrial fibrillation: Secondary | ICD-10-CM | POA: Diagnosis not present

## 2017-09-01 DIAGNOSIS — Z923 Personal history of irradiation: Secondary | ICD-10-CM

## 2017-09-01 DIAGNOSIS — J9621 Acute and chronic respiratory failure with hypoxia: Secondary | ICD-10-CM | POA: Diagnosis not present

## 2017-09-01 DIAGNOSIS — K5900 Constipation, unspecified: Secondary | ICD-10-CM | POA: Diagnosis not present

## 2017-09-01 DIAGNOSIS — Z951 Presence of aortocoronary bypass graft: Secondary | ICD-10-CM

## 2017-09-01 DIAGNOSIS — Z85118 Personal history of other malignant neoplasm of bronchus and lung: Secondary | ICD-10-CM | POA: Diagnosis not present

## 2017-09-01 DIAGNOSIS — B9781 Human metapneumovirus as the cause of diseases classified elsewhere: Secondary | ICD-10-CM | POA: Diagnosis not present

## 2017-09-01 DIAGNOSIS — Z87891 Personal history of nicotine dependence: Secondary | ICD-10-CM | POA: Diagnosis not present

## 2017-09-01 DIAGNOSIS — F419 Anxiety disorder, unspecified: Secondary | ICD-10-CM | POA: Diagnosis present

## 2017-09-01 DIAGNOSIS — J440 Chronic obstructive pulmonary disease with acute lower respiratory infection: Secondary | ICD-10-CM | POA: Diagnosis present

## 2017-09-01 DIAGNOSIS — R069 Unspecified abnormalities of breathing: Secondary | ICD-10-CM | POA: Diagnosis not present

## 2017-09-01 DIAGNOSIS — I480 Paroxysmal atrial fibrillation: Secondary | ICD-10-CM | POA: Diagnosis present

## 2017-09-01 DIAGNOSIS — Z9861 Coronary angioplasty status: Secondary | ICD-10-CM | POA: Diagnosis not present

## 2017-09-01 DIAGNOSIS — T8612 Kidney transplant failure: Secondary | ICD-10-CM | POA: Diagnosis not present

## 2017-09-01 DIAGNOSIS — R05 Cough: Secondary | ICD-10-CM | POA: Diagnosis not present

## 2017-09-01 DIAGNOSIS — I132 Hypertensive heart and chronic kidney disease with heart failure and with stage 5 chronic kidney disease, or end stage renal disease: Secondary | ICD-10-CM | POA: Diagnosis present

## 2017-09-01 DIAGNOSIS — I9589 Other hypotension: Secondary | ICD-10-CM | POA: Diagnosis present

## 2017-09-01 DIAGNOSIS — G4733 Obstructive sleep apnea (adult) (pediatric): Secondary | ICD-10-CM | POA: Diagnosis not present

## 2017-09-01 DIAGNOSIS — J9622 Acute and chronic respiratory failure with hypercapnia: Secondary | ICD-10-CM | POA: Diagnosis not present

## 2017-09-01 DIAGNOSIS — J441 Chronic obstructive pulmonary disease with (acute) exacerbation: Secondary | ICD-10-CM | POA: Diagnosis present

## 2017-09-01 DIAGNOSIS — Z888 Allergy status to other drugs, medicaments and biological substances status: Secondary | ICD-10-CM | POA: Diagnosis not present

## 2017-09-01 DIAGNOSIS — Z940 Kidney transplant status: Secondary | ICD-10-CM

## 2017-09-01 DIAGNOSIS — I739 Peripheral vascular disease, unspecified: Secondary | ICD-10-CM | POA: Diagnosis present

## 2017-09-01 DIAGNOSIS — I272 Pulmonary hypertension, unspecified: Secondary | ICD-10-CM | POA: Diagnosis present

## 2017-09-01 DIAGNOSIS — Z881 Allergy status to other antibiotic agents status: Secondary | ICD-10-CM | POA: Diagnosis not present

## 2017-09-01 DIAGNOSIS — E8770 Fluid overload, unspecified: Secondary | ICD-10-CM | POA: Diagnosis present

## 2017-09-01 DIAGNOSIS — Z953 Presence of xenogenic heart valve: Secondary | ICD-10-CM

## 2017-09-01 DIAGNOSIS — R7989 Other specified abnormal findings of blood chemistry: Secondary | ICD-10-CM | POA: Diagnosis not present

## 2017-09-01 DIAGNOSIS — I872 Venous insufficiency (chronic) (peripheral): Secondary | ICD-10-CM | POA: Diagnosis not present

## 2017-09-01 DIAGNOSIS — K219 Gastro-esophageal reflux disease without esophagitis: Secondary | ICD-10-CM | POA: Diagnosis present

## 2017-09-01 DIAGNOSIS — J123 Human metapneumovirus pneumonia: Secondary | ICD-10-CM | POA: Diagnosis not present

## 2017-09-01 DIAGNOSIS — Z91048 Other nonmedicinal substance allergy status: Secondary | ICD-10-CM | POA: Diagnosis not present

## 2017-09-01 DIAGNOSIS — I5030 Unspecified diastolic (congestive) heart failure: Secondary | ICD-10-CM | POA: Diagnosis not present

## 2017-09-01 DIAGNOSIS — Z66 Do not resuscitate: Secondary | ICD-10-CM | POA: Diagnosis present

## 2017-09-01 DIAGNOSIS — D696 Thrombocytopenia, unspecified: Secondary | ICD-10-CM | POA: Diagnosis not present

## 2017-09-01 LAB — I-STAT ARTERIAL BLOOD GAS, ED
ACID-BASE DEFICIT: 4 mmol/L — AB (ref 0.0–2.0)
Acid-base deficit: 3 mmol/L — ABNORMAL HIGH (ref 0.0–2.0)
Bicarbonate: 27.2 mmol/L (ref 20.0–28.0)
Bicarbonate: 24.6 mmol/L (ref 20.0–28.0)
O2 Saturation: 88 %
O2 Saturation: 91 %
PCO2 ART: 71.1 mmHg — AB (ref 32.0–48.0)
PH ART: 7.191 — AB (ref 7.350–7.450)
PH ART: 7.212 — AB (ref 7.350–7.450)
PO2 ART: 75 mmHg — AB (ref 83.0–108.0)
Patient temperature: 98.6
TCO2: 29 mmol/L (ref 22–32)
TCO2: 26 mmol/L (ref 22–32)
pCO2 arterial: 61 mmHg — ABNORMAL HIGH (ref 32.0–48.0)
pO2, Arterial: 69 mmHg — ABNORMAL LOW (ref 83.0–108.0)

## 2017-09-01 LAB — CBC WITH DIFFERENTIAL/PLATELET
BASOS ABS: 0 10*3/uL (ref 0.0–0.1)
BASOS PCT: 0 %
EOS ABS: 0 10*3/uL (ref 0.0–0.7)
Eosinophils Relative: 0 %
HCT: 37.1 % — ABNORMAL LOW (ref 39.0–52.0)
Hemoglobin: 11.6 g/dL — ABNORMAL LOW (ref 13.0–17.0)
LYMPHS ABS: 1.1 10*3/uL (ref 0.7–4.0)
Lymphocytes Relative: 15 %
MCH: 34.5 pg — AB (ref 26.0–34.0)
MCHC: 31.3 g/dL (ref 30.0–36.0)
MCV: 110.4 fL — ABNORMAL HIGH (ref 78.0–100.0)
Monocytes Absolute: 0.2 10*3/uL (ref 0.1–1.0)
Monocytes Relative: 3 %
NEUTROS ABS: 6 10*3/uL (ref 1.7–7.7)
Neutrophils Relative %: 82 %
Platelets: 90 10*3/uL — ABNORMAL LOW (ref 150–400)
RBC: 3.36 MIL/uL — ABNORMAL LOW (ref 4.22–5.81)
RDW: 18.6 % — AB (ref 11.5–15.5)
WBC: 7.3 10*3/uL (ref 4.0–10.5)

## 2017-09-01 LAB — COMPREHENSIVE METABOLIC PANEL
ALK PHOS: 131 U/L — AB (ref 38–126)
ALT: 18 U/L (ref 17–63)
AST: 26 U/L (ref 15–41)
Albumin: 3.1 g/dL — ABNORMAL LOW (ref 3.5–5.0)
Anion gap: 12 (ref 5–15)
BILIRUBIN TOTAL: 1.3 mg/dL — AB (ref 0.3–1.2)
BUN: 30 mg/dL — AB (ref 6–20)
CALCIUM: 8.5 mg/dL — AB (ref 8.9–10.3)
CO2: 25 mmol/L (ref 22–32)
CREATININE: 5.06 mg/dL — AB (ref 0.61–1.24)
Chloride: 99 mmol/L — ABNORMAL LOW (ref 101–111)
GFR calc Af Amer: 13 mL/min — ABNORMAL LOW (ref >60)
GFR, EST NON AFRICAN AMERICAN: 11 mL/min — AB (ref >60)
Glucose, Bld: 120 mg/dL — ABNORMAL HIGH (ref 65–99)
Potassium: 4.2 mmol/L (ref 3.5–5.1)
Sodium: 136 mmol/L (ref 135–145)
TOTAL PROTEIN: 7.8 g/dL (ref 6.5–8.1)

## 2017-09-01 LAB — RESPIRATORY PANEL BY PCR
ADENOVIRUS-RVPPCR: NOT DETECTED
Bordetella pertussis: NOT DETECTED
CHLAMYDOPHILA PNEUMONIAE-RVPPCR: NOT DETECTED
CORONAVIRUS HKU1-RVPPCR: NOT DETECTED
CORONAVIRUS NL63-RVPPCR: NOT DETECTED
Coronavirus 229E: NOT DETECTED
Coronavirus OC43: NOT DETECTED
Influenza A: NOT DETECTED
Influenza B: NOT DETECTED
MYCOPLASMA PNEUMONIAE-RVPPCR: NOT DETECTED
Metapneumovirus: DETECTED — AB
Parainfluenza Virus 1: NOT DETECTED
Parainfluenza Virus 2: NOT DETECTED
Parainfluenza Virus 3: NOT DETECTED
Parainfluenza Virus 4: NOT DETECTED
Respiratory Syncytial Virus: NOT DETECTED
Rhinovirus / Enterovirus: NOT DETECTED

## 2017-09-01 LAB — MRSA PCR SCREENING: MRSA BY PCR: NEGATIVE

## 2017-09-01 LAB — TROPONIN I: Troponin I: 0.07 ng/mL (ref <0.03)

## 2017-09-01 LAB — INFLUENZA PANEL BY PCR (TYPE A & B)
INFLAPCR: NEGATIVE
INFLBPCR: NEGATIVE

## 2017-09-01 LAB — PROTIME-INR
INR: 2.52
PROTHROMBIN TIME: 27 s — AB (ref 11.4–15.2)

## 2017-09-01 LAB — I-STAT CG4 LACTIC ACID, ED
LACTIC ACID, VENOUS: 1.68 mmol/L (ref 0.5–1.9)
LACTIC ACID, VENOUS: 1.37 mmol/L (ref 0.5–1.9)

## 2017-09-01 MED ORDER — SODIUM CHLORIDE 0.9 % IV SOLN
1750.0000 mg | Freq: Once | INTRAVENOUS | Status: DC
  Filled 2017-09-01: qty 1750

## 2017-09-01 MED ORDER — SENNOSIDES-DOCUSATE SODIUM 8.6-50 MG PO TABS: 1.0000 | ORAL_TABLET | Freq: Every evening | ORAL | Status: DC | PRN

## 2017-09-01 MED ORDER — AMIODARONE HCL IN DEXTROSE 360-4.14 MG/200ML-% IV SOLN: 30.0000 mg/h | INTRAVENOUS | Status: DC

## 2017-09-01 MED ORDER — IPRATROPIUM-ALBUTEROL 0.5-2.5 (3) MG/3ML IN SOLN
3.0000 mL | RESPIRATORY_TRACT | Status: DC
  Administered 2017-09-01 – 2017-09-02 (×5): 3 mL via RESPIRATORY_TRACT
  Filled 2017-09-01 (×5): qty 3

## 2017-09-01 MED ORDER — ACETAMINOPHEN 325 MG PO TABS: 650.0000 mg | ORAL_TABLET | Freq: Four times a day (QID) | ORAL | Status: DC | PRN

## 2017-09-01 MED ORDER — HEPARIN SODIUM (PORCINE) 5000 UNIT/ML IJ SOLN: 5000.0000 [IU] | Freq: Three times a day (TID) | INTRAMUSCULAR | Status: DC

## 2017-09-01 MED ORDER — VANCOMYCIN HCL IN DEXTROSE 1-5 GM/200ML-% IV SOLN
1000.0000 mg | Freq: Once | INTRAVENOUS | Status: DC
  Filled 2017-09-01: qty 200

## 2017-09-01 MED ORDER — WARFARIN SODIUM 2.5 MG PO TABS
3.7500 mg | ORAL_TABLET | Freq: Once | ORAL | Status: AC
  Administered 2017-09-01: 3.75 mg via ORAL
  Filled 2017-09-01: qty 1

## 2017-09-01 MED ORDER — SODIUM CHLORIDE 0.9 % IV BOLUS (SEPSIS)
1000.0000 mL | Freq: Once | INTRAVENOUS | Status: AC
  Administered 2017-09-01: 1000 mL via INTRAVENOUS

## 2017-09-01 MED ORDER — METHYLPREDNISOLONE SODIUM SUCC 125 MG IJ SOLR
125.0000 mg | Freq: Once | INTRAMUSCULAR | Status: AC
  Administered 2017-09-01: 125 mg via INTRAVENOUS
  Filled 2017-09-01: qty 2

## 2017-09-01 MED ORDER — SODIUM CHLORIDE 0.9 % IV BOLUS (SEPSIS)
250.0000 mL | Freq: Once | INTRAVENOUS | Status: AC
  Administered 2017-09-01: 250 mL via INTRAVENOUS

## 2017-09-01 MED ORDER — LEVOFLOXACIN IN D5W 500 MG/100ML IV SOLN: 500.0000 mg | INTRAVENOUS | Status: DC

## 2017-09-01 MED ORDER — SODIUM CHLORIDE 0.9 % IV BOLUS (SEPSIS)
250.0000 mL | Freq: Once | INTRAVENOUS | Status: AC
Start: 2017-09-01 — End: 2017-09-01
  Administered 2017-09-01: 250 mL via INTRAVENOUS

## 2017-09-01 MED ORDER — WARFARIN SODIUM 2.5 MG PO TABS: 3.7500 mg | ORAL_TABLET | Freq: Once | ORAL | Status: DC

## 2017-09-01 MED ORDER — PREDNISONE 50 MG PO TABS: 50.0000 mg | ORAL_TABLET | Freq: Every day | ORAL | Status: DC

## 2017-09-01 MED ORDER — DEXTROSE 5 % IV SOLN: 500.0000 mg | Freq: Two times a day (BID) | INTRAVENOUS | Status: DC

## 2017-09-01 MED ORDER — LEVOFLOXACIN IN D5W 750 MG/150ML IV SOLN
750.0000 mg | Freq: Once | INTRAVENOUS | Status: AC
  Administered 2017-09-01: 750 mg via INTRAVENOUS
  Filled 2017-09-01: qty 150

## 2017-09-01 MED ORDER — METHYLPREDNISOLONE SODIUM SUCC 125 MG IJ SOLR: 125.0000 mg | Freq: Every day | INTRAMUSCULAR | Status: DC

## 2017-09-01 MED ORDER — ATORVASTATIN CALCIUM 20 MG PO TABS
20.0000 mg | ORAL_TABLET | Freq: Every day | ORAL | Status: DC
  Administered 2017-09-01 – 2017-09-07 (×7): 20 mg via ORAL
  Filled 2017-09-01 (×7): qty 1

## 2017-09-01 MED ORDER — ALBUTEROL SULFATE (2.5 MG/3ML) 0.083% IN NEBU
5.0000 mg | INHALATION_SOLUTION | Freq: Once | RESPIRATORY_TRACT | Status: AC
  Administered 2017-09-01: 5 mg via RESPIRATORY_TRACT
  Filled 2017-09-01: qty 6

## 2017-09-01 MED ORDER — METHYLPREDNISOLONE SODIUM SUCC 125 MG IJ SOLR
60.0000 mg | Freq: Four times a day (QID) | INTRAMUSCULAR | Status: DC
  Administered 2017-09-01 – 2017-09-02 (×2): 60 mg via INTRAVENOUS
  Filled 2017-09-01 (×3): qty 2

## 2017-09-01 MED ORDER — MIDODRINE HCL 5 MG PO TABS
10.0000 mg | ORAL_TABLET | Freq: Once | ORAL | Status: DC
  Filled 2017-09-01: qty 2

## 2017-09-01 MED ORDER — AMIODARONE HCL IN DEXTROSE 360-4.14 MG/200ML-% IV SOLN: 60.0000 mg/h | INTRAVENOUS | Status: DC

## 2017-09-01 MED ORDER — CALCIUM ACETATE (PHOS BINDER) 667 MG PO CAPS
4002.0000 mg | ORAL_CAPSULE | Freq: Three times a day (TID) | ORAL | Status: DC
  Administered 2017-09-02 – 2017-09-03 (×4): 4002 mg via ORAL
  Administered 2017-09-06: 2001 mg via ORAL
  Administered 2017-09-07: 4002 mg via ORAL
  Administered 2017-09-08: 2001 mg via ORAL
  Filled 2017-09-01 (×9): qty 6

## 2017-09-01 MED ORDER — RENA-VITE PO TABS
1.0000 | ORAL_TABLET | Freq: Every day | ORAL | Status: DC
  Administered 2017-09-01 – 2017-09-07 (×7): 1 via ORAL
  Filled 2017-09-01 (×7): qty 1

## 2017-09-01 MED ORDER — MIDODRINE HCL 5 MG PO TABS
10.0000 mg | ORAL_TABLET | Freq: Three times a day (TID) | ORAL | Status: DC
  Administered 2017-09-01 – 2017-09-08 (×21): 10 mg via ORAL
  Filled 2017-09-01 (×21): qty 2

## 2017-09-01 MED ORDER — AMIODARONE HCL 200 MG PO TABS
200.0000 mg | ORAL_TABLET | Freq: Every day | ORAL | Status: DC
  Administered 2017-09-01 – 2017-09-08 (×7): 200 mg via ORAL
  Filled 2017-09-01 (×8): qty 1

## 2017-09-01 MED ORDER — ALBUTEROL (5 MG/ML) CONTINUOUS INHALATION SOLN
10.0000 mg/h | INHALATION_SOLUTION | RESPIRATORY_TRACT | Status: DC
  Administered 2017-09-01: 10 mg/h via RESPIRATORY_TRACT

## 2017-09-01 MED ORDER — AZTREONAM 2 G IJ SOLR: 2.0000 g | Freq: Once | INTRAMUSCULAR | Status: DC

## 2017-09-01 MED ORDER — AZTREONAM 1 G IJ SOLR
1.0000 g | Freq: Once | INTRAMUSCULAR | Status: DC
  Filled 2017-09-01: qty 1

## 2017-09-01 MED ORDER — VANCOMYCIN HCL IN DEXTROSE 1-5 GM/200ML-% IV SOLN: 1000.0000 mg | INTRAVENOUS | Status: DC

## 2017-09-01 MED ORDER — WARFARIN - PHARMACIST DOSING INPATIENT
Freq: Every day | Status: DC
  Administered 2017-09-02 – 2017-09-08 (×3)

## 2017-09-01 MED ORDER — AMIODARONE LOAD VIA INFUSION
150.0000 mg | Freq: Once | INTRAVENOUS | Status: DC
  Filled 2017-09-01: qty 83.34

## 2017-09-01 NOTE — Progress Notes (Signed)
ANTICOAGULATION CONSULT NOTE - Initial Consult  Pharmacy Consult for Coumadin Indication: atrial fibrillation  Allergies  Allergen Reactions  . Adhesive [Tape] Rash    Please use paper tape  . Amoxicillin Rash    migraine  . Ativan [Lorazepam] Anxiety  . Bupropion Anxiety  . Diphenhydramine Itching and Anxiety    Only with IV doses. Tolerates oral.  . Penicillins Other (See Comments)    MIGRAINE  Has patient had a PCN reaction causing immediate rash, facial/tongue/throat swelling, SOB or lightheadedness with hypotension: no Has patient had a PCN reaction causing severe rash involving mucus membranes or skin necrosis: No Has patient had a PCN reaction that required hospitalization No Has patient had a PCN reaction occurring within the last 10 years: No If all of the above answers are "NO", then may proceed with Cephalosporin use.    Patient Measurements: Height: 5\' 10"  (177.8 cm) Weight: 179 lb (81.2 kg) IBW/kg (Calculated) : 73 Heparin Dosing Weight: n/a  Vital Signs: Temp: 98.6 F (37 C) (12/27 1918) Temp Source: Oral (12/27 1918) BP: 82/63 (12/27 1943) Pulse Rate: 114 (12/27 1924)  Labs: Recent Labs    09/01/17 1224 09/01/17 1230  HGB 11.6*  --   HCT 37.1*  --   PLT 90*  --   LABPROT 27.0*  --   INR 2.52  --   CREATININE 5.06*  --   TROPONINI  --  0.07*    Estimated Creatinine Clearance: 15.6 mL/min (A) (by C-G formula based on SCr of 5.06 mg/dL (H)).   Medical History: Past Medical History:  Diagnosis Date  . Acute blood loss anemia    a. 02/2016 due to groin hematoma. Rec 3 U PRBC.  Marland Kitchen Anemia   . Anxiety   . Aortic heart valve prolapse 04/2013   a. s/p bioprosthetic AVR at time of CABG.  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 Bifascicular block   . CAD (coronary artery disease) 04/2013   a. 04/2013: s/p CABG x 2 (Y SVG -LAD & D2). b. 01/2015: NSTEMI s/p DES to LCx, BMS to SVG-LAD. c. NSTEMI 05/2015 during AF/AFL - non-flow limiting  FFR. d. Low risk nuc 3/17. e. STEMI 6/17 after coming off Plavix, s/p DES to SVG-LAD into native vessel.  . Cancer ([place])    lung cancer - 5 doses of radiation, F/U in Dec. 2018  . Carotid artery disease ([facility])    a. 1-39% stenosis bilaterally in 06/2015.   Marland Kitchen CHF (congestive heart failure) ([place])   . Chronic respiratory failure ([place])   . COPD (chronic obstructive pulmonary disease) ([place])   . Dyspnea   . 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 b. He had a renal Tx from 2008 to 2011, but subsequent rejection - Gets HD in [place], [place] on MWF schedule.    Marland Kitchen GERD (gastroesophageal reflux disease)   . Hematoma    a. Large right groin hematoma after cath 02/2016 with associated ABL anemia.  . Hypertension   . Multiple sclerosis ([place])   . Myocardial infarction ([place])   . Nocturnal hypoxemia   . On home oxygen therapy    pt states he has not been wearing it  . PAF (paroxysmal atrial fibrillation) ([place])    a. h/o, placed on amiodarone 07/2016 due to recurrence.  . Paroxysmal atrial flutter ([place])    a. During 05/2015 admission - SVT, atrial flutter, and PAF.  Marland Kitchen Peripheral vascular disease ([place])  Cath in 01/2015 required 45 cm Destination Sheath  . Pneumonia   . Renal transplant failure and rejection   . 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 intolerant of bipap. Sleeps with O2 at 2 L  . SVT (supraventricular tachycardia) ([place])   . Valvular heart disease    a. 2D Echo 03/25/16: mild LVH, EF 50-55%, grade 2 Dd, AVR present with mild AI, mod MR, severely dilated LA, mildly dilated RV, mod RAE, PASP 72.    Medications:  Scheduled:  . amiodarone  200 mg Oral Daily  . atorvastatin  20 mg Oral QHS  . [START ON 09/02/2017] calcium acetate  4,002 mg Oral TID WC  . ipratropium-albuterol  3 mL Nebulization Q4H  . midodrine  10 mg Oral TID  . multivitamin  1 tablet Oral Daily  . [START ON 09/02/2017] predniSONE  50 mg  Oral Q breakfast    Assessment: 61 yo male with hx afib, on chronic Coumadin for afib.  PTA Coumadin dose 3.75 mg daily.  Last dose taken 12/26.  Dose confirmed with Coumadin clinic records.  Today's INR at goal.  No overt bleeding or complications noted.  Goal of Therapy:  INR 2-3 Monitor platelets by anticoagulation protocol: Yes   Plan:  1. Coumadin 3.75 mg x 1 tonight. 2. Daily INR.  Uvaldo Rising, BCPS  Clinical Pharmacist Pager [phone]  09/01/2017 7:50 PM

## 2017-09-01 NOTE — ED Notes (Signed)
EDP made aware of critical troponin of 0.07.

## 2017-09-01 NOTE — ED Notes (Signed)
Rate and rhythm change noted and new EKG done for Dr. Lita Mains. BP of 70 noterd to Dr. Lita Mains and admitting MD.

## 2017-09-01 NOTE — ED Notes (Signed)
Dr. Lita Mains now states to given levaquin as ordered.

## 2017-09-01 NOTE — Progress Notes (Signed)
Code Sepsis Spoke with RN re antibiotics States the MD asked to hold them, there was some question of falsely elevated lactic acid. Now lactic acid WNL. MD stopped aztreonam/vancomycin, will proceed with Levaquin. Levaquin 750 mg IV x1 then 500 mg IV q48h  Harvel Quale  09/01/2017 2:22 PM

## 2017-09-01 NOTE — Telephone Encounter (Signed)
Holly RN with [facility] called to inform us that the pt did not look well therefore she sent him to the Hospital to be seen, therefore, the pt will not have an INR done by her today because he needs a Physician.

## 2017-09-01 NOTE — ED Notes (Signed)
Admitting team at bedsdie

## 2017-09-01 NOTE — ED Notes (Signed)
Condom catheter removed.

## 2017-09-01 NOTE — ED Provider Notes (Signed)
[name] Note   CSN: [id number] Arrival date & time: 09/01/17  1152     History   Chief Complaint Chief Complaint  Patient presents with  . Shortness of Breath    HPI [name] is a 61 y.o. male.  HPI Patient with history of end-stage renal disease last dialyzed yesterday presents with 2 days of increasing shortness of breath and cough.  Cough is nonproductive.  Denies any fever or chills.  States he normally wears 2 L of home O2 and is increased this to 4.  He continues to have bilateral lower extremity swelling.  Denies any pain including chest or abdominal pain. Past Medical History:  Diagnosis Date  . Acute blood loss anemia    a. 02/2016 due to groin hematoma. Rec 3 U PRBC.  Marland Kitchen Anemia   . Anxiety   . Aortic heart valve prolapse 04/2013   a. s/p bioprosthetic AVR at time of CABG.  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 Bifascicular block   . CAD (coronary artery disease) 04/2013   a. 04/2013: s/p CABG x 2 (Y SVG -LAD & D2). b. 01/2015: NSTEMI s/p DES to LCx, BMS to SVG-LAD. c. NSTEMI 05/2015 during AF/AFL - non-flow limiting FFR. d. Low risk nuc 3/17. e. STEMI 6/17 after coming off Plavix, s/p DES to SVG-LAD into native vessel.  . Cancer ([place])    lung cancer - 5 doses of radiation, F/U in Dec. 2018  . Carotid artery disease (HCC)    a. 1-39% stenosis bilaterally in 06/2015.   Marland Kitchen CHF (congestive heart failure) ([place])   . Chronic respiratory failure ([place])   . COPD (chronic obstructive pulmonary disease) ([place])   . Dyspnea   . 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 b. He had a renal Tx from 2008 to 2011, but subsequent rejection - Gets HD in [place], [place] on MWF schedule.    Marland Kitchen GERD (gastroesophageal reflux disease)   . Hematoma    a. Large right groin hematoma after cath 02/2016 with associated ABL anemia.  . Hypertension   . Multiple sclerosis ([place])   .  Myocardial infarction ([place])   . Nocturnal hypoxemia   . On home oxygen therapy    pt states he has not been wearing it  . PAF (paroxysmal atrial fibrillation) ([place])    a. h/o, placed on amiodarone 07/2016 due to recurrence.  . Paroxysmal atrial flutter ([place])    a. During 05/2015 admission - SVT, atrial flutter, and PAF.  Marland Kitchen Peripheral vascular disease ([place])    Cath in 01/2015 required 45 cm Dest[place]  . Pneumonia   . Renal transplant failure and rejection   . 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 intolerant of bipap. Sleeps with O2 at 2 L  . SVT (supraventricular tachycardia) ([place])   . Valvular heart disease    a. 2D Echo 03/25/16: mild LVH, EF 50-55%, grade 2 Dd, AVR present with mild AI, mod MR, severely dilated LA, mildly dilated RV, mod RAE, PASP 72.    Patient Active Problem List   Diagnosis Date Noted  . Pressure injury of skin 08/10/2017  . Persistent atrial fibrillation ([place])   . Palliative care by specialist   . Orthostatic hypotension 08/01/2017  . Acute pulmonary edema ([place]) 12/06/2016  . CAP (  community acquired pneumonia) 11/15/2016  . Acute respiratory failure with hypoxia ([place]) 11/15/2016  . COPD exacerbation ([place]) 11/15/2016  . Long term current use of amiodarone 09/28/2016  . Mixed hyperlipidemia 07/12/2016  . Restless leg syndrome 07/12/2016  . CHF (congestive heart failure) ([place]) 07/09/2016  . Coronary artery disease 07/09/2016  . Acute CHF ([place]) 07/09/2016  . Gastrointestinal hemorrhage 06/16/2016  . COPD GOLD III  03/24/2016  . Elevated troponin 03/24/2016  . Chronic diastolic congestive heart failure ([place]) 03/24/2016  . SOB (shortness of breath) 03/24/2016  . Left leg swelling   . Macrocytic anemia   . Acute blood loss anemia 02/19/2016  . Groin hematoma 02/19/2016  . NSVT (nonsustained ventricular tachycardia) ([place]) 02/19/2016  . Bifascicular block   . Hypoxia   . Chronic anticoagulation-Coumadin  02/15/2016  . H/O aortic valve replacement 02/15/2016  . Hx of CABG x 2 2014 02/15/2016  . Acute ST elevation myocardial infarction (STEMI) ([place]) 02/15/2016  . Hemorrhoids 10/09/2015  . Recurrent major depressive disorder, in full remission ([place]) 10/09/2015  . Encounter for therapeutic drug monitoring 06/04/2015  . Paroxysmal atrial flutter ([place]) 06/04/2015  . Supraventricular tachycardia ([place]) 06/04/2015  . Anemia   . Recurrent coronary arteriosclerosis after percutaneous transluminal coronary angioplasty   . Typical atrial flutter ([place]) 05/21/2015  . Paroxysmal atrial fibrillation ([place]) 02/21/2015  . CAD of artery bypass graft-occluded SVG-LAD 02/15/16   . Hypertension   . Acute on chronic kidney failure ([place]) 05/01/2013  . Metabolic acidosis 95/05/3266  . Chronic constipation 04/20/2013  . 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 Constipation, chronic 04/20/2013  . 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 Periodontal disease 04/20/2013  . Abscess of aortic root 04/19/2013  . 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 Non-ST elevation MI (NSTEMI) ([place]) 01/09/2013  . Obstructive sleep apnea syndrome 01/09/2013  . Aortic valve disease 12/15/2012  . GPC Bacteremia 11/14/2012  . Neck pain 05/18/2012  . Kidney transplant failure 02/14/2012  . Multiple sclerosis ([place]) 02/14/2012  . Thrombocytopenia ([place]) 02/12/2012  . Hypotension 02/12/2012  . Syncope and collapse 02/12/2012  . Aortic valve insufficiency 02/12/2012  . End-stage renal disease ([place]) 02/12/2012  . Left-sided heart failure ([place]) 01/19/2011    Past Surgical History:  Procedure Laterality Date  . APPENDECTOMY    . AV FISTULA PLACEMENT    . AV FISTULA PLACEMENT Left 07/19/2017   Procedure: LEFT BRACHIOCEPHALIC ARTERIOVENOUS FISTULA CREATION;  Surgeon: Elam Dutch, MD;  Location: [facility];  Service: Vascular;  Laterality: Left;  . CARDIAC CATHETERIZATION N/A 05/26/2015   Procedure: Left Heart Cath and  Coronary Angiography;  Surgeon: Leonie Man, MD;  Location: [facility] CV LAB;  Service: Cardiovascular;  Laterality: N/A;  . CARDIAC CATHETERIZATION N/A 05/26/2015   Procedure: Intravascular Pressure Wire/FFR Study;  Surgeon: Leonie Man, MD;  Location: [facility] LAB;  Service: Cardiovascular;  Laterality: N/A;  . CARDIAC CATHETERIZATION N/A 02/15/2016   Procedure: Left Heart Cath and Coronary Angiography;  Surgeon: Wellington Hampshire, MD;  Location: [facility];  Service: Cardiovascular;  Laterality: N/A;  . CARDIOVERSION N/A 07/12/2016   Procedure: CARDIOVERSION;  Surgeon: Minus Breeding, MD;  Location[facility] ENDOSCOPY;  Service: Cardiovascular;  Laterality: N/A;  . CARDIOVERSION N/A 11/11/2016   Procedure: CARDIOVERSION;  Surgeon: Fay Records, MD;  Location: [facility];  Service: Cardiovascular;  Laterality: N/A;  . CARDIOVERSION N/A 12/09/2016   Procedure: CARDIOVERSION;  Surgeon: Sanda Klein, MD;  Location: [facility] ENDOSCOPY;  Service: Cardiovascular;  Laterality: N/A;  . COLONOSCOPY    .  CORONARY ANGIOPLASTY    . CORONARY ARTERY BYPASS GRAFT    . ESOPHAGOGASTRODUODENOSCOPY ENDOSCOPY    . excised squamous cells at rectum  2006  . flash     flash pulmonary edema  . HERNIA REPAIR  29/5284   umbilical hernia  . KIDNEY TRANSPLANT  08/2007  . KNEE SURGERY    . LEFT HEART CATHETERIZATION WITH CORONARY ANGIOGRAM N/A 01/09/2013   Procedure: LEFT HEART CATHETERIZATION WITH CORONARY ANGIOGRAM;  Surgeon: Burnell Blanks, MD;  Location: [facility] CATH LAB;  Service: Cardiovascular;  Laterality: N/A;  . LIGATION OF ARTERIOVENOUS  FISTULA Left 08/10/2017   Procedure: LIGATION OF ARTERIOVENOUS  FISTULA;  Surgeon: Elam Dutch, MD;  Location: [facility];  Service: Vascular;  Laterality: Left;  . PARATHYROIDECTOMY    . TEE WITHOUT CARDIOVERSION N/A 11/11/2016   Procedure: TRANSESOPHAGEAL ECHOCARDIOGRAM (TEE);  Surgeon: Fay Records, MD;  Location: [facility];  Service: Cardiovascular;   Laterality: N/A;  . TEE WITHOUT CARDIOVERSION N/A 12/09/2016   Procedure: TRANSESOPHAGEAL ECHOCARDIOGRAM (TEE);  Surgeon: Sanda Klein, MD;  Location: [facility] ENDOSCOPY;  Service: Cardiovascular;  Laterality: N/A;       Home Medications    Prior to Admission medications   Medication Sig Start Date End Date Taking? Authorizing Weiss  acetaminophen (TYLENOL) 500 MG tablet Take 1,000 mg every 6 (six) hours as needed by mouth for mild pain.    Yes Weiss, Historical, MD  albuterol (ACCUNEB) 1.25 MG/3ML nebulizer solution Take 3 mLs (1.25 mg total) by nebulization every 6 (six) hours as needed for wheezing. 06/16/17  Yes Tanda Rockers, MD  albuterol (PROAIR HFA) 108 (90 Base) MCG/ACT inhaler Inhale 2 puffs every 6 (six) hours as needed into the lungs for wheezing or shortness of breath. 07/19/17  Yes Rhyne, Hulen Shouts, PA-C  amiodarone (PACERONE) 200 MG tablet Take 1 tablet (200 mg total) by mouth daily. 08/13/17  Yes Katherine Roan, MD  atorvastatin (LIPITOR) 40 MG tablet Take 0.5 tablets (20 mg total) by mouth at bedtime. 05/06/17  Yes Burnell Blanks, MD  calcium acetate (PHOSLO) 667 MG capsule Take 4,002 mg by mouth 3 (three) times daily with meals.    Yes Weiss, Historical, MD  citalopram (CELEXA) 20 MG tablet Take 20 mg by mouth at bedtime.    Yes Weiss, Historical, MD  diphenhydrAMINE (BENADRYL) 2 % cream Apply 1 application topically daily as needed for itching.   Yes Weiss, Historical, MD  docusate sodium (COLACE) 100 MG capsule Take 300 mg by mouth 2 (two) times daily.    Yes Weiss, Historical, MD  folic acid (FOLVITE) 1 MG tablet Take 1 mg by mouth daily.    Yes Weiss, Historical, MD  gabapentin (NEURONTIN) 300 MG capsule Take 300 mg by mouth at bedtime.    Yes Weiss, Historical, MD  ketoconazole (NIZORAL) 2 % shampoo Apply 1 application topically 3 (three) times a week. 08/23/17  Yes Weiss, Historical, MD  midodrine (PROAMATINE) 10 MG tablet Take 10 mg  by mouth 3 (three) times daily.   Yes Weiss, Historical, MD  multivitamin (RENA-VIT) TABS tablet Take 1 tablet by mouth daily. 01/11/13  Yes Samella Parr, NP  nitroGLYCERIN (NITROSTAT) 0.4 MG SL tablet Place 0.4 mg under the tongue every 5 (five) minutes as needed for chest pain (max 3 doses - if no relief call MD).    Yes Weiss, Historical, MD  OXYGEN 2lpm with sleep and "at home"   Yes Weiss, Historical, MD  oxymetazoline (AFRIN) 0.05 %  nasal spray Place 1 spray into both nostrils 2 (two) times daily as needed for congestion.   Yes Weiss, Historical, MD  pantoprazole (PROTONIX) 40 MG tablet Take 1 tablet (40 mg total) by mouth 2 (two) times daily. Patient taking differently: Take 40 mg by mouth daily.  02/09/17  Yes Burnell Blanks, MD  polyethylene glycol (MIRALAX / GLYCOLAX) packet Take 17 g daily as needed by mouth for mild constipation. Mix in 8 oz liquid and drink    Yes Weiss, Historical, MD  STIOLTO RESPIMAT 2.5-2.5 MCG/ACT AERS INHALE 2 PUFFS BY MOUTH DAILY 07/06/17  Yes Weiss, Historical, MD  triamcinolone cream (KENALOG) 0.1 % Apply 1 application topically 3 (three) times daily. 08/23/17  Yes Weiss, Historical, MD  warfarin (COUMADIN) 7.5 MG tablet Take 0.5-1 tablets (3.75-7.5 mg total) See admin instructions by mouth. Patient taking differently: Take 3.75 mg by mouth daily at 6 PM.  07/19/17  Yes Rhyne, Samantha J, PA-C  oxyCODONE (ROXICODONE) 5 MG immediate release tablet Take 1 tablet (5 mg total) every 6 (six) hours as needed by mouth. Patient not taking: Reported on 09/01/2017 07/19/17   Gabriel Earing, PA-C    Family History Family History  Problem Relation Age of Onset  . Heart failure Mother        started in 56s  . Emphysema Mother        smoked  . Diabetes Father   . Lupus Sister   . Heart attack Maternal Grandfather   . Heart attack Maternal Uncle   . Heart attack Maternal Aunt   . Hypertension Maternal Aunt   . Stroke Neg Hx      Social History Social History   Tobacco Use  . Smoking status: Former Smoker    Packs/day: 2.00    Years: 20.00    Pack years: 40.00    Types: Cigarettes    Last attempt to quit: 08/06/1998    Years since quitting: 19.0  . Smokeless tobacco: Never Used  Substance Use Topics  . Alcohol use: No    Alcohol/week: 0.0 oz  . Drug use: No     Allergies   Adhesive [tape]; Amoxicillin; Ativan [lorazepam]; Bupropion; Diphenhydramine; and Penicillins   Review of Systems Review of Systems  Constitutional: Positive for fatigue. Negative for chills and fever.  Respiratory: Positive for cough, shortness of breath and wheezing.   Cardiovascular: Positive for leg swelling. Negative for chest pain and palpitations.  Gastrointestinal: Negative for abdominal pain, diarrhea, nausea and vomiting.  Musculoskeletal: Negative for back pain, myalgias and neck pain.  Neurological: Negative for dizziness, light-headedness, numbness and headaches.  All other systems reviewed and are negative.    Physical Exam Updated Vital Signs BP (!) 70/51   Pulse 66   Temp (!) 97.4 F (36.3 C)   Resp (!) 24   Ht 5\' 10"  (1.778 m)   Wt 81.2 kg (179 lb)   SpO2 94%   BMI 25.68 kg/m   Physical Exam  Constitutional: He is oriented to person, place, and time. He appears well-developed and well-nourished. He appears distressed.  Ill-appearing  HENT:  Head: Normocephalic and atraumatic.  Mouth/Throat: Oropharynx is clear and moist. No oropharyngeal exudate.  Eyes: EOM are normal. Pupils are equal, round, and reactive to light.  Neck: Normal range of motion. Neck supple.  Cardiovascular: Regular rhythm.  Tachycardia  Pulmonary/Chest: He is in respiratory distress. He has wheezes.  Increased work of breathing.  Expiratory wheezing especially on the left.  Decreased breath  sounds in the right lung field.  Abdominal: Soft. Bowel sounds are normal. There is no tenderness. There is no rebound and no guarding.   Musculoskeletal: Normal range of motion. He exhibits edema. He exhibits no tenderness.  2+ bilateral lower extremity pitting edema.  No calf tenderness.  Neurological: He is alert and oriented to person, place, and time.  Moves all extremities without focal deficit.  Generalized weakness noted.  Skin: Skin is warm and dry. Capillary refill takes less than 2 seconds. No rash noted. He is not diaphoretic. No erythema.  Psychiatric: He has a normal mood and affect. His behavior is normal.  Nursing note and vitals reviewed.    ED Treatments / Results  Labs (all labs ordered are listed, but only abnormal results are displayed) Labs Reviewed  COMPREHENSIVE METABOLIC PANEL - Abnormal; Notable for the following components:      Result Value   Chloride 99 (*)    Glucose, Bld 120 (*)    BUN 30 (*)    Creatinine, Ser 5.06 (*)    Calcium 8.5 (*)    Albumin 3.1 (*)    Alkaline Phosphatase 131 (*)    Total Bilirubin 1.3 (*)    GFR calc non Af Amer 11 (*)    GFR calc Af Amer 13 (*)    All other components within normal limits  CBC WITH DIFFERENTIAL/PLATELET - Abnormal; Notable for the following components:   RBC 3.36 (*)    Hemoglobin 11.6 (*)    HCT 37.1 (*)    MCV 110.4 (*)    MCH 34.5 (*)    RDW 18.6 (*)    Platelets 90 (*)    All other components within normal limits  PROTIME-INR - Abnormal; Notable for the following components:   Prothrombin Time 27.0 (*)    All other components within normal limits  TROPONIN I - Abnormal; Notable for the following components:   Troponin I 0.07 (*)    All other components within normal limits  I-STAT ARTERIAL BLOOD GAS, ED - Abnormal; Notable for the following components:   pH, Arterial 7.191 (*)    pCO2 arterial 71.1 (*)    pO2, Arterial 69.0 (*)    Acid-base deficit 3.0 (*)    All other components within normal limits  CULTURE, BLOOD (ROUTINE X 2)  CULTURE, BLOOD (ROUTINE X 2)  URINALYSIS, ROUTINE W REFLEX MICROSCOPIC  BLOOD GAS, ARTERIAL   BLOOD GAS, ARTERIAL  I-STAT CG4 LACTIC ACID, ED  I-STAT CG4 LACTIC ACID, ED    EKG  EKG Interpretation None       Radiology Dg Chest Port 1 View  Result Date: 09/01/2017 CLINICAL DATA:  Shortness of breath and cough. EXAM: PORTABLE CHEST 1 VIEW COMPARISON:  Chest x-ray dated August 08, 2017. FINDINGS: Unchanged positioning of right internal jugular dialysis catheter. Stable cardiomegaly. Postsurgical changes related to prior CABG and AVR. Coarsened interstitial markings appear slightly more prominent when compared to prior study, particularly in the left lower lung. Bibasilar atelectasis/scarring is relatively unchanged. Blunting of the right costophrenic angle may reflect chronic scarring or small effusion, similar to prior study. The tip of the left costophrenic angle is not included in the field of view. No focal consolidation or pneumothorax. No acute osseous abnormality. IMPRESSION: Slightly more prominent interstitial markings when compared to prior study, possibly reflecting pulmonary interstitial edema. Stable bibasilar atelectasis/scarring without focal consolidation. Electronically Signed   By: Titus Dubin M.D.   On: 09/01/2017 13:15    Procedures Procedures (  including critical care time)  Medications Ordered in ED Medications  levofloxacin (LEVAQUIN) IVPB 500 mg (not administered)  albuterol (PROVENTIL,VENTOLIN) solution continuous neb (0 mg/hr Nebulization Stopped 09/01/17 1510)  methylPREDNISolone sodium succinate (SOLU-MEDROL) 125 mg/2 mL injection 125 mg (125 mg Intravenous Given 09/01/17 1257)  albuterol (PROVENTIL) (2.5 MG/3ML) 0.083% nebulizer solution 5 mg (5 mg Nebulization Given 09/01/17 1300)  levofloxacin (LEVAQUIN) IVPB 750 mg (0 mg Intravenous Stopped 09/01/17 1600)  sodium chloride 0.9 % bolus 1,000 mL (0 mLs Intravenous Stopped 09/01/17 1325)  sodium chloride 0.9 % bolus 250 mL (0 mLs Intravenous Stopped 09/01/17 1424)  sodium chloride 0.9 % bolus 250  mL (250 mLs Intravenous Transfusing/Transfer 09/01/17 1605)   CRITICAL CARE Performed by: Julianne Rice Total critical care time: 70 minutes Critical care time was exclusive of separately billable procedures and treating other patients. Critical care was necessary to treat or prevent imminent or life-threatening deterioration. Critical care was time spent personally by me on the following activities: development of treatment plan with patient and/or surrogate as well as nursing, discussions with consultants, evaluation of patient's response to treatment, examination of patient, obtaining history from patient or surrogate, ordering and performing treatments and interventions, ordering and review of laboratory studies, ordering and review of radiographic studies, pulse oximetry and re-evaluation of patient's condition.  Initial Impression / Assessment and Plan / ED Course  I have reviewed the triage vital signs and the nursing notes.  Pertinent labs & imaging results that were available during my care of the patient were reviewed by me and considered in my medical decision making (see chart for details).    Initially reported lactic acid of greater than 4 was incorrect.  Patient appears to be baseline tachycardic and hypotensive with systolic blood pressures running between 80 and 100.  He does have increased oxygen needs and is been placed on BiPAP with albuterol nebs and Solu-Medrol.  He has improved air movement.  Have given only very gentle fluid hydration given concern for overload status and desire not to be intubated.  Will discuss with teaching service regarding admission.  Discussed with internal medicine resident.  Will see patient in the emergency department and admit.  Is now more alert.  Answering questions.  States his breathing has improved.  Continues to be hypotensive.  Also has any tachydysrhythmia likely atrial flutter.  Did not take his amiodarone this morning.  Will likely  need to be restarted on the amiodarone Final Clinical Impressions(s) / ED Diagnoses   Final diagnoses:  COPD exacerbation ([place])  Hypotension, unspecified hypotension type    ED Discharge Orders    None       Julianne Rice, MD 09/01/17 [phone]

## 2017-09-01 NOTE — ED Notes (Signed)
[name] paged via [place].

## 2017-09-01 NOTE — ED Triage Notes (Signed)
Pt arrived via [facility] from home c/o SOB started last night and cough x3 days.  Pt wears 2.5L O2 [place].  EMS gave 5mg  Albuterol and 0.5mg  Atrovent.  Right chest port for dialysis M/W/F, had full treatment yesterday.  CBG 138

## 2017-09-01 NOTE — ED Notes (Addendum)
Wife at bedside. Dr. Lita Mains states to wait on transport to floor until discussed with admitting.

## 2017-09-01 NOTE — Progress Notes (Signed)
Admission from the ED awake and alert.

## 2017-09-01 NOTE — ED Notes (Signed)
Dr. Autumn Messing contacted and she states to give po medications before trans port to floor and does confirm stepdown placement. Chaps, RN contacted onfloor to inform.

## 2017-09-01 NOTE — ED Notes (Signed)
BP noted to Dr. Lita Mains. States to give 250 cc NS bolus.

## 2017-09-01 NOTE — ED Notes (Signed)
Wife just informed tech that Pt does not produce any urine anymore whatsoever and that the condom catheter is unnecessary.

## 2017-09-01 NOTE — ED Notes (Addendum)
Attempted to call reportafter Dr. Lita Mains states pt can go.

## 2017-09-01 NOTE — H&P (Signed)
Date: 09/01/2017               Patient Name:  [name] MRN: [id number]  DOB: [birth year] Age / Sex: 61 y.o., male   PCP: Algis Greenhouse, MD         Medical Service: Internal Medicine Teaching Service         Attending Physician: Dr. Lucious Groves, DO    First Contact: Dr. Trilby Drummer Pager: [phone]  Second Contact: Dr. Hetty Ely Pager: [phone]       After Hours (After 5p/  First Contact Pager: [phone]  weekends / holidays): Second Contact Pager: [phone]   Chief Complaint: Shortness of breath  History of Present Illness: [name] is a 61 yo M with a history of Multiple Sclerosis, CAD (s/p CABGx2), A. Fib/flutter, Hypotension, OSA, COPD, CHF (EF 55-60%, 08/03/17), GERD, and thrombocytopenia who presented with Shortness of breath. Patient on BiPAP and was drowsy in the ED and history was obtained from the patient with the assistance of patient's wife.  Patient has been experiencing 2 days of increasing shortness of breath with increasing cough frequency and sputum production. He has also been experiecing increased abdominal distention and groin swelling per his wife. He has had an increasing oxygen requirement for the past two days as well and had increased his home oxygen from 2 to 4 liters. His wife reports that even at 4L he was saturating in the high 80s on their home pulse ox. Patient denies fevers, chills, Chest pain, Orthopnea, body aches, light headedness, abdominal pain, or changes to his bowl or bladder habits. He denies sick contacts; he has been around his grand kids (though they were not sick).  In the ED, BP 70s-80s/50s-60s, HR 100s-110s, RR 20s, and Saturation of 94% on BiPAP. CBC showed stable HGB 11.6, Chronic thrombocytopenia stable at 90; CMP showed changes consistent with ESRD, Alk Phos 131; PT/INR showed elevated PT @ 27; Troponin 0.07 (elevated); Lactic acid normal x 2. ABG showed pH 7.191, pCO2 71.2, PO2 69. U/A was obtained. CXR showed Slightly more prominent  interstitial markings and stable bibasilar atelectasis/scarring. Patient was placed on BiPAP and received Solumedrol, Albuterol neb x 2, 1L Fluids and IV Levofloxacin. Patient to be admitted for further workup and care.  Meds:  Current Meds  Medication Sig  . acetaminophen (TYLENOL) 500 MG tablet Take 1,000 mg every 6 (six) hours as needed by mouth for mild pain.   Marland Kitchen albuterol (ACCUNEB) 1.25 MG/3ML nebulizer solution Take 3 mLs (1.25 mg total) by nebulization every 6 (six) hours as needed for wheezing.  Marland Kitchen albuterol (PROAIR HFA) 108 (90 Base) MCG/ACT inhaler Inhale 2 puffs every 6 (six) hours as needed into the lungs for wheezing or shortness of breath.  Marland Kitchen amiodarone (PACERONE) 200 MG tablet Take 1 tablet (200 mg total) by mouth daily.  Marland Kitchen atorvastatin (LIPITOR) 40 MG tablet Take 0.5 tablets (20 mg total) by mouth at bedtime.  . calcium acetate (PHOSLO) 667 MG capsule Take 4,002 mg by mouth 3 (three) times daily with meals.   . citalopram (CELEXA) 20 MG tablet Take 20 mg by mouth at bedtime.   . diphenhydrAMINE (BENADRYL) 2 % cream Apply 1 application topically daily as needed for itching.  . docusate sodium (COLACE) 100 MG capsule Take 300 mg by mouth 2 (two) times daily.   . folic acid (FOLVITE) 1 MG tablet Take 1 mg by mouth daily.   Marland Kitchen gabapentin (NEURONTIN) 300 MG capsule Take 300 mg  by mouth at bedtime.   Marland Kitchen ketoconazole (NIZORAL) 2 % shampoo Apply 1 application topically 3 (three) times a week.  . midodrine (PROAMATINE) 10 MG tablet Take 10 mg by mouth 3 (three) times daily.  . multivitamin (RENA-VIT) TABS tablet Take 1 tablet by mouth daily.  . nitroGLYCERIN (NITROSTAT) 0.4 MG SL tablet Place 0.4 mg under the tongue every 5 (five) minutes as needed for chest pain (max 3 doses - if no relief call MD).   . OXYGEN 2lpm with sleep and "at home"  . oxymetazoline (AFRIN) 0.05 % nasal spray Place 1 spray into both nostrils 2 (two) times daily as needed for congestion.  . pantoprazole (PROTONIX) 40  MG tablet Take 1 tablet (40 mg total) by mouth 2 (two) times daily. (Patient taking differently: Take 40 mg by mouth daily. )  . polyethylene glycol (MIRALAX / GLYCOLAX) packet Take 17 g daily as needed by mouth for mild constipation. Mix in 8 oz liquid and drink   . STIOLTO RESPIMAT 2.5-2.5 MCG/ACT AERS INHALE 2 PUFFS BY MOUTH DAILY  . triamcinolone cream (KENALOG) 0.1 % Apply 1 application topically 3 (three) times daily.  Marland Kitchen warfarin (COUMADIN) 7.5 MG tablet Take 0.5-1 tablets (3.75-7.5 mg total) See admin instructions by mouth. (Patient taking differently: Take 3.75 mg by mouth daily at 6 PM. )    Allergies: Allergies as of 09/01/2017 - Review Complete 09/01/2017  Allergen Reaction Noted  . Adhesive [tape] Rash 02/12/2012  . Amoxicillin Rash 01/29/2016  . Ativan [lorazepam] Anxiety 09/10/2016  . Bupropion Anxiety 12/05/2016  . Diphenhydramine Itching and Anxiety 12/04/2012  . Penicillins Other (See Comments) 02/12/2012   Past Medical History:  Diagnosis Date  . Acute blood loss anemia    a. 02/2016 due to groin hematoma. Rec 3 U PRBC.  Marland Kitchen Anemia   . Anxiety   . Aortic heart valve prolapse 04/2013   a. s/p bioprosthetic AVR at time of CABG.  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 Bifascicular block   . CAD (coronary artery disease) 04/2013   a. 04/2013: s/p CABG x 2 (Y SVG -LAD & D2). b. 01/2015: NSTEMI s/p DES to LCx, BMS to SVG-LAD. c. NSTEMI 05/2015 during AF/AFL - non-flow limiting FFR. d. Low risk nuc 3/17. e. STEMI 6/17 after coming off Plavix, s/p DES to SVG-LAD into native vessel.  . Cancer ([place])    lung cancer - 5 doses of radiation, F/U in Dec. 2018  . Carotid artery disease (HCC)    a. 1-39% stenosis bilaterally in 06/2015.   Marland Kitchen CHF (congestive heart failure) ([place])   . Chronic respiratory failure ([place])   . COPD (chronic obstructive pulmonary disease) ([place])   . Dyspnea   . 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  b. He had a renal Tx from 2008 to 2011, but subsequent rejection - Gets HD in [place], [place] on MWF schedule.    Marland Kitchen GERD (gastroesophageal reflux disease)   . Hematoma    a. Large right groin hematoma after cath 02/2016 with associated ABL anemia.  . Hypertension   . Multiple sclerosis ([place])   . Myocardial infarction ([place])   . Nocturnal hypoxemia   . On home oxygen therapy    pt states he has not been wearing it  . PAF (paroxysmal atrial fibrillation) ([place])    a. h/o, placed on amiodarone 07/2016 due to recurrence.  . Paroxysmal atrial flutter ([place])  a. During 05/2015 admission - SVT, atrial flutter, and PAF.  Marland Kitchen Peripheral vascular disease ([place])    Cath in 01/2015 required 45 cm Destination Sheath  . Pneumonia   . Renal transplant failure and rejection   . 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 intolerant of bipap. Sleeps with O2 at 2 L  . SVT (supraventricular tachycardia) ([place])   . Valvular heart disease    a. 2D Echo 03/25/16: mild LVH, EF 50-55%, grade 2 Dd, AVR present with mild AI, mod MR, severely dilated LA, mildly dilated RV, mod RAE, PASP 72.    Family History: Family History  Problem Relation Age of Onset  . Heart failure Mother        started in 75s  . Emphysema Mother        smoked  . Diabetes Father   . Lupus Sister   . Heart attack Maternal Grandfather   . Heart attack Maternal Uncle   . Heart attack Maternal Aunt   . Hypertension Maternal Aunt   . Stroke Neg Hx   - Reviewed on Admission  Social History:  Social History   Tobacco Use  . Smoking status: Former Smoker    Packs/day: 2.00    Years: 20.00    Pack years: 40.00    Types: Cigarettes    Last attempt to quit: 08/06/1998    Years since quitting: 19.0  . Smokeless tobacco: Never Used  Substance Use Topics  . Alcohol use: No    Alcohol/week: 0.0 oz  . Drug use: No  - Reviewed on Admission  Review of Systems: A complete ROS was negative except as per  HPI.  Physical Exam: Blood pressure (!) 69/54, pulse 68, temperature (!) 97.4 F (36.3 C), resp. rate (!) 24, height 5' 10"  (1.778 m), weight 179 lb (81.2 kg), SpO2 94 %. Physical Exam  Constitutional: He appears well-developed and well-nourished.  HENT:  Head: Normocephalic and atraumatic.  Eyes: EOM are normal. Right eye exhibits no discharge. Left eye exhibits no discharge.  Cardiovascular: Normal rate, regular rhythm and normal heart sounds.  Pulmonary/Chest: He is in respiratory distress.  Difficult to access as patient was on BiPAP Increased work of breathing  Abdominal: Soft. Bowel sounds are normal. There is no tenderness.  Distended abdomen  Musculoskeletal:  Sacral Edema RLE 1+ Edema LLE trace edema  Neurological: He is alert.  Skin: Skin is warm and dry.   CXR: personally reviewed and I agree with: IMPRESSION: Slightly more prominent interstitial markings when compared to prior study, possibly reflecting pulmonary interstitial edema. Stable bibasilar atelectasis/scarring without focal consolidation.  Assessment & Plan by Problem:  Mr. Cropper is a 61 yo M with a history of Multiple Sclerosis, CAD (s/p CABGx2), A. Fib/flutter, Hypotension, OSA, COPD, CHF (EF 55-60%, 08/03/17), GERD, and thrombocytopenia who presented with Shortness of breath.  Acute on Chronic Respiratory failure COPD Exacerbation: Patient with increased SOB, Increased oxygen demand, and Increased sputum production. CXR showed Slightly more prominent interstitial markings and stable bibasilar atelectasis/scarring. Received Solumedrol, Albuterol neb x 2, 1L Fluids and IV Levofloxacin in the ED (for HCAP coverage). Suspect COPD as primary driver of A/C Respiratory Failure, but will continue antibiotic to cover for HCAP. - Duoneb q4h - Solumedrol 76m q6h - Levofloxacin 5016mIV q48h - BiPAP - Cardiac Monitoring - Respiratory Viral Panel  Hypotension: Chronic, On midodrine 1021mID. - Continue home  Midodrine 75m TID  ESRD on HD MWF: Last dialysis 08/31/17.  - Consult Nephrology for dialysis - Phoslo TID AC  A. Fib: On Warfarin - Warfarin per pharmacy - Continue home amiodarone 2074mdaily  FEN: NPO DVT Prophylaxis: On Warfarin Code Status: DNR  Dispo: Admit patient to Inpatient with expected length of stay greater than 2 midnights.  Signed: MeNeva SeatMD 09/01/2017, 4:31 PM  Pager: [phone]

## 2017-09-02 DIAGNOSIS — Z87891 Personal history of nicotine dependence: Secondary | ICD-10-CM

## 2017-09-02 DIAGNOSIS — R7989 Other specified abnormal findings of blood chemistry: Secondary | ICD-10-CM

## 2017-09-02 DIAGNOSIS — I5030 Unspecified diastolic (congestive) heart failure: Secondary | ICD-10-CM

## 2017-09-02 DIAGNOSIS — I9589 Other hypotension: Secondary | ICD-10-CM

## 2017-09-02 DIAGNOSIS — R791 Abnormal coagulation profile: Secondary | ICD-10-CM

## 2017-09-02 DIAGNOSIS — B9781 Human metapneumovirus as the cause of diseases classified elsewhere: Secondary | ICD-10-CM

## 2017-09-02 DIAGNOSIS — I132 Hypertensive heart and chronic kidney disease with heart failure and with stage 5 chronic kidney disease, or end stage renal disease: Secondary | ICD-10-CM

## 2017-09-02 DIAGNOSIS — J123 Human metapneumovirus pneumonia: Secondary | ICD-10-CM | POA: Diagnosis present

## 2017-09-02 DIAGNOSIS — Z836 Family history of other diseases of the respiratory system: Secondary | ICD-10-CM

## 2017-09-02 DIAGNOSIS — Z8249 Family history of ischemic heart disease and other diseases of the circulatory system: Secondary | ICD-10-CM

## 2017-09-02 LAB — PROTIME-INR
INR: 3.17
PROTHROMBIN TIME: 32.3 s — AB (ref 11.4–15.2)

## 2017-09-02 MED ORDER — ORAL CARE MOUTH RINSE
15.0000 mL | Freq: Two times a day (BID) | OROMUCOSAL | Status: DC
  Administered 2017-09-02 – 2017-09-08 (×8): 15 mL via OROMUCOSAL

## 2017-09-02 MED ORDER — WARFARIN SODIUM 2 MG PO TABS
2.0000 mg | ORAL_TABLET | Freq: Once | ORAL | Status: AC
  Administered 2017-09-02: 2 mg via ORAL
  Filled 2017-09-02: qty 1

## 2017-09-02 MED ORDER — IPRATROPIUM-ALBUTEROL 0.5-2.5 (3) MG/3ML IN SOLN
3.0000 mL | Freq: Four times a day (QID) | RESPIRATORY_TRACT | Status: DC
  Administered 2017-09-02 – 2017-09-04 (×5): 3 mL via RESPIRATORY_TRACT
  Filled 2017-09-02 (×6): qty 3

## 2017-09-02 MED ORDER — METHYLPREDNISOLONE SODIUM SUCC 125 MG IJ SOLR
80.0000 mg | Freq: Two times a day (BID) | INTRAMUSCULAR | Status: DC
  Administered 2017-09-02 – 2017-09-03 (×2): 80 mg via INTRAVENOUS
  Filled 2017-09-02 (×2): qty 2

## 2017-09-02 MED ORDER — GUAIFENESIN ER 600 MG PO TB12
600.0000 mg | ORAL_TABLET | Freq: Two times a day (BID) | ORAL | Status: DC
  Administered 2017-09-02 – 2017-09-08 (×10): 600 mg via ORAL
  Filled 2017-09-02 (×10): qty 1

## 2017-09-02 NOTE — Discharge Summary (Signed)
Name: Akhil Piscopo MRN: [id number] DOB: 1955/09/27 61 y.o. PCP: Algis Greenhouse, MD  Date of Admission: 09/01/2017 11:52 AM Date of Discharge: 09/08/2017 Attending Physician: Annia Belt, MD  Discharge Diagnosis:  Principal Problem:   Acute on chronic respiratory failure with hypoxia and hypercapnia ([place]) Active Problems:   ESRD on dialysis ([place])   Chronic diastolic congestive heart failure (HCC)   COPD exacerbation (HCC)   Human metapneumovirus (hMPV) pneumonia  Discharge Medications: Allergies as of 09/08/2017      Reactions   Adhesive [tape] Rash   Please use paper tape   Amoxicillin Rash   migraine   Ativan [lorazepam] Anxiety   Bupropion Anxiety   Diphenhydramine Itching, Anxiety   Only with IV doses. Tolerates oral.   Penicillins Other (See Comments)   MIGRAINE Has patient had a PCN reaction causing immediate rash, facial/tongue/throat swelling, SOB or lightheadedness with hypotension: no Has patient had a PCN reaction causing severe rash involving mucus membranes or skin necrosis: No Has patient had a PCN reaction that required hospitalization No Has patient had a PCN reaction occurring within the last 10 years: No If all of the above answers are "NO", then may proceed with Cephalosporin use.      Medication List    TAKE these medications   acetaminophen 500 MG tablet Commonly known as:  TYLENOL Take 1,000 mg every 6 (six) hours as needed by mouth for mild pain.   albuterol 1.25 MG/3ML nebulizer solution Commonly known as:  ACCUNEB Take 3 mLs (1.25 mg total) by nebulization every 6 (six) hours as needed for wheezing.   albuterol 108 (90 Base) MCG/ACT inhaler Commonly known as:  PROAIR HFA Inhale 2 puffs every 6 (six) hours as needed into the lungs for wheezing or shortness of breath.   amiodarone 200 MG tablet Commonly known as:  PACERONE Take 1 tablet (200 mg total) by mouth daily.   atorvastatin 40 MG tablet Commonly known as:  LIPITOR Take  0.5 tablets (20 mg total) by mouth at bedtime.   calcium acetate 667 MG capsule Commonly known as:  PHOSLO Take 4,002 mg by mouth 3 (three) times daily with meals.   citalopram 20 MG tablet Commonly known as:  CELEXA Take 20 mg by mouth at bedtime.   diazepam 5 MG tablet Commonly known as:  VALIUM Take 1 tablet (5 mg total) by mouth every 6 (six) hours as needed for anxiety, muscle spasms or sedation.   diphenhydrAMINE 2 % cream Commonly known as:  BENADRYL Apply 1 application topically daily as needed for itching.   docusate sodium 100 MG capsule Commonly known as:  COLACE Take 300 mg by mouth 2 (two) times daily.   folic acid 1 MG tablet Commonly known as:  FOLVITE Take 1 mg by mouth daily.   gabapentin 300 MG capsule Commonly known as:  NEURONTIN Take 300 mg by mouth at bedtime.   HYDROmorphone 2 MG tablet Commonly known as:  DILAUDID Take 0.5 tablets (1 mg total) by mouth 3 (three) times daily.   hydrOXYzine 25 MG tablet Commonly known as:  ATARAX/VISTARIL Take 1 tablet (25 mg total) by mouth 3 (three) times daily as needed for itching.   ketoconazole 2 % shampoo Commonly known as:  NIZORAL Apply 1 application topically 3 (three) times a week.   midodrine 10 MG tablet Commonly known as:  PROAMATINE Take 10 mg by mouth 3 (three) times daily.   multivitamin Tabs tablet Take 1 tablet by mouth daily.  nitroGLYCERIN 0.4 MG SL tablet Commonly known as:  NITROSTAT Place 0.4 mg under the tongue every 5 (five) minutes as needed for chest pain (max 3 doses - if no relief call MD).   oxyCODONE 5 MG immediate release tablet Commonly known as:  ROXICODONE Take 1 tablet (5 mg total) every 6 (six) hours as needed by mouth.   OXYGEN 2lpm with sleep and "at home"   oxymetazoline 0.05 % nasal spray Commonly known as:  AFRIN Place 1 spray into both nostrils 2 (two) times daily as needed for congestion.   pantoprazole 40 MG tablet Commonly known as:  PROTONIX Take 1  tablet (40 mg total) by mouth 2 (two) times daily. What changed:  when to take this   polyethylene glycol packet Commonly known as:  MIRALAX / GLYCOLAX Take 17 g daily as needed by mouth for mild constipation. Mix in 8 oz liquid and drink   predniSONE 20 MG tablet Commonly known as:  DELTASONE Take 3 tablets (60 mg total) by mouth daily with breakfast for 7 days, THEN 2 tablets (40 mg total) daily with breakfast for 7 days, THEN 1 tablet (20 mg total) daily with breakfast for 7 days, THEN 0.5 tablets (10 mg total) daily with breakfast. Start taking on:  09/08/2017   STIOLTO RESPIMAT 2.5-2.5 MCG/ACT Aers Generic drug:  Tiotropium Bromide-Olodaterol INHALE 2 PUFFS BY MOUTH DAILY   triamcinolone cream 0.1 % Commonly known as:  KENALOG Apply 1 application topically 3 (three) times daily.   warfarin 7.5 MG tablet Commonly known as:  COUMADIN Take as directed. If you are unsure how to take this medication, talk to your nurse or doctor. Original instructions:  Take 0.5-1 tablets (3.75-7.5 mg total) See admin instructions by mouth. What changed:    how much to take  when to take this            Durable Medical Equipment  (From admission, onward)        Start     Ordered   09/06/17 1254  For home use only DME Other see comment  Once    Comments:  Please try an oxymizer for transition off bipap   09/06/17 1254      Disposition and follow-up:   Mr.Hiroyuki Caterino was discharged from [facility] in [place] condition.  At the hospital follow up visit please address:  1.  Patient admitted with COPD exacerbation 2/2 metapneumovirus. He had a complicated hospital course as outlined below. Given worsening respiratory status, lung cancer, and ESRD the patient had discussions with palliative care while inpatient. He decided to continue dialysis and be discharged home with home health PT/OT and outpatient palliative care follow up. Please follow up with the patient regarding  these services and help facilitate outpatient palliative care. He was given prescription for valium and dilaudid for air hunger while inpatient, please refill prescriptions if patient has yet to establish with outpatient palliative care.  2.  Labs / imaging needed at time of follow-up: INR  3.  Pending labs/ test needing follow-up: None  Follow-up Appointments: Follow-up Information    Dough, Jaymes Graff, MD. Call in 1 day(s).   Specialty:  Family Medicine Why:  Please follow up with your primary care physician; he can help facilitate outpatient palliative care. Please take your warfarin as you were before you came to the hospital. You will have to get your INR checked within 2-5 days of leaving hospital. Contact information: [address] [place] [address]  [place]. Call in 1 week(s).   Why:  Please call these services to arrange follow up as an outpatient. Your primary care physician will be able to help with this if needed. Contact information: [address] [place] [address] [phone]        Tanda Rockers, MD. Call in 2 week(s).   Specialty:  Pulmonary Disease Why:  Please follow up with Dr. Melvyn Novas, your pulmonologist after hospitalization. You can discuss further care of your chronic medical conditions with him at this follow up. Contact information: [address] [place] [address] [phone]           Hospital Course by problem list: Principal Problem:   Acute on chronic respiratory failure with hypoxia and hypercapnia (HCC) Active Problems:   ESRD on dialysis ([place])   Chronic diastolic congestive heart failure (HCC)   COPD exacerbation (HCC)   Human metapneumovirus (hMPV) pneumonia   1. COPD exacerbation Likely secondary to metapneumovirus. Patient admitted with increased shortness of breath, sputum production, and increased oxygen requirement. Initially on BiPAP, which was weaned as tolerated. CXR  with slightly more prominent interstitial markings. Levaquin initially started for possible HCAP coverage. RVP positive for metapneumovirus, so antibiotics were discontinued. Treated with duonebs and IV steroids which were transitioned to PO. Also received HD on 12/28 and 12/29 per Nephrology. He was improving but experienced an acute worsening in dyspnea and oxygen saturation on the morning of 12/30. He was noted to be more somnolent, likely secondary to hypercapnia confirmed on ABG, so he was placed on BiPAP. CXR unchanged. Given acute worsening, IV steroids and IV Levaquin were restarted. He was stable on BiPAP. Palliative Medicine team was consulted to assist with symptomatic management during his hospital admission. They started medications for air hunger and anxiety, which included PO dilaudid and valium. He tolerated these medications well and was discharged with these prescriptions. Patient was also switched from BiPAP to Oxymizer to allow him to be more interactive and to eat. Hospice services were discussed and the decision was made to go home with home health PT/OT and outpatient palliative care, as the patient did not wish to discontinue dialysis at this time. The patient and family will follow up with [facility].   2. ESRD on HD MWF Received hemodialysis throughout hospitalization. He was discharged home with instructions to return to dialysis as previously scheduled. Patient's wife states that they will likely discontinue dialysis and move towards hospice when patient is no longer strong enough to tolerate dialysis sessions.   Discharge Vitals:   BP 102/80 (BP Location: Right Arm)   Pulse (!) 115   Temp 98.6 F (37 C)   Resp 20   Ht 5\' 10"  (1.778 m)   Wt 151 lb 0.2 oz (68.5 kg)   SpO2 95%   BMI 21.67 kg/m   Pertinent Labs, Studies, and Procedures:  CBC Latest Ref Rng & Units 09/07/2017 09/05/2017 09/03/2017  WBC 4.0 - 10.5 K/uL 9.9 6.9 12.2(H)  Hemoglobin 13.0 - 17.0 g/dL  11.0(L) 11.7(L) 10.8(L)  Hematocrit 39.0 - 52.0 % 35.3(L) 38.0(L) 36.1(L)  Platelets 150 - 400 K/uL 93(L) 89(L) 109(L)   CMP Latest Ref Rng & Units 09/07/2017 09/05/2017 09/03/2017  Glucose 65 - 99 mg/dL 128(H) 98 115(H)  BUN 6 - 20 mg/dL 57(H) 49(H) 32(H)  Creatinine 0.61 - 1.24 mg/dL 4.95(H) 5.05(H) 4.78(H)  Sodium 135 - 145 mmol/L 138 136 133(L)  Potassium 3.5 - 5.1 mmol/L 4.0 4.7 4.5  Chloride 101 - 111 mmol/L 98(L) 96(L) 95(L)  CO2 22 - 32 mmol/L 27 24 27   Calcium 8.9 - 10.3 mg/dL 8.1(L) 8.7(L) 7.9(L)  Total Protein 6.5 - 8.1 g/dL - - -  Total Bilirubin 0.3 - 1.2 mg/dL - - -  Alkaline Phos 38 - 126 U/L - - -  AST 15 - 41 U/L - - -  ALT 17 - 63 U/L - - -   RVP positive for metapneumovirus Flu negative Lactic acid 1.37 Trop 0.07 BCx pending  INR on 09/08/2017 = 3.00  CXR 09/01/2017 Slightly more prominent interstitial markings when compared to prior study, possibly reflecting pulmonary interstitial edema. Stable bibasilar atelectasis/scarring without focal consolidation.  CXR 09/04/2017 Stable changes when compared with the prior exam.  Discharge Instructions: Discharge Instructions    Call MD for:  persistant nausea and vomiting   Complete by:  As directed    Call MD for:  redness, tenderness, or signs of infection (pain, swelling, redness, odor or green/yellow discharge around incision site)   Complete by:  As directed    Call MD for:  severe uncontrolled pain   Complete by:  As directed    Call MD for:  temperature >100.4   Complete by:  As directed    Diet - low sodium heart healthy   Complete by:  As directed    Discharge instructions   Complete by:  As directed    Please follow up with your primary care provider as soon as possible after leaving the hospital. This physician can go over your medications and make sure that you have everything you need while at home. He will also have to check your INR after leaving the hospital to make sure your dose of warfarin is  appropriate. He can also provide refills on as needed medications until you get setup with outpatient palliative care follow up. Please use the numbers on this paperwork to set up outpatient palliative care follow up. Please also follow up with your pulmonologist Dr. Melvyn Novas regarding your recent hospitalization and lung cancer diagnosis.   Increase activity slowly   Complete by:  As directed       Signed: Thomasene Ripple, MD 09/08/2017, 6:06 PM

## 2017-09-02 NOTE — Progress Notes (Signed)
Infection prevention  made aware about the + resp. panel with metapneumovirus  who claimed that there is no need for isolation.

## 2017-09-02 NOTE — Consult Note (Signed)
Renal Service Consult Note [facility]  [name] 09/02/2017 [place] Blazing Requesting Physician:  Dr Johnnette Gourd  Reason for Consult:  ESRD pt with dyspnea HPI: The patient is a 61 y.o. year-old presented to ED w/ SOB and cough.  Cough nonprod, whitish phlegm, no fevers or chills and no myalgias.  Normally wears 2L [place] at home, increased to 4L [place] as tolerated.  Last HD was the day before presenting here.  In ED CXR showed IS edema.  Asked to see for ESRD.    Patient had ESRD in year 2000, started HD then.  In 2008 had renal TX which lasted until 2010 (2 yrs) then he ended up back on dialysis.  He was admitted here very ill 1-2 mos ago for acute/ chronic hypotension after L AVF placement.  Pt was made DNR due to severe comorbidities.  Also L AVF was ligated and pt was dc'd to SNF.  Now pt says his BP is a "whole better" since the AVF was ligated.    Denies any CP, abd pain, n/v/d.  No recent HD cath issues.   ROS  denies CP  no joint pain   no HA  no blurry vision  no rash  no diarrhea  no nausea/ vomiting   Past Medical History  Past Medical History:  Diagnosis Date  . Acute blood loss anemia    a. 02/2016 due to groin hematoma. Rec 3 U PRBC.  Marland Kitchen Anemia   . Anxiety   . Aortic heart valve prolapse 04/2013   a. s/p bioprosthetic AVR at time of CABG.  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 Bifascicular block   . CAD (coronary artery disease) 04/2013   a. 04/2013: s/p CABG x 2 (Y SVG -LAD & D2). b. 01/2015: NSTEMI s/p DES to LCx, BMS to SVG-LAD. c. NSTEMI 05/2015 during AF/AFL - non-flow limiting FFR. d. Low risk nuc 3/17. e. STEMI 6/17 after coming off Plavix, s/p DES to SVG-LAD into native vessel.  . Cancer ([place])    lung cancer - 5 doses of radiation, F/U in Dec. 2018  . Carotid artery disease (HCC)    a. 1-39% stenosis bilaterally in 06/2015.   Marland Kitchen CHF (congestive heart failure) ([place])   . Chronic respiratory failure ([place])   . COPD (chronic  obstructive pulmonary disease) ([place])   . Dyspnea   . 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 b. He had a renal Tx from 2008 to 2011, but subsequent rejection - Gets HD in [place], [place] on MWF schedule.    Marland Kitchen GERD (gastroesophageal reflux disease)   . Hematoma    a. Large right groin hematoma after cath 02/2016 with associated ABL anemia.  . Hypertension   . Multiple sclerosis ([place])   . Myocardial infarction ([place])   . Nocturnal hypoxemia   . On home oxygen therapy    pt states he has not been wearing it  . PAF (paroxysmal atrial fibrillation) ([place])    a. h/o, placed on amiodarone 07/2016 due to recurrence.  . Paroxysmal atrial flutter ([place])    a. During 05/2015 admission - SVT, atrial flutter, and PAF.  Marland Kitchen Peripheral vascular disease ([place])    Cath in 01/2015 required 45 cm [place]  . Pneumonia   . Renal transplant failure and rejection   . S/P CABG x 2 04/2013   s/p CABG x 2 (Y SVG -LAD & D2);   .  Sleep apnea    a. intolerant of bipap. Sleeps with O2 at 2 L  . SVT (supraventricular tachycardia) ([place])   . Valvular heart disease    a. 2D Echo 03/25/16: mild LVH, EF 50-55%, grade 2 Dd, AVR present with mild AI, mod MR, severely dilated LA, mildly dilated RV, mod RAE, PASP 72.   Past Surgical History  Past Surgical History:  Procedure Laterality Date  . APPENDECTOMY    . AV FISTULA PLACEMENT    . AV FISTULA PLACEMENT Left 07/19/2017   Procedure: LEFT BRACHIOCEPHALIC ARTERIOVENOUS FISTULA CREATION;  Surgeon: Elam Dutch, MD;  Location: [place];  Service: Vascular;  Laterality: Left;  . CARDIAC CATHETERIZATION N/A 05/26/2015   Procedure: Left Heart Cath and Coronary Angiography;  Surgeon: Leonie Man, MD;  Location: [place] CV LAB;  Service: Cardiovascular;  Laterality: N/A;  . CARDIAC CATHETERIZATION N/A 05/26/2015   Procedure: Intravascular Pressure Wire/FFR Study;  Surgeon: Leonie Man, MD;  Location: [place] CV LAB;   Service: Cardiovascular;  Laterality: N/A;  . CARDIAC CATHETERIZATION N/A 02/15/2016   Procedure: Left Heart Cath and Coronary Angiography;  Surgeon: Wellington Hampshire, MD;  Location: [place] CV LAB;  Service: Cardiovascular;  Laterality: N/A;  . CARDIOVERSION N/A 07/12/2016   Procedure: CARDIOVERSION;  Surgeon: Minus Breeding, MD;  Location: [facility] ENDOSCOPY;  Service: Cardiovascular;  Laterality: N/A;  . CARDIOVERSION N/A 11/11/2016   Procedure: CARDIOVERSION;  Surgeon: Fay Records, MD;  Location: [place];  Service: Cardiovascular;  Laterality: N/A;  . CARDIOVERSION N/A 12/09/2016   Procedure: CARDIOVERSION;  Surgeon: Sanda Klein, MD;  Location: [facility] ENDOSCOPY;  Service: Cardiovascular;  Laterality: N/A;  . COLONOSCOPY    . CORONARY ANGIOPLASTY    . CORONARY ARTERY BYPASS GRAFT    . ESOPHAGOGASTRODUODENOSCOPY ENDOSCOPY    . excised squamous cells at rectum  2006  . flash     flash pulmonary edema  . HERNIA REPAIR  10/7251   umbilical hernia  . KIDNEY TRANSPLANT  08/2007  . KNEE SURGERY    . LEFT HEART CATHETERIZATION WITH CORONARY ANGIOGRAM N/A 01/09/2013   Procedure: LEFT HEART CATHETERIZATION WITH CORONARY ANGIOGRAM;  Surgeon: Burnell Blanks, MD;  Location: [facility] CATH LAB;  Service: Cardiovascular;  Laterality: N/A;  . LIGATION OF ARTERIOVENOUS  FISTULA Left 08/10/2017   Procedure: LIGATION OF ARTERIOVENOUS  FISTULA;  Surgeon: Elam Dutch, MD;  Location: [place];  Service: Vascular;  Laterality: Left;  . PARATHYROIDECTOMY    . TEE WITHOUT CARDIOVERSION N/A 11/11/2016   Procedure: TRANSESOPHAGEAL ECHOCARDIOGRAM (TEE);  Surgeon: Fay Records, MD;  Location: [place];  Service: Cardiovascular;  Laterality: N/A;  . TEE WITHOUT CARDIOVERSION N/A 12/09/2016   Procedure: TRANSESOPHAGEAL ECHOCARDIOGRAM (TEE);  Surgeon: Sanda Klein, MD;  Location: [facility] ENDOSCOPY;  Service: Cardiovascular;  Laterality: N/A;   Family History  Family History  Problem Relation Age of Onset  . Heart  failure Mother        started in 8s  . Emphysema Mother        smoked  . Diabetes Father   . Lupus Sister   . Heart attack Maternal Grandfather   . Heart attack Maternal Uncle   . Heart attack Maternal Aunt   . Hypertension Maternal Aunt   . Stroke Neg Hx    Social History  reports that he quit smoking about 19 years ago. His smoking use included cigarettes. He has a 40.00 pack-year smoking history. he has never used smokeless tobacco. He reports  that he does not drink alcohol or use drugs. Allergies  Allergies  Allergen Reactions  . Adhesive [Tape] Rash    Please use paper tape  . Amoxicillin Rash    migraine  . Ativan [Lorazepam] Anxiety  . Bupropion Anxiety  . Diphenhydramine Itching and Anxiety    Only with IV doses. Tolerates oral.  . Penicillins Other (See Comments)    MIGRAINE  Has patient had a PCN reaction causing immediate rash, facial/tongue/throat swelling, SOB or lightheadedness with hypotension: no Has patient had a PCN reaction causing severe rash involving mucus membranes or skin necrosis: No Has patient had a PCN reaction that required hospitalization No Has patient had a PCN reaction occurring within the last 10 years: No If all of the above answers are "NO", then may proceed with Cephalosporin use.   Home medications Prior to Admission medications   Medication Sig Start Date End Date Taking? Authorizing Provider  acetaminophen (TYLENOL) 500 MG tablet Take 1,000 mg every 6 (six) hours as needed by mouth for mild pain.    Yes [provider]  albuterol (ACCUNEB) 1.25 MG/3ML nebulizer solution Take 3 mLs (1.25 mg total) by nebulization every 6 (six) hours as needed for wheezing. 06/16/17  Yes Tanda Rockers, MD  albuterol (PROAIR HFA) 108 (90 Base) MCG/ACT inhaler Inhale 2 puffs every 6 (six) hours as needed into the lungs for wheezing or shortness of breath. 07/19/17  Yes Rhyne, Hulen Shouts, PA-C  amiodarone (PACERONE) 200 MG tablet Take 1 tablet (200  mg total) by mouth daily. 08/13/17  Yes Katherine Roan, MD  atorvastatin (LIPITOR) 40 MG tablet Take 0.5 tablets (20 mg total) by mouth at bedtime. 05/06/17  Yes Burnell Blanks, MD  calcium acetate (PHOSLO) 667 MG capsule Take 4,002 mg by mouth 3 (three) times daily with meals.    Yes [provider]  citalopram (CELEXA) 20 MG tablet Take 20 mg by mouth at bedtime.    Yes [provider]  diphenhydrAMINE (BENADRYL) 2 % cream Apply 1 application topically daily as needed for itching.   Yes [provider]  docusate sodium (COLACE) 100 MG capsule Take 300 mg by mouth 2 (two) times daily.    Yes [provider]  folic acid (FOLVITE) 1 MG tablet Take 1 mg by mouth daily.    Yes [provider]  gabapentin (NEURONTIN) 300 MG capsule Take 300 mg by mouth at bedtime.    Yes [provider]  ketoconazole (NIZORAL) 2 % shampoo Apply 1 application topically 3 (three) times a week. 08/23/17  Yes [provider]  midodrine (PROAMATINE) 10 MG tablet Take 10 mg by mouth 3 (three) times daily.   Yes [provider]  multivitamin (RENA-VIT) TABS tablet Take 1 tablet by mouth daily. 01/11/13  Yes Samella Parr, NP  nitroGLYCERIN (NITROSTAT) 0.4 MG SL tablet Place 0.4 mg under the tongue every 5 (five) minutes as needed for chest pain (max 3 doses - if no relief call MD).    Yes [provider]  OXYGEN 2lpm with sleep and "at home"   Yes [provider]  oxymetazoline (AFRIN) 0.05 % nasal spray Place 1 spray into both nostrils 2 (two) times daily as needed for congestion.   Yes [provider]  pantoprazole (PROTONIX) 40 MG tablet Take 1 tablet (40 mg total) by mouth 2 (two) times daily. Patient taking differently: Take 40 mg by mouth daily.  02/09/17  Yes Burnell Blanks, MD  polyethylene glycol (MIRALAX / GLYCOLAX) packet Take 17 g daily as needed by mouth for mild constipation. Mix in 8 oz liquid and  drink    Yes [provider]  STIOLTO RESPIMAT 2.5-2.5 MCG/ACT AERS INHALE 2 PUFFS BY MOUTH DAILY 07/06/17  Yes [provider]  triamcinolone cream (KENALOG) 0.1 % Apply 1 application topically 3 (three) times daily. 08/23/17  Yes [provider]  warfarin (COUMADIN) 7.5 MG tablet Take 0.5-1 tablets (3.75-7.5 mg total) See admin instructions by mouth. Patient taking differently: Take 3.75 mg by mouth daily at 6 PM.  07/19/17  Yes Rhyne, Samantha J, PA-C  oxyCODONE (ROXICODONE) 5 MG immediate release tablet Take 1 tablet (5 mg total) every 6 (six) hours as needed by mouth. Patient not taking: Reported on 09/01/2017 07/19/17   Gabriel Earing, PA-C   Liver Function Tests Recent Labs  Lab 09/01/17 1224  AST 26  ALT 18  ALKPHOS 131*  BILITOT 1.3*  PROT 7.8  ALBUMIN 3.1*   No results for input(s): LIPASE, AMYLASE in the last 168 hours. CBC Recent Labs  Lab 09/01/17 1224  WBC 7.3  NEUTROABS 6.0  HGB 11.6*  HCT 37.1*  MCV 110.4*  PLT 90*   Basic Metabolic Panel Recent Labs  Lab 09/01/17 1224  NA 136  K 4.2  CL 99*  CO2 25  GLUCOSE 120*  BUN 30*  CREATININE 5.06*  CALCIUM 8.5*   Iron/TIBC/Ferritin/ %Sat No results found for: IRON, TIBC, FERRITIN, IRONPCTSAT  Vitals:   09/02/17 1430 09/02/17 1500 09/02/17 1530 09/02/17 1600  BP: (!) 106/29 (!) 102/23 (!) 85/40 (!) 114/25  Pulse: (!) 123 (!) 120 (!) 112 (!) 121  Resp: 19 18 18 17   Temp:      TempSrc:      SpO2:      Weight:      Height:       Exam Gen alert, no distress, coughing No rash, cyanosis or gangrene Sclera anicteric, throat clear  +JVD to angle of jaw Chest bilat rales 1/3 up post, diffuse wheezing RRR no MRG Abd soft ntnd no mass or ascites +bs GU normal male MS no joint effusions or deformity Ext 1+ LE edema / no wounds or ulcers Neuro is alert, Ox 3 , nf R IJ TDC (LUA AVF recently ligated)    Dialysis: [facility] MWF 4h   77kg   Hep none   2K/3Ca bath    R IJ  TDC    Impression: 1. Dyspnea / multifactorial - due to underlying COPD with acute pulm edema on CXR. Needs HD and volume removal today.  May need a lower dry wt, not sure.  May need extra HD tomorrow.  2. ESRD - HD MWF.  3. COPD/ pulm HTN- on home O2 4. Aflutter / fib - per primary 5. Anemia of CKD - no esa needed, Hgb 11's 6. MBD - cont meds 7. CAD hx CABG/ DES 8. Nutrition - add multivit alb 2.9 9. COPD - on home O2 10. DNR    Plan - as above  Kelly Splinter MD [facility] pager [phone]   09/02/2017, 4:49 PM

## 2017-09-02 NOTE — Progress Notes (Signed)
Back from HD by bed awake and alert. °

## 2017-09-02 NOTE — Progress Notes (Signed)
[place] for Coumadin Indication: atrial fibrillation  Allergies  Allergen Reactions  . Adhesive [Tape] Rash    Please use paper tape  . Amoxicillin Rash    migraine  . Ativan [Lorazepam] Anxiety  . Bupropion Anxiety  . Diphenhydramine Itching and Anxiety    Only with IV doses. Tolerates oral.  . Penicillins Other (See Comments)    MIGRAINE  Has patient had a PCN reaction causing immediate rash, facial/tongue/throat swelling, SOB or lightheadedness with hypotension: no Has patient had a PCN reaction causing severe rash involving mucus membranes or skin necrosis: No Has patient had a PCN reaction that required hospitalization No Has patient had a PCN reaction occurring within the last 10 years: No If all of the above answers are "NO", then may proceed with Cephalosporin use.    Patient Measurements: Height: 5\' 10"  (177.8 cm) Weight: 171 lb 11.8 oz (77.9 kg) IBW/kg (Calculated) : 73 Heparin Dosing Weight: n/a  Vital Signs: Temp: 98.4 F (36.9 C) (12/28 1254) Temp Source: Oral (12/28 1254) BP: 103/48 (12/28 1303) Pulse Rate: 124 (12/28 1303)  Labs: Recent Labs    09/01/17 1224 09/01/17 1230 09/02/17 0255  HGB 11.6*  --   --   HCT 37.1*  --   --   PLT 90*  --   --   LABPROT 27.0*  --  32.3*  INR 2.52  --  3.17  CREATININE 5.06*  --   --   TROPONINI  --  0.07*  --     Estimated Creatinine Clearance: 15.6 mL/min (A) (by C-G formula based on SCr of 5.06 mg/dL (H)).   Assessment: 61 yo male with hx afib, on chronic Coumadin for afib.  PTA Coumadin dose 3.75 mg daily.  Last dose taken 12/26.  Dose confirmed with Coumadin clinic records.    INR is just above goal today likely due to Levaquin dose on 12/27 - will reduce dose tonight. No overt bleeding or complications noted. Hgb 11.6, platelets are low at 90.  Goal of Therapy:  INR 2-3 Monitor platelets by anticoagulation protocol: Yes   Plan:  1. Coumadin 2 mg PO  tonight 2. Daily INR 3. Monitor for s/sx of bleeding   Renold Genta, PharmD, BCPS Clinical Pharmacist Phone for today - [place] - [phone] 09/02/2017 1:49 PM

## 2017-09-02 NOTE — Progress Notes (Signed)
Transported to HD by bed.

## 2017-09-02 NOTE — Procedures (Signed)
   I was present at this dialysis session, have reviewed the session itself and made  appropriate changes Kelly Splinter MD [place] pager [phone]   09/02/2017, 2:41 PM

## 2017-09-02 NOTE — Progress Notes (Addendum)
   Subjective:  [name] reports that his breathing is improved today. He was transitioned from BiPAP to 10L HFNC. Continuing to receive breathing treatments. He denies chest pain, palpitations, fevers, or chills. Denies feeling dizzy or lightheaded. Patient reports that his BP have been doing better with dialysis recently, states that systolics have been in the 110s-120s.  Objective:  Vital signs in last 24 hours: Vitals:   09/01/17 2312 09/01/17 2354 09/02/17 0403 09/02/17 0410  BP:      Pulse:  (!) 110 (!) 108   Resp:  18 17   Temp: 98.2 F (36.8 C)   97.9 F (36.6 C)  TempSrc: Oral   Axillary  SpO2:  95% 96%   Weight:      Height:       GEN: Pleasant, elderly male lying in bed in NAD. Able to speak in full sentences. On 10L [place]. RESP: Diffuse inspiratory wheezes and coarse breath sounds. No accessory muscle use. CV: Normal rate and regular rhythm. 2/6 systolic murmur best heard at apex. No LE edema. ABD: Soft. Non-tender. Non-distended. Normoactive bowel sounds. EXT: No LE edema. Warm and well perfused. NEURO: Cranial nerves II-XII grossly intact. Able to lift all four extremities against gravity. No apparent audiovisual hallucinations. Speech fluent and appropriate. PSYCH: Patient is calm and pleasant. Appropriate affect. Well-groomed; speech is appropriate and on-subject.  Assessment/Plan:  Active Problems:   COPD exacerbation [facility])  Mr. Nohr is a 61 yo M with a history of Multiple Sclerosis, CAD (s/p CABGx2), A. Fib/flutter, Hypotension, OSA, COPD, CHF (EF 55-60%, 08/03/17), GERD, and thrombocytopenia who presented with acute on chronic respiratory failure 2/2 COPD exacerbation, likely triggered by metapneumovirus. Shortness of breath and O2 saturation now improved.  COPD Exacerbation, likely triggered by metapneumovirus RVP positive for metapneumovirus. Flu negative. BiPAP transitioned to 10L HFNC today, O2 sats stable. Shortness of breath improved today. BCx pending.  Patient did receive 1 dose of levofloxacin yesterday for possible HCAP coverage, but given clinical improvement and positive RVP, will discontinue levofloxacin. - Droplet precautions. - Duoneb q4h - Continue Solumedrol 60mg  q6h - discontinue levaquin - Wean O2 as able to O2 saturation goal of 88-92% - Telemetry  Hypotension Chronic and stable. Home regimen includes midodrine 10mg  TID. - Continue home Midodrine 10mg  TID  ESRD on HD MWF Last dialysis 08/31/17. - Nephrology consulted; appreciate their assistance - Will get dialysis today. - Phoslo TID AC  A. Fib In NSR. HR in the 110s-120s. CHADS2-VASc score is at least 2, with a 2.9% annual risk of CVA. On Warfarin, INR today is 3.17. - Warfarin per pharmacy - Continue home amiodarone 200mg  daily - Telemetry  Dispo: Anticipated discharge in approximately 1-2 day(s).   Colbert Ewing, MD 09/02/2017, 6:20 AM Pager: [place] Nick [phone]

## 2017-09-03 LAB — RENAL FUNCTION PANEL
ANION GAP: 11 (ref 5–15)
Albumin: 3.1 g/dL — ABNORMAL LOW (ref 3.5–5.0)
BUN: 32 mg/dL — AB (ref 6–20)
CHLORIDE: 95 mmol/L — AB (ref 101–111)
CO2: 27 mmol/L (ref 22–32)
Calcium: 7.9 mg/dL — ABNORMAL LOW (ref 8.9–10.3)
Creatinine, Ser: 4.78 mg/dL — ABNORMAL HIGH (ref 0.61–1.24)
GFR calc Af Amer: 14 mL/min — ABNORMAL LOW (ref >60)
GFR calc non Af Amer: 12 mL/min — ABNORMAL LOW (ref >60)
GLUCOSE: 115 mg/dL — AB (ref 65–99)
POTASSIUM: 4.5 mmol/L (ref 3.5–5.1)
Phosphorus: 5 mg/dL — ABNORMAL HIGH (ref 2.5–4.6)
Sodium: 133 mmol/L — ABNORMAL LOW (ref 135–145)

## 2017-09-03 LAB — CBC
HEMATOCRIT: 36.1 % — AB (ref 39.0–52.0)
Hemoglobin: 10.8 g/dL — ABNORMAL LOW (ref 13.0–17.0)
MCH: 32.9 pg (ref 26.0–34.0)
MCHC: 29.9 g/dL — AB (ref 30.0–36.0)
MCV: 110.1 fL — AB (ref 78.0–100.0)
Platelets: 109 10*3/uL — ABNORMAL LOW (ref 150–400)
RBC: 3.28 MIL/uL — ABNORMAL LOW (ref 4.22–5.81)
RDW: 18.5 % — AB (ref 11.5–15.5)
WBC: 12.2 10*3/uL — ABNORMAL HIGH (ref 4.0–10.5)

## 2017-09-03 LAB — PROTIME-INR
INR: 3.8
PROTHROMBIN TIME: 37.2 s — AB (ref 11.4–15.2)

## 2017-09-03 MED ORDER — PENTAFLUOROPROP-TETRAFLUOROETH EX AERO: 1.0000 "application " | INHALATION_SPRAY | CUTANEOUS | Status: DC | PRN

## 2017-09-03 MED ORDER — ALTEPLASE 2 MG IJ SOLR: 2.0000 mg | Freq: Once | INTRAMUSCULAR | Status: DC | PRN

## 2017-09-03 MED ORDER — SODIUM CHLORIDE 0.9 % IV SOLN: 100.0000 mL | INTRAVENOUS | Status: DC | PRN

## 2017-09-03 MED ORDER — LIDOCAINE HCL (PF) 1 % IJ SOLN: 5.0000 mL | INTRAMUSCULAR | Status: DC | PRN

## 2017-09-03 MED ORDER — PENTAFLUOROPROP-TETRAFLUOROETH EX AERO
1.0000 "application " | INHALATION_SPRAY | CUTANEOUS | Status: DC | PRN
  Filled 2017-09-03: qty 30

## 2017-09-03 MED ORDER — HEPARIN SODIUM (PORCINE) 1000 UNIT/ML DIALYSIS: 1000.0000 [IU] | INTRAMUSCULAR | Status: DC | PRN

## 2017-09-03 MED ORDER — LIDOCAINE-PRILOCAINE 2.5-2.5 % EX CREA
1.0000 | TOPICAL_CREAM | CUTANEOUS | Status: DC | PRN
Start: 2017-09-03 — End: 2017-09-08
  Filled 2017-09-03: qty 5

## 2017-09-03 MED ORDER — DIPHENHYDRAMINE HCL 25 MG PO CAPS
25.0000 mg | ORAL_CAPSULE | Freq: Once | ORAL | Status: AC
  Administered 2017-09-03: 25 mg via ORAL
  Filled 2017-09-03: qty 1

## 2017-09-03 MED ORDER — PREDNISONE 20 MG PO TABS
40.0000 mg | ORAL_TABLET | Freq: Two times a day (BID) | ORAL | Status: DC
  Administered 2017-09-03: 40 mg via ORAL
  Filled 2017-09-03: qty 2

## 2017-09-03 MED ORDER — ALTEPLASE 2 MG IJ SOLR
2.0000 mg | Freq: Once | INTRAMUSCULAR | Status: DC | PRN
Start: 2017-09-03 — End: 2017-09-08

## 2017-09-03 MED ORDER — MIDODRINE HCL 5 MG PO TABS
ORAL_TABLET | ORAL | Status: AC
  Administered 2017-09-03: 10 mg via ORAL
  Filled 2017-09-03: qty 2

## 2017-09-03 MED ORDER — LIDOCAINE-PRILOCAINE 2.5-2.5 % EX CREA: 1.0000 "application " | TOPICAL_CREAM | CUTANEOUS | Status: DC | PRN

## 2017-09-03 MED ORDER — HEPARIN SODIUM (PORCINE) 1000 UNIT/ML DIALYSIS
1000.0000 [IU] | INTRAMUSCULAR | Status: DC | PRN
  Administered 2017-09-05: 1000 [IU] via INTRAVENOUS_CENTRAL
  Filled 2017-09-03: qty 1

## 2017-09-03 NOTE — Progress Notes (Signed)
Received patient from [place], alert and oriented on 7Lnc, not in distress, wife at bedside.

## 2017-09-03 NOTE — Progress Notes (Signed)
Pt returned from HD 13:00 on 7L n/c C/o feeling he can not breathe. Early breathing tx given. Sats currently @ 92% with pt sitting upright. Rn will try to lower O2 from 7L when possible.

## 2017-09-03 NOTE — Progress Notes (Signed)
[place] for Warfarin Indication: atrial fibrillation  Allergies  Allergen Reactions  . Adhesive [Tape] Rash    Please use paper tape  . Amoxicillin Rash    migraine  . Ativan [Lorazepam] Anxiety  . Bupropion Anxiety  . Diphenhydramine Itching and Anxiety    Only with IV doses. Tolerates oral.  . Penicillins Other (See Comments)    MIGRAINE  Has patient had a PCN reaction causing immediate rash, facial/tongue/throat swelling, SOB or lightheadedness with hypotension: no Has patient had a PCN reaction causing severe rash involving mucus membranes or skin necrosis: No Has patient had a PCN reaction that required hospitalization No Has patient had a PCN reaction occurring within the last 10 years: No If all of the above answers are "NO", then may proceed with Cephalosporin use.    Patient Measurements: Height: 5\' 10"  (177.8 cm) Weight: 166 lb 3.6 oz (75.4 kg) IBW/kg (Calculated) : 73 Heparin Dosing Weight: n/a  Vital Signs: Temp: 97.4 F (36.3 C) (12/29 0825) Temp Source: Oral (12/29 0825) BP: 98/71 (12/29 1000) Pulse Rate: 109 (12/29 1000)  Labs: Recent Labs    09/01/17 1224 09/01/17 1230 09/02/17 0255 09/03/17 0242 09/03/17 0917 09/03/17 0918  HGB 11.6*  --   --   --  10.8*  --   HCT 37.1*  --   --   --  36.1*  --   PLT 90*  --   --   --  109*  --   LABPROT 27.0*  --  32.3* 37.2*  --   --   INR 2.52  --  3.17 3.80  --   --   CREATININE 5.06*  --   --   --   --  4.78*  TROPONINI  --  0.07*  --   --   --   --     Estimated Creatinine Clearance: 16.5 mL/min (A) (by C-G formula based on SCr of 4.78 mg/dL (H)).   Assessment: 9 yoM with hx Afib on warfarin PTA. INR 3.80 today, Hgb down slightly, no S/Sx bleeding noted per RN.  Home warfarin dose = 3.75mg  daily  Goal of Therapy:  INR 2-3 Monitor platelets by anticoagulation protocol: Yes   Plan:  -Hold warfarin tonight -Daily INR -Monitor S/Sx bleeding  closely  Arrie Senate, PharmD, BCPS PGY-2 Cardiology Pharmacy Resident Pager: [phone] 09/03/2017

## 2017-09-03 NOTE — Progress Notes (Signed)
Medicine attending: Clinical status and database reviewed with resident physician Dr. Colbert Ewing and I concur with her evaluation and management plan.

## 2017-09-03 NOTE — Progress Notes (Addendum)
[facility] Progress Note  Subjective: breathing a little better.  Stood to weigh at 75.4kg.  Had HD last night with -3L and stable BP's.    Vitals:   09/03/17 0830 09/03/17 0900 09/03/17 0930 09/03/17 1000  BP: 96/74 99/60 94/72  98/71  Pulse: (!) 108 (!) 108 (!) 104 (!) 109  Resp: 18 19 18    Temp:      TempSrc:      SpO2:  96% 98% 99%  Weight:      Height:        Inpatient medications: . amiodarone  200 mg Oral Daily  . atorvastatin  20 mg Oral QHS  . calcium acetate  4,002 mg Oral TID WC  . guaiFENesin  600 mg Oral BID  . ipratropium-albuterol  3 mL Nebulization Q6H  . mouth rinse  15 mL Mouth Rinse BID  . midodrine  10 mg Oral TID  . multivitamin  1 tablet Oral QHS  . predniSONE  40 mg Oral BID WC  . Warfarin - Pharmacist Dosing Inpatient   Does not apply q1800   . sodium chloride    . sodium chloride    . sodium chloride    . sodium chloride    . sodium chloride     sodium chloride, sodium chloride, sodium chloride, sodium chloride, sodium chloride, acetaminophen, alteplase, heparin, lidocaine (PF), lidocaine-prilocaine, pentafluoroprop-tetrafluoroeth, senna-docusate  Exam: Gen alert, no distress Chest mild basilar rales, mild exp wheezing RRR no MRG Abd soft ntnd no mass GU normal male MS no joint effusions or deformity Ext 1+ LE edema Neuro is alert, Ox 3 , nf R IJ TDC (LUA AVF recently ligated)    Dialysis: [facility] MWF 4h   77kg   Hep none   2K/3Ca bath    R IJ TDC    Impression: 1. Dyspnea / multifactorial - due to underlying COPD with acute pulm edema on CXR. 3L off yest, another 3 L off this am. Is now down 5-6kg below prior dry wt after HD this am.  2. ESRD - HD MWF.  OK for dc from renal standpoint, but he must be dc'd either today or on Monday, not on Sunday.  If dc'd on Sunday he will miss his first HD of this week due to holiday schedule at OP centers. Have d/w primary team.  3. COPD/ pulm HTN- on home O2 4. Aflutter / fib -  per primary, HR runs high typically 5. Anemia of CKD - no esa needed, Hgb 11's 6. MBD - cont meds 7. CAD hx CABG/ DES 8. Nutrition - add multivit alb 2.9 9. COPD - on home O2 10. DNR    Plan - as above   Kelly Splinter MD [facility] pager [phone]   09/03/2017, 10:40 AM   Recent Labs  Lab 09/01/17 1224 09/03/17 0918  NA 136 133*  K 4.2 4.5  CL 99* 95*  CO2 25 27  GLUCOSE 120* 115*  BUN 30* 32*  CREATININE 5.06* 4.78*  CALCIUM 8.5* 7.9*  PHOS  --  5.0*   Recent Labs  Lab 09/01/17 1224 09/03/17 0918  AST 26  --   ALT 18  --   ALKPHOS 131*  --   BILITOT 1.3*  --   PROT 7.8  --   ALBUMIN 3.1* 3.1*   Recent Labs  Lab 09/01/17 1224 09/03/17 0917  WBC 7.3 12.2*  NEUTROABS 6.0  --   HGB 11.6* 10.8*  HCT 37.1* 36.1*  MCV  110.4* 110.1*  PLT 90* 109*   Iron/TIBC/Ferritin/ %Sat No results found for: IRON, TIBC, FERRITIN, IRONPCTSAT

## 2017-09-03 NOTE — Progress Notes (Signed)
   Subjective:  Mr. Nabor reports that his breathing is improved today. He does still feel that he has a lot of phlegm that he needs to cough up. Currently on 8L HFNC with O2 sats ~92-96%. Denies chest pain, palpitations, fevers, or chills. Denies feeling dizzy or lightheaded. Had dialysis yesterday, which he states went well.  Objective:  Vital signs in last 24 hours: Vitals:   09/03/17 0007 09/03/17 0048 09/03/17 0300 09/03/17 0502  BP: 92/70   95/71  Pulse: (!) 109  (!) 110 (!) 111  Resp: 19 (!) 23 16 20   Temp: 98.2 F (36.8 C)   97.6 F (36.4 C)  TempSrc: Oral   Oral  SpO2:  97% 96% 95%  Weight:      Height:       GEN: Pleasant, elderly male lying in bed in NAD. Able to speak in full sentences. On 8L [place]. RESP: Diffuse expiratory wheezes and coarse breath sounds. No accessory muscle use. CV: Tachycardic, regular rhythm. 2/6 systolic murmur heard best at apex. No LE edema. ABD: Soft. Non-tender. Non-distended. Normoactive bowel sounds. EXT: No LE edema. Warm and well perfused. NEURO: Cranial nerves II-XII grossly intact. Able to lift all four extremities against gravity. No apparent audiovisual hallucinations. Speech fluent and appropriate. PSYCH: Patient is calm and pleasant. Appropriate affect. Well-groomed; speech is appropriate and on-subject.  Assessment/Plan:  Principal Problem:   Acute on chronic respiratory failure with hypoxia and hypercapnia (HCC) Active Problems:   Chronic diastolic congestive heart failure (HCC)   COPD exacerbation (HCC)   Human metapneumovirus (hMPV) pneumonia  Mr. Slabach is a 61 yo M with a history of Multiple Sclerosis, CAD (s/p CABGx2), A. Fib/flutter, Hypotension, OSA, COPD, CHF (EF 55-60%, 08/03/17), GERD, and thrombocytopenia who presented with acute on chronic respiratory failure 2/2 COPD exacerbation, likely triggered by metapneumovirus. Shortness of breath and O2 saturation improving with steroids, duonebs, and oxygen therapy.  COPD  Exacerbation, likely triggered by metapneumovirus RVP positive for metapneumovirus. Flu negative. Now on 8L HFNC with O2 sats 92-96%. Also with pulmonary interstitial edema on CXR on admission, getting HD per Nephrology Shortness of breath continues to improve. BCx pending. Patient did receive 1 dose of levofloxacin on admission for possible HCAP coverage, but given positive RVP, levofloxacin was discontinued. WBC slightly elevated in setting of receiving IV steroids. - Transfer to telemetry - Duoneb q6h - Transition solumedrol to prednisone 40mg  BID - Wean O2 as able to O2 saturation goal of 88-92% - HD 12/28 & 12/29  Hypotension Chronic and stable. Home regimen includes midodrine 10mg  TID. - Continue home Midodrine 10mg  TID  ESRD on HD MWF Last dialysis 09/02/2017. Tolerated yesterday's session well. Reports that his shortness of breath was somewhat improved after dialysis. - Nephrology consulted; appreciate their assistance - Will get another HD session today, per nephrology - Phoslo TID AC  A. Fib In sinus rhythm. HR in the 100s-110s, likely exacerbated by duonebs. CHADS2-VASc score is at least 2, with a 2.9% annual risk of CVA. On Warfarin, INR today is 3.8 - Warfarin per pharmacy - Continue home amiodarone 200mg  daily - Telemetry  Dispo: Anticipated discharge in approximately 1-2 day(s).  Colbert Ewing, MD 09/03/2017, 7:29 AM Pager: Mamie Nick [phone]

## 2017-09-04 ENCOUNTER — Inpatient Hospital Stay (HOSPITAL_COMMUNITY): Payer: Medicare Other

## 2017-09-04 DIAGNOSIS — Z79899 Other long term (current) drug therapy: Secondary | ICD-10-CM

## 2017-09-04 DIAGNOSIS — I959 Hypotension, unspecified: Secondary | ICD-10-CM

## 2017-09-04 DIAGNOSIS — I4891 Unspecified atrial fibrillation: Secondary | ICD-10-CM

## 2017-09-04 DIAGNOSIS — D696 Thrombocytopenia, unspecified: Secondary | ICD-10-CM

## 2017-09-04 DIAGNOSIS — K219 Gastro-esophageal reflux disease without esophagitis: Secondary | ICD-10-CM

## 2017-09-04 DIAGNOSIS — Z951 Presence of aortocoronary bypass graft: Secondary | ICD-10-CM

## 2017-09-04 DIAGNOSIS — N186 End stage renal disease: Secondary | ICD-10-CM

## 2017-09-04 DIAGNOSIS — G35 Multiple sclerosis: Secondary | ICD-10-CM

## 2017-09-04 DIAGNOSIS — J123 Human metapneumovirus pneumonia: Principal | ICD-10-CM

## 2017-09-04 DIAGNOSIS — G4733 Obstructive sleep apnea (adult) (pediatric): Secondary | ICD-10-CM

## 2017-09-04 DIAGNOSIS — Z9981 Dependence on supplemental oxygen: Secondary | ICD-10-CM

## 2017-09-04 DIAGNOSIS — J441 Chronic obstructive pulmonary disease with (acute) exacerbation: Secondary | ICD-10-CM

## 2017-09-04 DIAGNOSIS — I2510 Atherosclerotic heart disease of native coronary artery without angina pectoris: Secondary | ICD-10-CM

## 2017-09-04 DIAGNOSIS — I5032 Chronic diastolic (congestive) heart failure: Secondary | ICD-10-CM

## 2017-09-04 DIAGNOSIS — Z992 Dependence on renal dialysis: Secondary | ICD-10-CM

## 2017-09-04 DIAGNOSIS — Z7901 Long term (current) use of anticoagulants: Secondary | ICD-10-CM

## 2017-09-04 LAB — PROTIME-INR
INR: 2.32
PROTHROMBIN TIME: 25.3 s — AB (ref 11.4–15.2)

## 2017-09-04 MED ORDER — BIOTENE DRY MOUTH MT LIQD: 15.0000 mL | OROMUCOSAL | Status: DC | PRN

## 2017-09-04 MED ORDER — WARFARIN 1.25 MG HALF TABLET
3.7500 mg | ORAL_TABLET | Freq: Once | ORAL | Status: AC
  Administered 2017-09-04: 18:00:00 3.75 mg via ORAL
  Filled 2017-09-04 (×2): qty 1

## 2017-09-04 MED ORDER — IPRATROPIUM-ALBUTEROL 0.5-2.5 (3) MG/3ML IN SOLN
3.0000 mL | RESPIRATORY_TRACT | Status: DC
  Administered 2017-09-04 – 2017-09-08 (×20): 3 mL via RESPIRATORY_TRACT
  Filled 2017-09-04 (×5): qty 3
  Filled 2017-09-04: qty 6
  Filled 2017-09-04 (×13): qty 3

## 2017-09-04 MED ORDER — LEVOFLOXACIN IN D5W 750 MG/150ML IV SOLN
750.0000 mg | Freq: Once | INTRAVENOUS | Status: AC
  Administered 2017-09-04: 750 mg via INTRAVENOUS
  Filled 2017-09-04: qty 150

## 2017-09-04 MED ORDER — LEVOFLOXACIN IN D5W 500 MG/100ML IV SOLN
500.0000 mg | INTRAVENOUS | Status: DC
  Administered 2017-09-05 – 2017-09-07 (×2): 500 mg via INTRAVENOUS
  Filled 2017-09-04 (×2): qty 100

## 2017-09-04 MED ORDER — METHYLPREDNISOLONE SODIUM SUCC 125 MG IJ SOLR
60.0000 mg | Freq: Four times a day (QID) | INTRAMUSCULAR | Status: DC
  Administered 2017-09-04 – 2017-09-05 (×2): 60 mg via INTRAVENOUS
  Filled 2017-09-04 (×3): qty 2

## 2017-09-04 MED ORDER — METHYLPREDNISOLONE SODIUM SUCC 125 MG IJ SOLR
80.0000 mg | Freq: Two times a day (BID) | INTRAMUSCULAR | Status: DC
  Administered 2017-09-04: 80 mg via INTRAVENOUS
  Filled 2017-09-04: qty 2

## 2017-09-04 NOTE — Progress Notes (Addendum)
Pt's oxygen saturation 83% @7l /min, and complaining distress. Breathing treatment provided. Now oxygen is in high flow rate and saturation is 96%, MD called and RRT called, waiting for the response  They are in bed side, stat ABG and CXR ordered

## 2017-09-04 NOTE — Progress Notes (Signed)
Medicine attending: I examined this patient today together with resident physician Dr. Colbert Ewing and I concur with her evaluation and management plan which we discussed together.  61 year old male with end-stage renal disease on dialysis, coronary artery disease, diastolic cardiac dysfunction, and advanced obstructive airway disease, oxygen dependent admitted on December 27 with increasing dyspnea, cough productive of sputum, with a recent November 26 admission to evaluate hypotension and progressive right-sided heart failure. At time of current admission he was hypoxic, hypercarbic, was treated with BiPAP, bronchodilators, and parenteral steroids.  Respiratory panel positive for Meta pneumo virus. Overall condition stabilized until this morning when he again developed increasing respiratory distress. On current exam, he is awake and oriented, lungs are hyperresonant to percussion, diffuse expiratory wheezing and prolonged expiratory phase.  No cyanosis.  Not using accessory muscles to breathe. Blood gas shows persistent hypoxia with PO2 82 on 10 L of oxygen, pH 7.2, PCO2 72 Little change from admission values. Chest x-ray with cardiomegaly and patchy stable interstitial pulmonary changes bilaterally stable compared with admission film. Impression: Advanced hypoxic and hypercarbic respiratory failure despite maximum medical therapy. Concern is that the work of breathing will soon overwhelm him to the point where mechanical ventilation will need to be considered.  Goals of care discussion with him and his family appropriate at this time.  There is a DO NOT RESUSCITATE status in the chart record which we need to confirm.  In the interim, we will transfer him back to a higher acuity care ward and continue full dose steroids, bronchodilators, and oxygen. Prognosis is guarded.

## 2017-09-04 NOTE — Progress Notes (Addendum)
Subjective:  Rapid response and RT called for worsened shortness of breath this morning. Mr. Bradsher was tripod-ing and wheezy on exam, even after breathing treatment. He was on 4L HFNC with O2 sats in the 80s, which improved to ~95% with increase to 12L HFNC. When we entered the room, he appeared to be breathing more comfortably although worse than yesterday. Able to speak in full sentences but had to pause every so often. Appears more somnolent but easily arousable and answers questions appropriately. He states that he still feels like he cannot catch his breath. Denies chest pain, felt palpitations earlier although is not sure when. Denies fevers or chills or worsened cough.   Objective:  Vital signs in last 24 hours: Vitals:   09/04/17 0436 09/04/17 0752 09/04/17 0800 09/04/17 0814  BP: 106/75   99/65  Pulse: (!) 115   (!) 111  Resp: (!) 22  20 20   Temp: 99.2 F (37.3 C)   98 F (36.7 C)  TempSrc: Oral   Oral  SpO2: 95% (!) 85% 94% 98%  Weight: 156 lb 8.4 oz (71 kg)     Height:       GEN: Pleasant, elderly male lying in bed in mild distress. Able to speak in full sentences but requiring pauses every so often. On 10L HFNC. Appears more somnolent, although easily arousable and follows commands appropriately. RESP: Diffuse expiratory wheezes and coarse breath sounds. Increased accessory muscle use. CV: Tachycardic, regular rhythm. 2/6 systolic murmur heard best at apex. No LE edema. ABD: Soft. Non-tender. Non-distended. Normoactive bowel sounds. EXT: No LE edema. Warm and well perfused. NEURO: Cranial nerves II-XII grossly intact. Able to lift all four extremities against gravity. No apparent audiovisual hallucinations. Speech fluent and appropriate. PSYCH: Patient is calm and pleasant. Appropriate affect. Well-groomed; speech is appropriate and on-subject.  Labs CBC Latest Ref Rng & Units 09/03/2017 09/01/2017 08/12/2017  WBC 4.0 - 10.5 K/uL 12.2(H) 7.3 7.9  Hemoglobin 13.0 - 17.0  g/dL 10.8(L) 11.6(L) 11.6(L)  Hematocrit 39.0 - 52.0 % 36.1(L) 37.1(L) 36.2(L)  Platelets 150 - 400 K/uL 109(L) 90(L) 104(L)   CMP Latest Ref Rng & Units 09/03/2017 09/01/2017 08/12/2017  Glucose 65 - 99 mg/dL 115(H) 120(H) 107(H)  BUN 6 - 20 mg/dL 32(H) 30(H) 44(H)  Creatinine 0.61 - 1.24 mg/dL 4.78(H) 5.06(H) 5.96(H)  Sodium 135 - 145 mmol/L 133(L) 136 137  Potassium 3.5 - 5.1 mmol/L 4.5 4.2 4.0  Chloride 101 - 111 mmol/L 95(L) 99(L) 100(L)  CO2 22 - 32 mmol/L 27 25 24   Calcium 8.9 - 10.3 mg/dL 7.9(L) 8.5(L) 6.8(L)  Total Protein 6.5 - 8.1 g/dL - 7.8 -  Total Bilirubin 0.3 - 1.2 mg/dL - 1.3(H) -  Alkaline Phos 38 - 126 U/L - 131(H) -  AST 15 - 41 U/L - 26 -  ALT 17 - 63 U/L - 18 -   ABG 7.209/71.9/81.8 INR 2.32 BCx NGTD x2d  Assessment/Plan:  Principal Problem:   Acute on chronic respiratory failure with hypoxia and hypercapnia (HCC) Active Problems:   Chronic diastolic congestive heart failure (HCC)   COPD exacerbation (HCC)   Human metapneumovirus (hMPV) pneumonia  Mr. Capshaw is a 61 yo M with a history of Multiple Sclerosis, CAD (s/p CABGx2), A. Fib/flutter, Hypotension, OSA, COPD, CHF (EF 55-60%, 08/03/17), GERD, and thrombocytopenia who presented with acute on chronic respiratory failure 2/2 COPD exacerbation, likely triggered by metapneumovirus. Shortness of breath and O2 saturation had been improving, but acutely worsened this morning.  COPD  Exacerbation, likely triggered by metapneumovirus Patient had been improving until this morning when he acutely became more short of breath. Was on 4L HFNC with sats in 80s, rapid response and RT called and increased his oxygen. Now on 10L HFNC with O2 sats >95%. CXR showed persistent interstitial edema and cardiomegaly. Stat ABG showed CO2 of 71.9, patient does appear more somnolent. Patient is DNR/DNI. Ordered BiPAP PRN, transition back to IV steroids, and transfer to step down for closer monitoring. BCx NGTD. He did get a dose of IV  Levaquin initially on admission for COPD exacerbation but with his positive RVP, we elected to discontinue antibiotics and continue supportive treatment. Given his acute worsening this morning, we will restart IV levaquin for moderate-severe COPD exacerbation. - Transfer to stepdown - Duoneb q4h - IV solumedrol 60mg  q6h - BiPAP PRN - Wean O2 as able to O2 saturation goal of 88-92% - IV Levaquin 750mg  x1 dose, then 500mg  q48h  Hypotension Chronic and stable. Home regimen includes midodrine 10mg  TID - Continue home Midodrine 10mg  TID  ESRD on HD MWF HD 12/28 and 12/29. - Nephrology consulted; appreciate their assistance - Phoslo TID AC  A. Fib In sinus rhythm. HR in the 100s-110s, likely exacerbated by duonebs. CHADS2-VASc score is at least 2, with a 2.9% annual risk of CVA. On Warfarin, INR today is 2.3 - Warfarin per pharmacy - Continue home amiodarone 200mg  daily - Telemetry  Dispo: Anticipated discharge in approximately 2-3 day(s).  Colbert Ewing, MD 09/04/2017, 9:52 AM Pager: Mamie Nick [phone]

## 2017-09-04 NOTE — Clinical Social Work Note (Signed)
CSW acknowledges consult, "Wife would like a copy of the newly formed DNR paperwork so that she has it for the next time she requests an ambulance." DNR filled out and placed on chart for MD signature.  CSW signing off. Consult again if any other social work needs arise.  Dayton Scrape, [place]

## 2017-09-04 NOTE — Progress Notes (Signed)
MD paged regarding ABG result, stat BIPAP order there, RRT and rapid nurse aware, waiting for the stepdown bed to transfer the patient. Pt's wife called and making her aware about the situation, pt talked with her too from his bed this morning, she said she is on the way to hospital

## 2017-09-04 NOTE — Progress Notes (Signed)
ANTICOAGULATION CONSULT NOTE - Follow Up Consult  Pharmacy Consult for Warfarin Indication: atrial fibrillation  Allergies  Allergen Reactions  . Adhesive [Tape] Rash    Please use paper tape  . Amoxicillin Rash    migraine  . Ativan [Lorazepam] Anxiety  . Bupropion Anxiety  . Diphenhydramine Itching and Anxiety    Only with IV doses. Tolerates oral.  . Penicillins Other (See Comments)    MIGRAINE  Has patient had a PCN reaction causing immediate rash, facial/tongue/throat swelling, SOB or lightheadedness with hypotension: no Has patient had a PCN reaction causing severe rash involving mucus membranes or skin necrosis: No Has patient had a PCN reaction that required hospitalization No Has patient had a PCN reaction occurring within the last 10 years: No If all of the above answers are "NO", then may proceed with Cephalosporin use.    Patient Measurements: Height: 5\' 10"  (177.8 cm) Weight: 156 lb 8.4 oz (71 kg)(due to elevated heart rate bed weight ) IBW/kg (Calculated) : 73 Heparin Dosing Weight: n/a  Vital Signs: Temp: 98 F (36.7 C) (12/30 0814) Temp Source: Oral (12/30 0814) BP: 95/64 (12/30 0842) Pulse Rate: 110 (12/30 1126)  Labs: Recent Labs    09/01/17 1224 09/01/17 1230 09/02/17 0255 09/03/17 0242 09/03/17 0917 09/03/17 0918 09/04/17 0450  HGB 11.6*  --   --   --  10.8*  --   --   HCT 37.1*  --   --   --  36.1*  --   --   PLT 90*  --   --   --  109*  --   --   LABPROT 27.0*  --  32.3* 37.2*  --   --  25.3*  INR 2.52  --  3.17 3.80  --   --  2.32  CREATININE 5.06*  --   --   --   --  4.78*  --   TROPONINI  --  0.07*  --   --   --   --   --     Estimated Creatinine Clearance: 16.1 mL/min (A) (by C-G formula based on SCr of 4.78 mg/dL (H)).   Assessment: 61yom continues on warfarin for afib. INR jumped to 3.8 yesterday and dose held. INR back within goal today at 2.32. Levaquin restarted for COPD exacerbation so will need to watch for drug  interaction.  Home warfarin dose = 3.75mg  daily  Goal of Therapy:  INR 2-3 Monitor platelets by anticoagulation protocol: Yes   Plan:  1) Warfarin 3.75mg  tonight 2) Daily INR  Nena Jordan, PharmD, BCPS 09/04/2017

## 2017-09-04 NOTE — Significant Event (Signed)
Rapid Response Event Note  Overview: Called for resp distress Time Called: 0810 Arrival Time: 0820 Event Type: Respiratory  Initial Focused Assessment:  On arrival patient supine in bed - arouses to name - oriented and follows commands - quickly falls to sleep - warm and dry - does c/o increased SOB - RR with moderate distress - using some accessory muscles - bil BS presesnt with coarse Rhonchi and exp wheezing - weak cough.  Otherwise vital signs unchanged.  O2 sats 99% on 12 liters HF nasal cannula.  Patient states he is on 2 liters and home with baseline O2 sats 88-90%.   Interventions:  Weaning O2 - decreased to 10L/HF.  Dr. Ronalee Red in room - changes noted .Marland Kitchen ABG and PCXR ordered per MD staff.  Solumedrol per MD order by RN Palma Holter.  ABG noted - orders for Bipap.  Started on BIpap - tol well.  Monitored on Bipap until transfer with RT.  Weaned to 35% O2 with O2 sats remaining 90-92%.  Patient resting - arouses to name.  Family in - alert and interactive with them.  No distress.  Oral care x 2. Handoff to [employer].  Questions answered.  Family updated.  MD's present for recheck.  Plan of Care (if not transferred):  Event Summary: Name of Physician Notified: Dr. Ronalee Red at (pta RRT)    at    Outcome: Transferred (Comment)(for progressive care with bipap)     Quin Hoop

## 2017-09-04 NOTE — Progress Notes (Signed)
BIPAP connected, RT in room. Pt is resting quietly, oxygen saturation is 96%. IVABX just started. Waiting for the bed for higher level of care

## 2017-09-04 NOTE — Progress Notes (Signed)
IMTS INTERVAL PROGRESS NOTE 09/04/2017, 12:48pm  Went to check on patient at bedside after transfer to [facility]. He is now on BiPAP with saturations remaining ~92%. He was resting comfortably and reported that his breathing was improved. Patient's wife and daughter were also at bedside. We had a lengthy discussion with his family regarding the patient's wishes for end of life care. We discussed his hospital course thus far and that his symptoms are likely related to an exacerbation of his COPD, which is worsened by his volume overload 2/2 ESRD and heart failure. His wife expressed understanding and confirmed that he is DNR/DNI and would not want to be intubated should his respiratory status decline. She expressed concern that he was feeling short of breath and anxious, although his vital signs were remaining stable. We discussed getting Palliative Medicine on board to assist with symptom management. Wife and daughter agreed. I discussed the plan moving forward for his hospital stay, which includes management of COPD exacerbation, oxygenation with BiPAP as needed, and dialysis as scheduled. I have signed the DNR paperwork for the family to take home and consulted Palliative Medicine who stated that they will be able to see him tomorrow. We will continue to monitor him closely.  Colbert Ewing, MD Internal Medicine, PGY-1

## 2017-09-04 NOTE — Progress Notes (Addendum)
Pt is being transferred to 2c17 for higher level of care, report provided to the receiving nurse. Pt is being transferred with BIPAP, with RT and rapid response nurse. Pt is still alert and oriented while in transfer. Pt left the unit with all of his belongings. Family members aware

## 2017-09-04 NOTE — Progress Notes (Signed)
[facility] Progress Note  Subjective: breathing rough today, [name]  looks worse.  Resp PCR + for metapneumovirus.  Got another 2.5- 3 L off with HD yest.   Vitals:   09/04/17 1000 09/04/17 1036 09/04/17 1126 09/04/17 1230  BP:   95/64   Pulse: (!) 120 (!) 110 (!) 110   Resp:  17 (!) 25   Temp:      TempSrc:      SpO2: 96% 99% 94% 95%  Weight:      Height:        Inpatient medications: . amiodarone  200 mg Oral Daily  . atorvastatin  20 mg Oral QHS  . calcium acetate  4,002 mg Oral TID WC  . guaiFENesin  600 mg Oral BID  . ipratropium-albuterol  3 mL Nebulization Q4H  . mouth rinse  15 mL Mouth Rinse BID  . methylPREDNISolone (SOLU-MEDROL) injection  60 mg Intravenous Q6H  . midodrine  10 mg Oral TID  . multivitamin  1 tablet Oral QHS  . warfarin  3.75 mg Oral ONCE-1800  . Warfarin - Pharmacist Dosing Inpatient   Does not apply q1800   . sodium chloride    . sodium chloride    . sodium chloride    . sodium chloride    . sodium chloride    . [START ON 09/06/2017] levofloxacin (LEVAQUIN) IV     sodium chloride, sodium chloride, sodium chloride, sodium chloride, sodium chloride, acetaminophen, alteplase, heparin, lidocaine (PF), lidocaine-prilocaine, pentafluoroprop-tetrafluoroeth, senna-docusate  Exam: Gen tired appearing, ^WOB Chest mild exp wheezing, mild basilar crackles RRR no MRG Abd soft ntnd no mass GU normal male MS no joint effusions or deformity Ext 1+ LE edema Neuro is alert, Ox 3 , nf R IJ TDC (LUA AVF recently ligated)    Dialysis: [facility] MWF 4h   77kg   Hep none   2K/3Ca bath    R IJ TDC    Impression: 1. Dyspnea / multifactorial - breathing worse despite 5-6 kg off on HD last 2 days, now 6kg under his prior dry wt.  Has +PCR for metapneumovirus, CXR w/ IS pattern worse today and may have viral PNA.  Prognosis poor and primary team is discussing EOL issues, DNR, etc., w/ pt and family.  2. ESRD - HD MWF. No indication HD today,  will reassess in am.   3. COPD/ pulm HTN- on home O2 4. Aflutter / fib - per primary, HR runs high typically 5. Anemia of CKD - no esa needed, Hgb 11's 6. MBD - cont meds 7. CAD hx CABG/ DES 8. Nutrition - add multivit alb 2.9 9. COPD - on home O2 10. DNR    Plan - as above   Kelly Splinter MD [facility] pager [phone]   09/04/2017, 1:17 PM   Recent Labs  Lab 09/01/17 1224 09/03/17 0918  NA 136 133*  K 4.2 4.5  CL 99* 95*  CO2 25 27  GLUCOSE 120* 115*  BUN 30* 32*  CREATININE 5.06* 4.78*  CALCIUM 8.5* 7.9*  PHOS  --  5.0*   Recent Labs  Lab 09/01/17 1224 09/03/17 0918  AST 26  --   ALT 18  --   ALKPHOS 131*  --   BILITOT 1.3*  --   PROT 7.8  --   ALBUMIN 3.1* 3.1*   Recent Labs  Lab 09/01/17 1224 09/03/17 0917  WBC 7.3 12.2*  NEUTROABS 6.0  --   HGB 11.6* 10.8*  HCT  37.1* 36.1*  MCV 110.4* 110.1*  PLT 90* 109*   Iron/TIBC/Ferritin/ %Sat No results found for: IRON, TIBC, FERRITIN, IRONPCTSAT

## 2017-09-05 DIAGNOSIS — Z992 Dependence on renal dialysis: Secondary | ICD-10-CM | POA: Diagnosis not present

## 2017-09-05 DIAGNOSIS — J9622 Acute and chronic respiratory failure with hypercapnia: Secondary | ICD-10-CM

## 2017-09-05 DIAGNOSIS — T8612 Kidney transplant failure: Secondary | ICD-10-CM | POA: Diagnosis not present

## 2017-09-05 DIAGNOSIS — N186 End stage renal disease: Secondary | ICD-10-CM | POA: Diagnosis not present

## 2017-09-05 DIAGNOSIS — J9621 Acute and chronic respiratory failure with hypoxia: Secondary | ICD-10-CM

## 2017-09-05 LAB — BLOOD GAS, ARTERIAL
ACID-BASE EXCESS: 0.5 mmol/L (ref 0.0–2.0)
Bicarbonate: 27.7 mmol/L (ref 20.0–28.0)
DRAWN BY: 270221
O2 Content: 10 L/min
O2 SAT: 93.7 %
PATIENT TEMPERATURE: 98
PCO2 ART: 71.9 mmHg — AB (ref 32.0–48.0)
pH, Arterial: 7.209 — ABNORMAL LOW (ref 7.350–7.450)
pO2, Arterial: 81.8 mmHg — ABNORMAL LOW (ref 83.0–108.0)

## 2017-09-05 LAB — CBC
HCT: 38 % — ABNORMAL LOW (ref 39.0–52.0)
HEMOGLOBIN: 11.7 g/dL — AB (ref 13.0–17.0)
MCH: 34.2 pg — ABNORMAL HIGH (ref 26.0–34.0)
MCHC: 30.8 g/dL (ref 30.0–36.0)
MCV: 111.1 fL — ABNORMAL HIGH (ref 78.0–100.0)
Platelets: 89 10*3/uL — ABNORMAL LOW (ref 150–400)
RBC: 3.42 MIL/uL — ABNORMAL LOW (ref 4.22–5.81)
RDW: 18.6 % — AB (ref 11.5–15.5)
WBC: 6.9 10*3/uL (ref 4.0–10.5)

## 2017-09-05 LAB — BASIC METABOLIC PANEL
ANION GAP: 16 — AB (ref 5–15)
BUN: 49 mg/dL — ABNORMAL HIGH (ref 6–20)
CALCIUM: 8.7 mg/dL — AB (ref 8.9–10.3)
CHLORIDE: 96 mmol/L — AB (ref 101–111)
CO2: 24 mmol/L (ref 22–32)
CREATININE: 5.05 mg/dL — AB (ref 0.61–1.24)
GFR calc non Af Amer: 11 mL/min — ABNORMAL LOW (ref >60)
GFR, EST AFRICAN AMERICAN: 13 mL/min — AB (ref >60)
Glucose, Bld: 98 mg/dL (ref 65–99)
Potassium: 4.7 mmol/L (ref 3.5–5.1)
SODIUM: 136 mmol/L (ref 135–145)

## 2017-09-05 LAB — PROTIME-INR
INR: 2.33
PROTHROMBIN TIME: 25.4 s — AB (ref 11.4–15.2)

## 2017-09-05 MED ORDER — HYDROMORPHONE HCL 1 MG/ML IJ SOLN: 0.5000 mg | INTRAMUSCULAR | Status: DC | PRN

## 2017-09-05 MED ORDER — METHYLPREDNISOLONE SODIUM SUCC 125 MG IJ SOLR
80.0000 mg | Freq: Two times a day (BID) | INTRAMUSCULAR | Status: DC
  Administered 2017-09-05 – 2017-09-07 (×5): 80 mg via INTRAVENOUS
  Filled 2017-09-05 (×5): qty 2

## 2017-09-05 MED ORDER — HYDROMORPHONE HCL 1 MG/ML IJ SOLN: 1.0000 mg | INTRAMUSCULAR | Status: DC | PRN

## 2017-09-05 MED ORDER — DIAZEPAM 5 MG PO TABS: 5.0000 mg | ORAL_TABLET | Freq: Four times a day (QID) | ORAL | Status: DC | PRN

## 2017-09-05 MED ORDER — HYDROMORPHONE HCL 2 MG PO TABS
1.0000 mg | ORAL_TABLET | Freq: Three times a day (TID) | ORAL | Status: DC
  Administered 2017-09-05 – 2017-09-08 (×9): 1 mg via ORAL
  Filled 2017-09-05 (×9): qty 1

## 2017-09-05 MED ORDER — WARFARIN SODIUM 2 MG PO TABS
2.0000 mg | ORAL_TABLET | Freq: Once | ORAL | Status: AC
  Administered 2017-09-05: 2 mg via ORAL
  Filled 2017-09-05: qty 1

## 2017-09-05 NOTE — Progress Notes (Signed)
Patient transported to HD on BiPAP by this RT. Vitals stable throughout transport.

## 2017-09-05 NOTE — Progress Notes (Signed)
Patient ID: [name], male   DOB: 22-Mar-1956, 61 y.o.   MRN: [id number]  [facility] Progress Note    Subjective:   "feels like I'm choking for air"   Objective:   BP 100/84 (BP Location: Right Arm)   Pulse (!) 110   Temp 98.2 F (36.8 C) (Oral)   Resp 19   Ht 5\' 10"  (1.778 m)   Wt 71 kg (156 lb 8.4 oz) Comment: due to elevated heart rate bed weight   SpO2 96%   BMI 22.46 kg/m   Intake/Output: No intake/output data recorded.   Intake/Output this shift:  No intake/output data recorded. Weight change:   Physical Exam: LJQ:GBEE distress wearing Bipap FEO:FHQRF Resp: poor air movement Abd: benign Ext: no edema  Labs: BMET Recent Labs  Lab 09/01/17 1224 09/03/17 0918 09/05/17 0207  NA 136 133* 136  K 4.2 4.5 4.7  CL 99* 95* 96*  CO2 25 27 24   GLUCOSE 120* 115* 98  BUN 30* 32* 49*  CREATININE 5.06* 4.78* 5.05*  ALBUMIN 3.1* 3.1*  --   CALCIUM 8.5* 7.9* 8.7*  PHOS  --  5.0*  --    CBC Recent Labs  Lab 09/01/17 1224 09/03/17 0917 09/05/17 0207  WBC 7.3 12.2* 6.9  NEUTROABS 6.0  --   --   HGB 11.6* 10.8* 11.7*  HCT 37.1* 36.1* 38.0*  MCV 110.4* 110.1* 111.1*  PLT 90* 109* 89*    @IMGRELPRIORS @ Medications:    . amiodarone  200 mg Oral Daily  . atorvastatin  20 mg Oral QHS  . calcium acetate  4,002 mg Oral TID WC  . guaiFENesin  600 mg Oral BID  . ipratropium-albuterol  3 mL Nebulization Q4H  . mouth rinse  15 mL Mouth Rinse BID  . methylPREDNISolone (SOLU-MEDROL) injection  80 mg Intravenous Q12H  . midodrine  10 mg Oral TID  . multivitamin  1 tablet Oral QHS  . warfarin  2 mg Oral ONCE-1800  . Warfarin - Pharmacist Dosing Inpatient   Does not apply q1800    Dialysis:[facility] MWF 4h 77kg Hep none 2K/3Ca bath R IJ TDC   Assessment/ Plan:   1. Hypoxia- multifactorial- + metapneumovirus and CXR with interstitial pattern requiring BiPap. 2. ESRD will plan for HD today with UF as bp tolerates 3. Anemia: stable, no  ESA 4. CKD-MBD: cont with renal diet and binders 5. Nutrition: renal diet 6. Chronic hypotension on midodrine 10mg  tid 7. Disposition- agree with Palliative care to help set goals/limits of care.  Donetta Potts, MD [place] Pager [phone] 09/05/2017, 10:27 AM

## 2017-09-05 NOTE — Progress Notes (Signed)
HD tx initiated via HD cath @ 1737 by Rodell Perna, RN w/o problem, pull/push/flush equally w/o problem, received pt from [place], [place] @ [phone], will cont to monitor while on HD tx

## 2017-09-05 NOTE — Progress Notes (Signed)
ANTICOAGULATION CONSULT NOTE - Follow Up Consult  Pharmacy Consult for Warfarin Indication: atrial fibrillation  Allergies  Allergen Reactions  . Adhesive [Tape] Rash    Please use paper tape  . Amoxicillin Rash    migraine  . Ativan [Lorazepam] Anxiety  . Bupropion Anxiety  . Diphenhydramine Itching and Anxiety    Only with IV doses. Tolerates oral.  . Penicillins Other (See Comments)    MIGRAINE  Has patient had a PCN reaction causing immediate rash, facial/tongue/throat swelling, SOB or lightheadedness with hypotension: no Has patient had a PCN reaction causing severe rash involving mucus membranes or skin necrosis: No Has patient had a PCN reaction that required hospitalization No Has patient had a PCN reaction occurring within the last 10 years: No If all of the above answers are "NO", then may proceed with Cephalosporin use.    Patient Measurements: Height: 5\' 10"  (177.8 cm) Weight: 156 lb 8.4 oz (71 kg)(due to elevated heart rate bed weight ) IBW/kg (Calculated) : 73 Heparin Dosing Weight: n/a  Vital Signs: Temp: 98.2 F (36.8 C) (12/31 0826) Temp Source: Oral (12/31 0826) BP: 100/84 (12/31 0826) Pulse Rate: 110 (12/31 0826)  Labs: Recent Labs    09/03/17 0242 09/03/17 0917 09/03/17 0918 09/04/17 0450 09/05/17 0207  HGB  --  10.8*  --   --  11.7*  HCT  --  36.1*  --   --  38.0*  PLT  --  109*  --   --  89*  LABPROT 37.2*  --   --  25.3* 25.4*  INR 3.80  --   --  2.32 2.33  CREATININE  --   --  4.78*  --  5.05*    Estimated Creatinine Clearance: 15.2 mL/min (A) (by C-G formula based on SCr of 5.05 mg/dL (H)).   Assessment: 61yom continues on warfarin for afib. INR jumped to 3.8 12/29 following Levaquin initiation. One dose was held on 12/30 and INR fell back to within goal at 2.32. Patient has multiple drug-drug interactions including Levaquin and amiodarone. Will continue to monitor drug interactions closely.  Home warfarin dose = 3.75mg   daily  Goal of Therapy:  INR 2-3 Monitor platelets by anticoagulation protocol: Yes   Plan:  1) Warfarin 2mg  tonight 2) Daily INR 3) Monitor for s/sx of bleeding  Georga Bora, PharmD Clinical Pharmacist 09/05/2017 9:49 AM

## 2017-09-05 NOTE — Progress Notes (Signed)
Subjective:  [name] was sitting up in bed with BiPAP on this morning. Wife was at bedside. She reports that last night, they did try to wean him to HFNC for a brief period of time, but his oxygen saturations dropped to the 70s-80s, so BiPAP was replaced. He denies chest pain, palpitations, leg edema, or subjective fevers. Continues to have a mild productive cough. He does feel that his breathing is better than it was yesterday, but not at baseline.  Objective:  Vital signs in last 24 hours: Vitals:   09/04/17 2347 09/05/17 0100 09/05/17 0400 09/05/17 0414  BP: 102/82     Pulse: (!) 110 (!) 123 (!) 110 (!) 110  Resp: 20 20 20 19   Temp:      TempSrc: Axillary     SpO2: 96% 95% 98% 97%  Weight:      Height:       GEN: Pleasant, elderly male lying in bed in NAD. Able to speak in full sentences. On BiPAP with O2 sat >95%. Alert. RESP: Diffuse expiratory wheezes and coarse breath sounds. Mildly increased accessory muscle use. Able to speak in full sentences with some pauses. CV: Tachycardic, regular rhythm. 2/6 systolic murmur heard best at apex. No LE edema. ABD: Soft. Non-tender. Non-distended. Normoactive bowel sounds. EXT: No LE edema. Warm and well perfused. NEURO: Cranial nerves II-XII grossly intact. Able to lift all four extremities against gravity. No apparent audiovisual hallucinations. Speech fluent and appropriate. PSYCH: Patient is calm and pleasant. Appropriate affect. Well-groomed; speech is appropriate and on-subject.  Labs CBC Latest Ref Rng & Units 09/05/2017 09/03/2017 09/01/2017  WBC 4.0 - 10.5 K/uL 6.9 12.2(H) 7.3  Hemoglobin 13.0 - 17.0 g/dL 11.7(L) 10.8(L) 11.6(L)  Hematocrit 39.0 - 52.0 % 38.0(L) 36.1(L) 37.1(L)  Platelets 150 - 400 K/uL 89(L) 109(L) 90(L)   CMP Latest Ref Rng & Units 09/05/2017 09/03/2017 09/01/2017  Glucose 65 - 99 mg/dL 98 115(H) 120(H)  BUN 6 - 20 mg/dL 49(H) 32(H) 30(H)  Creatinine 0.61 - 1.24 mg/dL 5.05(H) 4.78(H) 5.06(H)  Sodium  135 - 145 mmol/L 136 133(L) 136  Potassium 3.5 - 5.1 mmol/L 4.7 4.5 4.2  Chloride 101 - 111 mmol/L 96(L) 95(L) 99(L)  CO2 22 - 32 mmol/L 24 27 25   Calcium 8.9 - 10.3 mg/dL 8.7(L) 7.9(L) 8.5(L)  Total Protein 6.5 - 8.1 g/dL - - 7.8  Total Bilirubin 0.3 - 1.2 mg/dL - - 1.3(H)  Alkaline Phos 38 - 126 U/L - - 131(H)  AST 15 - 41 U/L - - 26  ALT 17 - 63 U/L - - 18   INR 2.33 BCx NGTD x3d  Assessment/Plan:  Principal Problem:   Acute on chronic respiratory failure with hypoxia and hypercapnia (HCC) Active Problems:   ESRD on dialysis (HCC)   Chronic diastolic congestive heart failure (HCC)   COPD exacerbation (HCC)   Human metapneumovirus (hMPV) pneumonia  [name] is a 61 yo M with a history of Multiple Sclerosis, CAD (s/p CABGx2), A. Fib/flutter, Hypotension, OSA, COPD, CHF (EF 55-60%, 08/03/17), GERD, and thrombocytopenia who presented with acute on chronic respiratory failure 2/2 COPD exacerbation, likely triggered by metapneumovirus. Shortness of breath and O2 saturation had been improving, but acutely worsened yesterday.  COPD Exacerbation, likely triggered by metapneumovirus Patient had been improving until 12/30 AM when he acutely became more short of breath, possibly triggered by anxiety? Remained stable overnight, on BiPAP in Stepdown. BCx NGTD x3d. Started IV Levaquin yesterday and IV steroids. Palliative Medicine consulted and  will see today. - Duoneb q4h - Transition to IV solumedrol 80mg  BID - BiPAP - Wean O2 as able to O2 saturation goal of 88-92% - IV Levaquin 500mg  q48h (next dose Jan 1) - Patient is confirmed DNR/DNI - Palliative Medicine consulted for symptom management and will see today; appreciate their assistance  Hypotension Chronic and stable. Home regimen includes midodrine 10mg  TID - Continue home Midodrine 10mg  TID  ESRD on HD MWF HD 12/28 and 12/29. Will get dialysis today. - Nephrology consulted; appreciate their assistance - HD today - Phoslo TID  AC  A. Fib In sinus rhythm. HR in the 100s-110s, likely exacerbated by duonebs. CHADS2-VASc score is at least 2, with a 2.9% annual risk of CVA. On Warfarin, INR today is 2.33 - Warfarin per pharmacy - Continue home amiodarone 200mg  daily - Telemetry  Dispo: Anticipated discharge in approximately 2-3 day(s).  Colbert Ewing, MD 09/05/2017, 7:16 AM Pager: Mamie Nick [phone]

## 2017-09-05 NOTE — Progress Notes (Addendum)
Upon arrival, patient on 4 Lpm nasal cannula with sats 87%, HR 131, and RR 19. Patient with fine crackles and a wet wheeze throughout all lung fields. States "he feels as if hes drowning" Wife wants to shave patient. BiPAP will be placed when she is finished.

## 2017-09-05 NOTE — Progress Notes (Signed)
HD tx completed @ 2137 w/o problem other than soft bp that required day HD RN decreasing UF goal from 3L to 2L, UF goal of 2L still met (2-3L was goal), blood rinsed back, VSS w/ soft bp still, report called to Erlene Quan, RN

## 2017-09-05 NOTE — Progress Notes (Signed)
Medicine attending: Clinical status and database reviewed with resident physician Dr. Merri Ray and I concur with her evaluation and management plan which we discussed together.  He remains dependent on BiPAP to maintain borderline oxygenation.  We will continue to try to wean off the BiPAP as tolerated.  To continue steroids and antibiotics and other supportive measures. Palliative care to consult.

## 2017-09-06 LAB — CULTURE, BLOOD (ROUTINE X 2)
CULTURE: NO GROWTH
Culture: NO GROWTH
SPECIAL REQUESTS: ADEQUATE
Special Requests: ADEQUATE

## 2017-09-06 LAB — PROTIME-INR
INR: 3.05
Prothrombin Time: 31.3 seconds — ABNORMAL HIGH (ref 11.4–15.2)

## 2017-09-06 MED ORDER — HYDROXYZINE HCL 25 MG PO TABS
25.0000 mg | ORAL_TABLET | Freq: Three times a day (TID) | ORAL | Status: DC | PRN
  Administered 2017-09-06: 25 mg via ORAL
  Filled 2017-09-06: qty 1

## 2017-09-06 MED ORDER — WARFARIN SODIUM 1 MG PO TABS
1.0000 mg | ORAL_TABLET | Freq: Once | ORAL | Status: AC
  Administered 2017-09-06: 1 mg via ORAL
  Filled 2017-09-06: qty 1

## 2017-09-06 NOTE — Progress Notes (Signed)
Patient ID: [name], male   DOB: 18-Sep-1955, 62 y.o.   MRN: [id number]  [facility] Progress Note    Subjective:   Feels better today after HD and tolerated 2 liters UF   Objective:   BP 92/71 (BP Location: Right Arm)   Pulse (!) 116   Temp 97.7 F (36.5 C) (Axillary)   Resp 17   Ht 5\' 10"  (1.778 m)   Wt 68.6 kg (151 lb 3.8 oz)   SpO2 97%   BMI 21.70 kg/m   Intake/Output: I/O last 3 completed shifts: In: 160 [P.O.:60; IV Piggyback:100] Out: 2001 [Other:2001]   Intake/Output this shift:  No intake/output data recorded. Weight change:   Physical Exam: Gen: NAD [id number] Resp: scattered end exp wheezes bilaterally Abd: benign Ext: no edema  Labs: BMET Recent Labs  Lab 09/01/17 1224 09/03/17 0918 09/05/17 0207  NA 136 133* 136  K 4.2 4.5 4.7  CL 99* 95* 96*  CO2 25 27 24   GLUCOSE 120* 115* 98  BUN 30* 32* 49*  CREATININE 5.06* 4.78* 5.05*  ALBUMIN 3.1* 3.1*  --   CALCIUM 8.5* 7.9* 8.7*  PHOS  --  5.0*  --    CBC Recent Labs  Lab 09/01/17 1224 09/03/17 0917 09/05/17 0207  WBC 7.3 12.2* 6.9  NEUTROABS 6.0  --   --   HGB 11.6* 10.8* 11.7*  HCT 37.1* 36.1* 38.0*  MCV 110.4* 110.1* 111.1*  PLT 90* 109* 89*    @IMGRELPRIORS @ Medications:    . amiodarone  200 mg Oral Daily  . atorvastatin  20 mg Oral QHS  . calcium acetate  4,002 mg Oral TID WC  . guaiFENesin  600 mg Oral BID  . HYDROmorphone  1 mg Oral TID  . ipratropium-albuterol  3 mL Nebulization Q4H  . mouth rinse  15 mL Mouth Rinse BID  . methylPREDNISolone (SOLU-MEDROL) injection  80 mg Intravenous Q12H  . midodrine  10 mg Oral TID  . multivitamin  1 tablet Oral QHS  . Warfarin - Pharmacist Dosing Inpatient   Does not apply q1800   Dialysis:[facility] MWF 4h 77kg (new edw around 68-68.5kg) Hep none 2K/3Ca bath R IJ TDC    Assessment/ Plan:   1. Hypoxia- multifactorial- + metapneumovirus and CXR with interstitial pattern requiring BiPap. 1. Continue with  prednisone 2. ESRD will plan for HD tomorrow with UF as bp tolerates 3. 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 Nutrition: renal diet 6. Chronic hypotension on midodrine 10mg  tid 7. Disposition- agree with Palliative care to help set goals/limits of care.    Donetta Potts, MD [place] Pager [phone] 09/06/2017, 1:06 PM

## 2017-09-06 NOTE — Progress Notes (Signed)
Patient is to go home on Oxymizer; Butch Penny RN with [place] made aware; Mindi Slicker RN,MHA,BSn [phone]

## 2017-09-06 NOTE — Progress Notes (Signed)
Medicine attending: I examined this patient today together with resident physician Dr. Colbert Ewing and I concur with her evaluation and management plan which we discussed together. We appreciate palliative care input.  We will follow up on recommendations for a high flow nasal cannula as an alternative to BiPAP.  Patient's wife was engaged with Dr. Hilma Favors and reinforces main goal of care is to keep him as comfortable as possible. Overall condition has stabilized temporarily on BiPAP.  He appears much more comfortable.  Good skin color.  No cyanosis.  Better air movement.  Still with prolonged expiratory phase but not as tight. He continues on intermittent dialysis for his end-stage renal disease. Decision on continuing dialysis will be tied closely to his respiratory status which remains tenuous at best.  We agree with as needed low doses of narcotics and anxiolytics.

## 2017-09-06 NOTE — Consult Note (Signed)
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 He has been on hemodialysis since 2000, failed kidney transplantation. He is currently hospitalized with severe, life threatening respiratory failure from viral pneumonia.   I met with his wife [name] while patient was in HD and due to Bipap it was difficult for him to participate in the conversation. Joy shared with me a summary of their struggles and victories through this very difficult illness. Despite complications and problems -he has surpassed his prognosis- he was told he would never see his daughter graduate from high school but he has gone on to see her graduate high school, college and have a family of her own. 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  His wife [name] believes things are very different now- he has been rapidly declining over the past few months and according to her has virtually no quality of life- he requires full assistance with his ADLs but can walk with asistance and has been able to physically tolerate HD 3Xweek.  Her main concern right now is his respiratory failure- he has been dependent on the bipap and has extremely air hunger and anxiety when it is removed-he has not eaten in days according to her because he cannot come off the bipap long enough.  Palliative care consult was for symptom management of his dyspnea and anxiety and for serious illness support.  We talked about how serious his condition was and several possible trajectories- she is prepared for the worst but maintaining hope. We discussed him possibly not surviving this hospitalization and also the possibility that he may physically be too weak and unable to continue HD- if HD becomes too difficult to continue she wants to take him home to die with hospice.    She was  explicit in a desire for him not to suffer-he had bad air hunger last PM and she said that was very hard to watch him suffocate. We discussed symptom management in detail.  Recommendations:  1. Consider (High Flow Oxygen) HFO instead of NIPPV- it allows for him to talk and eat also less skin breakdown -and the end point is dependent on improvement of viral PNA (possibly reversible), he also has a hx of CHF,COPD and Lung Ca complicating this decision. Will need to carefully consider an exit strategy for the High Flow if a decision is made to use this intervention.  2. Dyspnea and Anxiety: I discussed this in detail. I recommended very low dose scheduled hydromorphone (DOC in renal) for air hunger with two levels of IV hydromorphone for moderate or sever dyspnea. Added on PRN diazepam [facility] renal) for sedation and anxiety his wife wants his comfort to be a top priority.  He has a DNR order in place.  Our team will continue to track and follow.  Lane Hacker, DO Palliative Medicine [phone]  Time: 70 min Greater than 50%  of this time was spent counseling and coordinating care related to the above assessment and plan.

## 2017-09-06 NOTE — Progress Notes (Addendum)
Subjective:  [name] was sitting up in bed with BiPAP on this morning. Wife was at bedside. He states that his breathing is improved. He denies chest pain, palpitations, leg edema, or subjective fevers. Palliative Medicine was able to have a conversation with his wife yesterday, and will follow with [place] and speak to patient today. Will try for high flow oxygen therapy instead of NIPPV to allow patient to be able to eat and interact more.  Objective:  Vital signs in last 24 hours: Vitals:   09/06/17 0400 09/06/17 0427 09/06/17 0724 09/06/17 0831  BP:    92/71  Pulse: (!) 115   (!) 117  Resp: 19   17  Temp:    97.6 F (36.4 C)  TempSrc:    Oral  SpO2: 94% 95% 98% 97%  Weight:      Height:       GEN: Pleasant, elderly male sitting in bed in NAD. Able to speak in full sentences. On BiPAP with O2 sat >95%. Alert. RESP: Diffuse expiratory wheezes and coarse breath sounds. CV: Tachycardic, regular rhythm. 2/6 systolic murmur heard best at apex. Prominent PMI. No LE edema. ABD: Soft. Non-tender. Non-distended. Normoactive bowel sounds. EXT: No LE edema. Warm and well perfused. NEURO: Cranial nerves II-XII grossly intact. Able to lift all four extremities against gravity. No apparent audiovisual hallucinations. Speech fluent and appropriate. PSYCH: Patient is calm and pleasant. Appropriate affect. Well-groomed; speech is appropriate and on-subject.  Labs CBC Latest Ref Rng & Units 09/05/2017 09/03/2017 09/01/2017  WBC 4.0 - 10.5 K/uL 6.9 12.2(H) 7.3  Hemoglobin 13.0 - 17.0 g/dL 11.7(L) 10.8(L) 11.6(L)  Hematocrit 39.0 - 52.0 % 38.0(L) 36.1(L) 37.1(L)  Platelets 150 - 400 K/uL 89(L) 109(L) 90(L)   CMP Latest Ref Rng & Units 09/05/2017 09/03/2017 09/01/2017  Glucose 65 - 99 mg/dL 98 115(H) 120(H)  BUN 6 - 20 mg/dL 49(H) 32(H) 30(H)  Creatinine 0.61 - 1.24 mg/dL 5.05(H) 4.78(H) 5.06(H)  Sodium 135 - 145 mmol/L 136 133(L) 136  Potassium 3.5 - 5.1 mmol/L 4.7 4.5 4.2  Chloride 101 - 111  mmol/L 96(L) 95(L) 99(L)  CO2 22 - 32 mmol/L 24 27 25   Calcium 8.9 - 10.3 mg/dL 8.7(L) 7.9(L) 8.5(L)  Total Protein 6.5 - 8.1 g/dL - - 7.8  Total Bilirubin 0.3 - 1.2 mg/dL - - 1.3(H)  Alkaline Phos 38 - 126 U/L - - 131(H)  AST 15 - 41 U/L - - 26  ALT 17 - 63 U/L - - 18   INR 3.05 BCx NGTD x4d  Assessment/Plan:  Principal Problem:   Acute on chronic respiratory failure with hypoxia and hypercapnia (HCC) Active Problems:   ESRD on dialysis (HCC)   Chronic diastolic congestive heart failure (HCC)   COPD exacerbation (HCC)   Human metapneumovirus (hMPV) pneumonia  [name] is a 62 yo M with a history of Multiple Sclerosis, CAD (s/p CABGx2), A. Fib/flutter, Hypotension, OSA, COPD, CHF (EF 55-60%, 08/03/17), GERD, and thrombocytopenia who presented with acute on chronic respiratory failure 2/2 COPD exacerbation, likely triggered by metapneumovirus. Shortness of breath improving, but patient continues on BiPAP. Palliative Medicine team following to assist with symptom control.  COPD Exacerbation, likely triggered by metapneumovirus Remaining stable, still on BiPAP in Stepdown. Palliative Medicine team is following to assist with symptom control. BCx NGTD x4d. Continuing antibiotics and steroids. Family is aware that prognosis is guarded and are hopeful but realistic. May need either home hospice or hospice facility on discharge, depending on clinical course. -  Palliative Medicine consulted; appreciate their assistance - Duoneb q4h - IV solumedrol 80mg  BID - BiPAP - Wean O2 as able to O2 saturation goal of 88-92% - IV Levaquin 500mg  q48h (12/30-) - Patient is confirmed DNR/DNI - PO dilaudid 1mg  TID - IV dilaudid 0.5mg  q4h PRN for moderate dyspnea - IV dilaudid 1mg  q2h PRN for severe dyspnea - PO valium 5mg  q6h PRN for anxiety  Hypotension Chronic and stable. Home regimen includes midodrine 10mg  TID - Continue home Midodrine 10mg  TID  ESRD on HD MWF HD 12/31. Tolerated well. -  Nephrology consulted; appreciate their assistance - HD yesterday - Phoslo TID AC  A. Fib In sinus rhythm. HR in the 100s-110s, likely exacerbated by duonebs. CHADS2-VASc score is at least 2, with a 2.9% annual risk of CVA. On Warfarin, INR today is 3.05 - Continue warfarin per pharmacy - Continue home amiodarone 200mg  daily - Telemetry  Dispo: Anticipated discharge pending clinical work-up.  Colbert Ewing, MD 09/06/2017, 9:54 AM Pager: Mamie Nick [phone]

## 2017-09-06 NOTE — Progress Notes (Addendum)
Pt HR sustaining 120-130's. Pt is asymptomatic. BP is 55-20 (systolic) RR 80-22. OSats 97-98 % on 4L oxymizer. Notified Dr. Heber [place].  Will continue to monitor for changes.   Per Dr Heber Jeddo, notify if HR sustains in the 130's or if patient becomes symptomatic.

## 2017-09-06 NOTE — Progress Notes (Signed)
ANTICOAGULATION CONSULT NOTE - Follow Up Consult  Pharmacy Consult for Warfarin Indication: atrial fibrillation  Allergies  Allergen Reactions  . Adhesive [Tape] Rash    Please use paper tape  . Amoxicillin Rash    migraine  . Ativan [Lorazepam] Anxiety  . Bupropion Anxiety  . Diphenhydramine Itching and Anxiety    Only with IV doses. Tolerates oral.  . Penicillins Other (See Comments)    MIGRAINE  Has patient had a PCN reaction causing immediate rash, facial/tongue/throat swelling, SOB or lightheadedness with hypotension: no Has patient had a PCN reaction causing severe rash involving mucus membranes or skin necrosis: No Has patient had a PCN reaction that required hospitalization No Has patient had a PCN reaction occurring within the last 10 years: No If all of the above answers are "NO", then may proceed with Cephalosporin use.    Patient Measurements: Height: 5\' 10"  (177.8 cm) Weight: 151 lb 3.8 oz (68.6 kg) IBW/kg (Calculated) : 73 Heparin Dosing Weight: n/a  Vital Signs: Temp: 97.7 F (36.5 C) (01/01 1123) Temp Source: Axillary (01/01 1123) BP: 92/71 (01/01 1123) Pulse Rate: 116 (01/01 1123)  Labs: Recent Labs    09/04/17 0450 09/05/17 0207 09/06/17 0134  HGB  --  11.7*  --   HCT  --  38.0*  --   PLT  --  89*  --   LABPROT 25.3* 25.4* 31.3*  INR 2.32 2.33 3.05  CREATININE  --  5.05*  --     Estimated Creatinine Clearance: 14.7 mL/min (A) (by C-G formula based on SCr of 5.05 mg/dL (H)).   Assessment: 61yom continues on warfarin for afib. INR jumped to 3.8 12/29 following Levaquin initiation. One dose was held on 12/30 and INR is on the higher end of goal today. Patient has multiple drug-drug interactions including Levaquin and amiodarone.   Home warfarin dose = 3.75mg  daily  Goal of Therapy:  INR 2-3 Monitor platelets by anticoagulation protocol: Yes   Plan:  1) Warfarin 1mg  tonight 2) Daily INR  Hildred Laser, Pharm D 09/06/2017 2:58 PM

## 2017-09-07 DIAGNOSIS — Z66 Do not resuscitate: Secondary | ICD-10-CM

## 2017-09-07 DIAGNOSIS — I872 Venous insufficiency (chronic) (peripheral): Secondary | ICD-10-CM

## 2017-09-07 LAB — CBC
HEMATOCRIT: 35.3 % — AB (ref 39.0–52.0)
HEMOGLOBIN: 11 g/dL — AB (ref 13.0–17.0)
MCH: 34.1 pg — ABNORMAL HIGH (ref 26.0–34.0)
MCHC: 31.2 g/dL (ref 30.0–36.0)
MCV: 109.3 fL — AB (ref 78.0–100.0)
Platelets: 93 10*3/uL — ABNORMAL LOW (ref 150–400)
RBC: 3.23 MIL/uL — ABNORMAL LOW (ref 4.22–5.81)
RDW: 18.2 % — AB (ref 11.5–15.5)
WBC: 9.9 10*3/uL (ref 4.0–10.5)

## 2017-09-07 LAB — RENAL FUNCTION PANEL
Albumin: 3 g/dL — ABNORMAL LOW (ref 3.5–5.0)
Anion gap: 13 (ref 5–15)
BUN: 57 mg/dL — AB (ref 6–20)
CHLORIDE: 98 mmol/L — AB (ref 101–111)
CO2: 27 mmol/L (ref 22–32)
Calcium: 8.1 mg/dL — ABNORMAL LOW (ref 8.9–10.3)
Creatinine, Ser: 4.95 mg/dL — ABNORMAL HIGH (ref 0.61–1.24)
GFR calc Af Amer: 13 mL/min — ABNORMAL LOW (ref >60)
GFR, EST NON AFRICAN AMERICAN: 11 mL/min — AB (ref >60)
Glucose, Bld: 128 mg/dL — ABNORMAL HIGH (ref 65–99)
POTASSIUM: 4 mmol/L (ref 3.5–5.1)
Phosphorus: 4.4 mg/dL (ref 2.5–4.6)
Sodium: 138 mmol/L (ref 135–145)

## 2017-09-07 LAB — PROTIME-INR
INR: 3.48
PROTHROMBIN TIME: 34.7 s — AB (ref 11.4–15.2)

## 2017-09-07 MED ORDER — MIDODRINE HCL 5 MG PO TABS
ORAL_TABLET | ORAL | Status: AC
  Filled 2017-09-07: qty 2

## 2017-09-07 MED ORDER — PREDNISONE 50 MG PO TABS
60.0000 mg | ORAL_TABLET | Freq: Every day | ORAL | Status: DC
  Administered 2017-09-07 – 2017-09-08 (×2): 60 mg via ORAL
  Filled 2017-09-07 (×2): qty 1

## 2017-09-07 NOTE — Procedures (Signed)
I was present at this dialysis session. I have reviewed the session itself and made appropriate changes.   Filed Weights   09/05/17 1725 09/05/17 2144 09/07/17 0730  Weight: 70.6 kg (155 lb 10.3 oz) 68.6 kg (151 lb 3.8 oz) 70.3 kg (154 lb 15.7 oz)    Recent Labs  Lab 09/07/17 0730  NA 138  K 4.0  CL 98*  CO2 27  GLUCOSE 128*  BUN 57*  CREATININE 4.95*  CALCIUM 8.1*  PHOS 4.4    Recent Labs  Lab 09/01/17 1224 09/03/17 0917 09/05/17 0207  WBC 7.3 12.2* 6.9  NEUTROABS 6.0  --   --   HGB 11.6* 10.8* 11.7*  HCT 37.1* 36.1* 38.0*  MCV 110.4* 110.1* 111.1*  PLT 90* 109* 89*    Scheduled Meds: . amiodarone  200 mg Oral Daily  . atorvastatin  20 mg Oral QHS  . calcium acetate  4,002 mg Oral TID WC  . guaiFENesin  600 mg Oral BID  . HYDROmorphone  1 mg Oral TID  . ipratropium-albuterol  3 mL Nebulization Q4H  . mouth rinse  15 mL Mouth Rinse BID  . methylPREDNISolone (SOLU-MEDROL) injection  80 mg Intravenous Q12H  . midodrine  10 mg Oral TID  . multivitamin  1 tablet Oral QHS  . Warfarin - Pharmacist Dosing Inpatient   Does not apply q1800   Continuous Infusions: . sodium chloride    . sodium chloride    . sodium chloride    . sodium chloride    . sodium chloride    . levofloxacin (LEVAQUIN) IV Stopped (09/06/17 0030)   PRN Meds:.sodium chloride, sodium chloride, sodium chloride, sodium chloride, sodium chloride, acetaminophen, alteplase, antiseptic oral rinse, diazepam, heparin, HYDROmorphone (DILAUDID) injection, HYDROmorphone (DILAUDID) injection, hydrOXYzine, lidocaine (PF), lidocaine-prilocaine, pentafluoroprop-tetrafluoroeth, senna-docusate    Dialysis:[facility] MWF 4h 77kg (new edw around 68-68.5kg) Hep none 2K/3Ca bath R IJ TDC   Assessment/Plan: 1. Hypoxia- multifactorial- + metapneumovirus and CXR with interstitial pattern requiring BiPap. 1. Continue with prednisone 2. ESRDwill plan for HD tomorrow with UF as bp tolerates 3. 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 Nutrition:renal diet 6. Chronic hypotension on midodrine 10mg  tid 7. Disposition- agree with Palliative care to help set goals/limits of care     Donetta Potts,  MD 09/07/2017, 8:49 AM

## 2017-09-07 NOTE — Progress Notes (Signed)
Medicine attending: I examined this patient today together with resident physician Dr. Thomasene Ripple and I concur with her evaluation and management plan which we discussed together. Significant stabilization back to baseline.  Successfully transitioned off BiPAP and on oxymizer high flow nasal cannula oxygen with FiO2 35%.  Oxygen saturation 100%.  Probably higher than he needs.  He is alert, oriented, and cooperative.  Currently getting a dialysis session.  Moving air well over his lungs.  Still with prolonged expiratory phase but minimal wheezing. INR creeping up.  3.5.  Pharmacy adjusting warfarin dose. It appears at this time that we will be able to get him out of the hospital soon.  We will transition off IV steroids and begin oral prednisone today.  He will complete planned course of antibiotics tomorrow.

## 2017-09-07 NOTE — Progress Notes (Signed)
Subjective:  Patient seen sitting comfortably in bed during dialysis session. States he feels much better than on previous days and denies any significant cough and shortness of breath. Thinks that his symptoms are overall improving and that he would like to go home soon.   Objective:  Vital signs in last 24 hours: Vitals:   09/06/17 2357 09/07/17 0014 09/07/17 0400 09/07/17 0420  BP: 97/78   103/78  Pulse: (!) 117   (!) 117  Resp:    19  Temp: 98 F (36.7 C)   98.3 F (36.8 C)  TempSrc: Axillary   Oral  SpO2: 100% 100% 100% 100%  Weight:      Height:       Physical Exam  Constitutional:  Chronically sick appearing man laying in bed in no acute distress.  Respiratory:  Patient barrel chested with HFNC in place. Speaking in full sentences. No accessory muscle use or nasal flaring. No signs of cyanosis. Intermittent end-expiratory wheezing without crackles appreciated.   GI: Soft. He exhibits no distension. There is no tenderness.  Musculoskeletal: He exhibits no edema (of bilateral lower extremities) or tenderness (of bilateral lower extremities).  Skin:  Hyperpigmentation of bilateral lower extremities consistent with chronic venous stasis. No overlying erythema, warmth, or ulcerations.   Psychiatric: He has a normal mood and affect. His behavior is normal. Thought content normal.   Assessment/Plan:  Principal Problem:   Acute on chronic respiratory failure with hypoxia and hypercapnia (HCC) Active Problems:   ESRD on dialysis (HCC)   Chronic diastolic congestive heart failure (HCC)   COPD exacerbation (HCC)   Human metapneumovirus (hMPV) pneumonia  Mr. Tuckey is a 62 yo M with a history of Multiple Sclerosis, CAD (s/p CABGx2), A. Fib/flutter, Hypotension, OSA, COPD, CHF (EF 55-60%, 08/03/17), GERD, and thrombocytopenia who presented with acute on chronic respiratory failure 2/2 COPD exacerbation, likely triggered by metapneumovirus. Shortness of breath improving, but  patient continues on BiPAP. Palliative Medicine team following to assist with symptom control.  Acute on chronic respiratory failure with hypoxia 2/2 acute COPD exacerbation, +metapneumovirus:  Patient's oxygen saturation and breathing have improved with use of oximizer. Patient reports subjective improvement in his symptoms, which is encouraging. Will transition to oral steroids today to see how patient does. Will treat COPD exacerbation for 5 total days with end date of antibiotics for 09/08/2016. Overall prognosis remains poor and recommendations from palliative care colleagues are appreciated. Patient would like to go home soon, so will hopefully be stable for discharge tomorrow after transition to PO steroids and last dose of antibiotics for treatment of COPD exacerbation.  -Palliative Medicine consulted -Duoneb q4h -Transition to PO prednisone 60 mg today -IV Levaquin 500mg  q48h, last dose planned tomorrow (12/30-09/08/2066) -PO dilaudid 1mg  TID with IV dilaudid 0.5mg  q4h PRN for moderate dyspnea and IV dilaudid 63mg  q2h PRN for severe dyspnea, per recommendations from palliative -PO valium 5mg  q6h PRN for anxiety  Hypotension: Chronic and stable. -Continue home Midodrine 10mg  TID  ESRD on HD MWF: Tolerated HD today and back on regular dialysis schedule. P -Nephrology consulted; appreciate their assistance  A. Fib: Currently NSR. HR in the 110s-130s, overnight. This has been happening throughout hospitalization, suspect elevated HR 2/2 scheduled duonebs. On warfarin, INR today is 3.48 today -Continue warfarin per pharmacy -Continue home amiodarone 200mg  daily -Telemetry  FEN/GI: -Renal diet with 1.2L volume restriction -No IVF, replace electrolytes as needed -Senokot for constipation  VTE Prophylaxis: Warfarin per pharmacy Code Status: DNR/DNI  Dispo: Anticipated  discharge in 1-2 days if patient continues to clinically improve.  Thomasene Ripple, MD 09/07/2017, 7:28 AM Pager: Mamie Nick  [phone]

## 2017-09-07 NOTE — Progress Notes (Addendum)
ANTICOAGULATION CONSULT NOTE - Follow Up Consult  Pharmacy Consult for Warfarin Indication: atrial fibrillation  Allergies  Allergen Reactions  . Adhesive [Tape] Rash    Please use paper tape  . Amoxicillin Rash    migraine  . Ativan [Lorazepam] Anxiety  . Bupropion Anxiety  . Diphenhydramine Itching and Anxiety    Only with IV doses. Tolerates oral.  . Penicillins Other (See Comments)    MIGRAINE  Has patient had a PCN reaction causing immediate rash, facial/tongue/throat swelling, SOB or lightheadedness with hypotension: no Has patient had a PCN reaction causing severe rash involving mucus membranes or skin necrosis: No Has patient had a PCN reaction that required hospitalization No Has patient had a PCN reaction occurring within the last 10 years: No If all of the above answers are "NO", then may proceed with Cephalosporin use.    Patient Measurements: Height: 5\' 10"  (177.8 cm) Weight: 151 lb 3.8 oz (68.6 kg) IBW/kg (Calculated) : 73 Heparin Dosing Weight: n/a  Vital Signs: Temp: 98.3 F (36.8 C) (01/02 0420) Temp Source: Oral (01/02 0420) BP: 103/78 (01/02 0420) Pulse Rate: 117 (01/02 0420)  Labs: Recent Labs    09/05/17 0207 09/06/17 0134 09/07/17 0338  HGB 11.7*  --   --   HCT 38.0*  --   --   PLT 89*  --   --   LABPROT 25.4* 31.3* 34.7*  INR 2.33 3.05 3.48  CREATININE 5.05*  --   --    Estimated Creatinine Clearance: 14.7 mL/min (A) (by C-G formula based on SCr of 5.05 mg/dL (H)).  Assessment: 61yom continues on warfarin for afib. INR jumped to 3.8 12/29 following Levaquin initiation. One dose was held on 12/30. INR was on the higher end of goal yesterday with small dose administered yesterday evening. Patient has multiple drug-drug interactions including Levaquin and amiodarone. CBC stable, no bleeding noted  Today INR supratherapeutic 3.48  Home warfarin dose = 3.75mg  daily  Goal of Therapy:  INR 2-3 Monitor platelets by anticoagulation protocol:  Yes   Plan:  1) Hold warfarin tonight 2) Daily INR 3) Monitor for s/sx of bleeding  Georga Bora, PharmD Clinical Pharmacist 09/07/2017 8:02 AM

## 2017-09-08 ENCOUNTER — Inpatient Hospital Stay (HOSPITAL_COMMUNITY): Payer: Medicare Other

## 2017-09-08 DIAGNOSIS — Z91048 Other nonmedicinal substance allergy status: Secondary | ICD-10-CM

## 2017-09-08 DIAGNOSIS — Z888 Allergy status to other drugs, medicaments and biological substances status: Secondary | ICD-10-CM

## 2017-09-08 DIAGNOSIS — Z881 Allergy status to other antibiotic agents status: Secondary | ICD-10-CM

## 2017-09-08 DIAGNOSIS — Z880 Allergy status to penicillin: Secondary | ICD-10-CM

## 2017-09-08 LAB — PROTIME-INR
INR: 3
Prothrombin Time: 30.9 seconds — ABNORMAL HIGH (ref 11.4–15.2)

## 2017-09-08 MED ORDER — IPRATROPIUM-ALBUTEROL 0.5-2.5 (3) MG/3ML IN SOLN
3.0000 mL | Freq: Four times a day (QID) | RESPIRATORY_TRACT | Status: DC
  Administered 2017-09-08: 3 mL via RESPIRATORY_TRACT
  Filled 2017-09-08: qty 3

## 2017-09-08 MED ORDER — ALBUTEROL SULFATE (2.5 MG/3ML) 0.083% IN NEBU: 2.5000 mg | INHALATION_SOLUTION | RESPIRATORY_TRACT | Status: DC | PRN

## 2017-09-08 MED ORDER — PREDNISONE 20 MG PO TABS: ORAL_TABLET | ORAL | 0 refills | Status: AC

## 2017-09-08 MED ORDER — HYDROMORPHONE HCL 2 MG PO TABS: 1.0000 mg | ORAL_TABLET | Freq: Three times a day (TID) | ORAL | 0 refills | Status: AC

## 2017-09-08 MED ORDER — DIAZEPAM 5 MG PO TABS: 5.0000 mg | ORAL_TABLET | Freq: Four times a day (QID) | ORAL | 0 refills | Status: AC | PRN

## 2017-09-08 MED ORDER — WARFARIN SODIUM 1 MG PO TABS
1.0000 mg | ORAL_TABLET | Freq: Once | ORAL | Status: DC
  Filled 2017-09-08: qty 1

## 2017-09-08 MED ORDER — HYDROXYZINE HCL 25 MG PO TABS: 25.0000 mg | ORAL_TABLET | Freq: Three times a day (TID) | ORAL | 0 refills | Status: AC | PRN

## 2017-09-08 MED ORDER — WARFARIN SODIUM 1 MG PO TABS
1.0000 mg | ORAL_TABLET | Freq: Once | ORAL | Status: AC
  Administered 2017-09-08: 1 mg via ORAL
  Filled 2017-09-08 (×2): qty 1

## 2017-09-08 NOTE — Progress Notes (Signed)
ANTICOAGULATION CONSULT NOTE - Follow Up Consult  Pharmacy Consult for Warfarin Indication: atrial fibrillation  Allergies  Allergen Reactions  . Adhesive [Tape] Rash    Please use paper tape  . Amoxicillin Rash    migraine  . Ativan [Lorazepam] Anxiety  . Bupropion Anxiety  . Diphenhydramine Itching and Anxiety    Only with IV doses. Tolerates oral.  . Penicillins Other (See Comments)    MIGRAINE  Has patient had a PCN reaction causing immediate rash, facial/tongue/throat swelling, SOB or lightheadedness with hypotension: no Has patient had a PCN reaction causing severe rash involving mucus membranes or skin necrosis: No Has patient had a PCN reaction that required hospitalization No Has patient had a PCN reaction occurring within the last 10 years: No If all of the above answers are "NO", then may proceed with Cephalosporin use.    Patient Measurements: Height: 5\' 10"  (177.8 cm) Weight: 151 lb 0.2 oz (68.5 kg) IBW/kg (Calculated) : 73 Heparin Dosing Weight: n/a  Vital Signs: Temp: 98.7 F (37.1 C) (01/03 0711) Temp Source: Oral (01/03 0711) BP: 102/80 (01/03 0311) Pulse Rate: 115 (01/03 0311)  Labs: Recent Labs    09/06/17 0134 09/07/17 0338 09/07/17 0730 09/08/17 0222  HGB  --   --  11.0*  --   HCT  --   --  35.3*  --   PLT  --   --  93*  --   LABPROT 31.3* 34.7*  --  30.9*  INR 3.05 3.48  --  3.00  CREATININE  --   --  4.95*  --    Estimated Creatinine Clearance: 15 mL/min (A) (by C-G formula based on SCr of 4.95 mg/dL (H)).  Assessment: 61yom continues on warfarin for afib. INR jumped to 3.8 12/29 following Levaquin initiation. INR has fluctuated 2/2 to this interaction. The primary team is planning for one more day of Levaquin for COPD exacerbation. Warfarin held yesterday for supratherapeutic level, now trending back to the upper limit of therapeutic goal.   Patient has multiple drug-drug interactions including Levaquin and amiodarone. CBC stable, no  bleeding noted  Today INR therapeutic 3.00  Home warfarin dose = 3.75mg  daily  Goal of Therapy:  INR 2-3 Monitor platelets by anticoagulation protocol: Yes   Plan:  1) Warfarin 1mg  x1 tonight 2) Daily INR 3) Monitor for s/sx of bleeding  Georga Bora, PharmD Clinical Pharmacist 09/08/2017 11:03 AM

## 2017-09-08 NOTE — Progress Notes (Signed)
Patient discharged.  Discharge instructions provided to patient and wife at bedside.  Family sent home with copy of discharge instructions, prescriptions, and DNR form.  Patient and wife educated on medications to take and when to call doctor or 911.  Patient wheeled to car and assisted with seatbelt.  Belongings were gathered by patient's wife and taken home.

## 2017-09-08 NOTE — Care Management Note (Addendum)
Case Management Note Previous note created by Tomi Bamberger  Patient Details  Name: [name] MRN: [id number] Date of Birth: 10/09/1955  Subjective/Objective:    From home with wife, pta indep,  ESRD,HD patient M-W-F.  He has PCP and medication coverage.  Presents with HTN, aflutter,  He has been having hypotension and dialysis recently. The symptoms seem to have worsened after he had his most recent AV fistula placed. He does not use any assistive devices at home.  12/7 [place], BSN- inflamed fistula, resp distress, afib, on amio, coumadin, monitoring heart rate, HD patient M W F, also for dialysis today.                  Action/Plan: NCM will follow for dc needs.  Expected Discharge Date:                  Expected Discharge Plan:  [place]  In-House Referral:     Discharge planning Services  CM Consult  Post Acute Care Choice:    Choice offered to:     DME Arranged:    DME Agency:     HH Arranged:  ([place] 09/08/17) [place]:  [place]  Status of Service:  In process, will continue to follow  If discussed at Long Length of Stay Meetings, dates discussed:    Additional Comments: 09/08/2017 Pt to discharge home with wife via private vehicle.  Pt confirmed he has oxyimizer to take home with him in the room, pt states he was already active with [facility] for home oxygen - pt states he has portable tank to get him home [facility] confirmed pt has oxygen equipment in the home supplied by agency and that oxymizer can use portable tank for transport home).  Pt informed that he has been deemed appropriate for [facility] program with [facility] per LOS meeting 09/08/17 - pt is in agreement.  [facility] informed pt will discharge home today . CM also provided referral to [facility] with [facility] as requested - CM explained that agency may or may not accept pt post home assessment - pt stated he understood.   Per bedside nurse and pt/wife pt  ambulated independently in room however pt has been hospitalized for 7 days and therefore PT eval ordered Maryclare Labrador, RN 09/08/2017, 4:35 PM

## 2017-09-08 NOTE — Progress Notes (Signed)
Subjective:  Patient seen sitting comfortably in bed this AM in no acute distress. His wife was present bedside during rounds. He states that he continues to feel well and thinks he is ready to go home today.  Objective:  Vital signs in last 24 hours: Vitals:   09/08/17 0311 09/08/17 0442 09/08/17 0711 09/08/17 0834  BP: 102/80     Pulse: (!) 115     Resp: 20     Temp: 98.9 F (37.2 C)  98.7 F (37.1 C)   TempSrc: Oral  Oral   SpO2: 95% 94%  94%  Weight:      Height:       Physical Exam  Constitutional:  Chronically sick appearing man sitting up in bed with [place] in place in no acute distress.  HENT:  Mouth/Throat: Oropharynx is clear and moist.  Cardiovascular: Normal rate, regular rhythm and intact distal pulses. Exam reveals no friction rub.  No murmur heard. Respiratory:  Patient barrel chested with HFNC in place. Speaking in full sentences. No accessory muscle use or nasal flaring. Intermittent crackles without obvious wheezing.   Musculoskeletal: He exhibits no edema (of bilateral lower extremities) or tenderness (of bilateral lower extremities).  Skin:  Hyperpigmentation of bilateral lower extremities consistent with chronic venous stasis. No overlying erythema, warmth, or ulcerations.    Assessment/Plan:  Principal Problem:   Acute on chronic respiratory failure with hypoxia and hypercapnia (HCC) Active Problems:   ESRD on dialysis ([place])   Chronic diastolic congestive heart failure (HCC)   COPD exacerbation (HCC)   Human metapneumovirus (hMPV) pneumonia  Mr. Boze is a 62 yo M with a history of Multiple Sclerosis, CAD (s/p CABGx2), A. Fib/flutter, Hypotension, OSA, COPD, CHF (EF 55-60%, 08/03/17), GERD, and thrombocytopenia who presented with acute on chronic respiratory failure 2/2 COPD exacerbation, likely triggered by metapneumovirus. Shortness of breath improving, but patient continues on BiPAP. Palliative Medicine team following to assist with symptom  control.  Acute on chronic respiratory failure with hypoxia 2/2 acute COPD exacerbation, +metapneumovirus:  Patient's oxygen saturation and breathing have stabilized with use of Oxymizer. Patient had last dose of Levaquin last night (total 7 days therapy). Patient still had wheezing and intermittent crackles on exam today, but oxygen saturation remains 92-93%. Patient stable for discharge today. Appreciate assistance from case management and palliative care with this transition.  -Palliative Medicine consulted, recommendations appreciated -Duoneb q4h -Daily prednisone 60 mg x 1 week, 40 mg x 1 week thereafter, 20 mg x 1 week thereafter, and 10 mg x 1 week until follow up with pulmonologist -PO dilaudid 1mg  TID with IV dilaudid 0.5mg  q4h PRN for moderate dyspnea and IV dilaudid 1mg  q2h PRN for severe dyspnea, per recommendations from palliative -PO valium 5mg  q6h PRN for anxiety  Hypotension: Chronic and stable. -Continue home Midodrine 10mg  TID  ESRD on HD MWF: Tolerated HD yesterday and back on regular dialysis schedule. Plan for discharge today so patient can have outpatient dialysis tomorrow.  -Nephrology consulted; appreciate their assistance  A. Fib: Currently NSR. HR continues in 110s-120s, overnight. This has been happening throughout hospitalization, patient already on amiodarone for control. On warfarin, INR today is 3.00 today. -Continue warfarin per pharmacy -Continue home amiodarone 200mg  daily -Telemetry while inpatient  FEN/GI: -Renal diet with 1.2L volume restriction -No IVF, replace electrolytes as needed -Senokot for constipation  VTE Prophylaxis: Warfarin per pharmacy Code Status: DNR/DNI  Dispo: Anticipated discharge today with home hospice following.   Thomasene Ripple, MD 09/08/2017, 12:06 PM Pager:  P [phone]

## 2017-09-08 NOTE — Evaluation (Signed)
Physical Therapy Evaluation Patient Details Name: [name] MRN: [id number] DOB: 07-21-1956 Today's Date: 09/08/2017   History of Present Illness  Pt is a 62 y.o. male admitted 09/01/17 with worsening SOB and cough; found to be hypoxic and tachycardic. Worked up for acute on chronic hypoxic and hypercapnic respiratory failure due to a COPD exacerbation, likely triggered by metapneumovirus, requiring BiPAP. O2 saturations have improved with use of oximizer. Pertinent PMH includes multiple sclerosis, CAD (s/p CABG), A-fib/flutter, hypotension, ESRD on HD, CHF, COPD, GERD. Of note, recently admitted 4 wks ago s/p AV fistula placement on 12/5.    Clinical Impression  Pt presents with an overall decrease in functional mobility secondary to above. PTA, pt indep for household mobility and lives with wife who provides assist for ADLs; will have family available for 24/7 assist at home. Today, able to ambulate short distance with RW and close min guard for balance. SpO2 remained >90% on 3L O2 oximizer. Pt would benefit from continued acute PT services to maximize functional mobility and independence prior to d/c with HHPT services.     Follow Up Recommendations Home health PT;Supervision for mobility/OOB    Equipment Recommendations  None recommended by PT    Recommendations for Other Services       Precautions / Restrictions Precautions Precautions: Fall Precaution Comments: 3L O2 [place] (oximizer) Restrictions Weight Bearing Restrictions: No      Mobility  Bed Mobility Overal bed mobility: Modified Independent             General bed mobility comments: Mod indep with HOB elevated  Transfers Overall transfer level: Needs assistance Equipment used: Rolling E[place]hel (2 wheeled) Transfers: Sit to/from Stand Sit to Stand: Min guard         General transfer comment: Min guard for balance upon standing  Ambulation/Gait Ambulation/Gait assistance: Min guard Ambulation Distance  (Feet): 80 Feet Assistive device: Rolling [place] (2 wheeled) Gait Pattern/deviations: Step-through pattern;Decreased stride length;Staggering right;Staggering left Gait velocity: Decreased Gait velocity interpretation: <1.8 ft/sec, indicative of risk for recurrent falls General Gait Details: Slow, unsteady amb with RW and close min guard for balance. Intermittent cues for safety, as pt tends to speed up and get outside of RW, especially with turning  Stairs            Wheelchair Mobility    Modified Rankin (Stroke Patients Only)       Balance Overall balance assessment: Needs assistance Sitting-balance support: Feet supported Sitting balance-Leahy Scale: Fair     Standing balance support: During functional activity;Single extremity supported Standing balance-Leahy Scale: Poor Standing balance comment: Reliant on UE support to maintain balance                             Pertinent Vitals/Pain Pain Assessment: No/denies pain    Home Living Family/patient expects to be discharged to:: Private residence Living Arrangements: Spouse/significant other Available Help at Discharge: Family;Available 24 hours/day Type of Home: House Home Access: Stairs to enter Entrance Stairs-Rails: None Entrance Stairs-Number of Steps: 2 Home Layout: One level Home Equipment: Sholl - 2 wheels;Cane - single point;Grab bars - tub/shower;Shower seat - built in;Bedside commode      Prior Function Level of Independence: Needs assistance   Gait / Transfers Assistance Needed: Ambulates independently.  No DME used. Wife pushes pt in borrowed wheelchair for longer distances  ADL's / Homemaking Assistance Needed: wife assists with bathing and other ADLs as needed  Hand Dominance        Extremity/Trunk Assessment   Upper Extremity Assessment Upper Extremity Assessment: Generalized weakness    Lower Extremity Assessment Lower Extremity Assessment: Generalized  weakness       Communication   Communication: HOH  Cognition Arousal/Alertness: Awake/alert Behavior During Therapy: WFL for tasks assessed/performed Overall Cognitive Status: Impaired/Different from baseline Area of Impairment: Safety/judgement                         Safety/Judgement: Decreased awareness of safety            General Comments General comments (skin integrity, edema, etc.): Wife present during session    Exercises     Assessment/Plan    PT Assessment Patient needs continued PT services  PT Problem List Decreased strength;Decreased mobility;Decreased balance;Decreased safety awareness       PT Treatment Interventions DME instruction;Gait training;Stair training;Functional mobility training;Therapeutic activities;Therapeutic exercise;Patient/family education    PT Goals (Current goals can be found in the Care Plan section)  Acute Rehab PT Goals Patient Stated Goal: Return home PT Goal Formulation: With patient/family Time For Goal Achievement: 09/22/17 Potential to Achieve Goals: Good    Frequency Min 3X/week   Barriers to discharge        Co-evaluation               AM-PAC PT "6 Clicks" Daily Activity  Outcome Measure Difficulty turning over in bed (including adjusting bedclothes, sheets and blankets)?: None Difficulty moving from lying on back to sitting on the side of the bed? : None Difficulty sitting down on and standing up from a chair with arms (e.g., wheelchair, bedside commode, etc,.)?: A Little Help needed moving to and from a bed to chair (including a wheelchair)?: A Little Help needed walking in hospital room?: A Little Help needed climbing 3-5 steps with a railing? : A Little 6 Click Score: 20    End of Session Equipment Utilized During Treatment: Gait belt;Oxygen Activity Tolerance: Patient tolerated treatment well Patient left: in bed;with call bell/phone within reach Nurse Communication: Mobility status PT  Visit Diagnosis: Other abnormalities of gait and mobility (R26.89);Muscle weakness (generalized) (M62.81)    Time: 2633-3545 PT Time Calculation (min) (ACUTE ONLY): 23 min   Charges:   PT Evaluation $PT Eval Moderate Complexity: 1 Mod PT Treatments $Gait Training: 8-22 mins   PT G Codes:       Mabeline Caras, PT, DPT Acute Rehab Services  Pager: [place] 09/08/2017, 5:34 PM

## 2017-09-08 NOTE — Progress Notes (Signed)
Palliative Medicine RN Note: Rec'd call from [place]. Patient is planning on continuing HD. Spoke with Dr Hilma Favors. She reports that plan has never been for home hospice, and that pt will be continuing HD with a plan for outpatient palliative care. Clarified orders in Epic; will notify Dr Berneice Gandy.  Marjie Skiff Haze [place], RN, BSN, [facility] 09/08/2017 4:01 PM Office [phone]

## 2017-09-08 NOTE — Progress Notes (Signed)
Assessment/Plan: 1. Hypoxia- multifactorial- + metapneumovirus and CXR with interstitial pattern, on steroids 2. ESRD weight is down from 81.5kg to 68.5 yesterday post dialysis. will plan for HD tomorrow 3. Chronic hypotension on midodrine 10mg  tid 4. Tachycardia, will avoid intravasc vol depletion, check CXR 5. Disposition- per primary team  Subjective: Interval History: still SOB  Objective: Vital signs in last 24 hours: Temp:  [97.8 F (36.6 C)-98.9 F (37.2 C)] 98.6 F (37 C) (01/03 1256) Pulse Rate:  [111-124] 115 (01/03 0311) Resp:  [18-21] 20 (01/03 0311) BP: (93-102)/(73-89) 102/80 (01/03 0311) SpO2:  [87 %-97 %] 94 % (01/03 0834) Weight change:   Intake/Output from previous day: 01/02 0701 - 01/03 0700 In: 340 [P.O.:240; IV Piggyback:100] Out: 1700  Intake/Output this shift: No intake/output data recorded.  General appearance: alert and cooperative Resp: crackle bilat Cardio: tachycardic Extremities: edema 1+ firm  Lab Results: Recent Labs    09/07/17 0730  WBC 9.9  HGB 11.0*  HCT 35.3*  PLT 93*   BMET:  Recent Labs    09/07/17 0730  NA 138  K 4.0  CL 98*  CO2 27  GLUCOSE 128*  BUN 57*  CREATININE 4.95*  CALCIUM 8.1*   No results for input(s): PTH in the last 72 hours. Iron Studies: No results for input(s): IRON, TIBC, TRANSFERRIN, FERRITIN in the last 72 hours. Studies/Results: No results found.  Scheduled: . amiodarone  200 mg Oral Daily  . atorvastatin  20 mg Oral QHS  . calcium acetate  4,002 mg Oral TID WC  . guaiFENesin  600 mg Oral BID  . HYDROmorphone  1 mg Oral TID  . ipratropium-albuterol  3 mL Nebulization Q6H  . mouth rinse  15 mL Mouth Rinse BID  . midodrine  10 mg Oral TID  . multivitamin  1 tablet Oral QHS  . predniSONE  60 mg Oral Daily  . warfarin  1 mg Oral ONCE-1800  . Warfarin - Pharmacist Dosing Inpatient   Does not apply q1800    LOS: 7 days   [name] 09/08/2017,1:09 PM

## 2017-09-08 NOTE — Progress Notes (Signed)
Palliative Medicine RN Note: Rec'd call from [facility]. Pt is ready for d/c, and they want to be sure hospice and equipment are in place before d/c. Noted order for RNCM to set up oxymizer, but no active order for hospice at home. Placed order per discussion.  Marjie Skiff Amazin Pincock, RN, BSN, [facility] 09/08/2017 12:11 PM Office [phone]

## 2017-09-13 ENCOUNTER — Telehealth: Payer: Self-pay | Admitting: Internal Medicine

## 2017-09-13 NOTE — Telephone Encounter (Signed)
Called [facility] to initiate PA for albuterol neb.  PA initiated over the phone, submitted to clinical review, and a determination is expected within 72 hours.    [id number]   Will hold for follow-up.

## 2017-09-13 NOTE — Telephone Encounter (Signed)
Received fax from pharmacy for PA request on albuterol nebulizer solution. PA initiated through covermymeds. Key for PA is [id number]. Case ID is [id number]

## 2017-09-14 MED ORDER — ALBUTEROL SULFATE 1.25 MG/3ML IN NEBU: 1.0000 | INHALATION_SOLUTION | Freq: Four times a day (QID) | RESPIRATORY_TRACT | 1 refills | Status: AC | PRN

## 2017-09-14 NOTE — Telephone Encounter (Signed)
Checked in CCM and med was denied  Instructed to call [phone] for further information  Spoke with rep and was notified that med denied b/c it was not filed under part B medicare  I have submitted a new rx stating to file under part B to his pharm

## 2017-09-15 ENCOUNTER — Encounter: Payer: Self-pay | Admitting: Cardiovascular Disease

## 2017-09-15 ENCOUNTER — Ambulatory Visit (INDEPENDENT_AMBULATORY_CARE_PROVIDER_SITE_OTHER): Payer: Medicare Other | Admitting: Cardiovascular Disease

## 2017-09-15 ENCOUNTER — Ambulatory Visit (INDEPENDENT_AMBULATORY_CARE_PROVIDER_SITE_OTHER): Payer: Medicare Other

## 2017-09-15 VITALS — BP 86/60 | HR 115 | Ht 70.0 in | Wt 149.0 lb

## 2017-09-15 DIAGNOSIS — I959 Hypotension, unspecified: Secondary | ICD-10-CM

## 2017-09-15 DIAGNOSIS — I4892 Unspecified atrial flutter: Secondary | ICD-10-CM | POA: Diagnosis not present

## 2017-09-15 DIAGNOSIS — I481 Persistent atrial fibrillation: Secondary | ICD-10-CM | POA: Diagnosis not present

## 2017-09-15 DIAGNOSIS — I214 Non-ST elevation (NSTEMI) myocardial infarction: Secondary | ICD-10-CM

## 2017-09-15 DIAGNOSIS — Z7901 Long term (current) use of anticoagulants: Secondary | ICD-10-CM

## 2017-09-15 DIAGNOSIS — I483 Typical atrial flutter: Secondary | ICD-10-CM

## 2017-09-15 DIAGNOSIS — I4819 Other persistent atrial fibrillation: Secondary | ICD-10-CM

## 2017-09-15 DIAGNOSIS — I4891 Unspecified atrial fibrillation: Secondary | ICD-10-CM | POA: Diagnosis not present

## 2017-09-15 DIAGNOSIS — I2510 Atherosclerotic heart disease of native coronary artery without angina pectoris: Secondary | ICD-10-CM | POA: Diagnosis not present

## 2017-09-15 DIAGNOSIS — Z5181 Encounter for therapeutic drug level monitoring: Secondary | ICD-10-CM | POA: Diagnosis not present

## 2017-09-15 LAB — POCT INR: INR: 4.7

## 2017-09-15 NOTE — Patient Instructions (Signed)
Medication Instructions:  Your physician has recommended you make the following change in your medication: Stop Atorvastatin and Warfarin   Labwork: none  Testing/Procedures: none  Follow-Up: Your physician recommends that you schedule a follow-up appointment as needed.     Any Other Special Instructions Will Be Listed Below (If Applicable).     If you need a refill on your cardiac medications before your next appointment, please call your pharmacy.

## 2017-09-15 NOTE — Progress Notes (Signed)
Chief Complaint  Patient presents with  . Atrial Fibrillation     History of Present Illness: 62 yo male with history of CAD s/p 2V CABG in 2014 and subsequent PCI procedures, AVR in 2014 with bioprosthetic AVR, ESRD on HD, persistent atrial fibrillation/flutter, multiple sclerosis and sleep apnea who is here today for cardiac follow up. I met him in 2014 after a cardiac arrest during VATS procedure. He was found to have moderate multi-vessel CAD but had worsened symptoms on medical therapy and had 2 V CABG in 2014 along with AVR at [facility]. A bioprosthetic AVR was placed. He had a  NSTEMI in May 2016 at [facility] and had a Resolute DES placed in the Circumflex and a bare metal stent placed in the SVG to LAD. He was admitted to [facility] with an anterior STEMI in June 2017 secondary to acute occlusion of the SVG to LAD treated with a DES. His Plavix had been stopped prior to planned renal transplant. Admitted to [facility] July 2017 with volume overload. Echo July 2017 with normal LVEF, moderate MR, normally functioning bioprosthetic AVR. 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  He has atrial arrhythmias reported for several years, dating back to 2016. He was noted to have SVT and atrial fllutter during admission in 2016. Admitted to [facility] November 2017 and found to be in atrial fib//flutter. He was started on amiodarone and underwent cardioversion with return to sinus. He has been followed in the atrial fib clinic since then. He has continued to have issues with recurrent atrial fib/flutter. Repeat cardioversion March 2018. He had recurrent atrial fib/flutter and ablation was planned by Dr. Rayann Heman but anesthesia did not feel comfortable sedating the patient. He was referred to [facility] EP clinic and ablation was planned but he was found to have a lung cancer and this delayed his ablation. The lung mass was squamous cell carcinoma treated with 5 cycles of XRT (in September).. He was put on Toprol  and midodrine.  He had the atrial flutter ablation on 03/15/17. He was switched from coumadin to Eliquis and Plavix before d/c from [facility]. Following this he was readmitted with GI bleeding and EGD suggested esophagitis. He was discharged again from [facility] on Eliquis and ASA. Readmitted with bleeding 04/13/17 felt to be from his lung cancer. His Eliquis was stopped. He was told to call our office to discuss restarting coumadin. Echo June 2018 at [facility] showed LVEF=55%, ? Mitral stenosis, MAC, mitral regurgitation-unclear how much, moderate RV systolic dysfunction. His wife called our office 3 days ago with questions regarding his anti-coagulation, which has been on hold per [facility]. He missed an appt at [facility] on 06/10/17 with Dr. Antonieta Pert. He was not given a repeat follow up appt until December so he asked to be seen in our office. I saw him on 06/20/17 and he reported tripping and falling the am of the visit but no recurrent bleeding. Coumadin was restarted. He was seen by DR. Allred October 2018 and decision making regarding his atrial arrhythmias was deferred to [facility] EP. He has been restarted on amiodarone. Admitted to [facility] 09/01/17 with acute respiratory failure due to Metapneumovirus and COPD exacerbation.   He is here today for follow up. He is now on palliative care at home with goals for comfort. He is choosing to continue dialysis for now. His energy level is poor. His legs are weak. Breathing is ok today. The patient denies any chest pain, palpitations, lower  extremity edema, orthopnea, PND, near syncope or syncope.    Primary Care Physician: Algis Greenhouse, MD   Past Medical History:  Diagnosis Date  . Acute blood loss anemia    a. 02/2016 due to groin hematoma. Rec 3 U PRBC.  Marland Kitchen Anemia   . Anxiety   . Aortic heart valve prolapse 04/2013   a. s/p bioprosthetic AVR at time of CABG.  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 Bifascicular block   . CAD (coronary artery disease) 04/2013    a. 04/2013: s/p CABG x 2 (Y SVG -LAD & D2). b. 01/2015: NSTEMI s/p DES to LCx, BMS to SVG-LAD. c. NSTEMI 05/2015 during AF/AFL - non-flow limiting FFR. d. Low risk nuc 3/17. e. STEMI 6/17 after coming off Plavix, s/p DES to SVG-LAD into native vessel.  . Cancer ([place])    lung cancer - 5 doses of radiation, F/U in Dec. 2018  . Carotid artery disease (HCC)    a. 1-39% stenosis bilaterally in 06/2015.   Marland Kitchen CHF (congestive heart failure) ([place])   . Chronic respiratory failure ([place])   . COPD (chronic obstructive pulmonary disease) ([place])   . Dyspnea   . 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 b. He had a renal Tx from 2008 to 2011, but subsequent rejection - Gets HD in [place], [place] on MWF schedule.    Marland Kitchen GERD (gastroesophageal reflux disease)   . Hematoma    a. Large right groin hematoma after cath 02/2016 with associated ABL anemia.  . Hypertension   . Multiple sclerosis ([place])   . Myocardial infarction ([place])   . Nocturnal hypoxemia   . On home oxygen therapy    pt states he has not been wearing it  . PAF (paroxysmal atrial fibrillation) ([place])    a. h/o, placed on amiodarone 07/2016 due to recurrence.  . Paroxysmal atrial flutter ([place])    a. During 05/2015 admission - SVT, atrial flutter, and PAF.  Marland Kitchen Peripheral vascular disease ([place])    Cath in 01/2015 required 45 cm Destination Sheath  . Pneumonia   . Renal transplant failure and rejection   . 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 intolerant of bipap. Sleeps with O2 at 2 L  . SVT (supraventricular tachycardia) ([place])   . Valvular heart disease    a. 2D Echo 03/25/16: mild LVH, EF 50-55%, grade 2 Dd, AVR present with mild AI, mod MR, severely dilated LA, mildly dilated RV, mod RAE, PASP 72.    Past Surgical History:  Procedure Laterality Date  . APPENDECTOMY    . AV FISTULA PLACEMENT    . AV FISTULA PLACEMENT Left 07/19/2017   Procedure: LEFT BRACHIOCEPHALIC  ARTERIOVENOUS FISTULA CREATION;  Surgeon: Elam Dutch, MD;  Location: [place];  Service: Vascular;  Laterality: Left;  . CARDIAC CATHETERIZATION N/A 05/26/2015   Procedure: Left Heart Cath and Coronary Angiography;  Surgeon: Leonie Man, MD;  Location: [place] CV LAB;  Service: Cardiovascular;  Laterality: N/A;  . CARDIAC CATHETERIZATION N/A 05/26/2015   Procedure: Intravascular Pressure Wire/FFR Study;  Surgeon: Leonie Man, MD;  Location: [place] CV LAB;  Service: Cardiovascular;  Laterality: N/A;  . CARDIAC CATHETERIZATION N/A 02/15/2016   Procedure: Left Heart Cath and Coronary Angiography;  Surgeon: Wellington Hampshire, MD;  Location: [place] CV LAB;  Service: Cardiovascular;  Laterality: N/A;  . CARDIOVERSION N/A 07/12/2016   Procedure: CARDIOVERSION;  Surgeon: Minus Breeding, MD;  Location: [facility] ENDOSCOPY;  Service: Cardiovascular;  Laterality: N/A;  . CARDIOVERSION N/A 11/11/2016   Procedure: CARDIOVERSION;  Surgeon: Fay Records, MD;  Location: [place];  Service: Cardiovascular;  Laterality: N/A;  . CARDIOVERSION N/A 12/09/2016   Procedure: CARDIOVERSION;  Surgeon: Sanda Klein, MD;  Location: [facility] ENDOSCOPY;  Service: Cardiovascular;  Laterality: N/A;  . COLONOSCOPY    . CORONARY ANGIOPLASTY    . CORONARY ARTERY BYPASS GRAFT    . ESOPHAGOGASTRODUODENOSCOPY ENDOSCOPY    . excised squamous cells at rectum  2006  . flash     flash pulmonary edema  . HERNIA REPAIR  83/1517   umbilical hernia  . KIDNEY TRANSPLANT  08/2007  . KNEE SURGERY    . LEFT HEART CATHETERIZATION WITH CORONARY ANGIOGRAM N/A 01/09/2013   Procedure: LEFT HEART CATHETERIZATION WITH CORONARY ANGIOGRAM;  Surgeon: Burnell Blanks, MD;  Location: [facility] CATH LAB;  Service: Cardiovascular;  Laterality: N/A;  . LIGATION OF ARTERIOVENOUS  FISTULA Left 08/10/2017   Procedure: LIGATION OF ARTERIOVENOUS  FISTULA;  Surgeon: Elam Dutch, MD;  Location: [place];  Service: Vascular;  Laterality: Left;  .  PARATHYROIDECTOMY    . TEE WITHOUT CARDIOVERSION N/A 11/11/2016   Procedure: TRANSESOPHAGEAL ECHOCARDIOGRAM (TEE);  Surgeon: Fay Records, MD;  Location: [place];  Service: Cardiovascular;  Laterality: N/A;  . TEE WITHOUT CARDIOVERSION N/A 12/09/2016   Procedure: TRANSESOPHAGEAL ECHOCARDIOGRAM (TEE);  Surgeon: Sanda Klein, MD;  Location: [facility] ENDOSCOPY;  Service: Cardiovascular;  Laterality: N/A;    Current Outpatient Medications  Medication Sig Dispense Refill  . acetaminophen (TYLENOL) 500 MG tablet Take 1,000 mg every 6 (six) hours as needed by mouth for mild pain.     Marland Kitchen albuterol (ACCUNEB) 1.25 MG/3ML nebulizer solution Take 3 mLs (1.25 mg total) by nebulization every 6 (six) hours as needed for wheezing. 75 mL 1  . albuterol (PROAIR HFA) 108 (90 Base) MCG/ACT inhaler Inhale 2 puffs every 6 (six) hours as needed into the lungs for wheezing or shortness of breath.    Marland Kitchen amiodarone (PACERONE) 200 MG tablet Take 1 tablet (200 mg total) by mouth daily. 30 tablet 0  . calcium acetate (PHOSLO) 667 MG capsule Take 4,002 mg by mouth 3 (three) times daily with meals.     . citalopram (CELEXA) 20 MG tablet Take 20 mg by mouth at bedtime.     . diazepam (VALIUM) 5 MG tablet Take 1 tablet (5 mg total) by mouth every 6 (six) hours as needed for anxiety, muscle spasms or sedation. 30 tablet 0  . diphenhydrAMINE (BENADRYL) 2 % cream Apply 1 application topically daily as needed for itching.    . docusate sodium (COLACE) 100 MG capsule Take 300 mg by mouth 2 (two) times daily.     . folic acid (FOLVITE) 1 MG tablet Take 1 mg by mouth daily.     Marland Kitchen gabapentin (NEURONTIN) 300 MG capsule Take 300 mg by mouth at bedtime.     Marland Kitchen HYDROmorphone (DILAUDID) 2 MG tablet Take 0.5 tablets (1 mg total) by mouth 3 (three) times daily. 30 tablet 0  . hydrOXYzine (ATARAX/VISTARIL) 25 MG tablet Take 1 tablet (25 mg total) by mouth 3 (three) times daily as needed for itching. 30 tablet 0  . ketoconazole (NIZORAL) 2 % shampoo  Apply 1 application topically 3 (three) times a week.  99  . midodrine (PROAMATINE) 10  MG tablet Take 10 mg by mouth 3 (three) times daily.    . multivitamin (RENA-VIT) TABS tablet Take 1 tablet by mouth daily. 30 tablet 1  . nitroGLYCERIN (NITROSTAT) 0.4 MG SL tablet Place 0.4 mg under the tongue every 5 (five) minutes as needed for chest pain (max 3 doses - if no relief call MD).     Marland Kitchen oxyCODONE (ROXICODONE) 5 MG immediate release tablet Take 1 tablet (5 mg total) every 6 (six) hours as needed by mouth. 8 tablet 0  . OXYGEN 2lpm with sleep and "at home"    . oxymetazoline (AFRIN) 0.05 % nasal spray Place 1 spray into both nostrils 2 (two) times daily as needed for congestion.    . pantoprazole (PROTONIX) 40 MG tablet Take 1 tablet (40 mg total) by mouth 2 (two) times daily. (Patient taking differently: Take 40 mg by mouth daily. ) 180 tablet 3  . polyethylene glycol (MIRALAX / GLYCOLAX) packet Take 17 g daily as needed by mouth for mild constipation. Mix in 8 oz liquid and drink     . predniSONE (DELTASONE) 20 MG tablet Take 3 tablets (60 mg total) by mouth daily with breakfast for 7 days, THEN 2 tablets (40 mg total) daily with breakfast for 7 days, THEN 1 tablet (20 mg total) daily with breakfast for 7 days, THEN 0.5 tablets (10 mg total) daily with breakfast. 57 tablet 0  . STIOLTO RESPIMAT 2.5-2.5 MCG/ACT AERS INHALE 2 PUFFS BY MOUTH DAILY  11  . triamcinolone cream (KENALOG) 0.1 % Apply 1 application topically 3 (three) times daily.  2   No current facility-administered medications for this visit.     Allergies  Allergen Reactions  . Adhesive [Tape] Rash    Please use paper tape  . Amoxicillin Rash    migraine  . Ativan [Lorazepam] Anxiety  . Bupropion Anxiety  . Diphenhydramine Itching and Anxiety    Only with IV doses. Tolerates oral.  . Penicillins Other (See Comments)    MIGRAINE  Has patient had a PCN reaction causing immediate rash, facial/tongue/throat swelling, SOB or  lightheadedness with hypotension: no Has patient had a PCN reaction causing severe rash involving mucus membranes or skin necrosis: No Has patient had a PCN reaction that required hospitalization No Has patient had a PCN reaction occurring within the last 10 years: No If all of the above answers are "NO", then may proceed with Cephalosporin use.    Social History   Socioeconomic History  . Marital status: Married    Spouse name: Not on file  . Number of children: Not on file  . Years of education: Not on file  . Highest education level: Not on file  Social Needs  . Financial resource strain: Not on file  . Food insecurity - worry: Not on file  . Food insecurity - inability: Not on file  . Transportation needs - medical: Not on file  . Transportation needs - non-medical: Not on file  Occupational History  . Occupation: Disabled  Tobacco Use  . Smoking status: Former Smoker    Packs/day: 2.00    Years: 20.00    Pack years: 40.00    Types: Cigarettes    Last attempt to quit: 08/06/1998    Years since quitting: 19.1  . Smokeless tobacco: Never Used  Substance and Sexual Activity  . Alcohol use: No    Alcohol/week: 0.0 oz  . Drug use: No  . Sexual activity: Not on file  Other Topics Concern  .  Not on file  Social History Narrative   No history of premature CAD in either parents or siblings. However, his maternal grandfather and 2 maternal uncles had coronary artery disease.    Family History  Problem Relation Age of Onset  . Heart failure Mother        started in 28s  . Emphysema Mother        smoked  . Diabetes Father   . Lupus Sister   . Heart attack Maternal Grandfather   . Heart attack Maternal Uncle   . Heart attack Maternal Aunt   . Hypertension Maternal Aunt   . Stroke Neg Hx     Review of Systems:  As stated in the HPI and otherwise negative.   BP (!) 86/60   Pulse (!) 115   Ht _0  (1.778 m)   Wt 149 lb (67.6 kg)   SpO2 98%   BMI 21.38 kg/m    Physical Examination:  General: Thin, cachectic male, ill appearing. NAD  HEENT: OP clear, mucus membranes moist  SKIN: warm, dry. No rashes. Neuro: No focal deficits  Musculoskeletal: Muscle strength 5/5 all ext  Psychiatric: Mood and affect normal  Neck: No JVD, no carotid bruits, no thyromegaly, no lymphadenopathy.  Lungs:Clear bilaterally, no wheezes, rhonci, crackles Cardiovascular: Irregular irregular, Tachy. Systolic murmur noted.  Abdomen:Soft. Bowel sounds present. Non-tender.  Extremities: No lower extremity edema.   TEE April 2018: Left ventricle: The cavity size was normal. Wall thickness was   increased in a pattern of moderate LVH. There was concentric   hypertrophy. Systolic function was mildly reduced. The estimated   ejection fraction was in the range of 45% to 50%. No evidence of   thrombus. - Aortic valve: A bioprosthesis was present and functioning   normally. There was mild regurgitation. There was no significant   perivalvular regurgitation. - Mitral valve: Severely calcified annulus. Mildly thickened   leaflets . The findings are consistent with trivial stenosis.   There was severe regurgitation directed eccentrically and   posteriorly. Severe regurgitation is suggested by pulmonary vein   systolic flow reversal. - Left atrium: The atrium was mildly to moderately dilated. No   evidence of thrombus in the atrial cavity or appendage. No   spontaneous echo contrast was observed. - Right ventricle: The cavity size was mildly dilated. - Right atrium: The atrium was mildly dilated. - Atrial septum: No defect or patent foramen ovale was identified. - Tricuspid valve: No evidence of vegetation. There was   regurgitation directed eccentrically and toward the free wall.   There was moderate-severe perivalvular regurgitation.  EKG:  EKG is not ordered today. The ekg ordered today demonstrates   Recent Labs: 08/01/2017: B Natriuretic Peptide 1,426.5; TSH  8.689 08/11/2017: Magnesium 2.1 09/01/2017: ALT 18 09/07/2017: BUN 57; Creatinine, Ser 4.95; Hemoglobin 11.0; Platelets 93; Potassium 4.0; Sodium 138   Lipid Panel    Component Value Date/Time   CHOL 98 (L) 03/05/2016 1125   TRIG 143 03/05/2016 1125   HDL 32 (L) 03/05/2016 1125   CHOLHDL 3.1 03/05/2016 1125   VLDL 29 03/05/2016 1125   LDLCALC 37 03/05/2016 1125     Wt Readings from Last 3 Encounters:  09/15/17 149 lb (67.6 kg)  09/07/17 151 lb 0.2 oz (68.5 kg)  08/12/17 179 lb 3.7 oz (81.3 kg)     Other studies Reviewed: Additional studies/ records that were reviewed today include: . Review of the above records demonstrates:    Assessment and Plan:  1. Atrial fibrillation/flutter,persistent: He has failed atrial flutter ablation at [facility]. He now has chronic, persistent atrial fib/flutter. No plans for any change in therapy or repeat DCCV. He is now on palliative care. Will continue amiodarone to attempt to control his HR. He does not tolerate beta blockers. After a long discussion today with the patient and his wife, we will stop the coumadin. He is ok with this. Will alert coumadin clinic.    2. CAD without angina:  He is s/p CABG in 2014 and several PCI/stent procedures since then, most recently acute anterior MI in June 2017 secondary to thrombotic occlusion of anastomosis of the SVG to LAD, treated with a DES. His MI occurred after stopping Plavix for planning renal transplant. He has been off of ASA and Plavix due to GI bleeding. We will not restart now that he is on palliative care.     Will stop Lipitor.   3. Aortic valve disease: He is s/p bioprosthetic AVR in 2014 at [facility]. No plans for repeat imaging  4. Mitral regurgiation: His MR was severe by TEE in April 2018. He is not a candidate for any surgical procedure given his many co-morbidities.    5. Hypotension: BP is low but stable. Continue midodrine.   6. ESRD: He is on HD  Will plan to see his back as needed. He is at  the end of life and they are starting palliative care with comfort measures at home this week.   Current medicines are reviewed at length with the patient today.  The patient does not have concerns regarding medicines.  The following changes have been made:  no change  Labs/ tests ordered today include:   No orders of the defined types were placed in this encounter.   30 minutes during visit today reviewing his complex medical history with face to face time with pt and wife.   Disposition:   FU with me as needed. Jeanice Lim, MD 09/15/2017 12:59 PM    [address], [place], [place]  [address] Phone: [phone]; Fax: [phone]

## 2017-09-16 ENCOUNTER — Inpatient Hospital Stay (HOSPITAL_COMMUNITY)
Admission: EM | Admit: 2017-09-16 | Discharge: 2017-09-19 | DRG: 312 | Disposition: A | Discharge status: Hospice | Payer: Medicare Other | Attending: Oncology | Admitting: Oncology

## 2017-09-16 ENCOUNTER — Emergency Department (HOSPITAL_COMMUNITY): Payer: Medicare Other

## 2017-09-16 DIAGNOSIS — Z66 Do not resuscitate: Secondary | ICD-10-CM | POA: Diagnosis present

## 2017-09-16 DIAGNOSIS — J449 Chronic obstructive pulmonary disease, unspecified: Secondary | ICD-10-CM | POA: Diagnosis present

## 2017-09-16 DIAGNOSIS — I953 Hypotension of hemodialysis: Secondary | ICD-10-CM | POA: Diagnosis not present

## 2017-09-16 DIAGNOSIS — D631 Anemia in chronic kidney disease: Secondary | ICD-10-CM | POA: Diagnosis present

## 2017-09-16 DIAGNOSIS — E782 Mixed hyperlipidemia: Secondary | ICD-10-CM | POA: Diagnosis present

## 2017-09-16 DIAGNOSIS — D72829 Elevated white blood cell count, unspecified: Secondary | ICD-10-CM

## 2017-09-16 DIAGNOSIS — Z79899 Other long term (current) drug therapy: Secondary | ICD-10-CM

## 2017-09-16 DIAGNOSIS — D899 Disorder involving the immune mechanism, unspecified: Secondary | ICD-10-CM | POA: Diagnosis present

## 2017-09-16 DIAGNOSIS — Z85118 Personal history of other malignant neoplasm of bronchus and lung: Secondary | ICD-10-CM

## 2017-09-16 DIAGNOSIS — D649 Anemia, unspecified: Secondary | ICD-10-CM | POA: Diagnosis present

## 2017-09-16 DIAGNOSIS — K219 Gastro-esophageal reflux disease without esophagitis: Secondary | ICD-10-CM | POA: Diagnosis present

## 2017-09-16 DIAGNOSIS — I480 Paroxysmal atrial fibrillation: Secondary | ICD-10-CM | POA: Diagnosis present

## 2017-09-16 DIAGNOSIS — I252 Old myocardial infarction: Secondary | ICD-10-CM

## 2017-09-16 DIAGNOSIS — I959 Hypotension, unspecified: Secondary | ICD-10-CM

## 2017-09-16 DIAGNOSIS — G35 Multiple sclerosis: Secondary | ICD-10-CM | POA: Diagnosis present

## 2017-09-16 DIAGNOSIS — Z515 Encounter for palliative care: Secondary | ICD-10-CM | POA: Diagnosis present

## 2017-09-16 DIAGNOSIS — Z951 Presence of aortocoronary bypass graft: Secondary | ICD-10-CM

## 2017-09-16 DIAGNOSIS — D696 Thrombocytopenia, unspecified: Secondary | ICD-10-CM | POA: Diagnosis present

## 2017-09-16 DIAGNOSIS — Z87891 Personal history of nicotine dependence: Secondary | ICD-10-CM

## 2017-09-16 DIAGNOSIS — Z953 Presence of xenogenic heart valve: Secondary | ICD-10-CM

## 2017-09-16 DIAGNOSIS — R0902 Hypoxemia: Secondary | ICD-10-CM | POA: Diagnosis present

## 2017-09-16 DIAGNOSIS — I5032 Chronic diastolic (congestive) heart failure: Secondary | ICD-10-CM | POA: Diagnosis present

## 2017-09-16 DIAGNOSIS — Z8249 Family history of ischemic heart disease and other diseases of the circulatory system: Secondary | ICD-10-CM

## 2017-09-16 DIAGNOSIS — Z992 Dependence on renal dialysis: Secondary | ICD-10-CM

## 2017-09-16 DIAGNOSIS — J9611 Chronic respiratory failure with hypoxia: Secondary | ICD-10-CM | POA: Diagnosis present

## 2017-09-16 DIAGNOSIS — I509 Heart failure, unspecified: Secondary | ICD-10-CM

## 2017-09-16 DIAGNOSIS — Z9981 Dependence on supplemental oxygen: Secondary | ICD-10-CM

## 2017-09-16 DIAGNOSIS — I132 Hypertensive heart and chronic kidney disease with heart failure and with stage 5 chronic kidney disease, or end stage renal disease: Secondary | ICD-10-CM | POA: Diagnosis present

## 2017-09-16 DIAGNOSIS — N186 End stage renal disease: Secondary | ICD-10-CM

## 2017-09-16 DIAGNOSIS — I739 Peripheral vascular disease, unspecified: Secondary | ICD-10-CM | POA: Diagnosis present

## 2017-09-16 DIAGNOSIS — Z833 Family history of diabetes mellitus: Secondary | ICD-10-CM

## 2017-09-16 DIAGNOSIS — I2510 Atherosclerotic heart disease of native coronary artery without angina pectoris: Secondary | ICD-10-CM | POA: Diagnosis present

## 2017-09-16 DIAGNOSIS — I481 Persistent atrial fibrillation: Secondary | ICD-10-CM | POA: Diagnosis present

## 2017-09-16 DIAGNOSIS — I482 Chronic atrial fibrillation: Secondary | ICD-10-CM | POA: Diagnosis present

## 2017-09-16 DIAGNOSIS — R627 Adult failure to thrive: Secondary | ICD-10-CM | POA: Diagnosis present

## 2017-09-16 DIAGNOSIS — N2581 Secondary hyperparathyroidism of renal origin: Secondary | ICD-10-CM | POA: Diagnosis present

## 2017-09-16 DIAGNOSIS — G4730 Sleep apnea, unspecified: Secondary | ICD-10-CM | POA: Diagnosis present

## 2017-09-16 LAB — COMPREHENSIVE METABOLIC PANEL
ALK PHOS: 113 U/L (ref 38–126)
ALT: 38 U/L (ref 17–63)
AST: 35 U/L (ref 15–41)
Albumin: 2.6 g/dL — ABNORMAL LOW (ref 3.5–5.0)
Anion gap: 8 (ref 5–15)
BILIRUBIN TOTAL: 1.7 mg/dL — AB (ref 0.3–1.2)
BUN: 20 mg/dL (ref 6–20)
CO2: 28 mmol/L (ref 22–32)
CREATININE: 2.51 mg/dL — AB (ref 0.61–1.24)
Calcium: 8.6 mg/dL — ABNORMAL LOW (ref 8.9–10.3)
Chloride: 99 mmol/L — ABNORMAL LOW (ref 101–111)
GFR calc Af Amer: 30 mL/min — ABNORMAL LOW (ref >60)
GFR calc non Af Amer: 26 mL/min — ABNORMAL LOW (ref >60)
GLUCOSE: 78 mg/dL (ref 65–99)
Potassium: 3.3 mmol/L — ABNORMAL LOW (ref 3.5–5.1)
Sodium: 135 mmol/L (ref 135–145)
TOTAL PROTEIN: 5.4 g/dL — AB (ref 6.5–8.1)

## 2017-09-16 LAB — CBC WITH DIFFERENTIAL/PLATELET
Basophils Absolute: 0 10*3/uL (ref 0.0–0.1)
Basophils Relative: 0 %
EOS PCT: 0 %
Eosinophils Absolute: 0 10*3/uL (ref 0.0–0.7)
HEMATOCRIT: 29.9 % — AB (ref 39.0–52.0)
HEMOGLOBIN: 9.5 g/dL — AB (ref 13.0–17.0)
LYMPHS ABS: 1.1 10*3/uL (ref 0.7–4.0)
LYMPHS PCT: 5 %
MCH: 32.6 pg (ref 26.0–34.0)
MCHC: 31.8 g/dL (ref 30.0–36.0)
MCV: 102.7 fL — ABNORMAL HIGH (ref 78.0–100.0)
MONOS PCT: 3 %
Monocytes Absolute: 0.7 10*3/uL (ref 0.1–1.0)
Neutro Abs: 20.2 10*3/uL — ABNORMAL HIGH (ref 1.7–7.7)
Neutrophils Relative %: 92 %
Platelets: 93 10*3/uL — ABNORMAL LOW (ref 150–400)
RBC: 2.91 MIL/uL — AB (ref 4.22–5.81)
RDW: 17 % — ABNORMAL HIGH (ref 11.5–15.5)
WBC: 22 10*3/uL — AB (ref 4.0–10.5)

## 2017-09-16 LAB — CBG MONITORING, ED
GLUCOSE-CAPILLARY: 81 mg/dL (ref 65–99)
Glucose-Capillary: 68 mg/dL (ref 65–99)

## 2017-09-16 LAB — I-STAT CG4 LACTIC ACID, ED: Lactic Acid, Venous: 1.47 mmol/L (ref 0.5–1.9)

## 2017-09-16 MED ORDER — ONDANSETRON HCL 4 MG/2ML IJ SOLN: 4.0000 mg | Freq: Four times a day (QID) | INTRAMUSCULAR | Status: DC | PRN

## 2017-09-16 MED ORDER — GABAPENTIN 300 MG PO CAPS
300.0000 mg | ORAL_CAPSULE | Freq: Every day | ORAL | Status: DC
  Administered 2017-09-16 – 2017-09-18 (×3): 300 mg via ORAL
  Filled 2017-09-16 (×3): qty 1

## 2017-09-16 MED ORDER — HYDROXYZINE HCL 25 MG PO TABS: 25.0000 mg | ORAL_TABLET | Freq: Three times a day (TID) | ORAL | Status: DC | PRN

## 2017-09-16 MED ORDER — ALBUTEROL SULFATE (2.5 MG/3ML) 0.083% IN NEBU: 2.5000 mg | INHALATION_SOLUTION | Freq: Four times a day (QID) | RESPIRATORY_TRACT | Status: DC | PRN

## 2017-09-16 MED ORDER — ACETAMINOPHEN 500 MG PO TABS: 1000.0000 mg | ORAL_TABLET | Freq: Four times a day (QID) | ORAL | Status: DC | PRN

## 2017-09-16 MED ORDER — SODIUM CHLORIDE 0.9 % IV BOLUS (SEPSIS)
1000.0000 mL | Freq: Once | INTRAVENOUS | Status: AC
  Administered 2017-09-16: 1000 mL via INTRAVENOUS

## 2017-09-16 MED ORDER — SODIUM CHLORIDE 0.9 % IV BOLUS (SEPSIS): 500.0000 mL | Freq: Once | INTRAVENOUS | Status: DC

## 2017-09-16 MED ORDER — DOCUSATE SODIUM 100 MG PO CAPS
300.0000 mg | ORAL_CAPSULE | Freq: Two times a day (BID) | ORAL | Status: DC
  Administered 2017-09-16 – 2017-09-18 (×3): 300 mg via ORAL
  Filled 2017-09-16 (×4): qty 3

## 2017-09-16 MED ORDER — PANTOPRAZOLE SODIUM 40 MG PO TBEC
40.0000 mg | DELAYED_RELEASE_TABLET | Freq: Two times a day (BID) | ORAL | Status: DC
  Administered 2017-09-16 – 2017-09-19 (×6): 40 mg via ORAL
  Filled 2017-09-16 (×6): qty 1

## 2017-09-16 MED ORDER — OXYMETAZOLINE HCL 0.05 % NA SOLN: 1.0000 | Freq: Two times a day (BID) | NASAL | Status: DC | PRN

## 2017-09-16 MED ORDER — MIDODRINE HCL 5 MG PO TABS
10.0000 mg | ORAL_TABLET | Freq: Three times a day (TID) | ORAL | Status: DC
  Administered 2017-09-16 – 2017-09-19 (×9): 10 mg via ORAL
  Filled 2017-09-16 (×8): qty 2

## 2017-09-16 MED ORDER — HEPARIN SODIUM (PORCINE) 5000 UNIT/ML IJ SOLN: 5000.0000 [IU] | Freq: Three times a day (TID) | INTRAMUSCULAR | Status: DC

## 2017-09-16 MED ORDER — POLYETHYLENE GLYCOL 3350 17 G PO PACK
17.0000 g | PACK | Freq: Every day | ORAL | Status: DC | PRN
  Administered 2017-09-17: 17 g via ORAL
  Filled 2017-09-16: qty 1

## 2017-09-16 MED ORDER — FOLIC ACID 1 MG PO TABS
1.0000 mg | ORAL_TABLET | Freq: Every day | ORAL | Status: DC
  Administered 2017-09-16 – 2017-09-19 (×4): 1 mg via ORAL
  Filled 2017-09-16 (×4): qty 1

## 2017-09-16 MED ORDER — CITALOPRAM HYDROBROMIDE 20 MG PO TABS
20.0000 mg | ORAL_TABLET | Freq: Every day | ORAL | Status: DC
  Administered 2017-09-16 – 2017-09-18 (×3): 20 mg via ORAL
  Filled 2017-09-16 (×3): qty 1

## 2017-09-16 MED ORDER — DIAZEPAM 5 MG PO TABS
5.0000 mg | ORAL_TABLET | Freq: Four times a day (QID) | ORAL | Status: DC
  Administered 2017-09-16 – 2017-09-19 (×6): 5 mg via ORAL
  Filled 2017-09-16 (×6): qty 1

## 2017-09-16 MED ORDER — ONDANSETRON HCL 4 MG PO TABS: 4.0000 mg | ORAL_TABLET | Freq: Four times a day (QID) | ORAL | Status: DC | PRN

## 2017-09-16 MED ORDER — PREDNISONE 20 MG PO TABS
40.0000 mg | ORAL_TABLET | Freq: Every day | ORAL | Status: DC
  Administered 2017-09-16 – 2017-09-19 (×4): 40 mg via ORAL
  Filled 2017-09-16 (×4): qty 2

## 2017-09-16 MED ORDER — AMIODARONE HCL 200 MG PO TABS
200.0000 mg | ORAL_TABLET | Freq: Every day | ORAL | Status: DC
  Administered 2017-09-16 – 2017-09-19 (×4): 200 mg via ORAL
  Filled 2017-09-16 (×4): qty 1

## 2017-09-16 MED ORDER — HYDROMORPHONE HCL 2 MG PO TABS
1.0000 mg | ORAL_TABLET | Freq: Three times a day (TID) | ORAL | Status: DC
  Administered 2017-09-16 – 2017-09-18 (×4): 1 mg via ORAL
  Filled 2017-09-16 (×4): qty 1

## 2017-09-16 NOTE — ED Triage Notes (Signed)
Pt arrived via rc ems from his dialysis appointment where he began feeling weak and was noted to be hypotensive. Dialysis assessed systolic pressure, noted to be in the 60s at approximately 7:30am today, per EMS. Treatment was stopped at that time and EMS was called. Pressure increased thereafter to 92/60 for EMS. Pt is continuous oxygen at 3lpm. Hr 120, Sp02 98%. Pt has hx of COPD, CHF, Lung cancer. Pt is A&Ox4 at triage.

## 2017-09-16 NOTE — ED Provider Notes (Signed)
[facility] PROGRESSIVE CARE Provider Note   CSN: [id number] Arrival date & time: 09/16/17  1045     History   Chief Complaint Chief Complaint  Patient presents with  . Weakness    HPI [name] is a 62 y.o. male w PMHx atrial fibrillation/flutter, s/p aortic valve replacement, ESRD on dialysis MWF, COPD, CHF, sent to ED today from dialysis for episode of hypotension that occurred about 1.5 hours into dialysis.  It is reported his systolic blood pressure was in the 60s.  Treatment was stopped and pressure increased to 92/60 per EMS. Pt reports he woke up with nausea this morning, though denies any other symptoms. States nausea has subsided. Denies CP, new SOB, cough, abdominal pain, or other complaints. Wife at bedside reports pt has had decreased appetite the past few days, though he did eat a full bowl of oatmeal for breakfast. Reports they just began palliative care at home, though he is still receiving dialysis throughout the week for comfort measures. Wife also reports he was seen by his Cardiologist, Dr. Angelena Form, yesterday who stopped his coumadin, for INR of 4.  Of note, pt was recently admitted in December for COPD exacerbation and metapneumovirus pneumonia. He was discharged on 09/06/17, in a condition stable enough for home palliative care.  The history is provided by the patient and the spouse.    Past Medical History:  Diagnosis Date  . Acute blood loss anemia    a. 02/2016 due to groin hematoma. Rec 3 U PRBC.  Marland Kitchen Anemia   . Anxiety   . Aortic heart valve prolapse 04/2013   a. s/p bioprosthetic AVR at time of CABG.  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 Bifascicular block   . CAD (coronary artery disease) 04/2013   a. 04/2013: s/p CABG x 2 (Y SVG -LAD & D2). b. 01/2015: NSTEMI s/p DES to LCx, BMS to SVG-LAD. c. NSTEMI 05/2015 during AF/AFL - non-flow limiting FFR. d. Low risk nuc 3/17. e. STEMI 6/17 after coming off Plavix, s/p DES to SVG-LAD into  native vessel.  . Cancer ([place])    lung cancer - 5 doses of radiation, F/U in Dec. 2018  . Carotid artery disease (HCC)    a. 1-39% stenosis bilaterally in 06/2015.   Marland Kitchen CHF (congestive heart failure) ([place])   . Chronic respiratory failure ([place])   . COPD (chronic obstructive pulmonary disease) ([place])   . Dyspnea   . 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 b. He had a renal Tx from 2008 to 2011, but subsequent rejection - Gets HD in [place], [place] on MWF schedule.    Marland Kitchen GERD (gastroesophageal reflux disease)   . Hematoma    a. Large right groin hematoma after cath 02/2016 with associated ABL anemia.  . Hypertension   . Multiple sclerosis ([place])   . Myocardial infarction ([place])   . Nocturnal hypoxemia   . On home oxygen therapy    pt states he has not been wearing it  . PAF (paroxysmal atrial fibrillation) ([place])    a. h/o, placed on amiodarone 07/2016 due to recurrence.  . Paroxysmal atrial flutter ([place])    a. During 05/2015 admission - SVT, atrial flutter, and PAF.  Marland Kitchen Peripheral vascular disease ([place])    Cath in 01/2015 required 45 cm [place]  . Pneumonia   . Renal transplant failure and rejection   . S/P CABG x 2 04/2013  s/p CABG x 2 (Y SVG -LAD & D2);   Marland Kitchen Sleep apnea    a. intolerant of bipap. Sleeps with O2 at 2 L  . SVT (supraventricular tachycardia) ([place])   . Valvular heart disease    a. 2D Echo 03/25/16: mild LVH, EF 50-55%, grade 2 Dd, AVR present with mild AI, mod MR, severely dilated LA, mildly dilated RV, mod RAE, PASP 72.    Patient Active Problem List   Diagnosis Date Noted  . Hypotension of hemodialysis 09/16/2017  . Human metapneumovirus (hMPV) pneumonia 09/02/2017  . Pressure injury of skin 08/10/2017  . Persistent atrial fibrillation ([place])   . Palliative care by specialist   . Orthostatic hypotension 08/01/2017  . Malignant neoplasm of lung ([place]) 04/21/2017  . Lung nodule < 6cm on CT 02/24/2017  . Acute pulmonary  edema ([place]) 12/06/2016  . CAP (community acquired pneumonia) 11/15/2016  . Acute on chronic respiratory failure with hypoxia and hypercapnia ([place]) 11/15/2016  . COPD exacerbation ([place]) 11/15/2016  . Long term current use of amiodarone 09/28/2016  . Mixed hyperlipidemia 07/12/2016  . Restless leg syndrome 07/12/2016  . CHF (congestive heart failure) ([place]) 07/09/2016  . Coronary artery disease 07/09/2016  . Acute CHF ([place]) 07/09/2016  . Gastrointestinal hemorrhage 06/16/2016  . COPD GOLD III  03/24/2016  . Elevated troponin 03/24/2016  . Chronic diastolic congestive heart failure ([place]) 03/24/2016  . SOB (shortness of breath) 03/24/2016  . Chronic obstructive pulmonary disease ([place]) 03/24/2016  . Left leg swelling   . Macrocytic anemia   . Acute blood loss anemia 02/19/2016  . Groin hematoma 02/19/2016  . NSVT (nonsustained ventricular tachycardia) ([place]) 02/19/2016  . Bifascicular block   . Hypoxia   . Chronic anticoagulation-Coumadin 02/15/2016  . H/O aortic valve replacement 02/15/2016  . Hx of CABG x 2 2014 02/15/2016  . Acute ST elevation myocardial infarction (STEMI) ([place]) 02/15/2016  . Hemorrhoids 10/09/2015  . Recurrent major depressive disorder, in full remission ([place]) 10/09/2015  . Encounter for therapeutic drug monitoring 06/04/2015  . Paroxysmal atrial flutter ([place]) 06/04/2015  . Supraventricular tachycardia ([place]) 06/04/2015  . Anemia   . Recurrent coronary arteriosclerosis after percutaneous transluminal coronary angioplasty   . Typical atrial flutter ([place]) 05/21/2015  . Paroxysmal atrial fibrillation ([place]) 02/21/2015  . CAD of artery bypass graft-occluded SVG-LAD 02/15/16   . Hypertension   . Status post ablation of atrial flutter 08/10/2013  . Acute on chronic kidney failure ([place]) 05/01/2013  . Metabolic acidosis 39/76/7341  . Chronic constipation 04/20/2013  . 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 Constipation, chronic 04/20/2013  . Membranous  glomerulonephritis 04/20/2013  . Periodontal disease 04/20/2013  . Abscess of aortic root 04/19/2013  . 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 Non-ST elevation MI (NSTEMI) ([place]) 01/09/2013  . Obstructive sleep apnea syndrome 01/09/2013  . Aortic valve disease 12/15/2012  . GPC Bacteremia 11/14/2012  . Neck pain 05/18/2012  . Kidney transplant failure 02/14/2012  . Multiple sclerosis ([place]) 02/14/2012  . Thrombocytopenia ([place]) 02/12/2012  . Hypotension 02/12/2012  . Syncope and collapse 02/12/2012  . Aortic valve insufficiency 02/12/2012  . ESRD on dialysis ([place]) 02/12/2012  . Left-sided heart failure ([place]) 01/19/2011    Past Surgical History:  Procedure Laterality Date  . APPENDECTOMY    . AV FISTULA PLACEMENT    . AV FISTULA PLACEMENT Left 07/19/2017   Procedure: LEFT BRACHIOCEPHALIC ARTERIOVENOUS FISTULA CREATION;  Surgeon: Elam Dutch, MD;  Location: [facility];  Service: Vascular;  Laterality: Left;  . CARDIAC CATHETERIZATION  N/A 05/26/2015   Procedure: Left Heart Cath and Coronary Angiography;  Surgeon: Leonie Man, MD;  Location: [facility] CV LAB;  Service: Cardiovascular;  Laterality: N/A;  . CARDIAC CATHETERIZATION N/A 05/26/2015   Procedure: Intravascular Pressure Wire/FFR Study;  Surgeon: Leonie Man, MD;  Location: [facility] LAB;  Service: Cardiovascular;  Laterality: N/A;  . CARDIAC CATHETERIZATION N/A 02/15/2016   Procedure: Left Heart Cath and Coronary Angiography;  Surgeon: Wellington Hampshire, MD;  Location: [facility] CV LAB;  Service: Cardiovascular;  Laterality: N/A;  . CARDIOVERSION N/A 07/12/2016   Procedure: CARDIOVERSION;  Surgeon: Minus Breeding, MD;  Location[facility] ENDOSCOPY;  Service: Cardiovascular;  Laterality: N/A;  . CARDIOVERSION N/A 11/11/2016   Procedure: CARDIOVERSION;  Surgeon: Fay Records, MD;  Location: [facility];  Service: Cardiovascular;  Laterality: N/A;  . CARDIOVERSION N/A 12/09/2016   Procedure: CARDIOVERSION;  Surgeon: Sanda Klein, MD;  Location: [facility] ENDOSCOPY;  Service: Cardiovascular;  Laterality: N/A;  . COLONOSCOPY    . CORONARY ANGIOPLASTY    . CORONARY ARTERY BYPASS GRAFT    . ESOPHAGOGASTRODUODENOSCOPY ENDOSCOPY    . excised squamous cells at rectum  2006  . flash     flash pulmonary edema  . HERNIA REPAIR  [id number]/[id number]   umbilical hernia  . KIDNEY TRANSPLANT  08/2007  . KNEE SURGERY    . LEFT HEART CATHETERIZATION WITH CORONARY ANGIOGRAM N/A 01/09/2013   Procedure: LEFT HEART CATHETERIZATION WITH CORONARY ANGIOGRAM;  Surgeon: Burnell Blanks, MD;  Location[facility] C[facility] LAB;  Service: Cardiovascular;  Laterality: N/A;  . LIGATION OF ARTERIOVENOUS  FISTULA Left 08/10/2017   Procedure: LIGATION OF ARTERIOVENOUS  FISTULA;  Surgeon: Elam Dutch, MD;  Location: [facility];  Service: Vascular;  Laterality: Left;  . PARATHYROIDECTOMY    . TEE WITHOUT CARDIOVERSION N/A 11/11/2016   Procedure: TRANSESOPHAGEAL ECHOCARDIOGRAM (TEE);  Surgeon: Fay Records, MD;  Location: [facility];  Service: Cardiovascular;  Laterality: N/A;  . TEE WITHOUT CARDIOVERSION N/A 12/09/2016   Procedure: TRANSESOPHAGEAL ECHOCARDIOGRAM (TEE);  Surgeon: Sanda Klein, MD;  Location: [facility] ENDOSCOPY;  Service: Cardiovascular;  Laterality: N/A;       Home Medications    Prior to Admission medications   Medication Sig Start Date End Date Taking? Authorizing Provider  acetaminophen (TYLENOL) 500 MG tablet Take 1,000 mg every 6 (six) hours as needed by mouth for mild pain.    Yes [provider]  albuterol (ACCUNEB) 1.25 MG/3ML nebulizer solution Take 3 mLs (1.25 mg total) by nebulization every 6 (six) hours as needed for wheezing. 09/14/17  Yes Tanda Rockers, MD  albuterol (PROAIR HFA) 108 (90 Base) MCG/ACT inhaler Inhale 2 puffs every 6 (six) hours as needed into the lungs for wheezing or shortness of breath. 07/19/17  Yes Rhyne, Hulen Shouts, PA-C  amiodarone (PACERONE) 200 MG tablet Take 1 tablet (200 mg total) by mouth daily.  08/13/17  Yes Katherine Roan, MD  calcium acetate (PHOSLO) 667 MG capsule Take 2,001-4,002 mg by mouth 3 (three) times daily with meals.    Yes [provider]  citalopram (CELEXA) 20 MG tablet Take 20 mg by mouth at bedtime.    Yes [provider]  diazepam (VALIUM) 5 MG tablet Take 1 tablet (5 mg total) by mouth every 6 (six) hours as needed for anxiety, muscle spasms or sedation. Patient taking differently: Take 5 mg by mouth every 6 (six) hours.  09/08/17  Yes Nedrud, Larena Glassman, MD  docusate sodium (COLACE) 100 MG  capsule Take 300 mg by mouth 2 (two) times daily.    Yes [provider]  folic acid (FOLVITE) 1 MG tablet Take 1 mg by mouth daily.    Yes [provider]  gabapentin (NEURONTIN) 300 MG capsule Take 300 mg by mouth at bedtime.    Yes [provider]  HYDROmorphone (DILAUDID) 2 MG tablet Take 0.5 tablets (1 mg total) by mouth 3 (three) times daily. 09/08/17  Yes Thomasene Ripple, MD  hydrOXYzine (ATARAX/VISTARIL) 25 MG tablet Take 1 tablet (25 mg total) by mouth 3 (three) times daily as needed for itching. Patient taking differently: Take 25 mg by mouth 3 (three) times daily.  09/08/17  Yes Nedrud, Larena Glassman, MD  ketoconazole (NIZORAL) 2 % shampoo Apply 1 application topically 3 (three) times a week. 08/23/17  Yes [provider]  midodrine (PROAMATINE) 10 MG tablet Take 10 mg by mouth 3 (three) times daily.   Yes [provider]  multivitamin (RENA-VIT) TABS tablet Take 1 tablet by mouth daily. 01/11/13  Yes Samella Parr, NP  nitroGLYCERIN (NITROSTAT) 0.4 MG SL tablet Place 0.4 mg under the tongue every 5 (five) minutes as needed for chest pain (max 3 doses - if no relief call MD).    Yes [provider]  OXYGEN Inhale 3 L into the lungs continuous.    Yes [provider]  oxymetazoline (AFRIN) 0.05 % nasal spray Place 1 spray into both nostrils 2 (two) times daily as needed for congestion.   Yes [provider]  pantoprazole (PROTONIX) 40 MG tablet Take 1 tablet (40 mg total) by mouth 2 (two) times daily. Patient taking differently: Take 40 mg by mouth daily.  02/09/17  Yes Burnell Blanks, MD  polyethylene glycol (MIRALAX / GLYCOLAX) packet Take 17 g daily as needed by mouth for mild constipation. Mix in 8 oz liquid and drink    Yes [provider]  predniSONE (DELTASONE) 20 MG tablet Take 3 tablets (60 mg total) by mouth daily with breakfast for 7 days, THEN 2 tablets (40 mg total) daily with breakfast for 7 days, THEN 1 tablet (20 mg total) daily with breakfast for 7 days, THEN 0.5 tablets (10 mg total) daily with breakfast. 09/08/17 10/29/17 Yes Nedrud, Larena Glassman, MD  STIOLTO RESPIMAT 2.5-2.5 MCG/ACT AERS INHALE 2 PUFFS BY MOUTH DAILY 07/06/17  Yes [provider]  triamcinolone cream (KENALOG) 0.1 % Apply 1 application topically daily.  08/23/17  Yes [provider]  oxyCODONE (ROXICODONE) 5 MG immediate release tablet Take 1 tablet (5 mg total) every 6 (six) hours as needed by mouth. Patient not taking: Reported on 09/16/2017 07/19/17   Gabriel Earing, PA-C    Family History Family History  Problem Relation Age of Onset  . Heart failure Mother        started in 27s  . Emphysema Mother        smoked  . Diabetes Father   . Lupus Sister   . Heart attack Maternal Grandfather   . Heart attack Maternal Uncle   . Heart attack Maternal Aunt   . Hypertension Maternal Aunt   . Stroke Neg Hx     Social History Social History   Tobacco Use  . Smoking status: Former Smoker    Packs/day: 2.00    Years: 20.00    Pack years: 40.00    Types: Cigarettes    Last attempt to quit: 08/06/1998    Years since quitting: 19.1  . Smokeless  tobacco: Never Used  Substance Use Topics  . Alcohol use: No    Alcohol/week: 0.0 oz  . Drug use: No     Allergies   Adhesive [tape]; Amoxicillin; Ativan [lorazepam]; Bupropion; Diphenhydramine; and  Penicillins   Review of Systems Review of Systems  Constitutional: Positive for appetite change. Negative for fever.  Respiratory: Negative for cough and shortness of breath.   Cardiovascular: Negative for chest pain.  Gastrointestinal: Positive for nausea. Negative for abdominal pain and vomiting.  Allergic/Immunologic: Positive for immunocompromised state.  Hematological: Bruises/bleeds easily.  All other systems reviewed and are negative.    Physical Exam Updated Vital Signs BP (!) 73/50 (BP Location: Right Arm)   Pulse (!) 119   Temp 98.2 F (36.8 C) (Oral)   Resp 14   Ht 5\' 10"  (1.778 m)   Wt 63 kg (139 lb)   SpO2 96%   BMI 19.94 kg/m   Physical Exam  Constitutional: He is oriented to person, place, and time. He appears well-developed and well-nourished. No distress.  Cachectic, chronically ill-appearing, male, sleepy, pale  HENT:  Head: Normocephalic and atraumatic.  Eyes: Conjunctivae and EOM are normal. Pupils are equal, round, and reactive to light.  Neck: Normal range of motion. Neck supple.  Cardiovascular: Regular rhythm and intact distal pulses. Exam reveals no friction rub.  Murmur (systolic) heard. Tachycardic,   Pulmonary/Chest: Effort normal. No respiratory distress.  Dec breath sounds b/l lower lungs.  Abdominal: Soft. Bowel sounds are normal. He exhibits distension. There is no tenderness. There is no rebound and no guarding.  Musculoskeletal:  1+ pitting edema bilateral proximal legs. Left lower leg appears purple in color (wife reports this chronic).   Neurological: He is alert and oriented to person, place, and time.  Skin: Skin is warm.  Psychiatric: He has a normal mood and affect. His behavior is normal.  Nursing note and vitals reviewed.    ED Treatments / Results  Labs (all labs ordered are listed, but only abnormal results are displayed) Labs Reviewed  CBC WITH DIFFERENTIAL/PLATELET - Abnormal; Notable for the following components:       Result Value   WBC 22.0 (*)    RBC 2.91 (*)    Hemoglobin 9.5 (*)    HCT 29.9 (*)    MCV 102.7 (*)    RDW 17.0 (*)    Platelets 93 (*)    Neutro Abs 20.2 (*)    All other components within normal limits  COMPREHENSIVE METABOLIC PANEL - Abnormal; Notable for the following components:   Potassium 3.3 (*)    Chloride 99 (*)    Creatinine, Ser 2.51 (*)    Calcium 8.6 (*)    Total Protein 5.4 (*)    Albumin 2.6 (*)    Total Bilirubin 1.7 (*)    GFR calc non Af Amer 26 (*)    GFR calc Af Amer 30 (*)    All other components within normal limits  CULTURE, BLOOD (ROUTINE X 2)  CULTURE, BLOOD (ROUTINE X 2)  RENAL FUNCTION PANEL  CBG MONITORING, ED  I-STAT CG4 LACTIC ACID, ED  CBG MONITORING, ED    EKG  EKG Interpretation None       Radiology Dg Chest 2 View  Result Date: 09/16/2017 CLINICAL DATA:  Drop in blood pressure during dialysis. EXAM: CHEST  2 VIEW COMPARISON:  September 08, 2017 FINDINGS: A dialysis catheter is stable. No pneumothorax. Cardiomegaly. The hila and mediastinum are unchanged. Mild edema. Probable tiny effusions with  underlying atelectasis. An air filled mildly distended stomach is seen under the left hemidiaphragm. No other acute abnormalities. IMPRESSION: 1. Mild edema.  Cardiomegaly.  Small effusions. 2. Air-filled distended stomach under the left hemidiaphragm. Electronically Signed   By: Dorise Bullion III M.D   On: 09/16/2017 11:51    Procedures Procedures (including critical care time)  Medications Ordered in ED Medications  acetaminophen (TYLENOL) tablet 1,000 mg (not administered)  albuterol (PROVENTIL) (2.5 MG/3ML) 0.083% nebulizer solution 2.5 mg (not administered)  amiodarone (PACERONE) tablet 200 mg (200 mg Oral Given 09/16/17 1850)  citalopram (CELEXA) tablet 20 mg (not administered)  diazepam (VALIUM) tablet 5 mg (5 mg Oral Given 09/16/17 1810)  docusate sodium (COLACE) capsule 300 mg (not administered)  folic acid (FOLVITE) tablet 1 mg (1  mg Oral Given 09/16/17 1850)  gabapentin (NEURONTIN) capsule 300 mg (not administered)  HYDROmorphone (DILAUDID) tablet 1 mg (1 mg Oral Given 09/16/17 1810)  hydrOXYzine (ATARAX/VISTARIL) tablet 25 mg (not administered)  midodrine (PROAMATINE) tablet 10 mg (10 mg Oral Given 09/16/17 1850)  pantoprazole (PROTONIX) EC tablet 40 mg (not administered)  oxymetazoline (AFRIN) 0.05 % nasal spray 1 spray (not administered)  polyethylene glycol (MIRALAX / GLYCOLAX) packet 17 g (not administered)  predniSONE (DELTASONE) tablet 40 mg (40 mg Oral Given 09/16/17 1850)  ondansetron (ZOFRAN) tablet 4 mg (not administered)    Or  ondansetron (ZOFRAN) injection 4 mg (not administered)  sodium chloride 0.9 % bolus 1,000 mL (0 mLs Intravenous Stopped 09/16/17 1514)     Initial Impression / Assessment and Plan / ED Course  I have reviewed the triage vital signs and the nursing notes.  Pertinent labs & imaging results that were available during my care of the patient were reviewed by me and considered in my medical decision making (see chart for details).  Clinical Course as of Sep 17 1927  Fri Sep 16, 2017  1522 Dr. Danford Bad with Internal Medicine will see patient and is accepting admission.  [JR]    Clinical Course User Index [JR] Saron Tweed, [place] N, PA-C    Pt on home palliative care, presenting from dialysis today for hypotension. Pt chronically ill-appearing, cachectic. Complaining of an episode of nausea this morning. Wife reports decreased appetite. Pt hypotensive throughout ED visit, however per chart review pt noted to be hypotensive during recent visits. Afebrile. Labs revealing significant WBC elevation since 9 days ago, at 22. Mild drop in hgb. Lactic acid normal. Abdomen mildly distended without tenderness. Gentle fluids administered. Pt discussed with and seen by Dr. Ashok Cordia. Family with concerns for comfort care and requesting we do not administer significant medial treatments, due to overall health  status. Agree with this request. Will consult Internal Medicine for admission. Pt would benefit from formal palliative care consult.   Dr. Danford Bad with Internal Medicine accepting admission.  The patient appears reasonably stabilized for admission considering the current resources, flow, and capabilities available in the ED at this time, and I doubt any other [facility] requiring further screening and/or treatment in the ED prior to admission.  Final Clinical Impressions(s) / ED Diagnoses   Final diagnoses:  Hypotension, unspecified hypotension type  Leukocytosis, unspecified type    ED Discharge Orders    None       Majesty Oehlert, [place] N, PA-C 09/16/17 1954    Lajean Saver, MD 09/17/17 [phone]

## 2017-09-16 NOTE — H&P (Signed)
Date: 09/16/2017               Patient Name:  Tyran Huser MRN: [id number]  DOB: 06-11-1956 Age / Sex: 62 y.o., male   PCP: Algis Greenhouse, MD         Medical Service: Internal Medicine Teaching Service         Attending Physician: Dr. Rebeca Alert, Raynaldo Opitz, MD    First Contact: Dr. Berneice Gandy Pager: [phone]  Second Contact: Dr. Hetty Ely Pager: [phone]       After Hours (After 5p/  First Contact Pager: [phone]  weekends / holidays): Second Contact Pager: [phone]   Chief Complaint: Hypotension during HD  History of Present Illness: Lorn Butcher is a 62 year old male with an extensive past medical history including, but not limited to COPD, heart failure preserved ejection fraction, CAD status post CABG, A. fib, and ESRD on hemodialysis who presented to the ED after becoming severely hypotensive during HD treatment.  Patient's wife states that since discharge the patient has been doing well and breathing slightly better.  States that he was discharged on 3 L/min nasal cannula and has been doing well and not required any increase in oxygen therapy.  She has noticed that at night he does become hypoxic so he has now been using his BiPAP nightly.  She states that on 1/10 she was attempting to get the patient out of the house to go to a doctor's appointment when he began to complain of acute onset left thigh pain.  He subsequently fell to his bottom without loss of consciousness or hitting his head.  He was taken to his cardiologist's office where they were told it was likely muscle related.  He continues to have this left thigh pain.  He does feel like he has bilateral lower extremity weakness but otherwise feels good.  His wife states that this morning he took his Midodrine and went to dialysis.  After approximately an hour and a half before run time she received a call that he was hypotensive.  The patient initially did not want to come to the hospital however staff at the dialysis office felt  that given how low his blood pressure was it was best at the ambulance was called.  When EMS arrived his blood pressure had improved to 92/60 and he had no increased oxygen demand.  He was subsequently transported to the hospital and evaluated in the ED.  Patient's wife states that they were supposed to have their initial encounter with palliative care today.  However the patient was brought to the hospital instead.  They are requesting that we speak with [facility] [place].  Meds:  Current Meds  Medication Sig  . acetaminophen (TYLENOL) 500 MG tablet Take 1,000 mg every 6 (six) hours as needed by mouth for mild pain.   Marland Kitchen albuterol (ACCUNEB) 1.25 MG/3ML nebulizer solution Take 3 mLs (1.25 mg total) by nebulization every 6 (six) hours as needed for wheezing.  Marland Kitchen albuterol (PROAIR HFA) 108 (90 Base) MCG/ACT inhaler Inhale 2 puffs every 6 (six) hours as needed into the lungs for wheezing or shortness of breath.  Marland Kitchen amiodarone (PACERONE) 200 MG tablet Take 1 tablet (200 mg total) by mouth daily.  . calcium acetate (PHOSLO) 667 MG capsule Take 2,001-4,002 mg by mouth 3 (three) times daily with meals.   . citalopram (CELEXA) 20 MG tablet Take 20 mg by mouth at bedtime.   . diazepam (VALIUM) 5 MG tablet Take 1  tablet (5 mg total) by mouth every 6 (six) hours as needed for anxiety, muscle spasms or sedation. (Patient taking differently: Take 5 mg by mouth every 6 (six) hours. )  . docusate sodium (COLACE) 100 MG capsule Take 300 mg by mouth 2 (two) times daily.   . folic acid (FOLVITE) 1 MG tablet Take 1 mg by mouth daily.   Marland Kitchen gabapentin (NEURONTIN) 300 MG capsule Take 300 mg by mouth at bedtime.   Marland Kitchen HYDROmorphone (DILAUDID) 2 MG tablet Take 0.5 tablets (1 mg total) by mouth 3 (three) times daily.  . hydrOXYzine (ATARAX/VISTARIL) 25 MG tablet Take 1 tablet (25 mg total) by mouth 3 (three) times daily as needed for itching. (Patient taking differently: Take 25 mg by mouth 3 (three) times daily. )  .  ketoconazole (NIZORAL) 2 % shampoo Apply 1 application topically 3 (three) times a week.  . midodrine (PROAMATINE) 10 MG tablet Take 10 mg by mouth 3 (three) times daily.  . multivitamin (RENA-VIT) TABS tablet Take 1 tablet by mouth daily.  . nitroGLYCERIN (NITROSTAT) 0.4 MG SL tablet Place 0.4 mg under the tongue every 5 (five) minutes as needed for chest pain (max 3 doses - if no relief call MD).   . OXYGEN Inhale 3 L into the lungs continuous.   Marland Kitchen oxymetazoline (AFRIN) 0.05 % nasal spray Place 1 spray into both nostrils 2 (two) times daily as needed for congestion.  . pantoprazole (PROTONIX) 40 MG tablet Take 1 tablet (40 mg total) by mouth 2 (two) times daily. (Patient taking differently: Take 40 mg by mouth daily. )  . polyethylene glycol (MIRALAX / GLYCOLAX) packet Take 17 g daily as needed by mouth for mild constipation. Mix in 8 oz liquid and drink   . predniSONE (DELTASONE) 20 MG tablet Take 3 tablets (60 mg total) by mouth daily with breakfast for 7 days, THEN 2 tablets (40 mg total) daily with breakfast for 7 days, THEN 1 tablet (20 mg total) daily with breakfast for 7 days, THEN 0.5 tablets (10 mg total) daily with breakfast.  . STIOLTO RESPIMAT 2.5-2.5 MCG/ACT AERS INHALE 2 PUFFS BY MOUTH DAILY  . triamcinolone cream (KENALOG) 0.1 % Apply 1 application topically daily.    Allergies: Allergies as of 09/16/2017 - Review Complete 09/16/2017  Allergen Reaction Noted  . Adhesive [tape] Rash 02/12/2012  . Amoxicillin Rash 01/29/2016  . Ativan [lorazepam] Anxiety 09/10/2016  . Bupropion Anxiety 12/05/2016  . Diphenhydramine Itching and Anxiety 12/04/2012  . Penicillins Other (See Comments) 02/12/2012   Past Medical History:  Diagnosis Date  . Acute blood loss anemia    a. 02/2016 due to groin hematoma. Rec 3 U PRBC.  Marland Kitchen Anemia   . Anxiety   . Aortic heart valve prolapse 04/2013   a. s/p bioprosthetic AVR at time of CABG.  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  Bifascicular block   . CAD (coronary artery disease) 04/2013   a. 04/2013: s/p CABG x 2 (Y SVG -LAD & D2). b. 01/2015: NSTEMI s/p DES to LCx, BMS to SVG-LAD. c. NSTEMI 05/2015 during AF/AFL - non-flow limiting FFR. d. Low risk nuc 3/17. e. STEMI 6/17 after coming off Plavix, s/p DES to SVG-LAD into native vessel.  . Cancer ([place])    lung cancer - 5 doses of radiation, F/U in Dec. 2018  . Carotid artery disease ([facility])    a. 1-39% stenosis bilaterally in 06/2015.   Marland Kitchen CHF (congestive heart failure) ([place])   .  Chronic respiratory failure ([place])   . COPD (chronic obstructive pulmonary disease) ([place])   . Dyspnea   . 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 b. He had a renal Tx from 2008 to 2011, but subsequent rejection - Gets HD in [place], [place] on MWF schedule.    Marland Kitchen GERD (gastroesophageal reflux disease)   . Hematoma    a. Large right groin hematoma after cath 02/2016 with associated ABL anemia.  . Hypertension   . Multiple sclerosis ([place])   . Myocardial infarction ([place])   . Nocturnal hypoxemia   . On home oxygen therapy    pt states he has not been wearing it  . PAF (paroxysmal atrial fibrillation) ([place])    a. h/o, placed on amiodarone 07/2016 due to recurrence.  . Paroxysmal atrial flutter ([place])    a. During 05/2015 admission - SVT, atrial flutter, and PAF.  Marland Kitchen Peripheral vascular disease ([place])    Cath in 01/2015 required 45 cm Destination Sheath  . Pneumonia   . Renal transplant failure and rejection   . 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 intolerant of bipap. Sleeps with O2 at 2 L  . SVT (supraventricular tachycardia) ([place])   . Valvular heart disease    a. 2D Echo 03/25/16: mild LVH, EF 50-55%, grade 2 Dd, AVR present with mild AI, mod MR, severely dilated LA, mildly dilated RV, mod RAE, PASP 72.   Family History:  Mother: + HF, COPD Father: + DM, SLE Family: + CAD  Social History:  Lives with his wife Is  the use of alcohol or illicit substances Previous smoker  Review of Systems: A complete ROS was negative except as per HPI.   Physical Exam: Blood pressure (!) 77/64, pulse (!) 116, temperature 97.6 F (36.4 C), temperature source Oral, resp. rate 14, height 5\' 10"  (1.778 m), weight 139 lb (63 kg), SpO2 96 %.  General: Thin male in no acute distress HENT: Normocephalic, atraumatic, lesions noted in the anterior auricular region bilaterally  Pulm: Good air movement with no wheezing or crackles CV: Distant heart sounds  Abdomen: Active bowel sounds, soft, non-distended, no tenderness to palpation  Extremities: Trace pitting edema of the LEs, mild pitting edema in the thighs and sacrum  Skin: Warm and dry, senile purpura noted Neuro: Alert and oriented x3  CXR: personally reviewed: my interpretation is good inspiration and penetration, but poor rotation.  No bony or soft tissue abnormalities.  Increased pulmonary vascular congestion and cardiomegaly.  Assessment & Plan by Problem: Active Problems:   Hypotension of hemodialysis  Goals of care.  Multiple admissions within the past 6 months. Previous inpatient teams have done a good job working to establish goals of care with the patient and his family. He was recently discharged home with palliative services. Their initial encounter with palliative care was scheduled for today; however, the patient was brought to the ED and they are unable to have that visit. [facility] [place] was contacted and their liaison will be by to see the patient in the hospital. The hope is that they will be able to establish an outpatient palliative care plan.  - [facility] contacted - Patient is a DNR - Patient and family voice that the primary goal is still to return home with palliative services and continue HD until Mr.  Severns can no longer tolerate it. At which point they will transition to hospice care.  Hypotension. Kage Willmann is a  62 year old male with an extensive past medical history including, but not limited to COPD, heart failure preserved ejection fraction, CAD status post CABG, A. fib, and ESRD on hemodialysis who presented to the ED after becoming severely hypotensive during HD treatment. His wife states that he did take his Midodrine today. His blood pressure has improved but continues to be on the softer side. Unfortunately his Midodrine is already at the maximum recommended dose of 10 mg 3 times daily. We have consulted nephrology for their help. - Continue Midodrine 10 mg 3 times daily - Continue rhythm control of the patient's A. fib with amiodarone 200 mg daily  ESRD on HD - Patient goes to HD on MWF.   - Only ran for 1.5 hours today.   - Nephrology has been consulted and will evaluate the patient tomorrow  COPD. - Continue inhalers and prednisone taper.  A. Fib - Continue rhythm control with amiodarone 200 mg daily - Patient's warfarin stopped yesterday by his cardiologist  CAD status post CABG - Patient's atorvastatin stopped yesterday by his cardiologist  Diet: Renal VTE prophylaxis: SubQ heparin CODE STATUS: DNR  Dispo: Admit patient to Observation with expected length of stay less than 2 midnights.  Signed: Ina Homes, MD 09/16/2017, 5:23 PM  My Pager: [phone]

## 2017-09-17 ENCOUNTER — Encounter (HOSPITAL_COMMUNITY): Payer: Self-pay

## 2017-09-17 ENCOUNTER — Other Ambulatory Visit: Payer: Self-pay

## 2017-09-17 DIAGNOSIS — K219 Gastro-esophageal reflux disease without esophagitis: Secondary | ICD-10-CM | POA: Diagnosis present

## 2017-09-17 DIAGNOSIS — Z85118 Personal history of other malignant neoplasm of bronchus and lung: Secondary | ICD-10-CM | POA: Diagnosis not present

## 2017-09-17 DIAGNOSIS — Z833 Family history of diabetes mellitus: Secondary | ICD-10-CM | POA: Diagnosis not present

## 2017-09-17 DIAGNOSIS — E782 Mixed hyperlipidemia: Secondary | ICD-10-CM | POA: Diagnosis present

## 2017-09-17 DIAGNOSIS — C3490 Malignant neoplasm of unspecified part of unspecified bronchus or lung: Secondary | ICD-10-CM | POA: Diagnosis not present

## 2017-09-17 DIAGNOSIS — I132 Hypertensive heart and chronic kidney disease with heart failure and with stage 5 chronic kidney disease, or end stage renal disease: Secondary | ICD-10-CM | POA: Diagnosis present

## 2017-09-17 DIAGNOSIS — D899 Disorder involving the immune mechanism, unspecified: Secondary | ICD-10-CM | POA: Diagnosis present

## 2017-09-17 DIAGNOSIS — I5030 Unspecified diastolic (congestive) heart failure: Secondary | ICD-10-CM | POA: Diagnosis not present

## 2017-09-17 DIAGNOSIS — Z992 Dependence on renal dialysis: Secondary | ICD-10-CM | POA: Diagnosis not present

## 2017-09-17 DIAGNOSIS — G4730 Sleep apnea, unspecified: Secondary | ICD-10-CM | POA: Diagnosis present

## 2017-09-17 DIAGNOSIS — Z951 Presence of aortocoronary bypass graft: Secondary | ICD-10-CM | POA: Diagnosis not present

## 2017-09-17 DIAGNOSIS — I2510 Atherosclerotic heart disease of native coronary artery without angina pectoris: Secondary | ICD-10-CM | POA: Diagnosis present

## 2017-09-17 DIAGNOSIS — N2581 Secondary hyperparathyroidism of renal origin: Secondary | ICD-10-CM | POA: Diagnosis present

## 2017-09-17 DIAGNOSIS — I482 Chronic atrial fibrillation: Secondary | ICD-10-CM | POA: Diagnosis not present

## 2017-09-17 DIAGNOSIS — Z87891 Personal history of nicotine dependence: Secondary | ICD-10-CM | POA: Diagnosis not present

## 2017-09-17 DIAGNOSIS — Z8249 Family history of ischemic heart disease and other diseases of the circulatory system: Secondary | ICD-10-CM | POA: Diagnosis not present

## 2017-09-17 DIAGNOSIS — I953 Hypotension of hemodialysis: Secondary | ICD-10-CM | POA: Diagnosis present

## 2017-09-17 DIAGNOSIS — Z66 Do not resuscitate: Secondary | ICD-10-CM | POA: Diagnosis not present

## 2017-09-17 DIAGNOSIS — I5032 Chronic diastolic (congestive) heart failure: Secondary | ICD-10-CM | POA: Diagnosis present

## 2017-09-17 DIAGNOSIS — N186 End stage renal disease: Secondary | ICD-10-CM | POA: Diagnosis present

## 2017-09-17 DIAGNOSIS — D631 Anemia in chronic kidney disease: Secondary | ICD-10-CM | POA: Diagnosis present

## 2017-09-17 DIAGNOSIS — I252 Old myocardial infarction: Secondary | ICD-10-CM | POA: Diagnosis not present

## 2017-09-17 DIAGNOSIS — Z79899 Other long term (current) drug therapy: Secondary | ICD-10-CM | POA: Diagnosis not present

## 2017-09-17 DIAGNOSIS — I9589 Other hypotension: Secondary | ICD-10-CM | POA: Diagnosis not present

## 2017-09-17 DIAGNOSIS — J449 Chronic obstructive pulmonary disease, unspecified: Secondary | ICD-10-CM | POA: Diagnosis not present

## 2017-09-17 DIAGNOSIS — Z9981 Dependence on supplemental oxygen: Secondary | ICD-10-CM | POA: Diagnosis not present

## 2017-09-17 DIAGNOSIS — Z953 Presence of xenogenic heart valve: Secondary | ICD-10-CM | POA: Diagnosis not present

## 2017-09-17 DIAGNOSIS — J9611 Chronic respiratory failure with hypoxia: Secondary | ICD-10-CM | POA: Diagnosis present

## 2017-09-17 DIAGNOSIS — I480 Paroxysmal atrial fibrillation: Secondary | ICD-10-CM | POA: Diagnosis present

## 2017-09-17 DIAGNOSIS — I481 Persistent atrial fibrillation: Secondary | ICD-10-CM | POA: Diagnosis present

## 2017-09-17 LAB — RENAL FUNCTION PANEL
Albumin: 2.4 g/dL — ABNORMAL LOW (ref 3.5–5.0)
Anion gap: 8 (ref 5–15)
BUN: 34 mg/dL — AB (ref 6–20)
CHLORIDE: 98 mmol/L — AB (ref 101–111)
CO2: 26 mmol/L (ref 22–32)
CREATININE: 3.37 mg/dL — AB (ref 0.61–1.24)
Calcium: 8 mg/dL — ABNORMAL LOW (ref 8.9–10.3)
GFR calc Af Amer: 21 mL/min — ABNORMAL LOW (ref >60)
GFR, EST NON AFRICAN AMERICAN: 18 mL/min — AB (ref >60)
GLUCOSE: 100 mg/dL — AB (ref 65–99)
Phosphorus: 4.7 mg/dL — ABNORMAL HIGH (ref 2.5–4.6)
Potassium: 4.3 mmol/L (ref 3.5–5.1)
Sodium: 132 mmol/L — ABNORMAL LOW (ref 135–145)

## 2017-09-17 NOTE — Care Management Obs Status (Signed)
[facility] NOTIFICATION   Patient Details  Name: [name] MRN: [id number] Date of Birth: [birth year]   Medicare Observation Status Notification Given:  Yes    Arley Phenix, RN 09/17/2017, 2:29 PM

## 2017-09-17 NOTE — Progress Notes (Signed)
Subjective:  RECENT  DC 09/08/17 with multiple prob= Hypoxia/ Hypercapnia with Chronic Resp. Failure in setting of COPD  Exacerbation ,hMPV), / known Cardiac issues with A. flutter poorly controlled due to limited medication choices in view of chronic hypotension and advanced obstructive airway disease, bioprosthetic AVR, severe MR , chronic Hypotension on Midodrine. He was dc home with oxymizer in place and continuing HD with a plan for outpatient palliative care.       In  the past 6 months,has had multiple admissions with multiple discussions regarding goals of care. With recent admit = "palliative care team learned that patient wanted to continue with dialysis until physically able and will transition to hospice after discontinuation of dialysis. "  Seen by his Card. Dr Rhys Martini 09/15/17 with plan for  goals for comfort, stopped Coumadin sec inr 4 , Lipitor and  He  choose to continue dialysis . At op HD 09/16/17  continued hypotension =sent  from op HD secondary to significant hypotension  sbp in 60s 1.5  Hr into hd .   Noted  Pt. ,wife, and family this am  had discussion with Dr. Justin Mend concerning HD with plan to attempt short hd today with low temp/albumin for support on hd and if  bp drops will discontinue HD for good with hospice follow up with no further  HD .  Objective Vital signs in last 24 hours: Vitals:   09/16/17 1812 09/16/17 1858 09/16/17 2114 09/17/17 0523  BP: (!) 75/58 (!) 73/50 (!) 75/48 (!) 148/111  Pulse: (!) 119 (!) 119 (!) 119 98  Resp: 12 14 18    Temp:  98.2 F (36.8 C) 98 F (36.7 C) 98.1 F (36.7 C)  TempSrc:  Oral Oral Oral  SpO2: 98% 96% 96% 90%  Weight:      Height:       Weight change:   Physical Exam: General: thin WM  ,chroincally ill appearing awoken from sleep ,groogy thinks today is wed , NAD Heart: distant sounds Irreg, irreg, 1/6 sem  distant Lungs: nonlabored breathing  With L base crackles   Abdomen: bs pos , NT , Abd wall edema noted  Extremities: no  pedal edema   Dialysis Access: r ij perm cath    OP Dialysis: [facility] MWF 4h   68 .5 kg   Hep none   2K/3Ca bath    R IJ TDC   CXR  = mild edema/ per bed wt  Below edw here  / in ash in Wheelchair wt 74=  6 kg > edw   Problem/Plan: 1.  Hypotension =FTT with COPD/ end stage cardiac issues / on Midodrine  2. Volume overload = Attempt hd as above  / if bp does not allow As Per Dr Justin Mend = "pt will have no further hd" 3. ESRD - HD  MWF 4. Anemia - hgb 9.5 no esa as op fu trend  5. Secondary hyperparathyroidism - no vit d as op  6. FTT = palliative to see / needs fu as op again  7. CAD   SP CABG 8. Persistent A WVP:XTGG dc coumadin  09/15/17  Ov    Ernest Haber, PA-C [facility] [phone] 09/17/2017,1:53 PM  LOS: 0 days   Labs: Basic Metabolic Panel: Recent Labs  Lab 09/16/17 1114 09/17/17 0542  NA 135 132*  K 3.3* 4.3  CL 99* 98*  CO2 28 26  GLUCOSE 78 100*  BUN 20 34*  CREATININE 2.51* 3.37*  CALCIUM 8.6* 8.0*  PHOS  --  4.7*   Liver Function Tests: Recent Labs  Lab 09/16/17 1114 09/17/17 0542  AST 35  --   ALT 38  --   ALKPHOS 113  --   BILITOT 1.7*  --   PROT 5.4*  --   ALBUMIN 2.6* 2.4*   No results for input(s): LIPASE, AMYLASE in the last 168 hours. No results for input(s): AMMONIA in the last 168 hours. CBC: Recent Labs  Lab 09/16/17 1114  WBC 22.0*  NEUTROABS 20.2*  HGB 9.5*  HCT 29.9*  MCV 102.7*  PLT 93*   Cardiac Enzymes: No results for input(s): CKTOTAL, CKMB, CKMBINDEX, TROPONINI in the last 168 hours. CBG: Recent Labs  Lab 09/16/17 1150 09/16/17 1532  GLUCAP 68 81    Studies/Results: Dg Chest 2 View  Result Date: 09/16/2017 CLINICAL DATA:  Drop in blood pressure during dialysis. EXAM: CHEST  2 VIEW COMPARISON:  September 08, 2017 FINDINGS: A dialysis catheter is stable. No pneumothorax. Cardiomegaly. The hila and mediastinum are unchanged. Mild edema. Probable tiny effusions with underlying atelectasis. An air filled  mildly distended stomach is seen under the left hemidiaphragm. No other acute abnormalities. IMPRESSION: 1. Mild edema.  Cardiomegaly.  Small effusions. 2. Air-filled distended stomach under the left hemidiaphragm. Electronically Signed   By: Dorise Bullion III M.D   On: 09/16/2017 11:51   Medications:  . amiodarone  200 mg Oral Daily  . citalopram  20 mg Oral QHS  . diazepam  5 mg Oral Q6H  . docusate sodium  300 mg Oral BID  . folic acid  1 mg Oral Daily  . gabapentin  300 mg Oral QHS  . HYDROmorphone  1 mg Oral TID  . midodrine  10 mg Oral TID WC  . pantoprazole  40 mg Oral BID  . predniSONE  40 mg Oral Q breakfast

## 2017-09-17 NOTE — Progress Notes (Signed)
Subjective:  Patient seen laying comfortably in bed this AM with oxymizer in place. Patient states he was confused regarding events leading up to hospitalization and didn't understand why BP was so low with dialysis. Patient thinks that his low SBP was a fluke during dialysis because it was usually between 90-110 at home.   Objective:  Vital signs in last 24 hours: Vitals:   09/16/17 1812 09/16/17 1858 09/16/17 2114 09/17/17 0523  BP: (!) 75/58 (!) 73/50 (!) 75/48 (!) 148/111  Pulse: (!) 119 (!) 119 (!) 119 98  Resp: 12 14 18    Temp:  98.2 F (36.8 C) 98 F (36.7 C) 98.1 F (36.7 C)  TempSrc:  Oral Oral Oral  SpO2: 98% 96% 96% 90%  Weight:      Height:       Physical Exam  Constitutional:  Chronically sick man who appears older than stated age laying in bed in no acute distress with oxymizer in place.  Cardiovascular: Normal rate, regular rhythm and intact distal pulses. Exam reveals no friction rub.  No murmur heard. Respiratory:  Patient breathing comfortably on exam without signs of cyanosis. Speaking in full sentences. Intermittent soft crackles and wheezing appreciated in anterior lung fields. Poor effort with exam.  GI: Soft. He exhibits no distension. There is no tenderness.  Musculoskeletal: He exhibits no edema (of bilateral lower extremities below the knees) or tenderness (of bilateral lower extremities below the knees).  Skin: Skin is warm and dry. No rash noted. No erythema.   Assessment/Plan:  Active Problems:   Hypotension of hemodialysis  Goals of care: Patient has had multiple admissions within the past 6 months, with multiple discussions regarding goals of care. Last hospitalization palliative care team learned that patient wanted to continue with dialysis until physically able and will transition to hospice after discontinuation of dialysis. Patient was discharged with outpatient palliative care follow up to monitor progress with dialysis and facilitate  transition. Patient returned to hospital 2/2 hypotension during dialysis prior to establishing with outpatient palliative care. Patient will likely need discharge with home hospice given he is a poor candidate for outpatient dialysis (as discussed below), but nephrology will weigh in with their formal recommendations later today. -[facility] [place] contacted, will plan to see patient today  ESRD on HD MSF: Patient has hypotension yesterday during HD leading to abbreviated session prior to presenting to [facility]. Given recent episode of hypotension with HD, chronic hypotension on maximum dose of Midodrine at baseline, and other comorbid conditions, it seems that patient is a poor candidate for outpatient dialysis. Patient's family does not want to pursue facility placement, so best option for patient may be home with hospice. In brief discussions this AM patient suggests that he wants to continue with dialysis, which doesn't really seem feasible as an outpatient. While family doesn't not want placement in facility it is unclear from discussions what patient actually wants. Patient's nephrologist, Dr. Justin Mend, to see patient while hospitalized today per admission notes. His assistance with this case is greatly appreciated.  -Nephrology consulted, assistance with [place] discussions appreciated  Hypotension: Patient chronically hypotensive on Midodrine 10 mg TID prior to the current hospitalization. Throughout hospitalization BP soft in 70-90s/40-50s. Last BP 148/111, but doubt validity of this reading given historical values. Will request repeat BP. Will continue to monitor. -Continue Midodrine 10 mg 3 times daily  COPD: Patient had recent hospitalization with treatment for COPD exacerbation with IV Levaquin and steroids. Initiated on prednisone taper  on discharge. Patient stable on 3L Oxymizer. Will plan to continue taper as prescribed. -Prednisone 40 mg daily -Valium and dilaudid for air hunger  (started last admission)  Persistent A Fib: Patient chronically with HR >100 on amiodarone 200 mg daily 2/2 failed ablation and multiple drug therapies, including BB in the past. Cardiology consulted during previous admission with no further recommendations rather than continuing outpatient regimen of amiodarone 200 mg daily. Warfarin discontinued at last outpatient cardiology visit on 09/15/17, although patient unclear why this change was made.  -Continue amiodarone 200 mg daily  CAD status post CABG: Patient's atorvastatin stopped by his cardiologist during outpatient visit on 09/15/2017 given that patient is at end of life. Patient does not seem to understand why this change was made.  FEN/GI: -Renal Diet -No IVF, replace electrolytes as needed -Protonix 40 mg BID  VTE prophylaxis: SubQ heparin CODE STATUS: DNR  Dispo: Anticipated discharge in approximately 0-1 day(s).   Thomasene Ripple, MD 09/17/2017, 7:08 AM Pager: [phone]

## 2017-09-18 DIAGNOSIS — I132 Hypertensive heart and chronic kidney disease with heart failure and with stage 5 chronic kidney disease, or end stage renal disease: Secondary | ICD-10-CM

## 2017-09-18 DIAGNOSIS — C3490 Malignant neoplasm of unspecified part of unspecified bronchus or lung: Secondary | ICD-10-CM

## 2017-09-18 MED ORDER — PENTAFLUOROPROP-TETRAFLUOROETH EX AERO: 1.0000 "application " | INHALATION_SPRAY | CUTANEOUS | Status: DC | PRN

## 2017-09-18 MED ORDER — HEPARIN SODIUM (PORCINE) 1000 UNIT/ML DIALYSIS: 1000.0000 [IU] | INTRAMUSCULAR | Status: DC | PRN

## 2017-09-18 MED ORDER — SODIUM CHLORIDE 0.9 % IV SOLN: 100.0000 mL | INTRAVENOUS | Status: DC | PRN

## 2017-09-18 MED ORDER — ALTEPLASE 2 MG IJ SOLR: 2.0000 mg | Freq: Once | INTRAMUSCULAR | Status: DC | PRN

## 2017-09-18 MED ORDER — LIDOCAINE HCL (PF) 1 % IJ SOLN: 5.0000 mL | INTRAMUSCULAR | Status: DC | PRN

## 2017-09-18 MED ORDER — HEPARIN SODIUM (PORCINE) 1000 UNIT/ML DIALYSIS: 20.0000 [IU]/kg | INTRAMUSCULAR | Status: DC | PRN

## 2017-09-18 MED ORDER — MIDODRINE HCL 5 MG PO TABS
ORAL_TABLET | ORAL | Status: AC
  Filled 2017-09-18: qty 2

## 2017-09-18 MED ORDER — ALBUMIN HUMAN 25 % IV SOLN
INTRAVENOUS | Status: AC
  Administered 2017-09-18: 25 g
  Filled 2017-09-18: qty 100

## 2017-09-18 MED ORDER — SODIUM CHLORIDE 0.9 % IV BOLUS (SEPSIS): 250.0000 mL | Freq: Once | INTRAVENOUS | Status: DC | PRN

## 2017-09-18 MED ORDER — LIDOCAINE-PRILOCAINE 2.5-2.5 % EX CREA: 1.0000 "application " | TOPICAL_CREAM | CUTANEOUS | Status: DC | PRN

## 2017-09-18 NOTE — Progress Notes (Signed)
Subjective:  Patient seen sitting comfortably in bed this AM with oxymizer in place. Patient states that he had a good night overall and didn't have any trouble breathing or sleeping. No acute complaints. Patient states that his is scared about upcoming trial of dialysis session and wonders about what will happen if session doesn't go well. Tried to provide support around today's planned dialysis sessions and answer any questions regarding plan of care on AM rounds. Questions were answered to patient's and family satisfaction.   Objective:  Vital signs in last 24 hours: Vitals:   09/17/17 1718 09/17/17 2202 09/17/17 2212 09/18/17 0558  BP: (!) 70/50 (!) 73/49  (!) 77/54  Pulse:  (!) 112  (!) 113  Resp:  14  13  Temp:  (!) 97.5 F (36.4 C)  98.2 F (36.8 C)  TempSrc:  Oral  Oral  SpO2:  (!) 89% 90% 93%  Weight:      Height:       Physical Exam  Constitutional:  Chronically sick man who appears older than stated age laying in bed in no acute distress with oxymizer in place.  HENT:  Mouth/Throat: Oropharynx is clear and moist.  Cardiovascular: Normal rate, regular rhythm and intact distal pulses. Exam reveals no friction rub.  No murmur heard. Respiratory:  Patient breathing comfortably on exam without signs of cyanosis. No wheezing or crackles appreciated in anterior lung fields.  GI: Soft. He exhibits no distension. There is no tenderness.  Musculoskeletal: He exhibits no edema (of bilateral lower extremities below the knees) or tenderness (of bilateral lower extremities below the knees).  Skin: Skin is warm and dry.   Assessment/Plan:  Active Problems:   Hypotension of hemodialysis  [name] is a 62yo with PMH of COPD, Lung cancer, HFpEF, CAD status post CABG, permanent A. Fib, chronic hypotension on midodrine, and ESRD on HD MWF who presented to the ED after becoming severely hypotensive (SBP <60) during outpatient HD. Patient was admitted to the internal medicine  teaching service for management with nephrology and palliative care consulting. The specific problems addressed during admission were as follows:  ESRD on HD MSF: Patient will undergo 3 hr session of dialysis today, adding albumin and temperature changes to help support BP. If BP drops or patient does not tolerate dialysis, will discontinue dialysis and transition towards hospice care. -Nephrology consulted, assistance appreciated  Chronic hypotension: Patient chronically hypotensive on Midodrine 10 mg TID prior to the current hospitalization. Throughout hospitalization BP soft in 70-90s/40-50s. Plan for monitoring during HD today and will stop session if BP continues to drop.  -Continue Midodrine 10 mg 3 times daily   Goals of care: As discussed above, patient has multiple, complex medical conditions which have altogether contributed to his progressive decline and repeated hospitalizations. Patient and family were in pursuit of outpatient palliative care and eventual transition to hospice care if patient couldn't tolerate dialysis outside of the hosptial. The recent admission suggests outpatient dialysis no longer an option, but formal testing of this hypothesis and ongoing recommendations from nephrologist are pending. [facility] contacted on admission, but did not see patient yesterday. Given patient undergoing trial of HD today, will contact in house palliative care today to help facilitate transition to home hospice since patient's family has many questions regarding this transition. -Palliative care consulted, recommendations appreciated   COPD: Patient had recent hospitalization with treatment for COPD exacerbation with IV Levaquin and steroids. Initiated on prednisone taper on discharge that will be  continued during hospitalization.  -Prednisone 40 mg daily -Continue Oxymizer as needed  Persistent A Fib: Patient chronically with HR >100 on amiodarone 200 mg daily 2/2 failed  ablation and multiple drug therapies. Currently in 110s and asymptomatic.  -Continue amiodarone 200 mg daily  CAD status post CABG: Patient's atorvastatin stopped by his cardiologist during outpatient visit on 09/15/2017 given that patient is at end of life.  FEN/GI: -Renal Diet -No IVF, replace electrolytes as needed -Protonix 40 mg BID  VTE prophylaxis: SubQ heparin CODE STATUS: DNR  Dispo: Anticipated discharge in approximately 0-1 day(s).   Thomasene Ripple, MD 09/18/2017, 11:00 AM Pager: [phone]

## 2017-09-18 NOTE — Progress Notes (Signed)
Noted Pt in Observation  Admit / For HD today ,multiple emergent HD  cases  yesterday not able to do HD yesterday ./Resting comfortably in room ,wife present. No current sob or chest pain.  Ernest Haber, PA-C [facility] [phone] 09/18/2017,4:14 PM  LOS: 1 day   Labs: Basic Metabolic Panel: Recent Labs  Lab 09/16/17 1114 09/17/17 0542  NA 135 132*  K 3.3* 4.3  CL 99* 98*  CO2 28 26  GLUCOSE 78 100*  BUN 20 34*  CREATININE 2.51* 3.37*  CALCIUM 8.6* 8.0*  PHOS  --  4.7*   Liver Function Tests: Recent Labs  Lab 09/16/17 1114 09/17/17 0542  AST 35  --   ALT 38  --   ALKPHOS 113  --   BILITOT 1.7*  --   PROT 5.4*  --   ALBUMIN 2.6* 2.4*   CBC: Recent Labs  Lab 09/16/17 1114  WBC 22.0*  NEUTROABS 20.2*  HGB 9.5*  HCT 29.9*  MCV 102.7*  PLT 93*   Cardiac Enzymes: No results for input(s): CKTOTAL, CKMB, CKMBINDEX, TROPONINI in the last 168 hours. CBG: Recent Labs  Lab 09/16/17 1150 09/16/17 1532  GLUCAP 68 81    Studies/Results: No results found. Medications: . sodium chloride     . amiodarone  200 mg Oral Daily  . citalopram  20 mg Oral QHS  . diazepam  5 mg Oral Q6H  . docusate sodium  300 mg Oral BID  . folic acid  1 mg Oral Daily  . gabapentin  300 mg Oral QHS  . HYDROmorphone  1 mg Oral TID  . midodrine  10 mg Oral TID WC  . pantoprazole  40 mg Oral BID  . predniSONE  40 mg Oral Q breakfast

## 2017-09-18 NOTE — Discharge Summary (Signed)
Name: [name] MRN: [id number] DOB: March 13, 1956 62 y.o. PCP: Algis Greenhouse, MD  Date of Admission: 09/16/2017 10:45 AM Date of Discharge: 09/19/2017 Attending Physician: Annia Belt, MD  Discharge Diagnosis:  Principal Problem:   Hypotension of hemodialysis Active Problems:   Thrombocytopenia ([place])   ESRD on dialysis ([place])   Anemia   Hypoxia   COPD GOLD III    CHF (congestive heart failure) ([place])  Discharge Medications: Allergies as of 09/19/2017      Reactions   Adhesive [tape] Rash   Please use paper tape   Amoxicillin Rash   migraine   Ativan [lorazepam] Anxiety   Bupropion Anxiety   Diphenhydramine Itching, Anxiety   Only with IV doses. Tolerates oral.   Penicillins Other (See Comments)   MIGRAINE Has patient had a PCN reaction causing immediate rash, facial/tongue/throat swelling, SOB or lightheadedness with hypotension: no Has patient had a PCN reaction causing severe rash involving mucus membranes or skin necrosis: No Has patient had a PCN reaction that required hospitalization No Has patient had a PCN reaction occurring within the last 10 years: No If all of the above answers are "NO", then may proceed with Cephalosporin use.      Medication List    STOP taking these medications   calcium acetate 667 MG capsule Commonly known as:  PHOSLO     TAKE these medications   acetaminophen 500 MG tablet Commonly known as:  TYLENOL Take 1,000 mg every 6 (six) hours as needed by mouth for mild pain.   albuterol 108 (90 Base) MCG/ACT inhaler Commonly known as:  PROAIR HFA Inhale 2 puffs every 6 (six) hours as needed into the lungs for wheezing or shortness of breath.   albuterol 1.25 MG/3ML nebulizer solution Commonly known as:  ACCUNEB Take 3 mLs (1.25 mg total) by nebulization every 6 (six) hours as needed for wheezing.   amiodarone 200 MG tablet Commonly known as:  PACERONE Take 1 tablet (200 mg total) by mouth daily.   citalopram 20 MG  tablet Commonly known as:  CELEXA Take 20 mg by mouth at bedtime.   diazepam 5 MG tablet Commonly known as:  VALIUM Take 1 tablet (5 mg total) by mouth every 6 (six) hours as needed for anxiety, muscle spasms or sedation. What changed:  when to take this   docusate sodium 100 MG capsule Commonly known as:  COLACE Take 300 mg by mouth 2 (two) times daily.   folic acid 1 MG tablet Commonly known as:  FOLVITE Take 1 mg by mouth daily.   gabapentin 300 MG capsule Commonly known as:  NEURONTIN Take 300 mg by mouth at bedtime.   HYDROmorphone 2 MG tablet Commonly known as:  DILAUDID Take 0.5 tablets (1 mg total) by mouth 3 (three) times daily.   hydrOXYzine 25 MG tablet Commonly known as:  ATARAX/VISTARIL Take 1 tablet (25 mg total) by mouth 3 (three) times daily as needed for itching. What changed:  when to take this   ketoconazole 2 % shampoo Commonly known as:  NIZORAL Apply 1 application topically 3 (three) times a week.   midodrine 10 MG tablet Commonly known as:  PROAMATINE Take 10 mg by mouth 3 (three) times daily.   multivitamin Tabs tablet Take 1 tablet by mouth daily.   nitroGLYCERIN 0.4 MG SL tablet Commonly known as:  NITROSTAT Place 0.4 mg under the tongue every 5 (five) minutes as needed for chest pain (max 3 doses - if no relief  call MD).   oxyCODONE 5 MG immediate release tablet Commonly known as:  ROXICODONE Take 1 tablet (5 mg total) every 6 (six) hours as needed by mouth.   OXYGEN Inhale 3 L into the lungs continuous.   oxymetazoline 0.05 % nasal spray Commonly known as:  AFRIN Place 1 spray into both nostrils 2 (two) times daily as needed for congestion.   pantoprazole 40 MG tablet Commonly known as:  PROTONIX Take 1 tablet (40 mg total) by mouth 2 (two) times daily. What changed:  when to take this   polyethylene glycol packet Commonly known as:  MIRALAX / GLYCOLAX Take 17 g daily as needed by mouth for mild constipation. Mix in 8 oz  liquid and drink   predniSONE 20 MG tablet Commonly known as:  DELTASONE Take 3 tablets (60 mg total) by mouth daily with breakfast for 7 days, THEN 2 tablets (40 mg total) daily with breakfast for 7 days, THEN 1 tablet (20 mg total) daily with breakfast for 7 days, THEN 0.5 tablets (10 mg total) daily with breakfast. Start taking on:  09/08/2017   STIOLTO RESPIMAT 2.5-2.5 MCG/ACT Aers Generic drug:  Tiotropium Bromide-Olodaterol INHALE 2 PUFFS BY MOUTH DAILY   triamcinolone cream 0.1 % Commonly known as:  KENALOG Apply 1 application topically daily.       Disposition and follow-up:   CameronBrenen Weiss was discharged from [facility] in stable condition.  At the hospital follow up visit please address:  1.  [name] is a 62yo with multiple, chronic and progressive medical conditions including COPD, persistent A-Fib, chronic hypotension, and ESRD on HD MWF who presented after an episode of hypotension during dialysis. After a failed attempt of dialysis while inpatient, it was declared that the patient was no longer a candidate for dialysis and the transition towards comfort care was made on 09/19/2017. [facility] on admission to facilitate this transition. At the time of discharge they were assisting with setting up home hospice services to begin immediately upon discharge.  2.  Labs / imaging needed at time of follow-up: None  3.  Pending labs/ test needing follow-up: None  Follow-up Appointments:   Hospital Course by problem list: Principal Problem:   Hypotension of hemodialysis Active Problems:   Thrombocytopenia ([place])   ESRD on dialysis ([place])   Anemia   Hypoxia   COPD GOLD III    CHF (congestive heart failure) ([place])   [name] is a 62yo with PMH of COPD, Lung cancer, HFpEF, CAD status post CABG, permanent A. Fib, chronic hypotension on midodrine, and ESRD on HD MWF who presented to the ED after becoming severely hypotensive  (SBP <60) during outpatient HD. Patient was admitted to the internal medicine teaching service for management with nephrology and palliative care consulting. The specific problems addressed during admission were as follows:  ESRD on HD MSF: Patient's primary nephrologist Dr. Justin Mend was consulted on admission given the episode of hypotension which precipitated the patient's hospitalization. After much discussion, it was decided to attempt 3 hours of dialysis with temperature modification and administration of albumin to help support BP during sessions. On HD #2 a dialysis session was attempted, while the patient's BP did not drop during this session SBP the patient was unable to have any fluid removed and significant effort was made during the session to support his BP, making continuing dialysis unfeasible moving forward. Given this, the patient elected to move forward with home hospice as outlined  below.  Goals of care: As discussed above, patient has multiple, complex medical conditions which have altogether contributed to his progressive decline and repeated hospitalizations in the past few months. Patient and family were in pursuit of outpatient palliative care and planned an eventual transition to hospice care when patient could no longer tolerate dialysis outside of the hosptial. The recent admission suggested outpatient or inpatient dialysis was no longer an option. [facility] contacted on admission and saw patient on HD #3 after nephrology disclosed that patient will be unable to continue with dialysis. The patient desired to transfer home with hospice care at that time. The case manager and [place] made arrangements so that he would be seen in the home within 24 hours of discharge to assess further equipment and medication needs.   Chronic hypotension: Patient chronically hypotensive on Midodrine10 mg TID prior to the current hospitalization. Throughout hospitalization BP  soft but stable in the 70-80s/40-50s in spite of maximum dose of Midodrine.  COPD: Patient had recent hospitalization with treatment for COPD exacerbation with IV Levaquin and steroids. Initiated on prednisone taper on discharge on 09/08/2017 that was continued while inpatient. Patient continued use of Oxymizer, 3L [place] to maintain O2 saturations between 88-92%. Patient showed no signs of worsening respiratory status or distress during hospitalization. He was discharged without changes to oxygen regimen provided during previous hospitalization.   Persistent A Fib: Patient chronically with HR >100 on amiodarone 200 mg daily 2/2 failed ablation and multiple drug therapies, including BB. Patient historically stable on 200 mg amiodarone as an outpatient, which was maintained during hospitalization. Patient remained stable and asymptomatic while inpatient.   CAD s/p CABG: Patient's atorvastatin stopped by his cardiologist during outpatient visit on 09/15/2017 given that patient is at end of life.  Discharge Vitals:   BP (!) 89/51   Pulse (!) 113   Temp (!) 97.2 F (36.2 C) (Oral)   Resp 16   Ht 5\' 10"  (1.778 m)   Wt 139 lb 15.9 oz (63.5 kg)   SpO2 96%   BMI 20.09 kg/m   Pertinent Labs, Studies, and Procedures:   BMP Latest Ref Rng & Units 09/19/2017 09/17/2017 09/16/2017  Glucose 65 - 99 mg/dL 102(H) 100(H) 78  BUN 6 - 20 mg/dL 36(H) 34(H) 20  Creatinine 0.61 - 1.24 mg/dL 3.28(H) 3.37(H) 2.51(H)  BUN/Creat Ratio 10 - 24 - - -  Sodium 135 - 145 mmol/L 131(L) 132(L) 135  Potassium 3.5 - 5.1 mmol/L 3.9 4.3 3.3(L)  Chloride 101 - 111 mmol/L 96(L) 98(L) 99(L)  CO2 22 - 32 mmol/L 23 26 28   Calcium 8.9 - 10.3 mg/dL 6.8(L) 8.0(L) 8.6(L)   CBC Latest Ref Rng & Units 09/19/2017 09/16/2017 09/07/2017  WBC 4.0 - 10.5 K/uL 12.6(H) 22.0(H) 9.9  Hemoglobin 13.0 - 17.0 g/dL 7.3(L) 9.5(L) 11.0(L)  Hematocrit 39.0 - 52.0 % 22.4(L) 29.9(L) 35.3(L)  Platelets 150 - 400 K/uL 58(L) 93(L) 93(L)   Chest X ray, 2  view 09/16/2017: FINDINGS: A dialysis catheter is stable. No pneumothorax. Cardiomegaly. The hila and mediastinum are unchanged. Mild edema. Probable tiny effusions with underlying atelectasis. An air filled mildly distended stomach is seen under the left hemidiaphragm. No other acute abnormalities.  IMPRESSION: 1. Mild edema.  Cardiomegaly.  Small effusions. 2. Air-filled distended stomach under the left hemidiaphragm.  Discharge Instructions: Discharge Instructions    Call MD for:  severe uncontrolled pain   Complete by:  As directed    Diet - low sodium  heart healthy   Complete by:  As directed    Discharge instructions   Complete by:  As directed    [facility] will continue to work with you and your family to arrange whatever is needed for your in home care once you leave the hospital. Please reach out to them if you have any questions or concerns. They will see you within 24 hours of discharge.   Increase activity slowly   Complete by:  As directed       Signed: Thomasene Ripple, MD 09/19/2017, 12:25 PM   Pager: [phone]

## 2017-09-18 NOTE — Progress Notes (Signed)
Patient returned from HD. Called HD for confirmation and treatment in HD. Explained patient had 3hr treatment with no fluid removal, patient +500cc of fluid from HD.

## 2017-09-18 NOTE — Progress Notes (Signed)
  Date: 09/18/2017  Patient name: [name]  Medical record number: [id number]  Date of birth: Mar 22, 1956   I have seen and evaluated this patient and I have discussed the plan of care with the house staff. Please see their note for complete details. I concur with their findings with the following additions/corrections:   Attempting HD today, will decide goals of care based on how this hail mary attempt goes. Sherren Mocha and his wife Caryl Asp are understandably scared.   Oda Kilts, MD 09/18/2017, 7:25 PM

## 2017-09-19 ENCOUNTER — Telehealth: Payer: Self-pay | Admitting: Cardiovascular Disease

## 2017-09-19 DIAGNOSIS — Z992 Dependence on renal dialysis: Secondary | ICD-10-CM

## 2017-09-19 DIAGNOSIS — N186 End stage renal disease: Secondary | ICD-10-CM

## 2017-09-19 DIAGNOSIS — Z66 Do not resuscitate: Secondary | ICD-10-CM

## 2017-09-19 DIAGNOSIS — I482 Chronic atrial fibrillation: Secondary | ICD-10-CM

## 2017-09-19 DIAGNOSIS — I2510 Atherosclerotic heart disease of native coronary artery without angina pectoris: Secondary | ICD-10-CM

## 2017-09-19 DIAGNOSIS — I5030 Unspecified diastolic (congestive) heart failure: Secondary | ICD-10-CM

## 2017-09-19 DIAGNOSIS — Z951 Presence of aortocoronary bypass graft: Secondary | ICD-10-CM

## 2017-09-19 DIAGNOSIS — J449 Chronic obstructive pulmonary disease, unspecified: Secondary | ICD-10-CM

## 2017-09-19 DIAGNOSIS — I953 Hypotension of hemodialysis: Principal | ICD-10-CM

## 2017-09-19 DIAGNOSIS — Z79899 Other long term (current) drug therapy: Secondary | ICD-10-CM

## 2017-09-19 DIAGNOSIS — Z85118 Personal history of other malignant neoplasm of bronchus and lung: Secondary | ICD-10-CM

## 2017-09-19 DIAGNOSIS — I9589 Other hypotension: Secondary | ICD-10-CM

## 2017-09-19 LAB — RENAL FUNCTION PANEL
ANION GAP: 12 (ref 5–15)
Albumin: 2.7 g/dL — ABNORMAL LOW (ref 3.5–5.0)
BUN: 36 mg/dL — ABNORMAL HIGH (ref 6–20)
CALCIUM: 6.8 mg/dL — AB (ref 8.9–10.3)
CO2: 23 mmol/L (ref 22–32)
Chloride: 96 mmol/L — ABNORMAL LOW (ref 101–111)
Creatinine, Ser: 3.28 mg/dL — ABNORMAL HIGH (ref 0.61–1.24)
GFR calc Af Amer: 22 mL/min — ABNORMAL LOW (ref >60)
GFR calc non Af Amer: 19 mL/min — ABNORMAL LOW (ref >60)
GLUCOSE: 102 mg/dL — AB (ref 65–99)
POTASSIUM: 3.9 mmol/L (ref 3.5–5.1)
Phosphorus: 4.6 mg/dL (ref 2.5–4.6)
SODIUM: 131 mmol/L — AB (ref 135–145)

## 2017-09-19 LAB — CBC
HCT: 22.4 % — ABNORMAL LOW (ref 39.0–52.0)
HEMOGLOBIN: 7.3 g/dL — AB (ref 13.0–17.0)
MCH: 34.3 pg — AB (ref 26.0–34.0)
MCHC: 32.6 g/dL (ref 30.0–36.0)
MCV: 105.2 fL — ABNORMAL HIGH (ref 78.0–100.0)
Platelets: 58 10*3/uL — ABNORMAL LOW (ref 150–400)
RBC: 2.13 MIL/uL — ABNORMAL LOW (ref 4.22–5.81)
RDW: 18.2 % — ABNORMAL HIGH (ref 11.5–15.5)
WBC: 12.6 10*3/uL — ABNORMAL HIGH (ref 4.0–10.5)

## 2017-09-19 NOTE — Progress Notes (Signed)
Medicine attending: I examined this patient today together with resident physician Dr. Thomasene Ripple and I concur with her evaluation and management plan which we discussed together. Short-term readmission for this man with multiple medical issues including end-stage renal disease on chronic dialysis, persistent atrial flutter, and end-stage obstructive airway disease with chronic hypoxia and hypercarbia.  Reason for admission was hypotension at the time of dialysis on January 11. He was able to tolerate a modified dialysis session using albumin supplementation. He appears comfortable at rest this morning.  Blood pressure still low at 89/51 with lowest recorded blood pressure at 5 AM as low as 64/48.  Pulse rates in the 110-114 range.  Oxygen saturation 96% on 4 L nasal cannula.  He is afebrile. Admission EKG showed sinus tachycardia with chronic left bundle branch block pattern.  Persistent hypotension will be a limiting factor in whether or not he can continue to safely receive dialysis.  Multiple comorbid medical conditions make prognosis guarded.  Wife ever present.  Status and management plan discussed.  Ongoing input from the nephrology team.

## 2017-09-19 NOTE — Progress Notes (Signed)
[name] to be D/C'd Home with hospice per MD order.  Discussed with the patient and all questions fully answered.  IV catheter discontinued intact. Site without signs and symptoms of complications. Dressing and pressure applied.  An After Visit Summary was printed and given to the patient.   D/c education completed with patient/wife including follow up instructions, medication list, d/c activities limitations if indicated, with other d/c instructions as indicated by MD - patient able to verbalize understanding, all questions fully answered.   Patient instructed to return to ED, call 911, or call MD for any changes in condition.  Allergies as of 09/19/2017      Reactions   Adhesive [tape] Rash   Please use paper tape   Amoxicillin Rash   migraine   Ativan [lorazepam] Anxiety   Bupropion Anxiety   Diphenhydramine Itching, Anxiety   Only with IV doses. Tolerates oral.   Penicillins Other (See Comments)   MIGRAINE Has patient had a PCN reaction causing immediate rash, facial/tongue/throat swelling, SOB or lightheadedness with hypotension: no Has patient had a PCN reaction causing severe rash involving mucus membranes or skin necrosis: No Has patient had a PCN reaction that required hospitalization No Has patient had a PCN reaction occurring within the last 10 years: No If all of the above answers are "NO", then may proceed with Cephalosporin use.      Medication List    STOP taking these medications   calcium acetate 667 MG capsule Commonly known as:  PHOSLO     TAKE these medications   acetaminophen 500 MG tablet Commonly known as:  TYLENOL Take 1,000 mg every 6 (six) hours as needed by mouth for mild pain.   albuterol 108 (90 Base) MCG/ACT inhaler Commonly known as:  PROAIR HFA Inhale 2 puffs every 6 (six) hours as needed into the lungs for wheezing or shortness of breath.   albuterol 1.25 MG/3ML nebulizer solution Commonly known as:  ACCUNEB Take 3 mLs (1.25 mg  total) by nebulization every 6 (six) hours as needed for wheezing.   amiodarone 200 MG tablet Commonly known as:  PACERONE Take 1 tablet (200 mg total) by mouth daily.   citalopram 20 MG tablet Commonly known as:  CELEXA Take 20 mg by mouth at bedtime.   diazepam 5 MG tablet Commonly known as:  VALIUM Take 1 tablet (5 mg total) by mouth every 6 (six) hours as needed for anxiety, muscle spasms or sedation. What changed:  when to take this   docusate sodium 100 MG capsule Commonly known as:  COLACE Take 300 mg by mouth 2 (two) times daily.   folic acid 1 MG tablet Commonly known as:  FOLVITE Take 1 mg by mouth daily.   gabapentin 300 MG capsule Commonly known as:  NEURONTIN Take 300 mg by mouth at bedtime.   HYDROmorphone 2 MG tablet Commonly known as:  DILAUDID Take 0.5 tablets (1 mg total) by mouth 3 (three) times daily.   hydrOXYzine 25 MG tablet Commonly known as:  ATARAX/VISTARIL Take 1 tablet (25 mg total) by mouth 3 (three) times daily as needed for itching. What changed:  when to take this   ketoconazole 2 % shampoo Commonly known as:  NIZORAL Apply 1 application topically 3 (three) times a week.   midodrine 10 MG tablet Commonly known as:  PROAMATINE Take 10 mg by mouth 3 (three) times daily.   multivitamin Tabs tablet Take 1 tablet by mouth daily.   nitroGLYCERIN 0.4 MG SL tablet  Commonly known as:  NITROSTAT Place 0.4 mg under the tongue every 5 (five) minutes as needed for chest pain (max 3 doses - if no relief call MD).   oxyCODONE 5 MG immediate release tablet Commonly known as:  ROXICODONE Take 1 tablet (5 mg total) every 6 (six) hours as needed by mouth.   OXYGEN Inhale 3 L into the lungs continuous.   oxymetazoline 0.05 % nasal spray Commonly known as:  AFRIN Place 1 spray into both nostrils 2 (two) times daily as needed for congestion.   pantoprazole 40 MG tablet Commonly known as:  PROTONIX Take 1 tablet (40 mg total) by mouth 2 (two)  times daily. What changed:  when to take this   polyethylene glycol packet Commonly known as:  MIRALAX / GLYCOLAX Take 17 g daily as needed by mouth for mild constipation. Mix in 8 oz liquid and drink   predniSONE 20 MG tablet Commonly known as:  DELTASONE Take 3 tablets (60 mg total) by mouth daily with breakfast for 7 days, THEN 2 tablets (40 mg total) daily with breakfast for 7 days, THEN 1 tablet (20 mg total) daily with breakfast for 7 days, THEN 0.5 tablets (10 mg total) daily with breakfast. Start taking on:  09/08/2017   STIOLTO RESPIMAT 2.5-2.5 MCG/ACT Aers Generic drug:  Tiotropium Bromide-Olodaterol INHALE 2 PUFFS BY MOUTH DAILY   triamcinolone cream 0.1 % Commonly known as:  KENALOG Apply 1 application topically daily.       Patient escorted via [place] home.   [name] 09/19/2017 2:39 PM

## 2017-09-19 NOTE — Progress Notes (Signed)
Subjective: Interval History: bps on Hd in 70s despite M[place], alb and no UF.  Objective: Vital signs in last 24 hours: Temp:  [97.2 F (36.2 C)-98 F (36.7 C)] 97.2 F (36.2 C) (01/14 0511) Pulse Rate:  [110-114] 113 (01/14 0511) Resp:  [14-18] 16 (01/14 0511) BP: (64-118)/(35-54) 89/51 (01/14 0516) SpO2:  [91 %-96 %] 96 % (01/14 0511) Weight:  [63 kg (138 lb 14.2 oz)-63.5 kg (139 lb 15.9 oz)] 63.5 kg (139 lb 15.9 oz) (01/13 2026) Weight change:   Intake/Output from previous day: 01/13 0701 - 01/14 0700 In: 550 [P.O.:550] Out: -500  Intake/Output this shift: No intake/output data recorded.  not done  Lab Results: Recent Labs    09/19/17 0536  WBC 12.6*  HGB 7.3*  HCT 22.4*  PLT 58*   BMET:  Recent Labs    09/17/17 0542 09/19/17 0536  NA 132* 131*  K 4.3 3.9  CL 98* 96*  CO2 26 23  GLUCOSE 100* 102*  BUN 34* 36*  CREATININE 3.37* 3.28*  CALCIUM 8.0* 6.8*   No results for input(s): PTH in the last 72 hours. Iron Studies: No results for input(s): IRON, TIBC, TRANSFERRIN, FERRITIN in the last 72 hours.  Studies/Results: No results found.  I have reviewed the patient's current medications.  Assessment/Plan: 1 ESRD/Hypotension  Reviewed with patient that despite vol xs, meds, support still not candidate for outpatient HD because of low bps. Discussed options at this time and just supportive care.  Have not planned further dialysis at this time. Will also speak with wife.  Dr. Justin Mend had the same discussion with them and asked me to appraise and I agree. P as above, 30 min spent    LOS: 2 days   Jeneen Rinks Marquasia Schmieder 09/19/2017,12:26 PM

## 2017-09-19 NOTE — Telephone Encounter (Signed)
Walk In pt [place] paper dropped off by Rhys Martini placed in [facility] doc box.

## 2017-09-19 NOTE — Care Management Note (Addendum)
Case Management Note  Patient Details  Name: [name] MRN: [id number] Date of Birth: 1956-08-23  Subjective/Objective:    History of ESRD Admitted with chronically hypotensive by EMS            Action/Plan: Prior to admission home with spouse. In to speak with patient, wife at bedside.  PCP is Teressa Lower. [facility] in [place], [place]. Home DME: Nebulizer, Rita-no wheels, bedside commode, Bi-pap machine and oxygen (3L via oxymizer).  Prior to admission active with [facility] for oxygen.    Received discharge orders; Referral Called to [place], provided patient information. NCM will continue to follow for discharge transition needs.  Faxed Demographics, H&P and Hospice note to [facility] at [phone].  Wife states they do have oxygen at home already.  Requested bed/mattress be delivered tomorrow 09/20/2017.    09/19/2016 1:25 PM Verified address, Called [facility] for transport. Paperwork given to [employer].   Expected Discharge Plan:  [place]  Discharge planning Services  CM Consult  Status of Service:  In process, will continue to follow  Kristen Cardinal, RN  Nurse Case Manager-Orientation 5W rooms: [phone] 09/19/2017, 10:16 AM

## 2017-09-19 NOTE — Consult Note (Signed)
[place]:  Met with joy pt's wife and spoke to her about our two different programs and how we may help depending on there decision to continue dialysis or stop the treatments all together. She reports to me that the renal MD has stated that unfortunately he is not able to tolerate the HD treatments anymore. She has opted to go home with hospice for her husband and would prefer to use the local hospice agency that is 5 miles from her home. She is familiar with [facility]. I have spoke to [place] and let her know the pt's and family request. Thank you for allowing [place] to assist in the care of this pt.   [employer] Nurse Liaison [phone]

## 2017-09-19 NOTE — Plan of Care (Signed)
Patient tolerated Dialysis last evening with zero fluid pulled from patient D/T hypotension. Patient remains A&OX4 asymptomatic for hypotension. Bed sores cleaned and foam placed for skin protection. Spouse remained with patient this shift. NAD noted.

## 2017-09-19 NOTE — Progress Notes (Signed)
Case management working with hospice and transport.

## 2017-09-19 NOTE — Progress Notes (Addendum)
Subjective:  Patient seen laying comfortably in bed this AM in no acute distress. Patient states he felt good during dialysis session yesterday and was happy he was able to do 3 hours. He inquires about how things will go moving forward. Patient's wife was quiet during interview this AM and states that she knows that his BP was good during dialysis, so that patient was able to complete 3 hours, but knows that they didn't remove any fluid.   Objective:  Vital signs in last 24 hours: Vitals:   09/18/17 2026 09/18/17 2240 09/19/17 0511 09/19/17 0516  BP: (!) 104/42 (!) 74/51 (!) 64/48 (!) 89/51  Pulse: (!) 110 (!) 114 (!) 113   Resp:  16 16   Temp: (!) 97.5 F (36.4 C) 97.9 F (36.6 C) (!) 97.2 F (36.2 C)   TempSrc: Oral Oral Oral   SpO2: 96% 93% 96%   Weight: 139 lb 15.9 oz (63.5 kg)     Height:       Physical Exam  Constitutional:  Chronically sick man who appears older than stated age laying in bed in no acute distress with oxymizer in place.  HENT:  Mouth/Throat: Oropharynx is clear and moist.  Cardiovascular: Normal rate, regular rhythm and intact distal pulses. Exam reveals no friction rub.  No murmur heard. Respiratory:  Patient breathing comfortably on exam without signs of cyanosis. No accessory muscle use or nasal flaring. Bibasilar crackles R>L.   GI: Soft. He exhibits no distension. There is no tenderness.  Musculoskeletal: He exhibits edema (Trace to 1+ pitting edema to knees bilaterally). He exhibits no tenderness (of bilateral lower extremities below the knees).  Skin: Skin is warm and dry.  Hyperpigmentation of lower extremities, unchanged from previous exams   Assessment/Plan:  Principal Problem:   Hypotension of hemodialysis Active Problems:   Thrombocytopenia ([place])   ESRD on dialysis ([place])   Anemia   Hypoxia   COPD GOLD III    CHF (congestive heart failure) ([place])  [name] is a 62yo with PMH of COPD, Lung cancer, HFpEF, CAD status post CABG,  permanent A. Fib, chronic hypotension on midodrine, and ESRD on HD MWF who presented to the ED after becoming severely hypotensive (SBP <60) during outpatient HD. Patient was admitted to the internal medicine teaching service for management with nephrology and palliative care consulting. The specific problems addressed during admission were as follows:  ESRD on HD MSF: Patient did not have any fluid removed after dialysis yesterday. Spoke to nephrologist who suggested patient definitely not a candidate for outpatient dialysis given BP (which was known by family on admission). Patient and family seemed to be awaiting recommendations from nephrologist this AM regarding plan for dialysis moving forward since he did 3 hours yesterday? I think the patient is under the impression that he can continue dialysis somehow? (patient inquired about dialysis at long term care facility on admission). It seems that the patient may be misinterpreting the dialysis session yesterday. His wife seems to think going home with hospice is a good idea and what should be done upon discharge. -Nephrology consulted, assistance appreciated   Chronic hypotension: Patient chronically hypotensive on Midodrine 10 mg TID prior to the current hospitalization. Throughout hospitalization BP soft in 70-90s/40-50s, today 90/50.  -Continue Midodrine 10 mg 3 times daily   Goals of care: As discussed above, patient has multiple, complex medical conditions which have altogether contributed to his progressive decline and repeated hospitalizations. Patient and family were in pursuit of outpatient  palliative care and eventual transition to hospice care if patient couldn't tolerate dialysis outside of the hosptial. The recent admission and dialysis session yesterday suggest dialysis no longer an option - I don't think patient would be candidate for inpatient dialysis at long term care facility, but need clarification from nephrology team for patient as  stated above. [facility] [place] contacted on admission, will plan to see patient today. -Palliative care consulted, recommendations appreciated   COPD: Patient had recent hospitalization with treatment for COPD exacerbation with IV Levaquin and steroids. Initiated on prednisone taper on discharge that will be continued during hospitalization.  -Prednisone 40 mg daily -Continue Oxymizer as needed  Persistent A Fib: Patient chronically with HR >100 on amiodarone 200 mg daily 2/2 failed ablation and multiple drug therapies. Currently in 110s and asymptomatic.  -Continue amiodarone 200 mg daily  CAD status post CABG: Patient's atorvastatin stopped by his cardiologist during outpatient visit on 09/15/2017 given that patient is at end of life.  FEN/GI: -Renal Diet -No IVF, replace electrolytes as needed -Protonix 40 mg BID  VTE prophylaxis: SubQ heparin, INR pending CODE STATUS: DNR  Dispo: Anticipated discharge in approximately 1-2 day(s).   Thomasene Ripple, MD 09/19/2017, 7:47 AM Pager: [phone]

## 2017-09-20 ENCOUNTER — Ambulatory Visit: Payer: Medicare Other | Admitting: Internal Medicine

## 2017-09-21 LAB — CULTURE, BLOOD (ROUTINE X 2)
CULTURE: NO GROWTH
Culture: NO GROWTH
SPECIAL REQUESTS: ADEQUATE
SPECIAL REQUESTS: ADEQUATE

## 2017-10-07 DEATH — deceased

## 2017-11-08 NOTE — Progress Notes (Signed)
Internal Medicine Attending:   I saw and examined the patient. I reviewed the resident's note and I agree with the resident's findings and plan as documented in the resident's note.  

## 2018-03-16 IMAGING — CR DG CHEST 2V
2 series · 2 of 2 positions shown · non-contrast
Comparison: Portable chest x-ray July 09, 2016

CLINICAL DATA: Shortness of breath for the past week with mild
chest congestion and cough. The patient also notes right lower
anterior chest wall discomfort following leaning over a chair and
feeling a pop.

EXAM:
CHEST  2 VIEW

[w chest pa]
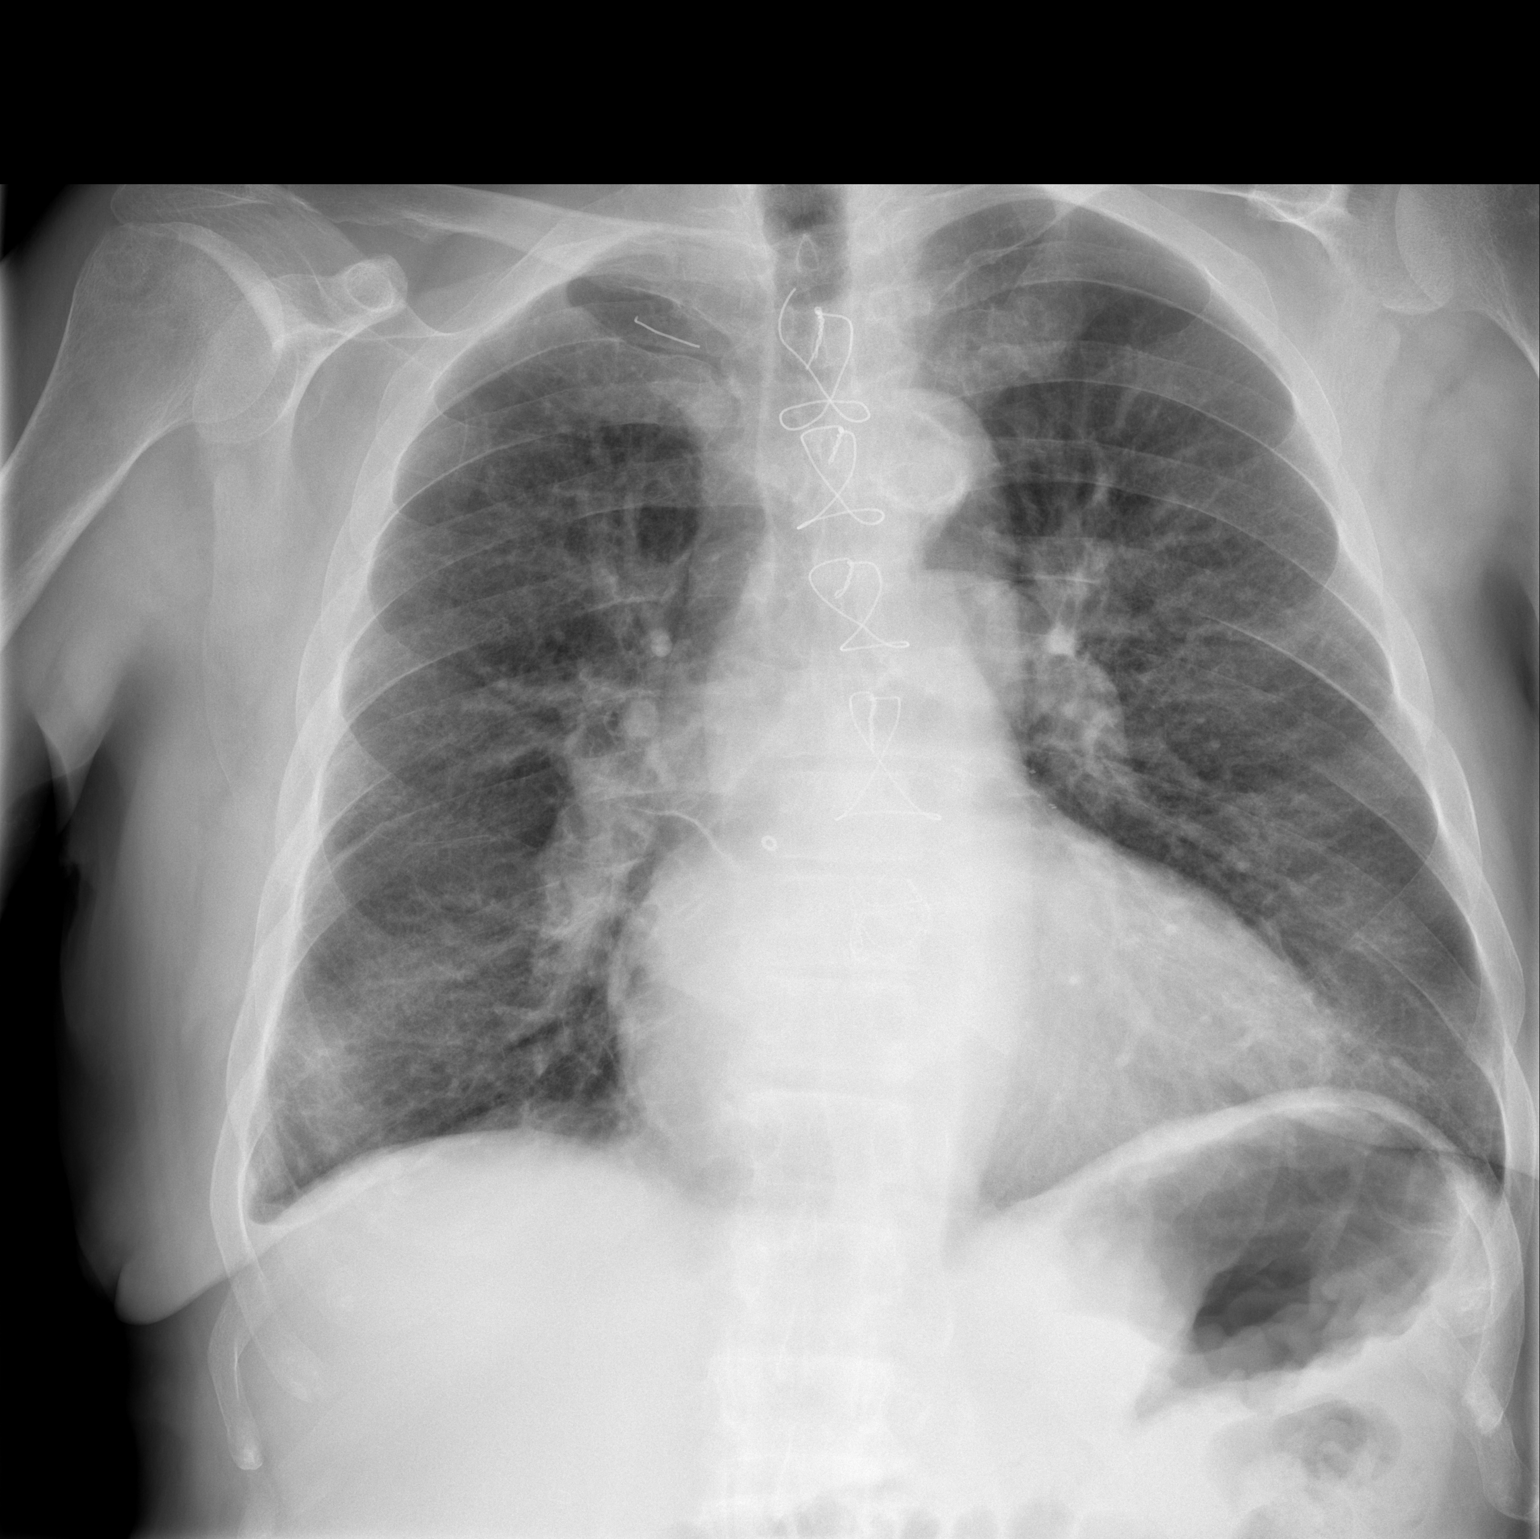

[w chest lat]
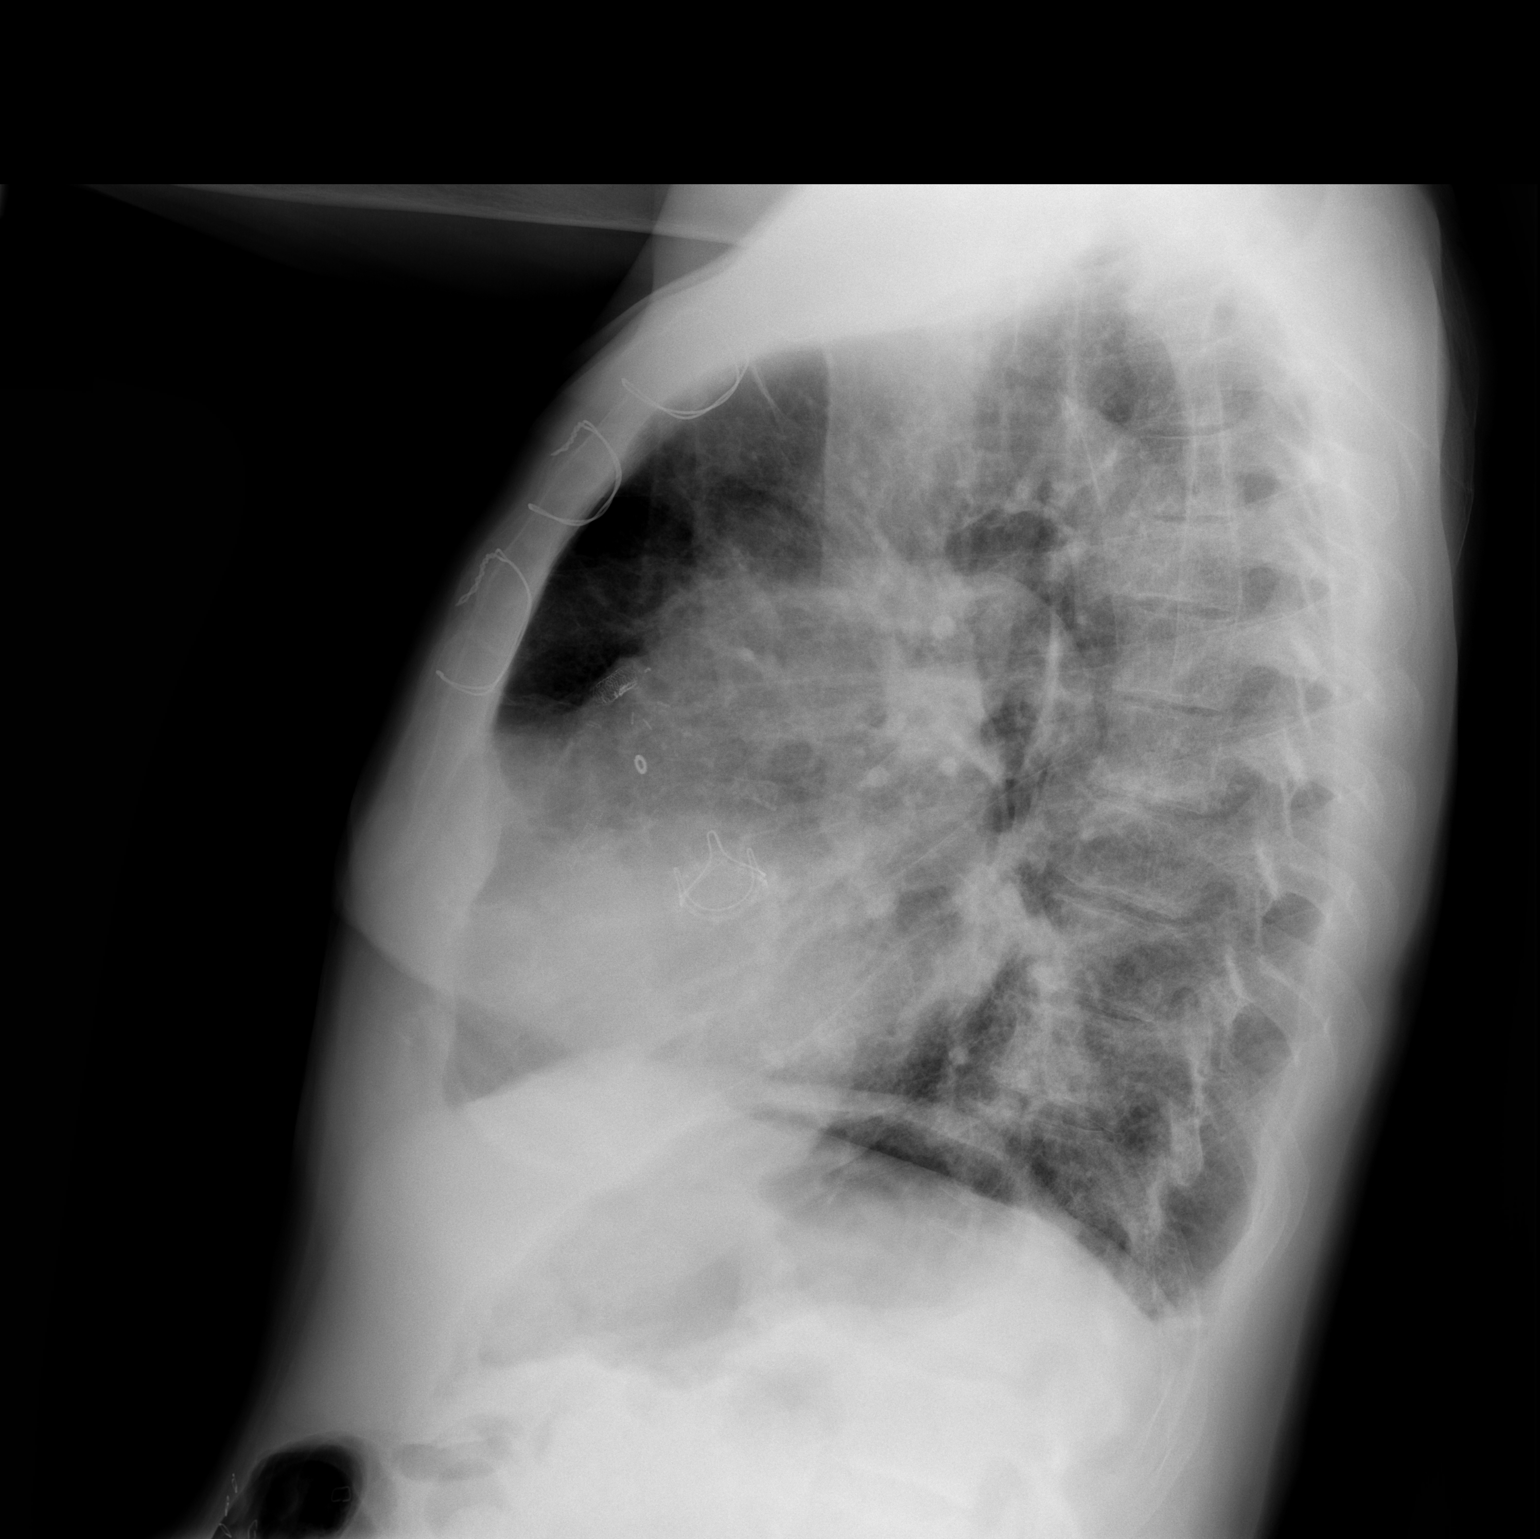

[2 of 2 positions shown; findings below may reference images not displayed]

FINDINGS: The lungs are well-expanded. The interstitial markings are coarse
but less conspicuous than on the previous study. Tiny amounts of
pleural fluid blunt the costophrenic angles and are stable. The
cardiac silhouette is mildly enlarged. The pulmonary vascularity is
prominent centrally but less engorged than on the previous study.
There is calcification in the wall of the aortic arch. The bony
thorax exhibits no acute abnormality. The patient has undergone
previous aortic valve replacement as well as CABG.
IMPRESSION: Mild CHF superimposed upon COPD. The CHF has improved since the
previous study. No pneumonia nor pneumothorax nor large pleural
effusion. No definite rib abnormality.

## 2018-07-17 IMAGING — CR DG CHEST 2V
2 series · 2 of 2 positions shown · non-contrast
Comparison: 11/15/2016

CLINICAL DATA: Worsening dyspnea for 2 days.

EXAM:
CHEST  2 VIEW

[chest pa]
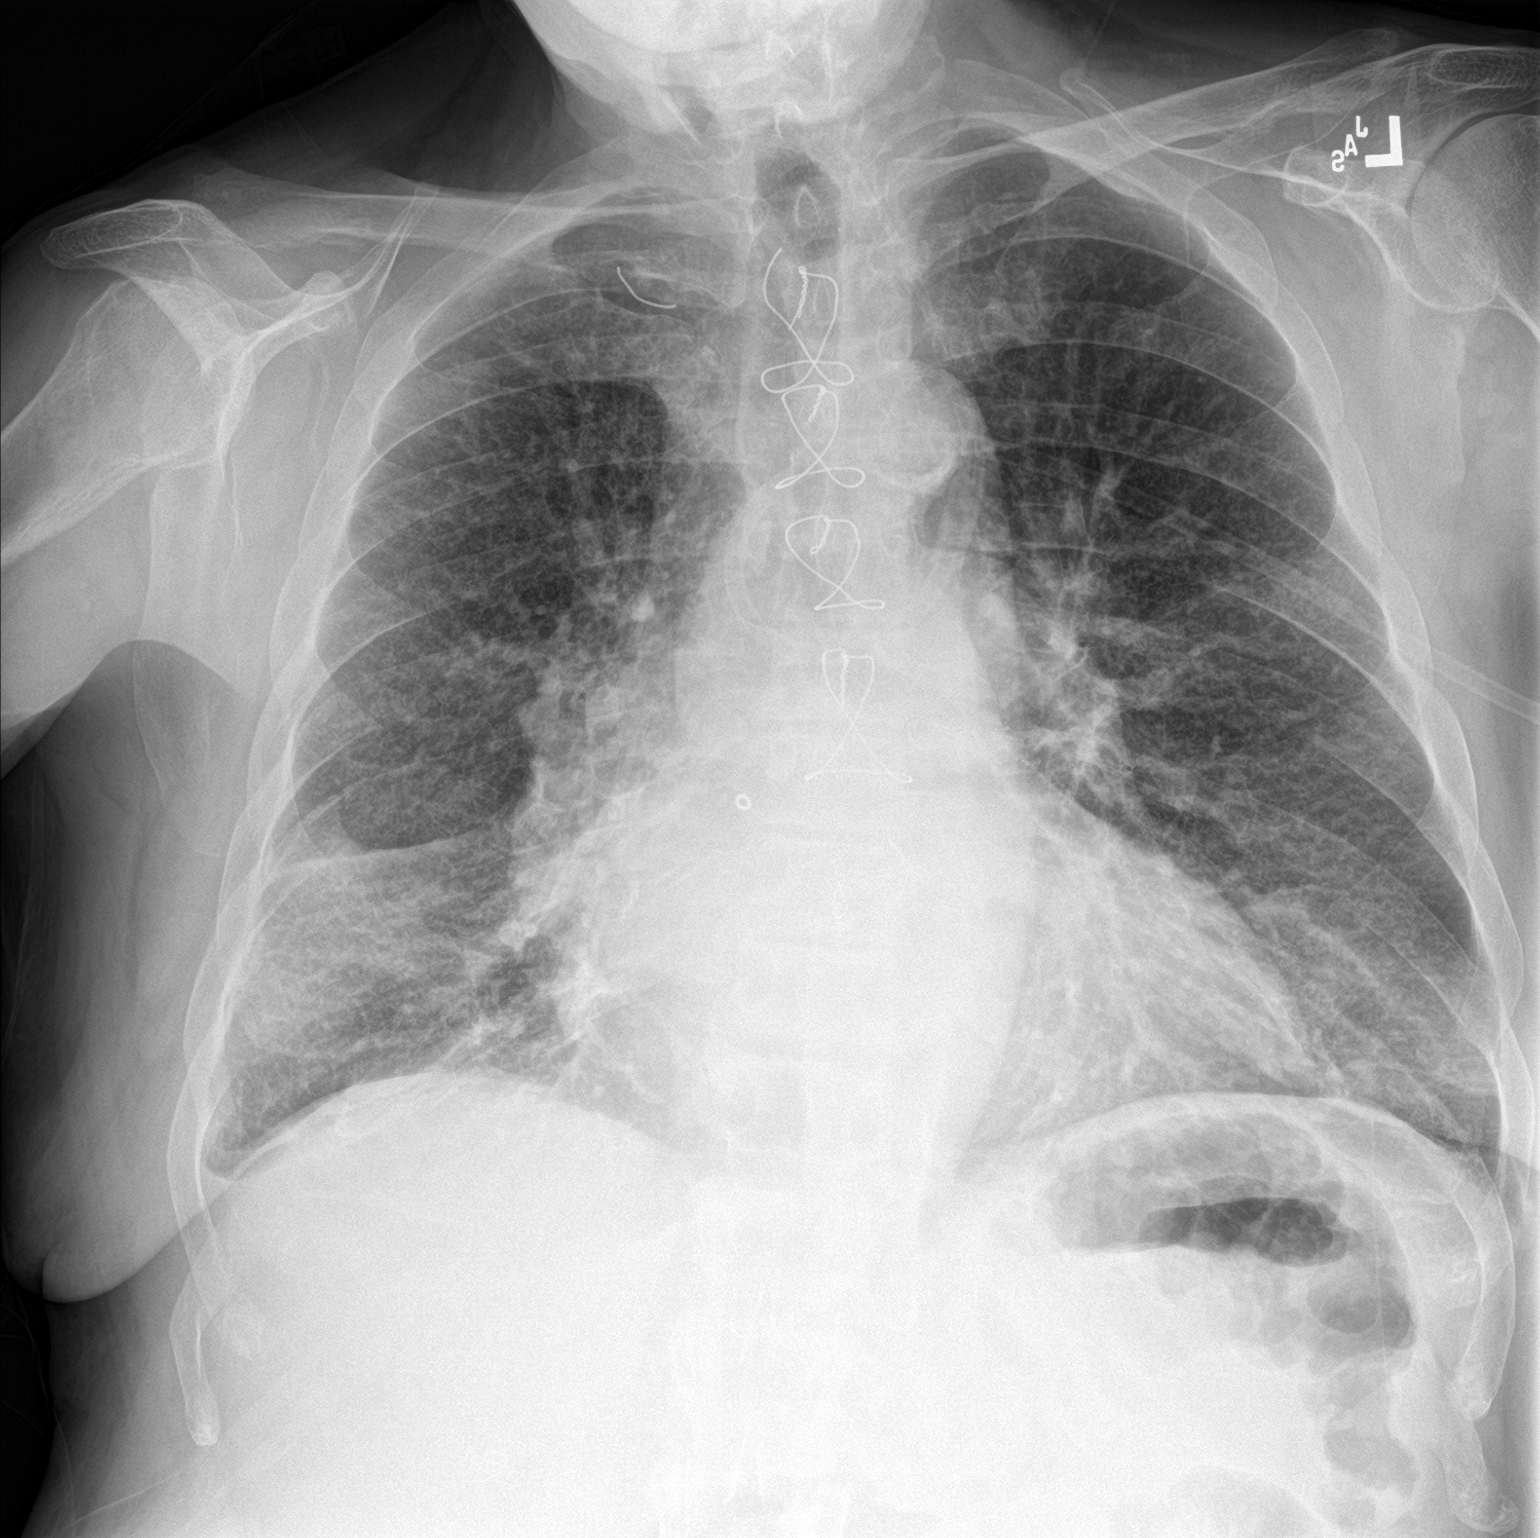

[chest lat]
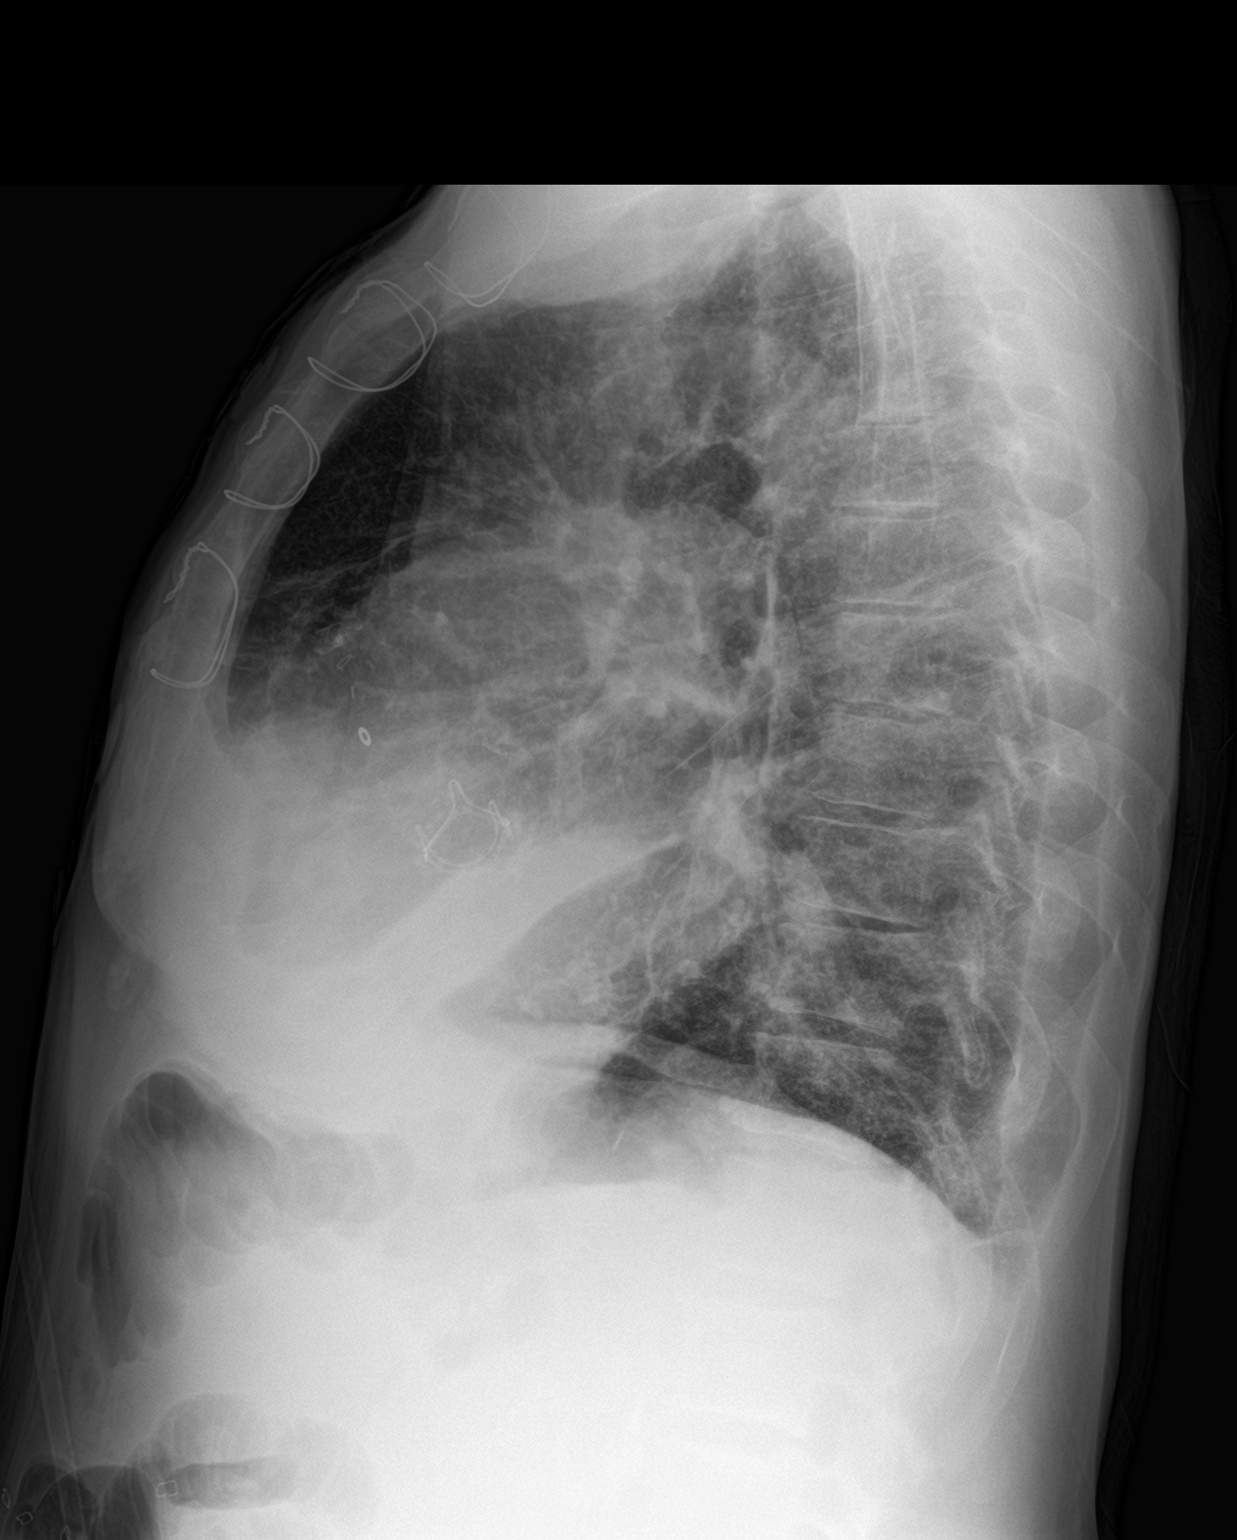

[2 of 2 positions shown; findings below may reference images not displayed]

FINDINGS: Extensive interstitial fluid or thickening. No confluent airspace
consolidation. Small right effusion, extending into the fissures.

Stable cardiomegaly.  Prior sternotomy and aortic valvuloplasty.
IMPRESSION: Extensive interstitial fluid and small right pleural effusion. No
focal airspace consolidation. Stable cardiomegaly.

## 2018-07-18 IMAGING — DX DG CHEST 1V PORT
1 series · 1 of 1 positions shown · non-contrast
Comparison: PA and lateral chest x-ray December 05, 2016

CLINICAL DATA: Follow-up pulmonary edema

EXAM:
PORTABLE CHEST 1 VIEW

[chest ap]
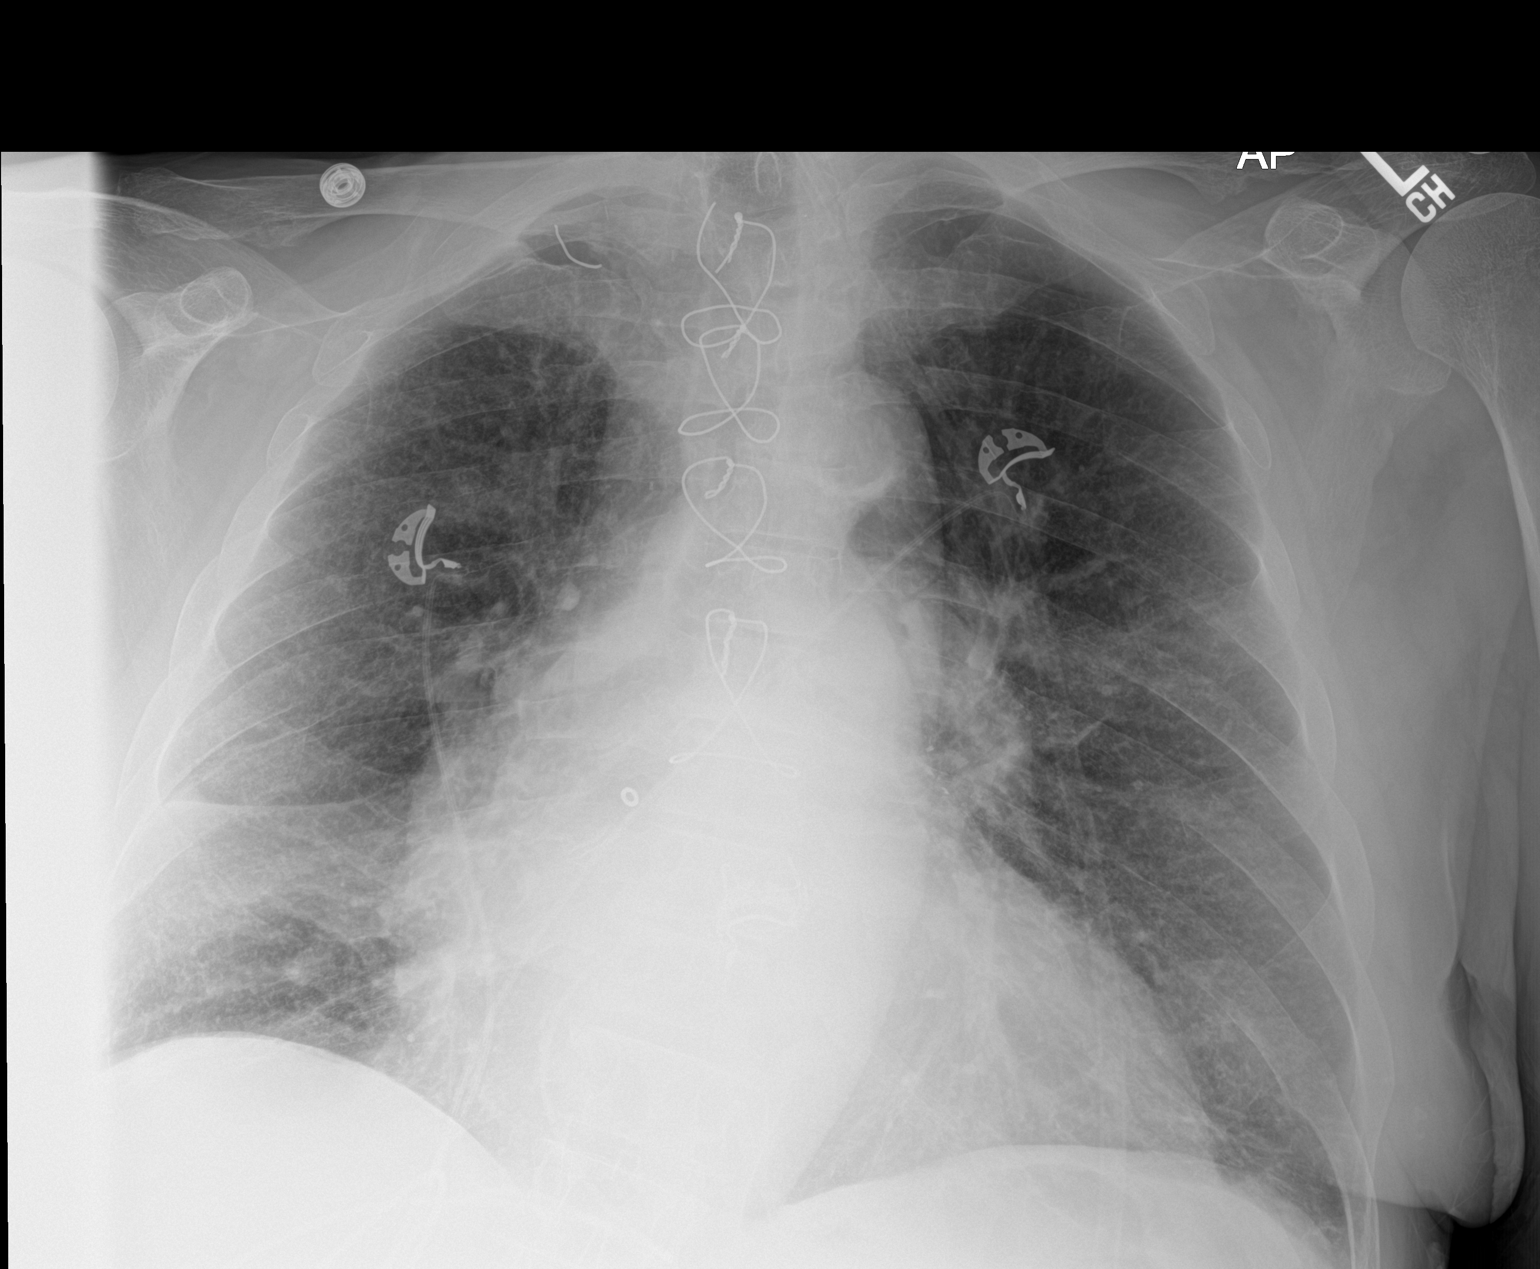

[1 of 1 positions shown; findings below may reference images not displayed]

FINDINGS: The lungs are well-expanded. The interstitial markings remain
prominent but are less conspicuous today. There is a small amount of
fluid in the minor fissure. The cardiac silhouette remains enlarged.
The pulmonary vascularity remains engorged. There is calcification
in the wall of the aortic arch. The patient has undergone previous
median sternotomy. Curvature of the lower thoracic spine is new
since yesterday's study and likely reflects patient positioning.
IMPRESSION: Mild improvement in pulmonary interstitial edema. No alveolar
pneumonia. Stable cardiomegaly with central pulmonary vascular
congestion.

Thoracic aortic atherosclerosis.
